# Patient Record
Sex: Male | Born: 1950 | Race: White | Hispanic: No | Smoking: Former smoker
Health system: Southern US, Community
[De-identification: ages and names within clinical notes are randomized; demographics above are authoritative.]

## PROBLEM LIST (undated history)

## (undated) DIAGNOSIS — M109 Gout, unspecified: Secondary | ICD-10-CM

## (undated) DIAGNOSIS — E559 Vitamin D deficiency, unspecified: Secondary | ICD-10-CM

## (undated) DIAGNOSIS — G629 Polyneuropathy, unspecified: Secondary | ICD-10-CM

## (undated) DIAGNOSIS — E1140 Type 2 diabetes mellitus with diabetic neuropathy, unspecified: Secondary | ICD-10-CM

## (undated) DIAGNOSIS — K5790 Diverticulosis of intestine, part unspecified, without perforation or abscess without bleeding: Secondary | ICD-10-CM

## (undated) DIAGNOSIS — I2510 Atherosclerotic heart disease of native coronary artery without angina pectoris: Secondary | ICD-10-CM

## (undated) DIAGNOSIS — E119 Type 2 diabetes mellitus without complications: Secondary | ICD-10-CM

## (undated) DIAGNOSIS — N189 Chronic kidney disease, unspecified: Secondary | ICD-10-CM

## (undated) DIAGNOSIS — E7800 Pure hypercholesterolemia, unspecified: Secondary | ICD-10-CM

## (undated) DIAGNOSIS — R011 Cardiac murmur, unspecified: Secondary | ICD-10-CM

## (undated) DIAGNOSIS — E1142 Type 2 diabetes mellitus with diabetic polyneuropathy: Secondary | ICD-10-CM

## (undated) DIAGNOSIS — N320 Bladder-neck obstruction: Secondary | ICD-10-CM

## (undated) DIAGNOSIS — E785 Hyperlipidemia, unspecified: Secondary | ICD-10-CM

## (undated) DIAGNOSIS — D369 Benign neoplasm, unspecified site: Secondary | ICD-10-CM

## (undated) DIAGNOSIS — I10 Essential (primary) hypertension: Secondary | ICD-10-CM

## (undated) DIAGNOSIS — E781 Pure hyperglyceridemia: Secondary | ICD-10-CM

## (undated) HISTORY — PX: CATARACT EXTRACTION, BILATERAL: SHX1313

## (undated) HISTORY — PX: KNEE ARTHROSCOPY: SHX127

---

## 2010-12-15 ENCOUNTER — Emergency Department: Payer: Self-pay | Admitting: Emergency Medicine

## 2010-12-16 ENCOUNTER — Ambulatory Visit: Payer: Self-pay | Admitting: Oncology

## 2010-12-16 ENCOUNTER — Inpatient Hospital Stay: Payer: Self-pay | Admitting: Internal Medicine

## 2010-12-17 LAB — AFP TUMOR MARKER: AFP-Tumor Marker: <0.7 ng/mL

## 2010-12-17 LAB — CEA: CEA: 3.1 ng/mL

## 2010-12-19 LAB — CANCER ANTIGEN 19-9: CA 19-9: 8 U/mL (ref 0–35)

## 2010-12-31 ENCOUNTER — Ambulatory Visit: Payer: Self-pay | Admitting: Oncology

## 2011-01-02 ENCOUNTER — Other Ambulatory Visit: Payer: Self-pay | Admitting: Family Medicine

## 2011-01-03 ENCOUNTER — Ambulatory Visit: Payer: Self-pay | Admitting: Specialist

## 2011-01-17 ENCOUNTER — Ambulatory Visit: Payer: Self-pay | Admitting: Specialist

## 2011-02-15 ENCOUNTER — Ambulatory Visit: Payer: Self-pay | Admitting: Specialist

## 2011-03-27 ENCOUNTER — Ambulatory Visit: Payer: Self-pay | Admitting: Specialist

## 2012-07-19 ENCOUNTER — Ambulatory Visit: Payer: Self-pay | Admitting: Internal Medicine

## 2012-07-30 ENCOUNTER — Ambulatory Visit: Payer: Self-pay | Admitting: Family Medicine

## 2012-08-29 DIAGNOSIS — Z951 Presence of aortocoronary bypass graft: Secondary | ICD-10-CM

## 2012-08-29 HISTORY — DX: Presence of aortocoronary bypass graft: Z95.1

## 2012-08-29 HISTORY — PX: CORONARY ARTERY BYPASS GRAFT: SHX141

## 2012-09-17 ENCOUNTER — Ambulatory Visit: Payer: Self-pay | Admitting: Internal Medicine

## 2012-09-17 DIAGNOSIS — I2510 Atherosclerotic heart disease of native coronary artery without angina pectoris: Secondary | ICD-10-CM | POA: Insufficient documentation

## 2012-09-17 DIAGNOSIS — E782 Mixed hyperlipidemia: Secondary | ICD-10-CM | POA: Insufficient documentation

## 2012-09-17 LAB — GLUCOSE, RANDOM: Glucose: 402 mg/dL — ABNORMAL HIGH (ref 65–99)

## 2012-10-08 ENCOUNTER — Encounter: Payer: Self-pay | Admitting: Internal Medicine

## 2012-10-29 ENCOUNTER — Encounter: Payer: Self-pay | Admitting: Internal Medicine

## 2014-09-14 DIAGNOSIS — I10 Essential (primary) hypertension: Secondary | ICD-10-CM | POA: Insufficient documentation

## 2015-04-15 ENCOUNTER — Other Ambulatory Visit: Payer: Self-pay | Admitting: Physician Assistant

## 2015-05-31 ENCOUNTER — Inpatient Hospital Stay
Admission: AD | Admit: 2015-05-31 | Discharge: 2015-06-03 | DRG: 617 | Disposition: A | Discharge status: Home / Self Care | Payer: Managed Care, Other (non HMO) | Source: Ambulatory Visit | Attending: Specialist | Admitting: Specialist

## 2015-05-31 ENCOUNTER — Other Ambulatory Visit
Admission: RE | Admit: 2015-05-31 | Discharge: 2015-05-31 | Disposition: A | Discharge status: Home / Self Care | Payer: Managed Care, Other (non HMO) | Source: Ambulatory Visit | Attending: Podiatry | Admitting: Podiatry

## 2015-05-31 ENCOUNTER — Inpatient Hospital Stay: Payer: Managed Care, Other (non HMO)

## 2015-05-31 DIAGNOSIS — L97514 Non-pressure chronic ulcer of other part of right foot with necrosis of bone: Secondary | ICD-10-CM | POA: Diagnosis present

## 2015-05-31 DIAGNOSIS — Z9119 Patient's noncompliance with other medical treatment and regimen: Secondary | ICD-10-CM

## 2015-05-31 DIAGNOSIS — Z7982 Long term (current) use of aspirin: Secondary | ICD-10-CM

## 2015-05-31 DIAGNOSIS — Z951 Presence of aortocoronary bypass graft: Secondary | ICD-10-CM | POA: Diagnosis not present

## 2015-05-31 DIAGNOSIS — E1142 Type 2 diabetes mellitus with diabetic polyneuropathy: Secondary | ICD-10-CM | POA: Diagnosis present

## 2015-05-31 DIAGNOSIS — I10 Essential (primary) hypertension: Secondary | ICD-10-CM | POA: Diagnosis present

## 2015-05-31 DIAGNOSIS — Z794 Long term (current) use of insulin: Secondary | ICD-10-CM

## 2015-05-31 DIAGNOSIS — E1140 Type 2 diabetes mellitus with diabetic neuropathy, unspecified: Secondary | ICD-10-CM | POA: Diagnosis present

## 2015-05-31 DIAGNOSIS — E559 Vitamin D deficiency, unspecified: Secondary | ICD-10-CM | POA: Diagnosis present

## 2015-05-31 DIAGNOSIS — M868X7 Other osteomyelitis, ankle and foot: Secondary | ICD-10-CM | POA: Insufficient documentation

## 2015-05-31 DIAGNOSIS — M869 Osteomyelitis, unspecified: Secondary | ICD-10-CM

## 2015-05-31 DIAGNOSIS — E11621 Type 2 diabetes mellitus with foot ulcer: Secondary | ICD-10-CM | POA: Diagnosis present

## 2015-05-31 DIAGNOSIS — E785 Hyperlipidemia, unspecified: Secondary | ICD-10-CM | POA: Diagnosis present

## 2015-05-31 DIAGNOSIS — I2510 Atherosclerotic heart disease of native coronary artery without angina pectoris: Secondary | ICD-10-CM | POA: Diagnosis present

## 2015-05-31 DIAGNOSIS — Z833 Family history of diabetes mellitus: Secondary | ICD-10-CM | POA: Diagnosis not present

## 2015-05-31 DIAGNOSIS — Z8739 Personal history of other diseases of the musculoskeletal system and connective tissue: Secondary | ICD-10-CM | POA: Insufficient documentation

## 2015-05-31 DIAGNOSIS — Z79899 Other long term (current) drug therapy: Secondary | ICD-10-CM

## 2015-05-31 DIAGNOSIS — N1832 Chronic kidney disease, stage 3b: Secondary | ICD-10-CM | POA: Insufficient documentation

## 2015-05-31 DIAGNOSIS — E1169 Type 2 diabetes mellitus with other specified complication: Principal | ICD-10-CM | POA: Diagnosis present

## 2015-05-31 DIAGNOSIS — L97519 Non-pressure chronic ulcer of other part of right foot with unspecified severity: Secondary | ICD-10-CM | POA: Diagnosis present

## 2015-05-31 DIAGNOSIS — Z8249 Family history of ischemic heart disease and other diseases of the circulatory system: Secondary | ICD-10-CM | POA: Diagnosis not present

## 2015-05-31 HISTORY — DX: Atherosclerotic heart disease of native coronary artery without angina pectoris: I25.10

## 2015-05-31 HISTORY — DX: Essential (primary) hypertension: I10

## 2015-05-31 HISTORY — DX: Type 2 diabetes mellitus without complications: E11.9

## 2015-05-31 HISTORY — DX: Gout, unspecified: M10.9

## 2015-05-31 HISTORY — DX: Vitamin D deficiency, unspecified: E55.9

## 2015-05-31 HISTORY — DX: Hyperlipidemia, unspecified: E78.5

## 2015-05-31 HISTORY — DX: Personal history of other diseases of the musculoskeletal system and connective tissue: Z87.39

## 2015-05-31 HISTORY — DX: Type 2 diabetes mellitus with diabetic neuropathy, unspecified: E11.40

## 2015-05-31 HISTORY — DX: Osteomyelitis, unspecified: M86.9

## 2015-05-31 LAB — COMPREHENSIVE METABOLIC PANEL
ALT: 18 U/L (ref 17–63)
ANION GAP: 10 (ref 5–15)
AST: 20 U/L (ref 15–41)
Albumin: 4.2 g/dL (ref 3.5–5.0)
Alkaline Phosphatase: 76 U/L (ref 38–126)
BILIRUBIN TOTAL: 0.6 mg/dL (ref 0.3–1.2)
BUN: 18 mg/dL (ref 6–20)
CALCIUM: 9 mg/dL (ref 8.9–10.3)
CO2: 24 mmol/L (ref 22–32)
Chloride: 103 mmol/L (ref 101–111)
Creatinine, Ser: 1.34 mg/dL — ABNORMAL HIGH (ref 0.61–1.24)
GFR calc non Af Amer: 54 mL/min — ABNORMAL LOW (ref >60)
Glucose, Bld: 96 mg/dL (ref 65–99)
Potassium: 3.7 mmol/L (ref 3.5–5.1)
Sodium: 137 mmol/L (ref 135–145)
TOTAL PROTEIN: 8.2 g/dL — AB (ref 6.5–8.1)

## 2015-05-31 LAB — CBC
HCT: 36.2 % — ABNORMAL LOW (ref 40.0–52.0)
Hemoglobin: 12.2 g/dL — ABNORMAL LOW (ref 13.0–18.0)
MCH: 28.9 pg (ref 26.0–34.0)
MCHC: 33.7 g/dL (ref 32.0–36.0)
MCV: 85.7 fL (ref 80.0–100.0)
PLATELETS: 252 10*3/uL (ref 150–440)
RBC: 4.22 MIL/uL — AB (ref 4.40–5.90)
RDW: 13.6 % (ref 11.5–14.5)
WBC: 10.4 10*3/uL (ref 3.8–10.6)

## 2015-05-31 LAB — MRSA PCR SCREENING: MRSA BY PCR: NEGATIVE

## 2015-05-31 LAB — GLUCOSE, CAPILLARY
GLUCOSE-CAPILLARY: 97 mg/dL (ref 65–99)
GLUCOSE-CAPILLARY: 268 mg/dL — AB (ref 65–99)

## 2015-05-31 MED ORDER — TRAZODONE HCL 50 MG PO TABS
50.0000 mg | ORAL_TABLET | Freq: Every evening | ORAL | Status: DC | PRN
  Administered 2015-05-31 – 2015-06-01 (×2): 50 mg via ORAL
  Filled 2015-05-31 (×2): qty 1

## 2015-05-31 MED ORDER — DOCUSATE SODIUM 100 MG PO CAPS
100.0000 mg | ORAL_CAPSULE | Freq: Two times a day (BID) | ORAL | Status: DC
  Administered 2015-05-31 – 2015-06-03 (×6): 100 mg via ORAL
  Filled 2015-05-31 (×7): qty 1

## 2015-05-31 MED ORDER — PRAVASTATIN SODIUM 20 MG PO TABS
80.0000 mg | ORAL_TABLET | Freq: Every day | ORAL | Status: DC
  Administered 2015-05-31 – 2015-06-02 (×3): 80 mg via ORAL
  Filled 2015-05-31 (×3): qty 4

## 2015-05-31 MED ORDER — LISINOPRIL 5 MG PO TABS
5.0000 mg | ORAL_TABLET | Freq: Every day | ORAL | Status: DC
  Administered 2015-05-31 – 2015-06-03 (×4): 5 mg via ORAL
  Filled 2015-05-31 (×4): qty 1

## 2015-05-31 MED ORDER — ONDANSETRON HCL 4 MG/2ML IJ SOLN: 4.0000 mg | Freq: Four times a day (QID) | INTRAMUSCULAR | Status: DC | PRN

## 2015-05-31 MED ORDER — INSULIN ASPART 100 UNIT/ML ~~LOC~~ SOLN
0.0000 [IU] | Freq: Every day | SUBCUTANEOUS | Status: DC
  Administered 2015-05-31 – 2015-06-02 (×3): 3 [IU] via SUBCUTANEOUS
  Filled 2015-05-31: qty 2
  Filled 2015-05-31 (×3): qty 3

## 2015-05-31 MED ORDER — PIPERACILLIN-TAZOBACTAM 3.375 G IVPB
3.3750 g | Freq: Three times a day (TID) | INTRAVENOUS | Status: DC
  Administered 2015-05-31 – 2015-06-03 (×8): 3.375 g via INTRAVENOUS
  Filled 2015-05-31 (×11): qty 50

## 2015-05-31 MED ORDER — VANCOMYCIN HCL 10 G IV SOLR
1250.0000 mg | Freq: Two times a day (BID) | INTRAVENOUS | Status: DC
  Administered 2015-06-01 – 2015-06-02 (×3): 1250 mg via INTRAVENOUS
  Filled 2015-05-31 (×4): qty 1250

## 2015-05-31 MED ORDER — VITAMIN D 1000 UNITS PO TABS
2000.0000 [IU] | ORAL_TABLET | Freq: Every day | ORAL | Status: DC
Start: 2015-05-31 — End: 2015-06-03
  Administered 2015-05-31 – 2015-06-03 (×3): 2000 [IU] via ORAL
  Filled 2015-05-31 (×4): qty 2

## 2015-05-31 MED ORDER — VANCOMYCIN HCL 10 G IV SOLR
1250.0000 mg | Freq: Once | INTRAVENOUS | Status: AC
  Administered 2015-06-01: 1250 mg via INTRAVENOUS
  Filled 2015-05-31: qty 1250

## 2015-05-31 MED ORDER — ONDANSETRON HCL 4 MG PO TABS: 4.0000 mg | ORAL_TABLET | Freq: Four times a day (QID) | ORAL | Status: DC | PRN

## 2015-05-31 MED ORDER — INSULIN GLARGINE 100 UNIT/ML ~~LOC~~ SOLN
26.0000 [IU] | Freq: Every day | SUBCUTANEOUS | Status: DC
  Administered 2015-05-31 – 2015-06-01 (×2): 26 [IU] via SUBCUTANEOUS
  Filled 2015-05-31 (×3): qty 0.26

## 2015-05-31 MED ORDER — VANCOMYCIN HCL IN DEXTROSE 1-5 GM/200ML-% IV SOLN
1000.0000 mg | Freq: Once | INTRAVENOUS | Status: AC
  Administered 2015-05-31: 1000 mg via INTRAVENOUS
  Filled 2015-05-31: qty 200

## 2015-05-31 MED ORDER — HYDROCODONE-ACETAMINOPHEN 5-325 MG PO TABS
1.0000 | ORAL_TABLET | ORAL | Status: DC | PRN
  Administered 2015-06-01 (×2): 1 via ORAL
  Administered 2015-06-02 (×2): 2 via ORAL
  Administered 2015-06-02: 1 via ORAL
  Administered 2015-06-02 (×2): 2 via ORAL
  Filled 2015-05-31: qty 1
  Filled 2015-05-31 (×3): qty 2
  Filled 2015-05-31 (×2): qty 1
  Filled 2015-05-31: qty 2

## 2015-05-31 MED ORDER — ACETAMINOPHEN 325 MG PO TABS: 650.0000 mg | ORAL_TABLET | Freq: Four times a day (QID) | ORAL | Status: DC | PRN

## 2015-05-31 MED ORDER — POLYETHYLENE GLYCOL 3350 17 G PO PACK
17.0000 g | PACK | Freq: Every day | ORAL | Status: DC | PRN
  Administered 2015-06-02: 17 g via ORAL
  Filled 2015-05-31: qty 1

## 2015-05-31 MED ORDER — HEPARIN SODIUM (PORCINE) 5000 UNIT/ML IJ SOLN
5000.0000 [IU] | Freq: Three times a day (TID) | INTRAMUSCULAR | Status: DC
  Administered 2015-05-31: 5000 [IU] via SUBCUTANEOUS
  Filled 2015-05-31: qty 1

## 2015-05-31 MED ORDER — ACETAMINOPHEN 650 MG RE SUPP: 650.0000 mg | Freq: Four times a day (QID) | RECTAL | Status: DC | PRN

## 2015-05-31 MED ORDER — INSULIN ASPART 100 UNIT/ML ~~LOC~~ SOLN
0.0000 [IU] | Freq: Three times a day (TID) | SUBCUTANEOUS | Status: DC
  Administered 2015-06-01 – 2015-06-02 (×4): 3 [IU] via SUBCUTANEOUS
  Administered 2015-06-02: 2 [IU] via SUBCUTANEOUS
  Administered 2015-06-03: 3 [IU] via SUBCUTANEOUS
  Filled 2015-05-31 (×2): qty 3
  Filled 2015-05-31: qty 5
  Filled 2015-05-31: qty 3

## 2015-05-31 MED ORDER — GABAPENTIN 300 MG PO CAPS
300.0000 mg | ORAL_CAPSULE | Freq: Three times a day (TID) | ORAL | Status: DC
  Administered 2015-05-31 – 2015-06-03 (×6): 300 mg via ORAL
  Filled 2015-05-31 (×6): qty 1

## 2015-05-31 NOTE — Progress Notes (Signed)
Spoke with Dr. Elvina Mattes because Dr. Tressia Miners ordered a wound culture and the patient states that Dr. Elvina Mattes cultured his foot today in the clinic. Dr. Elvina Mattes confirmed this and states order for culture should be discontinued.

## 2015-05-31 NOTE — H&P (Signed)
[place] [facility] [place] NAME: Andrew Harding    MR#:  [id number]  [place] OF BIRTH:  [birth year]  DATE OF ADMISSION:  05/31/2015  PRIMARY CARE PHYSICIAN: Tama High III, MD   REQUESTING/REFERRING PHYSICIAN: Dr. Albertine Patricia  CHIEF COMPLAINT:   Right foot ulcer  HISTORY OF PRESENT ILLNESS:  Andrew Harding  is a 65 y.o. male with a known history of insulin-dependent diabetes mellitus, coronary artery disease status post CABG, peripheral neuropathy, hypertension presents from podiatry office secondary to nonhealing medial right first toe ulcer. Patient states that initially it started as a blister several months ago, he was treated with outpatient antibiotics and slowly the toe was swollen and now the blister ruptured and he has a deep puncture wound. He has been seeing podiatry frequently for that, however it was not healing. Denies any fevers or chills. No discharge coming out from the wound. He does have minimal pain but cannot feel much pain due to neuropathy. He was also evaluated by vascular and his ankle-brachial indexes were fine recently. X-ray done at the office today showed possible osteomyelitis and so patient was sent for admission.  PAST MEDICAL HISTORY:   Past Medical History  Diagnosis Date  . Diabetes mellitus without complication ([place])   . Coronary artery disease   . Diabetic neuropathy ([place])   . Hypertension   . Gout   . Hyperlipidemia   . Vitamin D deficiency     PAST SURGICAL HISTORY:   Past Surgical History  Procedure Laterality Date  . Coronary artery bypass graft    . Knee arthroscopy      Left knee   . Cataract extraction, bilateral      SOCIAL HISTORY:   Social History  Substance Use Topics  . Smoking status: Never Smoker   . Smokeless tobacco: Not on file  . Alcohol Use: No    FAMILY HISTORY:   Family History  Problem Relation Age of Onset  . Diabetes Mellitus II Mother   . CAD Mother     DRUG  ALLERGIES:  No Known Allergies  REVIEW OF SYSTEMS:   Review of Systems  Constitutional: Negative for fever, chills, weight loss and malaise/fatigue.  HENT: Negative for ear discharge, ear pain, nosebleeds and tinnitus.   Eyes: Negative for blurred vision, double vision and photophobia.  Respiratory: Negative for cough, hemoptysis, shortness of breath and wheezing.   Cardiovascular: Negative for chest pain, palpitations, orthopnea and leg swelling.  Gastrointestinal: Negative for heartburn, nausea, vomiting, abdominal pain, diarrhea, constipation and melena.  Genitourinary: Negative for dysuria, urgency, frequency and hematuria.  Musculoskeletal: Positive for joint pain. Negative for myalgias, back pain and neck pain.  Skin: Positive for rash.       Right medial first toe ulcer  Neurological: Positive for sensory change. Negative for dizziness, tremors, speech change, focal weakness and headaches.       Bilateral peripheral neuropathy present in feet.  Endo/Heme/Allergies: Does not bruise/bleed easily.  Psychiatric/Behavioral: Negative for depression.    MEDICATIONS AT HOME:   Prior to Admission medications   Medication Sig Start Date End Date Taking? Authorizing Provider  aspirin EC 81 MG tablet Take 81 mg by mouth daily.   Yes Historical Provider, MD  cholecalciferol (VITAMIN D) 1000 units tablet Take 2,000 Units by mouth daily.   Yes Historical Provider, MD  gabapentin (NEURONTIN) 300 MG capsule Take 300 mg by mouth 3 (three) times daily.   Yes Historical Provider, MD  insulin glargine (LANTUS) 100 UNIT/ML injection Inject 52 Units into the skin at bedtime.   Yes Historical Provider, MD  insulin lispro (HUMALOG) 100 UNIT/ML injection Inject 15-20 Units into the skin 3 (three) times daily with meals. Pt uses 15 units with breakfast, 18 units with lunch, and 20 units with dinner.   Yes Historical Provider, MD  lisinopril (PRINIVIL,ZESTRIL) 5 MG tablet Take 5 mg by mouth daily.   Yes  Historical Provider, MD  pravastatin (PRAVACHOL) 80 MG tablet Take 80 mg by mouth at bedtime.   Yes Historical Provider, MD  SitaGLIPtin-MetFORMIN HCl (JANUMET XR) 50-500 MG TB24 Take 1 tablet by mouth daily with supper.   Yes Historical Provider, MD  traZODone (DESYREL) 50 MG tablet Take 50 mg by mouth at bedtime as needed for sleep.   Yes Historical Provider, MD      VITAL SIGNS:  Blood pressure 129/67, pulse 71, temperature 98 F (36.7 C), temperature source Oral, resp. rate 18, SpO2 99 %.  PHYSICAL EXAMINATION:   Physical Exam  GENERAL:  65 y.o.-year-old patient lying in the bed with no acute distress.  EYES: Pupils equal, round, reactive to light and accommodation. No scleral icterus. Extraocular muscles intact.  HEENT: Head atraumatic, normocephalic. Oropharynx and nasopharynx clear.  NECK:  Supple, no jugular venous distention. No thyroid enlargement, no tenderness.  LUNGS: Normal breath sounds bilaterally, no wheezing, rales,rhonchi or crepitation. No use of accessory muscles of respiration.  CARDIOVASCULAR: S1, S2 normal. No rubs, or gallops. 3/6 systolic murmur is present ABDOMEN: Soft, nontender, nondistended. Bowel sounds present. No organomegaly or mass.  EXTREMITIES: No pedal edema, cyanosis, or clubbing. 1+ dorsalis pedis pulses palpable bilaterally. Right great 2 is swollen, much larger, slight erythema present. On the medial side of the first toe there is a deep puncture wound. No active discharge noted. NEUROLOGIC: Cranial nerves II through XII are intact. Muscle strength 5/5 in all extremities. Sensation intact. Gait not checked.  PSYCHIATRIC: The patient is alert and oriented x 3.  SKIN: No obvious rash, lesion, or ulcer.   LABORATORY PANEL:   CBC  Recent Labs Lab 05/31/15 1543  WBC 10.4  HGB 12.2*  HCT 36.2*  PLT 252   ------------------------------------------------------------------------------------------------------------------  Chemistries    Recent Labs Lab 05/31/15 1543  NA 137  K 3.7  CL 103  CO2 24  GLUCOSE 96  BUN 18  CREATININE 1.34*  CALCIUM 9.0  AST 20  ALT 18  ALKPHOS 76  BILITOT 0.6   ------------------------------------------------------------------------------------------------------------------  Cardiac Enzymes No results for input(s): TROPONINI in the last 168 hours. ------------------------------------------------------------------------------------------------------------------  RADIOLOGY:  Mr Foot Right Wo Contrast  05/31/2015  CLINICAL DATA:  Nonhealing ulcer on the right great toe. Osteomyelitis. EXAM: MRI OF THE RIGHT FOOT WITHOUT CONTRAST TECHNIQUE: Multiplanar, multisequence MR imaging was performed. No intravenous contrast was administered. COMPARISON:  None. FINDINGS: There is destruction of the proximal and distal phalanges of the great toe at the IP joint. There is abnormal edema throughout both phalangeal bones of the great toe consistent with osteomyelitis. Small effusion at the IP joint of the great toe. The other bones of the foot appear normal. No appreciable soft tissue abscess. Slight tenosynovitis involving the posterior tibialis and flexor digitorum longus and flexor hallucis longus tendons. IMPRESSION: Osteomyelitis of the proximal and distal phalangeal bones of the great toe. Electronically Signed   By: Lorriane Shire M.D.   On: 05/31/2015 17:42    EKG:   Orders placed or performed in visit on 12/16/10  .  EKG 12-Lead    IMPRESSION AND PLAN:   Andrew Harding  is a 65 y.o. male with a known history of insulin-dependent diabetes mellitus, coronary artery disease status post CABG, peripheral neuropathy, hypertension presents from podiatry office secondary to nonhealing medial right first toe ulcer.  #1 Right toe osteomyelitis- admitted from podiatry office,  -MRI of the foot ordered. Podiatry and ID consults. -Possible debridement tomorrow. Continue broad-spectrum antibiotics with  Vanco and Zosyn for now. -Might need PICC line and long-term antibiotics. - Dressing changes per podiatry  #2 Diabetes mellitus -sugars were on lower side, cut lantus to half dose tonight as will be NPO - cont SSI  #3 CAD status post CABG-4 years ago, no active chest pain. Very stable at this time. -Hold aspirin for his surgery tomorrow. Continue statin.  #4 diabetic neuropathy-continue gabapentin  #5 DVT prophylaxis-started on subcutaneous heparin, but discontinue for his surgery tomorrow    All the records are reviewed and case discussed with ED provider. Management plans discussed with the patient, family and they are in agreement.  CODE STATUS: Full code  TOTAL TIME TAKING CARE OF THIS PATIENT: 50 minutes.    Mathis Cashman M.D on 05/31/2015 at 7:26 PM  Between 7am to 6pm - Pager - [phone]  After 6pm go to [email or web address] - password [email or web address] [facility]  [facility]  Office  [phone]  CC: Primary care physician; Adin Hector, MD

## 2015-05-31 NOTE — Progress Notes (Signed)
ANTIBIOTIC CONSULT NOTE - INITIAL  Pharmacy Consult for Vancomycin/Zosyn Indication: osteomyelitis  No Known Allergies  Patient Measurements:   Adjusted Body Weight: 96 kg  Vital Signs: Temp: 98 F (36.7 C) (01/30 1542) Temp Source: Oral (01/30 1542) BP: 129/67 mmHg (01/30 1542) Pulse Rate: 71 (01/30 1542) Intake/Output from previous day:   Intake/Output from this shift:    Labs:  Recent Labs  05/31/15 1543  WBC 10.4  HGB 12.2*  PLT 252  CREATININE 1.34*   CrCl cannot be calculated (Unknown ideal weight.). No results for input(s): VANCOTROUGH, VANCOPEAK, VANCORANDOM, GENTTROUGH, GENTPEAK, GENTRANDOM, TOBRATROUGH, TOBRAPEAK, TOBRARND, AMIKACINPEAK, AMIKACINTROU, AMIKACIN in the last 72 hours.   Microbiology: Recent Results (from the past 720 hour(s))  MRSA PCR Screening     Status: None   Collection Time: 05/31/15  4:30 PM  Result Value Ref Range Status   MRSA by PCR NEGATIVE NEGATIVE Final    Comment:        The GeneXpert MRSA Assay (FDA approved for NASAL specimens only), is one component of a comprehensive MRSA colonization surveillance program. It is not intended to diagnose MRSA infection nor to guide or monitor treatment for MRSA infections.     Medical History: Past Medical History  Diagnosis Date  . Diabetes mellitus without complication ([facility])     Medications:  Scheduled:  . cholecalciferol  2,000 Units Oral Daily  . docusate sodium  100 mg Oral BID  . gabapentin  300 mg Oral TID  . heparin  5,000 Units Subcutaneous 3 times per day  . insulin aspart  0-5 Units Subcutaneous QHS  . [START ON 06/01/2015] insulin aspart  0-9 Units Subcutaneous TID WC  . insulin glargine  26 Units Subcutaneous QHS  . lisinopril  5 mg Oral Daily  . piperacillin-tazobactam (ZOSYN)  IV  3.375 g Intravenous 3 times per day  . pravastatin  80 mg Oral QHS  . [START ON 06/01/2015] vancomycin  1,250 mg Intravenous Once  . [START ON 06/01/2015] vancomycin  1,250 mg  Intravenous Q12H  . vancomycin  1,000 mg Intravenous Once   Infusions:   Assessment: Pharmacy consulted to dose Vancomycin and Zosyn in a 65 yo male admitted with osteomyelitis.  Plan to surgically excise infected bone tomorrow AM.   Patient received Vancomycin 1 gm IV once at 1850.   SCr: 1.34, est CrCl~74.7  ML/min (Ht obtained from care everywhere=75 in), ke: 0.066, t1/2: 10.5 h, Vd: 67 kg  Goal of Therapy:  Vancomycin trough level 15-20 mcg/ml  Plan:  Will order Vancomycin 1250 mg IV once to be given 6 hours after first dose for stacked dosing.  Will then start patient on Vancomycin 1250 mg IV q12h for maintenance dosing.  Will order trough prior to 4th dose on 2/1 at 1230 (should be at steady state).  Will order Zosyn 3.375 gm IV q8h per EI protocol based on renal function.  Pharmacy will continue to follow.   Tangee Marszalek G 05/31/2015,7:11 PM

## 2015-05-31 NOTE — Progress Notes (Signed)
Gone for MRI   Swab for MRSA nares sent

## 2015-05-31 NOTE — Consult Note (Signed)
Patient Demographics  Andrew Harding, is a 65 y.o. male   MRN: [id number]   DOB - Feb 02, 1951  Admit Date - 05/31/2015    Outpatient Primary MD for the patient is Shavano Park, MD  Consult requested in the Hospital by Gladstone Lighter, MD, On 05/31/2015    Reason for consult chronic diabetic ulceration with underlying degenerative changes to the interphalangeal joint proximal and distal phalanx right great toe suggestive of osteomyelitis With History of -  Past Medical History  Diagnosis Date  . Diabetes mellitus without complication [facility])       Past Surgical History  Procedure Laterality Date  . Coronary artery bypass graft      in for   No chief complaint on file.    HPI  Andrew Harding  is a 65 y.o. male, he's had a chronic diabetic ulceration on his right great toe for about 4-5 months. We treated on an outpatient basis but by his own admission has been noncompliant with wearing appropriate offloading devices and subsequently ulcer has worsened. He presented to the office today with drainage from the wound crepitus range of motion. X-rays showed severe degenerative changes and fracture and likely osteomyelitis to the IP joint of the right great toe    Social History Social History  Substance Use Topics  . Smoking status: Never Smoker   . Smokeless tobacco: Not on file  . Alcohol Use: No     Family History History reviewed. No pertinent family history.   Prior to Admission medications   Medication Sig Start Date End Date Taking? Authorizing Provider  aspirin EC 81 MG tablet Take 81 mg by mouth daily.   Yes Historical Provider, MD  cholecalciferol (VITAMIN D) 1000 units tablet Take 2,000 Units by mouth daily.   Yes Historical Provider, MD  gabapentin (NEURONTIN) 300 MG capsule Take 300 mg by mouth  3 (three) times daily.   Yes Historical Provider, MD  insulin glargine (LANTUS) 100 UNIT/ML injection Inject 52 Units into the skin at bedtime.   Yes Historical Provider, MD  insulin lispro (HUMALOG) 100 UNIT/ML injection Inject 15-20 Units into the skin 3 (three) times daily with meals. Pt uses 15 units with breakfast, 18 units with lunch, and 20 units with dinner.   Yes Historical Provider, MD  lisinopril (PRINIVIL,ZESTRIL) 5 MG tablet Take 5 mg by mouth daily.   Yes Historical Provider, MD  pravastatin (PRAVACHOL) 80 MG tablet Take 80 mg by mouth at bedtime.   Yes Historical Provider, MD  SitaGLIPtin-MetFORMIN HCl (JANUMET XR) 50-500 MG TB24 Take 1 tablet by mouth daily with supper.   Yes Historical Provider, MD  traZODone (DESYREL) 50 MG tablet Take 50 mg by mouth at bedtime as needed for sleep.   Yes Historical Provider, MD    Anti-infectives    Start     Dose/Rate Route Frequency Ordered Stop   05/31/15 1600  piperacillin-tazobactam (ZOSYN) IVPB 3.375 g     3.375 g 12.5 mL/hr over 240 Minutes Intravenous 3 times per day 05/31/15 1553     05/31/15 1600  vancomycin (VANCOCIN) IVPB 1000 mg/200 mL premix     1,000 mg 200 mL/hr over 60 Minutes  Intravenous  Once 05/31/15 1553        Scheduled Meds: . docusate sodium  100 mg Oral BID  . heparin  5,000 Units Subcutaneous 3 times per day  . piperacillin-tazobactam (ZOSYN)  IV  3.375 g Intravenous 3 times per day  . vancomycin  1,000 mg Intravenous Once   Continuous Infusions:  PRN Meds:.acetaminophen **OR** acetaminophen, HYDROcodone-acetaminophen, ondansetron **OR** ondansetron (ZOFRAN) IV, polyethylene glycol  No Known Allergies  Physical Exam  Vitals  Blood pressure 129/67, pulse 71, temperature 98 F (36.7 C), temperature source Oral, resp. rate 18, SpO2 99 %.  Lower Extremity exam:  Vascular: DP and PT pulses are palpable at +1 over 4 bilateral. Recent ankle-brachial indices indicated normal ABIs at the ankle of 1.3. Patient  does have some calcifications evident on x-ray.  Dermatological: Ulceration plantar medial IPJ right hallux with drainage and breech all the way to the bone and joint level.  Neurological: Significant diabetic neuropathy bilaterally.  Ortho: Crepitus with range of motion the IPJ consistent with fractures and degenerative change to bone and cartilage of the IP joint the right hallux.  Data Review  CBC  Recent Labs Lab 05/31/15 1543  WBC 10.4  HGB 12.2*  HCT 36.2*  PLT 252  MCV 85.7  MCH 28.9  MCHC 33.7  RDW 13.6   ------------------------------------------------------------------------------------------------------------------  Chemistries   Recent Labs Lab 05/31/15 1543  NA 137  K 3.7  CL 103  CO2 24  GLUCOSE 96  BUN 18  CREATININE 1.34*  CALCIUM 9.0  AST 20  ALT 18  ALKPHOS 76  BILITOT 0.6   ------------------------------------------------------------------------------------------------------------------ CrCl cannot be calculated (Unknown ideal weight.). ------------------------------------------------------------------------------------------------------------------ No results for input(s): TSH, T4TOTAL, T3FREE, THYROIDAB in the last 72 hours.  Invalid input(s): FREET3   Assessment & Plan: Likely osteomyelitis to the right hallux to chronic diabetic ulcer. Plan: MRI shows increased uptake of signal at the proximal phalanx head and distal phalanx base of the right great toe consistent with osteomyelitis. Plan to surgically excise this infected bone packed with antibiotic beads tomorrow. We'll keep him nothing by mouth tonight. Consent form ordered.  Principal Problem:   Diabetic osteomyelitis ([place]) Active Problems:   Osteomyelitis ([place])  Family Communication: Plan discussed with patient .   Perry Mount M.D on 05/31/2015 at 5:31 PM  Thank you for the consult, we will follow the patient with you in the Hospital.

## 2015-05-31 NOTE — Progress Notes (Signed)
Dr Elvina Mattes conversing with pt  Concerning planned surgery

## 2015-06-01 ENCOUNTER — Encounter
Admission: AD | Disposition: A | Discharge status: Home / Self Care | Payer: Self-pay | Source: Ambulatory Visit | Attending: Specialist

## 2015-06-01 ENCOUNTER — Encounter: Payer: Self-pay | Admitting: *Deleted

## 2015-06-01 ENCOUNTER — Inpatient Hospital Stay: Payer: Managed Care, Other (non HMO) | Admitting: Certified Registered Nurse Anesthetist

## 2015-06-01 HISTORY — PX: EXCISION PARTIAL PHALANX: SHX6617

## 2015-06-01 HISTORY — PX: AMPUTATION TOE: SHX6595

## 2015-06-01 LAB — GLUCOSE, CAPILLARY
GLUCOSE-CAPILLARY: 173 mg/dL — AB (ref 65–99)
GLUCOSE-CAPILLARY: 221 mg/dL — AB (ref 65–99)
Glucose-Capillary: 236 mg/dL — ABNORMAL HIGH (ref 65–99)
Glucose-Capillary: 160 mg/dL — ABNORMAL HIGH (ref 65–99)
Glucose-Capillary: 283 mg/dL — ABNORMAL HIGH (ref 65–99)

## 2015-06-01 LAB — CBC
HEMATOCRIT: 33.9 % — AB (ref 40.0–52.0)
HEMOGLOBIN: 11.6 g/dL — AB (ref 13.0–18.0)
MCH: 29.6 pg (ref 26.0–34.0)
MCHC: 34.2 g/dL (ref 32.0–36.0)
MCV: 86.4 fL (ref 80.0–100.0)
PLATELETS: 218 10*3/uL (ref 150–440)
RBC: 3.92 MIL/uL — AB (ref 4.40–5.90)
RDW: 13.9 % (ref 11.5–14.5)
WBC: 6.4 10*3/uL (ref 3.8–10.6)

## 2015-06-01 LAB — BASIC METABOLIC PANEL
ANION GAP: 8 (ref 5–15)
BUN: 15 mg/dL (ref 6–20)
CHLORIDE: 104 mmol/L (ref 101–111)
CO2: 22 mmol/L (ref 22–32)
Calcium: 8.4 mg/dL — ABNORMAL LOW (ref 8.9–10.3)
Creatinine, Ser: 1.42 mg/dL — ABNORMAL HIGH (ref 0.61–1.24)
GFR calc non Af Amer: 50 mL/min — ABNORMAL LOW (ref >60)
GFR, EST AFRICAN AMERICAN: 58 mL/min — AB (ref >60)
Glucose, Bld: 242 mg/dL — ABNORMAL HIGH (ref 65–99)
POTASSIUM: 4.1 mmol/L (ref 3.5–5.1)
SODIUM: 134 mmol/L — AB (ref 135–145)

## 2015-06-01 SURGERY — EXCISION, PHALANX, PARTIAL
Anesthesia: General | Laterality: Right

## 2015-06-01 MED ORDER — BUPIVACAINE HCL 0.5 % IJ SOLN
INTRAMUSCULAR | Status: DC | PRN
  Administered 2015-06-01: 10 mL

## 2015-06-01 MED ORDER — ONDANSETRON HCL 4 MG/2ML IJ SOLN
INTRAMUSCULAR | Status: DC | PRN
  Administered 2015-06-01: 4 mg via INTRAVENOUS

## 2015-06-01 MED ORDER — ACETAMINOPHEN 10 MG/ML IV SOLN
INTRAVENOUS | Status: DC | PRN
  Administered 2015-06-01: 1000 mg via INTRAVENOUS

## 2015-06-01 MED ORDER — MIDAZOLAM HCL 2 MG/2ML IJ SOLN
INTRAMUSCULAR | Status: DC | PRN
  Administered 2015-06-01: 2 mg via INTRAVENOUS

## 2015-06-01 MED ORDER — FENTANYL CITRATE (PF) 100 MCG/2ML IJ SOLN
INTRAMUSCULAR | Status: DC | PRN
  Administered 2015-06-01 (×3): 25 ug via INTRAVENOUS

## 2015-06-01 MED ORDER — BUPIVACAINE HCL (PF) 0.5 % IJ SOLN
INTRAMUSCULAR | Status: AC
  Filled 2015-06-01: qty 30

## 2015-06-01 MED ORDER — LACTATED RINGERS IV SOLN: INTRAVENOUS | Status: DC | PRN

## 2015-06-01 MED ORDER — ACETAMINOPHEN 10 MG/ML IV SOLN
INTRAVENOUS | Status: AC
  Filled 2015-06-01: qty 100

## 2015-06-01 MED ORDER — GLYCOPYRROLATE 0.2 MG/ML IJ SOLN
INTRAMUSCULAR | Status: DC | PRN
  Administered 2015-06-01: 0.2 mg via INTRAVENOUS

## 2015-06-01 MED ORDER — VANCOMYCIN HCL 1000 MG IV SOLR
INTRAVENOUS | Status: AC
  Filled 2015-06-01: qty 1000

## 2015-06-01 MED ORDER — ONDANSETRON HCL 4 MG/2ML IJ SOLN: 4.0000 mg | Freq: Once | INTRAMUSCULAR | Status: DC | PRN

## 2015-06-01 MED ORDER — EPHEDRINE SULFATE 50 MG/ML IJ SOLN
INTRAMUSCULAR | Status: DC | PRN
  Administered 2015-06-01 (×3): 5 mg via INTRAVENOUS

## 2015-06-01 MED ORDER — GENTAMICIN SULFATE 40 MG/ML IJ SOLN
INTRAMUSCULAR | Status: AC
  Filled 2015-06-01: qty 6

## 2015-06-01 MED ORDER — FENTANYL CITRATE (PF) 100 MCG/2ML IJ SOLN: 25.0000 ug | INTRAMUSCULAR | Status: DC | PRN

## 2015-06-01 MED ORDER — ENOXAPARIN SODIUM 40 MG/0.4ML ~~LOC~~ SOLN
40.0000 mg | SUBCUTANEOUS | Status: DC
  Administered 2015-06-01 – 2015-06-02 (×2): 40 mg via SUBCUTANEOUS
  Filled 2015-06-01 (×2): qty 0.4

## 2015-06-01 MED ORDER — SODIUM CHLORIDE 0.9 % IV SOLN
INTRAVENOUS | Status: DC | PRN
  Administered 2015-06-01: 12:00:00 via INTRAVENOUS

## 2015-06-01 SURGICAL SUPPLY — 40 items
BANDAGE ELASTIC 4 CLIP NS LF (GAUZE/BANDAGES/DRESSINGS) ×2 IMPLANT
BANDAGE ELASTIC 4 LF NS (GAUZE/BANDAGES/DRESSINGS) ×2 IMPLANT
BANDAGE STRETCH 3X4.1 STRL (GAUZE/BANDAGES/DRESSINGS) ×2 IMPLANT
BLADE OSC/SAGITTAL 5.5X25 (BLADE) ×2 IMPLANT
BLADE SURG 15 STRL LF DISP TIS (BLADE) ×1 IMPLANT
BLADE SURG 15 STRL SS (BLADE) ×1
BNDG ESMARK 4X12 TAN STRL LF (GAUZE/BANDAGES/DRESSINGS) ×2 IMPLANT
BNDG GAUZE 4.5X4.1 6PLY STRL (MISCELLANEOUS) ×2 IMPLANT
CANISTER SUCT 1200ML W/VALVE (MISCELLANEOUS) ×2 IMPLANT
CANISTER SUCT 3000ML (MISCELLANEOUS) ×2 IMPLANT
CUFF TOURN 18 STER (MISCELLANEOUS) ×2 IMPLANT
CUFF TOURN DUAL PL 12 NO SLV (MISCELLANEOUS) IMPLANT
DRAPE FLUOR MINI C-ARM 54X84 (DRAPES) ×2 IMPLANT
DURAPREP 26ML APPLICATOR (WOUND CARE) ×2 IMPLANT
ELECT REM PT RETURN 9FT ADLT (ELECTROSURGICAL) ×2
ELECTRODE REM PT RTRN 9FT ADLT (ELECTROSURGICAL) ×1 IMPLANT
GAUZE PETRO XEROFOAM 1X8 (MISCELLANEOUS) ×2 IMPLANT
GAUZE SPONGE 4X4 12PLY STRL (GAUZE/BANDAGES/DRESSINGS) ×2 IMPLANT
GAUZE XEROFORM 4X4 STRL (GAUZE/BANDAGES/DRESSINGS) ×2 IMPLANT
GLOVE BIO SURGEON STRL SZ8 (GLOVE) ×2 IMPLANT
GLOVE INDICATOR 7.5 STRL GRN (GLOVE) IMPLANT
GOWN STRL REUS W/ TWL LRG LVL3 (GOWN DISPOSABLE) ×2 IMPLANT
GOWN STRL REUS W/TWL LRG LVL3 (GOWN DISPOSABLE) ×2
HANDPIECE VERSAJET DEBRIDEMENT (MISCELLANEOUS) ×2 IMPLANT
IV NS 1000ML (IV SOLUTION) ×1
IV NS 1000ML BAXH (IV SOLUTION) ×1 IMPLANT
KIT RM TURNOVER STRD PROC AR (KITS) ×2 IMPLANT
LABEL OR SOLS (LABEL) IMPLANT
NDL SAFETY 18GX1.5 (NEEDLE) ×2 IMPLANT
NEEDLE HYPO 25X1 1.5 SAFETY (NEEDLE) ×2 IMPLANT
NS IRRIG 500ML POUR BTL (IV SOLUTION) ×2 IMPLANT
PACK EXTREMITY ARMC (MISCELLANEOUS) ×2 IMPLANT
PENCIL ELECTRO HAND CTR (MISCELLANEOUS) ×2 IMPLANT
STOCKINETTE STRL 4IN 9604848 (GAUZE/BANDAGES/DRESSINGS) ×4 IMPLANT
STRIP CLOSURE SKIN 1/4X4 (GAUZE/BANDAGES/DRESSINGS) IMPLANT
SUT VIC AB 4-0 FS2 27 (SUTURE) ×2 IMPLANT
SUT VICRYL AB 3-0 FS1 BRD 27IN (SUTURE) ×2 IMPLANT
SYRINGE 10CC LL (SYRINGE) ×2 IMPLANT
WIRE Z .045 C-WIRE SPADE TIP (WIRE) IMPLANT
WIRE Z .062 C-WIRE SPADE TIP (WIRE) IMPLANT

## 2015-06-01 NOTE — Op Note (Signed)
Operative note   Surgeon: Dr. Albertine Patricia, DPM.    Assistant: None    Preop diagnosis: Osteomyelitis proximal and distal phalanges right great toe    Postop diagnosis: Same    Procedure:   1. Amputation right hallux to first MTPJ          EBL: 15 cc    Anesthesia:general with local block with Marcaine plain    Hemostasis: Ankle tourniquet 250 mmHg pressure for 9 minutes    Specimen: Amputated right great toe with osteomyelitis to the proximal and distal phalanx. Also bone specimen was sent for culture    Complications: None    Operative indications: Patient presented to the office yesterday with drainage and crepitus with range of motion to the right great toe x-rays indicated fracture and degenerative demineralized bone consistent with osteomyelitis. MRI had significant increase uptake to both the proximal phalanx and its entirety as well as the distal phalanx proximal two thirds    Procedure:  Patient was brought into the OR and placed on the operating table in thesupine position. After anesthesia was obtained theright lower extremity was prepped and draped in usual sterile fashion.  Operative Report: At this time, attention was directed to the right great toe. Initially the ulceration was addressed and purulence and drainage was seen oozing from this area. Incision was made approximately 2 cm along the medial side of the toe including the ulceration this was deepened down to the bone. This point was seen that the bone was broken up soft and de mineralized with purulence, pus, and infection to the both sides of the joint. A portion of this infected bone was removed and cultured for bone culture. At This time the bone was explored and the damaged bone extended to at least two thirds of the way to the base of the proximal phalanx consistent with the osteomyelitis. Infection was in the marrow canal extending all the way to the subchondral bone proximally. Distal phalanx also showed  significant damage and demineralization and pus. At this time I elected to amputate the hallux to the metatarsophalangeal joint. 2 semielliptical incisions were made over the joint dorsally then medal, then laterally and to the plantar surface. The incision was carried down to bone and the tendons both extensor and flexor tendons were incised as well as joint capsule. The toe was removed in toto and sent to pathology for evaluation. Bleeders were Clamped and bovied as required. The tourniquet was released and  checked for any kind of pulsatile flow. No pulsatile flow was noted be good overall bleeding occurred. The wound was copiously irrigated and 3-0 Vicryl was used to suture capsule tissue back over the head of the metatarsal. Deep Fascial layers were closed with 4 Vicryl in simple interrupted sutures. Skin was enclosed with 3-0 nylon combination of horizontal mattress and vertical mattress and simple interrupted sutures. The wounds copiously irrigated throughout closure. A sterile compressive dressing was placed across wound consisting of Steri-Strips Xeroform gauze 4 x 4's Kling Kerlix.    Patient tolerated the procedure and anesthesia well.  Was transported from the OR to the PACU with all vital signs stable and vascular status intact. To be discharged per routine protocol.  Will follow up in approximately 1 week in the outpatient clinic.

## 2015-06-01 NOTE — Anesthesia Preprocedure Evaluation (Addendum)
Anesthesia Evaluation  Patient identified by MRN, date of birth, ID band Patient awake    Reviewed: Allergy & Precautions, NPO status , Patient's Chart, lab work & pertinent test results  Airway Mallampati: II  TM Distance: >3 FB Neck ROM: Full    Dental  (+) Chipped   Pulmonary neg pulmonary ROS,    Pulmonary exam normal        Cardiovascular hypertension, Pt. on medications + CAD  Normal cardiovascular exam     Neuro/Psych Peripheral neuropathy negative psych ROS   GI/Hepatic negative GI ROS,   Endo/Other  diabetes, Well Controlled, Type 2, Insulin Dependent  Renal/GU   negative genitourinary   Musculoskeletal negative musculoskeletal ROS (+)   Abdominal Normal abdominal exam  (+)   Peds negative pediatric ROS (+)  Hematology negative hematology ROS (+)   Anesthesia Other Findings   Reproductive/Obstetrics                            Anesthesia Physical Anesthesia Plan  ASA: III  Anesthesia Plan: General   Post-op Pain Management:    Induction: Intravenous  Airway Management Planned: LMA  Additional Equipment:   Intra-op Plan:   Post-operative Plan:   Informed Consent: I have reviewed the patients History and Physical, chart, labs and discussed the procedure including the risks, benefits and alternatives for the proposed anesthesia with the patient or authorized representative who has indicated his/her understanding and acceptance.   Dental advisory given  Plan Discussed with: CRNA and Surgeon  Anesthesia Plan Comments:        Anesthesia Quick Evaluation

## 2015-06-01 NOTE — Consult Note (Signed)
[facility] Infectious Disease     Reason for Consult: DM foot infection   Referring Physician: Bobetta Lime Date of Admission:  05/31/2015   Principal Problem:   Diabetic osteomyelitis ([place]) Active Problems:   Osteomyelitis ([place])   HPI: Andrew Harding is a 65 y.o. male history of insulin-dependent diabetes mellitus (A1c 8.6) , coronary artery disease status post CABG, peripheral neuropathy, hypertension admitted with nonhealing medial right first toe ulcer. Initially started as a blister several months ago, treated with outpatient antibiotics, also evaluated by vascular and his ankle-brachial indexes were fine.  X-ray done at the office showed possible osteomyelitis and so patient admitted. MRI revealed osteo of prox and distal phalangeal bones of great toe.  Underwent amputation of R hallux to first MTPJ on 1/31.  Cx are pending. Currently on vanco and zosyn   Past Medical History  Diagnosis Date  . Diabetes mellitus without complication ([place])   . Coronary artery disease   . Diabetic neuropathy ([place])   . Hypertension   . Gout   . Hyperlipidemia   . Vitamin D deficiency    Past Surgical History  Procedure Laterality Date  . Coronary artery bypass graft    . Knee arthroscopy      Left knee   . Cataract extraction, bilateral     Social History  Substance Use Topics  . Smoking status: Never Smoker   . Smokeless tobacco: None  . Alcohol Use: No   Family History  Problem Relation Age of Onset  . Diabetes Mellitus II Mother   . CAD Mother     Allergies: No Known Allergies  Current antibiotics: Antibiotics Given (last 72 hours)    Date/Time Action Medication Dose Rate   05/31/15 1849 Given   piperacillin-tazobactam (ZOSYN) IVPB 3.375 g 3.375 g 12.5 mL/hr   05/31/15 1850 Given   vancomycin (VANCOCIN) IVPB 1000 mg/200 mL premix 1,000 mg 200 mL/hr   06/01/15 0053 Given   vancomycin (VANCOCIN) 1,250 mg in sodium chloride 0.9 % 250 mL IVPB 1,250 mg 166.7 mL/hr   06/01/15  0233 Given   piperacillin-tazobactam (ZOSYN) IVPB 3.375 g 3.375 g 12.5 mL/hr   06/01/15 1111 Given   piperacillin-tazobactam (ZOSYN) IVPB 3.375 g 3.375 g 12.5 mL/hr   06/01/15 1111 Given   vancomycin (VANCOCIN) 1,250 mg in sodium chloride 0.9 % 250 mL IVPB 1,250 mg 166.7 mL/hr      MEDICATIONS: . cholecalciferol  2,000 Units Oral Daily  . docusate sodium  100 mg Oral BID  . gabapentin  300 mg Oral TID  . insulin aspart  0-5 Units Subcutaneous QHS  . insulin aspart  0-9 Units Subcutaneous TID WC  . insulin glargine  26 Units Subcutaneous QHS  . lisinopril  5 mg Oral Daily  . piperacillin-tazobactam (ZOSYN)  IV  3.375 g Intravenous 3 times per day  . pravastatin  80 mg Oral QHS  . vancomycin  1,250 mg Intravenous Q12H    Review of Systems - 11 systems reviewed and negative per HPI   OBJECTIVE: Temp:  [97.3 F (36.3 C)-98.5 F (36.9 C)] 97.3 F (36.3 C) (01/31 1439) Pulse Rate:  [57-80] 58 (01/31 1439) Resp:  [13-19] 18 (01/31 1439) BP: (93-149)/(53-90) 111/58 mmHg (01/31 1439) SpO2:  [97 %-100 %] 99 % (01/31 1439) Weight:  [113.399 kg (250 lb)] 113.399 kg (250 lb) (01/30 1701) Physical Exam  Constitutional: He is oriented to person, place, and time. He appears well-developed and well-nourished. No distress.  HENT:  Mouth/Throat: Oropharynx  is clear and moist. No oropharyngeal exudate.  Cardiovascular: Normal rate, regular rhythm and normal heart sounds. Exam reveals no gallop and no friction rub.  No murmur heard.  Pulmonary/Chest: Effort normal and breath sounds normal. No respiratory distress. He has no wheezes.  Abdominal: Soft. Bowel sounds are normal. He exhibits no distension. There is no tenderness.  Lymphadenopathy: He has no cervical adenopathy.  Neurological: He is alert and oriented to person, place, and time.  Skin: R great toe wrapped post op Psychiatric: He has a normal mood and affect. His behavior is normal.     LABS: Results for orders placed or  performed during the hospital encounter of 05/31/15 (from the past 48 hour(s))  CBC     Status: Abnormal   Collection Time: 05/31/15  3:43 PM  Result Value Ref Range   WBC 10.4 3.8 - 10.6 K/uL   RBC 4.22 (L) 4.40 - 5.90 MIL/uL   Hemoglobin 12.2 (L) 13.0 - 18.0 g/dL   HCT 36.2 (L) 40.0 - 52.0 %   MCV 85.7 80.0 - 100.0 fL   MCH 28.9 26.0 - 34.0 pg   MCHC 33.7 32.0 - 36.0 g/dL   RDW 13.6 11.5 - 14.5 %   Platelets 252 150 - 440 K/uL  Comprehensive metabolic panel     Status: Abnormal   Collection Time: 05/31/15  3:43 PM  Result Value Ref Range   Sodium 137 135 - 145 mmol/L   Potassium 3.7 3.5 - 5.1 mmol/L   Chloride 103 101 - 111 mmol/L   CO2 24 22 - 32 mmol/L   Glucose, Bld 96 65 - 99 mg/dL   BUN 18 6 - 20 mg/dL   Creatinine, Ser 1.34 (H) 0.61 - 1.24 mg/dL   Calcium 9.0 8.9 - 10.3 mg/dL   Total Protein 8.2 (H) 6.5 - 8.1 g/dL   Albumin 4.2 3.5 - 5.0 g/dL   AST 20 15 - 41 U/L   ALT 18 17 - 63 U/L   Alkaline Phosphatase 76 38 - 126 U/L   Total Bilirubin 0.6 0.3 - 1.2 mg/dL   GFR calc non Af Amer 54 (L) >60 mL/min   GFR calc Af Amer >60 >60 mL/min    Comment: (NOTE) The eGFR has been calculated using the CKD EPI equation. This calculation has not been validated in all clinical situations. eGFR's persistently <60 mL/min signify possible Chronic Kidney Disease.    Anion gap 10 5 - 15  CULTURE, BLOOD (ROUTINE X 2) w Reflex to PCR ID Panel     Status: None (Preliminary result)   Collection Time: 05/31/15  3:43 PM  Result Value Ref Range   Specimen Description BLOOD LEFT ASSIST CONTROL    Special Requests BOTTLES DRAWN AEROBIC AND ANAEROBIC 4CCAERO,3CCANA    Culture NO GROWTH < 24 HOURS    Report Status PENDING   Glucose, capillary     Status: None   Collection Time: 05/31/15  3:45 PM  Result Value Ref Range   Glucose-Capillary 97 65 - 99 mg/dL   Comment 1 Notify RN   CULTURE, BLOOD (ROUTINE X 2) w Reflex to PCR ID Panel     Status: None (Preliminary result)   Collection Time:  05/31/15  3:52 PM  Result Value Ref Range   Specimen Description BLOOD RIGHT ASSIST CONTROL    Special Requests BOTTLES DRAWN AEROBIC AND ANAEROBIC 5CCAERO,3CCANA    Culture NO GROWTH < 24 HOURS    Report Status PENDING   MRSA PCR Screening  Status: None   Collection Time: 05/31/15  4:30 PM  Result Value Ref Range   MRSA by PCR NEGATIVE NEGATIVE    Comment:        The GeneXpert MRSA Assay (FDA approved for NASAL specimens only), is one component of a comprehensive MRSA colonization surveillance program. It is not intended to diagnose MRSA infection nor to guide or monitor treatment for MRSA infections.   Glucose, capillary     Status: Abnormal   Collection Time: 05/31/15  9:00 PM  Result Value Ref Range   Glucose-Capillary 268 (H) 65 - 99 mg/dL   Comment 1 Notify RN   Basic metabolic panel     Status: Abnormal   Collection Time: 06/01/15  6:07 AM  Result Value Ref Range   Sodium 134 (L) 135 - 145 mmol/L   Potassium 4.1 3.5 - 5.1 mmol/L   Chloride 104 101 - 111 mmol/L   CO2 22 22 - 32 mmol/L   Glucose, Bld 242 (H) 65 - 99 mg/dL   BUN 15 6 - 20 mg/dL   Creatinine, Ser 1.42 (H) 0.61 - 1.24 mg/dL   Calcium 8.4 (L) 8.9 - 10.3 mg/dL   GFR calc non Af Amer 50 (L) >60 mL/min   GFR calc Af Amer 58 (L) >60 mL/min    Comment: (NOTE) The eGFR has been calculated using the CKD EPI equation. This calculation has not been validated in all clinical situations. eGFR's persistently <60 mL/min signify possible Chronic Kidney Disease.    Anion gap 8 5 - 15  CBC     Status: Abnormal   Collection Time: 06/01/15  6:07 AM  Result Value Ref Range   WBC 6.4 3.8 - 10.6 K/uL   RBC 3.92 (L) 4.40 - 5.90 MIL/uL   Hemoglobin 11.6 (L) 13.0 - 18.0 g/dL   HCT 33.9 (L) 40.0 - 52.0 %   MCV 86.4 80.0 - 100.0 fL   MCH 29.6 26.0 - 34.0 pg   MCHC 34.2 32.0 - 36.0 g/dL   RDW 13.9 11.5 - 14.5 %   Platelets 218 150 - 440 K/uL  Glucose, capillary     Status: Abnormal   Collection Time: 06/01/15   7:22 AM  Result Value Ref Range   Glucose-Capillary 236 (H) 65 - 99 mg/dL   Comment 1 Notify RN   Glucose, capillary     Status: Abnormal   Collection Time: 06/01/15 11:25 AM  Result Value Ref Range   Glucose-Capillary 173 (H) 65 - 99 mg/dL  Glucose, capillary     Status: Abnormal   Collection Time: 06/01/15  1:23 PM  Result Value Ref Range   Glucose-Capillary 160 (H) 65 - 99 mg/dL   No components found for: ESR, C REACTIVE PROTEIN MICRO: Recent Results (from the past 720 hour(s))  Wound culture     Status: None (Preliminary result)   Collection Time: 05/31/15  2:15 PM  Result Value Ref Range Status   Specimen Description WOUND  Final   Special Requests NONE  Final   Gram Stain   Final    FEW WBC SEEN MODERATE RED BLOOD CELLS RARE GRAM NEGATIVE RODS    Culture PENDING  Incomplete   Report Status PENDING  Incomplete  CULTURE, BLOOD (ROUTINE X 2) w Reflex to PCR ID Panel     Status: None (Preliminary result)   Collection Time: 05/31/15  3:43 PM  Result Value Ref Range Status   Specimen Description BLOOD LEFT ASSIST CONTROL  Final  Special Requests BOTTLES DRAWN AEROBIC AND ANAEROBIC 4CCAERO,3CCANA  Final   Culture NO GROWTH < 24 HOURS  Final   Report Status PENDING  Incomplete  CULTURE, BLOOD (ROUTINE X 2) w Reflex to PCR ID Panel     Status: None (Preliminary result)   Collection Time: 05/31/15  3:52 PM  Result Value Ref Range Status   Specimen Description BLOOD RIGHT ASSIST CONTROL  Final   Special Requests BOTTLES DRAWN AEROBIC AND ANAEROBIC 5CCAERO,3CCANA  Final   Culture NO GROWTH < 24 HOURS  Final   Report Status PENDING  Incomplete  MRSA PCR Screening     Status: None   Collection Time: 05/31/15  4:30 PM  Result Value Ref Range Status   MRSA by PCR NEGATIVE NEGATIVE Final    Comment:        The GeneXpert MRSA Assay (FDA approved for NASAL specimens only), is one component of a comprehensive MRSA colonization surveillance program. It is not intended to  diagnose MRSA infection nor to guide or monitor treatment for MRSA infections.     IMAGING: Mr Foot Right Wo Contrast  05/31/2015  CLINICAL DATA:  Nonhealing ulcer on the right great toe. Osteomyelitis. EXAM: MRI OF THE RIGHT FOOT WITHOUT CONTRAST TECHNIQUE: Multiplanar, multisequence MR imaging was performed. No intravenous contrast was administered. COMPARISON:  None. FINDINGS: There is destruction of the proximal and distal phalanges of the great toe at the IP joint. There is abnormal edema throughout both phalangeal bones of the great toe consistent with osteomyelitis. Small effusion at the IP joint of the great toe. The other bones of the foot appear normal. No appreciable soft tissue abscess. Slight tenosynovitis involving the posterior tibialis and flexor digitorum longus and flexor hallucis longus tendons. IMPRESSION: Osteomyelitis of the proximal and distal phalangeal bones of the great toe. Electronically Signed   By: Lorriane Shire M.D.   On: 05/31/2015 17:42    Assessment:   JALIN ALICEA is a 65 y.o. male history of insulin-dependent diabetes mellitus (A1c 8.6) , coronary artery disease status post CABG, peripheral neuropathy, hypertension admitted with nonhealing medial right first toe ulcer. S/p Toe amputation 1/31. Cx pending.  MRI did not show extension of infection into foot except for slight tenosynovitis.  Recommendations Check esr/crp Cont IV abx for now Can treat orally based on cultures - likely doxy and cipro for a 2 week course.  Thank you very much for allowing me to participate in the care of this patient. Please call with questions.   Cheral Marker. Ola Spurr, MD

## 2015-06-01 NOTE — Progress Notes (Signed)
[place] [facility] [place] NAME: Andrew Harding    MR#:  [id number]  DATE OF BIRTH:  1950-07-02  SUBJECTIVE:   Pt. Here due to right great Toe Osteomyelitis.  Going to OR later today for amputation of right great toe.    REVIEW OF SYSTEMS:    Review of Systems  Constitutional: Negative for fever and chills.  HENT: Negative for congestion and tinnitus.   Eyes: Negative for blurred vision and double vision.  Respiratory: Negative for cough, shortness of breath and wheezing.   Cardiovascular: Negative for chest pain, orthopnea and PND.  Gastrointestinal: Negative for nausea, vomiting, abdominal pain and diarrhea.  Genitourinary: Negative for dysuria and hematuria.  Neurological: Negative for dizziness, sensory change and focal weakness.  All other systems reviewed and are negative.   Nutrition: Carb Control Tolerating Diet: Yes Tolerating PT:  Await Eval.    DRUG ALLERGIES:  No Known Allergies  VITALS:  Blood pressure 111/58, pulse 58, temperature 97.3 F (36.3 C), temperature source Axillary, resp. rate 18, height 6\' 3"  (1.905 m), SpO2 99 %.  PHYSICAL EXAMINATION:   Physical Exam  GENERAL:  65 y.o.-year-old patient lying in the bed with no acute distress.  EYES: Pupils equal, round, reactive to light and accommodation. No scleral icterus. Extraocular muscles intact.  HEENT: Head atraumatic, normocephalic. Oropharynx and nasopharynx clear.  NECK:  Supple, no jugular venous distention. No thyroid enlargement, no tenderness.  LUNGS: Normal breath sounds bilaterally, no wheezing, rales, rhonchi. No use of accessory muscles of respiration.  CARDIOVASCULAR: S1, S2 normal. No murmurs, rubs, or gallops.  ABDOMEN: Soft, nontender, nondistended. Bowel sounds present. No organomegaly or mass.  EXTREMITIES: No cyanosis, clubbing or edema b/l.  Right Great Toe swelling and dressing in place due to Osteomyelitis.  NEUROLOGIC: Cranial nerves II  through XII are intact. No focal Motor or sensory deficits b/l.   PSYCHIATRIC: The patient is alert and oriented x 3.  SKIN: No obvious rash, lesion, or ulcer.    LABORATORY PANEL:   CBC  Recent Labs Lab 06/01/15 0607  WBC 6.4  HGB 11.6*  HCT 33.9*  PLT 218   ------------------------------------------------------------------------------------------------------------------  Chemistries   Recent Labs Lab 05/31/15 1543 06/01/15 0607  NA 137 134*  K 3.7 4.1  CL 103 104  CO2 24 22  GLUCOSE 96 242*  BUN 18 15  CREATININE 1.34* 1.42*  CALCIUM 9.0 8.4*  AST 20  --   ALT 18  --   ALKPHOS 76  --   BILITOT 0.6  --    ------------------------------------------------------------------------------------------------------------------  Cardiac Enzymes No results for input(s): TROPONINI in the last 168 hours. ------------------------------------------------------------------------------------------------------------------  RADIOLOGY:  Mr Foot Right Wo Contrast  05/31/2015  CLINICAL DATA:  Nonhealing ulcer on the right great toe. Osteomyelitis. EXAM: MRI OF THE RIGHT FOOT WITHOUT CONTRAST TECHNIQUE: Multiplanar, multisequence MR imaging was performed. No intravenous contrast was administered. COMPARISON:  None. FINDINGS: There is destruction of the proximal and distal phalanges of the great toe at the IP joint. There is abnormal edema throughout both phalangeal bones of the great toe consistent with osteomyelitis. Small effusion at the IP joint of the great toe. The other bones of the foot appear normal. No appreciable soft tissue abscess. Slight tenosynovitis involving the posterior tibialis and flexor digitorum longus and flexor hallucis longus tendons. IMPRESSION: Osteomyelitis of the proximal and distal phalangeal bones of the great toe. Electronically Signed   By: Lorriane Shire M.D.   On: 05/31/2015  17:42     ASSESSMENT AND PLAN:   65 year old male with past medical history of  diabetes, hypertension, hyperlipidemia, diabetic neuropathy who presented to the hospital with a right great toe ulcer consistent with osteomyelitis.  #1 right great toe ulcer/osteomyelitis-this is the cause of patient's right toe pain swelling and redness. Patient has failed outpatient oral antibiotic therapy. Patient was admitted to the hospital and started on IV antibiotics with vancomycin, Zosyn. -Patient is to undergo right great toe amputation by podiatry today. -Continue further care as per podiatry. -An infectious disease consult has been placed by podiatry await further input from them.  #2 diabetes type 2 with neuropathy-continue Lantus, sliding scale insulin.  #3 hypertension-continue lisinopril.  #4 diabetic neuropathy-continue Neurontin.   All the records are reviewed and case discussed with Care Management/Social Workerr. Management plans discussed with the patient, family and they are in agreement.  CODE STATUS: Full code  DVT Prophylaxis: Lovenox  TOTAL TIME TAKING CARE OF THIS PATIENT: 30 minutes.   POSSIBLE D/C IN 1-2 DAYS, DEPENDING ON CLINICAL CONDITION.   Henreitta Leber M.D on 06/01/2015 at 3:15 PM  Between 7am to 6pm - Pager - [phone]  After 6pm go to [email or web address] - password [email or web address] [facility]  [place]  Office  [phone]  CC: Primary care physician; Adin Hector, MD

## 2015-06-01 NOTE — Care Management Note (Signed)
Case Management Note  Patient Details  Name: DREDEN RIVERE MRN: [id number] Date of Birth: 1950-12-07  Subjective/Objective:                  Met with patient and wife to discuss discharge planning. They expect to return home with PICC and IV ABX infusion which can be arranged through [facility] however, HHPT and dressing changes will have to go through [facility] which historically cannot meet patient's needs. Patient is aware that he may need to go to Dr. Selina Cooley office for dressing changes if it cannot be done in the home setting. He has appointment made with Dr Elvina Mattes for 06/07/15 at 3:30PM. He has many questions about "returning to work". He was advised to talk to Dr. Elvina Mattes regarding return to work.  Action/Plan: RNCM will continue to follow. [facility] aware of potential IV need at discharge.   Expected Discharge Date:                  Expected Discharge Plan:     In-House Referral:     Discharge planning Services  CM Consult  Post Acute Care Choice:  Home Health, Durable Medical Equipment Choice offered to:  Patient, Spouse  DME Arranged:    DME Agency:     HH Arranged:    [place] Agency:     Status of Service:  In process, will continue to follow  Medicare Important Message Given:    Date Medicare IM Given:    Medicare IM give by:    Date Additional Medicare IM Given:    Additional Medicare Important Message give by:     If discussed at [place] of Stay Meetings, dates discussed:    Additional Comments:  Colter Magowan, RN 06/01/2015, 10:38 AM

## 2015-06-01 NOTE — Anesthesia Procedure Notes (Signed)
Procedure Name: LMA Insertion Performed by: Demetrius Charity Pre-anesthesia Checklist: Patient identified, Patient being monitored, Timeout performed, Emergency Drugs available and Suction available Patient Re-evaluated:Patient Re-evaluated prior to inductionOxygen Delivery Method: Circle system utilized Preoxygenation: Pre-oxygenation with 100% oxygen Intubation Type: IV induction Ventilation: Mask ventilation without difficulty LMA: LMA inserted Tube type: Oral Tube size: 5.0 mm Number of attempts: 1 Placement Confirmation: positive ETCO2 and breath sounds checked- equal and bilateral Tube secured with: Tape Dental Injury: Teeth and Oropharynx as per pre-operative assessment

## 2015-06-01 NOTE — Transfer of Care (Signed)
Immediate Anesthesia Transfer of Care Note  [name]  Procedure(s) Performed: Procedure(s): EXCISION PARTIAL PHALANX /  BONE (Right) AMPUTATION TOE (Right)  Patient Location: PACU  Anesthesia Type:General  Level of Consciousness: sedated  Airway & Oxygen Therapy: Patient Spontanous Breathing and Patient connected to face mask oxygen  Post-op Assessment: Report given to RN and Post -op Vital signs reviewed and stable  Post vital signs: Reviewed and stable  Last Vitals:  Filed Vitals:   06/01/15 1124 06/01/15 1308  BP: 125/90 108/57  Pulse: 58 72  Temp: 36.9 C 36.7 C  Resp: 16 13    Complications: No apparent anesthesia complications

## 2015-06-01 NOTE — Progress Notes (Signed)
Dr troxler called  Re: activity level. md orders bedrest until eval in am

## 2015-06-01 NOTE — H&P (Signed)
H and P has been reviewed and no changes are noted.  

## 2015-06-02 LAB — GLUCOSE, CAPILLARY
GLUCOSE-CAPILLARY: 196 mg/dL — AB (ref 65–99)
GLUCOSE-CAPILLARY: 210 mg/dL — AB (ref 65–99)
Glucose-Capillary: 235 mg/dL — ABNORMAL HIGH (ref 65–99)
Glucose-Capillary: 244 mg/dL — ABNORMAL HIGH (ref 65–99)

## 2015-06-02 LAB — CREATININE, SERUM
CREATININE: 1.43 mg/dL — AB (ref 0.61–1.24)
GFR calc Af Amer: 58 mL/min — ABNORMAL LOW (ref >60)
GFR calc non Af Amer: 50 mL/min — ABNORMAL LOW (ref >60)

## 2015-06-02 LAB — VANCOMYCIN, TROUGH: VANCOMYCIN TR: 47 ug/mL — AB (ref 10–20)

## 2015-06-02 LAB — C-REACTIVE PROTEIN: CRP: 0.7 mg/dL (ref <1.0)

## 2015-06-02 LAB — SURGICAL PATHOLOGY

## 2015-06-02 LAB — SEDIMENTATION RATE: SED RATE: 54 mm/h — AB (ref 0–20)

## 2015-06-02 MED ORDER — INSULIN GLARGINE 100 UNIT/ML ~~LOC~~ SOLN
35.0000 [IU] | Freq: Every day | SUBCUTANEOUS | Status: DC
  Administered 2015-06-02: 35 [IU] via SUBCUTANEOUS
  Filled 2015-06-02 (×3): qty 0.35

## 2015-06-02 MED ORDER — MORPHINE SULFATE (PF) 2 MG/ML IV SOLN
2.0000 mg | Freq: Once | INTRAVENOUS | Status: AC
  Administered 2015-06-02: 2 mg via INTRAVENOUS
  Filled 2015-06-02: qty 1

## 2015-06-02 MED ORDER — MAGNESIUM HYDROXIDE 400 MG/5ML PO SUSP: 30.0000 mL | Freq: Every day | ORAL | Status: DC | PRN

## 2015-06-02 NOTE — Progress Notes (Signed)
Pt. Requested more pain meds. Dr. Jannifer Franklin put in new order.

## 2015-06-02 NOTE — Progress Notes (Signed)
ANTIBIOTIC CONSULT NOTE - FOLLOW UP  Pharmacy Consult for vancomycin and piperacillin/tazobactam Indication: OM  No Known Allergies  Patient Measurements: Adjusted Body Weight: 96 kg  Vital Signs: Temp: 98 F (36.7 C) (02/01 0735) Temp Source: Oral (02/01 0735) BP: 112/71 mmHg (02/01 0735) Pulse Rate: 60 (02/01 0735) Intake/Output from previous day: 01/31 0701 - 02/01 0700 In: 400 [I.V.:400] Out: 205 [Urine:200; Blood:5] Intake/Output from this shift: Total I/O In: 240 [P.O.:240] Out: 0   Labs:  Recent Labs  05/31/15 1543 06/01/15 0607 06/02/15 0656  WBC 10.4 6.4  --   HGB 12.2* 11.6*  --   PLT 252 218  --   CREATININE 1.34* 1.42* 1.43*   Estimated Creatinine Clearance: 70 mL/min (by C-G formula based on Cr of 1.43).  Recent Labs  06/02/15 1227  VANCOTROUGH 47*     Microbiology: Wound culture pending Blood culture (1/30) - no growth < 24 hours  Assessment: Patient is currently prescribed piperacillin/tazobactam 3.375 g IV q 8 hours EI and vancomycin 1250 mg IV q 12 hours.   Vancomycin trough scheduled for 1230 today was drawn at 1227 and resulted supratherapeutic at 47 mcg/mL. The next dose of vancomycin is charted as being given at 1252 this afternoon and scheduled to run over 90 minutes. Lab called regarding critical level at 1345. I spoke with RN at that time and the vancomycin dose was already done infusing, which makes me question documentation of timing of vancomycin trough in relation to vancomycin dose being given and raises concern that trough was drawn while dose was infusing.   Goal of Therapy:  Vancomycin trough level 15-20 mcg/ml  Plan:  Continue piperacillin/tazobactam 3.375 g IV q 8 hours EI  Discontinued vancomycin order for now and have ordered a vancomycin trough for 2/2 at 0030, which represents a 12 hour level to confirm elevated trough prior to adjusting maintenance dose since there is a possibility the vancomycin trough was drawn while  dose was infusing.   Pharmacy will continue to follow, thank you for allowing [place] to be involved in the care of this patient.  Lenis Noon, PharmD Clinical Pharmacist 06/02/2015,2:06 PM

## 2015-06-02 NOTE — Care Management (Signed)
I have notified Corene Cornea with [place] care that patient may need IV ABX- cultures pending.

## 2015-06-02 NOTE — Anesthesia Postprocedure Evaluation (Signed)
Anesthesia Post Note  [name]  Procedure(s) Performed: Procedure(s) (LRB): EXCISION PARTIAL PHALANX /  BONE (Right) AMPUTATION TOE (Right)  Patient location during evaluation: PACU Anesthesia Type: General Level of consciousness: awake and alert Pain management: pain level controlled Vital Signs Assessment: post-procedure vital signs reviewed and stable Respiratory status: spontaneous breathing, nonlabored ventilation, respiratory function stable and patient connected to nasal cannula oxygen Cardiovascular status: blood pressure returned to baseline and stable Postop Assessment: no signs of nausea or vomiting Anesthetic complications: no    Last Vitals:  Filed Vitals:   06/02/15 0309 06/02/15 0735  BP: 109/56 112/71  Pulse: 53 60  Temp: 36.7 C 36.7 C  Resp: 18     Last Pain:  Filed Vitals:   06/02/15 0735  PainSc: [place]

## 2015-06-02 NOTE — Evaluation (Signed)
Physical Therapy Evaluation Patient Details Name: Andrew Harding MRN: [id number] DOB: 12-Apr-1951 Today's Date: 06/02/2015   History of Present Illness  Patient is a pleasant 65 y/o male that presents with osteomyelitis of the great toe of the R foot, with subsequent amputation.   Clinical Impression  Patient has been quite active prior to this admission, and presents with balance deficits secondary to use of orthowedge shoe on RLE. He is able to navigate his home environment safely (steps and limited distance mobility), however he presents as quite impulsive, attempting at one point to transfer and ambulate without RW. Patient educated extensively on how delicate this operation is and how he should remain minimally ambulatory (with orthowedge on at all times while ambulating) for the next several weeks until cleared by podiatrist. Patient otherwise demonstrates appropriate balance and gait skills for safe return home with RW. Skilled PT services are indicated to address his current mobility deficits.    Follow Up Recommendations Home health PT;Supervision - Intermittent    Equipment Recommendations  Rolling walker with 5" wheels    Recommendations for Other Services       Precautions / Restrictions Precautions Precautions: Fall Restrictions Weight Bearing Restrictions: Yes Other Position/Activity Restrictions: Orthowedge shoe and RW for transfers and short distance ambulation.      Mobility  Bed Mobility Overal bed mobility: Independent             General bed mobility comments: No deficits noted in supine to sit transfer.   Transfers Overall transfer level: Modified independent Equipment used: Rolling walker (2 wheeled)             General transfer comment: Prolonged time and cuing for hand placement in transfer, though no loss of balance or deficit in speed noted.   Ambulation/Gait Ambulation/Gait assistance: Modified independent (Device/Increase time) Ambulation  Distance (Feet): 30 Feet Assistive device: Rolling walker (2 wheeled) Gait Pattern/deviations: Step-through pattern   Gait velocity interpretation: Below normal speed for age/gender General Gait Details: Patient is able to ambulate with Orthowedge shoe on to door and back with no loss of balance with RW. No buckling noted, reciprocal pattern.   Stairs Stairs: Yes Stairs assistance: Supervision Stair Management: Backwards;With walker Number of Stairs: 4 General stair comments: Patient required vc's and demonstration for backwards ascent with RW. Patient rather impulsive and appears to lose focus with which foot to ascend with, requiring additional cuing from PT. No loss of balance noted.   Wheelchair Mobility    Modified Rankin (Stroke Patients Only)       Balance Overall balance assessment: No apparent balance deficits (not formally assessed)                                           Pertinent Vitals/Pain Pain Assessment:  (Patient declines pain when asked both before and after ambulation.Marland Kitchen )    Home Living Family/patient expects to be discharged to:: Private residence Living Arrangements: Alone (Unclear if he would have assistance, he will require assistance for  groceries and cooking.)   Type of Home: House Home Access: Stairs to enter   [employer] of Steps: [place]: One level        Prior Function Level of Independence: Independent         Comments: Patient is very active at baseline with no ADs.      Hand Dominance  Extremity/Trunk Assessment   Upper Extremity Assessment: Overall WFL for tasks assessed           Lower Extremity Assessment: Overall WFL for tasks assessed         Communication   Communication: No difficulties  Cognition Arousal/Alertness: Awake/alert Behavior During Therapy: WFL for tasks assessed/performed;Anxious;Impulsive (Patient is very impulsive and even attempts to ambulate and  stand without RW, requiring PT to educate extensively on precuations with this operation. ) Overall Cognitive Status: Within Functional Limits for tasks assessed                      General Comments General comments (skin integrity, edema, etc.): Patient with dressing in tact over operative site.     Exercises        Assessment/Plan    PT Assessment Patient needs continued PT services  PT Diagnosis Difficulty walking   PT Problem List Decreased strength;Decreased knowledge of use of DME;Decreased safety awareness;Decreased balance;Decreased mobility  PT Treatment Interventions DME instruction;Gait training;Stair training;Therapeutic activities;Therapeutic exercise;Balance training   PT Goals (Current goals can be found in the Care Plan section) Acute Rehab PT Goals Patient Stated Goal: To return home and work as soon as possible.  PT Goal Formulation: With patient Time For Goal Achievement: 06/16/15 Potential to Achieve Goals: Good    Frequency 7X/week   Barriers to discharge Decreased caregiver support It sounds as though patient lives alone, he will need assistance with groceries and meal prep.     Co-evaluation               End of Session Equipment Utilized During Treatment: Gait belt Activity Tolerance: Patient tolerated treatment well Patient left: in chair;with call bell/phone within reach;with chair alarm set;with nursing/sitter in room Nurse Communication: Mobility status         Time: [id number] PT Time Calculation (min) (ACUTE ONLY): 22 min   Charges:   PT Evaluation $PT Eval Moderate Complexity: 1 Procedure     PT G Codes:       Kerman Passey, PT, DPT    06/02/2015, 6:29 PM

## 2015-06-02 NOTE — Progress Notes (Signed)
[place] [facility] [place] NAME: Andrew Harding    MR#:  [id number]  DATE OF BIRTH:  [birth year]  SUBJECTIVE:   Pt. Here due to right great Toe Osteomyelitis.  S/p Amputation of right hallus to first MTPJ POD # 1.  Doing well.  No complaints.   REVIEW OF SYSTEMS:    Review of Systems  Constitutional: Negative for fever and chills.  HENT: Negative for congestion and tinnitus.   Eyes: Negative for blurred vision and double vision.  Respiratory: Negative for cough, shortness of breath and wheezing.   Cardiovascular: Negative for chest pain, orthopnea and PND.  Gastrointestinal: Negative for nausea, vomiting, abdominal pain and diarrhea.  Genitourinary: Negative for dysuria and hematuria.  Neurological: Negative for dizziness, sensory change and focal weakness.  All other systems reviewed and are negative.   Nutrition: Carb Control Tolerating Diet: Yes Tolerating PT:  Await Eval.    DRUG ALLERGIES:  No Known Allergies  VITALS:  Blood pressure 111/60, pulse 54, temperature 98.1 F (36.7 C), temperature source Oral, resp. rate 18, height 6\' 3"  (1.905 m), SpO2 95 %.  PHYSICAL EXAMINATION:   Physical Exam  GENERAL:  65 y.o.-year-old patient lying in the bed with no acute distress.  EYES: Pupils equal, round, reactive to light and accommodation. No scleral icterus. Extraocular muscles intact.  HEENT: Head atraumatic, normocephalic. Oropharynx and nasopharynx clear.  NECK:  Supple, no jugular venous distention. No thyroid enlargement, no tenderness.  LUNGS: Normal breath sounds bilaterally, no wheezing, rales, rhonchi. No use of accessory muscles of respiration.  CARDIOVASCULAR: S1, S2 normal. No murmurs, rubs, or gallops.  ABDOMEN: Soft, nontender, nondistended. Bowel sounds present. No organomegaly or mass.  EXTREMITIES: No cyanosis, clubbing or edema b/l.  Right Foot dressing in place.  NEUROLOGIC: Cranial nerves II through XII are  intact. No focal Motor or sensory deficits b/l.   PSYCHIATRIC: The patient is alert and oriented x 3.   SKIN: No obvious rash, lesion, or ulcer.    LABORATORY PANEL:   CBC  Recent Labs Lab 06/01/15 0607  WBC 6.4  HGB 11.6*  HCT 33.9*  PLT 218   ------------------------------------------------------------------------------------------------------------------  Chemistries   Recent Labs Lab 05/31/15 1543 06/01/15 0607 06/02/15 0656  NA 137 134*  --   K 3.7 4.1  --   CL 103 104  --   CO2 24 22  --   GLUCOSE 96 242*  --   BUN 18 15  --   CREATININE 1.34* 1.42* 1.43*  CALCIUM 9.0 8.4*  --   AST 20  --   --   ALT 18  --   --   ALKPHOS 76  --   --   BILITOT 0.6  --   --    ------------------------------------------------------------------------------------------------------------------  Cardiac Enzymes No results for input(s): TROPONINI in the last 168 hours. ------------------------------------------------------------------------------------------------------------------  RADIOLOGY:  Mr Foot Right Wo Contrast  05/31/2015  CLINICAL DATA:  Nonhealing ulcer on the right great toe. Osteomyelitis. EXAM: MRI OF THE RIGHT FOOT WITHOUT CONTRAST TECHNIQUE: Multiplanar, multisequence MR imaging was performed. No intravenous contrast was administered. COMPARISON:  None. FINDINGS: There is destruction of the proximal and distal phalanges of the great toe at the IP joint. There is abnormal edema throughout both phalangeal bones of the great toe consistent with osteomyelitis. Small effusion at the IP joint of the great toe. The other bones of the foot appear normal. No appreciable soft tissue abscess. Slight tenosynovitis involving the posterior  tibialis and flexor digitorum longus and flexor hallucis longus tendons. IMPRESSION: Osteomyelitis of the proximal and distal phalangeal bones of the great toe. Electronically Signed   By: Lorriane Shire M.D.   On: 05/31/2015 17:42      ASSESSMENT AND PLAN:   65 year old male with past medical history of diabetes, hypertension, hyperlipidemia, diabetic neuropathy who presented to the hospital with a right great toe ulcer consistent with osteomyelitis.  #1 right great toe ulcer/osteomyelitis-this is the cause of patient's right toe pain swelling and redness. Patient had failed outpatient oral antibiotic therapy. Patient was admitted to the hospital and started on IV antibiotics with vancomycin, Zosyn. -Patient is S/p Amputation of right hallus to first MTPJ POD # 1.  -Continue further care as per podiatry. - appreciate ID input and await intraoperative cultures and can likely go home on Oral abx as per ID.    #2 diabetes type 2 with neuropathy-continue Lantus, sliding scale insulin.  #3 hypertension-continue lisinopril.  #4 diabetic neuropathy-continue Neurontin.  #5 Hyperlipidemia - cont. Pravachol.     All the records are reviewed and case discussed with Care Management/Social Workerr. Management plans discussed with the patient, family and they are in agreement.  CODE STATUS: Full code  DVT Prophylaxis: Lovenox  TOTAL TIME TAKING CARE OF THIS PATIENT: 25 minutes.   POSSIBLE D/C IN 1-2 DAYS, DEPENDING ON CLINICAL CONDITION.   Henreitta Leber M.D on 06/02/2015 at 3:00 PM  Between 7am to 6pm - Pager - [phone]  After 6pm go to [email or web address] - password [email or web address] [facility]  [place] [facility]  Office  [phone]  CC: Primary care physician; Adin Hector, MD

## 2015-06-02 NOTE — Progress Notes (Signed)
[place] INFECTIOUS DISEASE PROGRESS NOTE Date of Admission:  05/31/2015     ID: Andrew Harding is a 65 y.o. male with Diabetic foot infection  Principal Problem:   Diabetic osteomyelitis ([place]) Active Problems:   Osteomyelitis ([place])   Subjective: No fevers, cx pending  ROS  Eleven systems are reviewed and negative except per hpi  Medications:  Antibiotics Given (last 72 hours)    Date/Time Action Medication Dose Rate   05/31/15 1849 Given   piperacillin-tazobactam (ZOSYN) IVPB 3.375 g 3.375 g 12.5 mL/hr   05/31/15 1850 Given   vancomycin (VANCOCIN) IVPB 1000 mg/200 mL premix 1,000 mg 200 mL/hr   06/01/15 0053 Given   vancomycin (VANCOCIN) 1,250 mg in sodium chloride 0.9 % 250 mL IVPB 1,250 mg 166.7 mL/hr   06/01/15 0233 Given   piperacillin-tazobactam (ZOSYN) IVPB 3.375 g 3.375 g 12.5 mL/hr   06/01/15 1111 Given   piperacillin-tazobactam (ZOSYN) IVPB 3.375 g 3.375 g 12.5 mL/hr   06/01/15 1111 Given   vancomycin (VANCOCIN) 1,250 mg in sodium chloride 0.9 % 250 mL IVPB 1,250 mg 166.7 mL/hr   06/01/15 1945 Given   piperacillin-tazobactam (ZOSYN) IVPB 3.375 g 3.375 g 12.5 mL/hr   06/02/15 0211 Given   vancomycin (VANCOCIN) 1,250 mg in sodium chloride 0.9 % 250 mL IVPB 1,250 mg 166.7 mL/hr   06/02/15 0353 Given   piperacillin-tazobactam (ZOSYN) IVPB 3.375 g 3.375 g 12.5 mL/hr   06/02/15 1110 Given   piperacillin-tazobactam (ZOSYN) IVPB 3.375 g 3.375 g 12.5 mL/hr   06/02/15 1252 Given   vancomycin (VANCOCIN) 1,250 mg in sodium chloride 0.9 % 250 mL IVPB 1,250 mg 166.7 mL/hr     . cholecalciferol  2,000 Units Oral Daily  . docusate sodium  100 mg Oral BID  . enoxaparin (LOVENOX) injection  40 mg Subcutaneous Q24H  . gabapentin  300 mg Oral TID  . insulin aspart  0-5 Units Subcutaneous QHS  . insulin aspart  0-9 Units Subcutaneous TID WC  . insulin glargine  35 Units Subcutaneous QHS  . lisinopril  5 mg Oral Daily  . piperacillin-tazobactam (ZOSYN)  IV  3.375 g  Intravenous 3 times per day  . pravastatin  80 mg Oral QHS    Objective: Vital signs in last 24 hours: Temp:  [97.3 F (36.3 C)-98.2 F (36.8 C)] 98.1 F (36.7 C) (02/01 1452) Pulse Rate:  [53-60] 54 (02/01 1452) Resp:  [18-20] 18 (02/01 1452) BP: (109-140)/(56-71) 111/60 mmHg (02/01 1452) SpO2:  [95 %-99 %] 95 % (02/01 1452) Constitutional: He is oriented to person, place, and time. He appears well-developed and well-nourished. No distress.  HENT:  Mouth/Throat: Oropharynx is clear and moist. No oropharyngeal exudate.  Cardiovascular: Normal rate, regular rhythm and normal heart sounds. Exam reveals no gallop and no friction rub.  No murmur heard.  Pulmonary/Chest: Effort normal and breath sounds normal. No respiratory distress. He has no wheezes.  Abdominal: Soft. Bowel sounds are normal. He exhibits no distension. There is no tenderness.  Lymphadenopathy: He has no cervical adenopathy.  Neurological: He is alert and oriented to person, place, and time.  Skin: R great toe wrapped post op Psychiatric: He has a normal mood and affect. His behavior is normal.   Lab Results  Recent Labs  05/31/15 1543 06/01/15 0607 06/02/15 0656  WBC 10.4 6.4  --   HGB 12.2* 11.6*  --   HCT 36.2* 33.9*  --   NA 137 134*  --   K 3.7 4.1  --  CL 103 104  --   CO2 24 22  --   BUN 18 15  --   CREATININE 1.34* 1.42* 1.43*    Microbiology: Results for orders placed or performed during the hospital encounter of 05/31/15  CULTURE, BLOOD (ROUTINE X 2) w Reflex to PCR ID Panel     Status: None (Preliminary result)   Collection Time: 05/31/15  3:43 PM  Result Value Ref Range Status   Specimen Description BLOOD LEFT ASSIST CONTROL  Final   Special Requests BOTTLES DRAWN AEROBIC AND ANAEROBIC 4CCAERO,3CCANA  Final   Culture NO GROWTH < 24 HOURS  Final   Report Status PENDING  Incomplete  CULTURE, BLOOD (ROUTINE X 2) w Reflex to PCR ID Panel     Status: None (Preliminary result)    Collection Time: 05/31/15  3:52 PM  Result Value Ref Range Status   Specimen Description BLOOD RIGHT ASSIST CONTROL  Final   Special Requests BOTTLES DRAWN AEROBIC AND ANAEROBIC 5CCAERO,3CCANA  Final   Culture NO GROWTH < 24 HOURS  Final   Report Status PENDING  Incomplete  MRSA PCR Screening     Status: None   Collection Time: 05/31/15  4:30 PM  Result Value Ref Range Status   MRSA by PCR NEGATIVE NEGATIVE Final    Comment:        The GeneXpert MRSA Assay (FDA approved for NASAL specimens only), is one component of a comprehensive MRSA colonization surveillance program. It is not intended to diagnose MRSA infection nor to guide or monitor treatment for MRSA infections.   Wound culture     Status: None (Preliminary result)   Collection Time: 06/01/15 12:34 PM  Result Value Ref Range Status   Specimen Description BONE  Final   Special Requests NONE  Final   Gram Stain FEW WBC SEEN FEW GRAM NEGATIVE RODS   Final   Culture HOLDING FOR POSSIBLE PATHOGEN  Final   Report Status PENDING  Incomplete    Studies/Results: Mr Foot Right Wo Contrast  05/31/2015  CLINICAL DATA:  Nonhealing ulcer on the right great toe. Osteomyelitis. EXAM: MRI OF THE RIGHT FOOT WITHOUT CONTRAST TECHNIQUE: Multiplanar, multisequence MR imaging was performed. No intravenous contrast was administered. COMPARISON:  None. FINDINGS: There is destruction of the proximal and distal phalanges of the great toe at the IP joint. There is abnormal edema throughout both phalangeal bones of the great toe consistent with osteomyelitis. Small effusion at the IP joint of the great toe. The other bones of the foot appear normal. No appreciable soft tissue abscess. Slight tenosynovitis involving the posterior tibialis and flexor digitorum longus and flexor hallucis longus tendons. IMPRESSION: Osteomyelitis of the proximal and distal phalangeal bones of the great toe. Electronically Signed   By: Lorriane Shire M.D.   On: 05/31/2015  17:42    Assessment/Plan: Andrew Harding is a 65 y.o. male history of insulin-dependent diabetes mellitus (A1c 8.6) , coronary artery disease status post CABG, peripheral neuropathy, hypertension admitted with nonhealing medial right first toe ulcer. S/p Toe amputation 1/31. Cx pending but GS showed few GNR.  MRI did not show extension of infection into foot except for slight tenosynovitis. ESR 54,  Recommendations Cont IV abx for now Can treat orally based on cultures - likely doxy and cipro for a 2 week course.  Thank you very much for the consult. Will follow with you.  [place], [place]   06/02/2015, 4:07 PM

## 2015-06-03 LAB — GLUCOSE, CAPILLARY: GLUCOSE-CAPILLARY: 224 mg/dL — AB (ref 65–99)

## 2015-06-03 LAB — VANCOMYCIN, TROUGH: VANCOMYCIN TR: 18 ug/mL (ref 10–20)

## 2015-06-03 MED ORDER — CIPROFLOXACIN HCL 500 MG PO TABS: 500.0000 mg | ORAL_TABLET | Freq: Two times a day (BID) | ORAL | Status: AC

## 2015-06-03 MED ORDER — VANCOMYCIN HCL 10 G IV SOLR
1250.0000 mg | Freq: Two times a day (BID) | INTRAVENOUS | Status: DC
  Administered 2015-06-03: 1250 mg via INTRAVENOUS
  Filled 2015-06-03 (×3): qty 1250

## 2015-06-03 MED ORDER — DOXYCYCLINE HYCLATE 100 MG PO CAPS
100.0000 mg | ORAL_CAPSULE | Freq: Two times a day (BID) | ORAL | Status: AC
Start: 2015-06-03 — End: 2015-06-17

## 2015-06-03 MED ORDER — HYDROCODONE-ACETAMINOPHEN 5-325 MG PO TABS: 1.0000 | ORAL_TABLET | Freq: Four times a day (QID) | ORAL | Status: DC | PRN

## 2015-06-03 NOTE — Discharge Summary (Signed)
[facility] [place] NAME: Colben Denno    MR#:  [id number]  DATE OF BIRTH:  February 27, 1951  DATE OF ADMISSION:  05/31/2015 ADMITTING PHYSICIAN: Gladstone Lighter, MD  DATE OF DISCHARGE: 06/03/2015 11:35 AM  PRIMARY CARE PHYSICIAN: Tama High III, MD    ADMISSION DIAGNOSIS:  Osteomyelitis GREAT TOE  DISCHARGE DIAGNOSIS:  Principal Problem:   Diabetic osteomyelitis ([place]) Active Problems:   Osteomyelitis ([place])   SECONDARY DIAGNOSIS:   Past Medical History  Diagnosis Date  . Diabetes mellitus without complication ([place])   . Coronary artery disease   . Diabetic neuropathy ([place])   . Hypertension   . Gout   . Hyperlipidemia   . Vitamin D deficiency     HOSPITAL COURSE:   65 year old male with past medical history of diabetes, hypertension, hyperlipidemia, diabetic neuropathy who presented to the hospital with a right great toe ulcer consistent with osteomyelitis.  #1 right great toe ulcer/osteomyelitis-this was the cause of patient's right toe pain swelling and redness. Patient had failed outpatient oral antibiotic therapy. Patient was admitted to the hospital and started on IV antibiotics with vancomycin, Zosyn. -Patient was seed by Podiatry and is S/p Amputation of right hallus to first MTPJ POD # 2. - his pain has improved and he was seen by Infectious disease and they recommended 2 weeks of Oral abx with Cipro, doxycycline which he was discharged on. -She is now being discharged home with close follow-up with dietary as an outpatient.  #2 diabetes type 2 with neuropathy- he will continue Lantus, Lispro with meals.  - his BS remained stable while in the hospital.   #3 hypertension-he will continue lisinopril.  #4 diabetic neuropathy- he will continue Neurontin.  #5 Hyperlipidemia - he will cont. Pravachol  DISCHARGE CONDITIONS:   Stable  CONSULTS OBTAINED:  Treatment Team:  Adrian Prows, MD Katha Cabal,  MD  DRUG ALLERGIES:  No Known Allergies  DISCHARGE MEDICATIONS:   Discharge Medication List as of 06/03/2015 11:13 AM    START taking these medications   Details  ciprofloxacin (CIPRO) 500 MG tablet Take 1 tablet (500 mg total) by mouth 2 (two) times daily., Starting 06/03/2015, Until Thu 06/17/15, Print    doxycycline (VIBRAMYCIN) 100 MG capsule Take 1 capsule (100 mg total) by mouth 2 (two) times daily., Starting 06/03/2015, Until Thu 06/17/15, Print    HYDROcodone-acetaminophen (NORCO) 5-325 MG tablet Take 1 tablet by mouth every 6 (six) hours as needed for moderate pain., Starting 06/03/2015, Until Discontinued, Print      CONTINUE these medications which have NOT CHANGED   Details  aspirin EC 81 MG tablet Take 81 mg by mouth daily., Until Discontinued, Historical Med    cholecalciferol (VITAMIN D) 1000 units tablet Take 2,000 Units by mouth daily., Until Discontinued, Historical Med    gabapentin (NEURONTIN) 300 MG capsule Take 300 mg by mouth 3 (three) times daily., Until Discontinued, Historical Med    insulin glargine (LANTUS) 100 UNIT/ML injection Inject 52 Units into the skin at bedtime., Until Discontinued, Historical Med    insulin lispro (HUMALOG) 100 UNIT/ML injection Inject 15-20 Units into the skin 3 (three) times daily with meals. Pt uses 15 units with breakfast, 18 units with lunch, and 20 units with dinner., Until Discontinued, Historical Med    lisinopril (PRINIVIL,ZESTRIL) 5 MG tablet Take 5 mg by mouth daily., Until Discontinued, Historical Med    pravastatin (PRAVACHOL) 80 MG tablet Take 80 mg by mouth at  bedtime., Until Discontinued, Historical Med    SitaGLIPtin-MetFORMIN HCl (JANUMET XR) 50-500 MG TB24 Take 1 tablet by mouth daily with supper., Until Discontinued, Historical Med    traZODone (DESYREL) 50 MG tablet Take 50 mg by mouth at bedtime as needed for sleep., Until Discontinued, Historical Med         DISCHARGE INSTRUCTIONS:   DIET:  Cardiac diet  and Diabetic diet  DISCHARGE CONDITION:  Stable  ACTIVITY:  Activity as tolerated  OXYGEN:  Home Oxygen: No.   Oxygen Delivery: room air  DISCHARGE LOCATION:  home   If you experience worsening of your admission symptoms, develop shortness of breath, life threatening emergency, suicidal or homicidal thoughts you must seek medical attention immediately by calling 911 or calling your MD immediately  if symptoms less severe.  You Must read complete instructions/literature along with all the possible adverse reactions/side effects for all the Medicines you take and that have been prescribed to you. Take any new Medicines after you have completely understood and accpet all the possible adverse reactions/side effects.   Please note  You were cared for by a hospitalist during your hospital stay. If you have any questions about your discharge medications or the care you received while you were in the hospital after you are discharged, you can call the unit and asked to speak with the hospitalist on call if the hospitalist that took care of you is not available. Once you are discharged, your primary care physician will handle any further medical issues. Please note that NO REFILLS for any discharge medications will be authorized once you are discharged, as it is imperative that you return to your primary care physician (or establish a relationship with a primary care physician if you do not have one) for your aftercare needs so that they can reassess your need for medications and monitor your lab values.     Today   No complaints presently.  Wants to go home.  Podiatry changing his dressing presently.   VITAL SIGNS:  Blood pressure 132/58, pulse 56, temperature 98 F (36.7 C), temperature source Oral, resp. rate 18, height 6\' 3"  (1.905 m), SpO2 97 %.  I/O:   Intake/Output Summary (Last 24 hours) at 06/03/15 1641 Last data filed at 06/02/15 1818  Gross per 24 hour  Intake      0 ml   Output      0 ml  Net      0 ml    PHYSICAL EXAMINATION:   GENERAL: 65 y.o.-year-old patient lying in the bed with no acute distress.  EYES: Pupils equal, round, reactive to light and accommodation. No scleral icterus. Extraocular muscles intact.  HEENT: Head atraumatic, normocephalic. Oropharynx and nasopharynx clear.  NECK: Supple, no jugular venous distention. No thyroid enlargement, no tenderness.  LUNGS: Normal breath sounds bilaterally, no wheezing, rales, rhonchi. No use of accessory muscles of respiration.  CARDIOVASCULAR: S1, S2 normal. No murmurs, rubs, or gallops.  ABDOMEN: Soft, nontender, nondistended. Bowel sounds present. No organomegaly or mass.  EXTREMITIES: No cyanosis, clubbing or edema b/l. Right Foot dressing in place.  NEUROLOGIC: Cranial nerves II through XII are intact. No focal Motor or sensory deficits b/l.  PSYCHIATRIC: The patient is alert and oriented x 3.  SKIN: No obvious rash, lesion, or ulcer.   DATA REVIEW:   CBC  Recent Labs Lab 06/01/15 0607  WBC 6.4  HGB 11.6*  HCT 33.9*  PLT 218    Chemistries   Recent Labs Lab 05/31/15  1543 06/01/15 0607 06/02/15 0656  NA 137 134*  --   K 3.7 4.1  --   CL 103 104  --   CO2 24 22  --   GLUCOSE 96 242*  --   BUN 18 15  --   CREATININE 1.34* 1.42* 1.43*  CALCIUM 9.0 8.4*  --   AST 20  --   --   ALT 18  --   --   ALKPHOS 76  --   --   BILITOT 0.6  --   --     Cardiac Enzymes No results for input(s): TROPONINI in the last 168 hours.  Microbiology Results  Results for orders placed or performed during the hospital encounter of 05/31/15  CULTURE, BLOOD (ROUTINE X 2) w Reflex to PCR ID Panel     Status: None (Preliminary result)   Collection Time: 05/31/15  3:43 PM  Result Value Ref Range Status   Specimen Description BLOOD LEFT ASSIST CONTROL  Final   Special Requests BOTTLES DRAWN AEROBIC AND ANAEROBIC 4CCAERO,3CCANA  Final   Culture NO GROWTH 3 DAYS  Final   Report  Status PENDING  Incomplete  CULTURE, BLOOD (ROUTINE X 2) w Reflex to PCR ID Panel     Status: None (Preliminary result)   Collection Time: 05/31/15  3:52 PM  Result Value Ref Range Status   Specimen Description BLOOD RIGHT ASSIST CONTROL  Final   Special Requests BOTTLES DRAWN AEROBIC AND ANAEROBIC 5CCAERO,3CCANA  Final   Culture NO GROWTH 3 DAYS  Final   Report Status PENDING  Incomplete  MRSA PCR Screening     Status: None   Collection Time: 05/31/15  4:30 PM  Result Value Ref Range Status   MRSA by PCR NEGATIVE NEGATIVE Final    Comment:        The GeneXpert MRSA Assay (FDA approved for NASAL specimens only), is one component of a comprehensive MRSA colonization surveillance program. It is not intended to diagnose MRSA infection nor to guide or monitor treatment for MRSA infections.   Wound culture     Status: None (Preliminary result)   Collection Time: 06/01/15 12:34 PM  Result Value Ref Range Status   Specimen Description BONE  Final   Special Requests NONE  Final   Gram Stain FEW WBC SEEN FEW GRAM NEGATIVE RODS   Final   Culture   Final    RARE GROWTH PSEUDOMONAS AERUGINOSA SUSCEPTIBILITIES TO FOLLOW    Report Status PENDING  Incomplete    RADIOLOGY:  No results found.    Management plans discussed with the patient, family and they are in agreement.  CODE STATUS:  Code Status History    Date Active Date Inactive Code Status Order ID Comments User Context   05/31/2015  3:36 PM 06/03/2015  2:39 PM Full Code [id number]  Gladstone Lighter, MD Inpatient    Advance Directive Documentation        Most Recent Value   Type of Advance Directive  Living will   Pre-existing out of facility DNR order (yellow form or pink MOST form)     "MOST" Form in Place?        TOTAL TIME TAKING CARE OF THIS PATIENT: 40 minutes.    Henreitta Leber M.D on 06/03/2015 at 4:41 PM  Between 7am to 6pm - Pager - [phone]  After 6pm go to [email or web address] - password [email or web address] [facility]  Office  [phone]  CC: Primary care physician; Tama High  III, MD

## 2015-06-03 NOTE — Progress Notes (Signed)
Patient Demographics  Andrew Harding, is a 65 y.o. male   MRN: [id number]   DOB - 1950/08/18  Admit Date - 05/31/2015    Outpatient Primary MD for the patient is BERT Briscoe Burns III, MD  Consult requested in the Hospital by Henreitta Leber, MD, On 06/03/2015    With History of -  Past Medical History  Diagnosis Date  . Diabetes mellitus without complication ([place])   . Coronary artery disease   . Diabetic neuropathy ([place])   . Hypertension   . Gout   . Hyperlipidemia   . Vitamin D deficiency       Past Surgical History  Procedure Laterality Date  . Coronary artery bypass graft    . Knee arthroscopy      Left knee   . Cataract extraction, bilateral    . Excision partial phalanx Right 06/01/2015    Procedure: EXCISION PARTIAL PHALANX /  BONE;  Surgeon: Albertine Patricia, DPM;  Location: [facility] ORS;  Service: Podiatry;  Laterality: Right;  . Amputation toe Right 06/01/2015    Procedure: AMPUTATION TOE;  Surgeon: Albertine Patricia, DPM;  Location: [facility] ORS;  Service: Podiatry;  Laterality: Right;    HPI  Andrew Harding  is a 65 y.o. male, 2 days status post amputation right hallux and buried to extensive osteomyelitis in the great toe. States he is doing well at this point has been ambulating nicely. At physical therapy yesterday and did well with utilizing the OrthoWedge shoe. Bandages clean and dry.    Review of Systems    In addition to the HPI above,  No Fever-chills, No Headache, No changes with Vision or hearing, No problems swallowing food or Liquids, No Chest pain, Cough or Shortness of Breath, No Abdominal pain, No Nausea or Vommitting, Bowel movements are regular, No Blood in stool or Urine, No dysuria, No new skin rashes or bruises, No new joints pains-aches,  No new weakness, tingling, numbness in any extremity, No recent weight gain or loss, No polyuria, polydypsia or polyphagia, No  significant Mental Stressors.  A full 10 point Review of Systems was done, except as stated above, all other Review of Systems were negative.   Social History Social History  Substance Use Topics  . Smoking status: Never Smoker   . Smokeless tobacco: Not on file  . Alcohol Use: No     Family History Family History  Problem Relation Age of Onset  . Diabetes Mellitus II Mother   . CAD Mother     Prior to Admission medications   Medication Sig Start Date End Date Taking? Authorizing Provider  aspirin EC 81 MG tablet Take 81 mg by mouth daily.   Yes Historical Provider, MD  cholecalciferol (VITAMIN D) 1000 units tablet Take 2,000 Units by mouth daily.   Yes Historical Provider, MD  gabapentin (NEURONTIN) 300 MG capsule Take 300 mg by mouth 3 (three) times daily.   Yes Historical Provider, MD  insulin glargine (LANTUS) 100 UNIT/ML injection Inject 52 Units into the skin at bedtime.   Yes Historical Provider, MD  insulin lispro (HUMALOG) 100 UNIT/ML injection Inject 15-20 Units into the skin 3 (three) times daily with meals. Pt  uses 15 units with breakfast, 18 units with lunch, and 20 units with dinner.   Yes Historical Provider, MD  lisinopril (PRINIVIL,ZESTRIL) 5 MG tablet Take 5 mg by mouth daily.   Yes Historical Provider, MD  pravastatin (PRAVACHOL) 80 MG tablet Take 80 mg by mouth at bedtime.   Yes Historical Provider, MD  SitaGLIPtin-MetFORMIN HCl (JANUMET XR) 50-500 MG TB24 Take 1 tablet by mouth daily with supper.   Yes Historical Provider, MD  traZODone (DESYREL) 50 MG tablet Take 50 mg by mouth at bedtime as needed for sleep.   Yes Historical Provider, MD    Anti-infectives    Start     Dose/Rate Route Frequency Ordered Stop   06/03/15 0100  vancomycin (VANCOCIN) 1,250 mg in sodium chloride 0.9 % 250 mL IVPB     1,250 mg 166.7 mL/hr over 90 Minutes Intravenous Every 12 hours 06/03/15 0055     06/01/15 1300  vancomycin (VANCOCIN) 1,250 mg in sodium chloride 0.9 % 250 mL  IVPB  Status:  Discontinued     1,250 mg 166.7 mL/hr over 90 Minutes Intravenous Every 12 hours 05/31/15 1911 06/02/15 1358   06/01/15 0100  vancomycin (VANCOCIN) 1,250 mg in sodium chloride 0.9 % 250 mL IVPB     1,250 mg 166.7 mL/hr over 90 Minutes Intravenous  Once 05/31/15 1911 06/01/15 0223   05/31/15 1600  piperacillin-tazobactam (ZOSYN) IVPB 3.375 g     3.375 g 12.5 mL/hr over 240 Minutes Intravenous 3 times per day 05/31/15 1553     05/31/15 1600  vancomycin (VANCOCIN) IVPB 1000 mg/200 mL premix     1,000 mg 200 mL/hr over 60 Minutes Intravenous  Once 05/31/15 1553 05/31/15 1950      Scheduled Meds: . cholecalciferol  2,000 Units Oral Daily  . docusate sodium  100 mg Oral BID  . enoxaparin (LOVENOX) injection  40 mg Subcutaneous Q24H  . gabapentin  300 mg Oral TID  . insulin aspart  0-5 Units Subcutaneous QHS  . insulin aspart  0-9 Units Subcutaneous TID WC  . insulin glargine  35 Units Subcutaneous QHS  . lisinopril  5 mg Oral Daily  . piperacillin-tazobactam (ZOSYN)  IV  3.375 g Intravenous 3 times per day  . pravastatin  80 mg Oral QHS  . vancomycin  1,250 mg Intravenous Q12H   ConNo Known Allergies  Physical Exam  Vitals  Blood pressure 132/58, pulse 56, temperature 98 F (36.7 C), temperature source Oral, resp. rate 18, height 6\' 3"  (1.905 m), SpO2 97 %.  Lower Extremity exam: Pain is removed today and the wound inspected. Incision margins are intact. No evidence of infection. Mild erythema consistent with surgical inflammation but no evidence of progressive infection. No evidence of dehiscence.  VascData Review  CBC  Recent Labs Lab 05/31/15 1543 06/01/15 0607  WBC 10.4 6.4  HGB 12.2* 11.6*  HCT 36.2* 33.9*  PLT 252 218  MCV 85.7 86.4  MCH 28.9 29.6  MCHC 33.7 34.2  RDW 13.6 13.9   ------------------------------------------------------------------------------------------------------------------  Chemistries   Recent Labs Lab 05/31/15 1543  06/01/15 0607 06/02/15 0656  NA 137 134*  --   K 3.7 4.1  --   CL 103 104  --   CO2 24 22  --   GLUCOSE 96 242*  --   BUN 18 15  --   CREATININE 1.34* 1.42* 1.43*  CALCIUM 9.0 8.4*  --   AST 20  --   --   ALT 18  --   --  ALKPHOS 76  --   --   BILITOT 0.6  --   --    ------------------------------------------------------------------------------------------------------------------ estimated creatinine clearance is 70 mL/min (by C-G formula based on Cr of 1.43). ------------------------------------------------------------------------------------------------------------------ No results for input(s): TSH, T4TOTAL, T3FREE, THYROIDAB in the last 72 hours.  Invalid input(s): FREET3    Assessment & Plan: Wound appears very stable at this point appears to be well coapted and does not show any evidence of infection or dehiscence. Plan: Redressed the area today with lots of padding around the amputation site. Continue to utilize the OrthoWedge shoe appropriately. Physical therapy assessment today if that's good he can be discharged with the oral antibiotics. Dr. Ola Spurr recommended doxycycline and Cipro for 2 weeks orally. Leave dressing clean dry and intact and see me on Monday in my office. Ambulate no more than 15 minutes at a time and keep foot elevated just to one pillow when resting.  Principal Problem:   Diabetic osteomyelitis ([place]) Active Problems:   Osteomyelitis ([place])     Family Communication: Plan discussed with patient.    Perry Mount M.D on 06/03/2015 at 9:22 AM

## 2015-06-03 NOTE — Progress Notes (Signed)
[place] was admitted to the Hospital on 05/31/2015 and Discharged  06/03/2015 and should be excused from work/school   for 6 weeks starting 05/31/2015 , may return to work/school without any restrictions.  Call Abel Presto MD, [facility]  [phone] with questions.  Henreitta Leber M.D on 06/03/2015,at 10:07 AM

## 2015-06-03 NOTE — Progress Notes (Signed)
ANTIBIOTIC CONSULT NOTE - FOLLOW UP  Pharmacy Consult for vancomycin and piperacillin/tazobactam Indication: OM  No Known Allergies  Patient Measurements: Adjusted Body Weight: 96 kg  Vital Signs: Temp: 98.4 F (36.9 C) (02/01 1927) Temp Source: Oral (02/01 1927) BP: 106/58 mmHg (02/01 1927) Pulse Rate: 71 (02/01 1927) Intake/Output from previous day: 02/01 0701 - 02/02 0700 In: 240 [P.O.:240] Out: 0  Intake/Output from this shift:    Labs:  Recent Labs  05/31/15 1543 06/01/15 0607 06/02/15 0656  WBC 10.4 6.4  --   HGB 12.2* 11.6*  --   PLT 252 218  --   CREATININE 1.34* 1.42* 1.43*   Estimated Creatinine Clearance: 70 mL/min (by C-G formula based on Cr of 1.43).  Recent Labs  06/02/15 1227 06/03/15 0002  VANCOTROUGH 47* 18     Microbiology: Wound culture pending Blood culture (1/30) - no growth < 24 hours  Assessment: Patient is currently prescribed piperacillin/tazobactam 3.375 g IV q 8 hours EI and vancomycin 1250 mg IV q 12 hours.   Vancomycin trough scheduled for 1230 today was drawn at 1227 and resulted supratherapeutic at 47 mcg/mL. The next dose of vancomycin is charted as being given at 1252 this afternoon and scheduled to run over 90 minutes. Lab called regarding critical level at 1345. I spoke with RN at that time and the vancomycin dose was already done infusing, which makes me question documentation of timing of vancomycin trough in relation to vancomycin dose being given and raises concern that trough was drawn while dose was infusing.   Goal of Therapy:  Vancomycin trough level 15-20 mcg/ml  Plan:  Vancomycin trough therapeutic. Continue current dose. Pharmacy will continue to follow.   Laural Benes, Pharm.D., BCPS Clinical Pharmacist 06/03/2015,12:52 AM

## 2015-06-03 NOTE — Care Management (Signed)
Rolling walker delivered from [place]. PO ABX. Wound care at Dr. Selina Cooley office. No further RNCM needs. Case closed.

## 2015-06-03 NOTE — Progress Notes (Deleted)
Inpatient Diabetes Program Recommendations  AACE/ADA: New Consensus Statement on Inpatient Glycemic Control (2015)  Target Ranges:  Prepandial:   less than 140 mg/dL      Peak postprandial:   less than 180 mg/dL (1-2 hours)      Critically ill patients:  140 - 180 mg/dL   Review of Glycemic Control:  Results for BRAM, HASSER (MRN [id number]) as of 06/03/2015 11:38  Ref. Range 06/02/2015 07:33 06/02/2015 11:14 06/02/2015 16:23 06/02/2015 20:26 06/03/2015 07:23  Glucose-Capillary Latest Ref Range: 65-99 mg/dL 235 (H) 244 (H) 196 (H) 210 (H) 224 (H)    Diabetes history:  Outpatient Diabetes medications: Lantus 52 units q HS, Humalog 15 units breakfast, 18 units lunch, and 20 units with dinner,  Janumet XR-50-500 mg once daily Current orders for Inpatient glycemic control:  Novolog sensitive tid with meals and HS, Lantus 35 units q HS  Inpatient Diabetes Program Recommendations:    Please consider increasing Lantus to 45 units daily.  Also consider adding Novolog meal coverage 4 units tid with meals (Hold if patient eats less than 50%).  Thanks, Adah Perl, RN, BC-ADM Inpatient Diabetes Coordinator Pager [phone] (8a-5p)

## 2015-06-04 ENCOUNTER — Telehealth: Payer: Self-pay | Admitting: Infectious Diseases

## 2015-06-04 LAB — WOUND CULTURE

## 2015-06-04 LAB — ANAEROBIC CULTURE

## 2015-06-04 NOTE — Telephone Encounter (Signed)
Reviewd culture results with Staph lugdenesis, acinetobacter and pseudomonas.All were sensitive to cipro on which he was discharged.  Lugdenesis also sensitive to doay.   Should be adequately treated.

## 2015-06-05 LAB — CULTURE, BLOOD (ROUTINE X 2)
CULTURE: NO GROWTH
CULTURE: NO GROWTH

## 2015-06-05 LAB — ANAEROBIC CULTURE

## 2015-09-21 ENCOUNTER — Encounter (INDEPENDENT_AMBULATORY_CARE_PROVIDER_SITE_OTHER): Payer: Self-pay | Admitting: Ophthalmology

## 2015-10-08 DIAGNOSIS — N1831 Chronic kidney disease, stage 3a: Secondary | ICD-10-CM | POA: Insufficient documentation

## 2015-10-08 DIAGNOSIS — N183 Chronic kidney disease, stage 3 unspecified: Secondary | ICD-10-CM | POA: Insufficient documentation

## 2015-11-01 DIAGNOSIS — Z89421 Acquired absence of other right toe(s): Secondary | ICD-10-CM | POA: Insufficient documentation

## 2015-11-01 HISTORY — DX: Acquired absence of other right toe(s): Z89.421

## 2016-02-10 ENCOUNTER — Encounter: Payer: Self-pay | Admitting: *Deleted

## 2016-02-14 ENCOUNTER — Encounter
Admission: RE | Disposition: A | Discharge status: Home / Self Care | Payer: Self-pay | Source: Ambulatory Visit | Attending: Gastroenterology

## 2016-02-14 ENCOUNTER — Encounter: Payer: Self-pay | Admitting: *Deleted

## 2016-02-14 ENCOUNTER — Ambulatory Visit: Payer: Managed Care, Other (non HMO) | Admitting: Anesthesiology

## 2016-02-14 ENCOUNTER — Ambulatory Visit
Admission: RE | Admit: 2016-02-14 | Discharge: 2016-02-14 | Disposition: A | Discharge status: Home / Self Care | Payer: Managed Care, Other (non HMO) | Source: Ambulatory Visit | Attending: Gastroenterology | Admitting: Gastroenterology

## 2016-02-14 DIAGNOSIS — E785 Hyperlipidemia, unspecified: Secondary | ICD-10-CM | POA: Insufficient documentation

## 2016-02-14 DIAGNOSIS — Z7982 Long term (current) use of aspirin: Secondary | ICD-10-CM | POA: Diagnosis not present

## 2016-02-14 DIAGNOSIS — I2510 Atherosclerotic heart disease of native coronary artery without angina pectoris: Secondary | ICD-10-CM | POA: Diagnosis not present

## 2016-02-14 DIAGNOSIS — I10 Essential (primary) hypertension: Secondary | ICD-10-CM | POA: Diagnosis not present

## 2016-02-14 DIAGNOSIS — K5730 Diverticulosis of large intestine without perforation or abscess without bleeding: Secondary | ICD-10-CM | POA: Diagnosis not present

## 2016-02-14 DIAGNOSIS — E1140 Type 2 diabetes mellitus with diabetic neuropathy, unspecified: Secondary | ICD-10-CM | POA: Diagnosis not present

## 2016-02-14 DIAGNOSIS — D122 Benign neoplasm of ascending colon: Secondary | ICD-10-CM | POA: Insufficient documentation

## 2016-02-14 DIAGNOSIS — Z1211 Encounter for screening for malignant neoplasm of colon: Secondary | ICD-10-CM | POA: Diagnosis present

## 2016-02-14 DIAGNOSIS — Z951 Presence of aortocoronary bypass graft: Secondary | ICD-10-CM | POA: Insufficient documentation

## 2016-02-14 DIAGNOSIS — Z794 Long term (current) use of insulin: Secondary | ICD-10-CM | POA: Diagnosis not present

## 2016-02-14 HISTORY — PX: COLONOSCOPY WITH PROPOFOL: SHX5780

## 2016-02-14 LAB — GLUCOSE, CAPILLARY: GLUCOSE-CAPILLARY: 126 mg/dL — AB (ref 65–99)

## 2016-02-14 SURGERY — COLONOSCOPY WITH PROPOFOL
Anesthesia: General

## 2016-02-14 MED ORDER — GLYCOPYRROLATE 0.2 MG/ML IJ SOLN
INTRAMUSCULAR | Status: DC | PRN
  Administered 2016-02-14: 0.2 mg via INTRAVENOUS

## 2016-02-14 MED ORDER — PROPOFOL 500 MG/50ML IV EMUL
INTRAVENOUS | Status: DC | PRN
  Administered 2016-02-14: 150 ug/kg/min via INTRAVENOUS

## 2016-02-14 MED ORDER — SODIUM CHLORIDE 0.9 % IV SOLN: INTRAVENOUS | Status: DC

## 2016-02-14 MED ORDER — FENTANYL CITRATE (PF) 100 MCG/2ML IJ SOLN
INTRAMUSCULAR | Status: DC | PRN
  Administered 2016-02-14: 50 ug via INTRAVENOUS

## 2016-02-14 MED ORDER — PROPOFOL 10 MG/ML IV BOLUS
INTRAVENOUS | Status: DC | PRN
Start: 2016-02-14 — End: 2016-02-14
  Administered 2016-02-14: 60 mg via INTRAVENOUS

## 2016-02-14 MED ORDER — SODIUM CHLORIDE 0.9 % IV SOLN
INTRAVENOUS | Status: DC
  Administered 2016-02-14: 1000 mL via INTRAVENOUS

## 2016-02-14 NOTE — H&P (Signed)
Outpatient short stay form Pre-procedure 02/14/2016 7:43 AM Lollie Sails MD  Primary Physician: Dr. Ramonita Lab  Reason for visit:  Colonoscopy  History of present illness:  Patient is a 65 year old male presenting today for screening colonoscopy. His last colonoscopy was over 10 years ago. There is no family history of colon polyps or colon cancer. He tolerated his prep well. He does take a daily aspirin but has held that for about 2 days. He has a personal history of CABG.    Current Facility-Administered Medications:  .  0.9 %  sodium chloride infusion, , Intravenous, Continuous, Lollie Sails, MD, Last Rate: 20 mL/hr at 02/14/16 0716, 1,000 mL at 02/14/16 0716 .  0.9 %  sodium chloride infusion, , Intravenous, Continuous, Lollie Sails, MD  Prescriptions Prior to Admission  Medication Sig Dispense Refill Last Dose  . aspirin EC 81 MG tablet Take 81 mg by mouth daily.   02/13/2016 at 0700  . cholecalciferol (VITAMIN D) 1000 units tablet Take 2,000 Units by mouth daily.   02/13/2016 at Unknown time  . gabapentin (NEURONTIN) 300 MG capsule Take 300 mg by mouth 3 (three) times daily.   02/13/2016 at Unknown time  . lisinopril (PRINIVIL,ZESTRIL) 5 MG tablet Take 5 mg by mouth daily.   02/13/2016 at Unknown time  . pravastatin (PRAVACHOL) 80 MG tablet Take 80 mg by mouth at bedtime.   02/13/2016 at Unknown time  . HYDROcodone-acetaminophen (NORCO) 5-325 MG tablet Take 1 tablet by mouth every 6 (six) hours as needed for moderate pain. (Patient not taking: Reported on 02/14/2016) 20 tablet 0 Not Taking at Unknown time  . insulin glargine (LANTUS) 100 UNIT/ML injection Inject 52 Units into the skin at bedtime.   02/12/2016  . insulin lispro (HUMALOG) 100 UNIT/ML injection Inject 15-20 Units into the skin 3 (three) times daily with meals. Pt uses 15 units with breakfast, 18 units with lunch, and 20 units with dinner.   02/12/2016  . SitaGLIPtin-MetFORMIN HCl (JANUMET XR) 50-500 MG  TB24 Take 1 tablet by mouth daily with supper.   02/12/2016  . traZODone (DESYREL) 50 MG tablet Take 50 mg by mouth at bedtime as needed for sleep.   02/12/2016     No Known Allergies   Past Medical History:  Diagnosis Date  . Coronary artery disease   . Diabetes mellitus without complication ([place])   . Diabetic neuropathy ([place])   . Gout   . Hyperlipidemia   . Hypertension   . Vitamin D deficiency     Review of systems:      Physical Exam    Heart and lungs: Regular rate and rhythm without rub or gallop, lungs are bilaterally clear.    HEENT: Normocephalic atraumatic eyes are anicteric    Other:     Pertinant exam for procedure: Soft nontender nondistended bowel sounds positive normoactive.    Planned proceedures: Colonoscopy and indicated procedures. I have discussed the risks benefits and complications of procedures to include not limited to bleeding, infection, perforation and the risk of sedation and the patient wishes to proceed.    Lollie Sails, MD Gastroenterology 02/14/2016  7:43 AM

## 2016-02-14 NOTE — Anesthesia Postprocedure Evaluation (Signed)
Anesthesia Post Note  [name]  Procedure(s) Performed: Procedure(s) (LRB): COLONOSCOPY WITH PROPOFOL (N/A)  Patient location during evaluation: Endoscopy Anesthesia Type: General Level of consciousness: awake and alert Pain management: pain level controlled Vital Signs Assessment: post-procedure vital signs reviewed and stable Respiratory status: spontaneous breathing, nonlabored ventilation, respiratory function stable and patient connected to nasal cannula oxygen Cardiovascular status: blood pressure returned to baseline and stable Postop Assessment: no signs of nausea or vomiting Anesthetic complications: no    Last Vitals:  Vitals:   02/14/16 0926 02/14/16 0936  BP: 124/69 131/70  Pulse: 63 62  Resp: 17 16  Temp:      Last Pain:  Vitals:   02/14/16 0906  TempSrc: Axillary  PainSc: Asleep                 Precious Haws Scott Vanderveer

## 2016-02-14 NOTE — Anesthesia Preprocedure Evaluation (Signed)
Anesthesia Evaluation  Patient identified by MRN, date of birth, ID band Patient awake    Reviewed: Allergy & Precautions, H&P , NPO status , Patient's Chart, lab work & pertinent test results  History of Anesthesia Complications Negative for: history of anesthetic complications  Airway Mallampati: III  TM Distance: >3 FB Neck ROM: limited    Dental  (+) Poor Dentition   Pulmonary neg pulmonary ROS, neg shortness of breath,    Pulmonary exam normal breath sounds clear to auscultation       Cardiovascular Exercise Tolerance: Good hypertension, (-) angina+ CAD and + CABG  (-) DOE Normal cardiovascular exam Rhythm:regular Rate:Normal     Neuro/Psych negative neurological ROS  negative psych ROS   GI/Hepatic negative GI ROS, Neg liver ROS,   Endo/Other  diabetes, Type 2, Insulin Dependent  Renal/GU negative Renal ROS  negative genitourinary   Musculoskeletal   Abdominal   Peds  Hematology negative hematology ROS (+)   Anesthesia Other Findings Past Medical History: No date: Coronary artery disease No date: Diabetes mellitus without complication (HCC) No date: Diabetic neuropathy ([place]) No date: Gout No date: Hyperlipidemia No date: Hypertension No date: Vitamin D deficiency  Past Surgical History: 06/01/2015: AMPUTATION TOE Right     Comment: Procedure: AMPUTATION TOE;  Surgeon: Albertine Patricia, DPM;  Location: [facility] ORS;  Service:               Podiatry;  Laterality: Right; No date: CATARACT EXTRACTION, BILATERAL No date: CORONARY ARTERY BYPASS GRAFT 06/01/2015: EXCISION PARTIAL PHALANX Right     Comment: Procedure: EXCISION PARTIAL PHALANX /  BONE;                Surgeon: Albertine Patricia, DPM;  Location: [facility]               ORS;  Service: Podiatry;  Laterality: Right; No date: KNEE ARTHROSCOPY     Comment: Left knee   BMI    Body Mass Index:  33.62 kg/m       Reproductive/Obstetrics negative OB ROS                             Anesthesia Physical Anesthesia Plan  ASA: III  Anesthesia Plan: General   Post-op Pain Management:    Induction:   Airway Management Planned:   Additional Equipment:   Intra-op Plan:   Post-operative Plan:   Informed Consent: I have reviewed the patients History and Physical, chart, labs and discussed the procedure including the risks, benefits and alternatives for the proposed anesthesia with the patient or authorized representative who has indicated his/her understanding and acceptance.   Dental Advisory Given  Plan Discussed with: Anesthesiologist, CRNA and Surgeon  Anesthesia Plan Comments:         Anesthesia Quick Evaluation

## 2016-02-14 NOTE — Transfer of Care (Addendum)
Immediate Anesthesia Transfer of Care Note  [name]  Procedure(s) Performed: Procedure(s): COLONOSCOPY WITH PROPOFOL (N/A)  Patient Location: PACU  Anesthesia Type:General  Level of Consciousness: awake, alert , oriented and patient cooperative  Airway & Oxygen Therapy: Patient Spontanous Breathing and Patient connected to nasal cannula oxygen  Post-op Assessment: Report given to RN and Post -op Vital signs reviewed and stable  Post vital signs: Reviewed and stable  Last Vitals:  Vitals:   02/14/16 0656 02/14/16 0906  BP: (!) 150/74 127/85  Pulse: (!) 58 67  Resp: 18 11  Temp: 36.6 C 36.3 C    Last Pain:  Vitals:   02/14/16 0656  TempSrc: Oral         Complications: No apparent anesthesia complications

## 2016-02-14 NOTE — Op Note (Signed)
[facility] Gastroenterology Patient Name: Andrew Harding Procedure Date: 02/14/2016 7:38 AM MRN: [id number] Account #: [id number] Date of Birth: 07-May-1950 Admit Type: Outpatient Age: 65 Room: [facility] ENDO ROOM 3 Gender: Male Note Status: Finalized Procedure:            Colonoscopy Indications:          Screening for colorectal malignant neoplasm Providers:            Lollie Sails, MD Referring MD:         Ramonita Lab, MD (Referring MD) Medicines:            Monitored Anesthesia Care Complications:        No immediate complications. Procedure:            Pre-Anesthesia Assessment:                       - ASA Grade Assessment: III - A patient with severe                        systemic disease.                       After obtaining informed consent, the colonoscope was                        passed under direct vision. Throughout the procedure,                        the patient's blood pressure, pulse, and oxygen                        saturations were monitored continuously. The                        Colonoscope was introduced through the anus and                        advanced to the the cecum, identified by appendiceal                        orifice and ileocecal valve. The colonoscopy was                        performed with moderate difficulty. Successful                        completion of the procedure was aided by using manual                        pressure. Findings:      A 17 mm polyp was found in the ascending colon. The polyp was       pedunculated. The polyp was removed with a hot snare. Resection and       retrieval were complete. To prevent bleeding after the polypectomy, one       hemostatic clip was successfully placed. There was no bleeding at the       end of the maneuver.      Multiple medium-mouthed diverticula were found in the sigmoid colon,       descending colon, transverse colon and ascending colon.      A 48 mm  polyp was found at  15 cm proximal to the anus. The polyp was       pedunculated. The polyp was removed with a hot snare. The polyp was       removed with a lift and cut technique using a hot snare. The polyp was       removed with a piecemeal technique using a hot snare. Resection and       retrieval were complete. To prevent bleeding after the polypectomy, one       hemostatic clip was successfully placed. There was no bleeding at the       end of the maneuver. Area was tattooed with an injection of 3 mL of       [place] ink.      The digital rectal exam was normal. Impression:           - One 17 mm polyp in the ascending colon, removed with                        a hot snare. Resected and retrieved. Clip was placed.                       - Diverticulosis in the sigmoid colon, in the                        descending colon, in the transverse colon and in the                        ascending colon.                       - One 48 mm polyp at 15 cm proximal to the anus,                        removed with a hot snare, removed using lift and cut                        and a hot snare and removed piecemeal using a hot                        snare. Resected and retrieved. Clip was placed.                        Tattooed. Recommendation:       - Await pathology results.                       - Telephone GI clinic for pathology results in 1 week. Procedure Code(s):    --- Professional ---                       [phone], Colonoscopy, flexible; with removal of tumor(s),                        polyp(s), or other lesion(s) by snare technique                       [id number], Colonoscopy, flexible; with directed submucosal                        injection(s), any substance  CPT copyright 2016 American Medical Association. All rights reserved. The codes documented in this report are preliminary and upon coder review may  be revised to meet current compliance requirements. Lollie Sails, MD 02/14/2016 9:06:28 AM This report  has been signed electronically. Number of Addenda: 0 Note Initiated On: 02/14/2016 7:38 AM Scope Withdrawal Time: 0 hours 52 minutes 21 seconds  Total Procedure Duration: 1 hour 10 minutes 17 seconds       [facility]

## 2016-02-15 LAB — SURGICAL PATHOLOGY

## 2016-02-19 ENCOUNTER — Encounter: Payer: Self-pay | Admitting: Gastroenterology

## 2016-05-18 ENCOUNTER — Ambulatory Visit
Admission: RE | Admit: 2016-05-18 | Payer: Managed Care, Other (non HMO) | Source: Ambulatory Visit | Admitting: Gastroenterology

## 2016-05-18 ENCOUNTER — Encounter: Admission: RE | Payer: Self-pay | Source: Ambulatory Visit

## 2016-05-18 SURGERY — SIGMOIDOSCOPY, FLEXIBLE
Anesthesia: General

## 2016-05-26 ENCOUNTER — Encounter: Payer: Self-pay | Admitting: *Deleted

## 2016-05-29 ENCOUNTER — Encounter: Payer: Self-pay | Admitting: *Deleted

## 2016-05-29 ENCOUNTER — Encounter
Admission: RE | Disposition: A | Discharge status: Home / Self Care | Payer: Self-pay | Source: Ambulatory Visit | Attending: Gastroenterology

## 2016-05-29 ENCOUNTER — Ambulatory Visit
Admission: RE | Admit: 2016-05-29 | Discharge: 2016-05-29 | Disposition: A | Discharge status: Home / Self Care | Payer: Managed Care, Other (non HMO) | Source: Ambulatory Visit | Attending: Gastroenterology | Admitting: Gastroenterology

## 2016-05-29 DIAGNOSIS — E7800 Pure hypercholesterolemia, unspecified: Secondary | ICD-10-CM | POA: Insufficient documentation

## 2016-05-29 DIAGNOSIS — E785 Hyperlipidemia, unspecified: Secondary | ICD-10-CM | POA: Diagnosis not present

## 2016-05-29 DIAGNOSIS — Z86018 Personal history of other benign neoplasm: Secondary | ICD-10-CM | POA: Insufficient documentation

## 2016-05-29 DIAGNOSIS — Z794 Long term (current) use of insulin: Secondary | ICD-10-CM | POA: Insufficient documentation

## 2016-05-29 DIAGNOSIS — I2510 Atherosclerotic heart disease of native coronary artery without angina pectoris: Secondary | ICD-10-CM | POA: Diagnosis not present

## 2016-05-29 DIAGNOSIS — I10 Essential (primary) hypertension: Secondary | ICD-10-CM | POA: Diagnosis not present

## 2016-05-29 DIAGNOSIS — E1142 Type 2 diabetes mellitus with diabetic polyneuropathy: Secondary | ICD-10-CM | POA: Insufficient documentation

## 2016-05-29 DIAGNOSIS — D126 Benign neoplasm of colon, unspecified: Secondary | ICD-10-CM | POA: Diagnosis not present

## 2016-05-29 DIAGNOSIS — E781 Pure hyperglyceridemia: Secondary | ICD-10-CM | POA: Diagnosis not present

## 2016-05-29 DIAGNOSIS — Z7982 Long term (current) use of aspirin: Secondary | ICD-10-CM | POA: Diagnosis not present

## 2016-05-29 HISTORY — DX: Cardiac murmur, unspecified: R01.1

## 2016-05-29 HISTORY — PX: FLEXIBLE SIGMOIDOSCOPY: SHX5431

## 2016-05-29 HISTORY — DX: Type 2 diabetes mellitus with diabetic polyneuropathy: E11.42

## 2016-05-29 HISTORY — DX: Pure hypercholesterolemia, unspecified: E78.00

## 2016-05-29 HISTORY — DX: Pure hyperglyceridemia: E78.1

## 2016-05-29 LAB — GLUCOSE, CAPILLARY: Glucose-Capillary: 151 mg/dL — ABNORMAL HIGH (ref 65–99)

## 2016-05-29 SURGERY — SIGMOIDOSCOPY, FLEXIBLE
Anesthesia: Moderate Sedation

## 2016-05-29 MED ORDER — MIDAZOLAM HCL 5 MG/5ML IJ SOLN
INTRAMUSCULAR | Status: AC
  Filled 2016-05-29: qty 5

## 2016-05-29 MED ORDER — SODIUM CHLORIDE 0.9 % IV SOLN
INTRAVENOUS | Status: DC
  Administered 2016-05-29: 1000 mL via INTRAVENOUS

## 2016-05-29 MED ORDER — FENTANYL CITRATE (PF) 100 MCG/2ML IJ SOLN
INTRAMUSCULAR | Status: DC | PRN
  Administered 2016-05-29: 25 ug via INTRAVENOUS

## 2016-05-29 MED ORDER — SODIUM CHLORIDE 0.9 % IV SOLN: INTRAVENOUS | Status: DC

## 2016-05-29 MED ORDER — FENTANYL CITRATE (PF) 100 MCG/2ML IJ SOLN
INTRAMUSCULAR | Status: AC
  Filled 2016-05-29: qty 2

## 2016-05-29 MED ORDER — MIDAZOLAM HCL 5 MG/5ML IJ SOLN
INTRAMUSCULAR | Status: DC | PRN
  Administered 2016-05-29 (×2): 2 mg via INTRAVENOUS

## 2016-05-29 NOTE — H&P (Addendum)
I have discussed the risks benefits and complications of procedures to include not limited to bleeding, infection, perforation and the risk of sedation and the patient wishes to proceed.Outpatient short stay form Pre-procedure 05/29/2016 4:33 PM Lollie Sails MD  Primary Physician: Dr. Ramonita Lab  Reason for visit:  Flexible sigmoidoscopy  History of present illness:  Patient is a 66 year old male presenting today as above. He has a personal history of a colonoscopy being done 02/14/2016. During that colonoscopy there were multiple polyps removed however there was one that was 48 mm in size about 15 cm proximal to the anal verge. He is presenting today for a flexible sigmoidoscopy to recheck this site. This site was tattooed previously.    Current Facility-Administered Medications:  .  0.9 %  sodium chloride infusion, , Intravenous, Continuous, Lollie Sails, MD, Last Rate: 20 mL/hr at 05/29/16 1508, 1,000 mL at 05/29/16 1508 .  0.9 %  sodium chloride infusion, , Intravenous, Continuous, Lollie Sails, MD  Prescriptions Prior to Admission  Medication Sig Dispense Refill Last Dose  . aspirin EC 81 MG tablet Take 81 mg by mouth daily.   05/28/2016 at Unknown time  . cholecalciferol (VITAMIN D) 1000 units tablet Take 2,000 Units by mouth daily.   05/28/2016 at Unknown time  . gabapentin (NEURONTIN) 300 MG capsule Take 300 mg by mouth 3 (three) times daily.   05/28/2016 at Unknown time  . insulin glargine (LANTUS) 100 UNIT/ML injection Inject 52 Units into the skin at bedtime.   05/28/2016 at Unknown time  . insulin lispro (HUMALOG) 100 UNIT/ML injection Inject 15-20 Units into the skin 3 (three) times daily with meals. Pt uses 15 units with breakfast, 18 units with lunch, and 20 units with dinner.   05/28/2016 at Unknown time  . lisinopril (PRINIVIL,ZESTRIL) 5 MG tablet Take 5 mg by mouth daily.   05/28/2016 at Unknown time  . pravastatin (PRAVACHOL) 80 MG tablet Take 80 mg by mouth at  bedtime.   05/28/2016 at Unknown time  . SitaGLIPtin-MetFORMIN HCl (JANUMET XR) 50-500 MG TB24 Take 1 tablet by mouth daily with supper.   05/28/2016 at Unknown time  . traZODone (DESYREL) 50 MG tablet Take 50 mg by mouth at bedtime as needed for sleep.   05/28/2016 at Unknown time     No Known Allergies   Past Medical History:  Diagnosis Date  . Coronary artery disease   . Diabetes mellitus without complication ([place])   . Diabetic neuropathy ([place])   . Diabetic peripheral neuropathy ([place])   . Gout   . Heart murmur   . Hypercholesteremia   . Hyperlipidemia   . Hypertension   . Hypertriglyceridemia   . Vitamin D deficiency     Review of systems:      Physical Exam    Heart and lungs: Regular rate and rhythm without rub or gallop lungs are bilaterally clear    HEENT: Normocephalic atraumatic eyes are anicteric    Other:     Pertinant exam for procedure: Soft nontender nondistended bowel sounds positive normoactive.    Planned proceedures: Flexible sigmoidoscopy with moderate sedation. I have discussed the risks benefits and complications of procedures to include not limited to bleeding, infection, perforation and the risk of sedation and the patient wishes to proceed.    Lollie Sails, MD Gastroenterology 05/29/2016  4:33 PM

## 2016-05-30 ENCOUNTER — Encounter: Payer: Self-pay | Admitting: Gastroenterology

## 2016-05-30 NOTE — Op Note (Signed)
[facility] Gastroenterology Patient Name: Andrew Harding Procedure Date: 05/29/2016 4:38 PM MRN: [id number] Account #: [id number] Date of Birth: 10-13-1950 Admit Type: Outpatient Age: 66 Room: [facility] ENDO ROOM 3 Gender: Male Note Status: Finalized Procedure:            Flexible Sigmoidoscopy Indications:          Personal history of rectal adenoma Providers:            Lollie Sails, MD Referring MD:         Ramonita Lab, MD (Referring MD) Medicines:            Fentanyl 25 micrograms IV, Midazolam 4 mg IV Complications:        No immediate complications. Procedure:            Pre-Anesthesia Assessment:                       - ASA Grade Assessment: III - A patient with severe                        systemic disease.                       After obtaining informed consent, the scope was passed                        under direct vision. The Colonoscope was introduced                        through the anus and advanced to the 20 cm from the                        anal verge. The flexible sigmoidoscopy was accomplished                        without difficulty. The patient tolerated the procedure                        well. Findings:      The digital rectal exam was normal.      A 1.5-2 mm probable residual polyp, superficial in appearance, was found       at 15 cm proximal to the anus, at the site of previous polypectomy. The       previous site was easily found with ink marking adjacent. The polyp was       sessile. The polyp was removed with a cold biopsy forceps with multiple       passes and good margin. Resection and retrieval were complete. There was       no other evidence of further polyp or other lesions.      The exam was otherwise without abnormality to about 20 cm. Impression:           - One 2 mm polyp at 15 cm proximal to the anus, removed                        with a cold biopsy forceps. Resected and retrieved.                       - The examination  was otherwise normal. Recommendation:       -  Low residue diet for 2 days.                       - Await pathology results. Procedure Code(s):    --- Professional ---                       [phone], Sigmoidoscopy, flexible; with biopsy, single or                        multiple Diagnosis Code(s):    --- Professional ---                       D12.6, Benign neoplasm of colon, unspecified                       Z86.018, Personal history of other benign neoplasm CPT copyright 2016 American Medical Association. All rights reserved. The codes documented in this report are preliminary and upon coder review may  be revised to meet current compliance requirements. Lollie Sails, MD 05/30/2016 10:21:50 AM This report has been signed electronically. Number of Addenda: 0 Note Initiated On: 05/29/2016 4:38 PM Total Procedure Duration: 0 hours 17 minutes 32 seconds       [facility]

## 2016-05-31 LAB — SURGICAL PATHOLOGY

## 2016-06-12 IMAGING — MR MR FOOT*R* W/O CM
4 of 5 series · 29 of 40 positions shown · non-contrast
Comparison: None.

CLINICAL DATA: Nonhealing ulcer on the right great toe.
Osteomyelitis.

EXAM:
MRI OF THE RIGHT FOOT WITHOUT CONTRAST
TECHNIQUE: Multiplanar, multisequence MR imaging was performed. No intravenous
contrast was administered.

[Series 7: T2 fat-sat · coronal · 3.0mm · 0.43mm/px · 9 of 50 slices shown]
[im 1/50]
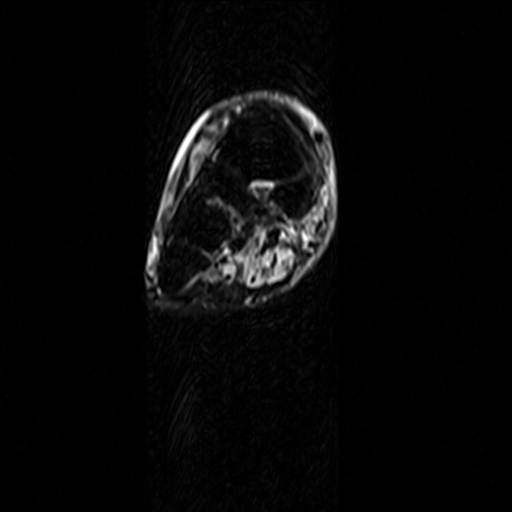
[im 7/50]
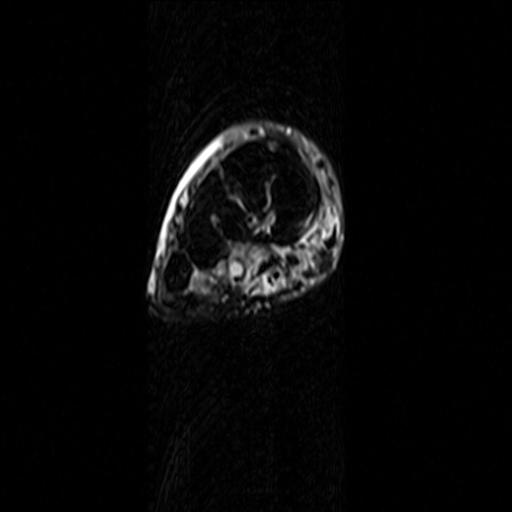
[im 13/50]
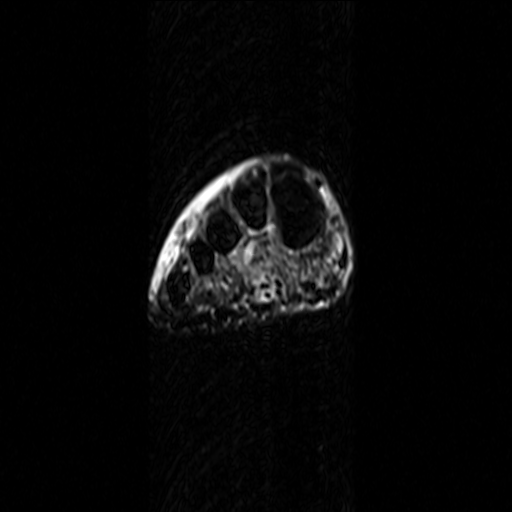
[im 19/50]
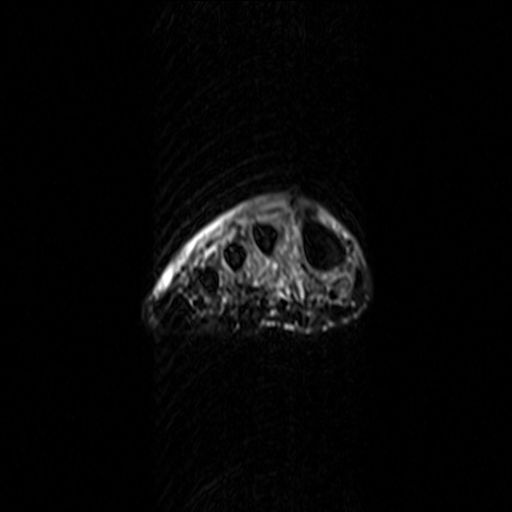
[im 25/50]
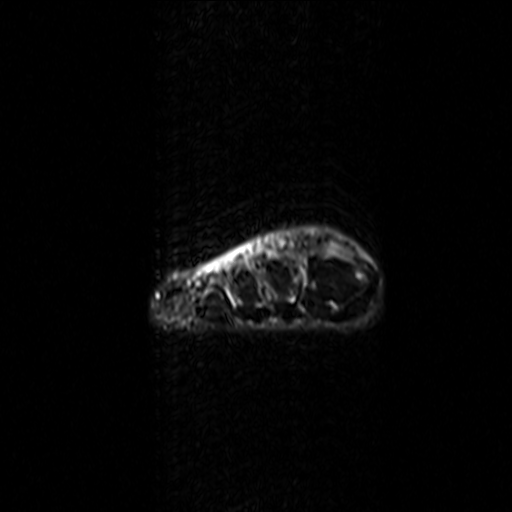
[im 31/50]
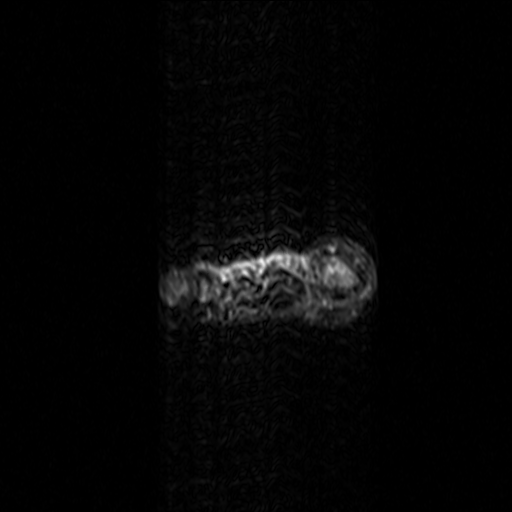
[im 37/50]
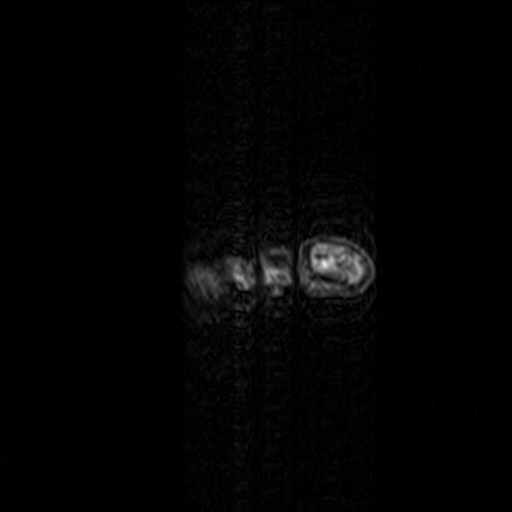
[im 43/50]
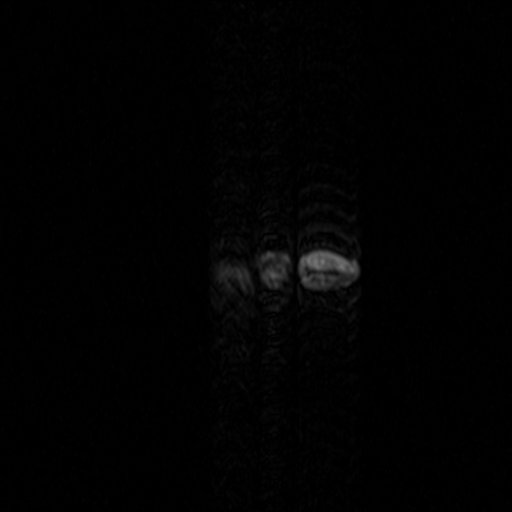
[im 50/50]
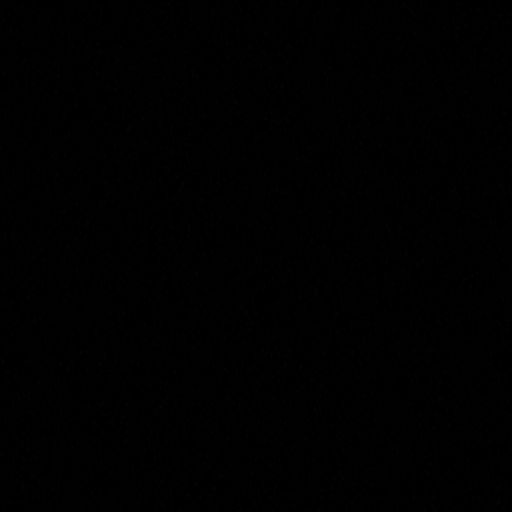

[Series 8: T1 · coronal · 3.0mm · 0.75mm/px · 8 of 50 slices shown (1 of 2)]
[im 1/50]
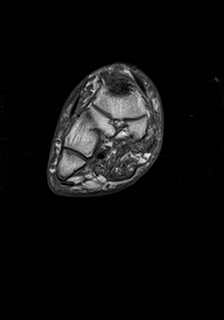
[im 6/50]
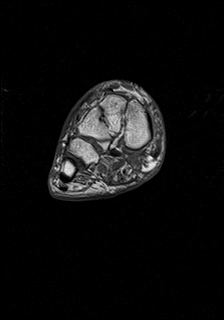
[im 17/50]
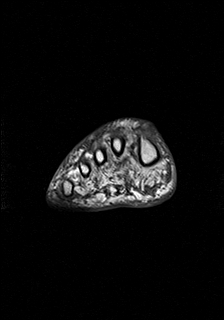
[im 22/50]
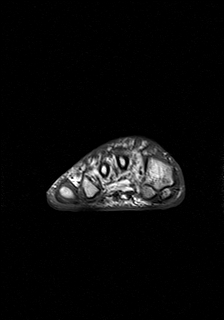
[im 28/50]
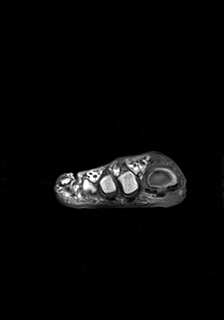
[im 33/50]
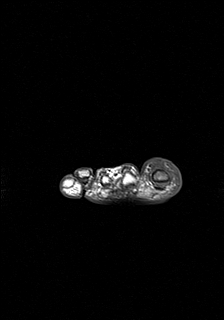
[im 44/50]
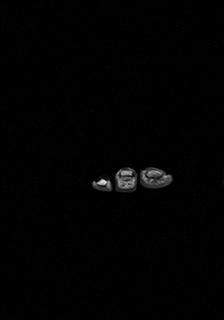
[im 50/50]
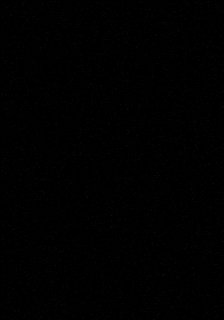

[Series 9: T1 · axial · 3.0mm · 0.78mm/px · z∈[-90,+16]mm · 7 of 35 slices shown (2 of 2)]
[im 1/35]
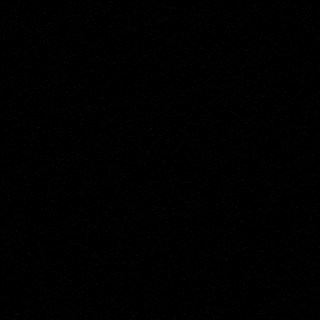
[im 6/35]
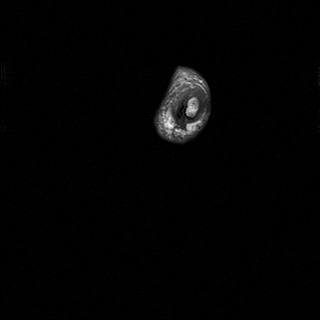
[im 12/35]
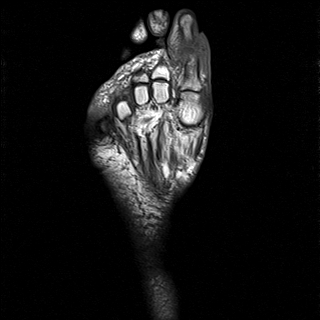
[im 18/35]
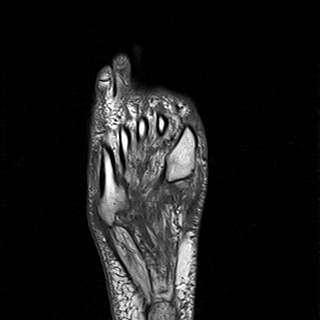
[im 23/35]
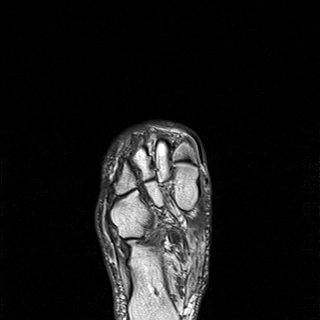
[im 29/35]
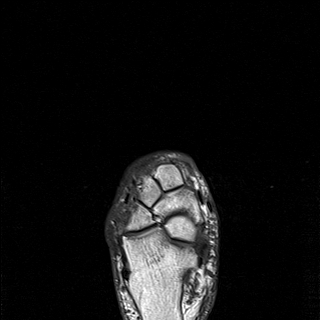
[im 35/35]
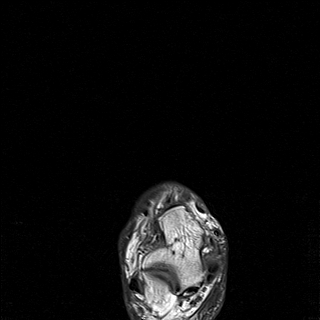

[Series 10: STIR · axial · 3.0mm · 0.49mm/px · z∈[-90,-3]mm · 5 of 35 slices shown]
[im 1/35]
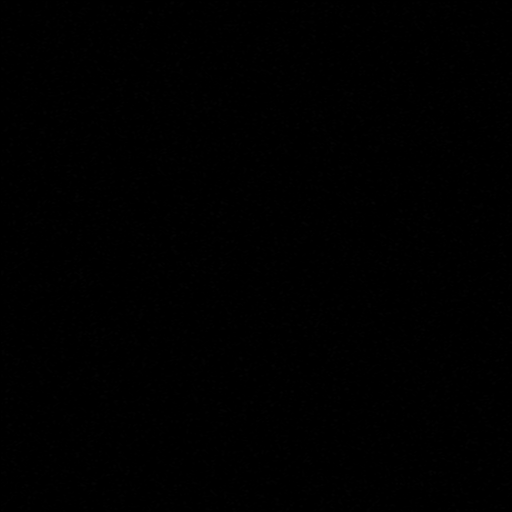
[im 6/35]
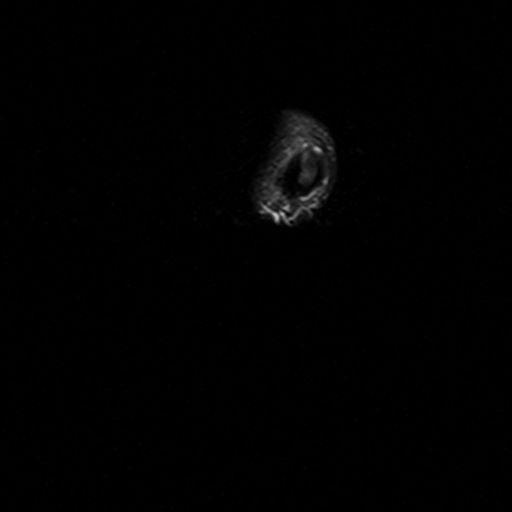
[im 12/35]
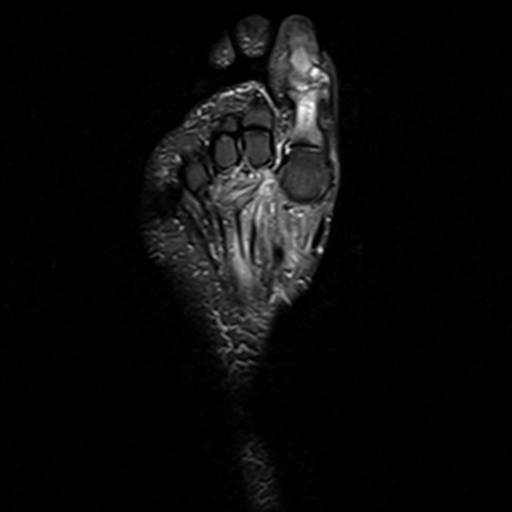
[im 18/35]
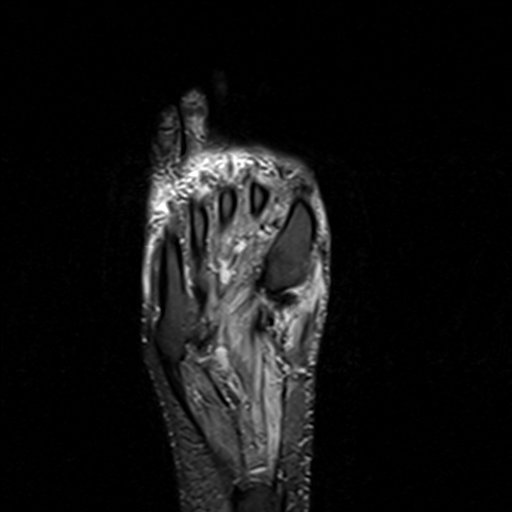
[im 29/35]
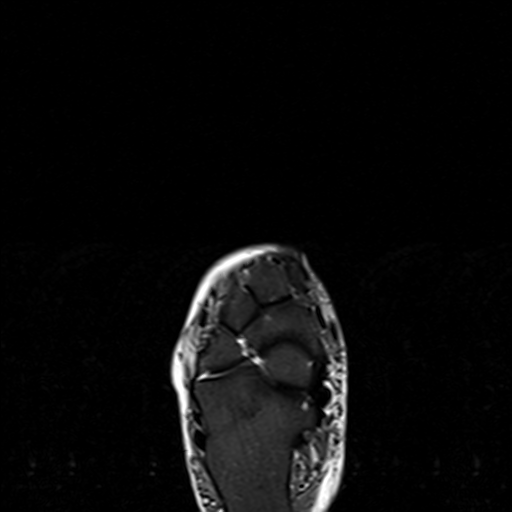

[29 of 40 positions shown; findings below may reference images not displayed]

FINDINGS: There is destruction of the proximal and distal phalanges of the
great toe at the IP joint. There is abnormal edema throughout both
phalangeal bones of the great toe consistent with osteomyelitis.
Small effusion at the IP joint of the great toe.

The other bones of the foot appear normal. No appreciable soft
tissue abscess. Slight tenosynovitis involving the posterior
tibialis and flexor digitorum longus and flexor hallucis longus
tendons.
IMPRESSION: Osteomyelitis of the proximal and distal phalangeal bones of the
great toe.

## 2016-08-04 ENCOUNTER — Ambulatory Visit: Payer: Self-pay | Admitting: Physician Assistant

## 2016-08-04 VITALS — BP 150/79 | HR 70 | Temp 97.8°F | Ht 75.0 in | Wt 262.0 lb

## 2016-08-04 DIAGNOSIS — Z0189 Encounter for other specified special examinations: Principal | ICD-10-CM

## 2016-08-04 DIAGNOSIS — Z008 Encounter for other general examination: Secondary | ICD-10-CM

## 2016-08-04 NOTE — Progress Notes (Signed)
Patient came in to have his biometric screening done.  Patient expressed that he has had his physical and labs done by his own personal care physician.

## 2016-09-07 ENCOUNTER — Ambulatory Visit: Payer: Self-pay | Admitting: Physician Assistant

## 2017-06-11 ENCOUNTER — Encounter: Payer: Self-pay | Admitting: Family Medicine

## 2017-06-11 ENCOUNTER — Ambulatory Visit: Payer: Self-pay | Admitting: Family Medicine

## 2017-06-11 VITALS — BP 120/70 | HR 91 | Temp 99.2°F | Resp 16

## 2017-06-11 DIAGNOSIS — J0110 Acute frontal sinusitis, unspecified: Secondary | ICD-10-CM

## 2017-06-11 DIAGNOSIS — R05 Cough: Secondary | ICD-10-CM

## 2017-06-11 DIAGNOSIS — R059 Cough, unspecified: Secondary | ICD-10-CM

## 2017-06-11 MED ORDER — AMOXICILLIN-POT CLAVULANATE 875-125 MG PO TABS: 1.0000 | ORAL_TABLET | Freq: Two times a day (BID) | ORAL | 0 refills | Status: DC

## 2017-06-11 MED ORDER — BENZONATATE 100 MG PO CAPS: 100.0000 mg | ORAL_CAPSULE | Freq: Three times a day (TID) | ORAL | 0 refills | Status: DC | PRN

## 2017-06-11 NOTE — Patient Instructions (Signed)
Drink plenty of fluids and get enough rest  Take Augmentin 875 mg 1 twice daily for infection (amoxicillin/clavulanate)  Take over-the-counter Claritin-D or Allegra-D for the head congestion.  Use the benzonatate cough pills 1 or 2 pills 3 times daily as needed for daytime cough.  This is nonsedating.  Take the Mucinex DM cough syrup which you can buy over-the-counter to help thin the secretions and calm the cough.  Return if not improving.

## 2017-06-11 NOTE — Progress Notes (Signed)
Patient ID: Andrew Harding, male    DOB: 17-Sep-1950  Age: 67 y.o. MRN: [id number]  Chief Complaint  Patient presents with  . URI    Subjective:   About a week ago the patient had a respiratory tract infection with head congestion and cough.  He improved but this weekend he got worse again.  He has been running a low-grade fever.  He has facial pain.  Not blowing his much out of his nose but is congested.  He is coughing up mucus, some it feels like it is postnasal drainage and some for deeper in his chest.  He does not smoke.  He did have a flu shot.  He is diabetic and yesterday morning his glucose was 127.  Current allergies, medications, problem list, past/family and social histories reviewed.  Objective:  BP 120/70   Pulse 91   Temp 99.2 F (37.3 C) (Oral)   Resp 16   SpO2 97%  He does not feel well.  When I finished checking him he wanted to lay back down indicating he really does feel bad.  His TMs are normal.  Sinuses tender.  More frontal than maxillary.  Throat clear.  Neck supple without nodes.  Chest clear to auscultation except for 1 tiny rhonchi was heard.  Heart regular without murmur. Assessment & Plan:   Assessment: 1. Acute non-recurrent frontal sinusitis   2. Cough       Plan: Treat with antibiotics for a post viral sinusitis and cough.  No orders of the defined types were placed in this encounter.   No orders of the defined types were placed in this encounter.        Patient Instructions  Drink plenty of fluids and get enough rest  Take Augmentin 875 mg 1 twice daily for infection (amoxicillin/clavulanate)  Take over-the-counter Claritin-D or Allegra-D for the head congestion.  Use the benzonatate cough pills 1 or 2 pills 3 times daily as needed for daytime cough.  This is nonsedating.  Take the Mucinex DM cough syrup which you can buy over-the-counter to help thin the secretions and calm the cough.  Return if not improving.    Return if  symptoms worsen or fail to improve.   Aryana Wonnacott, MD 06/11/2017

## 2017-06-11 NOTE — Addendum Note (Signed)
Addended by: Esli Clements H on: 06/11/2017 10:20 AM   Modules accepted: Orders

## 2017-07-17 ENCOUNTER — Ambulatory Visit: Payer: Self-pay | Admitting: Family Medicine

## 2017-07-17 VITALS — BP 132/80 | HR 61 | Temp 98.3°F | Resp 16 | Ht 75.0 in | Wt 249.0 lb

## 2017-07-17 DIAGNOSIS — Z0000 Encounter for general adult medical examination without abnormal findings: Secondary | ICD-10-CM

## 2017-07-17 NOTE — Progress Notes (Signed)
Subjective: Annual biometrics screening  Patient presents for his annual biometric screening.  Patient reports that he only needs a physical exam for his biometric screening and that he  has already submitted the lab work.  PCP: Dr. Caryl Comes.  Reports seeing him regularly. Patient works as a Office manager, who sits at entrance of the building. Patient denies any other issues or concerns.   Review of Systems Constitutional: Unremarkable.  HEENT: Denies dizziness, issues with hearing, vision problems.  Respiratory: Unremarkable.   Cardiovascular: Unremarkable.  ROS otherwise negative.   Objective  Physical Exam General: Awake, alert and oriented. No acute distress. Well developed, hydrated and nourished. Appears stated age.  HEENT:  Supple neck without adenopathy. Sclera is non-icteric. The ear canal is clear without discharge. The tympanic membrane is normal in appearance with normal landmarks and cone of light. Nasal mucosa is pink and moist. Oral mucosa is pink and moist. The pharynx is normal in appearance without tonsillar swelling or exudates.  Skin: Skin in warm, dry and intact without rashes or lesions. Appropriate color for ethnicity. Cardiac: Heart rate and rhythm are normal. No murmurs, gallops, or rubs are auscultated.  Respiratory: The chest wall is symmetric and without deformity. No signs of respiratory distress. Lung sounds are clear in all lobes bilaterally without rales, ronchi, or wheezes.  Neurological: The patient is awake, alert and oriented to person, place, and time with normal speech.  Memory is normal and thought processes intact. No gait abnormalities are appreciated.  Psychiatric: Appropriate mood and affect.   Assessment Annual biometrics screen physical exam  Plan  Encouraged routine visits with primary care provider.

## 2017-12-21 DIAGNOSIS — L97521 Non-pressure chronic ulcer of other part of left foot limited to breakdown of skin: Secondary | ICD-10-CM

## 2017-12-21 DIAGNOSIS — E13621 Other specified diabetes mellitus with foot ulcer: Secondary | ICD-10-CM

## 2017-12-21 DIAGNOSIS — E11621 Type 2 diabetes mellitus with foot ulcer: Secondary | ICD-10-CM | POA: Insufficient documentation

## 2017-12-21 HISTORY — DX: Non-pressure chronic ulcer of other part of left foot limited to breakdown of skin: E13.621

## 2018-04-05 ENCOUNTER — Ambulatory Visit: Payer: Self-pay | Admitting: Emergency Medicine

## 2018-04-05 ENCOUNTER — Encounter: Payer: Self-pay | Admitting: Emergency Medicine

## 2018-04-05 VITALS — BP 132/68 | HR 65 | Temp 97.7°F | Resp 14

## 2018-04-05 DIAGNOSIS — M109 Gout, unspecified: Secondary | ICD-10-CM | POA: Insufficient documentation

## 2018-04-05 DIAGNOSIS — B3742 Candidal balanitis: Secondary | ICD-10-CM

## 2018-04-05 DIAGNOSIS — E559 Vitamin D deficiency, unspecified: Secondary | ICD-10-CM | POA: Insufficient documentation

## 2018-04-05 DIAGNOSIS — R300 Dysuria: Secondary | ICD-10-CM

## 2018-04-05 LAB — POCT URINALYSIS DIPSTICK
Bilirubin, UA: NEGATIVE
Blood, UA: NEGATIVE
Glucose, UA: NEGATIVE
Ketones, UA: NEGATIVE
Leukocytes, UA: NEGATIVE
Nitrite, UA: NEGATIVE
Protein, UA: NEGATIVE
Spec Grav, UA: 1.02 (ref 1.010–1.025)
Urobilinogen, UA: 0.2 E.U./dL
pH, UA: 5 (ref 5.0–8.0)

## 2018-04-05 MED ORDER — KETOCONAZOLE 2 % EX CREA: 1.0000 "application " | TOPICAL_CREAM | Freq: Two times a day (BID) | CUTANEOUS | 1 refills | Status: DC

## 2018-04-05 MED ORDER — FLUCONAZOLE 150 MG PO TABS: ORAL_TABLET | ORAL | 1 refills | Status: DC

## 2018-04-05 NOTE — Progress Notes (Signed)
[facility]   Patient ID: BAKER MORONTA DOB: 67 y.o. MRN: [id number]   Subjective: 1 week of worsening redness and itch and irritation around the glans penis and foreskin.  He states he occasionally gets yeast infections in this area, especially prone to this because of not being circumcised and his diabetes.  He admits he has not been optimally compliant with diabetes diet recently.  He checked fasting glucose this morning fingerstick at home, was 124. Minimal dysuria, he feels is from irritation of the skin around the urethral meatus.  No change in urination otherwise.  Denies fever or chills or nausea or vomiting.  I reviewed his extensive past medical history.  Pertinent items noted in HPI and remainder of comprehensive ROS otherwise negative.   Objective: Pleasant male, alert, no distress. Blood pressure 132/68, pulse 65, temperature 97.7 F (36.5 C), temperature source Oral, resp. rate 14, SpO2 99 %. Lungs: Equal expansion Heart, regular rate and rhythm Abdomen, nondistended GU: Noncircumcised male.  Redness and irritation diffusely glans penis and foreskin, he is able to retract the foreskin but it is uncomfortable for him.  No discharge or other lesions seen.  UA today within normal limits.  Negative for glucose or blood or leukocytes or nitrates. Glucose this morning 124.   Assessment: Yeast balanitis.  Plan:  Treatment options discussed, as well as risks, benefits, alternatives. Patient voiced understanding and agreement with the following plans:  New Prescriptions   FLUCONAZOLE (DIFLUCAN) 150 MG TABLET    Take 1 by mouth daily for 7 days.  For yeast infection   KETOCONAZOLE (NIZORAL) 2 % CREAM    Apply 1 application topically 2 (two) times daily. For yeast infection     Other symptomatic care discussed. He understands that he needs to control diabetes, and use good hygiene.  Keep follow-up appointment with endocrinologist,  appointment scheduled for next month. Also, advised to follow-up with your primary care doctor or dermatologist in 5-7 days if not improving, or sooner if symptoms become worse. Precautions discussed. Red flags discussed. Questions invited and answered. Patient voiced understanding and agreement.

## 2018-12-04 ENCOUNTER — Encounter: Payer: Self-pay | Admitting: Emergency Medicine

## 2018-12-04 ENCOUNTER — Emergency Department
Admission: EM | Admit: 2018-12-04 | Discharge: 2018-12-04 | Disposition: A | Discharge status: Home / Self Care | Payer: Managed Care, Other (non HMO) | Attending: Emergency Medicine | Admitting: Emergency Medicine

## 2018-12-04 ENCOUNTER — Other Ambulatory Visit: Payer: Self-pay

## 2018-12-04 DIAGNOSIS — E1122 Type 2 diabetes mellitus with diabetic chronic kidney disease: Secondary | ICD-10-CM | POA: Insufficient documentation

## 2018-12-04 DIAGNOSIS — Y9301 Activity, walking, marching and hiking: Secondary | ICD-10-CM | POA: Insufficient documentation

## 2018-12-04 DIAGNOSIS — S91114A Laceration without foreign body of right lesser toe(s) without damage to nail, initial encounter: Secondary | ICD-10-CM | POA: Diagnosis not present

## 2018-12-04 DIAGNOSIS — N183 Chronic kidney disease, stage 3 (moderate): Secondary | ICD-10-CM | POA: Diagnosis not present

## 2018-12-04 DIAGNOSIS — S99922A Unspecified injury of left foot, initial encounter: Secondary | ICD-10-CM | POA: Diagnosis present

## 2018-12-04 DIAGNOSIS — Z7982 Long term (current) use of aspirin: Secondary | ICD-10-CM | POA: Insufficient documentation

## 2018-12-04 DIAGNOSIS — Y999 Unspecified external cause status: Secondary | ICD-10-CM | POA: Insufficient documentation

## 2018-12-04 DIAGNOSIS — Z79899 Other long term (current) drug therapy: Secondary | ICD-10-CM | POA: Diagnosis not present

## 2018-12-04 DIAGNOSIS — W2209XA Striking against other stationary object, initial encounter: Secondary | ICD-10-CM | POA: Diagnosis not present

## 2018-12-04 DIAGNOSIS — I129 Hypertensive chronic kidney disease with stage 1 through stage 4 chronic kidney disease, or unspecified chronic kidney disease: Secondary | ICD-10-CM | POA: Diagnosis not present

## 2018-12-04 DIAGNOSIS — Y929 Unspecified place or not applicable: Secondary | ICD-10-CM | POA: Diagnosis not present

## 2018-12-04 DIAGNOSIS — I2510 Atherosclerotic heart disease of native coronary artery without angina pectoris: Secondary | ICD-10-CM | POA: Diagnosis not present

## 2018-12-04 DIAGNOSIS — Z794 Long term (current) use of insulin: Secondary | ICD-10-CM | POA: Insufficient documentation

## 2018-12-04 LAB — GLUCOSE, CAPILLARY: Glucose-Capillary: 98 mg/dL (ref 70–99)

## 2018-12-04 MED ORDER — CEPHALEXIN 500 MG PO CAPS: 500.0000 mg | ORAL_CAPSULE | Freq: Three times a day (TID) | ORAL | 0 refills | Status: DC

## 2018-12-04 MED ORDER — CEPHALEXIN 500 MG PO CAPS
500.0000 mg | ORAL_CAPSULE | Freq: Once | ORAL | Status: AC
  Administered 2018-12-04: 500 mg via ORAL
  Filled 2018-12-04: qty 1

## 2018-12-04 NOTE — Discharge Instructions (Signed)
Keep the wound clean, dry, and covered. Take the prescription meds as directed. Follow-up with Dr. Elvina Mattes for ongoing care.

## 2018-12-04 NOTE — ED Provider Notes (Signed)
[facility] Emergency Department Provider Note ____________________________________________  Time seen: 2037  I have reviewed the triage vital signs and the nursing notes.  HISTORY  Chief Complaint  Foot Injury  HPI Andrew Harding is a 68 y.o. male presents himself to the ED for evaluation and management of a laceration or wound to the left second toe.  Patient with a history of diabetes and diabetic neuropathy, presents for evaluation.  He has previously had a left great toe amputation secondary to his diabetic neuropathy.  He noted some blood to his sock yesterday after he came in from work.  He had been walking around the garage and his athletic sandals, when he accidentally missed the step coming into the house.  He believes he caught his toes on the ledge which caused a hyper extension injury.  He later noted a laceration to the plantar surface of the left second toe at the DIP.  He presents now with a wound to the foot without active bleeding.  He only noted the wound because of the bleed into a sock and was not aware of any discomfort secondary to neuropathy.  He presents now for evaluation of concern of a possible infection.  Denies any fevers, chills, sweats.  Past Medical History:  Diagnosis Date  . Coronary artery disease   . Diabetes mellitus without complication ([place])   . Diabetic neuropathy ([place])   . Diabetic peripheral neuropathy ([place])   . Gout   . Heart murmur   . Hypercholesteremia   . Hyperlipidemia   . Hypertension   . Hypertriglyceridemia   . Vitamin D deficiency     Patient Active Problem List   Diagnosis Date Noted  . Gout 04/05/2018  . Vitamin D deficiency, unspecified 04/05/2018  . Diabetic ulcer of toe of left foot associated with type 2 diabetes mellitus, limited to breakdown of skin ([place]) 12/21/2017  . Status post amputation of toe of right foot ([place]) 11/01/2015  . CKD (chronic kidney disease) stage 3, GFR 30-59 ml/min ([facility]) 10/08/2015   . 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 Osteomyelitis ([place]) 05/31/2015  . Type 2 diabetes mellitus with other specified complication ([place]) 72/53/6644  . History of osteomyelitis 05/31/2015  . Benign essential hypertension 09/14/2014  . Coronary artery disease 09/17/2012  . Mixed hyperlipidemia 09/17/2012    Past Surgical History:  Procedure Laterality Date  . AMPUTATION TOE Right 06/01/2015   Procedure: AMPUTATION TOE;  Surgeon: Albertine Patricia, DPM;  Location: [facility] ORS;  Service: Podiatry;  Laterality: Right;  . CATARACT EXTRACTION, BILATERAL    . COLONOSCOPY WITH PROPOFOL N/A 02/14/2016   Procedure: COLONOSCOPY WITH PROPOFOL;  Surgeon: Lollie Sails, MD;  Location: [facility] ENDOSCOPY;  Service: Endoscopy;  Laterality: N/A;  . CORONARY ARTERY BYPASS GRAFT    . EXCISION PARTIAL PHALANX Right 06/01/2015   Procedure: EXCISION PARTIAL PHALANX /  BONE;  Surgeon: Albertine Patricia, DPM;  Location: [facility] ORS;  Service: Podiatry;  Laterality: Right;  . FLEXIBLE SIGMOIDOSCOPY N/A 05/29/2016   Procedure: FLEXIBLE SIGMOIDOSCOPY;  Surgeon: Lollie Sails, MD;  [facility] END[facility]COPY;  Service: Endoscopy;  Laterality: N/A;  . KNEE ARTHROSCOPY     Left knee     Prior to Admission medications   Medication Sig Start Date End Date Taking? Authorizing Provider  aspirin EC 81 MG tablet Take 81 mg by mouth daily.    [provider]  cephALEXin (KEFLEX) 500 MG capsule Take 1 capsule (500 mg total) by mouth 3 (three) times daily.  12/04/18   Jasmond River, Dannielle Karvonen, PA-C  cholecalciferol (VITAMIN D) 1000 units tablet Take 2,000 Units by mouth daily.    [provider]  fluconazole (DIFLUCAN) 150 MG tablet Take 1 by mouth daily for 7 days.  For yeast infection 04/05/18   Jacqulyn Cane, MD  gabapentin (NEURONTIN) 300 MG capsule Take 300 mg by mouth 3 (three) times daily.    [provider]  insulin glargine (LANTUS) 100 UNIT/ML injection Inject 52 Units into the skin at bedtime.     [provider]  Insulin Lispro (HUMALOG KWIKPEN) 200 UNIT/ML SOPN INJECT 15-20 UNITS 3 TIMES A DAY 12/17/17   [provider]  Insulin Pen Needle (BD PEN NEEDLE NANO U/F) 32G X 4 MM MISC Use 4 times daily 07/03/17   [provider]  ketoconazole (NIZORAL) 2 % cream Apply 1 application topically 2 (two) times daily. For yeast infection 04/05/18   Jacqulyn Cane, MD  lisinopril (PRINIVIL,ZESTRIL) 5 MG tablet Take 5 mg by mouth daily.    [provider]  pravastatin (PRAVACHOL) 80 MG tablet Take 80 mg by mouth at bedtime.    [provider]  SitaGLIPtin-MetFORMIN HCl (JANUMET XR) 50-1000 MG TB24 TAKE 2 TABLETS BY MOUTH DAILY 10/09/17   [provider]  traZODone (DESYREL) 50 MG tablet Take 50 mg by mouth at bedtime as needed for sleep.    [provider]    Allergies Patient has no known allergies.  Family History  Problem Relation Age of Onset  . Diabetes Mellitus II Mother   . CAD Mother     Social History Social History   Tobacco Use  . Smoking status: Never Smoker  . Smokeless tobacco: Never Used  Substance Use Topics  . Alcohol use: No  . Drug use: No    Review of Systems  Constitutional: Negative for fever. Eyes: Negative for visual changes. ENT: Negative for sore throat. Cardiovascular: Negative for chest pain. Respiratory: Negative for shortness of breath. Gastrointestinal: Negative for abdominal pain, vomiting and diarrhea. Genitourinary: Negative for dysuria. Musculoskeletal: Negative for back pain. Skin: Negative for rash.  Left second toe laceration as above. Neurological: Negative for headaches, focal weakness or numbness. ____________________________________________  PHYSICAL EXAM:  VITAL SIGNS: ED Triage Vitals [12/04/18 1852]  Enc Vitals Group     BP (!) 173/72     Pulse Rate 81     Resp 16     Temp 99.1 F (37.3 C)     Temp Source Oral     SpO2 95 %     Weight 255 lb (115.7 kg)      Height 6\' 3"  (1.905 m)     Head Circumference      Peak Flow      Pain Score 0     Pain Loc      Pain Edu?      Excl. in [place]?     Constitutional: Alert and oriented. Well appearing and in no distress. Head: Normocephalic and atraumatic. Eyes: Conjunctivae are normal. Normal extraocular movements Cardiovascular: Normal rate, regular rhythm. Normal distal pulses. Respiratory: Normal respiratory effort.  Musculoskeletal: Nontender with normal range of motion in all extremities.  Neurologic:  Normal gait without ataxia. Normal speech and language. No gross focal neurologic deficits are appreciated. Skin:  Skin is warm, dry and intact. No rash noted.  Left foot without obvious deformity or dislocation.  The great toe is surgically removed and the second toe is without any signs of acute superficial  infection or deformity.  Patient is noted to have a linear laceration to the plantar surface of the foot at the crease of the DIP.  No active bleeding or signs of infection appreciated.  Wound edges are clean and there is no erythema or purulence noted. ____________________________________________   LABS (pertinent positives/negatives) Labs Reviewed  GLUCOSE, CAPILLARY  ____________________________________________  PROCEDURES  Procedures Keflex 500 mg PO Wound dressing ____________________________________________  INITIAL IMPRESSION / ASSESSMENT AND PLAN / ED COURSE  Andrew Harding was evaluated in Emergency Department on 12/04/2018 for the symptoms described in the history of present illness. He was evaluated in the context of the global COVID-19 pandemic, which necessitated consideration that the patient might be at risk for infection with the SARS-CoV-2 virus that causes COVID-19. Institutional protocols and algorithms that pertain to the evaluation of patients at risk for COVID-19 are in a state of rapid change based on information released by regulatory bodies including the CDC and federal and  state organizations. These policies and algorithms were followed during the patient's care in the ED.  Patient with a history of diabetes as well as diabetic neuropathy presents with a accidental laceration to the plantar surface of the left foot after mechanical injury.  Patient presents more than 24 hours after the injury.  We discussed the increased risk of infection with attempts to close the wound with sutures and a wound glue.  Patient is agreeable to plan to allow the wound to heal by secondary intent.  He will be placed on prophylactic antibiotics, and the wound will be dressed with a dry sterile bandage.  He is discharged with wound care instructions and supplies.  He will follow-up with his podiatrist as discussed peer return precautions have been reviewed. ____________________________________________  FINAL CLINICAL IMPRESSION(S) / ED DIAGNOSES  Final diagnoses:  Laceration of lesser toe of right foot without foreign body present or damage to nail, initial encounter      Melvenia Needles, PA-C 12/04/18 2333    Vanessa Imperial, MD 12/06/18 1719

## 2018-12-04 NOTE — ED Triage Notes (Signed)
FIRST NURSE NOTE-pt reports toe split open and not sure why. Is diabetic.  Ambulatory without distress.

## 2018-12-04 NOTE — ED Notes (Signed)
Dressing applied to the affected toe.

## 2018-12-04 NOTE — ED Triage Notes (Signed)
Patient states this afternoon when he got off work, he noticed blood on his socks. Small laceration noted to right second toe. Right great toe amputated several years ago. Patient states he is diabetic and has neuropathy. No active bleeding seen in triage.

## 2018-12-04 NOTE — ED Notes (Signed)
Pt to the er for wound to the 2nd difit of the left foot. Pt is a diabetic and has the great toe on the right foot amputated. Pt says last night his foot slipped while wearing flip flops but due to neuropathy didn't feel any pain or think anything of it. Pt today noted after working he had blood in his sock. Pt has a small split on the palmar side of the 2nd digit. No bleeding at this time. Due to diabetic status, would like it checked.

## 2019-01-02 ENCOUNTER — Ambulatory Visit: Payer: Medicare Other | Admitting: Adult Health

## 2019-01-02 ENCOUNTER — Encounter: Payer: Self-pay | Admitting: Adult Health

## 2019-01-02 ENCOUNTER — Other Ambulatory Visit: Payer: Self-pay

## 2019-01-02 VITALS — BP 122/62 | HR 63 | Temp 98.3°F | Resp 16 | Ht 75.0 in | Wt 255.0 lb

## 2019-01-02 DIAGNOSIS — Z008 Encounter for other general examination: Secondary | ICD-10-CM

## 2019-01-02 DIAGNOSIS — Z0189 Encounter for other specified special examinations: Secondary | ICD-10-CM

## 2019-01-02 NOTE — Patient Instructions (Signed)
I will have the office call you on your glucose and cholesterol results when they return if you have not heard within 1 week please call the office.  This biometric physical is a brief physical and the only labs done are glucose and your lipid panel(cholesterol) and is  not a substitute for seeing a primary care provider for a complete annual physical. Please see a primary care physician for routine health maintenance, labs and full physical at least yearly and follow up as recommended by your provider. Provider also recommends if you do not have a primary care provider for patient to establish care as soon as possible .Patient may chose provider of choice. Also gave the [place] at [phone] or web site at [email or web address] to help assist with finding a primary care doctor.  Patient verbalizes understanding that his office is acute care only and not a substitute for a primary care or for the management of chronic conditions.     Follow up with primary care as needed for chronic and maintenance health care- can be seen in this employee clinic for acute care. ,  Health Maintenance, Male Adopting a healthy lifestyle and getting preventive care are important in promoting health and wellness. Ask your health care provider about:  The right schedule for you to have regular tests and exams.  Things you can do on your own to prevent diseases and keep yourself healthy. What should I know about diet, weight, and exercise? Eat a healthy diet   Eat a diet that includes plenty of vegetables, fruits, low-fat dairy products, and lean protein.  Do not eat a lot of foods that are high in solid fats, added sugars, or sodium. Maintain a healthy weight Body mass index (BMI) is a measurement that can be used to identify possible weight problems. It estimates body fat based on height and weight. Your health care provider can help determine your BMI and help you achieve  or maintain a healthy weight. Get regular exercise Get regular exercise. This is one of the most important things you can do for your health. Most adults should:  Exercise for at least 150 minutes each week. The exercise should increase your heart rate and make you sweat (moderate-intensity exercise).  Do strengthening exercises at least twice a week. This is in addition to the moderate-intensity exercise.  Spend less time sitting. Even light physical activity can be beneficial. Watch cholesterol and blood lipids Have your blood tested for lipids and cholesterol at 69 years of age, then have this test every 5 years. You may need to have your cholesterol levels checked more often if:  Your lipid or cholesterol levels are high.  You are older than 69 years of age.  You are at high risk for heart disease. What should I know about cancer screening? Many types of cancers can be detected early and may often be prevented. Depending on your health history and family history, you may need to have cancer screening at various ages. This may include screening for:  Colorectal cancer.  Prostate cancer.  Skin cancer.  Lung cancer. What should I know about heart disease, diabetes, and high blood pressure? Blood pressure and heart disease  High blood pressure causes heart disease and increases the risk of stroke. This is more likely to develop in people who have high blood pressure readings, are of African descent, or are overweight.  Talk with your health care provider about your target  blood pressure readings.  Have your blood pressure checked: ? Every 3-5 years if you are 55-33 years of age. ? Every year if you are 88 years old or older.  If you are between the ages of 61 and 22 and are a current or former smoker, ask your health care provider if you should have a one-time screening for abdominal aortic aneurysm (AAA). Diabetes Have regular diabetes screenings. This checks your fasting  blood sugar level. Have the screening done:  Once every three years after age 7 if you are at a normal weight and have a low risk for diabetes.  More often and at a younger age if you are overweight or have a high risk for diabetes. What should I know about preventing infection? Hepatitis B If you have a higher risk for hepatitis B, you should be screened for this virus. Talk with your health care provider to find out if you are at risk for hepatitis B infection. Hepatitis C Blood testing is recommended for:  Everyone born from [birth year] through 1966.  Anyone with known risk factors for hepatitis C. Sexually transmitted infections (STIs)  You should be screened each year for STIs, including gonorrhea and chlamydia, if: ? You are sexually active and are younger than 69 years of age. ? You are older than 69 years of age and your health care provider tells you that you are at risk for this type of infection. ? Your sexual activity has changed since you were last screened, and you are at increased risk for chlamydia or gonorrhea. Ask your health care provider if you are at risk.  Ask your health care provider about whether you are at high risk for HIV. Your health care provider may recommend a prescription medicine to help prevent HIV infection. If you choose to take medicine to prevent HIV, you should first get tested for HIV. You should then be tested every 3 months for as long as you are taking the medicine. Follow these instructions at home: Lifestyle  Do not use any products that contain nicotine or tobacco, such as cigarettes, e-cigarettes, and chewing tobacco. If you need help quitting, ask your health care provider.  Do not use street drugs.  Do not share needles.  Ask your health care provider for help if you need support or information about quitting drugs. Alcohol use  Do not drink alcohol if your health care provider tells you not to drink.  If you drink alcohol: ? Limit how  much you have to 0-2 drinks a day. ? Be aware of how much alcohol is in your drink. In the [place] one drink equals one 12 oz bottle of beer (355 mL), one 5 oz glass of wine (148 mL), or one 1 oz glass of hard liquor (44 mL). General instructions  Schedule regular health, dental, and eye exams.  Stay current with your vaccines.  Tell your health care provider if: ? You often feel depressed. ? You have ever been abused or do not feel safe at home. Summary  Adopting a healthy lifestyle and getting preventive care are important in promoting health and wellness.  Follow your health care provider's instructions about healthy diet, exercising, and getting tested or screened for diseases.  Follow your health care provider's instructions on monitoring your cholesterol and blood pressure. This information is not intended to replace advice given to you by your health care provider. Make sure you discuss any questions you have with your health care provider. Document Released:  10/14/2007 Document Revised: 04/10/2018 Document Reviewed: 04/10/2018 Elsevier Patient Education  [place].

## 2019-01-02 NOTE — Progress Notes (Signed)
Andrew Harding DOB: 68 y.o. MRN: [id number]  Subjective:  Here for Biometric Screen/brief exam Patient is here for his biometric screening this morning he works for the [employer].  He is here for a brief exam.  He reports he has had his yearly physical with Dr. Ramonita Lab, he has no concerns at today's visit.  He tries to eat a healthy diet and remains active as a Engineer, agricultural.  He is an insulin-dependent diabetic reports his blood sugars are doing well he has had no hypoglycemic or hyperglycemic episodes. He has hyperlipidemia and is treated with Pravachol.  Patient  denies any fever, body aches,chills, rash, chest pain, shortness of breath, nausea, vomiting, or diarrhea.    Objective: Blood pressure 122/62, pulse 63, temperature 98.3 F (36.8 C), temperature source Oral, resp. rate 16, height 6\' 3"  (1.905 m), weight 255 lb (115.7 kg). NAD, well-developed well-nourished male HEENT: Within normal limits Neck: Normal, supple, no cervical lymphadenopathy Heart: Regular rate and rhythm without any murmurs rubs or gallops noted Lungs: Clear  to auscultation without any adventitious lung sounds  Assessment: Biometric screen 1. Encounter for other general examination-not a full annual exam only brief exam with biometric screening   2. Encounter for biometric screening      Plan:  Fasting glucose and lipids. Discussed with patient that today's visit here is a limited biometric screening visit (not a comprehensive exam or management of any chronic problems) Discussed some health issues, including healthy eating habits and exercise. Encouraged to follow-up with PCP for annual comprehensive preventive and wellness care (and if applicable, any chronic issues). Questions invited and answered.  I will have the office call you on your glucose and cholesterol results when they return if you have not heard within 1  week please call the office.  This biometric physical is a brief physical and the only labs done are glucose and your lipid panel(cholesterol) and is  not a substitute for seeing a primary care provider for a complete annual physical. Please see a primary care physician for routine health maintenance, labs and full physical at least yearly and follow up as recommended by your provider. Provider also recommends if you do not have a primary care provider for patient to establish care as soon as possible .Patient may chose provider of choice. Also gave the [place] at [phone] or web site at [email or web address] to help assist with finding a primary care doctor.  Patient verbalizes understanding that his office is acute care only and not a substitute for a primary care or for the management of chronic conditions.     Follow up with primary care as needed for chronic and maintenance health care- can be seen in this employee clinic for acute care. ,

## 2019-01-03 ENCOUNTER — Other Ambulatory Visit
Admission: RE | Admit: 2019-01-03 | Discharge: 2019-01-03 | Disposition: A | Discharge status: Home / Self Care | Payer: Managed Care, Other (non HMO) | Source: Ambulatory Visit | Attending: Gastroenterology | Admitting: Gastroenterology

## 2019-01-03 DIAGNOSIS — Z01812 Encounter for preprocedural laboratory examination: Secondary | ICD-10-CM | POA: Diagnosis not present

## 2019-01-03 DIAGNOSIS — Z20828 Contact with and (suspected) exposure to other viral communicable diseases: Secondary | ICD-10-CM | POA: Insufficient documentation

## 2019-01-03 LAB — LIPID PANEL WITH LDL/HDL RATIO
Cholesterol, Total: 143 mg/dL (ref 100–199)
HDL: 39 mg/dL — ABNORMAL LOW (ref >39)
LDL Chol Calc (NIH): 81 mg/dL (ref 0–99)
LDL/HDL Ratio: 2.1 ratio (ref 0.0–3.6)
Triglycerides: 128 mg/dL (ref 0–149)
VLDL Cholesterol Cal: 23 mg/dL (ref 5–40)

## 2019-01-03 LAB — GLUCOSE, RANDOM: Glucose: 72 mg/dL (ref 65–99)

## 2019-01-03 NOTE — Progress Notes (Signed)
Patient was called 01/03/2019 and informed of lab results with education given on HDL and also follow up with primary care as needed.

## 2019-01-03 NOTE — Progress Notes (Signed)
Patient's labs glucose was 72 fasting.  HDL cholesterol is 39 normal is over 39.  May increase foods which increase HDL cholesterol.  All other cholesterol levels are within normal limits. Follow-up with primary care provider as needed.

## 2019-01-04 LAB — SARS CORONAVIRUS 2 (TAT 6-24 HRS): SARS Coronavirus 2: NEGATIVE

## 2019-01-07 ENCOUNTER — Encounter
Admission: RE | Disposition: A | Discharge status: Home / Self Care | Payer: Self-pay | Source: Home / Self Care | Attending: Gastroenterology

## 2019-01-07 ENCOUNTER — Ambulatory Visit
Admission: RE | Admit: 2019-01-07 | Discharge: 2019-01-07 | Disposition: A | Discharge status: Home / Self Care | Payer: Managed Care, Other (non HMO) | Attending: Gastroenterology | Admitting: Gastroenterology

## 2019-01-07 ENCOUNTER — Other Ambulatory Visit: Payer: Self-pay

## 2019-01-07 ENCOUNTER — Ambulatory Visit: Payer: Managed Care, Other (non HMO) | Admitting: Anesthesiology

## 2019-01-07 ENCOUNTER — Encounter: Payer: Self-pay | Admitting: Anesthesiology

## 2019-01-07 DIAGNOSIS — E559 Vitamin D deficiency, unspecified: Secondary | ICD-10-CM | POA: Diagnosis not present

## 2019-01-07 DIAGNOSIS — Z86010 Personal history of colonic polyps: Secondary | ICD-10-CM | POA: Insufficient documentation

## 2019-01-07 DIAGNOSIS — I2510 Atherosclerotic heart disease of native coronary artery without angina pectoris: Secondary | ICD-10-CM | POA: Diagnosis not present

## 2019-01-07 DIAGNOSIS — E7800 Pure hypercholesterolemia, unspecified: Secondary | ICD-10-CM | POA: Insufficient documentation

## 2019-01-07 DIAGNOSIS — K5730 Diverticulosis of large intestine without perforation or abscess without bleeding: Secondary | ICD-10-CM | POA: Diagnosis not present

## 2019-01-07 DIAGNOSIS — D123 Benign neoplasm of transverse colon: Secondary | ICD-10-CM | POA: Diagnosis not present

## 2019-01-07 DIAGNOSIS — Z09 Encounter for follow-up examination after completed treatment for conditions other than malignant neoplasm: Secondary | ICD-10-CM | POA: Diagnosis not present

## 2019-01-07 DIAGNOSIS — E785 Hyperlipidemia, unspecified: Secondary | ICD-10-CM | POA: Diagnosis not present

## 2019-01-07 DIAGNOSIS — Z89421 Acquired absence of other right toe(s): Secondary | ICD-10-CM | POA: Insufficient documentation

## 2019-01-07 DIAGNOSIS — E781 Pure hyperglyceridemia: Secondary | ICD-10-CM | POA: Diagnosis not present

## 2019-01-07 DIAGNOSIS — K640 First degree hemorrhoids: Secondary | ICD-10-CM | POA: Insufficient documentation

## 2019-01-07 DIAGNOSIS — Z794 Long term (current) use of insulin: Secondary | ICD-10-CM | POA: Insufficient documentation

## 2019-01-07 DIAGNOSIS — Z79899 Other long term (current) drug therapy: Secondary | ICD-10-CM | POA: Diagnosis not present

## 2019-01-07 DIAGNOSIS — Z7982 Long term (current) use of aspirin: Secondary | ICD-10-CM | POA: Insufficient documentation

## 2019-01-07 DIAGNOSIS — E1142 Type 2 diabetes mellitus with diabetic polyneuropathy: Secondary | ICD-10-CM | POA: Insufficient documentation

## 2019-01-07 DIAGNOSIS — I10 Essential (primary) hypertension: Secondary | ICD-10-CM | POA: Diagnosis not present

## 2019-01-07 HISTORY — DX: Diverticulosis of intestine, part unspecified, without perforation or abscess without bleeding: K57.90

## 2019-01-07 HISTORY — DX: Benign neoplasm, unspecified site: D36.9

## 2019-01-07 HISTORY — PX: COLONOSCOPY WITH PROPOFOL: SHX5780

## 2019-01-07 HISTORY — DX: Polyneuropathy, unspecified: G62.9

## 2019-01-07 HISTORY — DX: Bladder-neck obstruction: N32.0

## 2019-01-07 LAB — GLUCOSE, CAPILLARY: Glucose-Capillary: 140 mg/dL — ABNORMAL HIGH (ref 70–99)

## 2019-01-07 SURGERY — COLONOSCOPY WITH PROPOFOL
Anesthesia: General

## 2019-01-07 MED ORDER — PROPOFOL 10 MG/ML IV BOLUS
INTRAVENOUS | Status: DC | PRN
  Administered 2019-01-07 (×3): 20 mg via INTRAVENOUS
  Administered 2019-01-07: 30 mg via INTRAVENOUS
  Administered 2019-01-07: 50 mg via INTRAVENOUS

## 2019-01-07 MED ORDER — LIDOCAINE HCL (CARDIAC) PF 100 MG/5ML IV SOSY
PREFILLED_SYRINGE | INTRAVENOUS | Status: DC | PRN
  Administered 2019-01-07: 60 mg via INTRATRACHEAL

## 2019-01-07 MED ORDER — PROPOFOL 500 MG/50ML IV EMUL
INTRAVENOUS | Status: DC | PRN
  Administered 2019-01-07: 80 ug/kg/min via INTRAVENOUS

## 2019-01-07 MED ORDER — PROPOFOL 10 MG/ML IV BOLUS
INTRAVENOUS | Status: AC
  Filled 2019-01-07: qty 40

## 2019-01-07 MED ORDER — PROPOFOL 10 MG/ML IV BOLUS
INTRAVENOUS | Status: AC
  Filled 2019-01-07: qty 20

## 2019-01-07 MED ORDER — SODIUM CHLORIDE 0.9 % IV SOLN
INTRAVENOUS | Status: DC
  Administered 2019-01-07: 08:00:00 via INTRAVENOUS

## 2019-01-07 MED ORDER — LIDOCAINE HCL (PF) 2 % IJ SOLN
INTRAMUSCULAR | Status: AC
  Filled 2019-01-07: qty 10

## 2019-01-07 NOTE — Anesthesia Postprocedure Evaluation (Signed)
Anesthesia Post Note  [name]  Procedure(s) Performed: COLONOSCOPY WITH PROPOFOL (N/A )  Patient location during evaluation: Endoscopy Anesthesia Type: General Level of consciousness: awake and alert and oriented Pain management: pain level controlled Vital Signs Assessment: post-procedure vital signs reviewed and stable Respiratory status: spontaneous breathing Cardiovascular status: blood pressure returned to baseline Anesthetic complications: no     Last Vitals:  Vitals:   01/07/19 0849 01/07/19 0859  BP: 132/66 129/66  Pulse: 66 (!) 55  Resp: 17 15  Temp:    SpO2: 99% 100%    Last Pain:  Vitals:   01/07/19 0859  TempSrc:   PainSc: 0-No pain                 [name]

## 2019-01-07 NOTE — Op Note (Addendum)
[facility] Gastroenterology Patient Name: Andrew Harding Procedure Date: 01/07/2019 8:01 AM MRN: [id number] Account #: [id number] Date of Birth: [birth year] Admit Type: Outpatient Age: 68 Room: [facility] ENDO ROOM 2 Gender: Male Note Status: Finalized Procedure:            Colonoscopy Indications:          Personal history of colonic polyps Providers:            Lollie Sails, MD Referring MD:         Ramonita Lab, MD (Referring MD) Medicines:            Monitored Anesthesia Care Complications:        No immediate complications. Procedure:            Pre-Anesthesia Assessment:                       - ASA Grade Assessment: III - A patient with severe                        systemic disease.                       After obtaining informed consent, the colonoscope was                        passed under direct vision. Throughout the procedure,                        the patient's blood pressure, pulse, and oxygen                        saturations were monitored continuously. The was                        introduced through the anus and advanced to the the                        cecum, identified by appendiceal orifice and ileocecal                        valve. The colonoscopy was performed without                        difficulty. The patient tolerated the procedure well.                        The quality of the bowel preparation was good. Findings:      Multiple small-mouthed diverticula were found in the descending colon       and transverse colon.      A 1 mm polyp was found in the transverse colon. The polyp was sessile.       The polyp was removed with a cold biopsy forceps. Resection and       retrieval were complete.      Non-bleeding internal hemorrhoids were found during retroflexion and       during anoscopy. The hemorrhoids were small and Grade I (internal       hemorrhoids that do not prolapse).      The exam was otherwise normal throughout the examined  colon.      No additional abnormalities were found on retroflexion.  The digital rectal exam was normal.      A tattoo was seen in the distal sigmoid colon, rectosigmoid junction, no       evidence of polyp recurrance. Impression:           - Diverticulosis in the descending colon and in the                        transverse colon.                       - One 1 mm polyp in the transverse colon, removed with                        a cold biopsy forceps. Resected and retrieved.                       - Non-bleeding internal hemorrhoids. Recommendation:       - Discharge patient to home.                       - Telephone GI clinic for pathology results in 1 week. Procedure Code(s):    --- Professional ---                       [phone], Colonoscopy, flexible; with biopsy, single or                        multiple Diagnosis Code(s):    --- Professional ---                       K64.0, First degree hemorrhoids                       K63.5, Polyp of colon                       Z86.010, Personal history of colonic polyps                       K57.30, Diverticulosis of large intestine without                        perforation or abscess without bleeding CPT copyright 2019 American Medical Association. All rights reserved. The codes documented in this report are preliminary and upon coder review may  be revised to meet current compliance requirements. Lollie Sails, MD 01/07/2019 8:36:01 AM This report has been signed electronically. Number of Addenda: 0 Note Initiated On: 01/07/2019 8:01 AM Scope Withdrawal Time: 0 hours 9 minutes 0 seconds  Total Procedure Duration: 0 hours 22 minutes 47 seconds       [facility]

## 2019-01-07 NOTE — Anesthesia Preprocedure Evaluation (Signed)
Anesthesia Evaluation  Patient identified by MRN, date of birth, ID band Patient awake    Reviewed: Allergy & Precautions, H&P , NPO status , Patient's Chart, lab work & pertinent test results  History of Anesthesia Complications Negative for: history of anesthetic complications  Airway Mallampati: III  TM Distance: >3 FB Neck ROM: limited    Dental  (+) Poor Dentition   Pulmonary neg pulmonary ROS, neg shortness of breath,    Pulmonary exam normal breath sounds clear to auscultation       Cardiovascular Exercise Tolerance: Good hypertension, (-) angina+ CAD and + CABG  (-) DOE Normal cardiovascular exam+ Valvular Problems/Murmurs  Rhythm:regular Rate:Normal     Neuro/Psych negative neurological ROS  negative psych ROS   GI/Hepatic negative GI ROS, Neg liver ROS,   Endo/Other  diabetes, Type 2, Insulin Dependent  Renal/GU negative Renal ROS  negative genitourinary   Musculoskeletal   Abdominal   Peds  Hematology negative hematology ROS (+)   Anesthesia Other Findings Past Medical History: No date: Coronary artery disease No date: Diabetes mellitus without complication (HCC) No date: Diabetic neuropathy (HCC) No date: Gout No date: Hyperlipidemia No date: Hypertension No date: Vitamin D deficiency  Past Surgical History: 06/01/2015: AMPUTATION TOE Right     Comment: Procedure: AMPUTATION TOE;  Surgeon: Albertine Patricia, DPM;  Location: [facility] ORS;  Service:               Podiatry;  Laterality: Right; No date: CATARACT EXTRACTION, BILATERAL No date: CORONARY ARTERY BYPASS GRAFT 06/01/2015: EXCISION PARTIAL PHALANX Right     Comment: Procedure: EXCISION PARTIAL PHALANX /  BONE;                Surgeon: Albertine Patricia, DPM;  Location: [facility]               ORS;  Service: Podiatry;  Laterality: Right; No date: KNEE ARTHROSCOPY     Comment: Left knee   BMI    Body Mass Index:  33.62 kg/m      Reproductive/Obstetrics negative OB ROS                             Anesthesia Physical  Anesthesia Plan  ASA: III  Anesthesia Plan: General   Post-op Pain Management:    Induction:   PONV Risk Score and Plan:   Airway Management Planned:   Additional Equipment:   Intra-op Plan:   Post-operative Plan:   Informed Consent: I have reviewed the patients History and Physical, chart, labs and discussed the procedure including the risks, benefits and alternatives for the proposed anesthesia with the patient or authorized representative who has indicated his/her understanding and acceptance.     Dental Advisory Given  Plan Discussed with: Anesthesiologist, CRNA and Surgeon  Anesthesia Plan Comments:         Anesthesia Quick Evaluation

## 2019-01-07 NOTE — Transfer of Care (Signed)
Immediate Anesthesia Transfer of Care Note  [name]  Procedure(s) Performed: COLONOSCOPY WITH PROPOFOL (N/A )  Patient Location: PACU and Endoscopy Unit  Anesthesia Type:General  Level of Consciousness: drowsy  Airway & Oxygen Therapy: Patient Spontanous Breathing and Patient connected to nasal cannula oxygen  Post-op Assessment: Report given to RN and Post -op Vital signs reviewed and stable  Post vital signs: stable  Last Vitals:  Vitals Value Taken Time  BP 119/60 01/07/19 0841  Temp    Pulse 67 01/07/19 0841  Resp 12 01/07/19 0841  SpO2 98 % 01/07/19 0841    Last Pain:  Vitals:   01/07/19 0748  TempSrc: Oral         Complications: No apparent anesthesia complications

## 2019-01-07 NOTE — Anesthesia Post-op Follow-up Note (Signed)
Anesthesia QCDR form completed.        

## 2019-01-07 NOTE — H&P (Signed)
Outpatient short stay form Pre-procedure 01/07/2019 7:47 AM Andrew Sails MD  Primary Physician: Dr. Ramonita Lab  Reason for visit: Colonoscopy  History of present illness: Patient is a 68 year old male presenting today for colonoscopy in regards to his personal history of adenomatous colon polyps.  His last colonoscopy was 02/14/2016.  Previous to that he had had a large polyp, adenoma, removed from the upper rectum/distal sigmoid region.  Patient tolerated his prep well.  He takes a daily 81 mg aspirin.  He denies use of any other aspirin product or blood thinning agent.  He has no problems with abdominal pain rectal bleeding or diarrhea.    Current Facility-Administered Medications:  .  0.9 %  sodium chloride infusion, , Intravenous, Continuous, Andrew Sails, MD  Medications Prior to Admission  Medication Sig Dispense Refill Last Dose  . aspirin EC 81 MG tablet Take 81 mg by mouth daily.   01/06/2019 at Unknown time  . finasteride (PROSCAR) 5 MG tablet Take 5 mg by mouth daily.     . Insulin Glargine (BASAGLAR KWIKPEN) 100 UNIT/ML SOPN    01/06/2019 at Unknown time  . insulin glargine (LANTUS) 100 UNIT/ML injection Inject 52 Units into the skin at bedtime.   01/06/2019 at Unknown time  . Insulin Lispro (HUMALOG KWIKPEN) 200 UNIT/ML SOPN INJECT 15-20 UNITS SQ THREE TIMES A DAY   01/06/2019 at Unknown time  . Insulin Pen Needle (FIFTY50 PEN NEEDLES) 32G X 4 MM MISC USE 4 TIMES DAILY   01/06/2019 at Unknown time  . latanoprost (XALATAN) 0.005 % ophthalmic solution    01/06/2019 at Unknown time  . lisinopril (ZESTRIL) 5 MG tablet TAKE ONE TABLET BY MOUTH EVERY DAY FOR BLOOD PRESSURE   01/06/2019 at Unknown time  . metFORMIN (GLUCOPHAGE-XR) 500 MG 24 hr tablet    01/06/2019 at Unknown time  . pravastatin (PRAVACHOL) 80 MG tablet TAKE ONE TABLET BY MOUTH AT NIGHT   01/06/2019 at Unknown time  . SitaGLIPtin-MetFORMIN HCl (JANUMET XR) 50-1000 MG TB24 TAKE 2 TABLETS BY MOUTH DAILY   01/06/2019 at Unknown  time  . traZODone (DESYREL) 50 MG tablet TAKE ONE TABLET AT BEDTIME AS NEEDED   01/06/2019 at Unknown time  . TRULICITY 1.5 BW/6.2MB SOPN    01/06/2019 at Unknown time  . Vitamin D, Cholecalciferol, 50 MCG (2000 UT) CAPS Take by mouth.   01/06/2019 at Unknown time  . Blood Glucose Monitoring Suppl (FIFTY50 GLUCOSE METER 2.0) w/Device KIT Use as directed     . cholecalciferol (VITAMIN D) 1000 units tablet Take 2,000 Units by mouth daily.     Marland Kitchen gabapentin (NEURONTIN) 300 MG capsule Take 300 mg by mouth 3 (three) times daily.     Marland Kitchen GAVILYTE-N WITH FLAVOR PACK 420 g solution      . glucose blood (ONETOUCH ULTRA) test strip Use 3 (three) times daily        No Known Allergies   Past Medical History:  Diagnosis Date  . Bladder neck obstruction   . Coronary artery disease   . Diabetes mellitus without complication ([place])   . Diabetic neuropathy ([place])   . Diabetic peripheral neuropathy ([place])   . Diverticulosis   . Gout   . Heart murmur   . Hypercholesteremia   . Hyperlipidemia   . Hypertension   . Hypertriglyceridemia   . Peripheral neuropathy   . Tubular adenoma   . Vitamin D deficiency   . Vitamin D deficiency     Review of systems:  Physical Exam    Heart and lungs: Regular rate and rhythm without rub or gallop lungs are bilaterally clear    HEENT: Normocephalic atraumatic eyes are anicteric    Other:    Pertinant exam for procedure: Soft nontender nondistended bowel sounds positive normoactive    Planned proceedures: Colonoscopy and indicated procedures. I have discussed the risks benefits and complications of procedures to include not limited to bleeding, infection, perforation and the risk of sedation and the patient wishes to proceed.    Andrew Sails, MD Gastroenterology 01/07/2019  7:47 AM

## 2019-01-08 ENCOUNTER — Encounter: Payer: Self-pay | Admitting: Gastroenterology

## 2019-01-08 LAB — SURGICAL PATHOLOGY

## 2019-08-28 ENCOUNTER — Other Ambulatory Visit: Payer: Self-pay

## 2019-08-28 ENCOUNTER — Encounter: Payer: Self-pay | Admitting: Physician Assistant

## 2019-08-28 ENCOUNTER — Ambulatory Visit: Payer: Managed Care, Other (non HMO) | Admitting: Physician Assistant

## 2019-08-28 VITALS — BP 130/66 | HR 72 | Temp 97.0°F | Resp 18 | Ht 75.0 in | Wt 255.0 lb

## 2019-08-28 DIAGNOSIS — Z008 Encounter for other general examination: Secondary | ICD-10-CM

## 2019-08-28 NOTE — Progress Notes (Signed)
Patient ID: Deforest H Pinch, male   DOB: 01/26/1951, 69 y.o.   MRN: [id number]  Chief Complaint  Patient presents with  . Biometrics    HPI Renny H Drudge is a 69 y.o. male.who is employed with [employer]. He is here today for a biometric physical  Klein, Bert J III, MD is his primary MD He occasionally smokes a cigar. Pt recently saw his endocrinologist for diabetes management.  He is not having any acute problems.  HPI   Past Medical History:  Diagnosis Date  . Bladder neck obstruction   . Coronary artery disease   . Diabetes mellitus without complication (HCC)   . Diabetic neuropathy (HCC)   . Diabetic peripheral neuropathy (HCC)   . Diverticulosis   . Gout   . Heart murmur   . Hypercholesteremia   . Hyperlipidemia   . Hypertension   . Hypertriglyceridemia   . Peripheral neuropathy   . Tubular adenoma   . Vitamin D deficiency   . Vitamin D deficiency     Past Surgical History:  Procedure Laterality Date  . AMPUTATION TOE Right 06/01/2015   Procedure: AMPUTATION TOE;  Surgeon: Matthew Troxler, DPM;  Location: [facility] ORS;  Service: Podiatry;  Laterality: Right;  . CATARACT EXTRACTION, BILATERAL    . COLONOSCOPY WITH PROPOFOL N/A 02/14/2016   Procedure: COLONOSCOPY WITH PROPOFOL;  Surgeon: Martin U Skulskie, MD;  Location: [facility] ENDOSCOPY;  Service: Endoscopy;  Laterality: N/A;  . COLONOSCOPY WITH PROPOFOL N/A 01/07/2019   Procedure: COLONOSCOPY WITH PROPOFOL;  Surgeon: Skulskie, Martin U, MD;  Location: [facility] ENDOSCOPY;  Service: Endoscopy;  Laterality: N/A;  . CORONARY ARTERY BYPASS GRAFT     4 VESSELS  . EXCISION PARTIAL PHALANX Right 06/01/2015   Procedure: EXCISION PARTIAL PHALANX /  BONE;  Surgeon: Matthew Troxler, DPM;  Location: [facility] ORS;  Service: Podiatry;  Laterality: Right;  . FLEXIBLE SIGMOIDOSCOPY N/A 05/29/2016   Procedure: FLEXIBLE SIGMOIDOSCOPY;  Surgeon: Martin U Skulskie, MD;  Location: [facility] ENDOSCOPY;  Service: Endoscopy;  Laterality: N/A;  .  KNEE ARTHROSCOPY     Left knee     Family History  Problem Relation Age of Onset  . Diabetes Mellitus II Mother   . CAD Mother     Social History Social History   Tobacco Use  . Smoking status: Never Smoker  . Smokeless tobacco: Never Used  Substance Use Topics  . Alcohol use: No  . Drug use: No    No Known Allergies  Current Outpatient Medications  Medication Sig Dispense Refill  . aspirin EC 81 MG tablet Take 81 mg by mouth daily.    . Blood Glucose Monitoring Suppl (FIFTY50 GLUCOSE METER 2.0) w/Device KIT Use as directed    . cholecalciferol (VITAMIN D) 1000 units tablet Take 2,000 Units by mouth daily.    . finasteride (PROSCAR) 5 MG tablet Take 5 mg by mouth daily.    . gabapentin (NEURONTIN) 300 MG capsule Take 300 mg by mouth 3 (three) times daily.    . GAVILYTE-N WITH FLAVOR PACK 420 g solution     . glucose blood (ONETOUCH ULTRA) test strip Use 3 (three) times daily    . Insulin Glargine (BASAGLAR KWIKPEN) 100 UNIT/ML SOPN     . insulin glargine (LANTUS) 100 UNIT/ML injection Inject 52 Units into the skin at bedtime.    . Insulin Lispro (HUMALOG KWIKPEN) 200 UNIT/ML SOPN INJECT 15-20 UNITS SQ THREE TIMES A DAY    . Insulin Pen Needle (FIFTY50 PEN   NEEDLES) 32G X 4 MM MISC USE 4 TIMES DAILY    . latanoprost (XALATAN) 0.005 % ophthalmic solution     . lisinopril (ZESTRIL) 5 MG tablet TAKE ONE TABLET BY MOUTH EVERY DAY FOR BLOOD PRESSURE    . metFORMIN (GLUCOPHAGE-XR) 500 MG 24 hr tablet     . pravastatin (PRAVACHOL) 80 MG tablet TAKE ONE TABLET BY MOUTH AT NIGHT    . SitaGLIPtin-MetFORMIN HCl (JANUMET XR) 50-1000 MG TB24 TAKE 2 TABLETS BY MOUTH DAILY    . traZODone (DESYREL) 50 MG tablet TAKE ONE TABLET AT BEDTIME AS NEEDED    . TRULICITY 1.5 MG/0.5ML SOPN     . Vitamin D, Cholecalciferol, 50 MCG (2000 UT) CAPS Take by mouth.     No current facility-administered medications for this visit.    Review of Systems Review of Systems  Constitutional: Negative for  unexpected weight change.  HENT: Negative for dental problem.   Eyes: Negative for visual disturbance.  Respiratory: Negative for chest tightness.   Cardiovascular: Negative for leg swelling.  Musculoskeletal: Negative for back pain.  All other systems reviewed and are negative.   Blood pressure 130/66, pulse 72, temperature (!) 97 F (36.1 C), temperature source Temporal, resp. rate 18, height 6' 3" (1.905 m), weight 255 lb (115.7 kg), SpO2 97 %.  Physical Exam Physical Exam Vitals and nursing note reviewed.  Constitutional:      Appearance: Normal appearance. He is well-developed.  HENT:     Head: Normocephalic.     Right Ear: Tympanic membrane normal.     Left Ear: Tympanic membrane normal.     Nose: Nose normal.     Mouth/Throat:     Mouth: Mucous membranes are moist.     Pharynx: Oropharynx is clear.  Eyes:     Pupils: Pupils are equal, round, and reactive to light.  Cardiovascular:     Rate and Rhythm: Normal rate and regular rhythm.  Pulmonary:     Effort: Pulmonary effort is normal.  Abdominal:     General: Abdomen is flat. There is no distension.  Musculoskeletal:        General: Normal range of motion.     Cervical back: Normal range of motion and neck supple.  Skin:    General: Skin is warm and dry.  Neurological:     General: No focal deficit present.     Mental Status: He is alert and oriented to person, place, and time.  Psychiatric:        Mood and Affect: Mood normal.     Data Reviewed   Assessment Biometric Physical   Plan Labs obtained and will be reviewed Return in 1 year for recheck     Karen Sofia 08/28/2019, 8:58 AM   

## 2019-08-29 LAB — LIPID PANEL
Chol/HDL Ratio: 4.3 ratio (ref 0.0–5.0)
Cholesterol, Total: 149 mg/dL (ref 100–199)
HDL: 35 mg/dL — ABNORMAL LOW (ref >39)
LDL Chol Calc (NIH): 86 mg/dL (ref 0–99)
Triglycerides: 160 mg/dL — ABNORMAL HIGH (ref 0–149)
VLDL Cholesterol Cal: 28 mg/dL (ref 5–40)

## 2019-08-29 LAB — GLUCOSE, RANDOM: Glucose: 64 mg/dL — ABNORMAL LOW (ref 65–99)

## 2020-08-24 ENCOUNTER — Encounter: Payer: Managed Care, Other (non HMO) | Admitting: Physician Assistant

## 2020-08-26 ENCOUNTER — Ambulatory Visit: Payer: Managed Care, Other (non HMO) | Admitting: Physician Assistant

## 2020-08-26 ENCOUNTER — Encounter: Payer: Self-pay | Admitting: Physician Assistant

## 2020-08-26 VITALS — BP 130/84 | HR 60 | Temp 98.7°F | Resp 16 | Ht 75.0 in | Wt 246.0 lb

## 2020-08-26 DIAGNOSIS — Z0000 Encounter for general adult medical examination without abnormal findings: Secondary | ICD-10-CM

## 2020-08-26 DIAGNOSIS — Z008 Encounter for other general examination: Secondary | ICD-10-CM
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
## 2020-08-27 LAB — LIPID PANEL
Chol/HDL Ratio: 4.3 ratio (ref 0.0–5.0)
Cholesterol, Total: 152 mg/dL (ref 100–199)
HDL: 35 mg/dL — ABNORMAL LOW (ref >39)
LDL Chol Calc (NIH): 91 mg/dL (ref 0–99)
Triglycerides: 150 mg/dL — ABNORMAL HIGH (ref 0–149)
VLDL Cholesterol Cal: 26 mg/dL (ref 5–40)

## 2020-08-27 LAB — GLUCOSE, RANDOM: Glucose: 170 mg/dL — ABNORMAL HIGH (ref 65–99)

## 2022-05-30 ENCOUNTER — Ambulatory Visit (INDEPENDENT_AMBULATORY_CARE_PROVIDER_SITE_OTHER): Payer: Managed Care, Other (non HMO) | Admitting: Urology

## 2022-05-30 ENCOUNTER — Other Ambulatory Visit: Payer: Self-pay | Admitting: Urology

## 2022-05-30 ENCOUNTER — Telehealth: Payer: Self-pay

## 2022-05-30 VITALS — BP 114/75 | HR 82 | Wt 244.2 lb

## 2022-05-30 DIAGNOSIS — N476 Balanoposthitis: Secondary | ICD-10-CM

## 2022-05-30 DIAGNOSIS — Z794 Long term (current) use of insulin: Secondary | ICD-10-CM | POA: Diagnosis not present

## 2022-05-30 DIAGNOSIS — N471 Phimosis: Secondary | ICD-10-CM

## 2022-05-30 DIAGNOSIS — E1169 Type 2 diabetes mellitus with other specified complication: Secondary | ICD-10-CM | POA: Diagnosis not present

## 2022-05-30 LAB — URINALYSIS, COMPLETE
Bilirubin, UA: NEGATIVE
Glucose, UA: NEGATIVE
Ketones, UA: NEGATIVE
Leukocytes,UA: NEGATIVE
Nitrite, UA: NEGATIVE
Specific Gravity, UA: 1.025 (ref 1.005–1.030)
Urobilinogen, Ur: 0.2 mg/dL (ref 0.2–1.0)
pH, UA: 5 (ref 5.0–7.5)

## 2022-05-30 LAB — MICROSCOPIC EXAMINATION

## 2022-05-30 NOTE — Progress Notes (Signed)
Surgical Physician Order Form [facility]   * Scheduling expectation : Next Available  *Length of Case:   *Clearance needed: no  *Anticoagulation Instructions:  hold  *Aspirin Instructions: Hold Aspirin 5 days prior to procedure  *Post-op visit Date/Instructions:1 month wound check  *Diagnosis: phimosis/balanoposthitis   *Procedure:  circumcision   Additional orders: Botox Injection 200 units  -Admit type: OUTpatient  -Anesthesia: General  -VTE Prophylaxis Standing Order SCD's       Other:   -Standing Lab Orders Per Anesthesia    Lab other: None  -Standing Test orders EKG/Chest x-ray per Anesthesia       Test other:   - Medications:  Ancef 2gm IV  -Other orders:  N/A

## 2022-05-30 NOTE — Progress Notes (Signed)
   [facility]  Surgery Date: Date: 06/12/2022  Surgeon: Dr. 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 Patient will also hold Trulicity for 1 week prior to surgery  Cardiac/Medical/Pulmonary Clearance needed: no  *Orders entered into EPIC  Date: 05/30/22   *Case booked in EPIC  Date: 05/30/22  *Notified pt of Surgery: Date: 05/30/22  PRE-OP UA & CX: no  *Placed into Prior Authorization Work Que Date: 05/30/22  Assistant/laser/rep:No

## 2022-05-30 NOTE — Telephone Encounter (Signed)
I spoke with Andrew Harding while in clinic. We have discussed possible surgery dates and Monday February 12th, 2024 was agreed upon by all parties. Patient given information about surgery date, what to expect pre-operatively and post operatively.  We discussed that a Pre-Admission Testing office will be calling to set up the pre-op visit that will take place prior to surgery, and that these appointments are typically done over the phone with a Pre-Admissions RN. Informed patient that our office will communicate any additional care to be provided after surgery. Patients questions or concerns were discussed during our call. Advised to call our office should there be any additional information, questions or concerns that arise. Patient verbalized understanding.

## 2022-05-30 NOTE — H&P (View-Only) (Signed)
I, [name],acting as a scribe for Andrew Espy, MD.,have documented all relevant documentation on the behalf of Andrew Espy, MD,as directed by  Andrew Espy, MD while in the presence of Andrew Espy, MD.   05/30/22 12:27 PM   Andrew Harding Jun 03, 1950 [id number]  Referring provider: Adin Hector, MD [facility] [place],  [place] [address]  Chief Complaint  Patient presents with   Establish Care   genital pain    HPI: 71 year-old male with a personal history of diabetes who presents today for further evaluation of penile issues.   He was recently seen evaluated by his PCP at early January for candidiasis of the penis. At that time he was known to be uncircumcised with erythema and glandular irritation. He was prescribed Diflucan for a week orally.    His most recent hemoglobin A1c was 7.4.  He notes that this issue has been ongoing for about 30 days. He was given Nystatin cream but has not noticed any improvement. He states that he is having to sit to urinate.   PMH: Past Medical History:  Diagnosis Date   Bladder neck obstruction    Coronary artery disease    Diabetes mellitus without complication (HCC)    Diabetic neuropathy ([place])    Diabetic peripheral neuropathy ([place])    Diverticulosis    Gout    Heart murmur    Hypercholesteremia    Hyperlipidemia    Hypertension    Hypertriglyceridemia    Peripheral neuropathy    Tubular adenoma    Vitamin D deficiency    Vitamin D deficiency     Surgical History: Past Surgical History:  Procedure Laterality Date   AMPUTATION TOE Right 06/01/2015   Procedure: AMPUTATION TOE;  Surgeon: Albertine Patricia, DPM;  Location: [facility] ORS;  Service: Podiatry;  Laterality: Right;   CATARACT EXTRACTION, BILATERAL     COLONOSCOPY WITH PROPOFOL N/A 02/14/2016   Procedure: COLONOSCOPY WITH PROPOFOL;  Surgeon: Lollie Sails, MD;  Location: [facility] ENDOSCOPY;  Service: Endoscopy;  Laterality: N/A;    COLONOSCOPY WITH PROPOFOL N/A 01/07/2019   Procedure: COLONOSCOPY WITH PROPOFOL;  Surgeon: Lollie Sails, MD;  Location: [facility] ENDOSCOPY;  Service: Endoscopy;  Laterality: N/A;   CORONARY ARTERY BYPASS GRAFT     4 VESSELS   EXCISION PARTIAL PHALANX Right 06/01/2015   Procedure: EXCISION PARTIAL PHALANX /  BONE;  Surgeon: Albertine Patricia, DPM;  Location: [facility] ORS;  Service: Podiatry;  Laterality: Right;   FLEXIBLE SIGMOIDOSCOPY N/A 05/29/2016   Procedure: FLEXIBLE SIGMOIDOSCOPY;  Surgeon: Lollie Sails, MD;  Location: [facility] ENDOSCOPY;  Service: Endoscopy;  Laterality: N/A;   KNEE ARTHROSCOPY     Left knee     Home Medications:  Allergies as of 05/30/2022   No Known Allergies      Medication List        Accurate as of May 30, 2022 12:27 PM. If you have any questions, ask your nurse or doctor.          aspirin EC 81 MG tablet Take 81 mg by mouth daily.   cholecalciferol 1000 units tablet Commonly known as: VITAMIN D Take 2,000 Units by mouth daily.   Fifty50 Pen Needles 32G X 4 MM Misc Generic drug: Insulin Pen Needle USE 4 TIMES DAILY   finasteride 5 MG tablet Commonly known as: PROSCAR Take 5 mg by mouth daily.   gabapentin 300 MG capsule Commonly known as: NEURONTIN Take 300 mg by  mouth 3 (three) times daily.   GaviLyte-N with Flavor Pack 420 g solution Generic drug: polyethylene glycol-electrolytes   HumaLOG KwikPen 200 UNIT/ML KwikPen Generic drug: insulin lispro INJECT 15-20 UNITS SQ THREE TIMES A DAY   insulin glargine 100 UNIT/ML injection Commonly known as: LANTUS Inject 52 Units into the skin at bedtime.   Basaglar KwikPen 100 UNIT/ML   Janumet XR 50-1000 MG Tb24 Generic drug: SitaGLIPtin-MetFORMIN HCl TAKE 2 TABLETS BY MOUTH DAILY   latanoprost 0.005 % ophthalmic solution Commonly known as: XALATAN   lisinopril 5 MG tablet Commonly known as: ZESTRIL TAKE ONE TABLET BY MOUTH EVERY DAY FOR BLOOD PRESSURE   metFORMIN 500 MG 24 hr  tablet Commonly known as: GLUCOPHAGE-XR   OneTouch Ultra test strip Generic drug: glucose blood Use 3 (three) times daily   pravastatin 80 MG tablet Commonly known as: PRAVACHOL TAKE ONE TABLET BY MOUTH AT NIGHT   traZODone 50 MG tablet Commonly known as: DESYREL TAKE ONE TABLET AT BEDTIME AS NEEDED   Trulicity 1.5 0000000 Sopn Generic drug: Dulaglutide        Family History: Family History  Problem Relation Age of Onset   Diabetes Mellitus II Mother    CAD Mother     Social History:  reports that he has never smoked. He has never used smokeless tobacco. He reports that he does not drink alcohol and does not use drugs.   Physical Exam: BP 114/75   Pulse 82   Wt 244 lb 4 oz (110.8 kg)   BMI 30.53 kg/m   Constitutional:  Alert and oriented, No acute distress. HEENT: [place] AT, moist mucus membranes.  Trachea midline, no masses. Penis: uncircumcised phallus, with significant phimosis, unable to retract foreskin.  Foreskin is inflamed, red with erosions and multiple areas. Neurologic: Grossly intact, no focal deficits, moving all 4 extremities. Psychiatric: Normal mood and affect.  Urinalysis    Component Value Date/Time   APPEARANCEUR Hazy (A) 05/30/2022 1026   GLUCOSEU Negative 05/30/2022 1026   BILIRUBINUR Negative 05/30/2022 1026   PROTEINUR 1+ (A) 05/30/2022 1026   UROBILINOGEN 0.2 04/05/2018 1042   NITRITE Negative 05/30/2022 1026   LEUKOCYTESUR Negative 05/30/2022 1026    Lab Results  Component Value Date   LABMICR See below: 05/30/2022   WBCUA 0-5 05/30/2022   LABEPIT 0-10 05/30/2022   BACTERIA Few 05/30/2022    Assessment & Plan:    Balanoposthitis/severe phimosis - We discussed circumcision in detail versus optimization of topical treatment in the form of medical appointments including [place] topical steroid -Risk factors include longstanding diabetes which is a contributing factor -He is more interested in circumcision.  We discussed the risk  including risk of bleeding, demonstrating structures, dissatisfaction with cosmesis as well as penile sensitivity amongst others.  We discussed the procedure to be done in the operating room along with local anesthetic.  We discussed wound care and postoperative care. -Prefer that he hold his aspirin for 5 days prior to the procedure given that this is for prophylaxis.  Also need to stop his GLP-1 medication we can advance.  Return for schedule circumcision.   [facility] [address], [place], [place] [address] [phone]

## 2022-05-30 NOTE — Progress Notes (Addendum)
 I, Andrew Harding,acting as a scribe for Andrew Mcfarlane, MD.,have documented all relevant documentation on the behalf of Andrew Demarest, MD,as directed by  Andrew Manseau, MD while in the presence of Andrew Issa, MD.   05/30/22 12:27 PM   Andrew Harding 01/16/1951 [id number]  Referring provider: Klein, Andrew J III, MD [address] [facility] [place],  [place] [address]  Chief Complaint  Patient presents with   Establish Care   genital pain    HPI: 72 year-old male with a personal history of diabetes who presents today for further evaluation of penile issues.   He was recently seen evaluated by his PCP at early January for candidiasis of the penis. At that time he was known to be uncircumcised with erythema and glandular irritation. He was prescribed Diflucan for a week orally.    His most recent hemoglobin A1c was 7.4.  He notes that this issue has been ongoing for about 30 days. He was given Nystatin cream but has not noticed any improvement. He states that he is having to sit to urinate.   PMH: Past Medical History:  Diagnosis Date   Bladder neck obstruction    Coronary artery disease    Diabetes mellitus without complication (HCC)    Diabetic neuropathy (HCC)    Diabetic peripheral neuropathy (HCC)    Diverticulosis    Gout    Heart murmur    Hypercholesteremia    Hyperlipidemia    Hypertension    Hypertriglyceridemia    Peripheral neuropathy    Tubular adenoma    Vitamin D deficiency    Vitamin D deficiency     Surgical History: Past Surgical History:  Procedure Laterality Date   AMPUTATION TOE Right 06/01/2015   Procedure: AMPUTATION TOE;  Surgeon: Matthew Troxler, DPM;  Location: [facility] ORS;  Service: Podiatry;  Laterality: Right;   CATARACT EXTRACTION, BILATERAL     COLONOSCOPY WITH PROPOFOL N/A 02/14/2016   Procedure: COLONOSCOPY WITH PROPOFOL;  Surgeon: Martin U Skulskie, MD;  Location: [facility] ENDOSCOPY;  Service: Endoscopy;  Laterality: N/A;    COLONOSCOPY WITH PROPOFOL N/A 01/07/2019   Procedure: COLONOSCOPY WITH PROPOFOL;  Surgeon: Skulskie, Martin U, MD;  Location: [facility] ENDOSCOPY;  Service: Endoscopy;  Laterality: N/A;   CORONARY ARTERY BYPASS GRAFT     4 VESSELS   EXCISION PARTIAL PHALANX Right 06/01/2015   Procedure: EXCISION PARTIAL PHALANX /  BONE;  Surgeon: Matthew Troxler, DPM;  Location: [facility] ORS;  Service: Podiatry;  Laterality: Right;   FLEXIBLE SIGMOIDOSCOPY N/A 05/29/2016   Procedure: FLEXIBLE SIGMOIDOSCOPY;  Surgeon: Martin U Skulskie, MD;  Location: [facility] ENDOSCOPY;  Service: Endoscopy;  Laterality: N/A;   KNEE ARTHROSCOPY     Left knee     Home Medications:  Allergies as of 05/30/2022   No Known Allergies      Medication List        Accurate as of May 30, 2022 12:27 PM. If you have any questions, ask your nurse or doctor.          aspirin EC 81 MG tablet Take 81 mg by mouth daily.   cholecalciferol 1000 units tablet Commonly known as: VITAMIN D Take 2,000 Units by mouth daily.   Fifty50 Pen Needles 32G X 4 MM Misc Generic drug: Insulin Pen Needle USE 4 TIMES DAILY   finasteride 5 MG tablet Commonly known as: PROSCAR Take 5 mg by mouth daily.   gabapentin 300 MG capsule Commonly known as: NEURONTIN Take 300 mg by   mouth 3 (three) times daily.   GaviLyte-N with Flavor Pack 420 g solution Generic drug: polyethylene glycol-electrolytes   HumaLOG KwikPen 200 UNIT/ML KwikPen Generic drug: insulin lispro INJECT 15-20 UNITS SQ THREE TIMES A DAY   insulin glargine 100 UNIT/ML injection Commonly known as: LANTUS Inject 52 Units into the skin at bedtime.   Basaglar KwikPen 100 UNIT/ML   Janumet XR 50-1000 MG Tb24 Generic drug: SitaGLIPtin-MetFORMIN HCl TAKE 2 TABLETS BY MOUTH DAILY   latanoprost 0.005 % ophthalmic solution Commonly known as: XALATAN   lisinopril 5 MG tablet Commonly known as: ZESTRIL TAKE ONE TABLET BY MOUTH EVERY DAY FOR BLOOD PRESSURE   metFORMIN 500 MG 24 hr  tablet Commonly known as: GLUCOPHAGE-XR   OneTouch Ultra test strip Generic drug: glucose blood Use 3 (three) times daily   pravastatin 80 MG tablet Commonly known as: PRAVACHOL TAKE ONE TABLET BY MOUTH AT NIGHT   traZODone 50 MG tablet Commonly known as: DESYREL TAKE ONE TABLET AT BEDTIME AS NEEDED   Trulicity 1.5 MG/0.5ML Sopn Generic drug: Dulaglutide        Family History: Family History  Problem Relation Age of Onset   Diabetes Mellitus II Mother    CAD Mother     Social History:  reports that he has never smoked. He has never used smokeless tobacco. He reports that he does not drink alcohol and does not use drugs.   Physical Exam: BP 114/75   Pulse 82   Wt 244 lb 4 oz (110.8 kg)   BMI 30.53 kg/m   Constitutional:  Alert and oriented, No acute distress. HEENT: [place] AT, moist mucus membranes.  Trachea midline, no masses. Penis: uncircumcised phallus, with significant phimosis, unable to retract foreskin.  Foreskin is inflamed, red with erosions and multiple areas. Neurologic: Grossly intact, no focal deficits, moving all 4 extremities. Psychiatric: Normal mood and affect.  Urinalysis    Component Value Date/Time   APPEARANCEUR Hazy (A) 05/30/2022 1026   GLUCOSEU Negative 05/30/2022 1026   BILIRUBINUR Negative 05/30/2022 1026   PROTEINUR 1+ (A) 05/30/2022 1026   UROBILINOGEN 0.2 04/05/2018 1042   NITRITE Negative 05/30/2022 1026   LEUKOCYTESUR Negative 05/30/2022 1026    Lab Results  Component Value Date   LABMICR See below: 05/30/2022   WBCUA 0-5 05/30/2022   LABEPIT 0-10 05/30/2022   BACTERIA Few 05/30/2022    Assessment & Plan:    Balanoposthitis/severe phimosis - We discussed circumcision in detail versus optimization of topical treatment in the form of medical appointments including us topical steroid -Risk factors include longstanding diabetes which is a contributing factor -He is more interested in circumcision.  We discussed the risk  including risk of bleeding, demonstrating structures, dissatisfaction with cosmesis as well as penile sensitivity amongst others.  We discussed the procedure to be done in the operating room along with local anesthetic.  We discussed wound care and postoperative care. -Prefer that he hold his aspirin for 5 days prior to the procedure given that this is for prophylaxis.  Also need to stop his GLP-1 medication we can advance.  Return for schedule circumcision.   [facility] [address], [address] [place], [place] [address] [phone]   

## 2022-06-06 ENCOUNTER — Encounter
Admission: RE | Admit: 2022-06-06 | Discharge: 2022-06-06 | Disposition: A | Discharge status: Home / Self Care | Payer: Managed Care, Other (non HMO) | Source: Ambulatory Visit | Attending: Urology | Admitting: Urology

## 2022-06-06 ENCOUNTER — Other Ambulatory Visit: Payer: Self-pay

## 2022-06-06 DIAGNOSIS — Z01812 Encounter for preprocedural laboratory examination: Secondary | ICD-10-CM

## 2022-06-06 DIAGNOSIS — E1169 Type 2 diabetes mellitus with other specified complication: Secondary | ICD-10-CM

## 2022-06-06 NOTE — Patient Instructions (Signed)
Your procedure is scheduled on: 06/12/22 - Monday Report to the Registration Desk on the 1st floor of the [facility]. To find out your arrival time, please call [phone] between 1PM - 3PM on: 06/09/22 If your arrival time is 6:00 am, do not arrive before that time as the [facility] entrance doors do not open until 6:00 am.  REMEMBER: Instructions that are not followed completely may result in serious medical risk, up to and including death; or upon the discretion of your surgeon and anesthesiologist your surgery may need to be rescheduled.  Do not eat food or drink any liquids after midnight the night before surgery.  No gum chewing or hard candies.  One week prior to surgery: Stop Anti-inflammatories (NSAIDS) such as Advil, Aleve, Ibuprofen, Motrin, Naproxen, Naprosyn and Aspirin based products such as Excedrin, Goody's Powder, BC Powder.  Stop ANY OVER THE COUNTER supplements until after surgery  beginning 06/06/22 - VITAMIN D   You may however, continue to take Tylenol if needed for pain up until the day of surgery.  Continue taking all prescribed medications with the exception of the following:  HOLD ASPIRIN 5 days prior to your surgery beginning 06/07/22.  STOP TRULICITY 7 days prior to your surgery.  HOLD METFORMIN 2 days prior to your procedure beginning 06/10/22.   INJECT only 1/2 of your prescribed insulin glargine (LANTUS) on the night before your surgery.  HOLD Insulin Glargine [facility] ) on the morning of surgery, may resume with meals.  HOLD lisinopril (ZESTRIL) on the morning of surgery.  TAKE THESE MEDICATIONS THE MORNING OF SURGERY WITH A SIP OF WATER:  gabapentin (NEURONTIN)    No Alcohol for 24 hours before or after surgery.  No Smoking including e-cigarettes for 24 hours before surgery.  No chewable tobacco products for at least 6 hours before surgery.  No nicotine patches on the day of surgery.  Do not use any "recreational" drugs for  at least a week (preferably 2 weeks) before your surgery.  Please be advised that the combination of cocaine and anesthesia may have negative outcomes, up to and including death. If you test positive for cocaine, your surgery will be cancelled.  On the morning of surgery brush your teeth with toothpaste and water, you may rinse your mouth with mouthwash if you wish. Do not swallow any toothpaste or mouthwash.  Use CHG Soap or wipes as directed on instruction sheet.  Do not wear jewelry, make-up, hairpins, clips or nail polish.  Do not wear lotions, powders, or perfumes.   Do not shave body hair from the neck down 48 hours before surgery.  Contact lenses, hearing aids and dentures may not be worn into surgery.  Do not bring valuables to the hospital. [facility] is not responsible for any missing/lost belongings or valuables.   Total Shoulder Arthroplasty:  use Benzoyl Peroxide 5% Gel as directed on instruction sheet.  Fleets enema or bowel prep as directed.  Notify your doctor if there is any change in your medical condition (cold, fever, infection).  Wear comfortable clothing (specific to your surgery type) to the hospital.  After surgery, you can help prevent lung complications by doing breathing exercises.  Take deep breaths and cough every 1-2 hours. Your doctor may order a device called an Incentive Spirometer to help you take deep breaths. When coughing or sneezing, hold a pillow firmly against your incision with both hands. This is called "splinting." Doing this helps protect your incision. It also decreases  belly discomfort.  If you are being admitted to the hospital overnight, leave your suitcase in the car. After surgery it may be brought to your room.  In case of increased patient census, it may be necessary for you, the patient, to continue your postoperative care in the Same Day Surgery department.  If you are being discharged the day of surgery, you will not be  allowed to drive home. You will need a responsible individual to drive you home and stay with you for 24 hours after surgery.   If you are taking public transportation, you will need to have a responsible individual with you.  Please call the [facility] Dept. at [phone] if you have any questions about these instructions.  Surgery Visitation Policy:  Patients undergoing a surgery or procedure may have two family members or support persons with them as long as the person is not COVID-19 positive or experiencing its symptoms.   Inpatient Visitation:    Visiting hours are 7 a.m. to 8 p.m. Up to four visitors are allowed at one time in a patient room. The visitors may rotate out with other people during the day. One designated support person (adult) may remain overnight.  Due to an increase in RSV and influenza rates and associated hospitalizations, children ages 43 and under will not be able to visit patients in [facility]. Masks continue to be strongly recommended.

## 2022-06-08 ENCOUNTER — Encounter
Admission: RE | Admit: 2022-06-08 | Discharge: 2022-06-08 | Disposition: A | Discharge status: Home / Self Care | Payer: Managed Care, Other (non HMO) | Source: Ambulatory Visit | Attending: Urology | Admitting: Urology

## 2022-06-08 DIAGNOSIS — Z01810 Encounter for preprocedural cardiovascular examination: Secondary | ICD-10-CM | POA: Insufficient documentation

## 2022-06-08 DIAGNOSIS — Z01812 Encounter for preprocedural laboratory examination: Secondary | ICD-10-CM

## 2022-06-08 NOTE — Progress Notes (Signed)
  Perioperative Services Pre-Admission/Anesthesia Testing    Date: 06/08/22  Name: Andrew Harding MRN:   [id number]  Re: GLP-1 clearance and provider recommendations   Planned Surgical Procedure(s):    Case: [id number] Date/Time: 06/12/22 0730   Procedure: CIRCUMCISION ADULT   Anesthesia type: General   Pre-op diagnosis: Phimosis   Location: [facility] OR ROOM 09 / [place] ORS FOR ANESTHESIA GROUP   Surgeons: Hollice Espy, MD   Clinical Notes:  Patient is scheduled for the above procedure with the indicated provider/surgeon. In review of his medication reconciliation it was noted that patient is on a prescribed GLP-1 medication. Per guidelines issued by the American Society of Anesthesiologists (ASA), it is recommended that these medications be held for 7 days prior to the patient undergoing any type of elective surgical procedure. The patient is taking the following GLP-1 medication:  '[]'$  SEMAGLUTIDE   '[]'$  EXENATIDE  '[]'$  LIRAGLUTIDE   '[]'$  LIXISENATIDE  '[x]'$  DULAGLUTIDE     '[]'$  OTHER GLP-1 medication: _______________  Reached out to prescribing provider Andrew Carina, MD) to make them aware of the guidelines from anesthesia. Given that this patient takes the prescribed GLP-1 medication for his  diabetes diagnosis, rather than for weight loss, recommendations from the prescribing provider were solicited. Prescribing provider made aware of the following so that informed decision/POC can be developed for this patient that may be taking medications belonging to these drug classes:  Oral GLP-1 medications will be held 1 day prior to surgery.  Injectable GLP-1 medications will be held 7 days prior to surgery.  Metformin is routinely held 48 hours prior to surgery due to renal concerns, potential need for contrasted imaging perioperatively, and the potential for tissue hypoxia leading to drug induced lactic acidosis.  All SGLT2i medications are held 72 hours prior to surgery as they can be associated with the  increased potential for developing euglycemic diabetic ketoacidosis (EDKA).   Impression and Plan:  Andrew Harding is on a prescribed GLP-1 medication, which induces the known side effect of decreased gastric emptying. Efforts are bring made to mitigate the risk of perioperative hyperglycemic events, as elevated blood glucose levels have been found to contribute to intra/postoperative complications. Additionally, hyperglycemic extremes can potentially necessitate the postponing of a patient's elective case in order to better optimize perioperative glycemic control, again with the aforementioned guidelines in place. With this in mind, recommendations have been sought from the prescribing provider, who has cleared patient to proceed with holding the prescribed GLP-1 as per the guidelines from the ASA.   Provider recommending: no further recommendations received from the prescribing provider.  Copy of signed clearance and recommendations placed on patient's chart for inclusion in their medical record and for review by the surgical/anesthetic team on the day of his procedure.   Honor Loh, MSN, APRN, FNP-C, CEN [facility]  Peri-operative Services Nurse Practitioner Phone: [phone] 06/08/22 4:28 PM  NOTE: This note has been prepared using Dragon dictation software. Despite my best ability to proofread, there is always the potential that unintentional transcriptional errors may still occur from this process.

## 2022-06-12 ENCOUNTER — Ambulatory Visit: Payer: Managed Care, Other (non HMO) | Admitting: Urgent Care

## 2022-06-12 ENCOUNTER — Other Ambulatory Visit: Payer: Self-pay

## 2022-06-12 ENCOUNTER — Encounter: Payer: Self-pay | Admitting: Urology

## 2022-06-12 ENCOUNTER — Ambulatory Visit
Admission: RE | Admit: 2022-06-12 | Discharge: 2022-06-12 | Disposition: A | Discharge status: Home / Self Care | Payer: Managed Care, Other (non HMO) | Attending: Urology | Admitting: Urology

## 2022-06-12 ENCOUNTER — Encounter
Admission: RE | Disposition: A | Discharge status: Home / Self Care | Payer: Self-pay | Source: Home / Self Care | Attending: Urology

## 2022-06-12 DIAGNOSIS — Z951 Presence of aortocoronary bypass graft: Secondary | ICD-10-CM | POA: Insufficient documentation

## 2022-06-12 DIAGNOSIS — I2510 Atherosclerotic heart disease of native coronary artery without angina pectoris: Secondary | ICD-10-CM | POA: Insufficient documentation

## 2022-06-12 DIAGNOSIS — I10 Essential (primary) hypertension: Secondary | ICD-10-CM | POA: Diagnosis not present

## 2022-06-12 DIAGNOSIS — E1142 Type 2 diabetes mellitus with diabetic polyneuropathy: Secondary | ICD-10-CM | POA: Insufficient documentation

## 2022-06-12 DIAGNOSIS — E1169 Type 2 diabetes mellitus with other specified complication: Secondary | ICD-10-CM

## 2022-06-12 DIAGNOSIS — N471 Phimosis: Secondary | ICD-10-CM

## 2022-06-12 DIAGNOSIS — E782 Mixed hyperlipidemia: Secondary | ICD-10-CM | POA: Insufficient documentation

## 2022-06-12 DIAGNOSIS — Z794 Long term (current) use of insulin: Secondary | ICD-10-CM | POA: Diagnosis not present

## 2022-06-12 DIAGNOSIS — Z01812 Encounter for preprocedural laboratory examination: Secondary | ICD-10-CM

## 2022-06-12 DIAGNOSIS — N476 Balanoposthitis: Secondary | ICD-10-CM

## 2022-06-12 HISTORY — PX: CIRCUMCISION: SHX1350

## 2022-06-12 LAB — GLUCOSE, CAPILLARY
Glucose-Capillary: 173 mg/dL — ABNORMAL HIGH (ref 70–99)
Glucose-Capillary: 183 mg/dL — ABNORMAL HIGH (ref 70–99)

## 2022-06-12 SURGERY — CIRCUMCISION, ADULT
Anesthesia: General | Site: Penis

## 2022-06-12 MED ORDER — EPHEDRINE SULFATE (PRESSORS) 50 MG/ML IJ SOLN
INTRAMUSCULAR | Status: DC | PRN
  Administered 2022-06-12: 5 mg via INTRAVENOUS

## 2022-06-12 MED ORDER — OXYCODONE HCL 5 MG PO TABS: 5.0000 mg | ORAL_TABLET | Freq: Once | ORAL | Status: DC | PRN

## 2022-06-12 MED ORDER — OXYCODONE HCL 5 MG/5ML PO SOLN: 5.0000 mg | Freq: Once | ORAL | Status: DC | PRN

## 2022-06-12 MED ORDER — SODIUM CHLORIDE 0.9 % IV SOLN: INTRAVENOUS | Status: DC

## 2022-06-12 MED ORDER — FENTANYL CITRATE (PF) 100 MCG/2ML IJ SOLN
INTRAMUSCULAR | Status: AC
  Filled 2022-06-12: qty 2

## 2022-06-12 MED ORDER — ONDANSETRON HCL 4 MG/2ML IJ SOLN
INTRAMUSCULAR | Status: DC | PRN
  Administered 2022-06-12: 4 mg via INTRAVENOUS

## 2022-06-12 MED ORDER — ORAL CARE MOUTH RINSE: 15.0000 mL | Freq: Once | OROMUCOSAL | Status: AC

## 2022-06-12 MED ORDER — FENTANYL CITRATE (PF) 100 MCG/2ML IJ SOLN
INTRAMUSCULAR | Status: DC | PRN
  Administered 2022-06-12 (×2): 50 ug via INTRAVENOUS

## 2022-06-12 MED ORDER — LIDOCAINE HCL (PF) 1 % IJ SOLN
INTRAMUSCULAR | Status: AC
  Filled 2022-06-12: qty 30

## 2022-06-12 MED ORDER — PROPOFOL 10 MG/ML IV BOLUS
INTRAVENOUS | Status: DC | PRN
  Administered 2022-06-12: 150 mg via INTRAVENOUS

## 2022-06-12 MED ORDER — PROPOFOL 10 MG/ML IV BOLUS
INTRAVENOUS | Status: AC
  Filled 2022-06-12: qty 40

## 2022-06-12 MED ORDER — CHLORHEXIDINE GLUCONATE 0.12 % MT SOLN: 15.0000 mL | Freq: Once | OROMUCOSAL | Status: AC

## 2022-06-12 MED ORDER — CHLORHEXIDINE GLUCONATE 0.12 % MT SOLN
OROMUCOSAL | Status: AC
  Administered 2022-06-12: 15 mL via OROMUCOSAL
  Filled 2022-06-12: qty 15

## 2022-06-12 MED ORDER — CEFAZOLIN SODIUM-DEXTROSE 2-4 GM/100ML-% IV SOLN
2.0000 g | INTRAVENOUS | Status: AC
  Administered 2022-06-12: 2 g via INTRAVENOUS

## 2022-06-12 MED ORDER — OXYCODONE-ACETAMINOPHEN 5-325 MG PO TABS: 1.0000 | ORAL_TABLET | ORAL | 0 refills | Status: DC | PRN

## 2022-06-12 MED ORDER — 0.9 % SODIUM CHLORIDE (POUR BTL) OPTIME
TOPICAL | Status: DC | PRN
  Administered 2022-06-12: 500 mL

## 2022-06-12 MED ORDER — CEFAZOLIN SODIUM-DEXTROSE 2-4 GM/100ML-% IV SOLN
INTRAVENOUS | Status: AC
  Filled 2022-06-12: qty 100

## 2022-06-12 MED ORDER — LIDOCAINE 1% INJECTION FOR CIRCUMCISION
INJECTION | INTRAVENOUS | Status: DC | PRN
  Administered 2022-06-12: 20 mL

## 2022-06-12 MED ORDER — LIDOCAINE HCL (CARDIAC) PF 100 MG/5ML IV SOSY
PREFILLED_SYRINGE | INTRAVENOUS | Status: DC | PRN
  Administered 2022-06-12: 100 mg via INTRAVENOUS

## 2022-06-12 MED ORDER — FAMOTIDINE 20 MG PO TABS
ORAL_TABLET | ORAL | Status: AC
  Administered 2022-06-12: 20 mg
  Filled 2022-06-12: qty 1

## 2022-06-12 MED ORDER — FENTANYL CITRATE (PF) 100 MCG/2ML IJ SOLN: 25.0000 ug | INTRAMUSCULAR | Status: DC | PRN

## 2022-06-12 SURGICAL SUPPLY — 29 items
APL PRP STRL LF DISP 70% ISPRP (MISCELLANEOUS) ×1
BLADE CLIPPER SURG (BLADE) ×1 IMPLANT
BLADE SURG 15 STRL LF DISP TIS (BLADE) ×1 IMPLANT
BLADE SURG 15 STRL SS (BLADE) ×1
BNDG CMPR 5X1 CHSV STRCH NS (GAUZE/BANDAGES/DRESSINGS) ×1
BNDG CMPR 75X21 PLY HI ABS (MISCELLANEOUS) ×1
BNDG COHESIVE 1X5 TAN NS LF (GAUZE/BANDAGES/DRESSINGS) IMPLANT
CHLORAPREP W/TINT 26 (MISCELLANEOUS) ×1 IMPLANT
DRAPE LAPAROTOMY 77X122 PED (DRAPES) ×1 IMPLANT
ELECT REM PT RETURN 9FT ADLT (ELECTROSURGICAL) ×1
ELECTRODE REM PT RTRN 9FT ADLT (ELECTROSURGICAL) ×1 IMPLANT
GAUZE 4X4 16PLY ~~LOC~~+RFID DBL (SPONGE) ×1 IMPLANT
GAUZE PETROLATUM 1 X8 (GAUZE/BANDAGES/DRESSINGS) ×1 IMPLANT
GAUZE STRETCH 2X75IN STRL (MISCELLANEOUS) ×1 IMPLANT
GLOVE BIO SURGEON STRL SZ 6.5 (GLOVE) ×1 IMPLANT
GOWN STRL REUS W/ TWL LRG LVL3 (GOWN DISPOSABLE) ×2 IMPLANT
GOWN STRL REUS W/TWL LRG LVL3 (GOWN DISPOSABLE) ×2
KIT TURNOVER KIT A (KITS) ×1 IMPLANT
LABEL OR SOLS (LABEL) ×1 IMPLANT
MANIFOLD NEPTUNE II (INSTRUMENTS) ×1 IMPLANT
NDL HYPO 25X1 1.5 SAFETY (NEEDLE) ×1 IMPLANT
NEEDLE HYPO 25X1 1.5 SAFETY (NEEDLE) ×1 IMPLANT
NS IRRIG 500ML POUR BTL (IV SOLUTION) ×1 IMPLANT
PACK BASIN MINOR ARMC (MISCELLANEOUS) ×1 IMPLANT
SOL PREP PVP 2OZ (MISCELLANEOUS) ×1
SOLUTION PREP PVP 2OZ (MISCELLANEOUS) ×1 IMPLANT
SUT CHROMIC 3 0 SH 27 (SUTURE) ×2 IMPLANT
SYR 10ML LL (SYRINGE) ×1 IMPLANT
TRAP FLUID SMOKE EVACUATOR (MISCELLANEOUS) ×1 IMPLANT

## 2022-06-12 NOTE — Anesthesia Postprocedure Evaluation (Signed)
Anesthesia Post Note  [name]  Procedure(s) Performed: CIRCUMCISION ADULT (Penis)  Patient location during evaluation: PACU Anesthesia Type: General Level of consciousness: awake and alert Pain management: pain level controlled Vital Signs Assessment: post-procedure vital signs reviewed and stable Respiratory status: spontaneous breathing, nonlabored ventilation, respiratory function stable and patient connected to nasal cannula oxygen Cardiovascular status: blood pressure returned to baseline and stable Postop Assessment: no apparent nausea or vomiting Anesthetic complications: no  No notable events documented.   Last Vitals:  Vitals:   06/12/22 0915 06/12/22 0924  BP: (!) 149/84 (!) 148/89  Pulse:  89  Resp:  16  Temp: 36.6 C (!) 36.1 C  SpO2:  98%    Last Pain:  Vitals:   06/12/22 0924  TempSrc: Temporal  PainSc: 0-No pain                 Dimas Millin

## 2022-06-12 NOTE — Op Note (Signed)
Date of procedure: 06/12/22  Preoperative diagnosis:  Phimosis  Postoperative diagnosis:  Same as above  Procedure: Circumcision  Surgeon: Hollice Espy, MD  Anesthesia: General  Complications: None  Intraoperative findings: Very tight phimotic foreskin  EBL: Minimal  Specimens: None  Drains: None  Indication: Andrew Harding is a 72 y.o. patient with severe phimosis and history of balanoposthitis.  After reviewing the management options for treatment, he elected to proceed with the above surgical procedure(s). We have discussed the potential benefits and risks of the procedure, side effects of the proposed treatment, the likelihood of the patient achieving the goals of the procedure, and any potential problems that might occur during the procedure or recuperation. Informed consent has been obtained.  Description of procedure:  The patient was taken to the operating room and general anesthesia was induced.  The patient was placed in the dorsal lithotomy position, prepped and draped in the usual sterile fashion, and preoperative antibiotics were administered. A preoperative time-out was performed.   -A dorsal penile and ring block were administered using 20 cc 1% lidocaine.  The surgical site was then tested and adequate anesthesia was achieved.    Procedure: -Initially the glans was not able to be visualized, I performed a dorsal slit by clamping the phimotic foreskin dorsally with a straight clamp and incising it -The glans was then exposed and cleaned with additional Betadine solution -Two circumferential incision were made on the penile skin, one in the mid shaft and another approximately 1 cm proximal to the coronal margin using a blade. -The foreskin was then removed in a sleeve-like fashion with care taken to avoid injury to the urethra.  - Bleeding points were cauterized and adequate hemostasis was achieved - Skin margins were reapproximated with interrupted 3-0 chromic  catgut sutures. -A dressing of Xeroform and gauze was applied.     Post-Procedure: -RTC in 4 weeks for wound check  Hollice Espy, M.D.

## 2022-06-12 NOTE — Discharge Instructions (Addendum)
You have a dressing on your penis.  I would like this to try to stay in place for 2 days.  If you have difficulty urinating or the dressing becomes soiled, can be removed prior to the 48-hour mark.  Once the dressing is removed, you will see stitches.  They absorb on their own.  You may use bacitracin ointment or Vaseline to keep this from sticking to your Underwear.   AMBULATORY SURGERY  DISCHARGE INSTRUCTIONS  The drugs that you were given will stay in your system until tomorrow so for the next 24 hours you should not:  Drive an automobile Make any legal decisions Drink any alcoholic beverage  You may resume regular meals tomorrow.  Today it is better to start with liquids and gradually work up to solid foods.  You may eat anything you prefer, but it is better to start with liquids, then soup and crackers, and gradually work up to solid foods.  Please notify your doctor immediately if you have any unusual bleeding, trouble breathing, redness and pain at the surgery site, drainage, fever, or pain not relieved by medication.  Additional Instructions:  Please contact your physician with any problems or Same Day Surgery at [phone], Monday through Friday 6 am to 4 pm, or [facility] number at [phone].         AMBULATORY SURGERY  DISCHARGE INSTRUCTIONS   The drugs that you were given will stay in your system until tomorrow so for the next 24 hours you should not:  Drive an automobile Make any legal decisions Drink any alcoholic beverage   You may resume regular meals tomorrow.  Today it is better to start with liquids and gradually work up to solid foods.  You may eat anything you prefer, but it is better to start with liquids, then soup and crackers, and gradually work up to solid foods.   Please notify your doctor immediately if you have any unusual bleeding, trouble breathing, redness and pain at the surgery site, drainage, fever, or pain not  relieved by medication.     Your post-operative visit with Dr.                                       is: Date:                        Time:    Please call to schedule your post-operative visit.  Additional Instructions:

## 2022-06-12 NOTE — Anesthesia Preprocedure Evaluation (Signed)
Anesthesia Evaluation  Patient identified by MRN, date of birth, ID band Patient awake    Reviewed: Allergy & Precautions, H&P , NPO status , Patient's Chart, lab work & pertinent test results  History of Anesthesia Complications Negative for: history of anesthetic complications  Airway Mallampati: III  TM Distance: >3 FB Neck ROM: limited    Dental  (+) Poor Dentition, Dental Advidsory Given   Pulmonary neg pulmonary ROS, neg shortness of breath, neg COPD, Current Smoker and Patient abstained from smoking.   Pulmonary exam normal breath sounds clear to auscultation       Cardiovascular Exercise Tolerance: Good hypertension, (-) angina + CAD and + CABG  (-) DOE Normal cardiovascular exam+ Valvular Problems/Murmurs  Rhythm:regular Rate:Normal     Neuro/Psych negative neurological ROS  negative psych ROS   GI/Hepatic negative GI ROS, Neg liver ROS,,,  Endo/Other  diabetes, Type 2, Insulin Dependent    Renal/GU negative Renal ROS  negative genitourinary   Musculoskeletal   Abdominal   Peds  Hematology negative hematology ROS (+)   Anesthesia Other Findings Past Medical History: No date: Coronary artery disease No date: Diabetes mellitus without complication (HCC) No date: Diabetic neuropathy (HCC) No date: Gout No date: Hyperlipidemia No date: Hypertension No date: Vitamin D deficiency  Past Surgical History: 06/01/2015: AMPUTATION TOE Right     Comment: Procedure: AMPUTATION TOE;  Surgeon: Albertine Patricia, DPM;  Location: [facility] ORS;  Service:               Podiatry;  Laterality: Right; No date: CATARACT EXTRACTION, BILATERAL No date: CORONARY ARTERY BYPASS GRAFT 06/01/2015: EXCISION PARTIAL PHALANX Right     Comment: Procedure: EXCISION PARTIAL PHALANX /  BONE;                Surgeon: Albertine Patricia, DPM;  Location: [facility]               ORS;  Service: Podiatry;  Laterality: Right; No date: KNEE  ARTHROSCOPY     Comment: Left knee   BMI    Body Mass Index:  33.62 kg/m      Reproductive/Obstetrics negative OB ROS                             Anesthesia Physical Anesthesia Plan  ASA: 3  Anesthesia Plan: General   Post-op Pain Management:    Induction: Intravenous  PONV Risk Score and Plan: 2 and Midazolam, Dexamethasone and Ondansetron  Airway Management Planned: LMA  Additional Equipment:   Intra-op Plan:   Post-operative Plan: Extubation in OR  Informed Consent: I have reviewed the patients History and Physical, chart, labs and discussed the procedure including the risks, benefits and alternatives for the proposed anesthesia with the patient or authorized representative who has indicated his/her understanding and acceptance.     Dental Advisory Given  Plan Discussed with: Anesthesiologist, CRNA and Surgeon  Anesthesia Plan Comments: (Patient consented for risks of anesthesia including but not limited to:  - adverse reactions to medications - damage to eyes, teeth, lips or other oral mucosa - nerve damage due to positioning  - sore throat or hoarseness - Damage to heart, brain, nerves, lungs, other parts of body or loss of life  Patient voiced understanding.)        Anesthesia Quick Evaluation

## 2022-06-12 NOTE — Transfer of Care (Signed)
Immediate Anesthesia Transfer of Care Note  [name]  Procedure(s) Performed: CIRCUMCISION ADULT (Penis)  Patient Location: PACU  Anesthesia Type:General  Level of Consciousness: awake  Airway & Oxygen Therapy: Patient Spontanous Breathing and Patient connected to face mask oxygen  Post-op Assessment: Report given to RN and Post -op Vital signs reviewed and stable  Post vital signs: Reviewed and stable  Last Vitals:  Vitals Value Taken Time  BP    Temp    Pulse    Resp    SpO2      Last Pain:  Vitals:   06/12/22 0624  TempSrc: Tympanic         Complications: No notable events documented.

## 2022-06-12 NOTE — Interval H&P Note (Signed)
History and Physical Interval Note:  06/12/2022 7:24 AM  Andrew Harding  has presented today for surgery, with the diagnosis of Phimosis.  The various methods of treatment have been discussed with the patient and family. After consideration of risks, benefits and other options for treatment, the patient has consented to  Procedure(s): CIRCUMCISION ADULT (N/A) as a surgical intervention.  The patient's history has been reviewed, patient examined, no change in status, stable for surgery.  I have reviewed the patient's chart and labs.  Questions were answered to the patient's satisfaction.    RRR CTAB  Hollice Espy

## 2022-07-10 NOTE — Progress Notes (Unsigned)
07/11/2022 9:26 AM   Andrew Harding 07-08-1950 [id number]  Referring provider: Adin Hector, MD [place] [facility] [place],  [place] [address]  Urological history: 1.  Phimosis -s/p circumcision (06/12/2022)   2. BPH with LU TS -PSA (10/2021) 1.79  -finasteride 5 mg daily ***  3.  Nephrolithiasis -CT (2014) 1 mm nonobstructing right upper pole stone  No chief complaint on file.   HPI: Andrew Harding is a 72 y.o. male who presents today for wound check after circumcision performed on June 12, 2022 with Dr. Hollice Espy.  His postoperative course has been as expected and uneventful.   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   Tubular adenoma    Vitamin D deficiency     Surgical History: Past Surgical History:  Procedure Laterality Date   AMPUTATION TOE Right 06/01/2015   Procedure: AMPUTATION TOE;  Surgeon: Albertine Patricia, DPM;  Location: [facility] ORS;  Service: Podiatry;  Laterality: Right;   CATARACT EXTRACTION, BILATERAL     CIRCUMCISION N/A 06/12/2022   Procedure: CIRCUMCISION ADULT;  Surgeon: Hollice Espy, MD;  Location: [facility] ORS;  Service: Urology;  Laterality: N/A;   COLONOSCOPY WITH PROPOFOL N/A 02/14/2016   Procedure: COLONOSCOPY WITH PROPOFOL;  Surgeon: Lollie Sails, MD;  Location: [facility] ENDOSCOPY;  Service: Endoscopy;  Laterality: N/A;   COLONOSCOPY WITH PROPOFOL N/A 01/07/2019   Procedure: COLONOSCOPY WITH PROPOFOL;  Surgeon: Lollie Sails, MD;  Location: [facility] ENDOSCOPY;  Service: Endoscopy;  Laterality: N/A;   CORONARY ARTERY BYPASS GRAFT N/A 08/2012   EXCISION  PARTIAL PHALANX Right 06/01/2015   Procedure: EXCISION PARTIAL PHALANX /  BONE;  Surgeon: Albertine Patricia, DPM;  Location: [facility] ORS;  Service: Podiatry;  Laterality: Right;   FLEXIBLE SIGMOIDOSCOPY N/A 05/29/2016   Procedure: FLEXIBLE SIGMOIDOSCOPY;  Surgeon: Lollie Sails, MD;  Location: [facility] ENDOSCOPY;  Service: Endoscopy;  Laterality: N/A;   KNEE ARTHROSCOPY Left     Home Medications:  Allergies as of 07/11/2022   No Known Allergies      Medication List        Accurate as of July 10, 2022  9:26 AM. If you have any questions, ask your nurse or doctor.          aspirin EC 81 MG tablet Take 81 mg by mouth daily.   cholecalciferol 1000 units tablet Commonly known as: VITAMIN D Take 2,000 Units by mouth daily.   Fifty50 Pen Needles 32G X 4 MM Misc Generic drug: Insulin Pen Needle USE 4 TIMES DAILY   finasteride 5 MG tablet Commonly known as: PROSCAR Take 5 mg by mouth daily.   gabapentin 300 MG capsule Commonly known as: NEURONTIN Take 300 mg by mouth 3 (three) times daily.   insulin glargine 100 UNIT/ML injection Commonly known as: LANTUS Inject 52 Units into the skin at bedtime.   Basaglar KwikPen 100 UNIT/ML Inject into the skin daily. 15 -units in am, 18 units lunch, 20 supper   lisinopril 5 MG tablet Commonly known as: ZESTRIL  TAKE ONE TABLET BY MOUTH EVERY DAY FOR BLOOD PRESSURE   metFORMIN 500 MG 24 hr tablet Commonly known as: GLUCOPHAGE-XR 500 mg 2 (two) times daily with a meal.   OneTouch Ultra test strip Generic drug: glucose blood Use 3 (three) times daily   oxyCODONE-acetaminophen 5-325 MG tablet Commonly known as: Percocet Take 1-2 tablets by mouth every 4 (four) hours as needed for moderate pain or severe pain.   pravastatin 80 MG tablet Commonly known as: PRAVACHOL TAKE ONE TABLET BY MOUTH AT NIGHT   traZODone 50 MG tablet Commonly known as: DESYREL TAKE ONE TABLET AT BEDTIME AS NEEDED   Trulicity 1.5 0000000 Sopn Generic  drug: Dulaglutide        Allergies: No Known Allergies  Family History: Family History  Problem Relation Age of Onset   Diabetes Mellitus II Mother    CAD Mother     Social History:  reports that he has been smoking cigars. He has never used smokeless tobacco. He reports that he does not drink alcohol and does not use drugs.  ROS: Pertinent ROS in HPI  Physical Exam: There were no vitals taken for this visit.  Constitutional:  Well nourished. Alert and oriented, No acute distress. HEENT: [place] AT, moist mucus membranes.  Trachea midline, no masses. Cardiovascular: No clubbing, cyanosis, or edema. Respiratory: Normal respiratory effort, no increased work of breathing. GI: Abdomen is soft, non tender, non distended, no abdominal masses. Liver and spleen not palpable.  No hernias appreciated.  Stool sample for occult testing is not indicated.   GU: No CVA tenderness.  No bladder fullness or masses.  Patient with circumcised/uncircumcised phallus. ***Foreskin easily retracted***  Urethral meatus is patent.  No penile discharge. No penile lesions or rashes. Scrotum without lesions, cysts, rashes and/or edema.  Testicles are located scrotally bilaterally. No masses are appreciated in the testicles. Left and right epididymis are normal. Rectal: Patient with  normal sphincter tone. Anus and perineum without scarring or rashes. No rectal masses are appreciated. Prostate is approximately *** grams, *** nodules are appreciated. Seminal vesicles are normal. Skin: No rashes, bruises or suspicious lesions. Lymph: No cervical or inguinal adenopathy. Neurologic: Grossly intact, no focal deficits, moving all 4 extremities. Psychiatric: Normal mood and affect.  Laboratory Data: Serum creatinine (05/2022) 1.5 Hemoglobin A1c (05/2022) 7.4 Total cholesterol (05/2022) 141 TSH (05/2022) 1.626 Uric acid (05/2022) 7.5 Color Colorless, Straw, Light Yellow, Yellow, Dark Yellow Light Yellow  Clarity Clear  Clear  Specific Gravity 1.005 - 1.030 1.021  pH, Urine 5.0 - 8.0 6.0  Protein, Urinalysis Negative mg/dL 1+ Abnormal   Glucose, Urinalysis Negative mg/dL 3+ Abnormal   Ketones, Urinalysis Negative mg/dL Negative  Blood, Urinalysis Negative Trace Abnormal   Nitrite, Urinalysis Negative Negative  Leukocyte Esterase, Urinalysis Negative Negative  Bilirubin, Urinalysis Negative Negative  Urobilinogen, Urinalysis 0.2 - 1.0 mg/dL 0.2  WBC, UA <=5 /hpf 0  Red Blood Cells, Urinalysis <=3 /hpf 1  Bacteria, Urinalysis 0 - 5 /hpf 0-5  Squamous Epithelial Cells, Urinalysis /hpf 0  Resulting Agency  [facility]   Specimen Collected: 05/29/22 07:34   Performed by: [place]: 05/29/22 12:47  Received From: [facility]  Result Received: 05/30/22 08:11  I have reviewed the labs.   Pertinent Imaging: N/A  Assessment & Plan:    1. Phimosis -s/p circumcision   No follow-ups on file.  These notes generated with voice recognition software. I apologize for typographical errors.  [place], [place] [address]  [place], [place] [address] [phone]

## 2022-07-11 ENCOUNTER — Encounter: Payer: Self-pay | Admitting: Urology

## 2022-07-11 ENCOUNTER — Ambulatory Visit (INDEPENDENT_AMBULATORY_CARE_PROVIDER_SITE_OTHER): Payer: Managed Care, Other (non HMO) | Admitting: Urology

## 2022-07-11 VITALS — BP 122/76 | HR 77 | Ht 75.0 in | Wt 245.0 lb

## 2022-07-11 DIAGNOSIS — Z87438 Personal history of other diseases of male genital organs: Secondary | ICD-10-CM

## 2022-07-11 DIAGNOSIS — N471 Phimosis: Secondary | ICD-10-CM

## 2022-07-11 DIAGNOSIS — N99110 Postprocedural urethral stricture, male, meatal: Secondary | ICD-10-CM

## 2022-07-11 DIAGNOSIS — N476 Balanoposthitis: Secondary | ICD-10-CM

## 2022-07-11 MED ORDER — BETAMETHASONE DIPROPIONATE 0.05 % EX CREA: TOPICAL_CREAM | Freq: Two times a day (BID) | CUTANEOUS | 0 refills | Status: DC

## 2022-07-25 NOTE — Progress Notes (Deleted)
07/11/2022 9:13 AM   [name] 06/24/1950 [id number]  Referring provider: Adin Hector, MD [facility] [place],  [place] [address]  Urological history: 1.  Phimosis -s/p circumcision (06/12/2022)   2. BPH with LU TS -PSA (10/2021) 1.79   3.  Nephrolithiasis -CT (2014) 1 mm nonobstructing right upper pole stone  Chief Complaint  Patient presents with   Follow-up    HPI: Andrew Harding is a 72 y.o. male who presents today for wound check after circumcision performed on June 12, 2022 with Dr. Hollice Espy.  At his appointment on 07/11/2022, his postoperative course had been as expected and uneventful.  He was very satisfied with results.  He stated he was urinating without issue.  He denied any straining to urinate, weak urinary stream or spraying of the urinary stream.  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   Tubular adenoma    Vitamin D deficiency     Surgical History: Past Surgical History:  Procedure Laterality Date   AMPUTATION TOE Right 06/01/2015   Procedure: AMPUTATION TOE;  Surgeon: Albertine Patricia, DPM;  Location: [facility] ORS;  Service: Podiatry;  Laterality: Right;   CATARACT EXTRACTION, BILATERAL     CIRCUMCISION N/A 06/12/2022   Procedure: CIRCUMCISION ADULT;  Surgeon: Hollice Espy, MD;  Location: [facility] ORS;  Service: Urology;  Laterality: N/A;   COLONOSCOPY WITH PROPOFOL N/A  02/14/2016   Procedure: COLONOSCOPY WITH PROPOFOL;  Surgeon: Lollie Sails, MD;  Location: [facility] ENDOSCOPY;  Service: Endoscopy;  Laterality: N/A;   COLONOSCOPY WITH PROPOFOL N/A 01/07/2019   Procedure: COLONOSCOPY WITH PROPOFOL;  Surgeon: Lollie Sails, MD;  Location: [facility] ENDOSCOPY;  Service: Endoscopy;  Laterality: N/A;   CORONARY ARTERY BYPASS GRAFT N/A 08/2012   EXCISION PARTIAL PHALANX Right 06/01/2015   Procedure: EXCISION PARTIAL PHALANX /  BONE;  Surgeon: Albertine Patricia, DPM;  Location: [facility] ORS;  Service: Podiatry;  Laterality: Right;   FLEXIBLE SIGMOIDOSCOPY N/A 05/29/2016   Procedure: FLEXIBLE SIGMOIDOSCOPY;  Surgeon: Lollie Sails, MD;  Location: [facility] ENDOSCOPY;  Service: Endoscopy;  Laterality: N/A;   KNEE ARTHROSCOPY Left     Home Medications:  Allergies as of 07/11/2022   No Known Allergies      Medication List        Accurate as of July 11, 2022  9:13 AM. If you have any questions, ask your nurse or doctor.          amoxicillin-clavulanate 875-125 MG tablet Commonly known as: AUGMENTIN Take by mouth.   aspirin EC 81 MG tablet Take 81 mg by mouth daily.   betamethasone dipropionate 0.05 % cream Apply topically 2 (two) times daily. Started by: Zara Council, PA-C   cholecalciferol 1000 units tablet Commonly known as: VITAMIN D Take 2,000 Units by mouth daily.   Fifty50 Pen Needles  32G X 4 MM Misc Generic drug: Insulin Pen Needle USE 4 TIMES DAILY   finasteride 5 MG tablet Commonly known as: PROSCAR Take 5 mg by mouth daily.   gabapentin 300 MG capsule Commonly known as: NEURONTIN Take 300 mg by mouth 3 (three) times daily.   insulin glargine 100 UNIT/ML injection Commonly known as: LANTUS Inject 52 Units into the skin at bedtime.   Basaglar KwikPen 100 UNIT/ML Inject into the skin daily. 15 -units in am, 18 units lunch, 20 supper   lisinopril 5 MG tablet Commonly known as: ZESTRIL TAKE ONE TABLET BY MOUTH EVERY DAY FOR  BLOOD PRESSURE   metFORMIN 500 MG 24 hr tablet Commonly known as: GLUCOPHAGE-XR 500 mg 2 (two) times daily with a meal.   OneTouch Ultra test strip Generic drug: glucose blood Use 3 (three) times daily   oxyCODONE-acetaminophen 5-325 MG tablet Commonly known as: Percocet Take 1-2 tablets by mouth every 4 (four) hours as needed for moderate pain or severe pain.   pravastatin 80 MG tablet Commonly known as: PRAVACHOL TAKE ONE TABLET BY MOUTH AT NIGHT   silver sulfADIAZINE 1 % cream Commonly known as: SILVADENE Apply topically.   traZODone 50 MG tablet Commonly known as: DESYREL TAKE ONE TABLET AT BEDTIME AS NEEDED   Trulicity 1.5 0000000 Sopn Generic drug: Dulaglutide        Allergies: No Known Allergies  Family History: Family History  Problem Relation Age of Onset   Diabetes Mellitus II Mother    CAD Mother     Social History:  reports that he has been smoking cigars. He has never used smokeless tobacco. He reports that he does not drink alcohol and does not use drugs.  ROS: Pertinent ROS in HPI  Physical Exam: BP 122/76   Pulse 77   Ht 6\' 3"  (1.905 m)   Wt 245 lb (111.1 kg)   BMI 30.62 kg/m   Constitutional:  Well nourished. Alert and oriented, No acute distress. HEENT: [employer] AT, moist mucus membranes.  Trachea midline Cardiovascular: No clubbing, cyanosis, or edema. Respiratory: Normal respiratory effort, no increased work of breathing. GU: No CVA tenderness.  No bladder fullness or masses.  Patient with circumcised/uncircumcised phallus. ***Foreskin easily retracted***  Urethral meatus is patent.  No penile discharge. No penile lesions or rashes. Scrotum without lesions, cysts, rashes and/or edema.  Testicles are located scrotally bilaterally. No masses are appreciated in the testicles. Left and right epididymis are normal. Rectal: Patient with  normal sphincter tone. Anus and perineum without scarring or rashes. No rectal masses are appreciated. Prostate  is approximately *** grams, *** nodules are appreciated. Seminal vesicles are normal. Neurologic: Grossly intact, no focal deficits, moving all 4 extremities. Psychiatric: Normal mood and affect.   Laboratory Data: N/A    Pertinent Imaging: N/A  Assessment & Plan:    1. Phimosis -s/p circumcision -Advised him to increase his fluids for the next couple weeks so that he has frequent urination to keep the meatal opening patent -Prescribed Diprolene cream to apply twice daily to the meatus to soften the keratinized skin of the glans around the opening  Return in about 2 weeks (around 07/25/2022) for recheck .  These notes generated with voice recognition software. I apologize for typographical errors.  [place], [place] [address]  [place] [employer] [place], [place] [address] [phone]

## 2022-07-26 ENCOUNTER — Ambulatory Visit: Payer: Managed Care, Other (non HMO) | Admitting: Urology

## 2022-07-26 ENCOUNTER — Encounter: Payer: Self-pay | Admitting: Urology

## 2022-07-26 DIAGNOSIS — N471 Phimosis: Secondary | ICD-10-CM

## 2022-08-08 ENCOUNTER — Other Ambulatory Visit: Payer: Self-pay | Admitting: Podiatry

## 2022-08-10 ENCOUNTER — Encounter
Admission: RE | Admit: 2022-08-10 | Discharge: 2022-08-10 | Disposition: A | Discharge status: Home / Self Care | Payer: Managed Care, Other (non HMO) | Source: Ambulatory Visit | Attending: Podiatry | Admitting: Podiatry

## 2022-08-10 ENCOUNTER — Other Ambulatory Visit: Payer: Self-pay

## 2022-08-10 DIAGNOSIS — Z01812 Encounter for preprocedural laboratory examination: Secondary | ICD-10-CM

## 2022-08-10 HISTORY — DX: Chronic kidney disease, unspecified: N18.9

## 2022-08-10 NOTE — Pre-Procedure Instructions (Addendum)
Patient takes Trulicity weekly as prescribed, he reports that his last injection was done on 08/06/22, this medication must be held 7 days prior to his procedure per anesthesia guidelines. Quentin Mulling NP and [name] DNP made aware. Surgery scheduler made aware also.

## 2022-08-10 NOTE — Patient Instructions (Addendum)
Your procedure is scheduled on: 08/11/22  Report to the Registration Desk on the 1st floor of the [facility]. To find out your arrival time, please call [phone] between 1PM - 3PM on: 08/10/22 If your arrival time is 6:00 am, do not arrive before that time as the [facility] entrance doors do not open until 6:00 am.  REMEMBER: Instructions that are not followed completely may result in serious medical risk, up to and including death; or upon the discretion of your surgeon and anesthesiologist your surgery may need to be rescheduled.  Do not eat food after midnight the night before surgery.  No gum chewing or hard candies.  You may however, drink CLEAR liquids up to 2 hours before you are scheduled to arrive for your surgery. Do not drink anything within 2 hours of your scheduled arrival time.  Clear liquids include: - water   One week prior to surgery: Stop Anti-inflammatories (NSAIDS) such as Advil, Aleve, Ibuprofen, Motrin, Naproxen, Naprosyn and Aspirin based products such as Excedrin, Goody's Powder, BC Powder.  Stop ANY OVER THE COUNTER supplements until after surgery.  You may however, continue to take Tylenol if needed for pain up until the day of surgery.  Continue taking all prescribed medications with the exception of the following:  HOLD morning dose of Insulin Glargine [facility] on the day of surgery. Inject 1/2 of your prescribed dose of insulin glargine (LANTUS) on the night before your surgery. Hold your TRULICITY for 7 days prior to your surgery. HOLD your metFORMIN (GLUCOPHAGE-XR) beginning 08/09/22.  TAKE ONLY THESE MEDICATIONS THE MORNING OF SURGERY WITH A SIP OF WATER:  gabapentin (NEURONTIN)  oxyCODONE-acetaminophen (PERCOCET)  cefdinir (OMNICEF)    No Alcohol for 24 hours before or after surgery.  No Smoking including e-cigarettes for 24 hours before surgery.  No chewable tobacco products for at least 6 hours before surgery.  No  nicotine patches on the day of surgery.  Do not use any "recreational" drugs for at least a week (preferably 2 weeks) before your surgery.  Please be advised that the combination of cocaine and anesthesia may have negative outcomes, up to and including death. If you test positive for cocaine, your surgery will be cancelled.  On the morning of surgery brush your teeth with toothpaste and water, you may rinse your mouth with mouthwash if you wish. Do not swallow any toothpaste or mouthwash.  Use CHG Soap or wipes as directed on instruction sheet.  Do not wear jewelry, make-up, hairpins, clips or nail polish.  Do not wear lotions, powders, or perfumes.   Do not shave body hair from the neck down 48 hours before surgery.  Contact lenses, hearing aids and dentures may not be worn into surgery.  Do not bring valuables to the hospital. [facility] is not responsible for any missing/lost belongings or valuables.   Notify your doctor if there is any change in your medical condition (cold, fever, infection).  Wear comfortable clothing (specific to your surgery type) to the hospital.  After surgery, you can help prevent lung complications by doing breathing exercises.  Take deep breaths and cough every 1-2 hours. Your doctor may order a device called an Incentive Spirometer to help you take deep breaths. When coughing or sneezing, hold a pillow firmly against your incision with both hands. This is called "splinting." Doing this helps protect your incision. It also decreases belly discomfort.  If you are being admitted to the hospital overnight, leave your suitcase in  the car. After surgery it may be brought to your room.  In case of increased patient census, it may be necessary for you, the patient, to continue your postoperative care in the Same Day Surgery department.  If you are being discharged the day of surgery, you will not be allowed to drive home. You will need a responsible  individual to drive you home and stay with you for 24 hours after surgery.   If you are taking public transportation, you will need to have a responsible individual with you.  Please call the Pre-admissions Testing Dept. at [phone] if you have any questions about these instructions.  Surgery Visitation Policy:  Patients having surgery or a procedure may have two visitors.  Children under the age of 66 must have an adult with them who is not the patient.  Inpatient Visitation:    Visiting hours are 7 a.m. to 8 p.m. Up to four visitors are allowed at one time in a patient room. The visitors may rotate out with other people during the day.  One visitor age 28 or older may stay with the patient overnight and must be in the room by 8 p.m.    Preparing for Surgery with CHLORHEXIDINE GLUCONATE (CHG) Soap  Chlorhexidine Gluconate (CHG) Soap  o An antiseptic cleaner that kills germs and bonds with the skin to continue killing germs even after washing  o Used for showering the night before surgery and morning of surgery  Before surgery, you can play an important role by reducing the number of germs on your skin.  CHG (Chlorhexidine gluconate) soap is an antiseptic cleanser which kills germs and bonds with the skin to continue killing germs even after washing.  Please do not use if you have an allergy to CHG or antibacterial soaps. If your skin becomes reddened/irritated stop using the CHG.  1. Shower the NIGHT BEFORE SURGERY and the MORNING OF SURGERY with CHG soap.  2. If you choose to wash your hair, wash your hair first as usual with your normal shampoo.  3. After shampooing, rinse your hair and body thoroughly to remove the shampoo.  4. Use CHG as you would any other liquid soap. You can apply CHG directly to the skin and wash gently with a scrungie or a clean washcloth.  5. Apply the CHG soap to your body only from the neck down. Do not use on open wounds or open sores.  Avoid contact with your eyes, ears, mouth, and genitals (private parts). Wash face and genitals (private parts) with your normal soap.  6. Wash thoroughly, paying special attention to the area where your surgery will be performed.  7. Thoroughly rinse your body with warm water.  8. Do not shower/wash with your normal soap after using and rinsing off the CHG soap.  9. Pat yourself dry with a clean towel.  10. Wear clean pajamas to bed the night before surgery.  12. Place clean sheets on your bed the night of your first shower and do not sleep with pets.  13. Shower again with the CHG soap on the day of surgery prior to arriving at the hospital.  14. Do not apply any deodorants/lotions/powders.  15. Please wear clean clothes to the hospital. How to Use an Incentive Spirometer  An incentive spirometer is a tool that measures how well you are filling your lungs with each breath. Learning to take long, deep breaths using this tool can help you keep your lungs clear and  active. This may help to reverse or lessen your chance of developing breathing (pulmonary) problems, especially infection. You may be asked to use a spirometer: After a surgery. If you have a lung problem or a history of smoking. After a long period of time when you have been unable to move or be active. If the spirometer includes an indicator to show the highest number that you have reached, your health care provider or respiratory therapist will help you set a goal. Keep a log of your progress as told by your health care provider. What are the risks? Breathing too quickly may cause dizziness or cause you to pass out. Take your time so you do not get dizzy or light-headed. If you are in pain, you may need to take pain medicine before doing incentive spirometry. It is harder to take a deep breath if you are having pain. How to use your incentive spirometer  Sit up on the edge of your bed or on a chair. Hold the incentive  spirometer so that it is in an upright position. Before you use the spirometer, breathe out normally. Place the mouthpiece in your mouth. Make sure your lips are closed tightly around it. Breathe in slowly and as deeply as you can through your mouth, causing the piston or the ball to rise toward the top of the chamber. Hold your breath for 3-5 seconds, or for as long as possible. If the spirometer includes a coach indicator, use this to guide you in breathing. Slow down your breathing if the indicator goes above the marked areas. Remove the mouthpiece from your mouth and breathe out normally. The piston or ball will return to the bottom of the chamber. Rest for a few seconds, then repeat the steps 10 or more times. Take your time and take a few normal breaths between deep breaths so that you do not get dizzy or light-headed. Do this every 1-2 hours when you are awake. If the spirometer includes a goal marker to show the highest number you have reached (best effort), use this as a goal to work toward during each repetition. After each set of 10 deep breaths, cough a few times. This will help to make sure that your lungs are clear. If you have an incision on your chest or abdomen from surgery, place a pillow or a rolled-up towel firmly against the incision when you cough. This can help to reduce pain while taking deep breaths and coughing. General tips When you are able to get out of bed: Walk around often. Continue to take deep breaths and cough in order to clear your lungs. Keep using the incentive spirometer until your health care provider says it is okay to stop using it. If you have been in the hospital, you may be told to keep using the spirometer at home. Contact a health care provider if: You are having difficulty using the spirometer. You have trouble using the spirometer as often as instructed. Your pain medicine is not giving enough relief for you to use the spirometer as told. You  have a fever. Get help right away if: You develop shortness of breath. You develop a cough with bloody mucus from the lungs. You have fluid or blood coming from an incision site after you cough. Summary An incentive spirometer is a tool that can help you learn to take long, deep breaths to keep your lungs clear and active. You may be asked to use a spirometer after a surgery, if  you have a lung problem or a history of smoking, or if you have been inactive for a long period of time. Use your incentive spirometer as instructed every 1-2 hours while you are awake. If you have an incision on your chest or abdomen, place a pillow or a rolled-up towel firmly against your incision when you cough. This will help to reduce pain. Get help right away if you have shortness of breath, you cough up bloody mucus, or blood comes from your incision when you cough. This information is not intended to replace advice given to you by your health care provider. Make sure you discuss any questions you have with your health care provider. Document Revised: 07/07/2019 Document Reviewed: 07/07/2019 Elsevier Patient Education  2023 [employer]

## 2022-08-11 ENCOUNTER — Ambulatory Visit
Admission: RE | Admit: 2022-08-11 | Discharge: 2022-08-11 | Disposition: A | Discharge status: Home / Self Care | Payer: Managed Care, Other (non HMO) | Attending: Podiatry | Admitting: Podiatry

## 2022-08-11 ENCOUNTER — Ambulatory Visit: Payer: Managed Care, Other (non HMO)

## 2022-08-11 ENCOUNTER — Ambulatory Visit: Payer: Managed Care, Other (non HMO) | Admitting: Anesthesiology

## 2022-08-11 ENCOUNTER — Other Ambulatory Visit: Payer: Self-pay

## 2022-08-11 ENCOUNTER — Ambulatory Visit: Payer: Managed Care, Other (non HMO) | Admitting: Urgent Care

## 2022-08-11 ENCOUNTER — Encounter: Payer: Self-pay | Admitting: Podiatry

## 2022-08-11 ENCOUNTER — Encounter
Admission: RE | Disposition: A | Discharge status: Home / Self Care | Payer: Self-pay | Source: Home / Self Care | Attending: Podiatry

## 2022-08-11 DIAGNOSIS — Z89411 Acquired absence of right great toe: Secondary | ICD-10-CM | POA: Diagnosis not present

## 2022-08-11 DIAGNOSIS — Z951 Presence of aortocoronary bypass graft: Secondary | ICD-10-CM | POA: Insufficient documentation

## 2022-08-11 DIAGNOSIS — N189 Chronic kidney disease, unspecified: Secondary | ICD-10-CM | POA: Insufficient documentation

## 2022-08-11 DIAGNOSIS — I129 Hypertensive chronic kidney disease with stage 1 through stage 4 chronic kidney disease, or unspecified chronic kidney disease: Secondary | ICD-10-CM | POA: Diagnosis not present

## 2022-08-11 DIAGNOSIS — Z794 Long term (current) use of insulin: Secondary | ICD-10-CM | POA: Insufficient documentation

## 2022-08-11 DIAGNOSIS — I2510 Atherosclerotic heart disease of native coronary artery without angina pectoris: Secondary | ICD-10-CM | POA: Diagnosis not present

## 2022-08-11 DIAGNOSIS — E1122 Type 2 diabetes mellitus with diabetic chronic kidney disease: Secondary | ICD-10-CM | POA: Insufficient documentation

## 2022-08-11 DIAGNOSIS — Z7985 Long-term (current) use of injectable non-insulin antidiabetic drugs: Secondary | ICD-10-CM | POA: Diagnosis not present

## 2022-08-11 DIAGNOSIS — Z01812 Encounter for preprocedural laboratory examination: Secondary | ICD-10-CM

## 2022-08-11 DIAGNOSIS — E785 Hyperlipidemia, unspecified: Secondary | ICD-10-CM | POA: Insufficient documentation

## 2022-08-11 DIAGNOSIS — E1152 Type 2 diabetes mellitus with diabetic peripheral angiopathy with gangrene: Secondary | ICD-10-CM | POA: Insufficient documentation

## 2022-08-11 DIAGNOSIS — E1142 Type 2 diabetes mellitus with diabetic polyneuropathy: Secondary | ICD-10-CM | POA: Insufficient documentation

## 2022-08-11 DIAGNOSIS — F17210 Nicotine dependence, cigarettes, uncomplicated: Secondary | ICD-10-CM | POA: Diagnosis not present

## 2022-08-11 DIAGNOSIS — Z7984 Long term (current) use of oral hypoglycemic drugs: Secondary | ICD-10-CM | POA: Insufficient documentation

## 2022-08-11 DIAGNOSIS — M86672 Other chronic osteomyelitis, left ankle and foot: Secondary | ICD-10-CM | POA: Insufficient documentation

## 2022-08-11 DIAGNOSIS — T25322A Burn of third degree of left foot, initial encounter: Secondary | ICD-10-CM | POA: Diagnosis present

## 2022-08-11 DIAGNOSIS — M86172 Other acute osteomyelitis, left ankle and foot: Secondary | ICD-10-CM | POA: Insufficient documentation

## 2022-08-11 HISTORY — PX: AMPUTATION TOE: SHX6595

## 2022-08-11 LAB — GLUCOSE, CAPILLARY
Glucose-Capillary: 258 mg/dL — ABNORMAL HIGH (ref 70–99)
Glucose-Capillary: 210 mg/dL — ABNORMAL HIGH (ref 70–99)

## 2022-08-11 SURGERY — AMPUTATION, TOE
Anesthesia: General | Site: Toe | Laterality: Left

## 2022-08-11 MED ORDER — 0.9 % SODIUM CHLORIDE (POUR BTL) OPTIME
TOPICAL | Status: DC | PRN
  Administered 2022-08-11: 1000 mL

## 2022-08-11 MED ORDER — INSULIN ASPART 100 UNIT/ML IJ SOLN
6.0000 [IU] | Freq: Once | INTRAMUSCULAR | Status: AC
  Administered 2022-08-11: 6 [IU] via SUBCUTANEOUS

## 2022-08-11 MED ORDER — PROPOFOL 500 MG/50ML IV EMUL
INTRAVENOUS | Status: DC | PRN
  Administered 2022-08-11: 75 ug/kg/min via INTRAVENOUS

## 2022-08-11 MED ORDER — ACETAMINOPHEN 10 MG/ML IV SOLN
INTRAVENOUS | Status: AC
  Filled 2022-08-11: qty 100

## 2022-08-11 MED ORDER — SODIUM CHLORIDE 0.9 % IV SOLN: INTRAVENOUS | Status: DC

## 2022-08-11 MED ORDER — LIDOCAINE-EPINEPHRINE 1 %-1:100000 IJ SOLN
INTRAMUSCULAR | Status: AC
  Filled 2022-08-11: qty 1

## 2022-08-11 MED ORDER — CEFAZOLIN SODIUM-DEXTROSE 2-4 GM/100ML-% IV SOLN
INTRAVENOUS | Status: AC
  Filled 2022-08-11: qty 100

## 2022-08-11 MED ORDER — FAMOTIDINE 20 MG PO TABS
20.0000 mg | ORAL_TABLET | Freq: Once | ORAL | Status: AC
  Administered 2022-08-11: 20 mg via ORAL

## 2022-08-11 MED ORDER — FENTANYL CITRATE (PF) 100 MCG/2ML IJ SOLN
INTRAMUSCULAR | Status: AC
  Filled 2022-08-11: qty 2

## 2022-08-11 MED ORDER — BUPIVACAINE HCL 0.5 % IJ SOLN
INTRAMUSCULAR | Status: DC | PRN
  Administered 2022-08-11: 10 mL

## 2022-08-11 MED ORDER — CEFAZOLIN SODIUM-DEXTROSE 2-4 GM/100ML-% IV SOLN
2.0000 g | INTRAVENOUS | Status: AC
  Administered 2022-08-11: 2 g via INTRAVENOUS

## 2022-08-11 MED ORDER — FAMOTIDINE 20 MG PO TABS
ORAL_TABLET | ORAL | Status: AC
  Filled 2022-08-11: qty 1

## 2022-08-11 MED ORDER — PROPOFOL 10 MG/ML IV BOLUS
INTRAVENOUS | Status: DC | PRN
  Administered 2022-08-11: 40 mg via INTRAVENOUS

## 2022-08-11 MED ORDER — OXYCODONE HCL 5 MG/5ML PO SOLN: 5.0000 mg | Freq: Once | ORAL | Status: DC | PRN

## 2022-08-11 MED ORDER — ACETAMINOPHEN 10 MG/ML IV SOLN
INTRAVENOUS | Status: DC | PRN
  Administered 2022-08-11: 1000 mg via INTRAVENOUS

## 2022-08-11 MED ORDER — FENTANYL CITRATE (PF) 100 MCG/2ML IJ SOLN
INTRAMUSCULAR | Status: DC | PRN
  Administered 2022-08-11: 50 ug via INTRAVENOUS

## 2022-08-11 MED ORDER — INSULIN ASPART 100 UNIT/ML IJ SOLN
INTRAMUSCULAR | Status: AC
  Filled 2022-08-11: qty 1

## 2022-08-11 MED ORDER — CHLORHEXIDINE GLUCONATE 0.12 % MT SOLN
15.0000 mL | Freq: Once | OROMUCOSAL | Status: AC
  Administered 2022-08-11: 15 mL via OROMUCOSAL

## 2022-08-11 MED ORDER — BUPIVACAINE HCL (PF) 0.5 % IJ SOLN
INTRAMUSCULAR | Status: AC
  Filled 2022-08-11: qty 30

## 2022-08-11 MED ORDER — FAMOTIDINE 20 MG PO TABS: 20.0000 mg | ORAL_TABLET | Freq: Once | ORAL | Status: DC

## 2022-08-11 MED ORDER — CHLORHEXIDINE GLUCONATE 0.12 % MT SOLN
OROMUCOSAL | Status: AC
  Filled 2022-08-11: qty 15

## 2022-08-11 MED ORDER — OXYCODONE HCL 5 MG PO TABS: 5.0000 mg | ORAL_TABLET | Freq: Once | ORAL | Status: DC | PRN

## 2022-08-11 MED ORDER — FENTANYL CITRATE (PF) 100 MCG/2ML IJ SOLN: 25.0000 ug | INTRAMUSCULAR | Status: DC | PRN

## 2022-08-11 MED ORDER — ORAL CARE MOUTH RINSE: 15.0000 mL | Freq: Once | OROMUCOSAL | Status: AC

## 2022-08-11 SURGICAL SUPPLY — 47 items
BLADE OSC/SAGITTAL MD 5.5X18 (BLADE) ×1 IMPLANT
BLADE SURG MINI STRL (BLADE) ×1 IMPLANT
BNDG CMPR 75X21 PLY HI ABS (MISCELLANEOUS) ×1
BNDG CMPR STD VLCR NS LF 5.8X4 (GAUZE/BANDAGES/DRESSINGS) ×1
BNDG ELASTIC 4X5.8 VLCR NS LF (GAUZE/BANDAGES/DRESSINGS) ×1 IMPLANT
BNDG ESMARCH 4 X 12 STRL LF (GAUZE/BANDAGES/DRESSINGS) ×1
BNDG ESMARCH 4X12 STRL LF (GAUZE/BANDAGES/DRESSINGS) ×1 IMPLANT
BNDG GAUZE DERMACEA FLUFF 4 (GAUZE/BANDAGES/DRESSINGS) ×1 IMPLANT
BNDG GZE 12X3 1 PLY HI ABS (GAUZE/BANDAGES/DRESSINGS) ×1
BNDG GZE DERMACEA 4 6PLY (GAUZE/BANDAGES/DRESSINGS) ×1
BNDG STRETCH GAUZE 3IN X12FT (GAUZE/BANDAGES/DRESSINGS) ×2 IMPLANT
DRAPE FLUOR MINI C-ARM 54X84 (DRAPES) ×1 IMPLANT
DRAPE XRAY CASSETTE 23X24 (DRAPES) ×1 IMPLANT
DURAPREP 26ML APPLICATOR (WOUND CARE) ×1 IMPLANT
ELECT REM PT RETURN 9FT ADLT (ELECTROSURGICAL) ×1
ELECTRODE REM PT RTRN 9FT ADLT (ELECTROSURGICAL) ×1 IMPLANT
GAUZE PACKING IODOFORM 1/2INX (GAUZE/BANDAGES/DRESSINGS) ×1 IMPLANT
GAUZE SPONGE 4X4 12PLY STRL (GAUZE/BANDAGES/DRESSINGS) ×1 IMPLANT
GAUZE STRETCH 2X75IN STRL (MISCELLANEOUS) ×1 IMPLANT
GAUZE XEROFORM 1X8 LF (GAUZE/BANDAGES/DRESSINGS) ×1 IMPLANT
GLOVE BIO SURGEON STRL SZ7.5 (GLOVE) ×1 IMPLANT
GLOVE INDICATOR 8.0 STRL GRN (GLOVE) ×1 IMPLANT
GOWN STRL REUS W/ TWL XL LVL3 (GOWN DISPOSABLE) ×2 IMPLANT
GOWN STRL REUS W/TWL XL LVL3 (GOWN DISPOSABLE) ×2
IV NS IRRIG 3000ML ARTHROMATIC (IV SOLUTION) ×1 IMPLANT
KIT TURNOVER KIT A (KITS) ×1 IMPLANT
LABEL OR SOLS (LABEL) ×1 IMPLANT
MANIFOLD NEPTUNE II (INSTRUMENTS) ×1 IMPLANT
NDL FILTER BLUNT 18X1 1/2 (NEEDLE) ×1 IMPLANT
NDL HYPO 25X1 1.5 SAFETY (NEEDLE) ×1 IMPLANT
NEEDLE FILTER BLUNT 18X1 1/2 (NEEDLE) ×1 IMPLANT
NEEDLE HYPO 25X1 1.5 SAFETY (NEEDLE) ×1 IMPLANT
NS IRRIG 500ML POUR BTL (IV SOLUTION) ×1 IMPLANT
PACK EXTREMITY ARMC (MISCELLANEOUS) ×1 IMPLANT
PAD ABD DERMACEA PRESS 5X9 (GAUZE/BANDAGES/DRESSINGS) ×2 IMPLANT
PULSAVAC PLUS IRRIG FAN TIP (DISPOSABLE) ×1
SHIELD FULL FACE ANTIFOG 7M (MISCELLANEOUS) ×1 IMPLANT
STOCKINETTE M/LG 89821 (MISCELLANEOUS) ×1 IMPLANT
STRAP SAFETY 5IN WIDE (MISCELLANEOUS) ×1 IMPLANT
SUT ETHILON 3-0 FS-10 30 BLK (SUTURE) ×2
SUT ETHILON 5-0 FS-2 18 BLK (SUTURE) ×1 IMPLANT
SUT VIC AB 4-0 FS2 27 (SUTURE) ×1 IMPLANT
SUTURE EHLN 3-0 FS-10 30 BLK (SUTURE) ×1 IMPLANT
SYR 10ML LL (SYRINGE) ×3 IMPLANT
TIP FAN IRRIG PULSAVAC PLUS (DISPOSABLE) ×1 IMPLANT
TRAP FLUID SMOKE EVACUATOR (MISCELLANEOUS) ×1 IMPLANT
WATER STERILE IRR 500ML POUR (IV SOLUTION) ×1 IMPLANT

## 2022-08-11 NOTE — Discharge Instructions (Addendum)
[facility]  POST OPERATIVE INSTRUCTIONS FOR DR. FOWLER AND [facility] PODIATRY DEPARTMENT   Take your medication as prescribed.  Pain medication should be taken only as needed.  Keep the dressing clean, dry and intact.  Keep your foot elevated above the heart level for the first 48 hours.  Walking to the bathroom and brief periods of walking are acceptable, unless we have instructed you to be non-weight bearing.  Always wear your post-op shoe when walking.  Always use your crutches if you are to be non-weight bearing.  Do not take a shower. Baths are permissible as long as the foot is kept out of the water.   Every hour you are awake:  Bend your knee 15 times. Flex foot 15 times Massage calf 15 times  Call [facility] ([phone]) if any of the following problems occur: You develop a temperature or fever. The bandage becomes saturated with blood. Medication does not stop your pain. Injury of the foot occurs. Any symptoms of infection including redness, odor, or red streaks running from wound.AMBULATORY SURGERY  DISCHARGE INSTRUCTIONS   The drugs that you were given will stay in your system until tomorrow so for the next 24 hours you should not:  Drive an automobile Make any legal decisions Drink any alcoholic beverage   You may resume regular meals tomorrow.  Today it is better to start with liquids and gradually work up to solid foods.  You may eat anything you prefer, but it is better to start with liquids, then soup and crackers, and gradually work up to solid foods.   Please notify your doctor immediately if you have any unusual bleeding, trouble breathing, redness and pain at the surgery site, drainage, fever, or pain not relieved by medication.    Additional Instructions:        Please contact your physician with any problems or Same Day Surgery at [phone], Monday through Friday 6 am  to 4 pm, or [facility] number at [phone]. 

## 2022-08-11 NOTE — Op Note (Signed)
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  Amputation left second toe MTPJ 2.  Amputation left third toe MTPJ 3.  Amputation left fourth toe partial 4.  Intraoperative fluoroscopy without assistance of radiologist    EBL: Minimal    Anesthesia:local and IV sedation local consisted of a total of 10 cc of 0.5% bupivacaine plain in a local block fashion    Hemostasis: None    Specimen: Gangrenous left second third and fourth toes    Complications: None    Operative indications:Andrew Harding is an 72 y.o. that presents today for surgical intervention.  The risks/benefits/alternatives/complications have been discussed and consent has been given.    Procedure:  Patient was brought into the OR and placed on the operating table in thesupine position. After anesthesia was obtained theleft lower extremity was prepped and draped in usual sterile fashion.  Attention was directed to the left forefoot where circumferential incisions were placed at the base of the second third and fourth toe.  Full-thickness flaps were created.  The second and third toes were disarticulated at the metatarsophalangeal joint.  The residual base of the proximal phalanx of the fourth toe was salvaged and an osteotomy was created.  The distal fourth toe was then removed.  The wound was flushed with copious amounts of irrigation.  Closure was then performed with a 3-0 nylon for the skin.    Patient tolerated the procedure and anesthesia well.  Was transported from the OR to the PACU with all vital signs stable and vascular status intact. To be discharged per routine protocol.  Will follow up in approximately 1 week in the outpatient clinic.

## 2022-08-11 NOTE — Anesthesia Postprocedure Evaluation (Signed)
Anesthesia Post Note  [name]  Procedure(s) Performed: AMPUTATION TOE 2, 3, 4 (Left: Toe)  Patient location during evaluation: PACU Anesthesia Type: General Level of consciousness: awake and alert Pain management: pain level controlled Vital Signs Assessment: post-procedure vital signs reviewed and stable Respiratory status: spontaneous breathing, nonlabored ventilation, respiratory function stable and patient connected to nasal cannula oxygen Cardiovascular status: blood pressure returned to baseline and stable Postop Assessment: no apparent nausea or vomiting Anesthetic complications: no   No notable events documented.   Last Vitals:  Vitals:   08/11/22 1205 08/11/22 1217  BP:  (!) 146/67  Pulse: 60   Resp: 14 18  Temp:  (!) 35.9 C  SpO2: 99% 99%    Last Pain:  Vitals:   08/11/22 1217  TempSrc: Temporal  PainSc: 0-No pain                 Cleda Mccreedy Curtistine Pettitt

## 2022-08-11 NOTE — Anesthesia Procedure Notes (Signed)
Procedure Name: General with mask airway Date/Time: 08/11/2022 9:31 AM  Performed by: Ginger Carne, CRNAPre-anesthesia Checklist: Patient identified, Emergency Drugs available, Suction available, Patient being monitored and Timeout performed Patient Re-evaluated:Patient Re-evaluated prior to induction Oxygen Delivery Method: Simple face mask Preoxygenation: Pre-oxygenation with 100% oxygen Induction Type: IV induction

## 2022-08-11 NOTE — Anesthesia Preprocedure Evaluation (Signed)
Anesthesia Evaluation  Patient identified by MRN, date of birth, ID band Patient awake    Reviewed: Allergy & Precautions, NPO status , Patient's Chart, lab work & pertinent test results  History of Anesthesia Complications Negative for: history of anesthetic complications  Airway Mallampati: III  TM Distance: <3 FB Neck ROM: full    Dental  (+) Chipped, Poor Dentition   Pulmonary neg shortness of breath, COPD, Current Smoker and Patient abstained from smoking.   Pulmonary exam normal        Cardiovascular Exercise Tolerance: Good hypertension, + angina  + CAD  Normal cardiovascular exam     Neuro/Psych  Neuromuscular disease  negative psych ROS   GI/Hepatic negative GI ROS, Neg liver ROS,neg GERD  ,,  Endo/Other  negative endocrine ROSdiabetes, Type 2    Renal/GU Renal disease  negative genitourinary   Musculoskeletal   Abdominal   Peds  Hematology negative hematology ROS (+)   Anesthesia Other Findings Past Medical History: No date: Bladder neck obstruction No date: Chronic kidney disease No date: Coronary artery disease     Comment:  a.) s/p 4v CABG in 2014 No date: Diabetes mellitus without complication No date: Diabetic neuropathy No date: Diabetic peripheral neuropathy No date: Diverticulosis No date: Gout No date: Heart murmur No date: Hypercholesteremia No date: Hyperlipidemia No date: Hypertension No date: Peripheral neuropathy 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 No date: Tubular adenoma No date: Vitamin D deficiency  Past Surgical History: 06/01/2015: AMPUTATION TOE; Right     Comment:  Procedure: AMPUTATION TOE;  Surgeon: Recardo Evangelist,               DPM;  Location: [facility] ORS;  Service: Podiatry;                Laterality: Right; No date: CATARACT EXTRACTION, BILATERAL 06/12/2022: CIRCUMCISION; N/A     Comment:  Procedure: CIRCUMCISION ADULT;  Surgeon: Vanna Scotland, MD;  Location:  [facility] ORS;  Service: Urology;                Laterality: N/A; 02/14/2016: COLONOSCOPY WITH PROPOFOL; N/A     Comment:  Procedure: COLONOSCOPY WITH PROPOFOL;  Surgeon: Christena Deem, MD;  Location: [facility] ENDOSCOPY;  Service:               Endoscopy;  Laterality: N/A; 01/07/2019: COLONOSCOPY WITH PROPOFOL; N/A     Comment:  Procedure: COLONOSCOPY WITH PROPOFOL;  Surgeon:               Christena Deem, MD;  Location: [facility] ENDOSCOPY;                Service: Endoscopy;  Laterality: N/A; 08/2012: CORONARY ARTERY BYPASS GRAFT; N/A 06/01/2015: EXCISION PARTIAL PHALANX; Right     Comment:  Procedure: EXCISION PARTIAL PHALANX /  BONE;  Surgeon:               Recardo Evangelist, DPM;  Location: [facility] ORS;  Service:               Podiatry;  Laterality: Right; 05/29/2016: FLEXIBLE SIGMOIDOSCOPY; N/A     Comment:  Procedure: FLEXIBLE SIGMOIDOSCOPY;  Surgeon: Christena Deem, MD;  Location: [facility] ENDOSCOPY;  Service:  Endoscopy;  Laterality: N/A; No date: KNEE ARTHROSCOPY; Left     Reproductive/Obstetrics negative OB ROS                             Anesthesia Physical Anesthesia Plan  ASA: 3  Anesthesia Plan: General   Post-op Pain Management:    Induction: Intravenous  PONV Risk Score and Plan: Propofol infusion and TIVA  Airway Management Planned: Natural Airway and Nasal Cannula  Additional Equipment:   Intra-op Plan:   Post-operative Plan:   Informed Consent: I have reviewed the patients History and Physical, chart, labs and discussed the procedure including the risks, benefits and alternatives for the proposed anesthesia with the patient or authorized representative who has indicated his/her understanding and acceptance.     Dental Advisory Given  Plan Discussed with: Anesthesiologist, CRNA and Surgeon  Anesthesia Plan Comments: (Patient consented for risks of anesthesia including but not limited to:  -  adverse reactions to medications - risk of airway placement if required - damage to eyes, teeth, lips or other oral mucosa - nerve damage due to positioning  - sore throat or hoarseness - Damage to heart, brain, nerves, lungs, other parts of body or loss of life  Patient voiced understanding.)       Anesthesia Quick Evaluation
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
## 2022-08-11 NOTE — Transfer of Care (Signed)
Immediate Anesthesia Transfer of Care Note  [name]  Procedure(s) Performed: AMPUTATION TOE 2, 3, 4 (Left: Toe)  Patient Location: PACU  Anesthesia Type:General  Level of Consciousness: awake, alert , and sedated  Airway & Oxygen Therapy: Patient Spontanous Breathing and Patient connected to face mask oxygen  Post-op Assessment: Report given to RN and Post -op Vital signs reviewed and stable  Post vital signs: Reviewed and stable  Last Vitals:  Vitals Value Taken Time  BP 117/71 08/11/22 1120  Temp    Pulse 64 08/11/22 1122  Resp 16 08/11/22 1122  SpO2 100 % 08/11/22 1122  Vitals shown include unvalidated device data.  Last Pain:  Vitals:   08/11/22 0842  TempSrc: Oral  PainSc: 0-No pain         Complications: No notable events documented.

## 2022-08-12 ENCOUNTER — Encounter: Payer: Self-pay | Admitting: Podiatry

## 2022-08-15 LAB — SURGICAL PATHOLOGY

## 2022-08-17 ENCOUNTER — Other Ambulatory Visit: Payer: Self-pay

## 2022-08-17 ENCOUNTER — Encounter: Payer: Self-pay | Admitting: Radiology

## 2022-08-17 ENCOUNTER — Emergency Department: Payer: Managed Care, Other (non HMO)

## 2022-08-17 ENCOUNTER — Inpatient Hospital Stay
Admission: EM | Admit: 2022-08-17 | Discharge: 2022-08-29 | DRG: 853 | Disposition: A | Discharge status: Rehabilitation Facility | Payer: Managed Care, Other (non HMO) | Attending: Internal Medicine | Admitting: Internal Medicine

## 2022-08-17 DIAGNOSIS — X16XXXA Contact with hot heating appliances, radiators and pipes, initial encounter: Secondary | ICD-10-CM | POA: Diagnosis present

## 2022-08-17 DIAGNOSIS — L03116 Cellulitis of left lower limb: Secondary | ICD-10-CM | POA: Diagnosis present

## 2022-08-17 DIAGNOSIS — E861 Hypovolemia: Secondary | ICD-10-CM

## 2022-08-17 DIAGNOSIS — R6520 Severe sepsis without septic shock: Secondary | ICD-10-CM | POA: Diagnosis present

## 2022-08-17 DIAGNOSIS — L02612 Cutaneous abscess of left foot: Secondary | ICD-10-CM | POA: Diagnosis not present

## 2022-08-17 DIAGNOSIS — N1832 Chronic kidney disease, stage 3b: Secondary | ICD-10-CM | POA: Diagnosis not present

## 2022-08-17 DIAGNOSIS — E119 Type 2 diabetes mellitus without complications: Secondary | ICD-10-CM | POA: Diagnosis not present

## 2022-08-17 DIAGNOSIS — T25322A Burn of third degree of left foot, initial encounter: Secondary | ICD-10-CM | POA: Diagnosis present

## 2022-08-17 DIAGNOSIS — I4891 Unspecified atrial fibrillation: Secondary | ICD-10-CM

## 2022-08-17 DIAGNOSIS — E1152 Type 2 diabetes mellitus with diabetic peripheral angiopathy with gangrene: Secondary | ICD-10-CM | POA: Diagnosis present

## 2022-08-17 DIAGNOSIS — A419 Sepsis, unspecified organism: Secondary | ICD-10-CM

## 2022-08-17 DIAGNOSIS — L0390 Cellulitis, unspecified: Secondary | ICD-10-CM

## 2022-08-17 DIAGNOSIS — N189 Chronic kidney disease, unspecified: Secondary | ICD-10-CM | POA: Diagnosis not present

## 2022-08-17 DIAGNOSIS — T8781 Dehiscence of amputation stump: Secondary | ICD-10-CM | POA: Diagnosis present

## 2022-08-17 DIAGNOSIS — M86172 Other acute osteomyelitis, left ankle and foot: Secondary | ICD-10-CM | POA: Diagnosis not present

## 2022-08-17 DIAGNOSIS — I9589 Other hypotension: Secondary | ICD-10-CM | POA: Diagnosis not present

## 2022-08-17 DIAGNOSIS — L97829 Non-pressure chronic ulcer of other part of left lower leg with unspecified severity: Secondary | ICD-10-CM | POA: Diagnosis present

## 2022-08-17 DIAGNOSIS — M86272 Subacute osteomyelitis, left ankle and foot: Secondary | ICD-10-CM | POA: Diagnosis not present

## 2022-08-17 DIAGNOSIS — Y838 Other surgical procedures as the cause of abnormal reaction of the patient, or of later complication, without mention of misadventure at the time of the procedure: Secondary | ICD-10-CM | POA: Diagnosis present

## 2022-08-17 DIAGNOSIS — E1165 Type 2 diabetes mellitus with hyperglycemia: Secondary | ICD-10-CM | POA: Diagnosis present

## 2022-08-17 DIAGNOSIS — R7401 Elevation of levels of liver transaminase levels: Secondary | ICD-10-CM | POA: Diagnosis not present

## 2022-08-17 DIAGNOSIS — Z833 Family history of diabetes mellitus: Secondary | ICD-10-CM

## 2022-08-17 DIAGNOSIS — Z79899 Other long term (current) drug therapy: Secondary | ICD-10-CM

## 2022-08-17 DIAGNOSIS — L03818 Cellulitis of other sites: Secondary | ICD-10-CM | POA: Diagnosis not present

## 2022-08-17 DIAGNOSIS — N179 Acute kidney failure, unspecified: Secondary | ICD-10-CM | POA: Diagnosis present

## 2022-08-17 DIAGNOSIS — E1122 Type 2 diabetes mellitus with diabetic chronic kidney disease: Secondary | ICD-10-CM

## 2022-08-17 DIAGNOSIS — D649 Anemia, unspecified: Secondary | ICD-10-CM | POA: Diagnosis not present

## 2022-08-17 DIAGNOSIS — L089 Local infection of the skin and subcutaneous tissue, unspecified: Secondary | ICD-10-CM

## 2022-08-17 DIAGNOSIS — I2510 Atherosclerotic heart disease of native coronary artery without angina pectoris: Secondary | ICD-10-CM | POA: Diagnosis present

## 2022-08-17 DIAGNOSIS — T148XXA Other injury of unspecified body region, initial encounter: Secondary | ICD-10-CM | POA: Diagnosis not present

## 2022-08-17 DIAGNOSIS — A480 Gas gangrene: Secondary | ICD-10-CM | POA: Diagnosis present

## 2022-08-17 DIAGNOSIS — Z8249 Family history of ischemic heart disease and other diseases of the circulatory system: Secondary | ICD-10-CM

## 2022-08-17 DIAGNOSIS — Z794 Long term (current) use of insulin: Secondary | ICD-10-CM | POA: Diagnosis not present

## 2022-08-17 DIAGNOSIS — L97929 Non-pressure chronic ulcer of unspecified part of left lower leg with unspecified severity: Secondary | ICD-10-CM | POA: Diagnosis not present

## 2022-08-17 DIAGNOSIS — E1142 Type 2 diabetes mellitus with diabetic polyneuropathy: Secondary | ICD-10-CM | POA: Diagnosis present

## 2022-08-17 DIAGNOSIS — I480 Paroxysmal atrial fibrillation: Secondary | ICD-10-CM | POA: Diagnosis present

## 2022-08-17 DIAGNOSIS — G7281 Critical illness myopathy: Secondary | ICD-10-CM | POA: Diagnosis not present

## 2022-08-17 DIAGNOSIS — D509 Iron deficiency anemia, unspecified: Secondary | ICD-10-CM | POA: Diagnosis present

## 2022-08-17 DIAGNOSIS — I700 Atherosclerosis of aorta: Secondary | ICD-10-CM | POA: Diagnosis not present

## 2022-08-17 DIAGNOSIS — I10 Essential (primary) hypertension: Secondary | ICD-10-CM | POA: Diagnosis not present

## 2022-08-17 DIAGNOSIS — A4181 Sepsis due to Enterococcus: Secondary | ICD-10-CM | POA: Diagnosis present

## 2022-08-17 DIAGNOSIS — R531 Weakness: Secondary | ICD-10-CM | POA: Diagnosis present

## 2022-08-17 DIAGNOSIS — I70249 Atherosclerosis of native arteries of left leg with ulceration of unspecified site: Secondary | ICD-10-CM | POA: Diagnosis not present

## 2022-08-17 DIAGNOSIS — R6521 Severe sepsis with septic shock: Secondary | ICD-10-CM | POA: Diagnosis not present

## 2022-08-17 DIAGNOSIS — Z7982 Long term (current) use of aspirin: Secondary | ICD-10-CM

## 2022-08-17 DIAGNOSIS — Z7984 Long term (current) use of oral hypoglycemic drugs: Secondary | ICD-10-CM

## 2022-08-17 DIAGNOSIS — F17290 Nicotine dependence, other tobacco product, uncomplicated: Secondary | ICD-10-CM | POA: Diagnosis present

## 2022-08-17 DIAGNOSIS — R7989 Other specified abnormal findings of blood chemistry: Secondary | ICD-10-CM | POA: Diagnosis not present

## 2022-08-17 DIAGNOSIS — T8189XS Other complications of procedures, not elsewhere classified, sequela: Secondary | ICD-10-CM | POA: Diagnosis not present

## 2022-08-17 DIAGNOSIS — E559 Vitamin D deficiency, unspecified: Secondary | ICD-10-CM | POA: Diagnosis present

## 2022-08-17 DIAGNOSIS — A400 Sepsis due to streptococcus, group A: Secondary | ICD-10-CM | POA: Diagnosis not present

## 2022-08-17 DIAGNOSIS — I70248 Atherosclerosis of native arteries of left leg with ulceration of other part of lower left leg: Secondary | ICD-10-CM | POA: Diagnosis present

## 2022-08-17 DIAGNOSIS — Z89432 Acquired absence of left foot: Secondary | ICD-10-CM | POA: Diagnosis not present

## 2022-08-17 DIAGNOSIS — I129 Hypertensive chronic kidney disease with stage 1 through stage 4 chronic kidney disease, or unspecified chronic kidney disease: Secondary | ICD-10-CM | POA: Diagnosis present

## 2022-08-17 DIAGNOSIS — B952 Enterococcus as the cause of diseases classified elsewhere: Secondary | ICD-10-CM | POA: Diagnosis not present

## 2022-08-17 DIAGNOSIS — M8618 Other acute osteomyelitis, other site: Secondary | ICD-10-CM | POA: Diagnosis not present

## 2022-08-17 DIAGNOSIS — E11628 Type 2 diabetes mellitus with other skin complications: Secondary | ICD-10-CM | POA: Diagnosis present

## 2022-08-17 DIAGNOSIS — M109 Gout, unspecified: Secondary | ICD-10-CM | POA: Diagnosis present

## 2022-08-17 DIAGNOSIS — E7800 Pure hypercholesterolemia, unspecified: Secondary | ICD-10-CM | POA: Diagnosis present

## 2022-08-17 DIAGNOSIS — Z951 Presence of aortocoronary bypass graft: Secondary | ICD-10-CM

## 2022-08-17 DIAGNOSIS — E1169 Type 2 diabetes mellitus with other specified complication: Secondary | ICD-10-CM | POA: Diagnosis present

## 2022-08-17 DIAGNOSIS — Z7985 Long-term (current) use of injectable non-insulin antidiabetic drugs: Secondary | ICD-10-CM

## 2022-08-17 DIAGNOSIS — N183 Chronic kidney disease, stage 3 unspecified: Secondary | ICD-10-CM | POA: Diagnosis not present

## 2022-08-17 HISTORY — DX: Cellulitis, unspecified: L03.90

## 2022-08-17 HISTORY — DX: Hypovolemia: E86.1

## 2022-08-17 HISTORY — DX: Sepsis, unspecified organism: A41.9

## 2022-08-17 LAB — COMPREHENSIVE METABOLIC PANEL
ALT: 181 U/L — ABNORMAL HIGH (ref 0–44)
AST: 244 U/L — ABNORMAL HIGH (ref 15–41)
Albumin: 3.3 g/dL — ABNORMAL LOW (ref 3.5–5.0)
Alkaline Phosphatase: 132 U/L — ABNORMAL HIGH (ref 38–126)
Anion gap: 11 (ref 5–15)
BUN: 29 mg/dL — ABNORMAL HIGH (ref 8–23)
CO2: 24 mmol/L (ref 22–32)
Calcium: 9 mg/dL (ref 8.9–10.3)
Chloride: 100 mmol/L (ref 98–111)
Creatinine, Ser: 2.02 mg/dL — ABNORMAL HIGH (ref 0.61–1.24)
GFR, Estimated: 34 mL/min — ABNORMAL LOW (ref >60)
Glucose, Bld: 146 mg/dL — ABNORMAL HIGH (ref 70–99)
Potassium: 3.4 mmol/L — ABNORMAL LOW (ref 3.5–5.1)
Sodium: 135 mmol/L (ref 135–145)
Total Bilirubin: 0.9 mg/dL (ref 0.3–1.2)
Total Protein: 8.1 g/dL (ref 6.5–8.1)

## 2022-08-17 LAB — URINALYSIS, ROUTINE W REFLEX MICROSCOPIC
Bilirubin Urine: NEGATIVE
Glucose, UA: 50 mg/dL — AB
Ketones, ur: NEGATIVE mg/dL
Leukocytes,Ua: NEGATIVE
Nitrite: NEGATIVE
Protein, ur: 30 mg/dL — AB
Specific Gravity, Urine: 1.041 — ABNORMAL HIGH (ref 1.005–1.030)
pH: 5 (ref 5.0–8.0)

## 2022-08-17 LAB — LACTIC ACID, PLASMA
Lactic Acid, Venous: 1.7 mmol/L (ref 0.5–1.9)
Lactic Acid, Venous: 1.8 mmol/L (ref 0.5–1.9)

## 2022-08-17 LAB — CBC WITH DIFFERENTIAL/PLATELET
Abs Immature Granulocytes: 0.11 10*3/uL — ABNORMAL HIGH (ref 0.00–0.07)
Basophils Absolute: 0 10*3/uL (ref 0.0–0.1)
Basophils Relative: 0 %
Eosinophils Absolute: 0 10*3/uL (ref 0.0–0.5)
Eosinophils Relative: 0 %
HCT: 34.8 % — ABNORMAL LOW (ref 39.0–52.0)
Hemoglobin: 11.4 g/dL — ABNORMAL LOW (ref 13.0–17.0)
Immature Granulocytes: 1 %
Lymphocytes Relative: 4 %
Lymphs Abs: 0.7 10*3/uL (ref 0.7–4.0)
MCH: 30.6 pg (ref 26.0–34.0)
MCHC: 32.8 g/dL (ref 30.0–36.0)
MCV: 93.5 fL (ref 80.0–100.0)
Monocytes Absolute: 0.7 10*3/uL (ref 0.1–1.0)
Monocytes Relative: 5 %
Neutro Abs: 14.3 10*3/uL — ABNORMAL HIGH (ref 1.7–7.7)
Neutrophils Relative %: 90 %
Platelets: 372 10*3/uL (ref 150–400)
RBC: 3.72 MIL/uL — ABNORMAL LOW (ref 4.22–5.81)
RDW: 12.5 % (ref 11.5–15.5)
WBC: 15.9 10*3/uL — ABNORMAL HIGH (ref 4.0–10.5)
nRBC: 0 % (ref 0.0–0.2)

## 2022-08-17 LAB — CBG MONITORING, ED: Glucose-Capillary: 166 mg/dL — ABNORMAL HIGH (ref 70–99)

## 2022-08-17 LAB — CK: Total CK: 43 U/L — ABNORMAL LOW (ref 49–397)

## 2022-08-17 LAB — MAGNESIUM: Magnesium: 1.3 mg/dL — ABNORMAL LOW (ref 1.7–2.4)

## 2022-08-17 LAB — TROPONIN I (HIGH SENSITIVITY)
Troponin I (High Sensitivity): 15 ng/L (ref <18)
Troponin I (High Sensitivity): 13 ng/L (ref <18)

## 2022-08-17 LAB — PROTIME-INR
INR: 1.2 (ref 0.8–1.2)
Prothrombin Time: 15.4 seconds — ABNORMAL HIGH (ref 11.4–15.2)

## 2022-08-17 LAB — HEMOGLOBIN A1C
Hgb A1c MFr Bld: 6.9 % — ABNORMAL HIGH (ref 4.8–5.6)
Mean Plasma Glucose: 151.33 mg/dL

## 2022-08-17 LAB — T4, FREE: Free T4: 0.68 ng/dL (ref 0.61–1.12)

## 2022-08-17 LAB — BRAIN NATRIURETIC PEPTIDE: B Natriuretic Peptide: 137.3 pg/mL — ABNORMAL HIGH (ref 0.0–100.0)

## 2022-08-17 LAB — TSH: TSH: 0.894 u[IU]/mL (ref 0.350–4.500)

## 2022-08-17 LAB — GLUCOSE, CAPILLARY: Glucose-Capillary: 217 mg/dL — ABNORMAL HIGH (ref 70–99)

## 2022-08-17 MED ORDER — SODIUM CHLORIDE 0.9 % IV BOLUS
1000.0000 mL | Freq: Once | INTRAVENOUS | Status: AC
  Administered 2022-08-17: 1000 mL via INTRAVENOUS

## 2022-08-17 MED ORDER — VANCOMYCIN HCL IN DEXTROSE 1-5 GM/200ML-% IV SOLN
1000.0000 mg | Freq: Once | INTRAVENOUS | Status: AC
  Administered 2022-08-17: 1000 mg via INTRAVENOUS
  Filled 2022-08-17: qty 200

## 2022-08-17 MED ORDER — INSULIN GLARGINE-YFGN 100 UNIT/ML ~~LOC~~ SOLN
45.0000 [IU] | Freq: Every day | SUBCUTANEOUS | Status: DC
  Administered 2022-08-17: 45 [IU] via SUBCUTANEOUS
  Filled 2022-08-17: qty 0.45

## 2022-08-17 MED ORDER — INSULIN ASPART 100 UNIT/ML IJ SOLN
0.0000 [IU] | Freq: Every day | INTRAMUSCULAR | Status: DC
  Administered 2022-08-17: 2 [IU] via SUBCUTANEOUS
  Administered 2022-08-18: 3 [IU] via SUBCUTANEOUS
  Administered 2022-08-26 – 2022-08-28 (×3): 2 [IU] via SUBCUTANEOUS
  Filled 2022-08-17 (×5): qty 1

## 2022-08-17 MED ORDER — ACETAMINOPHEN 650 MG RE SUPP: 650.0000 mg | Freq: Four times a day (QID) | RECTAL | Status: DC | PRN

## 2022-08-17 MED ORDER — IOHEXOL 350 MG/ML SOLN
60.0000 mL | Freq: Once | INTRAVENOUS | Status: AC | PRN
  Administered 2022-08-17: 60 mL via INTRAVENOUS

## 2022-08-17 MED ORDER — VITAMIN D 25 MCG (1000 UNIT) PO TABS
2000.0000 [IU] | ORAL_TABLET | Freq: Every day | ORAL | Status: DC
  Administered 2022-08-18 – 2022-08-29 (×10): 2000 [IU] via ORAL
  Filled 2022-08-17 (×11): qty 2

## 2022-08-17 MED ORDER — ONDANSETRON HCL 4 MG/2ML IJ SOLN: 4.0000 mg | Freq: Four times a day (QID) | INTRAMUSCULAR | Status: DC | PRN

## 2022-08-17 MED ORDER — SODIUM CHLORIDE 0.9 % IV SOLN
2.0000 g | Freq: Once | INTRAVENOUS | Status: AC
  Administered 2022-08-17: 2 g via INTRAVENOUS
  Filled 2022-08-17: qty 20

## 2022-08-17 MED ORDER — SODIUM CHLORIDE 0.9 % IV SOLN
2.0000 g | INTRAVENOUS | Status: DC
  Filled 2022-08-17: qty 20

## 2022-08-17 MED ORDER — INSULIN ASPART 100 UNIT/ML IJ SOLN
0.0000 [IU] | Freq: Three times a day (TID) | INTRAMUSCULAR | Status: DC
  Administered 2022-08-17: 2 [IU] via SUBCUTANEOUS
  Administered 2022-08-18 (×3): 3 [IU] via SUBCUTANEOUS
  Administered 2022-08-19: 2 [IU] via SUBCUTANEOUS
  Administered 2022-08-21: 1 [IU] via SUBCUTANEOUS
  Administered 2022-08-24: 3 [IU] via SUBCUTANEOUS
  Administered 2022-08-24: 2 [IU] via SUBCUTANEOUS
  Administered 2022-08-25: 3 [IU] via SUBCUTANEOUS
  Administered 2022-08-25 – 2022-08-26 (×3): 2 [IU] via SUBCUTANEOUS
  Administered 2022-08-27: 3 [IU] via SUBCUTANEOUS
  Administered 2022-08-28: 5 [IU] via SUBCUTANEOUS
  Administered 2022-08-28 – 2022-08-29 (×3): 2 [IU] via SUBCUTANEOUS
  Administered 2022-08-29: 1 [IU] via SUBCUTANEOUS
  Filled 2022-08-17 (×20): qty 1

## 2022-08-17 MED ORDER — TRAZODONE HCL 50 MG PO TABS
25.0000 mg | ORAL_TABLET | Freq: Every evening | ORAL | Status: DC | PRN
  Administered 2022-08-17 – 2022-08-27 (×7): 25 mg via ORAL
  Filled 2022-08-17 (×7): qty 1

## 2022-08-17 MED ORDER — ACETAMINOPHEN 325 MG PO TABS
650.0000 mg | ORAL_TABLET | Freq: Four times a day (QID) | ORAL | Status: DC | PRN
  Administered 2022-08-18 – 2022-08-20 (×2): 650 mg via ORAL
  Filled 2022-08-17 (×2): qty 2

## 2022-08-17 MED ORDER — ASPIRIN 81 MG PO TBEC
81.0000 mg | DELAYED_RELEASE_TABLET | Freq: Every day | ORAL | Status: DC
  Administered 2022-08-18 – 2022-08-22 (×4): 81 mg via ORAL
  Filled 2022-08-17 (×5): qty 1

## 2022-08-17 MED ORDER — PRAVASTATIN SODIUM 20 MG PO TABS
80.0000 mg | ORAL_TABLET | Freq: Every day | ORAL | Status: DC
  Administered 2022-08-17 – 2022-08-21 (×4): 80 mg via ORAL
  Filled 2022-08-17 (×5): qty 4

## 2022-08-17 MED ORDER — MAGNESIUM SULFATE 2 GM/50ML IV SOLN
2.0000 g | Freq: Once | INTRAVENOUS | Status: AC
  Administered 2022-08-17: 2 g via INTRAVENOUS
  Filled 2022-08-17: qty 50

## 2022-08-17 MED ORDER — GABAPENTIN 300 MG PO CAPS
300.0000 mg | ORAL_CAPSULE | Freq: Three times a day (TID) | ORAL | Status: DC
  Administered 2022-08-17 – 2022-08-29 (×30): 300 mg via ORAL
  Filled 2022-08-17 (×32): qty 1

## 2022-08-17 MED ORDER — BISACODYL 10 MG RE SUPP: 10.0000 mg | Freq: Every day | RECTAL | Status: DC | PRN

## 2022-08-17 MED ORDER — DOCUSATE SODIUM 100 MG PO CAPS
100.0000 mg | ORAL_CAPSULE | Freq: Two times a day (BID) | ORAL | Status: DC
  Administered 2022-08-17 – 2022-08-29 (×19): 100 mg via ORAL
  Filled 2022-08-17 (×21): qty 1

## 2022-08-17 MED ORDER — ONDANSETRON HCL 4 MG PO TABS: 4.0000 mg | ORAL_TABLET | Freq: Four times a day (QID) | ORAL | Status: DC | PRN

## 2022-08-17 MED ORDER — ENOXAPARIN SODIUM 40 MG/0.4ML IJ SOSY
40.0000 mg | PREFILLED_SYRINGE | INTRAMUSCULAR | Status: DC
  Administered 2022-08-17: 40 mg via SUBCUTANEOUS
  Filled 2022-08-17: qty 0.4

## 2022-08-17 NOTE — H&P (Signed)
History and Physical    [name] ZOX:[id number] DOB: [birth year] DOA: 08/17/2022 DOS: the patient was seen and examined on 08/17/2022 PCP: Lynnea Ferrier, MD  Patient coming from:  Podiatry clinic  Chief Complaint:  Chief Complaint  Patient presents with   Wound Check   HPI: Andrew Harding is a 72 y.o. 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 Irene Limbo office after he went for wound check and noted upside was swollen and had erythema. Patient also had a syncopal episode couple days ago at home. He tells me he is not been his usual baseline and thinks he is not eating and drinking well.  When he came to the emergency room he was hypotensive mildly tachycardic. Received IV fluids per sepsis protocol and blood pressure is much better. Patient has been taking oralefdinir   denies any fever. rived after surgery. C given his presentation patient is being admitted with sepsis secondary to postop cellulitis left foot. Received dose of IV vancomycin and IV Rocephin along with IV fluids.   Review of Systems: As mentioned in the history of present illness. All other systems reviewed and are negative. Past Medical History:  Diagnosis Date   Bladder neck obstruction    Chronic kidney disease    Coronary artery disease    a.) s/p 4v CABG in 2014   Diabetes mellitus without complication    Diabetic neuropathy    Diabetic peripheral neuropathy    Diverticulosis    Gout    Heart murmur    Hypercholesteremia    Hyperlipidemia    Hypertension    Peripheral neuropathy    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   Tubular adenoma    Vitamin D deficiency    Past Surgical History:  Procedure Laterality Date   AMPUTATION TOE Right 06/01/2015   Procedure: AMPUTATION TOE;  Surgeon: Recardo Evangelist, DPM;  Location: [facility] ORS;  Service: Podiatry;  Laterality:  Right;   AMPUTATION TOE Left 08/11/2022   Procedure: AMPUTATION TOE 2, 3, 4;  Surgeon: [name], DPM;  Location: [facility];  Service: Podiatry;  Laterality: Left;   CATARACT EXTRACTION, BILATERAL     CIRCUMCISION N/A 06/12/2022   Procedure: CIRCUMCISION ADULT;  Surgeon: Vanna Scotland, MD;  Location: [facility];  Service: Urology;  Laterality: N/A;   COLONOSCOPY WITH PROPOFOL N/A 02/14/2016   Procedure: COLONOSCOPY WITH PROPOFOL;  Surgeon: Christena Deem, MD;  Location: [facility] ENDOSCOPY;  Service: Endoscopy;  Laterality: N/A;   COLONOSCOPY WITH PROPOFOL N/A 01/07/2019   Procedure: COLONOSCOPY WITH PROPOFOL;  Surgeon: Christena Deem, MD;  Location: [facility] ENDOSCOPY;  Service: Endoscopy;  Laterality: N/A;   CORONARY ARTERY BYPASS GRAFT N/A 08/2012   EXCISION PARTIAL PHALANX Right 06/01/2015   Procedure: EXCISION PARTIAL PHALANX /  BONE;  Surgeon: Recardo Evangelist, DPM;  Location: [facility] ORS;  Service: Podiatry;  Laterality: Right;   FLEXIBLE SIGMOIDOSCOPY N/A 05/29/2016   Procedure: FLEXIBLE SIGMOIDOSCOPY;  Surgeon: Christena Deem, MD;  Location: [facility] ENDOSCOPY;  Service: Endoscopy;  Laterality: N/A;   KNEE ARTHROSCOPY Left    Social History:  reports that he has been smoking cigars. He has never used smokeless tobacco. He reports that he does not drink alcohol and does not use drugs.  No Known Allergies  Family History  Problem Relation Age of Onset   Diabetes Mellitus II Mother    CAD Mother  Prior to Admission medications   Medication Sig Start Date End Date Taking? Authorizing Provider  aspirin EC 81 MG tablet Take 81 mg by mouth daily.    [provider]  betamethasone dipropionate 0.05 % cream Apply topically 2 (two) times daily. 07/11/22   Michiel Cowboy A, PA-C  cefdinir (OMNICEF) 300 MG capsule Take 300 mg by mouth 2 (two) times daily. 08/08/22 08/18/22  [provider]  cholecalciferol (VITAMIN D) 1000 units tablet Take 2,000 Units by mouth daily.     [provider]  gabapentin (NEURONTIN) 300 MG capsule Take 300 mg by mouth 3 (three) times daily.    [provider]  glucose blood (ONETOUCH ULTRA) test strip Use 3 (three) times daily 09/09/18   [provider]  Insulin Glargine (BASAGLAR KWIKPEN) 100 UNIT/ML Inject into the skin daily. 15 -units in am, 18 units lunch, 20 supper    [provider]  insulin glargine (LANTUS) 100 UNIT/ML injection Inject 52 Units into the skin at bedtime.    [provider]  Insulin Pen Needle (FIFTY50 PEN NEEDLES) 32G X 4 MM MISC USE 4 TIMES DAILY 07/29/18   [provider]  lisinopril (ZESTRIL) 5 MG tablet TAKE ONE TABLET BY MOUTH EVERY DAY FOR BLOOD PRESSURE 07/25/18   [provider]  metFORMIN (GLUCOPHAGE-XR) 500 MG 24 hr tablet 500 mg 2 (two) times daily with a meal. 12/07/18   [provider]  oxyCODONE-acetaminophen (PERCOCET) 5-325 MG tablet Take 1-2 tablets by mouth every 4 (four) hours as needed for moderate pain or severe pain. 06/12/22   Vanna Scotland, MD  pravastatin (PRAVACHOL) 80 MG tablet TAKE ONE TABLET BY MOUTH AT NIGHT 07/08/18   [provider]  silver sulfADIAZINE (SILVADENE) 1 % cream Apply topically. 07/06/22   [provider]  traZODone (DESYREL) 50 MG tablet TAKE ONE TABLET AT BEDTIME AS NEEDED 10/21/18   [provider]  TRULICITY 1.5 MG/0.5ML SOPN  12/18/18   [provider]    Physical Exam: Vitals:   08/17/22 1200 08/17/22 1300 08/17/22 1400 08/17/22 1500  BP: 110/67 138/73 126/69 127/79  Pulse: 85 89 83 79  Resp: 19 13 (!) 23 15  Temp:      TempSrc:      SpO2: 98% 97% 100% 99%  Weight:      Height:       Physical Exam Constitutional:      Appearance: Normal appearance.  HENT:     Head: Normocephalic and atraumatic.  Eyes:     Extraocular Movements: Extraocular movements intact.     Pupils: Pupils are equal, round, and reactive to light.  Cardiovascular:     Rate and  Rhythm: Normal rate and regular rhythm.  Pulmonary:     Effort: Pulmonary effort is normal.     Breath sounds: Normal breath sounds.  Musculoskeletal:        General: Normal range of motion.     Cervical back: Normal range of motion.  Neurological:     General: No focal deficit present.     Mental Status: He is alert and oriented to person, place, and time.  Psychiatric:        Mood and Affect: Mood normal.        Behavior: Behavior normal.    Redness over the left foot  Assessment and Plan:   Andrew Harding is a 72 y.o. 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 Irene Limbo office after he went for wound check and noted upside was swollen and had erythema. Patient also had a syncopal episode couple days ago at home. He tells me he is not been his usual baseline and thinks he is not eating and drinking well.  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 -- Received IV vancomycin and Rocephin-- will continue IV Rocephin -- podiatry consultation with Dr. Ether Griffins-- await further recommendation  Uncontrolled type II diabetes with peripheral neuropathy and CKD stage IIIB -- baseline creatinine around 1.4 -- creatinine today 2.02 -- continue IV insulin and sliding scale -- patient at home on Lantus, metformin,Trulicity  Hypotension with syncopal episode in the setting of pre-renal azotemia in post-op period -- CT head negative -- CT chest negative for PE -- blood pressure improved with IV fluids  CKD stage IIIB -- as above  Hyperlipidemia -- continue statins    Advance Care Planning:   Code Status: Full Code    Consults: Podiatry  Family Communication: friend at bedside  Severity of Illness: The appropriate patient status for this patient is INPATIENT. Inpatient status is judged to be reasonable and necessary in order to provide the required intensity of service to ensure the patient's safety. The patient's presenting symptoms, physical exam findings, and initial radiographic and laboratory data in the context of their chronic comorbidities is felt to place them at high risk for further clinical deterioration. Furthermore, it is not anticipated that the patient will be medically stable for discharge from the hospital within 2 midnights of admission.   * I certify that at the point of admission it is my clinical judgment that the patient will require inpatient hospital care spanning beyond 2 midnights from the point of admission due to high intensity of service, high risk for further deterioration and high frequency of surveillance required.*  Author: Enedina Finner, MD 08/17/2022 3:25 PM  For on call review [email or web address].

## 2022-08-17 NOTE — Consult Note (Signed)
PHARMACY -  BRIEF ANTIBIOTIC NOTE   Pharmacy has received consult(s) for cellulitis from an ED provider.  The patient's profile has been reviewed for ht/wt/allergies/indication/available labs.    One time order(s) placed for vancomycin  Further antibiotics/pharmacy consults should be ordered by admitting physician if indicated.                       Thank you, Ronnald Ramp 08/17/2022  12:38 PM

## 2022-08-17 NOTE — ED Triage Notes (Signed)
Pt to ED for possible wound infection to left foot. 3 toes amputated from left foot 1 week ago.  +chills, fatigue.

## 2022-08-17 NOTE — ED Provider Notes (Signed)
[facility] Provider Note    Event Date/Time   First MD Initiated Contact with Patient 08/17/22 [phone]     (approximate)   History   Wound Check   HPI  MARTRELL EGUIA is a 72 y.o. male who is status post amputation of digits 234 on the left foot on 4/12 who comes in with concern for weakness.  Patient reports has had some intermittent chills and fatigue, feeling very weak with ambulation and had a syncopal episode on Sunday where he woke up on the ground does not think he hit his head denies any headaches, confusion, denies any neck pain.  He is not on any blood thinners.  He denies any history of atrial fibrillation denies any abdominal pain.  Does report some intermittent exertional SOB   Physical Exam   Triage Vital Signs: ED Triage Vitals  Enc Vitals Group     BP 08/17/22 0914 (!) 72/50     Pulse Rate 08/17/22 0914 (!) 101     Resp 08/17/22 0914 20     Temp --      Temp src --      SpO2 08/17/22 0914 98 %     Weight 08/17/22 0913 250 lb (113.4 kg)     Height 08/17/22 0913  (1.905 m)     Head Circumference --      Peak Flow --      Pain Score 08/17/22 0912 0     Pain Loc --      Pain Edu? --      Excl. in GC? --     Most recent vital signs: Vitals:   08/17/22 0914  BP: (!) 72/50  Pulse: (!) 101  Resp: 20  SpO2: 98%     General: Awake, no distress.  CV:  Good peripheral perfusion.  Resp:  Normal effort.  Abd:  No distention.  Other:  L foot wrapped- good distal pulse, wrap taken down and patient has some redness noted over where the cyst sutures are noted   ED Results / Procedures / Treatments   Labs (all labs ordered are listed, but only abnormal results are displayed) Labs Reviewed  CULTURE, BLOOD (ROUTINE X 2)  CULTURE, BLOOD (ROUTINE X 2)  LACTIC ACID, PLASMA  LACTIC ACID, PLASMA  COMPREHENSIVE METABOLIC PANEL  CBC WITH DIFFERENTIAL/PLATELET  URINALYSIS, ROUTINE W REFLEX MICROSCOPIC  PROTIME-INR  BRAIN NATRIURETIC  PEPTIDE  TSH  T4, FREE  MAGNESIUM  TROPONIN I (HIGH SENSITIVITY)     EKG  My interpretation of EKG:  Atrial fibrillation rate of 93 without ST elevation or T wave inversions, normal intervals  RADIOLOGY I have reviewed the xray personally interpreted and no edema or pneumonia noted   PROCEDURES:  Critical Care performed: Yes, see critical care procedure note(s)  .1-3 Lead EKG Interpretation  Performed by: Concha Se, MD Authorized by: Concha Se, MD     Interpretation: abnormal     ECG rate:  70   ECG rate assessment: normal     Rhythm: atrial fibrillation     Ectopy: none     Conduction: normal   .Critical Care  Performed by: Concha Se, MD Authorized by: Concha Se, MD   Critical care provider statement:    Critical care time (minutes):  30   Critical care was necessary to treat or prevent imminent or life-threatening deterioration of the following conditions:  Sepsis   Critical care was time spent personally by  me on the following activities:  Development of treatment plan with patient or surrogate, discussions with consultants, evaluation of patient's response to treatment, examination of patient, ordering and review of laboratory studies, ordering and review of radiographic studies, ordering and performing treatments and interventions, pulse oximetry, re-evaluation of patient's condition and review of old charts    MEDICATIONS ORDERED IN ED: Medications  cefTRIAXone (ROCEPHIN) 2 g in sodium chloride 0.9 % 100 mL IVPB (has no administration in time range)  insulin aspart (novoLOG) injection 0-9 Units (has no administration in time range)  insulin aspart (novoLOG) injection 0-5 Units (has no administration in time range)  insulin glargine-yfgn (SEMGLEE) injection 45 Units (has no administration in time range)  aspirin EC tablet 81 mg (has no administration in time range)  cholecalciferol (VITAMIN D3) 25 MCG (1000 UNIT) tablet 2,000 Units (has no  administration in time range)  gabapentin (NEURONTIN) capsule 300 mg (has no administration in time range)  pravastatin (PRAVACHOL) tablet 80 mg (has no administration in time range)  sodium chloride 0.9 % bolus 1,000 mL (0 mLs Intravenous Stopped 08/17/22 1220)  magnesium sulfate IVPB 2 g 50 mL (0 g Intravenous Stopped 08/17/22 1409)  vancomycin (VANCOCIN) IVPB 1000 mg/200 mL premix (0 mg Intravenous Stopped 08/17/22 1426)  cefTRIAXone (ROCEPHIN) 2 g in sodium chloride 0.9 % 100 mL IVPB (0 g Intravenous Stopped 08/17/22 1409)  sodium chloride 0.9 % bolus 1,000 mL (0 mLs Intravenous Stopped 08/17/22 1501)  iohexol (OMNIPAQUE) 350 MG/ML injection 60 mL (60 mLs Intravenous Contrast Given 08/17/22 1243)     IMPRESSION / MDM / ASSESSMENT AND PLAN / ED COURSE  I reviewed the triage vital signs and the nursing notes.   Patient's presentation is most consistent with acute presentation with potential threat to life or bodily function.   Patient comes in hypotensive initially 36s with multiple episodes of weakness and a syncopal episode a few days ago.  No evidence of trauma on the head denies any headaches does not think he hit his head.  Labs ordered to evaluate for dehydration, AKI, sepsis.  Last creatinine was 1.5 2  months ago BNP slightly elevated troponins negative x 2.  Mag low given some repletion.     12:27 PM reevaluated patient will get CT head given the weakness and get a CT PE to rule out PE given exertional chest pain and shortness of breath.  He is got no abdominal tenderness on exam suspect LFTs are related to dehydration I added on a CK level will give another liter of fluid given he tolerated first liter.  I will cover with antibiotics given possible foot infection  CT angio shows nodule and gallstones but I did a repeat abdominal exam he remains soft nontender did let him know the incidental findings of nodules on CT, CT head was negative  Patient was started on antibiotics to cover  for possible sepsis given low blood pressures.  Will hold off with 2 L for ideal body weight.  Will discuss with the hospital team for admission.  Patient also noted to be in new A-fib but he is currently rate controlled  The patient is on the cardiac monitor to evaluate for evidence of arrhythmia and/or significant heart rate changes.      FINAL CLINICAL IMPRESSION(S) / ED DIAGNOSES   Final diagnoses:  Sepsis, due to unspecified organism, unspecified whether acute organ dysfunction present  Cellulitis of other specified site  AKI (acute kidney injury)  Atrial fibrillation, unspecified type  Rx / DC Orders   ED Discharge Orders     None        Note:  This document was prepared using Dragon voice recognition software and may include unintentional dictation errors.   Concha Se, MD 08/17/22 [phone]

## 2022-08-18 ENCOUNTER — Inpatient Hospital Stay: Payer: Managed Care, Other (non HMO)

## 2022-08-18 DIAGNOSIS — E1122 Type 2 diabetes mellitus with diabetic chronic kidney disease: Secondary | ICD-10-CM | POA: Diagnosis not present

## 2022-08-18 DIAGNOSIS — A419 Sepsis, unspecified organism: Secondary | ICD-10-CM | POA: Diagnosis not present

## 2022-08-18 DIAGNOSIS — L03818 Cellulitis of other sites: Secondary | ICD-10-CM | POA: Diagnosis not present

## 2022-08-18 DIAGNOSIS — N1832 Chronic kidney disease, stage 3b: Secondary | ICD-10-CM | POA: Diagnosis not present

## 2022-08-18 LAB — CULTURE, BLOOD (ROUTINE X 2): Culture: NO GROWTH

## 2022-08-18 LAB — GLUCOSE, CAPILLARY
Glucose-Capillary: 213 mg/dL — ABNORMAL HIGH (ref 70–99)
Glucose-Capillary: 234 mg/dL — ABNORMAL HIGH (ref 70–99)
Glucose-Capillary: 214 mg/dL — ABNORMAL HIGH (ref 70–99)
Glucose-Capillary: 255 mg/dL — ABNORMAL HIGH (ref 70–99)

## 2022-08-18 MED ORDER — SODIUM CHLORIDE 0.9 % IV SOLN
2.0000 g | Freq: Two times a day (BID) | INTRAVENOUS | Status: DC
  Administered 2022-08-18 – 2022-08-21 (×5): 2 g via INTRAVENOUS
  Filled 2022-08-18: qty 2
  Filled 2022-08-18: qty 12.5
  Filled 2022-08-18: qty 2
  Filled 2022-08-18 (×2): qty 12.5
  Filled 2022-08-18 (×2): qty 2

## 2022-08-18 MED ORDER — SODIUM CHLORIDE 0.9 % IV SOLN: INTRAVENOUS | Status: DC

## 2022-08-18 MED ORDER — ENSURE ENLIVE PO LIQD
237.0000 mL | Freq: Two times a day (BID) | ORAL | Status: DC
  Administered 2022-08-18 – 2022-08-29 (×11): 237 mL via ORAL

## 2022-08-18 MED ORDER — INSULIN GLARGINE-YFGN 100 UNIT/ML ~~LOC~~ SOLN
50.0000 [IU] | Freq: Every day | SUBCUTANEOUS | Status: DC
  Administered 2022-08-18 – 2022-08-28 (×11): 50 [IU] via SUBCUTANEOUS
  Filled 2022-08-18 (×12): qty 0.5

## 2022-08-18 MED ORDER — ENOXAPARIN SODIUM 60 MG/0.6ML IJ SOSY
0.5000 mg/kg | PREFILLED_SYRINGE | INTRAMUSCULAR | Status: DC
  Administered 2022-08-18 – 2022-08-20 (×3): 57.5 mg via SUBCUTANEOUS
  Filled 2022-08-18 (×3): qty 0.6

## 2022-08-18 NOTE — Progress Notes (Signed)
   08/18/22 1700  Vitals  Temp (!) 103.2 F (39.6 C)  Temp Source Oral  BP 129/71  MAP (mmHg) 89  BP Location Right Arm  BP Method Automatic  Patient Position (if appropriate) Lying  Pulse Rate 97  Pulse Rate Source Dinamap  Resp 18  Level of Consciousness  Level of Consciousness Alert  MEWS COLOR  MEWS Score Color Yellow  Oxygen Therapy  SpO2 98 %  O2 Device Room Air  Pain Assessment  Pain Scale 0-10  Pain Score 0  MEWS Score  MEWS Temp 2  MEWS Systolic 0  MEWS Pulse 0  MEWS RR 0  MEWS LOC 0  MEWS Score 2   Dr. Allena Katz notified. Tylenol given, temp recheck 101.1
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
## 2022-08-18 NOTE — Progress Notes (Signed)
[facility]  - [place] at [facility]   PATIENT NAME: Andrew Harding    MR#:  [id number]  DATE OF BIRTH:  08/29/1950  SUBJECTIVE:   Patient seen earlier. Wife at bedside. Questions answered. Patient feels week. Not much appetite. Discussed with him he has to hydrate and try to eat. Offered protein drinks agreeable to it. Low-grade temperature today. Soft blood pressure.   VITALS:  Blood pressure (!) 108/55, pulse 86, temperature 100 F (37.8 C), resp. rate 18, height  (1.905 m), weight 113.4 kg, SpO2 98 %.  PHYSICAL EXAMINATION:   GENERAL:  72 y.o.-year-old patient with no acute distress.  LUNGS: Normal breath sounds bilaterally, no wheezing CARDIOVASCULAR: S1, S2 normal. No murmur   ABDOMEN: Soft, nontender, nondistended. Bowel sounds present.  EXTREMITIES:  From today 08/18/2022 NEUROLOGIC: nonfocal  patient is alert and awake SKIN: as above  LABORATORY PANEL:  CBC Recent Labs  Lab 08/17/22 0944  WBC 15.9*  HGB 11.4*  HCT 34.8*  PLT 372    Chemistries  Recent Labs  Lab 08/17/22 0944  NA 135  K 3.4*  CL 100  CO2 24  GLUCOSE 146*  BUN 29*  CREATININE 2.02*  CALCIUM 9.0  MG 1.3*  AST 244*  ALT 181*  ALKPHOS 132*  BILITOT 0.9   Cardiac Enzymes No results for input(s): "TROPONINI" in the last 168 hours. RADIOLOGY:  CT Angio Chest PE W and/or Wo Contrast  Result Date: 08/17/2022 CLINICAL DATA:  Sepsis. EXAM: CT ANGIOGRAPHY CHEST WITH CONTRAST TECHNIQUE: Multidetector CT imaging of the chest was performed using the standard protocol during bolus administration of intravenous contrast. Multiplanar CT image reconstructions and MIPs were obtained to evaluate the vascular anatomy. RADIATION DOSE REDUCTION: This exam was performed according to the departmental dose-optimization program which includes automated exposure control, adjustment of the mA and/or kV according to patient size and/or use of iterative reconstruction technique. CONTRAST:  60mL  OMNIPAQUE IOHEXOL 350 MG/ML SOLN COMPARISON:  Chest x-ray 08/17/2022 earlier FINDINGS: Cardiovascular: No pulmonary embolism identified. Status post median sternotomy. Heart is nonenlarged. No significant pericardial effusion. The thoracic aorta has a normal course and caliber with mild atherosclerotic calcified plaque. Coronary artery calcifications are noted. 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 Mediastinum/Nodes: Mildly patulous esophagus. No specific abnormal lymph node enlargement seen in the axillary region, hilum or mediastinum. Lungs/Pleura: Mild diffuse breathing motion. No consolidation, pneumothorax or effusion. Dependent atelectasis. 3 mm nodule right upper lobe on series 5, image 77 is noncalcified. Similar focus on image 56 but this has a component of calcification peripherally. Upper Abdomen: Gallstones in the nondilated gallbladder. There is dystrophic calcification along the left adrenal gland. Please correlate with any previous infectious, inflammatory or traumatic process. This was seen on the CT scan of 2014 of the abdomen and pelvis. Musculoskeletal: Mild degenerative changes along the spine. Review of the MIP images confirms the above findings. IMPRESSION: No pulmonary embolism identified.  Mild breathing motion. Postop chest. Gallstones. 3 mm lung nodules. No follow-up needed if patient is low-risk (and has no known or suspected primary neoplasm). Non-contrast chest CT can be considered in 12 months if patient is high-risk. This recommendation follows the consensus statement: Guidelines for Management of Incidental Pulmonary Nodules Detected on CT Images: From the Fleischner Society 2017; Radiology 2017; 284:228-243. Aortic Atherosclerosis (ICD10-I70.0). Electronically Signed   By: Karen Kays M.D.   On: 08/17/2022 13:43   CT HEAD WO CONTRAST ( )  Result Date: 08/17/2022 CLINICAL DATA:  Possible wound infection to left foot EXAM: CT HEAD WITHOUT CONTRAST TECHNIQUE:  Contiguous axial images were obtained from the base of the skull through the vertex without intravenous contrast. RADIATION DOSE REDUCTION: This exam was performed according to the departmental dose-optimization program which includes automated exposure control, adjustment of the mA and/or kV according to patient size and/or use of iterative reconstruction technique. COMPARISON:  None Available. FINDINGS: Brain: No evidence of acute infarction, hemorrhage, mass, mass effect, or midline shift. No hydrocephalus or extra-axial fluid collection. Vascular: No hyperdense vessel. Atherosclerotic calcifications in the intracranial carotid and vertebral arteries. Skull: Negative for fracture or focal lesion. Sinuses/Orbits: Mucosal thickening in the ethmoid air cells and left maxillary sinus. No acute finding in the orbits. Status post bilateral lens replacements. Other: The mastoid air cells are well aerated. IMPRESSION: No acute intracranial process. Electronically Signed   By: Wiliam Ke M.D.   On: 08/17/2022 13:35   DG Chest 2 View  Result Date: 08/17/2022 CLINICAL DATA:  Sepsis EXAM: CHEST - 2 VIEW COMPARISON:  CXR 07/19/12 FINDINGS: Status median sternotomy and CABG. No pleural effusion. No pneumothorax. Normal cardiac and mediastinal contours. No focal airspace opacity. No radiographically apparent displaced rib fractures. Vertebral body heights are visualized upper abdomen is unremarkable IMPRESSION: No focal airspace opacity Electronically Signed   By: Lorenza Cambridge M.D.   On: 08/17/2022 10:20    Assessment and Plan  Andrew Harding is a 72 y.o. 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 Irene Limbo office after he went for wound check and noted upside was swollen and had erythema. Patient also had a syncopal episode couple days ago at home. He tells me he is not  been his usual baseline and thinks he is not eating and drinking well.   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 -- Received IV vancomycin and Rocephin-- will change to IV cefepime -- podiatry consultation with Dr. Scherry Ran-- await further recommendation   Uncontrolled type II diabetes with peripheral neuropathy and CKD stage IIIB -- baseline creatinine around 1.4 -- creatinine today 2.02 -- continue IV insulin and sliding scale -- patient at home on Lantus, metformin,Trulicity --A1c 6.9%   Hypotension with syncopal episode in the setting of pre-renal azotemia in post-op period -- CT head negative -- CT chest negative for PE -- blood pressure improved with IV fluids   CKD stage IIIB -- as above   Hyperlipidemia -- continue statins      Advance Care Planning:   Code Status: Full Code   Consults: Podiatry  Family Communication:  wife at bedside DVT Prophylaxis :lovenox Level of care: Med-Surg Status is: Inpatient Remains inpatient appropriate because: foot infection    TOTAL TIME TAKING CARE OF THIS PATIENT: 35 minutes.  >50% time spent on counselling and coordination of care  Note: This dictation was prepared with Dragon dictation along with smaller phrase technology. Any transcriptional errors that result from this process are unintentional.  Enedina Finner M.D    [facility]   CC: Primary care physician; Lynnea Ferrier, MD

## 2022-08-18 NOTE — Progress Notes (Signed)
Anticoagulation monitoring(Lovenox):  72yo  M ordered Lovenox 40 mg Q24h    Filed Weights   08/17/22 0913  Weight: 113.4 kg (250 lb)   BMI 31.25   Lab Results  Component Value Date   CREATININE 2.02 (H) 08/17/2022   CREATININE 1.43 (H) 06/02/2015   CREATININE 1.42 (H) 06/01/2015   Estimated Creatinine Clearance: 44.3 mL/min (A) (by C-G formula based on SCr of 2.02 mg/dL (H)). Hemoglobin & Hematocrit     Component Value Date/Time   HGB 11.4 (L) 08/17/2022 0944   HCT 34.8 (L) 08/17/2022 0944     Per Protocol for Patient with estCrcl > 30 ml/min and BMI > 30, will transition to Lovenox 0.5 mg/kg Q24h       Bari Mantis PharmD Clinical Pharmacist 08/18/2022

## 2022-08-19 ENCOUNTER — Inpatient Hospital Stay: Payer: Managed Care, Other (non HMO) | Admitting: Anesthesiology

## 2022-08-19 ENCOUNTER — Inpatient Hospital Stay: Payer: Managed Care, Other (non HMO)

## 2022-08-19 ENCOUNTER — Encounter: Payer: Self-pay | Admitting: Internal Medicine

## 2022-08-19 ENCOUNTER — Encounter
Admission: EM | Disposition: A | Discharge status: Rehabilitation Facility | Payer: Self-pay | Source: Home / Self Care | Attending: Internal Medicine

## 2022-08-19 ENCOUNTER — Other Ambulatory Visit: Payer: Self-pay

## 2022-08-19 DIAGNOSIS — E1122 Type 2 diabetes mellitus with diabetic chronic kidney disease: Secondary | ICD-10-CM | POA: Diagnosis not present

## 2022-08-19 DIAGNOSIS — I4891 Unspecified atrial fibrillation: Secondary | ICD-10-CM

## 2022-08-19 DIAGNOSIS — L089 Local infection of the skin and subcutaneous tissue, unspecified: Secondary | ICD-10-CM

## 2022-08-19 DIAGNOSIS — A419 Sepsis, unspecified organism: Secondary | ICD-10-CM | POA: Diagnosis not present

## 2022-08-19 HISTORY — DX: Local infection of the skin and subcutaneous tissue, unspecified: L08.9

## 2022-08-19 HISTORY — PX: AMPUTATION: SHX166

## 2022-08-19 HISTORY — DX: Unspecified atrial fibrillation: I48.91

## 2022-08-19 LAB — COMPREHENSIVE METABOLIC PANEL
ALT: 153 U/L — ABNORMAL HIGH (ref 0–44)
AST: 126 U/L — ABNORMAL HIGH (ref 15–41)
Albumin: 2.4 g/dL — ABNORMAL LOW (ref 3.5–5.0)
Alkaline Phosphatase: 127 U/L — ABNORMAL HIGH (ref 38–126)
Anion gap: 7 (ref 5–15)
BUN: 18 mg/dL (ref 8–23)
CO2: 23 mmol/L (ref 22–32)
Calcium: 8.1 mg/dL — ABNORMAL LOW (ref 8.9–10.3)
Chloride: 104 mmol/L (ref 98–111)
Creatinine, Ser: 1.49 mg/dL — ABNORMAL HIGH (ref 0.61–1.24)
GFR, Estimated: 49 mL/min — ABNORMAL LOW (ref >60)
Glucose, Bld: 157 mg/dL — ABNORMAL HIGH (ref 70–99)
Potassium: 3.9 mmol/L (ref 3.5–5.1)
Sodium: 134 mmol/L — ABNORMAL LOW (ref 135–145)
Total Bilirubin: 0.9 mg/dL (ref 0.3–1.2)
Total Protein: 6.6 g/dL (ref 6.5–8.1)

## 2022-08-19 LAB — BASIC METABOLIC PANEL
Anion gap: 9 (ref 5–15)
BUN: 19 mg/dL (ref 8–23)
CO2: 21 mmol/L — ABNORMAL LOW (ref 22–32)
Calcium: 8 mg/dL — ABNORMAL LOW (ref 8.9–10.3)
Chloride: 103 mmol/L (ref 98–111)
Creatinine, Ser: 1.5 mg/dL — ABNORMAL HIGH (ref 0.61–1.24)
GFR, Estimated: 49 mL/min — ABNORMAL LOW (ref >60)
Glucose, Bld: 254 mg/dL — ABNORMAL HIGH (ref 70–99)
Potassium: 4 mmol/L (ref 3.5–5.1)
Sodium: 133 mmol/L — ABNORMAL LOW (ref 135–145)

## 2022-08-19 LAB — CBC
HCT: 27.5 % — ABNORMAL LOW (ref 39.0–52.0)
HCT: 28.6 % — ABNORMAL LOW (ref 39.0–52.0)
Hemoglobin: 9.1 g/dL — ABNORMAL LOW (ref 13.0–17.0)
Hemoglobin: 9.5 g/dL — ABNORMAL LOW (ref 13.0–17.0)
MCH: 30.5 pg (ref 26.0–34.0)
MCH: 30.7 pg (ref 26.0–34.0)
MCHC: 33.1 g/dL (ref 30.0–36.0)
MCHC: 33.2 g/dL (ref 30.0–36.0)
MCV: 92.3 fL (ref 80.0–100.0)
MCV: 92.6 fL (ref 80.0–100.0)
Platelets: 269 10*3/uL (ref 150–400)
Platelets: 276 10*3/uL (ref 150–400)
RBC: 2.98 MIL/uL — ABNORMAL LOW (ref 4.22–5.81)
RBC: 3.09 MIL/uL — ABNORMAL LOW (ref 4.22–5.81)
RDW: 12.3 % (ref 11.5–15.5)
RDW: 12.5 % (ref 11.5–15.5)
WBC: 12.2 10*3/uL — ABNORMAL HIGH (ref 4.0–10.5)
WBC: 13 10*3/uL — ABNORMAL HIGH (ref 4.0–10.5)
nRBC: 0 % (ref 0.0–0.2)
nRBC: 0 % (ref 0.0–0.2)

## 2022-08-19 LAB — AEROBIC CULTURE W GRAM STAIN (SUPERFICIAL SPECIMEN)

## 2022-08-19 LAB — C-REACTIVE PROTEIN: CRP: 17.8 mg/dL — ABNORMAL HIGH (ref <1.0)

## 2022-08-19 LAB — GLUCOSE, CAPILLARY
Glucose-Capillary: 198 mg/dL — ABNORMAL HIGH (ref 70–99)
Glucose-Capillary: 137 mg/dL — ABNORMAL HIGH (ref 70–99)
Glucose-Capillary: 152 mg/dL — ABNORMAL HIGH (ref 70–99)
Glucose-Capillary: 180 mg/dL — ABNORMAL HIGH (ref 70–99)

## 2022-08-19 LAB — SEDIMENTATION RATE: Sed Rate: 128 mm/hr — ABNORMAL HIGH (ref 0–20)

## 2022-08-19 LAB — CULTURE, BLOOD (ROUTINE X 2)

## 2022-08-19 SURGERY — AMPUTATION, FOOT, RAY
Anesthesia: General | Site: Foot | Laterality: Left

## 2022-08-19 MED ORDER — PHENYLEPHRINE HCL (PRESSORS) 10 MG/ML IV SOLN
INTRAVENOUS | Status: DC | PRN
  Administered 2022-08-19 (×3): 160 ug via INTRAVENOUS
  Administered 2022-08-19: 240 ug via INTRAVENOUS
  Administered 2022-08-19 (×4): 160 ug via INTRAVENOUS

## 2022-08-19 MED ORDER — LIDOCAINE HCL (PF) 2 % IJ SOLN
INTRAMUSCULAR | Status: DC | PRN
  Administered 2022-08-19: 100 mg via INTRADERMAL
  Administered 2022-08-19: 80 mg via INTRADERMAL

## 2022-08-19 MED ORDER — IPRATROPIUM-ALBUTEROL 0.5-2.5 (3) MG/3ML IN SOLN
3.0000 mL | RESPIRATORY_TRACT | Status: DC
  Administered 2022-08-19: 3 mL via RESPIRATORY_TRACT

## 2022-08-19 MED ORDER — BUPIVACAINE HCL 0.5 % IJ SOLN
INTRAMUSCULAR | Status: DC | PRN
  Administered 2022-08-19: 20 mL

## 2022-08-19 MED ORDER — EPHEDRINE SULFATE (PRESSORS) 50 MG/ML IJ SOLN
10.0000 mg | Freq: Once | INTRAMUSCULAR | Status: AC
  Administered 2022-08-19: 10 mg via INTRAVENOUS

## 2022-08-19 MED ORDER — VANCOMYCIN HCL 1250 MG/250ML IV SOLN
1250.0000 mg | INTRAVENOUS | Status: DC
  Administered 2022-08-20: 1250 mg via INTRAVENOUS
  Filled 2022-08-19 (×2): qty 250

## 2022-08-19 MED ORDER — VANCOMYCIN HCL 1000 MG IV SOLR
INTRAVENOUS | Status: DC | PRN
  Administered 2022-08-19: 1000 mg via TOPICAL

## 2022-08-19 MED ORDER — FENTANYL CITRATE (PF) 100 MCG/2ML IJ SOLN
INTRAMUSCULAR | Status: AC
  Filled 2022-08-19: qty 2

## 2022-08-19 MED ORDER — IPRATROPIUM-ALBUTEROL 0.5-2.5 (3) MG/3ML IN SOLN
RESPIRATORY_TRACT | Status: AC
  Filled 2022-08-19: qty 3

## 2022-08-19 MED ORDER — POVIDONE-IODINE 10 % EX SWAB: 2.0000 | Freq: Once | CUTANEOUS | Status: DC

## 2022-08-19 MED ORDER — SODIUM CHLORIDE 0.9 % IR SOLN
Status: DC | PRN
  Administered 2022-08-19: 3000 mL
  Administered 2022-08-19: 1000 mL

## 2022-08-19 MED ORDER — FENTANYL CITRATE (PF) 100 MCG/2ML IJ SOLN
25.0000 ug | INTRAMUSCULAR | Status: DC | PRN
  Administered 2022-08-19: 25 ug via INTRAVENOUS

## 2022-08-19 MED ORDER — OXYCODONE HCL 5 MG/5ML PO SOLN: 5.0000 mg | Freq: Once | ORAL | Status: DC | PRN

## 2022-08-19 MED ORDER — ACETAMINOPHEN 10 MG/ML IV SOLN
INTRAVENOUS | Status: AC
  Filled 2022-08-19: qty 100

## 2022-08-19 MED ORDER — DEXMEDETOMIDINE HCL IN NACL 200 MCG/50ML IV SOLN
INTRAVENOUS | Status: DC | PRN
  Administered 2022-08-19: 16 ug via INTRAVENOUS

## 2022-08-19 MED ORDER — PROPOFOL 1000 MG/100ML IV EMUL
INTRAVENOUS | Status: AC
  Filled 2022-08-19: qty 100

## 2022-08-19 MED ORDER — LACTATED RINGERS IV SOLN: INTRAVENOUS | Status: DC

## 2022-08-19 MED ORDER — PROPOFOL 500 MG/50ML IV EMUL
INTRAVENOUS | Status: DC | PRN
  Administered 2022-08-19: 150 ug/kg/min via INTRAVENOUS

## 2022-08-19 MED ORDER — PHENYLEPHRINE 80 MCG/ML (10ML) SYRINGE FOR IV PUSH (FOR BLOOD PRESSURE SUPPORT)
PREFILLED_SYRINGE | INTRAVENOUS | Status: AC
  Filled 2022-08-19: qty 10

## 2022-08-19 MED ORDER — EPHEDRINE SULFATE (PRESSORS) 50 MG/ML IJ SOLN
INTRAMUSCULAR | Status: AC
  Filled 2022-08-19: qty 1

## 2022-08-19 MED ORDER — ONDANSETRON HCL 4 MG/2ML IJ SOLN: 4.0000 mg | Freq: Once | INTRAMUSCULAR | Status: DC | PRN

## 2022-08-19 MED ORDER — INSULIN ASPART 100 UNIT/ML IJ SOLN
4.0000 [IU] | Freq: Three times a day (TID) | INTRAMUSCULAR | Status: DC
  Administered 2022-08-20 (×2): 4 [IU] via SUBCUTANEOUS
  Filled 2022-08-19 (×2): qty 1

## 2022-08-19 MED ORDER — 0.9 % SODIUM CHLORIDE (POUR BTL) OPTIME
TOPICAL | Status: DC | PRN
  Administered 2022-08-19: 1000 mL

## 2022-08-19 MED ORDER — LIDOCAINE HCL (PF) 2 % IJ SOLN
INTRAMUSCULAR | Status: AC
  Filled 2022-08-19: qty 5

## 2022-08-19 MED ORDER — OXYCODONE HCL 5 MG PO TABS: 5.0000 mg | ORAL_TABLET | Freq: Once | ORAL | Status: DC | PRN

## 2022-08-19 MED ORDER — SODIUM CHLORIDE 0.9 % IV SOLN: INTRAVENOUS | Status: DC

## 2022-08-19 MED ORDER — FENTANYL CITRATE (PF) 100 MCG/2ML IJ SOLN
INTRAMUSCULAR | Status: DC | PRN
  Administered 2022-08-19: 25 ug via INTRAVENOUS

## 2022-08-19 MED ORDER — ACETAMINOPHEN 10 MG/ML IV SOLN
INTRAVENOUS | Status: DC | PRN
  Administered 2022-08-19: 1000 mg via INTRAVENOUS

## 2022-08-19 MED ORDER — ACETAMINOPHEN 10 MG/ML IV SOLN: 1000.0000 mg | Freq: Once | INTRAVENOUS | Status: DC | PRN

## 2022-08-19 MED ORDER — VANCOMYCIN HCL 2000 MG/400ML IV SOLN
2000.0000 mg | Freq: Once | INTRAVENOUS | Status: AC
  Administered 2022-08-19: 2000 mg via INTRAVENOUS
  Filled 2022-08-19: qty 400

## 2022-08-19 SURGICAL SUPPLY — 52 items
BLADE MED AGGRESSIVE (BLADE) ×1 IMPLANT
BLADE OSC/SAGITTAL MD 5.5X18 (BLADE) IMPLANT
BLADE SURG 15 STRL LF DISP TIS (BLADE) IMPLANT
BLADE SURG 15 STRL SS (BLADE) ×2
BLADE SURG MINI STRL (BLADE) IMPLANT
BNDG CMPR 75X21 PLY HI ABS (MISCELLANEOUS)
BNDG ELASTIC 4X5.8 VLCR STR LF (GAUZE/BANDAGES/DRESSINGS) ×1 IMPLANT
BNDG ESMARCH 4 X 12 STRL LF (GAUZE/BANDAGES/DRESSINGS) ×1
BNDG ESMARCH 4X12 STRL LF (GAUZE/BANDAGES/DRESSINGS) ×1 IMPLANT
BNDG GAUZE DERMACEA FLUFF 4 (GAUZE/BANDAGES/DRESSINGS) ×1 IMPLANT
BNDG GZE DERMACEA 4 6PLY (GAUZE/BANDAGES/DRESSINGS) ×1
CNTNR URN SCR LID CUP LEK RST (MISCELLANEOUS) ×1 IMPLANT
CONT SPEC 4OZ STRL OR WHT (MISCELLANEOUS)
CUFF TOURN SGL QUICK 12 (TOURNIQUET CUFF) IMPLANT
CUFF TOURN SGL QUICK 18X4 (TOURNIQUET CUFF) IMPLANT
DRAPE FLUOR MINI C-ARM 54X84 (DRAPES) IMPLANT
DURAPREP 26ML APPLICATOR (WOUND CARE) ×1 IMPLANT
ELECT REM PT RETURN 9FT ADLT (ELECTROSURGICAL) ×1
ELECTRODE REM PT RTRN 9FT ADLT (ELECTROSURGICAL) ×1 IMPLANT
GAUZE SPONGE 4X4 12PLY STRL (GAUZE/BANDAGES/DRESSINGS) ×2 IMPLANT
GAUZE STRETCH 2X75IN STRL (MISCELLANEOUS) IMPLANT
GAUZE XEROFORM 1X8 LF (GAUZE/BANDAGES/DRESSINGS) ×1 IMPLANT
GLOVE BIO SURGEON STRL SZ7 (GLOVE) ×1 IMPLANT
GLOVE INDICATOR 7.0 STRL GRN (GLOVE) ×1 IMPLANT
GOWN STRL REUS W/ TWL LRG LVL3 (GOWN DISPOSABLE) ×2 IMPLANT
GOWN STRL REUS W/TWL LRG LVL3 (GOWN DISPOSABLE) ×2
HANDPIECE VERSAJET DEBRIDEMENT (MISCELLANEOUS) IMPLANT
IV NS IRRIG 3000ML ARTHROMATIC (IV SOLUTION) IMPLANT
KIT STIMULAN RAPID CURE 5CC (Orthopedic Implant) IMPLANT
KIT TURNOVER KIT A (KITS) ×1 IMPLANT
LABEL OR SOLS (LABEL) ×1 IMPLANT
MANIFOLD NEPTUNE II (INSTRUMENTS) ×1 IMPLANT
NDL FILTER BLUNT 18X1 1/2 (NEEDLE) ×1 IMPLANT
NDL HYPO 25X1 1.5 SAFETY (NEEDLE) ×2 IMPLANT
NEEDLE FILTER BLUNT 18X1 1/2 (NEEDLE) ×1 IMPLANT
NEEDLE HYPO 25X1 1.5 SAFETY (NEEDLE) ×1 IMPLANT
NS IRRIG 500ML POUR BTL (IV SOLUTION) ×1 IMPLANT
PACK EXTREMITY ARMC (MISCELLANEOUS) ×1 IMPLANT
PULSAVAC PLUS IRRIG FAN TIP (DISPOSABLE) ×1
SOL PREP PVP 2OZ (MISCELLANEOUS) ×1
SOLUTION PREP PVP 2OZ (MISCELLANEOUS) ×1 IMPLANT
STAPLER SKIN PROX 35W (STAPLE) IMPLANT
STOCKINETTE STRL 6IN 960660 (GAUZE/BANDAGES/DRESSINGS) ×1 IMPLANT
SUT ETHILON 3-0 FS-10 30 BLK (SUTURE) ×3
SUT VIC AB 3-0 SH 27 (SUTURE) ×2
SUT VIC AB 3-0 SH 27X BRD (SUTURE) ×1 IMPLANT
SUTURE EHLN 3-0 FS-10 30 BLK (SUTURE) ×2 IMPLANT
SWAB CULTURE AMIES ANAERIB BLU (MISCELLANEOUS) IMPLANT
SYR 10ML LL (SYRINGE) ×1 IMPLANT
TIP FAN IRRIG PULSAVAC PLUS (DISPOSABLE) IMPLANT
TRAP FLUID SMOKE EVACUATOR (MISCELLANEOUS) ×1 IMPLANT
WATER STERILE IRR 500ML POUR (IV SOLUTION) ×1 IMPLANT

## 2022-08-19 NOTE — Progress Notes (Signed)
[facility]  - [place] at [facility]   PATIENT NAME: Andrew Harding    MR#:  [id number]  DATE OF BIRTH:  28-Nov-1950  SUBJECTIVE:   Patient seen earlier. Wife at bedside. Questions answered. Dr Excell Seltzer in the room. Continues with have pus drainage from the post op wound. Sutures opened. Will need further OR debridement/possible TMT amputation  VITALS:  Blood pressure (!) 105/58, pulse 66, temperature 99.9 F (37.7 C), resp. rate 20, height  (1.905 m), weight 113.4 kg, SpO2 94 %.  PHYSICAL EXAMINATION:   GENERAL:  72 y.o.-year-old patient with no acute distress.  LUNGS: Normal breath sounds bilaterally, no wheezing CARDIOVASCULAR: S1, S2 normal. No murmur   ABDOMEN: Soft, nontender, nondistended. Bowel sounds present.  EXTREMITIES:  From  08/18/2022 NEUROLOGIC: nonfocal  patient is alert and awake SKIN: as above  LABORATORY PANEL:  CBC Recent Labs  Lab 08/19/22 1123  WBC 13.0*  HGB 9.5*  HCT 28.6*  PLT 276     Chemistries  Recent Labs  Lab 08/17/22 0944 08/19/22 0453 08/19/22 1123  NA 135   < > 134*  K 3.4*   < > 3.9  CL 100   < > 104  CO2 24   < > 23  GLUCOSE 146*   < > 157*  BUN 29*   < > 18  CREATININE 2.02*   < > 1.49*  CALCIUM 9.0   < > 8.1*  MG 1.3*  --   --   AST 244*  --  126*  ALT 181*  --  153*  ALKPHOS 132*  --  127*  BILITOT 0.9  --  0.9   < > = values in this interval not displayed.    Cardiac Enzymes No results for input(s): "TROPONINI" in the last 168 hours. RADIOLOGY:  MR FOOT LEFT WO CONTRAST  Result Date: 08/18/2022 CLINICAL DATA:  Foot swelling, diabetic, osteomyelitis suspected, xray done EXAM: MRI OF THE LEFT FOOT WITHOUT CONTRAST TECHNIQUE: Multiplanar, multisequence MR imaging of the left forefoot was performed. No intravenous contrast was administered. COMPARISON:  None Available. FINDINGS: Bones/Joint/Cartilage Postsurgical changes of amputation of the second and third toes at the metatarsophalangeal joint level.  Prior fourth toe amputation through the base of the proximal phalanx. Surgical defect or focal erosion involving the distal aspect of the residual fourth toe proximal phalanx (series 9, image 14). There is periosteal edema of the second and third metatarsal heads with relatively mild bone marrow edema. Prominent bone marrow edema within the first and third metatarsal diaphyses and at the proximal metaphysis of the second metatarsal. No well-defined fracture line. Normal alignment. Relatively mild degenerative changes. Ligaments Intact Lisfranc ligament. Remaining collateral ligaments are intact. Muscles and Tendons Chronic denervation changes of the foot musculature. Amputation changes of the musculotendinous structures of the second through fourth toes. No tenosynovitis. Soft tissues Soft tissue wound or ulceration at the dorsal aspect of the forefoot overlying the second metatarsal head. Additional shallow wounds or ulcerations dorsal to the third and fourth metatarsal heads. Extensive soft tissue edema. No organized or drainable fluid collections. IMPRESSION: 1. Postsurgical changes of amputation of the second through fourth toes. Soft tissue wounds or ulcerations at the dorsal aspect of the forefoot overlying the second metatarsal head. Additional shallow wounds or ulcerations dorsal to the third and fourth metatarsal heads. 2. Small postsurgical defect or focal erosion involving the distal aspect of the residual fourth toe proximal phalanx. Appearance is suspicious for osteomyelitis. 3. Periosteal edema of  the second and third metatarsal heads with relatively mild bone marrow edema. Findings are suspicious for early acute osteomyelitis. 4. Prominent bone marrow edema within the first and third metatarsal diaphyses and at the proximal metaphysis of the second metatarsal. No well-defined fracture line. Findings are favored to represent stress-related changes. 5. Extensive soft tissue edema. No organized or  drainable fluid collections. Electronically Signed   By: Duanne Guess D.O.   On: 08/18/2022 20:24   CT Angio Chest PE W and/or Wo Contrast  Result Date: 08/17/2022 CLINICAL DATA:  Sepsis. EXAM: CT ANGIOGRAPHY CHEST WITH CONTRAST TECHNIQUE: Multidetector CT imaging of the chest was performed using the standard protocol during bolus administration of intravenous contrast. Multiplanar CT image reconstructions and MIPs were obtained to evaluate the vascular anatomy. RADIATION DOSE REDUCTION: This exam was performed according to the departmental dose-optimization program which includes automated exposure control, adjustment of the mA and/or kV according to patient size and/or use of iterative reconstruction technique. CONTRAST:  60mL OMNIPAQUE IOHEXOL 350 MG/ML SOLN COMPARISON:  Chest x-ray 08/17/2022 earlier FINDINGS: Cardiovascular: No pulmonary embolism identified. Status post median sternotomy. Heart is nonenlarged. No significant pericardial effusion. The thoracic aorta has a normal course and caliber with mild atherosclerotic calcified plaque. Coronary artery calcifications are noted. 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 Mediastinum/Nodes: Mildly patulous esophagus. No specific abnormal lymph node enlargement seen in the axillary region, hilum or mediastinum. Lungs/Pleura: Mild diffuse breathing motion. No consolidation, pneumothorax or effusion. Dependent atelectasis. 3 mm nodule right upper lobe on series 5, image 77 is noncalcified. Similar focus on image 56 but this has a component of calcification peripherally. Upper Abdomen: Gallstones in the nondilated gallbladder. There is dystrophic calcification along the left adrenal gland. Please correlate with any previous infectious, inflammatory or traumatic process. This was seen on the CT scan of 2014 of the abdomen and pelvis. Musculoskeletal: Mild degenerative changes along the spine. Review of the MIP images confirms the above  findings. IMPRESSION: No pulmonary embolism identified.  Mild breathing motion. Postop chest. Gallstones. 3 mm lung nodules. No follow-up needed if patient is low-risk (and has no known or suspected primary neoplasm). Non-contrast chest CT can be considered in 12 months if patient is high-risk. This recommendation follows the consensus statement: Guidelines for Management of Incidental Pulmonary Nodules Detected on CT Images: From the Fleischner Society 2017; Radiology 2017; 284:228-243. Aortic Atherosclerosis (ICD10-I70.0). Electronically Signed   By: Karen Kays M.D.   On: 08/17/2022 13:43   CT HEAD WO CONTRAST ( )  Result Date: 08/17/2022 CLINICAL DATA:  Possible wound infection to left foot EXAM: CT HEAD WITHOUT CONTRAST TECHNIQUE: Contiguous axial images were obtained from the base of the skull through the vertex without intravenous contrast. RADIATION DOSE REDUCTION: This exam was performed according to the departmental dose-optimization program which includes automated exposure control, adjustment of the mA and/or kV according to patient size and/or use of iterative reconstruction technique. COMPARISON:  None Available. FINDINGS: Brain: No evidence of acute infarction, hemorrhage, mass, mass effect, or midline shift. No hydrocephalus or extra-axial fluid collection. Vascular: No hyperdense vessel. Atherosclerotic calcifications in the intracranial carotid and vertebral arteries. Skull: Negative for fracture or focal lesion. Sinuses/Orbits: Mucosal thickening in the ethmoid air cells and left maxillary sinus. No acute finding in the orbits. Status post bilateral lens replacements. Other: The mastoid air cells are well aerated. IMPRESSION: No acute intracranial process. Electronically Signed   By: Wiliam Ke M.D.   On: 08/17/2022 13:35  Assessment and Plan  Andrew Harding is a 72 y.o. 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 Irene Limbo office after he went for wound check and noted upside was swollen and had erythema. Patient also had a syncopal episode couple days ago at home. He tells me he is not been his usual baseline and thinks he is not eating and drinking well.   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 -- Received IV vancomycin and Rocephin-- will change to IV cefepime +Vacnomycin -- podiatry consultation with Dr. Lodema Pilot Baker--recommends TMT with possible further exploration --WC FEW WBC PRESENT, PREDOMINANTLY PMN  MODERATE GRAM NEGATIVE RODS  FEW GRAM POSITIVE COCCI  --BC negative  Atrial fibrillation, New, rate controlled H/o CAD,s/p CABG x1 in 2014 --repeat EKG showed Afib -- patient denies chest pain shortness of breath and palpitations -anesthesia requires cardiology clearance. -- Discussed with cardiology Dr. Welton Flakes--- recommends proceeding with surgery   Uncontrolled type II diabetes with peripheral neuropathy and CKD stage IIIB -- baseline creatinine around 1.4 -- creatinine 2.02 -- continue IV insulin and sliding scale, add Aspart tid. Pt counselled on carb control diet -- patient at home on Lantus, metformin,Trulicity --A1c 6.9%   Hypotension with syncopal episode in the setting of pre-renal azotemia in post-op period -- CT head negative -- CT chest negative for PE -- blood pressure improved with IV fluids   CKD stage IIIB -- as above   Hyperlipidemia -- continue statins      Advance Care Planning:   Code Status: Full Code   Consults: Podiatry  Family Communication:  wife at bedside DVT Prophylaxis :lovenox Level of care: Med-Surg Status is: Inpatient Remains  inpatient appropriate because: foot infection    TOTAL TIME TAKING CARE OF THIS PATIENT: 35 minutes.  >50% time spent on counselling and coordination of care  Note: This dictation was prepared with Dragon dictation along with smaller phrase technology. Any transcriptional errors that result from this process are unintentional.  Enedina Finner M.D    [facility]   CC: Primary care physician; Lynnea Ferrier, MD

## 2022-08-19 NOTE — Anesthesia Preprocedure Evaluation (Addendum)
Anesthesia Evaluation  Patient identified by MRN, date of birth, ID band Patient awake    Reviewed: Allergy & Precautions, NPO status , Patient's Chart, lab work & pertinent test results  History of Anesthesia Complications Negative for: history of anesthetic complications  Airway Mallampati: III   Neck ROM: Full    Dental  (+) Missing   Pulmonary COPD, Current Smoker (occasional cigar) and Patient abstained from smoking.   Pulmonary exam normal breath sounds clear to auscultation       Cardiovascular hypertension, + CAD (s/p 4V CABG 2014)  Normal cardiovascular exam Rhythm:Regular Rate:Normal  ECG 08/17/22: Atrial fibrillation Low voltage, precordial leads Borderline T abnormalities, diffuse leads   Neuro/Psych  Neuromuscular disease (diabetic peripheral neuropathy)    GI/Hepatic negative GI ROS,,,  Endo/Other  diabetes, Type 2  Obesity   Renal/GU Renal disease (stage III CKD)     Musculoskeletal Gout    Abdominal   Peds  Hematology negative hematology ROS (+)   Anesthesia Other Findings Last dose of Trulicity 08/13/22.   From hospitalist Dr. Eliane Decree note 08/19/22:  Atrial fibrillation, New, rate controlled H/o CAD,s/p CABG x1 in 2014 --repeat EKG showed Afib -- patient denies chest pain shortness of breath and palpitations -anesthesia requires cardiology clearance. -- Discussed with cardiology Dr. Welton Flakes--- recommends proceeding with surgery   Reproductive/Obstetrics                             Anesthesia Physical Anesthesia Plan  ASA: 3  Anesthesia Plan: General   Post-op Pain Management:    Induction: Intravenous  PONV Risk Score and Plan: 1 and Propofol infusion, TIVA and Treatment may vary due to age or medical condition  Airway Management Planned: Natural Airway  Additional Equipment:   Intra-op Plan:   Post-operative Plan:   Informed Consent: I have reviewed the  patients History and Physical, chart, labs and discussed the procedure including the risks, benefits and alternatives for the proposed anesthesia with the patient or authorized representative who has indicated his/her understanding and acceptance.       Plan Discussed with: CRNA  Anesthesia Plan Comments: (LMA/GETA backup discussed.  Patient consented for risks of anesthesia including but not limited to:  - adverse reactions to medications - damage to eyes, teeth, lips or other oral mucosa - nerve damage due to positioning  - sore throat or hoarseness - damage to heart, brain, nerves, lungs, other parts of body or loss of life  Informed patient about role of CRNA in peri- and intra-operative care.  Patient voiced understanding.)        Anesthesia Quick Evaluation

## 2022-08-19 NOTE — H&P (Signed)
HISTORY AND PHYSICAL INTERVAL NOTE:  08/19/2022  12:49 PM  Andrew Harding  has presented today for surgery, with the diagnosis of gas gangrene Left foot, cellulitis left foot, osteomyelitis left foot.  The various methods of treatment have been discussed with the patient.  No guarantees were given.  After consideration of risks, benefits and other options for treatment, the patient has consented to surgery.  I have reviewed the patients' chart and labs.    PROCEDURE: LEFT TRANSMETATARSAL AMPUTATION LEFT FOOT AND ANKLE INCISION AND DRAINAGE WITH REMOVAL OF ALL NONVIABLE AND NECROTIC TISSUE INCLUDING BONE POSSIBLE ANTIBIOTIC BEAD APPLICATION  A history and physical examination was performed in the hospital.  The patient was reexamined.  There have been no changes to this history and physical examination.  Rosetta Posner, DPM

## 2022-08-19 NOTE — Anesthesia Postprocedure Evaluation (Signed)
Anesthesia Post Note  [name]  Procedure(s) Performed: TRANSMETATARSAL AMPUTATION LEFT FOOT WITH IRRIGATION AND DEBRIDEMENT (Left: Foot)  Patient location during evaluation: PACU Anesthesia Type: General Level of consciousness: awake and alert, oriented and patient cooperative Pain management: pain level controlled Vital Signs Assessment: post-procedure vital signs reviewed and stable Respiratory status: spontaneous breathing, nonlabored ventilation and respiratory function stable Cardiovascular status: blood pressure returned to baseline and stable Postop Assessment: adequate PO intake Anesthetic complications: no   No notable events documented.   Last Vitals:  Vitals:   08/19/22 1630 08/19/22 1645  BP: (!) 104/56 108/72  Pulse: 94 88  Resp: 14 (!) 21  Temp:    SpO2: 97% 97%    Last Pain:  Vitals:   08/19/22 1630  TempSrc:   PainSc: 0-No pain                 Reed Breech

## 2022-08-19 NOTE — Consult Note (Signed)
Andrew Harding is a 72 y.o. male  [id number]  Primary Cardiologist: [facility] cardiology Reason for Consultation: New onset atrial fibrillation/podiatry surgery  HPI: This [age]-year-old white male with multiple medical problems including peripheral vascular disease diabetes with complications and diabetic neuropathy along with peripheral neuropathy requiring foot surgery and was found to have new onset atrial fibrillation.  Patient apparently had a EKG in February 2024 which showed sinus rhythm and the EKG on this admission shows atrial fibrillation with controlled ventricular rate.  Patient denies any chest pain shortness of breath orthopnea PND or leg swelling or palpitation.  Patient does have history of having syncope and recently but has been related to hypotension as he was not eating well.   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     betamethasone dipropionate 0.05 % cream Apply topically 2 (two) times daily. 30 g 0   [EXPIRED] cefdinir (OMNICEF) 300 MG capsule Take 300 mg by mouth 2 (two) times daily.     cholecalciferol (VITAMIN D) 1000 units tablet Take 2,000 Units by mouth daily.     gabapentin (NEURONTIN) 300 MG capsule Take 300 mg by mouth 3 (three) times daily.     Insulin Glargine (BASAGLAR KWIKPEN) 100 UNIT/ML Inject into the skin daily. 15 -units in am, 18 units lunch, 20 supper     insulin glargine (LANTUS) 100 UNIT/ML  injection Inject 52 Units into the skin at bedtime.     lisinopril (ZESTRIL) 20 MG tablet Take 20 mg by mouth daily.     metFORMIN (GLUCOPHAGE-XR) 500 MG 24 hr tablet 500 mg 2 (two) times daily with a meal.     NOVOLOG FLEXPEN 100 UNIT/ML FlexPen Inject 15-20 Units into the skin 3 (three) times daily with meals.     pravastatin (PRAVACHOL) 80 MG tablet TAKE ONE TABLET BY MOUTH AT NIGHT     silver sulfADIAZINE (SILVADENE) 1 % cream Apply topically.     traZODone (DESYREL) 50 MG tablet TAKE ONE TABLET AT BEDTIME AS NEEDED     TRULICITY 4.5 MG/0.5ML SOPN Inject into the skin.     glucose blood (ONETOUCH ULTRA) test strip Use 3 (three) times daily     Insulin Pen Needle (FIFTY50 PEN NEEDLES) 32G X 4 MM MISC USE 4 TIMES DAILY        aspirin EC  81 mg Oral Daily   cholecalciferol  2,000 Units Oral Daily   docusate sodium  100 mg Oral BID   enoxaparin (LOVENOX) injection  0.5 mg/kg Subcutaneous Q24H   feeding supplement  237 mL Oral BID BM   gabapentin  300 mg Oral TID   insulin aspart  0-5 Units Subcutaneous QHS   insulin aspart  0-9 Units Subcutaneous TID WC   insulin aspart  4 Units Subcutaneous TID WC   insulin glargine-yfgn  50 Units Subcutaneous QHS   pravastatin  80 mg Oral q1800    Infusions:  sodium chloride 75 mL/hr at 08/19/22 0912   ceFEPime (MAXIPIME) IV 200 mL/hr at 08/19/22 0100   vancomycin 2,000 mg (08/19/22 1029)    No Known Allergies  Social History   Socioeconomic History   Marital status: Married    Spouse name: Ashley,Angela C   Number of children: Not on file   Years of education: Not on file   Highest education level: Not on file  Occupational History   Not on file  Tobacco Use   Smoking status: Some Days    Types: Cigars   Smokeless tobacco: Never  Vaping Use   Vaping Use: Never used  Substance and Sexual Activity   Alcohol use: No   Drug use: No   Sexual activity: Never  Other Topics Concern   Not on file  Social History Narrative   Lives at  home by himself. Independent at baseline   Social Determinants of Health   Financial Resource Strain: Not on file  Food Insecurity: Not on file  Transportation Needs: Not on file  Physical Activity: Not on file  Stress: Not on file  Social Connections: Not on file  Intimate Partner Violence: Not on file    Family History  Problem Relation Age of Onset   Diabetes Mellitus II Mother    CAD Mother     PHYSICAL EXAM: Vitals:   08/19/22 0330 08/19/22 0748  BP: 117/72 (!) 105/58  Pulse: 89 66  Resp: 20 20  Temp: 99.2 F (37.3 C) 99.9 F (37.7 C)  SpO2: 98% 94%     Intake/Output Summary (Last 24 hours) at 08/19/2022 1212 Last data filed at 08/19/2022 0100 Gross per 24 hour  Intake 1032.43 ml  Output --  Net 1032.43 ml    General:  Well appearing. No respiratory difficulty HEENT: normal Neck: supple. no JVD. Carotids 2+ bilat; no bruits. No lymphadenopathy or thryomegaly appreciated. Cor: PMI nondisplaced. Regular rate & rhythm. No rubs, gallops or murmurs. Lungs: clear Abdomen: soft, nontender, nondistended. No hepatosplenomegaly. No bruits or masses. Good bowel sounds. Extremities: no cyanosis, clubbing, rash, edema Neuro: alert & oriented x 3, cranial nerves grossly intact. moves all 4 extremities w/o difficulty. Affect pleasant.  ECG: Atrial fibrillation with controlled ventricular rate about 75-80  Results for orders placed or performed during the hospital encounter of 08/17/22 (from the past 24 hour(s))  Glucose, capillary     Status: Abnormal   Collection Time: 08/18/22  4:18 PM  Result Value Ref Range   Glucose-Capillary 214 (H) 70 - 99 mg/dL  Glucose, capillary     Status: Abnormal   Collection Time: 08/18/22  9:57 PM  Result Value Ref Range   Glucose-Capillary 255 (H) 70 - 99 mg/dL  Basic metabolic panel     Status: Abnormal   Collection Time: 08/19/22  4:53 AM  Result Value Ref Range   Sodium 133 (L) 135 - 145 mmol/L   Potassium 4.0 3.5 - 5.1 mmol/L    Chloride 103 98 - 111 mmol/L   CO2 21 (L) 22 - 32 mmol/L   Glucose, Bld 254 (H) 70 - 99 mg/dL   BUN 19 8 - 23 mg/dL   Creatinine, Ser 4.09 (H) 0.61 - 1.24 mg/dL   Calcium 8.0 (L) 8.9 - 10.3 mg/dL   GFR, Estimated 49 (L) >60 mL/min   Anion gap 9  5 - 15  CBC     Status: Abnormal   Collection Time: 08/19/22  4:53 AM  Result Value Ref Range   WBC 12.2 (H) 4.0 - 10.5 K/uL   RBC 2.98 (L) 4.22 - 5.81 MIL/uL   Hemoglobin 9.1 (L) 13.0 - 17.0 g/dL   HCT 40.9 (L) 81.1 - 91.4 %   MCV 92.3 80.0 - 100.0 fL   MCH 30.5 26.0 - 34.0 pg   MCHC 33.1 30.0 - 36.0 g/dL   RDW 78.2 95.6 - 21.3 %   Platelets 269 150 - 400 K/uL   nRBC 0.0 0.0 - 0.2 %  Sedimentation rate     Status: Abnormal   Collection Time: 08/19/22  4:53 AM  Result Value Ref Range   Sed Rate 128 (H) 0 - 20 mm/hr  Glucose, capillary     Status: Abnormal   Collection Time: 08/19/22  7:51 AM  Result Value Ref Range   Glucose-Capillary 198 (H) 70 - 99 mg/dL  Comprehensive metabolic panel     Status: Abnormal   Collection Time: 08/19/22 11:23 AM  Result Value Ref Range   Sodium 134 (L) 135 - 145 mmol/L   Potassium 3.9 3.5 - 5.1 mmol/L   Chloride 104 98 - 111 mmol/L   CO2 23 22 - 32 mmol/L   Glucose, Bld 157 (H) 70 - 99 mg/dL   BUN 18 8 - 23 mg/dL   Creatinine, Ser 0.86 (H) 0.61 - 1.24 mg/dL   Calcium 8.1 (L) 8.9 - 10.3 mg/dL   Total Protein 6.6 6.5 - 8.1 g/dL   Albumin 2.4 (L) 3.5 - 5.0 g/dL   AST 578 (H) 15 - 41 U/L   ALT 153 (H) 0 - 44 U/L   Alkaline Phosphatase 127 (H) 38 - 126 U/L   Total Bilirubin 0.9 0.3 - 1.2 mg/dL   GFR, Estimated 49 (L) >60 mL/min   Anion gap 7 5 - 15  CBC     Status: Abnormal   Collection Time: 08/19/22 11:23 AM  Result Value Ref Range   WBC 13.0 (H) 4.0 - 10.5 K/uL   RBC 3.09 (L) 4.22 - 5.81 MIL/uL   Hemoglobin 9.5 (L) 13.0 - 17.0 g/dL   HCT 46.9 (L) 62.9 - 52.8 %   MCV 92.6 80.0 - 100.0 fL   MCH 30.7 26.0 - 34.0 pg   MCHC 33.2 30.0 - 36.0 g/dL   RDW 41.3 24.4 - 01.0 %   Platelets 276 150 -  400 K/uL   nRBC 0.0 0.0 - 0.2 %  Glucose, capillary     Status: Abnormal   Collection Time: 08/19/22 11:49 AM  Result Value Ref Range   Glucose-Capillary 137 (H) 70 - 99 mg/dL   MR FOOT LEFT WO CONTRAST  Result Date: 08/18/2022 CLINICAL DATA:  Foot swelling, diabetic, osteomyelitis suspected, xray done EXAM: MRI OF THE LEFT FOOT WITHOUT CONTRAST TECHNIQUE: Multiplanar, multisequence MR imaging of the left forefoot was performed. No intravenous contrast was administered. COMPARISON:  None Available. FINDINGS: Bones/Joint/Cartilage Postsurgical changes of amputation of the second and third toes at the metatarsophalangeal joint level. Prior fourth toe amputation through the base of the proximal phalanx. Surgical defect or focal erosion involving the distal aspect of the residual fourth toe proximal phalanx (series 9, image 14). There is periosteal edema of the second and third metatarsal heads with relatively mild bone marrow edema. Prominent bone marrow edema within the first and third metatarsal diaphyses and at the proximal metaphysis of  the second metatarsal. No well-defined fracture line. Normal alignment. Relatively mild degenerative changes. Ligaments Intact Lisfranc ligament. Remaining collateral ligaments are intact. Muscles and Tendons Chronic denervation changes of the foot musculature. Amputation changes of the musculotendinous structures of the second through fourth toes. No tenosynovitis. Soft tissues Soft tissue wound or ulceration at the dorsal aspect of the forefoot overlying the second metatarsal head. Additional shallow wounds or ulcerations dorsal to the third and fourth metatarsal heads. Extensive soft tissue edema. No organized or drainable fluid collections. IMPRESSION: 1. Postsurgical changes of amputation of the second through fourth toes. Soft tissue wounds or ulcerations at the dorsal aspect of the forefoot overlying the second metatarsal head. Additional shallow wounds or  ulcerations dorsal to the third and fourth metatarsal heads. 2. Small postsurgical defect or focal erosion involving the distal aspect of the residual fourth toe proximal phalanx. Appearance is suspicious for osteomyelitis. 3. Periosteal edema of the second and third metatarsal heads with relatively mild bone marrow edema. Findings are suspicious for early acute osteomyelitis. 4. Prominent bone marrow edema within the first and third metatarsal diaphyses and at the proximal metaphysis of the second metatarsal. No well-defined fracture line. Findings are favored to represent stress-related changes. 5. Extensive soft tissue edema. No organized or drainable fluid collections. Electronically Signed   By: Duanne Guess D.O.   On: 08/18/2022 20:24   CT Angio Chest PE W and/or Wo Contrast  Result Date: 08/17/2022 CLINICAL DATA:  Sepsis. EXAM: CT ANGIOGRAPHY CHEST WITH CONTRAST TECHNIQUE: Multidetector CT imaging of the chest was performed using the standard protocol during bolus administration of intravenous contrast. Multiplanar CT image reconstructions and MIPs were obtained to evaluate the vascular anatomy. RADIATION DOSE REDUCTION: This exam was performed according to the departmental dose-optimization program which includes automated exposure control, adjustment of the mA and/or kV according to patient size and/or use of iterative reconstruction technique. CONTRAST:  60mL OMNIPAQUE IOHEXOL 350 MG/ML SOLN COMPARISON:  Chest x-ray 08/17/2022 earlier FINDINGS: Cardiovascular: No pulmonary embolism identified. Status post median sternotomy. Heart is nonenlarged. No significant pericardial effusion. The thoracic aorta has a normal course and caliber with mild atherosclerotic calcified plaque. Coronary artery calcifications are noted. 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 Mediastinum/Nodes: Mildly patulous esophagus. No specific abnormal lymph node enlargement seen in the axillary region, hilum or  mediastinum. Lungs/Pleura: Mild diffuse breathing motion. No consolidation, pneumothorax or effusion. Dependent atelectasis. 3 mm nodule right upper lobe on series 5, image 77 is noncalcified. Similar focus on image 56 but this has a component of calcification peripherally. Upper Abdomen: Gallstones in the nondilated gallbladder. There is dystrophic calcification along the left adrenal gland. Please correlate with any previous infectious, inflammatory or traumatic process. This was seen on the CT scan of 2014 of the abdomen and pelvis. Musculoskeletal: Mild degenerative changes along the spine. Review of the MIP images confirms the above findings. IMPRESSION: No pulmonary embolism identified.  Mild breathing motion. Postop chest. Gallstones. 3 mm lung nodules. No follow-up needed if patient is low-risk (and has no known or suspected primary neoplasm). Non-contrast chest CT can be considered in 12 months if patient is high-risk. This recommendation follows the consensus statement: Guidelines for Management of Incidental Pulmonary Nodules Detected on CT Images: From the Fleischner Society 2017; Radiology 2017; 284:228-243. Aortic Atherosclerosis (ICD10-I70.0). Electronically Signed   By: Karen Kays M.D.   On: 08/17/2022 13:43   CT HEAD WO CONTRAST ( )  Result Date: 08/17/2022 CLINICAL DATA:  Possible wound infection  to left foot EXAM: CT HEAD WITHOUT CONTRAST TECHNIQUE: Contiguous axial images were obtained from the base of the skull through the vertex without intravenous contrast. RADIATION DOSE REDUCTION: This exam was performed according to the departmental dose-optimization program which includes automated exposure control, adjustment of the mA and/or kV according to patient size and/or use of iterative reconstruction technique. COMPARISON:  None Available. FINDINGS: Brain: No evidence of acute infarction, hemorrhage, mass, mass effect, or midline shift. No hydrocephalus or extra-axial fluid collection.  Vascular: No hyperdense vessel. Atherosclerotic calcifications in the intracranial carotid and vertebral arteries. Skull: Negative for fracture or focal lesion. Sinuses/Orbits: Mucosal thickening in the ethmoid air cells and left maxillary sinus. No acute finding in the orbits. Status post bilateral lens replacements. Other: The mastoid air cells are well aerated. IMPRESSION: No acute intracranial process. Electronically Signed   By: Wiliam Ke M.D.   On: 08/17/2022 13:35     ASSESSMENT AND PLAN: New onset atrial fibrillation with controlled ventricular rate compared to February EKG EKG now shows atrial fibrillation.  Patient has history of CABG but denies any chest pain shortness of breath or palpitation.  Surgery is urgently needed thus advise proceeding with surgery.  Will need anticoagulation after surgery but at this time I do not think any rate control medications such as metoprolol or any antiarrhythmic drugs like IV amiodarone are needed unless ventricular rate goes up.  Advise proceeding with surgery, if needed.  Rainey Kahrs [place] Flakes

## 2022-08-19 NOTE — Op Note (Signed)
PODIATRY / FOOT AND ANKLE SURGERY OPERATIVE REPORT    SURGEON: Rosetta Posner, DPM  PRE-OPERATIVE DIAGNOSIS:  1.  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  Diabetes type 2 polyneuropathy 3.  PVD  POST-OPERATIVE DIAGNOSIS: Same  PROCEDURE(S): Left foot transmetatarsal amputation Left foot and ankle incision and drainage with removal of all nonviable necrotic tissue Application of antibiotic beads  HEMOSTASIS: Left ankle tourniquet  ANESTHESIA: MAC  ESTIMATED BLOOD LOSS: 200 cc  FINDING(S): 1.  Gas gangrene left forefoot with osteomyelitis and abscess tracking up flexor tendon sheath about the medial foot  PATHOLOGY/SPECIMEN(S): Left forefoot path specimen  INDICATIONS:   Andrew Harding is a 72 y.o. male who presents with infection to the left forefoot.  Patient had x-ray imaging on outpatient basis with Dr. Ether Griffins which did not reveal any signs of infection but patient had increased erythema and edema present to the foot and patient was generally not feeling well so he was sent to the hospital.  Patient had MRI imaging and further x-ray imaging which did reveal gas gangrene to the tissues to the left forefoot extending to the midfoot along with possible osteomyelitis to the second and third metatarsal heads.  Patient had been treated with antibiotics for a while but still continued to not improve despite conservative measures.  All treatment options were discussed with the patient both conservative and surgical attempts at correction include potential risks and complications at this time patient is elected for surgical intervention consisting of left foot transmetatarsal amputation with incision and drainage of left foot and ankle with removal of all nonviable necrotic tissues and application of antibiotic beads.  DESCRIPTION: After obtaining full informed written consent, the patient was brought  back to the operating room and placed supine upon the operating table.  The patient received IV antibiotics prior to induction.  After obtaining adequate anesthesia, the patient was prepped and draped in the standard fashion.  20 cc of half percent Marcaine plain was injected about the left ankle and a left ankle block.  At this time the incision was then marked out for the transmetatarsal amputation around the left forefoot to the midsection of the metatarsals keeping slightly more tissue plantarly than dorsally.  The incision was made straight to bone.  There appeared to be a severe amount of bleeding with this so an Esmarch bandage was used to exsanguinate the left lower extremity and the pneumatic ankle tourniquet was inflated.  Despite the tourniquet there still appeared to be a a lot of bleeding so the tourniquet was let down because it seemed to be more of a venous tourniquet.  Once this was let down hemostasis was then more controlled and able to be controlled with electrocauterization and 3-0 Vicryl tying off calcified vessels.  An extensor tenotomy and capsulotomy was performed to the first and fifth toes at the metatarsal phalangeal joints followed by release the collateral and suspensory ligaments as well as any connection to the plantar plate or flexor tendon.  The first and fifth toes were passed off the operative site along with the skin flap.  Circumferential dissection was then continued around the metatarsals 1 through 5.  There appeared to be foul odor with purulent discharge coming from the second and third metatarsal heads which appeared to be gray and dusky and purulent drainage appear to be expressible following the flexor tendons of the second and third metatarsals.  The first, second,  third, fourth, fifth metatarsals were all resected at the midshaft portion with the appropriate beveling and passed off the operative site.  The digits and flap as well as the metatarsals were passed off the  operative site and sent off to pathology.  The surgical site was flushed with copious amounts normal sterile saline.  Hemostasis once again appeared to be fairly well achieved overall with electrocauterization.  The tissues plantarly appeared to be dusky and nonviable underneath the second and third metatarsal heads.  These tissues were resected as far proximally as possible.  The area was exsanguinated and still appeared to have pus coming from the midfoot portion.  At this time secondary incision was then made through the skin into the subcutaneous tissue at the mid arch portion of the foot following the flexor tendon sheath.  A hemostat was placed through the transmetatarsal amputation site incision following the flexor tendon sheath to the mid arch portion.  This helped guide dissection deeper through the plantar fascia and into the plantar musculature.  There appeared to be a fair amount of purulence to this area as well which was all expressed.  The tissues were debrided at this level removing nonviable necrotic tissues to the area.  Any nonviable necrotic tissue at both incision sites was resected and passed off the operative site.  The tissues appeared to be fairly dusky at the plantar aspect of the foot starting from the plantar flap into the plantar midfoot.  These tissues were resected with a combination of 15 blade scissors and Versajet to somewhat more healthy tissue but did not appear to have much bleeding to the area.  A hemostat was then used to plane through multiple areas of the tissues at the plantar medial arch as well as plantar forefoot area.  The dorsal forefoot also had this area dissected but did not appear to have any purulent discharge or gas dorsally in the foot and all appear to be coming from the plantar surface.  There appeared to be some more purulence still coming from the flexor tendon sheath even past the midfoot extending up into the ankle so at this time another additional  incision was made up at the ankle joint level after hemostat was placed into the flexor tendon sheath and was able to be palpated at the medial ankle.  An incision was then made over this area separately at the ankle joint level.  Dissection was carefully taken down to the flexor retinaculum.  The flexor retinaculum was then incised and opened up and the flexor tendon area was examined.  There did not appear to be any disease at this level and the tissues appeared to be healthy overall with no purulent discharge.  The surgical sites were all flushed with copious amounts normal sterile saline utilizing the pulse lavage.  Any further nonviable necrotic tissues were resected and passed off the operative site and also Versajet was used at the areas as well to debride the tissues even further.  Overall the plantar skin flap still appeared to have capillary fill time intact but it still appeared to be slightly dusky but was left for purposes of closure.  If this tissue does die later on may need further debridement/resection and revisional transmetatarsal amputation.  There did not appear to be any further purulent discharge at the level of the procedure site.  The tissues appeared to be much more healthy at this time but still had a minimal amount of bleeding in general.  All surgical  sites were then packed with iodoform packing gauze as well as antibiotic beads with vancomycin impregnated.  Some of the subcutaneous tissue at the level of transmetatarsal amputation site was reapproximated well coapted with 3-0 Vicryl.  The skin was then reapproximated well coapted for the most part at all incision sites with 3-0 nylon and skin stapler.  A couple portions of the incision sites were left open for packing purposes and to drain further infection.  A postoperative dressing was then applied consisting of Betadine soaked gauze to the area of the incisions followed by 4 x 4 gauze, ABD, Kerlix, Ace wrap.  The patient  tolerated the procedure and anesthesia well and was transferred to the recovery room with vital signs stable vascular status appearing to be intact to the left foot but still had some dusky changes to the plantar flap.  Following a period of postoperative monitoring the patient be discharged back to the inpatient room with the appropriate orders and instructions.  Patient is to remain nonweightbearing at all times left lower extremity.  Vascular team has been consulted for further assessment as well as infectious disease.  Infectious disease will likely not see until Monday.  Discussed with family and also discussed with patient prior to surgical intervention that he is at high risk for limb loss due to severity of infection.  Will continue to monitor closely over the next couple of days.   COMPLICATIONS: None  CONDITION: Stable  Rosetta Posner, DPM

## 2022-08-19 NOTE — Anesthesia Procedure Notes (Signed)
Date/Time: 08/19/2022 1:10 PM  Performed by: Stormy Fabian, CRNAPre-anesthesia Checklist: Patient identified, Emergency Drugs available, Suction available and Patient being monitored Patient Re-evaluated:Patient Re-evaluated prior to induction Oxygen Delivery Method: Nasal cannula Induction Type: IV induction Dental Injury: Teeth and Oropharynx as per pre-operative assessment  Comments: Nasal cannula with etCO2 monitoring

## 2022-08-19 NOTE — Progress Notes (Signed)
Spoke on the phone with Dr. Allena Katz regarding patient shivering/shaking at shift change. Patient is afebrile and vitals are normal at this time. Patient given warm blanket and temperature increased in the room. Per Dr. Allena Katz; no new labs needed right now; monitor overnight and review AM labs; watch vitals overnight.

## 2022-08-19 NOTE — Transfer of Care (Signed)
Immediate Anesthesia Transfer of Care Note  [name]  Procedure(s) Performed: Procedure(s): TRANSMETATARSAL AMPUTATION LEFT FOOT WITH IRRIGATION AND DEBRIDEMENT (Left)  Patient Location: PACU  Anesthesia Type:General  Level of Consciousness: sedated  Airway & Oxygen Therapy: Patient Spontanous Breathing and Patient connected to face mask oxygen  Post-op Assessment: Report given to RN and Post -op Vital signs reviewed and stable  Post vital signs: Reviewed and stable  Last Vitals:  Vitals:   08/19/22 0748 08/19/22 1530  BP: (!) 105/58 (!) 113/97  Pulse: 66 (!) 102  Resp: 20 15  Temp: 37.7 C   SpO2: 94% 98%    Complications: No apparent anesthesia complications

## 2022-08-19 NOTE — Progress Notes (Signed)
Pharmacy Antibiotic Note  Andrew Harding is a 72 y.o. male admitted on 08/17/2022 with  wound infection/diabetic foot .  Pharmacy has been consulted for Cefepime and Vancomycin dosing.  MD called pharmacy today to request adding MRSA coverage to regimen due to gram positive organism in culture. Will consult ID for further Abx management.  Plan: Cefepime 2gm q 12 hours (therapy escalated on 4/19 due to prior hx of Pseudomonas infection) Vancomycin 2000 mg x 1 , then  IV q 24hrs Goal AUC 400-550. Expected AUC: 460 Scr 1.50 (Bl ~1.4) Css min: 11   Height:  (190.5 cm) Weight: 113.4 kg (250 lb) IBW/kg (Calculated) : 84.5  Temp (24hrs), Avg:100.5 F (38.1 C), Min:99.1 F (37.3 C), Max:103.2 F (39.6 C)  Recent Labs  Lab 08/17/22 0944 08/17/22 1823 08/19/22 0453  WBC 15.9*  --  12.2*  CREATININE 2.02*  --  1.50*  LATICACIDVEN 1.7 1.8  --     Estimated Creatinine Clearance: 59.6 mL/min (A) (by C-G formula based on SCr of 1.5 mg/dL (H)).    No Known Allergies  Antimicrobials this admission: 4/18 Ceftriaxone >> 4/19 4/18 Vancomycin  IV x 1 4/19 Cefepime 2gm >> 4/20 Vancomycin >>  Dose adjustments this admission: n/a  Microbiology results: 4/18 BCx: NG 4/19 Wound (left toe), Gram neg rods and gram positive cocci  Thank you for allowing pharmacy to be a part of this patient's care.  Lyn Joens Rodriguez-Guzman PharmD, BCPS 08/19/2022 9:06 AM

## 2022-08-19 NOTE — Progress Notes (Addendum)
PODIATRY / FOOT AND ANKLE SURGERY PROGRESS NOTE  Requesting Physician: Dr. Allena Katz  Reason for consult: Left foot infection  Chief Complaint: infection   HPI: Andrew Harding is a 72 y.o. male who presents today resting in bed fairly comfortably.  Patient still has some pain around the medial ankle and foot area.  Patient is kept his dressings clean and dry since yesterday night.  Overall he states that he is feeling better but still has run fevers overnight.  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   Tubular adenoma    Vitamin D deficiency     Surgical Hx:  Past Surgical History:  Procedure Laterality Date   AMPUTATION TOE Right 06/01/2015   Procedure: AMPUTATION TOE;  Surgeon: Recardo Evangelist, DPM;  Location: [facility] ORS;  Service: Podiatry;  Laterality: Right;   AMPUTATION TOE Left 08/11/2022   Procedure: AMPUTATION TOE 2, 3, 4;  Surgeon: [name], DPM;  Location: [facility] ORS;  Service: Podiatry;  Laterality: Left;   CATARACT EXTRACTION, BILATERAL     CIRCUMCISION N/A 06/12/2022   Procedure: CIRCUMCISION ADULT;  Surgeon: Vanna Scotland, MD;  Location: [facility] ORS;  Service: Urology;  Laterality: N/A;   COLONOSCOPY WITH PROPOFOL N/A 02/14/2016   Procedure: COLONOSCOPY WITH PROPOFOL;  Surgeon: Christena Deem, MD;  Location: [facility] ENDOSCOPY;  Service: Endoscopy;  Laterality: N/A;   COLONOSCOPY WITH PROPOFOL N/A 01/07/2019   Procedure: COLONOSCOPY WITH PROPOFOL;  Surgeon: Christena Deem, MD;  Location: [facility] ENDOSCOPY;  Service: Endoscopy;  Laterality: N/A;   CORONARY ARTERY BYPASS GRAFT N/A 08/2012   EXCISION PARTIAL PHALANX  Right 06/01/2015   Procedure: EXCISION PARTIAL PHALANX /  BONE;  Surgeon: Recardo Evangelist, DPM;  Location: [facility] ORS;  Service: Podiatry;  Laterality: Right;   FLEXIBLE SIGMOIDOSCOPY N/A 05/29/2016   Procedure: FLEXIBLE SIGMOIDOSCOPY;  Surgeon: Christena Deem, MD;  Location: [facility] ENDOSCOPY;  Service: Endoscopy;  Laterality: N/A;   KNEE ARTHROSCOPY Left     FHx:  Family History  Problem Relation Age of Onset   Diabetes Mellitus II Mother    CAD Mother     Social History:  reports that he has been smoking cigars. He has never used smokeless tobacco. He reports that he does not drink alcohol and does not use drugs.  Allergies: No Known Allergies  Medications Prior to Admission  Medication Sig Dispense Refill   aspirin EC 81 MG tablet Take 81 mg by mouth daily.     betamethasone dipropionate 0.05 % cream Apply topically 2 (two) times daily. 30 g 0   [EXPIRED] cefdinir (OMNICEF) 300 MG capsule Take 300 mg by mouth 2 (two) times daily.     cholecalciferol (VITAMIN D) 1000 units tablet Take 2,000 Units by mouth daily.     gabapentin (NEURONTIN) 300 MG capsule Take 300 mg by mouth 3 (three) times daily.     Insulin Glargine (BASAGLAR KWIKPEN) 100 UNIT/ML Inject into the skin daily. 15 -units in am, 18 units lunch, 20 supper     insulin glargine (LANTUS)  100 UNIT/ML injection Inject 52 Units into the skin at bedtime.     lisinopril (ZESTRIL) 20 MG tablet Take 20 mg by mouth daily.     metFORMIN (GLUCOPHAGE-XR) 500 MG 24 hr tablet 500 mg 2 (two) times daily with a meal.     NOVOLOG FLEXPEN 100 UNIT/ML FlexPen Inject 15-20 Units into the skin 3 (three) times daily with meals.     pravastatin (PRAVACHOL) 80 MG tablet TAKE ONE TABLET BY MOUTH AT NIGHT     silver sulfADIAZINE (SILVADENE) 1 % cream Apply topically.     traZODone (DESYREL) 50 MG tablet TAKE ONE TABLET AT BEDTIME AS NEEDED     TRULICITY 4.5 MG/0.5ML SOPN Inject into the skin.     glucose blood (ONETOUCH ULTRA) test strip Use 3  (three) times daily     Insulin Pen Needle (FIFTY50 PEN NEEDLES) 32G X 4 MM MISC USE 4 TIMES DAILY      Physical Exam: General: Alert and oriented.  No apparent distress.  Vascular: DP/PT pulses palpable bilateral, capillary fill time intact to digits and flap of left foot.  Increased redness and swelling present to the left forefoot at the level of the amputation sites.  Neuro: Light touch sensation reduced to bilateral lower extremities.  Derm: 3 incisions present to previous toe amputations of second, third, fourth toes.  Patient appears to have erythema surrounding all incisions with now erythema extending up the plantar foot into the medial arch area, patient has pain on palpation to this area at the medial arch extending to the medial ankle, no obvious fluctuance upon palpation to the area today but appears that infection is traveling up the flexor tendon sheath.  Upon further examination of the left second toe incision was able to exsanguinate a fair amount of purulent drainage from this area.  Able to see the tissues to the area as well as second metatarsal head, second metatarsal head appears to be fairly healthy but surrounding tissues appear to be fibronecrotic.      MSK: Left second, third, fourth toe amputations.  Right hallux amputation.  No obvious fluctuance but patient does have pain on palpation to the left medial ankle.  Results for orders placed or performed during the hospital encounter of 08/17/22 (from the past 48 hour(s))  Troponin I (High Sensitivity)     Status: None   Collection Time: 08/17/22 12:19 PM  Result Value Ref Range   Troponin I (High Sensitivity) 13 <18 ng/L    Comment: (NOTE) Elevated high sensitivity troponin I (hsTnI) values and significant  changes across serial measurements may suggest ACS but many other  chronic and acute conditions are known to elevate hsTnI results.  Refer to the "Links" section for chest pain algorithms and additional   guidance. Performed at [facility], [address] [place], [place] [address]   CK     Status: Abnormal   Collection Time: 08/17/22 12:19 PM  Result Value Ref Range   Total CK 43 (L) 49 - 397 U/L    Comment: Performed at [facility], [address] [place], [place] [address]  CBG monitoring, ED     Status: Abnormal   Collection Time: 08/17/22  4:14 PM  Result Value Ref Range   Glucose-Capillary 166 (H) 70 - 99 mg/dL    Comment: Glucose reference range applies only to samples taken after fasting for at least 8 hours.  Lactic acid, plasma     Status: None   Collection Time:  08/17/22  6:23 PM  Result Value Ref Range   Lactic Acid, Venous 1.8 0.5 - 1.9 mmol/L    Comment: Performed at [facility], [address] [place], [place] [address]  Hemoglobin A1c     Status: Abnormal   Collection Time: 08/17/22  6:23 PM  Result Value Ref Range   Hgb A1c MFr Bld 6.9 (H) 4.8 - 5.6 %    Comment: (NOTE) Pre diabetes:          5.7%-6.4%  Diabetes:              >6.4%  Glycemic control for   <7.0% adults with diabetes    Mean Plasma Glucose 151.33 mg/dL    Comment: Performed at [facility], [address], [place], [place] [address]  Urinalysis, Routine w reflex microscopic -Urine, Clean Catch     Status: Abnormal   Collection Time: 08/17/22  9:00 PM  Result Value Ref Range   Color, Urine YELLOW (A) YELLOW   APPearance HAZY (A) CLEAR   Specific Gravity, Urine 1.041 (H) 1.005 - 1.030   pH 5.0 5.0 - 8.0   Glucose, UA 50 (A) NEGATIVE mg/dL   Hgb urine dipstick SMALL (A) NEGATIVE   Bilirubin Urine NEGATIVE NEGATIVE   Ketones, ur NEGATIVE NEGATIVE mg/dL   Protein, ur 30 (A) NEGATIVE mg/dL   Nitrite NEGATIVE NEGATIVE   Leukocytes,Ua NEGATIVE NEGATIVE   RBC / HPF 6-10 0 - 5 RBC/hpf   WBC, UA 0-5 0 - 5 WBC/hpf   Bacteria, UA RARE (A) NONE SEEN   Squamous Epithelial / HPF 0-5 0 - 5 /HPF   Mucus PRESENT     Comment: Performed at [facility],  [address] [place], [place] [address]  Glucose, capillary     Status: Abnormal   Collection Time: 08/17/22  9:37 PM  Result Value Ref Range   Glucose-Capillary 217 (H) 70 - 99 mg/dL    Comment: Glucose reference range applies only to samples taken after fasting for at least 8 hours.  Glucose, capillary     Status: Abnormal   Collection Time: 08/18/22  8:35 AM  Result Value Ref Range   Glucose-Capillary 213 (H) 70 - 99 mg/dL    Comment: Glucose reference range applies only to samples taken after fasting for at least 8 hours.  Aerobic Culture w Gram Stain (superficial specimen)     Status: None (Preliminary result)   Collection Time: 08/18/22 10:39 AM   Specimen: Wound  Result Value Ref Range   Specimen Description      WOUND Performed at [facility], [address], [place], [place] [address]    Special Requests      LEFT TOE Performed at [facility], [address] [place], [place] [address]    Gram Stain      FEW WBC PRESENT, PREDOMINANTLY PMN MODERATE GRAM NEGATIVE RODS FEW GRAM POSITIVE COCCI Performed at [facility], [address], [place], [place] [address]    Culture PENDING    Report Status PENDING   Glucose, capillary     Status: Abnormal   Collection Time: 08/18/22 11:15 AM  Result Value Ref Range   Glucose-Capillary 234 (H) 70 - 99 mg/dL    Comment: Glucose reference range applies only to samples taken after fasting for at least 8 hours.  Glucose, capillary     Status: Abnormal   Collection Time: 08/18/22  4:18 PM  Result Value Ref Range  Glucose-Capillary 214 (H) 70 - 99 mg/dL    Comment: Glucose reference range applies only to samples taken after fasting for at least 8 hours.  Glucose, capillary     Status: Abnormal   Collection Time: 08/18/22  9:57 PM  Result Value Ref Range   Glucose-Capillary 255 (H) 70 - 99 mg/dL    Comment: Glucose reference range applies only to samples taken after fasting for at least 8 hours.   Basic metabolic panel     Status: Abnormal   Collection Time: 08/19/22  4:53 AM  Result Value Ref Range   Sodium 133 (L) 135 - 145 mmol/L   Potassium 4.0 3.5 - 5.1 mmol/L   Chloride 103 98 - 111 mmol/L   CO2 21 (L) 22 - 32 mmol/L   Glucose, Bld 254 (H) 70 - 99 mg/dL    Comment: Glucose reference range applies only to samples taken after fasting for at least 8 hours.   BUN 19 8 - 23 mg/dL   Creatinine, Ser 0.98 (H) 0.61 - 1.24 mg/dL   Calcium 8.0 (L) 8.9 - 10.3 mg/dL   GFR, Estimated 49 (L) >60 mL/min    Comment: (NOTE) Calculated using the CKD-EPI Creatinine Equation (2021)    Anion gap 9 5 - 15    Comment: Performed at [facility], [address] [place], [place] [address]  CBC     Status: Abnormal   Collection Time: 08/19/22  4:53 AM  Result Value Ref Range   WBC 12.2 (H) 4.0 - 10.5 K/uL   RBC 2.98 (L) 4.22 - 5.81 MIL/uL   Hemoglobin 9.1 (L) 13.0 - 17.0 g/dL   HCT 78.2 (L) 95.6 - 21.3 %   MCV 92.3 80.0 - 100.0 fL   MCH 30.5 26.0 - 34.0 pg   MCHC 33.1 30.0 - 36.0 g/dL   RDW 08.6 57.8 - 46.9 %   Platelets 269 150 - 400 K/uL   nRBC 0.0 0.0 - 0.2 %    Comment: Performed at [facility], [address] [place], [place] [address]  Sedimentation rate     Status: Abnormal   Collection Time: 08/19/22  4:53 AM  Result Value Ref Range   Sed Rate 128 (H) 0 - 20 mm/hr    Comment: Performed at [facility], [address] [place], [place] [address]  Glucose, capillary     Status: Abnormal   Collection Time: 08/19/22  7:51 AM  Result Value Ref Range   Glucose-Capillary 198 (H) 70 - 99 mg/dL    Comment: Glucose reference range applies only to samples taken after fasting for at least 8 hours.   MR FOOT LEFT WO CONTRAST  Result Date: 08/18/2022 CLINICAL DATA:  Foot swelling, diabetic, osteomyelitis suspected, xray done EXAM: MRI OF THE LEFT FOOT WITHOUT CONTRAST TECHNIQUE: Multiplanar, multisequence MR imaging of the left forefoot was performed. No  intravenous contrast was administered. COMPARISON:  None Available. FINDINGS: Bones/Joint/Cartilage Postsurgical changes of amputation of the second and third toes at the metatarsophalangeal joint level. Prior fourth toe amputation through the base of the proximal phalanx. Surgical defect or focal erosion involving the distal aspect of the residual fourth toe proximal phalanx (series 9, image 14). There is periosteal edema of the second and third metatarsal heads with relatively mild bone marrow edema. Prominent bone marrow edema within the first and third metatarsal diaphyses and at the proximal metaphysis of the second metatarsal. No well-defined fracture line. Normal alignment. Relatively mild degenerative changes. Ligaments  Intact Lisfranc ligament. Remaining collateral ligaments are intact. Muscles and Tendons Chronic denervation changes of the foot musculature. Amputation changes of the musculotendinous structures of the second through fourth toes. No tenosynovitis. Soft tissues Soft tissue wound or ulceration at the dorsal aspect of the forefoot overlying the second metatarsal head. Additional shallow wounds or ulcerations dorsal to the third and fourth metatarsal heads. Extensive soft tissue edema. No organized or drainable fluid collections. IMPRESSION: 1. Postsurgical changes of amputation of the second through fourth toes. Soft tissue wounds or ulcerations at the dorsal aspect of the forefoot overlying the second metatarsal head. Additional shallow wounds or ulcerations dorsal to the third and fourth metatarsal heads. 2. Small postsurgical defect or focal erosion involving the distal aspect of the residual fourth toe proximal phalanx. Appearance is suspicious for osteomyelitis. 3. Periosteal edema of the second and third metatarsal heads with relatively mild bone marrow edema. Findings are suspicious for early acute osteomyelitis. 4. Prominent bone marrow edema within the first and third metatarsal  diaphyses and at the proximal metaphysis of the second metatarsal. No well-defined fracture line. Findings are favored to represent stress-related changes. 5. Extensive soft tissue edema. No organized or drainable fluid collections. Electronically Signed   By: Duanne Guess D.O.   On: 08/18/2022 20:24   CT Angio Chest PE W and/or Wo Contrast  Result Date: 08/17/2022 CLINICAL DATA:  Sepsis. EXAM: CT ANGIOGRAPHY CHEST WITH CONTRAST TECHNIQUE: Multidetector CT imaging of the chest was performed using the standard protocol during bolus administration of intravenous contrast. Multiplanar CT image reconstructions and MIPs were obtained to evaluate the vascular anatomy. RADIATION DOSE REDUCTION: This exam was performed according to the departmental dose-optimization program which includes automated exposure control, adjustment of the mA and/or kV according to patient size and/or use of iterative reconstruction technique. CONTRAST:  60mL OMNIPAQUE IOHEXOL 350 MG/ML SOLN COMPARISON:  Chest x-ray 08/17/2022 earlier FINDINGS: Cardiovascular: No pulmonary embolism identified. Status post median sternotomy. Heart is nonenlarged. No significant pericardial effusion. The thoracic aorta has a normal course and caliber with mild atherosclerotic calcified plaque. Coronary artery calcifications are noted. 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 Mediastinum/Nodes: Mildly patulous esophagus. No specific abnormal lymph node enlargement seen in the axillary region, hilum or mediastinum. Lungs/Pleura: Mild diffuse breathing motion. No consolidation, pneumothorax or effusion. Dependent atelectasis. 3 mm nodule right upper lobe on series 5, image 77 is noncalcified. Similar focus on image 56 but this has a component of calcification peripherally. Upper Abdomen: Gallstones in the nondilated gallbladder. There is dystrophic calcification along the left adrenal gland. Please correlate with any previous infectious,  inflammatory or traumatic process. This was seen on the CT scan of 2014 of the abdomen and pelvis. Musculoskeletal: Mild degenerative changes along the spine. Review of the MIP images confirms the above findings. IMPRESSION: No pulmonary embolism identified.  Mild breathing motion. Postop chest. Gallstones. 3 mm lung nodules. No follow-up needed if patient is low-risk (and has no known or suspected primary neoplasm). Non-contrast chest CT can be considered in 12 months if patient is high-risk. This recommendation follows the consensus statement: Guidelines for Management of Incidental Pulmonary Nodules Detected on CT Images: From the Fleischner Society 2017; Radiology 2017; 284:228-243. Aortic Atherosclerosis (ICD10-I70.0). Electronically Signed   By: Karen Kays M.D.   On: 08/17/2022 13:43   CT HEAD WO CONTRAST ( )  Result Date: 08/17/2022 CLINICAL DATA:  Possible wound infection to left foot EXAM: CT HEAD WITHOUT CONTRAST TECHNIQUE: Contiguous axial images were obtained  from the base of the skull through the vertex without intravenous contrast. RADIATION DOSE REDUCTION: This exam was performed according to the departmental dose-optimization program which includes automated exposure control, adjustment of the mA and/or kV according to patient size and/or use of iterative reconstruction technique. COMPARISON:  None Available. FINDINGS: Brain: No evidence of acute infarction, hemorrhage, mass, mass effect, or midline shift. No hydrocephalus or extra-axial fluid collection. Vascular: No hyperdense vessel. Atherosclerotic calcifications in the intracranial carotid and vertebral arteries. Skull: Negative for fracture or focal lesion. Sinuses/Orbits: Mucosal thickening in the ethmoid air cells and left maxillary sinus. No acute finding in the orbits. Status post bilateral lens replacements. Other: The mastoid air cells are well aerated. IMPRESSION: No acute intracranial process. Electronically Signed   By: Wiliam Ke M.D.   On: 08/17/2022 13:35   DG Chest 2 View  Result Date: 08/17/2022 CLINICAL DATA:  Sepsis EXAM: CHEST - 2 VIEW COMPARISON:  CXR 07/19/12 FINDINGS: Status median sternotomy and CABG. No pleural effusion. No pneumothorax. Normal cardiac and mediastinal contours. No focal airspace opacity. No radiographically apparent displaced rib fractures. Vertebral body heights are visualized upper abdomen is unremarkable IMPRESSION: No focal airspace opacity Electronically Signed   By: Lorenza Cambridge M.D.   On: 08/17/2022 10:20    Blood pressure (!) 105/58, pulse 66, temperature 99.9 F (37.7 C), resp. rate 20, height  (1.905 m), weight 113.4 kg, SpO2 94 %.  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  Potentially shows early osteomyelitis to the second, third, fourth rays.  Patient also has scattered marrow edema throughout the forefoot in general.  No obvious abscess present. -Examined patient today.  Patient appears to have worsening erythema and edema present to the left forefoot and now with erythema extending up the plantar forefoot to the midfoot level and arch following the flexor tendon sheath.  Concern for flexor tendon abscess as well as proximal streaking cellulitis with underlying osteomyelitis.  Patient's tissues at the second joint level at the second metatarsal phalange joint appeared to be fibronecrotic with moderate odor. -Discussed with patient concern for necrotizing type of infection present to the foot based on current appearance. -All treatment options were discussed with the patient both conservative and surgical attempts at correction include potential risks and complications.  At this time patient is elected for surgical procedure consisting of left transmetatarsal  amputation with incision and drainage left foot and ankle with possible antibiotic bead application.  Discussed with patient that he is at high risk for limb loss due to current infection and previous history of amputation as well as diabetes with polyneuropathy.  Patient will have to be nonweightbearing for likely 2 to 3 weeks if all heals well could be longer if wound healing issues. -Discussed case with Dr. Ether Griffins as well who is in agreement. -Will assess patient's circulation and surgery.  If the patient appears to have limited blood flow then at that time would recommend consultation with vascular surgery service and ABIs.  Believe that surgery is necessary though to proceed with this time due to necrotizing infection present. -Patient currently placed NPO for surgery later today in the early afternoon. -Appreciate medicine recommendations for antibiotic therapy.  Recommend infectious disease consultation on Monday.  Rosetta Posner, DPM 08/19/2022, 9:47 AM

## 2022-08-20 DIAGNOSIS — L089 Local infection of the skin and subcutaneous tissue, unspecified: Secondary | ICD-10-CM | POA: Diagnosis not present

## 2022-08-20 DIAGNOSIS — A419 Sepsis, unspecified organism: Secondary | ICD-10-CM | POA: Diagnosis not present

## 2022-08-20 DIAGNOSIS — E1122 Type 2 diabetes mellitus with diabetic chronic kidney disease: Secondary | ICD-10-CM | POA: Diagnosis not present

## 2022-08-20 DIAGNOSIS — I4891 Unspecified atrial fibrillation: Secondary | ICD-10-CM | POA: Diagnosis not present

## 2022-08-20 LAB — GLUCOSE, CAPILLARY
Glucose-Capillary: 90 mg/dL (ref 70–99)
Glucose-Capillary: 110 mg/dL — ABNORMAL HIGH (ref 70–99)
Glucose-Capillary: 62 mg/dL — ABNORMAL LOW (ref 70–99)
Glucose-Capillary: 101 mg/dL — ABNORMAL HIGH (ref 70–99)
Glucose-Capillary: 97 mg/dL (ref 70–99)

## 2022-08-20 LAB — CULTURE, BLOOD (ROUTINE X 2): Culture: NO GROWTH

## 2022-08-20 LAB — CBC
HCT: 26.7 % — ABNORMAL LOW (ref 39.0–52.0)
Hemoglobin: 8.8 g/dL — ABNORMAL LOW (ref 13.0–17.0)
MCH: 30.8 pg (ref 26.0–34.0)
MCHC: 33 g/dL (ref 30.0–36.0)
MCV: 93.4 fL (ref 80.0–100.0)
Platelets: 282 10*3/uL (ref 150–400)
RBC: 2.86 MIL/uL — ABNORMAL LOW (ref 4.22–5.81)
RDW: 12.7 % (ref 11.5–15.5)
WBC: 14.2 10*3/uL — ABNORMAL HIGH (ref 4.0–10.5)
nRBC: 0 % (ref 0.0–0.2)

## 2022-08-20 LAB — AEROBIC CULTURE W GRAM STAIN (SUPERFICIAL SPECIMEN)

## 2022-08-20 NOTE — Progress Notes (Signed)
       CROSS COVER NOTE  NAME: DAQWAN DOUGAL MRN: [id number] DOB : 19-May-1950 ATTENDING PHYSICIAN: Enedina Finner, MD  Notified by nursing that patient had and unwitnessed fall. "Patient reported to me 10 min ago that he fell around 21;00 while going to bathroom. He states he did not hit his head nor did he injure anything". Mr Olden is alert and oriented x4. There is no visible sign of injury on assessment. We will continue to monitor and consider additional testing if patient begins to exhibit any symptoms of pain or change from neurological baseline.  This document was prepared using Dragon voice recognition software and may include unintentional dictation errors.  Bishop Limbo DNP, MBA, FNP-BC Nurse Practitioner [facility] Pager [phone]
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
## 2022-08-20 NOTE — Evaluation (Signed)
Physical Therapy Evaluation Patient Details Name: Andrew Harding MRN: [id number] DOB: [birth year] Today's Date: 08/20/2022  History of Present Illness  Patient is a 72 year old male with recent toe amputations on left foot. Found to have worsening infection with gangrene and osteomyelitis. 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 History of diabetes mellitus   Clinical Impression  The patient is agreeable to PT evaluation. He is typically independence and still working as an [employer]. He lives alone but has support from family/friends.  The patient has difficulty with maintaining true NWB of LLE during gait training. Despite cues for technique, he is only able to maintain NWB for brief periods with activity tolerance limited by fatigue and dizziness. He is motivated to regain independence and is hopeful to return home soon. Recommend to continue PT to maximize independence and to decrease caregiver burden. Anticipate patient could tolerate intensive therapy as needed upon this hospital discharge which is recommended to regain functional independence.      Recommendations for follow up therapy are one component of a multi-disciplinary discharge planning process, led by the attending physician.  Recommendations may be updated based on patient status, additional functional criteria and insurance authorization.  Follow Up Recommendations       Assistance Recommended at Discharge Intermittent Supervision/Assistance  Patient can return home with the following  A little help with walking and/or transfers;A little help with bathing/dressing/bathroom;Assistance with cooking/housework;Assist for transportation;Help with stairs or ramp for entrance    Equipment Recommendations  (to be determined)  Recommendations for Other Services       Functional Status Assessment Patient has had a recent decline in their functional status and demonstrates the ability to make significant improvements  in function in a reasonable and predictable amount of time.     Precautions / Restrictions Precautions Precautions: Fall Restrictions Weight Bearing Restrictions: Yes LLE Weight Bearing: Non weight bearing Other Position/Activity Restrictions: post-op shoe      Mobility  Bed Mobility Overal bed mobility: Independent                  Transfers Overall transfer level: Needs assistance Equipment used: Rolling walker (2 wheels) Transfers: Sit to/from Stand Sit to Stand: Min assist, From elevated surface           General transfer comment: verbal cues for LLE positioning with sitting and standing in order to maintain weight bearing status. lifting assistance required to stand    Ambulation/Gait Ambulation/Gait assistance: Mod assist, Min assist Gait Distance (Feet): 15 Feet Assistive device: Rolling walker (2 wheels)   Gait velocity: decreased     General Gait Details: moderate to maximal cues for technique using RW and NWB. patient has difficulty maintaining true non weight bearing to LLE despite cues for technique. he can maintain for brief periods but unable to sustain due to fatigue with rest breaks required. activity tolerance also limited by dizziness  Stairs            Wheelchair Mobility    Modified Rankin (Stroke Patients Only)       Balance Overall balance assessment: Needs assistance Sitting-balance support: Feet supported Sitting balance-Leahy Scale: Fair     Standing balance support: Bilateral upper extremity supported Standing balance-Leahy Scale: Poor Standing balance comment: external support required                             Pertinent Vitals/Pain Pain Assessment Pain Assessment: 0-10 Pain Score:  3  Pain Location: L foot Pain Descriptors / Indicators: Aching Pain Intervention(s): Limited activity within patient's tolerance, Monitored during session, Repositioned    Home Living Family/patient expects to be  discharged to:: Private residence Living Arrangements: Alone Available Help at Discharge: Friend(s) Type of Home: House Home Access: Stairs to enter Entrance Stairs-Rails: None Entrance Stairs-Number of Steps: 4 Alternate Level Stairs-Number of Steps: bonus room (not required daily) with stairs to enter Home Layout: Able to live on main level with bedroom/bathroom Home Equipment: Cane - Programmer, applications (2 wheels) Additional Comments: Male friend at bedside today; when asked what their relationship is, she states "it's complicated". It appears that she cares about him a lot, but is unable to be with him 24/7 due to health concerns of her own and responsibility for caring for others. She lives 20 minutes away; can stop by maybe 1x/daily. Did not explore this session about other friend relationships who may be able to help.    Prior Function Prior Level of Function : Working/employed;Driving             Mobility Comments: typically active, is an [employer] [facility] [employer]. independent prior to recent surgery (afterwards was using quad cane for ambulation and had a fall at home) ADLs Comments: independent prior to surgery     Hand Dominance   Dominant Hand: Right    Extremity/Trunk Assessment   Upper Extremity Assessment Upper Extremity Assessment: Overall WFL for tasks assessed    Lower Extremity Assessment Lower Extremity Assessment: LLE deficits/detail;RLE deficits/detail RLE Sensation: history of peripheral neuropathy LLE Deficits / Details: transmet amputation. patient is able to activate hip/knee/ankle movement LLE Sensation: history of peripheral neuropathy       Communication   Communication: No difficulties;HOH  Cognition Arousal/Alertness: Awake/alert Behavior During Therapy: WFL for tasks assessed/performed Overall Cognitive Status: Within Functional Limits for tasks assessed                                 General Comments: cooperative         General Comments General comments (skin integrity, edema, etc.): Pt fatiguing after approx 7 feet of mobility at RW, not able to consistently maintain NWB status despite cues; pt with post-op shoe on L foot. Pt did t/f back to supine without warning once on EOB, after which he admitted he was starting to feel dizzy; education provided on alerting therapist when pt feeling dizzy 2/2 safety and possible BP change.    Exercises     Assessment/Plan    PT Assessment Patient needs continued PT services  PT Problem List Decreased strength;Decreased range of motion;Decreased activity tolerance;Decreased balance;Decreased mobility;Decreased safety awareness;Decreased knowledge of precautions;Pain;Impaired sensation       PT Treatment Interventions DME instruction;Gait training;Stair training;Functional mobility training;Therapeutic activities;Therapeutic exercise;Balance training;Neuromuscular re-education;Cognitive remediation;Patient/family education    PT Goals (Current goals can be found in the Care Plan section)  Acute Rehab PT Goals Patient Stated Goal: to go home PT Goal Formulation: With patient Time For Goal Achievement: 09/03/22 Potential to Achieve Goals: Good    Frequency Min 4X/week     Co-evaluation PT/OT/SLP Co-Evaluation/Treatment: Yes Reason for Co-Treatment: For patient/therapist safety;To address functional/ADL transfers PT goals addressed during session: Mobility/safety with mobility OT goals addressed during session: ADL's and self-care       AM-PAC PT "6 Clicks" Mobility  Outcome Measure Help needed turning from your back to your side while in a flat bed without  using bedrails?: None Help needed moving from lying on your back to sitting on the side of a flat bed without using bedrails?: A Little Help needed moving to and from a bed to a chair (including a wheelchair)?: A Little Help needed standing up from a chair using your arms (e.g., wheelchair or bedside  chair)?: A Lot Help needed to walk in hospital room?: A Lot Help needed climbing 3-5 steps with a railing? : Total 6 Click Score: 15    End of Session Equipment Utilized During Treatment: Gait belt Activity Tolerance: Patient limited by fatigue;Patient tolerated treatment well Patient left: in bed;with call bell/phone within reach;with family/visitor present;with bed alarm set   PT Visit Diagnosis: Muscle weakness (generalized) (M62.81);Difficulty in walking, not elsewhere classified (R26.2)    Time: 1610-9604 PT Time Calculation (min) (ACUTE ONLY): 44 min   Charges:   PT Evaluation $PT Eval Low Complexity: 1 Low PT Treatments $Gait Training: 8-22 mins       Donna Bernard, PT, MPT   Ina Homes 08/20/2022, 12:15 PM

## 2022-08-20 NOTE — Progress Notes (Addendum)
[facility]  - [place] [facility]   PATIENT NAME: Andrew Harding    MR#:  [id number]  DATE OF BIRTH:  1951/03/29  SUBJECTIVE:   Patient seen earlier.no family at bedside. Questions answered.  VITALS:  Blood pressure 118/62, pulse (!) 101, temperature 98.5 F (36.9 C), resp. rate 16, height 6\' 3"  (1.905 m), weight 113.4 kg, SpO2 98 %.  PHYSICAL EXAMINATION:   GENERAL:  72 y.o.-year-old patient with no acute distress.  LUNGS: Normal breath sounds bilaterally, no wheezing CARDIOVASCULAR: S1, S2 normal. No murmur   ABDOMEN: Soft, nontender, nondistended. Bowel sounds present.  EXTREMITIES: From  08/18/2022  08/20/2022--  NEUROLOGIC: nonfocal  patient is alert and awake SKIN: as above  LABORATORY PANEL:  CBC Recent Labs  Lab 08/20/22 0831  WBC 14.2*  HGB 8.8*  HCT 26.7*  PLT 282     Chemistries  Recent Labs  Lab 08/17/22 0944 08/19/22 0453 08/19/22 1123  NA 135   < > 134*  K 3.4*   < > 3.9  CL 100   < > 104  CO2 24   < > 23  GLUCOSE 146*   < > 157*  BUN 29*   < > 18  CREATININE 2.02*   < > 1.49*  CALCIUM 9.0   < > 8.1*  MG 1.3*  --   --   AST 244*  --  126*  ALT 181*  --  153*  ALKPHOS 132*  --  127*  BILITOT 0.9  --  0.9   < > = values in this interval not displayed.    Cardiac Enzymes No results for input(s): "TROPONINI" in the last 168 hours. RADIOLOGY:  DG Foot Complete Left  Result Date: 08/19/2022 CLINICAL DATA:  Postop. EXAM: LEFT FOOT - COMPLETE 3+ VIEW COMPARISON:  Preoperative exam earlier today FINDINGS: Interval transmetatarsal amputation of all 5 rays. Presumed antibiotic beads projecting over the mid metatarsals as well as the ankle at the level of the medial malleolus. Overlying skin staples in place. IMPRESSION: Interval transmetatarsal amputation of all 5 rays. Presumed antibiotic beads projecting over the mid metatarsals and medial ankle. Electronically Signed   By: Narda Rutherford M.D.   On: 08/19/2022 16:48   DG Foot 2  Views Left  Result Date: 08/19/2022 CLINICAL DATA:  Infection, patient reports recent surgery. EXAM: LEFT FOOT - 2 VIEW COMPARISON:  Foot MRI yesterday FINDINGS: Prior amputation of the second and third toes as well as fourth toe at the proximal aspect of the proximal phalanx. Focal defect involving the residual fourth toe proximal phalanx as seen on prior MRI. No definite radiographic correlate to the edematous changes in the second and third metatarsal heads on MRI. No evidence of acute fracture. Soft tissue edema is seen over the dorsum of the forefoot. Vascular calcifications. IMPRESSION: 1. Prior amputation of the second and third toes as well as fourth toe at the proximal phalanx. Small defect in the fourth toe proximal phalanx corresponding to MRI findings. No radiographic correlate to the marrow edema changes in the second and third metatarsal heads on MRI. 2. Forefoot soft tissue edema. Electronically Signed   By: Narda Rutherford M.D.   On: 08/19/2022 16:45   MR FOOT LEFT WO CONTRAST  Result Date: 08/18/2022 CLINICAL DATA:  Foot swelling, diabetic, osteomyelitis suspected, xray done EXAM: MRI OF THE LEFT FOOT WITHOUT CONTRAST TECHNIQUE: Multiplanar, multisequence MR imaging of the left forefoot was performed. No intravenous contrast was administered. COMPARISON:  None Available.  FINDINGS: Bones/Joint/Cartilage Postsurgical changes of amputation of the second and third toes at the metatarsophalangeal joint level. Prior fourth toe amputation through the base of the proximal phalanx. Surgical defect or focal erosion involving the distal aspect of the residual fourth toe proximal phalanx (series 9, image 14). There is periosteal edema of the second and third metatarsal heads with relatively mild bone marrow edema. Prominent bone marrow edema within the first and third metatarsal diaphyses and at the proximal metaphysis of the second metatarsal. No well-defined fracture line. Normal alignment. Relatively  mild degenerative changes. Ligaments Intact Lisfranc ligament. Remaining collateral ligaments are intact. Muscles and Tendons Chronic denervation changes of the foot musculature. Amputation changes of the musculotendinous structures of the second through fourth toes. No tenosynovitis. Soft tissues Soft tissue wound or ulceration at the dorsal aspect of the forefoot overlying the second metatarsal head. Additional shallow wounds or ulcerations dorsal to the third and fourth metatarsal heads. Extensive soft tissue edema. No organized or drainable fluid collections. IMPRESSION: 1. Postsurgical changes of amputation of the second through fourth toes. Soft tissue wounds or ulcerations at the dorsal aspect of the forefoot overlying the second metatarsal head. Additional shallow wounds or ulcerations dorsal to the third and fourth metatarsal heads. 2. Small postsurgical defect or focal erosion involving the distal aspect of the residual fourth toe proximal phalanx. Appearance is suspicious for osteomyelitis. 3. Periosteal edema of the second and third metatarsal heads with relatively mild bone marrow edema. Findings are suspicious for early acute osteomyelitis. 4. Prominent bone marrow edema within the first and third metatarsal diaphyses and at the proximal metaphysis of the second metatarsal. No well-defined fracture line. Findings are favored to represent stress-related changes. 5. Extensive soft tissue edema. No organized or drainable fluid collections. Electronically Signed   By: Duanne Guess D.O.   On: 08/18/2022 20:24    Assessment and Plan  Andrew Harding is a 72 y.o. 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 Irene Limbo office after he went for wound check and noted upside was swollen and had erythema. Patient also had a syncopal episode couple days ago at home.  He tells me he is not been his usual baseline and thinks he is not eating and drinking well.   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 -- Received IV vancomycin and Rocephin-- will change to IV cefepime +Vacnomycin -- podiatry consultation with Dr. Lodema Pilot Baker--recommends TMT with possible further exploration --WC FEW WBC PRESENT, PREDOMINANTLY PMN  MODERATE GRAM NEGATIVE RODS  FEW GRAM POSITIVE COCCI  --BC negative --ID consultation on Monday --s/p Left foot transmetatarsal amputation on 08/19/22 Left foot and ankle incision and drainage with removal of all nonviable necrotic tissue. Application of antibiotic beads --vascular surgery  consult noted--Angiogram next week to see Left LE circulation  Atrial fibrillation, New, rate controlled H/o CAD,s/p CABG x1 in 2014 --repeat EKG showed Afib -- patient denies chest pain shortness of breath and palpitations -anesthesia requires cardiology clearance. -- Discussed with cardiology Dr. Welton Flakes--- recommends proceeding with surgery   Uncontrolled type II diabetes with peripheral neuropathy and CKD stage IIIB -- baseline creatinine around 1.4 -- creatinine 2.02-- 1.4 -- continue insulin and sliding scale -- Pt  counselled on carb control diet -- patient at home on Lantus, metformin,Trulicity --A1c 6.9%   Hypotension with syncopal episode in the setting of pre-renal azotemia in post-op period -- CT head negative -- CT chest negative for PE -- blood pressure improved with IV fluids   CKD stage IIIB -- as above   Hyperlipidemia -- continue statins      Advance Care Planning:   Code Status: Full Code   Consults: Podiatry  Family Communication:  none today DVT Prophylaxis :lovenox Level of care:  Med-Surg Status is: Inpatient Remains inpatient appropriate because: foot infection    TOTAL TIME TAKING CARE OF THIS PATIENT: 35 minutes.  >50% time spent on counselling and coordination of care  Note: This dictation was prepared with Dragon dictation along with smaller phrase technology. Any transcriptional errors that result from this process are unintentional.  Enedina Finner M.D    [facility]   CC: Primary care physician; Lynnea Ferrier, MD

## 2022-08-20 NOTE — Progress Notes (Signed)
SUBJECTIVE: Andrew Harding is a 72 y.o. 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 Irene Limbo office after he went for wound check and noted upside was swollen and had erythema. Patient also had a syncopal episode couple days ago at home. Reports he has not been his usual baseline and thinks he is not eating and drinking well.    Vitals:   08/19/22 1815 08/19/22 1937 08/20/22 0539 08/20/22 0701  BP: 106/60 126/71 (!) 114/51 118/62  Pulse: 83 65 95 (!) 101  Resp: Temp: 97.7 F (36.5 C) 98.8 F (37.1 C) (!) 101.1 F (38.4 C) 98.5 F (36.9 C)  TempSrc: Oral Oral Oral   SpO2: 100% 96% 99% 98%  Weight:      Height:        Intake/Output Summary (Last 24 hours) at 08/20/2022 1507 Last data filed at 08/20/2022 0159 Gross per 24 hour  Intake 1051.79 ml  Output 0 ml  Net 1051.79 ml    LABS: Basic Metabolic Panel: Recent Labs    08/19/22 0453 08/19/22 1123  NA 133* 134*  K 4.0 3.9  CL 103 104  CO2 21* 23  GLUCOSE 254* 157*  BUN 19 18  CREATININE 1.50* 1.49*  CALCIUM 8.0* 8.1*   Liver Function Tests: Recent Labs    08/19/22 1123  AST 126*  ALT 153*  ALKPHOS 127*  BILITOT 0.9  PROT 6.6  ALBUMIN 2.4*   No results for input(s): "LIPASE", "AMYLASE" in the last 72 hours. CBC: Recent Labs    08/19/22 1123 08/20/22 0831  WBC 13.0* 14.2*  HGB 9.5* 8.8*  HCT 28.6* 26.7*  MCV 92.6 93.4  PLT 276 282   Cardiac Enzymes: No results for input(s): "CKTOTAL", "CKMB", "CKMBINDEX", "TROPONINI" in the last 72 hours. BNP: Invalid input(s): "POCBNP" D-Dimer: No results for input(s): "DDIMER" in the last 72 hours. Hemoglobin A1C: Recent Labs    08/17/22 1823  HGBA1C 6.9*   Fasting Lipid Panel: No results for input(s): "CHOL", "HDL", "LDLCALC", "TRIG", "CHOLHDL", "LDLDIRECT" in the last 72 hours. Thyroid Function  Tests: No results for input(s): "TSH", "T4TOTAL", "T3FREE", "THYROIDAB" in the last 72 hours.  Invalid input(s): "FREET3" Anemia Panel: No results for input(s): "VITAMINB12", "FOLATE", "FERRITIN", "TIBC", "IRON", "RETICCTPCT" in the last 72 hours.   PHYSICAL EXAM General: Well developed, well nourished, in no acute distress HEENT:  Normocephalic and atramatic Neck:  No JVD.  Lungs: Clear bilaterally to auscultation and percussion. Heart: HRRR . Normal S1 and S2 without gallops or murmurs.  Abdomen: Bowel sounds are positive, abdomen soft and non-tender  Msk:  Back normal, normal gait. Normal strength and tone for age. Extremities: Left foot bandaged Neuro: Alert and oriented X 3. Psych:  Good affect, responds appropriately  TELEMETRY: atrial fibrillation  ASSESSMENT AND PLAN: Patient resting comfortably in bed. Denies chest pain, shortness of breath. Patient complains of feeling fatigued. Patient reports not eating or drinking enough. Discussed different options to help increase his appetite. Patient underwent left TMA on 08/19/22. Vascular surgery managing. Will continue to follow.    ICD-10-CM   1. Sepsis, due to unspecified organism, unspecified whether acute organ dysfunction present  A41.9     2. Cellulitis of other specified site  L03.818     3. AKI (acute kidney injury)  N17.9     4. Atrial fibrillation, unspecified type  I48.91  Principal Problem:   Sepsis Active Problems:   Type 2 diabetes mellitus with stage 3b chronic kidney disease, with long-term current use of insulin   Cellulitis   Hypotension due to hypovolemia   Infection of left foot   Atrial fibrillation    Johntavious Francom, FNP-C 08/20/2022 3:07 PM

## 2022-08-20 NOTE — Progress Notes (Signed)
End of Shift Summary:  Date: 08/20/22 Shift: 1900-0700 Ambulatory: NWB to left foot. Significant Events: Patient started night with chills and shivering/tremor (see previous note). Dr. Allena Katz notified. Patient with temp 101.1 around 0530. Tylenol given.

## 2022-08-20 NOTE — Progress Notes (Signed)
Patient reported to this RN that he had called out around 21:00 to go to the bathroom. He stated that no one came so he took himself to the bathroom. Patient reported while in the bathroom he fell between the toilet and the bathroom door. He states that he slowly got up and went back to bed. Patient denies hitting his head, he also denies and injury or pain. Patient verbalized that he would let his family know tomorrow (08-21-22) that he fell. Bishop Limbo NP notified via secure chat. Anselm Jungling

## 2022-08-20 NOTE — Evaluation (Signed)
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   Clinical Impression   Patient received for OT evaluation. See flowsheet below for details of function. Generally, patient (I) for bed mobility, MIN A with RW for functional mobility (and unable to fully maintain NWB status on LLE during 15 ft mobility), and set up-MOD A for ADLs. Patient will benefit from continued OT while in acute care. Pt requires follow-up therapy upon d/c from hospital.       Recommendations for follow up therapy are one component of a multi-disciplinary discharge planning process, led by the attending physician.  Recommendations may be updated based on patient status, additional functional criteria and insurance authorization.   Assistance Recommended at Discharge Intermittent Supervision/Assistance  Patient can return home with the following A little help with walking and/or transfers;A little help with bathing/dressing/bathroom;Assistance with cooking/housework;Assist for transportation;Help with stairs or ramp for entrance    Functional Status Assessment  Patient has had a recent decline in their functional status and demonstrates the ability to make significant improvements in function in a reasonable and predictable amount of time.  Equipment Recommendations  Other (comment) (defer to next venue of care)    Recommendations for Other Services       Precautions / Restrictions Precautions Precautions: Fall Restrictions Weight Bearing Restrictions: Yes LLE Weight Bearing: Non weight bearing Other Position/Activity Restrictions: post-op shoe      Mobility Bed Mobility Overal bed mobility: Independent                  Transfers Overall  transfer level: Needs assistance Equipment used: Rolling walker (2 wheels) Transfers: Sit to/from Stand Sit to Stand: Min assist, From elevated surface           General transfer comment: Sit to stand with RW and gait belt.      Balance Overall balance assessment: Mild deficits observed, not formally tested (Pt with difficulty maintaining NWB status)                                         ADL either performed or assessed with clinical judgement   ADL Overall ADL's : Needs assistance/impaired                                       General ADL Comments: Pt able to demonstrate donning/doffing R sock today; good LE flexibility. UE WNL. Did not attempt toilet transfer today, although friend at bedside stated pt used toilet with nursing earlier today. Pt has limited activity tolerance, limited walking tolerance, and decreased ability to maintain NWB status. Encouraged friend to bring a R tennis shoe in for pt to use during mobility. Anticipate pt to be set up for ADLs from seated position only; any standing ADLs will be very limited and pt will likely need assistance for maintaining balance and WB status.     Vision Baseline Vision/History: 1 Wears glasses (glasses for reading only) Patient Visual Report: No change from baseline Vision Assessment?: No apparent visual deficits     Perception     Praxis  Pertinent Vitals/Pain Pain Assessment Pain Assessment: 0-10 Pain Score: 3  Pain Location: L foot Pain Descriptors / Indicators: Aching Pain Intervention(s): Limited activity within patient's tolerance, Monitored during session     Hand Dominance Right   Extremity/Trunk Assessment Upper Extremity Assessment Upper Extremity Assessment: Overall WFL for tasks assessed   Lower Extremity Assessment Lower Extremity Assessment: LLE deficits/detail LLE Deficits / Details: L transmet amputation       Communication  Communication Communication: No difficulties;HOH   Cognition Arousal/Alertness: Awake/alert Behavior During Therapy: WFL for tasks assessed/performed Overall Cognitive Status: Within Functional Limits for tasks assessed                                 General Comments: Pleasant and extremely motivated to return to (I) level of function.     General Comments  Pt fatiguing after approx 7 feet of mobility at RW, not able to consistently maintain NWB status despite cues; pt with post-op shoe on L foot. Pt did t/f back to supine without warning once on EOB, after which he admitted he was starting to feel dizzy; education provided on alerting therapist when pt feeling dizzy 2/2 safety and possible BP change.    Exercises     Shoulder Instructions      Home Living Family/patient expects to be discharged to:: Private residence Living Arrangements: Alone (friend can stop by intermittently, but not frequently.) Available Help at Discharge: Friend(s) Type of Home: House Home Access: Stairs to enter [employer] of Steps: 4 Entrance Stairs-Rails: None Home Layout: Two level;Able to live on main level with bedroom/bathroom Alternate Level Stairs-Number of Steps: flight to a bonus room   Bathroom Shower/Tub: Tub/shower unit   Bathroom Toilet: Handicapped height Bathroom Accessibility: Yes How Accessible: Accessible via walker Home Equipment: Cane - Programmer, applications (2 wheels)   Additional Comments: Male friend at bedside today; when asked what their relationship is, she states "it's complicated". It appears that she cares about him a lot, but is unable to be with him 24/7 due to health concerns of her own and responsibility for caring for others. She lives 20 minutes away; can stop by maybe 1x/daily. Did not explore this session about other friend relationships who may be able to help.      Prior Functioning/Environment Prior Level of Function :  Working/employed;Driving             Mobility Comments: Was WBAT after first surgery; using quad cane. Had a fall at home; unsure how he fell (sounds like he had syncope). Prior to surgery, was walking without AD. ADLs Comments: (I) prior to first surgery 4/12. Friend assisting since then.        OT Problem List: Decreased activity tolerance;Impaired balance (sitting and/or standing);Decreased knowledge of use of DME or AE;Decreased strength      OT Treatment/Interventions: Self-care/ADL training;Therapeutic exercise;Energy conservation;DME and/or AE instruction;Therapeutic activities;Patient/family education    OT Goals(Current goals can be found in the care plan section) Acute Rehab OT Goals Patient Stated Goal: Get better; get back to work OT Goal Formulation: With patient Time For Goal Achievement: 09/03/22 Potential to Achieve Goals: Good ADL Goals Pt Will Perform Lower Body Bathing: with modified independence;sitting/lateral leans Pt Will Perform Lower Body Dressing: with modified independence;sit to/from stand Pt Will Transfer to Toilet: with modified independence;bedside commode Pt Will Perform Toileting - Clothing Manipulation and hygiene: with modified independence;sit to/from stand Pt Will Perform Tub/Shower Transfer:  with modified independence;shower seat  OT Frequency: Min 3X/week    Co-evaluation PT/OT/SLP Co-Evaluation/Treatment: Yes Reason for Co-Treatment: For patient/therapist safety   OT goals addressed during session: ADL's and self-care;Proper use of Adaptive equipment and DME      AM-PAC OT "6 Clicks" Daily Activity     Outcome Measure Help from another person eating meals?: None Help from another person taking care of personal grooming?: A Little Help from another person toileting, which includes using toliet, bedpan, or urinal?: A Little Help from another person bathing (including washing, rinsing, drying)?: A Lot Help from another person to put on  and taking off regular upper body clothing?: None Help from another person to put on and taking off regular lower body clothing?: A Little 6 Click Score: 19   End of Session Equipment Utilized During Treatment: Gait belt;Rolling walker (2 wheels) Nurse Communication: Mobility status  Activity Tolerance: Patient tolerated treatment well Patient left: in bed;with call bell/phone within reach;with family/visitor present  OT Visit Diagnosis: Other abnormalities of gait and mobility (R26.89)                Time: 1610-9604 OT Time Calculation (min): 45 min Charges:  OT General Charges $OT Visit: 1 Visit OT Evaluation $OT Eval Moderate Complexity: 1 Mod OT Treatments $Therapeutic Activity: 8-22 mins  Linward Foster, MS, OTR/L  Alvester Morin 08/20/2022, 12:04 PM

## 2022-08-20 NOTE — Progress Notes (Addendum)
PODIATRY / FOOT AND ANKLE SURGERY PROGRESS NOTE  Requesting Physician: Dr. Allena Katz  Reason for consult: Left foot infection  Chief Complaint: infection   HPI: Andrew Harding is a 72 y.o. male who presents today resting in bed fairly comfortably.  Physical therapy is currently in the room and about to work with patient.  Patient notes that he has no pain to his foot at this time.  He has been compliant and trying to not put any pressure to his left foot.  His wife Andrew Harding is at bedside as well.  Patient notes that he is feeling better today.  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   Tubular adenoma    Vitamin D deficiency     Surgical Hx:  Past Surgical History:  Procedure Laterality Date   AMPUTATION TOE Right 06/01/2015   Procedure: AMPUTATION TOE;  Surgeon: Recardo Evangelist, DPM;  Location: [facility] ORS;  Service: Podiatry;  Laterality: Right;   AMPUTATION TOE Left 08/11/2022   Procedure: AMPUTATION TOE 2, 3, 4;  Surgeon: [name], DPM;  Location: [facility] OR[facility];  Service: Podiatry;  Laterality: Left;   CATARACT EXTRACTION, BILATERAL     CIRCUMCISION N/A 06/12/2022   Procedure: CIRCUMCISION ADULT;  Surgeon: Vanna Scotland, MD;  Location: [facility] ORS;  Service: Urology;  Laterality: N/A;   COLONOSCOPY WITH PROPOFOL N/A 02/14/2016   Procedure: COLONOSCOPY WITH PROPOFOL;  Surgeon: Christena Deem, MD;  Location: [facility] ENDOSCOPY;  Service: Endoscopy;  Laterality: N/A;   COLONOSCOPY WITH PROPOFOL N/A 01/07/2019   Procedure: COLONOSCOPY WITH PROPOFOL;  Surgeon: Christena Deem, MD;  Location: [facility]COPY;  Service: Endoscopy;   Laterality: N/A;   CORONARY ARTERY BYPASS GRAFT N/A 08/2012   EXCISION PARTIAL PHALANX Right 06/01/2015   Procedure: EXCISION PARTIAL PHALANX /  BONE;  Surgeon: Recardo Evangelist, DPM;  Location: [facility] OR[facility];  Service: Podiatry;  Laterality: Right;   FLEXIBLE SIGMOIDOSCOPY N/A 05/29/2016   Procedure: FLEXIBLE SIGMOIDOSCOPY;  Surgeon: Christena Deem, MD;  Location: [facility] ENDOSCOPY;  Service: Endoscopy;  Laterality: N/A;   KNEE ARTHROSCOPY Left     FHx:  Family History  Problem Relation Age of Onset   Diabetes Mellitus II Mother    CAD Mother     Social History:  reports that he has been smoking cigars. He has never used smokeless tobacco. He reports that he does not drink alcohol and does not use drugs.  Allergies: No Known Allergies  Medications Prior to Admission  Medication Sig Dispense Refill   aspirin EC 81 MG tablet Take 81 mg by mouth daily.     betamethasone dipropionate 0.05 % cream Apply topically 2 (two) times daily. 30 g 0   [EXPIRED] cefdinir (OMNICEF) 300 MG capsule Take 300 mg by mouth 2 (two) times daily.     cholecalciferol (VITAMIN D) 1000 units tablet Take 2,000 Units by mouth daily.     gabapentin (NEURONTIN) 300 MG capsule Take 300 mg by mouth 3 (three) times daily.     Insulin Glargine (BASAGLAR KWIKPEN) 100  UNIT/ML Inject into the skin daily. 15 -units in am, 18 units lunch, 20 supper     insulin glargine (LANTUS) 100 UNIT/ML injection Inject 52 Units into the skin at bedtime.     lisinopril (ZESTRIL) 20 MG tablet Take 20 mg by mouth daily.     metFORMIN (GLUCOPHAGE-XR) 500 MG 24 hr tablet 500 mg 2 (two) times daily with a meal.     NOVOLOG FLEXPEN 100 UNIT/ML FlexPen Inject 15-20 Units into the skin 3 (three) times daily with meals.     pravastatin (PRAVACHOL) 80 MG tablet TAKE ONE TABLET BY MOUTH AT NIGHT     silver sulfADIAZINE (SILVADENE) 1 % cream Apply topically.     traZODone (DESYREL) 50 MG tablet TAKE ONE TABLET AT BEDTIME AS NEEDED     TRULICITY 4.5  MG/0.5ML SOPN Inject into the skin.     glucose blood (ONETOUCH ULTRA) test strip Use 3 (three) times daily     Insulin Pen Needle (FIFTY50 PEN NEEDLES) 32G X 4 MM MISC USE 4 TIMES DAILY      Physical Exam: General: Alert and oriented.  No apparent distress.  Vascular: DP/PT pulses palpable bilateral, capillary fill time intact to digits and flap of left foot.  Increased redness and swelling present to the left forefoot at the level of the amputation sites.  Neuro: Light touch sensation reduced to bilateral lower extremities.  Derm: Incision sites to the left foot appear to be fairly well coapted.  Portions of the plantar incision and distal transmetatarsal amputation site incisions were left open for draining purposes.  Overall the tissues appear to be much more healthy today and viable.  Decreased erythema and edema present to the left forefoot extending to the midfoot.  Appears to be greatly improved today.  No palpable fluctuance or purulence expressible from the incision sites today.       MSK: Right hallux amputation.  Left transmetatarsal amputation.  Results for orders placed or performed during the hospital encounter of 08/17/22 (from the past 48 hour(s))  Aerobic Culture w Gram Stain (superficial specimen)     Status: None (Preliminary result)   Collection Time: 08/18/22 10:39 AM   Specimen: Wound  Result Value Ref Range   Specimen Description      WOUND Performed at [facility], [address], [place], [place] [address]    Special Requests      LEFT TOE Performed at [facility], [address] [place], [place] [address]    Gram Stain      FEW WBC PRESENT, PREDOMINANTLY PMN MODERATE GRAM NEGATIVE RODS FEW GRAM POSITIVE COCCI    Culture      MODERATE GRAM NEGATIVE RODS CULTURE REINCUBATED FOR BETTER GROWTH IDENTIFICATION AND SUSCEPTIBILITIES TO FOLLOW Performed at [facility], [address], [place], [place] [address]    Report  Status PENDING   Glucose, capillary     Status: Abnormal   Collection Time: 08/18/22 11:15 AM  Result Value Ref Range   Glucose-Capillary 234 (H) 70 - 99 mg/dL    Comment: Glucose reference range applies only to samples taken after fasting for at least 8 hours.  Glucose, capillary     Status: Abnormal   Collection Time: 08/18/22  4:18 PM  Result Value Ref Range   Glucose-Capillary 214 (H) 70 - 99 mg/dL    Comment: Glucose reference range applies only to samples taken after fasting for at least 8 hours.  Glucose, capillary  Status: Abnormal   Collection Time: 08/18/22  9:57 PM  Result Value Ref Range   Glucose-Capillary 255 (H) 70 - 99 mg/dL    Comment: Glucose reference range applies only to samples taken after fasting for at least 8 hours.  Basic metabolic panel     Status: Abnormal   Collection Time: 08/19/22  4:53 AM  Result Value Ref Range   Sodium 133 (L) 135 - 145 mmol/L   Potassium 4.0 3.5 - 5.1 mmol/L   Chloride 103 98 - 111 mmol/L   CO2 21 (L) 22 - 32 mmol/L   Glucose, Bld 254 (H) 70 - 99 mg/dL    Comment: Glucose reference range applies only to samples taken after fasting for at least 8 hours.   BUN 19 8 - 23 mg/dL   Creatinine, Ser 1.61 (H) 0.61 - 1.24 mg/dL   Calcium 8.0 (L) 8.9 - 10.3 mg/dL   GFR, Estimated 49 (L) >60 mL/min    Comment: (NOTE) Calculated using the CKD-EPI Creatinine Equation (2021)    Anion gap 9 5 - 15    Comment: Performed at [facility], [address] [place], [place] [address]  CBC     Status: Abnormal   Collection Time: 08/19/22  4:53 AM  Result Value Ref Range   WBC 12.2 (H) 4.0 - 10.5 K/uL   RBC 2.98 (L) 4.22 - 5.81 MIL/uL   Hemoglobin 9.1 (L) 13.0 - 17.0 g/dL   HCT 54.0 (L) 98.1 - 19.1 %   MCV 92.3 80.0 - 100.0 fL   MCH 30.5 26.0 - 34.0 pg   MCHC 33.1 30.0 - 36.0 g/dL   RDW 47.8 29.5 - 62.1 %   Platelets 269 150 - 400 K/uL   nRBC 0.0 0.0 - 0.2 %    Comment: Performed at [facility], [address]  [place], [place] [address]  Sedimentation rate     Status: Abnormal   Collection Time: 08/19/22  4:53 AM  Result Value Ref Range   Sed Rate 128 (H) 0 - 20 mm/hr    Comment: Performed at [facility], [address] [place], [place] [address]  C-reactive protein     Status: Abnormal   Collection Time: 08/19/22  6:30 AM  Result Value Ref Range   CRP 17.8 (H) <1.0 mg/dL    Comment: Performed at [facility], [address] [place], [place] [address]  Glucose, capillary     Status: Abnormal   Collection Time: 08/19/22  7:51 AM  Result Value Ref Range   Glucose-Capillary 198 (H) 70 - 99 mg/dL    Comment: Glucose reference range applies only to samples taken after fasting for at least 8 hours.  Comprehensive metabolic panel     Status: Abnormal   Collection Time: 08/19/22 11:23 AM  Result Value Ref Range   Sodium 134 (L) 135 - 145 mmol/L   Potassium 3.9 3.5 - 5.1 mmol/L   Chloride 104 98 - 111 mmol/L   CO2 23 22 - 32 mmol/L   Glucose, Bld 157 (H) 70 - 99 mg/dL    Comment: Glucose reference range applies only to samples taken after fasting for at least 8 hours.   BUN 18 8 - 23 mg/dL   Creatinine, Ser 8.41 (H) 0.61 - 1.24 mg/dL   Calcium 8.1 (L) 8.9 - 10.3 mg/dL   Total Protein 6.6 6.5 - 8.1 g/dL   Albumin 2.4 (L) 3.5 - 5.0 g/dL   AST 324 (  H) 15 - 41 U/L   ALT 153 (H) 0 - 44 U/L   Alkaline Phosphatase 127 (H) 38 - 126 U/L   Total Bilirubin 0.9 0.3 - 1.2 mg/dL   GFR, Estimated 49 (L) >60 mL/min    Comment: (NOTE) Calculated using the CKD-EPI Creatinine Equation (2021)    Anion gap 7 5 - 15    Comment: Performed at [facility], [address] [place], [place] [address]  CBC     Status: Abnormal   Collection Time: 08/19/22 11:23 AM  Result Value Ref Range   WBC 13.0 (H) 4.0 - 10.5 K/uL   RBC 3.09 (L) 4.22 - 5.81 MIL/uL   Hemoglobin 9.5 (L) 13.0 - 17.0 g/dL   HCT 56.2 (L) 13.0 - 86.5 %   MCV 92.6 80.0 - 100.0 fL   MCH 30.7 26.0 - 34.0 pg   MCHC 33.2 30.0 -  36.0 g/dL   RDW 78.4 69.6 - 29.5 %   Platelets 276 150 - 400 K/uL   nRBC 0.0 0.0 - 0.2 %    Comment: Performed at [facility], [address] [place], [place] [address]  Glucose, capillary     Status: Abnormal   Collection Time: 08/19/22 11:49 AM  Result Value Ref Range   Glucose-Capillary 137 (H) 70 - 99 mg/dL    Comment: Glucose reference range applies only to samples taken after fasting for at least 8 hours.  Glucose, capillary     Status: Abnormal   Collection Time: 08/19/22  3:33 PM  Result Value Ref Range   Glucose-Capillary 152 (H) 70 - 99 mg/dL    Comment: Glucose reference range applies only to samples taken after fasting for at least 8 hours.  Glucose, capillary     Status: Abnormal   Collection Time: 08/19/22  7:49 PM  Result Value Ref Range   Glucose-Capillary 180 (H) 70 - 99 mg/dL    Comment: Glucose reference range applies only to samples taken after fasting for at least 8 hours.  Glucose, capillary     Status: None   Collection Time: 08/20/22  7:46 AM  Result Value Ref Range   Glucose-Capillary 90 70 - 99 mg/dL    Comment: Glucose reference range applies only to samples taken after fasting for at least 8 hours.   Comment 1 Notify RN    Comment 2 Document in Chart   CBC     Status: Abnormal   Collection Time: 08/20/22  8:31 AM  Result Value Ref Range   WBC 14.2 (H) 4.0 - 10.5 K/uL   RBC 2.86 (L) 4.22 - 5.81 MIL/uL   Hemoglobin 8.8 (L) 13.0 - 17.0 g/dL   HCT 24.4 (L) 01.0 - 27.2 %   MCV 93.4 80.0 - 100.0 fL   MCH 30.8 26.0 - 34.0 pg   MCHC 33.0 30.0 - 36.0 g/dL   RDW 53.6 64.4 - 03.4 %   Platelets 282 150 - 400 K/uL   nRBC 0.0 0.0 - 0.2 %    Comment: Performed at [facility], [address] [place], [place] [address]   DG Foot Complete Left  Result Date: 08/19/2022 CLINICAL DATA:  Postop. EXAM: LEFT FOOT - COMPLETE 3+ VIEW COMPARISON:  Preoperative exam earlier today FINDINGS: Interval transmetatarsal amputation of all 5 rays. Presumed  antibiotic beads projecting over the mid metatarsals as well as the ankle at the level of the medial malleolus. Overlying skin staples in place. IMPRESSION: Interval transmetatarsal amputation of all  5 rays. Presumed antibiotic beads projecting over the mid metatarsals and medial ankle. Electronically Signed   By: Narda Rutherford M.D.   On: 08/19/2022 16:48   DG Foot 2 Views Left  Result Date: 08/19/2022 CLINICAL DATA:  Infection, patient reports recent surgery. EXAM: LEFT FOOT - 2 VIEW COMPARISON:  Foot MRI yesterday FINDINGS: Prior amputation of the second and third toes as well as fourth toe at the proximal aspect of the proximal phalanx. Focal defect involving the residual fourth toe proximal phalanx as seen on prior MRI. No definite radiographic correlate to the edematous changes in the second and third metatarsal heads on MRI. No evidence of acute fracture. Soft tissue edema is seen over the dorsum of the forefoot. Vascular calcifications. IMPRESSION: 1. Prior amputation of the second and third toes as well as fourth toe at the proximal phalanx. Small defect in the fourth toe proximal phalanx corresponding to MRI findings. No radiographic correlate to the marrow edema changes in the second and third metatarsal heads on MRI. 2. Forefoot soft tissue edema. Electronically Signed   By: Narda Rutherford M.D.   On: 08/19/2022 16:45   MR FOOT LEFT WO CONTRAST  Result Date: 08/18/2022 CLINICAL DATA:  Foot swelling, diabetic, osteomyelitis suspected, xray done EXAM: MRI OF THE LEFT FOOT WITHOUT CONTRAST TECHNIQUE: Multiplanar, multisequence MR imaging of the left forefoot was performed. No intravenous contrast was administered. COMPARISON:  None Available. FINDINGS: Bones/Joint/Cartilage Postsurgical changes of amputation of the second and third toes at the metatarsophalangeal joint level. Prior fourth toe amputation through the base of the proximal phalanx. Surgical defect or focal erosion involving the  distal aspect of the residual fourth toe proximal phalanx (series 9, image 14). There is periosteal edema of the second and third metatarsal heads with relatively mild bone marrow edema. Prominent bone marrow edema within the first and third metatarsal diaphyses and at the proximal metaphysis of the second metatarsal. No well-defined fracture line. Normal alignment. Relatively mild degenerative changes. Ligaments Intact Lisfranc ligament. Remaining collateral ligaments are intact. Muscles and Tendons Chronic denervation changes of the foot musculature. Amputation changes of the musculotendinous structures of the second through fourth toes. No tenosynovitis. Soft tissues Soft tissue wound or ulceration at the dorsal aspect of the forefoot overlying the second metatarsal head. Additional shallow wounds or ulcerations dorsal to the third and fourth metatarsal heads. Extensive soft tissue edema. No organized or drainable fluid collections. IMPRESSION: 1. Postsurgical changes of amputation of the second through fourth toes. Soft tissue wounds or ulcerations at the dorsal aspect of the forefoot overlying the second metatarsal head. Additional shallow wounds or ulcerations dorsal to the third and fourth metatarsal heads. 2. Small postsurgical defect or focal erosion involving the distal aspect of the residual fourth toe proximal phalanx. Appearance is suspicious for osteomyelitis. 3. Periosteal edema of the second and third metatarsal heads with relatively mild bone marrow edema. Findings are suspicious for early acute osteomyelitis. 4. Prominent bone marrow edema within the first and third metatarsal diaphyses and at the proximal metaphysis of the second metatarsal. No well-defined fracture line. Findings are favored to represent stress-related changes. 5. Extensive soft tissue edema. No organized or drainable fluid collections. Electronically Signed   By: Duanne Guess D.O.   On: 08/18/2022 20:24    Blood pressure  118/62, pulse (!) 101, temperature 98.5 F (36.9 C), resp. rate 16, height 6\' 3"  (1.905 m), weight 113.4 kg, SpO2 98 %.  Assessment Sepsis secondary to necrotizing infection left forefoot with possible  abscess and osteomyelitis status post left transmetatarsal amputation with incision and drainage left foot and ankle with antibiotic bead application. Diabetes type 2 polyneuropathy  Plan -Patient seen and examined -Postoperative x-ray imaging reviewed.  No further gas present in tissues that is obvious. -Incision sites appear to be well coapted today and overall appears to have great reduction in erythema and edema, no palpable fluctuance or purulence expressible from wounds today.  Overall appears to be in much better shape. -Removed packing gauze today.  Reapplied packing gauze to the plantar midfoot incision as well as transmetatarsal amputation site.  Applied 4 x 4 gauze, ABD, Kerlix, Ace wrap. -Continue nonweightbearing at all times to the left lower extremity. -Appreciate medicine recommendations for IV antibiotic therapy.  Continue IV antibiotics.  Would likely recommend IV antibiotic continuation for a couple of weeks even after discharge.  Infectious disease consultation will be placed Monday. -Appreciate vascular recommendations.  Patient did appear to have a fair amount of bleeding and tissues appear to be fairly healthy overall but still would recommend further workup as patient does have severe arterial calcification. -Appreciate PT/OT recommendations.  Could consider SNF -Will see patient again tomorrow for another dressing change and removal of packing.  At that point could consider potentially closing up portions of the incision that are open.  Could likely do this bedside as patient is completely neuropathic. -As long as things keep improving could consider discharge early this coming week.  Rosetta Posner, DPM 08/20/2022, 10:05 AM

## 2022-08-21 ENCOUNTER — Other Ambulatory Visit: Payer: Managed Care, Other (non HMO)

## 2022-08-21 ENCOUNTER — Encounter: Payer: Self-pay | Admitting: Podiatry

## 2022-08-21 ENCOUNTER — Inpatient Hospital Stay
Admit: 2022-08-21 | Discharge: 2022-08-21 | Disposition: A | Discharge status: Home / Self Care | Payer: Managed Care, Other (non HMO) | Attending: Cardiology | Admitting: Cardiology

## 2022-08-21 DIAGNOSIS — E1169 Type 2 diabetes mellitus with other specified complication: Secondary | ICD-10-CM

## 2022-08-21 DIAGNOSIS — E11628 Type 2 diabetes mellitus with other skin complications: Secondary | ICD-10-CM

## 2022-08-21 DIAGNOSIS — L089 Local infection of the skin and subcutaneous tissue, unspecified: Secondary | ICD-10-CM | POA: Diagnosis not present

## 2022-08-21 DIAGNOSIS — M86272 Subacute osteomyelitis, left ankle and foot: Secondary | ICD-10-CM | POA: Diagnosis not present

## 2022-08-21 DIAGNOSIS — E1122 Type 2 diabetes mellitus with diabetic chronic kidney disease: Secondary | ICD-10-CM | POA: Diagnosis not present

## 2022-08-21 DIAGNOSIS — I4891 Unspecified atrial fibrillation: Secondary | ICD-10-CM | POA: Diagnosis not present

## 2022-08-21 DIAGNOSIS — A419 Sepsis, unspecified organism: Secondary | ICD-10-CM | POA: Diagnosis not present

## 2022-08-21 LAB — HEPARIN LEVEL (UNFRACTIONATED): Heparin Unfractionated: 0.23 IU/mL — ABNORMAL LOW (ref 0.30–0.70)

## 2022-08-21 LAB — CULTURE, BLOOD (ROUTINE X 2): Special Requests: ADEQUATE

## 2022-08-21 LAB — MAGNESIUM: Magnesium: 1.7 mg/dL (ref 1.7–2.4)

## 2022-08-21 LAB — CBC
HCT: 27.8 % — ABNORMAL LOW (ref 39.0–52.0)
Hemoglobin: 9.2 g/dL — ABNORMAL LOW (ref 13.0–17.0)
MCH: 30.7 pg (ref 26.0–34.0)
MCHC: 33.1 g/dL (ref 30.0–36.0)
MCV: 92.7 fL (ref 80.0–100.0)
Platelets: 331 10*3/uL (ref 150–400)
RBC: 3 MIL/uL — ABNORMAL LOW (ref 4.22–5.81)
RDW: 12.8 % (ref 11.5–15.5)
WBC: 15.9 10*3/uL — ABNORMAL HIGH (ref 4.0–10.5)
nRBC: 0 % (ref 0.0–0.2)

## 2022-08-21 LAB — AEROBIC CULTURE W GRAM STAIN (SUPERFICIAL SPECIMEN)

## 2022-08-21 LAB — CREATININE, SERUM
Creatinine, Ser: 1.36 mg/dL — ABNORMAL HIGH (ref 0.61–1.24)
GFR, Estimated: 55 mL/min — ABNORMAL LOW (ref >60)

## 2022-08-21 LAB — GLUCOSE, CAPILLARY
Glucose-Capillary: 63 mg/dL — ABNORMAL LOW (ref 70–99)
Glucose-Capillary: 120 mg/dL — ABNORMAL HIGH (ref 70–99)
Glucose-Capillary: 137 mg/dL — ABNORMAL HIGH (ref 70–99)
Glucose-Capillary: 126 mg/dL — ABNORMAL HIGH (ref 70–99)

## 2022-08-21 LAB — ECHOCARDIOGRAM COMPLETE: Height: 75 in

## 2022-08-21 MED ORDER — MELATONIN 5 MG PO TABS
10.0000 mg | ORAL_TABLET | Freq: Once | ORAL | Status: AC
  Administered 2022-08-21: 10 mg via ORAL
  Filled 2022-08-21: qty 2

## 2022-08-21 MED ORDER — METOPROLOL TARTRATE 25 MG PO TABS
12.5000 mg | ORAL_TABLET | Freq: Two times a day (BID) | ORAL | Status: DC
  Administered 2022-08-21 – 2022-08-22 (×4): 12.5 mg via ORAL
  Filled 2022-08-21 (×4): qty 1

## 2022-08-21 MED ORDER — ORAL CARE MOUTH RINSE: 15.0000 mL | OROMUCOSAL | Status: DC | PRN

## 2022-08-21 MED ORDER — SODIUM CHLORIDE 0.9 % IV SOLN
2.0000 g | Freq: Three times a day (TID) | INTRAVENOUS | Status: AC
  Administered 2022-08-21 – 2022-08-22 (×5): 2 g via INTRAVENOUS
  Filled 2022-08-21 (×2): qty 2
  Filled 2022-08-21 (×3): qty 12.5

## 2022-08-21 MED ORDER — HEPARIN (PORCINE) 25000 UT/250ML-% IV SOLN
1900.0000 [IU]/h | INTRAVENOUS | Status: DC
  Administered 2022-08-21 (×2): 1700 [IU]/h via INTRAVENOUS
  Filled 2022-08-21 (×3): qty 250

## 2022-08-21 MED ORDER — SODIUM CHLORIDE 0.9 % IV SOLN: INTRAVENOUS | Status: DC | PRN

## 2022-08-21 MED ORDER — CHLORHEXIDINE GLUCONATE CLOTH 2 % EX PADS: 6.0000 | MEDICATED_PAD | Freq: Once | CUTANEOUS | Status: AC

## 2022-08-21 MED ORDER — HEPARIN BOLUS VIA INFUSION
1600.0000 [IU] | Freq: Once | INTRAVENOUS | Status: AC
  Administered 2022-08-21: 1600 [IU] via INTRAVENOUS
  Filled 2022-08-21: qty 1600

## 2022-08-21 MED ORDER — VANCOMYCIN HCL 1500 MG/300ML IV SOLN
1500.0000 mg | INTRAVENOUS | Status: DC
  Administered 2022-08-21 – 2022-08-22 (×2): 1500 mg via INTRAVENOUS
  Filled 2022-08-21 (×2): qty 300

## 2022-08-21 MED ORDER — SODIUM CHLORIDE 0.9 % IV SOLN: INTRAVENOUS | Status: DC

## 2022-08-21 MED ORDER — HEPARIN BOLUS VIA INFUSION
4000.0000 [IU] | Freq: Once | INTRAVENOUS | Status: AC
  Administered 2022-08-21: 4000 [IU] via INTRAVENOUS
  Filled 2022-08-21: qty 4000

## 2022-08-21 MED ORDER — CHLORHEXIDINE GLUCONATE CLOTH 2 % EX PADS
6.0000 | MEDICATED_PAD | Freq: Once | CUTANEOUS | Status: AC
  Administered 2022-08-21: 6 via TOPICAL

## 2022-08-21 NOTE — Progress Notes (Signed)
Pharmacy Antibiotic Note  Andrew Harding is a 72 y.o. male w/ PMH of DM, peripheral neuropathy, HTN, HLD, CAD-CABG, CKD admitted on 08/17/2022 with  wound infection/diabetic foot Pharmacy has been consulted for cefepime and vancomycin dosing.   Plan:  1) adjust cefepime to 2 grams IV every 8 hours  2) adjust vancomycin to 1500 mg IV every 24 hours Goal AUC 400-550. Expected AUC: 506.8 Scr 1.36 mg/dL Ke 9.147 h-1, W2/9 13.2h   Height:  (190.5 cm) Weight: 113.4 kg (250 lb) IBW/kg (Calculated) : 84.5  Temp (24hrs), Avg:98.9 F (37.2 C), Min:98 F (36.7 C), Max:99.8 F (37.7 C)  Recent Labs  Lab 08/17/22 0944 08/17/22 1823 08/19/22 0453 08/19/22 1123 08/20/22 0831 08/21/22 0517  WBC 15.9*  --  12.2* 13.0* 14.2*  --   CREATININE 2.02*  --  1.50* 1.49*  --  1.36*  LATICACIDVEN 1.7 1.8  --   --   --   --      Estimated Creatinine Clearance: 65.8 mL/min (A) (by C-G formula based on SCr of 1.36 mg/dL (H)).    No Known Allergies  Antimicrobials this admission: 4/18 ceftriaxone >> 4/19 4/19 cefepime 2gm >> 4/20 vancomycin >>  Microbiology results: 4/18 BCx: NG x 3 days 4/19 Wound (left toe), E cloacae, E faecalis  Thank you for allowing pharmacy to be a part of this patient's care.  Burnis Medin, PharmD, BCPS 08/21/2022 7:04 AM

## 2022-08-21 NOTE — Progress Notes (Signed)
PODIATRY / FOOT AND ANKLE SURGERY PROGRESS NOTE  Requesting Physician: Dr. Allena Katz  Reason for consult: Left foot infection  Chief Complaint: infection   HPI: Andrew Harding is a 72 y.o. male who presents today resting in bed fairly comfortably.  Patient notes no pain today.  He also states that he is still feeling pretty good and better compared to when he came to the hospital.  Patient is kept his dressings clean, dry, and intact since yesterday's visit.  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   Tubular adenoma    Vitamin D deficiency     Surgical Hx:  Past Surgical History:  Procedure Laterality Date   AMPUTATION Left 08/19/2022   Procedure: TRANSMETATARSAL AMPUTATION LEFT FOOT WITH IRRIGATION AND DEBRIDEMENT;  Surgeon: Rosetta Posner, DPM;  Location: [facility];  Service: Podiatry;  Laterality: Left;   AMPUTATION TOE Right 06/01/2015   Procedure: AMPUTATION TOE;  Surgeon: Recardo Evangelist, DPM;  Location: [facility];  Service: Podiatry;  Laterality: Right;   AMPUTATION TOE Left 08/11/2022   Procedure: AMPUTATION TOE 2, 3, 4;  Surgeon: [name], DPM;  Location: [facility];  Service: Podiatry;  Laterality: Left;   CATARACT EXTRACTION, BILATERAL     CIRCUMCISION N/A 06/12/2022   Procedure: CIRCUMCISION ADULT;  Surgeon: Vanna Scotland, MD;  Location: [facility];  Service: Urology;  Laterality: N/A;   COLONOSCOPY WITH PROPOFOL N/A 02/14/2016   Procedure: COLONOSCOPY WITH PROPOFOL;  Surgeon: Christena Deem, MD;  Location: [facility] ENDOSCOPY;  Service: Endoscopy;  Laterality: N/A;   COLONOSCOPY WITH PROPOFOL N/A 01/07/2019    Procedure: COLONOSCOPY WITH PROPOFOL;  Surgeon: Christena Deem, MD;  Location: [facility] ENDOSCOPY;  Service: Endoscopy;  Laterality: N/A;   CORONARY ARTERY BYPASS GRAFT N/A 08/2012   EXCISION PARTIAL PHALANX Right 06/01/2015   Procedure: EXCISION PARTIAL PHALANX /  BONE;  Surgeon: Recardo Evangelist, DPM;  Location: [facility];  Service: Podiatry;  Laterality: Right;   FLEXIBLE SIGMOIDOSCOPY N/A 05/29/2016   Procedure: FLEXIBLE SIGMOIDOSCOPY;  Surgeon: Christena Deem, MD;  Location: [facility] ENDOSCOPY;  Service: Endoscopy;  Laterality: N/A;   KNEE ARTHROSCOPY Left     FHx:  Family History  Problem Relation Age of Onset   Diabetes Mellitus II Mother    CAD Mother     Social History:  reports that he has been smoking cigars. He has never used smokeless tobacco. He reports that he does not drink alcohol and does not use drugs.  Allergies: No Known Allergies  Medications Prior to Admission  Medication Sig Dispense Refill   aspirin EC 81 MG tablet Take 81 mg by mouth daily.     betamethasone dipropionate 0.05 % cream Apply topically 2 (two) times daily. 30 g 0   [EXPIRED] cefdinir (OMNICEF) 300 MG capsule Take 300 mg by mouth 2 (two) times daily.     cholecalciferol (VITAMIN D) 1000 units tablet Take 2,000 Units by mouth daily.     gabapentin (NEURONTIN) 300 MG capsule Take 300 mg by mouth 3 (three) times daily.  insulin glargine (LANTUS) 100 UNIT/ML injection Inject 52 Units into the skin at bedtime.     lisinopril (ZESTRIL) 20 MG tablet Take 20 mg by mouth daily.     metFORMIN (GLUCOPHAGE-XR) 500 MG 24 hr tablet 500 mg 2 (two) times daily with a meal.     NOVOLOG FLEXPEN 100 UNIT/ML FlexPen Inject 15-20 Units into the skin 3 (three) times daily with meals.     pravastatin (PRAVACHOL) 80 MG tablet TAKE ONE TABLET BY MOUTH AT NIGHT     silver sulfADIAZINE (SILVADENE) 1 % cream Apply topically.     traZODone (DESYREL) 50 MG tablet TAKE ONE TABLET AT BEDTIME AS NEEDED     TRULICITY 4.5  MG/0.5ML SOPN Inject into the skin.     glucose blood (ONETOUCH ULTRA) test strip Use 3 (three) times daily     Insulin Pen Needle (FIFTY50 PEN NEEDLES) 32G X 4 MM MISC USE 4 TIMES DAILY      Physical Exam: General: Alert and oriented.  No apparent distress.  Vascular: DP/PT pulses palpable bilateral, capillary fill time intact to digits and flap of left foot.  Increased redness and swelling present to the left forefoot at the level of the amputation sites.  Neuro: Light touch sensation reduced to bilateral lower extremities.  Derm: Incision sites to the left foot and ankle appear to be well coapted overall.  Portions of the incision plantarly and at the transmetatarsal amputation site were left open for drainage purposes.  Still appears to be draining a fair amount of serous drainage, no obvious purulence or palpable fluctuance present today.  Patient still appears to have some erythema and purplish hue present around the plantar flap area of the transmetatarsal amputation site.  Appears to be slightly worsening compared to yesterday.  No obvious palpable fluctuance or gaseous odors noted on examination today.      MSK: Right hallux amputation.  Left transmetatarsal amputation.  Results for orders placed or performed during the hospital encounter of 08/17/22 (from the past 48 hour(s))  Glucose, capillary     Status: Abnormal   Collection Time: 08/19/22  3:33 PM  Result Value Ref Range   Glucose-Capillary 152 (H) 70 - 99 mg/dL    Comment: Glucose reference range applies only to samples taken after fasting for at least 8 hours.  Glucose, capillary     Status: Abnormal   Collection Time: 08/19/22  7:49 PM  Result Value Ref Range   Glucose-Capillary 180 (H) 70 - 99 mg/dL    Comment: Glucose reference range applies only to samples taken after fasting for at least 8 hours.  Glucose, capillary     Status: None   Collection Time: 08/20/22  7:46 AM  Result Value Ref Range    Glucose-Capillary 90 70 - 99 mg/dL    Comment: Glucose reference range applies only to samples taken after fasting for at least 8 hours.   Comment 1 Notify RN    Comment 2 Document in Chart   CBC     Status: Abnormal   Collection Time: 08/20/22  8:31 AM  Result Value Ref Range   WBC 14.2 (H) 4.0 - 10.5 K/uL   RBC 2.86 (L) 4.22 - 5.81 MIL/uL   Hemoglobin 8.8 (L) 13.0 - 17.0 g/dL   HCT 16.1 (L) 09.6 - 04.5 %   MCV 93.4 80.0 - 100.0 fL   MCH 30.8 26.0 - 34.0 pg   MCHC 33.0 30.0 - 36.0 g/dL   RDW 40.9 81.1 -  15.5 %   Platelets 282 150 - 400 K/uL   nRBC 0.0 0.0 - 0.2 %    Comment: Performed at [facility], [address] [place], [place] [address]  Glucose, capillary     Status: Abnormal   Collection Time: 08/20/22 11:30 AM  Result Value Ref Range   Glucose-Capillary 110 (H) 70 - 99 mg/dL    Comment: Glucose reference range applies only to samples taken after fasting for at least 8 hours.   Comment 1 Notify RN    Comment 2 Document in Chart   Glucose, capillary     Status: Abnormal   Collection Time: 08/20/22  4:41 PM  Result Value Ref Range   Glucose-Capillary 62 (L) 70 - 99 mg/dL    Comment: Glucose reference range applies only to samples taken after fasting for at least 8 hours.  Glucose, capillary     Status: Abnormal   Collection Time: 08/20/22  6:07 PM  Result Value Ref Range   Glucose-Capillary 101 (H) 70 - 99 mg/dL    Comment: Glucose reference range applies only to samples taken after fasting for at least 8 hours.  Glucose, capillary     Status: None   Collection Time: 08/20/22  9:57 PM  Result Value Ref Range   Glucose-Capillary 97 70 - 99 mg/dL    Comment: Glucose reference range applies only to samples taken after fasting for at least 8 hours.  Creatinine, serum     Status: Abnormal   Collection Time: 08/21/22  5:17 AM  Result Value Ref Range   Creatinine, Ser 1.36 (H) 0.61 - 1.24 mg/dL   GFR, Estimated 55 (L) >60 mL/min    Comment: (NOTE) Calculated  using the CKD-EPI Creatinine Equation (2021) Performed at [facility], [address] [place], [place] [address]   Glucose, capillary     Status: Abnormal   Collection Time: 08/21/22  8:15 AM  Result Value Ref Range   Glucose-Capillary 63 (L) 70 - 99 mg/dL    Comment: Glucose reference range applies only to samples taken after fasting for at least 8 hours.  CBC     Status: Abnormal   Collection Time: 08/21/22  9:00 AM  Result Value Ref Range   WBC 15.9 (H) 4.0 - 10.5 K/uL   RBC 3.00 (L) 4.22 - 5.81 MIL/uL   Hemoglobin 9.2 (L) 13.0 - 17.0 g/dL   HCT 09.8 (L) 11.9 - 14.7 %   MCV 92.7 80.0 - 100.0 fL   MCH 30.7 26.0 - 34.0 pg   MCHC 33.1 30.0 - 36.0 g/dL   RDW 82.9 56.2 - 13.0 %   Platelets 331 150 - 400 K/uL   nRBC 0.0 0.0 - 0.2 %    Comment: Performed at [facility], [address] [place], [place] [address]  Glucose, capillary     Status: Abnormal   Collection Time: 08/21/22 12:13 PM  Result Value Ref Range   Glucose-Capillary 120 (H) 70 - 99 mg/dL    Comment: Glucose reference range applies only to samples taken after fasting for at least 8 hours.   DG Foot Complete Left  Result Date: 08/19/2022 CLINICAL DATA:  Postop. EXAM: LEFT FOOT - COMPLETE 3+ VIEW COMPARISON:  Preoperative exam earlier today FINDINGS: Interval transmetatarsal amputation of all 5 rays. Presumed antibiotic beads projecting over the mid metatarsals as well as the ankle at the level of the medial malleolus. Overlying skin staples in place. IMPRESSION: Interval transmetatarsal amputation of all 5  rays. Presumed antibiotic beads projecting over the mid metatarsals and medial ankle. Electronically Signed   By: Narda Rutherford M.D.   On: 08/19/2022 16:48    Blood pressure 132/64, pulse 87, temperature 98.6 F (37 C), resp. rate 18, height 6\' 3"  (1.905 m), weight 113.4 kg, SpO2 98 %.  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 Diabetes type 2 polyneuropathy  Plan -Patient seen and examined -Postoperative x-ray imaging reviewed.  No further gas present in tissues that is obvious. -Incision sites overall appear to be well coapted.  Still appears to have a large amount of serous drainage coming from the transmetatarsal amputation site as well as midfoot incision plantarly.  Still appears to have some erythema and purplish type discoloration present to the plantar flap of the transmetatarsal amputation site but capillary fill time is still intact to the area.  Will need continued close monitoring.  No obvious palpable fluctuance today or gaseous odor present. -Removed packing gauze today.  Reapplied packing gauze to the plantar midfoot incision as well as transmetatarsal amputation site.  Applied 4 x 4 gauze with Betadine, ABD, Kerlix, Ace wrap. -Continue nonweightbearing at all times to the left lower extremity. -Appreciate medicine recommendations for IV antibiotic therapy.  Continue IV antibiotics.   -Appreciate vascular recommendations.  Patient did appear to have a fair amount of bleeding and tissues appear to be fairly healthy overall but still would recommend further workup as patient does have severe arterial calcification.  Planning for angiogram in the next couple of days. -Appreciate PT/OT recommendations.  Could consider SNF -Appears to be slightly worsened overall today compared to yesterday.  Patient may require further incision and drainage with removal of any nonviable necrotic tissues with potentially delayed primary closure.  Will assess again over the next couple of days and determine if this is necessary.  Patient remains at high risk for limb loss due to severity of infection.  Rosetta Posner, DPM 08/21/2022, 1:23 PM

## 2022-08-21 NOTE — Consult Note (Signed)
NAME: Andrew Harding  DOB: May 18, 1950  MRN: [id number]  Date/Time: 08/21/2022 12:06 PM  REQUESTING PROVIDER: Dr.PAtel Subjective:  REASON FOR CONSULT: left foot infection ? [name] is a 72 y.o. with a history of diabetes mellitus, CAD, status post CABG peripheral neuropathy, hypertension, right foot infection with amputation of the great toe in 2017 is followed by podiatrist for ulcer on the left foot.  Patient sustained thermal injury from being close to a heater in feb/march and was given antibiotics with progression and development of  gangrene of the left second third and fourth toes and underwent amputation of the left second toe MTP joint and amputation of the left third toe MTP joint and amputation of the left fourth toe partial on 08/11/2022.  No culture was sent at that time. He was following at podiatrist office and on 08/17/2022 was asked to get admitted to the hospital because of fatigue and no energy.Marland Kitchen  08/17/22  BP 153/75 !  Temp 98.4 F (36.9 Harding)  Pulse Rate 106 !  Resp 18  SpO2 100 %  labs  Latest Reference Range & Units 08/17/22  WBC 4.0 - 10.5 K/uL 15.9 (H)  Hemoglobin 13.0 - 17.0 g/dL 65.7 (L)  HCT 84.6 - 96.2 % 34.8 (L)  Platelets 150 - 400 K/uL 372  Creatinine 0.61 - 1.24 mg/dL 9.52 (H)   MRI done on the foot showed possible osteomyelitis  started on borad spectrum antibiotic vanco and cefepime . 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   Tubular adenoma    Vitamin D deficiency     Past Surgical History:  Procedure Laterality Date   AMPUTATION Left 08/19/2022   Procedure: TRANSMETATARSAL AMPUTATION LEFT FOOT WITH IRRIGATION AND DEBRIDEMENT;  Surgeon: Rosetta Posner, DPM;  Location: [facility] ORS;  Service: Podiatry;  Laterality: Left;   AMPUTATION TOE Right 06/01/2015   Procedure: AMPUTATION TOE;  Surgeon: Recardo Evangelist, DPM;  Location: [facility];  Service: Podiatry;  Laterality: Right;   AMPUTATION TOE Left 08/11/2022   Procedure: AMPUTATION TOE 2, 3, 4;  Surgeon: [name], DPM;  Location: [facility];  Service: Podiatry;  Laterality: Left;   CATARACT EXTRACTION, BILATERAL     CIRCUMCISION N/A 06/12/2022   Procedure: CIRCUMCISION ADULT;  Surgeon: Vanna Scotland, MD;  Location: [facility];  Service: Urology;  Laterality: N/A;   COLONOSCOPY WITH PROPOFOL N/A 02/14/2016   Procedure: COLONOSCOPY WITH PROPOFOL;  Surgeon: Christena Deem, MD;  Location: [facility] ENDOSCOPY;  Service: Endoscopy;  Laterality: N/A;   COLONOSCOPY WITH PROPOFOL N/A 01/07/2019   Procedure: COLONOSCOPY WITH PROPOFOL;  Surgeon: Christena Deem, MD;  Location: [facility] ENDOSCOPY;  Service: Endoscopy;  Laterality: N/A;   CORONARY ARTERY BYPASS GRAFT N/A 08/2012   EXCISION PARTIAL PHALANX Right 06/01/2015   Procedure: EXCISION PARTIAL PHALANX /  BONE;  Surgeon:  Recardo Evangelist, DPM;  Location: [facility];  Service: Podiatry;  Laterality: Right;   FLEXIBLE SIGMOIDOSCOPY N/A 05/29/2016   Procedure: FLEXIBLE SIGMOIDOSCOPY;  Surgeon: Christena Deem, MD;  Location: [facility] ENDOSCOPY;  Service: Endoscopy;  Laterality: N/A;   KNEE ARTHROSCOPY Left     Social History   Socioeconomic History   Marital status: Married    Spouse name: Andrew Harding,Andrew Harding   Number of children: Not on file   Years of education: Not on file   Highest education level: Not on file  Occupational History   Not on file  Tobacco Use   Smoking status: Some Days    Types: Cigars    Smokeless tobacco: Never  Vaping Use   Vaping Use: Never used  Substance and Sexual Activity   Alcohol use: No   Drug use: No   Sexual activity: Never  Other Topics Concern   Not on file  Social History Narrative   Lives at home by himself. Independent at baseline   Social Determinants of Health   Financial Resource Strain: Not on file  Food Insecurity: No Food Insecurity (08/19/2022)   Hunger Vital Sign    Worried About Running Out of Food in the Last Year: Never true    Ran Out of Food in the Last Year: Never true  Transportation Needs: No Transportation Needs (08/19/2022)   PRAPARE - Administrator, Civil Service (Medical): No    Lack of Transportation (Non-Medical): No  Physical Activity: Not on file  Stress: Not on file  Social Connections: Not on file  Intimate Partner Violence: Not At Risk (08/19/2022)   Humiliation, Afraid, Rape, and Kick questionnaire    Fear of Current or Ex-Partner: No    Emotionally Abused: No    Physically Abused: No    Sexually Abused: No    Family History  Problem Relation Age of Onset   Diabetes Mellitus II Mother    CAD Mother    No Known Allergies I? Current Facility-Administered Medications  Medication Dose Route Frequency Provider Last Rate Last Admin   0.9 %  sodium chloride infusion   Intravenous PRN Enedina Finner, MD   Stopped at 08/21/22 0007   acetaminophen (TYLENOL) tablet 650 mg  650 mg Oral Q6H PRN Rosetta Posner, DPM   650 mg at 08/20/22 0549   Or   acetaminophen (TYLENOL) suppository 650 mg  650 mg Rectal Q6H PRN Rosetta Posner, DPM       aspirin EC tablet 81 mg  81 mg Oral Daily Rosetta Posner, DPM   81 mg at 08/21/22 0815   bisacodyl (DULCOLAX) suppository 10 mg  10 mg Rectal Daily PRN Rosetta Posner, DPM       ceFEPIme (MAXIPIME) 2 g in sodium chloride 0.9 % 100 mL IVPB  2 g Intravenous Q8H Burnis Medin D, RPH       cholecalciferol (VITAMIN D3) 25 MCG (1000 UNIT) tablet 2,000 Units  2,000 Units Oral Daily Rosetta Posner, DPM   2,000 Units at 08/21/22 4098   docusate sodium (COLACE) capsule 100 mg  100 mg Oral BID Rosetta Posner, DPM   100 mg at 08/21/22 0814   feeding supplement (ENSURE ENLIVE / ENSURE PLUS) liquid 237 mL  237 mL Oral BID BM Rosetta Posner, DPM   237 mL at 08/18/22 1404   gabapentin (NEURONTIN) capsule 300 mg  300 mg Oral TID Rosetta Posner, DPM   300 mg at 08/21/22 0814   heparin ADULT infusion 100 units/mL (25000  units/250mL)  1,700 Units/hr Intravenous Continuous Lowella Bandy, RPH       heparin bolus via infusion 4,000 Units  4,000 Units Intravenous Once Lowella Bandy, RPH       insulin aspart (novoLOG) injection 0-5 Units  0-5 Units Subcutaneous QHS Rosetta Posner, DPM   3 Units at 08/18/22 2209   insulin aspart (novoLOG) injection 0-9 Units  0-9 Units Subcutaneous TID WC Rosetta Posner, DPM   2 Units at 08/19/22 0913   insulin glargine-yfgn (SEMGLEE) injection 50 Units  50 Units Subcutaneous QHS Rosetta Posner, DPM   50 Units at 08/20/22 2158   metoprolol tartrate (LOPRESSOR) tablet 12.5 mg  12.5 mg Oral BID Tang, Cheryln Manly, PA-Harding       ondansetron [facility]) tablet 4 mg  4 mg Oral Q6H PRN Rosetta Posner, DPM       Or   ondansetron [facility]) injection 4 mg  4 mg Intravenous Q6H PRN Rosetta Posner, DPM       Oral care mouth rinse  15 mL Mouth Rinse PRN Enedina Finner, MD       pravastatin (PRAVACHOL) tablet 80 mg  80 mg Oral q1800 Rosetta Posner, DPM   80 mg at 08/20/22 1641   traZODone (DESYREL) tablet 25 mg  25 mg Oral QHS PRN Rosetta Posner, DPM   25 mg at 08/20/22 2149   vancomycin (VANCOREADY) IVPB 1500 mg/300 mL  1,500 mg Intravenous Q24H Lowella Bandy, RPH         Abtx:  Anti-infectives (From admission, onward)    Start     Dose/Rate Route Frequency Ordered Stop   08/21/22 1400  ceFEPIme (MAXIPIME) 2 g in sodium chloride 0.9 % 100 mL IVPB        2 g 200 mL/hr over 30 Minutes Intravenous Every 8 hours 08/21/22 1045     08/21/22 1200  vancomycin (VANCOREADY) IVPB 1500 mg/300 mL         1,500 mg 150 mL/hr over 120 Minutes Intravenous Every 24 hours 08/21/22 1044     08/20/22 1000  vancomycin (VANCOREADY) IVPB 1250 mg/250 mL  Status:  Discontinued        1,250 mg 166.7 mL/hr over 90 Minutes Intravenous Every 24 hours 08/19/22 1955 08/21/22 1044   08/19/22 1424  vancomycin (VANCOCIN) powder  Status:  Discontinued          As needed 08/19/22 1424 08/19/22 1537   08/19/22 1000  vancomycin (VANCOREADY) IVPB 2000 mg/400 mL        2,000 mg 200 mL/hr over 120 Minutes Intravenous  Once 08/19/22 0859 08/19/22 1230   08/18/22 1300  cefTRIAXone (ROCEPHIN) 2 g in sodium chloride 0.9 % 100 mL IVPB  Status:  Discontinued        2 g 200 mL/hr over 30 Minutes Intravenous Every 24 hours 08/17/22 1520 08/18/22 1042   08/18/22 1200  ceFEPIme (MAXIPIME) 2 g in sodium chloride 0.9 % 100 mL IVPB  Status:  Discontinued        2 g 200 mL/hr over 30 Minutes Intravenous Every 12 hours 08/18/22 1042 08/21/22 1045   08/17/22 1230  vancomycin (VANCOCIN) IVPB 1000 mg/200 mL premix        1,000 mg 200 mL/hr over 60 Minutes Intravenous  Once 08/17/22 1227 08/17/22 1426   08/17/22 1230  cefTRIAXone (ROCEPHIN) 2 g in sodium chloride 0.9 % 100 mL IVPB        2 g 200 mL/hr over 30 Minutes Intravenous  Once 08/17/22  1227 08/17/22 1409       REVIEW OF SYSTEMS:  Const: he had fever on presentation Eyes: negative diplopia or visual changes, negative eye pain ENT: negative coryza, negative sore throat Resp: negative cough, hemoptysis, dyspnea Cards: negative for chest pain, palpitations, lower extremity edema GU: some LUTS GI: Negative for abdominal pain, diarrhea, bleeding, constipation Skin: negative for rash and pruritus Heme: negative for easy bruising and gum/nose bleeding MS: general weakness Neurolo:peripheral neuropathy Psych: negative for feelings of anxiety, depression  Endocrine: , diabetes Allergy/Immunology- negative for any medication or food allergies ?  Objective:  VITALS:   BP 132/64 (BP Location: Right Arm)   Pulse 87   Temp 98.6 F (37 Harding)   Resp 18   Ht  (1.905 m)   Wt 113.4 kg   SpO2 98%   BMI 31.25 kg/m   PHYSICAL EXAM:  General: Alert, cooperative, no distress, appears stated age.  Head: Normocephalic, without obvious abnormality, atraumatic. Eyes: Conjunctivae clear, anicteric sclerae. Pupils are equal ENT Nares normal. No drainage or sinus tenderness. Lips, mucosa, and tongue normal. No Thrush Neck: Supple, symmetrical, no adenopathy, thyroid: non tender no carotid bruit and no JVD. Back: No CVA tenderness. Lungs: Clear to auscultation bilaterally. No Wheezing or Rhonchi. No rales. Heart: Regular rate and rhythm, no murmur, rub or gallop. Abdomen: Soft, non-tender,not distended. Bowel sounds normal. No masses Extremities: left foot pictures reviewed      4/19     Rt great toe amputated- foot looks good Skin: No rashes or lesions. Or bruising Lymph: Cervical, supraclavicular normal. Neurologic: peripheral neuropathy Pertinent Labs Lab Results CBC    Component Value Date/Time   WBC 15.9 (H) 08/21/2022 0900   RBC 3.00 (L) 08/21/2022 0900   HGB 9.2 (L) 08/21/2022 0900   HCT 27.8 (L) 08/21/2022 0900   PLT 331 08/21/2022 0900   MCV 92.7 08/21/2022 0900   MCH 30.7 08/21/2022 0900   MCHC 33.1 08/21/2022 0900   RDW 12.8 08/21/2022 0900   LYMPHSABS 0.7 08/17/2022 0944   MONOABS 0.7 08/17/2022 0944   EOSABS 0.0 08/17/2022 0944   BASOSABS 0.0 08/17/2022 0944       Latest Ref Rng & Units 08/21/2022    5:17 AM 08/19/2022   11:23 AM 08/19/2022    4:53 AM  CMP  Glucose 70 - 99 mg/dL  403  474   BUN 8 - 23 mg/dL  18  19   Creatinine 2.59 - 1.24 mg/dL 5.63  8.75  6.43   Sodium 135 - 145 mmol/L  134  133   Potassium 3.5 - 5.1 mmol/L  3.9  4.0   Chloride 98 - 111 mmol/L  104  103   CO2 22 - 32 mmol/L  23  21   Calcium 8.9 - 10.3 mg/dL  8.1  8.0   Total Protein 6.5 - 8.1 g/dL  6.6    Total Bilirubin 0.3 - 1.2 mg/dL  0.9     Alkaline Phos 38 - 126 U/L  127    AST 15 - 41 U/L  126    ALT 0 - 44 U/L  153        Microbiology: Recent Results (from the past 240 hour(s))  Culture, blood (Routine x 2)     Status: None (Preliminary result)   Collection Time: 08/17/22  9:44 AM   Specimen: BLOOD RIGHT FOREARM  Result Value Ref Range Status   Specimen Description BLOOD RIGHT FOREARM  Final   Special Requests  Final    BOTTLES DRAWN AEROBIC AND ANAEROBIC Blood Culture adequate volume   Culture   Final    NO GROWTH 3 DAYS Performed at [facility], [address] [place], [place] [address]    Report Status PENDING  Incomplete  Culture, blood (Routine x 2)     Status: None (Preliminary result)   Collection Time: 08/17/22  9:44 AM   Specimen: BLOOD RIGHT WRIST  Result Value Ref Range Status   Specimen Description BLOOD RIGHT WRIST  Final   Special Requests   Final    BOTTLES DRAWN AEROBIC AND ANAEROBIC Blood Culture results may not be optimal due to an inadequate volume of blood received in culture bottles   Culture   Final    NO GROWTH 3 DAYS Performed at [facility], [address] [place], [place] [address]    Report Status PENDING  Incomplete  Aerobic Culture w Gram Stain (superficial specimen)     Status: None   Collection Time: 08/18/22 10:39 AM   Specimen: Wound  Result Value Ref Range Status   Specimen Description   Final    WOUND Performed at [facility], [address] [place], [place] [address]    Special Requests   Final    LEFT TOE Performed at [facility], [address] [place], [place] [address]    Gram Stain   Final    FEW WBC PRESENT, PREDOMINANTLY PMN MODERATE GRAM NEGATIVE RODS FEW GRAM POSITIVE COCCI Performed at [facility], [address] [place], [place] [address]    Culture   Final    MODERATE ENTEROBACTER CLOACAE MODERATE ENTEROCOCCUS FAECALIS    Report Status 08/21/2022 FINAL  Final   Organism ID, Bacteria  ENTEROBACTER CLOACAE  Final   Organism ID, Bacteria ENTEROCOCCUS FAECALIS  Final      Susceptibility   Enterobacter cloacae - MIC*    CEFEPIME <=0.12 SENSITIVE Sensitive     CEFTAZIDIME <=1 SENSITIVE Sensitive     CIPROFLOXACIN <=0.25 SENSITIVE Sensitive     GENTAMICIN <=1 SENSITIVE Sensitive     IMIPENEM 1 SENSITIVE Sensitive     TRIMETH/SULFA >=320 RESISTANT Resistant     PIP/TAZO <=4 SENSITIVE Sensitive     * MODERATE ENTEROBACTER CLOACAE   Enterococcus faecalis - MIC*    AMPICILLIN <=2 SENSITIVE Sensitive     VANCOMYCIN 1 SENSITIVE Sensitive     GENTAMICIN SYNERGY SENSITIVE Sensitive     * MODERATE ENTEROCOCCUS FAECALIS    IMAGING RESULTS: MRI reviewed Possible osteo of the toes amputated site I have personally reviewed the films ? Impression/Recommendation ? Diabetic foot infection with peripheral neuropathy . Sustained thermal injury form heater leading to infection and amputation of 2/3/4 toe on 08/11/22.  HE was on omnicef then He had progressive infection and had to undergo TMA on 08/19/22 Culture enterobacter and enterococcus- currently on vanco and cefepime- will change to zosyn He is going to need IV antibiotics on discharge He will be getting angio tomorrow and further debridement of the wound  CKD  DM- on insulin  Transaminitis Could be meds R/o fatty liver Will check hepatitis panel  Anemia CAD s/p CABG H/o rt great infection s/p amputation ? ___________________________________________________ Discussed with patient,and with Dr.Fowler Note:  This document was prepared using Dragon voice recognition software and may include unintentional dictation errors.

## 2022-08-21 NOTE — Progress Notes (Signed)
[facility]  - [place] at [facility]   PATIENT NAME: Andrew Harding    MR#:  [id number]  DATE OF BIRTH:  [birth year]  SUBJECTIVE:  Per RN patient tried to go to the bathroom without assistance and fell. No injury reported. Patient did not pass out. Wife at bedside. Sugars stable  VITALS:  Blood pressure 132/64, pulse 87, temperature 98.6 F (37 C), resp. rate 18, height  (1.905 m), weight 113.4 kg, SpO2 98 %.  PHYSICAL EXAMINATION:   GENERAL:  72 y.o.-year-old patient with no acute distress.  LUNGS: Normal breath sounds bilaterally, no wheezing CARDIOVASCULAR: S1, S2 normal. No murmur   ABDOMEN: Soft, nontender, nondistended. Bowel sounds present.  EXTREMITIES: From  08/18/2022  08/20/2022--  NEUROLOGIC: nonfocal  patient is alert and awake, peripheral neuropathy SKIN: as above  LABORATORY PANEL:  CBC Recent Labs  Lab 08/21/22 0900  WBC 15.9*  HGB 9.2*  HCT 27.8*  PLT 331     Chemistries  Recent Labs  Lab 08/17/22 0944 08/19/22 0453 08/19/22 1123 08/21/22 0517  NA 135   < > 134*  --   K 3.4*   < > 3.9  --   CL 100   < > 104  --   CO2 24   < > 23  --   GLUCOSE 146*   < > 157*  --   BUN 29*   < > 18  --   CREATININE 2.02*   < > 1.49* 1.36*  CALCIUM 9.0   < > 8.1*  --   MG 1.3*  --   --   --   AST 244*  --  126*  --   ALT 181*  --  153*  --   ALKPHOS 132*  --  127*  --   BILITOT 0.9  --  0.9  --    < > = values in this interval not displayed.    Cardiac Enzymes No results for input(s): "TROPONINI" in the last 168 hours. RADIOLOGY:  DG Foot Complete Left  Result Date: 08/19/2022 CLINICAL DATA:  Postop. EXAM: LEFT FOOT - COMPLETE 3+ VIEW COMPARISON:  Preoperative exam earlier today FINDINGS: Interval transmetatarsal amputation of all 5 rays. Presumed antibiotic beads projecting over the mid metatarsals as well as the ankle at the level of the medial malleolus. Overlying skin staples in place. IMPRESSION: Interval transmetatarsal amputation of  all 5 rays. Presumed antibiotic beads projecting over the mid metatarsals and medial ankle. Electronically Signed   By: Narda Rutherford M.D.   On: 08/19/2022 16:48   DG Foot 2 Views Left  Result Date: 08/19/2022 CLINICAL DATA:  Infection, patient reports recent surgery. EXAM: LEFT FOOT - 2 VIEW COMPARISON:  Foot MRI yesterday FINDINGS: Prior amputation of the second and third toes as well as fourth toe at the proximal aspect of the proximal phalanx. Focal defect involving the residual fourth toe proximal phalanx as seen on prior MRI. No definite radiographic correlate to the edematous changes in the second and third metatarsal heads on MRI. No evidence of acute fracture. Soft tissue edema is seen over the dorsum of the forefoot. Vascular calcifications. IMPRESSION: 1. Prior amputation of the second and third toes as well as fourth toe at the proximal phalanx. Small defect in the fourth toe proximal phalanx corresponding to MRI findings. No radiographic correlate to the marrow edema changes in the second and third metatarsal heads on MRI. 2. Forefoot soft tissue edema. Electronically Signed   By: Shawna Orleans  Sanford M.D.   On: 08/19/2022 16:45    Assessment and Plan  Andrew Harding is a 72 y.o. 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 Irene Limbo office after he went for wound check and noted upside was swollen and had erythema. Patient also had a syncopal episode couple days ago at home. He tells me he is not been his usual baseline and thinks he is not eating and drinking well.   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 -- Received IV  vancomycin and Rocephin-- will change to IV cefepime +Vacnomycin -- podiatry consultation with Dr. Lodema Pilot Baker--recommends TMT with possible further exploration --WC FEW WBC PRESENT, PREDOMINANTLY PMN  MODERATE GRAM NEGATIVE RODS  FEW GRAM POSITIVE COCCI  --BC negative --ID consultation on Monday --s/p Left foot transmetatarsal amputation on 08/19/22 Left foot and ankle incision and drainage with removal of all nonviable necrotic tissue. Application of antibiotic beads --vascular surgery  consult noted--Angiogram tomorrow to see Left LE circulation  Atrial fibrillation, New, rate controlled H/o CAD,s/p CABG x1 in 2014 --repeat EKG showed Afib -- patient denies chest pain shortness of breath and palpitations -anesthesia requires cardiology clearance. -- Discussed with cardiology Dr. Welton Flakes--- recommends proceeding with surgery   Uncontrolled type II diabetes with peripheral neuropathy and CKD stage IIIB -- baseline creatinine around 1.4 -- creatinine 2.02-- 1.4 -- continue insulin and sliding scale -- Pt counselled on carb control diet -- patient at home on Lantus, metformin,Trulicity --A1c 6.9%   Hypotension with syncopal episode in the setting of pre-renal azotemia in post-op period -- CT head negative -- CT chest negative for PE -- blood pressure improved with IV fluids   CKD stage IIIB -- as above   Hyperlipidemia -- continue statins      Advance Care Planning:   Code Status: Full Code   Consults: Podiatry  Family Communication:  none today DVT Prophylaxis :lovenox Level of care: Med-Surg Status is: Inpatient Remains inpatient appropriate because: foot infection    TOTAL TIME TAKING CARE OF THIS PATIENT: 35 minutes.  >50% time spent on counselling and coordination of care  Note: This dictation was prepared with Dragon dictation along with smaller phrase technology. Any transcriptional errors that result from this process are unintentional.  Enedina Finner M.D     [facility]   CC: Primary care physician; Lynnea Ferrier, MD

## 2022-08-21 NOTE — Progress Notes (Cosign Needed Addendum)
[facility] CLINIC CARDIOLOGY CONSULT NOTE       Patient ID: Andrew DEVINCENT MRN: [id number] DOB/AGE: 1950-10-25 72 y.o.  Admit date: 08/17/2022 Referring Physician Dr. Enedina Finner Primary Physician Dr. Daniel Nones  Primary Cardiologist previous Dr. Gwen Pounds  Reason for Consultation new AF  HPI: Andrew Harding. Dimond is a 72yoM with a PMH of CAD s/p CABG x 4 (2014, [facility]), PAD, DM2, who presented to [facility] ED 08/17/2022 from his podiatrist office worsening swelling and erythema to his left foot.  He is now s/p Left TMA x 3 on 4/20 with podiatry with ongoing treatment for cellulitis and wound necrosis. Vascular surgery planning LE angiogram on 4/23. Cardiology is following for new onset paroxysmal AF discovered on admission EKG.   Interval History:  - feels ok today, wife at bedside - vascular surgery planning for LE angiogram tomorrow - denies chest pain, palpitations, orthopnea, or LE swelling - noticed some dyspnea on exertion recently + gait instability at home that has worsened since his toe amputation  Review of systems complete and found to be negative unless listed above     Past Medical History:  Diagnosis Date   Bladder neck obstruction    Chronic kidney disease    Coronary artery disease    a.) s/p 4v CABG in 2014   Diabetes mellitus without complication    Diabetic neuropathy    Diabetic peripheral neuropathy    Diverticulosis    Gout    Heart murmur    Hypercholesteremia    Hyperlipidemia    Hypertension    Peripheral neuropathy    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   Tubular adenoma    Vitamin D deficiency     Past Surgical History:  Procedure Laterality Date   AMPUTATION Left 08/19/2022   Procedure: TRANSMETATARSAL AMPUTATION LEFT FOOT WITH IRRIGATION AND DEBRIDEMENT;  Surgeon: Rosetta Posner, DPM;  Location: [facility] ORS;  Service: Podiatry;  Laterality: Left;   AMPUTATION TOE Right 06/01/2015   Procedure: AMPUTATION TOE;  Surgeon: Recardo Evangelist, DPM;  Location: [facility] ORS;  Service: Podiatry;   Laterality: Right;   AMPUTATION TOE Left 08/11/2022   Procedure: AMPUTATION TOE 2, 3, 4;  Surgeon: [name], DPM;  Location: [facility] ORS;  Service: Podiatry;  Laterality: Left;   CATARACT EXTRACTION, BILATERAL     CIRCUMCISION N/A 06/12/2022   Procedure: CIRCUMCISION ADULT;  Surgeon: Vanna Scotland, MD;  Location: [facility] ORS;  Service: Urology;  Laterality: N/A;   COLONOSCOPY WITH PROPOFOL N/A 02/14/2016   Procedure: COLONOSCOPY WITH PROPOFOL;  Surgeon: Christena Deem, MD;  Location: [facility] ENDOSCOPY;  Service: Endoscopy;  Laterality: N/A;   COLONOSCOPY WITH PROPOFOL N/A 01/07/2019   Procedure: COLONOSCOPY WITH PROPOFOL;  Surgeon: Christena Deem, MD;  Location: [facility] ENDOSCOPY;  Service: Endoscopy;  Laterality: N/A;   CORONARY ARTERY BYPASS GRAFT N/A 08/2012   EXCISION PARTIAL PHALANX Right 06/01/2015   Procedure: EXCISION PARTIAL PHALANX /  BONE;  Surgeon: Recardo Evangelist, DPM;  Location: [facility] ORS;  Service: Podiatry;  Laterality: Right;   FLEXIBLE SIGMOIDOSCOPY N/A 05/29/2016   Procedure: FLEXIBLE SIGMOIDOSCOPY;  Surgeon: Christena Deem, MD;  Location[facility] ENDOSCOPY;  Service: Endoscopy;  Laterality: N/A;   KNEE ARTHROSCOPY Left     Medications Prior to Admission  Medication Sig Dispense Refill Last Dose   aspirin EC 81 MG tablet Take 81 mg by mouth daily.   08/17/2022   betamethasone dipropionate 0.05 % cream Apply topically 2 (two) times daily. 30 g 0 Past Week   [EXPIRED]  cefdinir (OMNICEF) 300 MG capsule Take 300 mg by mouth 2 (two) times daily.   08/17/2022   cholecalciferol (VITAMIN D) 1000 units tablet Take 2,000 Units by mouth daily.   08/17/2022   gabapentin (NEURONTIN) 300 MG capsule Take 300 mg by mouth 3 (three) times daily.   08/17/2022   insulin glargine (LANTUS) 100 UNIT/ML injection Inject 52 Units into the skin at bedtime.   08/16/2022   lisinopril (ZESTRIL) 20 MG tablet Take 20 mg by mouth daily.   08/17/2022   metFORMIN (GLUCOPHAGE-XR) 500 MG 24 hr tablet 500 mg 2  (two) times daily with a meal.   08/17/2022   NOVOLOG FLEXPEN 100 UNIT/ML FlexPen Inject 15-20 Units into the skin 3 (three) times daily with meals.   08/17/2022   pravastatin (PRAVACHOL) 80 MG tablet TAKE ONE TABLET BY MOUTH AT NIGHT   08/16/2022   silver sulfADIAZINE (SILVADENE) 1 % cream Apply topically.   Past Week   traZODone (DESYREL) 50 MG tablet TAKE ONE TABLET AT BEDTIME AS NEEDED   08/16/2022   TRULICITY 4.5 MG/0.5ML SOPN Inject into the skin.   08/13/2022   glucose blood (ONETOUCH ULTRA) test strip Use 3 (three) times daily      Insulin Pen Needle (FIFTY50 PEN NEEDLES) 32G X 4 MM MISC USE 4 TIMES DAILY      Social History   Socioeconomic History   Marital status: Married    Spouse name: Ashley,Angela C   Number of children: Not on file   Years of education: Not on file   Highest education level: Not on file  Occupational History   Not on file  Tobacco Use   Smoking status: Some Days    Types: Cigars   Smokeless tobacco: Never  Vaping Use   Vaping Use: Never used  Substance and Sexual Activity   Alcohol use: No   Drug use: No   Sexual activity: Never  Other Topics Concern   Not on file  Social History Narrative   Lives at home by himself. Independent at baseline   Social Determinants of Health   Financial Resource Strain: Not on file  Food Insecurity: No Food Insecurity (08/19/2022)   Hunger Vital Sign    Worried About Running Out of Food in the Last Year: Never true    Ran Out of Food in the Last Year: Never true  Transportation Needs: No Transportation Needs (08/19/2022)   PRAPARE - Administrator, Civil Service (Medical): No    Lack of Transportation (Non-Medical): No  Physical Activity: Not on file  Stress: Not on file  Social Connections: Not on file  Intimate Partner Violence: Not At Risk (08/19/2022)   Humiliation, Afraid, Rape, and Kick questionnaire    Fear of Current or Ex-Partner: No    Emotionally Abused: No    Physically Abused: No     Sexually Abused: No    Family History  Problem Relation Age of Onset   Diabetes Mellitus II Mother    CAD Mother       Intake/Output Summary (Last 24 hours) at 08/21/2022 1137 Last data filed at 08/21/2022 1005 Gross per 24 hour  Intake 1549.54 ml  Output 850 ml  Net 699.54 ml    Vitals:   08/20/22 2212 08/21/22 0006 08/21/22 0408 08/21/22 0816  BP: 108/62 125/63 128/60 132/64  Pulse: (!) 105 92 76 87  Resp: Temp: 99.8 F (37.7 C) 98.9 F (37.2 C) 98.7 F (37.1 C)  98.6 F (37 C)  TempSrc:      SpO2: 97% 93% 99% 98%  Weight:      Height:        PHYSICAL EXAM General: Pleasant Caucasian male , well nourished, in no acute distress. Sitting in recliner with wife at bedside HEENT:  Normocephalic and atraumatic. Neck:  No JVD.  Lungs: Normal respiratory effort on room air. Clear bilaterally to auscultation. No wheezes, crackles, rhonchi.  Heart: irregular irregular with controlled rate  . Normal S1 and S2 without gallops or murmurs.  Abdomen: Non-distended appearing.  Msk: Normal strength and tone for age. Extremities: L foot with ace bandage and surgical shoe in place.  Neuro: Alert and oriented X 3. Psych:  Answers questions appropriately.   Labs: Basic Metabolic Panel: Recent Labs    08/19/22 0453 08/19/22 1123 08/21/22 0517  NA 133* 134*  --   K 4.0 3.9  --   CL 103 104  --   CO2 21* 23  --   GLUCOSE 254* 157*  --   BUN 19 18  --   CREATININE 1.50* 1.49* 1.36*  CALCIUM 8.0* 8.1*  --    Liver Function Tests: Recent Labs    08/19/22 1123  AST 126*  ALT 153*  ALKPHOS 127*  BILITOT 0.9  PROT 6.6  ALBUMIN 2.4*   No results for input(s): "LIPASE", "AMYLASE" in the last 72 hours. CBC: Recent Labs    08/20/22 0831 08/21/22 0900  WBC 14.2* 15.9*  HGB 8.8* 9.2*  HCT 26.7* 27.8*  MCV 93.4 92.7  PLT 282 331   Cardiac Enzymes: No results for input(s): "CKTOTAL", "CKMB", "CKMBINDEX", "TROPONINIHS" in the last 72 hours. BNP: No results  for input(s): "BNP" in the last 72 hours. D-Dimer: No results for input(s): "DDIMER" in the last 72 hours. Hemoglobin A1C: No results for input(s): "HGBA1C" in the last 72 hours. Fasting Lipid Panel: No results for input(s): "CHOL", "HDL", "LDLCALC", "TRIG", "CHOLHDL", "LDLDIRECT" in the last 72 hours. Thyroid Function Tests: No results for input(s): "TSH", "T4TOTAL", "T3FREE", "THYROIDAB" in the last 72 hours.  Invalid input(s): "FREET3" Anemia Panel: No results for input(s): "VITAMINB12", "FOLATE", "FERRITIN", "TIBC", "IRON", "RETICCTPCT" in the last 72 hours.   Radiology: DG Foot Complete Left  Result Date: 08/19/2022 CLINICAL DATA:  Postop. EXAM: LEFT FOOT - COMPLETE 3+ VIEW COMPARISON:  Preoperative exam earlier today FINDINGS: Interval transmetatarsal amputation of all 5 rays. Presumed antibiotic beads projecting over the mid metatarsals as well as the ankle at the level of the medial malleolus. Overlying skin staples in place. IMPRESSION: Interval transmetatarsal amputation of all 5 rays. Presumed antibiotic beads projecting over the mid metatarsals and medial ankle. Electronically Signed   By: Narda Rutherford M.D.   On: 08/19/2022 16:48   DG Foot 2 Views Left  Result Date: 08/19/2022 CLINICAL DATA:  Infection, patient reports recent surgery. EXAM: LEFT FOOT - 2 VIEW COMPARISON:  Foot MRI yesterday FINDINGS: Prior amputation of the second and third toes as well as fourth toe at the proximal aspect of the proximal phalanx. Focal defect involving the residual fourth toe proximal phalanx as seen on prior MRI. No definite radiographic correlate to the edematous changes in the second and third metatarsal heads on MRI. No evidence of acute fracture. Soft tissue edema is seen over the dorsum of the forefoot. Vascular calcifications. IMPRESSION: 1. Prior amputation of the second and third toes as well as fourth toe at the proximal phalanx. Small defect in the fourth toe proximal  phalanx  corresponding to MRI findings. No radiographic correlate to the marrow edema changes in the second and third metatarsal heads on MRI. 2. Forefoot soft tissue edema. Electronically Signed   By: Narda Rutherford M.D.   On: 08/19/2022 16:45   MR FOOT LEFT WO CONTRAST  Result Date: 08/18/2022 CLINICAL DATA:  Foot swelling, diabetic, osteomyelitis suspected, xray done EXAM: MRI OF THE LEFT FOOT WITHOUT CONTRAST TECHNIQUE: Multiplanar, multisequence MR imaging of the left forefoot was performed. No intravenous contrast was administered. COMPARISON:  None Available. FINDINGS: Bones/Joint/Cartilage Postsurgical changes of amputation of the second and third toes at the metatarsophalangeal joint level. Prior fourth toe amputation through the base of the proximal phalanx. Surgical defect or focal erosion involving the distal aspect of the residual fourth toe proximal phalanx (series 9, image 14). There is periosteal edema of the second and third metatarsal heads with relatively mild bone marrow edema. Prominent bone marrow edema within the first and third metatarsal diaphyses and at the proximal metaphysis of the second metatarsal. No well-defined fracture line. Normal alignment. Relatively mild degenerative changes. Ligaments Intact Lisfranc ligament. Remaining collateral ligaments are intact. Muscles and Tendons Chronic denervation changes of the foot musculature. Amputation changes of the musculotendinous structures of the second through fourth toes. No tenosynovitis. Soft tissues Soft tissue wound or ulceration at the dorsal aspect of the forefoot overlying the second metatarsal head. Additional shallow wounds or ulcerations dorsal to the third and fourth metatarsal heads. Extensive soft tissue edema. No organized or drainable fluid collections. IMPRESSION: 1. Postsurgical changes of amputation of the second through fourth toes. Soft tissue wounds or ulcerations at the dorsal aspect of the forefoot overlying the second  metatarsal head. Additional shallow wounds or ulcerations dorsal to the third and fourth metatarsal heads. 2. Small postsurgical defect or focal erosion involving the distal aspect of the residual fourth toe proximal phalanx. Appearance is suspicious for osteomyelitis. 3. Periosteal edema of the second and third metatarsal heads with relatively mild bone marrow edema. Findings are suspicious for early acute osteomyelitis. 4. Prominent bone marrow edema within the first and third metatarsal diaphyses and at the proximal metaphysis of the second metatarsal. No well-defined fracture line. Findings are favored to represent stress-related changes. 5. Extensive soft tissue edema. No organized or drainable fluid collections. Electronically Signed   By: Duanne Guess D.O.   On: 08/18/2022 20:24   CT Angio Chest PE W and/or Wo Contrast  Result Date: 08/17/2022 CLINICAL DATA:  Sepsis. EXAM: CT ANGIOGRAPHY CHEST WITH CONTRAST TECHNIQUE: Multidetector CT imaging of the chest was performed using the standard protocol during bolus administration of intravenous contrast. Multiplanar CT image reconstructions and MIPs were obtained to evaluate the vascular anatomy. RADIATION DOSE REDUCTION: This exam was performed according to the departmental dose-optimization program which includes automated exposure control, adjustment of the mA and/or kV according to patient size and/or use of iterative reconstruction technique. CONTRAST:  60mL OMNIPAQUE IOHEXOL 350 MG/ML SOLN COMPARISON:  Chest x-ray 08/17/2022 earlier FINDINGS: Cardiovascular: No pulmonary embolism identified. Status post median sternotomy. Heart is nonenlarged. No significant pericardial effusion. The thoracic aorta has a normal course and caliber with mild atherosclerotic calcified plaque. Coronary artery calcifications are noted. 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 Mediastinum/Nodes: Mildly patulous esophagus. No specific abnormal lymph node  enlargement seen in the axillary region, hilum or mediastinum. Lungs/Pleura: Mild diffuse breathing motion. No consolidation, pneumothorax or effusion. Dependent atelectasis. 3 mm nodule right upper lobe on series 5, image 77 is  noncalcified. Similar focus on image 56 but this has a component of calcification peripherally. Upper Abdomen: Gallstones in the nondilated gallbladder. There is dystrophic calcification along the left adrenal gland. Please correlate with any previous infectious, inflammatory or traumatic process. This was seen on the CT scan of 2014 of the abdomen and pelvis. Musculoskeletal: Mild degenerative changes along the spine. Review of the MIP images confirms the above findings. IMPRESSION: No pulmonary embolism identified.  Mild breathing motion. Postop chest. Gallstones. 3 mm lung nodules. No follow-up needed if patient is low-risk (and has no known or suspected primary neoplasm). Non-contrast chest CT can be considered in 12 months if patient is high-risk. This recommendation follows the consensus statement: Guidelines for Management of Incidental Pulmonary Nodules Detected on CT Images: From the Fleischner Society 2017; Radiology 2017; 284:228-243. Aortic Atherosclerosis (ICD10-I70.0). Electronically Signed   By: Karen Kays M.D.   On: 08/17/2022 13:43   CT HEAD WO CONTRAST ( )  Result Date: 08/17/2022 CLINICAL DATA:  Possible wound infection to left foot EXAM: CT HEAD WITHOUT CONTRAST TECHNIQUE: Contiguous axial images were obtained from the base of the skull through the vertex without intravenous contrast. RADIATION DOSE REDUCTION: This exam was performed according to the departmental dose-optimization program which includes automated exposure control, adjustment of the mA and/or kV according to patient size and/or use of iterative reconstruction technique. COMPARISON:  None Available. FINDINGS: Brain: No evidence of acute infarction, hemorrhage, mass, mass effect, or midline shift. No  hydrocephalus or extra-axial fluid collection. Vascular: No hyperdense vessel. Atherosclerotic calcifications in the intracranial carotid and vertebral arteries. Skull: Negative for fracture or focal lesion. Sinuses/Orbits: Mucosal thickening in the ethmoid air cells and left maxillary sinus. No acute finding in the orbits. Status post bilateral lens replacements. Other: The mastoid air cells are well aerated. IMPRESSION: No acute intracranial process. Electronically Signed   By: Wiliam Ke M.D.   On: 08/17/2022 13:35   DG Chest 2 View  Result Date: 08/17/2022 CLINICAL DATA:  Sepsis EXAM: CHEST - 2 VIEW COMPARISON:  CXR 07/19/12 FINDINGS: Status median sternotomy and CABG. No pleural effusion. No pneumothorax. Normal cardiac and mediastinal contours. No focal airspace opacity. No radiographically apparent displaced rib fractures. Vertebral body heights are visualized upper abdomen is unremarkable IMPRESSION: No focal airspace opacity Electronically Signed   By: Lorenza Cambridge M.D.   On: 08/17/2022 10:20   DG MINI C-ARM IMAGE ONLY  Result Date: 08/11/2022 There is no interpretation for this exam.  This order is for images obtained during a surgical procedure.  Please See "Surgeries" Tab for more information regarding the procedure.    ECHO 09/19/2012 INTERPRETATION ---------------------------------------------------------------   NORMAL LEFT VENTRICULAR SYSTOLIC FUNCTION   NORMAL RIGHT VENTRICULAR SYSTOLIC FUNCTION   VALVULAR REGURGITATION: TRIVIAL TR   NO VALVULAR STENOSIS   DEFINITY IMAGING AGENT USED   NO PRIOR FOR COMPARISON   (Report version 3.0)                    Interpreted and Electronically signed  Perform. by: Sarina Ill, RDCS               by: Chip Boer, MD  Resp.Person: Sarina Ill, RDCS               On: 09/19/2012 11:58:55  Resp. Staff: Hollice Espy, RN Exam End: 09/19/12 09:57    TELEMETRY reviewed by me (LT) 08/21/2022 : none available   EKG  reviewed by me: AF 93bpm  Data reviewed by me (LT) 08/21/2022: last PCP note, vascular note, hospitalist progress note, last 24h vitals tele labs imaging I/O    Principal Problem:   Sepsis Active Problems:   Type 2 diabetes mellitus with stage 3b chronic kidney disease, with long-term current use of insulin   Cellulitis   Hypotension due to hypovolemia   Infection of left foot   Atrial fibrillation    ASSESSMENT AND PLAN:  Andrew Harding. Andrew Harding is a 72yoM with a PMH of CAD s/p CABG x 4 (2014, [facility]), PAD, DM2, who presented to [facility] ED 08/17/2022 from his podiatrist office worsening swelling and erythema to his left foot.  He is now s/p Left TMA x 3 on 4/20 with podiatry with ongoing treatment for cellulitis and wound necrosis. Vascular surgery planning LE angiogram on 4/23. Cardiology is following for new onset paroxysmal AF discovered on admission EKG.   # sepsis 2/2 L foot cellulitis and wound necrosis  # s/p Left TMA x 3 with I&D to left foot and ankle on 4/20  -Agree with current therapy per primary team, podiatry, and vascular surgery -Vascular planning for LE angiogram on 4/23  # new onset paroxysmal AF  Discovered on initial EKG, apparently new in onset.  No telemetry to review today but rhythm is irregular on exam. Possibly provoked in the settin g -Continuous monitoring on telemetry while inpatient -Start metoprolol tartrate 12.5 mg twice daily for rate control -Okay with vascular surgery to start heparin infusion per pharmacy protocol for anticoagulation, likely discharge on a DOAC + single/dual antiplatelet therapy based on angiogram findings. CHADSVASC 4 (age, HTN, CAD/PAD, DM2) -TSH within normal limits -Echo complete  # CAD s/p CABG x 4 without chest pain Continue aspirin 81 mg daily, and pravastatin 80 daily -hold lisinopril 20 mg for now, likely restart at discharge  This patient's plan of care was discussed and created with Dr. Juliann Pares and he is in agreement.  [address] , PA-C 08/21/2022, 2:14 PM [facility] Cardiology

## 2022-08-21 NOTE — Progress Notes (Signed)
Occupational Therapy Treatment Patient Details Name: Andrew Harding MRN: [id number] DOB: [birth year] Today's Date: 08/21/2022   History of present illness Patient is a 72 year old male with recent toe amputations on left foot. Found to have worsening infection with gangrene and osteomyelitis. 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 History of diabetes mellitus   OT comments  Andrew Harding was seen for OT treatment on this date. Upon arrival to room, pt semi-supine in bed, agreeable to OT tx session. Pt states he is frustrated with "lack of progress" being made on his recovery process. Time taken to discuss typical progression of care after an amputation. OT also facilitated ADL management as described below. See ADL section for additional details. Pt requires MIN A for STS t/fs from EOB with max multimodal cueing to adhere to NWB precautions t/o session. Pt progressing toward goals and continues to benefit from skilled OT services to maximize return to PLOF and minimize risk of future falls, injury, caregiver burden, and readmission. Will continue to follow POC. Continue to anticipate the need for high intensity rehab services upon acute hospital DC.     Recommendations for follow up therapy are one component of a multi-disciplinary discharge planning process, led by the attending physician.  Recommendations may be updated based on patient status, additional functional criteria and insurance authorization.    Assistance Recommended at Discharge Intermittent Supervision/Assistance  Patient can return home with the following  A little help with walking and/or transfers;A little help with bathing/dressing/bathroom;Assistance with cooking/housework;Assist for transportation;Help with stairs or ramp for entrance   Equipment Recommendations   (defer)    Recommendations for Other Services      Precautions / Restrictions Precautions Precautions: Fall Restrictions Weight Bearing Restrictions: Yes LLE  Weight Bearing: Non weight bearing Other Position/Activity Restrictions: post-op shoe       Mobility Bed Mobility Overal bed mobility: Needs Assistance Bed Mobility: Supine to Sit, Sit to Supine     Supine to sit: Min guard, HOB elevated Sit to supine: Supervision, HOB elevated   General bed mobility comments: Increased time/effort to perform.    Transfers Overall transfer level: Needs assistance Equipment used: Rolling walker (2 wheels) Transfers: Sit to/from Stand Sit to Stand: Min assist, From elevated surface           General transfer comment: verbal cues for technique, hand placement, LLE positioning to maintain NWB of LLE with transfers. lifting and lowering assistance provided with STS. encouraged proper use of rolling walker positioning and use of upper body support to maintain NWB of LLE with transfer. further activity tolerance is limited by fatigue     Balance Overall balance assessment: Needs assistance Sitting-balance support: Feet supported Sitting balance-Leahy Scale: Good     Standing balance support: Bilateral upper extremity supported Standing balance-Leahy Scale: Poor Standing balance comment: external support required. heavy reliance on rolling walker in order to maintain true NWB of LLE                           ADL either performed or assessed with clinical judgement   ADL Overall ADL's : Needs assistance/impaired                                       General ADL Comments: Pt performs bed level LB dressing (dons post op shoe and regular shoe) with SET UP assist.  Pt educated on compensatory ADL management strategies including strategies to maintain NWB precautions through LLE. He requires MIN A for STS t/f from EOB with MAX Multimodal cueing for adherence to NWB precautions. MIN GUARD to hop sideways toward EOB.    Extremity/Trunk Assessment              Vision Patient Visual Report: No change from baseline      Perception     Praxis      Cognition Arousal/Alertness: Awake/alert Behavior During Therapy: WFL for tasks assessed/performed Overall Cognitive Status: Within Functional Limits for tasks assessed                                          Exercises Other Exercises Other Exercises: OT facilitated ADL management as described above with education on safety, falls prevention, and compensatory ADL management strategies t/o session.    Shoulder Instructions       General Comments      Pertinent Vitals/ Pain       Pain Assessment Pain Score: 4  Pain Location: L foot Pain Descriptors / Indicators: Aching Pain Intervention(s): Limited activity within patient's tolerance, Monitored during session  Home Living                                          Prior Functioning/Environment              Frequency  Min 3X/week        Progress Toward Goals  OT Goals(current goals can now be found in the care plan section)  Progress towards OT goals: Progressing toward goals  Acute Rehab OT Goals Patient Stated Goal: To get back to PLOF OT Goal Formulation: With patient Time For Goal Achievement: 09/03/22 Potential to Achieve Goals: Good  Plan Discharge plan remains appropriate;Frequency remains appropriate    Co-evaluation                 AM-PAC OT "6 Clicks" Daily Activity     Outcome Measure   Help from another person eating meals?: None Help from another person taking care of personal grooming?: A Little Help from another person toileting, which includes using toliet, bedpan, or urinal?: A Little Help from another person bathing (including washing, rinsing, drying)?: A Lot Help from another person to put on and taking off regular upper body clothing?: None Help from another person to put on and taking off regular lower body clothing?: A Little 6 Click Score: 19    End of Session Equipment Utilized During Treatment: Gait  belt;Rolling walker (2 wheels)  OT Visit Diagnosis: Other abnormalities of gait and mobility (R26.89)   Activity Tolerance Patient tolerated treatment well   Patient Left in bed;with call bell/phone within reach;with family/visitor present   Nurse Communication Mobility status        Time: 1610-9604 OT Time Calculation (min): 23 min  Charges: OT General Charges $OT Visit: 1 Visit OT Treatments $Self Care/Home Management : 23-37 mins  Rockney Ghee, M.S., OTR/L 08/21/22, 3:48 PM

## 2022-08-21 NOTE — Progress Notes (Signed)
*  PRELIMINARY RESULTS* Echocardiogram 2D Echocardiogram has been performed.  Andrew Harding 08/21/2022, 2:39 PM

## 2022-08-21 NOTE — Progress Notes (Signed)
ANTICOAGULATION CONSULT NOTE  Pharmacy Consult for heparin infusion Indication: atrial fibrillation  No Known Allergies  Patient Measurements: Height:  (190.5 cm) Weight: 113.4 kg (250 lb) IBW/kg (Calculated) : 84.5 Heparin Dosing Weight: 108 kg  Vital Signs: Temp: 98.6 F (37 C) (04/22 0816) BP: 132/64 (04/22 0816) Pulse Rate: 87 (04/22 0816)  Labs: Recent Labs    08/19/22 0453 08/19/22 1123 08/20/22 0831 08/21/22 0517 08/21/22 0900  HGB 9.1* 9.5* 8.8*  --  9.2*  HCT 27.5* 28.6* 26.7*  --  27.8*  PLT 269 276 282  --  331  CREATININE 1.50* 1.49*  --  1.36*  --     Estimated Creatinine Clearance: 65.8 mL/min (A) (by C-G formula based on SCr of 1.36 mg/dL (H)).   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 male w/ PMH of DM, periph neuropathy, HTN, HLD, CAD-CABG, CKD admitted for cellulitis now with new onset atrial fibrillation. He is on no chronic anticoagulation but did receive enoxaparin 57.5 mg 08/20/22 at 2150  Goal of Therapy:  Heparin level 0.3-0.7 units/ml Monitor platelets by anticoagulation  protocol: Yes   Plan:  ---Give 4000 units bolus x 1 (less than protocol due to recent enoxaparin administration) ---Start heparin infusion at 1700 units/hr ---Check anti-Xa level in 8 hours and daily while on heparin ---Continue to monitor H&H and platelets  Lowella Bandy 08/21/2022,10:02 AM

## 2022-08-21 NOTE — Progress Notes (Signed)
Progress Note    08/21/2022 9:24 AM 2 Days Post-Op  Subjective:  Andrew Harding is a 72 y.o. 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 Irene Limbo office after he went for wound check and noted upside was swollen and had erythema. Patient also had a syncopal episode couple days ago at home. He tells me he is not been his usual baseline and thinks he is not eating and drinking well.   Patient today is resting in bed comfortably.  Patient notes no pain today.  He also states that he is still feeling pretty good and better compared to when he came to the hospital.  Patient's dressings clean, dry, and intact.  Vascular surgery was consulted by Dr. Excell Seltzer to evaluate and assess blood flow to the patient's left foot for adequate healing.     Vitals:   08/21/22 0408 08/21/22 0816  BP: 128/60 132/64  Pulse: 76 87  Resp: 17 18  Temp: 98.7 F (37.1 C) 98.6 F (37 C)  SpO2: 99% 98%   Physical Exam: Cardiac:  AFIB No gallups, rubs or murmurs to note.  Lungs: Clear on auscultation throughout, no rhonchi wheezing or rales to note Incisions: Left foot with bandage intact.  Not taken down this morning.  No drainage noted. Extremities: Right lower extremity with palpable pulses and warm to touch.  Left lower extremity is warm to touch with palpable pulses now status post transmetatarsal amputation with dressing intact. Abdomen: Positive bowel sounds throughout, soft, nontender, nondistended Neurologic: Alert and oriented x 4, follows commands appropriately, answers all questions appropriately.  CBC    Component Value Date/Time   WBC 15.9 (H) 08/21/2022 0900   RBC 3.00 (L) 08/21/2022 0900   HGB 9.2 (L) 08/21/2022 0900   HCT 27.8 (L) 08/21/2022 0900   PLT 331 08/21/2022 0900   MCV 92.7 08/21/2022 0900   MCH 30.7 08/21/2022 0900   MCHC 33.1  08/21/2022 0900   RDW 12.8 08/21/2022 0900   LYMPHSABS 0.7 08/17/2022 0944   MONOABS 0.7 08/17/2022 0944   EOSABS 0.0 08/17/2022 0944   BASOSABS 0.0 08/17/2022 0944    BMET    Component Value Date/Time   NA 134 (L) 08/19/2022 1123   K 3.9 08/19/2022 1123   CL 104 08/19/2022 1123   CO2 23 08/19/2022 1123   GLUCOSE 157 (H) 08/19/2022 1123   GLUCOSE 402 (H) 09/17/2012 1417   BUN 18 08/19/2022 1123   CREATININE 1.36 (H) 08/21/2022 0517   CALCIUM 8.1 (L) 08/19/2022 1123   GFRNONAA 55 (L) 08/21/2022 0517   GFRAA 58 (L) 06/02/2015 0656    INR    Component Value Date/Time   INR 1.2 08/17/2022 0944     Intake/Output Summary (Last 24 hours) at 08/21/2022 0924 Last data filed at 08/21/2022 0800 Gross per 24 hour  Intake 1189.54 ml  Output 850 ml  Net 339.54 ml     Assessment/Plan:  72 y.o. male is s/p left transmetatarsal amputation 2 Days Post-Op   PLAN: Vascular surgery will take the patient to the vascular lab tomorrow for a left lower extremity angiogram to evaluate and assess appropriate blood flow for healing purposes status post left foot transmetatarsal amputation.  I had a long detailed discussion with both the patient and his wife at the bedside this morning.  I discussed in detail the procedure, benefits, complications, and risks.  Both verbalized her  understanding.  I answered both of their questions this morning.  Both would like to proceed as soon as possible.  Patient will be made n.p.o. after midnight for procedure tomorrow Tuesday, 08/22/2002.  I discussed the plan in detail with Dr. Levora Dredge MD and he is in agreement with the plan  DVT prophylaxis: Heparin infusion   Marcie Bal [facility] 08/21/2022 9:24 AM

## 2022-08-21 NOTE — Progress Notes (Signed)
ANTICOAGULATION CONSULT NOTE  Pharmacy Consult for heparin infusion Indication: atrial fibrillation  No Known Allergies  Patient Measurements: Height:  (190.5 cm) Weight: 113.4 kg (250 lb) IBW/kg (Calculated) : 84.5 Heparin Dosing Weight: 108 kg  Vital Signs: Temp: 99.2 F (37.3 C) (04/22 1904) Temp Source: Oral (04/22 1904) BP: 139/85 (04/22 2236) Pulse Rate: 83 (04/22 2236)  Labs: Recent Labs    08/19/22 0453 08/19/22 1123 08/20/22 0831 08/21/22 0517 08/21/22 0900 08/21/22 2244  HGB 9.1* 9.5* 8.8*  --  9.2*  --   HCT 27.5* 28.6* 26.7*  --  27.8*  --   PLT 269 276 282  --  331  --   HEPARINUNFRC  --   --   --   --   --  0.23*  CREATININE 1.50* 1.49*  --  1.36*  --   --      Estimated Creatinine Clearance: 65.8 mL/min (A) (by C-G formula based on SCr of 1.36 mg/dL (H)).   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 male w/ PMH of DM, periph neuropathy, HTN, HLD, CAD-CABG, CKD admitted for cellulitis now with new onset atrial fibrillation.  He is on no chronic anticoagulation but did receive enoxaparin 57.5 mg 08/20/22 at 2150  Goal of Therapy:  Heparin level 0.3-0.7 units/ml Monitor platelets by anticoagulation protocol: Yes   Plan:  4/22:  HL @ 2244 = 0.23, SUBtherapeutic  - Will order heparin 1600 units IV X 1 bolus and increase drip rate to 1900 units/hr. - Will recheck HL 8 hrs after rate change ---Continue to monitor H&H and platelets  Debany Vantol D 08/21/2022,11:09 PM

## 2022-08-21 NOTE — Inpatient Diabetes Management (Signed)
Inpatient Diabetes Program Recommendations  AACE/ADA: New Consensus Statement on Inpatient Glycemic Control (2015)  Target Ranges:  Prepandial:   less than 140 mg/dL      Peak postprandial:   less than 180 mg/dL (1-2 hours)      Critically ill patients:  140 - 180 mg/dL    Latest Reference Range & Units 08/20/22 07:46 08/20/22 11:30 08/20/22 16:41 08/20/22 18:07 08/20/22 21:57  Glucose-Capillary 70 - 99 mg/dL 90  4 units Novolog  161 (H)  4 units Novolog  62 (L) 101 (H) 97    50 units Semglee  (H): Data is abnormally high (L): Data is abnormally low  Latest Reference Range & Units 08/21/22 08:15  Glucose-Capillary 70 - 99 mg/dL 63 (L)  (L): Data is abnormally low     Home DM Meds: Lantus 52 units QHS        Novolog 15-20 units TID with meals        Metformin 500 mg BID        Trulicity 4.5 mg Qweek   Current Orders: Semglee 50 units QHS     Novolog Sensitive Correction Scale/ SSI (0-9 units) TID AC + HS    Note Hypoglycemia yesterday afternoon after receiving 4 units Novolog Meal Coverage Novolog Meal Coverage has been d/c'd  MD- Note Hypoglycemia again this AM  Please consider reducing the Semglee Insulin to 40 units QHS     --Will follow patient during hospitalization--  Ambrose Finland RN, MSN, CDCES Diabetes Coordinator Inpatient Glycemic Control Team Team Pager: [phone] (8a-5p)

## 2022-08-21 NOTE — H&P (View-Only) (Signed)
Progress Note    08/21/2022 9:24 AM 2 Days Post-Op  Subjective:  Andrew Harding is a 72 y.o. 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 Irene Harding office after he went for wound check and noted upside was swollen and had erythema. Patient also had a syncopal episode couple days ago at home. He tells me he is not been his usual baseline and thinks he is not eating and drinking well.   Patient today is resting in bed comfortably.  Patient notes no pain today.  He also states that he is still feeling pretty good and better compared to when he came to the hospital.  Patient's dressings clean, dry, and intact.  Vascular surgery was consulted by Dr. Excell Harding to evaluate and assess blood flow to the patient's left foot for adequate healing.     Vitals:   08/21/22 0408 08/21/22 0816  BP: 128/60 132/64  Pulse: 76 87  Resp: 17 18  Temp: 98.7 F (37.1 C) 98.6 F (37 C)  SpO2: 99% 98%   Physical Exam: Cardiac:  AFIB No gallups, rubs or murmurs to note.  Lungs: Clear on auscultation throughout, no rhonchi wheezing or rales to note Incisions: Left foot with bandage intact.  Not taken down this morning.  No drainage noted. Extremities: Right lower extremity with palpable pulses and warm to touch.  Left lower extremity is warm to touch with palpable pulses now status post transmetatarsal amputation with dressing intact. Abdomen: Positive bowel sounds throughout, soft, nontender, nondistended Neurologic: Alert and oriented x 4, follows commands appropriately, answers all questions appropriately.  CBC    Component Value Date/Time   WBC 15.9 (H) 08/21/2022 0900   RBC 3.00 (L) 08/21/2022 0900   HGB 9.2 (L) 08/21/2022 0900   HCT 27.8 (L) 08/21/2022 0900   PLT 331 08/21/2022 0900   MCV 92.7 08/21/2022 0900   MCH 30.7 08/21/2022 0900   MCHC 33.1  08/21/2022 0900   RDW 12.8 08/21/2022 0900   LYMPHSABS 0.7 08/17/2022 0944   MONOABS 0.7 08/17/2022 0944   EOSABS 0.0 08/17/2022 0944   BASOSABS 0.0 08/17/2022 0944    BMET    Component Value Date/Time   NA 134 (L) 08/19/2022 1123   K 3.9 08/19/2022 1123   CL 104 08/19/2022 1123   CO2 23 08/19/2022 1123   GLUCOSE 157 (H) 08/19/2022 1123   GLUCOSE 402 (H) 09/17/2012 1417   BUN 18 08/19/2022 1123   CREATININE 1.36 (H) 08/21/2022 0517   CALCIUM 8.1 (L) 08/19/2022 1123   GFRNONAA 55 (L) 08/21/2022 0517   GFRAA 58 (L) 06/02/2015 0656    INR    Component Value Date/Time   INR 1.2 08/17/2022 0944     Intake/Output Summary (Last 24 hours) at 08/21/2022 0924 Last data filed at 08/21/2022 0800 Gross per 24 hour  Intake 1189.54 ml  Output 850 ml  Net 339.54 ml     Assessment/Plan:  72 y.o. male is s/p left transmetatarsal amputation 2 Days Post-Op   PLAN: Vascular surgery will take the patient to the vascular lab tomorrow for a left lower extremity angiogram to evaluate and assess appropriate blood flow for healing purposes status post left foot transmetatarsal amputation.  I had a long detailed discussion with both the patient and his wife at the bedside this morning.  I discussed in detail the procedure, benefits, complications, and risks.  Both verbalized her  understanding.  I answered both of their questions this morning.  Both would like to proceed as soon as possible.  Patient will be made n.p.o. after midnight for procedure tomorrow Tuesday, 08/22/2002.  I discussed the plan in detail with Dr. Gregory Schnier MD and he is in agreement with the plan  DVT prophylaxis: Heparin infusion   Andrew Harding [facility] 08/21/2022 9:24 AM   

## 2022-08-21 NOTE — Progress Notes (Signed)
Physical Therapy Treatment Patient Details Name: Andrew Harding MRN: [id number] DOB: 1950-08-04 Today's Date: 08/21/2022   History of Present Illness Patient is a 72 year old male with recent toe amputations on left foot. Found to have worsening infection with gangrene and osteomyelitis. 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 History of diabetes mellitus    PT Comments    Patient sitting up in chair on arrival to room with family member present. He reports a fall while in the bathroom last night without injury. Reinforced importance for waiting for staff assistance with mobility for fall prevention. Transfer training continued today with emphasis on techniques to increase independence and to maintain NWB of LLE with mobility. Activity tolerance limited by general fatigue today. LE exercises performed with assistance in bed for strengthening. Recommend to continue PT to maximize independence and decrease caregiver burden.    Recommendations for follow up therapy are one component of a multi-disciplinary discharge planning process, led by the attending physician.  Recommendations may be updated based on patient status, additional functional criteria and insurance authorization.  Follow Up Recommendations       Assistance Recommended at Discharge Intermittent Supervision/Assistance  Patient can return home with the following A little help with walking and/or transfers;A little help with bathing/dressing/bathroom;Assistance with cooking/housework;Assist for transportation;Help with stairs or ramp for entrance   Equipment Recommendations   (to be determined)    Recommendations for Other Services       Precautions / Restrictions Precautions Precautions: Fall Restrictions Weight Bearing Restrictions: Yes LLE Weight Bearing: Non weight bearing Other Position/Activity Restrictions: post-op shoe     Mobility  Bed Mobility Overal bed mobility: Needs Assistance Bed Mobility: Sit to  Supine       Sit to supine: Supervision   General bed mobility comments: HOB flat and no use of bed rails. increased time required    Transfers Overall transfer level: Needs assistance Equipment used: Rolling walker (2 wheels) Transfers: Bed to chair/wheelchair/BSC     Step pivot transfers: Mod assist       General transfer comment: verbal cues for technique, hand placement, LLE positioning to maintain NWB of LLE with transfers. lifting and lowering assistance provided with pivot transfer. encouraged proper use of rolling walker positioning and use of upper body support to maintain NWB of LLE with transfer. further activity tolerance is limited by fatigue    Ambulation/Gait                   Stairs             Wheelchair Mobility    Modified Rankin (Stroke Patients Only)       Balance Overall balance assessment: Needs assistance Sitting-balance support: Feet supported Sitting balance-Leahy Scale: Good     Standing balance support: Bilateral upper extremity supported Standing balance-Leahy Scale: Poor Standing balance comment: external support required. heavy reliance on rolling walker in order to maintain true NWB of LLE                            Cognition Arousal/Alertness: Awake/alert Behavior During Therapy: WFL for tasks assessed/performed Overall Cognitive Status: Within Functional Limits for tasks assessed                                          Exercises General Exercises - Lower Extremity Ankle Circles/Pumps:  AROM, Strengthening, Right, 5 reps, Supine Heel Slides: AAROM, Strengthening, Both, 10 reps, Supine Hip ABduction/ADduction: AAROM, Strengthening, Both, 10 reps, Supine Straight Leg Raises: AAROM, Strengthening, Both, 10 reps, Supine Other Exercises Other Exercises: verbal cues for exercise technique for strengthening. encouraged active movement of UE and LE for strengthening    General Comments  General comments (skin integrity, edema, etc.): patient educated patient not to get up without staff assistance for fall prevention. urinal placed within reach and bed alarm on for safety      Pertinent Vitals/Pain Pain Assessment Pain Assessment: No/denies pain    Home Living                          Prior Function            PT Goals (current goals can now be found in the care plan section) Acute Rehab PT Goals Patient Stated Goal: to go home PT Goal Formulation: With patient Time For Goal Achievement: 09/03/22 Potential to Achieve Goals: Good Progress towards PT goals: Progressing toward goals    Frequency    Min 4X/week      PT Plan Current plan remains appropriate    Co-evaluation              AM-PAC PT "6 Clicks" Mobility   Outcome Measure  Help needed turning from your back to your side while in a flat bed without using bedrails?: None Help needed moving from lying on your back to sitting on the side of a flat bed without using bedrails?: A Little Help needed moving to and from a bed to a chair (including a wheelchair)?: A Little Help needed standing up from a chair using your arms (e.g., wheelchair or bedside chair)?: A Lot Help needed to walk in hospital room?: A Lot Help needed climbing 3-5 steps with a railing? : Total 6 Click Score: 15    End of Session   Activity Tolerance: Patient limited by fatigue Patient left: in bed;with call bell/phone within reach;with bed alarm set;with family/visitor present Nurse Communication: Mobility status PT Visit Diagnosis: Muscle weakness (generalized) (M62.81);Difficulty in walking, not elsewhere classified (R26.2)     Time: 1610-9604 PT Time Calculation (min) (ACUTE ONLY): 20 min  Charges:  $Therapeutic Activity: 8-22 mins                     Donna Bernard, PT, MPT    Ina Homes 08/21/2022, 10:51 AM

## 2022-08-21 NOTE — Progress Notes (Signed)
Inpatient Rehab Admissions Coordinator:  ° °Per therapy recommendation,  patient was screened for CIR candidacy by Lindzie Boxx, MS, CCC-SLP. At this time, Pt. Appears to be a a potential candidate for CIR. I will place   order for rehab consult per protocol for full assessment. Please contact me any with questions. ° °Yerick Eggebrecht, MS, CCC-SLP °Rehab Admissions Coordinator  °[phone] (celll) °[phone] (office) ° °

## 2022-08-22 ENCOUNTER — Encounter
Admission: EM | Disposition: A | Discharge status: Rehabilitation Facility | Payer: Self-pay | Source: Home / Self Care | Attending: Internal Medicine

## 2022-08-22 DIAGNOSIS — Z89432 Acquired absence of left foot: Secondary | ICD-10-CM | POA: Diagnosis not present

## 2022-08-22 DIAGNOSIS — I70249 Atherosclerosis of native arteries of left leg with ulceration of unspecified site: Secondary | ICD-10-CM | POA: Diagnosis not present

## 2022-08-22 DIAGNOSIS — I700 Atherosclerosis of aorta: Secondary | ICD-10-CM

## 2022-08-22 DIAGNOSIS — L97929 Non-pressure chronic ulcer of unspecified part of left lower leg with unspecified severity: Secondary | ICD-10-CM

## 2022-08-22 DIAGNOSIS — A419 Sepsis, unspecified organism: Secondary | ICD-10-CM | POA: Diagnosis not present

## 2022-08-22 DIAGNOSIS — E1122 Type 2 diabetes mellitus with diabetic chronic kidney disease: Secondary | ICD-10-CM | POA: Diagnosis not present

## 2022-08-22 DIAGNOSIS — I4891 Unspecified atrial fibrillation: Secondary | ICD-10-CM | POA: Diagnosis not present

## 2022-08-22 DIAGNOSIS — L089 Local infection of the skin and subcutaneous tissue, unspecified: Secondary | ICD-10-CM | POA: Diagnosis not present

## 2022-08-22 HISTORY — PX: LOWER EXTREMITY ANGIOGRAPHY: CATH118251

## 2022-08-22 LAB — CBC
HCT: 25.9 % — ABNORMAL LOW (ref 39.0–52.0)
Hemoglobin: 8.5 g/dL — ABNORMAL LOW (ref 13.0–17.0)
MCH: 30.5 pg (ref 26.0–34.0)
MCHC: 32.8 g/dL (ref 30.0–36.0)
MCV: 92.8 fL (ref 80.0–100.0)
Platelets: 302 10*3/uL (ref 150–400)
RBC: 2.79 MIL/uL — ABNORMAL LOW (ref 4.22–5.81)
RDW: 12.7 % (ref 11.5–15.5)
WBC: 10.6 10*3/uL — ABNORMAL HIGH (ref 4.0–10.5)
nRBC: 0 % (ref 0.0–0.2)

## 2022-08-22 LAB — HEPATIC FUNCTION PANEL
ALT: 119 U/L — ABNORMAL HIGH (ref 0–44)
AST: 157 U/L — ABNORMAL HIGH (ref 15–41)
Albumin: 2.2 g/dL — ABNORMAL LOW (ref 3.5–5.0)
Alkaline Phosphatase: 162 U/L — ABNORMAL HIGH (ref 38–126)
Bilirubin, Direct: 0.2 mg/dL (ref 0.0–0.2)
Indirect Bilirubin: 0.4 mg/dL (ref 0.3–0.9)
Total Bilirubin: 0.6 mg/dL (ref 0.3–1.2)
Total Protein: 6.3 g/dL — ABNORMAL LOW (ref 6.5–8.1)

## 2022-08-22 LAB — BASIC METABOLIC PANEL
Anion gap: 8 (ref 5–15)
BUN: 20 mg/dL (ref 8–23)
CO2: 23 mmol/L (ref 22–32)
Calcium: 8.2 mg/dL — ABNORMAL LOW (ref 8.9–10.3)
Chloride: 104 mmol/L (ref 98–111)
Creatinine, Ser: 1.26 mg/dL — ABNORMAL HIGH (ref 0.61–1.24)
GFR, Estimated: >60 mL/min (ref >60)
Glucose, Bld: 113 mg/dL — ABNORMAL HIGH (ref 70–99)
Potassium: 3.9 mmol/L (ref 3.5–5.1)
Sodium: 135 mmol/L (ref 135–145)

## 2022-08-22 LAB — CULTURE, BLOOD (ROUTINE X 2)

## 2022-08-22 LAB — GLUCOSE, CAPILLARY
Glucose-Capillary: 109 mg/dL — ABNORMAL HIGH (ref 70–99)
Glucose-Capillary: 107 mg/dL — ABNORMAL HIGH (ref 70–99)
Glucose-Capillary: 105 mg/dL — ABNORMAL HIGH (ref 70–99)
Glucose-Capillary: 186 mg/dL — ABNORMAL HIGH (ref 70–99)

## 2022-08-22 LAB — HEPARIN LEVEL (UNFRACTIONATED): Heparin Unfractionated: 0.37 IU/mL (ref 0.30–0.70)

## 2022-08-22 SURGERY — LOWER EXTREMITY ANGIOGRAPHY
Anesthesia: Moderate Sedation | Laterality: Left

## 2022-08-22 MED ORDER — HEPARIN SODIUM (PORCINE) 1000 UNIT/ML IJ SOLN
INTRAMUSCULAR | Status: AC
  Filled 2022-08-22: qty 10

## 2022-08-22 MED ORDER — HEPARIN (PORCINE) 25000 UT/250ML-% IV SOLN
2100.0000 [IU]/h | INTRAVENOUS | Status: DC
  Administered 2022-08-22: 1900 [IU]/h via INTRAVENOUS
  Administered 2022-08-23: 2100 [IU]/h via INTRAVENOUS
  Filled 2022-08-22: qty 250

## 2022-08-22 MED ORDER — OXYCODONE HCL 5 MG PO TABS
5.0000 mg | ORAL_TABLET | ORAL | Status: DC | PRN
  Administered 2022-08-22: 10 mg via ORAL
  Filled 2022-08-22: qty 2

## 2022-08-22 MED ORDER — METHYLPREDNISOLONE SODIUM SUCC 125 MG IJ SOLR: 125.0000 mg | Freq: Once | INTRAMUSCULAR | Status: DC | PRN

## 2022-08-22 MED ORDER — CHLORHEXIDINE GLUCONATE 4 % EX SOLN
60.0000 mL | Freq: Once | CUTANEOUS | Status: AC
  Administered 2022-08-23: 4 via TOPICAL

## 2022-08-22 MED ORDER — ONDANSETRON HCL 4 MG/2ML IJ SOLN: 4.0000 mg | Freq: Four times a day (QID) | INTRAMUSCULAR | Status: DC | PRN

## 2022-08-22 MED ORDER — DIPHENHYDRAMINE HCL 50 MG/ML IJ SOLN: 50.0000 mg | Freq: Once | INTRAMUSCULAR | Status: DC | PRN

## 2022-08-22 MED ORDER — MIDAZOLAM HCL 2 MG/2ML IJ SOLN
INTRAMUSCULAR | Status: DC | PRN
  Administered 2022-08-22: 2 mg via INTRAVENOUS

## 2022-08-22 MED ORDER — HEPARIN SODIUM (PORCINE) 1000 UNIT/ML IJ SOLN
INTRAMUSCULAR | Status: DC | PRN
  Administered 2022-08-22: 6000 [IU] via INTRAVENOUS

## 2022-08-22 MED ORDER — HYDROMORPHONE HCL 1 MG/ML IJ SOLN: 1.0000 mg | Freq: Once | INTRAMUSCULAR | Status: DC | PRN

## 2022-08-22 MED ORDER — SODIUM CHLORIDE 0.9% FLUSH: 3.0000 mL | INTRAVENOUS | Status: DC | PRN

## 2022-08-22 MED ORDER — SODIUM CHLORIDE 0.9% FLUSH
3.0000 mL | Freq: Two times a day (BID) | INTRAVENOUS | Status: DC
  Administered 2022-08-22 – 2022-08-29 (×11): 3 mL via INTRAVENOUS

## 2022-08-22 MED ORDER — MIDAZOLAM HCL 5 MG/5ML IJ SOLN
INTRAMUSCULAR | Status: AC
  Filled 2022-08-22: qty 5

## 2022-08-22 MED ORDER — MIDAZOLAM HCL 2 MG/ML PO SYRP: 8.0000 mg | ORAL_SOLUTION | Freq: Once | ORAL | Status: DC | PRN

## 2022-08-22 MED ORDER — FENTANYL CITRATE (PF) 100 MCG/2ML IJ SOLN
INTRAMUSCULAR | Status: AC
  Filled 2022-08-22: qty 2

## 2022-08-22 MED ORDER — FENTANYL CITRATE PF 50 MCG/ML IJ SOSY: 12.5000 ug | PREFILLED_SYRINGE | Freq: Once | INTRAMUSCULAR | Status: DC | PRN

## 2022-08-22 MED ORDER — FENTANYL CITRATE (PF) 100 MCG/2ML IJ SOLN
INTRAMUSCULAR | Status: DC | PRN
  Administered 2022-08-22: 50 ug via INTRAVENOUS

## 2022-08-22 MED ORDER — CLOPIDOGREL BISULFATE 75 MG PO TABS
75.0000 mg | ORAL_TABLET | Freq: Every day | ORAL | Status: DC
  Administered 2022-08-24 – 2022-08-29 (×6): 75 mg via ORAL
  Filled 2022-08-22 (×6): qty 1

## 2022-08-22 MED ORDER — CEFAZOLIN SODIUM-DEXTROSE 2-4 GM/100ML-% IV SOLN: 2.0000 g | INTRAVENOUS | Status: DC

## 2022-08-22 MED ORDER — POVIDONE-IODINE 10 % EX SWAB
2.0000 | Freq: Once | CUTANEOUS | Status: AC
  Administered 2022-08-23: 2 via TOPICAL

## 2022-08-22 MED ORDER — FAMOTIDINE 20 MG PO TABS: 40.0000 mg | ORAL_TABLET | Freq: Once | ORAL | Status: DC | PRN

## 2022-08-22 MED ORDER — ACETAMINOPHEN 325 MG PO TABS: 650.0000 mg | ORAL_TABLET | ORAL | Status: DC | PRN

## 2022-08-22 MED ORDER — SODIUM CHLORIDE 0.9 % IV SOLN: INTRAVENOUS | Status: AC

## 2022-08-22 MED ORDER — SODIUM CHLORIDE 0.9 % IV SOLN: 250.0000 mL | INTRAVENOUS | Status: DC | PRN

## 2022-08-22 MED ORDER — SODIUM CHLORIDE 0.9 % IV SOLN
1.0000 g | Freq: Three times a day (TID) | INTRAVENOUS | Status: DC
  Administered 2022-08-23 – 2022-08-29 (×19): 1 g via INTRAVENOUS
  Filled 2022-08-22: qty 20
  Filled 2022-08-22 (×4): qty 1
  Filled 2022-08-22 (×2): qty 20
  Filled 2022-08-22: qty 1
  Filled 2022-08-22 (×3): qty 20
  Filled 2022-08-22: qty 1
  Filled 2022-08-22: qty 20
  Filled 2022-08-22: qty 1
  Filled 2022-08-22 (×2): qty 20
  Filled 2022-08-22: qty 1
  Filled 2022-08-22 (×3): qty 20
  Filled 2022-08-22: qty 1
  Filled 2022-08-22 (×3): qty 20

## 2022-08-22 MED ORDER — MORPHINE SULFATE (PF) 4 MG/ML IV SOLN
2.0000 mg | INTRAVENOUS | Status: DC | PRN
  Administered 2022-08-23: 2 mg via INTRAVENOUS
  Filled 2022-08-22: qty 1

## 2022-08-22 MED ORDER — CLOPIDOGREL BISULFATE 75 MG PO TABS
150.0000 mg | ORAL_TABLET | ORAL | Status: AC
  Administered 2022-08-22: 150 mg via ORAL
  Filled 2022-08-22: qty 2

## 2022-08-22 SURGICAL SUPPLY — 27 items
ADPR CATH 9FR SLF ADJ SL SD (SHEATH) ×1
BALLN ULTRASCORE 014 2X100X150 (BALLOONS) ×1
BALLN ULTRASCORE 014 3X40X150 (BALLOONS) ×1
BALLN ULTRSCOR 014 2.5X200X150 (BALLOONS) ×1
BALLOON ULTRSC 014 2.5X200X150 (BALLOONS) IMPLANT
BALLOON ULTRSCRE 014 2X100X150 (BALLOONS) IMPLANT
BALLOON ULTRSCRE 014 3X40X150 (BALLOONS) IMPLANT
CATH ANGIO 5F PIGTAIL 65CM (CATHETERS) IMPLANT
CATH SEEKER .035X150CM (CATHETERS) IMPLANT
CATH VERT 5FR 125CM (CATHETERS) IMPLANT
COVER PROBE ULTRASOUND 5X96 (MISCELLANEOUS) IMPLANT
DEVICE STARCLOSE SE CLOSURE (Vascular Products) IMPLANT
GLIDEWIRE ADV .035X260CM (WIRE) IMPLANT
GOWN STRL REUS W/ TWL LRG LVL3 (GOWN DISPOSABLE) ×1 IMPLANT
GOWN STRL REUS W/TWL LRG LVL3 (GOWN DISPOSABLE) ×1
KIT ENCORE 26 ADVANTAGE (KITS) IMPLANT
NDL ENTRY 21GA 7CM ECHOTIP (NEEDLE) IMPLANT
NEEDLE ENTRY 21GA 7CM ECHOTIP (NEEDLE) ×1 IMPLANT
PACK ANGIOGRAPHY (CUSTOM PROCEDURE TRAY) ×1 IMPLANT
SET INTRO CAPELLA COAXIAL (SET/KITS/TRAYS/PACK) IMPLANT
SHEATH BRITE TIP 5FRX11 (SHEATH) IMPLANT
SHEATH SHUTTLE SELECT 6F (SHEATH) IMPLANT
SYR MEDRAD MARK 7 150ML (SYRINGE) IMPLANT
TUBING CONTRAST HIGH PRESS 72 (TUBING) IMPLANT
VALVE CHECKFLO PERFORMER (SHEATH) IMPLANT
WIRE GUIDERIGHT .035X150 (WIRE) IMPLANT
WIRE RUNTHROUGH .014X300CM (WIRE) IMPLANT
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
## 2022-08-22 NOTE — Progress Notes (Signed)
Physical Therapy Treatment Patient Details Name: Andrew Harding MRN: [id number] DOB: [birth year] Today's Date: 08/22/2022   History of Present Illness Andrew Harding is a 72 year old male with recent toe amputations on left foot. Found to have worsening infection with gangrene and osteomyelitis. 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 History of diabetes mellitus.    PT Comments    Pt in bed on entry, wife visiting. Pt has been NPO today, awaiting vascular imaging study in afternoon, but he is agreeable to a limited PT session, whatever minimal activity is tolerated. Session focus on higher repetition of simple mobility tasks, requiring less exertion so he can practice and master true NWB LLE. He generally does well demonstrating good strength potential and trunk control seated. In standing his balance is less controlled and stamina is limited, but he is successful in NWB. We discussed ergonomic differences between hopping and walking, educated on indication for a different mobility strategy for longer distances- maybe AC or knee scooter. Will continue to progress as able.   Recommendations for follow up therapy are one component of a multi-disciplinary discharge planning process, led by the attending physician.  Recommendations may be updated based on patient status, additional functional criteria and insurance authorization.  Follow Up Recommendations       Assistance Recommended at Discharge Intermittent Supervision/Assistance  Patient can return home with the following A little help with walking and/or transfers;A little help with bathing/dressing/bathroom;Assistance with cooking/housework;Assist for transportation;Help with stairs or ramp for entrance   Equipment Recommendations       Recommendations for Other Services       Precautions / Restrictions Restrictions LLE Weight Bearing: Non weight bearing Other Position/Activity Restrictions: post-op shoe     Mobility  Bed  Mobility Overal bed mobility: Modified Independent Bed Mobility: Supine to Sit     Supine to sit: Modified independent (Device/Increase time)          Transfers Overall transfer level: Needs assistance Equipment used: Rolling walker (2 wheels) Transfers: Sit to/from Stand Sit to Stand: From elevated surface, Supervision (maintains NWB as directed)          Lateral/Scoot Transfers: Supervision, From elevated surface (can sccot entired length of bed each way with NWB, EOB elevated.)      Ambulation/Gait Ambulation/Gait assistance: Min guard Gait Distance (Feet): 5 Feet Assistive device: Rolling walker (2 wheels)         General Gait Details: fwd and backward hop-step at bedside;, maintained NWB but needs minGuard due to precarious balance.   Stairs             Wheelchair Mobility    Modified Rankin (Stroke Patients Only)       Balance                                            Cognition Arousal/Alertness: Awake/alert Behavior During Therapy: WFL for tasks assessed/performed Overall Cognitive Status: Within Functional Limits for tasks assessed                                          Exercises Other Exercises Other Exercises: lateral scooting transfer practice of LLE NWB 1x bed length bilat (EOB elevated) Other Exercises: STS from elevated EOB x5, RW, LLE NWB as directed, max effort  required. Other Exercises: 2x61ft hop step fwd/backward near bed, seated recovery interval between these.    General Comments        Pertinent Vitals/Pain Pain Assessment Pain Assessment: No/denies pain    Home Living                          Prior Function            PT Goals (current goals can now be found in the care plan section) Acute Rehab PT Goals Patient Stated Goal: to go home PT Goal Formulation: With patient Time For Goal Achievement: 09/03/22 Potential to Achieve Goals: Poor Progress towards PT  goals: Progressing toward goals    Frequency    Min 4X/week      PT Plan Current plan remains appropriate    Co-evaluation              AM-PAC PT "6 Clicks" Mobility   Outcome Measure  Help needed turning from your back to your side while in a flat bed without using bedrails?: None Help needed moving from lying on your back to sitting on the side of a flat bed without using bedrails?: None Help needed moving to and from a bed to a chair (including a wheelchair)?: A Lot Help needed standing up from a chair using your arms (e.g., wheelchair or bedside chair)?: A Lot Help needed to walk in hospital room?: A Lot Help needed climbing 3-5 steps with a railing? : Total 6 Click Score: 15    End of Session   Activity Tolerance: Patient limited by fatigue;Patient tolerated treatment well Patient left: in bed;with call bell/phone within reach;with family/visitor present Nurse Communication: Mobility status PT Visit Diagnosis: Muscle weakness (generalized) (M62.81);Difficulty in walking, not elsewhere classified (R26.2)     Time: 7829-5621 PT Time Calculation (min) (ACUTE ONLY): 18 min  Charges:  $Therapeutic Activity: 8-22 mins                 1:06 PM, 08/22/22 Rosamaria Lints, PT, DPT Physical Therapist - [facility]  [phone] (ASCOM)    Pearlene Teat C 08/22/2022, 1:03 PM
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
## 2022-08-22 NOTE — Progress Notes (Signed)
ANTICOAGULATION CONSULT NOTE  Pharmacy Consult for heparin infusion Indication: atrial fibrillation  Patient Measurements: Height:  (190.5 cm) Weight: 113.4 kg (250 lb) IBW/kg (Calculated) : 84.5 Heparin Dosing Weight: 108 kg  Labs: Recent Labs    08/19/22 1123 08/20/22 0831 08/21/22 0517 08/21/22 0900 08/21/22 2244 08/22/22 0716  HGB 9.5* 8.8*  --  9.2*  --  8.5*  HCT 28.6* 26.7*  --  27.8*  --  25.9*  PLT 276 282  --  331  --  302  HEPARINUNFRC  --   --   --   --  0.23* 0.37  CREATININE 1.49*  --  1.36*  --   --  1.26*    Estimated Creatinine Clearance: 71 mL/min (A) (by C-G formula based on SCr of 1.26 mg/dL (H)).  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 male w/ PMH of DM, periph neuropathy, HTN, HLD, CAD-CABG, CKD admitted for cellulitis now with new onset atrial fibrillation. He is on no chronic anticoagulation.  Goal of Therapy:  Heparin level 0.3-0.7 units/ml Monitor platelets by anticoagulation protocol: Yes   Plan:  --Heparin level is therapeutic x 1 --Continue heparin infusion at 1900 units/hr --Re-check confirmatory HL in 8 hours --Daily CBC per protocol while on IV heparin --Follow-up transition to oral anticoagulation when appropriate  Tressie Ellis 08/22/2022,8:29 AM

## 2022-08-22 NOTE — Progress Notes (Signed)
Inpatient Rehab Admissions Coordinator:    CIR consult received. Pt. Not medically ready for CIR. Plans for I&D tomorrow. Will follow up once Pt. Closer to being ready for d/c.  Megan Salon, MS, CCC-SLP Rehab Admissions Coordinator  [phone] (celll) [phone] (office)

## 2022-08-22 NOTE — Progress Notes (Signed)
ANTICOAGULATION CONSULT NOTE  Pharmacy Consult for heparin infusion Indication: atrial fibrillation  Patient Measurements: Height:  (190.5 cm) Weight: 113.4 kg (250 lb) IBW/kg (Calculated) : 84.5 Heparin Dosing Weight: 108 kg  Labs: Recent Labs    08/20/22 0831 08/21/22 0517 08/21/22 0900 08/21/22 2244 08/22/22 0716  HGB 8.8*  --  9.2*  --  8.5*  HCT 26.7*  --  27.8*  --  25.9*  PLT 282  --  331  --  302  HEPARINUNFRC  --   --   --  0.23* 0.37  CREATININE  --  1.36*  --   --  1.26*    Estimated Creatinine Clearance: 71 mL/min (A) (by C-G formula based on SCr of 1.26 mg/dL (H)).  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 male w/ PMH of DM, periph neuropathy, HTN, HLD, CAD-CABG, CKD admitted for cellulitis now with new onset atrial fibrillation. He is on no chronic anticoagulation.  Goal of Therapy:  Heparin level 0.3-0.7 units/ml Monitor platelets by anticoagulation protocol: Yes   Plan:  Heparin infusion set to start at 17:30 per consult note. Check heparin level in 8 hours. CBC daily while on heparin.   Ronnald Ramp, PharmD, BCPS 08/22/2022,4:46 PM

## 2022-08-22 NOTE — TOC Initial Note (Signed)
Transition of Care [facility]) - Initial/Assessment Note    Patient Details  Name: Andrew Harding MRN: [id number] Date of Birth: 18-Nov-1950  Transition of Care [facility]) CM/SW Contact:    Chapman Fitch, RN Phone Number: 08/22/2022, 2:15 PM  Clinical Narrative:                  Therapy recommending CIR.  CIR medical director has reviewed and per note fees that patient is an appropriative admission.  Patient will require authorization prior to admit  Patient not medically ready for discharge Per Podiatry "residual abscess in the left foot and will need to be I&D then debrided. I discussed with the patient the plan for I&D and debridement tomorrow"       Patient Goals and CMS Choice            Expected Discharge Plan and Services                                              Prior Living Arrangements/Services                       Activities of Daily Living Home Assistive Devices/Equipment: Cane (specify quad or straight), Wheelchair ADL Screening (condition at time of admission) Patient's cognitive ability adequate to safely complete daily activities?: Yes Is the patient deaf or have difficulty hearing?: No Does the patient have difficulty seeing, even when wearing glasses/contacts?: No Does the patient have difficulty concentrating, remembering, or making decisions?: No Patient able to express need for assistance with ADLs?: Yes Does the patient have difficulty dressing or bathing?: No Independently performs ADLs?: Yes (appropriate for developmental age) Does the patient have difficulty walking or climbing stairs?: Yes Weakness of Legs: Both Weakness of Arms/Hands: None  Permission Sought/Granted                  Emotional Assessment              Admission diagnosis:  AKI (acute kidney injury) [N17.9] Sepsis [A41.9] Cellulitis of other specified site [L03.818] Atrial fibrillation, unspecified type [I48.91] Sepsis, due to unspecified  organism, unspecified whether acute organ dysfunction present [A41.9] Patient Active Problem List   Diagnosis Date Noted   Infection of left foot 08/19/2022   Atrial fibrillation 08/19/2022   Sepsis 08/17/2022   Cellulitis 08/17/2022   Hypotension due to hypovolemia 08/17/2022   Gout 04/05/2018   Vitamin D deficiency, unspecified 04/05/2018   Diabetic ulcer of toe of left foot associated with type 2 diabetes mellitus, limited to breakdown of skin 12/21/2017   Status post amputation of toe of right foot 11/01/2015   CKD (chronic kidney disease) stage 3, GFR 30-59 ml/min 10/08/2015   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 [place] [address] Phone: [phone] Fax: [phone]     Social Determinants of Health (  SDOH) Social History: SDOH Screenings   Food Insecurity: No Food Insecurity (08/19/2022)  Housing: Low Risk  (08/19/2022)  Transportation Needs: No Transportation Needs (08/19/2022)  Utilities: Not At Risk (08/19/2022)  Tobacco Use: High Risk (08/21/2022)   SDOH Interventions:     Readmission Risk Interventions     No data to display
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
## 2022-08-22 NOTE — Progress Notes (Signed)
[facility] CLINIC CARDIOLOGY CONSULT NOTE       Patient ID: [name] MRN: [id number] DOB/AGE: Jul 27, 1950 72 y.o.  Admit date: 08/17/2022 Referring Physician Dr. Enedina Finner Primary Physician Dr. Daniel Nones  Primary Cardiologist previous Dr. Gwen Pounds  Reason for Consultation new AF  HPI: Andrew Harding is a 72yoM with a PMH of CAD s/p CABG x 4 (2014, [facility]), PAD, DM2, who presented to [facility] ED 08/17/2022 from his podiatrist office worsening swelling and erythema to his left foot.  He is now s/p Left TMA x 3 on 4/20 with podiatry with ongoing treatment for cellulitis and wound necrosis. Vascular surgery planning LE angiogram on 4/23. Cardiology is following for new onset paroxysmal AF discovered on admission EKG.   Interval History:  - no acute events - NPO for LE angiogram this afternoon - no chest pain, shortness of breath, palpitations, fever/chills - in AF on tele, rate controlled in the 80s-90s   Review of systems complete and found to be negative unless listed above     Past Medical History:  Diagnosis Date   Bladder neck obstruction    Chronic kidney disease    Coronary artery disease    a.) s/p 4v CABG in 2014   Diabetes mellitus without complication    Diabetic neuropathy    Diabetic peripheral neuropathy    Diverticulosis    Gout    Heart murmur    Hypercholesteremia    Hyperlipidemia    Hypertension    Peripheral neuropathy    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   Tubular adenoma    Vitamin D deficiency     Past Surgical History:  Procedure Laterality Date   AMPUTATION Left 08/19/2022   Procedure: TRANSMETATARSAL AMPUTATION LEFT FOOT WITH IRRIGATION AND DEBRIDEMENT;  Surgeon: Rosetta Posner, DPM;  Location: [facility] ORS;  Service: Podiatry;  Laterality: Left;   AMPUTATION TOE Right 06/01/2015   Procedure: AMPUTATION TOE;  Surgeon: Recardo Evangelist, DPM;  Location: [facility] ORS;  Service: Podiatry;  Laterality: Right;   AMPUTATION TOE Left 08/11/2022   Procedure: AMPUTATION TOE 2, 3, 4;   Surgeon: [name], DPM;  Location: [facility] ORS;  Service: Podiatry;  Laterality: Left;   CATARACT EXTRACTION, BILATERAL     CIRCUMCISION N/A 06/12/2022   Procedure: CIRCUMCISION ADULT;  Surgeon: Vanna Scotland, MD;  Location: [facility] ORS;  Service: Urology;  Laterality: N/A;   COLONOSCOPY WITH PROPOFOL N/A 02/14/2016   Procedure: COLONOSCOPY WITH PROPOFOL;  Surgeon: Christena Deem, MD;  Location: [facility] ENDOSCOPY;  Service: Endoscopy;  Laterality: N/A;   COLONOSCOPY WITH PROPOFOL N/A 01/07/2019   Procedure: COLONOSCOPY WITH PROPOFOL;  Surgeon: Christena Deem, MD;  Location: [facility] ENDOSCOPY;  Service: Endoscopy;  Laterality: N/A;   CORONARY ARTERY BYPASS GRAFT N/A 08/2012   EXCISION PARTIAL PHALANX Right 06/01/2015   Procedure: EXCISION PARTIAL PHALANX /  BONE;  Surgeon: Recardo Evangelist, DPM;  Location: [facility] ORS;  Service: Podiatry;  Laterality: Right;   FLEXIBLE SIGMOIDOSCOPY N/A 05/29/2016   Procedure: FLEXIBLE SIGMOIDOSCOPY;  Surgeon: Christena Deem, MD;  Location[facility] END[facility];  Service: Endoscopy;  Laterality: N/A;   KNEE ARTHROSCOPY Left     Medications Prior to Admission  Medication Sig Dispense Refill Last Dose   aspirin EC 81 MG tablet Take 81 mg by mouth daily.   08/17/2022   betamethasone dipropionate 0.05 % cream Apply topically 2 (two) times daily. 30 g 0 Past Week   [EXPIRED] cefdinir (OMNICEF) 300 MG capsule Take 300 mg by mouth 2 (two)  times daily.   08/17/2022   cholecalciferol (VITAMIN D) 1000 units tablet Take 2,000 Units by mouth daily.   08/17/2022   gabapentin (NEURONTIN) 300 MG capsule Take 300 mg by mouth 3 (three) times daily.   08/17/2022   insulin glargine (LANTUS) 100 UNIT/ML injection Inject 52 Units into the skin at bedtime.   08/16/2022   lisinopril (ZESTRIL) 20 MG tablet Take 20 mg by mouth daily.   08/17/2022   metFORMIN (GLUCOPHAGE-XR) 500 MG 24 hr tablet 500 mg 2 (two) times daily with a meal.   08/17/2022   NOVOLOG FLEXPEN 100 UNIT/ML FlexPen Inject 15-20  Units into the skin 3 (three) times daily with meals.   08/17/2022   pravastatin (PRAVACHOL) 80 MG tablet TAKE ONE TABLET BY MOUTH AT NIGHT   08/16/2022   silver sulfADIAZINE (SILVADENE) 1 % cream Apply topically.   Past Week   traZODone (DESYREL) 50 MG tablet TAKE ONE TABLET AT BEDTIME AS NEEDED   08/16/2022   TRULICITY 4.5 MG/0.5ML SOPN Inject into the skin.   08/13/2022   glucose blood (ONETOUCH ULTRA) test strip Use 3 (three) times daily      Insulin Pen Needle (FIFTY50 PEN NEEDLES) 32G X 4 MM MISC USE 4 TIMES DAILY      Social History   Socioeconomic History   Marital status: Married    Spouse name: Ashley,Angela C   Number of children: Not on file   Years of education: Not on file   Highest education level: Not on file  Occupational History   Not on file  Tobacco Use   Smoking status: Some Days    Types: Cigars   Smokeless tobacco: Never  Vaping Use   Vaping Use: Never used  Substance and Sexual Activity   Alcohol use: No   Drug use: No   Sexual activity: Never  Other Topics Concern   Not on file  Social History Narrative   Lives at home by himself. Independent at baseline   Social Determinants of Health   Financial Resource Strain: Not on file  Food Insecurity: No Food Insecurity (08/19/2022)   Hunger Vital Sign    Worried About Running Out of Food in the Last Year: Never true    Ran Out of Food in the Last Year: Never true  Transportation Needs: No Transportation Needs (08/19/2022)   PRAPARE - Administrator, Civil Service (Medical): No    Lack of Transportation (Non-Medical): No  Physical Activity: Not on file  Stress: Not on file  Social Connections: Not on file  Intimate Partner Violence: Not At Risk (08/19/2022)   Humiliation, Afraid, Rape, and Kick questionnaire    Fear of Current or Ex-Partner: No    Emotionally Abused: No    Physically Abused: No    Sexually Abused: No    Family History  Problem Relation Age of Onset   Diabetes Mellitus II  Mother    CAD Mother       Intake/Output Summary (Last 24 hours) at 08/22/2022 0803 Last data filed at 08/22/2022 0727 Gross per 24 hour  Intake 1837.21 ml  Output 1500 ml  Net 337.21 ml     Vitals:   08/21/22 1904 08/21/22 2236 08/22/22 0426 08/22/22 0801  BP: 122/72 139/85 131/61 137/75  Pulse: 79 83 62 84  Resp: 20 17 18 18   Temp: 99.2 F (37.3 C)  98.4 F (36.9 C) 98.1 F (36.7 C)  TempSrc: Oral     SpO2: 97% 97% 97%  98%  Weight:      Height:        PHYSICAL EXAM General: Pleasant Caucasian male , well nourished, in no acute distress. Sitting in recliner with wife at bedside HEENT:  Normocephalic and atraumatic. Neck:  No JVD.  Lungs: Normal respiratory effort on room air. Clear bilaterally to auscultation. No wheezes, crackles, rhonchi.  Heart: irregular irregular with controlled rate  . Normal S1 and S2 without gallops or murmurs.  Abdomen: Non-distended appearing.  Msk: Normal strength and tone for age. Extremities: L foot with ace bandage in place Neuro: Alert and oriented X 3. Psych:  Answers questions appropriately.   Labs: Basic Metabolic Panel: Recent Labs    08/19/22 1123 08/21/22 0515 08/21/22 0517 08/22/22 0716  NA 134*  --   --  135  K 3.9  --   --  3.9  CL 104  --   --  104  CO2 23  --   --  23  GLUCOSE 157*  --   --  113*  BUN 18  --   --  20  CREATININE 1.49*  --  1.36* 1.26*  CALCIUM 8.1*  --   --  8.2*  MG  --  1.7  --   --     Liver Function Tests: Recent Labs    08/19/22 1123  AST 126*  ALT 153*  ALKPHOS 127*  BILITOT 0.9  PROT 6.6  ALBUMIN 2.4*    No results for input(s): "LIPASE", "AMYLASE" in the last 72 hours. CBC: Recent Labs    08/21/22 0900 08/22/22 0716  WBC 15.9* 10.6*  HGB 9.2* 8.5*  HCT 27.8* 25.9*  MCV 92.7 92.8  PLT 331 302    Cardiac Enzymes: No results for input(s): "CKTOTAL", "CKMB", "CKMBINDEX", "TROPONINIHS" in the last 72 hours. BNP: No results for input(s): "BNP" in the last 72  hours. D-Dimer: No results for input(s): "DDIMER" in the last 72 hours. Hemoglobin A1C: No results for input(s): "HGBA1C" in the last 72 hours. Fasting Lipid Panel: No results for input(s): "CHOL", "HDL", "LDLCALC", "TRIG", "CHOLHDL", "LDLDIRECT" in the last 72 hours. Thyroid Function Tests: No results for input(s): "TSH", "T4TOTAL", "T3FREE", "THYROIDAB" in the last 72 hours.  Invalid input(s): "FREET3" Anemia Panel: No results for input(s): "VITAMINB12", "FOLATE", "FERRITIN", "TIBC", "IRON", "RETICCTPCT" in the last 72 hours.   Radiology: DG Foot Complete Left  Result Date: 08/19/2022 CLINICAL DATA:  Postop. EXAM: LEFT FOOT - COMPLETE 3+ VIEW COMPARISON:  Preoperative exam earlier today FINDINGS: Interval transmetatarsal amputation of all 5 rays. Presumed antibiotic beads projecting over the mid metatarsals as well as the ankle at the level of the medial malleolus. Overlying skin staples in place. IMPRESSION: Interval transmetatarsal amputation of all 5 rays. Presumed antibiotic beads projecting over the mid metatarsals and medial ankle. Electronically Signed   By: Narda Rutherford M.D.   On: 08/19/2022 16:48   DG Foot 2 Views Left  Result Date: 08/19/2022 CLINICAL DATA:  Infection, patient reports recent surgery. EXAM: LEFT FOOT - 2 VIEW COMPARISON:  Foot MRI yesterday FINDINGS: Prior amputation of the second and third toes as well as fourth toe at the proximal aspect of the proximal phalanx. Focal defect involving the residual fourth toe proximal phalanx as seen on prior MRI. No definite radiographic correlate to the edematous changes in the second and third metatarsal heads on MRI. No evidence of acute fracture. Soft tissue edema is seen over the dorsum of the forefoot. Vascular calcifications. IMPRESSION: 1. Prior amputation  of the second and third toes as well as fourth toe at the proximal phalanx. Small defect in the fourth toe proximal phalanx corresponding to MRI findings. No  radiographic correlate to the marrow edema changes in the second and third metatarsal heads on MRI. 2. Forefoot soft tissue edema. Electronically Signed   By: Narda Rutherford M.D.   On: 08/19/2022 16:45   MR FOOT LEFT WO CONTRAST  Result Date: 08/18/2022 CLINICAL DATA:  Foot swelling, diabetic, osteomyelitis suspected, xray done EXAM: MRI OF THE LEFT FOOT WITHOUT CONTRAST TECHNIQUE: Multiplanar, multisequence MR imaging of the left forefoot was performed. No intravenous contrast was administered. COMPARISON:  None Available. FINDINGS: Bones/Joint/Cartilage Postsurgical changes of amputation of the second and third toes at the metatarsophalangeal joint level. Prior fourth toe amputation through the base of the proximal phalanx. Surgical defect or focal erosion involving the distal aspect of the residual fourth toe proximal phalanx (series 9, image 14). There is periosteal edema of the second and third metatarsal heads with relatively mild bone marrow edema. Prominent bone marrow edema within the first and third metatarsal diaphyses and at the proximal metaphysis of the second metatarsal. No well-defined fracture line. Normal alignment. Relatively mild degenerative changes. Ligaments Intact Lisfranc ligament. Remaining collateral ligaments are intact. Muscles and Tendons Chronic denervation changes of the foot musculature. Amputation changes of the musculotendinous structures of the second through fourth toes. No tenosynovitis. Soft tissues Soft tissue wound or ulceration at the dorsal aspect of the forefoot overlying the second metatarsal head. Additional shallow wounds or ulcerations dorsal to the third and fourth metatarsal heads. Extensive soft tissue edema. No organized or drainable fluid collections. IMPRESSION: 1. Postsurgical changes of amputation of the second through fourth toes. Soft tissue wounds or ulcerations at the dorsal aspect of the forefoot overlying the second metatarsal head. Additional  shallow wounds or ulcerations dorsal to the third and fourth metatarsal heads. 2. Small postsurgical defect or focal erosion involving the distal aspect of the residual fourth toe proximal phalanx. Appearance is suspicious for osteomyelitis. 3. Periosteal edema of the second and third metatarsal heads with relatively mild bone marrow edema. Findings are suspicious for early acute osteomyelitis. 4. Prominent bone marrow edema within the first and third metatarsal diaphyses and at the proximal metaphysis of the second metatarsal. No well-defined fracture line. Findings are favored to represent stress-related changes. 5. Extensive soft tissue edema. No organized or drainable fluid collections. Electronically Signed   By: Duanne Guess D.O.   On: 08/18/2022 20:24   CT Angio Chest PE W and/or Wo Contrast  Result Date: 08/17/2022 CLINICAL DATA:  Sepsis. EXAM: CT ANGIOGRAPHY CHEST WITH CONTRAST TECHNIQUE: Multidetector CT imaging of the chest was performed using the standard protocol during bolus administration of intravenous contrast. Multiplanar CT image reconstructions and MIPs were obtained to evaluate the vascular anatomy. RADIATION DOSE REDUCTION: This exam was performed according to the departmental dose-optimization program which includes automated exposure control, adjustment of the mA and/or kV according to patient size and/or use of iterative reconstruction technique. CONTRAST:  60mL OMNIPAQUE IOHEXOL 350 MG/ML SOLN COMPARISON:  Chest x-ray 08/17/2022 earlier FINDINGS: Cardiovascular: No pulmonary embolism identified. Status post median sternotomy. Heart is nonenlarged. No significant pericardial effusion. The thoracic aorta has a normal course and caliber with mild atherosclerotic calcified plaque. Coronary artery calcifications are noted. 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 Mediastinum/Nodes: Mildly patulous esophagus. No specific abnormal lymph node enlargement seen in the  axillary region, hilum or mediastinum. Lungs/Pleura: Mild  diffuse breathing motion. No consolidation, pneumothorax or effusion. Dependent atelectasis. 3 mm nodule right upper lobe on series 5, image 77 is noncalcified. Similar focus on image 56 but this has a component of calcification peripherally. Upper Abdomen: Gallstones in the nondilated gallbladder. There is dystrophic calcification along the left adrenal gland. Please correlate with any previous infectious, inflammatory or traumatic process. This was seen on the CT scan of 2014 of the abdomen and pelvis. Musculoskeletal: Mild degenerative changes along the spine. Review of the MIP images confirms the above findings. IMPRESSION: No pulmonary embolism identified.  Mild breathing motion. Postop chest. Gallstones. 3 mm lung nodules. No follow-up needed if patient is low-risk (and has no known or suspected primary neoplasm). Non-contrast chest CT can be considered in 12 months if patient is high-risk. This recommendation follows the consensus statement: Guidelines for Management of Incidental Pulmonary Nodules Detected on CT Images: From the Fleischner Society 2017; Radiology 2017; 284:228-243. Aortic Atherosclerosis (ICD10-I70.0). Electronically Signed   By: Karen Kays M.D.   On: 08/17/2022 13:43   CT HEAD WO CONTRAST ( )  Result Date: 08/17/2022 CLINICAL DATA:  Possible wound infection to left foot EXAM: CT HEAD WITHOUT CONTRAST TECHNIQUE: Contiguous axial images were obtained from the base of the skull through the vertex without intravenous contrast. RADIATION DOSE REDUCTION: This exam was performed according to the departmental dose-optimization program which includes automated exposure control, adjustment of the mA and/or kV according to patient size and/or use of iterative reconstruction technique. COMPARISON:  None Available. FINDINGS: Brain: No evidence of acute infarction, hemorrhage, mass, mass effect, or midline shift. No hydrocephalus or  extra-axial fluid collection. Vascular: No hyperdense vessel. Atherosclerotic calcifications in the intracranial carotid and vertebral arteries. Skull: Negative for fracture or focal lesion. Sinuses/Orbits: Mucosal thickening in the ethmoid air cells and left maxillary sinus. No acute finding in the orbits. Status post bilateral lens replacements. Other: The mastoid air cells are well aerated. IMPRESSION: No acute intracranial process. Electronically Signed   By: Wiliam Ke M.D.   On: 08/17/2022 13:35   DG Chest 2 View  Result Date: 08/17/2022 CLINICAL DATA:  Sepsis EXAM: CHEST - 2 VIEW COMPARISON:  CXR 07/19/12 FINDINGS: Status median sternotomy and CABG. No pleural effusion. No pneumothorax. Normal cardiac and mediastinal contours. No focal airspace opacity. No radiographically apparent displaced rib fractures. Vertebral body heights are visualized upper abdomen is unremarkable IMPRESSION: No focal airspace opacity Electronically Signed   By: Lorenza Cambridge M.D.   On: 08/17/2022 10:20   DG MINI C-ARM IMAGE ONLY  Result Date: 08/11/2022 There is no interpretation for this exam.  This order is for images obtained during a surgical procedure.  Please See "Surgeries" Tab for more information regarding the procedure.    ECHO 09/19/2012 INTERPRETATION ---------------------------------------------------------------   NORMAL LEFT VENTRICULAR SYSTOLIC FUNCTION   NORMAL RIGHT VENTRICULAR SYSTOLIC FUNCTION   VALVULAR REGURGITATION: TRIVIAL TR   NO VALVULAR STENOSIS   DEFINITY IMAGING AGENT USED   NO PRIOR FOR COMPARISON   (Report version 3.0)                    Interpreted and Electronically signed  Perform. by: Sarina Ill, RDCS               by: Chip Boer, MD  Resp.Person: Sarina Ill, RDCS               On: 09/19/2012 11:58:55  Resp. Staff: Hollice Espy, RN Exam End: 09/19/12 09:57  TELEMETRY reviewed by me (LT) 08/22/2022 : AF rate 80s-90s   EKG reviewed by me: AF  93bpm   Data reviewed by me (LT) 08/22/2022: hospitalist progress note, PT/OT notes, last 24h vitals tele labs imaging I/O    Principal Problem:   Sepsis Active Problems:   Type 2 diabetes mellitus with stage 3b chronic kidney disease, with long-term current use of insulin   Cellulitis   Hypotension due to hypovolemia   Infection of left foot   Atrial fibrillation    ASSESSMENT AND PLAN:  Andrew Leep. Donley is a 72yoM with a PMH of CAD s/p CABG x 4 (2014, [facility]), PAD, DM2, who presented to [facility] ED 08/17/2022 from his podiatrist office worsening swelling and erythema to his left foot.  He is now s/p Left TMA x 3 on 4/20 with podiatry with ongoing treatment for cellulitis and wound necrosis. Vascular surgery planning LE angiogram on 4/23. Cardiology is following for new onset paroxysmal AF discovered on admission EKG.   # sepsis 2/2 L foot cellulitis and wound necrosis  # s/p Left TMA x 3 with I&D to left foot and ankle on 4/20  -Agree with current therapy per primary team, podiatry, and vascular surgery -Vascular planning for LE angiogram on today  # new onset paroxysmal AF  Discovered on initial EKG, new in onset. Possibly provoked in the setting of sepsis/infection. Rate controlled on tele today in the 80s-90s, asymptomatic. -Continuous monitoring on telemetry while inpatient -continue metoprolol tartrate 12.5 mg twice daily for rate control -continue heparin infusion per pharmacy protocol for anticoagulation, likely discharge on a DOAC + single/dual antiplatelet therapy based on angiogram findings. CHADSVASC 4 (age, HTN, CAD/PAD, DM2) -TSH within normal limits -CMP with LFT still elevated above baseline today with AST ALT 157/119 respectively. On 4/20 AST/ALT 126/153. Hold pravastatin as below -Echo complete pending read - discussed rate vs rhythm control strategy with patient & his wife. Will consider rhythm control with amiodarone (if transaminitis resolves) vs sotalol pending clinical course  and possible DCCV after 4 weeks of appropriate anticoagulation.   # CAD s/p CABG x 4 without chest pain Continue aspirin 81 mg daily - hold pravastatin 80 daily with transaminitis  -hold lisinopril 20 mg for now, likely restart at discharge  This patient's plan of care was discussed and created with Dr. Juliann Pares and he is in agreement.  [address] , PA-C 08/22/22, 10:07AM [facility] Card[facility]

## 2022-08-22 NOTE — Progress Notes (Addendum)
[facility]  - [place] at [facility]   PATIENT NAME: Andrew Harding    MR#:  [id number]  DATE OF BIRTH:  [birth year]  SUBJECTIVE:   Wife at bedside. Sugars stable. Overall stable NPO for LE angiogram today  VITALS:  Blood pressure 137/75, pulse 84, temperature 98.1 F (36.7 C), resp. rate 18, height  (1.905 m), weight 113.4 kg, SpO2 98 %.  PHYSICAL EXAMINATION:   GENERAL:  72 y.o.-year-old patient with no acute distress.  LUNGS: Normal breath sounds bilaterally, no wheezing CARDIOVASCULAR: S1, S2 normal. No murmur   ABDOMEN: Soft, nontender, nondistended. Bowel sounds present.  EXTREMITIES: From  08/18/2022  08/20/2022--  NEUROLOGIC: nonfocal  patient is alert and awake, peripheral neuropathy SKIN: as above  LABORATORY PANEL:  CBC Recent Labs  Lab 08/22/22 0716  WBC 10.6*  HGB 8.5*  HCT 25.9*  PLT 302     Chemistries  Recent Labs  Lab 08/21/22 0515 08/21/22 0517 08/22/22 0716  NA  --   --  135  K  --   --  3.9  CL  --   --  104  CO2  --   --  23  GLUCOSE  --   --  113*  BUN  --   --  20  CREATININE  --    < > 1.26*  CALCIUM  --   --  8.2*  MG 1.7  --   --   AST  --   --  157*  ALT  --   --  119*  ALKPHOS  --   --  162*  BILITOT  --   --  0.6   < > = values in this interval not displayed.    Cardiac Enzymes No results for input(s): "TROPONINI" in the last 168 hours. RADIOLOGY:  No results found.  Assessment and Plan  Andrew Harding is a 72 y.o. 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 Irene Limbo office after he went for wound check and noted upside was swollen and had erythema. Patient also had a syncopal episode couple days ago at home. He tells me he is not been his usual baseline and thinks he is not eating and drinking well.   Sepsis secondary to cellulitis left foot  postoperatively left second third fourth toe necrosis status post amputation one week ago -- patient came in with hypotension, tachycardia, elevated white count and cellulitis -- blood pressure improved after 2 L of IV fluids. -- Received IV vancomycin and Rocephin-- will change to IV cefepime +Vacnomycin -- podiatry consultation with Dr. Lodema Pilot Baker--recommends TMT with possible further exploration --WC MODERATE ENTEROBACTER CLOACAE  MODERATE ENTEROCOCCUS FAECALIS  --BC negative --ID consultation on Monday --s/p Left foot transmetatarsal amputation on 08/19/22 Left foot and ankle incision and drainage with removal of all nonviable necrotic tissue. Application of antibiotic beads --vascular surgery  consult noted--Angiogram today to see Left LE circulation --Dr Ether Griffins plans for I and tomorrow 4/24--still oozing pus  Atrial fibrillation, New, rate controlled H/o CAD,s/p CABG x1 in 2014 --repeat EKG showed Afib -- patient denies chest pain shortness of breath and palpitations -anesthesia requires cardiology clearance. -- Discussed with cardiology Dr. Welton Flakes--- recommends proceeding with surgery --pt started on IV heparin gtt per [facility] cardiology --will need to be on eliquis at d/c   Uncontrolled type II diabetes with peripheral neuropathy and CKD stage IIIB --  baseline creatinine around 1.4 -- creatinine 2.02-- 1.4 -- continue insulin and sliding scale -- Pt counselled on carb control diet -- patient at home on Lantus, metformin,Trulicity --A1c 6.9%   Hypotension with syncopal episode in the setting of pre-renal azotemia in post-op period -- CT head negative -- CT chest negative for PE -- blood pressure improved with IV fluids   CKD stage IIIB -- as above   Hyperlipidemia -- continue statins      Advance Care Planning:   Code Status: Full Code   Consults: Podiatry  Family Communication:  none today DVT Prophylaxis :lovenox Level of care: Med-Surg Status is:  Inpatient Remains inpatient appropriate because: foot infection    TOTAL TIME TAKING CARE OF THIS PATIENT: 35 minutes.  >50% time spent on counselling and coordination of care  Note: This dictation was prepared with Dragon dictation along with smaller phrase technology. Any transcriptional errors that result from this process are unintentional.  Enedina Finner M.D    [facility]   CC: Primary care physician; Lynnea Ferrier, MD

## 2022-08-22 NOTE — Progress Notes (Signed)
Daily Progress Note   Subjective  - 3 Days Post-Op  Follow-up left foot transmetatarsal amputation with I&D of midfoot and ankle.  Patient resting comfortably.  Awaiting vascular procedure for this afternoon.  Denies any pain though patient is neuropathic.  Objective Vitals:   08/21/22 1904 08/21/22 2236 08/22/22 0426 08/22/22 0801  BP: 122/72 139/85 131/61 137/75  Pulse: 79 83 62 84  Resp: Temp: 99.2 F (37.3 C)  98.4 F (36.9 C) 98.1 F (36.7 C)  TempSrc: Oral     SpO2: 97% 97% 97% 98%  Weight:      Height:        Physical Exam: Transmetatarsal amputation site was evaluated today.  Still noted purulent drainage from the distal incision site at this time.  Also from the medial arch upon removing the packing there was some purulence noted at this level.  Along the medial ankle no purulence noted or erythema at the medial ankle region.  Wound culture result shows Enterobacter as well as Enterococcus. 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  I discussed with the patient the plan for I&D and debridement tomorrow.  We discussed the risk and benefits associated with surgery.  Will plan on surgery tomorrow afternoon.  Orders have been placed.   [name] A  08/22/2022, 1:32 PM

## 2022-08-22 NOTE — Interval H&P Note (Signed)
History and Physical Interval Note:  08/22/2022 3:01 PM  Andrew Harding  has presented today for surgery, with the diagnosis of PAD.  The various methods of treatment have been discussed with the patient and family. After consideration of risks, benefits and other options for treatment, the patient has consented to  Procedure(s): Lower Extremity Angiography (Left) as a surgical intervention.  The patient's history has been reviewed, patient examined, no change in status, stable for surgery.  I have reviewed the patient's chart and labs.  Questions were answered to the patient's satisfaction.     Levora Dredge

## 2022-08-23 ENCOUNTER — Encounter: Payer: Self-pay | Admitting: Vascular Surgery

## 2022-08-23 ENCOUNTER — Inpatient Hospital Stay: Payer: Managed Care, Other (non HMO) | Admitting: Anesthesiology

## 2022-08-23 ENCOUNTER — Encounter
Admission: EM | Disposition: A | Discharge status: Rehabilitation Facility | Payer: BC Managed Care – PPO | Source: Home / Self Care | Attending: Internal Medicine

## 2022-08-23 DIAGNOSIS — A419 Sepsis, unspecified organism: Secondary | ICD-10-CM | POA: Diagnosis not present

## 2022-08-23 HISTORY — PX: INCISION AND DRAINAGE: SHX5863

## 2022-08-23 LAB — ECHOCARDIOGRAM COMPLETE
AR max vel: 3.11 cm2
AV Area VTI: 3.63 cm2
AV Area mean vel: 3.41 cm2
AV Mean grad: 2 mmHg
AV Peak grad: 3.8 mmHg
Ao pk vel: 0.97 m/s
Area-P 1/2: 4.63 cm2
MV VTI: 3.12 cm2
S' Lateral: 2.7 cm
Weight: 4000 oz

## 2022-08-23 LAB — HEPARIN LEVEL (UNFRACTIONATED)
Heparin Unfractionated: 0.29 IU/mL — ABNORMAL LOW (ref 0.30–0.70)
Heparin Unfractionated: 0.44 IU/mL (ref 0.30–0.70)

## 2022-08-23 LAB — CBC
HCT: 27.6 % — ABNORMAL LOW (ref 39.0–52.0)
Hemoglobin: 8.8 g/dL — ABNORMAL LOW (ref 13.0–17.0)
MCH: 30.1 pg (ref 26.0–34.0)
MCHC: 31.9 g/dL (ref 30.0–36.0)
MCV: 94.5 fL (ref 80.0–100.0)
Platelets: 356 10*3/uL (ref 150–400)
RBC: 2.92 MIL/uL — ABNORMAL LOW (ref 4.22–5.81)
RDW: 12.8 % (ref 11.5–15.5)
WBC: 9.4 10*3/uL (ref 4.0–10.5)
nRBC: 0 % (ref 0.0–0.2)

## 2022-08-23 LAB — COMPREHENSIVE METABOLIC PANEL
ALT: 99 U/L — ABNORMAL HIGH (ref 0–44)
AST: 98 U/L — ABNORMAL HIGH (ref 15–41)
Albumin: 2.2 g/dL — ABNORMAL LOW (ref 3.5–5.0)
Alkaline Phosphatase: 183 U/L — ABNORMAL HIGH (ref 38–126)
Anion gap: 8 (ref 5–15)
BUN: 21 mg/dL (ref 8–23)
CO2: 22 mmol/L (ref 22–32)
Calcium: 8.1 mg/dL — ABNORMAL LOW (ref 8.9–10.3)
Chloride: 103 mmol/L (ref 98–111)
Creatinine, Ser: 1.27 mg/dL — ABNORMAL HIGH (ref 0.61–1.24)
GFR, Estimated: 60 mL/min — ABNORMAL LOW (ref >60)
Glucose, Bld: 242 mg/dL — ABNORMAL HIGH (ref 70–99)
Potassium: 4.2 mmol/L (ref 3.5–5.1)
Sodium: 133 mmol/L — ABNORMAL LOW (ref 135–145)
Total Bilirubin: 0.7 mg/dL (ref 0.3–1.2)
Total Protein: 6.3 g/dL — ABNORMAL LOW (ref 6.5–8.1)

## 2022-08-23 LAB — GLUCOSE, CAPILLARY
Glucose-Capillary: 147 mg/dL — ABNORMAL HIGH (ref 70–99)
Glucose-Capillary: 143 mg/dL — ABNORMAL HIGH (ref 70–99)
Glucose-Capillary: 137 mg/dL — ABNORMAL HIGH (ref 70–99)
Glucose-Capillary: 115 mg/dL — ABNORMAL HIGH (ref 70–99)
Glucose-Capillary: 114 mg/dL — ABNORMAL HIGH (ref 70–99)
Glucose-Capillary: 163 mg/dL — ABNORMAL HIGH (ref 70–99)

## 2022-08-23 LAB — SURGICAL PATHOLOGY

## 2022-08-23 LAB — AEROBIC/ANAEROBIC CULTURE W GRAM STAIN (SURGICAL/DEEP WOUND): Gram Stain: NONE SEEN

## 2022-08-23 LAB — HEPATITIS PANEL, ACUTE
HCV Ab: NONREACTIVE
Hep A IgM: NONREACTIVE
Hep B C IgM: NONREACTIVE
Hepatitis B Surface Ag: NONREACTIVE

## 2022-08-23 LAB — HIV ANTIBODY (ROUTINE TESTING W REFLEX): HIV Screen 4th Generation wRfx: NONREACTIVE

## 2022-08-23 SURGERY — INCISION AND DRAINAGE
Anesthesia: General | Site: Foot | Laterality: Left

## 2022-08-23 MED ORDER — MIDAZOLAM HCL 2 MG/2ML IJ SOLN
INTRAMUSCULAR | Status: DC | PRN
  Administered 2022-08-23 (×2): .5 mg via INTRAVENOUS
  Administered 2022-08-23: 1 mg via INTRAVENOUS

## 2022-08-23 MED ORDER — LIDOCAINE HCL (PF) 2 % IJ SOLN
INTRAMUSCULAR | Status: AC
  Filled 2022-08-23: qty 5

## 2022-08-23 MED ORDER — PROPOFOL 500 MG/50ML IV EMUL
INTRAVENOUS | Status: DC | PRN
  Administered 2022-08-23: 100 ug/kg/min via INTRAVENOUS

## 2022-08-23 MED ORDER — LIDOCAINE-EPINEPHRINE 1 %-1:100000 IJ SOLN
INTRAMUSCULAR | Status: AC
  Filled 2022-08-23: qty 1

## 2022-08-23 MED ORDER — BUPIVACAINE HCL 0.5 % IJ SOLN
INTRAMUSCULAR | Status: DC | PRN
  Administered 2022-08-23: 19 mL

## 2022-08-23 MED ORDER — ONDANSETRON HCL 4 MG/2ML IJ SOLN
INTRAMUSCULAR | Status: AC
  Filled 2022-08-23: qty 2

## 2022-08-23 MED ORDER — METOPROLOL TARTRATE 25 MG PO TABS
25.0000 mg | ORAL_TABLET | Freq: Two times a day (BID) | ORAL | Status: DC
  Administered 2022-08-23 – 2022-08-29 (×12): 25 mg via ORAL
  Filled 2022-08-23 (×12): qty 1

## 2022-08-23 MED ORDER — HEPARIN BOLUS VIA INFUSION
1500.0000 [IU] | Freq: Once | INTRAVENOUS | Status: AC
  Administered 2022-08-23: 1500 [IU] via INTRAVENOUS
  Filled 2022-08-23: qty 1500

## 2022-08-23 MED ORDER — BUPIVACAINE HCL (PF) 0.5 % IJ SOLN
INTRAMUSCULAR | Status: AC
  Filled 2022-08-23: qty 30

## 2022-08-23 MED ORDER — HEPARIN (PORCINE) 25000 UT/250ML-% IV SOLN
2300.0000 [IU]/h | INTRAVENOUS | Status: DC
  Administered 2022-08-23: 2100 [IU]/h via INTRAVENOUS
  Filled 2022-08-23: qty 250

## 2022-08-23 MED ORDER — ACETAMINOPHEN 10 MG/ML IV SOLN
INTRAVENOUS | Status: AC
  Filled 2022-08-23: qty 100

## 2022-08-23 MED ORDER — MIDAZOLAM HCL 2 MG/2ML IJ SOLN
INTRAMUSCULAR | Status: AC
  Filled 2022-08-23: qty 2

## 2022-08-23 MED ORDER — PROPOFOL 10 MG/ML IV BOLUS
INTRAVENOUS | Status: AC
  Filled 2022-08-23: qty 20

## 2022-08-23 MED ORDER — BUPIVACAINE HCL 0.25 % IJ SOLN: INTRAMUSCULAR | Status: DC | PRN

## 2022-08-23 MED ORDER — FENTANYL CITRATE (PF) 100 MCG/2ML IJ SOLN: 25.0000 ug | INTRAMUSCULAR | Status: DC | PRN

## 2022-08-23 MED ORDER — METHYLENE BLUE 1 % INJ SOLN
INTRAVENOUS | Status: AC
  Filled 2022-08-23: qty 10

## 2022-08-23 MED ORDER — LIDOCAINE HCL (PF) 1 % IJ SOLN
INTRAMUSCULAR | Status: AC
  Filled 2022-08-23: qty 30

## 2022-08-23 MED ORDER — SODIUM CHLORIDE 0.9 % IR SOLN
Status: DC | PRN
  Administered 2022-08-23: 500 mL

## 2022-08-23 MED ORDER — FENTANYL CITRATE (PF) 100 MCG/2ML IJ SOLN
INTRAMUSCULAR | Status: AC
  Filled 2022-08-23: qty 2

## 2022-08-23 MED ORDER — PROPOFOL 1000 MG/100ML IV EMUL
INTRAVENOUS | Status: AC
  Filled 2022-08-23: qty 100

## 2022-08-23 MED ORDER — DEXAMETHASONE SODIUM PHOSPHATE 10 MG/ML IJ SOLN
INTRAMUSCULAR | Status: AC
  Filled 2022-08-23: qty 1

## 2022-08-23 MED ORDER — ROCURONIUM BROMIDE 10 MG/ML (PF) SYRINGE
PREFILLED_SYRINGE | INTRAVENOUS | Status: AC
  Filled 2022-08-23: qty 10

## 2022-08-23 MED ORDER — ACETAMINOPHEN 10 MG/ML IV SOLN
INTRAVENOUS | Status: DC | PRN
  Administered 2022-08-23: 1000 mg via INTRAVENOUS

## 2022-08-23 MED ORDER — 0.9 % SODIUM CHLORIDE (POUR BTL) OPTIME
TOPICAL | Status: DC | PRN
  Administered 2022-08-23: 1500 mL

## 2022-08-23 MED ORDER — FENTANYL CITRATE (PF) 100 MCG/2ML IJ SOLN
INTRAMUSCULAR | Status: DC | PRN
  Administered 2022-08-23: 25 ug via INTRAVENOUS
  Administered 2022-08-23: 50 ug via INTRAVENOUS
  Administered 2022-08-23: 25 ug via INTRAVENOUS

## 2022-08-23 MED ORDER — LIDOCAINE HCL (CARDIAC) PF 100 MG/5ML IV SOSY
PREFILLED_SYRINGE | INTRAVENOUS | Status: DC | PRN
  Administered 2022-08-23: 50 mg via INTRAVENOUS

## 2022-08-23 SURGICAL SUPPLY — 69 items
BLADE OSC/SAGITTAL MD 5.5X18 (BLADE) IMPLANT
BLADE OSCILLATING/SAGITTAL (BLADE)
BLADE SURG 15 STRL LF DISP TIS (BLADE) ×1 IMPLANT
BLADE SURG 15 STRL SS (BLADE) ×1
BLADE SW THK.38XMED LNG THN (BLADE) IMPLANT
BNDG CMPR 5X4 CHSV STRCH STRL (GAUZE/BANDAGES/DRESSINGS) ×1
BNDG CMPR 5X6 CHSV STRCH STRL (GAUZE/BANDAGES/DRESSINGS)
BNDG CMPR 75X21 PLY HI ABS (MISCELLANEOUS) ×1
BNDG COHESIVE 4X5 TAN STRL LF (GAUZE/BANDAGES/DRESSINGS) ×1 IMPLANT
BNDG COHESIVE 6X5 TAN ST LF (GAUZE/BANDAGES/DRESSINGS) ×1 IMPLANT
BNDG ELASTIC 4X5.8 VLCR STR LF (GAUZE/BANDAGES/DRESSINGS) ×1 IMPLANT
BNDG ESMARCH 4 X 12 STRL LF (GAUZE/BANDAGES/DRESSINGS)
BNDG ESMARCH 4X12 STRL LF (GAUZE/BANDAGES/DRESSINGS) ×1 IMPLANT
BNDG GAUZE DERMACEA FLUFF 4 (GAUZE/BANDAGES/DRESSINGS) ×1 IMPLANT
BNDG GZE 12X3 1 PLY HI ABS (GAUZE/BANDAGES/DRESSINGS) ×1
BNDG GZE DERMACEA 4 6PLY (GAUZE/BANDAGES/DRESSINGS)
BNDG STRETCH GAUZE 3IN X12FT (GAUZE/BANDAGES/DRESSINGS) ×1 IMPLANT
CANISTER WOUND CARE 500ML ATS (WOUND CARE) ×1 IMPLANT
CUFF TOURN SGL QUICK 12 (TOURNIQUET CUFF) IMPLANT
CUFF TOURN SGL QUICK 18X4 (TOURNIQUET CUFF) IMPLANT
DRAPE FLUOR MINI C-ARM 54X84 (DRAPES) IMPLANT
DRAPE XRAY CASSETTE 23X24 (DRAPES) IMPLANT
DRSG MEPILEX FLEX 3X3 (GAUZE/BANDAGES/DRESSINGS) IMPLANT
DURAPREP 26ML APPLICATOR (WOUND CARE) ×1 IMPLANT
ELECT REM PT RETURN 9FT ADLT (ELECTROSURGICAL) ×1
ELECTRODE REM PT RTRN 9FT ADLT (ELECTROSURGICAL) ×1 IMPLANT
GAUZE PACKING 0.25INX5YD STRL (GAUZE/BANDAGES/DRESSINGS) ×1 IMPLANT
GAUZE PACKING IODOFORM 2 (PACKING) IMPLANT
GAUZE SPONGE 4X4 12PLY STRL (GAUZE/BANDAGES/DRESSINGS) ×1 IMPLANT
GAUZE STRETCH 2X75IN STRL (MISCELLANEOUS) ×1 IMPLANT
GAUZE XEROFORM 1X8 LF (GAUZE/BANDAGES/DRESSINGS) ×1 IMPLANT
GLOVE BIO SURGEON STRL SZ7.5 (GLOVE) ×1 IMPLANT
GLOVE INDICATOR 8.0 STRL GRN (GLOVE) ×1 IMPLANT
GOWN STRL REUS W/ TWL XL LVL3 (GOWN DISPOSABLE) ×2 IMPLANT
GOWN STRL REUS W/TWL MED LVL3 (GOWN DISPOSABLE) ×1 IMPLANT
GOWN STRL REUS W/TWL XL LVL3 (GOWN DISPOSABLE) ×3
HANDPIECE VERSAJET DEBRIDEMENT (MISCELLANEOUS) IMPLANT
IV NS 1000ML (IV SOLUTION)
IV NS 1000ML BAXH (IV SOLUTION) ×1 IMPLANT
IV NS IRRIG 3000ML ARTHROMATIC (IV SOLUTION) ×1 IMPLANT
KIT DRSG VAC SLVR GRANUFM (MISCELLANEOUS) ×1 IMPLANT
KIT TURNOVER KIT A (KITS) ×1 IMPLANT
LABEL OR SOLS (LABEL) ×1 IMPLANT
MANIFOLD NEPTUNE II (INSTRUMENTS) ×1 IMPLANT
NDL FILTER BLUNT 18X1 1/2 (NEEDLE) ×1 IMPLANT
NDL HYPO 25X1 1.5 SAFETY (NEEDLE) ×1 IMPLANT
NEEDLE FILTER BLUNT 18X1 1/2 (NEEDLE) IMPLANT
NEEDLE HYPO 25X1 1.5 SAFETY (NEEDLE) ×1 IMPLANT
NS IRRIG 500ML POUR BTL (IV SOLUTION) ×1 IMPLANT
PACK EXTREMITY ARMC (MISCELLANEOUS) ×1 IMPLANT
PACKING GAUZE IODOFORM 1INX5YD (GAUZE/BANDAGES/DRESSINGS) ×1 IMPLANT
PAD ABD DERMACEA PRESS 5X9 (GAUZE/BANDAGES/DRESSINGS) ×1 IMPLANT
PULSAVAC PLUS IRRIG FAN TIP (DISPOSABLE)
RASP SM TEAR CROSS CUT (RASP) IMPLANT
SHIELD FULL FACE ANTIFOG 7M (MISCELLANEOUS) ×1 IMPLANT
STOCKINETTE IMPERVIOUS 9X36 MD (GAUZE/BANDAGES/DRESSINGS) ×1 IMPLANT
SUT ETHILON 2 0 FS 18 (SUTURE) ×2 IMPLANT
SUT ETHILON 4-0 (SUTURE)
SUT ETHILON 4-0 FS2 18XMFL BLK (SUTURE)
SUT VIC AB 3-0 SH 27 (SUTURE)
SUT VIC AB 3-0 SH 27X BRD (SUTURE) ×1 IMPLANT
SUT VIC AB 4-0 FS2 27 (SUTURE) ×1 IMPLANT
SUTURE ETHLN 4-0 FS2 18XMF BLK (SUTURE) ×1 IMPLANT
SWAB CULTURE AMIES ANAERIB BLU (MISCELLANEOUS) IMPLANT
SYR 10ML LL (SYRINGE) ×1 IMPLANT
SYR 3ML LL SCALE MARK (SYRINGE) ×1 IMPLANT
TIP FAN IRRIG PULSAVAC PLUS (DISPOSABLE) ×1 IMPLANT
TRAP FLUID SMOKE EVACUATOR (MISCELLANEOUS) ×1 IMPLANT
WATER STERILE IRR 500ML POUR (IV SOLUTION) ×1 IMPLANT

## 2022-08-23 NOTE — Progress Notes (Signed)
Physical Therapy Treatment Patient Details Name: Andrew Harding MRN: [id number] DOB: November 18, 1950 Today's Date: 08/23/2022   History of Present Illness Andrew Harding is a 72 year old male with recent toe amputations on left foot. Found to have worsening infection with gangrene and osteomyelitis. 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 History of diabetes mellitus.    PT Comments    Pt in bed on entry agreeable to session. Pt is NPO again, pretty hungry, we try to keep session limited, albeit he remains very determined. Pt dons/doffs footwear today without assist, author manages bilat IV lines during task. Pt still requires very elevated EOB for rising to standing, balance continues to be limited. Pt able to tolerate 30sec standing and then later 60ft hopping, but dizziness gets pretty bad, HR in 130s. Will resume services after procedure today.      Recommendations for follow up therapy are one component of a multi-disciplinary discharge planning process, led by the attending physician.  Recommendations may be updated based on patient status, additional functional criteria and insurance authorization.  Follow Up Recommendations       Assistance Recommended at Discharge Intermittent Supervision/Assistance  Patient can return home with the following A little help with walking and/or transfers;A little help with bathing/dressing/bathroom;Assistance with cooking/housework;Assist for transportation;Help with stairs or ramp for entrance   Equipment Recommendations       Recommendations for Other Services       Precautions / Restrictions Precautions Precautions: Fall Restrictions Weight Bearing Restrictions: Yes LLE Weight Bearing: Non weight bearing Other Position/Activity Restrictions: post-op shoe     Mobility  Bed Mobility Overal bed mobility: Modified Independent Bed Mobility: Supine to Sit, Sit to Supine                Transfers Overall transfer level: Needs  assistance Equipment used: Rolling walker (2 wheels) Transfers: Sit to/from Stand Sit to Stand: Min guard, From elevated surface                Ambulation/Gait   Gait Distance (Feet): 7 Feet Assistive device: Rolling walker (2 wheels)         General Gait Details: fwd and backward hop-step at bedside;, maintained NWB but needs minGuard due to precarious balance.   Stairs             Wheelchair Mobility    Modified Rankin (Stroke Patients Only)       Balance                                            Cognition Arousal/Alertness: Awake/alert Behavior During Therapy: WFL for tasks assessed/performed Overall Cognitive Status: Within Functional Limits for tasks assessed                                          Exercises Other Exercises Other Exercises: 5xSTS from eelvated EOB, maintains NWB LLE Other Exercises: fwd/backward hop stepping 53ft at EOB (pretty strong dizziness thereafter, HR in 130s)    General Comments        Pertinent Vitals/Pain Pain Assessment Pain Assessment: No/denies pain    Home Living                          Prior Function  PT Goals (current goals can now be found in the care plan section) Acute Rehab PT Goals Patient Stated Goal: to go home PT Goal Formulation: With patient Time For Goal Achievement: 09/03/22 Potential to Achieve Goals: Poor Progress towards PT goals: Progressing toward goals    Frequency    Min 4X/week      PT Plan Current plan remains appropriate    Co-evaluation              AM-PAC PT "6 Clicks" Mobility   Outcome Measure  Help needed turning from your back to your side while in a flat bed without using bedrails?: None Help needed moving from lying on your back to sitting on the side of a flat bed without using bedrails?: None Help needed moving to and from a bed to a chair (including a wheelchair)?: A Lot Help needed  standing up from a chair using your arms (e.g., wheelchair or bedside chair)?: A Lot Help needed to walk in hospital room?: A Lot Help needed climbing 3-5 steps with a railing? : Total 6 Click Score: 15    End of Session   Activity Tolerance: Patient limited by fatigue;Patient tolerated treatment well Patient left: in bed;with call bell/phone within reach;with family/visitor present Nurse Communication: Mobility status PT Visit Diagnosis: Muscle weakness (generalized) (M62.81);Difficulty in walking, not elsewhere classified (R26.2)     Time: 0981-1914 PT Time Calculation (min) (ACUTE ONLY): 27 min  Charges:  $Therapeutic Activity: 23-37 mins                    11:51 AM, 08/23/22 Rosamaria Lints, PT, DPT Physical Therapist - [facility]  [phone] (ASCOM)    Jamin Humphries C 08/23/2022, 11:47 AM

## 2022-08-23 NOTE — Progress Notes (Signed)
ANTICOAGULATION CONSULT NOTE  Pharmacy Consult for heparin infusion Indication: atrial fibrillation  Patient Measurements: Height:  (190.5 cm) Weight: 113.4 kg (250 lb) IBW/kg (Calculated) : 84.5 Heparin Dosing Weight: 108 kg  Labs: Recent Labs    08/21/22 0517 08/21/22 0900 08/21/22 2244 08/22/22 0716 08/23/22 0210  HGB  --  9.2*  --  8.5* 8.8*  HCT  --  27.8*  --  25.9* 27.6*  PLT  --  331  --  302 356  HEPARINUNFRC  --   --  0.23* 0.37 0.29*  CREATININE 1.36*  --   --  1.26* 1.27*    Estimated Creatinine Clearance: 70.4 mL/min (A) (by C-G formula based on SCr of 1.27 mg/dL (H)).  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 male w/ PMH of DM, periph neuropathy, HTN, HLD, CAD-CABG, CKD admitted for cellulitis now with new onset atrial fibrillation. He is on no chronic anticoagulation.  Goal of Therapy:  Heparin level 0.3-0.7 units/ml Monitor platelets by anticoagulation protocol: Yes   Plan:  4/24:  HL @ 0210 = 0.29, SUBtherapeutic  - Will order heparin 1500 units IV  X  1 bolus and increase drip rate to 2100 units/hr. - Will recheck HL 8 hrs after rate change  Martez Weiand D, PharmD 08/23/2022,2:43 AM

## 2022-08-23 NOTE — Progress Notes (Signed)
Inpatient Rehab Admissions Coordinator:    I spoke with Pt. Regarding potential CIR admit vs HH. Pt. States that he is hoping to progress and go home, but is open to CIR if he does not. Wants to see how he does after surgery today.  He also states that while he was living alone, he has a wife and that she and other friends and family are potentially able to provide 24/7 support at d/c. I will see how he does post-op and if Pt. Wishes to pursue CIR, I will confirm the details of that plan.   Megan Salon, MS, CCC-SLP Rehab Admissions Coordinator  [phone] (celll) [phone] (office)

## 2022-08-23 NOTE — Transfer of Care (Signed)
Immediate Anesthesia Transfer of Care Note  [name]  Procedure(s) Performed: INCISION AND DRAINAGE (Left: Foot)  Patient Location: PACU  Anesthesia Type:MAC  Level of Consciousness: awake, alert , oriented, and patient cooperative  Airway & Oxygen Therapy: Patient Spontanous Breathing  Post-op Assessment: Report given to RN and Post -op Vital signs reviewed and stable  Post vital signs: Reviewed and stable  Last Vitals:  Vitals Value Taken Time  BP 119/65 08/23/22 1532  Temp 36.2 C 08/23/22 1532  Pulse 95 08/23/22 1535  Resp 16 08/23/22 1535  SpO2 99 % 08/23/22 1535  Vitals shown include unvalidated device data.  Last Pain:  Vitals:   08/23/22 1303  TempSrc: Oral  PainSc:          Complications: No notable events documented.

## 2022-08-23 NOTE — Progress Notes (Signed)
[facility] CLINIC CARDIOLOGY CONSULT NOTE       Patient ID: REINHARD SCHACK MRN: [id number] DOB/AGE: November 13, 1950 72 y.o.  Admit date: 08/17/2022 Referring Physician Dr. Enedina Finner Primary Physician Dr. Daniel Nones  Primary Cardiologist previous Dr. Gwen Pounds  Reason for Consultation new AF  HPI: Andrew Harding. Reasoner is a 72yoM with a PMH of CAD s/p CABG x 4 (2014, [facility]), PAD, DM2, who presented to [facility] ED 08/17/2022 from his podiatrist office worsening swelling and erythema to his left foot.  He is now s/p Left TMA x 3 on 4/20 with podiatry with ongoing treatment for cellulitis and wound necrosis. Vascular surgery planning LE angiogram on 4/23. Cardiology is following for new onset paroxysmal AF discovered on admission EKG.   Interval History:  - s/p Left LE angiogram + angioplasty with vascular surgery yesterday PM - feels ok today, NPO for debridement with podiatry  - no chest pain, shortness of breath, palpitations, dizziness or presyncope - remains in AF on tele, rate primarily in the 60-70s, briefly elevated while working with PT/OT to the 130s  Review of systems complete and found to be negative unless listed above     Past Medical History:  Diagnosis Date   Bladder neck obstruction    Chronic kidney disease    Coronary artery disease    a.) s/p 4v CABG in 2014   Diabetes mellitus without complication    Diabetic neuropathy    Diabetic peripheral neuropathy    Diverticulosis    Gout    Heart murmur    Hypercholesteremia    Hyperlipidemia    Hypertension    Peripheral neuropathy    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   Tubular adenoma    Vitamin D deficiency     Past Surgical History:  Procedure Laterality Date   AMPUTATION Left 08/19/2022   Procedure: TRANSMETATARSAL AMPUTATION LEFT FOOT WITH IRRIGATION AND DEBRIDEMENT;  Surgeon: Rosetta Posner, DPM;  Location: [facility] ORS;  Service: Podiatry;  Laterality: Left;   AMPUTATION TOE Right 06/01/2015   Procedure: AMPUTATION TOE;  Surgeon: Recardo Evangelist,  DPM;  Location: [facility] ORS;  Service: Podiatry;  Laterality: Right;   AMPUTATION TOE Left 08/11/2022   Procedure: AMPUTATION TOE 2, 3, 4;  Surgeon: [name], DPM;  Location: [facility] ORS;  Service: Podiatry;  Laterality: Left;   CATARACT EXTRACTION, BILATERAL     CIRCUMCISION N/A 06/12/2022   Procedure: CIRCUMCISION ADULT;  Surgeon: Vanna Scotland, MD;  Location: [facility] ORS;  Service: Urology;  Laterality: N/A;   COLONOSCOPY WITH PROPOFOL N/A 02/14/2016   Procedure: COLONOSCOPY WITH PROPOFOL;  Surgeon: Christena Deem, MD;  Location: [facility] ENDOSCOPY;  Service: Endoscopy;  Laterality: N/A;   COLONOSCOPY WITH PROPOFOL N/A 01/07/2019   Procedure: COLONOSCOPY WITH PROPOFOL;  Surgeon: Christena Deem, MD;  Location: [facility] ENDOSCOPY;  Service: Endoscopy;  Laterality: N/A;   CORONARY ARTERY BYPASS GRAFT N/A 08/2012   EXCISION PARTIAL PHALANX Right 06/01/2015   Procedure: EXCISION PARTIAL PHALANX /  BONE;  Surgeon: Recardo Evangelist, DPM;  Location: [facility] ORS;  Service: Podiatry;  Laterality: Right;   FLEXIBLE SIGMOIDOSCOPY N/A 05/29/2016   Procedure: FLEXIBLE SIGMOIDOSCOPY;  Surgeon: Christena Deem, MD;  Location: [facility] ENDOSCOPY;  Service: Endoscopy;  Laterality: N/A;   KNEE ARTHROSCOPY Left    LOWER EXTREMITY ANGIOGRAPHY Left 08/22/2022   Procedure: Lower Extremity Angiography;  Surgeon: Renford Dills, MD;  Location: [facility] INVASIVE CV LAB;  Service: Cardiovascular;  Laterality: Left;    Medications Prior to Admission  Medication Sig  Dispense Refill Last Dose   aspirin EC 81 MG tablet Take 81 mg by mouth daily.   08/17/2022   betamethasone dipropionate 0.05 % cream Apply topically 2 (two) times daily. 30 g 0 Past Week   [EXPIRED] cefdinir (OMNICEF) 300 MG capsule Take 300 mg by mouth 2 (two) times daily.   08/17/2022   cholecalciferol (VITAMIN D) 1000 units tablet Take 2,000 Units by mouth daily.   08/17/2022   gabapentin (NEURONTIN) 300 MG capsule Take 300 mg by mouth 3 (three) times daily.    08/17/2022   insulin glargine (LANTUS) 100 UNIT/ML injection Inject 52 Units into the skin at bedtime.   08/16/2022   lisinopril (ZESTRIL) 20 MG tablet Take 20 mg by mouth daily.   08/17/2022   metFORMIN (GLUCOPHAGE-XR) 500 MG 24 hr tablet 500 mg 2 (two) times daily with a meal.   08/17/2022   NOVOLOG FLEXPEN 100 UNIT/ML FlexPen Inject 15-20 Units into the skin 3 (three) times daily with meals.   08/17/2022   pravastatin (PRAVACHOL) 80 MG tablet TAKE ONE TABLET BY MOUTH AT NIGHT   08/16/2022   silver sulfADIAZINE (SILVADENE) 1 % cream Apply topically.   Past Week   traZODone (DESYREL) 50 MG tablet TAKE ONE TABLET AT BEDTIME AS NEEDED   08/16/2022   TRULICITY 4.5 MG/0.5ML SOPN Inject into the skin.   08/13/2022   glucose blood (ONETOUCH ULTRA) test strip Use 3 (three) times daily      Insulin Pen Needle (FIFTY50 PEN NEEDLES) 32G X 4 MM MISC USE 4 TIMES DAILY      Social History   Socioeconomic History   Marital status: Married    Spouse name: Ashley,Angela C   Number of children: Not on file   Years of education: Not on file   Highest education level: Not on file  Occupational History   Not on file  Tobacco Use   Smoking status: Some Days    Types: Cigars   Smokeless tobacco: Never  Vaping Use   Vaping Use: Never used  Substance and Sexual Activity   Alcohol use: No   Drug use: No   Sexual activity: Never  Other Topics Concern   Not on file  Social History Narrative   Lives at home by himself. Independent at baseline   Social Determinants of Health   Financial Resource Strain: Not on file  Food Insecurity: No Food Insecurity (08/19/2022)   Hunger Vital Sign    Worried About Running Out of Food in the Last Year: Never true    Ran Out of Food in the Last Year: Never true  Transportation Needs: No Transportation Needs (08/19/2022)   PRAPARE - Administrator, Civil Service (Medical): No    Lack of Transportation (Non-Medical): No  Physical Activity: Not on file  Stress:  Not on file  Social Connections: Not on file  Intimate Partner Violence: Not At Risk (08/19/2022)   Humiliation, Afraid, Rape, and Kick questionnaire    Fear of Current or Ex-Partner: No    Emotionally Abused: No    Physically Abused: No    Sexually Abused: No    Family History  Problem Relation Age of Onset   Diabetes Mellitus II Mother    CAD Mother       Intake/Output Summary (Last 24 hours) at 08/23/2022 1119 Last data filed at 08/23/2022 0815 Gross per 24 hour  Intake 551.78 ml  Output 1000 ml  Net -448.22 ml     Vitals:  08/22/22 2000 08/22/22 2005 08/23/22 0400 08/23/22 0822  BP: (!) 146/70  109/65 132/63  Pulse: (!) 111 98 72 73  Resp: Temp: 98 F (36.7 C)  98.1 F (36.7 C) 98 F (36.7 C)  TempSrc: Oral  Oral   SpO2: 99% 99% 98% 98%  Weight:      Height:        PHYSICAL EXAM General: Pleasant Caucasian male , well nourished, in no acute distress. Sitting in bed, no family present HEENT:  Normocephalic and atraumatic. Neck:  No JVD.  Lungs: Normal respiratory effort on room air. Clear bilaterally to auscultation. No wheezes, crackles, rhonchi.  Heart: irregular irregular with controlled rate  . Normal S1 and S2 without gallops or murmurs.  Abdomen: Non-distended appearing.  Msk: Normal strength and tone for age. Extremities: L foot with ace bandage in place, trace edema to the left. R groin with clean gauze and tegaderm over arteriotomy, no significant tenderness to palpation, ecchymosis, or apparent aneurysm Neuro: Alert and oriented X 3. Psych:  Answers questions appropriately.   Labs: Basic Metabolic Panel: Recent Labs    08/21/22 0515 08/21/22 0517 08/22/22 0716 08/23/22 0210  NA  --   --  135 133*  K  --   --  3.9 4.2  CL  --   --  104 103  CO2  --   --  23 22  GLUCOSE  --   --  113* 242*  BUN  --   --  20 21  CREATININE  --    < > 1.26* 1.27*  CALCIUM  --   --  8.2* 8.1*  MG 1.7  --   --   --    < > = values in this interval  not displayed.    Liver Function Tests: Recent Labs    08/22/22 0716 08/23/22 0210  AST 157* 98*  ALT 119* 99*  ALKPHOS 162* 183*  BILITOT 0.6 0.7  PROT 6.3* 6.3*  ALBUMIN 2.2* 2.2*    No results for input(s): "LIPASE", "AMYLASE" in the last 72 hours. CBC: Recent Labs    08/22/22 0716 08/23/22 0210  WBC 10.6* 9.4  HGB 8.5* 8.8*  HCT 25.9* 27.6*  MCV 92.8 94.5  PLT 302 356    Cardiac Enzymes: No results for input(s): "CKTOTAL", "CKMB", "CKMBINDEX", "TROPONINIHS" in the last 72 hours. BNP: No results for input(s): "BNP" in the last 72 hours. D-Dimer: No results for input(s): "DDIMER" in the last 72 hours. Hemoglobin A1C: No results for input(s): "HGBA1C" in the last 72 hours. Fasting Lipid Panel: No results for input(s): "CHOL", "HDL", "LDLCALC", "TRIG", "CHOLHDL", "LDLDIRECT" in the last 72 hours. Thyroid Function Tests: No results for input(s): "TSH", "T4TOTAL", "T3FREE", "THYROIDAB" in the last 72 hours.  Invalid input(s): "FREET3" Anemia Panel: No results for input(s): "VITAMINB12", "FOLATE", "FERRITIN", "TIBC", "IRON", "RETICCTPCT" in the last 72 hours.   Radiology: ECHOCARDIOGRAM COMPLETE  Result Date: 08/23/2022    ECHOCARDIOGRAM REPORT   Patient Name:   JADON HARBAUGH Date of Exam: 08/21/2022 Medical Rec #:  [id number]   Height:       75.0 in Accession #:    [id number]  Weight:       250.0 lb Date of Birth:  1951/04/12    BSA:          2.413 m Patient Age:    72 years    BP:           132/64  mmHg Patient Gender: M           HR:           87 bpm. Exam Location:  [facility] Procedure: 2D Echo, Cardiac Doppler and Color Doppler Indications:     Atrial Fibrillation I48.91  History:         Patient has no prior history of Echocardiogram examinations.                  Prior CABG; Risk Factors:Hypertension, Dyslipidemia and                  Diabetes.  Sonographer:     Cristela Blue Referring Phys:  [id number] Ralphine Hinks MICHELLE Othar Curto Diagnosing Phys: Alwyn Pea MD IMPRESSIONS  1. Left  ventricular ejection fraction, by estimation, is 65 to 70%. The left ventricle has normal function. The left ventricle demonstrates regional wall motion abnormalities (see scoring diagram/findings for description). Left ventricular diastolic parameters were normal.  2. Right ventricular systolic function is normal. The right ventricular size is normal.  3. The mitral valve is normal in structure. No evidence of mitral valve regurgitation.  4. The aortic valve is normal in structure. Aortic valve regurgitation is not visualized. FINDINGS  Left Ventricle: Left ventricular ejection fraction, by estimation, is 65 to 70%. The left ventricle has normal function. The left ventricle demonstrates regional wall motion abnormalities. The left ventricular internal cavity size was normal in size. There is no left ventricular hypertrophy. Left ventricular diastolic parameters were normal. Right Ventricle: The right ventricular size is normal. No increase in right ventricular wall thickness. Right ventricular systolic function is normal. Left Atrium: Left atrial size was normal in size. Right Atrium: Right atrial size was normal in size. Pericardium: There is no evidence of pericardial effusion. Mitral Valve: The mitral valve is normal in structure. No evidence of mitral valve regurgitation. MV peak gradient, 6.5 mmHg. The mean mitral valve gradient is 3.0 mmHg. Tricuspid Valve: The tricuspid valve is normal in structure. Tricuspid valve regurgitation is trivial. Aortic Valve: The aortic valve is normal in structure. Aortic valve regurgitation is not visualized. Aortic valve mean gradient measures 2.0 mmHg. Aortic valve peak gradient measures 3.8 mmHg. Aortic valve area, by VTI measures 3.63 cm. Pulmonic Valve: The pulmonic valve was grossly normal. Pulmonic valve regurgitation is not visualized. Aorta: The ascending aorta was not well visualized. IAS/Shunts: No atrial level shunt detected by color flow Doppler.  LEFT VENTRICLE  PLAX 2D LVIDd:         4.40 cm LVIDs:         2.70 cm LV PW:         1.00 cm LV IVS:        1.10 cm LVOT diam:     2.20 cm LV SV:         56 LV SV Index:   23 LVOT Area:     3.80 cm  RIGHT VENTRICLE RV Basal diam:  3.30 cm RV Mid diam:    3.70 cm RV S prime:     12.20 cm/s TAPSE (M-mode): 1.4 cm LEFT ATRIUM             Index        RIGHT ATRIUM           Index LA diam:        4.30 cm 1.78 cm/m   RA Area:     19.80 cm LA Vol (A2C):   80.0 ml 33.16 ml/m  RA Volume:   57.90 ml  24.00 ml/m LA Vol (A4C):   80.8 ml 33.49 ml/m LA Biplane Vol: 83.0 ml 34.40 ml/m  AORTIC VALVE AV Area (Vmax):    3.11 cm AV Area (Vmean):   3.41 cm AV Area (VTI):     3.63 cm AV Vmax:           97.45 cm/s AV Vmean:          70.150 cm/s AV VTI:            0.154 m AV Peak Grad:      3.8 mmHg AV Mean Grad:      2.0 mmHg LVOT Vmax:         79.80 cm/s LVOT Vmean:        63.000 cm/s LVOT VTI:          0.147 m LVOT/AV VTI ratio: 0.95  AORTA Ao Root diam: 3.50 cm MITRAL VALVE                TRICUSPID VALVE MV Area (PHT): 4.63 cm     TR Peak grad:   25.0 mmHg MV Area VTI:   3.12 cm     TR Vmax:        250.00 cm/s MV Peak grad:  6.5 mmHg MV Mean grad:  3.0 mmHg     SHUNTS MV Vmax:       1.27 m/s     Systemic VTI:  0.15 m MV Vmean:      71.7 cm/s    Systemic Diam: 2.20 cm MV Decel Time: 164 msec MV E velocity: 127.00 cm/s Alwyn Pea MD Electronically signed by Alwyn Pea MD Signature Date/Time: 08/23/2022/7:30:34 AM    Final    PERIPHERAL VASCULAR CATHETERIZATION  Result Date: 08/22/2022 See surgical note for result.  DG Foot Complete Left  Result Date: 08/19/2022 CLINICAL DATA:  Postop. EXAM: LEFT FOOT - COMPLETE 3+ VIEW COMPARISON:  Preoperative exam earlier today FINDINGS: Interval transmetatarsal amputation of all 5 rays. Presumed antibiotic beads projecting over the mid metatarsals as well as the ankle at the level of the medial malleolus. Overlying skin staples in place. IMPRESSION: Interval transmetatarsal  amputation of all 5 rays. Presumed antibiotic beads projecting over the mid metatarsals and medial ankle. Electronically Signed   By: Narda Rutherford M.D.   On: 08/19/2022 16:48   DG Foot 2 Views Left  Result Date: 08/19/2022 CLINICAL DATA:  Infection, patient reports recent surgery. EXAM: LEFT FOOT - 2 VIEW COMPARISON:  Foot MRI yesterday FINDINGS: Prior amputation of the second and third toes as well as fourth toe at the proximal aspect of the proximal phalanx. Focal defect involving the residual fourth toe proximal phalanx as seen on prior MRI. No definite radiographic correlate to the edematous changes in the second and third metatarsal heads on MRI. No evidence of acute fracture. Soft tissue edema is seen over the dorsum of the forefoot. Vascular calcifications. IMPRESSION: 1. Prior amputation of the second and third toes as well as fourth toe at the proximal phalanx. Small defect in the fourth toe proximal phalanx corresponding to MRI findings. No radiographic correlate to the marrow edema changes in the second and third metatarsal heads on MRI. 2. Forefoot soft tissue edema. Electronically Signed   By: Narda Rutherford M.D.   On: 08/19/2022 16:45   MR FOOT LEFT WO CONTRAST  Result Date: 08/18/2022 CLINICAL DATA:  Foot swelling, diabetic, osteomyelitis suspected, xray done EXAM: MRI OF THE  LEFT FOOT WITHOUT CONTRAST TECHNIQUE: Multiplanar, multisequence MR imaging of the left forefoot was performed. No intravenous contrast was administered. COMPARISON:  None Available. FINDINGS: Bones/Joint/Cartilage Postsurgical changes of amputation of the second and third toes at the metatarsophalangeal joint level. Prior fourth toe amputation through the base of the proximal phalanx. Surgical defect or focal erosion involving the distal aspect of the residual fourth toe proximal phalanx (series 9, image 14). There is periosteal edema of the second and third metatarsal heads with relatively mild bone marrow edema.  Prominent bone marrow edema within the first and third metatarsal diaphyses and at the proximal metaphysis of the second metatarsal. No well-defined fracture line. Normal alignment. Relatively mild degenerative changes. Ligaments Intact Lisfranc ligament. Remaining collateral ligaments are intact. Muscles and Tendons Chronic denervation changes of the foot musculature. Amputation changes of the musculotendinous structures of the second through fourth toes. No tenosynovitis. Soft tissues Soft tissue wound or ulceration at the dorsal aspect of the forefoot overlying the second metatarsal head. Additional shallow wounds or ulcerations dorsal to the third and fourth metatarsal heads. Extensive soft tissue edema. No organized or drainable fluid collections. IMPRESSION: 1. Postsurgical changes of amputation of the second through fourth toes. Soft tissue wounds or ulcerations at the dorsal aspect of the forefoot overlying the second metatarsal head. Additional shallow wounds or ulcerations dorsal to the third and fourth metatarsal heads. 2. Small postsurgical defect or focal erosion involving the distal aspect of the residual fourth toe proximal phalanx. Appearance is suspicious for osteomyelitis. 3. Periosteal edema of the second and third metatarsal heads with relatively mild bone marrow edema. Findings are suspicious for early acute osteomyelitis. 4. Prominent bone marrow edema within the first and third metatarsal diaphyses and at the proximal metaphysis of the second metatarsal. No well-defined fracture line. Findings are favored to represent stress-related changes. 5. Extensive soft tissue edema. No organized or drainable fluid collections. Electronically Signed   By: Duanne Guess D.O.   On: 08/18/2022 20:24   CT Angio Chest PE W and/or Wo Contrast  Result Date: 08/17/2022 CLINICAL DATA:  Sepsis. EXAM: CT ANGIOGRAPHY CHEST WITH CONTRAST TECHNIQUE: Multidetector CT imaging of the chest was performed using the  standard protocol during bolus administration of intravenous contrast. Multiplanar CT image reconstructions and MIPs were obtained to evaluate the vascular anatomy. RADIATION DOSE REDUCTION: This exam was performed according to the departmental dose-optimization program which includes automated exposure control, adjustment of the mA and/or kV according to patient size and/or use of iterative reconstruction technique. CONTRAST:  60mL OMNIPAQUE IOHEXOL 350 MG/ML SOLN COMPARISON:  Chest x-ray 08/17/2022 earlier FINDINGS: Cardiovascular: No pulmonary embolism identified. Status post median sternotomy. Heart is nonenlarged. No significant pericardial effusion. The thoracic aorta has a normal course and caliber with mild atherosclerotic calcified plaque. Coronary artery calcifications are noted. 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 Mediastinum/Nodes: Mildly patulous esophagus. No specific abnormal lymph node enlargement seen in the axillary region, hilum or mediastinum. Lungs/Pleura: Mild diffuse breathing motion. No consolidation, pneumothorax or effusion. Dependent atelectasis. 3 mm nodule right upper lobe on series 5, image 77 is noncalcified. Similar focus on image 56 but this has a component of calcification peripherally. Upper Abdomen: Gallstones in the nondilated gallbladder. There is dystrophic calcification along the left adrenal gland. Please correlate with any previous infectious, inflammatory or traumatic process. This was seen on the CT scan of 2014 of the abdomen and pelvis. Musculoskeletal: Mild degenerative changes along the spine. Review of the MIP images confirms  the above findings. IMPRESSION: No pulmonary embolism identified.  Mild breathing motion. Postop chest. Gallstones. 3 mm lung nodules. No follow-up needed if patient is low-risk (and has no known or suspected primary neoplasm). Non-contrast chest CT can be considered in 12 months if patient is high-risk. This recommendation  follows the consensus statement: Guidelines for Management of Incidental Pulmonary Nodules Detected on CT Images: From the Fleischner Society 2017; Radiology 2017; 284:228-243. Aortic Atherosclerosis (ICD10-I70.0). Electronically Signed   By: Karen Kays M.D.   On: 08/17/2022 13:43   CT HEAD WO CONTRAST ( )  Result Date: 08/17/2022 CLINICAL DATA:  Possible wound infection to left foot EXAM: CT HEAD WITHOUT CONTRAST TECHNIQUE: Contiguous axial images were obtained from the base of the skull through the vertex without intravenous contrast. RADIATION DOSE REDUCTION: This exam was performed according to the departmental dose-optimization program which includes automated exposure control, adjustment of the mA and/or kV according to patient size and/or use of iterative reconstruction technique. COMPARISON:  None Available. FINDINGS: Brain: No evidence of acute infarction, hemorrhage, mass, mass effect, or midline shift. No hydrocephalus or extra-axial fluid collection. Vascular: No hyperdense vessel. Atherosclerotic calcifications in the intracranial carotid and vertebral arteries. Skull: Negative for fracture or focal lesion. Sinuses/Orbits: Mucosal thickening in the ethmoid air cells and left maxillary sinus. No acute finding in the orbits. Status post bilateral lens replacements. Other: The mastoid air cells are well aerated. IMPRESSION: No acute intracranial process. Electronically Signed   By: Wiliam Ke M.D.   On: 08/17/2022 13:35   DG Chest 2 View  Result Date: 08/17/2022 CLINICAL DATA:  Sepsis EXAM: CHEST - 2 VIEW COMPARISON:  CXR 07/19/12 FINDINGS: Status median sternotomy and CABG. No pleural effusion. No pneumothorax. Normal cardiac and mediastinal contours. No focal airspace opacity. No radiographically apparent displaced rib fractures. Vertebral body heights are visualized upper abdomen is unremarkable IMPRESSION: No focal airspace opacity Electronically Signed   By: Lorenza Cambridge M.D.   On:  08/17/2022 10:20   DG MINI C-ARM IMAGE ONLY  Result Date: 08/11/2022 There is no interpretation for this exam.  This order is for images obtained during a surgical procedure.  Please See "Surgeries" Tab for more information regarding the procedure.    ECHO 09/19/2012 INTERPRETATION ---------------------------------------------------------------   NORMAL LEFT VENTRICULAR SYSTOLIC FUNCTION   NORMAL RIGHT VENTRICULAR SYSTOLIC FUNCTION   VALVULAR REGURGITATION: TRIVIAL TR   NO VALVULAR STENOSIS   DEFINITY IMAGING AGENT USED   NO PRIOR FOR COMPARISON   (Report version 3.0)                    Interpreted and Electronically signed  Perform. by: Sarina Ill, RDCS               by: Chip Boer, MD  Resp.Person: Sarina Ill, RDCS               On: 09/19/2012 11:58:55  Resp. Staff: Hollice Espy, RN Exam End: 09/19/12 09:57    TELEMETRY reviewed by me (LT) 08/23/2022 : AF rate 60s-70s with brief elevation to 130s-140s while up working with PT/OT    EKG reviewed by me: AF 93bpm   Data reviewed by me (LT) 08/23/2022: hospitalist progress note, vascular surgery note, ID note, PT/OT notes, last 24h vitals tele labs imaging I/O    Principal Problem:   Sepsis Active Problems:   Type 2 diabetes mellitus with stage 3b chronic kidney disease, with long-term current use of insulin   Cellulitis  Hypotension due to hypovolemia   Infection of left foot   Atrial fibrillation    ASSESSMENT AND PLAN:  Andrew Harding. Thon is a 72yoM with a PMH of CAD s/p CABG x 4 (2014, [facility]), PAD, DM2, who presented to [facility] ED 08/17/2022 from his podiatrist office worsening swelling and erythema to his left foot.  He is now s/p Left TMA x 3 on 4/20 with podiatry with ongoing treatment for cellulitis and wound necrosis. Vascular surgery planning LE angiogram on 4/23. Cardiology is following for new onset paroxysmal AF discovered on admission EKG.   # sepsis 2/2 L foot cellulitis and wound necrosis  # s/p  Left TMA x 3 with I&D to left foot and ankle on 4/20  -Agree with current therapy per primary team, podiatry, and vascular surgery -Vascular planning for LE angiogram on today  # new onset paroxysmal AF  Discovered on initial EKG, new in onset. Possibly provoked in the setting of sepsis/infection. Remains predominantly rate controlled on tele today in the 60s-70s asymptomatic. -Continuous monitoring on telemetry while inpatient -increase metoprolol tartrate to 25mg  twice daily for rate control -continue heparin infusion per pharmacy protocol for anticoagulation, likely discharge on a DOAC (eliquis 5mg  BID) + single antiplatelet therapy (plavix) based on angiogram findings. CHADSVASC 4 (age, HTN, CAD/PAD, DM2) -TSH within normal limits -Echo complete with normal biventricular function, EF 65-70% - discussed rate vs rhythm control strategy with patient & his wife. Will consider rhythm control with amiodarone (if transaminitis resolves) vs sotalol (per Dr. Juliann Pares) vs continuing rate control only pending clinical course. -Consider DCCV outpatient after 4 weeks of appropriate anticoagulation.  - will arrange for follow up with MD/APP at [facility] at discharge   # transaminitis LFTs downtrending -- AST ALT 157/119 to 98/99 respectively today - hepatitis panel pending - holding statin for now  # CAD s/p CABG x 4 without chest pain - agree with plavix monotherapy + eliquis 5mg  BID at discharge  - hold pravastatin 80 daily with transaminitis  - hold lisinopril 20 mg for now, likely restart at discharge  This patient's plan of care was discussed and created with Dr. Juliann Pares and he is in agreement.  Rebeca Allegra , PA-C 08/23/22, 1:45pm  [facility]

## 2022-08-23 NOTE — Progress Notes (Signed)
  Progress Note    08/23/2022 11:56 AM 1 Day Post-Op  Subjective:  72 year old with history of poorly controlled insulin-dependent DM 2, peripheral neuropathy, HLD, HTN, CAD status post CABG sent to the hospital from Dr. Irene Limbo office due to worsening left foot swelling and erythema. Now S/P POD #1 from left lower extremity angiogram with angioplasty. Patient endorses foot numbness has decreased and he can feel his foot this morning. Endorses pain on palpation. No other complaints and vitals all remain stable.    Vitals:   08/23/22 0400 08/23/22 0822  BP: 109/65 132/63  Pulse: 72 73  Resp: 12 18  Temp: 98.1 F (36.7 C) 98 F (36.7 C)  SpO2: 98% 98%   Physical Exam: Cardiac:  Afib, Irregular rate.  Lungs:  Clear on auscultation throughout. No rales, rhonchi or wheezing.  Incisions:  Right groin with dressing clean dry and intact.  Extremities:  Left lower extremity post op TMA and with dressing. Left lower extremity is warm to touch. Right lower extremity has palpable pulses and warm to touch Abdomen:  Positive bowel sounds throughout, soft, flat and non tender and non distended Neurologic: AAOX3 and follows all commands, answers all questions appropriately  CBC    Component Value Date/Time   WBC 9.4 08/23/2022 0210   RBC 2.92 (L) 08/23/2022 0210   HGB 8.8 (L) 08/23/2022 0210   HCT 27.6 (L) 08/23/2022 0210   PLT 356 08/23/2022 0210   MCV 94.5 08/23/2022 0210   MCH 30.1 08/23/2022 0210   MCHC 31.9 08/23/2022 0210   RDW 12.8 08/23/2022 0210   LYMPHSABS 0.7 08/17/2022 0944   MONOABS 0.7 08/17/2022 0944   EOSABS 0.0 08/17/2022 0944   BASOSABS 0.0 08/17/2022 0944    BMET    Component Value Date/Time   NA 133 (L) 08/23/2022 0210   K 4.2 08/23/2022 0210   CL 103 08/23/2022 0210   CO2 22 08/23/2022 0210   GLUCOSE 242 (H) 08/23/2022 0210   GLUCOSE 402 (H) 09/17/2012 1417   BUN 21 08/23/2022 0210   CREATININE 1.27 (H) 08/23/2022 0210   CALCIUM 8.1 (L) 08/23/2022 0210    GFRNONAA 60 (L) 08/23/2022 0210   GFRAA 58 (L) 06/02/2015 0656    INR    Component Value Date/Time   INR 1.2 08/17/2022 0944     Intake/Output Summary (Last 24 hours) at 08/23/2022 1156 Last data filed at 08/23/2022 0815 Gross per 24 hour  Intake 551.78 ml  Output 1000 ml  Net -448.22 ml     Assessment/Plan:  72 y.o. male is s/p Left lower angiogram with angioplasty.  1 Day Post-Op   PLAN: Patient recovering as expected. Good blood flow to left lower extremity.  Patient going back to the Operating room later today for washout. Per vascular surgery patient will need to be converted to Plavix 75 mg Daily and Eliquis 5 mg twice daily post heparin infusion.  Patient HGBA1C last is 7.0 converts to average BG of 175. Patient requests to see dietician for assistance with blood glucose and diet.    DVT prophylaxis:  Heparin Infusion    Marcie [facility] 08/23/2022 11:56 AM

## 2022-08-23 NOTE — Anesthesia Preprocedure Evaluation (Signed)
Anesthesia Evaluation  Patient identified by MRN, date of birth, ID band Patient awake    Reviewed: Allergy & Precautions, NPO status , Patient's Chart, lab work & pertinent test results  History of Anesthesia Complications Negative for: history of anesthetic complications  Airway Mallampati: III   Neck ROM: Full    Dental  (+) Missing, Dental Advidsory Given   Pulmonary neg shortness of breath, COPD, neg recent URI, Current Smoker and Patient abstained from smoking.   Pulmonary exam normal breath sounds clear to auscultation       Cardiovascular hypertension, (-) angina + CAD (s/p 4V CABG 2014)  Normal cardiovascular exam(-) dysrhythmias + Valvular Problems/Murmurs  Rhythm:Regular Rate:Normal  ECG 08/17/22: Atrial fibrillation Low voltage, precordial leads Borderline T abnormalities, diffuse leads   Neuro/Psych neg Seizures  Neuromuscular disease (diabetic peripheral neuropathy)    GI/Hepatic negative GI ROS,,,  Endo/Other  diabetes, Type 2  Obesity   Renal/GU Renal disease (stage III CKD)     Musculoskeletal Gout    Abdominal   Peds  Hematology negative hematology ROS (+)   Anesthesia Other Findings Last dose of Trulicity 08/13/22.   From hospitalist Dr. Eliane Decree note 08/19/22:  Atrial fibrillation, New, rate controlled H/o CAD,s/p CABG x1 in 2014 --repeat EKG showed Afib -- patient denies chest pain shortness of breath and palpitations -anesthesia requires cardiology clearance. -- Discussed with cardiology Dr. Welton Flakes--- recommends proceeding with surgery   Reproductive/Obstetrics                             Anesthesia Physical Anesthesia Plan  ASA: 3  Anesthesia Plan: General   Post-op Pain Management:    Induction: Intravenous  PONV Risk Score and Plan: 1 and Propofol infusion, TIVA and Treatment may vary due to age or medical condition  Airway Management Planned: Natural  Airway and Simple Face Mask  Additional Equipment:   Intra-op Plan:   Post-operative Plan:   Informed Consent: I have reviewed the patients History and Physical, chart, labs and discussed the procedure including the risks, benefits and alternatives for the proposed anesthesia with the patient or authorized representative who has indicated his/her understanding and acceptance.       Plan Discussed with: CRNA  Anesthesia Plan Comments: (LMA/GETA backup discussed.  Patient consented for risks of anesthesia including but not limited to:  - adverse reactions to medications - damage to eyes, teeth, lips or other oral mucosa - nerve damage due to positioning  - sore throat or hoarseness - damage to heart, brain, nerves, lungs, other parts of body or loss of life  Informed patient about role of CRNA in peri- and intra-operative care.  Patient voiced understanding.)        Anesthesia Quick Evaluation

## 2022-08-23 NOTE — Op Note (Signed)
Operative note   Surgeon:Sarrinah Gardin [employer] logistics/support/administrative officer: None    Preop diagnosis: Abscess left foot    Postop diagnosis: Same    Procedure: 1.  Incision and drainage multiple sites left foot  2.  Incision and drainage medial left ankle    EBL: 20 mL    Anesthesia:local and IV sedation.  Local consist of a total of 19 cc of 0.5% bupivacaine along all incision sites    Hemostasis: None    Specimen: Deep wound culture left foot    Complications: None    Operative indications:Andrew Harding is an 72 y.o. that presents today for surgical intervention.  The risks/benefits/alternatives/complications have been discussed and consent has been given.    Procedure:  Patient was brought into the OR and placed on the operating table in thesupine position. After anesthesia was obtained theleft lower extremity was prepped and draped in usual sterile fashion.  Attention was directed to the left foot where previous transmetatarsal amputation site had been performed as well as a medial arch incision and finally a medial ankle incision.  All incision sites were reopened.  Sutures were removed.  At this time purulent was noted to the left foot into the medial arch area.  Initially the wounds were flushed with copious amounts of irrigation with a bulb syringe.  A wound culture was performed.  Next a Versajet was used to continue with incision and drainage and debridement of all deep tissue and removal of nonviable tissue.  There was noted to be a fair amount of bleeding to all sites.  Good perfusion of the plantar flap was noted at this time.  The medial arch site was also debrided with the Versajet and flushed with copious amounts of irrigation.  Finally the medial ankle site was opened.  This was about 3 cm in length.  This was flushed with copious amounts of irrigation.  After final evaluation and no further areas of purulent drainage I was able to perform closure of the skin with a 2-0 nylon to all sites.  All  sites were packed with iodoform impregnated gauze.  A large bulky sterile dressing was applied.    Patient tolerated the procedure and anesthesia well.  Was transported from the OR to the PACU with all vital signs stable and vascular status intact. To be discharged per routine protocol.  Will follow up in approximately 1 week in the outpatient clinic.

## 2022-08-23 NOTE — Progress Notes (Signed)
ANTICOAGULATION CONSULT NOTE  Pharmacy Consult for heparin infusion Indication: atrial fibrillation  Patient Measurements: Height:  (190.5 cm) Weight: 113.4 kg (250 lb) IBW/kg (Calculated) : 84.5 Heparin Dosing Weight: 108 kg  Labs: Recent Labs    08/21/22 0517 08/21/22 0900 08/21/22 2244 08/22/22 0716 08/23/22 0210  HGB  --  9.2*  --  8.5* 8.8*  HCT  --  27.8*  --  25.9* 27.6*  PLT  --  331  --  302 356  HEPARINUNFRC  --   --  0.23* 0.37 0.29*  CREATININE 1.36*  --   --  1.26* 1.27*    Estimated Creatinine Clearance: 70.4 mL/min (A) (by C-G formula based on SCr of 1.27 mg/dL (H)).  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 male w/ PMH of DM, periph neuropathy, HTN, HLD, CAD-CABG, CKD admitted for cellulitis now with new onset atrial fibrillation. He is on no chronic anticoagulation.  Goal of Therapy:  Heparin level 0.3-0.7 units/ml Monitor platelets by anticoagulation protocol: Yes   Plan:  --heparin is being held for I&D today by podiatry per request from Dr Ether Griffins --restart heparin at  2100 units/hr at 1700 (podiatry per request from Dr Ether Griffins) ---Will recheck HL 8 hours after restart  Lowella Bandy, PharmD 08/23/2022,8:16 AM

## 2022-08-23 NOTE — Progress Notes (Signed)
Occupational Therapy Treatment Patient Details Name: Andrew Harding MRN: [id number] DOB: [birth year] Today's Date: 08/23/2022   History of present illness Andrew Harding is a 72 year old male with recent toe amputations on left foot. Found to have worsening infection with gangrene and osteomyelitis. 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 History of diabetes mellitus.   OT comments  Andrew Harding was seen for OT treatment on this date. Upon arrival to room, pt semi-supine in bed, agreeable to OT tx session. Pt eager to attempt standing grooming tasks at sink. OT facilitated ADL management as described below. Pt demonstrates improved adherence to NWB precaution on his operative extremity. Post op-shoe donned t/o session. Pt able to perofrm STS from elevated bed height with supervision, min cueing for NWB through LLE. He fatigues quickly with progression but is able to stand at the sink for ~3 min with 1 UE support on RW to complete simulated grooming tasks with close min guard.  Pt progressing toward goals and continues to benefit from skilled OT services to maximize return to PLOF and minimize risk of future falls, injury, caregiver burden, and readmission. Will continue to follow POC. Continue to anticipate the need for high intensity rehab services upon acute hospital DC.    Recommendations for follow up therapy are one component of a multi-disciplinary discharge planning process, led by the attending physician.  Recommendations may be updated based on patient status, additional functional criteria and insurance authorization.    Assistance Recommended at Discharge Intermittent Supervision/Assistance  Patient can return home with the following  A little help with walking and/or transfers;A little help with bathing/dressing/bathroom;Assistance with cooking/housework;Assist for transportation;Help with stairs or ramp for entrance   Equipment Recommendations       Recommendations for Other Services       Precautions / Restrictions Precautions Precautions: Fall Restrictions Weight Bearing Restrictions: Yes LLE Weight Bearing: Non weight bearing Other Position/Activity Restrictions: post-op shoe       Mobility Bed Mobility Overal bed mobility: Modified Independent Bed Mobility: Supine to Sit, Sit to Supine     Supine to sit: HOB elevated, Supervision Sit to supine: Supervision, HOB elevated   General bed mobility comments: Increased time/effort to perform.    Transfers Overall transfer level: Needs assistance Equipment used: Rolling walker (2 wheels) Transfers: Sit to/from Stand Sit to Stand: From elevated surface, Supervision           General transfer comment: verbal cues for technique, hand placement, LLE positioning to maintain NWB of LLE with transfers. encouraged proper use of rolling walker positioning and use of upper body support to maintain NWB of LLE with transfer. remains limited by fatigue     Balance Overall balance assessment: Needs assistance Sitting-balance support: Feet supported Sitting balance-Leahy Scale: Good     Standing balance support: Bilateral upper extremity supported, During functional activity, Reliant on assistive device for balance Standing balance-Leahy Scale: Poor Standing balance comment: external support required. heavy reliance on rolling walker in order to maintain true NWB of LLE; briefly able to stand without UE support, but is very limited by fatigue.                           ADL either performed or assessed with clinical judgement   ADL       Grooming: Standing;Min guard;Cueing for compensatory techniques;Cueing for safety Grooming Details (indicate cue type and reason): Simulated UB grooming at sink while standing with RW. Pt able  to stand briefly without UE support, but fatigues quickly. Educated on compensatory ADL management strategies to maximize safety, functional independence, and adherence to NWB  precautions.                             Functional mobility during ADLs: Min guard;Cueing for safety;Rolling walker (2 wheels) General ADL Comments: Min guard for functional mobiltiy with RW across short distance from EOB to sink (~4') Upon return to bed, x1 instance of posterior LOB appreciated with assist required for safe lowering onto seated surface and to maintain NWB. Continues to require close contact guard during functional mobility and multimodal cueing to maintain NWB status during functional mobiltiy.    Extremity/Trunk Assessment              Vision Baseline Vision/History: 1 Wears glasses Patient Visual Report: No change from baseline     Perception     Praxis      Cognition Arousal/Alertness: Awake/alert Behavior During Therapy: WFL for tasks assessed/performed Overall Cognitive Status: Within Functional Limits for tasks assessed                                          Exercises Other Exercises Other Exercises: OT facilitated ADL management as described above with ongoing education re: safety, falls prevention, safe use of AE/DME for ADL management, and routines modifications to support safety and functional independence during ADL management.    Shoulder Instructions       General Comments      Pertinent Vitals/ Pain       Pain Assessment Pain Assessment: No/denies pain  Home Living                                          Prior Functioning/Environment              Frequency  Min 3X/week        Progress Toward Goals  OT Goals(current goals can now be found in the care plan section)  Progress towards OT goals: Progressing toward goals  Acute Rehab OT Goals Patient Stated Goal: to get back to PLOF OT Goal Formulation: With patient Time For Goal Achievement: 09/03/22 Potential to Achieve Goals: Good  Plan Discharge plan remains appropriate;Frequency remains appropriate     Co-evaluation                 AM-PAC OT "6 Clicks" Daily Activity     Outcome Measure   Help from another person eating meals?: None Help from another person taking care of personal grooming?: A Little Help from another person toileting, which includes using toliet, bedpan, or urinal?: A Little Help from another person bathing (including washing, rinsing, drying)?: A Lot Help from another person to put on and taking off regular upper body clothing?: None Help from another person to put on and taking off regular lower body clothing?: A Little 6 Click Score: 19    End of Session Equipment Utilized During Treatment: Gait belt;Rolling walker (2 wheels)  OT Visit Diagnosis: Other abnormalities of gait and mobility (R26.89)   Activity Tolerance Patient tolerated treatment well   Patient Left in bed;with call bell/phone within reach;with family/visitor present   Nurse Communication  Time: 0981-1914 OT Time Calculation (min): 21 min  Charges: OT General Charges $OT Visit: 1 Visit OT Treatments $Self Care/Home Management : 8-22 mins  Rockney Ghee, M.S., OTR/L 08/23/22, 11:18 AM

## 2022-08-23 NOTE — Progress Notes (Signed)
PROGRESS NOTE    Andrew Harding  ZOX:[id number] DOB: 1950/08/19 DOA: 08/17/2022 PCP: Lynnea Ferrier, MD   Brief Narrative:  72 year old with history of poorly controlled insulin-dependent DM 2, peripheral neuropathy, HLD, HTN, CAD status post CABG sent to the hospital from Dr. Irene Limbo office due to worsening left foot swelling and erythema.  Patient also had a syncopal episode.  Patient underwent left lower extremity angiogram by vascular on 4/23.  Successful recannulization was performed with improvement in blood flows.  Infectious disease is currently following, antibiotics per their recommendations.   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  ID, vascular and podiatry following - Empiric antibiotics- Meropenem -Underwent LLE angiogram by vascular 4/23, successful recannulization and improvement in blood flows -Likely amputation by podiatry -Follow culture data  --WC MODERATE ENTEROBACTER CLOACAE  MODERATE ENTEROCOCCUS FAECALIS     Atrial fibrillation, New, rate controlled H/o CAD,s/p CABG x1 in 2014 New onset, previous provider discussed with Dr. Lennette Bihari from cardiology Currently on heparin drip, eventually transition to Eliquis Echo shows preserved EF  Peripheral arterial disease - Currently on aspirin, Plavix. Statin on hold due to transaminitis.  -Check lipid panel   Uncontrolled type II diabetes with peripheral neuropathy and CKD stage IIIB -A1c 6.9.  Sliding scale and Accu-Cheks -Gabapentin - Semglee 50 units bedtime.  Sliding scale and Accu-Chek   Hypotension with syncopal episode  in the setting of pre-renal azotemia in post-op period CT head, CTA chest unremarkable.  Echocardiogram shows preserved EF   CKD stage IIIB -Creatinine stable around 1.4   Hyperlipidemia -- continue statins  Eventually should be on Eliquis and plvix, will dc ASA  DVT prophylaxis: Hep Drip Code Status: Full Code Family Communication:   Status is: Inpatient Plans for surgery later today       Diet Orders (From admission, onward)     Start     Ordered   08/22/22 1640  Diet regular Room service appropriate? Yes; Fluid consistency: Thin  Diet effective now       Question Answer Comment  Room service appropriate? Yes   Fluid consistency: Thin      08/22/22 1639            Subjective: No complaints Surgery later today   Examination:  General exam: Appears calm and comfortable  Respiratory system: Clear to auscultation. Respiratory effort normal. Cardiovascular system: S1 & S2 heard, RRR. No JVD, murmurs, rubs, gallops or clicks. No pedal edema. Gastrointestinal system: Abdomen is nondistended, soft and nontender. No organomegaly or masses felt. Normal bowel sounds heard. Central nervous system: Alert and oriented. No focal neurological deficits. Extremities: Symmetric 5 x 5 power. Skin: left foot dressing in place Psychiatry: Judgement and insight appear normal. Mood & affect appropriate.  Objective: Vitals:   08/22/22 1700 08/22/22 2000 08/22/22 2005 08/23/22 0400  BP: (!) 146/74 (!) 146/70  109/65  Pulse: 79 (!) 111 98 72  Resp: Temp:  98 F (36.7 C)  98.1 F (36.7 C)  TempSrc:  Oral  Oral  SpO2: 99% 99% 99% 98%  Weight:  Height:        Intake/Output Summary (Last 24 hours) at 08/23/2022 0805 Last data filed at 08/23/2022 0643 Gross per 24 hour  Intake 551.78 ml  Output --  Net 551.78 ml   Filed Weights   08/17/22 0913  Weight: 113.4 kg    Scheduled Meds:  aspirin EC  81 mg Oral Daily   chlorhexidine  60 mL Topical Once    cholecalciferol  2,000 Units Oral Daily   clopidogrel  75 mg Oral Q breakfast   docusate sodium  100 mg Oral BID   feeding supplement  237 mL Oral BID BM   gabapentin  300 mg Oral TID   insulin aspart  0-5 Units Subcutaneous QHS   insulin aspart  0-9 Units Subcutaneous TID WC   insulin glargine-yfgn  50 Units Subcutaneous QHS   metoprolol tartrate  12.5 mg Oral BID   povidone-iodine  2 Application Topical Once   sodium chloride flush  3 mL Intravenous Q12H   Continuous Infusions:  sodium chloride 10 mL/hr at 08/23/22 0643   sodium chloride     heparin 2,100 Units/hr (08/23/22 0643)   meropenem (MERREM) IV Stopped (08/23/22 0510)    Nutritional status     Body mass index is 31.25 kg/m.  Data Reviewed:   CBC: Recent Labs  Lab 08/17/22 0944 08/19/22 0453 08/19/22 1123 08/20/22 0831 08/21/22 0900 08/22/22 0716 08/23/22 0210  WBC 15.9*   < > 13.0* 14.2* 15.9* 10.6* 9.4  NEUTROABS 14.3*  --   --   --   --   --   --   HGB 11.4*   < > 9.5* 8.8* 9.2* 8.5* 8.8*  HCT 34.8*   < > 28.6* 26.7* 27.8* 25.9* 27.6*  MCV 93.5   < > 92.6 93.4 92.7 92.8 94.5  PLT 372   < > 276 282 331 302 356   < > = values in this interval not displayed.   Basic Metabolic Panel: Recent Labs  Lab 08/17/22 0944 08/19/22 0453 08/19/22 1123 08/21/22 0515 08/21/22 0517 08/22/22 0716 08/23/22 0210  NA 135 133* 134*  --   --  135 133*  K 3.4* 4.0 3.9  --   --  3.9 4.2  CL 100 103 104  --   --  104 103  CO2 24 21* 23  --   --  23 22  GLUCOSE 146* 254* 157*  --   --  113* 242*  BUN 29* 19 18  --   --  20 21  CREATININE 2.02* 1.50* 1.49*  --  1.36* 1.26* 1.27*  CALCIUM 9.0 8.0* 8.1*  --   --  8.2* 8.1*  MG 1.3*  --   --  1.7  --   --   --    GFR: Estimated Creatinine Clearance: 70.4 mL/min (A) (by C-G formula based on SCr of 1.27 mg/dL (H)). Liver Function Tests: Recent Labs  Lab 08/17/22 0944 08/19/22 1123 08/22/22 0716 08/23/22 0210  AST 244* 126* 157* 98*  ALT 181* 153* 119* 99*   ALKPHOS 132* 127* 162* 183*  BILITOT 0.9 0.9 0.6 0.7  PROT 8.1 6.6 6.3* 6.3*  ALBUMIN 3.3* 2.4* 2.2* 2.2*   No results for input(s): "LIPASE", "AMYLASE" in the last 168 hours. No results for input(s): "AMMONIA" in the last 168 hours. Coagulation Profile: Recent Labs  Lab 08/17/22 0944  INR 1.2   Cardiac Enzymes: Recent Labs  Lab 08/17/22 1219  CKTOTAL 43*   BNP (last  3 results) No results for input(s): "PROBNP" in the last 8760 hours. HbA1C: No results for input(s): "HGBA1C" in the last 72 hours. CBG: Recent Labs  Lab 08/21/22 2108 08/22/22 0803 08/22/22 1144 08/22/22 1459 08/22/22 2105  GLUCAP 126* 109* 107* 105* 186*   Lipid Profile: No results for input(s): "CHOL", "HDL", "LDLCALC", "TRIG", "CHOLHDL", "LDLDIRECT" in the last 72 hours. Thyroid Function Tests: No results for input(s): "TSH", "T4TOTAL", "FREET4", "T3FREE", "THYROIDAB" in the last 72 hours. Anemia Panel: No results for input(s): "VITAMINB12", "FOLATE", "FERRITIN", "TIBC", "IRON", "RETICCTPCT" in the last 72 hours. Sepsis Labs: Recent Labs  Lab 08/17/22 0944 08/17/22 1823  LATICACIDVEN 1.7 1.8    Recent Results (from the past 240 hour(s))  Culture, blood (Routine x 2)     Status: None   Collection Time: 08/17/22  9:44 AM   Specimen: BLOOD RIGHT FOREARM  Result Value Ref Range Status   Specimen Description BLOOD RIGHT FOREARM  Final   Special Requests   Final    BOTTLES DRAWN AEROBIC AND ANAEROBIC Blood Culture adequate volume   Culture   Final    NO GROWTH 5 DAYS Performed at [facility], [address] [place], [place] [address]    Report Status 08/22/2022 FINAL  Final  Culture, blood (Routine x 2)     Status: None   Collection Time: 08/17/22  9:44 AM   Specimen: BLOOD RIGHT WRIST  Result Value Ref Range Status   Specimen Description BLOOD RIGHT WRIST  Final   Special Requests   Final    BOTTLES DRAWN AEROBIC AND ANAEROBIC Blood Culture results may not be optimal due to  an inadequate volume of blood received in culture bottles   Culture   Final    NO GROWTH 5 DAYS Performed at [facility], [address], [place], [place] [address]    Report Status 08/22/2022 FINAL  Final  Aerobic Culture w Gram Stain (superficial specimen)     Status: None   Collection Time: 08/18/22 10:39 AM   Specimen: Wound  Result Value Ref Range Status   Specimen Description   Final    WOUND Performed at [facility], [address], [place], [place] [address]    Special Requests   Final    LEFT TOE Performed at [facility], [address] [place], [place] [address]    Gram Stain   Final    FEW WBC PRESENT, PREDOMINANTLY PMN MODERATE GRAM NEGATIVE RODS FEW GRAM POSITIVE COCCI Performed at [facility], [address] [place], [place] [address]    Culture   Final    MODERATE ENTEROBACTER CLOACAE MODERATE ENTEROCOCCUS FAECALIS    Report Status 08/21/2022 FINAL  Final   Organism ID, Bacteria ENTEROBACTER CLOACAE  Final   Organism ID, Bacteria ENTEROCOCCUS FAECALIS  Final      Susceptibility   Enterobacter cloacae - MIC*    CEFEPIME <=0.12 SENSITIVE Sensitive     CEFTAZIDIME <=1 SENSITIVE Sensitive     CIPROFLOXACIN <=0.25 SENSITIVE Sensitive     GENTAMICIN <=1 SENSITIVE Sensitive     IMIPENEM 1 SENSITIVE Sensitive     TRIMETH/SULFA >=320 RESISTANT Resistant     PIP/TAZO <=4 SENSITIVE Sensitive     * MODERATE ENTEROBACTER CLOACAE   Enterococcus faecalis - MIC*    AMPICILLIN <=2 SENSITIVE Sensitive     VANCOMYCIN 1 SENSITIVE Sensitive     GENTAMICIN SYNERGY SENSITIVE Sensitive     * MODERATE ENTEROCOCCUS FAECALIS  Radiology Studies: ECHOCARDIOGRAM COMPLETE  Result Date: 08/23/2022    ECHOCARDIOGRAM REPORT   Patient Name:   LATERRANCE NAUTA Date of Exam: 08/21/2022 Medical Rec #:  [id number]   Height:       75.0 in Accession #:    [id number]  Weight:       250.0 lb Date of Birth:  1950-09-22    BSA:          2.413 m  Patient Age:    72 years    BP:           132/64 mmHg Patient Gender: M           HR:           87 bpm. Exam Location:  [facility] Procedure: 2D Echo, Cardiac Doppler and Color Doppler Indications:     Atrial Fibrillation I48.91  History:         Patient has no prior history of Echocardiogram examinations.                  Prior CABG; Risk Factors:Hypertension, Dyslipidemia and                  Diabetes.  Sonographer:     Cristela Blue Referring Phys:  [id number] LILY MICHELLE TANG Diagnosing Phys: Alwyn Pea MD IMPRESSIONS  1. Left ventricular ejection fraction, by estimation, is 65 to 70%. The left ventricle has normal function. The left ventricle demonstrates regional wall motion abnormalities (see scoring diagram/findings for description). Left ventricular diastolic parameters were normal.  2. Right ventricular systolic function is normal. The right ventricular size is normal.  3. The mitral valve is normal in structure. No evidence of mitral valve regurgitation.  4. The aortic valve is normal in structure. Aortic valve regurgitation is not visualized. FINDINGS  Left Ventricle: Left ventricular ejection fraction, by estimation, is 65 to 70%. The left ventricle has normal function. The left ventricle demonstrates regional wall motion abnormalities. The left ventricular internal cavity size was normal in size. There is no left ventricular hypertrophy. Left ventricular diastolic parameters were normal. Right Ventricle: The right ventricular size is normal. No increase in right ventricular wall thickness. Right ventricular systolic function is normal. Left Atrium: Left atrial size was normal in size. Right Atrium: Right atrial size was normal in size. Pericardium: There is no evidence of pericardial effusion. Mitral Valve: The mitral valve is normal in structure. No evidence of mitral valve regurgitation. MV peak gradient, 6.5 mmHg. The mean mitral valve gradient is 3.0 mmHg. Tricuspid Valve: The tricuspid valve is  normal in structure. Tricuspid valve regurgitation is trivial. Aortic Valve: The aortic valve is normal in structure. Aortic valve regurgitation is not visualized. Aortic valve mean gradient measures 2.0 mmHg. Aortic valve peak gradient measures 3.8 mmHg. Aortic valve area, by VTI measures 3.63 cm. Pulmonic Valve: The pulmonic valve was grossly normal. Pulmonic valve regurgitation is not visualized. Aorta: The ascending aorta was not well visualized. IAS/Shunts: No atrial level shunt detected by color flow Doppler.  LEFT VENTRICLE PLAX 2D LVIDd:         4.40 cm LVIDs:         2.70 cm LV PW:         1.00 cm LV IVS:        1.10 cm LVOT diam:     2.20 cm LV SV:         56 LV SV Index:   23 LVOT Area:  3.80 cm  RIGHT VENTRICLE RV Basal diam:  3.30 cm RV Mid diam:    3.70 cm RV S prime:     12.20 cm/s TAPSE (M-mode): 1.4 cm LEFT ATRIUM             Index        RIGHT ATRIUM           Index LA diam:        4.30 cm 1.78 cm/m   RA Area:     19.80 cm LA Vol (A2C):   80.0 ml 33.16 ml/m  RA Volume:   57.90 ml  24.00 ml/m LA Vol (A4C):   80.8 ml 33.49 ml/m LA Biplane Vol: 83.0 ml 34.40 ml/m  AORTIC VALVE AV Area (Vmax):    3.11 cm AV Area (Vmean):   3.41 cm AV Area (VTI):     3.63 cm AV Vmax:           97.45 cm/s AV Vmean:          70.150 cm/s AV VTI:            0.154 m AV Peak Grad:      3.8 mmHg AV Mean Grad:      2.0 mmHg LVOT Vmax:         79.80 cm/s LVOT Vmean:        63.000 cm/s LVOT VTI:          0.147 m LVOT/AV VTI ratio: 0.95  AORTA Ao Root diam: 3.50 cm MITRAL VALVE                TRICUSPID VALVE MV Area (PHT): 4.63 cm     TR Peak grad:   25.0 mmHg MV Area VTI:   3.12 cm     TR Vmax:        250.00 cm/s MV Peak grad:  6.5 mmHg MV Mean grad:  3.0 mmHg     SHUNTS MV Vmax:       1.27 m/s     Systemic VTI:  0.15 m MV Vmean:      71.7 cm/s    Systemic Diam: 2.20 cm MV Decel Time: 164 msec MV E velocity: 127.00 cm/s Alwyn Pea MD Electronically signed by Alwyn Pea MD Signature Date/Time:  08/23/2022/7:30:34 AM    Final    PERIPHERAL VASCULAR CATHETERIZATION  Result Date: 08/22/2022 See surgical note for result.          LOS: 6 days   Time spent= 35 mins    Joslynne Klatt Joline Maxcy, MD [facility]  If 7PM-7AM, please contact night-coverage  08/23/2022, 8:05 AM

## 2022-08-24 ENCOUNTER — Other Ambulatory Visit (HOSPITAL_COMMUNITY): Payer: Self-pay

## 2022-08-24 ENCOUNTER — Encounter: Payer: Self-pay | Admitting: Podiatry

## 2022-08-24 DIAGNOSIS — R7401 Elevation of levels of liver transaminase levels: Secondary | ICD-10-CM | POA: Diagnosis not present

## 2022-08-24 DIAGNOSIS — Z79899 Other long term (current) drug therapy: Secondary | ICD-10-CM

## 2022-08-24 DIAGNOSIS — E11628 Type 2 diabetes mellitus with other skin complications: Secondary | ICD-10-CM

## 2022-08-24 DIAGNOSIS — M86172 Other acute osteomyelitis, left ankle and foot: Secondary | ICD-10-CM

## 2022-08-24 DIAGNOSIS — A419 Sepsis, unspecified organism: Secondary | ICD-10-CM | POA: Diagnosis not present

## 2022-08-24 DIAGNOSIS — Z89432 Acquired absence of left foot: Secondary | ICD-10-CM | POA: Diagnosis not present

## 2022-08-24 DIAGNOSIS — B952 Enterococcus as the cause of diseases classified elsewhere: Secondary | ICD-10-CM | POA: Diagnosis not present

## 2022-08-24 HISTORY — DX: Other acute osteomyelitis, left ankle and foot: M86.172

## 2022-08-24 LAB — GLUCOSE, CAPILLARY
Glucose-Capillary: 134 mg/dL — ABNORMAL HIGH (ref 70–99)
Glucose-Capillary: 177 mg/dL — ABNORMAL HIGH (ref 70–99)
Glucose-Capillary: 272 mg/dL — ABNORMAL HIGH (ref 70–99)
Glucose-Capillary: 157 mg/dL — ABNORMAL HIGH (ref 70–99)

## 2022-08-24 LAB — COMPREHENSIVE METABOLIC PANEL
ALT: 69 U/L — ABNORMAL HIGH (ref 0–44)
AST: 49 U/L — ABNORMAL HIGH (ref 15–41)
Albumin: 2.2 g/dL — ABNORMAL LOW (ref 3.5–5.0)
Alkaline Phosphatase: 172 U/L — ABNORMAL HIGH (ref 38–126)
Anion gap: 9 (ref 5–15)
BUN: 14 mg/dL (ref 8–23)
CO2: 21 mmol/L — ABNORMAL LOW (ref 22–32)
Calcium: 8.3 mg/dL — ABNORMAL LOW (ref 8.9–10.3)
Chloride: 106 mmol/L (ref 98–111)
Creatinine, Ser: 1.18 mg/dL (ref 0.61–1.24)
GFR, Estimated: >60 mL/min (ref >60)
Glucose, Bld: 154 mg/dL — ABNORMAL HIGH (ref 70–99)
Potassium: 3.6 mmol/L (ref 3.5–5.1)
Sodium: 136 mmol/L (ref 135–145)
Total Bilirubin: 0.6 mg/dL (ref 0.3–1.2)
Total Protein: 6.6 g/dL (ref 6.5–8.1)

## 2022-08-24 LAB — LIPID PANEL
Cholesterol: 92 mg/dL (ref 0–200)
HDL: 17 mg/dL — ABNORMAL LOW (ref >40)
LDL Cholesterol: 54 mg/dL (ref 0–99)
Total CHOL/HDL Ratio: 5.4 RATIO
Triglycerides: 104 mg/dL (ref <150)
VLDL: 21 mg/dL (ref 0–40)

## 2022-08-24 LAB — HEPARIN LEVEL (UNFRACTIONATED): Heparin Unfractionated: 0.26 IU/mL — ABNORMAL LOW (ref 0.30–0.70)

## 2022-08-24 LAB — CBC
HCT: 28 % — ABNORMAL LOW (ref 39.0–52.0)
Hemoglobin: 9 g/dL — ABNORMAL LOW (ref 13.0–17.0)
MCH: 30.2 pg (ref 26.0–34.0)
MCHC: 32.1 g/dL (ref 30.0–36.0)
MCV: 94 fL (ref 80.0–100.0)
Platelets: 354 10*3/uL (ref 150–400)
RBC: 2.98 MIL/uL — ABNORMAL LOW (ref 4.22–5.81)
RDW: 12.8 % (ref 11.5–15.5)
WBC: 9.6 10*3/uL (ref 4.0–10.5)
nRBC: 0 % (ref 0.0–0.2)

## 2022-08-24 LAB — AEROBIC/ANAEROBIC CULTURE W GRAM STAIN (SURGICAL/DEEP WOUND): Culture: NO GROWTH

## 2022-08-24 MED ORDER — APIXABAN 5 MG PO TABS
5.0000 mg | ORAL_TABLET | Freq: Two times a day (BID) | ORAL | Status: DC
  Administered 2022-08-24 – 2022-08-29 (×11): 5 mg via ORAL
  Filled 2022-08-24 (×11): qty 1

## 2022-08-24 MED ORDER — METOPROLOL TARTRATE 5 MG/5ML IV SOLN: 5.0000 mg | INTRAVENOUS | Status: DC | PRN

## 2022-08-24 MED ORDER — HYDRALAZINE HCL 20 MG/ML IJ SOLN: 10.0000 mg | INTRAMUSCULAR | Status: DC | PRN

## 2022-08-24 MED ORDER — HEPARIN BOLUS VIA INFUSION
1600.0000 [IU] | Freq: Once | INTRAVENOUS | Status: AC
  Administered 2022-08-24: 1600 [IU] via INTRAVENOUS
  Filled 2022-08-24: qty 1600

## 2022-08-24 MED ORDER — SENNOSIDES-DOCUSATE SODIUM 8.6-50 MG PO TABS: 1.0000 | ORAL_TABLET | Freq: Every evening | ORAL | Status: DC | PRN

## 2022-08-24 MED ORDER — GUAIFENESIN 100 MG/5ML PO LIQD: 5.0000 mL | ORAL | Status: DC | PRN

## 2022-08-24 MED ORDER — IPRATROPIUM-ALBUTEROL 0.5-2.5 (3) MG/3ML IN SOLN: 3.0000 mL | RESPIRATORY_TRACT | Status: DC | PRN

## 2022-08-24 NOTE — Anesthesia Postprocedure Evaluation (Signed)
Anesthesia Post Note  [name]  Procedure(s) Performed: INCISION AND DRAINAGE (Left: Foot)  Patient location during evaluation: PACU Anesthesia Type: General Level of consciousness: awake and alert Pain management: pain level controlled Vital Signs Assessment: post-procedure vital signs reviewed and stable Respiratory status: spontaneous breathing, nonlabored ventilation, respiratory function stable and patient connected to nasal cannula oxygen Cardiovascular status: blood pressure returned to baseline and stable Postop Assessment: no apparent nausea or vomiting Anesthetic complications: no   No notable events documented.   Last Vitals:  Vitals:   08/23/22 2000 08/24/22 0600  BP: 114/60 (!) 100/57  Pulse: 86 65  Resp: 18 16  Temp: 36.6 C 36.7 C  SpO2: 100% 97%    Last Pain:  Vitals:   08/24/22 0600  TempSrc: Oral  PainSc:                  Lenard Simmer

## 2022-08-24 NOTE — NC FL2 (Signed)
[place] MEDICAID FL2 LEVEL OF CARE FORM     IDENTIFICATION  Patient Name: Andrew Harding Birthdate: [birth year] Sex: male Admission Date (Current Location): 08/17/2022  [facility] and [place] Number:  Chiropodist and Address:  [facility], [address], [place], [place] [address]      Provider Number: [id number]  Attending Physician Name and Address:  Dimple Nanas, MD  Relative Name and Phone Number:       Current Level of Care: Hospital Recommended Level of Care: Skilled Nursing Facility Prior Approval Number:    Date Approved/Denied:   PASRR Number: [id number]  Discharge Plan: SNF    Current Diagnoses: Patient Active Problem List   Diagnosis Date Noted   Infection of left foot 08/19/2022   Atrial fibrillation 08/19/2022   Sepsis 08/17/2022   Cellulitis 08/17/2022   Hypotension due to hypovolemia 08/17/2022   Gout 04/05/2018   Vitamin D deficiency, unspecified 04/05/2018   Diabetic ulcer of toe of left foot associated with type 2 diabetes mellitus, limited to breakdown of skin 12/21/2017   Status post amputation of toe of right foot 11/01/2015   CKD (chronic kidney disease) stage 3, GFR 30-59 ml/min 10/08/2015   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   Osteomyelitis 05/31/2015   Type 2 diabetes mellitus with stage 3b chronic kidney disease, with long-term current use of insulin 05/31/2015   History of osteomyelitis 05/31/2015   Benign essential hypertension 09/14/2014   Coronary artery disease 09/17/2012   Mixed hyperlipidemia 09/17/2012    Orientation RESPIRATION BLADDER Height & Weight     Self, Time, Situation, Place  Normal Continent, External catheter Weight: 250 lb (113.4 kg) Height:  6\' 3"  (190.5 cm)  BEHAVIORAL SYMPTOMS/MOOD NEUROLOGICAL BOWEL NUTRITION STATUS   (None)  (None) Continent Diet (Carb modified)  AMBULATORY STATUS COMMUNICATION OF NEEDS Skin   Limited Assist Verbally Surgical wounds (Incision on foot:  Compression wrap.)                       Personal Care Assistance Level of Assistance  Bathing, Feeding, Dressing Bathing Assistance: Limited assistance Feeding assistance: Limited assistance Dressing Assistance: Limited assistance     Functional Limitations Info  Sight, Hearing, Speech Sight Info: Adequate Hearing Info: Adequate Speech Info: Adequate    SPECIAL CARE FACTORS FREQUENCY  PT (By licensed PT), OT (By licensed OT)     PT Frequency: 5 x week OT Frequency: 5 x week            Contractures Contractures Info: Not present    Additional Factors Info  Code Status, Allergies Code Status Info: Full code Allergies Info: NKDA           Current Medications (08/24/2022):  This is the current hospital active medication list Current Facility-Administered Medications  Medication Dose Route Frequency Provider Last Rate Last Admin   0.9 %  sodium chloride infusion   Intravenous PRN [name], DPM 10 mL/hr at 08/23/22 0643 Restarted at 08/23/22 1423   0.9 %  sodium chloride infusion  250 mL Intravenous PRN [name], DPM       acetaminophen (TYLENOL) tablet 650 mg  650 mg Oral Q6H PRN [name], DPM   650 mg at 08/20/22 0549   Or   acetaminophen (TYLENOL) suppository 650 mg  650 mg Rectal Q6H PRN [name], DPM       acetaminophen (TYLENOL) tablet 650 mg  650 mg Oral Q4H PRN [name], DPM  apixaban (ELIQUIS) tablet 5 mg  5 mg Oral BID Lowella Bandy, RPH   5 mg at 08/24/22 1610   bisacodyl (DULCOLAX) suppository 10 mg  10 mg Rectal Daily PRN [name], DPM       cholecalciferol (VITAMIN D3) 25 MCG (1000 UNIT) tablet 2,000 Units  2,000 Units Oral Daily [name], DPM   2,000 Units at 08/24/22 9604   clopidogrel (PLAVIX) tablet 75 mg  75 mg Oral Q breakfast [name], DPM   75 mg at 08/24/22 5409   docusate sodium (COLACE) capsule 100 mg  100 mg Oral BID [name], DPM   100 mg at 08/24/22 8119   feeding supplement  (ENSURE ENLIVE / ENSURE PLUS) liquid 237 mL  237 mL Oral BID BM [name], DPM   237 mL at 08/24/22 0925   fentaNYL (SUBLIMAZE) injection 12.5 mcg  12.5 mcg Intravenous Once PRN [name], DPM       gabapentin (NEURONTIN) capsule 300 mg  300 mg Oral TID [name], DPM   300 mg at 08/24/22 0925   guaiFENesin (ROBITUSSIN) 100 MG/5ML liquid 5 mL  5 mL Oral Q4H PRN Amin, Ankit Chirag, MD       hydrALAZINE (APRESOLINE) injection 10 mg  10 mg Intravenous Q4H PRN Amin, Ankit Chirag, MD       HYDROmorphone (DILAUDID) injection 1 mg  1 mg Intravenous Once PRN [name], DPM       insulin aspart (novoLOG) injection 0-5 Units  0-5 Units Subcutaneous QHS [name], DPM   3 Units at 08/18/22 2209   insulin aspart (novoLOG) injection 0-9 Units  0-9 Units Subcutaneous TID WC [name], DPM   1 Units at 08/21/22 1723   insulin glargine-yfgn (SEMGLEE) injection 50 Units  50 Units Subcutaneous QHS [name], DPM   50 Units at 08/23/22 2049   ipratropium-albuterol (DUONEB) 0.5-2.5 (3) MG/3ML nebulizer solution 3 mL  3 mL Nebulization Q4H PRN Amin, Ankit Chirag, MD       meropenem (MERREM) 1 g in sodium chloride 0.9 % 100 mL IVPB  1 g Intravenous Q8H [name], DPM 200 mL/hr at 08/24/22 0612 1 g at 08/24/22 0612   metoprolol tartrate (LOPRESSOR) injection 5 mg  5 mg Intravenous Q4H PRN Amin, Ankit Chirag, MD       metoprolol tartrate (LOPRESSOR) tablet 25 mg  25 mg Oral BID [name], DPM   25 mg at 08/24/22 0925   morphine (PF) 4 MG/ML injection 2 mg  2 mg Intravenous Q1H PRN [name], DPM   2 mg at 08/23/22 2048   ondansetron (ZOFRAN) tablet 4 mg  4 mg Oral Q6H PRN [name], DPM       Or   ondansetron (ZOFRAN) injection 4 mg  4 mg Intravenous Q6H PRN [name], DPM       Oral care mouth rinse  15 mL Mouth Rinse PRN [name], DPM       oxyCODONE (Oxy IR/ROXICODONE) immediate release tablet 5-10 mg  5-10 mg Oral Q4H PRN [name], DPM   10 mg at  08/22/22 2150   senna-docusate (Senokot-S) tablet 1 tablet  1 tablet Oral QHS PRN Amin, Ankit Chirag, MD       sodium chloride flush (NS) 0.9 % injection 3 mL  3 mL Intravenous Q12H [name], DPM   3 mL at 08/24/22 1143   sodium chloride flush (NS) 0.9 % injection 3 mL  3 mL Intravenous PRN [name],  DPM       traZODone (DESYREL) tablet 25 mg  25 mg Oral QHS PRN [name], DPM   25 mg at 08/23/22 2048     Discharge Medications: Please see discharge summary for a list of discharge medications.  Relevant Imaging Results:  Relevant Lab Results:   Additional Information SS#: [id number]  Margarito Liner, LCSW

## 2022-08-24 NOTE — TOC Benefit Eligibility Note (Signed)
Patient Advocate Encounter  Insurance verification completed.    The patient is currently admitted and upon discharge could be taking Eliquis 5 mg.  The current 30 day co-pay is $47.00.   The patient is insured through [employer]   This test claim was processed through [facility]- copay amounts may vary at other pharmacies due to pharmacy/plan contracts, or as the patient moves through the different stages of their insurance plan.  Tesslyn Baumert, CPHT Pharmacy Patient Advocate Specialist [facility] Patient Advocate Team Direct Number: [phone]  Fax: [phone]       

## 2022-08-24 NOTE — Progress Notes (Signed)
CCMD notified writer patient had elevated heart rate into the 140's. Patient is currently out of bed with PT. Not symptomatic.  Cornell Barman Lunabelle Oatley

## 2022-08-24 NOTE — Progress Notes (Signed)
Occupational Therapy Treatment Patient Details Name: ASBURY HAIR MRN: [id number] DOB: 1950/08/26 Today's Date: 08/24/2022   History of present illness Arel Tippen is a 72 year old male with recent toe amputations on left foot. Found to have worsening infection with gangrene and osteomyelitis. 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 History of diabetes mellitus.   OT comments  Upon entering the room, pt supine in bed but agreeable to OT intervention. Pt reports feeling much better today now that he has been able to eat. Pt performs bed mobility with supervision and with cuing utilizes circle sitting to don post op shoe with increased time. Pt stands from standard be height with min - mod lifting balance. Pt is able to maintain NWB status and hop 20' into bathroom with RW. Pt did declined toilet transfer but likely requiring mod A similar to bed height. Pt turns around and returns to sit on EOB in same manner as above. Pt's HR increased to 140's with activity and pt fatigues very quickly. Pt with further questions about therapy recommendations and once answered he returns to supine position in bed as visitor is now present. Pt is far from baseline and continues to benefit from OT intervention.    Recommendations for follow up therapy are one component of a multi-disciplinary discharge planning process, led by the attending physician.  Recommendations may be updated based on patient status, additional functional criteria and insurance authorization.    Assistance Recommended at Discharge Intermittent Supervision/Assistance  Patient can return home with the following  A little help with walking and/or transfers;A little help with bathing/dressing/bathroom;Assistance with cooking/housework;Assist for transportation;Help with stairs or ramp for entrance   Equipment Recommendations  Other (comment) (defer to next venue of care)       Precautions / Restrictions Precautions Precautions:  Fall Restrictions Weight Bearing Restrictions: Yes LLE Weight Bearing: Non weight bearing Other Position/Activity Restrictions: post-op shoe       Mobility Bed Mobility Overal bed mobility: Needs Assistance Bed Mobility: Supine to Sit, Sit to Supine     Supine to sit: HOB elevated, Supervision Sit to supine: Supervision, HOB elevated        Transfers Overall transfer level: Needs assistance Equipment used: Rolling walker (2 wheels) Transfers: Sit to/from Stand Sit to Stand: Min assist                 Balance Overall balance assessment: Needs assistance Sitting-balance support: Feet supported Sitting balance-Leahy Scale: Good     Standing balance support: Bilateral upper extremity supported, During functional activity, Reliant on assistive device for balance Standing balance-Leahy Scale: Poor Standing balance comment: external support required. heavy reliance on rolling walker but able to maintain NWB                           ADL either performed or assessed with clinical judgement    Extremity/Trunk Assessment Upper Extremity Assessment Upper Extremity Assessment: Overall WFL for tasks assessed            Vision Patient Visual Report: No change from baseline            Cognition Arousal/Alertness: Awake/alert Behavior During Therapy: WFL for tasks assessed/performed Overall Cognitive Status: Within Functional Limits for tasks assessed                                 General Comments: cooperative  Pertinent Vitals/ Pain       Pain Assessment Pain Assessment: Faces Faces Pain Scale: Hurts little more Pain Location: L foot Pain Descriptors / Indicators: Discomfort, Aching Pain Intervention(s): Limited activity within patient's tolerance, Monitored during session, Repositioned         Frequency  Min 3X/week        Progress Toward Goals  OT Goals(current goals can now be found in the  care plan section)  Progress towards OT goals: Progressing toward goals     Plan Discharge plan remains appropriate;Frequency remains appropriate       AM-PAC OT "6 Clicks" Daily Activity     Outcome Measure   Help from another person eating meals?: None Help from another person taking care of personal grooming?: A Little Help from another person toileting, which includes using toliet, bedpan, or urinal?: A Little Help from another person bathing (including washing, rinsing, drying)?: A Lot Help from another person to put on and taking off regular upper body clothing?: None Help from another person to put on and taking off regular lower body clothing?: A Little 6 Click Score: 19    End of Session Equipment Utilized During Treatment: Rolling walker (2 wheels)  OT Visit Diagnosis: Other abnormalities of gait and mobility (R26.89)   Activity Tolerance Patient tolerated treatment well   Patient Left in bed;with call bell/phone within reach;with family/visitor present   Nurse Communication Mobility status        Time: 1435-1451 OT Time Calculation (min): 16 min  Charges: OT General Charges $OT Visit: 1 Visit OT Treatments $Therapeutic Activity: 8-22 mins  Jackquline Denmark, MS, OTR/L , CBIS ascom [phone]  08/24/22, 3:29 PM

## 2022-08-24 NOTE — Progress Notes (Addendum)
[facility] Infectious Diseases Follow Up Note  Patient Identification: Patient Name: Andrew Harding MRN: [id number] Admit Date: 08/17/2022  9:16 AM Age: 72 y.o.Today's Date: 08/24/2022  Reason for Visit: Diabetic foot infection  Principal Problem:   Sepsis Active Problems:   Type 2 diabetes mellitus with stage 3b chronic kidney disease, with long-term current use of insulin   Cellulitis   Hypotension due to hypovolemia   Infection of left foot   Atrial fibrillation   Current Antibiotics:  Meropenem   Lines/Hardwares:   Interval Events: afebrile, s/p repeat I and D by Podiatry on 4/24   Assessment 72 year old male with multiple comorbidities including CAD status post CABG, PVD, gout, CKD, DM complicated with peripheral neuropathy s/p rt great toe amputation in 2017 and  left 2nd, 3rd and 4th toe amputation in 08/11/22 ( on omnicef) admitted for worsening left foot swelling and erythema.  419 left toe wound cx Enterobacter cloacae and E faecalis  4/20 s/p left TMA, Left foot and ankle incision and drainage with removal of all nonviable necrotic tissue. OR findings: Gas gangrene left forefoot with osteomyelitis and abscess tracking up flexor tendon sheath about the medial foot  4/23 left lower extremity angiogram with successful optimization of blood flow 4/24 s/p I&D of multiple sites at left foot and medial left ankle.  Purulence noted to the left foot into the medial arch area. OR cx NG in < 12 hrs    # Transaminitis - stable/improving, acute hepatitis panel negative    Recommendations Continue meropenem. Meropenem should empirically cover for E faecalis. Imipenem not available in hospital formulary which has better E faecalis coverage.  Will need PICC line for prolonged IV abtx for discharge  given given he has not completely achieved source control  Monitor CBC and CMP Fu with Podiatry plans - may need another I and D until  purulent infection subsides  Discussed with patient that high risk of recurrence of infection without adequate source control.  Will follow cultures peripherally, please call with active questions  D/w ID pharm D  Rest of the management as per the primary team. Thank you for the consult. Please page with pertinent questions or concerns.  ______________________________________________________________________ Subjective patient seen and examined at the bedside. No complaints.   Past Medical History:  Diagnosis Date   Bladder neck obstruction    Chronic kidney disease    Coronary artery disease    a.) s/p 4v CABG in 2014   Diabetes mellitus without complication    Diabetic neuropathy    Diabetic peripheral neuropathy    Diverticulosis    Gout    Heart murmur    Hypercholesteremia    Hyperlipidemia    Hypertension    Peripheral neuropathy    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   Tubular adenoma    Vitamin D deficiency    Past Surgical History:  Procedure Laterality Date   AMPUTATION Left 08/19/2022   Procedure: TRANSMETATARSAL AMPUTATION LEFT FOOT WITH IRRIGATION AND DEBRIDEMENT;  Surgeon: Rosetta Posner, DPM;  Location: [facility] ORS;  Service: Podiatry;  Laterality: Left;   AMPUTATION TOE Right 06/01/2015   Procedure: AMPUTATION TOE;  Surgeon: Recardo Evangelist, DPM;  Location: [facility] ORS;  Service: Podiatry;  Laterality: Right;   AMPUTATION TOE Left 08/11/2022   Procedure: AMPUTATION TOE 2, 3, 4;  Surgeon: [name], DPM;  Location: [facility] ORS;  Service: Podiatry;  Laterality: Left;   CATARACT EXTRACTION, BILATERAL     CIRCUMCISION N/A 06/12/2022   Procedure: CIRCUMCISION ADULT;  Surgeon: Vanna Scotland, MD;  Location: [facility] ORS;  Service: Urology;  Laterality: N/A;   COLONOSCOPY WITH PROPOFOL N/A 02/14/2016   Procedure: COLONOSCOPY WITH PROPOFOL;  Surgeon: Christena Deem, MD;  Location: [facility] ENDOSCOPY;  Service: Endoscopy;  Laterality: N/A;   COLONOSCOPY WITH PROPOFOL N/A 01/07/2019    Procedure: COLONOSCOPY WITH PROPOFOL;  Surgeon: Christena Deem, MD;  Location: [facility] ENDOSCOPY;  Service: Endoscopy;  Laterality: N/A;   CORONARY ARTERY BYPASS GRAFT N/A 08/2012   EXCISION PARTIAL PHALANX Right 06/01/2015   Procedure: EXCISION PARTIAL PHALANX /  BONE;  Surgeon: Recardo Evangelist, DPM;  Location: [facility] ORS;  Service: Podiatry;  Laterality: Right;   FLEXIBLE SIGMOIDOSCOPY N/A 05/29/2016   Procedure: FLEXIBLE SIGMOIDOSCOPY;  Surgeon: Christena Deem, MD;  Location: [facility] ENDOSCOPY;  Service: Endoscopy;  Laterality: N/A;   KNEE ARTHROSCOPY Left    LOWER EXTREMITY ANGIOGRAPHY Left 08/22/2022   Procedure: Lower Extremity Angiography;  Surgeon: Renford Dills, MD;  Location: [facility] INVASIVE CV LAB;  Service: Cardiovascular;  Laterality: Left;    Vitals BP 130/73   Pulse 78   Temp 98.1 F (36.7 C) (Oral)   Resp 16   Ht 6\' 3"  (1.905 m)   Wt 113.4 kg   SpO2 98%   BMI 31.25 kg/m      Physical Exam Constitutional:  elderly male sitting in the bed, appears stated age    Comments: comfortable, conjunctiva with no pallor or jaundice   Cardiovascular:     Rate and Rhythm: Normal rate and Irregular rhythm.     Heart sounds: s1s2  Pulmonary:     Effort: Pulmonary effort is normal    Comments: Normal breath sounds   Abdominal:     Palpations: Abdomen is soft.     Tenderness: non distended and non tender   Musculoskeletal:        General: No swelling or tenderness in peripheral joints, Left foot is bandaged   Skin:    Comments: no rashes  Neurological:     General: awake, alert and oriented, following commands   Psychiatric:        Mood and Affect: Mood normal.   Pertinent Microbiology Wound cx 06/01/2015 PsA Wound cx 05/31/2015 Acinetobacter baumanii, PsA, MS staph lugdunensis   Results for orders placed or performed during the hospital encounter of 08/17/22  Culture, blood (Routine x 2)     Status: None   Collection Time: 08/17/22  9:44 AM   Specimen: BLOOD  RIGHT FOREARM  Result Value Ref Range Status   Specimen Description BLOOD RIGHT FOREARM  Final   Special Requests   Final    BOTTLES DRAWN AEROBIC AND ANAEROBIC Blood Culture adequate volume   Culture   Final    NO GROWTH 5 DAYS Performed at [facility], [address] [place], [place] [address]    Report Status 08/22/2022 FINAL  Final  Culture, blood (Routine x 2)     Status: None   Collection Time: 08/17/22  9:44 AM   Specimen: BLOOD RIGHT WRIST  Result Value Ref Range Status   Specimen Description BLOOD RIGHT WRIST  Final   Special Requests   Final    BOTTLES DRAWN AEROBIC AND ANAEROBIC Blood Culture results may not be optimal due to an inadequate volume of blood received in culture bottles   Culture   Final    NO GROWTH 5 DAYS Performed at [facility], [address], [place], [place] [address]    Report Status 08/22/2022 FINAL  Final  Aerobic Culture w Gram Stain (superficial specimen)     Status: None   Collection Time: 08/18/22 10:39 AM   Specimen: Wound  Result Value Ref Range Status   Specimen Description   Final    WOUND Performed at [facility], [address] [place], [place] [address]    Special Requests   Final    LEFT TOE Performed at [facility], [address] [place], [place] [address]    Gram Stain   Final    FEW WBC PRESENT, PREDOMINANTLY PMN MODERATE GRAM NEGATIVE RODS FEW GRAM POSITIVE COCCI Performed at [facility], [address], [place], [place] [address]    Culture   Final    MODERATE ENTEROBACTER CLOACAE MODERATE ENTEROCOCCUS FAECALIS    Report Status 08/21/2022 FINAL  Final   Organism ID, Bacteria ENTEROBACTER CLOACAE  Final   Organism ID, Bacteria ENTEROCOCCUS FAECALIS  Final      Susceptibility   Enterobacter cloacae - MIC*    CEFEPIME <=0.12 SENSITIVE Sensitive     CEFTAZIDIME <=1 SENSITIVE Sensitive     CIPROFLOXACIN <=0.25 SENSITIVE Sensitive     GENTAMICIN <=1 SENSITIVE  Sensitive     IMIPENEM 1 SENSITIVE Sensitive     TRIMETH/SULFA >=320 RESISTANT Resistant     PIP/TAZO <=4 SENSITIVE Sensitive     * MODERATE ENTEROBACTER CLOACAE   Enterococcus faecalis - MIC*    AMPICILLIN <=2 SENSITIVE Sensitive     VANCOMYCIN 1 SENSITIVE Sensitive     GENTAMICIN SYNERGY SENSITIVE Sensitive     * MODERATE ENTEROCOCCUS FAECALIS  Aerobic/Anaerobic Culture w Gram Stain (surgical/deep wound)     Status: None (Preliminary result)   Collection Time: 08/23/22  2:43 PM   Specimen: Path fluid; Body Fluid  Result Value Ref Range Status   Specimen Description WOUND LEFT FOOT  Final   Special Requests DRAIN  Final   Gram Stain   Final    NO WBC SEEN NO ORGANISMS SEEN Performed at [facility], [address], [place], [place] [address]    Culture PENDING  Incomplete   Report Status PENDING  Incomplete   Pathology  08/19/22 DIAGNOSIS:  A. FOOT, LEFT TRANSMETATARSAL; TRANSMETATARSAL AMPUTATION:  - SITE OF PRIOR AMPUTATION OF SECOND, THIRD, AND FOURTH TOES WITH SOFT  TISSUE NECROSIS AND ABSCESS FORMATION, FOCALLY INVOLVING THE PROXIMAL  SOFT TISSUE MARGIN.  - ACUTE OSTEOMYELITIS INVOLVING SUBARTICULAR BONE OF TWO UNDESIGNATED  METATARSALS.  - VIABLE PROXIMAL CUT MARGINS OF FIVE SEPARATE, UNDESIGNATED  METATARSALS; NEGATIVE FOR ACUTE OSTEOMYELITIS.   Pertinent Lab.    Latest Ref Rng & Units 08/24/2022    1:58 AM 08/23/2022    2:10 AM 08/22/2022    7:16 AM  CBC  WBC 4.0 - 10.5 K/uL 9.6  9.4  10.6   Hemoglobin 13.0 - 17.0 g/dL 9.0  8.8  8.5   Hematocrit 39.0 - 52.0 % 28.0  27.6  25.9   Platelets 150 - 400 K/uL 354  356  302       Latest Ref Rng & Units 08/23/2022    2:10 AM 08/22/2022    7:16 AM 08/21/2022    5:17 AM  CMP  Glucose 70 - 99 mg/dL 102  725    BUN 8 - 23 mg/dL 21  20    Creatinine 3.66 - 1.24 mg/dL 4.40  3.47  4.25   Sodium 135 - 145 mmol/L 133  135    Potassium 3.5 - 5.1 mmol/L 4.2  3.9    Chloride 98 - 111 mmol/L 103  104    CO2 22 - 32  mmol/L 22  23    Calcium 8.9 - 10.3 mg/dL 8.1  8.2    Total Protein 6.5 - 8.1 g/dL 6.3  6.3    Total Bilirubin 0.3 - 1.2 mg/dL 0.7  0.6    Alkaline Phos 38 - 126 U/L 183  162    AST 15 - 41 U/L 98  157    ALT 0 - 44 U/L 99  119       Pertinent Imaging today Plain films and CT images have been personally visualized and interpreted; radiology reports have been reviewed. Decision making incorporated into the Impression /   ECHOCARDIOGRAM COMPLETE  Result Date: 08/23/2022    ECHOCARDIOGRAM REPORT   Patient Name:   Andrew Harding Date of Exam: 08/21/2022 Medical Rec #:  [id number]   Height:       75.0 in Accession #:    [id number]  Weight:       250.0 lb Date of Birth:  Dec 06, 1950    BSA:          2.413 m Patient Age:    72 years    BP:           132/64 mmHg Patient Gender: M           HR:           87 bpm. Exam Location:  [facility] Procedure: 2D Echo, Cardiac Doppler and Color Doppler Indications:     Atrial Fibrillation I48.91  History:         Patient has no prior history of Echocardiogram examinations.                  Prior CABG; Risk Factors:Hypertension, Dyslipidemia and                  Diabetes.  Sonographer:     Cristela Blue Referring Phys:  [id number] LILY MICHELLE TANG Diagnosing Phys: Alwyn Pea MD IMPRESSIONS  1. Left ventricular ejection fraction, by estimation, is 65 to 70%. The left ventricle has normal function. The left ventricle demonstrates regional wall motion abnormalities (see scoring diagram/findings for description). Left ventricular diastolic parameters were normal.  2. Right ventricular systolic function is normal. The right ventricular size is normal.  3. The mitral valve is normal in structure. No evidence of mitral valve regurgitation.  4. The aortic valve is normal in structure. Aortic valve regurgitation is not visualized. FINDINGS  Left Ventricle: Left ventricular ejection fraction, by estimation, is 65 to 70%. The left ventricle has normal function. The left ventricle demonstrates  regional wall motion abnormalities. The left ventricular internal cavity size was normal in size. There is no left ventricular hypertrophy. Left ventricular diastolic parameters were normal. Right Ventricle: The right ventricular size is normal. No increase in right ventricular wall thickness. Right ventricular systolic function is normal. Left Atrium: Left atrial size was normal in size. Right Atrium: Right atrial size was normal in size. Pericardium: There is no evidence of pericardial effusion. Mitral Valve: The mitral valve is normal in structure. No evidence of mitral valve regurgitation. MV peak gradient, 6.5 mmHg. The mean mitral valve gradient is 3.0 mmHg. Tricuspid Valve: The tricuspid valve is normal in structure. Tricuspid valve regurgitation is trivial. Aortic Valve: The aortic valve is normal in structure. Aortic valve regurgitation is not visualized. Aortic valve mean gradient measures 2.0 mmHg. Aortic valve peak gradient  measures 3.8 mmHg. Aortic valve area, by VTI measures 3.63 cm. Pulmonic Valve: The pulmonic valve was grossly normal. Pulmonic valve regurgitation is not visualized. Aorta: The ascending aorta was not well visualized. IAS/Shunts: No atrial level shunt detected by color flow Doppler.  LEFT VENTRICLE PLAX 2D LVIDd:         4.40 cm LVIDs:         2.70 cm LV PW:         1.00 cm LV IVS:        1.10 cm LVOT diam:     2.20 cm LV SV:         56 LV SV Index:   23 LVOT Area:     3.80 cm  RIGHT VENTRICLE RV Basal diam:  3.30 cm RV Mid diam:    3.70 cm RV S prime:     12.20 cm/s TAPSE (M-mode): 1.4 cm LEFT ATRIUM             Index        RIGHT ATRIUM           Index LA diam:        4.30 cm 1.78 cm/m   RA Area:     19.80 cm LA Vol (A2C):   80.0 ml 33.16 ml/m  RA Volume:   57.90 ml  24.00 ml/m LA Vol (A4C):   80.8 ml 33.49 ml/m LA Biplane Vol: 83.0 ml 34.40 ml/m  AORTIC VALVE AV Area (Vmax):    3.11 cm AV Area (Vmean):   3.41 cm AV Area (VTI):     3.63 cm AV Vmax:           97.45 cm/s AV  Vmean:          70.150 cm/s AV VTI:            0.154 m AV Peak Grad:      3.8 mmHg AV Mean Grad:      2.0 mmHg LVOT Vmax:         79.80 cm/s LVOT Vmean:        63.000 cm/s LVOT VTI:          0.147 m LVOT/AV VTI ratio: 0.95  AORTA Ao Root diam: 3.50 cm MITRAL VALVE                TRICUSPID VALVE MV Area (PHT): 4.63 cm     TR Peak grad:   25.0 mmHg MV Area VTI:   3.12 cm     TR Vmax:        250.00 cm/s MV Peak grad:  6.5 mmHg MV Mean grad:  3.0 mmHg     SHUNTS MV Vmax:       1.27 m/s     Systemic VTI:  0.15 m MV Vmean:      71.7 cm/s    Systemic Diam: 2.20 cm MV Decel Time: 164 msec MV E velocity: 127.00 cm/s Alwyn Pea MD Electronically signed by Alwyn Pea MD Signature Date/Time: 08/23/2022/7:30:34 AM    Final    PERIPHERAL VASCULAR CATHETERIZATION  Result Date: 08/22/2022 See surgical note for result.  DG Foot Complete Left  Result Date: 08/19/2022 CLINICAL DATA:  Postop. EXAM: LEFT FOOT - COMPLETE 3+ VIEW COMPARISON:  Preoperative exam earlier today FINDINGS: Interval transmetatarsal amputation of all 5 rays. Presumed antibiotic beads projecting over the mid metatarsals as well as the ankle at the level of the medial malleolus. Overlying skin staples in place. IMPRESSION: Interval  transmetatarsal amputation of all 5 rays. Presumed antibiotic beads projecting over the mid metatarsals and medial ankle. Electronically Signed   By: Narda Rutherford M.D.   On: 08/19/2022 16:48   DG Foot 2 Views Left  Result Date: 08/19/2022 CLINICAL DATA:  Infection, patient reports recent surgery. EXAM: LEFT FOOT - 2 VIEW COMPARISON:  Foot MRI yesterday FINDINGS: Prior amputation of the second and third toes as well as fourth toe at the proximal aspect of the proximal phalanx. Focal defect involving the residual fourth toe proximal phalanx as seen on prior MRI. No definite radiographic correlate to the edematous changes in the second and third metatarsal heads on MRI. No evidence of acute fracture. Soft  tissue edema is seen over the dorsum of the forefoot. Vascular calcifications. IMPRESSION: 1. Prior amputation of the second and third toes as well as fourth toe at the proximal phalanx. Small defect in the fourth toe proximal phalanx corresponding to MRI findings. No radiographic correlate to the marrow edema changes in the second and third metatarsal heads on MRI. 2. Forefoot soft tissue edema. Electronically Signed   By: Narda Rutherford M.D.   On: 08/19/2022 16:45   MR FOOT LEFT WO CONTRAST  Result Date: 08/18/2022 CLINICAL DATA:  Foot swelling, diabetic, osteomyelitis suspected, xray done EXAM: MRI OF THE LEFT FOOT WITHOUT CONTRAST TECHNIQUE: Multiplanar, multisequence MR imaging of the left forefoot was performed. No intravenous contrast was administered. COMPARISON:  None Available. FINDINGS: Bones/Joint/Cartilage Postsurgical changes of amputation of the second and third toes at the metatarsophalangeal joint level. Prior fourth toe amputation through the base of the proximal phalanx. Surgical defect or focal erosion involving the distal aspect of the residual fourth toe proximal phalanx (series 9, image 14). There is periosteal edema of the second and third metatarsal heads with relatively mild bone marrow edema. Prominent bone marrow edema within the first and third metatarsal diaphyses and at the proximal metaphysis of the second metatarsal. No well-defined fracture line. Normal alignment. Relatively mild degenerative changes. Ligaments Intact Lisfranc ligament. Remaining collateral ligaments are intact. Muscles and Tendons Chronic denervation changes of the foot musculature. Amputation changes of the musculotendinous structures of the second through fourth toes. No tenosynovitis. Soft tissues Soft tissue wound or ulceration at the dorsal aspect of the forefoot overlying the second metatarsal head. Additional shallow wounds or ulcerations dorsal to the third and fourth metatarsal heads. Extensive soft  tissue edema. No organized or drainable fluid collections. IMPRESSION: 1. Postsurgical changes of amputation of the second through fourth toes. Soft tissue wounds or ulcerations at the dorsal aspect of the forefoot overlying the second metatarsal head. Additional shallow wounds or ulcerations dorsal to the third and fourth metatarsal heads. 2. Small postsurgical defect or focal erosion involving the distal aspect of the residual fourth toe proximal phalanx. Appearance is suspicious for osteomyelitis. 3. Periosteal edema of the second and third metatarsal heads with relatively mild bone marrow edema. Findings are suspicious for early acute osteomyelitis. 4. Prominent bone marrow edema within the first and third metatarsal diaphyses and at the proximal metaphysis of the second metatarsal. No well-defined fracture line. Findings are favored to represent stress-related changes. 5. Extensive soft tissue edema. No organized or drainable fluid collections. Electronically Signed   By: Duanne Guess D.O.   On: 08/18/2022 20:24   CT Angio Chest PE W and/or Wo Contrast  Result Date: 08/17/2022 CLINICAL DATA:  Sepsis. EXAM: CT ANGIOGRAPHY CHEST WITH CONTRAST TECHNIQUE: Multidetector CT imaging of the chest was performed using the  standard protocol during bolus administration of intravenous contrast. Multiplanar CT image reconstructions and MIPs were obtained to evaluate the vascular anatomy. RADIATION DOSE REDUCTION: This exam was performed according to the departmental dose-optimization program which includes automated exposure control, adjustment of the mA and/or kV according to patient size and/or use of iterative reconstruction technique. CONTRAST:  60mL OMNIPAQUE IOHEXOL 350 MG/ML SOLN COMPARISON:  Chest x-ray 08/17/2022 earlier FINDINGS: Cardiovascular: No pulmonary embolism identified. Status post median sternotomy. Heart is nonenlarged. No significant pericardial effusion. The thoracic aorta has a normal course  and caliber with mild atherosclerotic calcified plaque. Coronary artery calcifications are noted. 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 Mediastinum/Nodes: Mildly patulous esophagus. No specific abnormal lymph node enlargement seen in the axillary region, hilum or mediastinum. Lungs/Pleura: Mild diffuse breathing motion. No consolidation, pneumothorax or effusion. Dependent atelectasis. 3 mm nodule right upper lobe on series 5, image 77 is noncalcified. Similar focus on image 56 but this has a component of calcification peripherally. Upper Abdomen: Gallstones in the nondilated gallbladder. There is dystrophic calcification along the left adrenal gland. Please correlate with any previous infectious, inflammatory or traumatic process. This was seen on the CT scan of 2014 of the abdomen and pelvis. Musculoskeletal: Mild degenerative changes along the spine. Review of the MIP images confirms the above findings. IMPRESSION: No pulmonary embolism identified.  Mild breathing motion. Postop chest. Gallstones. 3 mm lung nodules. No follow-up needed if patient is low-risk (and has no known or suspected primary neoplasm). Non-contrast chest CT can be considered in 12 months if patient is high-risk. This recommendation follows the consensus statement: Guidelines for Management of Incidental Pulmonary Nodules Detected on CT Images: From the Fleischner Society 2017; Radiology 2017; 284:228-243. Aortic Atherosclerosis (ICD10-I70.0). Electronically Signed   By: Karen Kays M.D.   On: 08/17/2022 13:43   CT HEAD WO CONTRAST ( )  Result Date: 08/17/2022 CLINICAL DATA:  Possible wound infection to left foot EXAM: CT HEAD WITHOUT CONTRAST TECHNIQUE: Contiguous axial images were obtained from the base of the skull through the vertex without intravenous contrast. RADIATION DOSE REDUCTION: This exam was performed according to the departmental dose-optimization program which includes automated exposure control,  adjustment of the mA and/or kV according to patient size and/or use of iterative reconstruction technique. COMPARISON:  None Available. FINDINGS: Brain: No evidence of acute infarction, hemorrhage, mass, mass effect, or midline shift. No hydrocephalus or extra-axial fluid collection. Vascular: No hyperdense vessel. Atherosclerotic calcifications in the intracranial carotid and vertebral arteries. Skull: Negative for fracture or focal lesion. Sinuses/Orbits: Mucosal thickening in the ethmoid air cells and left maxillary sinus. No acute finding in the orbits. Status post bilateral lens replacements. Other: The mastoid air cells are well aerated. IMPRESSION: No acute intracranial process. Electronically Signed   By: Wiliam Ke M.D.   On: 08/17/2022 13:35   DG Chest 2 View  Result Date: 08/17/2022 CLINICAL DATA:  Sepsis EXAM: CHEST - 2 VIEW COMPARISON:  CXR 07/19/12 FINDINGS: Status median sternotomy and CABG. No pleural effusion. No pneumothorax. Normal cardiac and mediastinal contours. No focal airspace opacity. No radiographically apparent displaced rib fractures. Vertebral body heights are visualized upper abdomen is unremarkable IMPRESSION: No focal airspace opacity Electronically Signed   By: Lorenza Cambridge M.D.   On: 08/17/2022 10:20   DG MINI C-ARM IMAGE ONLY  Result Date: 08/11/2022 There is no interpretation for this exam.  This order is for images obtained during a surgical procedure.  Please See "Surgeries" Tab for more information  regarding the procedure.    I have personally spent at least 50 minutes involved in face-to-face and non-face-to-face activities for this patient on the day of the visit. Professional time spent includes the following activities: Preparing to see the patient (review of tests), Obtaining and/or reviewing separately obtained history (admission/discharge record), Performing a medically appropriate examination and/or evaluation , Ordering medications/tests/procedures,  referring and communicating with other health care professionals, Documenting clinical information in the EMR, Independently interpreting results (not separately reported), Communicating results to the patient/family/caregiver, Counseling and educating the patient/family/caregiver and Care coordination (not separately reported).   Electronically signed by:   Odette Fraction, MD Infectious Disease Physician [facility] Pager: [phone]

## 2022-08-24 NOTE — Progress Notes (Addendum)
IP rehab admissions - I received a message from SW that patient would like CIR here at [facility].  Awaiting PT/OT updates prior to requesting insurance authorization for CIR.  Will follow up once PT/OT updates are on file.  Call for questions. [phone]  We will open the case now with [employer] and [employer] and request acute inpatient rehab admission.

## 2022-08-24 NOTE — Progress Notes (Signed)
[facility] CARDIOLOGY CONSULT NOTE       Patient ID: Andrew Harding MRN: [id number] DOB/AGE: 73/29/1952 73 y.o.  Admit date: 08/17/2022 Referring Physician Dr. Enedina Finner Primary Physician Dr. Daniel Nones  Primary Cardiologist previous Dr. Gwen Pounds  Reason for Consultation new AF  HPI: Andrew Harding is a 72yoM with a PMH of CAD s/p CABG x 4 (2014, [facility]), PAD, DM2, who presented to [facility] ED 08/17/2022 from his podiatrist office worsening swelling and erythema to his left foot after a thermal injury.  He is now s/p Left TMA x 3 on 4/20 with podiatry with ongoing treatment for cellulitis and wound necrosis. S/p left LE angiogram and angioplasty on 4/23 with vascular surgery.  Went back to the OR with podiatry on 4/24 for I&D. Cardiology is following for new onset paroxysmal AF discovered on admission EKG. Atrial fibrillation has been rate controlled throughout admission.  Interval History:  -No acute events -Eager for hospital discharge and for follow-up visit with podiatry today -Remains without chest pain, shortness of breath, heart racing or palpitations. -In atrial fibrillation with rate well-controlled in the 70s to 80s on telemetry  Review of systems complete and found to be negative unless listed above     Past Medical History:  Diagnosis Date   Bladder neck obstruction    Chronic kidney disease    Coronary artery disease    a.) s/p 4v CABG in 2014   Diabetes mellitus without complication    Diabetic neuropathy    Diabetic peripheral neuropathy    Diverticulosis    Gout    Heart murmur    Hypercholesteremia    Hyperlipidemia    Hypertension    Peripheral neuropathy    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   Tubular adenoma    Vitamin D deficiency     Past Surgical History:  Procedure Laterality Date   AMPUTATION Left 08/19/2022   Procedure: TRANSMETATARSAL AMPUTATION LEFT FOOT WITH IRRIGATION AND DEBRIDEMENT;  Surgeon: Rosetta Posner, DPM;  Location: [facility] ORS;  Service: Podiatry;   Laterality: Left;   AMPUTATION TOE Right 06/01/2015   Procedure: AMPUTATION TOE;  Surgeon: Recardo Evangelist, DPM;  Location: [facility] ORS;  Service: Podiatry;  Laterality: Right;   AMPUTATION TOE Left 08/11/2022   Procedure: AMPUTATION TOE 2, 3, 4;  Surgeon: [name], DPM;  Location: [facility] ORS;  Service: Podiatry;  Laterality: Left;   CATARACT EXTRACTION, BILATERAL     CIRCUMCISION N/A 06/12/2022   Procedure: CIRCUMCISION ADULT;  Surgeon: Vanna Scotland, MD;  Location: [facility] ORS;  Service: Urology;  Laterality: N/A;   COLONOSCOPY WITH PROPOFOL N/A 02/14/2016   Procedure: COLONOSCOPY WITH PROPOFOL;  Surgeon: Christena Deem, MD;  Location: [facility] END[facility]COPY;  Service: Endoscopy;  Laterality: N/A;   COLONOSCOPY WITH PROPOFOL N/A 01/07/2019   Procedure: COLONOSCOPY WITH PROPOFOL;  Surgeon: Christena Deem, MD;  Location: [facility] ENDOSCOPY;  Service: Endoscopy;  Laterality: N/A;   CORONARY ARTERY BYPASS GRAFT N/A 08/2012   EXCISION PARTIAL PHALANX Right 06/01/2015   Procedure: EXCISION PARTIAL PHALANX /  BONE;  Surgeon: Recardo Evangelist, DPM;  Location: [facility] ORS;  Service: Podiatry;  Laterality: Right;   FLEXIBLE SIGMOIDOSCOPY N/A 05/29/2016   Procedure: FLEXIBLE SIGMOIDOSCOPY;  Surgeon: Christena Deem, MD;  Location: [facility] ENDOSCOPY;  Service: Endoscopy;  Laterality: N/A;   KNEE ARTHROSCOPY Left    LOWER EXTREMITY ANGIOGRAPHY Left 08/22/2022   Procedure: Lower Extremity Angiography;  Surgeon: Renford Dills, MD;  Location: [facility] INVASIVE CV LAB;  Service: Cardiovascular;  Laterality: Left;    Medications Prior to Admission  Medication Sig Dispense Refill Last Dose   aspirin EC 81 MG tablet Take 81 mg by mouth daily.   08/17/2022   betamethasone dipropionate 0.05 % cream Apply topically 2 (two) times daily. 30 g 0 Past Week   [EXPIRED] cefdinir (OMNICEF) 300 MG capsule Take 300 mg by mouth 2 (two) times daily.   08/17/2022   cholecalciferol (VITAMIN D) 1000 units tablet Take 2,000 Units by mouth  daily.   08/17/2022   gabapentin (NEURONTIN) 300 MG capsule Take 300 mg by mouth 3 (three) times daily.   08/17/2022   insulin glargine (LANTUS) 100 UNIT/ML injection Inject 52 Units into the skin at bedtime.   08/16/2022   lisinopril (ZESTRIL) 20 MG tablet Take 20 mg by mouth daily.   08/17/2022   metFORMIN (GLUCOPHAGE-XR) 500 MG 24 hr tablet 500 mg 2 (two) times daily with a meal.   08/17/2022   NOVOLOG FLEXPEN 100 UNIT/ML FlexPen Inject 15-20 Units into the skin 3 (three) times daily with meals.   08/17/2022   pravastatin (PRAVACHOL) 80 MG tablet TAKE ONE TABLET BY MOUTH AT NIGHT   08/16/2022   silver sulfADIAZINE (SILVADENE) 1 % cream Apply topically.   Past Week   traZODone (DESYREL) 50 MG tablet TAKE ONE TABLET AT BEDTIME AS NEEDED   08/16/2022   TRULICITY 4.5 MG/0.5ML SOPN Inject into the skin.   08/13/2022   glucose blood (ONETOUCH ULTRA) test strip Use 3 (three) times daily      Insulin Pen Needle (FIFTY50 PEN NEEDLES) 32G X 4 MM MISC USE 4 TIMES DAILY      Social History   Socioeconomic History   Marital status: Married    Spouse name: Andrew Harding,Andrew Harding   Number of children: Not on file   Years of education: Not on file   Highest education level: Not on file  Occupational History   Not on file  Tobacco Use   Smoking status: Some Days    Types: Cigars   Smokeless tobacco: Never  Vaping Use   Vaping Use: Never used  Substance and Sexual Activity   Alcohol use: No   Drug use: No   Sexual activity: Never  Other Topics Concern   Not on file  Social History Narrative   Lives at home by himself. Independent at baseline   Social Determinants of Health   Financial Resource Strain: Not on file  Food Insecurity: No Food Insecurity (08/19/2022)   Hunger Vital Sign    Worried About Running Out of Food in the Last Year: Never true    Ran Out of Food in the Last Year: Never true  Transportation Needs: No Transportation Needs (08/19/2022)   PRAPARE - Scientist, research (physical sciences) (Medical): No    Lack of Transportation (Non-Medical): No  Physical Activity: Not on file  Stress: Not on file  Social Connections: Not on file  Intimate Partner Violence: Not At Risk (08/19/2022)   Humiliation, Afraid, Rape, and Kick questionnaire    Fear of Current or Ex-Partner: No    Emotionally Abused: No    Physically Abused: No    Sexually Abused: No    Family History  Problem Relation Age of Onset   Diabetes Mellitus II Mother    CAD Mother       Intake/Output Summary (Last 24 hours) at 08/24/2022 1610 Last data filed at 08/24/2022 0622 Gross per 24 hour  Intake 747.4 ml  Output  1020 ml  Net -272.6 ml     Vitals:   08/23/22 1733 08/23/22 2000 08/24/22 0600 08/24/22 0814  BP: (!) 114/57 114/60 (!) 100/57 130/73  Pulse: 94 86 65 78  Resp: 20 18 16    Temp: 97.8 F (36.6 Harding) 97.9 F (36.6 Harding) 98.1 F (36.7 Harding)   TempSrc:  Oral Oral   SpO2: 97% 100% 97% 98%  Weight:      Height:        PHYSICAL EXAM General: Pleasant Caucasian male , well nourished, in no acute distress. Sitting in bed in shared room on 2C, no family present HEENT:  Normocephalic and atraumatic. Neck:  No JVD.  Lungs: Normal respiratory effort on room air. Clear bilaterally to auscultation. No wheezes, crackles, rhonchi.  Heart: irregular irregular with controlled rate  . Normal S1 and S2 without gallops or murmurs.  Abdomen: Non-distended appearing.  Msk: Normal strength and tone for age. Extremities: L foot with ace bandage in place, trace edema to the left. R groin with clean gauze and tegaderm over arteriotomy, no significant tenderness to palpation, ecchymosis, or apparent aneurysm Neuro: Alert and oriented X 3. Psych:  Answers questions appropriately.   Labs: Basic Metabolic Panel: Recent Labs    08/22/22 0716 08/23/22 0210  NA 135 133*  K 3.9 4.2  CL 104 103  CO2 23 22  GLUCOSE 113* 242*  BUN 20 21  CREATININE 1.26* 1.27*  CALCIUM 8.2* 8.1*    Liver Function  Tests: Recent Labs    08/22/22 0716 08/23/22 0210  AST 157* 98*  ALT 119* 99*  ALKPHOS 162* 183*  BILITOT 0.6 0.7  PROT 6.3* 6.3*  ALBUMIN 2.2* 2.2*    No results for input(s): "LIPASE", "AMYLASE" in the last 72 hours. CBC: Recent Labs    08/23/22 0210 08/24/22 0158  WBC 9.4 9.6  HGB 8.8* 9.0*  HCT 27.6* 28.0*  MCV 94.5 94.0  PLT 356 354    Cardiac Enzymes: No results for input(s): "CKTOTAL", "CKMB", "CKMBINDEX", "TROPONINIHS" in the last 72 hours. BNP: No results for input(s): "BNP" in the last 72 hours. D-Dimer: No results for input(s): "DDIMER" in the last 72 hours. Hemoglobin A1C: No results for input(s): "HGBA1C" in the last 72 hours. Fasting Lipid Panel: No results for input(s): "CHOL", "HDL", "LDLCALC", "TRIG", "CHOLHDL", "LDLDIRECT" in the last 72 hours. Thyroid Function Tests: No results for input(s): "TSH", "T4TOTAL", "T3FREE", "THYROIDAB" in the last 72 hours.  Invalid input(s): "FREET3" Anemia Panel: No results for input(s): "VITAMINB12", "FOLATE", "FERRITIN", "TIBC", "IRON", "RETICCTPCT" in the last 72 hours.   Radiology: ECHOCARDIOGRAM COMPLETE  Result Date: 08/23/2022    ECHOCARDIOGRAM REPORT   Patient Name:   Andrew Harding Date of Exam: 08/21/2022 Medical Rec #:  [id number]   Height:       75.0 in Accession #:    [id number]  Weight:       250.0 lb Date of Birth:  02-17-1951    BSA:          2.413 m Patient Age:    72 years    BP:           132/64 mmHg Patient Gender: M           HR:           87 bpm. Exam Location:  [facility] Procedure: 2D Echo, Cardiac Doppler and Color Doppler Indications:     Atrial Fibrillation I48.91  History:         Patient  has no prior history of Echocardiogram examinations.                  Prior CABG; Risk Factors:Hypertension, Dyslipidemia and                  Diabetes.  Sonographer:     Cristela Blue Referring Phys:  [id number] Quirino Kakos MICHELLE Landa Mullinax Diagnosing Phys: Alwyn Pea MD IMPRESSIONS  1. Left ventricular ejection fraction, by  estimation, is 65 to 70%. The left ventricle has normal function. The left ventricle demonstrates regional wall motion abnormalities (see scoring diagram/findings for description). Left ventricular diastolic parameters were normal.  2. Right ventricular systolic function is normal. The right ventricular size is normal.  3. The mitral valve is normal in structure. No evidence of mitral valve regurgitation.  4. The aortic valve is normal in structure. Aortic valve regurgitation is not visualized. FINDINGS  Left Ventricle: Left ventricular ejection fraction, by estimation, is 65 to 70%. The left ventricle has normal function. The left ventricle demonstrates regional wall motion abnormalities. The left ventricular internal cavity size was normal in size. There is no left ventricular hypertrophy. Left ventricular diastolic parameters were normal. Right Ventricle: The right ventricular size is normal. No increase in right ventricular wall thickness. Right ventricular systolic function is normal. Left Atrium: Left atrial size was normal in size. Right Atrium: Right atrial size was normal in size. Pericardium: There is no evidence of pericardial effusion. Mitral Valve: The mitral valve is normal in structure. No evidence of mitral valve regurgitation. MV peak gradient, 6.5 mmHg. The mean mitral valve gradient is 3.0 mmHg. Tricuspid Valve: The tricuspid valve is normal in structure. Tricuspid valve regurgitation is trivial. Aortic Valve: The aortic valve is normal in structure. Aortic valve regurgitation is not visualized. Aortic valve mean gradient measures 2.0 mmHg. Aortic valve peak gradient measures 3.8 mmHg. Aortic valve area, by VTI measures 3.63 cm. Pulmonic Valve: The pulmonic valve was grossly normal. Pulmonic valve regurgitation is not visualized. Aorta: The ascending aorta was not well visualized. IAS/Shunts: No atrial level shunt detected by color flow Doppler.  LEFT VENTRICLE PLAX 2D LVIDd:         4.40 cm  LVIDs:         2.70 cm LV PW:         1.00 cm LV IVS:        1.10 cm LVOT diam:     2.20 cm LV SV:         56 LV SV Index:   23 LVOT Area:     3.80 cm  RIGHT VENTRICLE RV Basal diam:  3.30 cm RV Mid diam:    3.70 cm RV S prime:     12.20 cm/s TAPSE (M-mode): 1.4 cm LEFT ATRIUM             Index        RIGHT ATRIUM           Index LA diam:        4.30 cm 1.78 cm/m   RA Area:     19.80 cm LA Vol (A2C):   80.0 ml 33.16 ml/m  RA Volume:   57.90 ml  24.00 ml/m LA Vol (A4C):   80.8 ml 33.49 ml/m LA Biplane Vol: 83.0 ml 34.40 ml/m  AORTIC VALVE AV Area (Vmax):    3.11 cm AV Area (Vmean):   3.41 cm AV Area (VTI):     3.63 cm AV Vmax:  97.45 cm/s AV Vmean:          70.150 cm/s AV VTI:            0.154 m AV Peak Grad:      3.8 mmHg AV Mean Grad:      2.0 mmHg LVOT Vmax:         79.80 cm/s LVOT Vmean:        63.000 cm/s LVOT VTI:          0.147 m LVOT/AV VTI ratio: 0.95  AORTA Ao Root diam: 3.50 cm MITRAL VALVE                TRICUSPID VALVE MV Area (PHT): 4.63 cm     TR Peak grad:   25.0 mmHg MV Area VTI:   3.12 cm     TR Vmax:        250.00 cm/s MV Peak grad:  6.5 mmHg MV Mean grad:  3.0 mmHg     SHUNTS MV Vmax:       1.27 m/s     Systemic VTI:  0.15 m MV Vmean:      71.7 cm/s    Systemic Diam: 2.20 cm MV Decel Time: 164 msec MV E velocity: 127.00 cm/s Alwyn Pea MD Electronically signed by Alwyn Pea MD Signature Date/Time: 08/23/2022/7:30:34 AM    Final    PERIPHERAL VASCULAR CATHETERIZATION  Result Date: 08/22/2022 See surgical note for result.  DG Foot Complete Left  Result Date: 08/19/2022 CLINICAL DATA:  Postop. EXAM: LEFT FOOT - COMPLETE 3+ VIEW COMPARISON:  Preoperative exam earlier today FINDINGS: Interval transmetatarsal amputation of all 5 rays. Presumed antibiotic beads projecting over the mid metatarsals as well as the ankle at the level of the medial malleolus. Overlying skin staples in place. IMPRESSION: Interval transmetatarsal amputation of all 5 rays. Presumed  antibiotic beads projecting over the mid metatarsals and medial ankle. Electronically Signed   By: Narda Rutherford M.D.   On: 08/19/2022 16:48   DG Foot 2 Views Left  Result Date: 08/19/2022 CLINICAL DATA:  Infection, patient reports recent surgery. EXAM: LEFT FOOT - 2 VIEW COMPARISON:  Foot MRI yesterday FINDINGS: Prior amputation of the second and third toes as well as fourth toe at the proximal aspect of the proximal phalanx. Focal defect involving the residual fourth toe proximal phalanx as seen on prior MRI. No definite radiographic correlate to the edematous changes in the second and third metatarsal heads on MRI. No evidence of acute fracture. Soft tissue edema is seen over the dorsum of the forefoot. Vascular calcifications. IMPRESSION: 1. Prior amputation of the second and third toes as well as fourth toe at the proximal phalanx. Small defect in the fourth toe proximal phalanx corresponding to MRI findings. No radiographic correlate to the marrow edema changes in the second and third metatarsal heads on MRI. 2. Forefoot soft tissue edema. Electronically Signed   By: Narda Rutherford M.D.   On: 08/19/2022 16:45   MR FOOT LEFT WO CONTRAST  Result Date: 08/18/2022 CLINICAL DATA:  Foot swelling, diabetic, osteomyelitis suspected, xray done EXAM: MRI OF THE LEFT FOOT WITHOUT CONTRAST TECHNIQUE: Multiplanar, multisequence MR imaging of the left forefoot was performed. No intravenous contrast was administered. COMPARISON:  None Available. FINDINGS: Bones/Joint/Cartilage Postsurgical changes of amputation of the second and third toes at the metatarsophalangeal joint level. Prior fourth toe amputation through the base of the proximal phalanx. Surgical defect or focal erosion involving the distal aspect of the residual  fourth toe proximal phalanx (series 9, image 14). There is periosteal edema of the second and third metatarsal heads with relatively mild bone marrow edema. Prominent bone marrow edema within  the first and third metatarsal diaphyses and at the proximal metaphysis of the second metatarsal. No well-defined fracture line. Normal alignment. Relatively mild degenerative changes. Ligaments Intact Lisfranc ligament. Remaining collateral ligaments are intact. Muscles and Tendons Chronic denervation changes of the foot musculature. Amputation changes of the musculotendinous structures of the second through fourth toes. No tenosynovitis. Soft tissues Soft tissue wound or ulceration at the dorsal aspect of the forefoot overlying the second metatarsal head. Additional shallow wounds or ulcerations dorsal to the third and fourth metatarsal heads. Extensive soft tissue edema. No organized or drainable fluid collections. IMPRESSION: 1. Postsurgical changes of amputation of the second through fourth toes. Soft tissue wounds or ulcerations at the dorsal aspect of the forefoot overlying the second metatarsal head. Additional shallow wounds or ulcerations dorsal to the third and fourth metatarsal heads. 2. Small postsurgical defect or focal erosion involving the distal aspect of the residual fourth toe proximal phalanx. Appearance is suspicious for osteomyelitis. 3. Periosteal edema of the second and third metatarsal heads with relatively mild bone marrow edema. Findings are suspicious for early acute osteomyelitis. 4. Prominent bone marrow edema within the first and third metatarsal diaphyses and at the proximal metaphysis of the second metatarsal. No well-defined fracture line. Findings are favored to represent stress-related changes. 5. Extensive soft tissue edema. No organized or drainable fluid collections. Electronically Signed   By: Duanne Guess D.O.   On: 08/18/2022 20:24   CT Angio Chest PE W and/or Wo Contrast  Result Date: 08/17/2022 CLINICAL DATA:  Sepsis. EXAM: CT ANGIOGRAPHY CHEST WITH CONTRAST TECHNIQUE: Multidetector CT imaging of the chest was performed using the standard protocol during bolus  administration of intravenous contrast. Multiplanar CT image reconstructions and MIPs were obtained to evaluate the vascular anatomy. RADIATION DOSE REDUCTION: This exam was performed according to the departmental dose-optimization program which includes automated exposure control, adjustment of the mA and/or kV according to patient size and/or use of iterative reconstruction technique. CONTRAST:  60mL OMNIPAQUE IOHEXOL 350 MG/ML SOLN COMPARISON:  Chest x-ray 08/17/2022 earlier FINDINGS: Cardiovascular: No pulmonary embolism identified. Status post median sternotomy. Heart is nonenlarged. No significant pericardial effusion. The thoracic aorta has a normal course and caliber with mild atherosclerotic calcified plaque. Coronary artery calcifications are noted. 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 Mediastinum/Nodes: Mildly patulous esophagus. No specific abnormal lymph node enlargement seen in the axillary region, hilum or mediastinum. Lungs/Pleura: Mild diffuse breathing motion. No consolidation, pneumothorax or effusion. Dependent atelectasis. 3 mm nodule right upper lobe on series 5, image 77 is noncalcified. Similar focus on image 56 but this has a component of calcification peripherally. Upper Abdomen: Gallstones in the nondilated gallbladder. There is dystrophic calcification along the left adrenal gland. Please correlate with any previous infectious, inflammatory or traumatic process. This was seen on the CT scan of 2014 of the abdomen and pelvis. Musculoskeletal: Mild degenerative changes along the spine. Review of the MIP images confirms the above findings. IMPRESSION: No pulmonary embolism identified.  Mild breathing motion. Postop chest. Gallstones. 3 mm lung nodules. No follow-up needed if patient is low-risk (and has no known or suspected primary neoplasm). Non-contrast chest CT can be considered in 12 months if patient is high-risk. This recommendation follows the consensus  statement: Guidelines for Management of Incidental Pulmonary Nodules Detected on CT  Images: From the Fleischner Society 2017; Radiology 2017; [phone]. Aortic Atherosclerosis (ICD10-I70.0). Electronically Signed   By: Karen Kays M.D.   On: 08/17/2022 13:43   CT HEAD WO CONTRAST ( )  Result Date: 08/17/2022 CLINICAL DATA:  Possible wound infection to left foot EXAM: CT HEAD WITHOUT CONTRAST TECHNIQUE: Contiguous axial images were obtained from the base of the skull through the vertex without intravenous contrast. RADIATION DOSE REDUCTION: This exam was performed according to the departmental dose-optimization program which includes automated exposure control, adjustment of the mA and/or kV according to patient size and/or use of iterative reconstruction technique. COMPARISON:  None Available. FINDINGS: Brain: No evidence of acute infarction, hemorrhage, mass, mass effect, or midline shift. No hydrocephalus or extra-axial fluid collection. Vascular: No hyperdense vessel. Atherosclerotic calcifications in the intracranial carotid and vertebral arteries. Skull: Negative for fracture or focal lesion. Sinuses/Orbits: Mucosal thickening in the ethmoid air cells and left maxillary sinus. No acute finding in the orbits. Status post bilateral lens replacements. Other: The mastoid air cells are well aerated. IMPRESSION: No acute intracranial process. Electronically Signed   By: Wiliam Ke M.D.   On: 08/17/2022 13:35   DG Chest 2 View  Result Date: 08/17/2022 CLINICAL DATA:  Sepsis EXAM: CHEST - 2 VIEW COMPARISON:  CXR 07/19/12 FINDINGS: Status median sternotomy and CABG. No pleural effusion. No pneumothorax. Normal cardiac and mediastinal contours. No focal airspace opacity. No radiographically apparent displaced rib fractures. Vertebral body heights are visualized upper abdomen is unremarkable IMPRESSION: No focal airspace opacity Electronically Signed   By: Lorenza Cambridge M.D.   On: 08/17/2022 10:20   DG  MINI Harding-ARM IMAGE ONLY  Result Date: 08/11/2022 There is no interpretation for this exam.  This order is for images obtained during a surgical procedure.  Please See "Surgeries" Tab for more information regarding the procedure.    ECHO 09/19/2012 INTERPRETATION ---------------------------------------------------------------   NORMAL LEFT VENTRICULAR SYSTOLIC FUNCTION   NORMAL RIGHT VENTRICULAR SYSTOLIC FUNCTION   VALVULAR REGURGITATION: TRIVIAL TR   NO VALVULAR STENOSIS   DEFINITY IMAGING AGENT USED   NO PRIOR FOR COMPARISON   (Report version 3.0)                    Interpreted and Electronically signed  Perform. by: Sarina Ill, RDCS               by: Chip Boer, MD  Resp.Person: Sarina Ill, RDCS               On: 09/19/2012 11:58:55  Resp. Staff: Hollice Espy, RN Exam End: 09/19/12 09:57    TELEMETRY reviewed by me (LT) 08/24/2022 : AF rate 70s to 80s  EKG reviewed by me: AF 93bpm   Data reviewed by me (LT) 08/24/2022: hospitalist progress note, vascular surgery note, ID note, podiatry note, PT/OT notes, last 24h vitals tele labs imaging I/O    Principal Problem:   Sepsis Active Problems:   Type 2 diabetes mellitus with stage 3b chronic kidney disease, with long-term current use of insulin   Cellulitis   Hypotension due to hypovolemia   Infection of left foot   Atrial fibrillation   ASSESSMENT AND PLAN:  Andrew Leep. Penny is a 72yoM with a PMH of CAD s/p CABG x 4 (2014, [facility]), PAD, DM2, who presented to [facility] ED 08/17/2022 from his podiatrist office worsening swelling and erythema to his left foot.  He is now s/p Left TMA x 3 on 4/20 with podiatry with  ongoing treatment for cellulitis and wound necrosis. Vascular surgery planning LE angiogram on 4/23. Cardiology is following for new onset paroxysmal AF discovered on admission EKG.   # sepsis 2/2 L foot cellulitis and wound necrosis  # Severe PAD  -Agree with current therapy per primary team, podiatry, and  vascular surgery  # new onset paroxysmal AF  Discovered on initial EKG, new in onset. Possibly provoked in the setting of sepsis/infection. Remains predominantly rate controlled on tele today in the 70-80s asymptomatic. -Continuous monitoring on telemetry while inpatient -Continue metoprolol tartrate to 25mg  twice daily for rate control -heparin infusion changed to eliquis 5mg  BID today to take with + Plavix, likely indefinitely.  CHADSVASC 4 (age, HTN, CAD/PAD, DM2) -TSH within normal limits -Echo complete with normal biventricular function, EF 65-70% - discussed rate vs rhythm control strategy with patient & his wife. Will consider rhythm control with amiodarone (if transaminitis resolves) on an outpatient basis plus ? DCCV after 4 weeks of appropriate anticoagulation if he remains in atrial fibrillation - will arrange for follow up with Dr. Tiajuana Amass at [facility] 1-2 weeks after discharge  # transaminitis LFTs continue to downtrending -- AST ALT 157/119, 98/99, 49/69 respectively today - hepatitis panel pending - holding statin for now, resume after hospital follow-up.  # CAD s/p CABG x 4 without chest pain - agree with plavix monotherapy + eliquis 5mg  BID  - hold pravastatin 80 daily with transaminitis  - hold lisinopril 20 mg for now, likely restart at discharge - lipid panel with TC 92, HDL 17, LDL at goal 54, triglycerides 104   Cardiology will sign off. Please haiku with questions or re-engage if needed.    This patient's plan of care was discussed and created with Dr. Juliann Pares and he is in agreement.  Rebeca Allegra , PA-Harding 08/24/22, 12:54pm  [facility]

## 2022-08-24 NOTE — Progress Notes (Signed)
PROGRESS NOTE    Andrew Harding  ZOX:[id number] DOB: 08-Mar-1951 DOA: 08/17/2022 PCP: Lynnea Ferrier, MD   Brief Narrative:  72 year old with history of poorly controlled insulin-dependent DM 2, peripheral neuropathy, HLD, HTN, CAD status post CABG sent to the hospital from Dr. Irene Limbo office due to worsening left foot swelling and erythema.  Patient also had a syncopal episode.  Patient underwent left lower extremity angiogram by vascular on 4/23.  Successful recannulization was performed with improvement in blood flows.  Infectious disease is currently following, antibiotics per their recommendations.  Patient underwent I&D of multiple sites of left foot and left ankle by podiatry, cultures were sent.   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  ID, vascular and podiatry following - Empiric antibiotics- Meropenem -Underwent LLE angiogram by vascular 4/23, successful recannulization and improvement in blood flows -Status post I&D by podiatry on 4/24. -Follow culture data  --WC MODERATE ENTEROBACTER CLOACAE  MODERATE ENTEROCOCCUS FAECALIS     Atrial fibrillation, New, rate controlled H/o CAD,s/p CABG x1 in 2014 Seen by [facility] cardiology.  Transition patient to Eliquis, continue Plavix.  Discontinue aspirin Echo shows preserved EF  Transaminitis, improved - Hepatitis panel negative.  Repeat CMP with PCP outpatient  Peripheral arterial disease - Now on Eliquis and aspirin.  Eventually will need statin once LFTs improved -Check lipid panel   Uncontrolled type II diabetes with peripheral neuropathy  and CKD stage IIIB -A1c 6.9.  Sliding scale and Accu-Cheks -Gabapentin - Semglee 50 units bedtime.  Sliding scale and Accu-Chek   Hypotension with syncopal episode in the setting of pre-renal azotemia in post-op period CT head, CTA chest unremarkable.  Echocardiogram shows preserved EF   CKD stage IIIB -Creatinine stable around 1.4   Hyperlipidemia -- continue statins   DVT prophylaxis: Hep Drip > Eliquis Code Status: Full Code Family Communication:   Status is: Inpatient Monitoring culture.  Awaiting infectious disease input      Diet Orders (From admission, onward)     Start     Ordered   08/23/22 1648  Diet Carb Modified Fluid consistency: Thin; Room service appropriate? Yes  Diet effective now       Question Answer Comment  Diet-HS Snack? Nothing   Calorie Level Medium 1600-2000   Fluid consistency: Thin   Room service appropriate? Yes      08/23/22 1647            Subjective: Feels ok, tolerated his procedures yesterday  Examination:  Constitutional: Not in acute distress Respiratory: Clear to auscultation bilaterally Cardiovascular: Normal sinus rhythm, no rubs Abdomen: Nontender nondistended good bowel sounds Musculoskeletal: No edema noted Skin: LLE dressing in place.  Neurologic: CN 2-12 grossly intact.  And nonfocal Psychiatric: Normal judgment and insight. Alert and oriented x 3. Normal mood.    Objective: Vitals:   08/23/22 1646 08/23/22 1733 08/23/22 2000 08/24/22 0600  BP: 118/60 (!) 114/57 114/60 (!) 100/57  Pulse: 74 94 86 65  Resp:  20 18 16   Temp: 98.1 F (36.7 C) 97.8 F (36.6 C) 97.9 F (36.6 C) 98.1 F (36.7 C)  TempSrc:   Oral Oral  SpO2: 95% 97% 100% 97%  Weight:      Height:        Intake/Output Summary (Last 24 hours) at 08/24/2022 0800 Last data filed at 08/24/2022 4540 Gross per 24 hour  Intake 747.4 ml  Output 2020 ml  Net -1272.6 ml   Filed Weights   08/17/22 0913  Weight: 113.4 kg    Scheduled Meds:   aspirin EC  81 mg Oral Daily   cholecalciferol  2,000 Units Oral Daily   clopidogrel  75 mg Oral Q breakfast   docusate sodium  100 mg Oral BID   feeding supplement  237 mL Oral BID BM   gabapentin  300 mg Oral TID   insulin aspart  0-5 Units Subcutaneous QHS   insulin aspart  0-9 Units Subcutaneous TID WC   insulin glargine-yfgn  50 Units Subcutaneous QHS   metoprolol tartrate  25 mg Oral BID   sodium chloride flush  3 mL Intravenous Q12H   Continuous Infusions:  sodium chloride 10 mL/hr at 08/23/22 0643   sodium chloride     heparin 2,300 Units/hr (08/24/22 0622)   meropenem (MERREM) IV 1 g (08/24/22 0612)    Nutritional status     Body mass index is 31.25 kg/m.  Data Reviewed:   CBC: Recent Labs  Lab 08/17/22 0944 08/19/22 0453 08/20/22 0831 08/21/22 0900 08/22/22 0716 08/23/22 0210 08/24/22 0158  WBC 15.9*   < > 14.2* 15.9* 10.6* 9.4 9.6  NEUTROABS 14.3*  --   --   --   --   --   --   HGB 11.4*   < > 8.8* 9.2* 8.5* 8.8* 9.0*  HCT 34.8*   < > 26.7* 27.8* 25.9* 27.6* 28.0*  MCV 93.5   < > 93.4 92.7 92.8 94.5 94.0  PLT 372   < > 282 331 302 356 354   < > = values in this interval not displayed.   Basic Metabolic Panel: Recent Labs  Lab 08/17/22 0944 08/19/22 0453 08/19/22 1123 08/21/22 0515 08/21/22 0517 08/22/22 0716 08/23/22 0210  NA 135 133* 134*  --   --  135 133*  K 3.4* 4.0 3.9  --   --  3.9 4.2  CL 100 103 104  --   --  104 103  CO2 24 21* 23  --   --  23 22  GLUCOSE 146* 254* 157*  --   --  113* 242*  BUN 29* 19 18  --   --  20 21  CREATININE 2.02* 1.50* 1.49*  --  1.36* 1.26* 1.27*  CALCIUM 9.0 8.0* 8.1*  --   --  8.2* 8.1*  MG 1.3*  --   --  1.7  --   --   --    GFR: Estimated Creatinine Clearance: 70.4 mL/min (A) (by C-G formula based on SCr of 1.27 mg/dL (H)). Liver Function Tests: Recent Labs  Lab 08/17/22 0944 08/19/22 1123 08/22/22 0716 08/23/22 0210  AST 244* 126* 157* 98*  ALT 181* 153* 119* 99*  ALKPHOS 132* 127* 162* 183*   BILITOT 0.9 0.9 0.6 0.7  PROT 8.1 6.6 6.3* 6.3*  ALBUMIN 3.3* 2.4* 2.2* 2.2*   No results for input(s): "LIPASE", "AMYLASE" in the last 168 hours. No results for input(s): "AMMONIA" in the last 168 hours. Coagulation Profile: Recent Labs  Lab 08/17/22 0944  INR 1.2   Cardiac Enzymes: Recent Labs  Lab 08/17/22 1219  CKTOTAL 43*  BNP (last 3 results) No results for input(s): "PROBNP" in the last 8760 hours. HbA1C: No results for input(s): "HGBA1C" in the last 72 hours. CBG: Recent Labs  Lab 08/23/22 1112 08/23/22 1302 08/23/22 1536 08/23/22 1700 08/23/22 2124  GLUCAP 143* 137* 115* 114* 163*   Lipid Profile: No results for input(s): "CHOL", "HDL", "LDLCALC", "TRIG", "CHOLHDL", "LDLDIRECT" in the last 72 hours. Thyroid Function Tests: No results for input(s): "TSH", "T4TOTAL", "FREET4", "T3FREE", "THYROIDAB" in the last 72 hours. Anemia Panel: No results for input(s): "VITAMINB12", "FOLATE", "FERRITIN", "TIBC", "IRON", "RETICCTPCT" in the last 72 hours. Sepsis Labs: Recent Labs  Lab 08/17/22 0944 08/17/22 1823  LATICACIDVEN 1.7 1.8    Recent Results (from the past 240 hour(s))  Culture, blood (Routine x 2)     Status: None   Collection Time: 08/17/22  9:44 AM   Specimen: BLOOD RIGHT FOREARM  Result Value Ref Range Status   Specimen Description BLOOD RIGHT FOREARM  Final   Special Requests   Final    BOTTLES DRAWN AEROBIC AND ANAEROBIC Blood Culture adequate volume   Culture   Final    NO GROWTH 5 DAYS Performed at [facility], [address] [place], [place] [address]    Report Status 08/22/2022 FINAL  Final  Culture, blood (Routine x 2)     Status: None   Collection Time: 08/17/22  9:44 AM   Specimen: BLOOD RIGHT WRIST  Result Value Ref Range Status   Specimen Description BLOOD RIGHT WRIST  Final   Special Requests   Final    BOTTLES DRAWN AEROBIC AND ANAEROBIC Blood Culture results may not be optimal due to an inadequate volume of blood  received in culture bottles   Culture   Final    NO GROWTH 5 DAYS Performed at [facility], [address] [place], [place] [address]    Report Status 08/22/2022 FINAL  Final  Aerobic Culture w Gram Stain (superficial specimen)     Status: None   Collection Time: 08/18/22 10:39 AM   Specimen: Wound  Result Value Ref Range Status   Specimen Description   Final    WOUND Performed at [facility], [address], [place], [place] [address]    Special Requests   Final    LEFT TOE Performed at [facility], [address] [place], [place] [address]    Gram Stain   Final    FEW WBC PRESENT, PREDOMINANTLY PMN MODERATE GRAM NEGATIVE RODS FEW GRAM POSITIVE COCCI Performed at [facility], [address], [place], [place] [address]    Culture   Final    MODERATE ENTEROBACTER CLOACAE MODERATE ENTEROCOCCUS FAECALIS    Report Status 08/21/2022 FINAL  Final   Organism ID, Bacteria ENTEROBACTER CLOACAE  Final   Organism ID, Bacteria ENTEROCOCCUS FAECALIS  Final      Susceptibility   Enterobacter cloacae - MIC*    CEFEPIME <=0.12 SENSITIVE Sensitive     CEFTAZIDIME <=1 SENSITIVE Sensitive     CIPROFLOXACIN <=0.25 SENSITIVE Sensitive     GENTAMICIN <=1 SENSITIVE Sensitive     IMIPENEM 1 SENSITIVE Sensitive     TRIMETH/SULFA >=320 RESISTANT Resistant     PIP/TAZO <=4 SENSITIVE Sensitive     * MODERATE ENTEROBACTER CLOACAE   Enterococcus faecalis - MIC*    AMPICILLIN <=2 SENSITIVE Sensitive     VANCOMYCIN 1 SENSITIVE Sensitive     GENTAMICIN SYNERGY SENSITIVE Sensitive     * MODERATE ENTEROCOCCUS FAECALIS  Aerobic/Anaerobic  Culture w Gram Stain (surgical/deep wound)     Status: None (Preliminary result)   Collection Time: 08/23/22  2:43 PM   Specimen: Path fluid; Body Fluid  Result Value Ref Range Status   Specimen Description WOUND LEFT FOOT  Final   Special Requests DRAIN  Final   Gram Stain   Final    NO WBC SEEN NO ORGANISMS  SEEN Performed at [facility], [address], [place], [place] [address]    Culture PENDING  Incomplete   Report Status PENDING  Incomplete         Radiology Studies: PERIPHERAL VASCULAR CATHETERIZATION  Result Date: 08/22/2022 See surgical note for result.          LOS: 7 days   Time spent= 35 mins    Iza Preston Joline Maxcy, MD [facility]  If 7PM-7AM, please contact night-coverage  08/24/2022, 8:00 AM

## 2022-08-24 NOTE — Progress Notes (Signed)
Daily Progress Note   Subjective  - 1 Day Post-Op  Follow-up I&D of transmetatarsal amputation as well as medial ankle and medial midfoot.  No complaints today.    Objective Vitals:   08/23/22 1733 08/23/22 2000 08/24/22 0600 08/24/22 0814  BP: (!) 114/57 114/60 (!) 100/57 130/73  Pulse: 94 86 65 78  Resp: Temp: 97.8 F (36.6 C) 97.9 F (36.6 C) 98.1 F (36.7 C) 98.2 F (36.8 C)  TempSrc:  Oral Oral Oral  SpO2: 97% 100% 97% 98%  Weight:      Height:        Physical Exam: Packing removed.  Scant bloody drainage today.  No obvious purulence today.  Skin flaps are well-perfused.  Good capillary fill time.  No necrosis.  Foot is warm to touch.  Laboratory CBC    Component Value Date/Time   WBC 9.6 08/24/2022 0158   HGB 9.0 (L) 08/24/2022 0158   HCT 28.0 (L) 08/24/2022 0158   PLT 354 08/24/2022 0158    BMET    Component Value Date/Time   NA 136 08/24/2022 1020   K 3.6 08/24/2022 1020   CL 106 08/24/2022 1020   CO2 21 (L) 08/24/2022 1020   GLUCOSE 154 (H) 08/24/2022 1020   GLUCOSE 402 (H) 09/17/2012 1417   BUN 14 08/24/2022 1020   CREATININE 1.18 08/24/2022 1020   CALCIUM 8.3 (L) 08/24/2022 1020   GFRNONAA >60 08/24/2022 1020   GFRAA 58 (L) 06/02/2015 0656    Assessment/Planning: Status post postoperative day I&D left foot for recurrent infection  New packing applied.  We will continue to monitor to make sure there is no further worsening infection or recurrence.  We discussed the possibility of having to continue with I&D procedures until the purulent infection subsides. Once all purulence stops patient would be stable for discharge.  I will follow-up tomorrow for reevaluation.  I suspect he will be here through the weekend to make sure the infection has cleared.  Fortunately his white blood cell count has gone back to normal.  Appreciate infectious disease assistance with this as well as medicine and cardiology.  [name] A  08/24/2022, 1:27  PM

## 2022-08-24 NOTE — Progress Notes (Signed)
ANTICOAGULATION CONSULT NOTE  Pharmacy Consult for transition from heparin infusion to apixaban Indication: atrial fibrillation  Patient Measurements: Height:  (190.5 cm) Weight: 113.4 kg (250 lb) IBW/kg (Calculated) : 84.5 Heparin Dosing Weight: 108 kg  Labs: Recent Labs    08/22/22 0716 08/23/22 0210 08/23/22 1101 08/24/22 0158  HGB 8.5* 8.8*  --  9.0*  HCT 25.9* 27.6*  --  28.0*  PLT 302 356  --  354  HEPARINUNFRC 0.37 0.29* 0.44 0.26*  CREATININE 1.26* 1.27*  --   --     Estimated Creatinine Clearance: 70.4 mL/min (A) (by C-G formula based on SCr of 1.27 mg/dL (H)).  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 male w/ PMH of DM, periph neuropathy, HTN, HLD, CAD-CABG, CKD admitted for cellulitis now with new onset atrial fibrillation. He is on no chronic anticoagulation prior to arrival.  Goal of Therapy:  Monitor platelets by anticoagulation protocol: Yes   Plan:  --stop heparin infusion --start apixaban 5 mg po BID (meets 0/3 dose-reduction criteria) ---CBC and SCr at least once weekly  Lowella Bandy, PharmD 08/24/2022,7:01 AM

## 2022-08-24 NOTE — TOC Progression Note (Signed)
Transition of Care [facility]) - Progression Note    Patient Details  Name: Andrew Harding MRN: [id number] Date of Birth: 1950/12/02  Transition of Care [facility]) CM/SW Contact  Margarito Liner, LCSW Phone Number: 08/24/2022, 12:29 PM  Clinical Narrative:  Met with patient and wife. Discussed SNF backup to CIR and difference is level of care. If he cannot go to CIR, first preference SNF is [facility]. Sent referral to local facilities and asked [facility] admissions coordinator to review.   Expected Discharge Plan and Services                                               Social Determinants of Health (SDOH) Interventions SDOH Screenings   Food Insecurity: No Food Insecurity (08/19/2022)  Housing: Low Risk  (08/19/2022)  Transportation Needs: No Transportation Needs (08/19/2022)  Utilities: Not At Risk (08/19/2022)  Tobacco Use: High Risk (08/24/2022)    Readmission Risk Interventions     No data to display

## 2022-08-25 ENCOUNTER — Other Ambulatory Visit: Payer: Self-pay

## 2022-08-25 ENCOUNTER — Inpatient Hospital Stay: Payer: Managed Care, Other (non HMO)

## 2022-08-25 DIAGNOSIS — E11628 Type 2 diabetes mellitus with other skin complications: Secondary | ICD-10-CM | POA: Diagnosis not present

## 2022-08-25 DIAGNOSIS — M86172 Other acute osteomyelitis, left ankle and foot: Secondary | ICD-10-CM | POA: Diagnosis not present

## 2022-08-25 DIAGNOSIS — A419 Sepsis, unspecified organism: Secondary | ICD-10-CM | POA: Diagnosis not present

## 2022-08-25 DIAGNOSIS — L089 Local infection of the skin and subcutaneous tissue, unspecified: Secondary | ICD-10-CM | POA: Diagnosis not present

## 2022-08-25 DIAGNOSIS — Z79899 Other long term (current) drug therapy: Secondary | ICD-10-CM | POA: Diagnosis not present

## 2022-08-25 LAB — COMPREHENSIVE METABOLIC PANEL WITH GFR
ALT: 63 U/L — ABNORMAL HIGH (ref 0–44)
AST: 51 U/L — ABNORMAL HIGH (ref 15–41)
Albumin: 2.2 g/dL — ABNORMAL LOW (ref 3.5–5.0)
Alkaline Phosphatase: 163 U/L — ABNORMAL HIGH (ref 38–126)
Anion gap: 8 (ref 5–15)
BUN: 15 mg/dL (ref 8–23)
CO2: 26 mmol/L (ref 22–32)
Calcium: 8.3 mg/dL — ABNORMAL LOW (ref 8.9–10.3)
Chloride: 104 mmol/L (ref 98–111)
Creatinine, Ser: 1.19 mg/dL (ref 0.61–1.24)
GFR, Estimated: >60 mL/min
Glucose, Bld: 121 mg/dL — ABNORMAL HIGH (ref 70–99)
Potassium: 3.7 mmol/L (ref 3.5–5.1)
Sodium: 138 mmol/L (ref 135–145)
Total Bilirubin: 0.7 mg/dL (ref 0.3–1.2)
Total Protein: 6.6 g/dL (ref 6.5–8.1)

## 2022-08-25 LAB — GLUCOSE, CAPILLARY
Glucose-Capillary: 119 mg/dL — ABNORMAL HIGH (ref 70–99)
Glucose-Capillary: 216 mg/dL — ABNORMAL HIGH (ref 70–99)
Glucose-Capillary: 162 mg/dL — ABNORMAL HIGH (ref 70–99)
Glucose-Capillary: 148 mg/dL — ABNORMAL HIGH (ref 70–99)

## 2022-08-25 LAB — CBC
HCT: 27.9 % — ABNORMAL LOW (ref 39.0–52.0)
Hemoglobin: 9 g/dL — ABNORMAL LOW (ref 13.0–17.0)
MCH: 30.5 pg (ref 26.0–34.0)
MCHC: 32.3 g/dL (ref 30.0–36.0)
MCV: 94.6 fL (ref 80.0–100.0)
Platelets: 408 K/uL — ABNORMAL HIGH (ref 150–400)
RBC: 2.95 MIL/uL — ABNORMAL LOW (ref 4.22–5.81)
RDW: 12.8 % (ref 11.5–15.5)
WBC: 8.5 K/uL (ref 4.0–10.5)
nRBC: 0 % (ref 0.0–0.2)

## 2022-08-25 MED ORDER — CHLORHEXIDINE GLUCONATE CLOTH 2 % EX PADS
6.0000 | MEDICATED_PAD | Freq: Every day | CUTANEOUS | Status: DC
  Administered 2022-08-25 – 2022-08-29 (×5): 6 via TOPICAL

## 2022-08-25 MED ORDER — SODIUM CHLORIDE 0.9% FLUSH: 10.0000 mL | INTRAVENOUS | Status: DC | PRN

## 2022-08-25 MED ORDER — SODIUM CHLORIDE 0.9% FLUSH
10.0000 mL | Freq: Two times a day (BID) | INTRAVENOUS | Status: DC
  Administered 2022-08-25 – 2022-08-29 (×9): 10 mL

## 2022-08-25 NOTE — Consult Note (Signed)
Physical Medicine and Rehabilitation Consult Reason for Consult: s/p left foot transmetatarsal amputation   Referring Physician: Stephania Fragmin, MD   HPI: Andrew Harding is a 72 y.o. male who is status post transmetatarsal amputation and I&D of left foot for gas producing infection. He was seen by podiatry today and his wound was flushed and repacked. Physical Medicine & Rehabilitation was consulted to assess candidacy for CIR.     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   Tubular adenoma    Vitamin D deficiency    Past Surgical History:  Procedure Laterality Date   AMPUTATION Left 08/19/2022   Procedure: TRANSMETATARSAL AMPUTATION LEFT FOOT WITH IRRIGATION AND DEBRIDEMENT;  Surgeon: Rosetta Posner, DPM;  Location: [facility];  Service: Podiatry;  Laterality: Left;   AMPUTATION TOE Right 06/01/2015   Procedure: AMPUTATION TOE;  Surgeon: Recardo Evangelist, DPM;  Location: [facility];  Service: Podiatry;  Laterality: Right;   AMPUTATION TOE Left 08/11/2022   Procedure: AMPUTATION TOE 2, 3, 4;  Surgeon: [name], DPM;  Location: [facility];  Service: Podiatry;  Laterality: Left;   CATARACT EXTRACTION, BILATERAL     CIRCUMCISION N/A 06/12/2022   Procedure: CIRCUMCISION ADULT;  Surgeon: Vanna Scotland, MD;  Location: [facility];  Service: Urology;  Laterality: N/A;   COLONOSCOPY WITH PROPOFOL N/A 02/14/2016   Procedure: COLONOSCOPY WITH PROPOFOL;  Surgeon: Christena Deem, MD;  Location: [facility] ENDOSCOPY;  Service: Endoscopy;  Laterality: N/A;   COLONOSCOPY WITH PROPOFOL N/A  01/07/2019   Procedure: COLONOSCOPY WITH PROPOFOL;  Surgeon: Christena Deem, MD;  Location: [facility] ENDOSCOPY;  Service: Endoscopy;  Laterality: N/A;   CORONARY ARTERY BYPASS GRAFT N/A 08/2012   EXCISION PARTIAL PHALANX Right 06/01/2015   Procedure: EXCISION PARTIAL PHALANX /  BONE;  Surgeon: Recardo Evangelist, DPM;  Location: [facility];  Service: Podiatry;  Laterality: Right;   FLEXIBLE SIGMOIDOSCOPY N/A 05/29/2016   Procedure: FLEXIBLE SIGMOIDOSCOPY;  Surgeon: Christena Deem, MD;  Location: [facility] ENDOSCOPY;  Service: Endoscopy;  Laterality: N/A;   INCISION AND DRAINAGE Left 08/23/2022   Procedure: INCISION AND DRAINAGE;  Surgeon: [name], DPM;  Location: [facility];  Service: Podiatry;  Laterality: Left;   KNEE ARTHROSCOPY Left    LOWER EXTREMITY ANGIOGRAPHY Left 08/22/2022   Procedure: Lower Extremity Angiography;  Surgeon: Renford Dills, MD;  Location: [facility] INVASIVE CV LAB;  Service: Cardiovascular;  Laterality: Left;   Family History  Problem Relation Age of Onset   Diabetes Mellitus II Mother    CAD Mother    Social History:  reports that he has been smoking cigars. He has never used smokeless tobacco. He reports that he does not drink alcohol and does not use drugs. Allergies: No Known Allergies Medications Prior to Admission  Medication Sig Dispense Refill   aspirin EC 81 MG tablet Take 81 mg by mouth daily.     betamethasone dipropionate 0.05 % cream Apply topically 2 (two) times daily. 30 g 0   [  EXPIRED] cefdinir (OMNICEF) 300 MG capsule Take 300 mg by mouth 2 (two) times daily.     cholecalciferol (VITAMIN D) 1000 units tablet Take 2,000 Units by mouth daily.     gabapentin (NEURONTIN) 300 MG capsule Take 300 mg by mouth 3 (three) times daily.     insulin glargine (LANTUS) 100 UNIT/ML injection Inject 52 Units into the skin at bedtime.     lisinopril (ZESTRIL) 20 MG tablet Take 20 mg by mouth daily.     metFORMIN (GLUCOPHAGE-XR) 500 MG 24 hr tablet 500 mg 2 (two) times  daily with a meal.     NOVOLOG FLEXPEN 100 UNIT/ML FlexPen Inject 15-20 Units into the skin 3 (three) times daily with meals.     pravastatin (PRAVACHOL) 80 MG tablet TAKE ONE TABLET BY MOUTH AT NIGHT     silver sulfADIAZINE (SILVADENE) 1 % cream Apply topically.     traZODone (DESYREL) 50 MG tablet TAKE ONE TABLET AT BEDTIME AS NEEDED     TRULICITY 4.5 MG/0.5ML SOPN Inject into the skin.     glucose blood (ONETOUCH ULTRA) test strip Use 3 (three) times daily     Insulin Pen Needle (FIFTY50 PEN NEEDLES) 32G X 4 MM MISC USE 4 TIMES DAILY      Home: Home Living Family/patient expects to be discharged to:: Private residence Living Arrangements: Alone Available Help at Discharge: Friend(s) Type of Home: House Home Access: Stairs to enter Secretary/administrator of Steps: 4 Entrance Stairs-Rails: None Home Layout: Able to live on main level with bedroom/bathroom Alternate Level Stairs-Number of Steps: bonus room (not required daily) with stairs to enter [employer] Shower/Tub: Tub/shower unit [employer]: Handicapped height Bathroom Accessibility: Yes Home Equipment: Cane - quad, Agricultural consultant (2 wheels) Additional Comments: Male friend at bedside today; when asked what their relationship is, she states "it's complicated". It appears that she cares about him a lot, but is unable to be with him 24/7 due to health concerns of her own and responsibility for caring for others. She lives 20 minutes away; can stop by maybe 1x/daily. Did not explore this session about other friend relationships who may be able to help.  Lives With: Alone  Functional History: Prior Function Prior Level of Function : Working/employed, Driving Mobility Comments: typically active, is an [employer]. independent prior to recent surgery (afterwards was using quad cane for ambulation and had a fall at home) ADLs Comments: independent prior to surgery Functional Status:  Mobility: Bed Mobility Overal  bed mobility: Needs Assistance Bed Mobility: Supine to Sit, Sit to Supine Supine to sit: HOB elevated, Supervision Sit to supine: Supervision, HOB elevated General bed mobility comments: Increased time/effort to perform. Transfers Overall transfer level: Needs assistance Equipment used: Rolling walker (2 wheels) Transfers: Sit to/from Stand Sit to Stand: Min assist Bed to/from chair/wheelchair/BSC transfer type:: Lateral/scoot transfer Step pivot transfers: Mod assist  Lateral/Scoot Transfers: Supervision, From elevated surface (can sccot entired length of bed each way with NWB, EOB elevated.) General transfer comment: verbal cues for technique, hand placement, LLE positioning to maintain NWB of LLE with transfers. encouraged proper use of rolling walker positioning and use of upper body support to maintain NWB of LLE with transfer. remains limited by fatigue Ambulation/Gait Ambulation/Gait assistance: Min guard Gait Distance (Feet): 7 Feet Assistive device: Rolling walker (2 wheels) General Gait Details: fwd and backward hop-step at bedside;, maintained NWB but needs minGuard due to precarious balance. Gait velocity: decreased    ADL: ADL Overall ADL's : Needs  assistance/impaired Grooming: Standing, Min guard, Cueing for compensatory techniques, Cueing for safety Grooming Details (indicate cue type and reason): Simulated UB grooming at sink while standing with RW. Pt able to stand briefly without UE support, but fatigues quickly. Educated on compensatory ADL management strategies to maximize safety, functional independence, and adherence to NWB precautions. Functional mobility during ADLs: Min guard, Cueing for safety, Rolling walker (2 wheels) General ADL Comments: Min guard for functional mobiltiy with RW across short distance from EOB to sink (~4') Upon return to bed, x1 instance of posterior LOB appreciated with assist required for safe lowering onto seated surface and to maintain  NWB. Continues to require close contact guard during functional mobility and multimodal cueing to maintain NWB status during functional mobiltiy.  Cognition: Cognition Overall Cognitive Status: Within Functional Limits for tasks assessed Orientation Level: Oriented X4 Cognition Arousal/Alertness: Awake/alert Behavior During Therapy: WFL for tasks assessed/performed Overall Cognitive Status: Within Functional Limits for tasks assessed General Comments: cooperative  Blood pressure 123/71, pulse 76, temperature 98.6 F (37 C), resp. rate 18, height 6\' 3"  (1.905 m), weight 113.4 kg, SpO2 99 %. Physical Exam Gen: no distress, normal appearing HEENT: oral mucosa pink and moist, NCAT Cardio: Reg rate Chest: normal effort, normal rate of breathing Abd: soft, non-distended Ext: no edema Psych: pleasant, normal affect Skin: Serosanginous slightly purulence drainage from incision site  Results for orders placed or performed during the hospital encounter of 08/17/22 (from the past 24 hour(s))  Glucose, capillary     Status: Abnormal   Collection Time: 08/24/22  4:44 PM  Result Value Ref Range   Glucose-Capillary 272 (H) 70 - 99 mg/dL  Glucose, capillary     Status: Abnormal   Collection Time: 08/24/22 10:11 PM  Result Value Ref Range   Glucose-Capillary 157 (H) 70 - 99 mg/dL  CBC     Status: Abnormal   Collection Time: 08/25/22  4:51 AM  Result Value Ref Range   WBC 8.5 4.0 - 10.5 K/uL   RBC 2.95 (L) 4.22 - 5.81 MIL/uL   Hemoglobin 9.0 (L) 13.0 - 17.0 g/dL   HCT 84.1 (L) 32.4 - 40.1 %   MCV 94.6 80.0 - 100.0 fL   MCH 30.5 26.0 - 34.0 pg   MCHC 32.3 30.0 - 36.0 g/dL   RDW 02.7 25.3 - 66.4 %   Platelets 408 (H) 150 - 400 K/uL   nRBC 0.0 0.0 - 0.2 %  Comprehensive metabolic panel     Status: Abnormal   Collection Time: 08/25/22  4:51 AM  Result Value Ref Range   Sodium 138 135 - 145 mmol/L   Potassium 3.7 3.5 - 5.1 mmol/L   Chloride 104 98 - 111 mmol/L   CO2 26 22 - 32 mmol/L    Glucose, Bld 121 (H) 70 - 99 mg/dL   BUN 15 8 - 23 mg/dL   Creatinine, Ser 4.03 0.61 - 1.24 mg/dL   Calcium 8.3 (L) 8.9 - 10.3 mg/dL   Total Protein 6.6 6.5 - 8.1 g/dL   Albumin 2.2 (L) 3.5 - 5.0 g/dL   AST 51 (H) 15 - 41 U/L   ALT 63 (H) 0 - 44 U/L   Alkaline Phosphatase 163 (H) 38 - 126 U/L   Total Bilirubin 0.7 0.3 - 1.2 mg/dL   GFR, Estimated >47 >42 mL/min   Anion gap 8 5 - 15  Glucose, capillary     Status: Abnormal   Collection Time: 08/25/22  8:15 AM  Result Value  Ref Range   Glucose-Capillary 119 (H) 70 - 99 mg/dL   [place] EKG SITE RITE  Result Date: 08/25/2022 If Site Rite image not attached, placement could not be confirmed due to current cardiac rhythm.   Assessment/Plan: Diagnosis: Left transmetatarsal amputation Does the need for close, 24 hr/day medical supervision in concert with the patient's rehab needs make it unreasonable for this patient to be served in a less intensive setting? Yes Co-Morbidities requiring supervision/potential complications:  1) HTN: monitor BP TID 2) Purulent drainage: monitor incision daily 3) Impaired balance: would benefit from inpatient PT 4) DVT ppx: continue Eliquis 5mg  BID and Plavix 75mg  daily 5) Enterobacter and enterococcus infection: PICC line recommended Due to bladder management, bowel management, safety, skin/wound care, disease management, medication administration, pain management, and patient education, does the patient require 24 hr/day rehab nursing? Yes Does the patient require coordinated care of a physician, rehab nurse, therapy disciplines of PT, OT to address physical and functional deficits in the context of the above medical diagnosis(es)? Yes Addressing deficits in the following areas: balance, endurance, locomotion, strength, transferring, bowel/bladder control, bathing, dressing, feeding, grooming, toileting, and psychosocial support Can the patient actively participate in an intensive therapy program of at least 3 hrs  of therapy per day at least 5 days per week? Yes The potential for patient to make measurable gains while on inpatient rehab is excellent Anticipated functional outcomes upon discharge from inpatient rehab are modified independent  with PT, modified independent with OT, independent with SLP. Estimated rehab length of stay to reach the above functional goals is: 10-14 days Anticipated discharge destination: Home Overall Rehab/Functional Prognosis: excellent  POST ACUTE RECOMMENDATIONS: This patient's condition is appropriate for continued rehabilitative care in the following setting: CIR Patient has agreed to participate in recommended program. Yes Note that insurance prior authorization may be required for reimbursement for recommended care.   I have personally performed a face to face diagnostic evaluation of this patient. Additionally, I have examined the patient's medical record including any pertinent labs and radiographic images. If the physician assistant has documented in this note, I have reviewed and edited or otherwise concur with the physician assistant's documentation.  Thanks,  Horton Chin, MD 08/25/2022

## 2022-08-25 NOTE — Progress Notes (Signed)
PROGRESS NOTE    PHI AVANS  GMW:[id number] DOB: 04-12-1951 DOA: 08/17/2022 PCP: Lynnea Ferrier, MD   Brief Narrative:  72 year old with history of poorly controlled insulin-dependent DM 2, peripheral neuropathy, HLD, HTN, CAD status post CABG sent to the hospital from Dr. Irene Limbo office due to worsening left foot swelling and erythema.  Patient also had a syncopal episode.  Patient underwent left lower extremity angiogram by vascular on 4/23.  Successful recannulization was performed with improvement in blood flows.  Infectious disease is currently following, antibiotics per their recommendations.  Patient underwent I&D of multiple sites of left foot and left ankle by podiatry, cultures were sent.   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  ID, vascular and podiatry following - Continue meropenem -Underwent LLE angiogram by vascular 4/23, successful recannulization and improvement in blood flows -Status post I&D by podiatry on 4/24.  Will need purulent discharge to resolve per podiatry prior to discharge. -Follow culture data  ID recommending PICC line  --WC MODERATE ENTEROBACTER CLOACAE  MODERATE ENTEROCOCCUS FAECALIS     Atrial fibrillation, New, rate controlled H/o CAD,s/p CABG x1 in 2014 Seen by [facility] cardiology.  Transition patient to Eliquis, continue Plavix.  Discontinue aspirin Echo shows preserved EF  Transaminitis, improved - Hepatitis panel negative.   Repeat CMP with PCP outpatient  Peripheral arterial disease - Now on Eliquis and aspirin.  Eventually will need statin once LFTs improved - LDL 54   Uncontrolled type II diabetes with peripheral neuropathy and CKD stage IIIB -A1c 6.9.  Sliding scale and Accu-Cheks -Gabapentin - Semglee 50 units bedtime.  Sliding scale and Accu-Chek   Hypotension with syncopal episode in the setting of pre-renal azotemia in post-op period CT head, CTA chest unremarkable.  Echocardiogram shows preserved EF   CKD stage IIIB -Creatinine stable around 1.2   Hyperlipidemia -- continue statins   DVT prophylaxis: Hep Drip > Eliquis Code Status: Full Code Family Communication:   Status is: Inpatient Awaiting purulent discharge clearance per podiatry.  Once resolved, patient will be discharge.  In the meantime we will order PICC line placement   Diet Orders (From admission, onward)     Start     Ordered   08/23/22 1648  Diet Carb Modified Fluid consistency: Thin; Room service appropriate? Yes  Diet effective now       Question Answer Comment  Diet-HS Snack? Nothing   Calorie Level Medium 1600-2000   Fluid consistency: Thin   Room service appropriate? Yes      08/23/22 1647            Subjective: Patient feeling okay, no complaints Examination: Constitutional: Not in acute distress Respiratory: Clear to auscultation bilaterally Cardiovascular: Normal sinus rhythm, no rubs Abdomen: Nontender nondistended good bowel sounds Musculoskeletal: No edema noted Skin: Left foot dressing in place Neurologic: CN 2-12 grossly intact.  And nonfocal Psychiatric: Normal judgment and insight. Alert and oriented x 3. Normal mood.  Objective: Vitals:   08/24/22 1611 08/24/22 1916 08/25/22 0525 08/25/22 0713  BP: 136/74 136/73 (!) 145/79 123/71  Pulse: 98 98 72 76  Resp: 16 16 18    Temp: 98.3 F (36.8 C) 98.3 F (36.8 C) 98.3 F (36.8 C) 98.6 F (37 C)  TempSrc: Oral  Oral   SpO2: 97% 98%  100% 99%  Weight:      Height:        Intake/Output Summary (Last 24 hours) at 08/25/2022 0748 Last data filed at 08/25/2022 0530 Gross per 24 hour  Intake 540 ml  Output 1900 ml  Net -1360 ml   Filed Weights   08/17/22 0913  Weight: 113.4 kg    Scheduled Meds:  apixaban  5 mg Oral BID   cholecalciferol  2,000 Units Oral Daily   clopidogrel  75 mg Oral Q breakfast   docusate sodium  100 mg Oral BID   feeding supplement  237 mL Oral BID BM   gabapentin  300 mg Oral TID   insulin aspart  0-5 Units Subcutaneous QHS   insulin aspart  0-9 Units Subcutaneous TID WC   insulin glargine-yfgn  50 Units Subcutaneous QHS   metoprolol tartrate  25 mg Oral BID   sodium chloride flush  3 mL Intravenous Q12H   Continuous Infusions:  sodium chloride 10 mL/hr at 08/23/22 0643   sodium chloride     meropenem (MERREM) IV 1 g (08/25/22 0530)    Nutritional status     Body mass index is 31.25 kg/m.  Data Reviewed:   CBC: Recent Labs  Lab 08/21/22 0900 08/22/22 0716 08/23/22 0210 08/24/22 0158 08/25/22 0451  WBC 15.9* 10.6* 9.4 9.6 8.5  HGB 9.2* 8.5* 8.8* 9.0* 9.0*  HCT 27.8* 25.9* 27.6* 28.0* 27.9*  MCV 92.7 92.8 94.5 94.0 94.6  PLT 331 302 356 354 408*   Basic Metabolic Panel: Recent Labs  Lab 08/19/22 1123 08/21/22 0515 08/21/22 0517 08/22/22 0716 08/23/22 0210 08/24/22 1020 08/25/22 0451  NA 134*  --   --  135 133* 136 138  K 3.9  --   --  3.9 4.2 3.6 3.7  CL 104  --   --  104 103 106 104  CO2 23  --   --  23 22 21* 26  GLUCOSE 157*  --   --  113* 242* 154* 121*  BUN 18  --   --  20 21 14 15   CREATININE 1.49*  --  1.36* 1.26* 1.27* 1.18 1.19  CALCIUM 8.1*  --   --  8.2* 8.1* 8.3* 8.3*  MG  --  1.7  --   --   --   --   --    GFR: Estimated Creatinine Clearance: 75.1 mL/min (by C-G formula based on SCr of 1.19 mg/dL). Liver Function Tests: Recent Labs  Lab 08/19/22 1123 08/22/22 0716 08/23/22 0210 08/24/22 1020 08/25/22 0451  AST 126* 157* 98* 49* 51*   ALT 153* 119* 99* 69* 63*  ALKPHOS 127* 162* 183* 172* 163*  BILITOT 0.9 0.6 0.7 0.6 0.7  PROT 6.6 6.3* 6.3* 6.6 6.6  ALBUMIN 2.4* 2.2* 2.2* 2.2* 2.2*   No results for input(s): "LIPASE", "AMYLASE" in the last 168 hours. No results for input(s): "AMMONIA" in the last 168 hours. Coagulation Profile: No results for input(s): "INR", "PROTIME" in the last 168 hours.  Cardiac Enzymes: No results for input(s): "CKTOTAL", "CKMB", "CKMBINDEX", "TROPONINI" in the last 168 hours.  BNP (last 3 results) No  results for input(s): "PROBNP" in the last 8760 hours. HbA1C: No results for input(s): "HGBA1C" in the last 72 hours. CBG: Recent Labs  Lab 08/23/22 2124 08/24/22 0814 08/24/22 1107 08/24/22 1644 08/24/22 2211  GLUCAP 163* 134* 177* 272* 157*   Lipid Profile: Recent Labs    08/24/22 1020  CHOL 92  HDL 17*  LDLCALC 54  TRIG 161  CHOLHDL 5.4   Thyroid Function Tests: No results for input(s): "TSH", "T4TOTAL", "FREET4", "T3FREE", "THYROIDAB" in the last 72 hours. Anemia Panel: No results for input(s): "VITAMINB12", "FOLATE", "FERRITIN", "TIBC", "IRON", "RETICCTPCT" in the last 72 hours. Sepsis Labs: No results for input(s): "PROCALCITON", "LATICACIDVEN" in the last 168 hours.   Recent Results (from the past 240 hour(s))  Culture, blood (Routine x 2)     Status: None   Collection Time: 08/17/22  9:44 AM   Specimen: BLOOD RIGHT FOREARM  Result Value Ref Range Status   Specimen Description BLOOD RIGHT FOREARM  Final   Special Requests   Final    BOTTLES DRAWN AEROBIC AND ANAEROBIC Blood Culture adequate volume   Culture   Final    NO GROWTH 5 DAYS Performed at [facility], [address] [place], [place] [address]    Report Status 08/22/2022 FINAL  Final  Culture, blood (Routine x 2)     Status: None   Collection Time: 08/17/22  9:44 AM   Specimen: BLOOD RIGHT WRIST  Result Value Ref Range Status   Specimen Description BLOOD RIGHT WRIST  Final   Special  Requests   Final    BOTTLES DRAWN AEROBIC AND ANAEROBIC Blood Culture results may not be optimal due to an inadequate volume of blood received in culture bottles   Culture   Final    NO GROWTH 5 DAYS Performed at [facility], [address], [place], [place] [address]    Report Status 08/22/2022 FINAL  Final  Aerobic Culture w Gram Stain (superficial specimen)     Status: None   Collection Time: 08/18/22 10:39 AM   Specimen: Wound  Result Value Ref Range Status   Specimen Description   Final    WOUND Performed at [facility], [address] [place], [place] [address]    Special Requests   Final    LEFT TOE Performed at [facility], [address] [place], [place] [address]    Gram Stain   Final    FEW WBC PRESENT, PREDOMINANTLY PMN MODERATE GRAM NEGATIVE RODS FEW GRAM POSITIVE COCCI Performed at [facility], [address], [place], [place] [address]    Culture   Final    MODERATE ENTEROBACTER CLOACAE MODERATE ENTEROCOCCUS FAECALIS    Report Status 08/21/2022 FINAL  Final   Organism ID, Bacteria ENTEROBACTER CLOACAE  Final   Organism ID, Bacteria ENTEROCOCCUS FAECALIS  Final      Susceptibility   Enterobacter cloacae - MIC*    CEFEPIME <=0.12 SENSITIVE Sensitive     CEFTAZIDIME <=1 SENSITIVE Sensitive     CIPROFLOXACIN <=0.25 SENSITIVE Sensitive     GENTAMICIN <=1 SENSITIVE Sensitive     IMIPENEM 1 SENSITIVE Sensitive     TRIMETH/SULFA >=320 RESISTANT Resistant     PIP/TAZO <=4 SENSITIVE Sensitive     * MODERATE ENTEROBACTER CLOACAE   Enterococcus faecalis - MIC*    AMPICILLIN <=2 SENSITIVE Sensitive     VANCOMYCIN 1 SENSITIVE Sensitive     GENTAMICIN SYNERGY SENSITIVE Sensitive     * MODERATE ENTEROCOCCUS FAECALIS  Aerobic/Anaerobic Culture w Gram Stain (surgical/deep wound)     Status: None (Preliminary result)   Collection Time: 08/23/22  2:43 PM   Specimen: Path fluid; Body Fluid  Result Value Ref Range Status   Specimen  Description WOUND LEFT FOOT  Final   Special Requests DRAIN  Final   Gram Stain NO WBC SEEN NO ORGANISMS SEEN   Final   Culture   Final    NO GROWTH < 12 HOURS Performed at [facility], [address], [place], [place] [address]    Report Status PENDING  Incomplete         Radiology Studies: No results found.         LOS: 8 days   Time spent= 35 mins    Ivey Nembhard Joline Maxcy, MD [facility]  If 7PM-7AM, please contact night-coverage  08/25/2022, 7:48 AM

## 2022-08-25 NOTE — Progress Notes (Signed)
Physical Therapy Treatment Patient Details Name: Andrew Harding MRN: [id number] DOB: Oct 27, 1950 Today's Date: 08/25/2022   History of Present Illness Andrew Harding is a 72 year old male with recent toe amputations on left foot. Found to have worsening infection with gangrene and osteomyelitis. 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 History of diabetes mellitus.    PT Comments    Pt was long sitting in bed upon arrival. He is A and O and agreeable to session. Does present with some impulsivity at times but is motivated to improve. He was able to exit L side of bed, stand to RW, and hop to doorway of room and back to EOB. Once fatigued, gait safety becomes poor. Author reviewed importance of ther ex, positioning, and maintaining NWB for proper healing. He states understanding but will benefit from further reinforcement. Recs remain appropriate. Acute PT will continue to follow per current POC.     Recommendations for follow up therapy are one component of a multi-disciplinary discharge planning process, led by the attending physician.  Recommendations may be updated based on patient status, additional functional criteria and insurance authorization.     Assistance Recommended at Discharge Set up Supervision/Assistance  Patient can return home with the following A little help with walking and/or transfers;A little help with bathing/dressing/bathroom;Assistance with cooking/housework;Assist for transportation;Help with stairs or ramp for entrance   Equipment Recommendations  Other (comment) (Defer to next level of care)       Precautions / Restrictions Precautions Precautions: Fall Restrictions Weight Bearing Restrictions: Yes LLE Weight Bearing: Non weight bearing     Mobility  Bed Mobility Overal bed mobility: Needs Assistance Bed Mobility: Supine to Sit  Supine to sit: HOB elevated, Supervision  General bed mobility comments: no physical assistance required to achieve EOB short  sit.    Transfers Overall transfer level: Needs assistance Equipment used: Rolling walker (2 wheels) Transfers: Sit to/from Stand Sit to Stand: Min guard, Min assist  General transfer comment: CGA for slightly elevated surfaces but min assist form lower. vcs for handplacement, fwd wt shift, and overall technique improvements    Ambulation/Gait Ambulation/Gait assistance: Min guard Gait Distance (Feet): 20 Feet Assistive device: Rolling walker (2 wheels) Gait Pattern/deviations: Step-to pattern (" hop to.") Gait velocity: decreased  General Gait Details: pt was able to hop to doorway and return 1 x but was limited by fatigue. Once fatigued required Vcs for adhering to NWB. Ambulated a second trial to recliner placed ~ 10 ft away.   Balance Overall balance assessment: Needs assistance Sitting-balance support: Feet supported Sitting balance-Leahy Scale: Good     Standing balance support: Bilateral upper extremity supported, During functional activity, Reliant on assistive device for balance Standing balance-Leahy Scale: Fair Standing balance comment: Pt required vcs to slow down and to stand and rest while performing NWB however pt is slightly impulsive and attempts to sit quickly. High fall risk due to impulsivity/ NWB     Cognition Arousal/Alertness: Awake/alert Behavior During Therapy: WFL for tasks assessed/performed Overall Cognitive Status: Within Functional Limits for tasks assessed   General Comments: Pt is A and O and agreeable to session. Frustrated about overall situation and states he is getting inpatient with the process of recovering and NWB           General Comments General comments (skin integrity, edema, etc.): author reviewed exercises to promote circulation and healing.      Pertinent Vitals/Pain Pain Assessment Pain Assessment: No/denies pain Pain Score: 0-No pain  PT Goals (current goals can now be found in the care plan section) Acute Rehab PT  Goals Patient Stated Goal: rehab then home Progress towards PT goals: Progressing toward goals    Frequency    Min 4X/week      PT Plan Current plan remains appropriate    Co-evaluation     PT goals addressed during session: Mobility/safety with mobility        AM-PAC PT "6 Clicks" Mobility   Outcome Measure  Help needed turning from your back to your side while in a flat bed without using bedrails?: None Help needed moving from lying on your back to sitting on the side of a flat bed without using bedrails?: None Help needed moving to and from a bed to a chair (including a wheelchair)?: A Little Help needed standing up from a chair using your arms (e.g., wheelchair or bedside chair)?: A Little Help needed to walk in hospital room?: A Little Help needed climbing 3-5 steps with a railing? : A Lot 6 Click Score: 19    End of Session   Activity Tolerance: Patient tolerated treatment well Patient left: in chair;with call bell/phone within reach Nurse Communication: Mobility status PT Visit Diagnosis: Muscle weakness (generalized) (M62.81);Difficulty in walking, not elsewhere classified (R26.2)     Time: 1610-9604 PT Time Calculation (min) (ACUTE ONLY): 18 min  Charges:  $Gait Training: 8-22 mins                    Jetta Lout PTA 08/25/22, 4:58 PM

## 2022-08-25 NOTE — Progress Notes (Addendum)
Inpatient Rehab Admissions Coordinator:     I continue to await insurance auth for CIR (from both [place] and [facility]). Also await clearance from podiatry.  I will continue to follow for potential admit pending auth and medical readiness.   Megan Salon, MS, CCC-SLP Rehab Admissions Coordinator  [phone] (celll) [phone] (office)

## 2022-08-25 NOTE — Progress Notes (Signed)
Daily Progress Note   Subjective  - 2 Days Post-Op  Dressing change for left foot I&D today.  No complaints.  Appreciate ID input.  Objective Vitals:   08/24/22 1611 08/24/22 1916 08/25/22 0525 08/25/22 0713  BP: 136/74 136/73 (!) 145/79 123/71  Pulse: 98 98 72 76  Resp: 16 16 18    Temp: 98.3 F (36.8 C) 98.3 F (36.8 C) 98.3 F (36.8 C) 98.6 F (37 C)  TempSrc: Oral  Oral   SpO2: 97% 98% 100% 99%  Weight:      Height:        Physical Exam: Wound flaps are well-perfused.  The medial ankle and plantar arch has no drainage.  Still a scant amount of serosanguineous slightly purulent tinged fluid from the distal incision site.  Packing removed and repacked today.  Laboratory CBC    Component Value Date/Time   WBC 8.5 08/25/2022 0451   HGB 9.0 (L) 08/25/2022 0451   HCT 27.9 (L) 08/25/2022 0451   PLT 408 (H) 08/25/2022 0451    BMET    Component Value Date/Time   NA 138 08/25/2022 0451   K 3.7 08/25/2022 0451   CL 104 08/25/2022 0451   CO2 26 08/25/2022 0451   GLUCOSE 121 (H) 08/25/2022 0451   GLUCOSE 402 (H) 09/17/2012 1417   BUN 15 08/25/2022 0451   CREATININE 1.19 08/25/2022 0451   CALCIUM 8.3 (L) 08/25/2022 0451   GFRNONAA >60 08/25/2022 0451   GFRAA 58 (L) 06/02/2015 0656    Assessment/Planning: Status post transmetatarsal amputation and I&D left foot for gas producing infection  I flushed the wound today with saline.  Repacked again today.  Will begin tomorrow.  If there is still signs of purulent discharge may need to plan for 1 last I&D washout of the area.  Continue with nonweightbearing to the left foot.  PICC line has been planned in the near future.  Will need long-term IV antibiotics.   Andrew Harding A  08/25/2022, 10:50 AM

## 2022-08-25 NOTE — Progress Notes (Signed)
Peripherally Inserted Central Catheter Placement  The IV Nurse has discussed with the patient and/or persons authorized to consent for the patient, the purpose of this procedure and the potential benefits and risks involved with this procedure.  The benefits include less needle sticks, lab draws from the catheter, and the patient may be discharged home with the catheter. Risks include, but not limited to, infection, bleeding, blood clot (thrombus formation), and puncture of an artery; nerve damage and irregular heartbeat and possibility to perform a PICC exchange if needed/ordered by physician.  Alternatives to this procedure were also discussed.  Bard Power PICC patient education guide, fact sheet on infection prevention and patient information card has been provided to patient /or left at bedside.    PICC Placement Documentation  PICC Single Lumen 08/25/22 Right Basilic 40 cm 0 cm (Active)  Indication for Insertion or Continuance of Line Prolonged intravenous therapies 08/25/22 1351  Exposed Catheter (cm) 0 cm 08/25/22 1351  Site Assessment Clean, Dry, Intact 08/25/22 1351  Line Status Flushed;Saline locked;Blood return noted 08/25/22 1351  Dressing Type Transparent;Securing device 08/25/22 1351  Dressing Status Antimicrobial disc in place;Clean, Dry, Intact 08/25/22 1351  Safety Lock Intact 08/25/22 1351  Line Adjustment (NICU/IV Team Only) No 08/25/22 1351  Dressing Intervention New dressing;Other (Comment) 08/25/22 1351  Dressing Change Due 09/01/22 08/25/22 1351       Maximino Greenland 08/25/2022, 1:53 PM

## 2022-08-25 NOTE — Progress Notes (Signed)
  Progress Note    08/25/2022 9:22 AM 2 Days Post-Op  Subjective: FRAZER RAINVILLE is a 72 year old male who is now status post day 2 from I&D of transmetatarsal amputation and postop day 3 from a left lower extremity angiogram.  Patient has no complaints today.  She continues to work with PT for balance issues.  Vitals all remained stable.   Vitals:   08/25/22 0525 08/25/22 0713  BP: (!) 145/79 123/71  Pulse: 72 76  Resp: 18   Temp: 98.3 F (36.8 C) 98.6 F (37 C)  SpO2: 100% 99%   Physical Exam: Cardiac:  AFIB with tachycardia Lungs: Clear throughout, normal respiratory effort, no wheezing or rhonchi or rales to note. Incisions: Lower extremity per podiatry.  Has metatarsal amputation with dressing clean dry and intact. Extremities: Left lower extremity with transmetatarsal amputation.  Bandages clean dry and intact.  Right lower extremity with palpable pulses. Abdomen: Positive bowel sounds all 4 quadrants, soft, flat, nontender, nondistended. Neurologic: Alert and oriented x 4 and follows commands appropriately.  CBC    Component Value Date/Time   WBC 8.5 08/25/2022 0451   RBC 2.95 (L) 08/25/2022 0451   HGB 9.0 (L) 08/25/2022 0451   HCT 27.9 (L) 08/25/2022 0451   PLT 408 (H) 08/25/2022 0451   MCV 94.6 08/25/2022 0451   MCH 30.5 08/25/2022 0451   MCHC 32.3 08/25/2022 0451   RDW 12.8 08/25/2022 0451   LYMPHSABS 0.7 08/17/2022 0944   MONOABS 0.7 08/17/2022 0944   EOSABS 0.0 08/17/2022 0944   BASOSABS 0.0 08/17/2022 0944    BMET    Component Value Date/Time   NA 138 08/25/2022 0451   K 3.7 08/25/2022 0451   CL 104 08/25/2022 0451   CO2 26 08/25/2022 0451   GLUCOSE 121 (H) 08/25/2022 0451   GLUCOSE 402 (H) 09/17/2012 1417   BUN 15 08/25/2022 0451   CREATININE 1.19 08/25/2022 0451   CALCIUM 8.3 (L) 08/25/2022 0451   GFRNONAA >60 08/25/2022 0451   GFRAA 58 (L) 06/02/2015 0656    INR    Component Value Date/Time   INR 1.2 08/17/2022 0944     Intake/Output  Summary (Last 24 hours) at 08/25/2022 9147 Last data filed at 08/25/2022 0530 Gross per 24 hour  Intake 540 ml  Output 1900 ml  Net -1360 ml     Assessment/Plan:  72 y.o. male is s/p I&D and transmetatarsal amputation for recurrent infection of left lower extremity.  2 Days Post-Op patient is also postop day 3 from left lower extremity angiogram.  Plan: Patient being followed closely by podiatry.  They remove the packing yesterday which had scant bloody drainage on it.  There was no obvious purulence.  The skin flaps are all healing well and he had good capillary refill.  No necrosis was noted and his foot was warm to touch. Vascular surgery at this time will sign off the case.  If needed again in the future please reconsult thank you.  DVT prophylaxis: Eliquis 5 mg twice daily and Plavix 75 mg daily   Marcie Bal [facility] 08/25/2022 9:22 AM

## 2022-08-26 DIAGNOSIS — A419 Sepsis, unspecified organism: Secondary | ICD-10-CM | POA: Diagnosis not present

## 2022-08-26 LAB — COMPREHENSIVE METABOLIC PANEL
ALT: 64 U/L — ABNORMAL HIGH (ref 0–44)
AST: 68 U/L — ABNORMAL HIGH (ref 15–41)
Albumin: 2.2 g/dL — ABNORMAL LOW (ref 3.5–5.0)
Alkaline Phosphatase: 157 U/L — ABNORMAL HIGH (ref 38–126)
Anion gap: 8 (ref 5–15)
BUN: 18 mg/dL (ref 8–23)
CO2: 26 mmol/L (ref 22–32)
Calcium: 8.5 mg/dL — ABNORMAL LOW (ref 8.9–10.3)
Chloride: 104 mmol/L (ref 98–111)
Creatinine, Ser: 1.18 mg/dL (ref 0.61–1.24)
GFR, Estimated: >60 mL/min (ref >60)
Glucose, Bld: 117 mg/dL — ABNORMAL HIGH (ref 70–99)
Potassium: 3.4 mmol/L — ABNORMAL LOW (ref 3.5–5.1)
Sodium: 138 mmol/L (ref 135–145)
Total Bilirubin: 0.4 mg/dL (ref 0.3–1.2)
Total Protein: 6.6 g/dL (ref 6.5–8.1)

## 2022-08-26 LAB — CBC
HCT: 27.6 % — ABNORMAL LOW (ref 39.0–52.0)
Hemoglobin: 8.9 g/dL — ABNORMAL LOW (ref 13.0–17.0)
MCH: 30 pg (ref 26.0–34.0)
MCHC: 32.2 g/dL (ref 30.0–36.0)
MCV: 92.9 fL (ref 80.0–100.0)
Platelets: 417 10*3/uL — ABNORMAL HIGH (ref 150–400)
RBC: 2.97 MIL/uL — ABNORMAL LOW (ref 4.22–5.81)
RDW: 13.2 % (ref 11.5–15.5)
WBC: 8.5 10*3/uL (ref 4.0–10.5)
nRBC: 0 % (ref 0.0–0.2)

## 2022-08-26 LAB — GLUCOSE, CAPILLARY
Glucose-Capillary: 105 mg/dL — ABNORMAL HIGH (ref 70–99)
Glucose-Capillary: 192 mg/dL — ABNORMAL HIGH (ref 70–99)
Glucose-Capillary: 174 mg/dL — ABNORMAL HIGH (ref 70–99)
Glucose-Capillary: 206 mg/dL — ABNORMAL HIGH (ref 70–99)

## 2022-08-26 MED ORDER — POTASSIUM CHLORIDE CRYS ER 20 MEQ PO TBCR
40.0000 meq | EXTENDED_RELEASE_TABLET | Freq: Once | ORAL | Status: AC
  Administered 2022-08-26: 40 meq via ORAL
  Filled 2022-08-26: qty 2

## 2022-08-26 NOTE — Progress Notes (Signed)
Daily Progress Note   Subjective  - 3 Days Post-Op  Follow-up TMA status post I&D.  No complaints today.  Objective Vitals:   08/25/22 1509 08/25/22 1903 08/26/22 0405 08/26/22 0718  BP: 120/73 (!) 114/58 137/73 125/87  Pulse: 78 (!) 109 62 75  Resp: 16 20 20    Temp: 98.3 F (36.8 C) 99.5 F (37.5 C) 98.9 F (37.2 C) 97.8 F (36.6 C)  TempSrc:  Oral Oral Oral  SpO2: 97% 98% 98% 98%  Weight:      Height:        Physical Exam: Dressing changed to the left foot.  Minimal drainage noted at this time from the distal incision site.  Medial arch with no drainage.  Medial ankle without drainage.  Erythema has dramatically subsided.  No lymphangitic streaking.  No foul odor.    Laboratory CBC    Component Value Date/Time   WBC 8.5 08/26/2022 0500   HGB 8.9 (L) 08/26/2022 0500   HCT 27.6 (L) 08/26/2022 0500   PLT 417 (H) 08/26/2022 0500    BMET    Component Value Date/Time   NA 138 08/26/2022 0500   K 3.4 (L) 08/26/2022 0500   CL 104 08/26/2022 0500   CO2 26 08/26/2022 0500   GLUCOSE 117 (H) 08/26/2022 0500   GLUCOSE 402 (H) 09/17/2012 1417   BUN 18 08/26/2022 0500   CREATININE 1.18 08/26/2022 0500   CALCIUM 8.5 (L) 08/26/2022 0500   GFRNONAA >60 08/26/2022 0500   GFRAA 58 (L) 06/02/2015 0656   Recent cultures.  Most recent intraoperative culture from 4/24.4/19 culture shows Enterobacter and Enterococcus   Assessment/Planning: Status post I&D left transmetatarsal amputation site for gangrene and gas producing infection left foot  Diabetes with neuropathy  Patient wound continues to improve at this time.  Dressing changed today.  I suspect patient will be ready for discharge early next week. We discussed strict nonweightbearing to the left foot.  This is of utmost importance. Will recommend every other day dressing changes upon discharge once discharged to skilled nursing facility. Will need long-term IV antibiotics.  PICC line has been placed. He will follow-up  in the outpatient clinic in 2 weeks upon discharge. We discussed possible risks of reinfection down the road but for now no plans for return to OR.  [name] A  08/26/2022, 9:45 AM

## 2022-08-26 NOTE — Plan of Care (Signed)
  Problem: Coping: Goal: Ability to adjust to condition or change in health will improve Outcome: Progressing   Problem: Nutritional: Goal: Maintenance of adequate nutrition will improve Outcome: Progressing   Problem: Activity: Goal: Risk for activity intolerance will decrease Outcome: Progressing   Problem: Nutrition: Goal: Adequate nutrition will be maintained Outcome: Progressing   Problem: Pain Managment: Goal: General experience of comfort will improve Outcome: Progressing

## 2022-08-26 NOTE — Progress Notes (Signed)
PHARMACY CONSULT NOTE - FOLLOW UP  Pharmacy Consult for Electrolyte Monitoring and Replacement   Recent Labs: Potassium (mmol/L)  Date Value  08/26/2022 3.4 (L)   Magnesium (mg/dL)  Date Value  40/98/1191 1.7   Calcium (mg/dL)  Date Value  47/82/9562 8.5 (L)   Albumin (g/dL)  Date Value  13/11/6576 2.2 (L)   Sodium (mmol/L)  Date Value  08/26/2022 138   Corrected Ca: 9.94   Assessment: 72 yo male admitted with PMH of DM2, HLD, HTN, CAD admitted with sepsis secondary to left foot cellulitis. Pharmacy consulted for electrolyte monitoring and replacement.   Goal of Therapy:  Electrolytes WNL  Plan:  Will order KCL PO x 1 dose F/U AM labs and continue to replace electrolytes as needed.   Gardner Candle, PharmD, BCPS Clinical Pharmacist 08/26/2022 8:31 AM

## 2022-08-26 NOTE — Progress Notes (Signed)
  ID Brief note      Component 3 d ago  Specimen Description WOUND LEFT FOOT  Special Requests DRAIN  Gram Stain NO WBC SEEN NO ORGANISMS SEEN  Culture NO GROWTH 3 DAYS NO ANAEROBES ISOLATED; CULTURE IN PROGRESS FOR 5 DAYS Performed at [facility], [address] [place], [place] [address]  Report Status PENDING     Odette Fraction, MD Infectious Disease Physician [facility] [address] [place], [place] [address] Phone: [phone]  Fax: [phone]

## 2022-08-26 NOTE — Progress Notes (Signed)
PROGRESS NOTE    Andrew Harding  WJX:[id number] DOB: 02/10/1951 DOA: 08/17/2022 PCP: Lynnea Ferrier, MD   Brief Narrative:  72 year old with history of poorly controlled insulin-dependent DM 2, peripheral neuropathy, HLD, HTN, CAD status post CABG sent to the hospital from Dr. Irene Limbo office due to worsening left foot swelling and erythema.  Patient also had a syncopal episode.  Patient underwent left lower extremity angiogram by vascular on 4/23.  Successful recannulization was performed with improvement in blood flows.  Infectious disease is currently following, antibiotics per their recommendations.  Patient underwent I&D of multiple sites of left foot and left ankle by podiatry, cultures were sent. PICC line placed on 4/26. Now awaiting podiatry to clear the patient for discharge to rehab.    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  ID, vascular and podiatry following - Continue meropenem. 4-6 weeks of Abx per ID.  -Underwent LLE angiogram by vascular 4/23, successful recannulization and improvement in blood flows -Status post I&D by podiatry on 4/24.  Will need purulent discharge to resolve per podiatry prior to discharge. -Follow culture data  PICC PLACED 08/24/25  --WC MODERATE ENTEROBACTER CLOACAE  MODERATE ENTEROCOCCUS FAECALIS     Atrial fibrillation, New, rate controlled H/o CAD,s/p CABG x1 in 2014 Seen by [facility] cardiology.  On Eliquis and Plavix. Discontinue  aspirin Echo shows preserved EF  Transaminitis, improved - Hepatitis panel negative.  Repeat CMP with PCP outpatient  Peripheral arterial disease - Now on Eliquis and aspirin.  Eventually will need statin once LFTs improved - LDL 54   Uncontrolled type II diabetes with peripheral neuropathy and CKD stage IIIB -A1c 6.9.  Sliding scale and Accu-Cheks -Gabapentin - Semglee 50 units bedtime.  Sliding scale and Accu-Chek   Hypotension with syncopal episode in the setting of pre-renal azotemia in post-op period CT head, CTA chest unremarkable.  Echocardiogram shows preserved EF   CKD stage IIIB -Creatinine stable around 1.2   Hyperlipidemia -- continue statins   DVT prophylaxis: eLIQUIS Code Status: Full Code Family Communication:   Status is: Inpatient Awaiting purulent discharge clearance per podiatry.  Once resolved, patient will be discharge.    Diet Orders (From admission, onward)     Start     Ordered   08/23/22 1648  Diet Carb Modified Fluid consistency: Thin; Room service appropriate? Yes  Diet effective now       Question Answer Comment  Diet-HS Snack? Nothing   Calorie Level Medium 1600-2000   Fluid consistency: Thin   Room service appropriate? Yes      08/23/22 1647            Subjective: Doing ok no complaints  Examination: Constitutional: Not in acute distress Respiratory: Clear to auscultation bilaterally Cardiovascular: Normal sinus rhythm, no rubs Abdomen: Nontender nondistended good bowel sounds Musculoskeletal: No edema noted Skin: LLE dressing in place.  Neurologic: CN 2-12 grossly intact.  And nonfocal Psychiatric: Normal judgment and insight. Alert  and oriented x 3. Normal mood.    Objective: Vitals:   08/25/22 1509 08/25/22 1903 08/26/22 0405 08/26/22 0718  BP: 120/73 (!) 114/58 137/73 125/87  Pulse: 78 (!) 109 62 75  Resp: 16 20 20    Temp: 98.3 F (36.8 C) 99.5 F (37.5 C) 98.9 F (37.2 C) 97.8 F (36.6 C)  TempSrc:  Oral Oral  Oral  SpO2: 97% 98% 98% 98%  Weight:      Height:        Intake/Output Summary (Last 24 hours) at 08/26/2022 0816 Last data filed at 08/26/2022 0409 Gross per 24 hour  Intake 240 ml  Output 650 ml  Net -410 ml   Filed Weights   08/17/22 0913  Weight: 113.4 kg    Scheduled Meds:  apixaban  5 mg Oral BID   Chlorhexidine Gluconate Cloth  6 each Topical Daily   cholecalciferol  2,000 Units Oral Daily   clopidogrel  75 mg Oral Q breakfast   docusate sodium  100 mg Oral BID   feeding supplement  237 mL Oral BID BM   gabapentin  300 mg Oral TID   insulin aspart  0-5 Units Subcutaneous QHS   insulin aspart  0-9 Units Subcutaneous TID WC   insulin glargine-yfgn  50 Units Subcutaneous QHS   metoprolol tartrate  25 mg Oral BID   sodium chloride flush  10-40 mL Intracatheter Q12H   sodium chloride flush  3 mL Intravenous Q12H   Continuous Infusions:  sodium chloride 10 mL/hr at 08/23/22 0643   sodium chloride     meropenem (MERREM) IV 1 g (08/26/22 0510)    Nutritional status     Body mass index is 31.25 kg/m.  Data Reviewed:   CBC: Recent Labs  Lab 08/22/22 0716 08/23/22 0210 08/24/22 0158 08/25/22 0451 08/26/22 0500  WBC 10.6* 9.4 9.6 8.5 8.5  HGB 8.5* 8.8* 9.0* 9.0* 8.9*  HCT 25.9* 27.6* 28.0* 27.9* 27.6*  MCV 92.8 94.5 94.0 94.6 92.9  PLT 302 356 354 408* 417*   Basic Metabolic Panel: Recent Labs  Lab 08/21/22 0515 08/21/22 0517 08/22/22 0716 08/23/22 0210 08/24/22 1020 08/25/22 0451 08/26/22 0500  NA  --   --  135 133* 136 138 138  K  --   --  3.9 4.2 3.6 3.7 3.4*  CL  --   --  104 103 106 104 104  CO2  --   --  23 22 21* 26 26  GLUCOSE  --   --  113* 242* 154* 121* 117*  BUN  --   --  20 21 14 15 18   CREATININE  --    < > 1.26* 1.27* 1.18 1.19 1.18  CALCIUM  --   --  8.2* 8.1* 8.3* 8.3* 8.5*  MG 1.7  --   --   --   --   --   --    < > = values in this interval not displayed.   GFR: Estimated Creatinine Clearance: 75.8 mL/min (by C-G formula  based on SCr of 1.18 mg/dL). Liver Function Tests: Recent Labs  Lab 08/22/22 0716 08/23/22 0210 08/24/22 1020 08/25/22 0451 08/26/22 0500  AST 157* 98* 49* 51* 68*  ALT 119* 99* 69* 63* 64*  ALKPHOS 162* 183* 172* 163* 157*  BILITOT 0.6 0.7 0.6 0.7 0.4  PROT 6.3* 6.3* 6.6 6.6 6.6  ALBUMIN 2.2* 2.2* 2.2* 2.2* 2.2*   No results for input(s): "LIPASE", "AMYLASE" in the last 168 hours. No  results for input(s): "AMMONIA" in the last 168 hours. Coagulation Profile: No results for input(s): "INR", "PROTIME" in the last 168 hours.  Cardiac Enzymes: No results for input(s): "CKTOTAL", "CKMB", "CKMBINDEX", "TROPONINI" in the last 168 hours.  BNP (last 3 results) No results for input(s): "PROBNP" in the last 8760 hours. HbA1C: No results for input(s): "HGBA1C" in the last 72 hours. CBG: Recent Labs  Lab 08/25/22 0815 08/25/22 1137 08/25/22 1727 08/25/22 2059 08/26/22 0754  GLUCAP 119* 216* 162* 148* 105*   Lipid Profile: Recent Labs    08/24/22 1020  CHOL 92  HDL 17*  LDLCALC 54  TRIG 161  CHOLHDL 5.4   Thyroid Function Tests: No results for input(s): "TSH", "T4TOTAL", "FREET4", "T3FREE", "THYROIDAB" in the last 72 hours. Anemia Panel: No results for input(s): "VITAMINB12", "FOLATE", "FERRITIN", "TIBC", "IRON", "RETICCTPCT" in the last 72 hours. Sepsis Labs: No results for input(s): "PROCALCITON", "LATICACIDVEN" in the last 168 hours.   Recent Results (from the past 240 hour(s))  Culture, blood (Routine x 2)     Status: None   Collection Time: 08/17/22  9:44 AM   Specimen: BLOOD RIGHT FOREARM  Result Value Ref Range Status   Specimen Description BLOOD RIGHT FOREARM  Final   Special Requests   Final    BOTTLES DRAWN AEROBIC AND ANAEROBIC Blood Culture adequate volume   Culture   Final    NO GROWTH 5 DAYS Performed at [facility], [address] [place], [place] [address]    Report Status 08/22/2022 FINAL  Final  Culture, blood (Routine x 2)      Status: None   Collection Time: 08/17/22  9:44 AM   Specimen: BLOOD RIGHT WRIST  Result Value Ref Range Status   Specimen Description BLOOD RIGHT WRIST  Final   Special Requests   Final    BOTTLES DRAWN AEROBIC AND ANAEROBIC Blood Culture results may not be optimal due to an inadequate volume of blood received in culture bottles   Culture   Final    NO GROWTH 5 DAYS Performed at [facility], [address], [place], [place] [address]    Report Status 08/22/2022 FINAL  Final  Aerobic Culture w Gram Stain (superficial specimen)     Status: None   Collection Time: 08/18/22 10:39 AM   Specimen: Wound  Result Value Ref Range Status   Specimen Description   Final    WOUND Performed at [facility], [address] [place], [place] [address]    Special Requests   Final    LEFT TOE Performed at [facility], [address] [place], [place] [address]    Gram Stain   Final    FEW WBC PRESENT, PREDOMINANTLY PMN MODERATE GRAM NEGATIVE RODS FEW GRAM POSITIVE COCCI Performed at [facility], [address], [place], [place] [address]    Culture   Final    MODERATE ENTEROBACTER CLOACAE MODERATE ENTEROCOCCUS FAECALIS    Report Status 08/21/2022 FINAL  Final   Organism ID, Bacteria ENTEROBACTER CLOACAE  Final   Organism ID, Bacteria ENTEROCOCCUS FAECALIS  Final      Susceptibility   Enterobacter cloacae - MIC*    CEFEPIME <=0.12 SENSITIVE Sensitive     CEFTAZIDIME <=1 SENSITIVE Sensitive     CIPROFLOXACIN <=0.25 SENSITIVE Sensitive     GENTAMICIN <=1 SENSITIVE Sensitive     IMIPENEM 1 SENSITIVE Sensitive     TRIMETH/SULFA >=320 RESISTANT Resistant     PIP/TAZO <=4 SENSITIVE Sensitive     *  MODERATE ENTEROBACTER CLOACAE   Enterococcus faecalis - MIC*    AMPICILLIN <=2 SENSITIVE Sensitive     VANCOMYCIN 1 SENSITIVE Sensitive     GENTAMICIN SYNERGY SENSITIVE Sensitive     * MODERATE ENTEROCOCCUS FAECALIS  Aerobic/Anaerobic Culture w Gram Stain  (surgical/deep wound)     Status: None (Preliminary result)   Collection Time: 08/23/22  2:43 PM   Specimen: Path fluid; Body Fluid  Result Value Ref Range Status   Specimen Description WOUND LEFT FOOT  Final   Special Requests DRAIN  Final   Gram Stain NO WBC SEEN NO ORGANISMS SEEN   Final   Culture   Final    HOLDING FOR POSSIBLE ANAEROBE Performed at [facility], [address] [place], [place] [address]    Report Status PENDING  Incomplete         Radiology Studies: DG Chest Port 1 View  Result Date: 08/25/2022 CLINICAL DATA:  Status post PICC line placement EXAM: PORTABLE CHEST 1 VIEW COMPARISON:  08/17/2022 FINDINGS: Right arm PICC line tip terminates in the superior cavoatrial junction. Heart size is normal. No pleural fluid or airspace disease. Visualized osseous structures are unremarkable. IMPRESSION: Right arm PICC line tip terminates in the superior cavoatrial junction. Electronically Signed   By: Signa Kell M.D.   On: 08/25/2022 14:16   [place] EKG SITE RITE  Result Date: 08/25/2022 If Site Rite image not attached, placement could not be confirmed due to current cardiac rhythm.          LOS: 9 days   Time spent= 35 mins    Wendle Kina Joline Maxcy, MD [facility]  If 7PM-7AM, please contact night-coverage  08/26/2022, 8:16 AM

## 2022-08-27 DIAGNOSIS — A419 Sepsis, unspecified organism: Secondary | ICD-10-CM | POA: Diagnosis not present

## 2022-08-27 LAB — COMPREHENSIVE METABOLIC PANEL
ALT: 64 U/L — ABNORMAL HIGH (ref 0–44)
AST: 67 U/L — ABNORMAL HIGH (ref 15–41)
Albumin: 2.3 g/dL — ABNORMAL LOW (ref 3.5–5.0)
Alkaline Phosphatase: 133 U/L — ABNORMAL HIGH (ref 38–126)
Anion gap: 4 — ABNORMAL LOW (ref 5–15)
BUN: 21 mg/dL (ref 8–23)
CO2: 29 mmol/L (ref 22–32)
Calcium: 8.3 mg/dL — ABNORMAL LOW (ref 8.9–10.3)
Chloride: 103 mmol/L (ref 98–111)
Creatinine, Ser: 1.21 mg/dL (ref 0.61–1.24)
GFR, Estimated: >60 mL/min (ref >60)
Glucose, Bld: 107 mg/dL — ABNORMAL HIGH (ref 70–99)
Potassium: 3.9 mmol/L (ref 3.5–5.1)
Sodium: 136 mmol/L (ref 135–145)
Total Bilirubin: 0.5 mg/dL (ref 0.3–1.2)
Total Protein: 6.7 g/dL (ref 6.5–8.1)

## 2022-08-27 LAB — CBC
HCT: 28.4 % — ABNORMAL LOW (ref 39.0–52.0)
Hemoglobin: 9 g/dL — ABNORMAL LOW (ref 13.0–17.0)
MCH: 29.8 pg (ref 26.0–34.0)
MCHC: 31.7 g/dL (ref 30.0–36.0)
MCV: 94 fL (ref 80.0–100.0)
Platelets: 430 10*3/uL — ABNORMAL HIGH (ref 150–400)
RBC: 3.02 MIL/uL — ABNORMAL LOW (ref 4.22–5.81)
RDW: 13 % (ref 11.5–15.5)
WBC: 8.7 10*3/uL (ref 4.0–10.5)
nRBC: 0 % (ref 0.0–0.2)

## 2022-08-27 LAB — AEROBIC/ANAEROBIC CULTURE W GRAM STAIN (SURGICAL/DEEP WOUND)

## 2022-08-27 LAB — GLUCOSE, CAPILLARY
Glucose-Capillary: 112 mg/dL — ABNORMAL HIGH (ref 70–99)
Glucose-Capillary: 208 mg/dL — ABNORMAL HIGH (ref 70–99)
Glucose-Capillary: 140 mg/dL — ABNORMAL HIGH (ref 70–99)
Glucose-Capillary: 230 mg/dL — ABNORMAL HIGH (ref 70–99)

## 2022-08-27 LAB — MAGNESIUM: Magnesium: 2 mg/dL (ref 1.7–2.4)

## 2022-08-27 NOTE — TOC Progression Note (Signed)
Transition of Care [facility]) - Progression Note    Patient Details  Name: Andrew Harding MRN: [id number] Date of Birth: [birth year]  [facility] CM/SW Contact  Carmina Miller, LCSWA Phone Number: 08/27/2022, 9:14 AM  Clinical Narrative:     Per CIR admissions [facility] still pending, no bed available today. TOC will continue to follow.        Expected Discharge Plan and Services                                               Social Determinants of Health (SDOH) Interventions SDOH Screenings   Food Insecurity: No Food Insecurity (08/19/2022)  Housing: Low Risk  (08/19/2022)  Transportation Needs: No Transportation Needs (08/19/2022)  Utilities: Not At Risk (08/19/2022)  Tobacco Use: High Risk (08/24/2022)    Readmission Risk Interventions     No data to display

## 2022-08-27 NOTE — Progress Notes (Signed)
Occupational Therapy Treatment Patient Details Name: Andrew Harding MRN: [id number] DOB: [birth year] Today's Date: 08/27/2022   History of present illness Andrew Harding is a 72 year old male with recent toe amputations on left foot. Found to have worsening infection with gangrene and osteomyelitis. 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 History of diabetes mellitus.   OT comments  [name] demonstrated good progress today. He was able to stand and ambulate with greater stability and lower level of assistance than required in previous week. He is able to recall Wbing precautions and can don p/s boot with Mod I in sitting. He is able to perform bed mobility with Mod I-SUPV and transfers w/ CGA. Provided educ re: importance of OOB physical activity, encouraging pt to begin transferring to toilet rather than using condom cath, performing grooming in standing rather than sitting, etc., while adhering to NWB on L LE. Pt verbalizes understanding. Continue to recommend inpt rehab to allow pt to return to PLOF.   Recommendations for follow up therapy are one component of a multi-disciplinary discharge planning process, led by the attending physician.  Recommendations may be updated based on patient status, additional functional criteria and insurance authorization.    Assistance Recommended at Discharge Intermittent Supervision/Assistance  Patient can return home with the following  A little help with walking and/or transfers;A little help with bathing/dressing/bathroom;Assistance with cooking/housework;Assist for transportation;Help with stairs or ramp for entrance   Equipment Recommendations       Recommendations for Other Services      Precautions / Restrictions Precautions Precautions: Fall Restrictions Weight Bearing Restrictions: Yes LLE Weight Bearing: Non weight bearing Other Position/Activity Restrictions: post-op shoe       Mobility Bed Mobility Overal bed mobility: Needs  Assistance Bed Mobility: Supine to Sit     Supine to sit: HOB elevated, Supervision     General bed mobility comments: no physical assistance required to achieve EOB short sit.    Transfers Overall transfer level: Needs assistance Equipment used: Rolling walker (2 wheels) Transfers: Sit to/from Stand, Bed to chair/wheelchair/BSC Sit to Stand: Min guard     Step pivot transfers: Min guard           Balance Overall balance assessment: Needs assistance Sitting-balance support: Feet supported Sitting balance-Leahy Scale: Good     Standing balance support: Bilateral upper extremity supported, During functional activity, Reliant on assistive device for balance Standing balance-Leahy Scale: Fair                             ADL either performed or assessed with clinical judgement   ADL Overall ADL's : Needs assistance/impaired                     Lower Body Dressing: Supervision/safety               Functional mobility during ADLs: Min guard;Rolling walker (2 wheels) General ADL Comments: Min guard for functional mobiltiy with RW across short distance from EOB to sink (~4') Upon return to bed, x1 instance of posterior LOB appreciated with assist required for safe lowering onto seated surface and to maintain NWB. Continues to require close contact guard during functional mobility and multimodal cueing to maintain NWB status during functional mobiltiy.    Extremity/Trunk Assessment Upper Extremity Assessment Upper Extremity Assessment: Overall WFL for tasks assessed   Lower Extremity Assessment Lower Extremity Assessment: LLE deficits/detail RLE Sensation: history of peripheral neuropathy LLE  Deficits / Details: transmet amputation. patient is able to activate hip/knee/ankle movement LLE Sensation: history of peripheral neuropathy        Vision       Perception     Praxis      Cognition Arousal/Alertness: Awake/alert Behavior During  Therapy: WFL for tasks assessed/performed Overall Cognitive Status: Within Functional Limits for tasks assessed                                          Exercises      Shoulder Instructions       General Comments      Pertinent Vitals/ Pain       Pain Assessment Pain Score: 0-No pain  Home Living                                          Prior Functioning/Environment              Frequency  Min 3X/week        Progress Toward Goals  OT Goals(current goals can now be found in the care plan section)  Progress towards OT goals: Progressing toward goals  Acute Rehab OT Goals OT Goal Formulation: With patient Time For Goal Achievement: 09/03/22 Potential to Achieve Goals: Good  Plan Discharge plan remains appropriate;Frequency remains appropriate    Co-evaluation                 AM-PAC OT "6 Clicks" Daily Activity     Outcome Measure   Help from another person eating meals?: None Help from another person taking care of personal grooming?: A Little Help from another person toileting, which includes using toliet, bedpan, or urinal?: A Little Help from another person bathing (including washing, rinsing, drying)?: A Lot Help from another person to put on and taking off regular upper body clothing?: None Help from another person to put on and taking off regular lower body clothing?: A Little 6 Click Score: 19    End of Session Equipment Utilized During Treatment: Rolling walker (2 wheels)  OT Visit Diagnosis: Other abnormalities of gait and mobility (R26.89)   Activity Tolerance Patient tolerated treatment well   Patient Left in chair;with call bell/phone within reach   Nurse Communication          Time: 1450-1510 OT Time Calculation (min): 20 min  Charges: OT General Charges $OT Visit: 1 Visit OT Treatments $Self Care/Home Management : 8-22 mins Latina Craver, PhD, MS, OTR/L 08/27/22, 3:22 PM

## 2022-08-27 NOTE — Plan of Care (Signed)
  Problem: Skin Integrity: Goal: Risk for impaired skin integrity will decrease Outcome: Progressing   Problem: Tissue Perfusion: Goal: Adequacy of tissue perfusion will improve Outcome: Progressing   Problem: Activity: Goal: Risk for activity intolerance will decrease Outcome: Progressing   Problem: Coping: Goal: Level of anxiety will decrease Outcome: Progressing   Problem: Pain Managment: Goal: General experience of comfort will improve Outcome: Progressing   Problem: Safety: Goal: Ability to remain free from injury will improve Outcome: Progressing

## 2022-08-27 NOTE — Progress Notes (Signed)
PROGRESS NOTE    Andrew Harding  WJX:[id number] DOB: 14-Dec-1950 DOA: 08/17/2022 PCP: Lynnea Ferrier, MD   Brief Narrative:  72 year old with history of poorly controlled insulin-dependent DM 2, peripheral neuropathy, HLD, HTN, CAD status post CABG sent to the hospital from Dr. Irene Limbo office due to worsening left foot swelling and erythema.  Patient also had a syncopal episode.  Patient underwent left lower extremity angiogram by vascular on 4/23.  Successful recannulization was performed with improvement in blood flows.  Infectious disease is currently following, antibiotics per their recommendations.  Patient underwent I&D of multiple sites of left foot and left ankle by podiatry, cultures were sent. PICC line placed on 4/26. Now awaiting podiatry to clear the patient for discharge to rehab.    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  ID, vascular and podiatry following - Continue meropenem. 4-6 weeks of Abx per ID.  -Underwent LLE angiogram by vascular 4/23, successful recannulization and improvement in blood flows -Status post I&D by podiatry on 4/24.  Podiatry following -Recs: Non weight bearing left foot. Change dressing every other day per Podiatry. Follow up with Podiatry in 2 weeks   PICC PLACED 08/24/25  Wound culture 4/19: MODERATE ENTEROBACTER CLOACAE  MODERATE ENTEROCOCCUS FAECALIS     Atrial fibrillation, New, rate controlled H/o CAD,s/p CABG x1 in  2014 Seen by [facility] cardiology.  On Eliquis and Plavix. Discontinue aspirin Echo shows preserved EF  Transaminitis, improved - Hepatitis panel negative.  Repeat CMP with PCP outpatient  Peripheral arterial disease - Now on Eliquis and aspirin.  Eventually will need statin once LFTs improved - LDL 54   Uncontrolled type II diabetes with peripheral neuropathy and CKD stage IIIB -A1c 6.9.  Sliding scale and Accu-Cheks -Gabapentin - Semglee 50 units bedtime.  Sliding scale and Accu-Chek   Hypotension with syncopal episode in the setting of pre-renal azotemia in post-op period CT head, CTA chest unremarkable.  Echocardiogram shows preserved EF   CKD stage IIIB -Creatinine stable around 1.2   Hyperlipidemia -- continue statins   DVT prophylaxis: eLIQUIS Code Status: Full Code Family Communication:   Status is: Inpatient Awaiting purulent discharge clearance per podiatry.  Once resolved, patient will be discharge.    Diet Orders (From admission, onward)     Start     Ordered   08/23/22 1648  Diet Carb Modified Fluid consistency: Thin; Room service appropriate? Yes  Diet effective now       Question Answer Comment  Diet-HS Snack? Nothing   Calorie Level Medium 1600-2000   Fluid consistency: Thin   Room service appropriate? Yes      08/23/22 1647            Subjective: Doing ok no complaints  Examination: Constitutional: Not in acute distress Respiratory: Clear to auscultation bilaterally Cardiovascular: Normal sinus rhythm, no rubs Abdomen: Nontender nondistended good bowel sounds Musculoskeletal: No edema noted Skin: left foot dressing in place Neurologic: CN 2-12  grossly intact.  And nonfocal Psychiatric: Normal judgment and insight. Alert and oriented x 3. Normal mood.    Objective: Vitals:   08/26/22 0718 08/26/22 1533 08/26/22 1859 08/27/22 0408  BP: 125/87 111/79 124/76 129/72  Pulse: 75 63 (!) 101 87  Resp:  18 16 16   Temp: 97.8 F (36.6 C) 98.4 F  (36.9 C) 98.5 F (36.9 C) 98.5 F (36.9 C)  TempSrc: Oral Oral Oral Oral  SpO2: 98% 99% 99% 98%  Weight:      Height:        Intake/Output Summary (Last 24 hours) at 08/27/2022 0809 Last data filed at 08/27/2022 0530 Gross per 24 hour  Intake 597 ml  Output 1600 ml  Net -1003 ml   Filed Weights   08/17/22 0913  Weight: 113.4 kg    Scheduled Meds:  apixaban  5 mg Oral BID   Chlorhexidine Gluconate Cloth  6 each Topical Daily   cholecalciferol  2,000 Units Oral Daily   clopidogrel  75 mg Oral Q breakfast   docusate sodium  100 mg Oral BID   feeding supplement  237 mL Oral BID BM   gabapentin  300 mg Oral TID   insulin aspart  0-5 Units Subcutaneous QHS   insulin aspart  0-9 Units Subcutaneous TID WC   insulin glargine-yfgn  50 Units Subcutaneous QHS   metoprolol tartrate  25 mg Oral BID   sodium chloride flush  10-40 mL Intracatheter Q12H   sodium chloride flush  3 mL Intravenous Q12H   Continuous Infusions:  sodium chloride 10 mL/hr at 08/23/22 0643   sodium chloride     meropenem (MERREM) IV 1 g (08/27/22 0506)    Nutritional status     Body mass index is 31.25 kg/m.  Data Reviewed:   CBC: Recent Labs  Lab 08/23/22 0210 08/24/22 0158 08/25/22 0451 08/26/22 0500 08/27/22 0511  WBC 9.4 9.6 8.5 8.5 8.7  HGB 8.8* 9.0* 9.0* 8.9* 9.0*  HCT 27.6* 28.0* 27.9* 27.6* 28.4*  MCV 94.5 94.0 94.6 92.9 94.0  PLT 356 354 408* 417* 430*   Basic Metabolic Panel: Recent Labs  Lab 08/21/22 0515 08/21/22 0517 08/23/22 0210 08/24/22 1020 08/25/22 0451 08/26/22 0500 08/27/22 0511  NA  --    < > 133* 136 138 138 136  K  --    < > 4.2 3.6 3.7 3.4* 3.9  CL  --    < > 103 106 104 104 103  CO2  --    < > 22 21* 26 26 29   GLUCOSE  --    < > 242* 154* 121* 117* 107*  BUN  --    < > 21 14 15 18 21   CREATININE  --    < > 1.27* 1.18 1.19 1.18 1.21  CALCIUM  --    < > 8.1* 8.3* 8.3* 8.5* 8.3*  MG 1.7  --   --   --   --   --  2.0   < > = values in this interval not  displayed.   GFR: Estimated Creatinine Clearance: 73.9 mL/min (by C-G formula based on SCr of 1.21 mg/dL). Liver Function Tests: Recent Labs  Lab 08/23/22 0210 08/24/22 1020 08/25/22 0451 08/26/22 0500 08/27/22 0511  AST 98* 49* 51* 68* 67*  ALT 99* 69* 63* 64* 64*  ALKPHOS 183* 172* 163* 157* 133*  BILITOT 0.7 0.6 0.7 0.4 0.5  PROT 6.3* 6.6 6.6 6.6 6.7  ALBUMIN 2.2* 2.2* 2.2* 2.2*  2.3*   No results for input(s): "LIPASE", "AMYLASE" in the last 168 hours. No results for input(s): "AMMONIA" in the last 168 hours. Coagulation Profile: No results for input(s): "INR", "PROTIME" in the last 168 hours.  Cardiac Enzymes: No results for input(s): "CKTOTAL", "CKMB", "CKMBINDEX", "TROPONINI" in the last 168 hours.  BNP (last 3 results) No results for input(s): "PROBNP" in the last 8760 hours. HbA1C: No results for input(s): "HGBA1C" in the last 72 hours. CBG: Recent Labs  Lab 08/25/22 2059 08/26/22 0754 08/26/22 1143 08/26/22 1533 08/26/22 2126  GLUCAP 148* 105* 192* 174* 206*   Lipid Profile: Recent Labs    08/24/22 1020  CHOL 92  HDL 17*  LDLCALC 54  TRIG 098  CHOLHDL 5.4   Thyroid Function Tests: No results for input(s): "TSH", "T4TOTAL", "FREET4", "T3FREE", "THYROIDAB" in the last 72 hours. Anemia Panel: No results for input(s): "VITAMINB12", "FOLATE", "FERRITIN", "TIBC", "IRON", "RETICCTPCT" in the last 72 hours. Sepsis Labs: No results for input(s): "PROCALCITON", "LATICACIDVEN" in the last 168 hours.   Recent Results (from the past 240 hour(s))  Culture, blood (Routine x 2)     Status: None   Collection Time: 08/17/22  9:44 AM   Specimen: BLOOD RIGHT FOREARM  Result Value Ref Range Status   Specimen Description BLOOD RIGHT FOREARM  Final   Special Requests   Final    BOTTLES DRAWN AEROBIC AND ANAEROBIC Blood Culture adequate volume   Culture   Final    NO GROWTH 5 DAYS Performed at [facility], [address] [place], [place] [address]     Report Status 08/22/2022 FINAL  Final  Culture, blood (Routine x 2)     Status: None   Collection Time: 08/17/22  9:44 AM   Specimen: BLOOD RIGHT WRIST  Result Value Ref Range Status   Specimen Description BLOOD RIGHT WRIST  Final   Special Requests   Final    BOTTLES DRAWN AEROBIC AND ANAEROBIC Blood Culture results may not be optimal due to an inadequate volume of blood received in culture bottles   Culture   Final    NO GROWTH 5 DAYS Performed at [facility], [address], [place], [place] [address]    Report Status 08/22/2022 FINAL  Final  Aerobic Culture w Gram Stain (superficial specimen)     Status: None   Collection Time: 08/18/22 10:39 AM   Specimen: Wound  Result Value Ref Range Status   Specimen Description   Final    WOUND Performed at [facility], [address], [place], [place] [address]    Special Requests   Final    LEFT TOE Performed at [facility], [address] [place], [place] [address]    Gram Stain   Final    FEW WBC PRESENT, PREDOMINANTLY PMN MODERATE GRAM NEGATIVE RODS FEW GRAM POSITIVE COCCI Performed at [facility], [address], [place], [place] [address]    Culture   Final    MODERATE ENTEROBACTER CLOACAE MODERATE ENTEROCOCCUS FAECALIS    Report Status 08/21/2022 FINAL  Final   Organism ID, Bacteria ENTEROBACTER CLOACAE  Final   Organism ID, Bacteria ENTEROCOCCUS FAECALIS  Final      Susceptibility   Enterobacter cloacae - MIC*    CEFEPIME <=0.12 SENSITIVE Sensitive     CEFTAZIDIME <=1 SENSITIVE Sensitive     CIPROFLOXACIN <=0.25 SENSITIVE Sensitive     GENTAMICIN <=1 SENSITIVE Sensitive     IMIPENEM 1 SENSITIVE Sensitive  TRIMETH/SULFA >=320 RESISTANT Resistant     PIP/TAZO <=4 SENSITIVE Sensitive     * MODERATE ENTEROBACTER CLOACAE   Enterococcus faecalis - MIC*    AMPICILLIN <=2 SENSITIVE Sensitive     VANCOMYCIN 1 SENSITIVE Sensitive     GENTAMICIN SYNERGY SENSITIVE Sensitive     *  MODERATE ENTEROCOCCUS FAECALIS  Aerobic/Anaerobic Culture w Gram Stain (surgical/deep wound)     Status: None (Preliminary result)   Collection Time: 08/23/22  2:43 PM   Specimen: Path fluid; Body Fluid  Result Value Ref Range Status   Specimen Description WOUND LEFT FOOT  Final   Special Requests DRAIN  Final   Gram Stain NO WBC SEEN NO ORGANISMS SEEN   Final   Culture   Final    NO GROWTH 3 DAYS NO ANAEROBES ISOLATED; CULTURE IN PROGRESS FOR 5 DAYS Performed at [facility], [address] [place], [place] [address]    Report Status PENDING  Incomplete         Radiology Studies: DG Chest Port 1 View  Result Date: 08/25/2022 CLINICAL DATA:  Status post PICC line placement EXAM: PORTABLE CHEST 1 VIEW COMPARISON:  08/17/2022 FINDINGS: Right arm PICC line tip terminates in the superior cavoatrial junction. Heart size is normal. No pleural fluid or airspace disease. Visualized osseous structures are unremarkable. IMPRESSION: Right arm PICC line tip terminates in the superior cavoatrial junction. Electronically Signed   By: Signa Kell M.D.   On: 08/25/2022 14:16           LOS: 10 days   Time spent= 35 mins    Tacari Repass Joline Maxcy, MD [facility]  If 7PM-7AM, please contact night-coverage  08/27/2022, 8:09 AM

## 2022-08-27 NOTE — Progress Notes (Signed)
PHARMACY CONSULT NOTE  Pharmacy Consult for Electrolyte Monitoring and Replacement   Recent Labs: Potassium (mmol/L)  Date Value  08/27/2022 3.9   Magnesium (mg/dL)  Date Value  16/01/9603 2.0   Calcium (mg/dL)  Date Value  54/12/8117 8.3 (L)   Albumin (g/dL)  Date Value  14/78/2956 2.3 (L)   Sodium (mmol/L)  Date Value  08/27/2022 136    Assessment: 72 yo male admitted with PMH of DM2, HLD, HTN, CAD admitted with sepsis secondary to left foot cellulitis. Pharmacy consulted for electrolyte monitoring and replacement.   Goal of Therapy:  Electrolytes within normal limits  Plan:  --Electrolytes stable and not requiring replacement. Will discontinue electrolyte consult at this time. Defer further ordering of labs and electrolyte replacement to primary team. Pharmacy will continue to follow along peripherally  Tressie Ellis 08/27/2022 7:30 AM

## 2022-08-27 NOTE — Treatment Plan (Signed)
ID Brief note   Diagnosis: DFU/osteomyelitis   Culture Result: Enterobacter cloacae, E faecalis  No Known Allergies  OPAT Orders Discharge antibiotics to be given via PICC line Discharge antibiotics: meropenem  Per pharmacy protocol  Duration: 4 to 6 weeks End Date: 09/19/22 [facility] Care Per Protocol:  Home health RN for IV administration and teaching; PICC line care and labs.    Labs weekly while on IV antibiotics: X__ CBC with differential __ BMP X__ CMP X__ CRP X__ ESR __ Vancomycin trough __ CK  __ Please pull PIC at completion of IV antibiotics X__ Please leave PIC in place until doctor has seen patient or been notified  Fax weekly labs to [phone]  Clinic Follow Up Appt: 09/14/22 @ 8: 45 am   Dr Rivka Safer  Odette Fraction, MD Infectious Disease Physician [facility] [address] [place], [place] [address] Phone: [phone]  Fax: [phone]

## 2022-08-27 NOTE — Progress Notes (Signed)
Daily Progress Note   Subjective  - 4 Days Post-Op  Follow-up status post 4 days I&D left foot.  No complaints.  Objective Vitals:   08/26/22 1859 08/27/22 0408 08/27/22 0818 08/27/22 0818  BP: 124/76 129/72 108/64 108/64  Pulse: (!) 101 87 85 85  Resp: 16 16 16 16   Temp: 98.5 F (36.9 C) 98.5 F (36.9 C) 98.1 F (36.7 C) 98.1 F (36.7 C)  TempSrc: Oral Oral Oral   SpO2: 99% 98% 97% 97%  Weight:      Height:        Physical Exam: Medial arch and medial ankle with only minimal clear drainage today.  Still a mild amount of drainage on the distal incision site.  The surrounding erythema continues to improve.  With flush the drainage becomes completely clear.  No signs of lymphangitic streaking.  White blood cell count continues to remain in the normal range at 8.7.  Most recent intraoperative culture still remains negative.  Previous culture showed Enterobacter and Enterococcus. Laboratory CBC    Component Value Date/Time   WBC 8.7 08/27/2022 0511   HGB 9.0 (L) 08/27/2022 0511   HCT 28.4 (L) 08/27/2022 0511   PLT 430 (H) 08/27/2022 0511    BMET    Component Value Date/Time   NA 136 08/27/2022 0511   K 3.9 08/27/2022 0511   CL 103 08/27/2022 0511   CO2 29 08/27/2022 0511   GLUCOSE 107 (H) 08/27/2022 0511   GLUCOSE 402 (H) 09/17/2012 1417   BUN 21 08/27/2022 0511   CREATININE 1.21 08/27/2022 0511   CALCIUM 8.3 (L) 08/27/2022 0511   GFRNONAA >60 08/27/2022 0511   GFRAA 58 (L) 06/02/2015 0656    Assessment/Planning: Status post transmetatarsal amputation and I&D for gas producing infection.  Diabetes with neuropathy  At this point recommend discharge once medically stable.  Continue with IV antibiotics.  Dressing changes have been placed in the discharge orders.  Every other day dressing changes should be performed. Continue with strict nonweightbearing to the left foot. Follow-up with me in 2 weeks.  We discussed there is still a risk of continued infection and  need for further surgical intervention but at this point there is only minimal drainage and his white blood cell count is normalized.  I feel comfortable with discharge.  [name] A  08/27/2022, 12:17 PM

## 2022-08-28 DIAGNOSIS — A419 Sepsis, unspecified organism: Secondary | ICD-10-CM | POA: Diagnosis not present

## 2022-08-28 LAB — GLUCOSE, CAPILLARY
Glucose-Capillary: 171 mg/dL — ABNORMAL HIGH (ref 70–99)
Glucose-Capillary: 181 mg/dL — ABNORMAL HIGH (ref 70–99)
Glucose-Capillary: 267 mg/dL — ABNORMAL HIGH (ref 70–99)
Glucose-Capillary: 242 mg/dL — ABNORMAL HIGH (ref 70–99)

## 2022-08-28 LAB — COMPREHENSIVE METABOLIC PANEL
ALT: 64 U/L — ABNORMAL HIGH (ref 0–44)
AST: 71 U/L — ABNORMAL HIGH (ref 15–41)
Albumin: 2.5 g/dL — ABNORMAL LOW (ref 3.5–5.0)
Alkaline Phosphatase: 131 U/L — ABNORMAL HIGH (ref 38–126)
Anion gap: 10 (ref 5–15)
BUN: 23 mg/dL (ref 8–23)
CO2: 27 mmol/L (ref 22–32)
Calcium: 8.5 mg/dL — ABNORMAL LOW (ref 8.9–10.3)
Chloride: 100 mmol/L (ref 98–111)
Creatinine, Ser: 1.14 mg/dL (ref 0.61–1.24)
GFR, Estimated: >60 mL/min (ref >60)
Glucose, Bld: 154 mg/dL — ABNORMAL HIGH (ref 70–99)
Potassium: 4.1 mmol/L (ref 3.5–5.1)
Sodium: 137 mmol/L (ref 135–145)
Total Bilirubin: 0.4 mg/dL (ref 0.3–1.2)
Total Protein: 6.9 g/dL (ref 6.5–8.1)

## 2022-08-28 LAB — CBC
HCT: 29.1 % — ABNORMAL LOW (ref 39.0–52.0)
Hemoglobin: 9.2 g/dL — ABNORMAL LOW (ref 13.0–17.0)
MCH: 29.7 pg (ref 26.0–34.0)
MCHC: 31.6 g/dL (ref 30.0–36.0)
MCV: 93.9 fL (ref 80.0–100.0)
Platelets: 409 10*3/uL — ABNORMAL HIGH (ref 150–400)
RBC: 3.1 MIL/uL — ABNORMAL LOW (ref 4.22–5.81)
RDW: 13.2 % (ref 11.5–15.5)
WBC: 8.5 10*3/uL (ref 4.0–10.5)
nRBC: 0 % (ref 0.0–0.2)

## 2022-08-28 LAB — MAGNESIUM: Magnesium: 2.2 mg/dL (ref 1.7–2.4)

## 2022-08-28 NOTE — Progress Notes (Signed)
PHARMACY CONSULT NOTE FOR:  OUTPATIENT  PARENTERAL ANTIBIOTIC THERAPY (OPAT)  Indication: DFU/Osteomyelitis  Regimen: Meropenem 1 gram IV every 8 hours  End date: 09/19/22  Labs - Once weekly:  CBC/D, CMP, ESR and CRP Please leave PICC in place until doctor has seen patient or been notified Fax weekly labs to [phone]  IV antibiotic discharge orders are pended. To discharging provider:  please sign these orders via discharge navigator,  Select New Orders & click on the button choice - Manage This Unsigned Work.     Thank you for allowing pharmacy to be a part of this patient's care.  Barrie Folk, PharmD 08/28/2022, 9:05 AM

## 2022-08-28 NOTE — TOC Progression Note (Signed)
Transition of Care [facility]) - Progression Note    Patient Details  Name: Andrew Harding MRN: [id number] Date of Birth: December 13, 1950  Transition of Care [facility] CM/SW Contact  Chapman Fitch, RN Phone Number: 08/28/2022, 11:59 AM  Clinical Narrative:     Patient expressed frustrations about waiting on insurance auth for CIR.  He is aware that Berkley Harvey is still pending.  Offered to provide patient with contact information to [place] so he could follow up with them directly.  He declines        Expected Discharge Plan and Services                                               Social Determinants of Health (SDOH) Interventions SDOH Screenings   Food Insecurity: No Food Insecurity (08/19/2022)  Housing: Low Risk  (08/19/2022)  Transportation Needs: No Transportation Needs (08/19/2022)  Utilities: Not At Risk (08/19/2022)  Tobacco Use: High Risk (08/24/2022)    Readmission Risk Interventions     No data to display

## 2022-08-28 NOTE — Progress Notes (Signed)
PROGRESS NOTE    Andrew Harding  ZOX:[id number] DOB: [birth year] DOA: 08/17/2022 PCP: Lynnea Ferrier, MD   Brief Narrative:  72 year old with history of poorly controlled insulin-dependent DM 2, peripheral neuropathy, HLD, HTN, CAD status post CABG sent to the hospital from Dr. Irene Limbo office due to worsening left foot swelling and erythema.  Patient also had a syncopal episode.  Patient underwent left lower extremity angiogram by vascular on 4/23.  Successful recannulization was performed with improvement in blood flows.  Infectious disease is currently following, antibiotics per their recommendations.  Patient underwent I&D of multiple sites of left foot and left ankle by podiatry, cultures were sent. PICC line placed on 4/26. Now awaiting podiatry to clear the patient for discharge to rehab.    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  ID, vascular and podiatry following - Continue meropenem. 4-6 weeks of Abx per ID.  Last day of antibiotic 09/19/2022. Outpatient follow-up with infectious disease Dr. Rivka Safer on 09/14/2022 8:45 am -Underwent LLE angiogram by vascular 4/23, successful recannulization and improvement in blood flows -Status post I&D by podiatry on 4/24.  Podiatry following -Recs: Non weight bearing left foot. Change dressing every other day per Podiatry. Follow up with Podiatry in 2 weeks   PICC PLACED 08/24/25  Wound culture 4/19: MODERATE ENTEROBACTER  CLOACAE  MODERATE ENTEROCOCCUS FAECALIS     Atrial fibrillation, New, rate controlled H/o CAD,s/p CABG x1 in 2014 Seen by [facility] cardiology.  On Eliquis and Plavix. Discontinue aspirin Echo shows preserved EF  Transaminitis, improved - Hepatitis panel negative.  Repeat CMP with PCP outpatient  Peripheral arterial disease - Now on Eliquis and aspirin.  Eventually will need statin once LFTs improved - LDL 54   Uncontrolled type II diabetes with peripheral neuropathy and CKD stage IIIB -A1c 6.9.  Sliding scale and Accu-Cheks -Gabapentin - Semglee 50 units bedtime.  Sliding scale and Accu-Chek   Hypotension with syncopal episode in the setting of pre-renal azotemia in post-op period CT head, CTA chest unremarkable.  Echocardiogram shows preserved EF   CKD stage IIIB -Creatinine stable around 1.2   Hyperlipidemia -- continue statins   DVT prophylaxis: eLIQUIS Code Status: Full Code Family Communication:   Status is: Inpatient Awaiting CIR placement  Diet Orders (From admission, onward)     Start     Ordered   08/23/22 1648  Diet Carb Modified Fluid consistency: Thin; Room service appropriate? Yes  Diet effective now       Question Answer Comment  Diet-HS Snack? Nothing   Calorie Level Medium 1600-2000   Fluid consistency: Thin   Room service appropriate? Yes      08/23/22 1647            Subjective: No complaints doing okay.  Examination: Constitutional: Not in acute distress Respiratory: Clear to auscultation bilaterally Cardiovascular: Normal sinus rhythm, no rubs Abdomen: Nontender nondistended good bowel sounds Musculoskeletal: No edema noted Skin: Left dressing in place  Neurologic: CN 2-12 grossly intact.  And nonfocal Psychiatric: Normal judgment and insight. Alert and oriented x 3. Normal mood.   Objective: Vitals:   08/27/22 0818 08/27/22 1953 08/28/22 0608 08/28/22 0808  BP: 108/64 116/67 110/63 119/65  Pulse: 85 93 85 73  Resp: 16 18 18 16    Temp: 98.1 F (36.7 C) 98.2 F (36.8 C)  98.2 F (36.8 C)  TempSrc:  Oral  Oral  SpO2: 97% 97%  97%  Weight:      Height:        Intake/Output Summary (Last 24 hours) at 08/28/2022 1100 Last data filed at 08/28/2022 1039 Gross per 24 hour  Intake 600 ml  Output 400 ml  Net 200 ml   Filed Weights   08/17/22 0913  Weight: 113.4 kg    Scheduled Meds:  apixaban  5 mg Oral BID   Chlorhexidine Gluconate Cloth  6 each Topical Daily   cholecalciferol  2,000 Units Oral Daily   clopidogrel  75 mg Oral Q breakfast   docusate sodium  100 mg Oral BID   feeding supplement  237 mL Oral BID BM   gabapentin  300 mg Oral TID   insulin aspart  0-5 Units Subcutaneous QHS   insulin aspart  0-9 Units Subcutaneous TID WC   insulin glargine-yfgn  50 Units Subcutaneous QHS   metoprolol tartrate  25 mg Oral BID   sodium chloride flush  10-40 mL Intracatheter Q12H   sodium chloride flush  3 mL Intravenous Q12H   Continuous Infusions:  sodium chloride 10 mL/hr at 08/23/22 0643   sodium chloride     meropenem (MERREM) IV 1 g (08/28/22 0504)    Nutritional status     Body mass index is 31.25 kg/m.  Data Reviewed:   CBC: Recent Labs  Lab 08/24/22 0158 08/25/22 0451 08/26/22 0500 08/27/22 0511 08/28/22 0510  WBC 9.6 8.5 8.5 8.7 8.5  HGB 9.0* 9.0* 8.9* 9.0* 9.2*  HCT 28.0* 27.9* 27.6* 28.4* 29.1*  MCV 94.0 94.6 92.9 94.0 93.9  PLT 354 408* 417* 430* 409*   Basic Metabolic Panel: Recent Labs  Lab 08/24/22 1020 08/25/22 0451 08/26/22 0500 08/27/22 0511 08/28/22 0510  NA 136 138 138 136 137  K 3.6 3.7 3.4* 3.9 4.1  CL 106 104 104 103 100  CO2 21* 26 26 29 27   GLUCOSE 154* 121* 117* 107* 154*  BUN 14 15 18 21 23   CREATININE 1.18 1.19 1.18 1.21 1.14  CALCIUM 8.3* 8.3* 8.5* 8.3* 8.5*  MG  --   --   --  2.0 2.2   GFR: Estimated Creatinine Clearance: 78.4 mL/min (by C-G formula based on SCr of 1.14 mg/dL). Liver Function Tests: Recent Labs  Lab 08/24/22 1020  08/25/22 0451 08/26/22 0500 08/27/22 0511 08/28/22 0510  AST 49* 51* 68* 67* 71*  ALT 69* 63* 64* 64* 64*  ALKPHOS 172* 163* 157* 133* 131*  BILITOT 0.6 0.7 0.4 0.5 0.4  PROT 6.6 6.6 6.6 6.7 6.9  ALBUMIN 2.2* 2.2* 2.2* 2.3* 2.5*   No results for input(s): "LIPASE", "AMYLASE" in the last 168 hours. No results for input(s): "AMMONIA" in the last 168 hours. Coagulation Profile: No results for input(s): "INR", "PROTIME" in the last 168 hours.  Cardiac Enzymes: No results for input(s): "CKTOTAL", "CKMB", "CKMBINDEX", "TROPONINI" in the last 168 hours.  BNP (last 3 results) No results for input(s): "PROBNP" in the last 8760 hours. HbA1C: No results for input(s): "HGBA1C" in the last 72 hours.  CBG: Recent Labs  Lab 08/27/22 0828 08/27/22 1256 08/27/22 1716 08/27/22 2201 08/28/22 0810  GLUCAP 112* 208* 140* 230* 171*   Lipid Profile: No results for input(s): "CHOL", "HDL", "LDLCALC", "TRIG", "CHOLHDL", "LDLDIRECT" in the last 72 hours.  Thyroid Function Tests: No results for input(s): "TSH", "T4TOTAL", "FREET4", "T3FREE", "THYROIDAB" in the last 72 hours. Anemia Panel: No results for input(s): "VITAMINB12", "FOLATE", "FERRITIN", "TIBC", "IRON", "RETICCTPCT" in the last 72 hours. Sepsis Labs: No results for input(s): "PROCALCITON", "LATICACIDVEN" in the last 168 hours.   Recent Results (from the past 240 hour(s))  Aerobic/Anaerobic Culture w Gram Stain (surgical/deep wound)     Status: None   Collection Time: 08/23/22  2:43 PM   Specimen: Path fluid; Body Fluid  Result Value Ref Range Status   Specimen Description WOUND LEFT FOOT  Final   Special Requests DRAIN  Final   Gram Stain NO WBC SEEN NO ORGANISMS SEEN   Final   Culture   Final    No growth aerobically or anaerobically. Performed at [facility], [address], [place], [place] [address]    Report Status 08/27/2022 FINAL  Final         Radiology Studies: No results found.         LOS:  11 days   Time spent= 35 mins    Ashwika Freels Joline Maxcy, MD [facility]  If 7PM-7AM, please contact night-coverage  08/28/2022, 11:00 AM

## 2022-08-28 NOTE — Progress Notes (Signed)
Physical Therapy Treatment Patient Details Name: Andrew Harding MRN: [id number] DOB: 02-02-1951 Today's Date: 08/28/2022   History of Present Illness Andrew Harding is a 72 year old male with recent toe amputations on left foot. Found to have worsening infection with gangrene and osteomyelitis. 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 History of diabetes mellitus.    PT Comments    Pt received supine in bed agreeable to PT services. Reports frustration due to awaiting insurance auth for AIR. Provided therapeutic listening. Pt making good progress towards mobility goals remaining supervision for bed mobility and minguard for STS and Hop-to gait pattern. Pt maintaining NWB throughout OOB mobility but remains with poor safety awareness and impulsivity needing frequent VC's for turns and hand placement with STS and stand <> sit with poor eccentric control with descent. Pt relies on seated rest b/t 2x15' hop-to bouts. Max HR after first bout 108 BPM and 140 BPM after second bout. Pt's safety awareness and balance greatly diminishes as pt fatigues leading to max VC's for safe sequencing of gait and RW use. Pt returning to supine in bed with all needs in reach and HR returning to low 80's upon exit. D/c recs remain appropriate at this time.    Recommendations for follow up therapy are one component of a multi-disciplinary discharge planning process, led by the attending physician.  Recommendations may be updated based on patient status, additional functional criteria and insurance authorization.      Assistance Recommended at Discharge Set up Supervision/Assistance  Patient can return home with the following A little help with walking and/or transfers;A little help with bathing/dressing/bathroom;Assistance with cooking/housework;Assist for transportation;Help with stairs or ramp for entrance   Equipment Recommendations  Other (comment) (TBD by next venue of care)    Recommendations for Other Services        Precautions / Restrictions Precautions Precautions: Fall Restrictions Weight Bearing Restrictions: Yes LLE Weight Bearing: Non weight bearing Other Position/Activity Restrictions: post-op shoe     Mobility  Bed Mobility Overal bed mobility: Needs Assistance Bed Mobility: Supine to Sit, Sit to Supine     Supine to sit: Supervision Sit to supine: Supervision     Patient Response: Cooperative  Transfers Overall transfer level: Needs assistance Equipment used: Rolling walker (2 wheels) Transfers: Sit to/from Stand Sit to Stand: Min guard           General transfer comment: max VC's for safe hand placement. MinA needed on RW to prevent posterior tipping due to poor carryoverwith hand placement.    Ambulation/Gait Ambulation/Gait assistance: Min guard Gait Distance (Feet): 30 Feet (2x15', seated rest b/t) Assistive device: Rolling walker (2 wheels) Gait Pattern/deviations: Step-to pattern (hop to)       General Gait Details: x2 Hop to to door and back. Vc's for safe sequencing with turnign RW. Maintains NWB throughout OOB mobility.   Stairs             Wheelchair Mobility    Modified Rankin (Stroke Patients Only)       Balance Overall balance assessment: Needs assistance Sitting-balance support: Feet supported Sitting balance-Leahy Scale: Good     Standing balance support: Bilateral upper extremity supported, During functional activity, Reliant on assistive device for balance Standing balance-Leahy Scale: Fair                              Cognition Arousal/Alertness: Awake/alert Behavior During Therapy: WFL for tasks assessed/performed Overall Cognitive Status:  Within Functional Limits for tasks assessed                                 General Comments: Expressing frustration awaiting insurance auth for AIR. Highly motivated.        Exercises      General Comments        Pertinent Vitals/Pain Pain  Assessment Pain Assessment: Faces Faces Pain Scale: No hurt    Home Living                          Prior Function            PT Goals (current goals can now be found in the care plan section) Acute Rehab PT Goals Patient Stated Goal: rehab then home PT Goal Formulation: With patient Time For Goal Achievement: 09/03/22 Potential to Achieve Goals: Fair Progress towards PT goals: Progressing toward goals    Frequency    Min 4X/week      PT Plan Current plan remains appropriate    Co-evaluation              AM-PAC PT "6 Clicks" Mobility   Outcome Measure  Help needed turning from your back to your side while in a flat bed without using bedrails?: None Help needed moving from lying on your back to sitting on the side of a flat bed without using bedrails?: None Help needed moving to and from a bed to a chair (including a wheelchair)?: A Little Help needed standing up from a chair using your arms (e.g., wheelchair or bedside chair)?: A Little Help needed to walk in hospital room?: A Lot Help needed climbing 3-5 steps with a railing? : A Lot 6 Click Score: 18    End of Session Equipment Utilized During Treatment: Gait belt Activity Tolerance: Patient tolerated treatment well Patient left: in bed;with call bell/phone within reach;with bed alarm set Nurse Communication: Mobility status PT Visit Diagnosis: Muscle weakness (generalized) (M62.81);Difficulty in walking, not elsewhere classified (R26.2)     Time: 6962-9528 PT Time Calculation (min) (ACUTE ONLY): 21 min  Charges:  $Gait Training: 8-22 mins                     Andrew Harding. Fairly IV, PT, DPT Physical Therapist- [place]  [facility]  08/28/2022, 3:58 PM

## 2022-08-28 NOTE — Progress Notes (Signed)
Inpatient Rehab Admissions Coordinator:   I continue to await insurance auth. I called [employer] and case is still pended for review. I will continue to follow for potential admit pending insurance auth.   Megan Salon, MS, CCC-SLP Rehab Admissions Coordinator  [phone] (celll) [phone] (office)
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
## 2022-08-29 ENCOUNTER — Other Ambulatory Visit: Payer: Self-pay

## 2022-08-29 ENCOUNTER — Inpatient Hospital Stay (HOSPITAL_COMMUNITY)
Admission: RE | Admit: 2022-08-29 | Discharge: 2022-09-05 | DRG: 560 | Disposition: A | Discharge status: Home Health | Payer: Managed Care, Other (non HMO) | Source: Other Acute Inpatient Hospital | Attending: Physical Medicine & Rehabilitation | Admitting: Physical Medicine & Rehabilitation

## 2022-08-29 ENCOUNTER — Encounter (HOSPITAL_COMMUNITY): Payer: Self-pay | Admitting: Physical Medicine & Rehabilitation

## 2022-08-29 DIAGNOSIS — I129 Hypertensive chronic kidney disease with stage 1 through stage 4 chronic kidney disease, or unspecified chronic kidney disease: Secondary | ICD-10-CM | POA: Diagnosis present

## 2022-08-29 DIAGNOSIS — Z833 Family history of diabetes mellitus: Secondary | ICD-10-CM

## 2022-08-29 DIAGNOSIS — Z7982 Long term (current) use of aspirin: Secondary | ICD-10-CM

## 2022-08-29 DIAGNOSIS — E1151 Type 2 diabetes mellitus with diabetic peripheral angiopathy without gangrene: Secondary | ICD-10-CM | POA: Diagnosis present

## 2022-08-29 DIAGNOSIS — T8189XS Other complications of procedures, not elsewhere classified, sequela: Secondary | ICD-10-CM | POA: Diagnosis not present

## 2022-08-29 DIAGNOSIS — N189 Chronic kidney disease, unspecified: Secondary | ICD-10-CM | POA: Diagnosis present

## 2022-08-29 DIAGNOSIS — I480 Paroxysmal atrial fibrillation: Secondary | ICD-10-CM | POA: Diagnosis present

## 2022-08-29 DIAGNOSIS — Z7902 Long term (current) use of antithrombotics/antiplatelets: Secondary | ICD-10-CM

## 2022-08-29 DIAGNOSIS — Z89422 Acquired absence of other left toe(s): Secondary | ICD-10-CM

## 2022-08-29 DIAGNOSIS — N179 Acute kidney failure, unspecified: Secondary | ICD-10-CM

## 2022-08-29 DIAGNOSIS — I4891 Unspecified atrial fibrillation: Secondary | ICD-10-CM | POA: Diagnosis present

## 2022-08-29 DIAGNOSIS — G7281 Critical illness myopathy: Secondary | ICD-10-CM | POA: Diagnosis not present

## 2022-08-29 DIAGNOSIS — Z4781 Encounter for orthopedic aftercare following surgical amputation: Principal | ICD-10-CM

## 2022-08-29 DIAGNOSIS — Z8249 Family history of ischemic heart disease and other diseases of the circulatory system: Secondary | ICD-10-CM | POA: Diagnosis not present

## 2022-08-29 DIAGNOSIS — M86172 Other acute osteomyelitis, left ankle and foot: Principal | ICD-10-CM

## 2022-08-29 DIAGNOSIS — Z89432 Acquired absence of left foot: Secondary | ICD-10-CM

## 2022-08-29 DIAGNOSIS — Z7985 Long-term (current) use of injectable non-insulin antidiabetic drugs: Secondary | ICD-10-CM

## 2022-08-29 DIAGNOSIS — R6521 Severe sepsis with septic shock: Secondary | ICD-10-CM

## 2022-08-29 DIAGNOSIS — L02416 Cutaneous abscess of left lower limb: Secondary | ICD-10-CM | POA: Diagnosis present

## 2022-08-29 DIAGNOSIS — F17290 Nicotine dependence, other tobacco product, uncomplicated: Secondary | ICD-10-CM | POA: Diagnosis present

## 2022-08-29 DIAGNOSIS — E1169 Type 2 diabetes mellitus with other specified complication: Secondary | ICD-10-CM | POA: Diagnosis not present

## 2022-08-29 DIAGNOSIS — A400 Sepsis due to streptococcus, group A: Secondary | ICD-10-CM

## 2022-08-29 DIAGNOSIS — E7800 Pure hypercholesterolemia, unspecified: Secondary | ICD-10-CM | POA: Diagnosis present

## 2022-08-29 DIAGNOSIS — M8618 Other acute osteomyelitis, other site: Secondary | ICD-10-CM | POA: Diagnosis not present

## 2022-08-29 DIAGNOSIS — Z79899 Other long term (current) drug therapy: Secondary | ICD-10-CM

## 2022-08-29 DIAGNOSIS — R7989 Other specified abnormal findings of blood chemistry: Secondary | ICD-10-CM | POA: Diagnosis not present

## 2022-08-29 DIAGNOSIS — T148XXA Other injury of unspecified body region, initial encounter: Secondary | ICD-10-CM

## 2022-08-29 DIAGNOSIS — Z89421 Acquired absence of other right toe(s): Secondary | ICD-10-CM | POA: Diagnosis not present

## 2022-08-29 DIAGNOSIS — I2510 Atherosclerotic heart disease of native coronary artery without angina pectoris: Secondary | ICD-10-CM | POA: Diagnosis present

## 2022-08-29 DIAGNOSIS — L089 Local infection of the skin and subcutaneous tissue, unspecified: Secondary | ICD-10-CM | POA: Diagnosis present

## 2022-08-29 DIAGNOSIS — E1142 Type 2 diabetes mellitus with diabetic polyneuropathy: Secondary | ICD-10-CM | POA: Diagnosis present

## 2022-08-29 DIAGNOSIS — T8781 Dehiscence of amputation stump: Secondary | ICD-10-CM | POA: Diagnosis not present

## 2022-08-29 DIAGNOSIS — Z794 Long term (current) use of insulin: Secondary | ICD-10-CM

## 2022-08-29 DIAGNOSIS — D649 Anemia, unspecified: Secondary | ICD-10-CM | POA: Diagnosis present

## 2022-08-29 DIAGNOSIS — L03116 Cellulitis of left lower limb: Secondary | ICD-10-CM | POA: Diagnosis present

## 2022-08-29 DIAGNOSIS — I10 Essential (primary) hypertension: Secondary | ICD-10-CM | POA: Diagnosis not present

## 2022-08-29 DIAGNOSIS — E559 Vitamin D deficiency, unspecified: Secondary | ICD-10-CM | POA: Diagnosis present

## 2022-08-29 DIAGNOSIS — E1122 Type 2 diabetes mellitus with diabetic chronic kidney disease: Secondary | ICD-10-CM

## 2022-08-29 DIAGNOSIS — Z7984 Long term (current) use of oral hypoglycemic drugs: Secondary | ICD-10-CM

## 2022-08-29 DIAGNOSIS — R5381 Other malaise: Secondary | ICD-10-CM | POA: Diagnosis present

## 2022-08-29 DIAGNOSIS — Z951 Presence of aortocoronary bypass graft: Secondary | ICD-10-CM

## 2022-08-29 DIAGNOSIS — E11649 Type 2 diabetes mellitus with hypoglycemia without coma: Secondary | ICD-10-CM | POA: Diagnosis not present

## 2022-08-29 DIAGNOSIS — N1832 Chronic kidney disease, stage 3b: Secondary | ICD-10-CM | POA: Diagnosis present

## 2022-08-29 DIAGNOSIS — N183 Chronic kidney disease, stage 3 unspecified: Secondary | ICD-10-CM | POA: Diagnosis present

## 2022-08-29 DIAGNOSIS — M869 Osteomyelitis, unspecified: Secondary | ICD-10-CM | POA: Insufficient documentation

## 2022-08-29 DIAGNOSIS — E119 Type 2 diabetes mellitus without complications: Secondary | ICD-10-CM | POA: Diagnosis not present

## 2022-08-29 DIAGNOSIS — Z7901 Long term (current) use of anticoagulants: Secondary | ICD-10-CM

## 2022-08-29 DIAGNOSIS — N1831 Chronic kidney disease, stage 3a: Secondary | ICD-10-CM | POA: Diagnosis present

## 2022-08-29 DIAGNOSIS — I7090 Unspecified atherosclerosis: Secondary | ICD-10-CM | POA: Diagnosis not present

## 2022-08-29 DIAGNOSIS — M109 Gout, unspecified: Secondary | ICD-10-CM | POA: Diagnosis present

## 2022-08-29 HISTORY — DX: Acquired absence of left foot: Z89.432

## 2022-08-29 LAB — CBC
HCT: 30.2 % — ABNORMAL LOW (ref 39.0–52.0)
Hemoglobin: 9.5 g/dL — ABNORMAL LOW (ref 13.0–17.0)
MCH: 29.8 pg (ref 26.0–34.0)
MCHC: 31.5 g/dL (ref 30.0–36.0)
MCV: 94.7 fL (ref 80.0–100.0)
Platelets: 405 10*3/uL — ABNORMAL HIGH (ref 150–400)
RBC: 3.19 MIL/uL — ABNORMAL LOW (ref 4.22–5.81)
RDW: 13 % (ref 11.5–15.5)
WBC: 8.5 10*3/uL (ref 4.0–10.5)
nRBC: 0 % (ref 0.0–0.2)

## 2022-08-29 LAB — COMPREHENSIVE METABOLIC PANEL
ALT: 64 U/L — ABNORMAL HIGH (ref 0–44)
AST: 70 U/L — ABNORMAL HIGH (ref 15–41)
Albumin: 2.5 g/dL — ABNORMAL LOW (ref 3.5–5.0)
Alkaline Phosphatase: 125 U/L (ref 38–126)
Anion gap: 10 (ref 5–15)
BUN: 24 mg/dL — ABNORMAL HIGH (ref 8–23)
CO2: 25 mmol/L (ref 22–32)
Calcium: 8.6 mg/dL — ABNORMAL LOW (ref 8.9–10.3)
Chloride: 103 mmol/L (ref 98–111)
Creatinine, Ser: 1.13 mg/dL (ref 0.61–1.24)
GFR, Estimated: >60 mL/min (ref >60)
Glucose, Bld: 145 mg/dL — ABNORMAL HIGH (ref 70–99)
Potassium: 4.2 mmol/L (ref 3.5–5.1)
Sodium: 138 mmol/L (ref 135–145)
Total Bilirubin: 0.6 mg/dL (ref 0.3–1.2)
Total Protein: 7.1 g/dL (ref 6.5–8.1)

## 2022-08-29 LAB — GLUCOSE, CAPILLARY
Glucose-Capillary: 159 mg/dL — ABNORMAL HIGH (ref 70–99)
Glucose-Capillary: 134 mg/dL — ABNORMAL HIGH (ref 70–99)
Glucose-Capillary: 87 mg/dL (ref 70–99)
Glucose-Capillary: 209 mg/dL — ABNORMAL HIGH (ref 70–99)

## 2022-08-29 LAB — MAGNESIUM: Magnesium: 2 mg/dL (ref 1.7–2.4)

## 2022-08-29 MED ORDER — CHLORHEXIDINE GLUCONATE CLOTH 2 % EX PADS
6.0000 | MEDICATED_PAD | Freq: Every day | CUTANEOUS | Status: DC
Start: 2022-08-30 — End: 2022-08-29

## 2022-08-29 MED ORDER — METOPROLOL TARTRATE 25 MG PO TABS
25.0000 mg | ORAL_TABLET | Freq: Two times a day (BID) | ORAL | Status: DC
  Administered 2022-08-29 – 2022-09-05 (×14): 25 mg via ORAL
  Filled 2022-08-29 (×14): qty 1

## 2022-08-29 MED ORDER — ZINC SULFATE 220 (50 ZN) MG PO CAPS
220.0000 mg | ORAL_CAPSULE | Freq: Every day | ORAL | Status: DC
  Administered 2022-08-29 – 2022-09-04 (×7): 220 mg via ORAL
  Filled 2022-08-29 (×7): qty 1

## 2022-08-29 MED ORDER — OXYCODONE HCL 5 MG PO TABS: 5.0000 mg | ORAL_TABLET | ORAL | 0 refills | Status: DC | PRN

## 2022-08-29 MED ORDER — ACETAMINOPHEN 325 MG PO TABS: 325.0000 mg | ORAL_TABLET | ORAL | Status: DC | PRN

## 2022-08-29 MED ORDER — DOCUSATE SODIUM 100 MG PO CAPS
100.0000 mg | ORAL_CAPSULE | Freq: Two times a day (BID) | ORAL | Status: DC
  Administered 2022-08-29 – 2022-09-04 (×13): 100 mg via ORAL
  Filled 2022-08-29 (×14): qty 1

## 2022-08-29 MED ORDER — APIXABAN 5 MG PO TABS
5.0000 mg | ORAL_TABLET | Freq: Two times a day (BID) | ORAL | Status: DC
  Administered 2022-08-29 – 2022-09-04 (×12): 5 mg via ORAL
  Filled 2022-08-29 (×12): qty 1

## 2022-08-29 MED ORDER — VITAMIN D 25 MCG (1000 UNIT) PO TABS
2000.0000 [IU] | ORAL_TABLET | Freq: Every day | ORAL | Status: DC
  Administered 2022-08-30 – 2022-09-05 (×7): 2000 [IU] via ORAL
  Filled 2022-08-29 (×7): qty 2

## 2022-08-29 MED ORDER — IPRATROPIUM-ALBUTEROL 0.5-2.5 (3) MG/3ML IN SOLN: 3.0000 mL | RESPIRATORY_TRACT | Status: DC | PRN

## 2022-08-29 MED ORDER — POLYETHYLENE GLYCOL 3350 17 G PO PACK: 17.0000 g | PACK | Freq: Every day | ORAL | Status: DC | PRN

## 2022-08-29 MED ORDER — INSULIN ASPART 100 UNIT/ML IJ SOLN
0.0000 [IU] | Freq: Three times a day (TID) | INTRAMUSCULAR | Status: DC
  Administered 2022-08-30: 5 [IU] via SUBCUTANEOUS
  Administered 2022-08-30: 1 [IU] via SUBCUTANEOUS
  Administered 2022-08-30: 3 [IU] via SUBCUTANEOUS
  Administered 2022-08-31: 2 [IU] via SUBCUTANEOUS
  Administered 2022-09-01: 1 [IU] via SUBCUTANEOUS
  Administered 2022-09-02 – 2022-09-05 (×7): 2 [IU] via SUBCUTANEOUS

## 2022-08-29 MED ORDER — CHLORHEXIDINE GLUCONATE CLOTH 2 % EX PADS
6.0000 | MEDICATED_PAD | Freq: Two times a day (BID) | CUTANEOUS | Status: DC
  Administered 2022-08-29 – 2022-09-05 (×14): 6 via TOPICAL

## 2022-08-29 MED ORDER — CLOPIDOGREL BISULFATE 75 MG PO TABS
75.0000 mg | ORAL_TABLET | Freq: Every day | ORAL | Status: DC
  Administered 2022-08-30 – 2022-09-05 (×7): 75 mg via ORAL
  Filled 2022-08-29 (×7): qty 1

## 2022-08-29 MED ORDER — APIXABAN 5 MG PO TABS: 5.0000 mg | ORAL_TABLET | Freq: Two times a day (BID) | ORAL | Status: DC

## 2022-08-29 MED ORDER — GABAPENTIN 300 MG PO CAPS
300.0000 mg | ORAL_CAPSULE | Freq: Three times a day (TID) | ORAL | Status: DC
  Administered 2022-08-29 – 2022-09-05 (×20): 300 mg via ORAL
  Filled 2022-08-29 (×20): qty 1

## 2022-08-29 MED ORDER — PROCHLORPERAZINE MALEATE 5 MG PO TABS
5.0000 mg | ORAL_TABLET | Freq: Four times a day (QID) | ORAL | Status: DC | PRN
  Administered 2022-08-30: 5 mg via ORAL
  Filled 2022-08-29: qty 1

## 2022-08-29 MED ORDER — CLOPIDOGREL BISULFATE 75 MG PO TABS: 75.0000 mg | ORAL_TABLET | Freq: Every day | ORAL | Status: DC

## 2022-08-29 MED ORDER — PROCHLORPERAZINE EDISYLATE 10 MG/2ML IJ SOLN: 5.0000 mg | Freq: Four times a day (QID) | INTRAMUSCULAR | Status: DC | PRN

## 2022-08-29 MED ORDER — BISACODYL 10 MG RE SUPP: 10.0000 mg | Freq: Every day | RECTAL | Status: DC | PRN

## 2022-08-29 MED ORDER — VITAMIN C 500 MG PO TABS
500.0000 mg | ORAL_TABLET | Freq: Two times a day (BID) | ORAL | Status: DC
  Administered 2022-08-29 – 2022-09-05 (×14): 500 mg via ORAL
  Filled 2022-08-29 (×14): qty 1

## 2022-08-29 MED ORDER — FLEET ENEMA 7-19 GM/118ML RE ENEM: 1.0000 | ENEMA | Freq: Once | RECTAL | Status: DC | PRN

## 2022-08-29 MED ORDER — TRAMADOL HCL 50 MG PO TABS: 50.0000 mg | ORAL_TABLET | Freq: Four times a day (QID) | ORAL | Status: DC | PRN

## 2022-08-29 MED ORDER — PROSOURCE PLUS PO LIQD
30.0000 mL | Freq: Two times a day (BID) | ORAL | Status: DC
  Administered 2022-08-29 – 2022-09-04 (×13): 30 mL via ORAL
  Filled 2022-08-29 (×13): qty 30

## 2022-08-29 MED ORDER — TRAZODONE HCL 50 MG PO TABS: 25.0000 mg | ORAL_TABLET | Freq: Every evening | ORAL | Status: DC | PRN

## 2022-08-29 MED ORDER — OXYCODONE HCL 5 MG PO TABS
5.0000 mg | ORAL_TABLET | ORAL | Status: DC | PRN
  Administered 2022-08-30: 5 mg via ORAL
  Filled 2022-08-29: qty 1

## 2022-08-29 MED ORDER — PROCHLORPERAZINE 25 MG RE SUPP: 12.5000 mg | Freq: Four times a day (QID) | RECTAL | Status: DC | PRN

## 2022-08-29 MED ORDER — SODIUM CHLORIDE 0.9 % IV SOLN
1.0000 g | Freq: Three times a day (TID) | INTRAVENOUS | Status: DC
  Administered 2022-08-29 – 2022-09-05 (×19): 1 g via INTRAVENOUS
  Filled 2022-08-29 (×22): qty 20

## 2022-08-29 MED ORDER — SODIUM CHLORIDE 0.9% FLUSH
10.0000 mL | INTRAVENOUS | Status: DC | PRN
  Administered 2022-08-30: 10 mL

## 2022-08-29 MED ORDER — DOCUSATE SODIUM 100 MG PO CAPS: 100.0000 mg | ORAL_CAPSULE | Freq: Two times a day (BID) | ORAL | 0 refills | Status: DC

## 2022-08-29 MED ORDER — MEROPENEM IV (FOR PTA / DISCHARGE USE ONLY): 1.0000 g | Freq: Three times a day (TID) | INTRAVENOUS | 0 refills | Status: DC

## 2022-08-29 MED ORDER — INSULIN ASPART 100 UNIT/ML IJ SOLN
0.0000 [IU] | Freq: Every day | INTRAMUSCULAR | Status: DC
  Administered 2022-08-29: 2 [IU] via SUBCUTANEOUS

## 2022-08-29 MED ORDER — METOPROLOL TARTRATE 25 MG PO TABS: 25.0000 mg | ORAL_TABLET | Freq: Two times a day (BID) | ORAL | 0 refills | Status: DC

## 2022-08-29 MED ORDER — DIPHENHYDRAMINE HCL 12.5 MG/5ML PO ELIX: 12.5000 mg | ORAL_SOLUTION | Freq: Four times a day (QID) | ORAL | Status: DC | PRN

## 2022-08-29 MED ORDER — ALUM & MAG HYDROXIDE-SIMETH 200-200-20 MG/5ML PO SUSP
30.0000 mL | ORAL | Status: DC | PRN
  Administered 2022-08-30: 30 mL via ORAL
  Filled 2022-08-29: qty 30

## 2022-08-29 MED ORDER — INSULIN GLARGINE-YFGN 100 UNIT/ML ~~LOC~~ SOLN
50.0000 [IU] | Freq: Every day | SUBCUTANEOUS | Status: DC
  Administered 2022-08-29 – 2022-08-31 (×3): 50 [IU] via SUBCUTANEOUS
  Filled 2022-08-29 (×4): qty 0.5

## 2022-08-29 MED ORDER — GUAIFENESIN-DM 100-10 MG/5ML PO SYRP: 5.0000 mL | ORAL_SOLUTION | Freq: Four times a day (QID) | ORAL | Status: DC | PRN

## 2022-08-29 MED ORDER — SODIUM CHLORIDE 0.9% FLUSH
10.0000 mL | Freq: Two times a day (BID) | INTRAVENOUS | Status: DC
  Administered 2022-08-29 – 2022-09-04 (×4): 10 mL

## 2022-08-29 MED ORDER — ENSURE MAX PROTEIN PO LIQD
11.0000 [oz_av] | Freq: Every day | ORAL | Status: DC
  Administered 2022-08-29 – 2022-09-04 (×7): 11 [oz_av] via ORAL

## 2022-08-29 MED ORDER — JUVEN PO PACK
1.0000 | PACK | Freq: Two times a day (BID) | ORAL | Status: DC
  Administered 2022-08-30 – 2022-09-05 (×12): 1 via ORAL
  Filled 2022-08-29 (×11): qty 1
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
## 2022-08-29 NOTE — Care Management Important Message (Signed)
Important Message  Patient Details  Name: Andrew Harding MRN: [id number] Date of Birth: 1950-05-22   Medicare Important Message Given:  Yes     [name] 08/29/2022, 11:20 AM

## 2022-08-29 NOTE — PMR Pre-admission (Signed)
PMR Admission Coordinator Pre-Admission Assessment  [name] is an 72 y.o., male MRN: [id number] DOB: 12-31-1950 Height: 6\' 3"  (190.5 cm) Weight: 113.4 kg  Insurance Information HMO: Yes    PPO:       PCP:       IPA:       80/20:       OTHER: Group [id number] PRIMARY:[employer] managed     Policy#:[id number]      Subscriber: pt CM Name: Wynetta Fines       Phone#: [phone] [id number]    Fax#: [phone] Pre-Cert#: [id number]      Employer:  Benefits:  Phone #:      Name:  Dolores Hoose Date: 05/02/2015 - 05/01/2023 Deductible: $2,500 ($2,500 met) OOP Max: $5,000 ($5,000 met) CIR: 70% coverage, 30% co-insurance SNF: 70% coverage, 30% co-insurance Outpatient: $75 copay/visit Home Health:  70% coverage, 30% co-insurance; limited to 30 visits/cal yr (30 remaining) DME: 70% coverage, 30% co-insurance Providers: in network   SECONDARY: [employer] [place]       Policy#: [id number]     Phone#:  auth obtained 4/26  Financial Counselor:       Phone#:   The "Data Collection Information Summary" for patients in Inpatient Rehabilitation Facilities with attached "Privacy Act Statement-Health Care Records" was provided and verbally reviewed with: Pt  Emergency Contact Information Contact Information     Name Relation Home Work Mobile   Luther [phone]         Current Medical History  Patient Admitting Diagnosis: Transmetatarsal Amputation  History of Present Illness: Andrew Harding is a 72 y.o. 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 Irene Limbo office after he went for wound check and noted upside was swollen and had erythema. Presented to the ED  [facility] on 08/21/22.When he came to the emergency room he was hypotensive mildly tachycardic. Received IV fluids per sepsis protocol and blood pressure is much better.  He was admitted with sepsis secondary to postop cellulitis of  left foot (Pt. Had left second, third, and fourth toes amputated one week prior). Received dose of IV vancomycin and IV Rocephin along with IV fluids. Vascular surgery was consulted to evaluate and assess blood flow to the patient's left foot for adequate healing. Pt underwent L transmetatarsal amputation with podiatry on 08/19/22. Pt. Underwent Underwent LLE angiogram by vascular 4/23, successful recannulization and improvement in blood flows   I&D performed 08/23/22 Pt seen by PT/OT and they recommend CIR to assist return to PLOF.     Patient's medical record from [facility] has been reviewed by the rehabilitation admission coordinator and physician.  Past Medical History  Past Medical History:  Diagnosis Date   Bladder neck obstruction    Chronic kidney disease    Coronary artery disease    a.) s/p 4v CABG in 2014   Diabetes mellitus without complication    Diabetic neuropathy    Diabetic peripheral neuropathy    Diverticulosis    Gout    Heart murmur    Hypercholesteremia    Hyperlipidemia    Hypertension    Peripheral neuropathy    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   Tubular adenoma    Vitamin D deficiency     Has the patient had major surgery during 100 days prior to admission? Yes  Family History   family history  includes CAD in his mother; Diabetes Mellitus II in his mother.  Current Medications  Current Facility-Administered Medications:    [MAR Hold] 0.9 %  sodium chloride infusion, , Intravenous, PRN, Schnier, Latina Craver, MD, Last Rate: 10 mL/hr at 08/23/22 0643, Restarted at 08/23/22 1423   [MAR Hold] 0.9 %  sodium chloride infusion, 250 mL, Intravenous, PRN, Schnier, Latina Craver, MD   [MAR Hold] acetaminophen (TYLENOL) tablet 650 mg, 650 mg, Oral, Q6H PRN, 650 mg at 08/20/22 0549 **OR** [MAR Hold] acetaminophen (TYLENOL) suppository 650 mg, 650 mg, Rectal, Q6H PRN, Schnier, Latina Craver, MD   [MAR Hold]  acetaminophen (TYLENOL) tablet 650 mg, 650 mg, Oral, Q4H PRN, Schnier, Latina Craver, MD   [MAR Hold] aspirin EC tablet 81 mg, 81 mg, Oral, Daily, Schnier, Latina Craver, MD, 81 mg at 08/22/22 1019   [MAR Hold] bisacodyl (DULCOLAX) suppository 10 mg, 10 mg, Rectal, Daily PRN, Schnier, Latina Craver, MD   bupivacaine (MARCAINE) 0.25 % (with pres) injection, , , PRN, [name], DPM   bupivacaine (MARCAINE) 0.5 % (with pres) injection, , , PRN, [name], DPM, 19 mL at 08/23/22 1456   [MAR Hold] cholecalciferol (VITAMIN D3) 25 MCG (1000 UNIT) tablet 2,000 Units, 2,000 Units, Oral, Daily, Schnier, Latina Craver, MD, 2,000 Units at 08/22/22 1019   [MAR Hold] clopidogrel (PLAVIX) tablet 75 mg, 75 mg, Oral, Q breakfast, Schnier, Latina Craver, MD   [MAR Hold] docusate sodium (COLACE) capsule 100 mg, 100 mg, Oral, BID, Schnier, Latina Craver, MD, 100 mg at 08/22/22 2150   [MAR Hold] feeding supplement (ENSURE ENLIVE / ENSURE PLUS) liquid 237 mL, 237 mL, Oral, BID BM, Schnier, Latina Craver, MD, 237 mL at 08/21/22 1315   [MAR Hold] fentaNYL (SUBLIMAZE) injection 12.5 mcg, 12.5 mcg, Intravenous, Once PRN, Schnier, Latina Craver, MD   [MAR Hold] gabapentin (NEURONTIN) capsule 300 mg, 300 mg, Oral, TID, Schnier, Latina Craver, MD, 300 mg at 08/22/22 2150   heparin ADULT infusion 100 units/mL (25000 units/266mL), 2,100 Units/hr, Intravenous, Continuous, Lowella Bandy, RPH   [facility] Hold] HYDROmorphone (DILAUDID) injection 1 mg, 1 mg, Intravenous, Once PRN, Schnier, Latina Craver, MD   [MAR Hold] insulin aspart (novoLOG) injection 0-5 Units, 0-5 Units, Subcutaneous, QHS, Schnier, Latina Craver, MD, 3 Units at 08/18/22 2209   [facility] Hold] insulin aspart (novoLOG) injection 0-9 Units, 0-9 Units, Subcutaneous, TID WC, Schnier, Latina Craver, MD, 1 Units at 08/21/22 1723   [MAR Hold] insulin glargine-yfgn (SEMGLEE) injection 50 Units, 50 Units, Subcutaneous, QHS, Schnier, Latina Craver, MD, 50 Units at 08/22/22 2150   [MAR Hold] meropenem (MERREM) 1 g in sodium  chloride 0.9 % 100 mL IVPB, 1 g, Intravenous, Q8H, Ravishankar, Jayashree, MD, Last Rate: 200 mL/hr at 08/23/22 1410, 1 g at 08/23/22 1410   [MAR Hold] metoprolol tartrate (LOPRESSOR) tablet 25 mg, 25 mg, Oral, BID, Tang, Lily Marcelino Duster, PA-C   [facility] Hold] morphine (PF) 4 MG/ML injection 2 mg, 2 mg, Intravenous, Q1H PRN, Schnier, Latina Craver, MD   [MAR Hold] ondansetron (ZOFRAN) tablet 4 mg, 4 mg, Oral, Q6H PRN **OR** [MAR Hold] ondansetron (ZOFRAN) injection 4 mg, 4 mg, Intravenous, Q6H PRN, Schnier, Latina Craver, MD   [MAR Hold] Oral care mouth rinse, 15 mL, Mouth Rinse, PRN, Schnier, Latina Craver, MD   [MAR Hold] oxyCODONE (Oxy IR/ROXICODONE) immediate release tablet 5-10 mg, 5-10 mg, Oral, Q4H PRN, Schnier, Latina Craver, MD, 10 mg at 08/22/22 2150   [facility] Hold] sodium chloride flush (NS) 0.9 % injection 3 mL, 3 mL, Intravenous,  Q12H, Schnier, Latina Craver, MD, 3 mL at 08/23/22 1010   [MAR Hold] sodium chloride flush (NS) 0.9 % injection 3 mL, 3 mL, Intravenous, PRN, Schnier, Latina Craver, MD   [MAR Hold] traZODone (DESYREL) tablet 25 mg, 25 mg, Oral, QHS PRN, Gilda Crease, Latina Craver, MD, 25 mg at 08/22/22 2150  Facility-Administered Medications Ordered in Other Encounters:    acetaminophen (OFIRMEV) IV, , Intravenous, Anesthesia Intra-op, Jeannene Patella, CRNA, 1,000 mg at 08/23/22 1454   fentaNYL (SUBLIMAZE) injection, , Intravenous, Anesthesia Intra-op, Jeannene Patella, CRNA, 25 mcg at 08/23/22 1443   lidocaine (cardiac) 100 mg/16mL (XYLOCAINE) injection 2%, , Intravenous, Anesthesia Intra-op, Jeannene Patella, CRNA, 50 mg at 08/23/22 1431   midazolam (VERSED) injection, , Intravenous, Anesthesia Intra-op, Jeannene Patella, CRNA, 0.5 mg at 08/23/22 1454   propofol (DIPRIVAN) 500 MG/50ML infusion, , Intravenous, Continuous PRN, Jeannene Patella, CRNA, Last Rate: 20.412 mL/hr at 08/23/22 1501, 30 mcg/kg/min at 08/23/22 1501  Patients Current Diet:  Diet Order             Diet NPO time specified  Diet effective now                    Precautions / Restrictions Precautions Precautions: Fall Restrictions Weight Bearing Restrictions: Yes LLE Weight Bearing: Non weight bearing Other Position/Activity Restrictions: post-op shoe   Has the patient had 2 or more falls or a fall with injury in the past year? Yes  Prior Activity Level Community (5-7x/wk): Pt. active in the community PTA  Prior Functional Level Self Care: Did the patient need help bathing, dressing, using the toilet or eating? Independent  Indoor Mobility: Did the patient need assistance with walking from room to room (with or without device)? Independent  Stairs: Did the patient need assistance with internal or external stairs (with or without device)? Independent  Functional Cognition: Did the patient need help planning regular tasks such as shopping or remembering to take medications? Independent  Patient Information Are you of Hispanic, Latino/a,or Spanish origin?: A. No, not of Hispanic, Latino/a, or Spanish origin What is your race?: A. White Do you need or want an interpreter to communicate with a doctor or health care staff?: 0. No  Patient's Response To:  Health Literacy and Transportation Is the patient able to respond to health literacy and transportation needs?: No Health Literacy - How often do you need to have someone help you when you read instructions, pamphlets, or other written material from your doctor or pharmacy?: Never In the past 12 months, has lack of transportation kept you from medical appointments or from getting medications?: No In the past 12 months, has lack of transportation kept you from meetings, work, or from getting things needed for daily living?: No  Home Assistive Devices / Equipment Home Assistive Devices/Equipment: Medical laboratory scientific officer (specify quad or straight), Wheelchair Home Equipment: Cane - quad, Agricultural consultant (2 wheels)  Prior Device Use: Indicate devices/aids used by the patient prior to current  illness, exacerbation or injury? Walker  Current Functional Level Cognition  Overall Cognitive Status: Within Functional Limits for tasks assessed Orientation Level: Oriented X4 General Comments: cooperative    Extremity Assessment (includes Sensation/Coordination)  Upper Extremity Assessment: Overall WFL for tasks assessed  Lower Extremity Assessment: LLE deficits/detail, RLE deficits/detail RLE Sensation: history of peripheral neuropathy LLE Deficits / Details: transmet amputation. patient is able to activate hip/knee/ankle movement LLE Sensation: history of peripheral neuropathy    ADLs  Overall ADL's : Needs assistance/impaired Grooming: Standing, Min guard, Cueing for  compensatory techniques, Cueing for safety Grooming Details (indicate cue type and reason): Simulated UB grooming at sink while standing with RW. Pt able to stand briefly without UE support, but fatigues quickly. Educated on compensatory ADL management strategies to maximize safety, functional independence, and adherence to NWB precautions. Functional mobility during ADLs: Min guard, Cueing for safety, Rolling walker (2 wheels) General ADL Comments: Min guard for functional mobiltiy with RW across short distance from EOB to sink (~4') Upon return to bed, x1 instance of posterior LOB appreciated with assist required for safe lowering onto seated surface and to maintain NWB. Continues to require close contact guard during functional mobility and multimodal cueing to maintain NWB status during functional mobiltiy.    Mobility  Overal bed mobility: Modified Independent Bed Mobility: Supine to Sit, Sit to Supine Supine to sit: HOB elevated, Supervision Sit to supine: Supervision, HOB elevated General bed mobility comments: Increased time/effort to perform.    Transfers  Overall transfer level: Needs assistance Equipment used: Rolling walker (2 wheels) Transfers: Sit to/from Stand Sit to Stand: Min guard, From elevated  surface Bed to/from chair/wheelchair/BSC transfer type:: Lateral/scoot transfer Step pivot transfers: Mod assist  Lateral/Scoot Transfers: Supervision, From elevated surface (can sccot entired length of bed each way with NWB, EOB elevated.) General transfer comment: verbal cues for technique, hand placement, LLE positioning to maintain NWB of LLE with transfers. encouraged proper use of rolling walker positioning and use of upper body support to maintain NWB of LLE with transfer. remains limited by fatigue    Ambulation / Gait / Stairs / Wheelchair Mobility  Ambulation/Gait Ambulation/Gait assistance: Land (Feet): 7 Feet Assistive device: Rolling walker (2 wheels) General Gait Details: fwd and backward hop-step at bedside;, maintained NWB but needs minGuard due to precarious balance. Gait velocity: decreased    Posture / Balance Balance Overall balance assessment: Needs assistance Sitting-balance support: Feet supported Sitting balance-Leahy Scale: Good Standing balance support: Bilateral upper extremity supported, During functional activity, Reliant on assistive device for balance Standing balance-Leahy Scale: Poor Standing balance comment: external support required. heavy reliance on rolling walker in order to maintain true NWB of LLE; briefly able to stand without UE support, but is very limited by fatigue.    Special needs/care consideration Skin Surgical incision, dressing changed every other day  and Special service needs NWB LLE   Previous Home Environment (from acute therapy documentation) Living Arrangements: Alone  Lives With: Alone Available Help at Discharge: Friend(s) Type of Home: House Home Layout: Able to live on main level with bedroom/bathroom Alternate Level Stairs-Number of Steps: bonus room (not required daily) with stairs to enter Home Access: Stairs to enter Entrance Stairs-Rails: None Entrance Stairs-Number of Steps: 4 Bathroom Shower/Tub:  Engineer, manufacturing systems: Handicapped height Bathroom Accessibility: Yes How Accessible: Accessible via walker Home Care Services: No Additional Comments: Male friend at bedside today; when asked what their relationship is, she states "it's complicated". It appears that she cares about him a lot, but is unable to be with him 24/7 due to health concerns of her own and responsibility for caring for others. She lives 20 minutes away; can stop by maybe 1x/daily. Did not explore this session about other friend relationships who may be able to help.  Discharge Living Setting Plans for Discharge Living Setting: Patient's home Type of Home at Discharge: House Discharge Home Layout: Multi-level, Able to live on main level with bedroom/bathroom Alternate Level Stairs-Rails: None Alternate Level Stairs-Number of Steps: flight Discharge Home Access:  Stairs to enter Entrance Stairs-Rails: None Entrance Stairs-Number of Steps: 4 Discharge Bathroom Shower/Tub: Tub/shower unit Discharge Bathroom Toilet: Handicapped height Discharge Bathroom Accessibility: No Does the patient have any problems obtaining your medications?: No  Social/Family/Support Systems Patient Roles: Spouse Contact Information: [phone] Anticipated Caregiver: Roney Jaffe Caregiver Availability: 24/7 Discharge Plan Discussed with Primary Caregiver: Yes Is Caregiver In Agreement with Plan?: No Does Caregiver/Family have Issues with Lodging/Transportation while Pt is in Rehab?: No  Goals Patient/Family Goal for Rehab: PT/OT Mod I Expected length of stay: 5-7 days Pt/Family Agrees to Admission and willing to participate: Yes Program Orientation Provided & Reviewed with Pt/Caregiver Including Roles  & Responsibilities: Yes  Decrease burden of Care through IP rehab admission: not anticipated   Possible need for SNF placement upon discharge: not anticipated    Preadmission Screen Completed By:  Jeronimo Greaves,  08/23/2022 3:03 PM ______________________________________________________________________   Discussed status with Dr. Berline Chough  on 08/29/22 at 930  and received approval for admission today.  Admission Coordinator:  Jeronimo Greaves, CCC-SLP, time 1058/Date 08/29/22    Patient Condition: This patient's medical and functional status has changed since the consult dated: 08/25/22 in which the Rehabilitation Physician determined and documented that the patient's condition is appropriate for intensive rehabilitative care in an inpatient rehabilitation facility. See "History of Present Illness" (above) for medical update. Functional changes are: Pt now min guard with ambulation and ADLS. Patient's medical and functional status update has been discussed with the Rehabilitation physician and patient remains appropriate for inpatient rehabilitation. Will admit to inpatient rehab today.   Preadmission Screen Completed By:  Jeronimo Greaves, 08/23/2022 3:03 PM ______________________________________________________________________   Discussed status with Dr. Berline Chough  on 08/29/22 at 930  and received approval for admission today.  Admission Coordinator:  Jeronimo Greaves, CCC-SLP, time 1058/Date 08/29/22

## 2022-08-29 NOTE — Progress Notes (Addendum)
PMR Admission Coordinator Pre-Admission Assessment   [name] is an 72 y.o., male MRN: [id number] DOB: 06-02-1950 Height: 6\' 3"  (190.5 cm) Weight: 113.4 kg   Insurance Information HMO: Yes    PPO:       PCP:       IPA:       80/20:       OTHER: Group [id number] PRIMARY:[employer] managed     Policy#:[id number]      Subscriber: pt CM Name: Wynetta Fines       Phone#: [phone] [id number]    Fax#: [phone] Pre-Cert#: [id number]      Employer: :  Pt. approved 4/30 for admit 4/30-5/6 with update due 5/6 Benefits:  Phone #:      Name:  Eff Date: 05/02/2015 - 05/01/2023 Deductible: $2,500 ($2,500 met) OOP Max: $5,000 ($5,000 met) CIR: 70% coverage, 30% co-insurance SNF: 70% coverage, 30% co-insurance Outpatient: $75 copay/visit Home Health:  70% coverage, 30% co-insurance; limited to 30 visits/cal yr (30 remaining) DME: 70% coverage, 30% co-insurance Providers: in network    SECONDARY: [employer] [place]       Policy#: [id number]     Phone#:  auth obtained 4/26-5.9/24 Reference number  [id number]  Approved via fax. Phone: [phone], fax: [phone]  Tertiary:  Medicare Part A . Policy # [id number]    The "Data Collection Information Summary" for patients in Inpatient Rehabilitation Facilities with attached "Privacy Act Statement-Health Care Records" was provided and verbally reviewed with: Pt   Emergency Contact Information Contact Information       Name Relation Home Work Mobile    Charlies Silvers [phone]               Current Medical History  Patient Admitting Diagnosis: Transmetatarsal Amputation  History of Present Illness: ISSAIH KAUS is a 72 y.o. 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 Irene Limbo office after he went for wound check and noted upside was swollen and had erythema. Presented to the ED   [facility] on 08/21/22.When he came to the emergency room he was hypotensive mildly tachycardic. Received IV fluids per sepsis protocol and blood pressure is much better. He was admitted with sepsis secondary to postop cellulitis of  left foot (Pt. Had left second, third, and fourth toes amputated one week prior). Received dose of IV vancomycin and IV Rocephin along with IV fluids. Vascular surgery was consulted to evaluate and assess blood flow to the patient's left foot for adequate healing. Pt underwent L transmetatarsal amputation with podiatry on 08/19/22. Pt. Underwent Underwent LLE angiogram by vascular 4/23, successful recannulization and improvement in blood flows   I&D performed 08/23/22 Pt seen by PT/OT and they recommend CIR to assist return to PLOF.      Patient's medical record from [facility] has been reviewed by the rehabilitation admission coordinator and physician.   Past Medical History      Past Medical History:  Diagnosis Date   Bladder neck obstruction     Chronic kidney disease     Coronary artery disease      a.) s/p 4v CABG in 2014   Diabetes mellitus without complication     Diabetic neuropathy     Diabetic peripheral neuropathy     Diverticulosis     Gout     Heart murmur     Hypercholesteremia  Hyperlipidemia     Hypertension     Peripheral neuropathy     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   Tubular adenoma     Vitamin D deficiency        Has the patient had major surgery during 100 days prior to admission? Yes   Family History   family history includes CAD in his mother; Diabetes Mellitus II in his mother.   Current Medications   Current Facility-Administered Medications:    [MAR Hold] 0.9 %  sodium chloride infusion, , Intravenous, PRN, Schnier, Latina Craver, MD, Last Rate: 10 mL/hr at 08/23/22 0643, Restarted at 08/23/22 1423   [MAR Hold] 0.9 %  sodium chloride infusion, 250 mL, Intravenous, PRN, Schnier, Latina Craver, MD    [MAR Hold] acetaminophen (TYLENOL) tablet 650 mg, 650 mg, Oral, Q6H PRN, 650 mg at 08/20/22 0549 **OR** [MAR Hold] acetaminophen (TYLENOL) suppository 650 mg, 650 mg, Rectal, Q6H PRN, Schnier, Latina Craver, MD   [MAR Hold] acetaminophen (TYLENOL) tablet 650 mg, 650 mg, Oral, Q4H PRN, Schnier, Latina Craver, MD   [MAR Hold] aspirin EC tablet 81 mg, 81 mg, Oral, Daily, Schnier, Latina Craver, MD, 81 mg at 08/22/22 1019   [MAR Hold] bisacodyl (DULCOLAX) suppository 10 mg, 10 mg, Rectal, Daily PRN, Schnier, Latina Craver, MD   bupivacaine (MARCAINE) 0.25 % (with pres) injection, , , PRN, [name], DPM   bupivacaine (MARCAINE) 0.5 % (with pres) injection, , , PRN, [name], DPM, 19 mL at 08/23/22 1456   [MAR Hold] cholecalciferol (VITAMIN D3) 25 MCG (1000 UNIT) tablet 2,000 Units, 2,000 Units, Oral, Daily, Schnier, Latina Craver, MD, 2,000 Units at 08/22/22 1019   [MAR Hold] clopidogrel (PLAVIX) tablet 75 mg, 75 mg, Oral, Q breakfast, Schnier, Latina Craver, MD   [MAR Hold] docusate sodium (COLACE) capsule 100 mg, 100 mg, Oral, BID, Schnier, Latina Craver, MD, 100 mg at 08/22/22 2150   [MAR Hold] feeding supplement (ENSURE ENLIVE / ENSURE PLUS) liquid 237 mL, 237 mL, Oral, BID BM, Schnier, Latina Craver, MD, 237 mL at 08/21/22 1315   [MAR Hold] fentaNYL (SUBLIMAZE) injection 12.5 mcg, 12.5 mcg, Intravenous, Once PRN, Schnier, Latina Craver, MD   [MAR Hold] gabapentin (NEURONTIN) capsule 300 mg, 300 mg, Oral, TID, Schnier, Latina Craver, MD, 300 mg at 08/22/22 2150   heparin ADULT infusion 100 units/mL (25000 units/278mL), 2,100 Units/hr, Intravenous, Continuous, Lowella Bandy, RPH   [facility] Hold] HYDROmorphone (DILAUDID) injection 1 mg, 1 mg, Intravenous, Once PRN, Schnier, Latina Craver, MD   [MAR Hold] insulin aspart (novoLOG) injection 0-5 Units, 0-5 Units, Subcutaneous, QHS, Schnier, Latina Craver, MD, 3 Units at 08/18/22 2209   [facility] Hold] insulin aspart (novoLOG) injection 0-9 Units, 0-9 Units, Subcutaneous, TID WC, Schnier, Latina Craver,  MD, 1 Units at 08/21/22 1723   [MAR Hold] insulin glargine-yfgn (SEMGLEE) injection 50 Units, 50 Units, Subcutaneous, QHS, Schnier, Latina Craver, MD, 50 Units at 08/22/22 2150   [MAR Hold] meropenem (MERREM) 1 g in sodium chloride 0.9 % 100 mL IVPB, 1 g, Intravenous, Q8H, Ravishankar, Jayashree, MD, Last Rate: 200 mL/hr at 08/23/22 1410, 1 g at 08/23/22 1410   [MAR Hold] metoprolol tartrate (LOPRESSOR) tablet 25 mg, 25 mg, Oral, BID, Tang, Lily Marcelino Duster, PA-C   [facility] Hold] morphine (PF) 4 MG/ML injection 2 mg, 2 mg, Intravenous, Q1H PRN, Schnier, Latina Craver, MD   [MAR Hold] ondansetron [facility]) tablet 4 mg, 4 mg, Oral, Q6H PRN **OR** [MAR Hold] ondansetron (ZOFRAN) injection 4 mg, 4 mg, Intravenous, Q6H PRN, Schnier,  Latina Craver, MD   [facility] Hold] Oral care mouth rinse, 15 mL, Mouth Rinse, PRN, Schnier, Latina Craver, MD   [MAR Hold] oxyCODONE (Oxy IR/ROXICODONE) immediate release tablet 5-10 mg, 5-10 mg, Oral, Q4H PRN, Schnier, Latina Craver, MD, 10 mg at 08/22/22 2150   [facility] Hold] sodium chloride flush (NS) 0.9 % injection 3 mL, 3 mL, Intravenous, Q12H, Schnier, Latina Craver, MD, 3 mL at 08/23/22 1010   [MAR Hold] sodium chloride flush (NS) 0.9 % injection 3 mL, 3 mL, Intravenous, PRN, Schnier, Latina Craver, MD   [MAR Hold] traZODone (DESYREL) tablet 25 mg, 25 mg, Oral, QHS PRN, Gilda Crease, Latina Craver, MD, 25 mg at 08/22/22 2150   Facility-Administered Medications Ordered in Other Encounters:    acetaminophen (OFIRMEV) IV, , Intravenous, Anesthesia Intra-op, Jeannene Patella, CRNA, 1,000 mg at 08/23/22 1454   fentaNYL (SUBLIMAZE) injection, , Intravenous, Anesthesia Intra-op, Jeannene Patella, CRNA, 25 mcg at 08/23/22 1443   lidocaine (cardiac) 100 mg/12mL (XYLOCAINE) injection 2%, , Intravenous, Anesthesia Intra-op, Jeannene Patella, CRNA, 50 mg at 08/23/22 1431   midazolam (VERSED) injection, , Intravenous, Anesthesia Intra-op, Jeannene Patella, CRNA, 0.5 mg at 08/23/22 1454   propofol (DIPRIVAN) 500 MG/50ML infusion, ,  Intravenous, Continuous PRN, Jeannene Patella, CRNA, Last Rate: 20.412 mL/hr at 08/23/22 1501, 30 mcg/kg/min at 08/23/22 1501   Patients Current Diet:  Diet Order                  Diet NPO time specified  Diet effective now                         Precautions / Restrictions Precautions Precautions: Fall Restrictions Weight Bearing Restrictions: Yes LLE Weight Bearing: Non weight bearing Other Position/Activity Restrictions: post-op shoe    Has the patient had 2 or more falls or a fall with injury in the past year? Yes   Prior Activity Level Community (5-7x/wk): Pt. active in the community PTA   Prior Functional Level Self Care: Did the patient need help bathing, dressing, using the toilet or eating? Independent   Indoor Mobility: Did the patient need assistance with walking from room to room (with or without device)? Independent   Stairs: Did the patient need assistance with internal or external stairs (with or without device)? Independent   Functional Cognition: Did the patient need help planning regular tasks such as shopping or remembering to take medications? Independent   Patient Information Are you of Hispanic, Latino/a,or Spanish origin?: A. No, not of Hispanic, Latino/a, or Spanish origin What is your race?: A. White Do you need or want an interpreter to communicate with a doctor or health care staff?: 0. No   Patient's Response To:  Health Literacy and Transportation Is the patient able to respond to health literacy and transportation needs?: No Health Literacy - How often do you need to have someone help you when you read instructions, pamphlets, or other written material from your doctor or pharmacy?: Never In the past 12 months, has lack of transportation kept you from medical appointments or from getting medications?: No In the past 12 months, has lack of transportation kept you from meetings, work, or from getting things needed for daily living?: No    Home Assistive Devices / Equipment Home Assistive Devices/Equipment: Medical laboratory scientific officer (specify quad or straight), Wheelchair Home Equipment: Cane - quad, Agricultural consultant (2 wheels)   Prior Device Use: Indicate devices/aids used by the patient prior to current illness, exacerbation or injury? Dan Humphreys  Current Functional Level Cognition   Overall Cognitive Status: Within Functional Limits for tasks assessed Orientation Level: Oriented X4 General Comments: cooperative    Extremity Assessment (includes Sensation/Coordination)   Upper Extremity Assessment: Overall WFL for tasks assessed  Lower Extremity Assessment: LLE deficits/detail, RLE deficits/detail RLE Sensation: history of peripheral neuropathy LLE Deficits / Details: transmet amputation. patient is able to activate hip/knee/ankle movement LLE Sensation: history of peripheral neuropathy     ADLs   Overall ADL's : Needs assistance/impaired Grooming: Standing, Min guard, Cueing for compensatory techniques, Cueing for safety Grooming Details (indicate cue type and reason): Simulated UB grooming at sink while standing with RW. Pt able to stand briefly without UE support, but fatigues quickly. Educated on compensatory ADL management strategies to maximize safety, functional independence, and adherence to NWB precautions. Functional mobility during ADLs: Min guard, Cueing for safety, Rolling walker (2 wheels) General ADL Comments: Min guard for functional mobiltiy with RW across short distance from EOB to sink (~4') Upon return to bed, x1 instance of posterior LOB appreciated with assist required for safe lowering onto seated surface and to maintain NWB. Continues to require close contact guard during functional mobility and multimodal cueing to maintain NWB status during functional mobiltiy.     Mobility   Overal bed mobility: Modified Independent Bed Mobility: Supine to Sit, Sit to Supine Supine to sit: HOB elevated, Supervision Sit to supine:  Supervision, HOB elevated General bed mobility comments: Increased time/effort to perform.     Transfers   Overall transfer level: Needs assistance Equipment used: Rolling walker (2 wheels) Transfers: Sit to/from Stand Sit to Stand: Min guard, From elevated surface Bed to/from chair/wheelchair/BSC transfer type:: Lateral/scoot transfer Step pivot transfers: Mod assist  Lateral/Scoot Transfers: Supervision, From elevated surface (can sccot entired length of bed each way with NWB, EOB elevated.) General transfer comment: verbal cues for technique, hand placement, LLE positioning to maintain NWB of LLE with transfers. encouraged proper use of rolling walker positioning and use of upper body support to maintain NWB of LLE with transfer. remains limited by fatigue     Ambulation / Gait / Stairs / Wheelchair Mobility   Ambulation/Gait Ambulation/Gait assistance: Land (Feet): 7 Feet Assistive device: Rolling walker (2 wheels) General Gait Details: fwd and backward hop-step at bedside;, maintained NWB but needs minGuard due to precarious balance. Gait velocity: decreased     Posture / Balance Balance Overall balance assessment: Needs assistance Sitting-balance support: Feet supported Sitting balance-Leahy Scale: Good Standing balance support: Bilateral upper extremity supported, During functional activity, Reliant on assistive device for balance Standing balance-Leahy Scale: Poor Standing balance comment: external support required. heavy reliance on rolling walker in order to maintain true NWB of LLE; briefly able to stand without UE support, but is very limited by fatigue.     Special needs/care consideration Skin Surgical incision, dressing changed every other day  and Special service needs NWB LLE    Previous Home Environment (from acute therapy documentation) Living Arrangements: Alone  Lives With: Alone Available Help at Discharge: Friend(s) Type of Home:  House Home Layout: Able to live on main level with bedroom/bathroom Alternate Level Stairs-Number of Steps: bonus room (not required daily) with stairs to enter Home Access: Stairs to enter Entrance Stairs-Rails: None Entrance Stairs-Number of Steps: 4 Bathroom Shower/Tub: Engineer, manufacturing systems: Handicapped height Bathroom Accessibility: Yes How Accessible: Accessible via walker Home Care Services: No Additional Comments: Male friend at bedside today; when asked what their relationship is, she states "  it's complicated". It appears that she cares about him a lot, but is unable to be with him 24/7 due to health concerns of her own and responsibility for caring for others. She lives 20 minutes away; can stop by maybe 1x/daily. Did not explore this session about other friend relationships who may be able to help.   Discharge Living Setting Plans for Discharge Living Setting: Patient's home Type of Home at Discharge: House Discharge Home Layout: Multi-level, Able to live on main level with bedroom/bathroom Alternate Level Stairs-Rails: None Alternate Level Stairs-Number of Steps: flight Discharge Home Access: Stairs to enter Entrance Stairs-Rails: None Entrance Stairs-Number of Steps: 4 Discharge Bathroom Shower/Tub: Tub/shower unit Discharge Bathroom Toilet: Handicapped height Discharge Bathroom Accessibility: No Does the patient have any problems obtaining your medications?: No   Social/Family/Support Systems Patient Roles: Spouse Contact Information: [phone] Anticipated Caregiver: Roney Jaffe Caregiver Availability: 24/7 Discharge Plan Discussed with Primary Caregiver: Yes Is Caregiver In Agreement with Plan?: No Does Caregiver/Family have Issues with Lodging/Transportation while Pt is in Rehab?: No   Goals Patient/Family Goal for Rehab: PT/OT Mod I Expected length of stay: 5-7 days Pt/Family Agrees to Admission and willing to participate: Yes Program  Orientation Provided & Reviewed with Pt/Caregiver Including Roles  & Responsibilities: Yes   Decrease burden of Care through IP rehab admission: not anticipated    Possible need for SNF placement upon discharge: not anticipated      Preadmission Screen Completed By:  Jeronimo Greaves, 08/23/2022 3:03 PM ______________________________________________________________________   Discussed status with Dr. Berline Chough  on 08/29/22 at 930  and received approval for admission today.   Admission Coordinator:  Jeronimo Greaves, CCC-SLP, time 1058/Date 08/29/22      Patient Condition: This patient's medical and functional status has changed since the consult dated: 08/25/22 in which the Rehabilitation Physician determined and documented that the patient's condition is appropriate for intensive rehabilitative care in an inpatient rehabilitation facility. See "History of Present Illness" (above) for medical update. Functional changes are: Pt now min guard with ambulation and ADLS. Patient's medical and functional status update has been discussed with the Rehabilitation physician and patient remains appropriate for inpatient rehabilitation. Will admit to inpatient rehab today.     Preadmission Screen Completed By:  Jeronimo Greaves, 08/23/2022 3:03 PM ______________________________________________________________________   Discussed status with Dr. Berline Chough  on 08/29/22 at 930  and received approval for admission today.   Admission Coordinator:  Jeronimo Greaves, CCC-SLP, time 1058/Date 08/29/22

## 2022-08-29 NOTE — TOC Transition Note (Signed)
Transition of Care [facility]) - CM/SW Discharge Note   Patient Details  Name: Andrew Harding MRN: [id number] Date of Birth: Dec 03, 1950  [facility] CM/SW Contact:  Chapman Fitch, RN Phone Number: 08/29/2022, 12:23 PM   Clinical Narrative:      Patient to discharge to [facility] Inpatient [facility]hab today Vernona Rieger with CIR to update patient and wife Vernona Rieger with CIR has arranged [facility] transport, and obtained bedside RN contact information for report to be called.  [facility] forms printed to unit        Patient Goals and CMS Choice      Discharge Placement                         Discharge Plan and Services Additional resources added to the After Visit Summary for                                       Social Determinants of Health (SDOH) Interventions SDOH Screenings   Food Insecurity: No Food Insecurity (08/19/2022)  Housing: Low Risk  (08/19/2022)  Transportation Needs: No Transportation Needs (08/19/2022)  Utilities: Not At Risk (08/19/2022)  Tobacco Use: High Risk (08/24/2022)     Readmission Risk Interventions     No data to display

## 2022-08-29 NOTE — Progress Notes (Signed)
Occupational Therapy Treatment Patient Details Name: Andrew Harding MRN: [id number] DOB: [birth year] Today's Date: 08/29/2022   History of present illness Andrew Harding is a 72 year old male with recent toe amputations on left foot. Found to have worsening infection with gangrene and osteomyelitis. 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 History of diabetes mellitus.   OT comments  Andrew Harding was seen for OT treatment on this date. Upon arrival to room, pt semi-supine in bed, agreeable to OT tx session. Pt eager to complete sponge bath at EOB and endorses satisfaction that he has finally gotten insurance approval to go to inpatient rehab. OT facilitated ADL management as described below. Pt demonstrates improved adherence to NWB precaution on his operative extremity. Post op-shoe donned t/o session. Pt able to perofrm STS for LB bathing with CGA and min cueing for NWB through LLE. He fatigues quickly with progression but is able to stand with RW for ~2 min with 1 UE support on RW to complete simulated bathing tasks with close min guard.  Pt progressing toward goals and continues to benefit from skilled OT services to maximize return to PLOF and minimize risk of future falls, injury, caregiver burden, and readmission. Will continue to follow POC. Continue to anticipate the need for high intensity rehab services upon acute hospital DC    Recommendations for follow up therapy are one component of a multi-disciplinary discharge planning process, led by the attending physician.  Recommendations may be updated based on patient status, additional functional criteria and insurance authorization.    Assistance Recommended at Discharge Intermittent Supervision/Assistance  Patient can return home with the following  A little help with walking and/or transfers;A little help with bathing/dressing/bathroom;Assistance with cooking/housework;Assist for transportation;Help with stairs or ramp for entrance   Equipment  Recommendations       Recommendations for Other Services      Precautions / Restrictions Precautions Precautions: Fall Restrictions Weight Bearing Restrictions: Yes LLE Weight Bearing: Non weight bearing Other Position/Activity Restrictions: post-op shoe       Mobility Bed Mobility Overal bed mobility: Needs Assistance Bed Mobility: Supine to Sit, Sit to Supine     Supine to sit: Supervision Sit to supine: Supervision        Transfers Overall transfer level: Needs assistance Equipment used: Rolling walker (2 wheels) Transfers: Sit to/from Stand Sit to Stand: Min guard, From elevated surface     Step pivot transfers: Min guard     General transfer comment: Continues to demonstrate poor eccentric control during stand>sit t/f.     Balance Overall balance assessment: Needs assistance Sitting-balance support: Feet supported, No upper extremity supported Sitting balance-Leahy Scale: Good     Standing balance support: Bilateral upper extremity supported, During functional activity, Reliant on assistive device for balance Standing balance-Leahy Scale: Fair                             ADL either performed or assessed with clinical judgement   ADL Overall ADL's : Needs assistance/impaired     Grooming: Sitting;Supervision/safety;Set up;Cueing for safety   Upper Body Bathing: Sitting;Supervision/ safety;Set up;Cueing for safety   Lower Body Bathing: Min guard;Sit to/from stand;Cueing for safety                       Functional mobility during ADLs: Min guard;Rolling walker (2 wheels) General ADL Comments: SUPERVISION for seated UB ADL management with cueing for safety and to  maintain NWB t/o functional activity. close MIN GUARD for STS LB bathing this session. Pt does better with standing on his non-operative extremity during LB ADL management this date.    Extremity/Trunk Assessment              Vision Baseline Vision/History: 1 Wears  glasses Patient Visual Report: No change from baseline Vision Assessment?: No apparent visual deficits   Perception     Praxis      Cognition Arousal/Alertness: Awake/alert Behavior During Therapy: WFL for tasks assessed/performed Overall Cognitive Status: Within Functional Limits for tasks assessed                                          Exercises Other Exercises Other Exercises: OT facilitated ADL management as described above with ongoing education re: safety, falls prevention, safe use of AE/DME for ADL management, and routines modifications to support safety and functional independence during ADL management.    Shoulder Instructions       General Comments      Pertinent Vitals/ Pain       Pain Assessment Pain Assessment: No/denies pain  Home Living                                          Prior Functioning/Environment              Frequency  Min 3X/week        Progress Toward Goals  OT Goals(current goals can now be found in the care plan section)  Progress towards OT goals: Progressing toward goals  Acute Rehab OT Goals OT Goal Formulation: With patient Time For Goal Achievement: 09/03/22 Potential to Achieve Goals: Good  Plan Discharge plan remains appropriate;Frequency remains appropriate    Co-evaluation                 AM-PAC OT "6 Clicks" Daily Activity     Outcome Measure   Help from another person eating meals?: None Help from another person taking care of personal grooming?: A Little Help from another person toileting, which includes using toliet, bedpan, or urinal?: A Little Help from another person bathing (including washing, rinsing, drying)?: A Little Help from another person to put on and taking off regular upper body clothing?: None Help from another person to put on and taking off regular lower body clothing?: A Little 6 Click Score: 20    End of Session Equipment Utilized During  Treatment: Rolling walker (2 wheels)  OT Visit Diagnosis: Other abnormalities of gait and mobility (R26.89)   Activity Tolerance Patient tolerated treatment well   Patient Left in bed;with call bell/phone within reach;with bed alarm set   Nurse Communication          Time: 1610-9604 OT Time Calculation (min): 19 min  Charges: OT General Charges $OT Visit: 1 Visit OT Treatments $Self Care/Home Management : 8-22 mins  Rockney Ghee, M.S., OTR/L 08/29/22, 11:37 AM

## 2022-08-29 NOTE — Progress Notes (Addendum)
Inpatient Rehab Admissions Coordinator:    I have insurance approval and can move this Pt. To CIR today. RN may call report to [phone].   Pt. Is in agreement to admit to CIR for an intensive rehab program for an estimated 7-10 days with the goal of discharging at modified independent level and with assistance from his wife, who has confirmed ability to provide intermittent support at discharged.    Megan Salon, MS, CCC-SLP Rehab Admissions Coordinator  [phone] (celll) [phone] (office)

## 2022-08-29 NOTE — Progress Notes (Signed)
Inpatient Rehabilitation Admission Medication Review by a Pharmacist  A complete drug regimen review was completed for this patient to identify any potential clinically significant medication issues.  High Risk Drug Classes Is patient taking? Indication by Medication  Antipsychotic Yes Compazine prn N/V  Anticoagulant Yes Eliquis - Afib  Antibiotic Yes, as an intravenous medication Meropenem for toe necrosis until 09/19/2022  Opioid Yes Oxycodone prn severe pain Tramadol - prn moderate pain  Antiplatelet Yes Clopidogrel - CAD  Hypoglycemics/insulin Yes Insulin - DM  Vasoactive Medication Yes Metoprolol - HTN  Chemotherapy No   Other Yes Trazodone prn sleep Gabapentin - neuropathy Duo-neb prn SOB     Type of Medication Issue Identified Description of Issue Recommendation(s)  Drug Interaction(s) (clinically significant)     Duplicate Therapy     Allergy     No Medication Administration End Date     Incorrect Dose     Additional Drug Therapy Needed     Significant med changes from prior encounter (inform family/care partners about these prior to discharge). Lisinopril 20 mg po daily Metformin 500 po BID Pravastatin 80 mg daily Trulicity  Resume if and when appropriate during rehab or on discharge from rehab (on discharge medication list)  Other       Clinically significant medication issues were identified that warrant physician communication and completion of prescribed/recommended actions by midnight of the next day:  No  Pharmacist comments: None  Time spent performing this drug regimen review (minutes):  20 minutes  Thank you Okey Regal, PharmD

## 2022-08-29 NOTE — Discharge Summary (Signed)
Physician Discharge Summary  Andrew Harding OZH:[id number] DOB: Jul 05, 1950 DOA: 08/17/2022  PCP: Lynnea Ferrier, MD  Admit date: 08/17/2022 Discharge date: 08/29/2022  Admitted From: Disposition:    Recommendations for Outpatient Follow-up:  Continue meropenem per ID.  Last day of antibiotic 09/19/2022. Outpatient follow-up with infectious disease Dr. Rivka Safer on 09/14/2022 8:45 am Recs: Non weight bearing left foot. Change dressing every other day per Podiatry. Follow up with Podiatry in 2 weeks  Discontinue aspirin, continue patient on Plavix and Eliquis Holding pravastatin until LFTs normalize.  Repeat LFTs in 1 week  Home Health: Equipment/Devices: Discharge Condition: Stable CODE STATUS:  Diet recommendation:   Brief/Interim Summary: 72-year-old with history of poorly controlled insulin-dependent DM 2, peripheral neuropathy, HLD, HTN, CAD status post CABG sent to the hospital from Dr. Irene Limbo office due to worsening left foot swelling and erythema.  Patient also had a syncopal episode.  Patient underwent left lower extremity angiogram by vascular on 4/23.  Successful recannulization was performed with improvement in blood flows.  Infectious disease is currently following, antibiotics per their recommendations.  Patient underwent I&D of multiple sites of left foot and left ankle by podiatry, cultures were sent. PICC line placed on 4/26.  Patient being transferred to CIR   Discharge Diagnoses:  Principal Problem:   Sepsis (HCC) Active Problems:   Type 2 diabetes mellitus with stage 3b chronic kidney disease, with long-term current use of insulin (HCC)   Cellulitis   Hypotension due to hypovolemia   Infection of left foot   Atrial fibrillation (HCC)   Acute osteomyelitis of left ankle or foot (HCC)   Medication management   Diabetic infection of left foot (HCC)    Sepsis secondary to cellulitis left foot postoperatively left second third fourth toe necrosis status post  amputation one week ago -Sepsis physiology is improved.  ID, vascular and podiatry following - Continue meropenem per ID.  Last day of antibiotic 09/19/2022. Outpatient follow-up with infectious disease Dr. Rivka Safer on 09/14/2022 8:45 am -Underwent LLE angiogram by vascular 4/23, successful recannulization and improvement in blood flows -Status post I&D by podiatry on 4/24.  Podiatry following -Recs: Non weight bearing left foot. Change dressing every other day per Podiatry. Follow up with Podiatry in 2 weeks    PICC PLACED 08/24/25   Wound culture 4/19: MODERATE ENTEROBACTER CLOACAE  MODERATE ENTEROCOCCUS FAECALIS      Atrial fibrillation, New, rate controlled H/o CAD,s/p CABG x1 in 2014 Seen by [facility] cardiology.  On Eliquis and Plavix. Discontinued aspirin Echo shows preserved EF   Transaminitis, improved - Hepatitis panel negative.  Repeat CMP with PCP outpatient. Statin remains on hold.    Peripheral arterial disease - Now on Eliquis and plavix.  Eventually will need statin once LFTs improved - LDL 54   Uncontrolled type II diabetes with peripheral neuropathy and CKD stage IIIB -A1c 6.9.  resume home meds   Hypotension with syncopal episode in the setting of pre-renal azotemia in post-op period CT head, CTA chest unremarkable.  Echocardiogram shows preserved EF   CKD stage IIIB -Creatinine stable around 1.2   Hyperlipidemia -- continue statins      Consultations: Infectious disease Podiatry Vascular [facility] cardiology  Subjective: Feels well no complaints  Discharge Exam: Vitals:   08/29/22 0407 08/29/22 0820  BP: 111/76 114/79  Pulse: 73 86  Resp: 17 18  Temp: 98.2 F (36.8 C) 97.9 F (36.6 C)  SpO2: 100% 97%   Vitals:   08/28/22 2001 08/28/22 2224 08/29/22  0407 08/29/22 0820  BP: 112/72 121/66 111/76 114/79  Pulse: 96 80 73 86  Resp: 16 16 17 18   Temp: 98.2 F (36.8 C)  98.2 F (36.8 C) 97.9 F (36.6 C)  TempSrc: Oral   Oral  SpO2: 98% 96% 100% 97%   Weight:      Height:        General: Pt is alert, awake, not in acute distress Cardiovascular: RRR, S1/S2 +, no rubs, no gallops Respiratory: CTA bilaterally, no wheezing, no rhonchi Abdominal: Soft, NT, ND, bowel sounds + Extremities: no edema, no cyanosis.  Left foot dressing in place  Discharge Instructions  Discharge Instructions     [facility] pharmacist to adjust dose for Vancomycin, Aminoglycosides and other anti-infective therapies as requested by physician.   Complete by: As directed    [facility] to provide Cath Flo 2mg    Complete by: As directed    Administer for PICC line occlusion and as ordered by physician for other access device issues.   Anaphylaxis Kit: Provided to treat any anaphylactic reaction to the medication being provided to the patient if First Dose or when requested by physician   Complete by: As directed    Epinephrine 1mg /ml vial / amp: Administer 0.3mg  (0.46ml) subcutaneously once for moderate to severe anaphylaxis, nurse to call physician and pharmacy when reaction occurs and call 911 if needed for immediate care   Diphenhydramine 50mg /ml IV vial: Administer 25-50mg  IV/IM PRN for first dose reaction, rash, itching, mild reaction, nurse to call physician and pharmacy when reaction occurs   Sodium Chloride 0.9% NS IV: Administer if needed for hypovolemic blood pressure drop or as ordered by physician after call to physician with anaphylactic reaction   Change dressing   Complete by: As directed    Change dressing to left foot every other day.  Remove dressing and gently cleanse distal incision with saline.  Paint incision with Betadine and cover with bulky sterile gauze dressing.   Change dressing on IV access line weekly and PRN   Complete by: As directed    Flush IV access with Sodium Chloride 0.9% and Heparin 10 units/ml or 100 units/ml   Complete by: As directed    Home infusion instructions - [facility]    Complete by: As directed    Instructions: Flush IV access with Sodium Chloride 0.9% and Heparin 10units/ml or 100units/ml   Change dressing on IV access line: Weekly and PRN   Instructions Cath Flo 2mg : Administer for PICC Line occlusion and as ordered by physician for other access device   [facility] pharmacist to adjust dose for: Vancomycin, Aminoglycosides and other anti-infective therapies as requested by physician   Method of administration may be changed at the discretion of home infusion pharmacist based upon assessment of the patient and/or caregiver's ability to self-administer the medication ordered   Complete by: As directed       Allergies as of 08/29/2022   No Known Allergies      Medication List     STOP taking these medications    aspirin EC 81 MG tablet   cefdinir 300 MG capsule Commonly known as: OMNICEF       TAKE these medications    apixaban 5 MG Tabs tablet Commonly known as: ELIQUIS Take 1 tablet (5 mg total) by mouth 2 (two) times daily.   betamethasone dipropionate 0.05 % cream Apply topically 2 (two) times daily.   cholecalciferol 1000 units tablet Commonly  known as: VITAMIN D Take 2,000 Units by mouth daily.   clopidogrel 75 MG tablet Commonly known as: PLAVIX Take 1 tablet (75 mg total) by mouth daily with breakfast. Start taking on: Aug 30, 2022   docusate sodium 100 MG capsule Commonly known as: COLACE Take 1 capsule (100 mg total) by mouth 2 (two) times daily.   Fifty50 Pen Needles 32G X 4 MM Misc Generic drug: Insulin Pen Needle USE 4 TIMES DAILY   gabapentin 300 MG capsule Commonly known as: NEURONTIN Take 300 mg by mouth 3 (three) times daily.   insulin glargine 100 UNIT/ML injection Commonly known as: LANTUS Inject 52 Units into the skin at bedtime.   lisinopril 20 MG tablet Commonly known as: ZESTRIL Take 20 mg by mouth daily.   meropenem  IVPB Commonly known as: MERREM Inject 1 g into the vein every 8  (eight) hours for 21 days. Indication:  DFU/osteomyelitis  Last Day of Therapy:  09/19/22 Labs - Once weekly:  CBC/D, CMP, ESR and CRP Please leave PICC in place until doctor has seen patient or been notified Fax weekly labs to [phone] Method of administration: Mini-Bag Plus / Gravity Method of administration may be changed at the discretion of home infusion pharmacist based upon assessment of the patient and/or caregiver's ability to self-administer the medication ordered.   metFORMIN 500 MG 24 hr tablet Commonly known as: GLUCOPHAGE-XR 500 mg 2 (two) times daily with a meal.   metoprolol tartrate 25 MG tablet Commonly known as: LOPRESSOR Take 1 tablet (25 mg total) by mouth 2 (two) times daily.   NovoLOG FlexPen 100 UNIT/ML FlexPen Generic drug: insulin aspart Inject 15-20 Units into the skin 3 (three) times daily with meals.   OneTouch Ultra test strip Generic drug: glucose blood Use 3 (three) times daily   oxyCODONE 5 MG immediate release tablet Commonly known as: Oxy IR/ROXICODONE Take 1-2 tablets (5-10 mg total) by mouth every 4 (four) hours as needed for moderate pain.   pravastatin 80 MG tablet Commonly known as: PRAVACHOL TAKE ONE TABLET BY MOUTH AT NIGHT   silver sulfADIAZINE 1 % cream Commonly known as: SILVADENE Apply topically.   traZODone 50 MG tablet Commonly known as: DESYREL TAKE ONE TABLET AT BEDTIME AS NEEDED   Trulicity 4.5 MG/0.5ML Sopn Generic drug: Dulaglutide Inject into the skin.               Discharge Care Instructions  (From admission, onward)           Start     Ordered   08/29/22 0000  Change dressing on IV access line weekly and PRN  (Home infusion instructions - [facility] )        08/29/22 1150   08/26/22 0000  Change dressing       Comments: Change dressing to left foot every other day.  Remove dressing and gently cleanse distal incision with saline.  Paint incision with Betadine and cover with bulky  sterile gauze dressing.   08/26/22 0955            Follow-up Information     Tiajuana Amass, MD. Go in 1 week(s).   Specialty: Cardiology Contact information: [address] [place] [address] [phone]         [name], DPM Follow up in 2 week(s).   Specialty: Podiatry Contact information: [address] [place] [address] [phone]         Georgiana Spinner, NP Follow  up in 1 month(s).   Specialty: Vascular Surgery Why: Bilateral Lower extremity Arterial Duplex US with ABI's Contact information: [address] [name] [address] [phone]         Curtis Sites III, MD Follow up in 1 week(s).   Specialty: Internal Medicine Contact information: [address] [facility] [place] [address] [phone]                No Known Allergies  You were cared for by a hospitalist during your hospital stay. If you have any questions about your discharge medications or the care you received while you were in the hospital after you are discharged, you can call the unit and asked to speak with the hospitalist on call if the hospitalist that took care of you is not available. Once you are discharged, your primary care physician will handle any further medical issues. Please note that no refills for any discharge medications will be authorized once you are discharged, as it is imperative that you return to your primary care physician (or establish a relationship with a primary care physician if you do not have one) for your aftercare needs so that they can reassess your need for medications and monitor your lab values.  You were cared for by a hospitalist during your hospital stay. If you have any questions about your discharge medications or the care you received while you were in the hospital after you are discharged, you can call the unit and asked to speak with the hospitalist on call if the  hospitalist that took care of you is not available. Once you are discharged, your primary care physician will handle any further medical issues. Please note that NO REFILLS for any discharge medications will be authorized once you are discharged, as it is imperative that you return to your primary care physician (or establish a relationship with a primary care physician if you do not have one) for your aftercare needs so that they can reassess your need for medications and monitor your lab values.  Please request your Prim.MD to go over all Hospital Tests and Procedure/Radiological results at the follow up, please get all Hospital records sent to your Prim MD by signing hospital release before you go home.  Get CBC, CMP, 2 view Chest X ray checked  by Primary MD during your next visit or SNF MD in 5-7 days ( we routinely change or add medications that can affect your baseline labs and fluid status, therefore we recommend that you get the mentioned basic workup next visit with your PCP, your PCP may decide not to get them or add new tests based on their clinical decision)  On your next visit with your primary care physician please Get Medicines reviewed and adjusted.  If you experience worsening of your admission symptoms, develop shortness of breath, life threatening emergency, suicidal or homicidal thoughts you must seek medical attention immediately by calling 911 or calling your MD immediately  if symptoms less severe.  You Must read complete instructions/literature along with all the possible adverse reactions/side effects for all the Medicines you take and that have been prescribed to you. Take any new Medicines after you have completely understood and accpet all the possible adverse reactions/side effects.   Do not drive, operate heavy machinery, perform activities at heights, swimming or participation in water activities or provide baby sitting services if your were admitted for syncope or  siezures until you have seen  by Primary MD or a Neurologist and advised to do so again.  Do not drive when taking Pain medications.   Procedures/Studies: DG Chest Port 1 View  Result Date: 08/25/2022 CLINICAL DATA:  Status post PICC line placement EXAM: PORTABLE CHEST 1 VIEW COMPARISON:  08/17/2022 FINDINGS: Right arm PICC line tip terminates in the superior cavoatrial junction. Heart size is normal. No pleural fluid or airspace disease. Visualized osseous structures are unremarkable. IMPRESSION: Right arm PICC line tip terminates in the superior cavoatrial junction. Electronically Signed   By: Signa Kell M.D.   On: 08/25/2022 14:16   [place] EKG SITE RITE  Result Date: 08/25/2022 If Site Rite image not attached, placement could not be confirmed due to current cardiac rhythm.  ECHOCARDIOGRAM COMPLETE  Result Date: 08/23/2022    ECHOCARDIOGRAM REPORT   Patient Name:   JAKEVION ARNEY Date of Exam: 08/21/2022 Medical Rec #:  [id number]   Height:       75.0 in Accession #:    [id number]  Weight:       250.0 lb Date of Birth:  [birth year]    BSA:          2.413 m Patient Age:    72 years    BP:           132/64 mmHg Patient Gender: M           HR:           87 bpm. Exam Location:  [facility] Procedure: 2D Echo, Cardiac Doppler and Color Doppler Indications:     Atrial Fibrillation I48.91  History:         Patient has no prior history of Echocardiogram examinations.                  Prior CABG; Risk Factors:Hypertension, Dyslipidemia and                  Diabetes.  Sonographer:     Cristela Blue Referring Phys:  [id number] LILY MICHELLE TANG Diagnosing Phys: Alwyn Pea MD IMPRESSIONS  1. Left ventricular ejection fraction, by estimation, is 65 to 70%. The left ventricle has normal function. The left ventricle demonstrates regional wall motion abnormalities (see scoring diagram/findings for description). Left ventricular diastolic parameters were normal.  2. Right ventricular systolic function is normal. The right  ventricular size is normal.  3. The mitral valve is normal in structure. No evidence of mitral valve regurgitation.  4. The aortic valve is normal in structure. Aortic valve regurgitation is not visualized. FINDINGS  Left Ventricle: Left ventricular ejection fraction, by estimation, is 65 to 70%. The left ventricle has normal function. The left ventricle demonstrates regional wall motion abnormalities. The left ventricular internal cavity size was normal in size. There is no left ventricular hypertrophy. Left ventricular diastolic parameters were normal. Right Ventricle: The right ventricular size is normal. No increase in right ventricular wall thickness. Right ventricular systolic function is normal. Left Atrium: Left atrial size was normal in size. Right Atrium: Right atrial size was normal in size. Pericardium: There is no evidence of pericardial effusion. Mitral Valve: The mitral valve is normal in structure. No evidence of mitral valve regurgitation. MV peak gradient, 6.5 mmHg. The mean mitral valve gradient is 3.0 mmHg. Tricuspid Valve: The tricuspid valve is normal in structure. Tricuspid valve regurgitation is trivial. Aortic Valve: The aortic valve is normal in structure. Aortic valve regurgitation is not visualized. Aortic valve mean gradient measures 2.0 mmHg. Aortic valve peak  gradient measures 3.8 mmHg. Aortic valve area, by VTI measures 3.63 cm. Pulmonic Valve: The pulmonic valve was grossly normal. Pulmonic valve regurgitation is not visualized. Aorta: The ascending aorta was not well visualized. IAS/Shunts: No atrial level shunt detected by color flow Doppler.  LEFT VENTRICLE PLAX 2D LVIDd:         4.40 cm LVIDs:         2.70 cm LV PW:         1.00 cm LV IVS:        1.10 cm LVOT diam:     2.20 cm LV SV:         56 LV SV Index:   23 LVOT Area:     3.80 cm  RIGHT VENTRICLE RV Basal diam:  3.30 cm RV Mid diam:    3.70 cm RV S prime:     12.20 cm/s TAPSE (M-mode): 1.4 cm LEFT ATRIUM             Index         RIGHT ATRIUM           Index LA diam:        4.30 cm 1.78 cm/m   RA Area:     19.80 cm LA Vol (A2C):   80.0 ml 33.16 ml/m  RA Volume:   57.90 ml  24.00 ml/m LA Vol (A4C):   80.8 ml 33.49 ml/m LA Biplane Vol: 83.0 ml 34.40 ml/m  AORTIC VALVE AV Area (Vmax):    3.11 cm AV Area (Vmean):   3.41 cm AV Area (VTI):     3.63 cm AV Vmax:           97.45 cm/s AV Vmean:          70.150 cm/s AV VTI:            0.154 m AV Peak Grad:      3.8 mmHg AV Mean Grad:      2.0 mmHg LVOT Vmax:         79.80 cm/s LVOT Vmean:        63.000 cm/s LVOT VTI:          0.147 m LVOT/AV VTI ratio: 0.95  AORTA Ao Root diam: 3.50 cm MITRAL VALVE                TRICUSPID VALVE MV Area (PHT): 4.63 cm     TR Peak grad:   25.0 mmHg MV Area VTI:   3.12 cm     TR Vmax:        250.00 cm/s MV Peak grad:  6.5 mmHg MV Mean grad:  3.0 mmHg     SHUNTS MV Vmax:       1.27 m/s     Systemic VTI:  0.15 m MV Vmean:      71.7 cm/s    Systemic Diam: 2.20 cm MV Decel Time: 164 msec MV E velocity: 127.00 cm/s Alwyn Pea MD Electronically signed by Alwyn Pea MD Signature Date/Time: 08/23/2022/7:30:34 AM    Final    PERIPHERAL VASCULAR CATHETERIZATION  Result Date: 08/22/2022 See surgical note for result.  DG Foot Complete Left  Result Date: 08/19/2022 CLINICAL DATA:  Postop. EXAM: LEFT FOOT - COMPLETE 3+ VIEW COMPARISON:  Preoperative exam earlier today FINDINGS: Interval transmetatarsal amputation of all 5 rays. Presumed antibiotic beads projecting over the mid metatarsals as well as the ankle at the level of the medial malleolus. Overlying skin staples in place. IMPRESSION:  Interval transmetatarsal amputation of all 5 rays. Presumed antibiotic beads projecting over the mid metatarsals and medial ankle. Electronically Signed   By: Narda Rutherford M.D.   On: 08/19/2022 16:48   DG Foot 2 Views Left  Result Date: 08/19/2022 CLINICAL DATA:  Infection, patient reports recent surgery. EXAM: LEFT FOOT - 2 VIEW COMPARISON:  Foot MRI  yesterday FINDINGS: Prior amputation of the second and third toes as well as fourth toe at the proximal aspect of the proximal phalanx. Focal defect involving the residual fourth toe proximal phalanx as seen on prior MRI. No definite radiographic correlate to the edematous changes in the second and third metatarsal heads on MRI. No evidence of acute fracture. Soft tissue edema is seen over the dorsum of the forefoot. Vascular calcifications. IMPRESSION: 1. Prior amputation of the second and third toes as well as fourth toe at the proximal phalanx. Small defect in the fourth toe proximal phalanx corresponding to MRI findings. No radiographic correlate to the marrow edema changes in the second and third metatarsal heads on MRI. 2. Forefoot soft tissue edema. Electronically Signed   By: Narda Rutherford M.D.   On: 08/19/2022 16:45   MR FOOT LEFT WO CONTRAST  Result Date: 08/18/2022 CLINICAL DATA:  Foot swelling, diabetic, osteomyelitis suspected, xray done EXAM: MRI OF THE LEFT FOOT WITHOUT CONTRAST TECHNIQUE: Multiplanar, multisequence MR imaging of the left forefoot was performed. No intravenous contrast was administered. COMPARISON:  None Available. FINDINGS: Bones/Joint/Cartilage Postsurgical changes of amputation of the second and third toes at the metatarsophalangeal joint level. Prior fourth toe amputation through the base of the proximal phalanx. Surgical defect or focal erosion involving the distal aspect of the residual fourth toe proximal phalanx (series 9, image 14). There is periosteal edema of the second and third metatarsal heads with relatively mild bone marrow edema. Prominent bone marrow edema within the first and third metatarsal diaphyses and at the proximal metaphysis of the second metatarsal. No well-defined fracture line. Normal alignment. Relatively mild degenerative changes. Ligaments Intact Lisfranc ligament. Remaining collateral ligaments are intact. Muscles and Tendons Chronic  denervation changes of the foot musculature. Amputation changes of the musculotendinous structures of the second through fourth toes. No tenosynovitis. Soft tissues Soft tissue wound or ulceration at the dorsal aspect of the forefoot overlying the second metatarsal head. Additional shallow wounds or ulcerations dorsal to the third and fourth metatarsal heads. Extensive soft tissue edema. No organized or drainable fluid collections. IMPRESSION: 1. Postsurgical changes of amputation of the second through fourth toes. Soft tissue wounds or ulcerations at the dorsal aspect of the forefoot overlying the second metatarsal head. Additional shallow wounds or ulcerations dorsal to the third and fourth metatarsal heads. 2. Small postsurgical defect or focal erosion involving the distal aspect of the residual fourth toe proximal phalanx. Appearance is suspicious for osteomyelitis. 3. Periosteal edema of the second and third metatarsal heads with relatively mild bone marrow edema. Findings are suspicious for early acute osteomyelitis. 4. Prominent bone marrow edema within the first and third metatarsal diaphyses and at the proximal metaphysis of the second metatarsal. No well-defined fracture line. Findings are favored to represent stress-related changes. 5. Extensive soft tissue edema. No organized or drainable fluid collections. Electronically Signed   By: Duanne Guess D.O.   On: 08/18/2022 20:24   CT Angio Chest PE W and/or Wo Contrast  Result Date: 08/17/2022 CLINICAL DATA:  Sepsis. EXAM: CT ANGIOGRAPHY CHEST WITH CONTRAST TECHNIQUE: Multidetector CT imaging of the chest was performed using  the standard protocol during bolus administration of intravenous contrast. Multiplanar CT image reconstructions and MIPs were obtained to evaluate the vascular anatomy. RADIATION DOSE REDUCTION: This exam was performed according to the departmental dose-optimization program which includes automated exposure control, adjustment of  the mA and/or kV according to patient size and/or use of iterative reconstruction technique. CONTRAST:  60mL OMNIPAQUE IOHEXOL 350 MG/ML SOLN COMPARISON:  Chest x-ray 08/17/2022 earlier FINDINGS: Cardiovascular: No pulmonary embolism identified. Status post median sternotomy. Heart is nonenlarged. No significant pericardial effusion. The thoracic aorta has a normal course and caliber with mild atherosclerotic calcified plaque. Coronary artery calcifications are noted. 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 Mediastinum/Nodes: Mildly patulous esophagus. No specific abnormal lymph node enlargement seen in the axillary region, hilum or mediastinum. Lungs/Pleura: Mild diffuse breathing motion. No consolidation, pneumothorax or effusion. Dependent atelectasis. 3 mm nodule right upper lobe on series 5, image 77 is noncalcified. Similar focus on image 56 but this has a component of calcification peripherally. Upper Abdomen: Gallstones in the nondilated gallbladder. There is dystrophic calcification along the left adrenal gland. Please correlate with any previous infectious, inflammatory or traumatic process. This was seen on the CT scan of 2014 of the abdomen and pelvis. Musculoskeletal: Mild degenerative changes along the spine. Review of the MIP images confirms the above findings. IMPRESSION: No pulmonary embolism identified.  Mild breathing motion. Postop chest. Gallstones. 3 mm lung nodules. No follow-up needed if patient is low-risk (and has no known or suspected primary neoplasm). Non-contrast chest CT can be considered in 12 months if patient is high-risk. This recommendation follows the consensus statement: Guidelines for Management of Incidental Pulmonary Nodules Detected on CT Images: From the Fleischner Society 2017; Radiology 2017; 284:228-243. Aortic Atherosclerosis (ICD10-I70.0). Electronically Signed   By: Karen Kays M.D.   On: 08/17/2022 13:43   CT HEAD WO CONTRAST ( )  Result  Date: 08/17/2022 CLINICAL DATA:  Possible wound infection to left foot EXAM: CT HEAD WITHOUT CONTRAST TECHNIQUE: Contiguous axial images were obtained from the base of the skull through the vertex without intravenous contrast. RADIATION DOSE REDUCTION: This exam was performed according to the departmental dose-optimization program which includes automated exposure control, adjustment of the mA and/or kV according to patient size and/or use of iterative reconstruction technique. COMPARISON:  None Available. FINDINGS: Brain: No evidence of acute infarction, hemorrhage, mass, mass effect, or midline shift. No hydrocephalus or extra-axial fluid collection. Vascular: No hyperdense vessel. Atherosclerotic calcifications in the intracranial carotid and vertebral arteries. Skull: Negative for fracture or focal lesion. Sinuses/Orbits: Mucosal thickening in the ethmoid air cells and left maxillary sinus. No acute finding in the orbits. Status post bilateral lens replacements. Other: The mastoid air cells are well aerated. IMPRESSION: No acute intracranial process. Electronically Signed   By: Wiliam Ke M.D.   On: 08/17/2022 13:35   DG Chest 2 View  Result Date: 08/17/2022 CLINICAL DATA:  Sepsis EXAM: CHEST - 2 VIEW COMPARISON:  CXR 07/19/12 FINDINGS: Status median sternotomy and CABG. No pleural effusion. No pneumothorax. Normal cardiac and mediastinal contours. No focal airspace opacity. No radiographically apparent displaced rib fractures. Vertebral body heights are visualized upper abdomen is unremarkable IMPRESSION: No focal airspace opacity Electronically Signed   By: Lorenza Cambridge M.D.   On: 08/17/2022 10:20   DG MINI C-ARM IMAGE ONLY  Result Date: 08/11/2022 There is no interpretation for this exam.  This order is for images obtained during a surgical procedure.  Please See "Surgeries" Tab for more  information regarding the procedure.     The results of significant diagnostics from this hospitalization  (including imaging, microbiology, ancillary and laboratory) are listed below for reference.     Microbiology: Recent Results (from the past 240 hour(s))  Aerobic/Anaerobic Culture w Gram Stain (surgical/deep wound)     Status: None   Collection Time: 08/23/22  2:43 PM   Specimen: Path fluid; Body Fluid  Result Value Ref Range Status   Specimen Description WOUND LEFT FOOT  Final   Special Requests DRAIN  Final   Gram Stain NO WBC SEEN NO ORGANISMS SEEN   Final   Culture   Final    No growth aerobically or anaerobically. Performed at [facility], [address], [place], [place] [address]    Report Status 08/27/2022 FINAL  Final     Labs: BNP (last 3 results) Recent Labs    08/17/22 0944  BNP 137.3*   Basic Metabolic Panel: Recent Labs  Lab 08/25/22 0451 08/26/22 0500 08/27/22 0511 08/28/22 0510 08/29/22 0433  NA 138 138 136 137 138  K 3.7 3.4* 3.9 4.1 4.2  CL 104 104 103 100 103  CO2 26 26 29 27 25   GLUCOSE 121* 117* 107* 154* 145*  BUN 15 18 21 23  24*  CREATININE 1.19 1.18 1.21 1.14 1.13  CALCIUM 8.3* 8.5* 8.3* 8.5* 8.6*  MG  --   --  2.0 2.2 2.0   Liver Function Tests: Recent Labs  Lab 08/25/22 0451 08/26/22 0500 08/27/22 0511 08/28/22 0510 08/29/22 0433  AST 51* 68* 67* 71* 70*  ALT 63* 64* 64* 64* 64*  ALKPHOS 163* 157* 133* 131* 125  BILITOT 0.7 0.4 0.5 0.4 0.6  PROT 6.6 6.6 6.7 6.9 7.1  ALBUMIN 2.2* 2.2* 2.3* 2.5* 2.5*   No results for input(s): "LIPASE", "AMYLASE" in the last 168 hours. No results for input(s): "AMMONIA" in the last 168 hours. CBC: Recent Labs  Lab 08/25/22 0451 08/26/22 0500 08/27/22 0511 08/28/22 0510 08/29/22 0433  WBC 8.5 8.5 8.7 8.5 8.5  HGB 9.0* 8.9* 9.0* 9.2* 9.5*  HCT 27.9* 27.6* 28.4* 29.1* 30.2*  MCV 94.6 92.9 94.0 93.9 94.7  PLT 408* 417* 430* 409* 405*   Cardiac Enzymes: No results for input(s): "CKTOTAL", "CKMB", "CKMBINDEX", "TROPONINI" in the last 168 hours. BNP: Invalid input(s):  "POCBNP" CBG: Recent Labs  Lab 08/28/22 1203 08/28/22 1657 08/28/22 2108 08/29/22 0811 08/29/22 1149  GLUCAP 181* 267* 242* 159* 134*   D-Dimer No results for input(s): "DDIMER" in the last 72 hours. Hgb A1c No results for input(s): "HGBA1C" in the last 72 hours. Lipid Profile No results for input(s): "CHOL", "HDL", "LDLCALC", "TRIG", "CHOLHDL", "LDLDIRECT" in the last 72 hours. Thyroid function studies No results for input(s): "TSH", "T4TOTAL", "T3FREE", "THYROIDAB" in the last 72 hours.  Invalid input(s): "FREET3" Anemia work up No results for input(s): "VITAMINB12", "FOLATE", "FERRITIN", "TIBC", "IRON", "RETICCTPCT" in the last 72 hours. Urinalysis    Component Value Date/Time   COLORURINE YELLOW (A) 08/17/2022 2100   APPEARANCEUR HAZY (A) 08/17/2022 2100   APPEARANCEUR Hazy (A) 05/30/2022 1026   LABSPEC 1.041 (H) 08/17/2022 2100   PHURINE 5.0 08/17/2022 2100   GLUCOSEU 50 (A) 08/17/2022 2100   HGBUR SMALL (A) 08/17/2022 2100   BILIRUBINUR NEGATIVE 08/17/2022 2100   BILIRUBINUR Negative 05/30/2022 1026   KETONESUR NEGATIVE 08/17/2022 2100   PROTEINUR 30 (A) 08/17/2022 2100   UROBILINOGEN 0.2 04/05/2018 1042   NITRITE NEGATIVE 08/17/2022 2100   LEUKOCYTESUR NEGATIVE 08/17/2022  2100   Sepsis Labs Recent Labs  Lab 08/26/22 0500 08/27/22 0511 08/28/22 0510 08/29/22 0433  WBC 8.5 8.7 8.5 8.5   Microbiology Recent Results (from the past 240 hour(s))  Aerobic/Anaerobic Culture w Gram Stain (surgical/deep wound)     Status: None   Collection Time: 08/23/22  2:43 PM   Specimen: Path fluid; Body Fluid  Result Value Ref Range Status   Specimen Description WOUND LEFT FOOT  Final   Special Requests DRAIN  Final   Gram Stain NO WBC SEEN NO ORGANISMS SEEN   Final   Culture   Final    No growth aerobically or anaerobically. Performed at [facility], [address], [place], [place] [address]    Report Status 08/27/2022 FINAL  Final     Time coordinating  discharge:  I have spent 35 minutes face to face with the patient and on the ward discussing the patients care, assessment, plan and disposition with other care givers. >50% of the time was devoted counseling the patient about the risks and benefits of treatment/Discharge disposition and coordinating care.   SIGNED:   Dimple Nanas, MD  [facility] 08/29/2022, 11:50 AM   If 7PM-7AM, please contact night-coverage

## 2022-08-29 NOTE — TOC Progression Note (Signed)
Transition of Care [facility]) - Progression Note    Patient Details  Name: Andrew Harding MRN: [id number] Date of Birth: [birth year]  Transition of Care [facility]) CM/SW Contact  Chapman Fitch, RN Phone Number: 08/29/2022, 9:25 AM  Clinical Narrative:        Per CIR admissions insurance auth still pending     Expected Discharge Plan and Services                                               Social Determinants of Health (SDOH) Interventions SDOH Screenings   Food Insecurity: No Food Insecurity (08/19/2022)  Housing: Low Risk  (08/19/2022)  Transportation Needs: No Transportation Needs (08/19/2022)  Utilities: Not At Risk (08/19/2022)  Tobacco Use: High Risk (08/24/2022)    Readmission Risk Interventions     No data to display

## 2022-08-30 DIAGNOSIS — Z89432 Acquired absence of left foot: Secondary | ICD-10-CM

## 2022-08-30 DIAGNOSIS — D649 Anemia, unspecified: Secondary | ICD-10-CM

## 2022-08-30 DIAGNOSIS — M86172 Other acute osteomyelitis, left ankle and foot: Secondary | ICD-10-CM | POA: Diagnosis not present

## 2022-08-30 DIAGNOSIS — N189 Chronic kidney disease, unspecified: Secondary | ICD-10-CM | POA: Diagnosis not present

## 2022-08-30 DIAGNOSIS — I10 Essential (primary) hypertension: Secondary | ICD-10-CM

## 2022-08-30 DIAGNOSIS — R7989 Other specified abnormal findings of blood chemistry: Secondary | ICD-10-CM

## 2022-08-30 LAB — CBC WITH DIFFERENTIAL/PLATELET
Abs Immature Granulocytes: 0.09 10*3/uL — ABNORMAL HIGH (ref 0.00–0.07)
Basophils Absolute: 0.1 10*3/uL (ref 0.0–0.1)
Basophils Relative: 1 %
Eosinophils Absolute: 0.3 10*3/uL (ref 0.0–0.5)
Eosinophils Relative: 3 %
HCT: 31.4 % — ABNORMAL LOW (ref 39.0–52.0)
Hemoglobin: 10 g/dL — ABNORMAL LOW (ref 13.0–17.0)
Immature Granulocytes: 1 %
Lymphocytes Relative: 14 %
Lymphs Abs: 1.2 10*3/uL (ref 0.7–4.0)
MCH: 30 pg (ref 26.0–34.0)
MCHC: 31.8 g/dL (ref 30.0–36.0)
MCV: 94.3 fL (ref 80.0–100.0)
Monocytes Absolute: 0.5 10*3/uL (ref 0.1–1.0)
Monocytes Relative: 6 %
Neutro Abs: 6.5 10*3/uL (ref 1.7–7.7)
Neutrophils Relative %: 75 %
Platelets: 413 10*3/uL — ABNORMAL HIGH (ref 150–400)
RBC: 3.33 MIL/uL — ABNORMAL LOW (ref 4.22–5.81)
RDW: 13.1 % (ref 11.5–15.5)
WBC: 8.5 10*3/uL (ref 4.0–10.5)
nRBC: 0 % (ref 0.0–0.2)

## 2022-08-30 LAB — COMPREHENSIVE METABOLIC PANEL
ALT: 56 U/L — ABNORMAL HIGH (ref 0–44)
AST: 53 U/L — ABNORMAL HIGH (ref 15–41)
Albumin: 2.3 g/dL — ABNORMAL LOW (ref 3.5–5.0)
Alkaline Phosphatase: 121 U/L (ref 38–126)
Anion gap: 8 (ref 5–15)
BUN: 23 mg/dL (ref 8–23)
CO2: 25 mmol/L (ref 22–32)
Calcium: 8.7 mg/dL — ABNORMAL LOW (ref 8.9–10.3)
Chloride: 103 mmol/L (ref 98–111)
Creatinine, Ser: 1.21 mg/dL (ref 0.61–1.24)
GFR, Estimated: >60 mL/min (ref >60)
Glucose, Bld: 132 mg/dL — ABNORMAL HIGH (ref 70–99)
Potassium: 4.3 mmol/L (ref 3.5–5.1)
Sodium: 136 mmol/L (ref 135–145)
Total Bilirubin: 0.5 mg/dL (ref 0.3–1.2)
Total Protein: 7.2 g/dL (ref 6.5–8.1)

## 2022-08-30 LAB — GLUCOSE, CAPILLARY
Glucose-Capillary: 132 mg/dL — ABNORMAL HIGH (ref 70–99)
Glucose-Capillary: 230 mg/dL — ABNORMAL HIGH (ref 70–99)
Glucose-Capillary: 254 mg/dL — ABNORMAL HIGH (ref 70–99)
Glucose-Capillary: 207 mg/dL — ABNORMAL HIGH (ref 70–99)
Glucose-Capillary: 193 mg/dL — ABNORMAL HIGH (ref 70–99)

## 2022-08-30 MED ORDER — ACETAMINOPHEN 325 MG PO TABS: 325.0000 mg | ORAL_TABLET | ORAL | Status: DC | PRN

## 2022-08-30 MED ORDER — ALTEPLASE 2 MG IJ SOLR
2.0000 mg | Freq: Once | INTRAMUSCULAR | Status: AC
  Administered 2022-08-30: 2 mg
  Filled 2022-08-30: qty 2

## 2022-08-30 MED ORDER — NYSTATIN 100000 UNIT/ML MT SUSP
5.0000 mL | Freq: Four times a day (QID) | OROMUCOSAL | Status: DC
  Administered 2022-08-30 – 2022-09-05 (×25): 500000 [IU] via ORAL
  Filled 2022-08-30 (×24): qty 5

## 2022-08-30 NOTE — Patient Care Conference (Signed)
Inpatient RehabilitationTeam Conference and Plan of Care Update Date: 08/30/2022   Time: 12:08 PM    Patient Name: Andrew Harding      Medical Record Number: [id number]  Date of Birth: 03-24-1951 Sex: Male         Room/Bed: 4W05C/4W05C-01 Payor Info: Payor: CIG[employer] / Plan: CIGNA MANAGED / Product Type: *No Product type* /    Admit Date/Time:  08/29/2022  3:26 PM  Primary Diagnosis:  Osteomyelitis of foot, left, acute [facility])  Hospital Problems: Principal Problem:   Osteomyelitis of foot, left, acute (HCC) Active Problems:   S/P transmetatarsal amputation of foot, left [facility])    Expected Discharge Date: Expected Discharge Date: 09/05/22  Team Members Present: Physician leading conference: Dr. Fanny Dance Social Worker Present: Dossie Der, LCSW Nurse Present: Chana Bode, RN PT Present: Other (comment) Ambrose Finland, PT) OT Present: Jake Shark, OT PPS Coordinator present : Fae Pippin, SLP     Current Status/Progress Goal Weekly Team Focus  Bowel/Bladder   patient continent to bowel and bladder   paint to remain continent   assess    Swallow/Nutrition/ Hydration               ADL's   mod I UB ADLs, CGA LB and CGA transfers with the RW. Good adherence to weightbearing precautions, poor endurance   mod I   ADLs, transfers, IADLs, generalized endurance    Mobility   standing with CGA, standing tolreance limited by dizziness; bed mobility independent, squat pivot with min A for maintenance of L LE NWB precautions.   mod I  increase standing tolerance, assess locomotion, increase safety awareness with NWBing precautions    Communication                Safety/Cognition/ Behavioral Observations               Pain     Neuropathic pain patient to remain pain free or goal of <3/10   assess every shift and prn    Skin   surigacal incision to left foot, metatarsal amputation   wound to show signs of healing, and no S/S of infection  daily dressing  changes per order, assess every shift and prn      Discharge Planning:  new evaluation-home alone with friends checking on-insurance will be a barrier for home IV antibiotics and follow up therapies   Team Discussion: Patient with tachycardia, hypotension with dizziness post left transmet. Amputation. Limited by balance issues, poor endurance and poor safety awareness with NWB precautions with a post op shoe. IV abx. ordered 4-6 weeks - 09/19/22.  Patient on target to meet rehab goals: yes, currently CGA with ADLs. Goals for discharge set for Mod I overall.  *See Care Plan and progress notes for long and short-term goals.   Revisions to Treatment Plan:  N/a   Teaching Needs: Safety, medications, weight bearing precautions, incision care, infection prevention, transfers, etc.  Current Barriers to Discharge: Home enviroment access/layout, IV antibiotics, Wound care, Lack of/limited family support, and Weight bearing restrictions  Possible Resolutions to Barriers: HH follow up services     Medical Summary Current Status: L TMA, Fatigue, DM 2, HTN, CAD, PAF, elevated FLTs, ANemia,  IV abx  Barriers to Discharge: Self-care education;Infection/IV Antibiotics;Medical stability  Barriers to Discharge Comments: L TMA, Fatigue, DM 2, HTN, CAD, PAF, elevated FLTs, ANemia, IV abx Possible Resolutions to [employer] Focus: monitor CBC, lafts, renal function, , has limited help after discharge   Continued Need  for Acute Rehabilitation Level of Care: The patient requires daily medical management by a physician with specialized training in physical medicine and rehabilitation for the following reasons: Direction of a multidisciplinary physical rehabilitation program to maximize functional independence : Yes Medical management of patient stability for increased activity during participation in an intensive rehabilitation regime.: Yes Analysis of laboratory values and/or radiology reports with  any subsequent need for medication adjustment and/or medical intervention. : Yes   I attest that I was present, lead the team conference, and concur with the assessment and plan of the team.   Chana Bode B 08/30/2022, 2:03 PM

## 2022-08-30 NOTE — Plan of Care (Signed)
  Problem: RH Balance Goal: LTG Patient will maintain dynamic standing balance (PT) Description: LTG:  Patient will maintain dynamic standing balance with assistance during mobility activities (PT) Flowsheets (Taken 08/30/2022 1626) LTG: Pt will maintain dynamic standing balance during mobility activities with:: Independent with assistive device    Problem: Sit to Stand Goal: LTG:  Patient will perform sit to stand with assistance level (PT) Description: LTG:  Patient will perform sit to stand with assistance level (PT) Flowsheets (Taken 08/30/2022 1626) LTG: PT will perform sit to stand in preparation for functional mobility with assistance level: Independent with assistive device   Problem: RH Bed Mobility Goal: LTG Patient will perform bed mobility with assist (PT) Description: LTG: Patient will perform bed mobility with assistance, with/without cues (PT). Flowsheets (Taken 08/30/2022 1626) LTG: Pt will perform bed mobility with assistance level of: Independent with assistive device    Problem: RH Bed to Chair Transfers Goal: LTG Patient will perform bed/chair transfers w/assist (PT) Description: LTG: Patient will perform bed to chair transfers with assistance (PT). Flowsheets (Taken 08/30/2022 1626) LTG: Pt will perform Bed to Chair Transfers with assistance level: Independent with assistive device    Problem: RH Car Transfers Goal: LTG Patient will perform car transfers with assist (PT) Description: LTG: Patient will perform car transfers with assistance (PT). Flowsheets (Taken 08/30/2022 1626) LTG: Pt will perform car transfers with assist:: Independent with assistive device    Problem: RH Floor Transfers Goal: LTG Patient will perform floor transfers w/assist (PT) Description: LTG: Patient will perform floor transfers with assistance (PT). Flowsheets (Taken 08/30/2022 1626) LTG: PT WILL PERFORM FLOOR TRANFERS  WITH  ASSIST:: Independent with assistive device    Problem: RH  Ambulation Goal: LTG Patient will ambulate in controlled environment (PT) Description: LTG: Patient will ambulate in a controlled environment, # of feet with assistance (PT). Flowsheets (Taken 08/30/2022 1626) LTG: Pt will ambulate in controlled environ  assist needed:: Independent with assistive device LTG: Ambulation distance in controlled environment: 100 Goal: LTG Patient will ambulate in home environment (PT) Description: LTG: Patient will ambulate in home environment, # of feet with assistance (PT). Flowsheets (Taken 08/30/2022 1626) LTG: Pt will ambulate in home environ  assist needed:: Independent with assistive device LTG: Ambulation distance in home environment: 50   Problem: RH Wheelchair Mobility Goal: LTG Patient will propel w/c in controlled environment (PT) Description: LTG: Patient will propel wheelchair in controlled environment, # of feet with assist (PT) Flowsheets (Taken 08/30/2022 1626) LTG: Pt will propel w/c in controlled environ  assist needed:: Independent with assistive device Goal: LTG Patient will propel w/c in home environment (PT) Description: LTG: Patient will propel wheelchair in home environment, # of feet with assistance (PT). Flowsheets (Taken 08/30/2022 1626) LTG: Pt will propel w/c in home environ  assist needed:: Independent with assistive device Goal: LTG Patient will propel w/c in community environment (PT) Description: LTG: Patient will propel wheelchair in community environment, # of feet with assist (PT) Flowsheets (Taken 08/30/2022 1626) LTG: Pt will propel w/c in community environ  assist needed:: Independent with assistive device

## 2022-08-30 NOTE — Progress Notes (Signed)
Inpatient Rehabilitation Center Individual Statement of Services  Patient Name:  Andrew Harding  Date:  08/30/2022  Welcome to the Inpatient Rehabilitation Center.  Our goal is to provide you with an individualized program based on your diagnosis and situation, designed to meet your specific needs.  With this comprehensive rehabilitation program, you will be expected to participate in at least 3 hours of rehabilitation therapies Monday-Friday, with modified therapy programming on the weekends.  Your rehabilitation program will include the following services:  Physical Therapy (PT), Occupational Therapy (OT), 24 hour per day rehabilitation nursing, Therapeutic Recreaction (TR), Neuropsychology, Care Coordinator, Rehabilitation Medicine, Nutrition Services, and Pharmacy Services  Weekly team conferences will be held on Wednesday to discuss your progress.  Your Inpatient Rehabilitation Care Coordinator will talk with you frequently to get your input and to update you on team discussions.  Team conferences with you and your family in attendance may also be held.  Expected length of stay: 5-7 days  Overall anticipated outcome: mod/I level  Depending on your progress and recovery, your program may change. Your Inpatient Rehabilitation Care Coordinator will coordinate services and will keep you informed of any changes. Your Inpatient Rehabilitation Care Coordinator's name and contact numbers are listed  below.  The following services may also be recommended but are not provided by the Inpatient Rehabilitation Center:  Driving Evaluations Home Health Rehabiltiation Services Outpatient Rehabilitation Services Vocational Rehabilitation   Arrangements will be made to provide these services after discharge if needed.  Arrangements include referral to agencies that provide these services.  Your insurance has been verified to be:  [place], [employer] and Medicare part A Your primary doctor is:  Daniel Nones  Pertinent  information will be shared with your doctor and your insurance company.  Inpatient Rehabilitation Care Coordinator:  Dossie Der, [place] [phone] or Luna Glasgow  Information discussed with and copy given to patient by: Lucy Chris, 08/30/2022, 1:47 PM

## 2022-08-30 NOTE — Progress Notes (Signed)
Inpatient Rehabilitation Care Coordinator Assessment and Plan Patient Details  Name: Andrew Harding MRN: [id number] Date of Birth: 01/04/1951  Today's Date: 08/30/2022  Hospital Problems: Principal Problem:   Osteomyelitis of foot, left, acute (HCC) Active Problems:   S/P transmetatarsal amputation of foot, left [facility])  Past Medical History:  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   Tubular adenoma    Vitamin D deficiency    Past Surgical History:  Past Surgical History:  Procedure Laterality Date   AMPUTATION Left 08/19/2022   Procedure: TRANSMETATARSAL AMPUTATION LEFT FOOT WITH IRRIGATION AND DEBRIDEMENT;  Surgeon: Rosetta Posner, DPM;  Location: [facility] ORS;  Service: Podiatry;  Laterality: Left;   AMPUTATION TOE Right 06/01/2015   Procedure: AMPUTATION TOE;  Surgeon: Recardo Evangelist, DPM;  Location: [facility];  Service: Podiatry;  Laterality: Right;   AMPUTATION TOE Left 08/11/2022   Procedure: AMPUTATION TOE 2, 3, 4;  Surgeon: [name], DPM;  Location: [facility] OR[facility];  Service: Podiatry;  Laterality: Left;   CATARACT EXTRACTION, BILATERAL     CIRCUMCISION N/A 06/12/2022   Procedure: CIRCUMCISION ADULT;  Surgeon: Vanna Scotland, MD;  Location: [facility] OR[facility];  Service: Urology;  Laterality: N/A;   COLONOSCOPY WITH PROPOFOL N/A 02/14/2016   Procedure: COLONOSCOPY WITH PROPOFOL;  Surgeon: Christena Deem, MD;  Location: [facility] ENDOSCOPY;  Service: Endoscopy;  Laterality: N/A;   COLONOSCOPY WITH PROPOFOL N/A 01/07/2019   Procedure: COLONOSCOPY WITH PROPOFOL;  Surgeon: Christena Deem, MD;  Location: [facility] ENDOSCOPY;   Service: Endoscopy;  Laterality: N/A;   CORONARY ARTERY BYPASS GRAFT N/A 08/2012   EXCISION PARTIAL PHALANX Right 06/01/2015   Procedure: EXCISION PARTIAL PHALANX /  BONE;  Surgeon: Recardo Evangelist, DPM;  Location: [facility] ORS;  Service: Podiatry;  Laterality: Right;   FLEXIBLE SIGMOIDOSCOPY N/A 05/29/2016   Procedure: FLEXIBLE SIGMOIDOSCOPY;  Surgeon: Christena Deem, MD;  Location: [facility] ENDOSCOPY;  Service: Endoscopy;  Laterality: N/A;   INCISION AND DRAINAGE Left 08/23/2022   Procedure: INCISION AND DRAINAGE;  Surgeon: [name], DPM;  Location: [facility] ORS;  Service: Podiatry;  Laterality: Left;   KNEE ARTHROSCOPY Left    LOWER EXTREMITY ANGIOGRAPHY Left 08/22/2022   Procedure: Lower Extremity Angiography;  Surgeon: Renford Dills, MD;  Location: [facility] INVASIVE CV LAB;  Service: Cardiovascular;  Laterality: Left;   Social History:  reports that he has been smoking cigars. He has never used smokeless tobacco. He reports that he does not drink alcohol and does not use drugs.  Family / Support Systems Marital Status: Married Patient Roles: Spouse, Programme researcher, broadcasting/film/video, Other (Comment) (employee) Spouse/Significant Other: [name] [phone] do not live together Other Supports: Girlfreind who lives 20 minutes away and has own healtj issues Anticipated Caregiver: Self Ability/Limitations of Caregiver: Pt will need to be mod/i due to going home alone and will have intermittent assist Caregiver Availability: Intermittent Family Dynamics: Still married but wife lives with daughter and assists with daughter's children. Pt has a girlfriend who lives 20 minutes from but has own health issues and assists with another person's  care. Will have friends check on him at home  Social History Preferred language: English Religion: Christian Cultural Background: No issues Education: Naval architect - How often do you need to have someone help you when you read instructions, pamphlets, or other written  material from your doctor or pharmacy?: Never Writes: Yes Employment Status: Employed Name of Employer: [employer] Return to Work Plans: Depends if he can Marine scientist Issues: No issues Guardian/Conservator: None-according to MD pt is capable of making his own decisions while here   Abuse/Neglect Abuse/Neglect Assessment Can Be Completed: Yes Physical Abuse: Denies Verbal Abuse: Denies Sexual Abuse: Denies Exploitation of patient/patient's resources: Denies Self-Neglect: Denies  Patient response to: Social Isolation - How often do you feel lonely or isolated from those around you?: Never  Emotional Status Pt's affect, behavior and adjustment status: Pt is motivated to do well and recover from this surgery. he feels he has been through the mill with multiple surgeries in a short period of time. He has always been very independent and active was still working prior to admission Recent Psychosocial Issues: health issues-was managing until this happen Psychiatric History: No history would benefit from seeing neuro-psych while here due to issues but will be short length of stay so may not be seen while here Substance Abuse History: No issues  Patient / Family Perceptions, Expectations & Goals Pt/Family understanding of illness & functional limitations: Pt is able to explain his multiple surgeries and is hopeful he will be able to begin healing without further surgery. He feels he is in the right place and feels he will get a good head start in his recovery. He does talk with the MD and feels his questions and concerns are being addressed. Premorbid pt/family roles/activities: husband, friend, partner, neighbor, etc Anticipated changes in roles/activities/participation: resume Pt/family expectations/goals: Pt states: " I have to be independent I am goin home alone from here."  [employer]: None Premorbid Home Care/DME Agencies: Other (Comment)  (wc, cane, rw, quad cane) Transportation available at discharge: self unsure if will need to rely upon friends now Is the patient able to respond to transportation needs?: Yes In the past 12 months, has lack of transportation kept you from medical appointments or from getting medications?: No In the past 12 months, has lack of transportation kept you from meetings, work, or from getting things needed for daily living?: No Resource referrals recommended: Neuropsychology  Discharge Planning Living Arrangements: Alone Support Systems: Friends/neighbors Type of Residence: Private residence Insurance Resources: Media planner (specify) (Cigna, BCBS, Medicare part A) Financial Resources: Employment Financial Screen Referred: No Living Expenses: Own Money Management: Patient Does the patient have any problems obtaining your medications?: No Home Management: self Patient/Family Preliminary Plans: Return home alone and have friends come by to check on him, he will need to be mod/i to be safe home alone. Aware team evaluating and setting goals for stay here Care Coordinator Barriers to Discharge: Lack of/limited family support, Decreased caregiver support, Weight bearing restrictions, IV antibiotics Care Coordinator Anticipated Follow Up Needs: HH/OP  Clinical Impression Pleasant gentleman who is motivated to recover and remaining independent. He will need to be due to he is going home alone. Will have friends check on him. Will need to set up home health for IV antibiotics and PT follow up.   Lucy Chris 08/30/2022, 2:01 PM

## 2022-08-30 NOTE — Progress Notes (Signed)
Occupational Therapy Session Note  Patient Details  Name: Andrew Harding MRN: [id number] Date of Birth: July 24, 1950  Today's Date: 08/30/2022 OT Individual Time: 7829-5621 OT Individual Time Calculation (min): 58 min    Short Term Goals: Week 1:  OT Short Term Goal 1 (Week 1): STG= LTG d/t ELOS  Skilled Therapeutic Interventions/Progress Updates:    Pt received sitting up with no c/o pain, agreeable to OT session. Checked in with case manager RN on pt showering with PICC- covered with approved seal skin. Pt very excited to take shower. Skilled monitoring of vitals throughout session to ensure hemodynamic and cardiorespiratory stability. BP stable throughout with sitting, functional mobility to the bathroom and after shower. He had no symptoms of OH. He required mod cueing/instruction for safely completing doffing of clothes, shower, and donning with adherence to weightbearing precautions and to conserve energy. He completed functional mobility into the shower with CGA using the RW. Occluded LLE for shower as well. He completed shower at (S) level overall. He returned to the w/c with (S). He donned a shirt with (S), pants with CGA. He was taken via w/c to the tub room for discussion re DME needs and transfer method. Pt deciding between TTB vs shower chair. Will practice another session. Pt returned to his room and was left supine up with all needs met.   Orthostatic: sitting: 108/67. Standing: 96/56     Therapy Documentation Precautions:  Precautions Precautions: Fall Precaution Comments: LLE NWB Restrictions Weight Bearing Restrictions: Yes LLE Weight Bearing: Non weight bearing Other Position/Activity Restrictions: post-op shoe   Therapy/Group: Individual Therapy  Crissie Reese 08/30/2022, 2:11 PM
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
## 2022-08-30 NOTE — Discharge Instructions (Addendum)
Inpatient Rehab Discharge Instructions  Andrew Harding Discharge date and time:  09/05/2022  Activities/Precautions/ Functional Status: Activity: no lifting, driving, or strenuous exercise till cleared by MD Diet: cardiac diet and diabetic diet Wound Care: Change dressing to left foot twice daily.  Remove dressing and gently cleanse distal incision with saline.  Paint incision with Betadine and cover with bulky sterile gauze dressing. Change daily. Contact Dr. Ether Griffins for concerns.   Functional status:  ___ No restrictions     ___ Walk up steps independently ___ 24/7 supervision/assistance   ___ Walk up steps with assistance _X__ Intermittent supervision/assistance  ___ Bathe/dress independently ___ Walk with walker     ___ Bathe/dress with assistance ___ Walk Independently    ___ Shower independently ___ Walk with assistance    ___ Shower with assistance _X__ No alcohol     ___ Return to work/school ________   Special Instructions: Absolutely no weight on left foot. Wear off loading shoe when in bed.  Protein snacks 1-2 times a day between meals.  3.  Check blood sugars before meals and at bedtime--use your sliding scale and follow up with endocrinology for input.  4.  Have to drink at least 8 glasses of water daily to flush your kidneys.    SLIDING SCALE INSULIN- Use NOVOLOG insulin for below ranges. Use with meals if your blood sugar is over 100 and you plan on eating at least 50% of your meal and are not planning on skipping your meal.  For Blood sugar 120- 150--use 2 units For Blood sugar 151- 200 -->use 4 units For Blood sugar 201- 250-->use 6 units For Blood sugar 251- 300--> use 8 units For Blood sugar 301- 350--> use 10 units. IF BLOOD SUGARS ARE OVER 350 CALL ENDOCRINE.    COMMUNITY REFERRALS UPON DISCHARGE:    Home Health:   PT  & RN                Agency: [facility]    Phone:[phone]   Medical Equipment/Items Ordered:HAS ALL NEEDED EQUIPMENT                                                  Agency/Supplier:NA    My questions have been answered and I understand these instructions. I will adhere to these goals and the provided educational materials after my discharge from the hospital.  Patient/Caregiver Signature _______________________________ Date __________  Clinician Signature _______________________________________ Date __________  Please bring this form and your medication list with you to all your follow-up doctor's appointments.
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
## 2022-08-30 NOTE — Progress Notes (Addendum)
Noted vomiting episode after dinner and prosource administration. Noted undigested food, pro source, and clear mucus.Patient stated he feels ok at this time. PRN regimen implemented. Plan of care in progress.  Tilden Dome, LPN
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
## 2022-08-30 NOTE — Progress Notes (Signed)
Patient ID: Andrew Harding, male   DOB: 10/14/1950, 72 y.o.   MRN: [id number]  Met with pt and a male friend was present pt gave permission to worker to give team conference update. Goals are mod/I level and discharge target is 5/7. He has been away from home for 17 days he wants to go home but wants to make sure he is ready. Have found a home health agency to accept his referral-[facility] to provide [facility] and HHPT services. Pt has DME from past admissions. Will work on discharge needs. He reports it was rough at times for his first day. Will check on tomorrow to see if better.

## 2022-08-30 NOTE — Progress Notes (Signed)
Inpatient Rehabilitation  Patient information reviewed and entered into eRehab system by Aniston Christman M. Arva Slaugh, M.A., CCC/SLP, PPS Coordinator.  Information including medical coding, functional ability and quality indicators will be reviewed and updated through discharge.    

## 2022-08-31 DIAGNOSIS — Z89432 Acquired absence of left foot: Secondary | ICD-10-CM | POA: Diagnosis not present

## 2022-08-31 DIAGNOSIS — I10 Essential (primary) hypertension: Secondary | ICD-10-CM | POA: Diagnosis not present

## 2022-08-31 DIAGNOSIS — R7989 Other specified abnormal findings of blood chemistry: Secondary | ICD-10-CM | POA: Diagnosis not present

## 2022-08-31 DIAGNOSIS — Z794 Long term (current) use of insulin: Secondary | ICD-10-CM

## 2022-08-31 DIAGNOSIS — E1142 Type 2 diabetes mellitus with diabetic polyneuropathy: Secondary | ICD-10-CM

## 2022-08-31 DIAGNOSIS — M86172 Other acute osteomyelitis, left ankle and foot: Secondary | ICD-10-CM | POA: Diagnosis not present

## 2022-08-31 LAB — GLUCOSE, CAPILLARY
Glucose-Capillary: 87 mg/dL (ref 70–99)
Glucose-Capillary: 194 mg/dL — ABNORMAL HIGH (ref 70–99)
Glucose-Capillary: 116 mg/dL — ABNORMAL HIGH (ref 70–99)
Glucose-Capillary: 112 mg/dL — ABNORMAL HIGH (ref 70–99)

## 2022-08-31 NOTE — Progress Notes (Signed)
Physical Therapy Session Note  Patient Details  Name: Andrew Harding MRN: [id number] Date of Birth: 1951/02/25  Today's Date: 08/31/2022 PT Individual Time: 4098-1191, 4782-9562 PT Individual Time Calculation (min): 57 min, 77 min   Short Term Goals: Week 1:  PT Short Term Goal 1 (Week 1): STG=LTG due to ELOS  Skilled Therapeutic Interventions/Progress Updates:      Treatment Session 1   Pt supine in bed upon arrival with IV on R UE.  Pt agreeable to therapy. Pt denies any pain.   Pt performed sit to stand and stand pivot transfer with RW with CGA, verbal cuing provided for UE positioning for safety/controlled descent.   Pt transported dependent in gym for energy and time conservation. Pt ambulated 35 feet with RW and CGA and hop to gait, pt demos good carry over of NWB precautions with therapist managing IV pole.  Pt ascended/descended 8 3 inch steps with B HR with hop to gait with therapist managing IV pole. Pt required seated rest break at top of stairs, verbal cues provided for safety with turn for IV navigation.   Pt performed stand pivot transfer with RW to car simulator (at height of SUV) with CGA, and verbal cuing for technique for safety with therapist navigating IV pole.   Pt seated in WC at end of session with seatbelt alarm on and all needs within reach.   Treatment Session 2   Pt supine in bed upon arrival. Pt agreeable to therapy. Pt denies any pain but reports fatigue. Pt able to self maintain precautions throughout session with intermittent cuing needed.   Pt performed supine to sit with supervision for IV. Pt preformed sit to stand and stand pivot transfer with RW bed <>WC with supervision. Pt demos improved recall of UE positioning with sit<>stand and stand<>sit.   Pt ambulated up/down ramp with RW and CGA for stability.   Pt ambulated x 5 feet in apartment and performed ambulatory transfer to recliner chair as pt sits in his lazyboy recliner frequently at home. Pt  performed with CGA for stability. Pt demos good recall of reaching with UE to chair to control descent.   Pt performed the following activities to improve static and dynamic standing balance: raising R arm off of walker x10 with CGA, raising L arm off of walker x10 with CGA, raising B arms simultaneously with min A for steadying, standing on firm surface while using R UE to place rings 1x10 on cone, standing on firm surface and using L UE to take 1x10 rings off of cone. Pt performed same ring activity while standing on airex pad with CGA.   Pt performed heel raises with B UE support on RW, pt demos tendency to bend knee to raise heel versus keeping knee straight, verbal cues and demo provided to keep knee straight.   Therapist raised height of RW to allow pt to allow improved LE clearance.   Pt ambulated x20 feet with RW and hop to gait with CGA for stability with fatigue, with therapist navigating IV.   Pt performed 1x10 L hip abduction, and L hip extension with B UE support on // bars and using mirror as visual cue and therapist providing tactile cue for upright posture.   Pt transported dependent in WC. Pt supine in bed with HOB elevated at end of session with bed alarm on and needs within reach.     Therapy Documentation Precautions:  Precautions Precautions: Fall Precaution Comments: dizziness upon standing Restrictions Weight Bearing  Restrictions: Yes LLE Weight Bearing: Non weight bearing Other Position/Activity Restrictions: post-op shoe OOB    Therapy/Group: Individual Therapy  [facility], [place], DPT  08/31/2022, 7:52 AM

## 2022-08-31 NOTE — Progress Notes (Signed)
Dr. Farrell Ours aware of  drainage noted/ maceration to incision. POC in progress. Wound care performed per MD order. See flowsheet for details.

## 2022-08-31 NOTE — Progress Notes (Signed)
Patient Picc line was sluggish with no blood return, IV consulted and cathflo was ordered on previous shift. IV nurse inserted cathflo at 2100, rechecked line at 2300, still no blood return, cathflo was reinserted. Nurse returned at 0230  blood return noted however still sluggish. IV nurse gave the okay to now use the line. Primary nurse Unable to administer 2200 dose of IV antibiotic.

## 2022-08-31 NOTE — Progress Notes (Signed)
Occupational Therapy Session Note  Patient Details  Name: Andrew Harding MRN: [id number] Date of Birth: 03-26-1951  Today's Date: 08/31/2022 OT Individual Time: 4401-0272 OT Individual Time Calculation (min): 71 min    Short Term Goals: Week 1:  OT Short Term Goal 1 (Week 1): STG= LTG d/t ELOS  Skilled Therapeutic Interventions/Progress Updates:     Pt received in w/c with no pain.  ADL: Pt completes seated grooming with set up in w/c with cuing for management of w/c parts to get closer to sink with ELR out of the way.   Therapeutic activity Pt completes bean bag toss in standing position with RW AD for balance and CGA A overall. Activity performed to improve dynamic balance and functional reach in min ranges outside BOS in prep for BADLs/IADLs. Pt retrieves items from floor with reacher in standing with CGA  Functional mobilty x40 feet with RW with CGA and cuing for tall posture and proximity to RW for improved hopping.  Pt completes kitchen search into various height cabinets/appliaces in prep for IADL retraining from w/c and sit to stand with countertop level with min cuing for safety/positioning throughout environment. Pt uses walker tray AE to improve reach/safety and transportation of items with education on energy conservation techniques throughout activity  Edu re walker tray for gathering/transporting items for IADL retraining in home.   Pt left at end of session in bed with exit alarm on, call light in reach and all needs met   Therapy Documentation Precautions:  Precautions Precautions: Fall Precaution Comments: dizziness upon standing Restrictions Weight Bearing Restrictions: Yes LLE Weight Bearing: Non weight bearing Other Position/Activity Restrictions: post-op shoe OOB General:   Therapy/Group: Individual Therapy  Shon Hale 08/31/2022, 6:52 AM
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
## 2022-08-31 NOTE — Progress Notes (Addendum)
Notified Delle Reining, [place] of drainage noted/ maceration to incision. POC in progress. Wound care performed per MD order. See flowsheet for details.   Tilden Dome, LPN

## 2022-09-01 DIAGNOSIS — M86172 Other acute osteomyelitis, left ankle and foot: Secondary | ICD-10-CM | POA: Diagnosis not present

## 2022-09-01 DIAGNOSIS — E1142 Type 2 diabetes mellitus with diabetic polyneuropathy: Secondary | ICD-10-CM | POA: Diagnosis not present

## 2022-09-01 DIAGNOSIS — I10 Essential (primary) hypertension: Secondary | ICD-10-CM | POA: Diagnosis not present

## 2022-09-01 DIAGNOSIS — Z89432 Acquired absence of left foot: Secondary | ICD-10-CM | POA: Diagnosis not present

## 2022-09-01 LAB — GLUCOSE, CAPILLARY
Glucose-Capillary: 70 mg/dL (ref 70–99)
Glucose-Capillary: 110 mg/dL — ABNORMAL HIGH (ref 70–99)
Glucose-Capillary: 129 mg/dL — ABNORMAL HIGH (ref 70–99)
Glucose-Capillary: 116 mg/dL — ABNORMAL HIGH (ref 70–99)

## 2022-09-01 MED ORDER — INSULIN GLARGINE-YFGN 100 UNIT/ML ~~LOC~~ SOLN
44.0000 [IU] | Freq: Every day | SUBCUTANEOUS | Status: DC
  Administered 2022-09-01: 44 [IU] via SUBCUTANEOUS
  Filled 2022-09-01 (×2): qty 0.44

## 2022-09-01 NOTE — Progress Notes (Signed)
Physical Therapy Session Note  Patient Details  Name: Andrew Harding MRN: [id number] Date of Birth: 1950/10/01  Today's Date: 09/01/2022 PT Individual Time: 4098-1191 PT Individual Time Calculation (min): 70 min   Short Term Goals: Week 1:  PT Short Term Goal 1 (Week 1): STG=LTG due to ELOS  Skilled Therapeutic Interventions/Progress Updates:   Received pt semi-reclined in bed, pt agreeable to PT treatment, and denied any pain during session. Pt concerned about MD inspecting foot - MD notified and arrived to unwrap and inspect L foot. Noted small while areas of skin maceration, but less drainage than yesterday - RN arrived and MD took pictures of foot. Therapist rewrapped L foot with abdominal pad, gauze, and ace wrap and donned L Darco shoe with max A. Pt transferred semi-reclined<>sitting EOB with HOB elevated mod I and donned R shoe with set up assist. Pt transferred bed<>WC stand<>pivot with RW and CGA/close supervision and performed WC mobility 161ft using BUE and supervision to main therapy gym - emphasis on UE strength/endurance.   Stood from [facility] with RW and close supervision and ambulated 87ft with RW and CGA overall with multiple standing rest breaks - pt with 1 LOB when turning, requiring min A to correct. Pt required cues to slow pace and for increased R foot clearance. Transferred to/from mat via stand<>pivot with RW and CGA and RN arrived to administer medications. Worked on blocked practice sit<>stands on Airex 2x5 reps to fatigue with CGA and emphasis on quad strength. Stood x 3 additional trials on Airex with RW and CGA and worked on dynamic standing balance reaching outside BOS clipping clothespins to basketball net x 3 trials for 1 minute each with CGA for balance. Pt required multiple rest breaks, breathing heavily, and easily fatigued throughout task. Transitioned to standing toe taps to 3 cones 2x5 using LLE with CGA for balance. Transported back to room in [facility] dependently and requested  to return to bed. Transferred WC<>bed stand<>pivot with RW and CGA and sit<>supine mod I. Concluded session with pt semi-reclined in bed, needs within reach, and bed alarm on. Of note, pt demonstrating good adherence to LLE NWB precautions throughout session.   Therapy Documentation Precautions:  Precautions Precautions: Fall Precaution Comments: dizziness upon standing Restrictions Weight Bearing Restrictions: Yes LLE Weight Bearing: Non weight bearing Other Position/Activity Restrictions: post-op shoe OOB  Therapy/Group: Individual Therapy Henderson Halili PT, DPT  09/01/2022, 6:53 AM

## 2022-09-01 NOTE — Progress Notes (Signed)
Occupational Therapy Session Note  Patient Details  Name: RUSHI ZINGG MRN: [id number] Date of Birth: [birth year]  Session 1  Today's Date: 09/01/2022 OT Individual Time: 9528-4132 OT Individual Time Calculation (min): 74 min    Session 2  Today's Date: 09/01/2022 OT Individual Time: 1402-1501 OT Individual Time Calculation (min): 59 min    Short Term Goals: Week 1:  OT Short Term Goal 1 (Week 1): STG= LTG d/t ELOS  Skilled Therapeutic Interventions/Progress Updates:    Session 1 Pt received supine with no c/o pain, agreeable to OT session. He requested to shave. Edu provided on need to shave very carefully while on blood thinner. He was able to thoroughly shave with no nicks. Oral care with set up assist. He requested to take a shower. Discussed home accessibility and bathing. He completed functional mobility into the bathroom with great adherence to LLE NWB with (S) using the RW. He doffed all clothing with (S). Occluded LLE and PICC line with seal skin. He completed bathing seated with set up assist. He returned to the w/c with (S) using the RW. All dressing at (S) level. Min cueing for sequencing when to don offloading shoe. BP intermittently assessed and stable. Pt agreeable to complete laundry to practice for home use. Discussed fall risk reduction strategies and energy conservation. He completed 50 ft of w/c propulsion before fatiguing and requesting a break. Pt completed the BUE ergometer to challenge BUE strength and endurance needed to complete ADLs and IADLs with the highest level of independence. Pt completed 5 min forward and 5 minute backward with cueing for pacing and technique. He returned to his room and was left sitting up with all needs met.     Session 2 Pt received sitting with no c/o pain, agreeable to OT session. He completed w/c propulsion to the laundry room where he unloaded laundry and OT discussed strategies to transport items at home. He was taken via w/c to the  therapy gym following. Showed pt walker tray options on [employer]. He completed functional mobility to the mat, about 10 ft, with (S) initially, however had posterior LOB d/t turning too fast at the end. He completed 3x8 single leg sit <> stands to challenge UE reliance during ADL transfers. Min A- CGA required. He then transitioned into supine and completed 3x12 glute bridges for increasing upright posture. He then completed standing level dynamic balance activity- reaching posteriorly and then forward to challenge dynamic balance. CGA required during more dynamic balance demands. 3x trials of about 2 minutes standing. Discussed kitchen tasks and energy conservation strategies. He returned to his room and was left supine with all needs met.     Therapy Documentation Precautions:  Precautions Precautions: Fall Precaution Comments: dizziness upon standing Restrictions Weight Bearing Restrictions: Yes LLE Weight Bearing: Non weight bearing Other Position/Activity Restrictions: post-op shoe OOB  Therapy/Group: Individual Therapy  Crissie Reese 09/01/2022, 6:45 AM
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
## 2022-09-02 ENCOUNTER — Inpatient Hospital Stay (HOSPITAL_COMMUNITY): Payer: Managed Care, Other (non HMO)

## 2022-09-02 ENCOUNTER — Encounter (HOSPITAL_COMMUNITY): Payer: BC Managed Care – PPO

## 2022-09-02 DIAGNOSIS — E1142 Type 2 diabetes mellitus with diabetic polyneuropathy: Secondary | ICD-10-CM | POA: Diagnosis not present

## 2022-09-02 DIAGNOSIS — T148XXA Other injury of unspecified body region, initial encounter: Secondary | ICD-10-CM | POA: Diagnosis not present

## 2022-09-02 DIAGNOSIS — M86172 Other acute osteomyelitis, left ankle and foot: Secondary | ICD-10-CM | POA: Diagnosis not present

## 2022-09-02 DIAGNOSIS — I10 Essential (primary) hypertension: Secondary | ICD-10-CM | POA: Diagnosis not present

## 2022-09-02 DIAGNOSIS — Z89432 Acquired absence of left foot: Secondary | ICD-10-CM | POA: Diagnosis not present

## 2022-09-02 LAB — CBC WITH DIFFERENTIAL/PLATELET
Abs Immature Granulocytes: 0.04 10*3/uL (ref 0.00–0.07)
Basophils Absolute: 0 10*3/uL (ref 0.0–0.1)
Basophils Relative: 0 %
Eosinophils Absolute: 0.3 10*3/uL (ref 0.0–0.5)
Eosinophils Relative: 3 %
HCT: 31.1 % — ABNORMAL LOW (ref 39.0–52.0)
Hemoglobin: 10.3 g/dL — ABNORMAL LOW (ref 13.0–17.0)
Immature Granulocytes: 0 %
Lymphocytes Relative: 13 %
Lymphs Abs: 1.2 10*3/uL (ref 0.7–4.0)
MCH: 30.8 pg (ref 26.0–34.0)
MCHC: 33.1 g/dL (ref 30.0–36.0)
MCV: 93.1 fL (ref 80.0–100.0)
Monocytes Absolute: 0.5 10*3/uL (ref 0.1–1.0)
Monocytes Relative: 6 %
Neutro Abs: 6.9 10*3/uL (ref 1.7–7.7)
Neutrophils Relative %: 78 %
Platelets: 369 10*3/uL (ref 150–400)
RBC: 3.34 MIL/uL — ABNORMAL LOW (ref 4.22–5.81)
RDW: 13.2 % (ref 11.5–15.5)
WBC: 8.9 10*3/uL (ref 4.0–10.5)
nRBC: 0 % (ref 0.0–0.2)

## 2022-09-02 LAB — GLUCOSE, CAPILLARY
Glucose-Capillary: 107 mg/dL — ABNORMAL HIGH (ref 70–99)
Glucose-Capillary: 63 mg/dL — ABNORMAL LOW (ref 70–99)
Glucose-Capillary: 129 mg/dL — ABNORMAL HIGH (ref 70–99)
Glucose-Capillary: 167 mg/dL — ABNORMAL HIGH (ref 70–99)
Glucose-Capillary: 142 mg/dL — ABNORMAL HIGH (ref 70–99)

## 2022-09-02 MED ORDER — INSULIN GLARGINE-YFGN 100 UNIT/ML ~~LOC~~ SOLN
38.0000 [IU] | Freq: Every day | SUBCUTANEOUS | Status: DC
  Administered 2022-09-02: 38 [IU] via SUBCUTANEOUS
  Filled 2022-09-02 (×2): qty 0.38

## 2022-09-02 NOTE — Progress Notes (Signed)
Hypoglycemic Event  CBG: 63  Treatment: 4 oz of orange juice and breakfast tray  Symptoms: None  Follow-up CBG: Time:06:45 CBG Result:  Possible Reasons for Event: Unknown  Comments/MD notified:Charge nurse notified    Andrew Harding

## 2022-09-02 NOTE — Progress Notes (Signed)
Occupational Therapy Session Note  Patient Details  Name: Andrew Harding MRN: [id number] Date of Birth: 1950/12/10  Today's Date: 09/02/2022 OT Individual Time: 0900-1000 OT Individual Time Calculation (min): 60 min    Short Term Goals: Week 1:  OT Short Term Goal 1 (Week 1): STG= LTG d/t ELOS  Skilled Therapeutic Interventions/Progress Updates:    The pt was in bed at the time of arrival and indicated that he rested ok with no pain indicated at the time of treatment. The pt was in agreement with working on a BADL related task at sink LOF with safety during functional transfers while adhering to NWB as a key component. The pt was able to come from supine to EOB with close S, he was able to donn his orthotic shoe for his L foot and his regular shoe for his R foot with close S.  The pt was able to come from EOB using the RW for transferring to the w/c with CGA and additional time for managing equipment. The pt was able to wash his face, brush his teeth, and comb his hair with s/u assist.  The pt was able to ambulate to the restroom using the RW for additional balance with MinA for managing the pole for intravenous  medication.  The pt was able to doff his LB garments,  underwear and pants ,with close S. The pt lowered himself onto the commode using the grab bar for additional balance.  The pt was able to complete hygiene with close S and donn his LB garments at the same with close S.    The pt went on to complete UB exercise using a 3lb dumb bell for bicep curls, shld flexion, and horizontal abduction.  The pt used a 1lb dowel to complete shld rotation, and large circles 2 sets of 10 with rest breaks as needed, the pt required  a total of 3 rest breaks.  The pt ended the session using the dowel rod for a resistive exercise while maintaining the position of the dowel  in shld flexion while  external force  was exerted on the dowel 4x for a count of 10 with rest breaks as needed, the pt required 1 rest break  with each set.   Therapy Documentation Precautions:  Precautions Precautions: Fall Precaution Comments: dizziness upon standing Restrictions Weight Bearing Restrictions: Yes LLE Weight Bearing: Non weight bearing Other Position/Activity Restrictions: post-op shoe OOB  Therapy/Group: Individual Therapy  Lavona Mound 09/02/2022, 12:50 PM
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
## 2022-09-02 NOTE — Progress Notes (Signed)
Physical Therapy Session Note  Patient Details  Name: Andrew Harding MRN: [id number] Date of Birth: 12/03/1950  Today's Date: 09/02/2022 PT Individual Time: 1305-1402 PT Individual Time Calculation (min): 57 min   Short Term Goals: Week 1:  PT Short Term Goal 1 (Week 1): STG=LTG due to ELOS  Skilled Therapeutic Interventions/Progress Updates:    Pt received supine in bed awake and agreeable to therapy session. Discussed D/C with current plan to D/C on Tue 5/7 with pt reporting he will arrange PRN support from friends/family because him and his wife do not live together. Pt confirms he already has a RW, therapist educated pt that he can rent a wheelchair, if needed, for up to 12 months - pt states he is interested in this - notified primary PT to follow-up prior to D/C. Pt reports he currently only has B HRs on front entrance stairs, but states this weekend some men from his church are installing B HRs on his garage stairs.  Supine>sitting L EOB, HOB partially elevated and using bedrail, mod-I. Sitting EOB donned shoes and L LE DARCO set-up assist - therapist reminds pt of L LE NWBing precautions.  Sit>stand EOB>RW with CGA for safety. Gait training in/out bathroom using RW with CGA for safety - pt sometimes moves quickly but difficult to determine if safe or not because no significant LOB. Sit>stand RW>toilet using grab bar with SBA - therapist requested pt to call before standing, but then he stood before notifying therapist. Continent of bladder.  Engaged pt in managing w/c parts including locking/unlocking brakes this session, but did not initiate education on legrest management. B UE w/c mobility ~177ft towards main therapy gym with supervision and pt demos slow but steady speed. Transported remainder of distance for energy conservation.  Gait training 22ft using RW with CGA for steadying/safety and pt requiring 1x standing rest break. Pt demonstrating the following gait deviations with therapist  providing the described cuing and facilitation for improvement:  - pt with adequate R LE foot clearance for hop-to technique but with fatigue starts to push AD further and further forward outside his BOS placing him at increased risk for instability - educated pt on this    Stair navigation training ascending/descending 4 steps x3 reps (6" height) using B HRs with min assist for lifting on ascent and CGA for steadying/safety during descent - pt barely clears toes to place R foot up onto 1st step on 1st try due to pt underestimating height of step - pt also noted to try and push up with his forearms on the handrails instead of his hands, education on proper technique - on 3rd try, therapist provided visual demo and education on keeping his hands on HRs to push-up through and pushing off with R toes to jump up onto the step, pt calls it a "rocker motion" when he plantar flexes and has improved ability to hop R foot up during ascent with improving control.  Transported back to his room and pt left seated in w/c with needs in reach and seat belt alarm on.    Therapy Documentation Precautions:  Precautions Precautions: Fall Precaution Comments: dizziness upon standing Restrictions Weight Bearing Restrictions: Yes LLE Weight Bearing: Non weight bearing Other Position/Activity Restrictions: post-op shoe OOB   Pain: No reports of pain throughout session.   Therapy/Group: Individual Therapy  Ginny Forth , PT, DPT, NCS, CSRS 09/02/2022, 12:18 PM

## 2022-09-02 NOTE — Progress Notes (Addendum)
Wound care performed per MD order. Noted 1 suture on the distal medial aspect where there is maceration tissue coming out. Notified Dr. Natale Lay of all findings. Dr. Natale Lay made face to face visit and  Podiatry made to face visit. New orders placed   Tilden Dome, LPN

## 2022-09-02 NOTE — Progress Notes (Signed)
Updated CBG 107. No further concerns, call bell in reach.

## 2022-09-03 DIAGNOSIS — I10 Essential (primary) hypertension: Secondary | ICD-10-CM | POA: Diagnosis not present

## 2022-09-03 DIAGNOSIS — Z89432 Acquired absence of left foot: Secondary | ICD-10-CM | POA: Diagnosis not present

## 2022-09-03 DIAGNOSIS — E1142 Type 2 diabetes mellitus with diabetic polyneuropathy: Secondary | ICD-10-CM | POA: Diagnosis not present

## 2022-09-03 DIAGNOSIS — M86172 Other acute osteomyelitis, left ankle and foot: Secondary | ICD-10-CM | POA: Diagnosis not present

## 2022-09-03 LAB — GLUCOSE, CAPILLARY
Glucose-Capillary: 88 mg/dL (ref 70–99)
Glucose-Capillary: 158 mg/dL — ABNORMAL HIGH (ref 70–99)
Glucose-Capillary: 165 mg/dL — ABNORMAL HIGH (ref 70–99)
Glucose-Capillary: 164 mg/dL — ABNORMAL HIGH (ref 70–99)

## 2022-09-03 MED ORDER — INSULIN GLARGINE-YFGN 100 UNIT/ML ~~LOC~~ SOLN
36.0000 [IU] | Freq: Every day | SUBCUTANEOUS | Status: DC
  Administered 2022-09-03 – 2022-09-04 (×2): 36 [IU] via SUBCUTANEOUS
  Filled 2022-09-03 (×3): qty 0.36

## 2022-09-03 NOTE — Progress Notes (Signed)
Physical Therapy Session Note  Patient Details  Name: Andrew Harding MRN: [id number] Date of Birth: 1951/02/03  Today's Date: 09/03/2022 PT Individual Time: 1000-1050, 1300-1345 PT Individual Time Calculation (min): 50 min, 45 min  Today's Date: 09/03/2022 PT Missed Time: 10 Minutes Missed Time Reason: Nursing care (MD assessing pt residual limb/drainage.)  Short Term Goals: Week 1:  PT Short Term Goal 1 (Week 1): STG=LTG due to ELOS  Skilled Therapeutic Interventions/Progress Updates:     Treatment Session 1   Pt supine in bed upon arrival with no IV today.  Pt agreeable to therapy. Pt denies any pain. Pt reports concerns of wnating to see residual limb. MD present throughout session to evaluate healing of residual limb.   Pt performed supine to sit with supervision. Pt donned post op shoe and R shoe with set up assist.   Pt performed sit <>stand and stand pivot transfer bed<>WC with CGA.   Primary therapist followed up with pt regarding stairs to determine if 3 inch stairs he had previously mentioned were the size he had at home of 6 inch stairs he practiced yesterday. Pt determined he doesn't know and doesn't have anyone that can verify prior to discharge. Therapist discussed will continue with 6 inch steps so pt will be prepared either way.   Followed up with pt regarding wheelchair as pt had previously mentioned he would not use the wheelchair. Pt reports "I don't think a wheelchair is for me." Education provided for purpose of PT recommendation for WC due to fatigue. Pt verbalized understanding, but reports he will utilize short distance ambulation to specific destinations [facility] chair, kitchen table, bathroom, bed room) and take rest breaks as needed for fatigue.   Pt ambulated 6x45 feet with RW with hop to gait, and supervision; pt required seated rest breaks in between.   Pt performed ambulatory transfer to apartment bed, pt performed bed mobility independently.   Pt performed  sit<>stand throughout session with RW and supervision.   Pt locked brakes and donned/doffed leg rests with supervision.   Pt transported dependent in [facility] to room. Pt seated in [facility] with seatbelt alarm on and all needs within reach at end of session.   Treatment Session 2  Pt seated in WC upon arrival. Pt agreeable to therapy. Pt denies any pain.   Pt ascended/descended 3x4 6 inch stairs with B HR and hop to gait pattern. On first trial pt required CGA to min A for R LE clearance with ascending and CGA/sup with descending. Between trials discussed pt needing to slow down and focus on technique, as pt going very fast and not getting adequate clearance 2/2 poor technique. Pt ascended 2nd trial with improved technique and CGA, and 3rd trial with supervision. Pt demos and reports improved control.   Pt performed ambulatory transfer x2 to car with RW and supervision on first trial and mod I on 2nd trial, with verbal cues initially provided for how to fold RW and place next to him in passenger seat.   Pt reports his friend will be picking him up from hospital. Education provided to have friend move the chair pt has in his garage to in front of front entrance steps to allow pt to take a rest break between ambulation from car to steps, and actually ascending steps into house. Pt verbalized understanding.   Education provided to [employer] front steps versus garage step entrance until handrails are installed on garage entrance, as garage entrance does not have handrails and  are not safe for navigation with RW. Pt verablized understanding.   Discussed therapist concern for pt getting walker up the stairs and into the house. Pt has RW at home, discussed possibility of buying another RW to keep one RW at bottom of steps and one for inside. Pt verablizes understanding and agrees with plan.  Pt plans to ask his friend to get an additional RW prior to discharge. Discussed also needing a reacher to pick up objects off of  the floor. Pt plans to order on [facility].  Pt able to recall items mentioned at end of session: reacher, RW, and moving chair to front step entrance, pt made not of all 3 items on pt to do list prior to therapist leaving.   Pt seated in WC with seat belt alarm on and all needs within reach at end of session.    Therapy Documentation Precautions:  Precautions Precautions: Fall Precaution Comments: dizziness upon standing Restrictions Weight Bearing Restrictions: Yes LLE Weight Bearing: Non weight bearing Other Position/Activity Restrictions: post-op shoe OOB  Therapy/Group: Individual Therapy  [facility] Ambrose Finland, [place], DPT  09/03/2022, 7:51 AM

## 2022-09-03 NOTE — Progress Notes (Signed)
PODIATRY PROGRESS NOTE  NAME Andrew Harding MRN [id number] DOB 1950/07/24 DOA 08/29/2022   Reason for consult: No chief complaint on file.    History of present illness: 72 y.o. male admitted status post left transmetatarsal amputation.  States he is feeling well no new concerns this morning.  Vitals:   09/03/22 1255 09/03/22 1952  BP: 94/78 120/66  Pulse: 69 80  Resp: 17 18  Temp: 98.1 F (36.7 C) 98.6 F (37 C)  SpO2: 99% 96%       Latest Ref Rng & Units 09/02/2022    3:17 PM 08/30/2022    3:24 AM 08/29/2022    4:33 AM  CBC  WBC 4.0 - 10.5 K/uL 8.9  8.5  8.5   Hemoglobin 13.0 - 17.0 g/dL 40.9  81.1  9.5   Hematocrit 39.0 - 52.0 % 31.1  31.4  30.2   Platelets 150 - 400 K/uL 369  413  405        Latest Ref Rng & Units 08/30/2022    3:24 AM 08/29/2022    4:33 AM 08/28/2022    5:10 AM  BMP  Glucose 70 - 99 mg/dL 914  782  956   BUN 8 - 23 mg/dL 23  24  23    Creatinine 0.61 - 1.24 mg/dL 2.13  0.86  5.78   Sodium 135 - 145 mmol/L 136  138  137   Potassium 3.5 - 5.1 mmol/L 4.3  4.2  4.1   Chloride 98 - 111 mmol/L 103  103  100   CO2 22 - 32 mmol/L 25  25  27    Calcium 8.9 - 10.3 mg/dL 8.7  8.6  8.5       Physical Exam: General: AAOx3, NAD  Dermatology: Status post left transmetatarsal potation as well as I&D to the plantar foot, ankle.  There is decreased macerated tissue patient states that it looks better today.  I agree that there is demonstrated noted in the bandage on the distal medial aspect of the foot.  There is no fluctuance or crepitation.  No significant cellulitis is present.  Vascular: Foot appears to be warm and perfused  Neurological: Sensation decreased  Musculoskeletal Exam: Status post transmetatarsal potation of the foot    ASSESSMENT/PLAN OF CARE  Status post TMA left foot with delayed healing; PAD  -I reviewed the x-rays with the patient.  No evidence of osteomyelitis.  I am able to appreciate the soft tissue wound present the distal medial  aspect of the foot.  White blood cell count also within normal limits.  I recommend infectious disease consultation to see if the adjustments of antibiotics need to be made.  Also arterial duplex is pending.  I discussed with the patient that subsequent delayed healing and he was more of a circulatory issue potentially.  Will await the results of the arterial duplex and.  Would recommend vascular surgery consultation.  Discussed with the patient unfortunately there if there is nonhealing or infection he is at risk of proximal amputation.  I do not see any acute surgical intervention needed today and will await the results of the duplex as well as monitor clinically.  Podiatry will continue to follow.    Please contact me directly with any questions or concerns.     Ovid Curd, DPM [facility]  Dr. Lesia Sago. Jann Milkovich, DPM    [address]  [place], [place] [address]                Office [phone]  Fax [phone]
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
## 2022-09-03 NOTE — Progress Notes (Addendum)
Occupational Therapy Session Note  Patient Details  Name: Andrew Harding MRN: [id number] Date of Birth: Jun 15, 1950  Today's Date: 09/03/2022 OT Individual Time: 0800-0900 OT Individual Time Calculation (min): 60 min    Short Term Goals: Week 1:  OT Short Term Goal 1 (Week 1): STG= LTG d/t ELOS  Skilled Therapeutic Interventions/Progress Updates:    First session:  Pt semi reclined in bed with nurse taking meds.  Requesting to shower and shave his face. IV team arrived and able to disconnect PICC line. Waterproof barrier applied to left foot and right upper arm over PICC site.  Pt shaved sitting at sink and brushed teeth with setup.  Pt ambulated to bathroom using RW and completed walk in shower to shower bench transfer using grab bars also for balance with supervision.  Pt showered with distant supervision in seated position and dried off with towels needing setup. Pt ambulated back to w/c (at sinkside) using RW with CGA due to mildly wet surface.  Pt donned shirt, underwear, and pants with setup and supervision during sit<>stand needing Vcs to follow NWB precautions LLE.  Pt request back to bed due to fatigue after showering.  Stand pivot using bed rail for balance with supervision.  Sit to supine with mod I.  Call bell in reach, bed alarm on.  Second session:  Pt self propelled 100 feet to dayroom with increased time and distant supervision needed.  Pt completed BUE strengthening including 4 lb free weight scaption, doubled red theraband reverse flys, lat pull downs.  Completed 2 x 12 reps each exercise.  Pt self propelled back to room and completed modified stand pivot using bed rails w/c to EOB with CGA.  Sit to supine with mod I.  Call bell in reach, bed alarm on.  Therapy Documentation Precautions:  Precautions Precautions: Fall Precaution Comments: dizziness upon standing Restrictions Weight Bearing Restrictions: Yes LLE Weight Bearing: Non weight bearing Other Position/Activity  Restrictions: post-op shoe OOB    Therapy/Group: Individual Therapy  Amie Critchley 09/03/2022, 8:23 AM

## 2022-09-04 ENCOUNTER — Inpatient Hospital Stay (HOSPITAL_COMMUNITY): Payer: Managed Care, Other (non HMO)

## 2022-09-04 DIAGNOSIS — T8189XS Other complications of procedures, not elsewhere classified, sequela: Secondary | ICD-10-CM

## 2022-09-04 DIAGNOSIS — R7989 Other specified abnormal findings of blood chemistry: Secondary | ICD-10-CM | POA: Diagnosis not present

## 2022-09-04 DIAGNOSIS — M86172 Other acute osteomyelitis, left ankle and foot: Secondary | ICD-10-CM | POA: Diagnosis not present

## 2022-09-04 DIAGNOSIS — I7090 Unspecified atherosclerosis: Secondary | ICD-10-CM

## 2022-09-04 DIAGNOSIS — T8781 Dehiscence of amputation stump: Secondary | ICD-10-CM

## 2022-09-04 DIAGNOSIS — E119 Type 2 diabetes mellitus without complications: Secondary | ICD-10-CM

## 2022-09-04 LAB — CBC WITH DIFFERENTIAL/PLATELET
Abs Immature Granulocytes: 0.02 10*3/uL (ref 0.00–0.07)
Basophils Absolute: 0 10*3/uL (ref 0.0–0.1)
Basophils Relative: 1 %
Eosinophils Absolute: 0.3 10*3/uL (ref 0.0–0.5)
Eosinophils Relative: 4 %
HCT: 31.4 % — ABNORMAL LOW (ref 39.0–52.0)
Hemoglobin: 9.9 g/dL — ABNORMAL LOW (ref 13.0–17.0)
Immature Granulocytes: 0 %
Lymphocytes Relative: 25 %
Lymphs Abs: 1.8 10*3/uL (ref 0.7–4.0)
MCH: 29.7 pg (ref 26.0–34.0)
MCHC: 31.5 g/dL (ref 30.0–36.0)
MCV: 94.3 fL (ref 80.0–100.0)
Monocytes Absolute: 0.5 10*3/uL (ref 0.1–1.0)
Monocytes Relative: 7 %
Neutro Abs: 4.3 10*3/uL (ref 1.7–7.7)
Neutrophils Relative %: 63 %
Platelets: 358 10*3/uL (ref 150–400)
RBC: 3.33 MIL/uL — ABNORMAL LOW (ref 4.22–5.81)
RDW: 13.2 % (ref 11.5–15.5)
WBC: 7 10*3/uL (ref 4.0–10.5)
nRBC: 0 % (ref 0.0–0.2)

## 2022-09-04 LAB — GLUCOSE, CAPILLARY
Glucose-Capillary: 84 mg/dL (ref 70–99)
Glucose-Capillary: 167 mg/dL — ABNORMAL HIGH (ref 70–99)
Glucose-Capillary: 185 mg/dL — ABNORMAL HIGH (ref 70–99)
Glucose-Capillary: 148 mg/dL — ABNORMAL HIGH (ref 70–99)
Glucose-Capillary: 137 mg/dL — ABNORMAL HIGH (ref 70–99)

## 2022-09-04 LAB — COMPREHENSIVE METABOLIC PANEL
ALT: 38 U/L (ref 0–44)
AST: 39 U/L (ref 15–41)
Albumin: 2.6 g/dL — ABNORMAL LOW (ref 3.5–5.0)
Alkaline Phosphatase: 101 U/L (ref 38–126)
Anion gap: 8 (ref 5–15)
BUN: 42 mg/dL — ABNORMAL HIGH (ref 8–23)
CO2: 25 mmol/L (ref 22–32)
Calcium: 8.9 mg/dL (ref 8.9–10.3)
Chloride: 103 mmol/L (ref 98–111)
Creatinine, Ser: 1.4 mg/dL — ABNORMAL HIGH (ref 0.61–1.24)
GFR, Estimated: 53 mL/min — ABNORMAL LOW (ref >60)
Glucose, Bld: 106 mg/dL — ABNORMAL HIGH (ref 70–99)
Potassium: 3.9 mmol/L (ref 3.5–5.1)
Sodium: 136 mmol/L (ref 135–145)
Total Bilirubin: 0.7 mg/dL (ref 0.3–1.2)
Total Protein: 7 g/dL (ref 6.5–8.1)

## 2022-09-04 LAB — VAS US ABI WITH/WO TBI

## 2022-09-04 LAB — SEDIMENTATION RATE: Sed Rate: 117 mm/hr — ABNORMAL HIGH (ref 0–16)

## 2022-09-04 LAB — C-REACTIVE PROTEIN: CRP: 0.7 mg/dL (ref <1.0)

## 2022-09-04 MED ORDER — MEROPENEM IV (FOR PTA / DISCHARGE USE ONLY)
1.0000 g | Freq: Three times a day (TID) | INTRAVENOUS | 0 refills | Status: DC
Start: 2022-09-05 — End: 2022-09-05

## 2022-09-04 MED ORDER — SODIUM CHLORIDE 0.45 % IV SOLN: INTRAVENOUS | Status: DC

## 2022-09-04 MED ORDER — APIXABAN 5 MG PO TABS
5.0000 mg | ORAL_TABLET | Freq: Two times a day (BID) | ORAL | Status: DC
  Administered 2022-09-04 – 2022-09-05 (×2): 5 mg via ORAL
  Filled 2022-09-04 (×2): qty 1

## 2022-09-04 NOTE — Progress Notes (Signed)
ABI's have been completed. Preliminary results can be found in CV Proc through chart review.   09/04/22 2:21 PM Olen Cordial RVT

## 2022-09-04 NOTE — Progress Notes (Signed)
Patient ID: Andrew Harding, male   DOB: 1950/11/01, 72 y.o.   MRN: [id number] Anticipate discharge home tomorrow; dressing change supplies for the next week given to patient for discharge. [name]

## 2022-09-04 NOTE — Progress Notes (Signed)
PHARMACY CONSULT NOTE FOR:  OUTPATIENT  PARENTERAL ANTIBIOTIC THERAPY (OPAT)  Indication: DFU/Osteomyelitis Regimen: Meropenem 1 gram IV every 8 hours End date: 09/19/22  IV antibiotic discharge orders are pended. To discharging provider:  please sign these orders via discharge navigator,  Select New Orders & click on the button choice - Manage This Unsigned Work.    Labs - Once weekly:  CBC/D, CMP, ESR and CRP Please leave PICC in place until doctor has seen patient or been notified Fax weekly labs to [phone]   Thank you for allowing [place] to participate in this patients care. Signe Colt, PharmD 09/04/2022 11:38 AM  **Pharmacist phone directory can be found on [email or web address] listed under [facility]**

## 2022-09-04 NOTE — Progress Notes (Addendum)
Patient ID: ALVAREZ PACCIONE, male   DOB: 1950-07-03, 72 y.o.   MRN: [id number]  Education moving forward for IV antibiotics for home and pt reports seen vascular MD. Not sure being discharged tomorrow awaiting MD recommendations.   1;46 PM MD reports pt can be discharged home tomorrow. Pt is aware of this. Still awaiting ID input.  2:27 PM Pt is requesting to see Dr Sherral Hammers prior to his discharge. Will try to message him  3;25 pm Met with pt to discuss his concerns he does not like waiting for MD's to decide what the plan is and what the plan is. He was told it may take 6-8 weeks to heal and he wants to see if it will heal. He does want to see Dr Sherral Hammers and suppose to see ID also. Will await collaboration between MD's regarding recommendations for care of his foot.

## 2022-09-04 NOTE — Progress Notes (Signed)
Patient was able to demonstrate wound dressing change as well as verbalize the steps. Stated he wants more practice with supervision until he's comfortable.   Nurse informed him there will be another opportunity as the dressing change is 2x/daily and he will get another one before discharge as well.

## 2022-09-04 NOTE — Progress Notes (Addendum)
Physical Therapy Session Note  Patient Details  Name: ROCCI SIRCY MRN: [id number] Date of Birth: 11/19/1950  Today's Date: 09/04/2022 PT Individual Time: 1000-1115 PT Individual Time Calculation (min): 75 min   Short Term Goals: Week 1:  PT Short Term Goal 1 (Week 1): STG=LTG due to ELOS  Skilled Therapeutic Interventions/Progress Updates:      Pt seated in WC upon arrival. Pt agreeable to therapy. Pt denies any pain.  Pt performed components of discharge, (see discharge assessment).   Pt transported dependent in [facility] to main gym for time and energy conservation.   Therapist assessing pt vitals throughout session since pt orthostatic during previous OT session.  Pt seated BP 92/65, HR 78, MAP 75; standing BP 74/59, HR 65, MAP 70; pt asymptomatic; post first floor recovery BP 86/72, HR 121, MAP 77; post 2nd floor recovery BP 96/60, HR 70, MAP 71; post first trial ambulation BP 84/61 HR 84, MAP 70, pt reports mild dizziness, pt took seated rest break x5 min, dizziness subsided and BP 110/68.   Pt performed fall recovery x2, initial trial to mat table with supervision for technique and safety,  2nd trial with mod I. Education provided to keep phone with pt at all times. Education provided for fall prevention. Education provided to assess for injury, and if unsafe to perform oneself to call 911 for help getting up. Pt verbalized understanding.   Pt reviewed and performed exercises below, pt requiring CGA for safety due to fatigue with standing exercises. Opted not to provide standing exercises for HEP for safety.   Access Code: [email or web address] URL: [email or web address] Date: 09/04/2022 Prepared by: Ambrose Finland   Exercises - Supine Bridge  - 1 x daily - 7 x weekly - 3 sets - 10 reps - Sidelying Hip Abduction  - 1 x daily - 7 x weekly - 3 sets - 10 reps - Prone Hip Extension  - 1 x daily - 7 x weekly - 3 sets - 10 reps - Supine Active Straight Leg Raise  - 1 x daily - 7 x weekly  - 3 sets - 10 reps - Mini Squat with Counter Support  - 1 x daily - 7 x weekly - 3 sets - 10 reps - Heel Raises with Counter Support  - 1 x daily - 7 x weekly - 3 sets - 10 reps - Standing Hip Abduction with Counter Support  - 1 x daily - 7 x weekly - 3 sets - 10 reps - Standing Hip Extension with Counter Support  - 1 x daily - 7 x weekly - 3 sets - 10 reps - Standing March with Counter Support  - 1 x daily - 7 x weekly - 3 sets - 10 reps  Handout provided to do the following exercises at home. Education provided to keep L LE straight for single leg dead lifts.   Access Code: [email or web address] URL: [email or web address] Date: 09/04/2022 Prepared by: Ambrose Finland  Exercises - Single Leg Bridge  - 1 x daily - 7 x weekly - 3 sets - 10 reps - Prone Hip Extension  - 1 x daily - 7 x weekly - 3 sets - 10 reps - Sidelying Hip Abduction  - 1 x daily - 7 x weekly - 3 sets - 10 reps - Supine Active Straight Leg Raise  - 1 x daily - 7 x weekly - 3 sets - 10 reps - Seated Long Arc Quad  - 1 x daily - 7 x weekly -  3 sets - 10 reps - Seated Ankle Pumps  - 1 x daily - 7 x weekly - 3 sets - 10 reps - Supine Gluteal Sets  - 1 x daily - 7 x weekly - 3 sets - 10 reps - Supine Quad Set  - 1 x daily - 7 x weekly - 3 sets - 10 reps   Pt verbalized understanding.   Pt ambulated with RW 1x50 feet and 2 x30 feet with hop to gait and mod I.   Pt transported dependent in [facility] to room. Pt seated in WC at end of session with needs within reach and seatbelt alarm on.      Therapy Documentation Precautions:  Precautions Precautions: Other (comment), Fall Precaution Comments: LLE NWB Restrictions Weight Bearing Restrictions: Yes LLE Weight Bearing: Non weight bearing Other Position/Activity Restrictions: post-op shoe OOB  Therapy/Group: Individual Therapy  [facility] Ambrose [place], [place], DPT  09/04/2022, 9:48 AM
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
## 2022-09-04 NOTE — Progress Notes (Signed)
Physical Therapy Discharge Summary  Patient Details  Name: Andrew Harding MRN: [id number] Date of Birth: 11/24/1950  Date of Discharge from PT service:Sep 04, 2022   Patient has met 10 of 11 long term goals due to improved activity tolerance, improved balance, increased strength, ability to compensate for deficits, improved awareness, and improved coordination.  Patient to discharge at an ambulatory level Modified Independent.   Patient's care partner is independent to provide the necessary physical assistance at discharge.  Reasons goals not met: pt able to ambulate a maximum of 50 feet with RW and mod I due to fatigue.   Recommendation:  Patient will benefit from ongoing skilled PT services in home health setting to continue to advance safe functional mobility, address ongoing impairments in endurance, gait pattern, strength, ROM, and minimize fall risk.  Equipment: Recommending RW and WC; pt has RW at home, pt refused WC as pt reports he will not use it.   Reasons for discharge: treatment goals met and discharge from hospital  Patient/family agrees with progress made and goals achieved: Yes  PT Discharge Precautions/Restrictions Precautions Precautions: Other (comment);Fall Precaution Comments: LLE NWB Restrictions Weight Bearing Restrictions: Yes LLE Weight Bearing: Non weight bearing Other Position/Activity Restrictions: post-op shoe OOB Pain Interference Pain Interference Pain Effect on Sleep: 1. Rarely or not at all Pain Interference with Therapy Activities: 1. Rarely or not at all Pain Interference with Day-to-Day Activities: 1. Rarely or not at all Vision/Perception  Vision - History Ability to See in Adequate Light: 0 Adequate Perception Perception: Within Functional Limits Praxis Praxis: Intact  Cognition Overall Cognitive Status: Within Functional Limits for tasks assessed Arousal/Alertness: Awake/alert Orientation Level: Oriented X4 Memory: Appears  intact Awareness: Appears intact Problem Solving: Appears intact Safety/Judgment: Appears intact Sensation Sensation Light Touch: Impaired Detail Central sensation comments: LLE neuropathy. Starts having normal sensation about 3 in above heel Light Touch Impaired Details: Absent LLE Hot/Cold: Not tested Proprioception: Appears Intact Stereognosis: Not tested Coordination Gross Motor Movements are Fluid and Coordinated: No Fine Motor Movements are Fluid and Coordinated: Yes Coordination and Movement Description: limited by L LE NWB Motor  Motor Motor: Within Functional Limits  Mobility Bed Mobility Bed Mobility: Sit to Supine;Supine to Sit;Rolling Right;Rolling Left Rolling Right: Independent Rolling Left: Independent Supine to Sit: Independent Sit to Supine: Independent Transfers Transfers: Sit to Stand;Stand to Sit;Stand Pivot Transfers Sit to Stand: Independent with assistive device Stand to Sit: Independent with assistive device Stand Pivot Transfers: Independent with assistive device Transfer (Assistive device): Rolling walker Locomotion  Gait Ambulation: Yes Gait Assistance: Independent with assistive device Gait Distance (Feet): 50 Feet Assistive device: Rolling walker Gait Gait: Yes Gait Pattern: Impaired Gait Pattern:  (hop to gait pattern 2/2 L LE NWBing precautions) Gait velocity: decreased Stairs / Additional Locomotion Stairs: Yes Stairs Assistance: Independent with assistive device Stair Management Technique: Two rails Number of Stairs: 4 Height of Stairs: 6 Pick up small object from the floor assist level: Independent with assistive Paediatric nurse Mobility: Yes Wheelchair Assistance: Doctor, general practice: Both upper extremities Wheelchair Parts Management: Independent Distance: 150  Trunk/Postural Assessment  Cervical Assessment Cervical Assessment: Within Functional Limits Thoracic  Assessment Thoracic Assessment: Within Functional Limits Lumbar Assessment Lumbar Assessment: Within Functional Limits Postural Control Postural Control: Within Functional Limits  Balance Balance Balance Assessed: Yes Static Sitting Balance Static Sitting - Balance Support: Feet supported Static Sitting - Level of Assistance: 7: Independent Dynamic Sitting Balance Dynamic Sitting - Balance Support: Feet supported Dynamic Sitting -  Level of Assistance: 7: Independent Static Standing Balance Static Standing - Balance Support: During functional activity;Bilateral upper extremity supported Static Standing - Level of Assistance: 6: Modified independent (Device/Increase time) Dynamic Standing Balance Dynamic Standing - Balance Support: During functional activity;Bilateral upper extremity supported Dynamic Standing - Level of Assistance: 6: Modified independent (Device/Increase time) Extremity Assessment  R LE WFL L LE grossly at least 3/3  [employer], [place], DPT  09/04/2022, 4:16 PM

## 2022-09-04 NOTE — Progress Notes (Signed)
Occupational Therapy Discharge Summary  Patient Details  Name: Andrew Harding MRN: [id number] Date of Birth: [birth year]  Date of Discharge from OT service:Sep 04, 2022  Patient has met 6 of 6 long term goals due to improved activity tolerance, improved balance, postural control, ability to compensate for deficits, and improved coordination.  Patient to discharge at overall Modified Independent level.  Patient's care partner is independent to provide the necessary physical assistance at discharge- intermittent supervision. [name] has made excellent progress in CIR and is at a mod I level overall with simple meal prep and ADLs overall. Recommend use of a shower chair that pt will get on his own per his request.   Recommendation:  No further OT f/u indicated at this time   Equipment: No equipment provided  Reasons for discharge: treatment goals met and discharge from hospital  Patient/family agrees with progress made and goals achieved: Yes  OT Discharge Precautions/Restrictions  Precautions Precautions: Other (comment);Fall Precaution Comments: LLE NWB Restrictions Weight Bearing Restrictions: Yes LLE Weight Bearing: Non weight bearing Other Position/Activity Restrictions: post-op shoe OOB   ADL ADL Eating: Independent Where Assessed-Eating: Bed level Grooming: Modified independent Where Assessed-Grooming: Sitting at sink Upper Body Bathing: Modified independent Where Assessed-Upper Body Bathing: Sitting at sink Lower Body Bathing: Contact guard Where Assessed-Lower Body Bathing: Sitting at sink Upper Body Dressing: Modified independent (Device) Where Assessed-Upper Body Dressing: Sitting at sink Lower Body Dressing: Modified independent Where Assessed-Lower Body Dressing: Sitting at sink Toileting: Modified independent Where Assessed-Toileting: Teacher, adult education: Engineer, agricultural Method: Risk manager: Modified independent Lexicographer Method: Warden/ranger: Information systems manager with back Vision Baseline Vision/History: 1 Wears glasses Patient Visual Report: No change from baseline Vision Assessment?: No apparent visual deficits Perception  Perception: Within Functional Limits Praxis Praxis: Intact Cognition Cognition Overall Cognitive Status: Within Functional Limits for tasks assessed Arousal/Alertness: Awake/alert Memory: Appears intact Awareness: Appears intact Problem Solving: Appears intact Safety/Judgment: Appears intact Brief Interview for Mental Status (BIMS) Repetition of Three Words (First Attempt): 3 Temporal Orientation: Year: Correct Temporal Orientation: Month: Accurate within 5 days Temporal Orientation: Day: Correct Recall: "Sock": Yes, no cue required Recall: "Blue": Yes, no cue required Recall: "Bed": Yes, no cue required BIMS Summary Score: 15 Sensation Sensation Light Touch: Impaired Detail Central sensation comments: LLE neuropathy. Starts having normal sensation about 3 in above heel Light Touch Impaired Details: Absent LLE Proprioception: Appears Intact Motor  Motor Motor: Within Functional Limits Mobility  Bed Mobility Bed Mobility: Sit to Supine;Supine to Sit Supine to Sit: Independent Sit to Supine: Independent Transfers Sit to Stand: Independent with assistive device Stand to Sit: Independent with assistive device  Trunk/Postural Assessment  Cervical Assessment Cervical Assessment: Within Functional Limits Thoracic Assessment Thoracic Assessment: Within Functional Limits Lumbar Assessment Lumbar Assessment: Within Functional Limits Postural Control Postural Control: Within Functional Limits  Balance Balance Balance Assessed: Yes Static Sitting Balance Static Sitting - Balance Support: Feet supported Static Sitting - Level of Assistance: 7: Independent Dynamic Sitting Balance Dynamic Sitting - Balance Support: Feet  supported Dynamic Sitting - Level of Assistance: 7: Independent Static Standing Balance Static Standing - Balance Support: During functional activity;Bilateral upper extremity supported Static Standing - Level of Assistance: 6: Modified independent (Device/Increase time) Dynamic Standing Balance Dynamic Standing - Balance Support: During functional activity Dynamic Standing - Level of Assistance: 6: Modified independent (Device/Increase time) Extremity/Trunk Assessment RUE Assessment RUE Assessment: Within Functional Limits LUE Assessment LUE Assessment: Within Functional Limits  Crissie Reese 09/04/2022, 9:06 AM

## 2022-09-04 NOTE — Progress Notes (Signed)
Occupational Therapy Session Note  Patient Details  Name: Andrew Harding MRN: [id number] Date of Birth: 15-Feb-1951   Session 1 Today's Date: 09/04/2022 OT Individual Time: 4098-1191 OT Individual Time Calculation (min): 73 min    Session 2 Today's Date: 09/04/2022 OT Individual Time: 1300-1330 OT Individual Time Calculation (min): 30 min  and Today's Date: 09/04/2022 OT Missed Time: 30 Minutes Missed Time Reason: Patient fatigue   Short Term Goals: Week 1:  OT Short Term Goal 1 (Week 1): STG= LTG d/t ELOS  Skilled Therapeutic Interventions/Progress Updates:    Pt received supine with no c/o pain, agreeable to OT session. He had several questions re consults made over the weekend, eventual weightbearing, and effect on d /c home. Discussed all within OT scope and deferred further questions to medical team. He came to EOB with mod I. He donned his offloading shoe with mod I. Skilled monitoring of vitals throughout session to ensure hemodynamic and cardiorespiratory stability. BP checked EOB and it was 120/78. He completed standing level BP- 68/59. He took an extended seated rest break. Discussed home orthostatic management- including maintenance of fluids, addition of compression socks, and importance of dynamic vs static movement. He completed 40 ft of functional mobility at mod I level using the RW- BP immediately following was 82/53. Following a 3 min rest break seated it rose to 104/68. He propelled the w/c to the therapy gym with mod I, slow pace. He completed dynamic balance activity, standing on an airex pad with alternating release of single UE from the RW while maintaining LLE NWB to challenge ankle righting reactions. He then sat and completed BUE strengthening circuit- 3x10 shoulder press and tricep extension- both for carryover to ADL transfers. He returned to his room and was left sitting up with all needs met.    Session 2 Pt received supine in bed, LLE unwrapped. He had several  questions clarifying what vascular PA said during visit- clarified to best of OT ability and within scope. He had questions about a future BKA and what that could look like. Discussed prosthetic journey, what independence with ADLs could like, and usual course of rehab. Pt very appreciative of conversation. Understandably down, saying "the winds are out of my sails right now". Provided emotional support and encouragement to experience emotions of the situation at hand. He declined getting OOB d/t wanting to wait on MD evaluation of foot before re-wrapping (vascular sx vs ID?). He was left supine with all needs within reach. 30 min missed.    Therapy Documentation Precautions:  Precautions Precautions: Fall Precaution Comments: dizziness upon standing Restrictions Weight Bearing Restrictions: Yes LLE Weight Bearing: Non weight bearing Other Position/Activity Restrictions: post-op shoe OOB   Therapy/Group: Individual Therapy  Crissie Reese 09/04/2022, 6:47 AM

## 2022-09-05 ENCOUNTER — Other Ambulatory Visit (HOSPITAL_COMMUNITY): Payer: Self-pay

## 2022-09-05 DIAGNOSIS — M8618 Other acute osteomyelitis, other site: Secondary | ICD-10-CM

## 2022-09-05 DIAGNOSIS — E1169 Type 2 diabetes mellitus with other specified complication: Secondary | ICD-10-CM

## 2022-09-05 DIAGNOSIS — Z89432 Acquired absence of left foot: Secondary | ICD-10-CM | POA: Diagnosis not present

## 2022-09-05 DIAGNOSIS — L089 Local infection of the skin and subcutaneous tissue, unspecified: Secondary | ICD-10-CM

## 2022-09-05 DIAGNOSIS — N179 Acute kidney failure, unspecified: Secondary | ICD-10-CM

## 2022-09-05 DIAGNOSIS — E1122 Type 2 diabetes mellitus with diabetic chronic kidney disease: Secondary | ICD-10-CM

## 2022-09-05 DIAGNOSIS — N1832 Chronic kidney disease, stage 3b: Secondary | ICD-10-CM

## 2022-09-05 DIAGNOSIS — M86172 Other acute osteomyelitis, left ankle and foot: Secondary | ICD-10-CM | POA: Diagnosis not present

## 2022-09-05 DIAGNOSIS — N183 Chronic kidney disease, stage 3 unspecified: Secondary | ICD-10-CM

## 2022-09-05 DIAGNOSIS — I10 Essential (primary) hypertension: Secondary | ICD-10-CM | POA: Diagnosis not present

## 2022-09-05 DIAGNOSIS — E11628 Type 2 diabetes mellitus with other skin complications: Secondary | ICD-10-CM

## 2022-09-05 HISTORY — DX: Acute kidney failure, unspecified: N17.9

## 2022-09-05 LAB — GLUCOSE, CAPILLARY
Glucose-Capillary: 100 mg/dL — ABNORMAL HIGH (ref 70–99)
Glucose-Capillary: 170 mg/dL — ABNORMAL HIGH (ref 70–99)

## 2022-09-05 LAB — BASIC METABOLIC PANEL
Anion gap: 7 (ref 5–15)
BUN: 35 mg/dL — ABNORMAL HIGH (ref 8–23)
CO2: 24 mmol/L (ref 22–32)
Calcium: 8.9 mg/dL (ref 8.9–10.3)
Chloride: 104 mmol/L (ref 98–111)
Creatinine, Ser: 1.25 mg/dL — ABNORMAL HIGH (ref 0.61–1.24)
GFR, Estimated: >60 mL/min (ref >60)
Glucose, Bld: 109 mg/dL — ABNORMAL HIGH (ref 70–99)
Potassium: 3.9 mmol/L (ref 3.5–5.1)
Sodium: 135 mmol/L (ref 135–145)

## 2022-09-05 MED ORDER — METOPROLOL TARTRATE 25 MG PO TABS
25.0000 mg | ORAL_TABLET | Freq: Two times a day (BID) | ORAL | 0 refills | Status: DC
  Filled 2022-09-05: qty 60, 30d supply, fill #0

## 2022-09-05 MED ORDER — ASCORBIC ACID 500 MG PO TABS
500.0000 mg | ORAL_TABLET | Freq: Two times a day (BID) | ORAL | 0 refills | Status: DC
  Filled 2022-09-05: qty 100, 50d supply, fill #0

## 2022-09-05 MED ORDER — NYSTATIN 100000 UNIT/ML MT SUSP
5.0000 mL | Freq: Four times a day (QID) | OROMUCOSAL | 0 refills | Status: DC
  Filled 2022-09-05: qty 60, 3d supply, fill #0

## 2022-09-05 MED ORDER — GABAPENTIN 300 MG PO CAPS
300.0000 mg | ORAL_CAPSULE | Freq: Three times a day (TID) | ORAL | 0 refills | Status: DC
  Filled 2022-09-05: qty 90, 30d supply, fill #0

## 2022-09-05 MED ORDER — NOVOLOG FLEXPEN 100 UNIT/ML ~~LOC~~ SOPN: PEN_INJECTOR | SUBCUTANEOUS | 11 refills | Status: DC

## 2022-09-05 MED ORDER — APIXABAN 5 MG PO TABS
5.0000 mg | ORAL_TABLET | Freq: Two times a day (BID) | ORAL | 0 refills | Status: DC
  Filled 2022-09-05: qty 60, 30d supply, fill #0

## 2022-09-05 MED ORDER — ZINC SULFATE 220 (50 ZN) MG PO TABS
220.0000 mg | ORAL_TABLET | Freq: Every day | ORAL | 0 refills | Status: DC
  Filled 2022-09-05: qty 100, 100d supply, fill #0

## 2022-09-05 MED ORDER — CLOPIDOGREL BISULFATE 75 MG PO TABS
75.0000 mg | ORAL_TABLET | Freq: Every day | ORAL | 0 refills | Status: DC
  Filled 2022-09-05: qty 30, 30d supply, fill #0

## 2022-09-05 MED ORDER — INSULIN GLARGINE 100 UNIT/ML SOLOSTAR PEN
36.0000 [IU] | PEN_INJECTOR | Freq: Every day | SUBCUTANEOUS | 11 refills | Status: DC
  Filled 2022-09-05: qty 9, 25d supply, fill #0

## 2022-09-05 MED ORDER — JUVEN PO PACK: 1.0000 | PACK | Freq: Two times a day (BID) | ORAL | 0 refills | Status: DC

## 2022-09-05 MED ORDER — DOCUSATE SODIUM 100 MG PO CAPS
100.0000 mg | ORAL_CAPSULE | Freq: Two times a day (BID) | ORAL | 0 refills | Status: DC
  Filled 2022-09-05: qty 100, 50d supply, fill #0

## 2022-09-05 MED ORDER — MEROPENEM IV (FOR PTA / DISCHARGE USE ONLY)
1.0000 g | Freq: Three times a day (TID) | INTRAVENOUS | 0 refills | Status: AC
Start: 2022-09-05 — End: 2022-09-19
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
## 2022-09-05 NOTE — Progress Notes (Signed)
  Subjective:  Patient ID: Andrew Harding, male    DOB: 1950/09/20,  MRN: [id number]  Patient seen at bedside resting comfortably, not having any pain in the foot.  Says it has been a difficult afternoon, he wants his foot to heal and does not want to have foot amputation  Negative for chest pain and shortness of breath Fever: no Night sweats: no Weight loss: no Review of all other systems is negative Objective:   Vitals:   09/04/22 1944 09/05/22 0345  BP: 130/74 113/64  Pulse: (!) 102 70  Resp: 16 18  Temp: 98.3 F (36.8 C) 97.7 F (36.5 C)  SpO2: 98% 97%   General AA&O x3. Normal mood and affect.  Vascular Nonpalpable DP and PT pulse, foot is warm  Neurologic Epicritic sensation grossly absent.  Dermatologic Sutures intact, mild serous drainage, some areas of delayed healing present  Orthopedic: MMT 5/5 in dorsiflexion, plantarflexion, inversion, and eversion. Normal joint ROM without pain or crepitus.    Assessment & Plan:  Patient was evaluated and treated and all questions answered.  L TMA 08/19/22 -Reviewed his progress.  Discussed the presence of the delayed healing.  Currently no impending signs of severe infection.  She is remain well-controlled while on IV antibiotics.  Vascular surgery saw him today and noted that he is optimized from a vascular standpoint at this point I do not recommend further intervention.  I discussed with him the discussion regarding a below-knee amputation is more of a long-term possibility if he ultimately does not heal.  At this point I would recommend continuing with discharge, he may follow-up with his surgeon Dr. Ether Griffins, he has an appointment with him next week in the event it does not heal I did discuss with him that at a certain point balloon amputation would be a better option as opposed to a more proximal amputation.  Edwin Cap, DPM  Accessible via secure chat for questions or concerns.

## 2022-09-05 NOTE — Progress Notes (Signed)
Inpatient Rehabilitation Discharge Medication Review by a Pharmacist  A complete drug regimen review was completed for this patient to identify any potential clinically significant medication issues.  High Risk Drug Classes Is patient taking? Indication by Medication  Antipsychotic No   Anticoagulant Yes Eliquis - Afib  Antibiotic Yes, as an intravenous medication Meropenem for toe necrosis until 09/19/2022 Nystatin - thrush  Opioid No   Antiplatelet Yes Clopidogrel - CAD  Hypoglycemics/insulin Yes Trulicity/Lantus - DM  Vasoactive Medication Yes Metoprolol - HTN  Chemotherapy No   Other Yes Gabapentin - neuropathy Duo-neb prn SOB Trazodone - sleep Vitamin D and C, zinc - supplementation Pravastatin - HLD     Type of Medication Issue Identified Description of Issue Recommendation(s)  Drug Interaction(s) (clinically significant)     Duplicate Therapy     Allergy     No Medication Administration End Date     Incorrect Dose     Additional Drug Therapy Needed     Significant med changes from prior encounter (inform family/care partners about these prior to discharge).     Other       Clinically significant medication issues were identified that warrant physician communication and completion of prescribed/recommended actions by midnight of the next day:  No  Pharmacist comments: None

## 2022-09-05 NOTE — Progress Notes (Signed)
Inpatient Rehabilitation Care Coordinator Discharge Note   Patient Details  Name: QUINTAVIS TRANCHINA MRN: [id number] Date of Birth: Jun 22, 1950   Discharge location: HOME ALONE WITH INTERMITTENT ASSIST FROM FRIENDS AND WIFE AT TIMES  Length of Stay: 7 days  Discharge activity level: MOD/I LEVEL  Home/community participation: ACTIVE  Patient response WU:JWJXBJ Literacy - How often do you need to have someone help you when you read instructions, pamphlets, or other written material from your doctor or pharmacy?: Never  Patient response YN:WGNFAO Isolation - How often do you feel lonely or isolated from those around you?: Never  Services provided included: MD, RD, PT, OT, RN, CM, Pharmacy, Neuropsych, SW  Financial Services:  Field seismologist Utilized: Armed forces operational officer  Choices offered to/list presented to: PT  Follow-up services arranged:  Home Health, Patient/Family has no preference for HH/DME agencies Home Health Agency: [facility]  PT  & RN         Patient response to transportation need: Is the patient able to respond to transportation needs?: Yes In the past 12 months, has lack of transportation kept you from medical appointments or from getting medications?: No In the past 12 months, has lack of transportation kept you from meetings, work, or from getting things needed for daily living?: No   Patient/Family verbalized understanding of follow-up arrangements:  Yes  Individual responsible for coordination of the follow-up plan: SELF [phone]  Confirmed correct DME delivered: Lucy Chris 09/05/2022    Comments (or additional information):PT EDUCATED ON IV ANTIBIOTICS AND WOUND DRESSING. HE IS CONCERNED WANTS TO HEAL AND NOT HAVE ANYMORE AMPUTATION ON HIS FOOT. HE IS AWARE OF THE SLOW HEALING AND FOLLOW UP WITH MD REGULARLY. DISCUSSED HIRED ASSIST FOR WOUND DRESSING WOULD NEED OT BE LPN OR RN AND IT WOULD BE PRIVATE PAY. BEDSIDE RN HAS HAD  DEMONSTRATE WOUND CARE WHILE HERE.  Summary of Stay    Date/Time Discharge Planning CSW  08/30/22 0959 new evaluation-home alone with friends checking on-insurance will be a barrier for home IV antibiotics and follow up therapies RGD       Roby Donaway, Lemar Livings

## 2022-09-05 NOTE — Progress Notes (Signed)
  Progress Note    09/05/2022 7:34 AM * No surgery found *  Subjective:  concerned about managing is PICC line and left foot dressings on his own   Vitals:   09/04/22 1944 09/05/22 0345  BP: 130/74 113/64  Pulse: (!) 102 70  Resp: 16 18  Temp: 98.3 F (36.8 C) 97.7 F (36.5 C)  SpO2: 98% 97%   Physical Exam: Cardiac:  regular Lungs:  non labored Incisions: left foot dressed Extremities:  palpable left femoral and DP pulses Neurologic: alert and oriented   CBC    Component Value Date/Time   WBC 7.0 09/04/2022 0032   RBC 3.33 (L) 09/04/2022 0032   HGB 9.9 (L) 09/04/2022 0032   HCT 31.4 (L) 09/04/2022 0032   PLT 358 09/04/2022 0032   MCV 94.3 09/04/2022 0032   MCH 29.7 09/04/2022 0032   MCHC 31.5 09/04/2022 0032   RDW 13.2 09/04/2022 0032   LYMPHSABS 1.8 09/04/2022 0032   MONOABS 0.5 09/04/2022 0032   EOSABS 0.3 09/04/2022 0032   BASOSABS 0.0 09/04/2022 0032    BMET    Component Value Date/Time   NA 135 09/05/2022 0415   K 3.9 09/05/2022 0415   CL 104 09/05/2022 0415   CO2 24 09/05/2022 0415   GLUCOSE 109 (H) 09/05/2022 0415   GLUCOSE 402 (H) 09/17/2012 1417   BUN 35 (H) 09/05/2022 0415   CREATININE 1.25 (H) 09/05/2022 0415   CALCIUM 8.9 09/05/2022 0415   GFRNONAA >60 09/05/2022 0415   GFRAA 58 (L) 06/02/2015 0656    INR    Component Value Date/Time   INR 1.2 08/17/2022 0944     Intake/Output Summary (Last 24 hours) at 09/05/2022 0734 Last data filed at 09/04/2022 1829 Gross per 24 hour  Intake 2361.5 ml  Output 500 ml  Net 1861.5 ml     Assessment/Plan:  72 y.o. male is s/p left TMA that is not healing well  He is optimized from vascular standpoint Has palpable pulses in LLE Still remains at high risk for poor healing with is microvascular disease. He understands this may lead to more proximal amputation in the future Has PICC line and is scheduled to go home today Dressing changes daily to left foot On Eliquis, Plavix Has followed up  arranged at [facility] on 09/28/22   Graceann Congress, PA-C [facility] [phone] 09/05/2022 7:34 AM

## 2022-09-05 NOTE — Discharge Summary (Signed)
Physician Discharge Summary  Patient ID: Andrew Harding MRN: [id number] DOB/AGE: [birth year] 72 y.o.  Admit date: 08/29/2022 Discharge date: 09/05/2022  Discharge Diagnoses:  Active Problems:   Diabetic osteomyelitis (HCC)   Type 2 diabetes mellitus with stage 3b chronic kidney disease, with long-term current use of insulin (HCC)   Benign essential hypertension   CKD (chronic kidney disease) stage 3, GFR 30-59 ml/min (HCC)   Coronary artery disease   Atrial fibrillation (HCC)   Diabetic infection of left foot (HCC)   S/P transmetatarsal amputation of foot, left (HCC)   Wound drainage   Discharged Condition: stable  Significant Diagnostic Studies: VAS [place] ABI WITH/WO TBI  Result Date: 09/04/2022  LOWER EXTREMITY DOPPLER STUDY Patient Name:  Andrew Harding  Date of Exam:   09/04/2022 Medical Rec #: [id number]    Accession #:    [id number] Date of Birth: 02-17-1951     Patient Gender: M Patient Age:   72 years Exam Location:  [facility] Procedure:      VAS [place] ABI WITH/WO TBI Referring Phys: Ovid Curd --------------------------------------------------------------------------------  Indications: Peripheral artery disease. Bilateral great toe amputations High Risk Factors: Hyperlipidemia, Diabetes.  Limitations: Today's exam was limited due to Right restricted arm. Comparison Study: No prior studies. Performing Technologist: Olen Cordial RVT  Examination Guidelines: A complete evaluation includes at minimum, Doppler waveform signals and systolic blood pressure reading at the level of bilateral brachial, anterior tibial, and posterior tibial arteries, when vessel segments are accessible. Bilateral testing is considered an integral part of a complete examination. Photoelectric Plethysmograph (PPG) waveforms and toe systolic pressure readings are included as required and additional duplex testing as needed. Limited examinations for reoccurring indications may be performed as noted.  ABI Findings:  +-----+------------------+-----+-----------+--------+ RightRt Pressure (mmHg)IndexWaveform   Comment  +-----+------------------+-----+-----------+--------+ PTA  85                0.69 monophasic          +-----+------------------+-----+-----------+--------+ DP   254               2.05 multiphasic         +-----+------------------+-----+-----------+--------+ +--------+------------------+-----+----------+-------+ Left    Lt Pressure (mmHg)IndexWaveform  Comment +--------+------------------+-----+----------+-------+ [id number]                    triphasic         +--------+------------------+-----+----------+-------+ PTA     66                0.53 monophasic        +--------+------------------+-----+----------+-------+ DP      254               2.05 monophasic        +--------+------------------+-----+----------+-------+ +-------+-----------+-----------+------------+------------+ ABI/TBIToday's ABIToday's TBIPrevious ABIPrevious TBI +-------+-----------+-----------+------------+------------+ Right  [place]                                             +-------+-----------+-----------+------------+------------+ Left   [place]                                             +-------+-----------+-----------+------------+------------+  Summary: Right: Resting right ankle-brachial index indicates noncompressible right lower extremity arteries. Unable to obtain TBI due to great toe amputation. Left: Resting  left ankle-brachial index indicates noncompressible left lower extremity arteries. Unable to obtain TBI due to great toe amputation. *See table(s) above for measurements and observations.  Electronically signed by Gerarda Fraction on 09/04/2022 at 7:30:26 PM.    Final    DG Foot Complete Left  Result Date: 09/02/2022 CLINICAL DATA:  [id number] Osteomyelitis of left foot (HCC) [id number] EXAM: LEFT ANKLE COMPLETE - 3+ VIEW; LEFT FOOT - COMPLETE 3+ VIEW COMPARISON:  August 19, 2022  FINDINGS: Status post surgical amputation of the forefoot. Surgical margins appear well corticated without suspicious erosion or irregularity. Scattered midfoot degenerative changes. Severe vascular calcifications. No acute fracture or dislocation. No definitive soft tissue air. IMPRESSION: Status post surgical amputation of the forefoot without radiographic evidence of osteomyelitis. Electronically Signed   By: Meda Klinefelter M.D.   On: 09/02/2022 19:46   DG Ankle Complete Left  Result Date: 09/02/2022 CLINICAL DATA:  [id number] Osteomyelitis of left foot (HCC) [id number] EXAM: LEFT ANKLE COMPLETE - 3+ VIEW; LEFT FOOT - COMPLETE 3+ VIEW COMPARISON:  August 19, 2022 FINDINGS: Status post surgical amputation of the forefoot. Surgical margins appear well corticated without suspicious erosion or irregularity. Scattered midfoot degenerative changes. Severe vascular calcifications. No acute fracture or dislocation. No definitive soft tissue air. IMPRESSION: Status post surgical amputation of the forefoot without radiographic evidence of osteomyelitis. Electronically Signed   By: Meda Klinefelter M.D.   On: 09/02/2022 19:46   DG Chest Port 1 View  Result Date: 08/25/2022 CLINICAL DATA:  Status post PICC line placement EXAM: PORTABLE CHEST 1 VIEW COMPARISON:  08/17/2022 FINDINGS: Right arm PICC line tip terminates in the superior cavoatrial junction. Heart size is normal. No pleural fluid or airspace disease. Visualized osseous structures are unremarkable. IMPRESSION: Right arm PICC line tip terminates in the superior cavoatrial junction. Electronically Signed   By: Signa Kell M.D.   On: 08/25/2022 14:16   [place] EKG SITE RITE  Result Date: 08/25/2022 If Site Rite image not attached, placement could not be confirmed due to current cardiac rhythm.  ECHOCARDIOGRAM COMPLETE  Result Date: 08/23/2022    ECHOCARDIOGRAM REPORT   Patient Name:   MARTISE DEBNAM Date of Exam: 08/21/2022 Medical Rec #:  [id number]   Height:        75.0 in Accession #:    [id number]  Weight:       250.0 lb Date of Birth:  1951/01/18    BSA:          2.413 m Patient Age:    72 years    BP:           132/64 mmHg Patient Gender: M           HR:           87 bpm. Exam Location:  [facility] Procedure: 2D Echo, Cardiac Doppler and Color Doppler Indications:     Atrial Fibrillation I48.91  History:         Patient has no prior history of Echocardiogram examinations.                  Prior CABG; Risk Factors:Hypertension, Dyslipidemia and                  Diabetes.  Sonographer:     Cristela Blue Referring Phys:  [id number] LILY MICHELLE TANG Diagnosing Phys: Alwyn Pea MD IMPRESSIONS  1. Left ventricular ejection fraction, by estimation, is 65 to 70%. The left ventricle has normal function. The left ventricle demonstrates regional wall  motion abnormalities (see scoring diagram/findings for description). Left ventricular diastolic parameters were normal.  2. Right ventricular systolic function is normal. The right ventricular size is normal.  3. The mitral valve is normal in structure. No evidence of mitral valve regurgitation.  4. The aortic valve is normal in structure. Aortic valve regurgitation is not visualized. FINDINGS  Left Ventricle: Left ventricular ejection fraction, by estimation, is 65 to 70%. The left ventricle has normal function. The left ventricle demonstrates regional wall motion abnormalities. The left ventricular internal cavity size was normal in size. There is no left ventricular hypertrophy. Left ventricular diastolic parameters were normal. Right Ventricle: The right ventricular size is normal. No increase in right ventricular wall thickness. Right ventricular systolic function is normal. Left Atrium: Left atrial size was normal in size. Right Atrium: Right atrial size was normal in size. Pericardium: There is no evidence of pericardial effusion. Mitral Valve: The mitral valve is normal in structure. No evidence of mitral valve regurgitation. MV  peak gradient, 6.5 mmHg. The mean mitral valve gradient is 3.0 mmHg. Tricuspid Valve: The tricuspid valve is normal in structure. Tricuspid valve regurgitation is trivial. Aortic Valve: The aortic valve is normal in structure. Aortic valve regurgitation is not visualized. Aortic valve mean gradient measures 2.0 mmHg. Aortic valve peak gradient measures 3.8 mmHg. Aortic valve area, by VTI measures 3.63 cm. Pulmonic Valve: The pulmonic valve was grossly normal. Pulmonic valve regurgitation is not visualized. Aorta: The ascending aorta was not well visualized. IAS/Shunts: No atrial level shunt detected by color flow Doppler.  LEFT VENTRICLE PLAX 2D LVIDd:         4.40 cm LVIDs:         2.70 cm LV PW:         1.00 cm LV IVS:        1.10 cm LVOT diam:     2.20 cm LV SV:         56 LV SV Index:   23 LVOT Area:     3.80 cm  RIGHT VENTRICLE RV Basal diam:  3.30 cm RV Mid diam:    3.70 cm RV S prime:     12.20 cm/s TAPSE (M-mode): 1.4 cm LEFT ATRIUM             Index        RIGHT ATRIUM           Index LA diam:        4.30 cm 1.78 cm/m   RA Area:     19.80 cm LA Vol (A2C):   80.0 ml 33.16 ml/m  RA Volume:   57.90 ml  24.00 ml/m LA Vol (A4C):   80.8 ml 33.49 ml/m LA Biplane Vol: 83.0 ml 34.40 ml/m  AORTIC VALVE AV Area (Vmax):    3.11 cm AV Area (Vmean):   3.41 cm AV Area (VTI):     3.63 cm AV Vmax:           97.45 cm/s AV Vmean:          70.150 cm/s AV VTI:            0.154 m AV Peak Grad:      3.8 mmHg AV Mean Grad:      2.0 mmHg LVOT Vmax:         79.80 cm/s LVOT Vmean:        63.000 cm/s LVOT VTI:          0.147 m LVOT/AV VTI ratio:  0.95  AORTA Ao Root diam: 3.50 cm MITRAL VALVE                TRICUSPID VALVE MV Area (PHT): 4.63 cm     TR Peak grad:   25.0 mmHg MV Area VTI:   3.12 cm     TR Vmax:        250.00 cm/s MV Peak grad:  6.5 mmHg MV Mean grad:  3.0 mmHg     SHUNTS MV Vmax:       1.27 m/s     Systemic VTI:  0.15 m MV Vmean:      71.7 cm/s    Systemic Diam: 2.20 cm MV Decel Time: 164 msec MV E  velocity: 127.00 cm/s Alwyn Pea MD Electronically signed by Alwyn Pea MD Signature Date/Time: 08/23/2022/7:30:34 AM    Final    PERIPHERAL VASCULAR CATHETERIZATION  Result Date: 08/22/2022 See surgical note for result.  DG Foot Complete Left  Result Date: 08/19/2022 CLINICAL DATA:  Postop. EXAM: LEFT FOOT - COMPLETE 3+ VIEW COMPARISON:  Preoperative exam earlier today FINDINGS: Interval transmetatarsal amputation of all 5 rays. Presumed antibiotic beads projecting over the mid metatarsals as well as the ankle at the level of the medial malleolus. Overlying skin staples in place. IMPRESSION: Interval transmetatarsal amputation of all 5 rays. Presumed antibiotic beads projecting over the mid metatarsals and medial ankle. Electronically Signed   By: Narda Rutherford M.D.   On: 08/19/2022 16:48   DG Foot 2 Views Left  Result Date: 08/19/2022 CLINICAL DATA:  Infection, patient reports recent surgery. EXAM: LEFT FOOT - 2 VIEW COMPARISON:  Foot MRI yesterday FINDINGS: Prior amputation of the second and third toes as well as fourth toe at the proximal aspect of the proximal phalanx. Focal defect involving the residual fourth toe proximal phalanx as seen on prior MRI. No definite radiographic correlate to the edematous changes in the second and third metatarsal heads on MRI. No evidence of acute fracture. Soft tissue edema is seen over the dorsum of the forefoot. Vascular calcifications. IMPRESSION: 1. Prior amputation of the second and third toes as well as fourth toe at the proximal phalanx. Small defect in the fourth toe proximal phalanx corresponding to MRI findings. No radiographic correlate to the marrow edema changes in the second and third metatarsal heads on MRI. 2. Forefoot soft tissue edema. Electronically Signed   By: Narda Rutherford M.D.   On: 08/19/2022 16:45   MR FOOT LEFT WO CONTRAST  Result Date: 08/18/2022 CLINICAL DATA:  Foot swelling, diabetic, osteomyelitis suspected, xray  done EXAM: MRI OF THE LEFT FOOT WITHOUT CONTRAST TECHNIQUE: Multiplanar, multisequence MR imaging of the left forefoot was performed. No intravenous contrast was administered. COMPARISON:  None Available. FINDINGS: Bones/Joint/Cartilage Postsurgical changes of amputation of the second and third toes at the metatarsophalangeal joint level. Prior fourth toe amputation through the base of the proximal phalanx. Surgical defect or focal erosion involving the distal aspect of the residual fourth toe proximal phalanx (series 9, image 14). There is periosteal edema of the second and third metatarsal heads with relatively mild bone marrow edema. Prominent bone marrow edema within the first and third metatarsal diaphyses and at the proximal metaphysis of the second metatarsal. No well-defined fracture line. Normal alignment. Relatively mild degenerative changes. Ligaments Intact Lisfranc ligament. Remaining collateral ligaments are intact. Muscles and Tendons Chronic denervation changes of the foot musculature. Amputation changes of the musculotendinous structures of the second through fourth toes. No tenosynovitis.  Soft tissues Soft tissue wound or ulceration at the dorsal aspect of the forefoot overlying the second metatarsal head. Additional shallow wounds or ulcerations dorsal to the third and fourth metatarsal heads. Extensive soft tissue edema. No organized or drainable fluid collections. IMPRESSION: 1. Postsurgical changes of amputation of the second through fourth toes. Soft tissue wounds or ulcerations at the dorsal aspect of the forefoot overlying the second metatarsal head. Additional shallow wounds or ulcerations dorsal to the third and fourth metatarsal heads. 2. Small postsurgical defect or focal erosion involving the distal aspect of the residual fourth toe proximal phalanx. Appearance is suspicious for osteomyelitis. 3. Periosteal edema of the second and third metatarsal heads with relatively mild bone marrow  edema. Findings are suspicious for early acute osteomyelitis. 4. Prominent bone marrow edema within the first and third metatarsal diaphyses and at the proximal metaphysis of the second metatarsal. No well-defined fracture line. Findings are favored to represent stress-related changes. 5. Extensive soft tissue edema. No organized or drainable fluid collections. Electronically Signed   By: Duanne Guess D.O.   On: 08/18/2022 20:24   CT Angio Chest PE W and/or Wo Contrast  Result Date: 08/17/2022 CLINICAL DATA:  Sepsis. EXAM: CT ANGIOGRAPHY CHEST WITH CONTRAST TECHNIQUE: Multidetector CT imaging of the chest was performed using the standard protocol during bolus administration of intravenous contrast. Multiplanar CT image reconstructions and MIPs were obtained to evaluate the vascular anatomy. RADIATION DOSE REDUCTION: This exam was performed according to the departmental dose-optimization program which includes automated exposure control, adjustment of the mA and/or kV according to patient size and/or use of iterative reconstruction technique. CONTRAST:  60mL OMNIPAQUE IOHEXOL 350 MG/ML SOLN COMPARISON:  Chest x-ray 08/17/2022 earlier FINDINGS: Cardiovascular: No pulmonary embolism identified. Status post median sternotomy. Heart is nonenlarged. No significant pericardial effusion. The thoracic aorta has a normal course and caliber with mild atherosclerotic calcified plaque. Coronary artery calcifications are noted. 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 Mediastinum/Nodes: Mildly patulous esophagus. No specific abnormal lymph node enlargement seen in the axillary region, hilum or mediastinum. Lungs/Pleura: Mild diffuse breathing motion. No consolidation, pneumothorax or effusion. Dependent atelectasis. 3 mm nodule right upper lobe on series 5, image 77 is noncalcified. Similar focus on image 56 but this has a component of calcification peripherally. Upper Abdomen: Gallstones in the  nondilated gallbladder. There is dystrophic calcification along the left adrenal gland. Please correlate with any previous infectious, inflammatory or traumatic process. This was seen on the CT scan of 2014 of the abdomen and pelvis. Musculoskeletal: Mild degenerative changes along the spine. Review of the MIP images confirms the above findings. IMPRESSION: No pulmonary embolism identified.  Mild breathing motion. Postop chest. Gallstones. 3 mm lung nodules. No follow-up needed if patient is low-risk (and has no known or suspected primary neoplasm). Non-contrast chest CT can be considered in 12 months if patient is high-risk. This recommendation follows the consensus statement: Guidelines for Management of Incidental Pulmonary Nodules Detected on CT Images: From the Fleischner Society 2017; Radiology 2017; 284:228-243. Aortic Atherosclerosis (ICD10-I70.0). Electronically Signed   By: Karen Kays M.D.   On: 08/17/2022 13:43   CT HEAD WO CONTRAST ( )  Result Date: 08/17/2022 CLINICAL DATA:  Possible wound infection to left foot EXAM: CT HEAD WITHOUT CONTRAST TECHNIQUE: Contiguous axial images were obtained from the base of the skull through the vertex without intravenous contrast. RADIATION DOSE REDUCTION: This exam was performed according to the departmental dose-optimization program which includes automated exposure control, adjustment of  the mA and/or kV according to patient size and/or use of iterative reconstruction technique. COMPARISON:  None Available. FINDINGS: Brain: No evidence of acute infarction, hemorrhage, mass, mass effect, or midline shift. No hydrocephalus or extra-axial fluid collection. Vascular: No hyperdense vessel. Atherosclerotic calcifications in the intracranial carotid and vertebral arteries. Skull: Negative for fracture or focal lesion. Sinuses/Orbits: Mucosal thickening in the ethmoid air cells and left maxillary sinus. No acute finding in the orbits. Status post bilateral lens  replacements. Other: The mastoid air cells are well aerated. IMPRESSION: No acute intracranial process. Electronically Signed   By: Wiliam Ke M.D.   On: 08/17/2022 13:35   DG Chest 2 View  Result Date: 08/17/2022 CLINICAL DATA:  Sepsis EXAM: CHEST - 2 VIEW COMPARISON:  CXR 07/19/12 FINDINGS: Status median sternotomy and CABG. No pleural effusion. No pneumothorax. Normal cardiac and mediastinal contours. No focal airspace opacity. No radiographically apparent displaced rib fractures. Vertebral body heights are visualized upper abdomen is unremarkable IMPRESSION: No focal airspace opacity Electronically Signed   By: Lorenza Cambridge M.D.   On: 08/17/2022 10:20   DG MINI C-ARM IMAGE ONLY  Result Date: 08/11/2022 There is no interpretation for this exam.  This order is for images obtained during a surgical procedure.  Please See "Surgeries" Tab for more information regarding the procedure.    Labs:  Basic Metabolic Panel: Recent Labs  Lab 08/30/22 0324 09/04/22 0032 09/05/22 0415  NA 136 136 135  K 4.3 3.9 3.9  CL 103 103 104  CO2 25 25 24   GLUCOSE 132* 106* 109*  BUN 23 42* 35*  CREATININE 1.21 1.40* 1.25*  CALCIUM 8.7* 8.9 8.9    CBC: Recent Labs  Lab 08/30/22 0324 09/02/22 1517 09/04/22 0032  WBC 8.5 8.9 7.0  NEUTROABS 6.5 6.9 4.3  HGB 10.0* 10.3* 9.9*  HCT 31.4* 31.1* 31.4*  MCV 94.3 93.1 94.3  PLT 413* 369 358    CBG: Recent Labs  Lab 09/04/22 1214 09/04/22 1710 09/04/22 2119 09/04/22 2252 09/05/22 0615  GLUCAP 167* 185* 148* 137* 100*    Brief HPI:   DAILEN MILNE is a 72 y.o. male ***   Hospital Course: YISSACHAR MCKELL was admitted to rehab 08/29/2022 for inpatient therapies to consist of PT and OT at least three hours five days a week. Past admission physiatrist, therapy team and rehab RN have worked together to provide customized collaborative inpatient rehab.   Blood pressures were monitored on TID basis and   His diabetes has been monitored with ac/hs CBG  checks and SSI was use prn for tighter BS control.    Rehab course: During patient's stay in rehab weekly team conferences were held to monitor patient's progress, set goals and discuss barriers to discharge. At admission, patient required  He  has had improvement in activity tolerance, balance, postural control as well as ability to compensate for deficits. He has had improvement in functional use LLE as well as  as improvement in awareness       Disposition: Home  Diet: Heart healthy/Carb Modified.   Special Instructions:  Discharge Instructions     [facility] pharmacist to adjust dose for Vancomycin, Aminoglycosides and other anti-infective therapies as requested by physician.   Complete by: As directed    [facility] to provide Cath Flo 2mg    Complete by: As directed    Administer for PICC line occlusion and as ordered by physician for other access device issues.   Ambulatory referral to  Physical Medicine Rehab   Complete by: As directed    Anaphylaxis Kit: Provided to treat any anaphylactic reaction to the medication being provided to the patient if First Dose or when requested by physician   Complete by: As directed    Epinephrine 1mg /ml vial / amp: Administer 0.3mg  (0.65ml) subcutaneously once for moderate to severe anaphylaxis, nurse to call physician and pharmacy when reaction occurs and call 911 if needed for immediate care   Diphenhydramine 50mg /ml IV vial: Administer 25-50mg  IV/IM PRN for first dose reaction, rash, itching, mild reaction, nurse to call physician and pharmacy when reaction occurs   Sodium Chloride 0.9% NS IV: Administer if needed for hypovolemic blood pressure drop or as ordered by physician after call to physician with anaphylactic reaction   Change dressing on IV access line weekly and PRN   Complete by: As directed    Flush IV access with Sodium Chloride 0.9% and Heparin 10 units/ml or 100 units/ml   Complete by: As  directed    Home infusion instructions - [facility]   Complete by: As directed    Instructions: Flush IV access with Sodium Chloride 0.9% and Heparin 10units/ml or 100units/ml   Change dressing on IV access line: Weekly and PRN   Instructions Cath Flo 2mg : Administer for PICC Line occlusion and as ordered by physician for other access device   [facility] pharmacist to adjust dose for: Vancomycin, Aminoglycosides and other anti-infective therapies as requested by physician   Method of administration may be changed at the discretion of home infusion pharmacist based upon assessment of the patient and/or caregiver's ability to self-administer the medication ordered   Complete by: As directed       Allergies as of 09/05/2022   No Known Allergies      Medication List     STOP taking these medications    betamethasone dipropionate 0.05 % cream   insulin glargine 100 UNIT/ML injection Commonly known as: LANTUS Replaced by: insulin glargine 100 UNIT/ML Solostar Pen   lisinopril 20 MG tablet Commonly known as: ZESTRIL   metFORMIN 500 MG 24 hr tablet Commonly known as: GLUCOPHAGE-XR   oxyCODONE 5 MG immediate release tablet Commonly known as: Oxy IR/ROXICODONE   silver sulfADIAZINE 1 % cream Commonly known as: SILVADENE       TAKE these medications    acetaminophen 325 MG tablet Commonly known as: TYLENOL Take 1-2 tablets (325-650 mg total) by mouth every 4 (four) hours as needed for mild pain.   apixaban 5 MG Tabs tablet Commonly known as: ELIQUIS Take 1 tablet (5 mg total) by mouth 2 (two) times daily.   ascorbic acid 500 MG tablet Commonly known as: VITAMIN C Take 1 tablet (500 mg total) by mouth 2 (two) times daily.   cholecalciferol 1000 units tablet Commonly known as: VITAMIN D Take 2,000 Units by mouth daily.   clopidogrel 75 MG tablet Commonly known as: PLAVIX Take 1 tablet (75 mg total) by mouth daily with breakfast.   docusate  sodium 100 MG capsule Commonly known as: COLACE Take 1 capsule (100 mg total) by mouth 2 (two) times daily.   Fifty50 Pen Needles 32G X 4 MM Misc Generic drug: Insulin Pen Needle USE 4 TIMES DAILY   gabapentin 300 MG capsule Commonly known as: NEURONTIN Take 1 capsule (300 mg total) by mouth 3 (three) times daily.   insulin glargine 100 UNIT/ML Solostar Pen Commonly known as: LANTUS Inject 36 Units into the skin at  bedtime. Replaces: insulin glargine 100 UNIT/ML injection   meropenem  IVPB Commonly known as: MERREM Inject 1 g into the vein every 8 (eight) hours for 14 days. Indication:  DFU/osteomyelitis  Last Day of Therapy:  09/19/22 Labs - Once weekly:  CBC/D, CMP, ESR and CRP Please leave PICC in place until doctor has seen patient or been notified Fax weekly labs to [phone] Method of administration: Mini-Bag Plus / Gravity Method of administration may be changed at the discretion of home infusion pharmacist based upon assessment of the patient and/or caregiver's ability to self-administer the medication ordered.   metoprolol tartrate 25 MG tablet Commonly known as: LOPRESSOR Take 1 tablet (25 mg total) by mouth 2 (two) times daily.   NovoLOG FlexPen 100 UNIT/ML FlexPen Generic drug: insulin aspart Inject 15-20 Units into the skin 3 (three) times daily with meals.   nutrition supplement (JUVEN) Pack Take 1 packet by mouth 2 (two) times daily between meals. Purchase over the counter or on [employer]   nystatin 100000 UNIT/ML suspension Commonly known as: MYCOSTATIN Take 5 mLs (500,000 Units total) by mouth 4 (four) times daily. Notes to patient: Swish and swallow to clear white coating on tongue--can change to as needed   OneTouch Ultra test strip Generic drug: glucose blood Use 3 (three) times daily   pravastatin 80 MG tablet Commonly known as: PRAVACHOL TAKE ONE TABLET BY MOUTH AT NIGHT   traZODone 50 MG tablet Commonly known as: DESYREL TAKE ONE TABLET AT  BEDTIME AS NEEDED   Trulicity 4.5 MG/0.5ML Sopn Generic drug: Dulaglutide Inject into the skin. Notes to patient: Once a week.    Zinc Sulfate 220 (50 Zn) MG Tabs Take 1 tablet (220 mg total) by mouth daily with supper.               Discharge Care Instructions  (From admission, onward)           Start     Ordered   09/04/22 0000  Change dressing on IV access line weekly and PRN  (Home infusion instructions - [facility] )        09/04/22 1232            Follow-up Information     Lynnea Ferrier, MD Follow up.   Specialty: Internal Medicine Why: Call in 1-2 days for post hospital follow up Contact information: [address] [facility] [place] [address] [phone]         Fanny Dance, MD Follow up.   Specialty: Physical Medicine and Rehabilitation Why: office will call you with follow up appointment Contact information: [address] [place] [address] [phone]         Rosetta Posner, DPM. Call today.   Specialty: Podiatry Why: for post hospital follow up Contact information: [address] [place] [address] [phone]         [facility]. Go on 09/28/2022.   Why: Appointment at 9 am for vascular study/follow up Contact information: [address] [place] [address] [phone]        Lynn Ito, MD. Go on 09/14/2022.   Specialty: Infectious Diseases Why: Appointment at 8:45 am Contact information: [address] [place] [address] [phone]                 Signed: Jacquelynn Cree 09/05/2022, 10:56 AM

## 2022-09-05 NOTE — Progress Notes (Signed)
Subjective: No new complaints   Antibiotics:  Anti-infectives (From admission, onward)    Start     Dose/Rate Route Frequency Ordered Stop   09/05/22 0000  meropenem (MERREM) IVPB  Status:  Discontinued        1 g Intravenous Every 8 hours 09/04/22 1232 09/05/22    09/05/22 0000  meropenem (MERREM) IVPB        1 g Intravenous Every 8 hours 09/05/22 0804 09/19/22 2359   08/29/22 2200  meropenem (MERREM) 1 g in sodium chloride 0.9 % 100 mL IVPB        1 g 200 mL/hr over 30 Minutes Intravenous Every 8 hours 08/29/22 1529 09/19/22 2359       Medications: Scheduled Meds:  (feeding supplement) PROSource Plus  30 mL Oral BID WC   apixaban  5 mg Oral BID   ascorbic acid  500 mg Oral BID   Chlorhexidine Gluconate Cloth  6 each Topical Q12H   cholecalciferol  2,000 Units Oral Daily   clopidogrel  75 mg Oral Q breakfast   docusate sodium  100 mg Oral BID   gabapentin  300 mg Oral TID   insulin aspart  0-5 Units Subcutaneous QHS   insulin aspart  0-9 Units Subcutaneous TID WC   insulin glargine-yfgn  36 Units Subcutaneous QHS   metoprolol tartrate  25 mg Oral BID   nutrition supplement (JUVEN)  1 packet Oral BID BM   nystatin  5 mL Oral QID   Ensure Max Protein  11 oz Oral QPC supper   sodium chloride flush  10-40 mL Intracatheter Q12H   zinc sulfate  220 mg Oral Q supper   Continuous Infusions:  sodium chloride 75 mL/hr at 09/05/22 0346   meropenem (MERREM) IV 1 g (09/05/22 0608)   PRN Meds:.acetaminophen, alum & mag hydroxide-simeth, bisacodyl, diphenhydrAMINE, guaiFENesin-dextromethorphan, ipratropium-albuterol, oxyCODONE, polyethylene glycol, prochlorperazine **OR** prochlorperazine **OR** prochlorperazine, sodium chloride flush, sodium phosphate, traMADol, traZODone    Objective: Weight change: -5 kg  Intake/Output Summary (Last 24 hours) at 09/05/2022 1057 Last data filed at 09/04/2022 1829 Gross per 24 hour  Intake 2121.5 ml  Output 200 ml  Net 1921.5 ml    Blood pressure 113/64, pulse 70, temperature 97.7 F (36.5 C), temperature source Oral, resp. rate 18, height 6\' 3"  (1.905 m), weight 93.1 kg, SpO2 97 %. Temp:  [97.7 F (36.5 C)-98.3 F (36.8 C)] 97.7 F (36.5 C) (05/07 0345) Pulse Rate:  [70-102] 70 (05/07 0345) Resp:  [16-18] 18 (05/07 0345) BP: (112-130)/(64-76) 113/64 (05/07 0345) SpO2:  [97 %-100 %] 97 % (05/07 0345) Weight:  [93.1 kg] 93.1 kg (05/07 0500)  Physical Exam: Physical Exam Constitutional:      Appearance: He is well-developed.  HENT:     Head: Normocephalic and atraumatic.  Eyes:     Conjunctiva/sclera: Conjunctivae normal.  Cardiovascular:     Rate and Rhythm: Normal rate and regular rhythm.  Pulmonary:     Effort: Pulmonary effort is normal. No respiratory distress.     Breath sounds: No wheezing.  Abdominal:     General: There is no distension.     Palpations: Abdomen is soft.  Musculoskeletal:     Cervical back: Normal range of motion and neck supple.  Skin:    General: Skin is warm and dry.     Findings: No erythema or rash.  Neurological:     General: No focal deficit present.     Mental Status:  He is alert and oriented to person, place, and time.  Psychiatric:        Mood and Affect: Mood normal.        Behavior: Behavior normal.        Thought Content: Thought content normal.        Judgment: Judgment normal.     Foot        CBC:    BMET Recent Labs    09/04/22 0032 09/05/22 0415  NA 136 135  K 3.9 3.9  CL 103 104  CO2 25 24  GLUCOSE 106* 109*  BUN 42* 35*  CREATININE 1.40* 1.25*  CALCIUM 8.9 8.9     Liver Panel  Recent Labs    09/04/22 0032  PROT 7.0  ALBUMIN 2.6*  AST 39  ALT 38  ALKPHOS 101  BILITOT 0.7       Sedimentation Rate Recent Labs    09/04/22 0032  ESRSEDRATE 117*   C-Reactive Protein Recent Labs    09/04/22 0032  CRP 0.7    Micro Results: Recent Results (from the past 720 hour(s))  Culture, blood (Routine x 2)     Status:  None   Collection Time: 08/17/22  9:44 AM   Specimen: BLOOD RIGHT FOREARM  Result Value Ref Range Status   Specimen Description BLOOD RIGHT FOREARM  Final   Special Requests   Final    BOTTLES DRAWN AEROBIC AND ANAEROBIC Blood Culture adequate volume   Culture   Final    NO GROWTH 5 DAYS Performed at [facility], [address] [place], [place] [address]    Report Status 08/22/2022 FINAL  Final  Culture, blood (Routine x 2)     Status: None   Collection Time: 08/17/22  9:44 AM   Specimen: BLOOD RIGHT WRIST  Result Value Ref Range Status   Specimen Description BLOOD RIGHT WRIST  Final   Special Requests   Final    BOTTLES DRAWN AEROBIC AND ANAEROBIC Blood Culture results may not be optimal due to an inadequate volume of blood received in culture bottles   Culture   Final    NO GROWTH 5 DAYS Performed at [facility], [address], [place], [place] [address]    Report Status 08/22/2022 FINAL  Final  Aerobic Culture w Gram Stain (superficial specimen)     Status: None   Collection Time: 08/18/22 10:39 AM   Specimen: Wound  Result Value Ref Range Status   Specimen Description   Final    WOUND Performed at [facility], [address] [place], [place] [address]    Special Requests   Final    LEFT TOE Performed at [facility], [address] [place], [place] [address]    Gram Stain   Final    FEW WBC PRESENT, PREDOMINANTLY PMN MODERATE GRAM NEGATIVE RODS FEW GRAM POSITIVE COCCI Performed at [facility], [address] [place], [place] [address]    Culture   Final    MODERATE ENTEROBACTER CLOACAE MODERATE ENTEROCOCCUS FAECALIS    Report Status 08/21/2022 FINAL  Final   Organism ID, Bacteria ENTEROBACTER CLOACAE  Final   Organism ID, Bacteria ENTEROCOCCUS FAECALIS  Final      Susceptibility   Enterobacter cloacae - MIC*    CEFEPIME <=0.12 SENSITIVE Sensitive     CEFTAZIDIME <=1 SENSITIVE Sensitive     CIPROFLOXACIN  <=0.25 SENSITIVE Sensitive     GENTAMICIN <=1 SENSITIVE Sensitive  IMIPENEM 1 SENSITIVE Sensitive     TRIMETH/SULFA >=320 RESISTANT Resistant     PIP/TAZO <=4 SENSITIVE Sensitive     * MODERATE ENTEROBACTER CLOACAE   Enterococcus faecalis - MIC*    AMPICILLIN <=2 SENSITIVE Sensitive     VANCOMYCIN 1 SENSITIVE Sensitive     GENTAMICIN SYNERGY SENSITIVE Sensitive     * MODERATE ENTEROCOCCUS FAECALIS  Aerobic/Anaerobic Culture w Gram Stain (surgical/deep wound)     Status: None   Collection Time: 08/23/22  2:43 PM   Specimen: Path fluid; Body Fluid  Result Value Ref Range Status   Specimen Description WOUND LEFT FOOT  Final   Special Requests DRAIN  Final   Gram Stain NO WBC SEEN NO ORGANISMS SEEN   Final   Culture   Final    No growth aerobically or anaerobically. Performed at [facility], [address], [place], [place] [address]    Report Status 08/27/2022 FINAL  Final    Studies/Results: VAS [place] ABI WITH/WO TBI  Result Date: 09/04/2022  LOWER EXTREMITY DOPPLER STUDY Patient Name:  Andrew Harding  Date of Exam:   09/04/2022 Medical Rec #: [id number]    Accession #:    [id number] Date of Birth: 1951-03-01     Patient Gender: M Patient Age:   62 years Exam Location:  [facility] Procedure:      VAS [place] ABI WITH/WO TBI Referring Phys: Ovid Curd --------------------------------------------------------------------------------  Indications: Peripheral artery disease. Bilateral great toe amputations High Risk Factors: Hyperlipidemia, Diabetes.  Limitations: Today's exam was limited due to Right restricted arm. Comparison Study: No prior studies. Performing Technologist: Olen Cordial RVT  Examination Guidelines: A complete evaluation includes at minimum, Doppler waveform signals and systolic blood pressure reading at the level of bilateral brachial, anterior tibial, and posterior tibial arteries, when vessel segments are accessible. Bilateral testing is considered an integral  part of a complete examination. Photoelectric Plethysmograph (PPG) waveforms and toe systolic pressure readings are included as required and additional duplex testing as needed. Limited examinations for reoccurring indications may be performed as noted.  ABI Findings: +-----+------------------+-----+-----------+--------+ RightRt Pressure (mmHg)IndexWaveform   Comment  +-----+------------------+-----+-----------+--------+ PTA  85                0.69 monophasic          +-----+------------------+-----+-----------+--------+ DP   254               2.05 multiphasic         +-----+------------------+-----+-----------+--------+ +--------+------------------+-----+----------+-------+ Left    Lt Pressure (mmHg)IndexWaveform  Comment +--------+------------------+-----+----------+-------+ [id number]                    triphasic         +--------+------------------+-----+----------+-------+ PTA     66                0.53 monophasic        +--------+------------------+-----+----------+-------+ DP      254               2.05 monophasic        +--------+------------------+-----+----------+-------+ +-------+-----------+-----------+------------+------------+ ABI/TBIToday's ABIToday's TBIPrevious ABIPrevious TBI +-------+-----------+-----------+------------+------------+ Right  Ruth                                             +-------+-----------+-----------+------------+------------+ Left   [place]                                             +-------+-----------+-----------+------------+------------+  Summary: Right: Resting right ankle-brachial index indicates noncompressible right lower extremity arteries. Unable to obtain TBI due to great toe amputation. Left: Resting left ankle-brachial index indicates noncompressible left lower extremity arteries. Unable to obtain TBI due to great toe amputation. *See table(s) above for measurements and observations.  Electronically signed by  Gerarda Fraction on 09/04/2022 at 7:30:26 PM.    Final       Assessment/Plan:  INTERVAL HISTORY: Asked to see the patient again due to concerns about his wound not healing up properly   Active Problems:   Diabetic osteomyelitis (HCC)   Type 2 diabetes mellitus with stage 3b chronic kidney disease, with long-term current use of insulin (HCC)   Benign essential hypertension   CKD (chronic kidney disease) stage 3, GFR 30-59 ml/min (HCC)   Coronary artery disease   Atrial fibrillation (HCC)   Diabetic infection of left foot (HCC)   S/P transmetatarsal amputation of foot, left (HCC)   Wound drainage    Andrew Harding is a 72 y.o. male with vascular disease diabetic foot ulcer with osteomyelitis status post TMA with Enterobacter and E faecalis isolated.   He has been revascularized and had surgery  He certainly as VVS have discussed has vascular disease that cannot be intervened upon.  He still remains at risk of limb loss.  I do not think his wound is doing worse though than before.  I would recommend proceeding with his original course of meropenem with follow-up with Dr. Rivka Safer at [facility].  I have personally spent 54 minutes involved in face-to-face and non-face-to-face activities for this patient on the day of the visit. Professional time spent includes the following activities: Preparing to see the patient (review of tests), Obtaining and/or reviewing separately obtained history (admission/discharge record), Performing a medically appropriate examination and/or evaluation , Ordering medications/tests/procedures, referring and communicating with other health care professionals, Documenting clinical information in the EMR, Independently interpreting results (not separately reported), Communicating results to the patient/family/caregiver, Counseling and educating the patient/family/caregiver and Care coordination (not separately reported).       LOS: 7 days   Acey Lav 09/05/2022, 10:57 AM

## 2022-09-06 NOTE — Plan of Care (Signed)
  Problem: RH Ambulation Goal: LTG Patient will ambulate in controlled environment (PT) Description: LTG: Patient will ambulate in a controlled environment, # of feet with assistance (PT). Outcome: Adequate for Discharge   Problem: RH Balance Goal: LTG Patient will maintain dynamic standing balance (PT) Description: LTG:  Patient will maintain dynamic standing balance with assistance during mobility activities (PT) Outcome: Completed/Met   Problem: Sit to Stand Goal: LTG:  Patient will perform sit to stand with assistance level (PT) Description: LTG:  Patient will perform sit to stand with assistance level (PT) Outcome: Completed/Met   Problem: RH Bed Mobility Goal: LTG Patient will perform bed mobility with assist (PT) Description: LTG: Patient will perform bed mobility with assistance, with/without cues (PT). Outcome: Completed/Met   Problem: RH Bed to Chair Transfers Goal: LTG Patient will perform bed/chair transfers w/assist (PT) Description: LTG: Patient will perform bed to chair transfers with assistance (PT). Outcome: Completed/Met   Problem: RH Car Transfers Goal: LTG Patient will perform car transfers with assist (PT) Description: LTG: Patient will perform car transfers with assistance (PT). Outcome: Completed/Met   Problem: RH Floor Transfers Goal: LTG Patient will perform floor transfers w/assist (PT) Description: LTG: Patient will perform floor transfers with assistance (PT). Outcome: Completed/Met   Problem: RH Ambulation Goal: LTG Patient will ambulate in home environment (PT) Description: LTG: Patient will ambulate in home environment, # of feet with assistance (PT). Outcome: Completed/Met   Problem: RH Wheelchair Mobility Goal: LTG Patient will propel w/c in controlled environment (PT) Description: LTG: Patient will propel wheelchair in controlled environment, # of feet with assist (PT) Outcome: Completed/Met Goal: LTG Patient will propel w/c in home  environment (PT) Description: LTG: Patient will propel wheelchair in home environment, # of feet with assistance (PT). Outcome: Completed/Met Goal: LTG Patient will propel w/c in community environment (PT) Description: LTG: Patient will propel wheelchair in community environment, # of feet with assist (PT) Outcome: Completed/Met

## 2022-09-12 ENCOUNTER — Other Ambulatory Visit: Payer: Self-pay | Admitting: Podiatry

## 2022-09-13 ENCOUNTER — Other Ambulatory Visit: Payer: Managed Care, Other (non HMO)

## 2022-09-14 ENCOUNTER — Ambulatory Visit: Payer: Managed Care, Other (non HMO) | Attending: Infectious Diseases | Admitting: Infectious Diseases

## 2022-09-14 ENCOUNTER — Encounter: Payer: Self-pay | Admitting: Infectious Diseases

## 2022-09-14 ENCOUNTER — Other Ambulatory Visit: Payer: Self-pay

## 2022-09-14 ENCOUNTER — Encounter
Admission: RE | Admit: 2022-09-14 | Discharge: 2022-09-14 | Disposition: A | Discharge status: Home / Self Care | Payer: Managed Care, Other (non HMO) | Source: Ambulatory Visit | Attending: Podiatry | Admitting: Podiatry

## 2022-09-14 VITALS — BP 97/59 | HR 84 | Temp 96.7°F

## 2022-09-14 DIAGNOSIS — Z87891 Personal history of nicotine dependence: Secondary | ICD-10-CM | POA: Diagnosis not present

## 2022-09-14 DIAGNOSIS — E1122 Type 2 diabetes mellitus with diabetic chronic kidney disease: Secondary | ICD-10-CM | POA: Insufficient documentation

## 2022-09-14 DIAGNOSIS — D631 Anemia in chronic kidney disease: Secondary | ICD-10-CM | POA: Insufficient documentation

## 2022-09-14 DIAGNOSIS — L0889 Other specified local infections of the skin and subcutaneous tissue: Secondary | ICD-10-CM | POA: Insufficient documentation

## 2022-09-14 DIAGNOSIS — B952 Enterococcus as the cause of diseases classified elsewhere: Secondary | ICD-10-CM | POA: Diagnosis not present

## 2022-09-14 DIAGNOSIS — Z89411 Acquired absence of right great toe: Secondary | ICD-10-CM | POA: Insufficient documentation

## 2022-09-14 DIAGNOSIS — E8809 Other disorders of plasma-protein metabolism, not elsewhere classified: Secondary | ICD-10-CM | POA: Insufficient documentation

## 2022-09-14 DIAGNOSIS — Z951 Presence of aortocoronary bypass graft: Secondary | ICD-10-CM | POA: Diagnosis not present

## 2022-09-14 DIAGNOSIS — B9689 Other specified bacterial agents as the cause of diseases classified elsewhere: Secondary | ICD-10-CM | POA: Insufficient documentation

## 2022-09-14 DIAGNOSIS — R7401 Elevation of levels of liver transaminase levels: Secondary | ICD-10-CM | POA: Insufficient documentation

## 2022-09-14 DIAGNOSIS — I2510 Atherosclerotic heart disease of native coronary artery without angina pectoris: Secondary | ICD-10-CM | POA: Diagnosis not present

## 2022-09-14 DIAGNOSIS — I129 Hypertensive chronic kidney disease with stage 1 through stage 4 chronic kidney disease, or unspecified chronic kidney disease: Secondary | ICD-10-CM | POA: Diagnosis not present

## 2022-09-14 DIAGNOSIS — E1142 Type 2 diabetes mellitus with diabetic polyneuropathy: Secondary | ICD-10-CM | POA: Diagnosis not present

## 2022-09-14 DIAGNOSIS — Z7985 Long-term (current) use of injectable non-insulin antidiabetic drugs: Secondary | ICD-10-CM | POA: Diagnosis not present

## 2022-09-14 DIAGNOSIS — Z792 Long term (current) use of antibiotics: Secondary | ICD-10-CM | POA: Insufficient documentation

## 2022-09-14 DIAGNOSIS — Z89432 Acquired absence of left foot: Secondary | ICD-10-CM | POA: Insufficient documentation

## 2022-09-14 DIAGNOSIS — I739 Peripheral vascular disease, unspecified: Secondary | ICD-10-CM | POA: Diagnosis not present

## 2022-09-14 DIAGNOSIS — E1151 Type 2 diabetes mellitus with diabetic peripheral angiopathy without gangrene: Secondary | ICD-10-CM

## 2022-09-14 DIAGNOSIS — N189 Chronic kidney disease, unspecified: Secondary | ICD-10-CM | POA: Insufficient documentation

## 2022-09-14 DIAGNOSIS — E11621 Type 2 diabetes mellitus with foot ulcer: Secondary | ICD-10-CM | POA: Diagnosis not present

## 2022-09-14 DIAGNOSIS — Z9862 Peripheral vascular angioplasty status: Secondary | ICD-10-CM | POA: Insufficient documentation

## 2022-09-14 DIAGNOSIS — L97529 Non-pressure chronic ulcer of other part of left foot with unspecified severity: Secondary | ICD-10-CM | POA: Insufficient documentation

## 2022-09-14 DIAGNOSIS — Z794 Long term (current) use of insulin: Secondary | ICD-10-CM | POA: Insufficient documentation

## 2022-09-14 DIAGNOSIS — L089 Local infection of the skin and subcutaneous tissue, unspecified: Secondary | ICD-10-CM

## 2022-09-14 DIAGNOSIS — N1832 Chronic kidney disease, stage 3b: Secondary | ICD-10-CM

## 2022-09-14 NOTE — Patient Instructions (Signed)
You are here for follow up of left foot infection- you are on meropenem- you will be completing 30 days on 09/19/22- Tomorrow you are going for further debridement depending on the culture will switch you to po antibiotics for couple of weeks- will discuss with Dr.Fowler before we make the change
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
## 2022-09-14 NOTE — Pre-Procedure Instructions (Signed)
Patient instructions given over the phone. Patient was also made aware that someone needs to drive him home and a responsible adult needs to  stay with him 24 hours after surgery. Patient verbalized understanding.  In addition to ,patient is not able to pick up CHG soap and printed instructions at Pre admit office. So patient needs the full set of CHG at PRE -op.  Per patient, he had not taken his GLP -1 due to  pharmacy supply issue for seven days already . So Instructed to not take  it tomorrow before surgery. Patient verbalized understanding.

## 2022-09-14 NOTE — Patient Instructions (Addendum)
Your procedure is scheduled on: 09/15/2022  Report to the Registration Desk on the 1st floor of the [facility]. To find out your arrival time, please call [phone] between 1PM - 3PM on: 09/14/2022  If your arrival time is 6:00 am, do not arrive before that time as the [facility] entrance doors do not open until 6:00 am.  REMEMBER: Instructions that are not followed completely may result in serious medical risk, up to and including death; or upon the discretion of your surgeon and anesthesiologist your surgery may need to be rescheduled.  Do not eat food after midnight the night before surgery.  No gum chewing or hard candies.  You may however, drink CLEAR liquids up to 2 hours before you are scheduled to arrive for your surgery. Do not drink anything within 2 hours of your scheduled arrival time.  Clear liquids include: - water   In addition, your doctor has ordered for you to drink the provided:   Gatorade G2  Drinking this carbohydrate drink up to two hours before surgery helps to reduce insulin resistance and improve patient outcomes. Please complete drinking 2 hours before scheduled arrival time.  One week prior to surgery: Stop Anti-inflammatories (NSAIDS) such as Advil, Aleve, Ibuprofen, Motrin, Naproxen, Naprosyn and Aspirin based products such as Excedrin, Goody's Powder, BC Powder. Stop ANY OVER THE COUNTER supplements until after surgery. You may however, continue to take Tylenol if needed for pain up until the day of surgery.  Continue taking all prescribed medications except :     Eliquis- ask your surgeon or PCP     Plavix- ask your surgeon or PCP   Half dose of lantus at bedtime tonight   No Alcohol for 24 hours before or after surgery.  No Smoking including e-cigarettes for 24 hours before surgery.  No chewable tobacco products for at least 6 hours before surgery.  No nicotine patches on the day of surgery.  Do not use any "recreational" drugs for at least  a week (preferably 2 weeks) before your surgery.  Please be advised that the combination of cocaine and anesthesia may have negative outcomes, up to and including death. If you test positive for cocaine, your surgery will be cancelled.  On the morning of surgery brush your teeth with toothpaste and water, you may rinse your mouth with mouthwash if you wish. Do not swallow any toothpaste or mouthwash.  Use CHG Soap as directed on instruction sheet.  Do not wear jewelry, make-up, hairpins, clips or nail polish.  Do not wear lotions, powders, or perfumes.   Do not shave body hair from the neck down 48 hours before surgery.  Contact lenses, hearing aids and dentures may not be worn into surgery.  Do not bring valuables to the hospital. [facility] [place] is not responsible for any missing/lost belongings or valuables.   Notify your doctor if there is any change in your medical condition (cold, fever, infection).  Wear comfortable clothing (specific to your surgery type) to the hospital.  After surgery, you can help prevent lung complications by doing breathing exercises.  Take deep breaths and cough every 1-2 hours. Your doctor may order a device called an Incentive Spirometer to help you take deep breaths. If you are being admitted to the hospital overnight, leave your suitcase in the car. After surgery it may be brought to your room.   If you are being discharged the day of surgery, you will not be allowed to drive home. You will  need a responsible individual to drive you home and stay with you for 24 hours after surgery.    Please call the Pre-admissions Testing Dept. at [phone] if you have any questions about these instructions.  Surgery Visitation Policy:  Patients having surgery or a procedure may have two visitors.  Children under the age of 75 must have an adult with them who is not the patient.  Inpatient Visitation:    Visiting hours are 7 a.m. to 8 p.m. Up to four  visitors are allowed at one time in a patient room. The visitors may rotate out with other people during the day.  One visitor age 36 or older may stay with the patient overnight and must be in the room by 8 p.m.    Preparing the Skin Before Surgery     To help prevent the risk of infection at your surgical site, we are now providing you with rinse-free Sage 2% Chlorhexidine Gluconate (CHG) disposable wipes.  Chlorhexidine Gluconate (CHG) Soap  o An antiseptic cleaner that kills germs and bonds with the skin to continue killing germs even after washing  o Used for showering the night before surgery and morning of surgery  The night before surgery: Shower or bathe with warm water. Do not apply perfume, lotions, powders. Wait one hour after shower. Skin should be dry and cool. Open Sage wipe package - use 6 disposable cloths. Wipe body using one cloth for the right arm, one cloth for the left arm, one cloth for the right leg, one cloth for the left leg, one cloth for the chest/abdomen area, and one cloth for the back. Do not use on open wounds or sores. Do not use on face or genitals (private parts). If you are breast feeding, do not use on breasts. 5. Do not rinse, allow to dry. 6. Skin may feel "tacky" for several minutes. 7. Dress in clean clothes. 8. Place clean sheets on your bed and do not sleep with pets.  REPEAT ABOVE ON THE MORNING OF SURGERY BEFORE ARRIVING TO THE HOSPITAL.

## 2022-09-14 NOTE — Pre-Procedure Instructions (Signed)
Patient has been made aware of his arrival time in Pre op.  Pt verbalized understanding.

## 2022-09-15 ENCOUNTER — Ambulatory Visit: Payer: Managed Care, Other (non HMO) | Admitting: Urgent Care

## 2022-09-15 ENCOUNTER — Other Ambulatory Visit: Payer: Self-pay

## 2022-09-15 ENCOUNTER — Encounter: Payer: Self-pay | Admitting: Podiatry

## 2022-09-15 ENCOUNTER — Encounter
Admission: RE | Disposition: A | Discharge status: Home / Self Care | Payer: Self-pay | Source: Home / Self Care | Attending: Podiatry

## 2022-09-15 ENCOUNTER — Ambulatory Visit
Admission: RE | Admit: 2022-09-15 | Discharge: 2022-09-15 | Disposition: A | Discharge status: Home / Self Care | Payer: Managed Care, Other (non HMO) | Attending: Podiatry | Admitting: Podiatry

## 2022-09-15 DIAGNOSIS — I129 Hypertensive chronic kidney disease with stage 1 through stage 4 chronic kidney disease, or unspecified chronic kidney disease: Secondary | ICD-10-CM | POA: Diagnosis not present

## 2022-09-15 DIAGNOSIS — E1140 Type 2 diabetes mellitus with diabetic neuropathy, unspecified: Secondary | ICD-10-CM | POA: Diagnosis not present

## 2022-09-15 DIAGNOSIS — E559 Vitamin D deficiency, unspecified: Secondary | ICD-10-CM | POA: Insufficient documentation

## 2022-09-15 DIAGNOSIS — Z87891 Personal history of nicotine dependence: Secondary | ICD-10-CM | POA: Diagnosis not present

## 2022-09-15 DIAGNOSIS — I2510 Atherosclerotic heart disease of native coronary artery without angina pectoris: Secondary | ICD-10-CM | POA: Diagnosis not present

## 2022-09-15 DIAGNOSIS — Z951 Presence of aortocoronary bypass graft: Secondary | ICD-10-CM | POA: Diagnosis not present

## 2022-09-15 DIAGNOSIS — E1122 Type 2 diabetes mellitus with diabetic chronic kidney disease: Secondary | ICD-10-CM | POA: Insufficient documentation

## 2022-09-15 DIAGNOSIS — M86672 Other chronic osteomyelitis, left ankle and foot: Secondary | ICD-10-CM | POA: Diagnosis not present

## 2022-09-15 DIAGNOSIS — N1832 Chronic kidney disease, stage 3b: Secondary | ICD-10-CM

## 2022-09-15 DIAGNOSIS — T8781 Dehiscence of amputation stump: Secondary | ICD-10-CM | POA: Diagnosis present

## 2022-09-15 DIAGNOSIS — E7800 Pure hypercholesterolemia, unspecified: Secondary | ICD-10-CM | POA: Diagnosis not present

## 2022-09-15 DIAGNOSIS — N189 Chronic kidney disease, unspecified: Secondary | ICD-10-CM | POA: Diagnosis not present

## 2022-09-15 HISTORY — PX: INCISION AND DRAINAGE OF WOUND: SHX1803

## 2022-09-15 HISTORY — PX: WOUND DEBRIDEMENT: SHX247

## 2022-09-15 LAB — GLUCOSE, CAPILLARY
Glucose-Capillary: 100 mg/dL — ABNORMAL HIGH (ref 70–99)
Glucose-Capillary: 79 mg/dL (ref 70–99)

## 2022-09-15 SURGERY — DEBRIDEMENT, WOUND
Anesthesia: General | Site: Foot | Laterality: Left

## 2022-09-15 MED ORDER — METOCLOPRAMIDE HCL 5 MG/ML IJ SOLN
5.0000 mg | Freq: Three times a day (TID) | INTRAMUSCULAR | Status: DC | PRN
Start: 2022-09-15 — End: 2022-09-15

## 2022-09-15 MED ORDER — LIDOCAINE HCL (PF) 1 % IJ SOLN
INTRAMUSCULAR | Status: AC
  Filled 2022-09-15: qty 30

## 2022-09-15 MED ORDER — MIDAZOLAM HCL 2 MG/2ML IJ SOLN
INTRAMUSCULAR | Status: DC | PRN
  Administered 2022-09-15 (×2): 1 mg via INTRAVENOUS

## 2022-09-15 MED ORDER — CEFAZOLIN SODIUM-DEXTROSE 2-4 GM/100ML-% IV SOLN
INTRAVENOUS | Status: AC
  Filled 2022-09-15: qty 100

## 2022-09-15 MED ORDER — FENTANYL CITRATE (PF) 100 MCG/2ML IJ SOLN
INTRAMUSCULAR | Status: AC
  Filled 2022-09-15: qty 2

## 2022-09-15 MED ORDER — ORAL CARE MOUTH RINSE: 15.0000 mL | Freq: Once | OROMUCOSAL | Status: AC

## 2022-09-15 MED ORDER — 0.9 % SODIUM CHLORIDE (POUR BTL) OPTIME
TOPICAL | Status: DC | PRN
  Administered 2022-09-15: 500 mL

## 2022-09-15 MED ORDER — CEFAZOLIN SODIUM-DEXTROSE 2-4 GM/100ML-% IV SOLN
2.0000 g | INTRAVENOUS | Status: AC
  Administered 2022-09-15: 2 g via INTRAVENOUS

## 2022-09-15 MED ORDER — OXYCODONE HCL 5 MG/5ML PO SOLN: 5.0000 mg | Freq: Once | ORAL | Status: DC | PRN

## 2022-09-15 MED ORDER — SODIUM CHLORIDE 0.9 % IV SOLN: INTRAVENOUS | Status: DC

## 2022-09-15 MED ORDER — ONDANSETRON HCL 4 MG/2ML IJ SOLN: 4.0000 mg | Freq: Four times a day (QID) | INTRAMUSCULAR | Status: DC | PRN

## 2022-09-15 MED ORDER — PROPOFOL 10 MG/ML IV BOLUS
INTRAVENOUS | Status: AC
  Filled 2022-09-15: qty 20

## 2022-09-15 MED ORDER — CHLORHEXIDINE GLUCONATE 0.12 % MT SOLN
OROMUCOSAL | Status: AC
  Filled 2022-09-15: qty 15

## 2022-09-15 MED ORDER — BUPIVACAINE HCL (PF) 0.5 % IJ SOLN
INTRAMUSCULAR | Status: AC
  Filled 2022-09-15: qty 30

## 2022-09-15 MED ORDER — FAMOTIDINE 20 MG PO TABS
ORAL_TABLET | ORAL | Status: AC
  Filled 2022-09-15: qty 1

## 2022-09-15 MED ORDER — LIDOCAINE-EPINEPHRINE 1 %-1:100000 IJ SOLN
INTRAMUSCULAR | Status: DC | PRN
  Administered 2022-09-15: 19 mL via INTRAMUSCULAR

## 2022-09-15 MED ORDER — CHLORHEXIDINE GLUCONATE 0.12 % MT SOLN
15.0000 mL | Freq: Once | OROMUCOSAL | Status: AC
  Administered 2022-09-15: 15 mL via OROMUCOSAL

## 2022-09-15 MED ORDER — PROPOFOL 500 MG/50ML IV EMUL
INTRAVENOUS | Status: DC | PRN
  Administered 2022-09-15: 100 ug/kg/min via INTRAVENOUS

## 2022-09-15 MED ORDER — MIDAZOLAM HCL 2 MG/2ML IJ SOLN
INTRAMUSCULAR | Status: AC
  Filled 2022-09-15: qty 2

## 2022-09-15 MED ORDER — LIDOCAINE-EPINEPHRINE 1 %-1:100000 IJ SOLN
INTRAMUSCULAR | Status: AC
  Filled 2022-09-15: qty 1

## 2022-09-15 MED ORDER — OXYCODONE HCL 5 MG PO TABS: 5.0000 mg | ORAL_TABLET | Freq: Once | ORAL | Status: DC | PRN

## 2022-09-15 MED ORDER — PROPOFOL 10 MG/ML IV BOLUS
INTRAVENOUS | Status: DC | PRN
  Administered 2022-09-15: 30 mg via INTRAVENOUS

## 2022-09-15 MED ORDER — METOCLOPRAMIDE HCL 10 MG PO TABS
5.0000 mg | ORAL_TABLET | Freq: Three times a day (TID) | ORAL | Status: DC | PRN
Start: 2022-09-15 — End: 2022-09-15

## 2022-09-15 MED ORDER — ONDANSETRON HCL 4 MG PO TABS: 4.0000 mg | ORAL_TABLET | Freq: Four times a day (QID) | ORAL | Status: DC | PRN

## 2022-09-15 MED ORDER — FAMOTIDINE 20 MG PO TABS
20.0000 mg | ORAL_TABLET | Freq: Once | ORAL | Status: AC
  Administered 2022-09-15: 20 mg via ORAL

## 2022-09-15 MED ORDER — LIDOCAINE HCL (PF) 2 % IJ SOLN
INTRAMUSCULAR | Status: AC
  Filled 2022-09-15: qty 5

## 2022-09-15 MED ORDER — FENTANYL CITRATE (PF) 100 MCG/2ML IJ SOLN: 25.0000 ug | INTRAMUSCULAR | Status: DC | PRN

## 2022-09-15 SURGICAL SUPPLY — 77 items
BLADE MED AGGRESSIVE (BLADE) IMPLANT
BLADE OSC/SAGITTAL MD 5.5X18 (BLADE) IMPLANT
BLADE OSC/SAGITTAL MD 9X18.5 (BLADE) IMPLANT
BLADE OSCILLATING/SAGITTAL (BLADE)
BLADE SURG 15 STRL LF DISP TIS (BLADE) ×2 IMPLANT
BLADE SURG 15 STRL SS (BLADE) ×2
BLADE SW THK.38XMED LNG THN (BLADE) IMPLANT
BNDG CMPR 5X4 CHSV STRCH STRL (GAUZE/BANDAGES/DRESSINGS) ×2
BNDG CMPR 5X4 KNIT ELC UNQ LF (GAUZE/BANDAGES/DRESSINGS) ×2
BNDG CMPR 5X6 CHSV STRCH STRL (GAUZE/BANDAGES/DRESSINGS) ×2
BNDG CMPR 75X21 PLY HI ABS (MISCELLANEOUS) ×2
BNDG CMPR STD VLCR NS LF 5.8X6 (GAUZE/BANDAGES/DRESSINGS)
BNDG COHESIVE 4X5 TAN STRL LF (GAUZE/BANDAGES/DRESSINGS) ×2 IMPLANT
BNDG COHESIVE 6X5 TAN ST LF (GAUZE/BANDAGES/DRESSINGS) ×2 IMPLANT
BNDG ELASTIC 4INX 5YD STR LF (GAUZE/BANDAGES/DRESSINGS) ×2 IMPLANT
BNDG ELASTIC 6X5.8 VLCR NS LF (GAUZE/BANDAGES/DRESSINGS) IMPLANT
BNDG ESMARCH 4 X 12 STRL LF (GAUZE/BANDAGES/DRESSINGS) ×2
BNDG ESMARCH 4X12 STRL LF (GAUZE/BANDAGES/DRESSINGS) ×2 IMPLANT
BNDG GAUZE DERMACEA FLUFF 4 (GAUZE/BANDAGES/DRESSINGS) ×2 IMPLANT
BNDG GZE 12X3 1 PLY HI ABS (GAUZE/BANDAGES/DRESSINGS) ×2
BNDG GZE DERMACEA 4 6PLY (GAUZE/BANDAGES/DRESSINGS) ×2
BNDG STRETCH GAUZE 3IN X12FT (GAUZE/BANDAGES/DRESSINGS) ×2 IMPLANT
CANISTER WOUND CARE 500ML ATS (WOUND CARE) ×2 IMPLANT
CUFF TOURN SGL QUICK 12 (TOURNIQUET CUFF) IMPLANT
CUFF TOURN SGL QUICK 18X4 (TOURNIQUET CUFF) IMPLANT
DRAPE FLUOR MINI C-ARM 54X84 (DRAPES) IMPLANT
DRAPE XRAY CASSETTE 23X24 (DRAPES) IMPLANT
DRSG MEPILEX FLEX 3X3 (GAUZE/BANDAGES/DRESSINGS) IMPLANT
DURAPREP 26ML APPLICATOR (WOUND CARE) ×2 IMPLANT
ELECT REM PT RETURN 9FT ADLT (ELECTROSURGICAL) ×2
ELECTRODE REM PT RTRN 9FT ADLT (ELECTROSURGICAL) ×2 IMPLANT
GAUZE PACKING 0.25INX5YD STRL (GAUZE/BANDAGES/DRESSINGS) ×2 IMPLANT
GAUZE SPONGE 4X4 12PLY STRL (GAUZE/BANDAGES/DRESSINGS) ×2 IMPLANT
GAUZE STRETCH 2X75IN STRL (MISCELLANEOUS) ×2 IMPLANT
GAUZE XEROFORM 1X8 LF (GAUZE/BANDAGES/DRESSINGS) ×2 IMPLANT
GLOVE BIO SURGEON STRL SZ7.5 (GLOVE) ×2 IMPLANT
GLOVE INDICATOR 8.0 STRL GRN (GLOVE) ×2 IMPLANT
GOWN STRL REUS W/ TWL LRG LVL3 (GOWN DISPOSABLE) ×2 IMPLANT
GOWN STRL REUS W/ TWL XL LVL3 (GOWN DISPOSABLE) ×4 IMPLANT
GOWN STRL REUS W/TWL LRG LVL3 (GOWN DISPOSABLE) ×2
GOWN STRL REUS W/TWL MED LVL3 (GOWN DISPOSABLE) ×4 IMPLANT
GOWN STRL REUS W/TWL XL LVL3 (GOWN DISPOSABLE) ×4
HANDPIECE VERSAJET DEBRIDEMENT (MISCELLANEOUS) IMPLANT
IV NS 1000ML (IV SOLUTION) ×2
IV NS 1000ML BAXH (IV SOLUTION) ×2 IMPLANT
IV NS IRRIG 3000ML ARTHROMATIC (IV SOLUTION) ×2 IMPLANT
KIT DRSG VAC SLVR GRANUFM (MISCELLANEOUS) ×2 IMPLANT
KIT TURNOVER KIT A (KITS) ×2 IMPLANT
LABEL OR SOLS (LABEL) ×2 IMPLANT
MANIFOLD NEPTUNE II (INSTRUMENTS) ×2 IMPLANT
NDL FILTER BLUNT 18X1 1/2 (NEEDLE) ×2 IMPLANT
NDL HYPO 25X1 1.5 SAFETY (NEEDLE) ×2 IMPLANT
NEEDLE FILTER BLUNT 18X1 1/2 (NEEDLE) ×2 IMPLANT
NEEDLE HYPO 25X1 1.5 SAFETY (NEEDLE) ×2 IMPLANT
NS IRRIG 500ML POUR BTL (IV SOLUTION) ×2 IMPLANT
PACK EXTREMITY ARMC (MISCELLANEOUS) ×2 IMPLANT
PACKING GAUZE IODOFORM 1INX5YD (GAUZE/BANDAGES/DRESSINGS) ×2 IMPLANT
PAD ABD DERMACEA PRESS 5X9 (GAUZE/BANDAGES/DRESSINGS) ×2 IMPLANT
PULSAVAC PLUS IRRIG FAN TIP (DISPOSABLE)
RASP SM TEAR CROSS CUT (RASP) IMPLANT
SHIELD FULL FACE ANTIFOG 7M (MISCELLANEOUS) ×2 IMPLANT
SOL PREP PVP 2OZ (MISCELLANEOUS) ×2
SOLUTION PREP PVP 2OZ (MISCELLANEOUS) ×2 IMPLANT
STOCKINETTE IMPERVIOUS 9X36 MD (GAUZE/BANDAGES/DRESSINGS) ×2 IMPLANT
SUT ETHILON 2 0 FS 18 (SUTURE) ×4 IMPLANT
SUT ETHILON 4-0 (SUTURE)
SUT ETHILON 4-0 FS2 18XMFL BLK (SUTURE)
SUT VIC AB 3-0 SH 27 (SUTURE) ×2
SUT VIC AB 3-0 SH 27X BRD (SUTURE) ×2 IMPLANT
SUT VIC AB 4-0 FS2 27 (SUTURE) ×2 IMPLANT
SUTURE ETHLN 4-0 FS2 18XMF BLK (SUTURE) ×2 IMPLANT
SWAB CULTURE AMIES ANAERIB BLU (MISCELLANEOUS) IMPLANT
SYR 10ML LL (SYRINGE) ×4 IMPLANT
SYR 3ML LL SCALE MARK (SYRINGE) ×2 IMPLANT
TIP FAN IRRIG PULSAVAC PLUS (DISPOSABLE) ×2 IMPLANT
TRAP FLUID SMOKE EVACUATOR (MISCELLANEOUS) ×2 IMPLANT
WATER STERILE IRR 500ML POUR (IV SOLUTION) ×2 IMPLANT
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
## 2022-09-15 NOTE — Anesthesia Preprocedure Evaluation (Signed)
Anesthesia Evaluation  Patient identified by MRN, date of birth, ID band Patient awake    Reviewed: Allergy & Precautions, NPO status , Patient's Chart, lab work & pertinent test results  History of Anesthesia Complications Negative for: history of anesthetic complications  Airway Mallampati: III  TM Distance: <3 FB Neck ROM: full    Dental  (+) Chipped, Poor Dentition   Pulmonary neg shortness of breath, former smoker   Pulmonary exam normal        Cardiovascular Exercise Tolerance: Good hypertension, (-) angina + CAD  Normal cardiovascular exam     Neuro/Psych  Neuromuscular disease  negative psych ROS   GI/Hepatic negative GI ROS, Neg liver ROS,neg GERD  ,,  Endo/Other  diabetes, Type 2    Renal/GU Renal disease  negative genitourinary   Musculoskeletal   Abdominal   Peds  Hematology negative hematology ROS (+)   Anesthesia Other Findings Past Medical History: No date: Bladder neck obstruction No date: Chronic kidney disease No date: Coronary artery disease     Comment:  a.) s/p 4v CABG in 2014 No date: Diabetes mellitus without complication (HCC) No date: Diabetic neuropathy (HCC) No date: Diabetic peripheral neuropathy (HCC) No date: Diverticulosis No date: Gout No date: Heart murmur No date: Hypercholesteremia No date: Hyperlipidemia No date: Hypertension No date: Peripheral neuropathy 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 No date: Tubular adenoma No date: Vitamin D deficiency  Past Surgical History: 08/19/2022: AMPUTATION; Left     Comment:  Procedure: TRANSMETATARSAL AMPUTATION LEFT FOOT WITH               IRRIGATION AND DEBRIDEMENT;  Surgeon: Rosetta Posner, DPM;              Location: [facility];  Service: Podiatry;  Laterality:               Left; 06/01/2015: AMPUTATION TOE; Right     Comment:  Procedure: AMPUTATION TOE;  Surgeon: Recardo Evangelist,               DPM;  Location: [facility];  Service: Podiatry;                 Laterality: Right; 08/11/2022: AMPUTATION TOE; Left     Comment:  Procedure: AMPUTATION TOE 2, 3, 4;  Surgeon: [name], DPM;  Location: [facility];  Service: Podiatry;                Laterality: Left; No date: CATARACT EXTRACTION, BILATERAL 06/12/2022: CIRCUMCISION; N/A     Comment:  Procedure: CIRCUMCISION ADULT;  Surgeon: Vanna Scotland, MD;  Location: [facility];  Service: Urology;                Laterality: N/A; 02/14/2016: COLONOSCOPY WITH PROPOFOL; N/A     Comment:  Procedure: COLONOSCOPY WITH PROPOFOL;  Surgeon: Christena Deem, MD;  Location: [facility] ENDOSCOPY;  Service:               Endoscopy;  Laterality: N/A; 01/07/2019: COLONOSCOPY WITH PROPOFOL; N/A     Comment:  Procedure: COLONOSCOPY WITH PROPOFOL;  Surgeon:               Christena Deem, MD;  Location: [facility]OSCOPY;  Service: Endoscopy;  Laterality: N/A; 08/2012: CORONARY ARTERY BYPASS GRAFT; N/A 06/01/2015: EXCISION PARTIAL PHALANX; Right     Comment:  Procedure: EXCISION PARTIAL PHALANX /  BONE;  Surgeon:               Recardo Evangelist, DPM;  Location: [facility];  Service:               Podiatry;  Laterality: Right; 05/29/2016: FLEXIBLE SIGMOIDOSCOPY; N/A     Comment:  Procedure: FLEXIBLE SIGMOIDOSCOPY;  Surgeon: Christena Deem, MD;  Location: [facility] ENDOSCOPY;  Service:               Endoscopy;  Laterality: N/A; 08/23/2022: INCISION AND DRAINAGE; Left     Comment:  Procedure: INCISION AND DRAINAGE;  Surgeon: [name], DPM;  Location: [facility];  Service: Podiatry;                Laterality: Left; No date: KNEE ARTHROSCOPY; Left 08/22/2022: LOWER EXTREMITY ANGIOGRAPHY; Left     Comment:  Procedure: Lower Extremity Angiography;  Surgeon:               Renford Dills, MD;  Location: [facility] INVASIVE CV LAB;               Service: Cardiovascular;  Laterality: Left;     Reproductive/Obstetrics negative OB  ROS                             Anesthesia Physical Anesthesia Plan  ASA: 3  Anesthesia Plan: General   Post-op Pain Management:    Induction: Intravenous  PONV Risk Score and Plan: Propofol infusion and TIVA  Airway Management Planned: Natural Airway and Nasal Cannula  Additional Equipment:   Intra-op Plan:   Post-operative Plan:   Informed Consent: I have reviewed the patients History and Physical, chart, labs and discussed the procedure including the risks, benefits and alternatives for the proposed anesthesia with the patient or authorized representative who has indicated his/her understanding and acceptance.     Dental Advisory Given  Plan Discussed with: Anesthesiologist, CRNA and Surgeon  Anesthesia Plan Comments: (Patient consented for risks of anesthesia including but not limited to:  - adverse reactions to medications - risk of airway placement if required - damage to eyes, teeth, lips or other oral mucosa - nerve damage due to positioning  - sore throat or hoarseness - Damage to heart, brain, nerves, lungs, other parts of body or loss of life  Patient voiced understanding.)       Anesthesia Quick Evaluation

## 2022-09-15 NOTE — Op Note (Signed)
Operative note   Surgeon:Jakeim Sedore Armed forces logistics/support/administrative officer: None    Preop diagnosis: 1.  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  Residual medial arch wound left foot 3.  Superficial wound dehiscence left medial ankle    Postop diagnosis: Same    Procedure: 1.  Excision bone first metatarsal left foot 2.  Delayed primary closure distal transmetatarsal amputation incision site 3.  Debridement with delayed primary closure left medial arch 4.  Delayed primary closure left medial ankle    EBL: Minimal    Anesthesia:local and IV sedation.  Local consists of a total of 20 cc of a one-to-one mixture of 1% lidocaine with epinephrine and 0.5% bupivacaine plain    Hemostasis: Lidocaine with epinephrine infiltrated along the incision sites    Specimen: Deep wound culture and bone for pathology    Complications: None    Operative indications:Andrew Harding is an 72 y.o. that presents today for surgical intervention.  The risks/benefits/alternatives/complications have been discussed and consent has been given.    Procedure:  Patient was brought into the OR and placed on the operating table in thesupine position. After anesthesia was obtained theleft lower extremity was prepped and draped in usual sterile fashion.  Attention was directed to the distal aspect of the left forefoot where the previous transmetatarsal amputation site was noted.  There was noted to be a wound dehiscence to the medial one third of the incision.  This was then opened.  All sutures were removed.  At this time fibrotic nonviable tissue was noted within the wound.  A superficial area of the skin flap was noted to be necrotic.  At this time all areas were debrided with the use of a Versajet down to the level of bone.  All nonviable tissue was removed.  A portion of the medial skin flap was excised as well.  At this time there was noted to be prominent first metatarsal pressing against the distal medial flap.   At this time I removed a small amount approximately 1 cm of the residual distal first metatarsal down to healthy bleeding bone.  This bone was sent for pathological examination.  A deep wound culture was performed within the skin flap site.  After all bleeders were Bovie cauterized.  No further necrotic tissue was noted.  Closure of this area was performed with a 3-0 Vicryl for the deeper tissue and a 2-0 nylon for the skin and layers.  The left medial arch was noted with the previous open dehisced area.  The most distal aspect had a deep area of fibrotic and fascia extruding through the incision site.  This was excised down to the level of fascia.  The edges were debrided as well.  Closure was then performed with a 2-0 nylon.  The medial ankle incision was noted and debrided with a curette.  This was then primarily closed with a 2-0 nylon.    Patient tolerated the procedure and anesthesia well.  Was transported from the OR to the PACU with all vital signs stable and vascular status intact. To be discharged per routine protocol.  Will follow up in approximately 1 week in the outpatient clinic.

## 2022-09-15 NOTE — Anesthesia Procedure Notes (Signed)
Procedure Name: MAC Date/Time: 09/15/2022 1:40 PM  Performed by: Elmarie Mainland, CRNAPre-anesthesia Checklist: Patient identified, Emergency Drugs available, Suction available and Patient being monitored Patient Re-evaluated:Patient Re-evaluated prior to induction Oxygen Delivery Method: Simple face mask Preoxygenation: Pre-oxygenation with 100% oxygen

## 2022-09-15 NOTE — Discharge Instructions (Addendum)
[facility]  POST OPERATIVE INSTRUCTIONS FOR DR. Ether Griffins AND DR. BAKER [facility] CLINIC PODIATRY DEPARTMENT   Take your medication as prescribed.  Pain medication should be taken only as needed.  Keep the dressing clean, dry and intact.  Okay to begin dressing changes in 2 days.  Return to your normal dressing changes.  Keep your foot elevated above the heart level for the first 48 hours.  We have instructed you to be non-weight bearing.  Always wear your post-op shoe when walking.  Always use your crutches if you are to be non-weight bearing.  Do not take a shower. Baths are permissible as long as the foot is kept out of the water.   Every hour you are awake:  Bend your knee 15 times. Flex foot 15 times Massage calf 15 times  Call [facility] [phone]) if any of the following problems occur: You develop a temperature or fever. The bandage becomes saturated with blood. Medication does not stop your pain. Injury of the foot occurs. Any symptoms of infection including redness, odor, or red streaks running from wound.   AMBULATORY SURGERY  DISCHARGE INSTRUCTIONS   The drugs that you were given will stay in your system until tomorrow so for the next 24 hours you should not:  Drive an automobile Make any legal decisions Drink any alcoholic beverage   You may resume regular meals tomorrow.  Today it is better to start with liquids and gradually work up to solid foods.  You may eat anything you prefer, but it is better to start with liquids, then soup and crackers, and gradually work up to solid foods.   Please notify your doctor immediately if you have any unusual bleeding, trouble breathing, redness and pain at the surgery site, drainage, fever, or pain not relieved by medication.    Additional Instructions:        Please contact your physician with any problems or Same Day Surgery at [phone], Monday through  Friday 6 am to 4 pm, or [place] [facility] number at [phone].

## 2022-09-15 NOTE — Transfer of Care (Signed)
Immediate Anesthesia Transfer of Care Note  [name]  Procedure(s) Performed: 27253 - DEBRIDE SKIN. MUSCLE FASCIA (Left: Foot) 11044 - DEBRIDE BONE and EXCISION IF 1ST METATARSAL BONE WITH  DELAY PRIMARY CLOSURE (Left)  Patient Location: PACU  Anesthesia Type:General  Level of Consciousness: awake, drowsy, and patient cooperative  Airway & Oxygen Therapy: Patient Spontanous Breathing and Patient connected to face mask oxygen  Post-op Assessment: Report given to RN and Post -op Vital signs reviewed and stable  Post vital signs: Reviewed and stable  Last Vitals:  Vitals Value Taken Time  BP 105/83 09/15/22 1450  Temp    Pulse 91 09/15/22 1454  Resp 19 09/15/22 1454  SpO2 100 % 09/15/22 1454  Vitals shown include unvalidated device data.  Last Pain:  Vitals:   09/15/22 1058  TempSrc: Temporal  PainSc: 0-No pain         Complications: No notable events documented.

## 2022-09-15 NOTE — Anesthesia Postprocedure Evaluation (Signed)
Anesthesia Post Note  [name]  Procedure(s) Performed: 16109 - DEBRIDE SKIN. MUSCLE FASCIA (Left: Foot) 11044 - DEBRIDE BONE and EXCISION IF 1ST METATARSAL BONE WITH  DELAY PRIMARY CLOSURE (Left)  Patient location during evaluation: PACU Anesthesia Type: General Level of consciousness: awake and alert Pain management: pain level controlled Vital Signs Assessment: post-procedure vital signs reviewed and stable Respiratory status: spontaneous breathing, nonlabored ventilation, respiratory function stable and patient connected to nasal cannula oxygen Cardiovascular status: blood pressure returned to baseline and stable Postop Assessment: no apparent nausea or vomiting Anesthetic complications: no   No notable events documented.   Last Vitals:  Vitals:   09/15/22 1500 09/15/22 1515  BP: 110/65 113/71  Pulse: 68 71  Resp: 14 16  Temp:  (!) 36.3 C  SpO2: 100% 100%    Last Pain:  Vitals:   09/15/22 1515  TempSrc:   PainSc: 0-No pain                 Cleda Mccreedy Raymie Trani

## 2022-09-16 ENCOUNTER — Encounter: Payer: Self-pay | Admitting: Podiatry

## 2022-09-16 LAB — AEROBIC/ANAEROBIC CULTURE W GRAM STAIN (SURGICAL/DEEP WOUND): Culture: NO GROWTH

## 2022-09-18 LAB — AEROBIC/ANAEROBIC CULTURE W GRAM STAIN (SURGICAL/DEEP WOUND)

## 2022-09-19 ENCOUNTER — Telehealth: Payer: Self-pay

## 2022-09-19 ENCOUNTER — Inpatient Hospital Stay: Payer: BC Managed Care – PPO | Admitting: Infectious Diseases

## 2022-09-19 ENCOUNTER — Other Ambulatory Visit: Payer: Self-pay | Admitting: Infectious Diseases

## 2022-09-19 MED ORDER — AMOXICILLIN-POT CLAVULANATE 875-125 MG PO TABS: 1.0000 | ORAL_TABLET | Freq: Two times a day (BID) | ORAL | 0 refills | Status: DC

## 2022-09-19 MED ORDER — CIPROFLOXACIN HCL 500 MG PO TABS: 500.0000 mg | ORAL_TABLET | Freq: Two times a day (BID) | ORAL | 0 refills | Status: DC

## 2022-09-19 NOTE — Telephone Encounter (Signed)
Per Dr.Ravishankar - Pull PICC line after last dose IV meropenem on 09/20/2022. Orders sent to Jeri Modena, RN and [facility] staff.    Patient aware to start Ciprofloxacin and Augmentin on 09/21/2022. RX's sent to [facility] pharmacy.    Andrew Harding, CMA

## 2022-09-19 NOTE — Progress Notes (Signed)
Meropenem finishing today- will do cipro 500mg  PO BID and augmentin 875 mg PO BID for 2 weeks for the residual infection of the foot

## 2022-09-20 LAB — SURGICAL PATHOLOGY

## 2022-09-20 LAB — AEROBIC/ANAEROBIC CULTURE W GRAM STAIN (SURGICAL/DEEP WOUND): Gram Stain: NONE SEEN

## 2022-09-21 ENCOUNTER — Encounter: Payer: BC Managed Care – PPO | Admitting: Physical Medicine & Rehabilitation

## 2022-09-22 ENCOUNTER — Other Ambulatory Visit (INDEPENDENT_AMBULATORY_CARE_PROVIDER_SITE_OTHER): Payer: Self-pay | Admitting: Nurse Practitioner

## 2022-09-22 DIAGNOSIS — Z9889 Other specified postprocedural states: Secondary | ICD-10-CM

## 2022-09-28 ENCOUNTER — Ambulatory Visit (INDEPENDENT_AMBULATORY_CARE_PROVIDER_SITE_OTHER): Payer: Managed Care, Other (non HMO)

## 2022-09-28 ENCOUNTER — Ambulatory Visit (INDEPENDENT_AMBULATORY_CARE_PROVIDER_SITE_OTHER): Payer: Managed Care, Other (non HMO) | Admitting: Nurse Practitioner

## 2022-09-28 ENCOUNTER — Encounter (INDEPENDENT_AMBULATORY_CARE_PROVIDER_SITE_OTHER): Payer: Self-pay | Admitting: Nurse Practitioner

## 2022-09-28 VITALS — BP 100/67 | HR 54 | Resp 16

## 2022-09-28 DIAGNOSIS — I739 Peripheral vascular disease, unspecified: Secondary | ICD-10-CM

## 2022-09-28 DIAGNOSIS — I10 Essential (primary) hypertension: Secondary | ICD-10-CM | POA: Diagnosis not present

## 2022-09-28 DIAGNOSIS — E1122 Type 2 diabetes mellitus with diabetic chronic kidney disease: Secondary | ICD-10-CM | POA: Diagnosis not present

## 2022-09-28 DIAGNOSIS — Z9889 Other specified postprocedural states: Secondary | ICD-10-CM

## 2022-09-28 DIAGNOSIS — N1832 Chronic kidney disease, stage 3b: Secondary | ICD-10-CM

## 2022-09-28 DIAGNOSIS — Z794 Long term (current) use of insulin: Secondary | ICD-10-CM

## 2022-09-28 NOTE — Progress Notes (Incomplete)
Subjective:    Patient ID: Andrew Harding, male    DOB: [birth year], 72 y.o.   MRN: [id number] Chief Complaint  Patient presents with  . Follow-up    Ultrasound follow up    HPI  Review of Systems     Objective:   Physical Exam  BP 100/67 (BP Location: Left Arm)   Pulse (!) 54   Resp 16   Past Medical History:  Diagnosis Date  . Bladder neck obstruction   . Chronic kidney disease   . Coronary artery disease    a.) s/p 4v CABG in 2014  . Diabetes mellitus without complication (HCC)   . Diabetic neuropathy (HCC)   . Diabetic peripheral neuropathy (HCC)   . Diverticulosis   . Gout   . Heart murmur   . Hypercholesteremia   . Hyperlipidemia   . Hypertension   . Peripheral neuropathy   . 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 Tubular adenoma   . Vitamin D deficiency     Social History   Socioeconomic History  . Marital status: Married    Spouse name: Ashley,Angela C  . Number of children: Not on file  . Years of education: Not on file  . Highest education level: Not on file  Occupational History  . Not on file  Tobacco Use  . Smoking status: Former    Types: Cigars  . Smokeless tobacco: Never  Vaping Use  . Vaping Use: Never used  Substance and Sexual Activity  . Alcohol use: No  . Drug use: No  . Sexual activity: Never  Other Topics Concern  . Not on file  Social History Narrative   Patient is legally separated and lives at home by himself. His ex -wife is his HCPOA. Neighbors and friends are his support system. He used to work for [employer]    Social Determinants of Health   Financial Resource Strain: Not on file  Food Insecurity: No Food Insecurity (08/19/2022)   Hunger Vital Sign   . Worried About Programme researcher, broadcasting/film/video in the Last Year: Never true   . Ran Out of Food in the Last Year: Never true  Transportation Needs: No Transportation Needs (08/19/2022)   PRAPARE - Transportation   . Lack of Transportation (Medical): No   . Lack of  Transportation (Non-Medical): No  Physical Activity: Not on file  Stress: Not on file  Social Connections: Not on file  Intimate Partner Violence: Not At Risk (08/19/2022)   Humiliation, Afraid, Rape, and Kick questionnaire   . Fear of Current or Ex-Partner: No   . Emotionally Abused: No   . Physically Abused: No   . Sexually Abused: No    Past Surgical History:  Procedure Laterality Date  . AMPUTATION Left 08/19/2022   Procedure: TRANSMETATARSAL AMPUTATION LEFT FOOT WITH IRRIGATION AND DEBRIDEMENT;  Surgeon: Rosetta Posner, DPM;  Location: [facility] ORS;  Service: Podiatry;  Laterality: Left;  . AMPUTATION TOE Right 06/01/2015   Procedure: AMPUTATION TOE;  Surgeon: Recardo Evangelist, DPM;  Location: [facility] ORS;  Service: Podiatry;  Laterality: Right;  . AMPUTATION TOE Left 08/11/2022   Procedure: AMPUTATION TOE 2, 3, 4;  Surgeon: [name], DPM;  Location: [facility] ORS;  Service: Podiatry;  Laterality: Left;  . CATARACT EXTRACTION, BILATERAL    . CIRCUMCISION N/A 06/12/2022   Procedure: CIRCUMCISION ADULT;  Surgeon: Vanna Scotland, MD;  Location: [facility] ORS;  Service: Urology;  Laterality: N/A;  . COLONOSCOPY WITH PROPOFOL N/A 02/14/2016  Procedure: COLONOSCOPY WITH PROPOFOL;  Surgeon: Christena Deem, MD;  Location: [facility] ENDOSCOPY;  Service: Endoscopy;  Laterality: N/A;  . COLONOSCOPY WITH PROPOFOL N/A 01/07/2019   Procedure: COLONOSCOPY WITH PROPOFOL;  Surgeon: Christena Deem, MD;  Location: [facility] ENDOSCOPY;  Service: Endoscopy;  Laterality: N/A;  . CORONARY ARTERY BYPASS GRAFT N/A 08/2012  . EXCISION PARTIAL PHALANX Right 06/01/2015   Procedure: EXCISION PARTIAL PHALANX /  BONE;  Surgeon: Recardo Evangelist, DPM;  Location: [facility] ORS;  Service: Podiatry;  Laterality: Right;  . FLEXIBLE SIGMOIDOSCOPY N/A 05/29/2016   Procedure: FLEXIBLE SIGMOIDOSCOPY;  Surgeon: Christena Deem, MD;  Location: [facility] ENDOSCOPY;  Service: Endoscopy;  Laterality: N/A;  . INCISION AND DRAINAGE Left 08/23/2022    Procedure: INCISION AND DRAINAGE;  Surgeon: [name], DPM;  Location: [facility] ORS;  Service: Podiatry;  Laterality: Left;  . INCISION AND DRAINAGE OF WOUND Left 09/15/2022   Procedure: 11044 - DEBRIDE BONE and EXCISION IF 1ST METATARSAL BONE WITH  DELAY PRIMARY CLOSURE;  Surgeon: [name], DPM;  Location: [facility] ORS;  Service: Podiatry;  Laterality: Left;  . KNEE ARTHROSCOPY Left   . LOWER EXTREMITY ANGIOGRAPHY Left 08/22/2022   Procedure: Lower Extremity Angiography;  Surgeon: Renford Dills, MD;  Location: [facility] INVASIVE CV LAB;  Service: Cardiovascular;  Laterality: Left;  . WOUND DEBRIDEMENT Left 09/15/2022   Procedure: 11043 - DEBRIDE SKIN. MUSCLE FASCIA;  Surgeon: [name], DPM;  Location: [facility] ORS;  Service: Podiatry;  Laterality: Left;    Family History  Problem Relation Age of Onset  . Diabetes Mellitus II Mother   . CAD Mother     No Known Allergies     Latest Ref Rng & Units 09/04/2022   12:32 AM 09/02/2022    3:17 PM 08/30/2022    3:24 AM  CBC  WBC 4.0 - 10.5 K/uL 7.0  8.9  8.5   Hemoglobin 13.0 - 17.0 g/dL 9.9  16.1  09.6   Hematocrit 39.0 - 52.0 % 31.4  31.1  31.4   Platelets 150 - 400 K/uL 358  369  413       CMP     Component Value Date/Time   NA 135 09/05/2022 0415   K 3.9 09/05/2022 0415   CL 104 09/05/2022 0415   CO2 24 09/05/2022 0415   GLUCOSE 109 (H) 09/05/2022 0415   GLUCOSE 402 (H) 09/17/2012 1417   BUN 35 (H) 09/05/2022 0415   CREATININE 1.25 (H) 09/05/2022 0415   CALCIUM 8.9 09/05/2022 0415   PROT 7.0 09/04/2022 0032   ALBUMIN 2.6 (L) 09/04/2022 0032   AST 39 09/04/2022 0032   ALT 38 09/04/2022 0032   ALKPHOS 101 09/04/2022 0032   BILITOT 0.7 09/04/2022 0032   GFRNONAA >60 09/05/2022 0415   GFRAA 58 (L) 06/02/2015 0656     VAS [place] ABI WITH/WO TBI  Result Date: 09/04/2022  LOWER EXTREMITY DOPPLER STUDY Patient Name:  Andrew Harding  Date of Exam:   09/04/2022 Medical Rec #: [id number]    Accession #:    [id number] Date of Birth:  07-21-1950     Patient Gender: M Patient Age:   78 years Exam Location:  [facility] Procedure:      VAS [place] ABI WITH/WO TBI Referring Phys: Ovid Curd --------------------------------------------------------------------------------  Indications: Peripheral artery disease. Bilateral great toe amputations High Risk Factors: Hyperlipidemia, Diabetes.  Limitations: Today's exam was limited due to Right restricted arm. Comparison Study: No prior studies. Performing Technologist: Olen Cordial RVT  Examination  Guidelines: A complete evaluation includes at minimum, Doppler waveform signals and systolic blood pressure reading at the level of bilateral brachial, anterior tibial, and posterior tibial arteries, when vessel segments are accessible. Bilateral testing is considered an integral part of a complete examination. Photoelectric Plethysmograph (PPG) waveforms and toe systolic pressure readings are included as required and additional duplex testing as needed. Limited examinations for reoccurring indications may be performed as noted.  ABI Findings: +-----+------------------+-----+-----------+--------+ RightRt Pressure (mmHg)IndexWaveform   Comment  +-----+------------------+-----+-----------+--------+ PTA  85                0.69 monophasic          +-----+------------------+-----+-----------+--------+ DP   254               2.05 multiphasic         +-----+------------------+-----+-----------+--------+ +--------+------------------+-----+----------+-------+ Left    Lt Pressure (mmHg)IndexWaveform  Comment +--------+------------------+-----+----------+-------+ [id number]                    triphasic         +--------+------------------+-----+----------+-------+ PTA     66                0.53 monophasic        +--------+------------------+-----+----------+-------+ DP      254               2.05 monophasic        +--------+------------------+-----+----------+-------+  +-------+-----------+-----------+------------+------------+ ABI/TBIToday's ABIToday's TBIPrevious ABIPrevious TBI +-------+-----------+-----------+------------+------------+ Right  [place]                                             +-------+-----------+-----------+------------+------------+ Left   [place]                                             +-------+-----------+-----------+------------+------------+  Summary: Right: Resting right ankle-brachial index indicates noncompressible right lower extremity arteries. Unable to obtain TBI due to great toe amputation. Left: Resting left ankle-brachial index indicates noncompressible left lower extremity arteries. Unable to obtain TBI due to great toe amputation. *See table(s) above for measurements and observations.  Electronically signed by Gerarda Fraction on 09/04/2022 at 7:30:26 PM.    Final        Assessment & Plan:   1. Peripheral arterial disease with history of revascularization (HCC) Based on the noninvasive studies today the patient should have adequate perfusion for continuing to heal his TMA.  However given that he has had a long and difficult process with wound healing we will continue to follow very closely and have the patient return in 8 weeks with noninvasive studies.  2. Benign essential hypertension Continue antihypertensive medications as already ordered, these medications have been reviewed and there are no changes at this time.  3. Type 2 diabetes mellitus with stage 3b chronic kidney disease, with long-term current use of insulin (HCC) Continue hypoglycemic medications as already ordered, these medications have been reviewed and there are no changes at this time.  Hgb A1C to be monitored as already arranged by primary service   Current Outpatient Medications on File Prior to Visit  Medication Sig Dispense Refill  . acetaminophen (TYLENOL) 325 MG tablet Take 1-2 tablets (325-650 mg total) by mouth every 4 (four) hours as  needed for mild  pain.    . apixaban (ELIQUIS) 5 MG TABS tablet Take 1 tablet (5 mg total) by mouth 2 (two) times daily. 60 tablet 0  . ascorbic acid (VITAMIN C) 500 MG tablet Take 1 tablet (500 mg total) by mouth 2 (two) times daily. 100 tablet 0  . ciprofloxacin (CIPRO) 500 MG tablet Take 1 tablet (500 mg total) by mouth 2 (two) times daily. 28 tablet 0  . clopidogrel (PLAVIX) 75 MG tablet Take 1 tablet (75 mg total) by mouth daily with breakfast. 30 tablet 0  . Continuous Glucose Sensor (FREESTYLE LIBRE 2 SENSOR) MISC     . docusate sodium (COLACE) 100 MG capsule Take 1 capsule (100 mg total) by mouth 2 (two) times daily. 100 capsule 0  . gabapentin (NEURONTIN) 300 MG capsule Take 1 capsule (300 mg total) by mouth 3 (three) times daily. 90 capsule 0  . glucose blood (ONETOUCH ULTRA) test strip Use 3 (three) times daily    . insulin glargine (LANTUS) 100 UNIT/ML Solostar Pen Inject 36 Units into the skin at bedtime. (Patient taking differently: Inject 26 Units into the skin at bedtime.) 15 mL 11  . Insulin Pen Needle (FIFTY50 PEN NEEDLES) 32G X 4 MM MISC USE 4 TIMES DAILY    . metoprolol tartrate (LOPRESSOR) 25 MG tablet Take 1 tablet (25 mg total) by mouth 2 (two) times daily. 60 tablet 0  . nystatin (MYCOSTATIN) 100000 UNIT/ML suspension Take 5 mLs (500,000 Units total) by mouth 4 (four) times daily. 60 mL 0  . OZEMPIC, 0.25 OR 0.5 MG/DOSE, 2 MG/3ML SOPN Inject 0.75 mLs into the skin once a week.    . pravastatin (PRAVACHOL) 80 MG tablet TAKE ONE TABLET BY MOUTH AT NIGHT    . silver sulfADIAZINE (SILVADENE) 1 % cream Apply topically.    . traZODone (DESYREL) 50 MG tablet TAKE ONE TABLET AT BEDTIME AS NEEDED    . Zinc Sulfate 220 (50 Zn) MG TABS Take 1 tablet (220 mg total) by mouth daily with supper. 100 tablet 0  . amoxicillin-clavulanate (AUGMENTIN) 875-125 MG tablet Take 1 tablet by mouth 2 (two) times daily. (Patient not taking: Reported on 09/28/2022) 28 tablet 0  . cholecalciferol (VITAMIN  D) 1000 units tablet Take 2,000 Units by mouth daily. (Patient not taking: Reported on 09/28/2022)    . NOVOLOG FLEXPEN 100 UNIT/ML FlexPen Use the sliding scale insulin (Patient not taking: Reported on 09/14/2022) 15 mL 11  . nutrition supplement, JUVEN, (JUVEN) PACK Take 1 packet by mouth 2 (two) times daily between meals. Purchase over the counter or on [employer] (Patient not taking: Reported on 09/28/2022)  0  . TRULICITY 4.5 MG/0.5ML SOPN Inject into the skin. (Patient not taking: Reported on 09/28/2022)     No current facility-administered medications on file prior to visit.    There are no Patient Instructions on file for this visit. No follow-ups on file.   Georgiana Spinner, NP

## 2022-09-28 NOTE — Progress Notes (Signed)
Subjective:    Patient ID: Andrew Harding, male    DOB: June 01, 1950, 72 y.o.   MRN: [id number] Chief Complaint  Patient presents with   Follow-up    Ultrasound follow up    The patient returns to the office for followup and review status post angiogram with intervention on 08/22/2022.   Procedure: Procedure(s) Performed:             1.  Introduction catheter into left lower extremity 3rd order catheter placement               2.  Contrast injection left lower extremity for distal runoff with additional 3rd order             3.  Percutaneous transluminal angioplasty left anterior tibial to 3 mm                          4.  Selective contrast injection left posterior tibial             5.  Star close closure right common femoral arteriotomy   The patient subsequently underwent a left TMA and also needed subsequent debridement.  Following this the patient's wound has been healing fairly well based on podiatry's notes.  He is not walking due to the wound but denies any rest pains.  There have been no significant changes to the patient's overall health care.  No documented history of amaurosis fugax or recent TIA symptoms. There are no recent neurological changes noted. No documented history of DVT, PE or superficial thrombophlebitis. The patient denies recent episodes of angina or shortness of breath.   The patient has noncompressible ABIs bilaterally.  The right lower extremity has monophasic/biphasic waveforms with some dampened toe waveforms.  The left lower extremity has primarily triphasic waveforms throughout the lower extremity with biphasic flow in the anterior tibial and monophasic in the posterior tibial artery.  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    Review of Systems  Skin:  Positive for wound.  Neurological:  Positive for weakness.  All other systems reviewed and are negative.      Objective:   Physical Exam Vitals reviewed.   HENT:     Head: Normocephalic.  Cardiovascular:     Rate and Rhythm: Normal rate.     Pulses:          Dorsalis pedis pulses are detected w/ Doppler on the right side and detected w/ Doppler on the left side.       Posterior tibial pulses are detected w/ Doppler on the right side and detected w/ Doppler on the left side.  Pulmonary:     Effort: Pulmonary effort is normal.  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  Skin:    General: Skin is warm and dry.  Neurological:     Mental Status: He is alert and oriented to person, place, and time.  Psychiatric:        Mood and Affect: Mood normal.        Behavior: Behavior normal.        Thought Content: Thought content normal.        Judgment: Judgment normal.     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   Tubular adenoma    Vitamin D deficiency     Social History   Socioeconomic History   Marital status: Married    Spouse name: Ashley,Angela C   Number of children: Not on file   Years of education: Not on file   Highest education level: Not on file  Occupational History   Not on file  Tobacco Use   Smoking status: Former    Types: Cigars   Smokeless tobacco: Never  Vaping Use   Vaping Use: Never used  Substance and Sexual Activity   Alcohol use: No   Drug use: No   Sexual activity: Never  Other Topics Concern   Not on file  Social History Narrative   Patient is legally separated and lives at home by himself. His ex -wife is his HCPOA. Neighbors  and friends are his support system. He used to work for [employer]    Social Determinants of Health   Financial Resource Strain: Not on file  Food Insecurity: No Food Insecurity (08/19/2022)   Hunger Vital Sign    Worried About Running Out of Food in the Last Year: Never true    Ran Out of Food in the Last Year: Never true  Transportation Needs: No Transportation Needs (08/19/2022)   PRAPARE - Administrator, Civil Service (Medical): No    Lack of Transportation (Non-Medical): No  Physical Activity: Not on file  Stress: Not on file  Social Connections: Not on file  Intimate Partner Violence: Not At Risk (08/19/2022)   Humiliation, Afraid, Rape, and Kick questionnaire    Fear of Current or Ex-Partner: No    Emotionally Abused: No    Physically Abused: No    Sexually Abused: No    Past Surgical History:  Procedure Laterality Date   AMPUTATION Left 08/19/2022   Procedure: TRANSMETATARSAL AMPUTATION LEFT FOOT WITH IRRIGATION AND DEBRIDEMENT;  Surgeon: Rosetta Posner, DPM;  Location: [facility];  Service: Podiatry;  Laterality: Left;   AMPUTATION TOE Right 06/01/2015   Procedure: AMPUTATION TOE;  Surgeon: Recardo Evangelist, DPM;  Location: [facility];  Service: Podiatry;  Laterality: Right;   AMPUTATION TOE Left 08/11/2022   Procedure: AMPUTATION TOE 2, 3, 4;  Surgeon: [name], DPM;  Location: [facility];  Service: Podiatry;  Laterality: Left;   CATARACT EXTRACTION, BILATERAL     CIRCUMCISION N/A 06/12/2022   Procedure: CIRCUMCISION ADULT;  Surgeon: Vanna Scotland, MD;  Location: [facility];  Service: Urology;  Laterality: N/A;   COLONOSCOPY WITH PROPOFOL N/A 02/14/2016   Procedure: COLONOSCOPY WITH PROPOFOL;  Surgeon: Christena Deem, MD;  Location: [facility]COPY;  Service: Endoscopy;  Laterality: N/A;   COLONOSCOPY WITH PROPOFOL N/A 01/07/2019   Procedure: COLONOSCOPY WITH PROPOFOL;  Surgeon: Christena Deem, MD;  Location: [facility] ENDOSCOPY;  Service:  Endoscopy;  Laterality: N/A;   CORONARY ARTERY BYPASS GRAFT N/A 08/2012   EXCISION PARTIAL PHALANX Right 06/01/2015   Procedure: EXCISION PARTIAL PHALANX /  BONE;  Surgeon: Recardo Evangelist, DPM;  Location: [facility];  Service: Podiatry;  Laterality: Right;   FLEXIBLE SIGMOIDOSCOPY N/A 05/29/2016   Procedure: FLEXIBLE SIGMOIDOSCOPY;  Surgeon: Christena Deem, MD;  Location: [facility] ENDOSCOPY;  Service: Endoscopy;  Laterality: N/A;   INCISION AND DRAINAGE Left 08/23/2022   Procedure: INCISION AND DRAINAGE;  Surgeon: [name], DPM;  Location: [facility];  Service: Podiatry;  Laterality: Left;   INCISION AND DRAINAGE OF WOUND Left 09/15/2022   Procedure: 11044 - DEBRIDE BONE and EXCISION IF 1ST METATARSAL BONE WITH  DELAY PRIMARY CLOSURE;  Surgeon: [name], DPM;  Location: [facility];  Service: Podiatry;  Laterality: Left;   KNEE ARTHROSCOPY Left    LOWER EXTREMITY ANGIOGRAPHY Left 08/22/2022   Procedure: Lower Extremity Angiography;  Surgeon: Renford Dills, MD;  Location: [facility];  Service: Cardiovascular;  Laterality: Left;   WOUND DEBRIDEMENT Left 09/15/2022   Procedure: 11043 - DEBRIDE SKIN. MUSCLE FASCIA;  Surgeon: [name], DPM;  Location: [facility];  Service: Podiatry;  Laterality: Left;    Family History  Problem Relation Age of Onset   Diabetes Mellitus II Mother    CAD Mother     No Known Allergies     Latest Ref Rng & Units 09/04/2022   12:32 AM 09/02/2022    3:17 PM 08/30/2022    3:24 AM  CBC  WBC 4.0 - 10.5 K/uL 7.0  8.9  8.5   Hemoglobin 13.0 - 17.0 g/dL 9.9  16.1  09.6   Hematocrit 39.0 - 52.0 % 31.4  31.1  31.4   Platelets 150 - 400 K/uL 358  369  413       CMP     Component Value Date/Time   NA 135 09/05/2022 0415   K 3.9 09/05/2022 0415   CL 104 09/05/2022 0415   CO2 24 09/05/2022 0415   GLUCOSE 109 (H) 09/05/2022 0415   GLUCOSE 402 (H) 09/17/2012 1417   BUN 35 (H) 09/05/2022 0415   CREATININE 1.25 (H) 09/05/2022 0415   CALCIUM 8.9  09/05/2022 0415   PROT 7.0 09/04/2022 0032   ALBUMIN 2.6 (L) 09/04/2022 0032   AST 39 09/04/2022 0032   ALT 38 09/04/2022 0032   ALKPHOS 101 09/04/2022 0032   BILITOT 0.7 09/04/2022 0032   GFRNONAA >60 09/05/2022 0415   GFRAA 58 (L) 06/02/2015 0656     VAS [place] ABI WITH/WO TBI  Result Date: 09/04/2022  LOWER EXTREMITY DOPPLER STUDY Patient Name:  EMRY BLASE  Date of Exam:   09/04/2022 Medical Rec #: [id number]    Accession #:    [id number] Date of Birth: 1950/05/08     Patient Gender: M Patient Age:   62 years Exam Location:  [facility] Procedure:      VAS [place] ABI WITH/WO TBI Referring Phys: Ovid Curd --------------------------------------------------------------------------------  Indications: Peripheral artery disease. Bilateral great toe amputations High Risk Factors: Hyperlipidemia, Diabetes.  Limitations: Today's exam was limited due to Right restricted arm. Comparison Study: No prior studies. Performing Technologist: Olen Cordial RVT  Examination Guidelines: A complete evaluation includes at minimum, Doppler waveform signals and systolic blood pressure reading at the level of bilateral brachial, anterior tibial, and posterior tibial arteries, when vessel segments are accessible. Bilateral testing is considered an integral part of a complete examination. Photoelectric Plethysmograph (PPG) waveforms and toe systolic pressure readings are included as required and additional duplex testing as needed. Limited examinations for reoccurring indications may be performed as noted.  ABI Findings: +-----+------------------+-----+-----------+--------+ RightRt Pressure (mmHg)IndexWaveform   Comment  +-----+------------------+-----+-----------+--------+ PTA  85  0.69 monophasic          +-----+------------------+-----+-----------+--------+ DP   254               2.05 multiphasic         +-----+------------------+-----+-----------+--------+  +--------+------------------+-----+----------+-------+ Left    Lt Pressure (mmHg)IndexWaveform  Comment +--------+------------------+-----+----------+-------+ [id number]                    triphasic         +--------+------------------+-----+----------+-------+ PTA     66                0.53 monophasic        +--------+------------------+-----+----------+-------+ DP      254               2.05 monophasic        +--------+------------------+-----+----------+-------+ +-------+-----------+-----------+------------+------------+ ABI/TBIToday's ABIToday's TBIPrevious ABIPrevious TBI +-------+-----------+-----------+------------+------------+ Right  [place]                                             +-------+-----------+-----------+------------+------------+ Left   [place]                                             +-------+-----------+-----------+------------+------------+  Summary: Right: Resting right ankle-brachial index indicates noncompressible right lower extremity arteries. Unable to obtain TBI due to great toe amputation. Left: Resting left ankle-brachial index indicates noncompressible left lower extremity arteries. Unable to obtain TBI due to great toe amputation. *See table(s) above for measurements and observations.  Electronically signed by Gerarda Fraction on 09/04/2022 at 7:30:26 PM.    Final        Assessment & Plan:   1. Peripheral arterial disease with history of revascularization (HCC) Based on the noninvasive studies today the patient should have adequate perfusion for continuing to heal his TMA.  However given that he has had a long and difficult process with wound healing we will continue to follow very closely and have the patient return in 8 weeks with noninvasive studies.  2. Benign essential hypertension Continue antihypertensive medications as already ordered, these medications have been reviewed and there are no changes at this time.  3. Type 2 diabetes  mellitus with stage 3b chronic kidney disease, with long-term current use of insulin (HCC) Continue hypoglycemic medications as already ordered, these medications have been reviewed and there are no changes at this time.  Hgb A1C to be monitored as already arranged by primary service   Current Outpatient Medications on File Prior to Visit  Medication Sig Dispense Refill   acetaminophen (TYLENOL) 325 MG tablet Take 1-2 tablets (325-650 mg total) by mouth every 4 (four) hours as needed for mild pain.     apixaban (ELIQUIS) 5 MG TABS tablet Take 1 tablet (5 mg total) by mouth 2 (two) times daily. 60 tablet 0   ascorbic acid (VITAMIN C) 500 MG tablet Take 1 tablet (500 mg total) by mouth 2 (two) times daily. 100 tablet 0   ciprofloxacin (CIPRO) 500 MG tablet Take 1 tablet (500 mg total) by mouth 2 (two) times daily. 28 tablet 0   clopidogrel (PLAVIX) 75 MG tablet Take 1 tablet (75 mg total) by mouth daily with breakfast. 30 tablet 0   Continuous Glucose Sensor (FREESTYLE LIBRE 2 SENSOR)  MISC      docusate sodium (COLACE) 100 MG capsule Take 1 capsule (100 mg total) by mouth 2 (two) times daily. 100 capsule 0   gabapentin (NEURONTIN) 300 MG capsule Take 1 capsule (300 mg total) by mouth 3 (three) times daily. 90 capsule 0   glucose blood (ONETOUCH ULTRA) test strip Use 3 (three) times daily     insulin glargine (LANTUS) 100 UNIT/ML Solostar Pen Inject 36 Units into the skin at bedtime. (Patient taking differently: Inject 26 Units into the skin at bedtime.) 15 mL 11   Insulin Pen Needle (FIFTY50 PEN NEEDLES) 32G X 4 MM MISC USE 4 TIMES DAILY     metoprolol tartrate (LOPRESSOR) 25 MG tablet Take 1 tablet (25 mg total) by mouth 2 (two) times daily. 60 tablet 0   nystatin (MYCOSTATIN) 100000 UNIT/ML suspension Take 5 mLs (500,000 Units total) by mouth 4 (four) times daily. 60 mL 0   OZEMPIC, 0.25 OR 0.5 MG/DOSE, 2 MG/3ML SOPN Inject 0.75 mLs into the skin once a week.     pravastatin (PRAVACHOL) 80 MG  tablet TAKE ONE TABLET BY MOUTH AT NIGHT     silver sulfADIAZINE (SILVADENE) 1 % cream Apply topically.     traZODone (DESYREL) 50 MG tablet TAKE ONE TABLET AT BEDTIME AS NEEDED     Zinc Sulfate 220 (50 Zn) MG TABS Take 1 tablet (220 mg total) by mouth daily with supper. 100 tablet 0   amoxicillin-clavulanate (AUGMENTIN) 875-125 MG tablet Take 1 tablet by mouth 2 (two) times daily. (Patient not taking: Reported on 09/28/2022) 28 tablet 0   cholecalciferol (VITAMIN D) 1000 units tablet Take 2,000 Units by mouth daily. (Patient not taking: Reported on 09/28/2022)     NOVOLOG FLEXPEN 100 UNIT/ML FlexPen Use the sliding scale insulin (Patient not taking: Reported on 09/14/2022) 15 mL 11   nutrition supplement, JUVEN, (JUVEN) PACK Take 1 packet by mouth 2 (two) times daily between meals. Purchase over the counter or on [employer] (Patient not taking: Reported on 09/28/2022)  0   TRULICITY 4.5 MG/0.5ML SOPN Inject into the skin. (Patient not taking: Reported on 09/28/2022)     No current facility-administered medications on file prior to visit.    There are no Patient Instructions on file for this visit. No follow-ups on file.   Georgiana Spinner, NP

## 2022-09-29 LAB — VAS US ABI WITH/WO TBI
Left ABI: >1
Right ABI: >1

## 2022-10-11 ENCOUNTER — Other Ambulatory Visit (INDEPENDENT_AMBULATORY_CARE_PROVIDER_SITE_OTHER): Payer: Self-pay

## 2022-10-11 ENCOUNTER — Telehealth (INDEPENDENT_AMBULATORY_CARE_PROVIDER_SITE_OTHER): Payer: Self-pay | Admitting: Nurse Practitioner

## 2022-10-11 MED ORDER — APIXABAN 5 MG PO TABS: 5.0000 mg | ORAL_TABLET | Freq: Two times a day (BID) | ORAL | 5 refills | Status: DC

## 2022-10-11 MED ORDER — CLOPIDOGREL BISULFATE 75 MG PO TABS: 75.0000 mg | ORAL_TABLET | Freq: Every day | ORAL | 5 refills | Status: DC

## 2022-10-11 NOTE — Telephone Encounter (Signed)
Patient called in stating that he needed refills on medication sent to pharmacy   apixaban (ELIQUIS) 5 MG TABS tablet     clopidogrel (PLAVIX) 75 MG tablet     [facility] - [place], [place] - [address]     Please call and advise  Patient is also requesting a call when sent to pharmacy as well

## 2022-10-11 NOTE — Telephone Encounter (Signed)
Rx refill sent and patient was notified

## 2022-10-26 ENCOUNTER — Encounter: Payer: Self-pay | Admitting: Infectious Diseases

## 2022-10-26 ENCOUNTER — Ambulatory Visit: Payer: Managed Care, Other (non HMO) | Attending: Infectious Diseases | Admitting: Infectious Diseases

## 2022-10-26 VITALS — BP 136/74 | HR 74 | Temp 97.3°F

## 2022-10-26 DIAGNOSIS — R7401 Elevation of levels of liver transaminase levels: Secondary | ICD-10-CM | POA: Diagnosis not present

## 2022-10-26 DIAGNOSIS — T8743 Infection of amputation stump, right lower extremity: Secondary | ICD-10-CM | POA: Diagnosis not present

## 2022-10-26 DIAGNOSIS — E1140 Type 2 diabetes mellitus with diabetic neuropathy, unspecified: Secondary | ICD-10-CM | POA: Diagnosis not present

## 2022-10-26 DIAGNOSIS — I739 Peripheral vascular disease, unspecified: Secondary | ICD-10-CM | POA: Diagnosis not present

## 2022-10-26 DIAGNOSIS — I2510 Atherosclerotic heart disease of native coronary artery without angina pectoris: Secondary | ICD-10-CM | POA: Insufficient documentation

## 2022-10-26 DIAGNOSIS — Z87891 Personal history of nicotine dependence: Secondary | ICD-10-CM

## 2022-10-26 DIAGNOSIS — E1151 Type 2 diabetes mellitus with diabetic peripheral angiopathy without gangrene: Secondary | ICD-10-CM

## 2022-10-26 DIAGNOSIS — E1122 Type 2 diabetes mellitus with diabetic chronic kidney disease: Secondary | ICD-10-CM | POA: Diagnosis not present

## 2022-10-26 DIAGNOSIS — Z951 Presence of aortocoronary bypass graft: Secondary | ICD-10-CM | POA: Diagnosis not present

## 2022-10-26 DIAGNOSIS — B957 Other staphylococcus as the cause of diseases classified elsewhere: Secondary | ICD-10-CM

## 2022-10-26 DIAGNOSIS — L089 Local infection of the skin and subcutaneous tissue, unspecified: Secondary | ICD-10-CM | POA: Insufficient documentation

## 2022-10-26 DIAGNOSIS — Z794 Long term (current) use of insulin: Secondary | ICD-10-CM

## 2022-10-26 DIAGNOSIS — D631 Anemia in chronic kidney disease: Secondary | ICD-10-CM | POA: Diagnosis not present

## 2022-10-26 DIAGNOSIS — E1142 Type 2 diabetes mellitus with diabetic polyneuropathy: Secondary | ICD-10-CM

## 2022-10-26 DIAGNOSIS — Z89411 Acquired absence of right great toe: Secondary | ICD-10-CM

## 2022-10-26 DIAGNOSIS — Z7985 Long-term (current) use of injectable non-insulin antidiabetic drugs: Secondary | ICD-10-CM

## 2022-10-26 NOTE — Patient Instructions (Signed)
You are here forf ollow up of the left foot infection- the foot is doing better- still have the surgical incision on the sole not healed completely- it would be better if you use knee scooter or crutches and not bear weight on the foot for healing
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
## 2022-11-23 ENCOUNTER — Ambulatory Visit (INDEPENDENT_AMBULATORY_CARE_PROVIDER_SITE_OTHER): Payer: Managed Care, Other (non HMO) | Admitting: Vascular Surgery

## 2022-11-23 ENCOUNTER — Encounter (INDEPENDENT_AMBULATORY_CARE_PROVIDER_SITE_OTHER): Payer: Medicare Other

## 2023-02-07 ENCOUNTER — Emergency Department
Admission: EM | Admit: 2023-02-07 | Discharge: 2023-02-07 | Disposition: A | Discharge status: Home / Self Care | Payer: Managed Care, Other (non HMO) | Attending: Emergency Medicine | Admitting: Emergency Medicine

## 2023-02-07 ENCOUNTER — Other Ambulatory Visit: Payer: Self-pay

## 2023-02-07 ENCOUNTER — Encounter: Payer: Self-pay | Admitting: Emergency Medicine

## 2023-02-07 DIAGNOSIS — Y92481 Parking lot as the place of occurrence of the external cause: Secondary | ICD-10-CM | POA: Insufficient documentation

## 2023-02-07 DIAGNOSIS — Z0283 Encounter for blood-alcohol and blood-drug test: Secondary | ICD-10-CM | POA: Diagnosis present
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
## 2023-02-07 NOTE — ED Notes (Signed)
Pt verbalizes understanding of discharge instructions. Opportunity for questioning and answers were provided. Pt discharged from ED to home.   ? ?

## 2023-02-26 ENCOUNTER — Inpatient Hospital Stay
Admission: EM | Admit: 2023-02-26 | Discharge: 2023-03-05 | DRG: 503 | Disposition: A | Discharge status: Home Health | Payer: Managed Care, Other (non HMO) | Attending: Internal Medicine | Admitting: Internal Medicine

## 2023-02-26 ENCOUNTER — Other Ambulatory Visit: Payer: Self-pay

## 2023-02-26 ENCOUNTER — Emergency Department: Payer: Managed Care, Other (non HMO)

## 2023-02-26 DIAGNOSIS — Z7901 Long term (current) use of anticoagulants: Secondary | ICD-10-CM | POA: Diagnosis not present

## 2023-02-26 DIAGNOSIS — M86272 Subacute osteomyelitis, left ankle and foot: Secondary | ICD-10-CM | POA: Diagnosis not present

## 2023-02-26 DIAGNOSIS — E1169 Type 2 diabetes mellitus with other specified complication: Secondary | ICD-10-CM | POA: Diagnosis present

## 2023-02-26 DIAGNOSIS — B9561 Methicillin susceptible Staphylococcus aureus infection as the cause of diseases classified elsewhere: Secondary | ICD-10-CM

## 2023-02-26 DIAGNOSIS — L03116 Cellulitis of left lower limb: Secondary | ICD-10-CM | POA: Diagnosis present

## 2023-02-26 DIAGNOSIS — I4819 Other persistent atrial fibrillation: Secondary | ICD-10-CM | POA: Diagnosis present

## 2023-02-26 DIAGNOSIS — R55 Syncope and collapse: Secondary | ICD-10-CM | POA: Diagnosis present

## 2023-02-26 DIAGNOSIS — Z7984 Long term (current) use of oral hypoglycemic drugs: Secondary | ICD-10-CM | POA: Diagnosis not present

## 2023-02-26 DIAGNOSIS — N179 Acute kidney failure, unspecified: Secondary | ICD-10-CM | POA: Diagnosis present

## 2023-02-26 DIAGNOSIS — Z89432 Acquired absence of left foot: Secondary | ICD-10-CM | POA: Diagnosis not present

## 2023-02-26 DIAGNOSIS — Z951 Presence of aortocoronary bypass graft: Secondary | ICD-10-CM

## 2023-02-26 DIAGNOSIS — I4891 Unspecified atrial fibrillation: Principal | ICD-10-CM | POA: Diagnosis present

## 2023-02-26 DIAGNOSIS — E11621 Type 2 diabetes mellitus with foot ulcer: Secondary | ICD-10-CM | POA: Diagnosis present

## 2023-02-26 DIAGNOSIS — E1140 Type 2 diabetes mellitus with diabetic neuropathy, unspecified: Secondary | ICD-10-CM | POA: Insufficient documentation

## 2023-02-26 DIAGNOSIS — E1142 Type 2 diabetes mellitus with diabetic polyneuropathy: Secondary | ICD-10-CM

## 2023-02-26 DIAGNOSIS — Z794 Long term (current) use of insulin: Secondary | ICD-10-CM | POA: Diagnosis not present

## 2023-02-26 DIAGNOSIS — R42 Dizziness and giddiness: Secondary | ICD-10-CM

## 2023-02-26 DIAGNOSIS — E1151 Type 2 diabetes mellitus with diabetic peripheral angiopathy without gangrene: Secondary | ICD-10-CM | POA: Diagnosis present

## 2023-02-26 DIAGNOSIS — I2510 Atherosclerotic heart disease of native coronary artery without angina pectoris: Secondary | ICD-10-CM | POA: Diagnosis present

## 2023-02-26 DIAGNOSIS — E1122 Type 2 diabetes mellitus with diabetic chronic kidney disease: Secondary | ICD-10-CM | POA: Diagnosis present

## 2023-02-26 DIAGNOSIS — T8744 Infection of amputation stump, left lower extremity: Principal | ICD-10-CM | POA: Diagnosis present

## 2023-02-26 DIAGNOSIS — A419 Sepsis, unspecified organism: Secondary | ICD-10-CM | POA: Diagnosis present

## 2023-02-26 DIAGNOSIS — R6520 Severe sepsis without septic shock: Secondary | ICD-10-CM | POA: Diagnosis present

## 2023-02-26 DIAGNOSIS — Z635 Disruption of family by separation and divorce: Secondary | ICD-10-CM

## 2023-02-26 DIAGNOSIS — Z7902 Long term (current) use of antithrombotics/antiplatelets: Secondary | ICD-10-CM

## 2023-02-26 DIAGNOSIS — Z7985 Long-term (current) use of injectable non-insulin antidiabetic drugs: Secondary | ICD-10-CM

## 2023-02-26 DIAGNOSIS — E872 Acidosis, unspecified: Secondary | ICD-10-CM | POA: Diagnosis present

## 2023-02-26 DIAGNOSIS — E7800 Pure hypercholesterolemia, unspecified: Secondary | ICD-10-CM | POA: Diagnosis present

## 2023-02-26 DIAGNOSIS — Z8619 Personal history of other infectious and parasitic diseases: Secondary | ICD-10-CM

## 2023-02-26 DIAGNOSIS — L97529 Non-pressure chronic ulcer of other part of left foot with unspecified severity: Secondary | ICD-10-CM | POA: Diagnosis present

## 2023-02-26 DIAGNOSIS — A4101 Sepsis due to Methicillin susceptible Staphylococcus aureus: Secondary | ICD-10-CM | POA: Diagnosis present

## 2023-02-26 DIAGNOSIS — Z833 Family history of diabetes mellitus: Secondary | ICD-10-CM

## 2023-02-26 DIAGNOSIS — E11628 Type 2 diabetes mellitus with other skin complications: Secondary | ICD-10-CM | POA: Diagnosis present

## 2023-02-26 DIAGNOSIS — L03119 Cellulitis of unspecified part of limb: Secondary | ICD-10-CM

## 2023-02-26 DIAGNOSIS — M86172 Other acute osteomyelitis, left ankle and foot: Secondary | ICD-10-CM | POA: Diagnosis present

## 2023-02-26 DIAGNOSIS — I129 Hypertensive chronic kidney disease with stage 1 through stage 4 chronic kidney disease, or unspecified chronic kidney disease: Secondary | ICD-10-CM | POA: Diagnosis present

## 2023-02-26 DIAGNOSIS — Z8249 Family history of ischemic heart disease and other diseases of the circulatory system: Secondary | ICD-10-CM

## 2023-02-26 DIAGNOSIS — N1832 Chronic kidney disease, stage 3b: Secondary | ICD-10-CM | POA: Diagnosis present

## 2023-02-26 DIAGNOSIS — Z79899 Other long term (current) drug therapy: Secondary | ICD-10-CM

## 2023-02-26 DIAGNOSIS — Z87891 Personal history of nicotine dependence: Secondary | ICD-10-CM

## 2023-02-26 DIAGNOSIS — Z89421 Acquired absence of other right toe(s): Secondary | ICD-10-CM

## 2023-02-26 DIAGNOSIS — M869 Osteomyelitis, unspecified: Secondary | ICD-10-CM | POA: Diagnosis present

## 2023-02-26 DIAGNOSIS — R7881 Bacteremia: Secondary | ICD-10-CM

## 2023-02-26 HISTORY — DX: Syncope and collapse: R42

## 2023-02-26 LAB — COMPREHENSIVE METABOLIC PANEL
ALT: 28 U/L (ref 0–44)
AST: 27 U/L (ref 15–41)
Albumin: 3.7 g/dL (ref 3.5–5.0)
Alkaline Phosphatase: 64 U/L (ref 38–126)
Anion gap: 12 (ref 5–15)
BUN: 23 mg/dL (ref 8–23)
CO2: 23 mmol/L (ref 22–32)
Calcium: 8.9 mg/dL (ref 8.9–10.3)
Chloride: 97 mmol/L — ABNORMAL LOW (ref 98–111)
Creatinine, Ser: 1.76 mg/dL — ABNORMAL HIGH (ref 0.61–1.24)
GFR, Estimated: 40 mL/min — ABNORMAL LOW (ref >60)
Glucose, Bld: 255 mg/dL — ABNORMAL HIGH (ref 70–99)
Potassium: 4.3 mmol/L (ref 3.5–5.1)
Sodium: 132 mmol/L — ABNORMAL LOW (ref 135–145)
Total Bilirubin: 0.8 mg/dL (ref 0.3–1.2)
Total Protein: 8.4 g/dL — ABNORMAL HIGH (ref 6.5–8.1)

## 2023-02-26 LAB — CBC WITH DIFFERENTIAL/PLATELET
Abs Immature Granulocytes: 0.07 10*3/uL (ref 0.00–0.07)
Basophils Absolute: 0 10*3/uL (ref 0.0–0.1)
Basophils Relative: 0 %
Eosinophils Absolute: 0.1 10*3/uL (ref 0.0–0.5)
Eosinophils Relative: 1 %
HCT: 35.1 % — ABNORMAL LOW (ref 39.0–52.0)
Hemoglobin: 11.4 g/dL — ABNORMAL LOW (ref 13.0–17.0)
Immature Granulocytes: 1 %
Lymphocytes Relative: 4 %
Lymphs Abs: 0.5 10*3/uL — ABNORMAL LOW (ref 0.7–4.0)
MCH: 29.2 pg (ref 26.0–34.0)
MCHC: 32.5 g/dL (ref 30.0–36.0)
MCV: 89.8 fL (ref 80.0–100.0)
Monocytes Absolute: 0.4 10*3/uL (ref 0.1–1.0)
Monocytes Relative: 3 %
Neutro Abs: 10.1 10*3/uL — ABNORMAL HIGH (ref 1.7–7.7)
Neutrophils Relative %: 91 %
Platelets: 357 10*3/uL (ref 150–400)
RBC: 3.91 MIL/uL — ABNORMAL LOW (ref 4.22–5.81)
RDW: 12.7 % (ref 11.5–15.5)
WBC: 11.1 10*3/uL — ABNORMAL HIGH (ref 4.0–10.5)
nRBC: 0 % (ref 0.0–0.2)

## 2023-02-26 LAB — LACTIC ACID, PLASMA
Lactic Acid, Venous: 3.2 mmol/L (ref 0.5–1.9)
Lactic Acid, Venous: 1.7 mmol/L (ref 0.5–1.9)

## 2023-02-26 LAB — PROTIME-INR
INR: 1.6 — ABNORMAL HIGH (ref 0.8–1.2)
Prothrombin Time: 19.5 s — ABNORMAL HIGH (ref 11.4–15.2)

## 2023-02-26 MED ORDER — VANCOMYCIN HCL IN DEXTROSE 1-5 GM/200ML-% IV SOLN
1000.0000 mg | Freq: Once | INTRAVENOUS | Status: AC
  Administered 2023-02-26: 1000 mg via INTRAVENOUS
  Filled 2023-02-26: qty 200

## 2023-02-26 MED ORDER — VANCOMYCIN HCL IN DEXTROSE 1-5 GM/200ML-% IV SOLN: 1000.0000 mg | Freq: Once | INTRAVENOUS | Status: DC

## 2023-02-26 MED ORDER — METOPROLOL TARTRATE 25 MG PO TABS
25.0000 mg | ORAL_TABLET | Freq: Once | ORAL | Status: AC
  Administered 2023-02-26: 25 mg via ORAL
  Filled 2023-02-26: qty 1

## 2023-02-26 MED ORDER — METOPROLOL TARTRATE 5 MG/5ML IV SOLN
5.0000 mg | Freq: Once | INTRAVENOUS | Status: AC
  Administered 2023-02-26: 5 mg via INTRAVENOUS
  Filled 2023-02-26: qty 5

## 2023-02-26 MED ORDER — SODIUM CHLORIDE 0.9 % IV BOLUS
1000.0000 mL | Freq: Once | INTRAVENOUS | Status: AC
  Administered 2023-02-26: 1000 mL via INTRAVENOUS

## 2023-02-26 MED ORDER — SODIUM CHLORIDE 0.9 % IV SOLN
2.0000 g | Freq: Once | INTRAVENOUS | Status: AC
Start: 2023-02-26 — End: 2023-02-26
  Administered 2023-02-26: 2 g via INTRAVENOUS
  Filled 2023-02-26: qty 20
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
## 2023-02-26 NOTE — ED Notes (Signed)
Pt ambulated around the room while on the cardiac monitor.  His HR did jump up to 140's but then back to his baseline around 90'S

## 2023-02-26 NOTE — H&P (Incomplete)
History and Physical    [name] AYT:[id number] DOB: 1950-06-23 DOA: 02/26/2023 DOS: the patient was seen and examined on 02/26/2023 PCP: Lynnea Ferrier, MD  Patient coming from: Home  Chief Complaint:  Chief Complaint  Patient presents with  . Shortness of Breath    HPI: ALTARIQ SALAZ is a 72 y.o. male with medical history significant for insulin-dependent DM 2, A-fib on Eliquis, HTN, CAD status post CABG, CKD 3B, PAD s/p transmetatarsal amputation left foot with subsequent debridement in May last seen by podiatry 2 weeks ago on 02/12/2023 who is being admitted with a foot wound infection with possible sepsis and A fib with RVR after presenting with a presyncopal episode.  Per Podiatry note from 10/14 , the wound at the bottom of the foot had reopened and was draining and bleeding.  He was prescribed Santyl dressings.  Since his visit however, he has noticed more redness to the area.  Additionally he has been feeling more fatigued and then today while at work, while going up some stairs he felt like he  became acutely short of breath and felt like he was going to pass out , thus prompting the visit to the ED. ED course and data review: On arrival tachycardic to up to 136 and tachypneic to 22 but afebrile and with SBP in the 130s to 150s. Labs: WBC 11,000 with lactic acid 3.2--1.7. Hemoglobin at baseline at 11.4 Creatinine 1.76 up from baseline of 1.13 about 6 months prior Blood glucose 255 EKG, personally viewed and interpreted showing A-fib at 124 with no ischemic changes. Foot x-ray showing possible osteomyelitis with recommendation for MRI as follows IMPRESSION: Transmetatarsal amputation of the left foot with soft tissue swelling and irregularity about the stump of the first metatarsal. Questionable loss of cortical distinctness about the stumps of the first and second metatarsals. Osteomyelitis is not excluded. Consider MRI further evaluation.  Chest x-ray  nonacute  Patient started on vancomycin and Rocephin  Hospitalist consulted for admission.   Review of Systems: {ROS_Text:26778}  Past Medical History:  Diagnosis Date  . Bladder neck obstruction   . Chronic kidney disease   . Coronary artery disease    a.) s/p 4v CABG in 2014  . Diabetes mellitus without complication (HCC)   . Diabetic neuropathy (HCC)   . Diabetic peripheral neuropathy (HCC)   . Diverticulosis   . Gout   . Heart murmur   . Hypercholesteremia   . Hyperlipidemia   . Hypertension   . Peripheral neuropathy   . 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 Tubular adenoma   . Vitamin D deficiency    Past Surgical History:  Procedure Laterality Date  . AMPUTATION Left 08/19/2022   Procedure: TRANSMETATARSAL AMPUTATION LEFT FOOT WITH IRRIGATION AND DEBRIDEMENT;  Surgeon: Rosetta Posner, DPM;  Location: [facility];  Service: Podiatry;  Laterality: Left;  . AMPUTATION TOE Right 06/01/2015   Procedure: AMPUTATION TOE;  Surgeon: Recardo Evangelist, DPM;  Location: [facility];  Service: Podiatry;  Laterality: Right;  . AMPUTATION TOE Left 08/11/2022   Procedure: AMPUTATION TOE 2, 3, 4;  Surgeon: [name], DPM;  Location: [facility];  Service: Podiatry;  Laterality: Left;  . CATARACT EXTRACTION, BILATERAL    . CIRCUMCISION N/A 06/12/2022   Procedure: CIRCUMCISION ADULT;  Surgeon: Vanna Scotland, MD;  Location: [facility];  Service: Urology;  Laterality: N/A;  . COLONOSCOPY WITH PROPOFOL N/A 02/14/2016   Procedure: COLONOSCOPY WITH PROPOFOL;  Surgeon: Christena Deem, MD;  Location: [facility];  Service: Endoscopy;  Laterality: N/A;  . COLONOSCOPY WITH PROPOFOL N/A 01/07/2019   Procedure: COLONOSCOPY WITH PROPOFOL;  Surgeon: Christena Deem, MD;  Location: [facility];  Service: Endoscopy;  Laterality: N/A;  . CORONARY ARTERY BYPASS GRAFT N/A 08/2012  . EXCISION PARTIAL PHALANX Right 06/01/2015   Procedure: EXCISION PARTIAL PHALANX /  BONE;  Surgeon: Recardo Evangelist, DPM;  Location:  [facility];  Service: Podiatry;  Laterality: Right;  . FLEXIBLE SIGMOIDOSCOPY N/A 05/29/2016   Procedure: FLEXIBLE SIGMOIDOSCOPY;  Surgeon: Christena Deem, MD;  Location: [facility] END[facility]COPY;  Service: Endoscopy;  Laterality: N/A;  . INCISION AND DRAINAGE Left 08/23/2022   Procedure: INCISION AND DRAINAGE;  Surgeon: [name], DPM;  Location: [facility];  Service: Podiatry;  Laterality: Left;  . INCISION AND DRAINAGE OF WOUND Left 09/15/2022   Procedure: 11044 - DEBRIDE BONE and EXCISION IF 1ST METATARSAL BONE WITH  DELAY PRIMARY CLOSURE;  Surgeon: [name], DPM;  Location: [facility];  Service: Podiatry;  Laterality: Left;  . KNEE ARTHROSCOPY Left   . LOWER EXTREMITY ANGIOGRAPHY Left 08/22/2022   Procedure: Lower Extremity Angiography;  Surgeon: Renford Dills, MD;  Location: [facility];  Service: Cardiovascular;  Laterality: Left;  . WOUND DEBRIDEMENT Left 09/15/2022   Procedure: 11043 - DEBRIDE SKIN. MUSCLE FASCIA;  Surgeon: [name], DPM;  Location: [facility];  Service: Podiatry;  Laterality: Left;   Social History:  reports that he has quit smoking. His smoking use included cigars. He has never used smokeless tobacco. He reports that he does not drink alcohol and does not use drugs.  No Known Allergies  Family History  Problem Relation Age of Onset  . Diabetes Mellitus II Mother   . CAD Mother     Prior to Admission medications   Medication Sig Start Date End Date Taking? Authorizing Provider  acetaminophen (TYLENOL) 325 MG tablet Take 1-2 tablets (325-650 mg total) by mouth every 4 (four) hours as needed for mild pain. 08/30/22   Love, Evlyn Kanner, PA-C  amoxicillin-clavulanate (AUGMENTIN) 875-125 MG tablet Take 1 tablet by mouth 2 (two) times daily. Patient not taking: Reported on 09/28/2022 09/19/22   Lynn Ito, MD  apixaban (ELIQUIS) 5 MG TABS tablet Take 1 tablet (5 mg total) by mouth 2 (two) times daily. 10/11/22   Georgiana Spinner, NP  ascorbic acid  (VITAMIN C) 500 MG tablet Take 1 tablet (500 mg total) by mouth 2 (two) times daily. 09/05/22   Love, Evlyn Kanner, PA-C  cholecalciferol (VITAMIN D) 1000 units tablet Take 2,000 Units by mouth daily. Patient not taking: Reported on 09/28/2022    [provider]  ciprofloxacin (CIPRO) 500 MG tablet Take 1 tablet (500 mg total) by mouth 2 (two) times daily. 09/19/22   Lynn Ito, MD  clopidogrel (PLAVIX) 75 MG tablet Take 1 tablet (75 mg total) by mouth daily with breakfast. 10/11/22   Georgiana Spinner, NP  Continuous Glucose Sensor (FREESTYLE LIBRE 2 SENSOR) MISC  09/05/22   [provider]  docusate sodium (COLACE) 100 MG capsule Take 1 capsule (100 mg total) by mouth 2 (two) times daily. 09/05/22   Love, Evlyn Kanner, PA-C  gabapentin (NEURONTIN) 300 MG capsule Take 1 capsule (300 mg total) by mouth 3 (three) times daily. 09/05/22   Love, Evlyn Kanner, PA-C  glucose blood (ONETOUCH ULTRA) test strip Use 3 (three) times daily 09/09/18   [provider]  insulin glargine (LANTUS) 100 UNIT/ML Solostar Pen Inject 36 Units  into the skin at bedtime. Patient taking differently: Inject 26 Units into the skin at bedtime. 09/05/22   Love, Evlyn Kanner, PA-C  Insulin Pen Needle (FIFTY50 PEN NEEDLES) 32G X 4 MM MISC USE 4 TIMES DAILY 07/29/18   [provider]  metoprolol tartrate (LOPRESSOR) 25 MG tablet Take 1 tablet (25 mg total) by mouth 2 (two) times daily. 09/05/22   Love, Evlyn Kanner, PA-C  NOVOLOG FLEXPEN 100 UNIT/ML FlexPen Use the sliding scale insulin Patient not taking: Reported on 09/14/2022 09/05/22   Love, Evlyn Kanner, PA-C  nutrition supplement, JUVEN, (JUVEN) PACK Take 1 packet by mouth 2 (two) times daily between meals. Purchase over the counter or on [employer] Patient not taking: Reported on 09/28/2022 09/05/22   Love, Evlyn Kanner, PA-C  nystatin (MYCOSTATIN) 100000 UNIT/ML suspension Take 5 mLs (500,000 Units total) by mouth 4 (four) times daily. 09/05/22   Love, Evlyn Kanner, PA-C  OZEMPIC, 0.25  OR 0.5 MG/DOSE, 2 MG/3ML SOPN Inject 0.75 mLs into the skin once a week.    [provider]  pravastatin (PRAVACHOL) 80 MG tablet TAKE ONE TABLET BY MOUTH AT NIGHT 07/08/18   [provider]  silver sulfADIAZINE (SILVADENE) 1 % cream Apply topically. 07/31/22   [provider]  traZODone (DESYREL) 50 MG tablet TAKE ONE TABLET AT BEDTIME AS NEEDED 10/21/18   [provider]  TRULICITY 4.5 MG/0.5ML SOPN Inject into the skin. Patient not taking: Reported on 09/28/2022 07/10/22   [provider]  Zinc Sulfate 220 (50 Zn) MG TABS Take 1 tablet (220 mg total) by mouth daily with supper. 09/05/22   Jacquelynn Cree, PA-C    Physical Exam: Vitals:   02/26/23 2100 02/26/23 2130 02/26/23 2200 02/26/23 2340  BP: (!) 108/48 (!) 113/56 (!) 132/57   Pulse: 92 91 (!) 101   Resp: (!) 21 (!) 23 17   Temp:    98.6 F (37 C)  TempSrc:    Oral  SpO2: 97% 98% 99%   Weight:      Height:       Physical Exam  Labs on Admission: I have personally reviewed following labs and imaging studies  CBC: Recent Labs  Lab 02/26/23 1630  WBC 11.1*  NEUTROABS 10.1*  HGB 11.4*  HCT 35.1*  MCV 89.8  PLT 357   Basic Metabolic Panel: Recent Labs  Lab 02/26/23 1630  NA 132*  K 4.3  CL 97*  CO2 23  GLUCOSE 255*  BUN 23  CREATININE 1.76*  CALCIUM 8.9   GFR: Estimated Creatinine Clearance: 44.7 mL/min (A) (by C-G formula based on SCr of 1.76 mg/dL (H)). Liver Function Tests: Recent Labs  Lab 02/26/23 1630  AST 27  ALT 28  ALKPHOS 64  BILITOT 0.8  PROT 8.4*  ALBUMIN 3.7   No results for input(s): "LIPASE", "AMYLASE" in the last 168 hours. No results for input(s): "AMMONIA" in the last 168 hours. Coagulation Profile: Recent Labs  Lab 02/26/23 1630  INR 1.6*   Cardiac Enzymes: No results for input(s): "CKTOTAL", "CKMB", "CKMBINDEX", "TROPONINI" in the last 168 hours. BNP (last 3 results) No results for input(s): "PROBNP" in the last 8760 hours. HbA1C: No  results for input(s): "HGBA1C" in the last 72 hours. CBG: No results for input(s): "GLUCAP" in the last 168 hours. Lipid Profile: No results for input(s): "CHOL", "HDL", "LDLCALC", "TRIG", "CHOLHDL", "LDLDIRECT" in the last 72 hours. Thyroid Function Tests: No results for input(s): "TSH", "T4TOTAL", "FREET4", "T3FREE", "THYROIDAB" in the last 72  hours. Anemia Panel: No results for input(s): "VITAMINB12", "FOLATE", "FERRITIN", "TIBC", "IRON", "RETICCTPCT" in the last 72 hours. Urine analysis:    Component Value Date/Time   COLORURINE YELLOW (A) 08/17/2022 2100   APPEARANCEUR HAZY (A) 08/17/2022 2100   APPEARANCEUR Hazy (A) 05/30/2022 1026   LABSPEC 1.041 (H) 08/17/2022 2100   PHURINE 5.0 08/17/2022 2100   GLUCOSEU 50 (A) 08/17/2022 2100   HGBUR SMALL (A) 08/17/2022 2100   BILIRUBINUR NEGATIVE 08/17/2022 2100   BILIRUBINUR Negative 05/30/2022 1026   KETONESUR NEGATIVE 08/17/2022 2100   PROTEINUR 30 (A) 08/17/2022 2100   UROBILINOGEN 0.2 04/05/2018 1042   NITRITE NEGATIVE 08/17/2022 2100   LEUKOCYTESUR NEGATIVE 08/17/2022 2100    Radiological Exams on Admission: DG Foot Complete Left  Result Date: 02/26/2023 CLINICAL DATA:  Left foot wounds, sores, redness, leaking EXAM: LEFT FOOT - COMPLETE 3+ VIEW COMPARISON:  09/02/2022 FINDINGS: Transmetatarsal amputation of the left foot. Swelling about the distal stump and soft tissue irregularity about the stump of the first metatarsal. Questionable loss of cortical distinctness about the stumps of the first and second metatarsals. Vascular calcifications. IMPRESSION: Transmetatarsal amputation of the left foot with soft tissue swelling and irregularity about the stump of the first metatarsal. Questionable loss of cortical distinctness about the stumps of the first and second metatarsals. Osteomyelitis is not excluded. Consider MRI further evaluation. Electronically Signed   By: Minerva Fester M.D.   On: 02/26/2023 20:39   DG Chest Portable 1  View  Result Date: 02/26/2023 CLINICAL DATA:  Shortness of breath and pain EXAM: PORTABLE CHEST 1 VIEW COMPARISON:  08/25/2022 FINDINGS: Sternotomy and CABG. Stable cardiomediastinal silhouette. No focal consolidation, pleural effusion, or pneumothorax. No displaced rib fractures. IMPRESSION: No active disease. Electronically Signed   By: Minerva Fester M.D.   On: 02/26/2023 20:36     Data Reviewed: Relevant notes from primary care and specialist visits, past discharge summaries as available in EHR, including Care Everywhere. Prior diagnostic testing as pertinent to current admission diagnoses Updated medications and problem lists for reconciliation ED course, including vitals, labs, imaging, treatment and response to treatment Triage notes, nursing and pharmacy notes and ED provider's notes Notable results as noted in HPI   Assessment and Plan: No notes have been filed under this hospital service. Service: Hospitalist       DVT prophylaxis: Lovenox***  Consults: none***  Advance Care Planning:   Code Status: Prior ***  Family Communication: none***  Disposition Plan: Back to previous home environment  Severity of Illness: {Observation/Inpatient:21159}  Author: Andris Baumann, MD 02/26/2023 11:43 PM  For on call review [email or web address].

## 2023-02-26 NOTE — Consult Note (Signed)
CODE SEPSIS - PHARMACY COMMUNICATION  **Broad Spectrum Antibiotics should be administered within 1 hour of Sepsis diagnosis**  Time Code Sepsis Called/Page Received: 1633  Antibiotics Ordered: ceftriaxone and vancomycin  Time of 1st antibiotic administration: 1651  Additional action taken by pharmacy: N/A  Barrie Folk ,PharmD Clinical Pharmacist  02/26/2023  4:39 PM

## 2023-02-26 NOTE — ED Provider Notes (Signed)
[facility] Provider Note    Event Date/Time   First MD Initiated Contact with Patient 02/26/23 1618     (approximate)   History   Shortness of Breath   HPI  Andrew Harding is a 72 y.o. male who comes in from [place] [facility] due to shortness of breath, chills and overall not feeling well.  He did have his toe amputated to the left foot 6 months ago he is a known diabetic.  Patient is on Eliquis and Plavix due to atrial fibrillation that he has a rate controlled and he has been in since April 2024.  I reviewed this hospital discharge from April 2024.  It was that patient was controlled with metoprolol 25 mg twice daily.  Patient reports over the past few weeks he just been feeling more tired and weak.  He reports for the past week and a half he has noticed some more redness on his left foot.  He is status post amputation of his toes there.  He states that today he was at work when he was trying to walk and walk up some stairs where he felt very weak and like he was going to pass out and short of breath.  He reports compliance with his Eliquis.  He denies any known fevers.  Denies any abdominal pain.  Denies any falls and hitting his head.    Physical Exam   Triage Vital Signs: ED Triage Vitals [02/26/23 1616]  Encounter Vitals Group     BP 135/84     Systolic BP Percentile      Diastolic BP Percentile      Pulse Rate (!) 136     Resp (!) 22     Temp 98.6 F (37 C)     Temp src      SpO2 94 %     Weight 213 lb 13.5 oz (97 kg)     Height 6\' 3"  (1.905 m)     Head Circumference      Peak Flow      Pain Score 0     Pain Loc      Pain Education      Exclude from Growth Chart     Most recent vital signs: Vitals:   02/26/23 1616  BP: 135/84  Pulse: (!) 136  Resp: (!) 22  Temp: 98.6 F (37 C)  SpO2: 94%     General: Awake, no distress.  CV:  Good peripheral perfusion.  Irregular, tachycardic Resp:  Normal effort.  Abd:  No distention.   Other:  No swelling in legs.  He is got amputated toes of his left foot with good distal pulse but redness and wounds noted to the area.   ED Results / Procedures / Treatments   Labs (all labs ordered are listed, but only abnormal results are displayed) Labs Reviewed  CULTURE, BLOOD (ROUTINE X 2)  CULTURE, BLOOD (ROUTINE X 2)  COMPREHENSIVE METABOLIC PANEL  LACTIC ACID, PLASMA  LACTIC ACID, PLASMA  CBC WITH DIFFERENTIAL/PLATELET  PROTIME-INR  URINALYSIS, W/ REFLEX TO CULTURE (INFECTION SUSPECTED)     EKG  My interpretation of EKG:  Atrial fibrillation with a rate of 124 without any ST elevation or T wave inversions except for the inferior leads, normal intervals  RADIOLOGY I have reviewed the xray personally and interpreted no signs of edema    PROCEDURES:  Critical Care performed: Yes, see critical care procedure note(s)  .1-3 Lead EKG Interpretation  Performed  by: Concha Se, MD Authorized by: Concha Se, MD     Interpretation: abnormal     ECG rate:  130   ECG rate assessment: tachycardic     Rhythm: atrial fibrillation     Ectopy: none     Conduction: normal   .Critical Care  Performed by: Concha Se, MD Authorized by: Concha Se, MD   Critical care provider statement:    Critical care time (minutes):  30   Critical care was necessary to treat or prevent imminent or life-threatening deterioration of the following conditions:  Cardiac failure   Critical care was time spent personally by me on the following activities:  Development of treatment plan with patient or surrogate, discussions with consultants, evaluation of patient's response to treatment, examination of patient, ordering and review of laboratory studies, ordering and review of radiographic studies, ordering and performing treatments and interventions, pulse oximetry, re-evaluation of patient's condition and review of old charts    MEDICATIONS ORDERED IN ED: Medications  cefTRIAXone  (ROCEPHIN) 2 g in sodium chloride 0.9 % 100 mL IVPB (2 g Intravenous New Bag/Given 02/26/23 1651)  vancomycin (VANCOCIN) IVPB 1000 mg/200 mL premix (has no administration in time range)    Followed by  vancomycin (VANCOCIN) IVPB 1000 mg/200 mL premix (has no administration in time range)  metoprolol tartrate (LOPRESSOR) tablet 25 mg (has no administration in time range)  metoprolol tartrate (LOPRESSOR) injection 5 mg (5 mg Intravenous Given 02/26/23 1643)     IMPRESSION / MDM / ASSESSMENT AND PLAN / ED COURSE  I reviewed the triage vital signs and the nursing notes.   Patient's presentation is most consistent with acute presentation with potential threat to life or bodily function.   Patient comes in with atrial fibrillation with RVR with concerns for left foot cellulitis potentially underlying osteomyelitis.  Will treat patient's rate with metoprolol given he is on metoprolol at home and he is typically rate controlled with that.  Will get blood cultures, lactate, blood work to evaluate for [employer] abnormalities, x-rays.  At this time I doubt PE given on Eliquis.  Heart rates have come down with the metoprolol will load with some oral metoprolol  White count slightly elevated 11 INR is 1.6.  CMP shows slightly elevated creatinine of 1.76 patient is getting some IV fluids.  Lactate slightly elevated at 3.2.  Patient's heart rates have come down on repeat evaluation.  He states he is feeling better.  I will discuss with podiatry for next steps after uploading patient's foot pictures into the chart.   Discussed with Dr. Ether Griffins initially was going to try outpatient therapy but with patient's ambulation heart rates continue to jump up to 150.  Will admit patient for IV antibiotics, MRI podiatry consultation and management of his A-fib.  The patient is on the cardiac monitor to evaluate for evidence of arrhythmia and/or significant heart rate changes.      FINAL CLINICAL IMPRESSION(S) / ED  DIAGNOSES   Final diagnoses:  Atrial fibrillation with rapid ventricular response (HCC)  Cellulitis of foot     Rx / DC Orders   ED Discharge Orders     None        Note:  This document was prepared using Dragon voice recognition software and may include unintentional dictation errors.   Concha Se, MD 02/26/23 [phone]

## 2023-02-26 NOTE — ED Triage Notes (Signed)
Pt to ED from [facility] for shob, chills, overall not feeling well. Has had toes amputated to left foot in past 6 months. Reports has current wound to left foot. +DM

## 2023-02-26 NOTE — Consult Note (Signed)
PHARMACY -  BRIEF ANTIBIOTIC NOTE   Pharmacy has received consult(s) for vancomycin dosing from an ED provider.  The patient's profile has been reviewed for ht/wt/allergies/indication/available labs.    One time order(s) placed for vancomycin 2000 mg IV x 1  Further antibiotics/pharmacy consults should be ordered by admitting physician if indicated.                       Thank you, Barrie Folk, PharmD 02/26/2023  4:38 PM

## 2023-02-26 NOTE — H&P (Signed)
History and Physical    [name] ZOX:[id number] DOB: [birth year] DOA: 02/26/2023 DOS: the patient was seen and examined on 02/26/2023 PCP: Lynnea Ferrier, MD  Patient coming from: Home  Chief Complaint:  Chief Complaint  Patient presents with   Shortness of Breath    HPI: Andrew Harding is a 72 y.o. male with medical history significant for insulin-dependent DM 2, A-fib on Eliquis, HTN, CAD status post CABG, CKD 3B, PAD s/p transmetatarsal amputation left foot  who is being admitted with a foot wound infection with sepsis and A fib with RVR after presenting with a presyncopal episode.  Patient was last seen by podiatry 2 weeks ago on 02/12/2023 with concern for draining and bleeding from a wound at the bottom of his foot   He was prescribed Santyl dressings.  Since his visit however, he has noticed more redness to the area.  Additionally he has been feeling more fatigued and then today while at work, while going up some stairs he felt like he  became acutely short of breath and felt like he was going to pass out , thus prompting the visit to the ED. ED course and data review: On arrival tachycardic to up to 136 and tachypneic to 22 but afebrile and with SBP in the 130s to 150s. Labs: WBC 11,000 with lactic acid 3.2--1.7. Hemoglobin at baseline at 11.4 Creatinine 1.76 up from baseline of 1.13 about 6 months prior Blood glucose 255 EKG, personally viewed and interpreted showing A-fib at 124 with no ischemic changes. Foot x-ray showing possible osteomyelitis with recommendation for MRI as follows IMPRESSION: Transmetatarsal amputation of the left foot with soft tissue swelling and irregularity about the stump of the first metatarsal. Questionable loss of cortical distinctness about the stumps of the first and second metatarsals. Osteomyelitis is not excluded. Consider MRI further evaluation.  Chest x-ray nonacute  Patient started on vancomycin and Rocephin  Hospitalist  consulted for admission.   Review of Systems: As mentioned in the history of present illness. All other systems reviewed and are negative.  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   Tubular adenoma    Vitamin D deficiency    Past Surgical History:  Procedure Laterality Date   AMPUTATION Left 08/19/2022   Procedure: TRANSMETATARSAL AMPUTATION LEFT FOOT WITH IRRIGATION AND DEBRIDEMENT;  Surgeon: Rosetta Posner, DPM;  Location: [facility] ORS;  Service: Podiatry;  Laterality: Left;   AMPUTATION TOE Right 06/01/2015   Procedure: AMPUTATION TOE;  Surgeon: Recardo Evangelist, DPM;  Location: [facility];  Service: Podiatry;  Laterality: Right;   AMPUTATION TOE Left 08/11/2022   Procedure: AMPUTATION TOE 2, 3, 4;  Surgeon: [name], DPM;  Location: [facility];  Service: Podiatry;  Laterality: Left;   CATARACT EXTRACTION, BILATERAL     CIRCUMCISION N/A 06/12/2022   Procedure: CIRCUMCISION ADULT;  Surgeon: Vanna Scotland, MD;  Location: [facility];  Service: Urology;  Laterality: N/A;   COLONOSCOPY WITH PROPOFOL N/A 02/14/2016   Procedure: COLONOSCOPY WITH PROPOFOL;  Surgeon: Christena Deem, MD;  Location: [facility] END[facility]COPY;  Service: Endoscopy;  Laterality: N/A;   COLONOSCOPY WITH PROPOFOL N/A 01/07/2019   Procedure: COLONOSCOPY WITH PROPOFOL;  Surgeon: Christena Deem, MD;  Location: [facility] ENDOSCOPY;  Service: Endoscopy;  Laterality: N/A;   CORONARY ARTERY BYPASS GRAFT N/A 08/2012   EXCISION PARTIAL PHALANX Right 06/01/2015   Procedure: EXCISION PARTIAL PHALANX /  BONE;  Surgeon: Recardo Evangelist, DPM;  Location: [facility];  Service:  Podiatry;  Laterality: Right;   FLEXIBLE SIGMOIDOSCOPY N/A 05/29/2016   Procedure: FLEXIBLE SIGMOIDOSCOPY;  Surgeon: Christena Deem, MD;  Location: [facility] ENDOSCOPY;  Service: Endoscopy;  Laterality: N/A;   INCISION AND DRAINAGE Left 08/23/2022   Procedure: INCISION AND DRAINAGE;  Surgeon: [name], DPM;  Location: [facility] OR[facility];  Service: Podiatry;  Laterality: Left;   INCISION AND DRAINAGE OF WOUND Left 09/15/2022   Procedure: 11044 - DEBRIDE BONE and EXCISION IF 1ST METATARSAL BONE WITH  DELAY PRIMARY CLOSURE;  Surgeon: [name], DPM;  Location: [facility];  Service: Podiatry;  Laterality: Left;   KNEE ARTHROSCOPY Left    LOWER EXTREMITY ANGIOGRAPHY Left 08/22/2022   Procedure: Lower Extremity Angiography;  Surgeon: Renford Dills, MD;  Location: [facility] INVASIVE CV LAB;  Service: Cardiovascular;  Laterality: Left;   WOUND DEBRIDEMENT Left 09/15/2022   Procedure: 11043 - DEBRIDE SKIN. MUSCLE FASCIA;  Surgeon: [name], DPM;  Location: [facility];  Service: Podiatry;  Laterality: Left;   Social History:  reports that he has quit smoking. His smoking use included cigars. He has never used smokeless tobacco. He reports that he does not drink alcohol and does not use drugs.  No Known Allergies  Family History  Problem Relation Age of Onset   Diabetes Mellitus II Mother    CAD Mother     Prior to Admission medications   Medication Sig Start Date End Date Taking? Authorizing Provider  acetaminophen (TYLENOL) 325 MG tablet Take 1-2 tablets (325-650 mg total) by mouth every 4 (four) hours as needed for mild pain. 08/30/22   Love, Evlyn Kanner, PA-C  amoxicillin-clavulanate (AUGMENTIN) 875-125 MG tablet Take 1 tablet by mouth 2 (two) times daily. Patient not taking: Reported on 09/28/2022 09/19/22   Lynn Ito, MD  apixaban (ELIQUIS) 5 MG TABS tablet Take 1 tablet (5 mg total) by mouth 2 (two) times daily. 10/11/22   Georgiana Spinner, NP  ascorbic acid (VITAMIN C) 500 MG tablet Take 1  tablet (500 mg total) by mouth 2 (two) times daily. 09/05/22   Love, Evlyn Kanner, PA-C  cholecalciferol (VITAMIN D) 1000 units tablet Take 2,000 Units by mouth daily. Patient not taking: Reported on 09/28/2022    [provider]  ciprofloxacin (CIPRO) 500 MG tablet Take 1 tablet (500 mg total) by mouth 2 (two) times daily. 09/19/22   Lynn Ito, MD  clopidogrel (PLAVIX) 75 MG tablet Take 1 tablet (75 mg total) by mouth daily with breakfast. 10/11/22   Georgiana Spinner, NP  Continuous Glucose Sensor (FREESTYLE LIBRE 2 SENSOR) MISC  09/05/22   [provider]  docusate sodium (COLACE) 100 MG capsule Take 1 capsule (100 mg total) by mouth 2 (two) times daily. 09/05/22   Love, Evlyn Kanner, PA-C  gabapentin (NEURONTIN) 300 MG capsule Take 1 capsule (300 mg total) by mouth 3 (three) times daily. 09/05/22   Love, Evlyn Kanner, PA-C  glucose blood (ONETOUCH ULTRA) test strip Use 3 (three) times daily 09/09/18   [provider]  insulin glargine (LANTUS) 100  UNIT/ML Solostar Pen Inject 36 Units into the skin at bedtime. Patient taking differently: Inject 26 Units into the skin at bedtime. 09/05/22   Love, Evlyn Kanner, PA-C  Insulin Pen Needle (FIFTY50 PEN NEEDLES) 32G X 4 MM MISC USE 4 TIMES DAILY 07/29/18   [provider]  metoprolol tartrate (LOPRESSOR) 25 MG tablet Take 1 tablet (25 mg total) by mouth 2 (two) times daily. 09/05/22   Love, Evlyn Kanner, PA-C  NOVOLOG FLEXPEN 100 UNIT/ML FlexPen Use the sliding scale insulin Patient not taking: Reported on 09/14/2022 09/05/22   Love, Evlyn Kanner, PA-C  nutrition supplement, JUVEN, (JUVEN) PACK Take 1 packet by mouth 2 (two) times daily between meals. Purchase over the counter or on [employer] Patient not taking: Reported on 09/28/2022 09/05/22   Love, Evlyn Kanner, PA-C  nystatin (MYCOSTATIN) 100000 UNIT/ML suspension Take 5 mLs (500,000 Units total) by mouth 4 (four) times daily. 09/05/22   Love, Evlyn Kanner, PA-C  OZEMPIC, 0.25 OR 0.5 MG/DOSE, 2 MG/3ML SOPN  Inject 0.75 mLs into the skin once a week.    [provider]  pravastatin (PRAVACHOL) 80 MG tablet TAKE ONE TABLET BY MOUTH AT NIGHT 07/08/18   [provider]  silver sulfADIAZINE (SILVADENE) 1 % cream Apply topically. 07/31/22   [provider]  traZODone (DESYREL) 50 MG tablet TAKE ONE TABLET AT BEDTIME AS NEEDED 10/21/18   [provider]  TRULICITY 4.5 MG/0.5ML SOPN Inject into the skin. Patient not taking: Reported on 09/28/2022 07/10/22   [provider]  Zinc Sulfate 220 (50 Zn) MG TABS Take 1 tablet (220 mg total) by mouth daily with supper. 09/05/22   Jacquelynn Cree, PA-C    Physical Exam: Vitals:   02/26/23 2100 02/26/23 2130 02/26/23 2200 02/26/23 2340  BP: (!) 108/48 (!) 113/56 (!) 132/57   Pulse: 92 91 (!) 101   Resp: (!) 21 (!) 23 17   Temp:    98.6 F (37 C)  TempSrc:    Oral  SpO2: 97% 98% 99%   Weight:      Height:       Physical Exam Vitals and nursing note reviewed.  Constitutional:      General: He is not in acute distress. HENT:     Head: Normocephalic and atraumatic.  Cardiovascular:     Rate and Rhythm: Regular rhythm. Tachycardia present.     Heart sounds: Normal heart sounds.  Pulmonary:     Effort: Tachypnea present.     Breath sounds: Normal breath sounds.  Abdominal:     Palpations: Abdomen is soft.     Tenderness: There is no abdominal tenderness.  Musculoskeletal:     Comments: See pic below  Neurological:     Mental Status: Mental status is at baseline.        Labs on Admission: I have personally reviewed following labs and imaging studies  CBC: Recent Labs  Lab 02/26/23 1630  WBC 11.1*  NEUTROABS 10.1*  HGB 11.4*  HCT 35.1*  MCV 89.8  PLT 357   Basic Metabolic Panel: Recent Labs  Lab 02/26/23 1630  NA 132*  K 4.3  CL 97*  CO2 23  GLUCOSE 255*  BUN 23  CREATININE 1.76*  CALCIUM 8.9   GFR: Estimated Creatinine Clearance: 44.7 mL/min (A) (by C-G formula based on SCr of 1.76  mg/dL (H)). Liver Function Tests: Recent Labs  Lab 02/26/23 1630  AST 27  ALT 28  ALKPHOS 64  BILITOT 0.8  PROT 8.4*  ALBUMIN 3.7   No results for input(s): "LIPASE", "AMYLASE" in the last 168 hours. No results for input(s): "AMMONIA" in the last 168 hours. Coagulation Profile: Recent Labs  Lab 02/26/23 1630  INR 1.6*   Cardiac Enzymes: No results for input(s): "CKTOTAL", "CKMB", "CKMBINDEX", "TROPONINI" in the last 168 hours. BNP (last 3 results) No results for input(s): "PROBNP" in the last 8760 hours. HbA1C: No results for input(s): "HGBA1C" in the last 72 hours. CBG: No results for input(s): "GLUCAP" in the last 168 hours. Lipid Profile: No results for input(s): "CHOL", "HDL", "LDLCALC", "TRIG", "CHOLHDL", "LDLDIRECT" in the last 72 hours. Thyroid Function Tests: No results for input(s): "TSH", "T4TOTAL", "FREET4", "T3FREE", "THYROIDAB" in the last 72 hours. Anemia Panel: No results for input(s): "VITAMINB12", "FOLATE", "FERRITIN", "TIBC", "IRON", "RETICCTPCT" in the last 72 hours. Urine analysis:    Component Value Date/Time   COLORURINE YELLOW (A) 08/17/2022 2100   APPEARANCEUR HAZY (A) 08/17/2022 2100   APPEARANCEUR Hazy (A) 05/30/2022 1026   LABSPEC 1.041 (H) 08/17/2022 2100   PHURINE 5.0 08/17/2022 2100   GLUCOSEU 50 (A) 08/17/2022 2100   HGBUR SMALL (A) 08/17/2022 2100   BILIRUBINUR NEGATIVE 08/17/2022 2100   BILIRUBINUR Negative 05/30/2022 1026   KETONESUR NEGATIVE 08/17/2022 2100   PROTEINUR 30 (A) 08/17/2022 2100   UROBILINOGEN 0.2 04/05/2018 1042   NITRITE NEGATIVE 08/17/2022 2100   LEUKOCYTESUR NEGATIVE 08/17/2022 2100    Radiological Exams on Admission: DG Foot Complete Left  Result Date: 02/26/2023 CLINICAL DATA:  Left foot wounds, sores, redness, leaking EXAM: LEFT FOOT - COMPLETE 3+ VIEW COMPARISON:  09/02/2022 FINDINGS: Transmetatarsal amputation of the left foot. Swelling about the distal stump and soft tissue irregularity about the stump  of the first metatarsal. Questionable loss of cortical distinctness about the stumps of the first and second metatarsals. Vascular calcifications. IMPRESSION: Transmetatarsal amputation of the left foot with soft tissue swelling and irregularity about the stump of the first metatarsal. Questionable loss of cortical distinctness about the stumps of the first and second metatarsals. Osteomyelitis is not excluded. Consider MRI further evaluation. Electronically Signed   By: Minerva Fester M.D.   On: 02/26/2023 20:39   DG Chest Portable 1 View  Result Date: 02/26/2023 CLINICAL DATA:  Shortness of breath and pain EXAM: PORTABLE CHEST 1 VIEW COMPARISON:  08/25/2022 FINDINGS: Sternotomy and CABG. Stable cardiomediastinal silhouette. No focal consolidation, pleural effusion, or pneumothorax. No displaced rib fractures. IMPRESSION: No active disease. Electronically Signed   By: Minerva Fester M.D.   On: 02/26/2023 20:36     Data Reviewed: Relevant notes from primary care and specialist visits, past discharge summaries as available in EHR, including Care Everywhere. Prior diagnostic testing as pertinent to current admission diagnoses Updated medications and problem lists for reconciliation ED course, including vitals, labs, imaging, treatment and response to treatment Triage notes, nursing and pharmacy notes and ED provider's notes Notable results as noted in HPI   Assessment and Plan: * Postural dizziness with presyncope Secondary to sepsis and A-fib with RVR See treatment for both on the each problem Continuous cardiac monitoring and neurologic checks  Diabetic foot infection with possible osteomyelitis, left  (HCC) Sepsis PAD s/p left transmetatarsal amputation 07/2022 Sepsis criteria include tachycardia, tachypnea, leukocytosis and lactic acidosis with concerns for wound infection and possible osteomyelitis Continue Rocephin and vancomycin Sepsis fluids Podiatry consult  Atrial fibrillation  with RVR (HCC) Likely driven by sepsis Expecting improvement with IV fluids for treatment of sepsis and continuation of home metoprolol Continuous cardiac monitoring  Will hold Eliquis in anticipation of possible wound debridement  AKI (acute kidney injury) (HCC) creatinine 1.76 up from baseline of 1.13 about 6 months prior Likely prerenal, related to sepsis Expecting improvement with IV hydration Monitor renal function and avoid nephrotoxins  Controlled type 2 diabetes mellitus with neuropathy (HCC) Basal insulin with sliding scale insulin coverage Gabapentin  CAD S/P CABG x 4 PAD s/p left transmetatarsal amputation Continue clopidogrel, metoprolol, statin    DVT prophylaxis: SCD  Consults: podiatry  Advance Care Planning:   Code Status: Prior   Family Communication: none  Disposition Plan: Back to previous home environment  Severity of Illness: The appropriate patient status for this patient is INPATIENT. Inpatient status is judged to be reasonable and necessary in order to provide the required intensity of service to ensure the patient's safety. The patient's presenting symptoms, physical exam findings, and initial radiographic and laboratory data in the context of their chronic comorbidities is felt to place them at high risk for further clinical deterioration. Furthermore, it is not anticipated that the patient will be medically stable for discharge from the hospital within 2 midnights of admission.   * I certify that at the point of admission it is my clinical judgment that the patient will require inpatient hospital care spanning beyond 2 midnights from the point of admission due to high intensity of service, high risk for further deterioration and high frequency of surveillance required.*  Author: Andris Baumann, MD 02/26/2023 11:43 PM  For on call review [email or web address].

## 2023-02-27 ENCOUNTER — Inpatient Hospital Stay
Admit: 2023-02-27 | Discharge: 2023-02-27 | Disposition: A | Discharge status: Home / Self Care | Payer: Managed Care, Other (non HMO) | Attending: Internal Medicine | Admitting: Internal Medicine

## 2023-02-27 DIAGNOSIS — R55 Syncope and collapse: Secondary | ICD-10-CM | POA: Diagnosis not present

## 2023-02-27 DIAGNOSIS — R42 Dizziness and giddiness: Secondary | ICD-10-CM | POA: Diagnosis not present

## 2023-02-27 DIAGNOSIS — R7881 Bacteremia: Secondary | ICD-10-CM

## 2023-02-27 DIAGNOSIS — E1169 Type 2 diabetes mellitus with other specified complication: Secondary | ICD-10-CM | POA: Diagnosis not present

## 2023-02-27 DIAGNOSIS — B9561 Methicillin susceptible Staphylococcus aureus infection as the cause of diseases classified elsewhere: Secondary | ICD-10-CM

## 2023-02-27 DIAGNOSIS — E1140 Type 2 diabetes mellitus with diabetic neuropathy, unspecified: Secondary | ICD-10-CM | POA: Insufficient documentation

## 2023-02-27 DIAGNOSIS — M86272 Subacute osteomyelitis, left ankle and foot: Secondary | ICD-10-CM | POA: Diagnosis not present

## 2023-02-27 DIAGNOSIS — Z794 Long term (current) use of insulin: Secondary | ICD-10-CM

## 2023-02-27 LAB — BLOOD CULTURE ID PANEL (REFLEXED) - BCID2

## 2023-02-27 LAB — ECHOCARDIOGRAM COMPLETE
AR max vel: 2.61 cm2
AV Area VTI: 2.63 cm2
AV Area mean vel: 2.42 cm2
AV Mean grad: 3 mm[Hg]
AV Peak grad: 4.6 mm[Hg]
Ao pk vel: 1.08 m/s
Area-P 1/2: 4.1 cm2
Height: 75 in
MV VTI: 2.27 cm2
S' Lateral: 2.5 cm
Weight: 3421.54 [oz_av]

## 2023-02-27 LAB — PROTIME-INR
INR: 1.6 — ABNORMAL HIGH (ref 0.8–1.2)
Prothrombin Time: 19.1 s — ABNORMAL HIGH (ref 11.4–15.2)

## 2023-02-27 LAB — SURGICAL PCR SCREEN
MRSA, PCR: NEGATIVE
Staphylococcus aureus: POSITIVE — AB

## 2023-02-27 LAB — CBG MONITORING, ED
Glucose-Capillary: 202 mg/dL — ABNORMAL HIGH (ref 70–99)
Glucose-Capillary: 172 mg/dL — ABNORMAL HIGH (ref 70–99)
Glucose-Capillary: 154 mg/dL — ABNORMAL HIGH (ref 70–99)

## 2023-02-27 LAB — CORTISOL-AM, BLOOD: Cortisol - AM: 16.8 ug/dL (ref 6.7–22.6)

## 2023-02-27 LAB — CBC
HCT: 29.5 % — ABNORMAL LOW (ref 39.0–52.0)
Hemoglobin: 9.9 g/dL — ABNORMAL LOW (ref 13.0–17.0)
MCH: 29.8 pg (ref 26.0–34.0)
MCHC: 33.6 g/dL (ref 30.0–36.0)
MCV: 88.9 fL (ref 80.0–100.0)
Platelets: 265 10*3/uL (ref 150–400)
RBC: 3.32 MIL/uL — ABNORMAL LOW (ref 4.22–5.81)
RDW: 12.5 % (ref 11.5–15.5)
WBC: 9.2 10*3/uL (ref 4.0–10.5)
nRBC: 0 % (ref 0.0–0.2)

## 2023-02-27 LAB — URINALYSIS, W/ REFLEX TO CULTURE (INFECTION SUSPECTED)
Bilirubin Urine: NEGATIVE
Glucose, UA: 50 mg/dL — AB
Ketones, ur: NEGATIVE mg/dL
Leukocytes,Ua: NEGATIVE
Nitrite: NEGATIVE
Protein, ur: NEGATIVE mg/dL
Specific Gravity, Urine: 1.013 (ref 1.005–1.030)
pH: 5 (ref 5.0–8.0)

## 2023-02-27 LAB — GLUCOSE, CAPILLARY
Glucose-Capillary: 154 mg/dL — ABNORMAL HIGH (ref 70–99)
Glucose-Capillary: 130 mg/dL — ABNORMAL HIGH (ref 70–99)

## 2023-02-27 LAB — BASIC METABOLIC PANEL
Anion gap: 7 (ref 5–15)
BUN: 18 mg/dL (ref 8–23)
CO2: 23 mmol/L (ref 22–32)
Calcium: 8.2 mg/dL — ABNORMAL LOW (ref 8.9–10.3)
Chloride: 101 mmol/L (ref 98–111)
Creatinine, Ser: 1.39 mg/dL — ABNORMAL HIGH (ref 0.61–1.24)
GFR, Estimated: 54 mL/min — ABNORMAL LOW (ref >60)
Glucose, Bld: 177 mg/dL — ABNORMAL HIGH (ref 70–99)
Potassium: 3.8 mmol/L (ref 3.5–5.1)
Sodium: 131 mmol/L — ABNORMAL LOW (ref 135–145)

## 2023-02-27 LAB — PROCALCITONIN: Procalcitonin: 0.52 ng/mL

## 2023-02-27 LAB — HEMOGLOBIN A1C
Hgb A1c MFr Bld: 7.5 % — ABNORMAL HIGH (ref 4.8–5.6)
Mean Plasma Glucose: 168.55 mg/dL

## 2023-02-27 MED ORDER — VANCOMYCIN HCL 1500 MG/300ML IV SOLN
1500.0000 mg | INTRAVENOUS | Status: DC
  Filled 2023-02-27: qty 300

## 2023-02-27 MED ORDER — GABAPENTIN 300 MG PO CAPS
300.0000 mg | ORAL_CAPSULE | Freq: Three times a day (TID) | ORAL | Status: DC
  Administered 2023-02-27 – 2023-03-05 (×18): 300 mg via ORAL
  Filled 2023-02-27 (×18): qty 1

## 2023-02-27 MED ORDER — MUPIROCIN 2 % EX OINT
1.0000 | TOPICAL_OINTMENT | Freq: Two times a day (BID) | CUTANEOUS | Status: AC
  Administered 2023-02-27 – 2023-03-04 (×10): 1 via NASAL
  Filled 2023-02-27: qty 22

## 2023-02-27 MED ORDER — TRAZODONE HCL 50 MG PO TABS
50.0000 mg | ORAL_TABLET | Freq: Every evening | ORAL | Status: DC | PRN
  Administered 2023-02-27 – 2023-03-03 (×2): 50 mg via ORAL
  Filled 2023-02-27 (×2): qty 1

## 2023-02-27 MED ORDER — ACETAMINOPHEN 325 MG PO TABS: 650.0000 mg | ORAL_TABLET | Freq: Four times a day (QID) | ORAL | Status: DC | PRN

## 2023-02-27 MED ORDER — HYDROCODONE-ACETAMINOPHEN 5-325 MG PO TABS
1.0000 | ORAL_TABLET | ORAL | Status: DC | PRN
Start: 2023-02-27 — End: 2023-03-05

## 2023-02-27 MED ORDER — CEFAZOLIN SODIUM-DEXTROSE 2-4 GM/100ML-% IV SOLN
2.0000 g | Freq: Three times a day (TID) | INTRAVENOUS | Status: DC
  Administered 2023-02-27 – 2023-03-05 (×19): 2 g via INTRAVENOUS
  Filled 2023-02-27 (×23): qty 100

## 2023-02-27 MED ORDER — ONDANSETRON HCL 4 MG PO TABS: 4.0000 mg | ORAL_TABLET | Freq: Four times a day (QID) | ORAL | Status: DC | PRN

## 2023-02-27 MED ORDER — LACTATED RINGERS IV SOLN
150.0000 mL/h | INTRAVENOUS | Status: AC
Start: 2023-02-27 — End: 2023-02-27
  Administered 2023-02-27 (×3): 150 mL/h via INTRAVENOUS

## 2023-02-27 MED ORDER — INSULIN ASPART 100 UNIT/ML IJ SOLN
0.0000 [IU] | INTRAMUSCULAR | Status: DC
Start: 2023-02-27 — End: 2023-02-27
  Administered 2023-02-27: 3 [IU] via SUBCUTANEOUS
  Filled 2023-02-27: qty 1

## 2023-02-27 MED ORDER — PRAVASTATIN SODIUM 40 MG PO TABS
80.0000 mg | ORAL_TABLET | Freq: Every day | ORAL | Status: DC
  Administered 2023-02-27 – 2023-03-04 (×6): 80 mg via ORAL
  Filled 2023-02-27 (×7): qty 2

## 2023-02-27 MED ORDER — ACETAMINOPHEN 650 MG RE SUPP: 650.0000 mg | Freq: Four times a day (QID) | RECTAL | Status: DC | PRN

## 2023-02-27 MED ORDER — ONDANSETRON HCL 4 MG/2ML IJ SOLN: 4.0000 mg | Freq: Four times a day (QID) | INTRAMUSCULAR | Status: DC | PRN

## 2023-02-27 MED ORDER — INSULIN ASPART 100 UNIT/ML IJ SOLN
0.0000 [IU] | Freq: Three times a day (TID) | INTRAMUSCULAR | Status: DC
  Administered 2023-02-27 – 2023-02-28 (×3): 3 [IU] via SUBCUTANEOUS
  Administered 2023-02-28: 2 [IU] via SUBCUTANEOUS
  Administered 2023-03-01: 8 [IU] via SUBCUTANEOUS
  Administered 2023-03-01 – 2023-03-02 (×3): 5 [IU] via SUBCUTANEOUS
  Administered 2023-03-02 – 2023-03-03 (×4): 3 [IU] via SUBCUTANEOUS
  Administered 2023-03-04: 5 [IU] via SUBCUTANEOUS
  Administered 2023-03-04 (×2): 3 [IU] via SUBCUTANEOUS
  Administered 2023-03-05: 2 [IU] via SUBCUTANEOUS
  Filled 2023-02-27 (×15): qty 1

## 2023-02-27 MED ORDER — SODIUM CHLORIDE 0.9 % IV SOLN: 2.0000 g | INTRAVENOUS | Status: DC

## 2023-02-27 MED ORDER — CHLORHEXIDINE GLUCONATE CLOTH 2 % EX PADS
6.0000 | MEDICATED_PAD | Freq: Every day | CUTANEOUS | Status: DC
Start: 2023-02-27 — End: 2023-03-05
  Administered 2023-02-27 – 2023-03-05 (×7): 6 via TOPICAL

## 2023-02-27 MED ORDER — INSULIN GLARGINE-YFGN 100 UNIT/ML ~~LOC~~ SOLN
20.0000 [IU] | Freq: Every day | SUBCUTANEOUS | Status: DC
  Administered 2023-02-27 – 2023-03-01 (×4): 20 [IU] via SUBCUTANEOUS
  Filled 2023-02-27 (×7): qty 0.2

## 2023-02-27 MED ORDER — METOPROLOL TARTRATE 25 MG PO TABS
25.0000 mg | ORAL_TABLET | Freq: Two times a day (BID) | ORAL | Status: DC
  Administered 2023-02-27 – 2023-03-05 (×14): 25 mg via ORAL
  Filled 2023-02-27 (×14): qty 1

## 2023-02-27 NOTE — Assessment & Plan Note (Signed)
Secondary to sepsis and A-fib with RVR See treatment for both on the each problem Continuous cardiac monitoring and neurologic checks

## 2023-02-27 NOTE — ED Notes (Signed)
Pt has blood that has soaked through his L sock. Pt states when he went to bed last night it was not bleeding, and when he woke up it was. Misty Stanley RN notified.

## 2023-02-27 NOTE — Progress Notes (Signed)
Upon arrival to unit, it was confirmed with patient with [name] Dalton charge RN present that patient's gun was sent home with his wife yesterday evening.

## 2023-02-27 NOTE — Progress Notes (Signed)
Progress Note   [name] BJY:[id number] DOB: May 20, 1950 DOA: 02/26/2023     1 DOS: the patient was seen and examined on 02/27/2023   Brief hospital course: Andrew Harding is a 72 y.o. male with medical history significant for insulin-dependent DM 2, A-fib on Eliquis, HTN, CAD status post CABG, CKD 3B, PAD s/p transmetatarsal amputation left foot  who is being admitted with a foot wound infection with sepsis and A fib with RVR after presenting with a presyncopal episode.  Patient was last seen by podiatry 2 weeks ago on 02/12/2023 with concern for draining and bleeding from a wound at the bottom of his foot   He was prescribed Santyl dressings.  Since his visit however, he has noticed more redness to the area.  Additionally he has been feeling more fatigued and then today while at work, while going up some stairs he felt like he  became acutely short of breath and felt like he was going to pass out , thus prompting the visit to the ED. ED course and data review: On arrival tachycardic to up to 136 and tachypneic to 22 but afebrile and with SBP in the 130s to 150s. Labs: WBC 11,000 with lactic acid 3.2--1.7. Hemoglobin at baseline at 11.4 Creatinine 1.76 up from baseline of 1.13 about 6 months prior Blood glucose 255 EKG, personally viewed and interpreted showing A-fib at 124 with no ischemic changes. Foot x-ray showing possible osteomyelitis with recommendation for MRI as follows IMPRESSION: Transmetatarsal amputation of the left foot with soft tissue swelling and irregularity about the stump of the first metatarsal. Questionable loss of cortical distinctness about the stumps of the first and second metatarsals. Osteomyelitis is not excluded. Consider MRI further evaluation.   Chest x-ray nonacute   Patient started on vancomycin and Rocephin   Hospitalist consulted for admission.     Assessment and Plan:  Severe sepsis with acute organ dysfunction (AKI) Diabetic foot infection  with possible osteomyelitis, left  (HCC) PAD s/p left transmetatarsal amputation 07/2022 MSSA Bacteremia Sepsis criteria include tachycardia, tachypnea, leukocytosis and lactic acidosis with concerns for wound infection and possible osteomyelitis Blood cultures yielded MSSA Discontinue Rocephin and vancomycin and start patient on Ancef Patient serum creatinine on admission was 1.76 and has improved with IV fluid hydration Continue sepsis fluids. Obtain 2D cardiogram to rule out vegetation Consult infectious disease Continue Rocephin and vancomycin Awaiting podiatry input     * Postural dizziness with presyncope Secondary to sepsis and A-fib with RVR Improved Continuous cardiac monitoring and neurologic checks      Atrial fibrillation with RVR (HCC) Likely driven by sepsis Improved with IV fluids for treatment of sepsis and continuation of home metoprolol Continuous cardiac monitoring Eliquis on hold in anticipation of possible wound debridement Patient not on any heparin bridge due to active bleeding from his TMA stump    AKI (acute kidney injury) (HCC) creatinine 1.76 up from baseline of 1.13 about 6 months prior Likely prerenal, related to sepsis Renal function has improved with IV fluid hydration Monitor renal function and avoid nephrotoxins    Controlled type 2 diabetes mellitus with neuropathy (HCC) Continue basal insulin with sliding scale insulin coverage Continue gabapentin    CAD S/P CABG x 4 PAD s/p left transmetatarsal amputation Continue metoprolol and statin Plavix is on hold for possible procedure         Subjective: Patient is seen and examined at the bedside.  Continues to spike a low-grade fever with a Tmax  of 99.9  Physical Exam: Vitals:   02/27/23 0730 02/27/23 0750 02/27/23 1100 02/27/23 1212  BP: 112/78  122/85   Pulse: 83  79   Resp: (!) 25  18   Temp:  99.9 F (37.7 C)  99.8 F (37.7 C)  TempSrc:  Oral  Oral  SpO2: 98%  97%    Weight:      Height:       Vitals and nursing note reviewed.  Constitutional:      General: He is not in acute distress. HENT:     Head: Normocephalic and atraumatic.  Cardiovascular:     Rate and Rhythm: Irregularly irregular    Heart sounds: Normal heart sounds.  Pulmonary:     Effort: Tachypnea present.     Breath sounds: Normal breath sounds.  Abdominal:     Palpations: Abdomen is soft.     Tenderness: There is no abdominal tenderness.  Musculoskeletal:     Comments: See pic below  Neurological:     Mental Status: Mental status is at baseline.            Data Reviewed: Labs reviewed.  Hemoglobin 9.9, creatinine 1.39 There are no new results to review at this time.  Family Communication: Plan of care discussed with patient in detail.  He verbalizes understanding and agrees with the plan  Disposition: Status is: Inpatient Remains inpatient appropriate because: Needs IV antibiotics  Planned Discharge Destination:  TBD    Time spent: 35 minutes  Author: Lucile Shutters, MD 02/27/2023 2:08 PM  For on call review [email or web address].

## 2023-02-27 NOTE — Progress Notes (Signed)
Upon arrival to unit, it was confirmed with patient with Andrew Harding charge RN present that patient's gun was sent home with his wife yesterday evening.  ADDENDUM: "verbally" asked the patient whether he had the gun on him and he said no.  Did not look in patients private property.

## 2023-02-27 NOTE — Progress Notes (Signed)
Security to the floor when patient arrived to help confirm the gun had been taken out of the hospital.    Security confirmed Andrew Harding Designer, fashion/clothing) from [employer] confirmed patients wife took the gun home.

## 2023-02-27 NOTE — Consult Note (Signed)
ORTHOPAEDIC CONSULTATION  REQUESTING PHYSICIAN: Lucile Shutters, MD  Chief Complaint: Left foot wound and infection  HPI: Andrew Harding is a 72 y.o. male who complains of worsening redness and drainage to his left foot.  Longstanding patient of mine.  He is undergone previous transmetatarsal amputation for gangrenous changes and infection.  He has been slow to heal.  He underwent revascularization via vascular surgery as well.  Presented after a presyncopal episode with a failed.  Currently resting comfortably in the ER.  Upon admission ulcerations to the left foot were noted with surrounding redness and drainage to the previous amputation site.  X-rays have been taken and were concerning for osteomyelitis.  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   Tubular adenoma    Vitamin D deficiency    Past Surgical History:  Procedure Laterality Date   AMPUTATION Left 08/19/2022   Procedure: TRANSMETATARSAL AMPUTATION LEFT FOOT WITH IRRIGATION AND DEBRIDEMENT;  Surgeon: Rosetta Posner, DPM;  Location: [facility] ORS;  Service: Podiatry;  Laterality: Left;   AMPUTATION TOE Right 06/01/2015   Procedure: AMPUTATION TOE;  Surgeon: Recardo Evangelist, DPM;  Location: [facility];  Service: Podiatry;  Laterality: Right;   AMPUTATION TOE Left 08/11/2022   Procedure: AMPUTATION TOE 2, 3, 4;  Surgeon: [name], DPM;  Location: [facility];  Service: Podiatry;  Laterality: Left;   CATARACT EXTRACTION, BILATERAL     CIRCUMCISION N/A 06/12/2022   Procedure: CIRCUMCISION ADULT;  Surgeon: Vanna Scotland, MD;  Location: [facility];  Service: Urology;   Laterality: N/A;   COLONOSCOPY WITH PROPOFOL N/A 02/14/2016   Procedure: COLONOSCOPY WITH PROPOFOL;  Surgeon: Christena Deem, MD;  Location: [facility] ENDOSCOPY;  Service: Endoscopy;  Laterality: N/A;   COLONOSCOPY WITH PROPOFOL N/A 01/07/2019   Procedure: COLONOSCOPY WITH PROPOFOL;  Surgeon: Christena Deem, MD;  Location: [facility] ENDOSCOPY;  Service: Endoscopy;  Laterality: N/A;   CORONARY ARTERY BYPASS GRAFT N/A 08/2012   EXCISION PARTIAL PHALANX Right 06/01/2015   Procedure: EXCISION PARTIAL PHALANX /  BONE;  Surgeon: Recardo Evangelist, DPM;  Location: [facility];  Service: Podiatry;  Laterality: Right;   FLEXIBLE SIGMOIDOSCOPY N/A 05/29/2016   Procedure: FLEXIBLE SIGMOIDOSCOPY;  Surgeon: Christena Deem, MD;  Location: [facility] ENDOSCOPY;  Service: Endoscopy;  Laterality: N/A;   INCISION AND DRAINAGE Left 08/23/2022   Procedure: INCISION AND DRAINAGE;  Surgeon: [name], DPM;  Location: [facility] OR[facility];  Service: Podiatry;  Laterality: Left;   INCISION AND DRAINAGE OF WOUND Left 09/15/2022   Procedure: 11044 - DEBRIDE BONE and EXCISION IF 1ST METATARSAL BONE WITH  DELAY PRIMARY CLOSURE;  Surgeon: [name], DPM;  Location: [facility];  Service: Podiatry;  Laterality: Left;   KNEE ARTHROSCOPY Left    LOWER EXTREMITY ANGIOGRAPHY Left 08/22/2022   Procedure: Lower Extremity Angiography;  Surgeon: Renford Dills, MD;  Location: [facility] INVASIVE CV LAB;  Service: Cardiovascular;  Laterality: Left;   WOUND DEBRIDEMENT Left 09/15/2022   Procedure: 11043 - DEBRIDE SKIN. MUSCLE FASCIA;  Surgeon: [name], DPM;  Location: [facility]  ORS;  Service: Podiatry;  Laterality: Left;   Social History   Socioeconomic History   Marital status: Divorced    Spouse name: Ashley,Angela C   Number of children: Not on file   Years of education: Not on file   Highest education level: Not on file  Occupational History   Not on file  Tobacco Use   Smoking status: Former    Types: Cigars   Smokeless tobacco: Never   Vaping Use   Vaping status: Never Used  Substance and Sexual Activity   Alcohol use: No   Drug use: No   Sexual activity: Never  Other Topics Concern   Not on file  Social History Narrative   Patient is legally separated and lives at home by himself. His ex -wife is his HCPOA. Neighbors and friends are his support system. He used to work for [employer]    Social Determinants of Health   Financial Resource Strain: Low Risk  (12/13/2022)   Received from [facility]   Overall Financial Resource Strain (CARDIA)    Difficulty of Paying Living Expenses: Not hard at all  Food Insecurity: No Food Insecurity (12/13/2022)   Received from [facility]   Hunger Vital Sign    Worried About Running Out of Food in the Last Year: Never true    Ran Out of Food in the Last Year: Never true  Transportation Needs: No Transportation Needs (12/13/2022)   Received from [facility] - Transportation    In the past 12 months, has lack of transportation kept you from medical appointments or from getting medications?: No    Lack of Transportation (Non-Medical): No  Physical Activity: Not on file  Stress: Not on file  Social Connections: Not on file   Family History  Problem Relation Age of Onset   Diabetes Mellitus II Mother    CAD Mother    No Known Allergies Prior to Admission medications   Medication Sig Start Date End Date Taking? Authorizing Provider  apixaban (ELIQUIS) 5 MG TABS tablet Take 1 tablet (5 mg total) by mouth 2 (two) times daily. 10/11/22  Yes Georgiana Spinner, NP  clopidogrel (PLAVIX) 75 MG tablet Take 1 tablet (75 mg total) by mouth daily with breakfast. 10/11/22  Yes Georgiana Spinner, NP  docusate sodium (COLACE) 100 MG capsule Take 1 capsule (100 mg total) by mouth 2 (two) times daily. 09/05/22  Yes Love, Evlyn Kanner, PA-C  gabapentin (NEURONTIN) 300 MG capsule Take 1 capsule (300 mg total) by mouth 3  (three) times daily. 09/05/22  Yes Love, Evlyn Kanner, PA-C  metFORMIN (GLUCOPHAGE-XR) 500 MG 24 hr tablet Take 2,000 mg by mouth every evening. Take with dinner.   Yes [provider]  metoprolol tartrate (LOPRESSOR) 25 MG tablet Take 1 tablet (25 mg total) by mouth 2 (two) times daily. 09/05/22  Yes Love, Evlyn Kanner, PA-C  pravastatin (PRAVACHOL) 80 MG tablet TAKE ONE TABLET BY MOUTH AT NIGHT 07/08/18  Yes [provider]  traZODone (DESYREL) 50 MG tablet TAKE ONE TABLET AT BEDTIME AS NEEDED 10/21/18  Yes [provider]  acetaminophen (TYLENOL) 325 MG tablet Take 1-2 tablets (325-650 mg total) by mouth every 4 (four) hours as needed for mild pain. 08/30/22   Love, Evlyn Kanner, PA-C  amoxicillin-clavulanate (AUGMENTIN) 875-125 MG tablet Take 1 tablet by mouth 2 (two) times daily. Patient not taking: Reported on 09/28/2022 09/19/22   Lynn Ito,  MD  ascorbic acid (VITAMIN C) 500 MG tablet Take 1 tablet (500 mg total) by mouth 2 (two) times daily. Patient not taking: Reported on 02/27/2023 09/05/22   Jacquelynn Cree, PA-C  cholecalciferol (VITAMIN D) 1000 units tablet Take 2,000 Units by mouth daily. Patient not taking: Reported on 09/28/2022    [provider]  ciprofloxacin (CIPRO) 500 MG tablet Take 1 tablet (500 mg total) by mouth 2 (two) times daily. Patient not taking: Reported on 02/27/2023 09/19/22   Lynn Ito, MD  Continuous Glucose Sensor (FREESTYLE LIBRE 2 SENSOR) MISC  09/05/22   [provider]  glucose blood (ONETOUCH ULTRA) test strip Use 3 (three) times daily 09/09/18   [provider]  insulin glargine (LANTUS) 100 UNIT/ML Solostar Pen Inject 36 Units into the skin at bedtime. Patient taking differently: Inject 26 Units into the skin at bedtime. 09/05/22   Love, Evlyn Kanner, PA-C  Insulin Pen Needle (FIFTY50 PEN NEEDLES) 32G X 4 MM MISC USE 4 TIMES DAILY 07/29/18   [provider]  NOVOLOG FLEXPEN 100 UNIT/ML FlexPen Use the  sliding scale insulin Patient not taking: Reported on 09/14/2022 09/05/22   Love, Evlyn Kanner, PA-C  nutrition supplement, JUVEN, (JUVEN) PACK Take 1 packet by mouth 2 (two) times daily between meals. Purchase over the counter or on [employer] Patient not taking: Reported on 09/28/2022 09/05/22   Love, Evlyn Kanner, PA-C  nystatin (MYCOSTATIN) 100000 UNIT/ML suspension Take 5 mLs (500,000 Units total) by mouth 4 (four) times daily. Patient not taking: Reported on 02/27/2023 09/05/22   Love, Evlyn Kanner, PA-C  OZEMPIC, 0.25 OR 0.5 MG/DOSE, 2 MG/3ML SOPN Inject 0.75 mLs into the skin once a week.    [provider]  silver sulfADIAZINE (SILVADENE) 1 % cream Apply topically. Patient not taking: Reported on 02/27/2023 07/31/22   [provider]  TRULICITY 4.5 MG/0.5ML SOPN Inject into the skin. Patient not taking: Reported on 09/28/2022 07/10/22   [provider]  Zinc Sulfate 220 (50 Zn) MG TABS Take 1 tablet (220 mg total) by mouth daily with supper. Patient not taking: Reported on 02/27/2023 09/05/22   Jacquelynn Cree, PA-C   DG Foot Complete Left  Result Date: 02/26/2023 CLINICAL DATA:  Left foot wounds, sores, redness, leaking EXAM: LEFT FOOT - COMPLETE 3+ VIEW COMPARISON:  09/02/2022 FINDINGS: Transmetatarsal amputation of the left foot. Swelling about the distal stump and soft tissue irregularity about the stump of the first metatarsal. Questionable loss of cortical distinctness about the stumps of the first and second metatarsals. Vascular calcifications. IMPRESSION: Transmetatarsal amputation of the left foot with soft tissue swelling and irregularity about the stump of the first metatarsal. Questionable loss of cortical distinctness about the stumps of the first and second metatarsals. Osteomyelitis is not excluded. Consider MRI further evaluation. Electronically Signed   By: Minerva Fester M.D.   On: 02/26/2023 20:39   DG Chest Portable 1 View  Result Date: 02/26/2023 CLINICAL DATA:   Shortness of breath and pain EXAM: PORTABLE CHEST 1 VIEW COMPARISON:  08/25/2022 FINDINGS: Sternotomy and CABG. Stable cardiomediastinal silhouette. No focal consolidation, pleural effusion, or pneumothorax. No displaced rib fractures. IMPRESSION: No active disease. Electronically Signed   By: Minerva Fester M.D.   On: 02/26/2023 20:36    Positive ROS: All other systems have been reviewed and were otherwise negative with the exception of those mentioned in the HPI and as above.  12 point ROS was performed.  Physical Exam: General: Alert and oriented.  No  apparent distress.  Vascular:  Left foot:Dorsalis Pedis:  diminished Posterior Tibial:  diminished  Right foot: Dorsalis Pedis:  diminished Posterior Tibial:  diminished  Neuro:absent protective sensation  Derm: Right foot without ulceration.  Left foot with plantar medial arch ulceration that is stable and dry.  It measures at its widest about 1-1/2 cm x 3 cm.  There is a necrotic wound on the distal medial first metatarsal region at the transmetatarsal amputation site.  There was noted purulence to the area.  This wound probes down to bone.  Ortho/MS: Status post transmetatarsal amputation left foot  Assessment: Diabetic infection left foot Peripheral vascular disease Diabetes with neuropathy  Plan: Given the area of necrosis and purulent drainage I recommended I&D with debridement of this left foot.  He will need debridement of both the distal and plantar wounds.  I discussed this with him today in great detail.  Will try to perform within the next 24 to 48 hours.  Will need medicine for clearance.  A deep wound culture was performed by myself today and was sent for anaerobic and aerobic culture.  He gave verbal consent today for surgery.    Irean Hong, DPM Cell [phone]   02/27/2023 3:10 PM

## 2023-02-27 NOTE — Progress Notes (Signed)
Pharmacy Antibiotic Note  Andrew Harding is a 72 y.o. male admitted on 02/26/2023 with cellulitis.  Pharmacy has been consulted for Vancomycin dosing for 7 days.  Plan: Pt given Vancomycin 2000 mg once. Vancomycin 1500 mg IV Q 24 hrs. Goal AUC 400-550. Expected AUC: 517.8 SCr used: 1.76  Pharmacy will continue to follow and will adjust abx dosing whenever warranted.  Temp (24hrs), Avg:98.8 F (37.1 C), Min:98.6 F (37 C), Max:99.2 F (37.3 C)   Recent Labs  Lab 02/26/23 1630 02/26/23 1928  WBC 11.1*  --   CREATININE 1.76*  --   LATICACIDVEN 3.2* 1.7    Estimated Creatinine Clearance: 44.7 mL/min (A) (by C-G formula based on SCr of 1.76 mg/dL (H)).    No Known Allergies  Antimicrobials this admission: 10/28 Ceftriaxone >>  10/28 Vancomycin >> x 7 days  Microbiology results: 10/28 BCx: Pending  Thank you for allowing pharmacy to be a part of this patient's care.  Otelia Sergeant, PharmD, [facility] 02/27/2023 12:49 AM

## 2023-02-27 NOTE — Assessment & Plan Note (Signed)
creatinine 1.76 up from baseline of 1.13 about 6 months prior Likely prerenal, related to sepsis Expecting improvement with IV hydration Monitor renal function and avoid nephrotoxins

## 2023-02-27 NOTE — Assessment & Plan Note (Addendum)
PAD s/p left transmetatarsal amputation Continue clopidogrel, metoprolol, statin

## 2023-02-27 NOTE — Progress Notes (Signed)
*  PRELIMINARY RESULTS* Echocardiogram 2D Echocardiogram has been performed.  Andrew Harding 02/27/2023, 2:16 PM

## 2023-02-27 NOTE — Assessment & Plan Note (Addendum)
Sepsis PAD s/p left transmetatarsal amputation 07/2022 Sepsis criteria include tachycardia, tachypnea, leukocytosis and lactic acidosis with concerns for wound infection and possible osteomyelitis Continue Rocephin and vancomycin Sepsis fluids Podiatry consult

## 2023-02-27 NOTE — Assessment & Plan Note (Signed)
Basal insulin with sliding scale insulin coverage Gabapentin

## 2023-02-27 NOTE — Progress Notes (Signed)
PHARMACY - PHYSICIAN COMMUNICATION CRITICAL VALUE ALERT - BLOOD CULTURE IDENTIFICATION (BCID)  Andrew Harding is an 72 y.o. male who presented to [facility] on 02/26/2023 with a chief complaint of wound infection admitted due to sepsis and A fib RVR. They presented to ED after feeling fatigued at work and felt like they were going to pass out after walking up some stairs. S/p amputation of their toes - 6 months. Last week the patient noticed increased redness in their foot as well as increased fatigue/weakness.   Assessment:  Bcx from 10/28 3/4 GPC; BCID Staph aureus without resistance (MSSA). Potential source chronic L foot wound.   Name of physician (or Provider) Contacted: Dr. Elwyn Lade Agbata   Current antibiotics:  Ceftriaxone 2g Q24H Vancomycin 1500 mg Q24H   Changes to prescribed antibiotics recommended:  Stop current antibiotics Start Cefazolin 2g IV Q8H   Results for orders placed or performed during the hospital encounter of 02/26/23  Blood Culture ID Panel (Reflexed) (Collected: 02/26/2023  4:30 PM)  Result Value Ref Range   Enterococcus faecalis NOT DETECTED NOT DETECTED   Enterococcus Faecium NOT DETECTED NOT DETECTED   Listeria monocytogenes NOT DETECTED NOT DETECTED   Staphylococcus species DETECTED (A) NOT DETECTED   Staphylococcus aureus (BCID) DETECTED (A) NOT DETECTED   Staphylococcus epidermidis NOT DETECTED NOT DETECTED   Staphylococcus lugdunensis NOT DETECTED NOT DETECTED   Streptococcus species NOT DETECTED NOT DETECTED   Streptococcus agalactiae NOT DETECTED NOT DETECTED   Streptococcus pneumoniae NOT DETECTED NOT DETECTED   Streptococcus pyogenes NOT DETECTED NOT DETECTED   A.calcoaceticus-baumannii NOT DETECTED NOT DETECTED   Bacteroides fragilis NOT DETECTED NOT DETECTED   Enterobacterales NOT DETECTED NOT DETECTED   Enterobacter cloacae complex NOT DETECTED NOT DETECTED   Escherichia coli NOT DETECTED NOT DETECTED   Klebsiella aerogenes NOT DETECTED NOT  DETECTED   Klebsiella oxytoca NOT DETECTED NOT DETECTED   Klebsiella pneumoniae NOT DETECTED NOT DETECTED   Proteus species NOT DETECTED NOT DETECTED   Salmonella species NOT DETECTED NOT DETECTED   Serratia marcescens NOT DETECTED NOT DETECTED   Haemophilus influenzae NOT DETECTED NOT DETECTED   Neisseria meningitidis NOT DETECTED NOT DETECTED   Pseudomonas aeruginosa NOT DETECTED NOT DETECTED   Stenotrophomonas maltophilia NOT DETECTED NOT DETECTED   Candida albicans NOT DETECTED NOT DETECTED   Candida auris NOT DETECTED NOT DETECTED   Candida glabrata NOT DETECTED NOT DETECTED   Candida krusei NOT DETECTED NOT DETECTED   Candida parapsilosis NOT DETECTED NOT DETECTED   Candida tropicalis NOT DETECTED NOT DETECTED   Cryptococcus neoformans/gattii NOT DETECTED NOT DETECTED   Meth resistant mecA/C and MREJ NOT DETECTED NOT DETECTED    Effie Shy, PharmD Pharmacy Resident  02/27/2023 7:25 AM

## 2023-02-27 NOTE — Consult Note (Signed)
NAME: Andrew Harding  DOB: 01-11-1951  MRN: [id number]  Date/Time: 02/27/2023 3:23 PM  REQUESTING PROVIDER: Dr.Agbata Subjective:  REASON FOR CONSULT: MSSA bacteremia ? Andrew Harding is a 72 y.o. 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 He works at the [employer] and while at work he climbed [employer] of stairs and was very sob and was asked to seek medical help. So he came to the ED   02/26/23 16:16  BP 135/84  Temp 98.6 F (37 C)  Pulse Rate 136 !  Resp 22 !  SpO2 94 %  Weight 213 lb 13.5 oz    Latest Reference Range & Units 02/26/23  WBC 4.0 - 10.5 K/uL 11.1 (H)  Hemoglobin 13.0 - 17.0 g/dL 47.8 (L)  HCT 29.5 - 62.1 % 35.1 (L)  Platelets 150 - 400 K/uL 357  Creatinine 0.61 - 1.24 mg/dL 3.08 (H)   Blood culture sent , started on broad spectrum antibiotics Blood culture positive for MSSA and I am seeing the patient 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   Tubular adenoma    Vitamin D deficiency     Past Surgical History:  Procedure Laterality Date   AMPUTATION Left 08/19/2022   Procedure: TRANSMETATARSAL AMPUTATION LEFT FOOT WITH IRRIGATION AND DEBRIDEMENT;  Surgeon: Rosetta Posner, DPM;  Location: [facility] ORS;  Service: Podiatry;  Laterality: Left;   AMPUTATION TOE Right 06/01/2015   Procedure: AMPUTATION  TOE;  Surgeon: Recardo Evangelist, DPM;  Location: [facility];  Service: Podiatry;  Laterality: Right;   AMPUTATION TOE Left 08/11/2022   Procedure: AMPUTATION TOE 2, 3, 4;  Surgeon: [name], DPM;  Location: [facility];  Service: Podiatry;  Laterality: Left;   CATARACT EXTRACTION, BILATERAL     CIRCUMCISION N/A 06/12/2022   Procedure: CIRCUMCISION ADULT;  Surgeon: Vanna Scotland, MD;  Location: [facility];  Service: Urology;  Laterality: N/A;   COLONOSCOPY WITH PROPOFOL N/A 02/14/2016   Procedure: COLONOSCOPY WITH PROPOFOL;  Surgeon: Christena Deem, MD;  Location: [facility] ENDOSCOPY;  Service: Endoscopy;  Laterality: N/A;   COLONOSCOPY WITH PROPOFOL N/A 01/07/2019   Procedure: COLONOSCOPY WITH PROPOFOL;  Surgeon: Christena Deem, MD;  Location: [facility] ENDOSCOPY;  Service: Endoscopy;  Laterality: N/A;   CORONARY ARTERY BYPASS GRAFT N/A 08/2012   EXCISION PARTIAL PHALANX Right 06/01/2015   Procedure: EXCISION PARTIAL PHALANX /  BONE;  Surgeon: Recardo Evangelist, DPM;  Location: [facility];  Service: Podiatry;  Laterality: Right;   FLEXIBLE SIGMOIDOSCOPY N/A 05/29/2016   Procedure: FLEXIBLE SIGMOIDOSCOPY;  Surgeon: Christena Deem, MD;  Location: [facility] ENDOSCOPY;  Service: Endoscopy;  Laterality: N/A;   INCISION AND DRAINAGE Left 08/23/2022   Procedure: INCISION AND DRAINAGE;  Surgeon: [name], DPM;  Location: [facility];  Service: Podiatry;  Laterality: Left;   INCISION AND DRAINAGE OF WOUND Left 09/15/2022   Procedure: 11044 - DEBRIDE BONE and EXCISION IF 1ST METATARSAL BONE WITH  DELAY PRIMARY CLOSURE;  Surgeon: [name], DPM;  Location: [facility];  Service: Podiatry;  Laterality: Left;   KNEE ARTHROSCOPY Left    LOWER EXTREMITY ANGIOGRAPHY Left 08/22/2022   Procedure: Lower Extremity Angiography;  Surgeon: Renford Dills, MD;  Location: [facility] INVASIVE CV LAB;  Service: Cardiovascular;  Laterality: Left;   WOUND DEBRIDEMENT Left 09/15/2022   Procedure: 11043 - DEBRIDE SKIN. MUSCLE FASCIA;   Surgeon: [name], DPM;  Location: [facility];  Service: Podiatry;  Laterality: Left;    Social History   Socioeconomic History   Marital status: Divorced    Spouse name: Ashley,Angela C   Number of children: Not on file   Years of education: Not on file   Highest education level: Not on file  Occupational History   Not on file  Tobacco Use   Smoking status: Former    Types: Cigars   Smokeless tobacco: Never  Vaping Use   Vaping status: Never Used  Substance and Sexual Activity   Alcohol use: No   Drug use: No   Sexual activity: Never  Other Topics Concern   Not on file  Social History Narrative   Patient is legally separated and lives at home by himself. His ex -wife is his HCPOA. Neighbors and friends are his support system. He used to work for [employer]    Social Determinants of Health   Financial Resource Strain: Low Risk  (12/13/2022)   Received [facility]   Overall Financial Resource Strain (CARDIA)    Difficulty of Paying Living Expenses: Not hard at all  Food Insecurity: No Food Insecurity (12/13/2022)   Received from [facility]   Hunger Vital Sign    Worried About Running Out of Food in the Last Year: Never true    Ran Out of Food in the Last Year: Never true  Transportation Needs: No Transportation Needs (12/13/2022)   Received from [facility] - Transportation    In the past 12 months, has lack of transportation kept you from medical appointments or from getting medications?: No    Lack of Transportation (Non-Medical): No  Physical Activity: Not on file  Stress: Not on file  Social Connections: Not on file  Intimate Partner Violence: Not At Risk (08/19/2022)   Humiliation, Afraid, Rape, and Kick questionnaire    Fear of Current or Ex-Partner: No    Emotionally Abused: No    Physically Abused: No    Sexually Abused: No    Family History  Problem Relation Age of  Onset   Diabetes Mellitus II Mother    CAD Mother    No Known Allergies I? Current Facility-Administered Medications  Medication Dose Route Frequency Provider Last Rate Last Admin   acetaminophen (TYLENOL) tablet 650 mg  650 mg Oral Q6H PRN Andris Baumann, MD       Or   acetaminophen (TYLENOL) suppository 650 mg  650 mg Rectal Q6H PRN Andris Baumann, MD       ceFAZolin (ANCEF) IVPB 2g/100 mL premix  2 g Intravenous Q8H Lynn Ito, MD   Stopped at 02/27/23 1332   gabapentin (NEURONTIN) capsule 300 mg  300 mg Oral TID Andris Baumann, MD  300 mg at 02/27/23 1154   HYDROcodone-acetaminophen (NORCO/VICODIN) 5-325 MG per tablet 1-2 tablet  1-2 tablet Oral Q4H PRN Andris Baumann, MD       insulin aspart (novoLOG) injection 0-15 Units  0-15 Units Subcutaneous TID WC Agbata, Tochukwu, MD   3 Units at 02/27/23 1209   insulin glargine-yfgn (SEMGLEE) injection 20 Units  20 Units Subcutaneous QHS Andris Baumann, MD   20 Units at 02/27/23 0235   lactated ringers infusion  150 mL/hr Intravenous Continuous Lindajo Royal V, MD 150 mL/hr at 02/27/23 1149 150 mL/hr at 02/27/23 1149   metoprolol tartrate (LOPRESSOR) tablet 25 mg  25 mg Oral BID Andris Baumann, MD   25 mg at 02/27/23 1154   ondansetron (ZOFRAN) tablet 4 mg  4 mg Oral Q6H PRN Andris Baumann, MD       Or   ondansetron [facility]) injection 4 mg  4 mg Intravenous Q6H PRN Andris Baumann, MD       pravastatin (PRAVACHOL) tablet 80 mg  80 mg Oral q1800 Andris Baumann, MD       traZODone (DESYREL) tablet 50 mg  50 mg Oral QHS PRN Andris Baumann, MD       Current Outpatient Medications  Medication Sig Dispense Refill   apixaban (ELIQUIS) 5 MG TABS tablet Take 1 tablet (5 mg total) by mouth 2 (two) times daily. 60 tablet 5   clopidogrel (PLAVIX) 75 MG tablet Take 1 tablet (75 mg total) by mouth daily with breakfast. 30 tablet 5   docusate sodium (COLACE) 100 MG capsule Take 1 capsule (100 mg total) by mouth 2 (two) times daily. 100  capsule 0   gabapentin (NEURONTIN) 300 MG capsule Take 1 capsule (300 mg total) by mouth 3 (three) times daily. 90 capsule 0   metFORMIN (GLUCOPHAGE-XR) 500 MG 24 hr tablet Take 2,000 mg by mouth every evening. Take with dinner.     metoprolol tartrate (LOPRESSOR) 25 MG tablet Take 1 tablet (25 mg total) by mouth 2 (two) times daily. 60 tablet 0   pravastatin (PRAVACHOL) 80 MG tablet TAKE ONE TABLET BY MOUTH AT NIGHT     traZODone (DESYREL) 50 MG tablet TAKE ONE TABLET AT BEDTIME AS NEEDED     acetaminophen (TYLENOL) 325 MG tablet Take 1-2 tablets (325-650 mg total) by mouth every 4 (four) hours as needed for mild pain.     amoxicillin-clavulanate (AUGMENTIN) 875-125 MG tablet Take 1 tablet by mouth 2 (two) times daily. (Patient not taking: Reported on 09/28/2022) 28 tablet 0   ascorbic acid (VITAMIN C) 500 MG tablet Take 1 tablet (500 mg total) by mouth 2 (two) times daily. (Patient not taking: Reported on 02/27/2023) 100 tablet 0   cholecalciferol (VITAMIN D) 1000 units tablet Take 2,000 Units by mouth daily. (Patient not taking: Reported on 09/28/2022)     ciprofloxacin (CIPRO) 500 MG tablet Take 1 tablet (500 mg total) by mouth 2 (two) times daily. (Patient not taking: Reported on 02/27/2023) 28 tablet 0   Continuous Glucose Sensor (FREESTYLE LIBRE 2 SENSOR) MISC      glucose blood (ONETOUCH ULTRA) test strip Use 3 (three) times daily     insulin glargine (LANTUS) 100 UNIT/ML Solostar Pen Inject 36 Units into the skin at bedtime. (Patient taking differently: Inject 26 Units into the skin at bedtime.) 15 mL 11   Insulin Pen Needle (FIFTY50 PEN NEEDLES) 32G X 4 MM MISC USE 4 TIMES DAILY     NOVOLOG FLEXPEN  100 UNIT/ML FlexPen Use the sliding scale insulin (Patient not taking: Reported on 09/14/2022) 15 mL 11   nutrition supplement, JUVEN, (JUVEN) PACK Take 1 packet by mouth 2 (two) times daily between meals. Purchase over the counter or on [employer] (Patient not taking: Reported on 09/28/2022)  0    nystatin (MYCOSTATIN) 100000 UNIT/ML suspension Take 5 mLs (500,000 Units total) by mouth 4 (four) times daily. (Patient not taking: Reported on 02/27/2023) 60 mL 0   OZEMPIC, 0.25 OR 0.5 MG/DOSE, 2 MG/3ML SOPN Inject 0.75 mLs into the skin once a week.     silver sulfADIAZINE (SILVADENE) 1 % cream Apply topically. (Patient not taking: Reported on 02/27/2023)     TRULICITY 4.5 MG/0.5ML SOPN Inject into the skin. (Patient not taking: Reported on 09/28/2022)     Zinc Sulfate 220 (50 Zn) MG TABS Take 1 tablet (220 mg total) by mouth daily with supper. (Patient not taking: Reported on 02/27/2023) 100 tablet 0     Abtx:  Anti-infectives (From admission, onward)    Start     Dose/Rate Route Frequency Ordered Stop   02/27/23 1600  cefTRIAXone (ROCEPHIN) 2 g in sodium chloride 0.9 % 100 mL IVPB  Status:  Discontinued        2 g 200 mL/hr over 30 Minutes Intravenous Every 24 hours 02/27/23 0021 02/27/23 0840   02/27/23 1000  vancomycin (VANCOREADY) IVPB 1500 mg/300 mL  Status:  Discontinued        1,500 mg 150 mL/hr over 120 Minutes Intravenous Every 24 hours 02/27/23 0049 02/27/23 0840   02/27/23 0930  ceFAZolin (ANCEF) IVPB 2g/100 mL premix        2 g 200 mL/hr over 30 Minutes Intravenous Every 8 hours 02/27/23 0841     02/26/23 1745  vancomycin (VANCOCIN) IVPB 1000 mg/200 mL premix       Placed in "Followed by" Linked Group   1,000 mg 200 mL/hr over 60 Minutes Intravenous  Once 02/26/23 1638 02/26/23 1927   02/26/23 1645  vancomycin (VANCOCIN) IVPB 1000 mg/200 mL premix  Status:  Discontinued        1,000 mg 200 mL/hr over 60 Minutes Intravenous  Once 02/26/23 1633 02/26/23 1636   02/26/23 1645  cefTRIAXone (ROCEPHIN) 2 g in sodium chloride 0.9 % 100 mL IVPB        2 g 200 mL/hr over 30 Minutes Intravenous  Once 02/26/23 1633 02/26/23 1721   02/26/23 1645  vancomycin (VANCOCIN) IVPB 1000 mg/200 mL premix       Placed in "Followed by" Linked Group   1,000 mg 200 mL/hr over 60 Minutes  Intravenous  Once 02/26/23 1638 02/26/23 1927       REVIEW OF SYSTEMS:  Const: negative fever, negative chills, negative weight loss Eyes: negative diplopia or visual changes, negative eye pain ENT: negative coryza, negative sore throat Resp: negative cough, hemoptysis, has dyspnea Cards: negative for chest pain, palpitations, lower extremity edema GU: negative for frequency, dysuria and hematuria GI: Negative for abdominal pain, diarrhea, bleeding, constipation Skin: negative for rash and pruritus Heme: negative for easy bruising and gum/nose bleeding MS:  weakness Neurolo:negative for headaches, dizziness, vertigo, memory problems  Psych: negative for feelings of anxiety, depression  Endocrine:  diabetes Allergy/Immunology- negative for any medication or food allergies ? Pertinent Positives include : Objective:  VITALS:  BP 122/85   Pulse 79   Temp 99.8 F (37.7 C) (Oral)   Resp 18   Ht 6\' 3"  (1.905 m)  Wt 97 kg   SpO2 97%   BMI 26.73 kg/m   PHYSICAL EXAM:  General: Alert, cooperative, no distress, appears stated age.  Head: Normocephalic, without obvious abnormality, atraumatic. Eyes: Conjunctivae clear, anicteric sclerae. Pupils are equal ENT Nares normal. No drainage or sinus tenderness. Lips, mucosa, and tongue normal. No Thrush Neck: Supple, symmetrical, no adenopathy, thyroid: non tender no carotid bruit and no JVD. Back: No CVA tenderness. Lungs: Clear to auscultation bilaterally. No Wheezing or Rhonchi. No rales. Heart: irregular Abdomen: Soft, non-tender,not distended. Bowel sounds normal. No masses Extremities: left foot- TMA Ulcer at the plantar surface     Skin: No rashes or lesions. Or bruising Lymph: Cervical, supraclavicular normal. Neurologic: Grossly non-focal Pertinent Labs Lab Results CBC    Component Value Date/Time   WBC 9.2 02/27/2023 0421   RBC 3.32 (L) 02/27/2023 0421   HGB 9.9 (L) 02/27/2023 0421   HCT 29.5 (L) 02/27/2023  0421   PLT 265 02/27/2023 0421   MCV 88.9 02/27/2023 0421   MCH 29.8 02/27/2023 0421   MCHC 33.6 02/27/2023 0421   RDW 12.5 02/27/2023 0421   LYMPHSABS 0.5 (L) 02/26/2023 1630   MONOABS 0.4 02/26/2023 1630   EOSABS 0.1 02/26/2023 1630   BASOSABS 0.0 02/26/2023 1630       Latest Ref Rng & Units 02/27/2023    4:21 AM 02/26/2023    4:30 PM 09/05/2022    4:15 AM  CMP  Glucose 70 - 99 mg/dL 409  811  914   BUN 8 - 23 mg/dL 18  23  35   Creatinine 0.61 - 1.24 mg/dL 7.82  9.56  2.13   Sodium 135 - 145 mmol/L 131  132  135   Potassium 3.5 - 5.1 mmol/L 3.8  4.3  3.9   Chloride 98 - 111 mmol/L 101  97  104   CO2 22 - 32 mmol/L 23  23  24    Calcium 8.9 - 10.3 mg/dL 8.2  8.9  8.9   Total Protein 6.5 - 8.1 g/dL  8.4    Total Bilirubin 0.3 - 1.2 mg/dL  0.8    Alkaline Phos 38 - 126 U/L  64    AST 15 - 41 U/L  27    ALT 0 - 44 U/L  28        Microbiology: Recent Results (from the past 240 hour(s))  Culture, blood (Routine x 2)     Status: None (Preliminary result)   Collection Time: 02/26/23  4:30 PM   Specimen: BLOOD  Result Value Ref Range Status   Specimen Description BLOOD  R ARM  Final   Special Requests   Final    BOTTLES DRAWN AEROBIC AND ANAEROBIC Blood Culture results may not be optimal due to an inadequate volume of blood received in culture bottles   Culture  Setup Time   Final    GRAM POSITIVE COCCI IN BOTH AEROBIC AND ANAEROBIC BOTTLES Organism ID to follow CRITICAL RESULT CALLED TO, READ BACK BY AND VERIFIED WITH: Dawayne Cirri 02/27/23 0636 MW Performed at [facility], [address] [place], [place] [address]    Culture [facility] POSITIVE COCCI  Final   Report Status PENDING  Incomplete  Blood Culture ID Panel (Reflexed)     Status: Abnormal   Collection Time: 02/26/23  4:30 PM  Result Value Ref Range Status   Enterococcus faecalis NOT DETECTED NOT DETECTED Final   Enterococcus Faecium NOT DETECTED NOT DETECTED Final   Listeria monocytogenes  NOT DETECTED  NOT DETECTED Final   Staphylococcus species DETECTED (A) NOT DETECTED Final    Comment: CRITICAL RESULT CALLED TO, READ BACK BY AND VERIFIED WITH: NATHAN BELUE 02/27/23 0636 MW    Staphylococcus aureus (BCID) DETECTED (A) NOT DETECTED Final    Comment: CRITICAL RESULT CALLED TO, READ BACK BY AND VERIFIED WITH: NATHAN BELUE 02/27/23 0636 MW    Staphylococcus epidermidis NOT DETECTED NOT DETECTED Final   Staphylococcus lugdunensis NOT DETECTED NOT DETECTED Final   Streptococcus species NOT DETECTED NOT DETECTED Final   Streptococcus agalactiae NOT DETECTED NOT DETECTED Final   Streptococcus pneumoniae NOT DETECTED NOT DETECTED Final   Streptococcus pyogenes NOT DETECTED NOT DETECTED Final   A.calcoaceticus-baumannii NOT DETECTED NOT DETECTED Final   Bacteroides fragilis NOT DETECTED NOT DETECTED Final   Enterobacterales NOT DETECTED NOT DETECTED Final   Enterobacter cloacae complex NOT DETECTED NOT DETECTED Final   Escherichia coli NOT DETECTED NOT DETECTED Final   Klebsiella aerogenes NOT DETECTED NOT DETECTED Final   Klebsiella oxytoca NOT DETECTED NOT DETECTED Final   Klebsiella pneumoniae NOT DETECTED NOT DETECTED Final   Proteus species NOT DETECTED NOT DETECTED Final   Salmonella species NOT DETECTED NOT DETECTED Final   Serratia marcescens NOT DETECTED NOT DETECTED Final   Haemophilus influenzae NOT DETECTED NOT DETECTED Final   Neisseria meningitidis NOT DETECTED NOT DETECTED Final   Pseudomonas aeruginosa NOT DETECTED NOT DETECTED Final   Stenotrophomonas maltophilia NOT DETECTED NOT DETECTED Final   Candida albicans NOT DETECTED NOT DETECTED Final   Candida auris NOT DETECTED NOT DETECTED Final   Candida glabrata NOT DETECTED NOT DETECTED Final   Candida krusei NOT DETECTED NOT DETECTED Final   Candida parapsilosis NOT DETECTED NOT DETECTED Final   Candida tropicalis NOT DETECTED NOT DETECTED Final   Cryptococcus neoformans/gattii NOT DETECTED NOT DETECTED Final   Meth  resistant mecA/C and MREJ NOT DETECTED NOT DETECTED Final    Comment: Performed at [facility], [address] [place], [place] [address]  Culture, blood (Routine x 2)     Status: None (Preliminary result)   Collection Time: 02/26/23  4:31 PM   Specimen: BLOOD  Result Value Ref Range Status   Specimen Description BLOOD  L ARM  Final   Special Requests   Final    BOTTLES DRAWN AEROBIC AND ANAEROBIC Blood Culture adequate volume   Culture  Setup Time   Final    GRAM POSITIVE COCCI IN BOTH AEROBIC AND ANAEROBIC BOTTLES CRITICAL RESULT CALLED TO, READ BACK BY AND VERIFIED WITH: NATHAN BELUE 02/27/23 0636 MW GRAM STAIN REVIEWED-AGREE WITH RESULT Performed at [facility], [address] [place], [place] [address]    Culture GRAM POSITIVE COCCI  Final   Report Status PENDING  Incomplete    IMAGING RESULTS:  I have personally reviewed the films ?left foot TMA- questionable loss of cortical bone at the 1st and 2nd met end concerning for osteo  CXR no infiltrate   Impression/Recommendation ? MSSA bacteremia- likely source left foot ulcers Will need 2 d echo and TEE Agree with cefazolin Will repeat blood culture  DM with peripheral neuropathy and diabetic foot infection  On multiple meds  Left TMA Ulcer on the plantar surface Osteomyelitis a concern Dr.Fowler took a culture  CAD s/p CABG   Afib on eliquis and metoprolol  AKI  Previous infection left foot after thermal injury leading to TMA and enterobacter cloacae and enterococcus and he got 4 weeks of iv meropenem followed  by 2 weeks of Po cipro and Augmentin early this year  Discussed the management with the patient _______________________________ Discussed with patient, requesting provider Note:  This document was prepared using Dragon voice recognition software and may include unintentional dictation errors.

## 2023-02-27 NOTE — Assessment & Plan Note (Signed)
Likely driven by sepsis Expecting improvement with IV fluids for treatment of sepsis and continuation of home metoprolol Continuous cardiac monitoring Will hold Eliquis in anticipation of possible wound debridement

## 2023-02-28 ENCOUNTER — Other Ambulatory Visit: Payer: Self-pay

## 2023-02-28 ENCOUNTER — Inpatient Hospital Stay: Payer: Managed Care, Other (non HMO) | Admitting: Anesthesiology

## 2023-02-28 ENCOUNTER — Encounter: Payer: Self-pay | Admitting: Internal Medicine

## 2023-02-28 ENCOUNTER — Encounter
Admission: EM | Disposition: A | Discharge status: Home Health | Payer: Self-pay | Source: Home / Self Care | Attending: Internal Medicine

## 2023-02-28 DIAGNOSIS — R7881 Bacteremia: Secondary | ICD-10-CM | POA: Diagnosis not present

## 2023-02-28 DIAGNOSIS — Z89432 Acquired absence of left foot: Secondary | ICD-10-CM

## 2023-02-28 DIAGNOSIS — B9561 Methicillin susceptible Staphylococcus aureus infection as the cause of diseases classified elsewhere: Secondary | ICD-10-CM

## 2023-02-28 DIAGNOSIS — R55 Syncope and collapse: Secondary | ICD-10-CM | POA: Diagnosis not present

## 2023-02-28 DIAGNOSIS — E1169 Type 2 diabetes mellitus with other specified complication: Secondary | ICD-10-CM | POA: Diagnosis not present

## 2023-02-28 DIAGNOSIS — R42 Dizziness and giddiness: Secondary | ICD-10-CM | POA: Diagnosis not present

## 2023-02-28 DIAGNOSIS — M86272 Subacute osteomyelitis, left ankle and foot: Secondary | ICD-10-CM | POA: Diagnosis not present

## 2023-02-28 HISTORY — DX: Methicillin susceptible Staphylococcus aureus infection as the cause of diseases classified elsewhere: B95.61

## 2023-02-28 HISTORY — PX: IRRIGATION AND DEBRIDEMENT FOOT: SHX6602

## 2023-02-28 LAB — GLUCOSE, CAPILLARY
Glucose-Capillary: 153 mg/dL — ABNORMAL HIGH (ref 70–99)
Glucose-Capillary: 129 mg/dL — ABNORMAL HIGH (ref 70–99)
Glucose-Capillary: 107 mg/dL — ABNORMAL HIGH (ref 70–99)
Glucose-Capillary: 108 mg/dL — ABNORMAL HIGH (ref 70–99)
Glucose-Capillary: 289 mg/dL — ABNORMAL HIGH (ref 70–99)

## 2023-02-28 SURGERY — IRRIGATION AND DEBRIDEMENT FOOT
Anesthesia: General | Site: Foot | Laterality: Left

## 2023-02-28 MED ORDER — PROPOFOL 500 MG/50ML IV EMUL
INTRAVENOUS | Status: DC | PRN
  Administered 2023-02-28: 150 ug/kg/min via INTRAVENOUS

## 2023-02-28 MED ORDER — POVIDONE-IODINE 10 % EX SWAB: 2.0000 | Freq: Once | CUTANEOUS | Status: DC

## 2023-02-28 MED ORDER — SODIUM CHLORIDE 0.9 % IV SOLN: INTRAVENOUS | Status: DC

## 2023-02-28 MED ORDER — ORAL CARE MOUTH RINSE: 15.0000 mL | Freq: Once | OROMUCOSAL | Status: AC

## 2023-02-28 MED ORDER — CEFAZOLIN SODIUM-DEXTROSE 2-4 GM/100ML-% IV SOLN: 2.0000 g | INTRAVENOUS | Status: DC

## 2023-02-28 MED ORDER — BUPIVACAINE HCL (PF) 0.5 % IJ SOLN
INTRAMUSCULAR | Status: AC
  Filled 2023-02-28: qty 30

## 2023-02-28 MED ORDER — CHLORHEXIDINE GLUCONATE 4 % EX SOLN: 60.0000 mL | Freq: Once | CUTANEOUS | Status: DC

## 2023-02-28 MED ORDER — CHLORHEXIDINE GLUCONATE 0.12 % MT SOLN
OROMUCOSAL | Status: AC
  Filled 2023-02-28: qty 15

## 2023-02-28 MED ORDER — LIDOCAINE HCL (PF) 1 % IJ SOLN
INTRAMUSCULAR | Status: AC
  Filled 2023-02-28: qty 30

## 2023-02-28 MED ORDER — 0.9 % SODIUM CHLORIDE (POUR BTL) OPTIME
TOPICAL | Status: DC | PRN
  Administered 2023-02-28: 500 mL

## 2023-02-28 MED ORDER — BUPIVACAINE HCL (PF) 0.25 % IJ SOLN
INTRAMUSCULAR | Status: AC
  Filled 2023-02-28: qty 30

## 2023-02-28 MED ORDER — LIDOCAINE-EPINEPHRINE (PF) 1 %-1:200000 IJ SOLN
INTRAMUSCULAR | Status: AC
  Filled 2023-02-28: qty 30

## 2023-02-28 MED ORDER — SODIUM CHLORIDE 0.9 % IR SOLN
Status: DC | PRN
Start: 2023-02-28 — End: 2023-02-28
  Administered 2023-02-28: 250 mL

## 2023-02-28 MED ORDER — PROPOFOL 10 MG/ML IV BOLUS
INTRAVENOUS | Status: DC | PRN
  Administered 2023-02-28: 100 mg via INTRAVENOUS
  Administered 2023-02-28: 40 mg via INTRAVENOUS

## 2023-02-28 MED ORDER — FENTANYL CITRATE (PF) 100 MCG/2ML IJ SOLN
25.0000 ug | INTRAMUSCULAR | Status: DC | PRN
Start: 2023-02-28 — End: 2023-02-28

## 2023-02-28 MED ORDER — ORAL CARE MOUTH RINSE: 15.0000 mL | OROMUCOSAL | Status: DC | PRN

## 2023-02-28 MED ORDER — LIDOCAINE-EPINEPHRINE (PF) 1 %-1:200000 IJ SOLN
INTRAMUSCULAR | Status: DC | PRN
  Administered 2023-02-28: 10 mL

## 2023-02-28 MED ORDER — CHLORHEXIDINE GLUCONATE 0.12 % MT SOLN
15.0000 mL | Freq: Once | OROMUCOSAL | Status: AC
Start: 2023-02-28 — End: 2023-02-28
  Administered 2023-02-28: 15 mL via OROMUCOSAL

## 2023-02-28 SURGICAL SUPPLY — 73 items
AGENT HMST PWDR HDRLZ BVN CLGN (Miscellaneous) ×1 IMPLANT
BLADE MED AGGRESSIVE (BLADE) IMPLANT
BLADE OSC/SAGITTAL MD 5.5X18 (BLADE) IMPLANT
BLADE OSCILLATING/SAGITTAL (BLADE)
BLADE SURG 15 STRL LF DISP TIS (BLADE) ×1 IMPLANT
BLADE SURG 15 STRL SS (BLADE) ×1
BLADE SW THK.38XMED LNG THN (BLADE) IMPLANT
BNDG CMPR 5X4 CHSV STRCH STRL (GAUZE/BANDAGES/DRESSINGS) ×1
BNDG CMPR 5X4 KNIT ELC UNQ LF (GAUZE/BANDAGES/DRESSINGS) ×1
BNDG CMPR 5X6 CHSV STRCH STRL (GAUZE/BANDAGES/DRESSINGS) ×1
BNDG CMPR 75X21 PLY HI ABS (MISCELLANEOUS) ×1
BNDG COHESIVE 4X5 TAN STRL LF (GAUZE/BANDAGES/DRESSINGS) ×1 IMPLANT
BNDG COHESIVE 6X5 TAN ST LF (GAUZE/BANDAGES/DRESSINGS) ×1 IMPLANT
BNDG ELASTIC 4INX 5YD STR LF (GAUZE/BANDAGES/DRESSINGS) ×1 IMPLANT
BNDG ESMARCH 4 X 12 STRL LF (GAUZE/BANDAGES/DRESSINGS) ×1
BNDG ESMARCH 4X12 STRL LF (GAUZE/BANDAGES/DRESSINGS) ×1 IMPLANT
BNDG GAUZE DERMACEA FLUFF 4 (GAUZE/BANDAGES/DRESSINGS) ×1 IMPLANT
BNDG GZE 12X3 1 PLY HI ABS (GAUZE/BANDAGES/DRESSINGS) ×1
BNDG GZE DERMACEA 4 6PLY (GAUZE/BANDAGES/DRESSINGS) ×1
BNDG STRETCH GAUZE 3IN X12FT (GAUZE/BANDAGES/DRESSINGS) ×1 IMPLANT
CANISTER WOUND CARE 500ML ATS (WOUND CARE) ×1 IMPLANT
COLLAGEN CELLERATERX 1 GRAM (Miscellaneous) IMPLANT
CUFF TOURN SGL QUICK 12 (TOURNIQUET CUFF) IMPLANT
CUFF TOURN SGL QUICK 18X4 (TOURNIQUET CUFF) IMPLANT
DRAPE FLUOR MINI C-ARM 54X84 (DRAPES) IMPLANT
DRAPE XRAY CASSETTE 23X24 (DRAPES) IMPLANT
DRSG MEPILEX FLEX 3X3 (GAUZE/BANDAGES/DRESSINGS) IMPLANT
DURAPREP 26ML APPLICATOR (WOUND CARE) ×1 IMPLANT
ELECT REM PT RETURN 9FT ADLT (ELECTROSURGICAL) ×1
ELECTRODE REM PT RTRN 9FT ADLT (ELECTROSURGICAL) ×1 IMPLANT
GAUZE PACKING 0.25INX5YD STRL (GAUZE/BANDAGES/DRESSINGS) ×1 IMPLANT
GAUZE SPONGE 4X4 12PLY STRL (GAUZE/BANDAGES/DRESSINGS) ×1 IMPLANT
GAUZE STRETCH 2X75IN STRL (MISCELLANEOUS) ×1 IMPLANT
GAUZE XEROFORM 1X8 LF (GAUZE/BANDAGES/DRESSINGS) ×1 IMPLANT
GLOVE BIO SURGEON STRL SZ7.5 (GLOVE) ×1 IMPLANT
GLOVE INDICATOR 8.0 STRL GRN (GLOVE) ×1 IMPLANT
GOWN STRL REUS W/ TWL LRG LVL3 (GOWN DISPOSABLE) ×1 IMPLANT
GOWN STRL REUS W/TWL LRG LVL3 (GOWN DISPOSABLE) ×1
GOWN STRL REUS W/TWL MED LVL3 (GOWN DISPOSABLE) ×1 IMPLANT
GOWN STRL REUS W/TWL XL LVL4 (GOWN DISPOSABLE) ×1 IMPLANT
HANDPIECE VERSAJET DEBRIDEMENT (MISCELLANEOUS) IMPLANT
IV NS 1000ML (IV SOLUTION) ×1
IV NS 1000ML BAXH (IV SOLUTION) ×1 IMPLANT
IV NS IRRIG 3000ML ARTHROMATIC (IV SOLUTION) ×1 IMPLANT
KIT DRSG VAC SLVR GRANUFM (MISCELLANEOUS) ×1 IMPLANT
KIT TURNOVER KIT A (KITS) ×1 IMPLANT
LABEL OR SOLS (LABEL) ×1 IMPLANT
MANIFOLD NEPTUNE II (INSTRUMENTS) ×1 IMPLANT
NDL FILTER BLUNT 18X1 1/2 (NEEDLE) ×1 IMPLANT
NDL HYPO 25X1 1.5 SAFETY (NEEDLE) ×1 IMPLANT
NEEDLE FILTER BLUNT 18X1 1/2 (NEEDLE) ×1 IMPLANT
NEEDLE HYPO 25X1 1.5 SAFETY (NEEDLE) ×1 IMPLANT
NS IRRIG 500ML POUR BTL (IV SOLUTION) ×1 IMPLANT
PACK EXTREMITY ARMC (MISCELLANEOUS) ×1 IMPLANT
PACKING GAUZE IODOFORM 1INX5YD (GAUZE/BANDAGES/DRESSINGS) ×1 IMPLANT
PAD ABD DERMACEA PRESS 5X9 (GAUZE/BANDAGES/DRESSINGS) ×1 IMPLANT
PULSAVAC PLUS IRRIG FAN TIP (DISPOSABLE)
RASP SM TEAR CROSS CUT (RASP) IMPLANT
SHIELD FULL FACE ANTIFOG 7M (MISCELLANEOUS) ×1 IMPLANT
STOCKINETTE IMPERVIOUS 9X36 MD (GAUZE/BANDAGES/DRESSINGS) ×1 IMPLANT
SUT ETHILON 2 0 FS 18 (SUTURE) ×2 IMPLANT
SUT ETHILON 4-0 (SUTURE)
SUT ETHILON 4-0 FS2 18XMFL BLK (SUTURE)
SUT VIC AB 3-0 SH 27 (SUTURE) ×1
SUT VIC AB 3-0 SH 27X BRD (SUTURE) ×1 IMPLANT
SUT VIC AB 4-0 FS2 27 (SUTURE) ×1 IMPLANT
SUTURE ETHLN 4-0 FS2 18XMF BLK (SUTURE) ×1 IMPLANT
SWAB CULTURE AMIES ANAERIB BLU (MISCELLANEOUS) IMPLANT
SYR 10ML LL (SYRINGE) ×1 IMPLANT
SYR 3ML LL SCALE MARK (SYRINGE) ×1 IMPLANT
TIP FAN IRRIG PULSAVAC PLUS (DISPOSABLE) ×1 IMPLANT
TRAP FLUID SMOKE EVACUATOR (MISCELLANEOUS) ×1 IMPLANT
WATER STERILE IRR 500ML POUR (IV SOLUTION) ×1 IMPLANT

## 2023-02-28 NOTE — Consult Note (Signed)
Physically Abused: No    Sexually Abused: No    Family History  Problem Relation Age of Onset   Diabetes Mellitus II Mother    CAD Mother      Vitals:   02/27/23 2254 02/28/23 0401 02/28/23 0741 02/28/23 1121  BP: 122/66 (!) 100/51 128/76 120/79  Pulse: 81 83 76 81  Resp: 18 18 18 20   Temp: 99 F (37.2 C) 98.6 F (37 C) 98.5 F (36.9 C) 97.9 F (36.6 C)  TempSrc: Oral  Oral Oral  SpO2: 97% 97% 96% 99%  Weight:      Height:        PHYSICAL EXAM General: Well appearing, well nourished, in no acute distress. HEENT: Normocephalic and atraumatic. Neck: No JVD.  Lungs: Normal respiratory effort on room air. Clear bilaterally to auscultation. No wheezes, crackles, rhonchi.  Heart: Irregularly irregular. Normal S1 and S2 without gallops or murmurs.  Abdomen: Non-distended appearing.  Msk: Normal strength and tone for age. Extremities: Warm and well perfused. No clubbing, cyanosis. No edema.  Neuro: Alert and oriented X 3. Psych: Answers questions appropriately.   Labs: Basic Metabolic Panel: Recent Labs    02/26/23 1630 02/27/23 0421  NA 132* 131*  K 4.3 3.8  CL 97* 101  CO2 23 23  GLUCOSE 255* 177*  BUN 23 18  CREATININE 1.76* 1.39*  CALCIUM 8.9 8.2*   Liver Function Tests: Recent Labs    02/26/23 1630  AST 27  ALT 28  ALKPHOS 64  BILITOT 0.8  PROT 8.4*  ALBUMIN 3.7   No results for input(s): "LIPASE", "AMYLASE" in the last 72 hours. CBC: Recent Labs    02/26/23 1630 02/27/23 0421  WBC 11.1* 9.2  NEUTROABS 10.1*  --   HGB 11.4* 9.9*  HCT 35.1* 29.5*  MCV 89.8 88.9  PLT 357 265   Cardiac Enzymes: No results for input(s): "CKTOTAL", "CKMB", "CKMBINDEX", "TROPONINIHS" in the last 72 hours. BNP: No results for input(s): "BNP" in the  last 72 hours. D-Dimer: No results for input(s): "DDIMER" in the last 72 hours. Hemoglobin A1C: Recent Labs    02/26/23 1630  HGBA1C 7.5*   Fasting Lipid Panel: No results for input(s): "CHOL", "HDL", "LDLCALC", "TRIG", "CHOLHDL", "LDLDIRECT" in the last 72 hours. Thyroid Function Tests: No results for input(s): "TSH", "T4TOTAL", "T3FREE", "THYROIDAB" in the last 72 hours.  Invalid input(s): "FREET3" Anemia Panel: No results for input(s): "VITAMINB12", "FOLATE", "FERRITIN", "TIBC", "IRON", "RETICCTPCT" in the last 72 hours.   Radiology: ECHOCARDIOGRAM COMPLETE  Result Date: 02/27/2023    ECHOCARDIOGRAM REPORT   Patient Name:   Andrew Harding Date of Exam: 02/27/2023 Medical Rec #:  [id number]   Height:       75.0 in Accession #:    [id number]  Weight:       213.8 lb Date of Birth:  [birth year]    BSA:          2.258 m Patient Age:    72 years    BP:           122/85 mmHg Patient Gender: M           HR:           86 bpm. Exam Location:  [facility] Procedure: 2D Echo, Cardiac Doppler and Color Doppler Indications:     Bacteremia  History:         Patient has prior history of Echocardiogram examinations, most  Physically Abused: No    Sexually Abused: No    Family History  Problem Relation Age of Onset   Diabetes Mellitus II Mother    CAD Mother      Vitals:   02/27/23 2254 02/28/23 0401 02/28/23 0741 02/28/23 1121  BP: 122/66 (!) 100/51 128/76 120/79  Pulse: 81 83 76 81  Resp: 18 18 18 20   Temp: 99 F (37.2 C) 98.6 F (37 C) 98.5 F (36.9 C) 97.9 F (36.6 C)  TempSrc: Oral  Oral Oral  SpO2: 97% 97% 96% 99%  Weight:      Height:        PHYSICAL EXAM General: Well appearing, well nourished, in no acute distress. HEENT: Normocephalic and atraumatic. Neck: No JVD.  Lungs: Normal respiratory effort on room air. Clear bilaterally to auscultation. No wheezes, crackles, rhonchi.  Heart: Irregularly irregular. Normal S1 and S2 without gallops or murmurs.  Abdomen: Non-distended appearing.  Msk: Normal strength and tone for age. Extremities: Warm and well perfused. No clubbing, cyanosis. No edema.  Neuro: Alert and oriented X 3. Psych: Answers questions appropriately.   Labs: Basic Metabolic Panel: Recent Labs    02/26/23 1630 02/27/23 0421  NA 132* 131*  K 4.3 3.8  CL 97* 101  CO2 23 23  GLUCOSE 255* 177*  BUN 23 18  CREATININE 1.76* 1.39*  CALCIUM 8.9 8.2*   Liver Function Tests: Recent Labs    02/26/23 1630  AST 27  ALT 28  ALKPHOS 64  BILITOT 0.8  PROT 8.4*  ALBUMIN 3.7   No results for input(s): "LIPASE", "AMYLASE" in the last 72 hours. CBC: Recent Labs    02/26/23 1630 02/27/23 0421  WBC 11.1* 9.2  NEUTROABS 10.1*  --   HGB 11.4* 9.9*  HCT 35.1* 29.5*  MCV 89.8 88.9  PLT 357 265   Cardiac Enzymes: No results for input(s): "CKTOTAL", "CKMB", "CKMBINDEX", "TROPONINIHS" in the last 72 hours. BNP: No results for input(s): "BNP" in the  last 72 hours. D-Dimer: No results for input(s): "DDIMER" in the last 72 hours. Hemoglobin A1C: Recent Labs    02/26/23 1630  HGBA1C 7.5*   Fasting Lipid Panel: No results for input(s): "CHOL", "HDL", "LDLCALC", "TRIG", "CHOLHDL", "LDLDIRECT" in the last 72 hours. Thyroid Function Tests: No results for input(s): "TSH", "T4TOTAL", "T3FREE", "THYROIDAB" in the last 72 hours.  Invalid input(s): "FREET3" Anemia Panel: No results for input(s): "VITAMINB12", "FOLATE", "FERRITIN", "TIBC", "IRON", "RETICCTPCT" in the last 72 hours.   Radiology: ECHOCARDIOGRAM COMPLETE  Result Date: 02/27/2023    ECHOCARDIOGRAM REPORT   Patient Name:   Andrew Harding Date of Exam: 02/27/2023 Medical Rec #:  [id number]   Height:       75.0 in Accession #:    [id number]  Weight:       213.8 lb Date of Birth:  [birth year]    BSA:          2.258 m Patient Age:    72 years    BP:           122/85 mmHg Patient Gender: M           HR:           86 bpm. Exam Location:  [facility] Procedure: 2D Echo, Cardiac Doppler and Color Doppler Indications:     Bacteremia  History:         Patient has prior history of Echocardiogram examinations, most  [facility] CARDIOLOGY CONSULT NOTE       Patient ID: Andrew Harding MRN: [id number] DOB/AGE: 01/26/1951 72 y.o.  Admit date: 02/26/2023 Referring Physician Dr. Esaw Grandchild Primary Physician Lynnea Ferrier, MD  Primary Cardiologist Dr. Gwen Pounds (last seen 2019) Reason for Consultation MSSA bacteremia  HPI: Andrew Harding is a 72 y.o. male  with a past medical history of CAD s/p CABG x 4 (2014, [facility]), atrial fibrillation on Eliquis, hypertension, PAD s/p transmetatarsal amputation L foot, DM2 who presented to the ED on 02/26/2023 shortness of breath and chills.  Patient currently being treated for sepsis, MSSA bacteremia, inpatient echo with concern for possible mobile echodensity on aortic valve.  Cardiology was consulted for further evaluation.   Patient reports that he has been dealing with ongoing issues from diabetic foot wound for the last few months.  Was just seen by podiatry few weeks ago due to concern for drainage from his wound.  He has had more redness since this appointment.  Prior to his presentation to the ED on 10/28 he states that he had been feeling more fatigued and had a near syncopal episode when going up some stairs.  Decided to come to the ED for further evaluation.  Workup in the ED notable for creatinine 1.76, potassium 4.3, hemoglobin 11.4, WBC 11.1, lactic acid 3.2.  EKG demonstrated atrial fibrillation with rapid ventricular response, patient was diagnosed with atrial fibrillation in April of this year.  Given lab work and worsening redness/drainage of left foot wound there was concern for osteomyelitis, sepsis and he was started on IV antibiotics.  Heart rate improved with IV fluid resuscitation.  Since admission he had not echocardiogram done which revealed normal EF, possible mobile echogenicity on his aortic valve.  At the time my evaluation this afternoon patient is resting comfortably in hospital bed, reports that he is scheduled to go for surgery on his left  foot this afternoon.  Patient scheduled for I&D in the OR.  States that overall he feels better than when he first came in.  Heart rate is improved.  He denies any chest pain, shortness of breath, palpitations.  Overall he has no complaints.  Discussed the need for further evaluation of aortic valve with TEE procedure.  Review of systems complete and found to be negative unless listed above    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   Tubular adenoma    Vitamin D deficiency     Past Surgical History:  Procedure Laterality Date   AMPUTATION Left 08/19/2022   Procedure: TRANSMETATARSAL AMPUTATION LEFT FOOT WITH IRRIGATION AND DEBRIDEMENT;  Surgeon: Rosetta Posner, DPM;  Location: [facility] OR[facility];  Service: Podiatry;  Laterality: Left;   AMPUTATION TOE Right 06/01/2015   Procedure: AMPUTATION TOE;  Surgeon: Recardo Evangelist, DPM;  Location: [facility];  Service: Podiatry;  Laterality: Right;   AMPUTATION TOE Left 08/11/2022   Procedure: AMPUTATION TOE 2, 3, 4;  Surgeon: [name], DPM;  Location: [facility];  Service: Podiatry;  Laterality:  [facility] CARDIOLOGY CONSULT NOTE       Patient ID: Andrew Harding MRN: [id number] DOB/AGE: 01/26/1951 72 y.o.  Admit date: 02/26/2023 Referring Physician Dr. Esaw Grandchild Primary Physician Lynnea Ferrier, MD  Primary Cardiologist Dr. Gwen Pounds (last seen 2019) Reason for Consultation MSSA bacteremia  HPI: Andrew Harding is a 72 y.o. male  with a past medical history of CAD s/p CABG x 4 (2014, [facility]), atrial fibrillation on Eliquis, hypertension, PAD s/p transmetatarsal amputation L foot, DM2 who presented to the ED on 02/26/2023 shortness of breath and chills.  Patient currently being treated for sepsis, MSSA bacteremia, inpatient echo with concern for possible mobile echodensity on aortic valve.  Cardiology was consulted for further evaluation.   Patient reports that he has been dealing with ongoing issues from diabetic foot wound for the last few months.  Was just seen by podiatry few weeks ago due to concern for drainage from his wound.  He has had more redness since this appointment.  Prior to his presentation to the ED on 10/28 he states that he had been feeling more fatigued and had a near syncopal episode when going up some stairs.  Decided to come to the ED for further evaluation.  Workup in the ED notable for creatinine 1.76, potassium 4.3, hemoglobin 11.4, WBC 11.1, lactic acid 3.2.  EKG demonstrated atrial fibrillation with rapid ventricular response, patient was diagnosed with atrial fibrillation in April of this year.  Given lab work and worsening redness/drainage of left foot wound there was concern for osteomyelitis, sepsis and he was started on IV antibiotics.  Heart rate improved with IV fluid resuscitation.  Since admission he had not echocardiogram done which revealed normal EF, possible mobile echogenicity on his aortic valve.  At the time my evaluation this afternoon patient is resting comfortably in hospital bed, reports that he is scheduled to go for surgery on his left  foot this afternoon.  Patient scheduled for I&D in the OR.  States that overall he feels better than when he first came in.  Heart rate is improved.  He denies any chest pain, shortness of breath, palpitations.  Overall he has no complaints.  Discussed the need for further evaluation of aortic valve with TEE procedure.  Review of systems complete and found to be negative unless listed above    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   Tubular adenoma    Vitamin D deficiency     Past Surgical History:  Procedure Laterality Date   AMPUTATION Left 08/19/2022   Procedure: TRANSMETATARSAL AMPUTATION LEFT FOOT WITH IRRIGATION AND DEBRIDEMENT;  Surgeon: Rosetta Posner, DPM;  Location: [facility];  Service: Podiatry;  Laterality: Left;   AMPUTATION TOE Right 06/01/2015   Procedure: AMPUTATION TOE;  Surgeon: Recardo Evangelist, DPM;  Location: [facility];  Service: Podiatry;  Laterality: Right;   AMPUTATION TOE Left 08/11/2022   Procedure: AMPUTATION TOE 2, 3, 4;  Surgeon: [name], DPM;  Location: [facility];  Service: Podiatry;  Laterality:  [facility] CARDIOLOGY CONSULT NOTE       Patient ID: Andrew Harding MRN: [id number] DOB/AGE: 01/26/1951 72 y.o.  Admit date: 02/26/2023 Referring Physician Dr. Esaw Grandchild Primary Physician Lynnea Ferrier, MD  Primary Cardiologist Dr. Gwen Pounds (last seen 2019) Reason for Consultation MSSA bacteremia  HPI: Andrew Harding is a 72 y.o. male  with a past medical history of CAD s/p CABG x 4 (2014, [facility]), atrial fibrillation on Eliquis, hypertension, PAD s/p transmetatarsal amputation L foot, DM2 who presented to the ED on 02/26/2023 shortness of breath and chills.  Patient currently being treated for sepsis, MSSA bacteremia, inpatient echo with concern for possible mobile echodensity on aortic valve.  Cardiology was consulted for further evaluation.   Patient reports that he has been dealing with ongoing issues from diabetic foot wound for the last few months.  Was just seen by podiatry few weeks ago due to concern for drainage from his wound.  He has had more redness since this appointment.  Prior to his presentation to the ED on 10/28 he states that he had been feeling more fatigued and had a near syncopal episode when going up some stairs.  Decided to come to the ED for further evaluation.  Workup in the ED notable for creatinine 1.76, potassium 4.3, hemoglobin 11.4, WBC 11.1, lactic acid 3.2.  EKG demonstrated atrial fibrillation with rapid ventricular response, patient was diagnosed with atrial fibrillation in April of this year.  Given lab work and worsening redness/drainage of left foot wound there was concern for osteomyelitis, sepsis and he was started on IV antibiotics.  Heart rate improved with IV fluid resuscitation.  Since admission he had not echocardiogram done which revealed normal EF, possible mobile echogenicity on his aortic valve.  At the time my evaluation this afternoon patient is resting comfortably in hospital bed, reports that he is scheduled to go for surgery on his left  foot this afternoon.  Patient scheduled for I&D in the OR.  States that overall he feels better than when he first came in.  Heart rate is improved.  He denies any chest pain, shortness of breath, palpitations.  Overall he has no complaints.  Discussed the need for further evaluation of aortic valve with TEE procedure.  Review of systems complete and found to be negative unless listed above    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   Tubular adenoma    Vitamin D deficiency     Past Surgical History:  Procedure Laterality Date   AMPUTATION Left 08/19/2022   Procedure: TRANSMETATARSAL AMPUTATION LEFT FOOT WITH IRRIGATION AND DEBRIDEMENT;  Surgeon: Rosetta Posner, DPM;  Location: [facility];  Service: Podiatry;  Laterality: Left;   AMPUTATION TOE Right 06/01/2015   Procedure: AMPUTATION TOE;  Surgeon: Recardo Evangelist, DPM;  Location: [facility];  Service: Podiatry;  Laterality: Right;   AMPUTATION TOE Left 08/11/2022   Procedure: AMPUTATION TOE 2, 3, 4;  Surgeon: [name], DPM;  Location: [facility];  Service: Podiatry;  Laterality:  Physically Abused: No    Sexually Abused: No    Family History  Problem Relation Age of Onset   Diabetes Mellitus II Mother    CAD Mother      Vitals:   02/27/23 2254 02/28/23 0401 02/28/23 0741 02/28/23 1121  BP: 122/66 (!) 100/51 128/76 120/79  Pulse: 81 83 76 81  Resp: 18 18 18 20   Temp: 99 F (37.2 C) 98.6 F (37 C) 98.5 F (36.9 C) 97.9 F (36.6 C)  TempSrc: Oral  Oral Oral  SpO2: 97% 97% 96% 99%  Weight:      Height:        PHYSICAL EXAM General: Well appearing, well nourished, in no acute distress. HEENT: Normocephalic and atraumatic. Neck: No JVD.  Lungs: Normal respiratory effort on room air. Clear bilaterally to auscultation. No wheezes, crackles, rhonchi.  Heart: Irregularly irregular. Normal S1 and S2 without gallops or murmurs.  Abdomen: Non-distended appearing.  Msk: Normal strength and tone for age. Extremities: Warm and well perfused. No clubbing, cyanosis. No edema.  Neuro: Alert and oriented X 3. Psych: Answers questions appropriately.   Labs: Basic Metabolic Panel: Recent Labs    02/26/23 1630 02/27/23 0421  NA 132* 131*  K 4.3 3.8  CL 97* 101  CO2 23 23  GLUCOSE 255* 177*  BUN 23 18  CREATININE 1.76* 1.39*  CALCIUM 8.9 8.2*   Liver Function Tests: Recent Labs    02/26/23 1630  AST 27  ALT 28  ALKPHOS 64  BILITOT 0.8  PROT 8.4*  ALBUMIN 3.7   No results for input(s): "LIPASE", "AMYLASE" in the last 72 hours. CBC: Recent Labs    02/26/23 1630 02/27/23 0421  WBC 11.1* 9.2  NEUTROABS 10.1*  --   HGB 11.4* 9.9*  HCT 35.1* 29.5*  MCV 89.8 88.9  PLT 357 265   Cardiac Enzymes: No results for input(s): "CKTOTAL", "CKMB", "CKMBINDEX", "TROPONINIHS" in the last 72 hours. BNP: No results for input(s): "BNP" in the  last 72 hours. D-Dimer: No results for input(s): "DDIMER" in the last 72 hours. Hemoglobin A1C: Recent Labs    02/26/23 1630  HGBA1C 7.5*   Fasting Lipid Panel: No results for input(s): "CHOL", "HDL", "LDLCALC", "TRIG", "CHOLHDL", "LDLDIRECT" in the last 72 hours. Thyroid Function Tests: No results for input(s): "TSH", "T4TOTAL", "T3FREE", "THYROIDAB" in the last 72 hours.  Invalid input(s): "FREET3" Anemia Panel: No results for input(s): "VITAMINB12", "FOLATE", "FERRITIN", "TIBC", "IRON", "RETICCTPCT" in the last 72 hours.   Radiology: ECHOCARDIOGRAM COMPLETE  Result Date: 02/27/2023    ECHOCARDIOGRAM REPORT   Patient Name:   Andrew Harding Date of Exam: 02/27/2023 Medical Rec #:  [id number]   Height:       75.0 in Accession #:    [id number]  Weight:       213.8 lb Date of Birth:  [birth year]    BSA:          2.258 m Patient Age:    72 years    BP:           122/85 mmHg Patient Gender: M           HR:           86 bpm. Exam Location:  [facility] Procedure: 2D Echo, Cardiac Doppler and Color Doppler Indications:     Bacteremia  History:         Patient has prior history of Echocardiogram examinations, most  Physically Abused: No    Sexually Abused: No    Family History  Problem Relation Age of Onset   Diabetes Mellitus II Mother    CAD Mother      Vitals:   02/27/23 2254 02/28/23 0401 02/28/23 0741 02/28/23 1121  BP: 122/66 (!) 100/51 128/76 120/79  Pulse: 81 83 76 81  Resp: 18 18 18 20   Temp: 99 F (37.2 C) 98.6 F (37 C) 98.5 F (36.9 C) 97.9 F (36.6 C)  TempSrc: Oral  Oral Oral  SpO2: 97% 97% 96% 99%  Weight:      Height:        PHYSICAL EXAM General: Well appearing, well nourished, in no acute distress. HEENT: Normocephalic and atraumatic. Neck: No JVD.  Lungs: Normal respiratory effort on room air. Clear bilaterally to auscultation. No wheezes, crackles, rhonchi.  Heart: Irregularly irregular. Normal S1 and S2 without gallops or murmurs.  Abdomen: Non-distended appearing.  Msk: Normal strength and tone for age. Extremities: Warm and well perfused. No clubbing, cyanosis. No edema.  Neuro: Alert and oriented X 3. Psych: Answers questions appropriately.   Labs: Basic Metabolic Panel: Recent Labs    02/26/23 1630 02/27/23 0421  NA 132* 131*  K 4.3 3.8  CL 97* 101  CO2 23 23  GLUCOSE 255* 177*  BUN 23 18  CREATININE 1.76* 1.39*  CALCIUM 8.9 8.2*   Liver Function Tests: Recent Labs    02/26/23 1630  AST 27  ALT 28  ALKPHOS 64  BILITOT 0.8  PROT 8.4*  ALBUMIN 3.7   No results for input(s): "LIPASE", "AMYLASE" in the last 72 hours. CBC: Recent Labs    02/26/23 1630 02/27/23 0421  WBC 11.1* 9.2  NEUTROABS 10.1*  --   HGB 11.4* 9.9*  HCT 35.1* 29.5*  MCV 89.8 88.9  PLT 357 265   Cardiac Enzymes: No results for input(s): "CKTOTAL", "CKMB", "CKMBINDEX", "TROPONINIHS" in the last 72 hours. BNP: No results for input(s): "BNP" in the  last 72 hours. D-Dimer: No results for input(s): "DDIMER" in the last 72 hours. Hemoglobin A1C: Recent Labs    02/26/23 1630  HGBA1C 7.5*   Fasting Lipid Panel: No results for input(s): "CHOL", "HDL", "LDLCALC", "TRIG", "CHOLHDL", "LDLDIRECT" in the last 72 hours. Thyroid Function Tests: No results for input(s): "TSH", "T4TOTAL", "T3FREE", "THYROIDAB" in the last 72 hours.  Invalid input(s): "FREET3" Anemia Panel: No results for input(s): "VITAMINB12", "FOLATE", "FERRITIN", "TIBC", "IRON", "RETICCTPCT" in the last 72 hours.   Radiology: ECHOCARDIOGRAM COMPLETE  Result Date: 02/27/2023    ECHOCARDIOGRAM REPORT   Patient Name:   Andrew Harding Date of Exam: 02/27/2023 Medical Rec #:  [id number]   Height:       75.0 in Accession #:    [id number]  Weight:       213.8 lb Date of Birth:  [birth year]    BSA:          2.258 m Patient Age:    72 years    BP:           122/85 mmHg Patient Gender: M           HR:           86 bpm. Exam Location:  [facility] Procedure: 2D Echo, Cardiac Doppler and Color Doppler Indications:     Bacteremia  History:         Patient has prior history of Echocardiogram examinations, most

## 2023-02-28 NOTE — Progress Notes (Signed)
Progress Note   [name] ONG:[id number] DOB: 1951-01-17 DOA: 02/26/2023     2 DOS: the patient was seen and examined on 02/28/2023   Brief hospital course: EMAD BROCKETT is a 72 y.o. male with medical history significant for insulin-dependent DM 2, A-fib on Eliquis, HTN, CAD status post CABG, CKD 3B, PAD s/p transmetatarsal amputation left foot  who is being admitted with a foot wound infection with sepsis and A fib with RVR after presenting with a presyncopal episode.  Patient was last seen by podiatry 2 weeks ago on 02/12/2023 with concern for draining and bleeding from a wound at the bottom of his foot   He was prescribed Santyl dressings.  Since his visit however, he has noticed more redness to the area.  Additionally he has been feeling more fatigued and then today while at work, while going up some stairs he felt like he  became acutely short of breath and felt like he was going to pass out , thus prompting the visit to the ED. ED course and data review: On arrival tachycardic to up to 136 and tachypneic to 22 but afebrile and with SBP in the 130s to 150s. Labs: WBC 11,000 with lactic acid 3.2--1.7. Hemoglobin at baseline at 11.4 Creatinine 1.76 up from baseline of 1.13 about 6 months prior Blood glucose 255 EKG, personally viewed and interpreted showing A-fib at 124 with no ischemic changes. Foot x-ray showing possible osteomyelitis with recommendation for MRI as follows IMPRESSION: Transmetatarsal amputation of the left foot with soft tissue swelling and irregularity about the stump of the first metatarsal. Questionable loss of cortical distinctness about the stumps of the first and second metatarsals. Osteomyelitis is not excluded. Consider MRI further evaluation.   Chest x-ray nonacute   Patient started on vancomycin and Rocephin   Hospitalist consulted for admission.     Assessment and Plan:  Severe sepsis with acute organ dysfunction (AKI) Diabetic foot infection  with possible osteomyelitis, left  (HCC) PAD s/p left transmetatarsal amputation 07/2022 MSSA Bacteremia Sepsis criteria include tachycardia, tachypnea, leukocytosis and lactic acidosis with concerns for wound infection and possible osteomyelitis Blood cultures yielded MSSA Initially on Rocephin and vancomycin >> Ancef Patient serum creatinine on admission was 1.76 and has improved with IV fluid hydration Completed sepsis fluids. Cardiology consulted for TEE.  2d echo no vegetation seen ID following --  Continue Ancef Follow cultures Podiatry following -- taking for surgical debridement this afternoon     * Postural dizziness with presyncope Secondary to sepsis and A-fib with RVR Improved Continuous cardiac monitoring and neurologic checks      Atrial fibrillation with RVR (HCC) Likely driven by sepsis Improved with IV fluids for treatment of sepsis and continuation of home metoprolol Continuous cardiac monitoring Eliquis on hold in anticipation of possible wound debridement Patient not on any heparin bridge due to active bleeding from his TMA stump    AKI (acute kidney injury) (HCC) creatinine 1.76 up from baseline of 1.13 about 6 months prior Likely prerenal, related to sepsis Renal function has improved with IV fluid hydration Monitor renal function and avoid nephrotoxins    Controlled type 2 diabetes mellitus with neuropathy (HCC) Continue basal insulin with sliding scale insulin coverage Continue gabapentin    CAD S/P CABG x 4 PAD s/p left transmetatarsal amputation Continue metoprolol and statin Plavix is on hold for possible procedure         Subjective: Patient awake resting in bed this AM.  Awaiting to  go to OR with podiatry this afternoon. He overall denies complaints, but understandably worried about infection and further surgery, and longer term potential changes to his functional abilities.   Physical Exam: Vitals:   02/27/23 2254 02/28/23 0401  02/28/23 0741 02/28/23 1121  BP: 122/66 (!) 100/51 128/76 120/79  Pulse: 81 83 76 81  Resp: 18 18 18 20   Temp: 99 F (37.2 C) 98.6 F (37 C) 98.5 F (36.9 C) 97.9 F (36.6 C)  TempSrc: Oral  Oral Oral  SpO2: 97% 97% 96% 99%  Weight:      Height:       General exam: awake, alert, no acute distress HEENT: atraumatic, clear conjunctiva, anicteric sclera, moist mucus membranes, hearing grossly normal  Respiratory system: CTAB, no wheezes, rales or rhonchi, normal respiratory effort. Cardiovascular system: normal S1/S2, RRR, no JVD, murmurs, rubs, gallops, no pedal edema.   Gastrointestinal system: soft, NT, ND, no HSM felt, +bowel sounds. Central nervous system: A&O x 3. no gross focal neurologic deficits, normal speech Extremities: left foot dressing clean dry intact, small amount visible serosanguinous drainage on plantar aspect of dressing Skin: dry, intact, normal temperature Psychiatry: normal mood, congruent affect, judgement and insight appear normal   Data Reviewed: Labs reviewed from 10/29.  Hemoglobin 9.9, creatinine 1.39 No new labs today CBG's at goal   Family Communication: Plan of care discussed with patient in detail.  He verbalizes understanding and agrees with the plan  Disposition: Status is: Inpatient Remains inpatient appropriate because: remains on empiric IV antibiotics pending cultures   Planned Discharge Destination:  TBD    Time spent: 38 minutes  Author: Pennie Banter, DO 02/28/2023 2:25 PM  For on call review [email or web address].

## 2023-02-28 NOTE — Transfer of Care (Signed)
Immediate Anesthesia Transfer of Care Note  [name]  Procedure(s) Performed: Procedure(s): IRRIGATION AND DEBRIDEMENT FOOT (Left)  Patient Location: PACU  Anesthesia Type:General  Level of Consciousness: sedated  Airway & Oxygen Therapy: Patient Spontanous Breathing and Patient connected to face mask oxygen  Post-op Assessment: Report given to RN and Post -op Vital signs reviewed and stable  Post vital signs: Reviewed and stable  Last Vitals:  Vitals:   02/28/23 1519 02/28/23 1706  BP: 130/82 94/61  Pulse: 81 97  Resp: 18 18  Temp: 36.6 C (!) 36.2 C  SpO2: 100% 97%    Complications: No apparent anesthesia complications

## 2023-02-28 NOTE — Op Note (Signed)
Operative note   Surgeon:Beck Cofer    Assistant:None    Preop diagnosis: 1.  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  Full-thickness ulcer plantar left medial arch to tendon and muscle    Postop diagnosis: Same    Procedure: 1.  Excision of bone with delayed primary closure ulceration left first metatarsal 2.  Full-thickness excisional debridement left medial arch    EBL: Minimal    Anesthesia:local and IV sedation.  Local consists of a total of 10 cc of a one-to-one mixture of 0.25% bupivacaine and lidocaine with epinephrine    Hemostasis: Lidocaine with epinephrine infiltrated along surgical sites    Specimen: 1 bone first metatarsal for pathology 2.  Bone for wound culture 3.  Tissue swab for wound culture    Complications: None    Operative indications:Andrew Harding is an 72 y.o. that presents today for surgical intervention.  The risks/benefits/alternatives/complications have been discussed and consent has been given.    Procedure:  Patient was brought into the OR and placed on the operating table in thesupine position. After anesthesia was obtained theleft lower extremity was prepped and draped in usual sterile fashion.  Attention was directed to the distal aspect of the left forefoot at the previous transmetatarsal amputation site.  There was a necrotic full-thickness ulcer on the very distal aspect of the left medial portion of the foot.  Full-thickness excisional dissection was performed.  All necrotic tissue was removed.  Slight amount of purulent drainage was noted.  A wound culture was performed with a tissue swab.  Next the distal portion of the metatarsal was exposed.  The metatarsal osteotomy was created proximal to the grossly infected and contaminated bone.  A small sample of the bone was sent for wound culture.  A proximal margin was removed and inked and sent for pathological examination.  At this time the wound was then excisionally debrided with a  Versajet down to and including the bone.  The wound was flushed with copious amounts of irrigation.  Next in the plantar medial left arch a 5 cm x 1.5 cm ulceration was noted down to the level of the fascia.  Excisional debridement was performed with a Versajet.  All nonviable tissue and fibrotic tissue was removed from the field.  Finally tissue rearrangement locally was performed to the distal first metatarsal site.  The ulceration had been full-thickness excised.  Delayed primary closure was performed with a 2-0 nylon.  The most distal aspect was packed.  The wound both plantar and first metatarsal site was infiltrated with cellerate collagen graft.  A bulky sterile dressing was then applied to the forefoot.    Patient tolerated the procedure and anesthesia well.  Was transported from the OR to the PACU with all vital signs stable and vascular status intact. To be discharged per routine protocol.  Will follow up in approximately 1 week in the outpatient clinic.

## 2023-02-28 NOTE — TOC Initial Note (Signed)
Transition of Care [facility]) - Initial/Assessment Note    Patient Details  Name: Andrew Harding MRN: [id number] Date of Birth: January 02, 1951  [facility] CM/SW Contact:    Truddie Hidden, RN Phone Number: 02/28/2023, 12:41 PM  Clinical Narrative:                 RA completed.   Admitted for: dizziness and near syncope r/t sepsis Admitted from: home alone  PCP: Graciela Husbands [facility]acy: [facility]  Current home health/prior home health/DME: walker, cane  HH: No preference  Transportation: drives himself, friend will pick him up         Patient Goals and CMS Choice            Expected Discharge Plan and Services                                              Prior Living Arrangements/Services                       Activities of Daily Living   ADL Screening (condition at time of admission) Independently performs ADLs?: Yes (appropriate for developmental age) Is the patient deaf or have difficulty hearing?: Yes Does the patient have difficulty seeing, even when wearing glasses/contacts?: No Does the patient have difficulty concentrating, remembering, or making decisions?: No  Permission Sought/Granted                  Emotional Assessment              Admission diagnosis:  Cellulitis of foot [L03.119] Atrial fibrillation with rapid ventricular response (HCC) [I48.91] Postural dizziness with presyncope [R42, R55] Patient Active Problem List   Diagnosis Date Noted   Controlled type 2 diabetes mellitus with neuropathy (HCC) 02/27/2023   Postural dizziness with presyncope 02/26/2023   Diabetic peripheral neuropathy (HCC)    AKI (acute kidney injury) (HCC) 09/05/2022   Wound drainage 09/02/2022   Diabetic foot infection with possible osteomyelitis, left  (HCC) 08/29/2022   S/P transmetatarsal amputation of foot, left (HCC) 08/29/2022   Acute osteomyelitis of left ankle or foot (HCC) 08/24/2022   Medication management 08/24/2022    Diabetic infection of left foot (HCC) 08/24/2022   Infection of left foot 08/19/2022   Atrial fibrillation with RVR (HCC) 08/19/2022   Sepsis (HCC) 08/17/2022   Cellulitis 08/17/2022   Hypotension due to hypovolemia 08/17/2022   Gout 04/05/2018   Vitamin D deficiency, unspecified 04/05/2018   Diabetic ulcer of toe of left foot associated with type 2 diabetes mellitus, limited to breakdown of skin (HCC) 12/21/2017   Status post amputation of toe of right foot (HCC) 11/01/2015   CKD (chronic kidney disease) stage 3, GFR 30-59 ml/min (HCC) 10/08/2015   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   Osteomyelitis (HCC) 05/31/2015   Type 2 diabetes mellitus with stage 3b chronic kidney disease, with long-term current use of insulin (HCC) 05/31/2015   History of osteomyelitis 05/31/2015   Benign essential hypertension 09/14/2014   Coronary artery disease 09/17/2012   Mixed hyperlipidemia 09/17/2012   CAD 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   PCP:  Lynnea Ferrier, MD Pharmacy:   [address] [place], [place] - [address] [place] [address] Phone: [phone] Fax: [phone]  [id number] [place], [place] - [address] AT [facility] OF [address] [place] [address] Phone: [phone] Fax: [phone]     Social Determinants of Health (SDOH) Social History: SDOH Screenings   Food Insecurity: No Food Insecurity (02/27/2023)  Housing: Low Risk  (02/27/2023)  Transportation Needs: No Transportation Needs (02/27/2023)  Utilities: Not At Risk (02/27/2023)  Depression (PHQ2-9): Low Risk  (09/14/2022)  Financial Resource Strain: Low Risk  (12/13/2022)   Received from [facility]  Tobacco Use: Medium Risk (02/26/2023)   SDOH Interventions:     Readmission Risk Interventions     No data to display

## 2023-02-28 NOTE — Anesthesia Preprocedure Evaluation (Signed)
Anesthesia Evaluation  Patient identified by MRN, date of birth, ID band Patient awake    Reviewed: Allergy & Precautions, NPO status , Patient's Chart, lab work & pertinent test results  History of Anesthesia Complications Negative for: history of anesthetic complications  Airway Mallampati: III  TM Distance: <3 FB Neck ROM: full    Dental  (+) Chipped, Poor Dentition, Dental Advidsory Given   Pulmonary neg shortness of breath, neg COPD, neg recent URI, former smoker   Pulmonary exam normal        Cardiovascular Exercise Tolerance: Good hypertension, (-) angina + CAD and + CABG  (-) Past MI and (-) Cardiac Stents + dysrhythmias Atrial Fibrillation + Valvular Problems/Murmurs      Neuro/Psych neg Seizures  Neuromuscular disease  negative psych ROS   GI/Hepatic negative GI ROS, Neg liver ROS,neg GERD  ,,  Endo/Other  diabetes, Type 2    Renal/GU Renal disease  negative genitourinary   Musculoskeletal   Abdominal   Peds  Hematology negative hematology ROS (+)   Anesthesia Other Findings Past Medical History: No date: Bladder neck obstruction No date: Chronic kidney disease No date: Coronary artery disease     Comment:  a.) s/p 4v CABG in 2014 No date: Diabetes mellitus without complication (HCC) No date: Diabetic neuropathy (HCC) No date: Diabetic peripheral neuropathy (HCC) No date: Diverticulosis No date: Gout No date: Heart murmur No date: Hypercholesteremia No date: Hyperlipidemia No date: Hypertension No date: Peripheral neuropathy 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 No date: Tubular adenoma No date: Vitamin D deficiency  Past Surgical History: 08/19/2022: AMPUTATION; Left     Comment:  Procedure: TRANSMETATARSAL AMPUTATION LEFT FOOT WITH               IRRIGATION AND DEBRIDEMENT;  Surgeon: Rosetta Posner, DPM;              Location: [facility] ORS;  Service: Podiatry;  Laterality:               Left; 06/01/2015:  AMPUTATION TOE; Right     Comment:  Procedure: AMPUTATION TOE;  Surgeon: Recardo Evangelist,               DPM;  Location: [facility];  Service: Podiatry;                Laterality: Right; 08/11/2022: AMPUTATION TOE; Left     Comment:  Procedure: AMPUTATION TOE 2, 3, 4;  Surgeon: [name], DPM;  Location: [facility];  Service: Podiatry;                Laterality: Left; No date: CATARACT EXTRACTION, BILATERAL 06/12/2022: CIRCUMCISION; N/A     Comment:  Procedure: CIRCUMCISION ADULT;  Surgeon: Vanna Scotland, MD;  Location: [facility] OR[facility];  Service: Urology;                Laterality: N/A; 02/14/2016: COLONOSCOPY WITH PROPOFOL; N/A     Comment:  Procedure: COLONOSCOPY WITH PROPOFOL;  Surgeon: Christena Deem, MD;  Location: [facility] ENDOSCOPY;  Service:               Endoscopy;  Laterality: N/A; 01/07/2019: COLONOSCOPY WITH PROPOFOL; N/A     Comment:  Procedure: COLONOSCOPY WITH PROPOFOL;  Surgeon:  Christena Deem, MD;  Location: [facility] ENDOSCOPY;                Service: Endoscopy;  Laterality: N/A; 08/2012: CORONARY ARTERY BYPASS GRAFT; N/A 06/01/2015: EXCISION PARTIAL PHALANX; Right     Comment:  Procedure: EXCISION PARTIAL PHALANX /  BONE;  Surgeon:               Recardo Evangelist, DPM;  Location: [facility];  Service:               Podiatry;  Laterality: Right; 05/29/2016: FLEXIBLE SIGMOIDOSCOPY; N/A     Comment:  Procedure: FLEXIBLE SIGMOIDOSCOPY;  Surgeon: Christena Deem, MD;  Location: [facility] ENDOSCOPY;  Service:               Endoscopy;  Laterality: N/A; 08/23/2022: INCISION AND DRAINAGE; Left     Comment:  Procedure: INCISION AND DRAINAGE;  Surgeon: [name], DPM;  Location: [facility];  Service: Podiatry;                Laterality: Left; No date: KNEE ARTHROSCOPY; Left 08/22/2022: LOWER EXTREMITY ANGIOGRAPHY; Left     Comment:  Procedure: Lower Extremity Angiography;  Surgeon:               Renford Dills, MD;  Location: [facility] INVASIVE CV LAB;               Service: Cardiovascular;  Laterality: Left;     Reproductive/Obstetrics negative OB ROS                             Anesthesia Physical Anesthesia Plan  ASA: 3  Anesthesia Plan: General   Post-op Pain Management:    Induction: Intravenous  PONV Risk Score and Plan: Propofol infusion and TIVA  Airway Management Planned: Natural Airway and Nasal Cannula  Additional Equipment:   Intra-op Plan:   Post-operative Plan:   Informed Consent: I have reviewed the patients History and Physical, chart, labs and discussed the procedure including the risks, benefits and alternatives for the proposed anesthesia with the patient or authorized representative who has indicated his/her understanding and acceptance.     Dental Advisory Given  Plan Discussed with: Anesthesiologist, CRNA and Surgeon  Anesthesia Plan Comments: (Patient consented for risks of anesthesia including but not limited to:  - adverse reactions to medications - risk of airway placement if required - damage to eyes, teeth, lips or other oral mucosa - nerve damage due to positioning  - sore throat or hoarseness - Damage to heart, brain, nerves, lungs, other parts of body or loss of life  Patient voiced understanding.)       Anesthesia Quick Evaluation

## 2023-02-28 NOTE — Progress Notes (Signed)
ID Pt doing fine Going for left foot surgery now BP 126/76 (BP Location: Left Arm)   Pulse 91   Temp 98.6 F (37 C)   Resp 18   Ht 6\' 3"  (1.905 m)   Wt 97 kg   SpO2 97%   BMI 26.73 kg/m   Chest b/l air entry Hss1s2 Abd soft CNS non focal  Labs    Latest Ref Rng & Units 02/27/2023    4:21 AM 02/26/2023    4:30 PM 09/04/2022   12:32 AM  CBC  WBC 4.0 - 10.5 K/uL 9.2  11.1  7.0   Hemoglobin 13.0 - 17.0 g/dL 9.9  16.1  9.9   Hematocrit 39.0 - 52.0 % 29.5  35.1  31.4   Platelets 150 - 400 K/uL 265  357  358        Latest Ref Rng & Units 02/27/2023    4:21 AM 02/26/2023    4:30 PM 09/05/2022    4:15 AM  CMP  Glucose 70 - 99 mg/dL 096  045  409   BUN 8 - 23 mg/dL 18  23  35   Creatinine 0.61 - 1.24 mg/dL 8.11  9.14  7.82   Sodium 135 - 145 mmol/L 131  132  135   Potassium 3.5 - 5.1 mmol/L 3.8  4.3  3.9   Chloride 98 - 111 mmol/L 101  97  104   CO2 22 - 32 mmol/L 23  23  24    Calcium 8.9 - 10.3 mg/dL 8.2  8.9  8.9   Total Protein 6.5 - 8.1 g/dL  8.4    Total Bilirubin 0.3 - 1.2 mg/dL  0.8    Alkaline Phos 38 - 126 U/L  64    AST 15 - 41 U/L  27    ALT 0 - 44 U/L  28      Micro- BC MSSA Impression/recommendation  MSSA bacteremia secondary to left foot infection with ulcer Patient underwent 2D echo and there is a question of a degenerative versus vegetation on the aortic valve He needs TEE to confirm it Patient is currently on cefazolin  Will repeat blood culture tomorrow  Diabetes mellitus with peripheral neuropathy and diabetic foot infection  History of left TMA Ulcer on the plantar surface Osteomyelitis He is going for surgery today  CAD status post CABG  A-fib on Eliquis and metoprolol  AKA  Previous infection of the left foot after thermal injury leading to TMA and Enterobacter and Enterococcus infection and he got 4 weeks of IV meropenem followed by 2 weeks of oral antibiotic earlier this year  Discussed the management with the patient and the care  team

## 2023-03-01 ENCOUNTER — Inpatient Hospital Stay: Payer: Managed Care, Other (non HMO) | Admitting: Registered Nurse

## 2023-03-01 ENCOUNTER — Inpatient Hospital Stay
Admit: 2023-03-01 | Discharge: 2023-03-01 | Disposition: A | Discharge status: Home / Self Care | Payer: Managed Care, Other (non HMO) | Attending: Student | Admitting: Student

## 2023-03-01 ENCOUNTER — Encounter: Payer: Self-pay | Admitting: Podiatry

## 2023-03-01 ENCOUNTER — Encounter
Admission: EM | Disposition: A | Discharge status: Home Health | Payer: Self-pay | Source: Home / Self Care | Attending: Internal Medicine

## 2023-03-01 DIAGNOSIS — R7881 Bacteremia: Secondary | ICD-10-CM | POA: Diagnosis not present

## 2023-03-01 DIAGNOSIS — M86272 Subacute osteomyelitis, left ankle and foot: Secondary | ICD-10-CM | POA: Diagnosis not present

## 2023-03-01 DIAGNOSIS — R55 Syncope and collapse: Secondary | ICD-10-CM | POA: Diagnosis not present

## 2023-03-01 DIAGNOSIS — B9561 Methicillin susceptible Staphylococcus aureus infection as the cause of diseases classified elsewhere: Secondary | ICD-10-CM | POA: Diagnosis not present

## 2023-03-01 DIAGNOSIS — R42 Dizziness and giddiness: Secondary | ICD-10-CM | POA: Diagnosis not present

## 2023-03-01 HISTORY — PX: TEE WITHOUT CARDIOVERSION: SHX5443

## 2023-03-01 LAB — CBC
HCT: 29.3 % — ABNORMAL LOW (ref 39.0–52.0)
Hemoglobin: 10 g/dL — ABNORMAL LOW (ref 13.0–17.0)
MCH: 29.2 pg (ref 26.0–34.0)
MCHC: 34.1 g/dL (ref 30.0–36.0)
MCV: 85.7 fL (ref 80.0–100.0)
Platelets: 285 10*3/uL (ref 150–400)
RBC: 3.42 MIL/uL — ABNORMAL LOW (ref 4.22–5.81)
RDW: 12.6 % (ref 11.5–15.5)
WBC: 7.2 10*3/uL (ref 4.0–10.5)
nRBC: 0 % (ref 0.0–0.2)

## 2023-03-01 LAB — GLUCOSE, CAPILLARY
Glucose-Capillary: 97 mg/dL (ref 70–99)
Glucose-Capillary: 227 mg/dL — ABNORMAL HIGH (ref 70–99)
Glucose-Capillary: 174 mg/dL — ABNORMAL HIGH (ref 70–99)
Glucose-Capillary: 257 mg/dL — ABNORMAL HIGH (ref 70–99)
Glucose-Capillary: 160 mg/dL — ABNORMAL HIGH (ref 70–99)

## 2023-03-01 LAB — CULTURE, BLOOD (ROUTINE X 2): Special Requests: ADEQUATE

## 2023-03-01 LAB — BASIC METABOLIC PANEL
Anion gap: 7 (ref 5–15)
BUN: 14 mg/dL (ref 8–23)
CO2: 24 mmol/L (ref 22–32)
Calcium: 8.2 mg/dL — ABNORMAL LOW (ref 8.9–10.3)
Chloride: 101 mmol/L (ref 98–111)
Creatinine, Ser: 1.28 mg/dL — ABNORMAL HIGH (ref 0.61–1.24)
GFR, Estimated: 59 mL/min — ABNORMAL LOW (ref >60)
Glucose, Bld: 265 mg/dL — ABNORMAL HIGH (ref 70–99)
Potassium: 4 mmol/L (ref 3.5–5.1)
Sodium: 132 mmol/L — ABNORMAL LOW (ref 135–145)

## 2023-03-01 LAB — ECHO TEE

## 2023-03-01 SURGERY — TRANSESOPHAGEAL ECHOCARDIOGRAM (TEE)
Anesthesia: General

## 2023-03-01 MED ORDER — LIDOCAINE VISCOUS HCL 2 % MT SOLN
OROMUCOSAL | Status: AC
  Filled 2023-03-01: qty 15

## 2023-03-01 MED ORDER — SODIUM CHLORIDE 0.9 % IV SOLN: INTRAVENOUS | Status: DC

## 2023-03-01 MED ORDER — BUTAMBEN-TETRACAINE-BENZOCAINE 2-2-14 % EX AERO
INHALATION_SPRAY | CUTANEOUS | Status: AC
  Filled 2023-03-01: qty 5

## 2023-03-01 MED ORDER — PROPOFOL 10 MG/ML IV BOLUS
INTRAVENOUS | Status: DC | PRN
  Administered 2023-03-01: 60 mg via INTRAVENOUS
  Administered 2023-03-01 (×2): 20 mg via INTRAVENOUS
  Administered 2023-03-01: 10 mg via INTRAVENOUS

## 2023-03-01 NOTE — Anesthesia Postprocedure Evaluation (Signed)
Anesthesia Post Note  [name]  Procedure(s) Performed: TRANSESOPHAGEAL ECHOCARDIOGRAM  Patient location during evaluation: Phase II Anesthesia Type: General Level of consciousness: awake and awake and alert Pain management: satisfactory to patient Vital Signs Assessment: post-procedure vital signs reviewed and stable Respiratory status: spontaneous breathing and nonlabored ventilation Cardiovascular status: blood pressure returned to baseline Anesthetic complications: no   No notable events documented.   Last Vitals:  Vitals:   03/01/23 1330 03/01/23 1345  BP: 128/76 118/85  Pulse: 68 75  Resp: 16 19  Temp:    SpO2: 98% 98%    Last Pain:  Vitals:   03/01/23 1345  TempSrc:   PainSc: 0-No pain                 VAN STAVEREN,Olga Seyler

## 2023-03-01 NOTE — Progress Notes (Signed)
ID Pt doing fine Underwent left foot surgery yesterday BP 118/85 (BP Location: Left Arm)   Pulse 75   Temp 98.2 F (36.8 C) (Oral)   Resp 19   Ht 6\' 3"  (1.905 m)   Wt 97 kg   SpO2 98%   BMI 26.73 kg/m   Chest b/l air entry Hss1s2 Abd soft CNS non focal Left foot dressing Labs    Latest Ref Rng & Units 03/01/2023    4:41 AM 02/27/2023    4:21 AM 02/26/2023    4:30 PM  CBC  WBC 4.0 - 10.5 K/uL 7.2  9.2  11.1   Hemoglobin 13.0 - 17.0 g/dL 16.1  9.9  09.6   Hematocrit 39.0 - 52.0 % 29.3  29.5  35.1   Platelets 150 - 400 K/uL 285  265  357        Latest Ref Rng & Units 03/01/2023    4:41 AM 02/27/2023    4:21 AM 02/26/2023    4:30 PM  CMP  Glucose 70 - 99 mg/dL 045  409  811   BUN 8 - 23 mg/dL 14  18  23    Creatinine 0.61 - 1.24 mg/dL 9.14  7.82  9.56   Sodium 135 - 145 mmol/L 132  131  132   Potassium 3.5 - 5.1 mmol/L 4.0  3.8  4.3   Chloride 98 - 111 mmol/L 101  101  97   CO2 22 - 32 mmol/L 24  23  23    Calcium 8.9 - 10.3 mg/dL 8.2  8.2  8.9   Total Protein 6.5 - 8.1 g/dL   8.4   Total Bilirubin 0.3 - 1.2 mg/dL   0.8   Alkaline Phos 38 - 126 U/L   64   AST 15 - 41 U/L   27   ALT 0 - 44 U/L   28     Micro- BC MSSA from 10/28 10/31 BC pending Wound culture from OR pending Bed side wound culture MSSA and strep mitis  Impression/recommendation  MSSA bacteremia secondary to left foot infection with ulcer Patient underwent 2D echo and there is a question of a degenerative versus vegetation on the aortic valve TEE negative for any endocaritis Patient is currently on cefazolin Repeat blood culture sent Pt does not want to take IV antibiotic at home on discharge- He wants oral antibiotic Await repeat blood culture and WC to finalize Will continue IV cefazolin atleast for a week until 03/05/23 and then may be able to switch PO diclox 1 gram PO Q6( depending on culture result) for 3 more weeks after that. Pt knows this is not the best regimen for MSSA bacteremia and  IV cefazolin is, and has made the decision to take oral antibiotic. HE does not want to entertain daily Iv antibiotic at days surgery also   Diabetes mellitus with peripheral neuropathy and diabetic foot infection Left TMA Ulcer on the plantar surface Osteomyelitis Underwent ffirst metatarsal resection and delayed primary closure Also underwent full thickness excisional debridement left medial arch Await pathology and culture to finalize  CAD status post CABG  A-fib on Eliquis and metoprolol   Previous infection of the left foot after thermal injury leading to TMA and Enterobacter and Enterococcus infection and he got 4 weeks of IV meropenem followed by 2 weeks of oral antibiotic earlier this year  Discussed the management with the patient and the care team ID will follow him peripherally this weekend On call ID available by  phone

## 2023-03-01 NOTE — Anesthesia Preprocedure Evaluation (Signed)
Anesthesia Evaluation  Patient identified by MRN, date of birth, ID band Patient awake    Reviewed: Allergy & Precautions, NPO status , Patient's Chart, lab work & pertinent test results  Airway Mallampati: II  TM Distance: >3 FB Neck ROM: full    Dental  (+) Teeth Intact   Pulmonary neg pulmonary ROS, former smoker   Pulmonary exam normal breath sounds clear to auscultation       Cardiovascular Exercise Tolerance: Good Pt. on medications + CAD and + CABG  negative cardio ROS Normal cardiovascular exam Rhythm:Regular Rate:Normal     Neuro/Psych negative neurological ROS  negative psych ROS   GI/Hepatic negative GI ROS, Neg liver ROS,,,  Endo/Other  negative endocrine ROSdiabetes, Type 1, Insulin Dependent    Renal/GU Renal InsufficiencyRenal diseasenegative Renal ROS  negative genitourinary   Musculoskeletal negative musculoskeletal ROS (+)    Abdominal Normal abdominal exam  (+)   Peds negative pediatric ROS (+)  Hematology negative hematology ROS (+)   Anesthesia Other Findings Past Medical History: No date: Bladder neck obstruction No date: Chronic kidney disease No date: Coronary artery disease     Comment:  a.) s/p 4v CABG in 2014 No date: Diabetes mellitus without complication (HCC) No date: Diabetic neuropathy (HCC) No date: Diabetic peripheral neuropathy (HCC) No date: Diverticulosis No date: Gout No date: Heart murmur No date: Hypercholesteremia No date: Hyperlipidemia No date: Hypertension No date: Peripheral neuropathy 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 No date: Tubular adenoma No date: Vitamin D deficiency  Past Surgical History: 08/19/2022: AMPUTATION; Left     Comment:  Procedure: TRANSMETATARSAL AMPUTATION LEFT FOOT WITH               IRRIGATION AND DEBRIDEMENT;  Surgeon: Rosetta Posner, DPM;              Location: [facility];  Service: Podiatry;  Laterality:               Left; 06/01/2015:  AMPUTATION TOE; Right     Comment:  Procedure: AMPUTATION TOE;  Surgeon: Recardo Evangelist,               DPM;  Location: [facility];  Service: Podiatry;                Laterality: Right; 08/11/2022: AMPUTATION TOE; Left     Comment:  Procedure: AMPUTATION TOE 2, 3, 4;  Surgeon: [name], DPM;  Location: [facility];  Service: Podiatry;                Laterality: Left; No date: CATARACT EXTRACTION, BILATERAL 06/12/2022: CIRCUMCISION; N/A     Comment:  Procedure: CIRCUMCISION ADULT;  Surgeon: Vanna Scotland, MD;  Location: [facility];  Service: Urology;                Laterality: N/A; 02/14/2016: COLONOSCOPY WITH PROPOFOL; N/A     Comment:  Procedure: COLONOSCOPY WITH PROPOFOL;  Surgeon: Christena Deem, MD;  Location: [facility];  Service:               Endoscopy;  Laterality: N/A; 01/07/2019: COLONOSCOPY WITH PROPOFOL; N/A     Comment:  Procedure: COLONOSCOPY WITH PROPOFOL;  Surgeon:  Christena Deem, MD;  Location: [facility] ENDOSCOPY;                Service: Endoscopy;  Laterality: N/A; 08/2012: CORONARY ARTERY BYPASS GRAFT; N/A 06/01/2015: EXCISION PARTIAL PHALANX; Right     Comment:  Procedure: EXCISION PARTIAL PHALANX /  BONE;  Surgeon:               Recardo Evangelist, DPM;  Location: [facility];  Service:               Podiatry;  Laterality: Right; 05/29/2016: FLEXIBLE SIGMOIDOSCOPY; N/A     Comment:  Procedure: FLEXIBLE SIGMOIDOSCOPY;  Surgeon: Christena Deem, MD;  Location: [facility];  Service:               Endoscopy;  Laterality: N/A; 08/23/2022: INCISION AND DRAINAGE; Left     Comment:  Procedure: INCISION AND DRAINAGE;  Surgeon: [name], DPM;  Location: [facility];  Service: Podiatry;                Laterality: Left; 09/15/2022: INCISION AND DRAINAGE OF WOUND; Left     Comment:  Procedure: 11044 - DEBRIDE BONE and EXCISION IF 1ST               METATARSAL BONE WITH  DELAY PRIMARY  CLOSURE;  Surgeon:               [name], DPM;  Location: [facility];  Service:               Podiatry;  Laterality: Left; 02/28/2023: IRRIGATION AND DEBRIDEMENT FOOT; Left     Comment:  Procedure: IRRIGATION AND DEBRIDEMENT FOOT;  Surgeon:               [name], DPM;  Location: [facility];  Service:               Orthopedics/Podiatry;  Laterality: Left; No date: KNEE ARTHROSCOPY; Left 08/22/2022: LOWER EXTREMITY ANGIOGRAPHY; Left     Comment:  Procedure: Lower Extremity Angiography;  Surgeon:               Renford Dills, MD;  Location: [facility];               Service: Cardiovascular;  Laterality: Left; 09/15/2022: WOUND DEBRIDEMENT; Left     Comment:  Procedure: 11043 - DEBRIDE SKIN. MUSCLE FASCIA;                Surgeon: [name], DPM;  Location: [facility];                Service: Podiatry;  Laterality: Left;  BMI    Body Mass Index: 26.73 kg/m      Reproductive/Obstetrics negative OB ROS                             Anesthesia Physical Anesthesia Plan  ASA: 3  Anesthesia Plan: General   Post-op Pain Management:    Induction: Intravenous  PONV Risk Score and Plan: Propofol infusion and TIVA  Airway Management Planned: Natural Airway and Nasal Cannula  Additional Equipment:   Intra-op Plan:   Post-operative Plan:   Informed Consent: I have reviewed the patients History and Physical, chart, labs and discussed the procedure including the  risks, benefits and alternatives for the proposed anesthesia with the patient or authorized representative who has indicated his/her understanding and acceptance.     Dental Advisory Given  Plan Discussed with: CRNA and Surgeon  Anesthesia Plan Comments:        Anesthesia Quick Evaluation

## 2023-03-01 NOTE — Transfer of Care (Signed)
Immediate Anesthesia Transfer of Care Note  [name]  Procedure(s) Performed: TRANSESOPHAGEAL ECHOCARDIOGRAM  Patient Location: PACU  Anesthesia Type:General  Level of Consciousness: awake, alert , and oriented  Airway & Oxygen Therapy: Patient connected to nasal cannula oxygen  Post-op Assessment: Report given to RN and Post -op Vital signs reviewed and stable  Post vital signs: Reviewed and stable  Last Vitals:  Vitals Value Taken Time  BP 96/73 03/01/23 1307  Temp    Pulse 80 03/01/23 1306  Resp 22 03/01/23 1307  SpO2 98 % 03/01/23 1307  Vitals shown include unfiled device data.  Last Pain:  Vitals:   03/01/23 1137  TempSrc: Oral  PainSc: 0-No pain         Complications: No notable events documented.

## 2023-03-01 NOTE — Progress Notes (Signed)
Daily Progress Note   Subjective  - Day of Surgery  Patient seen at bedside prior to echo.  No complaints of pain to surgical site.  Objective Vitals:   03/01/23 1315 03/01/23 1330 03/01/23 1345 03/01/23 1946  BP: (!) 88/51 128/76 118/85 120/75  Pulse: 71 68 75 81  Resp: 11 16 19 16   Temp:    98.2 F (36.8 C)  TempSrc:      SpO2: 97% 98% 98% 97%  Weight:      Height:        Physical Exam: Dressing change.  No purulent drainage from wound.  Erythema is stable.  Culture still pending from surgery.  Laboratory CBC    Component Value Date/Time   WBC 7.2 03/01/2023 0441   HGB 10.0 (L) 03/01/2023 0441   HCT 29.3 (L) 03/01/2023 0441   PLT 285 03/01/2023 0441    BMET    Component Value Date/Time   NA 132 (L) 03/01/2023 0441   K 4.0 03/01/2023 0441   CL 101 03/01/2023 0441   CO2 24 03/01/2023 0441   GLUCOSE 265 (H) 03/01/2023 0441   GLUCOSE 402 (H) 09/17/2012 1417   BUN 14 03/01/2023 0441   CREATININE 1.28 (H) 03/01/2023 0441   CALCIUM 8.2 (L) 03/01/2023 0441   GFRNONAA 59 (L) 03/01/2023 0441   GFRAA 58 (L) 06/02/2015 0656    Assessment/Planning: Diabetic foot infection left foot status post transmetatarsal amputation  New dressing applied. Appreciate infectious disease recommendations. Patient to attempt minimal weightbearing to foot.  Recommend nonweightbearing if able to stay off foot. Will follow while in house.  [name] A  03/01/2023, 8:16 PM

## 2023-03-01 NOTE — Progress Notes (Signed)
oriented X 3. Psych: Answers questions appropriately.   Labs: Basic Metabolic Panel: Recent Labs    02/27/23 0421 03/01/23 0441  NA 131* 132*  K 3.8 4.0  CL 101 101  CO2 23 24  GLUCOSE 177* 265*  BUN 18 14  CREATININE 1.39* 1.28*  CALCIUM 8.2* 8.2*   Liver Function Tests: Recent Labs    02/26/23 1630  AST 27  ALT 28  ALKPHOS 64  BILITOT 0.8  PROT 8.4*  ALBUMIN 3.7   No results for input(s): "LIPASE", "AMYLASE" in the last 72 hours. CBC: Recent Labs    02/26/23 1630 02/27/23 0421 03/01/23 0441  WBC 11.1* 9.2 7.2  NEUTROABS 10.1*  --   --   HGB 11.4* 9.9* 10.0*  HCT 35.1* 29.5* 29.3*  MCV 89.8 88.9 85.7  PLT 357 265 285   Cardiac Enzymes: No results for input(s): "CKTOTAL", "CKMB", "CKMBINDEX", "TROPONINIHS" in the last 72 hours. BNP: No results for input(s): "BNP" in the last 72 hours. D-Dimer: No results for input(s): "DDIMER" in the last 72 hours. Hemoglobin A1C: Recent Labs    02/26/23 1630  HGBA1C 7.5*   Fasting Lipid Panel: No results for input(s): "CHOL", "HDL", "LDLCALC", "TRIG", "CHOLHDL", "LDLDIRECT" in the last 72 hours. Thyroid Function Tests: No results for input(s): "TSH", "T4TOTAL", "T3FREE", "THYROIDAB" in the last 72 hours.  Invalid input(s): "FREET3" Anemia Panel: No results for input(s): "VITAMINB12", "FOLATE", "FERRITIN", "TIBC", "IRON", "RETICCTPCT" in the last 72 hours.   Radiology: ECHOCARDIOGRAM COMPLETE  Result Date: 02/27/2023    ECHOCARDIOGRAM REPORT   Patient Name:   Andrew Harding Date of Exam: 02/27/2023 Medical Rec #:  [id number]   Height:       75.0 in Accession #:    [id number]  Weight:       213.8 lb Date of Birth:  [birth year]    BSA:          2.258 m Patient Age:    72 years    BP:           122/85 mmHg Patient Gender: M           HR:           86 bpm. Exam  Location:  [facility] Procedure: 2D Echo, Cardiac Doppler and Color Doppler Indications:     Bacteremia  History:         Patient has prior history of Echocardiogram examinations, most                  recent 08/21/2022. CAD, Prior CABG, Arrythmias:Atrial                  Fibrillation, Signs/Symptoms:Bacteremia and                  Dizziness/Lightheadedness; Risk Factors:Hypertension, Diabetes                  and Dyslipidemia. CKD.  Sonographer:     Mikki Harbor Referring Phys:  [id number] MWNUUVOZ AGBATA Diagnosing Phys: Windell [place] IMPRESSIONS  1. Left ventricular ejection fraction, by estimation, is 55 to 60%. The left ventricle has normal function. Left ventricular endocardial border not optimally defined to evaluate regional wall motion. There is mild left ventricular hypertrophy. Left ventricular diastolic parameters are indeterminate.  2. Right ventricular systolic function is mildly reduced. The right ventricular size is mildly enlarged. There is normal pulmonary artery systolic pressure.  3. The mitral valve is normal in structure. Mild mitral valve regurgitation.  4. Small  oriented X 3. Psych: Answers questions appropriately.   Labs: Basic Metabolic Panel: Recent Labs    02/27/23 0421 03/01/23 0441  NA 131* 132*  K 3.8 4.0  CL 101 101  CO2 23 24  GLUCOSE 177* 265*  BUN 18 14  CREATININE 1.39* 1.28*  CALCIUM 8.2* 8.2*   Liver Function Tests: Recent Labs    02/26/23 1630  AST 27  ALT 28  ALKPHOS 64  BILITOT 0.8  PROT 8.4*  ALBUMIN 3.7   No results for input(s): "LIPASE", "AMYLASE" in the last 72 hours. CBC: Recent Labs    02/26/23 1630 02/27/23 0421 03/01/23 0441  WBC 11.1* 9.2 7.2  NEUTROABS 10.1*  --   --   HGB 11.4* 9.9* 10.0*  HCT 35.1* 29.5* 29.3*  MCV 89.8 88.9 85.7  PLT 357 265 285   Cardiac Enzymes: No results for input(s): "CKTOTAL", "CKMB", "CKMBINDEX", "TROPONINIHS" in the last 72 hours. BNP: No results for input(s): "BNP" in the last 72 hours. D-Dimer: No results for input(s): "DDIMER" in the last 72 hours. Hemoglobin A1C: Recent Labs    02/26/23 1630  HGBA1C 7.5*   Fasting Lipid Panel: No results for input(s): "CHOL", "HDL", "LDLCALC", "TRIG", "CHOLHDL", "LDLDIRECT" in the last 72 hours. Thyroid Function Tests: No results for input(s): "TSH", "T4TOTAL", "T3FREE", "THYROIDAB" in the last 72 hours.  Invalid input(s): "FREET3" Anemia Panel: No results for input(s): "VITAMINB12", "FOLATE", "FERRITIN", "TIBC", "IRON", "RETICCTPCT" in the last 72 hours.   Radiology: ECHOCARDIOGRAM COMPLETE  Result Date: 02/27/2023    ECHOCARDIOGRAM REPORT   Patient Name:   Andrew Harding Date of Exam: 02/27/2023 Medical Rec #:  [id number]   Height:       75.0 in Accession #:    [id number]  Weight:       213.8 lb Date of Birth:  [birth year]    BSA:          2.258 m Patient Age:    72 years    BP:           122/85 mmHg Patient Gender: M           HR:           86 bpm. Exam  Location:  [facility] Procedure: 2D Echo, Cardiac Doppler and Color Doppler Indications:     Bacteremia  History:         Patient has prior history of Echocardiogram examinations, most                  recent 08/21/2022. CAD, Prior CABG, Arrythmias:Atrial                  Fibrillation, Signs/Symptoms:Bacteremia and                  Dizziness/Lightheadedness; Risk Factors:Hypertension, Diabetes                  and Dyslipidemia. CKD.  Sonographer:     Mikki Harbor Referring Phys:  [id number] MWNUUVOZ AGBATA Diagnosing Phys: Windell [place] IMPRESSIONS  1. Left ventricular ejection fraction, by estimation, is 55 to 60%. The left ventricle has normal function. Left ventricular endocardial border not optimally defined to evaluate regional wall motion. There is mild left ventricular hypertrophy. Left ventricular diastolic parameters are indeterminate.  2. Right ventricular systolic function is mildly reduced. The right ventricular size is mildly enlarged. There is normal pulmonary artery systolic pressure.  3. The mitral valve is normal in structure. Mild mitral valve regurgitation.  4. Small  oriented X 3. Psych: Answers questions appropriately.   Labs: Basic Metabolic Panel: Recent Labs    02/27/23 0421 03/01/23 0441  NA 131* 132*  K 3.8 4.0  CL 101 101  CO2 23 24  GLUCOSE 177* 265*  BUN 18 14  CREATININE 1.39* 1.28*  CALCIUM 8.2* 8.2*   Liver Function Tests: Recent Labs    02/26/23 1630  AST 27  ALT 28  ALKPHOS 64  BILITOT 0.8  PROT 8.4*  ALBUMIN 3.7   No results for input(s): "LIPASE", "AMYLASE" in the last 72 hours. CBC: Recent Labs    02/26/23 1630 02/27/23 0421 03/01/23 0441  WBC 11.1* 9.2 7.2  NEUTROABS 10.1*  --   --   HGB 11.4* 9.9* 10.0*  HCT 35.1* 29.5* 29.3*  MCV 89.8 88.9 85.7  PLT 357 265 285   Cardiac Enzymes: No results for input(s): "CKTOTAL", "CKMB", "CKMBINDEX", "TROPONINIHS" in the last 72 hours. BNP: No results for input(s): "BNP" in the last 72 hours. D-Dimer: No results for input(s): "DDIMER" in the last 72 hours. Hemoglobin A1C: Recent Labs    02/26/23 1630  HGBA1C 7.5*   Fasting Lipid Panel: No results for input(s): "CHOL", "HDL", "LDLCALC", "TRIG", "CHOLHDL", "LDLDIRECT" in the last 72 hours. Thyroid Function Tests: No results for input(s): "TSH", "T4TOTAL", "T3FREE", "THYROIDAB" in the last 72 hours.  Invalid input(s): "FREET3" Anemia Panel: No results for input(s): "VITAMINB12", "FOLATE", "FERRITIN", "TIBC", "IRON", "RETICCTPCT" in the last 72 hours.   Radiology: ECHOCARDIOGRAM COMPLETE  Result Date: 02/27/2023    ECHOCARDIOGRAM REPORT   Patient Name:   Andrew Harding Date of Exam: 02/27/2023 Medical Rec #:  [id number]   Height:       75.0 in Accession #:    [id number]  Weight:       213.8 lb Date of Birth:  [birth year]    BSA:          2.258 m Patient Age:    72 years    BP:           122/85 mmHg Patient Gender: M           HR:           86 bpm. Exam  Location:  [facility] Procedure: 2D Echo, Cardiac Doppler and Color Doppler Indications:     Bacteremia  History:         Patient has prior history of Echocardiogram examinations, most                  recent 08/21/2022. CAD, Prior CABG, Arrythmias:Atrial                  Fibrillation, Signs/Symptoms:Bacteremia and                  Dizziness/Lightheadedness; Risk Factors:Hypertension, Diabetes                  and Dyslipidemia. CKD.  Sonographer:     Mikki Harbor Referring Phys:  [id number] MWNUUVOZ AGBATA Diagnosing Phys: Windell [place] IMPRESSIONS  1. Left ventricular ejection fraction, by estimation, is 55 to 60%. The left ventricle has normal function. Left ventricular endocardial border not optimally defined to evaluate regional wall motion. There is mild left ventricular hypertrophy. Left ventricular diastolic parameters are indeterminate.  2. Right ventricular systolic function is mildly reduced. The right ventricular size is mildly enlarged. There is normal pulmonary artery systolic pressure.  3. The mitral valve is normal in structure. Mild mitral valve regurgitation.  4. Small  oriented X 3. Psych: Answers questions appropriately.   Labs: Basic Metabolic Panel: Recent Labs    02/27/23 0421 03/01/23 0441  NA 131* 132*  K 3.8 4.0  CL 101 101  CO2 23 24  GLUCOSE 177* 265*  BUN 18 14  CREATININE 1.39* 1.28*  CALCIUM 8.2* 8.2*   Liver Function Tests: Recent Labs    02/26/23 1630  AST 27  ALT 28  ALKPHOS 64  BILITOT 0.8  PROT 8.4*  ALBUMIN 3.7   No results for input(s): "LIPASE", "AMYLASE" in the last 72 hours. CBC: Recent Labs    02/26/23 1630 02/27/23 0421 03/01/23 0441  WBC 11.1* 9.2 7.2  NEUTROABS 10.1*  --   --   HGB 11.4* 9.9* 10.0*  HCT 35.1* 29.5* 29.3*  MCV 89.8 88.9 85.7  PLT 357 265 285   Cardiac Enzymes: No results for input(s): "CKTOTAL", "CKMB", "CKMBINDEX", "TROPONINIHS" in the last 72 hours. BNP: No results for input(s): "BNP" in the last 72 hours. D-Dimer: No results for input(s): "DDIMER" in the last 72 hours. Hemoglobin A1C: Recent Labs    02/26/23 1630  HGBA1C 7.5*   Fasting Lipid Panel: No results for input(s): "CHOL", "HDL", "LDLCALC", "TRIG", "CHOLHDL", "LDLDIRECT" in the last 72 hours. Thyroid Function Tests: No results for input(s): "TSH", "T4TOTAL", "T3FREE", "THYROIDAB" in the last 72 hours.  Invalid input(s): "FREET3" Anemia Panel: No results for input(s): "VITAMINB12", "FOLATE", "FERRITIN", "TIBC", "IRON", "RETICCTPCT" in the last 72 hours.   Radiology: ECHOCARDIOGRAM COMPLETE  Result Date: 02/27/2023    ECHOCARDIOGRAM REPORT   Patient Name:   Andrew Harding Date of Exam: 02/27/2023 Medical Rec #:  [id number]   Height:       75.0 in Accession #:    [id number]  Weight:       213.8 lb Date of Birth:  [birth year]    BSA:          2.258 m Patient Age:    72 years    BP:           122/85 mmHg Patient Gender: M           HR:           86 bpm. Exam  Location:  [facility] Procedure: 2D Echo, Cardiac Doppler and Color Doppler Indications:     Bacteremia  History:         Patient has prior history of Echocardiogram examinations, most                  recent 08/21/2022. CAD, Prior CABG, Arrythmias:Atrial                  Fibrillation, Signs/Symptoms:Bacteremia and                  Dizziness/Lightheadedness; Risk Factors:Hypertension, Diabetes                  and Dyslipidemia. CKD.  Sonographer:     Mikki Harbor Referring Phys:  [id number] MWNUUVOZ AGBATA Diagnosing Phys: Windell [place] IMPRESSIONS  1. Left ventricular ejection fraction, by estimation, is 55 to 60%. The left ventricle has normal function. Left ventricular endocardial border not optimally defined to evaluate regional wall motion. There is mild left ventricular hypertrophy. Left ventricular diastolic parameters are indeterminate.  2. Right ventricular systolic function is mildly reduced. The right ventricular size is mildly enlarged. There is normal pulmonary artery systolic pressure.  3. The mitral valve is normal in structure. Mild mitral valve regurgitation.  4. Small  [facility] CLINIC CARDIOLOGY PROGRESS NOTE       Patient ID: Andrew Harding MRN: [id number] DOB/AGE: [birth year] 72 y.o.  Admit date: 02/26/2023 Referring Physician Dr. Esaw Grandchild Primary Physician Lynnea Ferrier, MD  Primary Cardiologist Dr. Gwen Pounds (last seen 2019) Reason for Consultation MSSA bacteremia  HPI: JAMEN ABBONDANZA is a 72 y.o. male  with a past medical history of CAD s/p CABG x 4 (2014, [facility]), atrial fibrillation on Eliquis, hypertension, PAD s/p transmetatarsal amputation L foot, DM2 who presented to the ED on 02/26/2023 shortness of breath and chills.  Patient currently being treated for sepsis, MSSA bacteremia, inpatient echo with concern for possible mobile echodensity on aortic valve.  Cardiology was consulted for further evaluation.   Interval history: -Patient reports he is feeling ok this AM, did not sleep well.  -Underwent I&D of foot wound yesterday afternoon with podiatry.  -Denies any CP, SOB, palpitations. Remains in rate controlled atrial fibrillation.  -Plan for TEE this afternoon.  Review of systems complete and found to be negative unless listed above    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   Tubular adenoma    Vitamin D deficiency     Past Surgical History:  Procedure Laterality Date   AMPUTATION Left 08/19/2022   Procedure: TRANSMETATARSAL AMPUTATION LEFT FOOT WITH IRRIGATION AND DEBRIDEMENT;  Surgeon: Rosetta Posner, DPM;  Location: [facility] ORS;  Service: Podiatry;  Laterality: Left;   AMPUTATION TOE Right 06/01/2015   Procedure: AMPUTATION TOE;  Surgeon: Recardo Evangelist, DPM;   Location: [facility] ORS;  Service: Podiatry;  Laterality: Right;   AMPUTATION TOE Left 08/11/2022   Procedure: AMPUTATION TOE 2, 3, 4;  Surgeon: [name], DPM;  Location: [facility] ORS;  Service: Podiatry;  Laterality: Left;   CATARACT EXTRACTION, BILATERAL     CIRCUMCISION N/A 06/12/2022   Procedure: CIRCUMCISION ADULT;  Surgeon: Vanna Scotland, MD;  Location: [facility] ORS;  Service: Urology;  Laterality: N/A;   COLONOSCOPY WITH PROPOFOL N/A 02/14/2016   Procedure: COLONOSCOPY WITH PROPOFOL;  Surgeon: Christena Deem, MD;  Location: [facility] ENDOSCOPY;  Service: Endoscopy;  Laterality: N/A;   COLONOSCOPY WITH PROPOFOL N/A 01/07/2019   Procedure: COLONOSCOPY WITH PROPOFOL;  Surgeon: Christena Deem, MD;  Location: [facility] ENDOSCOPY;  Service: Endoscopy;  Laterality: N/A;   CORONARY ARTERY BYPASS GRAFT N/A 08/2012   EXCISION PARTIAL PHALANX Right 06/01/2015   Procedure: EXCISION PARTIAL PHALANX /  BONE;  Surgeon: Recardo Evangelist, DPM;  Location: [facility] ORS;  Service: Podiatry;  Laterality: Right;   FLEXIBLE SIGMOIDOSCOPY N/A 05/29/2016   Procedure: FLEXIBLE SIGMOIDOSCOPY;  Surgeon: Christena Deem, MD;  Location: [facility] ENDOSCOPY;  Service: Endoscopy;  Laterality: N/A;   INCISION AND DRAINAGE Left 08/23/2022   Procedure: INCISION AND DRAINAGE;  Surgeon: [name], DPM;  Location: [facility] ORS;  Service: Podiatry;  Laterality: Left;   INCISION AND DRAINAGE OF WOUND Left 09/15/2022   Procedure: 11044 - DEBRIDE BONE and EXCISION IF 1ST METATARSAL BONE WITH  DELAY PRIMARY CLOSURE;  Surgeon: [name], DPM;  oriented X 3. Psych: Answers questions appropriately.   Labs: Basic Metabolic Panel: Recent Labs    02/27/23 0421 03/01/23 0441  NA 131* 132*  K 3.8 4.0  CL 101 101  CO2 23 24  GLUCOSE 177* 265*  BUN 18 14  CREATININE 1.39* 1.28*  CALCIUM 8.2* 8.2*   Liver Function Tests: Recent Labs    02/26/23 1630  AST 27  ALT 28  ALKPHOS 64  BILITOT 0.8  PROT 8.4*  ALBUMIN 3.7   No results for input(s): "LIPASE", "AMYLASE" in the last 72 hours. CBC: Recent Labs    02/26/23 1630 02/27/23 0421 03/01/23 0441  WBC 11.1* 9.2 7.2  NEUTROABS 10.1*  --   --   HGB 11.4* 9.9* 10.0*  HCT 35.1* 29.5* 29.3*  MCV 89.8 88.9 85.7  PLT 357 265 285   Cardiac Enzymes: No results for input(s): "CKTOTAL", "CKMB", "CKMBINDEX", "TROPONINIHS" in the last 72 hours. BNP: No results for input(s): "BNP" in the last 72 hours. D-Dimer: No results for input(s): "DDIMER" in the last 72 hours. Hemoglobin A1C: Recent Labs    02/26/23 1630  HGBA1C 7.5*   Fasting Lipid Panel: No results for input(s): "CHOL", "HDL", "LDLCALC", "TRIG", "CHOLHDL", "LDLDIRECT" in the last 72 hours. Thyroid Function Tests: No results for input(s): "TSH", "T4TOTAL", "T3FREE", "THYROIDAB" in the last 72 hours.  Invalid input(s): "FREET3" Anemia Panel: No results for input(s): "VITAMINB12", "FOLATE", "FERRITIN", "TIBC", "IRON", "RETICCTPCT" in the last 72 hours.   Radiology: ECHOCARDIOGRAM COMPLETE  Result Date: 02/27/2023    ECHOCARDIOGRAM REPORT   Patient Name:   Andrew Harding Date of Exam: 02/27/2023 Medical Rec #:  [id number]   Height:       75.0 in Accession #:    [id number]  Weight:       213.8 lb Date of Birth:  [birth year]    BSA:          2.258 m Patient Age:    72 years    BP:           122/85 mmHg Patient Gender: M           HR:           86 bpm. Exam  Location:  [facility] Procedure: 2D Echo, Cardiac Doppler and Color Doppler Indications:     Bacteremia  History:         Patient has prior history of Echocardiogram examinations, most                  recent 08/21/2022. CAD, Prior CABG, Arrythmias:Atrial                  Fibrillation, Signs/Symptoms:Bacteremia and                  Dizziness/Lightheadedness; Risk Factors:Hypertension, Diabetes                  and Dyslipidemia. CKD.  Sonographer:     Mikki Harbor Referring Phys:  [id number] MWNUUVOZ AGBATA Diagnosing Phys: Windell [place] IMPRESSIONS  1. Left ventricular ejection fraction, by estimation, is 55 to 60%. The left ventricle has normal function. Left ventricular endocardial border not optimally defined to evaluate regional wall motion. There is mild left ventricular hypertrophy. Left ventricular diastolic parameters are indeterminate.  2. Right ventricular systolic function is mildly reduced. The right ventricular size is mildly enlarged. There is normal pulmonary artery systolic pressure.  3. The mitral valve is normal in structure. Mild mitral valve regurgitation.  4. Small  oriented X 3. Psych: Answers questions appropriately.   Labs: Basic Metabolic Panel: Recent Labs    02/27/23 0421 03/01/23 0441  NA 131* 132*  K 3.8 4.0  CL 101 101  CO2 23 24  GLUCOSE 177* 265*  BUN 18 14  CREATININE 1.39* 1.28*  CALCIUM 8.2* 8.2*   Liver Function Tests: Recent Labs    02/26/23 1630  AST 27  ALT 28  ALKPHOS 64  BILITOT 0.8  PROT 8.4*  ALBUMIN 3.7   No results for input(s): "LIPASE", "AMYLASE" in the last 72 hours. CBC: Recent Labs    02/26/23 1630 02/27/23 0421 03/01/23 0441  WBC 11.1* 9.2 7.2  NEUTROABS 10.1*  --   --   HGB 11.4* 9.9* 10.0*  HCT 35.1* 29.5* 29.3*  MCV 89.8 88.9 85.7  PLT 357 265 285   Cardiac Enzymes: No results for input(s): "CKTOTAL", "CKMB", "CKMBINDEX", "TROPONINIHS" in the last 72 hours. BNP: No results for input(s): "BNP" in the last 72 hours. D-Dimer: No results for input(s): "DDIMER" in the last 72 hours. Hemoglobin A1C: Recent Labs    02/26/23 1630  HGBA1C 7.5*   Fasting Lipid Panel: No results for input(s): "CHOL", "HDL", "LDLCALC", "TRIG", "CHOLHDL", "LDLDIRECT" in the last 72 hours. Thyroid Function Tests: No results for input(s): "TSH", "T4TOTAL", "T3FREE", "THYROIDAB" in the last 72 hours.  Invalid input(s): "FREET3" Anemia Panel: No results for input(s): "VITAMINB12", "FOLATE", "FERRITIN", "TIBC", "IRON", "RETICCTPCT" in the last 72 hours.   Radiology: ECHOCARDIOGRAM COMPLETE  Result Date: 02/27/2023    ECHOCARDIOGRAM REPORT   Patient Name:   Andrew Harding Date of Exam: 02/27/2023 Medical Rec #:  [id number]   Height:       75.0 in Accession #:    [id number]  Weight:       213.8 lb Date of Birth:  [birth year]    BSA:          2.258 m Patient Age:    72 years    BP:           122/85 mmHg Patient Gender: M           HR:           86 bpm. Exam  Location:  [facility] Procedure: 2D Echo, Cardiac Doppler and Color Doppler Indications:     Bacteremia  History:         Patient has prior history of Echocardiogram examinations, most                  recent 08/21/2022. CAD, Prior CABG, Arrythmias:Atrial                  Fibrillation, Signs/Symptoms:Bacteremia and                  Dizziness/Lightheadedness; Risk Factors:Hypertension, Diabetes                  and Dyslipidemia. CKD.  Sonographer:     Mikki Harbor Referring Phys:  [id number] MWNUUVOZ AGBATA Diagnosing Phys: Windell [place] IMPRESSIONS  1. Left ventricular ejection fraction, by estimation, is 55 to 60%. The left ventricle has normal function. Left ventricular endocardial border not optimally defined to evaluate regional wall motion. There is mild left ventricular hypertrophy. Left ventricular diastolic parameters are indeterminate.  2. Right ventricular systolic function is mildly reduced. The right ventricular size is mildly enlarged. There is normal pulmonary artery systolic pressure.  3. The mitral valve is normal in structure. Mild mitral valve regurgitation.  4. Small

## 2023-03-01 NOTE — Evaluation (Signed)
Physical Therapy Evaluation Patient Details Name: Andrew Harding MRN: [id number] DOB: 1950/08/21 Today's Date: 03/01/2023  History of Present Illness  72 y.o. male with medical history significant for insulin-dependent DM 2, A-fib on Eliquis, HTN, CAD status post CABG, CKD 3B, PAD s/p transmetatarsal amputation left foot who is being admitted with a foot wound infection with sepsis and A fib with RVR after presenting with a presyncopal episode.  Patient was last seen by podiatry 2 weeks ago on 02/12/2023 with concern for draining and bleeding from a wound at the bottom of his foot, now s/p I&D and 5th met excission, h/o transmet amp earlier this year.  Clinical Impression  Pt pleasant and motivated to work with PT, showed good confidence with basic mobility and overall did well.  He was able to maintain NWBing on L LE consistently, only occasional incidental heel touch down.  Pt was able to use UEs on walker well lifting and placing R foot rather than hopping.  Pt had dealt with similar situation earlier this year, reports good understanding of considerations at home.  Pt will benefit from continued PT to address functional limitations and insure safe transition home.       If plan is discharge home, recommend the following: A little help with bathing/dressing/bathroom;A little help with walking and/or transfers;Assistance with cooking/housework;Assist for transportation;Help with stairs or ramp for entrance   Can travel by private vehicle        Equipment Recommendations  (has a walker)  Recommendations for Other Services       Functional Status Assessment Patient has had a recent decline in their functional status and demonstrates the ability to make significant improvements in function in a reasonable and predictable amount of time.     Precautions / Restrictions Restrictions Weight Bearing Restrictions: Yes LLE Weight Bearing: Non weight bearing      Mobility  Bed Mobility Overal  bed mobility: Independent             General bed mobility comments: easily and confidently gets to sitting up EOB w/o assist    Transfers Overall transfer level: Independent Equipment used: Rolling walker (2 wheels)               General transfer comment: Minimal cuing for set up/hand placement, showed good R LE strength and confidence with transition to standing - able to maintain L NWBing w/o issue    Ambulation/Gait Ambulation/Gait assistance: Supervision Gait Distance (Feet): 50 Feet Assistive device: Rolling walker (2 wheels)         General Gait Details: Pt was able to maintain NWBing relatively well with good UE control and use, did have some fatigue (UEs) with increased distance - no LOBs  Stairs            Wheelchair Mobility     Tilt Bed    Modified Rankin (Stroke Patients Only)       Balance Overall balance assessment: Modified Independent                                           Pertinent Vitals/Pain Pain Assessment Pain Assessment: Faces Faces Pain Scale: Hurts a little bit Pain Location: L foot    Home Living Family/patient expects to be discharged to:: Private residence Living Arrangements: Alone Available Help at Discharge: Family;Friend(s);Available PRN/intermittently Type of Home: House Home Access: Ramped entrance (had ramp put  up after last hospitalization, has only small threshold now)       Home Layout: Able to live on main level with bedroom/bathroom Home Equipment: Cane - Programmer, applications (2 wheels)      Prior Function Prior Level of Function : Working/employed;Driving             Mobility Comments: typically active, is an [employer]. independent prior to recent surgery (afterwards was using quad cane for ambulation and had a fall at home) ADLs Comments: independent prior to surgery     Extremity/Trunk Assessment   Upper Extremity Assessment Upper Extremity Assessment:  Generalized weakness;Overall [facility] for tasks assessed    Lower Extremity Assessment Lower Extremity Assessment: Generalized weakness;Overall WFL for tasks assessed       Communication   Communication Communication: No apparent difficulties  Cognition Arousal: Alert Behavior During Therapy: WFL for tasks assessed/performed Overall Cognitive Status: Within Functional Limits for tasks assessed                                          General Comments      Exercises     Assessment/Plan    PT Assessment Patient needs continued PT services  PT Problem List Decreased strength;Decreased activity tolerance;Decreased balance;Decreased mobility;Decreased coordination;Decreased knowledge of use of DME;Decreased safety awareness;Decreased knowledge of precautions;Cardiopulmonary status limiting activity;Pain       PT Treatment Interventions DME instruction;Gait training;Stair training;Functional mobility training;Therapeutic activities;Therapeutic exercise;Neuromuscular re-education;Patient/family education;Balance training;Wheelchair mobility training    PT Goals (Current goals can be found in the Care Plan section)  Acute Rehab PT Goals Patient Stated Goal: go home PT Goal Formulation: With patient Time For Goal Achievement: 03/14/23 Potential to Achieve Goals: Good    Frequency Min 1X/week     Co-evaluation               AM-PAC PT "6 Clicks" Mobility  Outcome Measure Help needed turning from your back to your side while in a flat bed without using bedrails?: None Help needed moving from lying on your back to sitting on the side of a flat bed without using bedrails?: None Help needed moving to and from a bed to a chair (including a wheelchair)?: None Help needed standing up from a chair using your arms (e.g., wheelchair or bedside chair)?: None Help needed to walk in hospital room?: A Little Help needed climbing 3-5 steps with a railing? : A Little 6  Click Score: 22    End of Session Equipment Utilized During Treatment: Gait belt Activity Tolerance: Patient tolerated treatment well;Patient limited by fatigue Patient left: with chair alarm set;with call bell/phone within reach Nurse Communication: Mobility status PT Visit Diagnosis: Unsteadiness on feet (R26.81);Difficulty in walking, not elsewhere classified (R26.2);Muscle weakness (generalized) (M62.81)    Time: 9528-4132 PT Time Calculation (min) (ACUTE ONLY): 20 min   Charges:   PT Evaluation $PT Eval Low Complexity: 1 Low PT Treatments $Gait Training: 8-22 mins PT General Charges $$ ACUTE PT VISIT: 1 Visit         Malachi Pro, DPT 03/01/2023, 10:24 AM

## 2023-03-01 NOTE — Plan of Care (Signed)
  Problem: Fluid Volume: Goal: Hemodynamic stability will improve Outcome: Progressing   Problem: Clinical Measurements: Goal: Diagnostic test results will improve Outcome: Progressing   Problem: Respiratory: Goal: Ability to maintain adequate ventilation will improve Outcome: Progressing

## 2023-03-01 NOTE — TOC Progression Note (Signed)
Transition of Care [facility]) - Progression Note    Patient Details  Name: Andrew Harding MRN: [id number] Date of Birth: 1951-01-17  Transition of Care [facility]) CM/SW Contact  Truddie Hidden, RN Phone Number: 03/01/2023, 12:01 PM  Clinical Narrative:    Spoke with patient at bedside regarding therapy's recommendation for [facility]. Patient refused stating he did think he needed that. He was more concerned about going home with a PICC line for IVAB. Patient was advised infectious disease would follow up with him. He is requesting oral antibiotic at discharge. Attending and ID MD's notified.      Barriers to Discharge: Continued Medical Work up  Expected Discharge Plan and Services       Living arrangements for the past 2 months: Single Family Home                                       Social Determinants of Health (SDOH) Interventions SDOH Screenings   Food Insecurity: No Food Insecurity (02/27/2023)  Housing: Low Risk  (02/27/2023)  Transportation Needs: No Transportation Needs (02/27/2023)  Utilities: Not At Risk (02/27/2023)  Depression (PHQ2-9): Low Risk  (09/14/2022)  Financial Resource Strain: Low Risk  (12/13/2022)   Received from [facility]  Tobacco Use: Medium Risk (03/01/2023)    Readmission Risk Interventions    02/28/2023   12:53 PM  Readmission Risk Prevention Plan  Transportation Screening Complete  PCP or Specialist Appt within 3-5 Days Complete  HRI or Home Care Consult Complete  Social Work Consult for Recovery Care Planning/Counseling Complete  Palliative Care Screening Not Applicable  Medication Review [employer]) Complete

## 2023-03-01 NOTE — Plan of Care (Signed)
  Problem: Fluid Volume: Goal: Hemodynamic stability will improve Outcome: Progressing   Problem: Clinical Measurements: Goal: Diagnostic test results will improve Outcome: Progressing Goal: Signs and symptoms of infection will decrease Outcome: Progressing   Problem: Respiratory: Goal: Ability to maintain adequate ventilation will improve Outcome: Progressing   Problem: Education: Goal: Ability to describe self-care measures that may prevent or decrease complications (Diabetes Survival Skills Education) will improve Outcome: Progressing Goal: Individualized Educational Video(s) Outcome: Progressing   Problem: Coping: Goal: Ability to adjust to condition or change in health will improve Outcome: Progressing   Problem: Fluid Volume: Goal: Ability to maintain a balanced intake and output will improve Outcome: Progressing   Problem: Health Behavior/Discharge Planning: Goal: Ability to identify and utilize available resources and services will improve Outcome: Progressing Goal: Ability to manage health-related needs will improve Outcome: Progressing   Problem: Metabolic: Goal: Ability to maintain appropriate glucose levels will improve Outcome: Progressing   Problem: Nutritional: Goal: Maintenance of adequate nutrition will improve Outcome: Progressing Goal: Progress toward achieving an optimal weight will improve Outcome: Progressing   Problem: Skin Integrity: Goal: Risk for impaired skin integrity will decrease Outcome: Progressing   Problem: Tissue Perfusion: Goal: Adequacy of tissue perfusion will improve Outcome: Progressing   Problem: Education: Goal: Knowledge of General Education information will improve Description: Including pain rating scale, medication(s)/side effects and non-pharmacologic comfort measures Outcome: Progressing   Problem: Health Behavior/Discharge Planning: Goal: Ability to manage health-related needs will improve Outcome:  Progressing   Problem: Clinical Measurements: Goal: Ability to maintain clinical measurements within normal limits will improve Outcome: Progressing Goal: Will remain free from infection Outcome: Progressing Goal: Diagnostic test results will improve Outcome: Progressing Goal: Respiratory complications will improve Outcome: Progressing Goal: Cardiovascular complication will be avoided Outcome: Progressing   Problem: Activity: Goal: Risk for activity intolerance will decrease Outcome: Progressing   Problem: Nutrition: Goal: Adequate nutrition will be maintained Outcome: Progressing   Problem: Pain Management: Goal: General experience of comfort will improve Outcome: Progressing   Problem: Elimination: Goal: Will not experience complications related to bowel motility Outcome: Progressing Goal: Will not experience complications related to urinary retention Outcome: Progressing   Problem: Safety: Goal: Ability to remain free from injury will improve Outcome: Progressing   Problem: Skin Integrity: Goal: Risk for impaired skin integrity will decrease Outcome: Progressing

## 2023-03-01 NOTE — Progress Notes (Addendum)
Progress Note   [name] DOB: November 26, 1950 DOA: 02/26/2023     3 DOS: the patient was seen and examined on 03/01/2023   Brief hospital course: Andrew Harding is a 72 y.o. male with medical history significant for insulin-dependent DM 2, A-fib on Eliquis, HTN, CAD status post CABG, CKD 3B, PAD s/p transmetatarsal amputation left foot  who is being admitted with a foot wound infection with sepsis and A fib with RVR after presenting with a presyncopal episode.  Patient was last seen by podiatry 2 weeks ago on 02/12/2023 with concern for draining and bleeding from a wound at the bottom of his foot   He was prescribed Santyl dressings.  Since his visit however, he has noticed more redness to the area.  Additionally he has been feeling more fatigued and then today while at work, while going up some stairs he felt like he  became acutely short of breath and felt like he was going to pass out , thus prompting the visit to the ED. ED course and data review: On arrival tachycardic to up to 136 and tachypneic to 22 but afebrile and with SBP in the 130s to 150s. Labs: WBC 11,000 with lactic acid 3.2--1.7. Hemoglobin at baseline at 11.4 Creatinine 1.76 up from baseline of 1.13 about 6 months prior Blood glucose 255 EKG, personally viewed and interpreted showing A-fib at 124 with no ischemic changes. Foot x-ray showing possible osteomyelitis with recommendation for MRI as follows IMPRESSION: Transmetatarsal amputation of the left foot with soft tissue swelling and irregularity about the stump of the first metatarsal. Questionable loss of cortical distinctness about the stumps of the first and second metatarsals. Osteomyelitis is not excluded. Consider MRI further evaluation.   Chest x-ray nonacute   Patient started on vancomycin and Rocephin   Hospitalist consulted for admission.     Assessment and Plan:  Severe sepsis with acute organ dysfunction (AKI) Diabetic foot infection  with possible osteomyelitis, left  (HCC) PAD s/p left transmetatarsal amputation 07/2022 MSSA Bacteremia Sepsis criteria include tachycardia, tachypnea, leukocytosis and lactic acidosis with concerns for wound infection and possible osteomyelitis Blood cultures yielded MSSA Initially on Rocephin and vancomycin >> Ancef Patient serum creatinine on admission was 1.76 and has improved with IV fluid hydration Completed sepsis fluids. 2D echo - no vegetations seen Cardiology consulted for TEE - done 10/31 - no vegetations ID following --  Continue Ancef Follow cultures Podiatry following -- taking for surgical debridement this afternoon     * Postural dizziness with presyncope Secondary to sepsis and A-fib with RVR Improved Continuous cardiac monitoring and neurologic checks      Atrial fibrillation with RVR (HCC) Likely driven by sepsis Improved with IV fluids for treatment of sepsis and continuation of home metoprolol Continuous cardiac monitoring Eliquis on hold in anticipation of possible wound debridement Patient not on any heparin bridge due to active bleeding from his TMA stump    AKI (acute kidney injury) (HCC) creatinine 1.76 up from baseline of 1.13 about 6 months prior Likely prerenal, related to sepsis Renal function has improved with IV fluid hydration Monitor renal function and avoid nephrotoxins    Controlled type 2 diabetes mellitus with neuropathy (HCC) Continue basal insulin with sliding scale insulin coverage Continue gabapentin    CAD S/P CABG x 4 PAD s/p left transmetatarsal amputation Continue metoprolol and statin Plavix is on hold for possible procedure         Subjective: Patient awake resting in  bed this afternoon, seen after TEE.  He denies acute complaints.  States PICC line and IV antibiotics would be next to impossible for him to do, so very hopeful for an oral option.    Physical Exam: Vitals:   03/01/23 1310 03/01/23 1315 03/01/23  1330 03/01/23 1345  BP: (!) 98/59 (!) 88/51 128/76 118/85  Pulse: 69 71 68 75  Resp: 17 11 16 19   Temp:      TempSrc:      SpO2: 99% 97% 98% 98%  Weight:      Height:       General exam: awake, alert, no acute distress HEENT: atraumatic, clear conjunctiva, anicteric sclera, moist mucus membranes, hearing grossly normal  Respiratory system: on room air, normal respiratory effort. Cardiovascular system: RRR, no pedal edema.   Gastrointestinal system: soft, NT, ND Central nervous system: A&O x 3. no gross focal neurologic deficits, normal speech Extremities: left foot dressing clean dry intact,, no peripheral edema Skin: dry, intact, normal temperature Psychiatry: normal mood, congruent affect, judgement and insight appear normal   Data Reviewed: Notable labs ---  Na 132, glucose 265, Cr 1.28, Ca 8.2, Hbg 10.0  TEE - report pending but no vegetations seen per verbal report   Family Communication: Plan of care discussed with patient in detail.   Disposition: Status is: Inpatient Remains inpatient appropriate because: remains on empiric IV antibiotics pending cultures   Planned Discharge Destination:  TBD    Time spent: 35 minutes  Author: Pennie Banter, DO 03/01/2023 2:55 PM  For on call review [email or web address].

## 2023-03-02 ENCOUNTER — Other Ambulatory Visit: Payer: Self-pay

## 2023-03-02 ENCOUNTER — Other Ambulatory Visit (HOSPITAL_COMMUNITY): Payer: Self-pay

## 2023-03-02 ENCOUNTER — Encounter: Payer: Self-pay | Admitting: Cardiology

## 2023-03-02 DIAGNOSIS — R55 Syncope and collapse: Secondary | ICD-10-CM | POA: Diagnosis not present

## 2023-03-02 DIAGNOSIS — R42 Dizziness and giddiness: Secondary | ICD-10-CM | POA: Diagnosis not present

## 2023-03-02 LAB — GLUCOSE, CAPILLARY
Glucose-Capillary: 207 mg/dL — ABNORMAL HIGH (ref 70–99)
Glucose-Capillary: 244 mg/dL — ABNORMAL HIGH (ref 70–99)
Glucose-Capillary: 157 mg/dL — ABNORMAL HIGH (ref 70–99)
Glucose-Capillary: 248 mg/dL — ABNORMAL HIGH (ref 70–99)

## 2023-03-02 LAB — SURGICAL PATHOLOGY

## 2023-03-02 MED ORDER — INSULIN GLARGINE-YFGN 100 UNIT/ML ~~LOC~~ SOLN
28.0000 [IU] | Freq: Every day | SUBCUTANEOUS | Status: DC
  Administered 2023-03-02 – 2023-03-03 (×2): 28 [IU] via SUBCUTANEOUS
  Filled 2023-03-02 (×2): qty 0.28

## 2023-03-02 NOTE — Plan of Care (Signed)

## 2023-03-02 NOTE — Progress Notes (Signed)
Progress Note   [name] TDD:[id number] DOB: 04/06/1951 DOA: 02/26/2023     4 DOS: the patient was seen and examined on 03/02/2023   Brief hospital course: Andrew Harding is a 72 y.o. male with medical history significant for insulin-dependent DM 2, A-fib on Eliquis, HTN, CAD status post CABG, CKD 3B, PAD s/p transmetatarsal amputation left foot  who is being admitted with a foot wound infection with sepsis and A fib with RVR after presenting with a presyncopal episode.  Patient was last seen by podiatry 2 weeks ago on 02/12/2023 with concern for draining and bleeding from a wound at the bottom of his foot   He was prescribed Santyl dressings.  Since his visit however, he has noticed more redness to the area.  Additionally he has been feeling more fatigued and then today while at work, while going up some stairs he felt like he  became acutely short of breath and felt like he was going to pass out , thus prompting the visit to the ED. ED course and data review: On arrival tachycardic to up to 136 and tachypneic to 22 but afebrile and with SBP in the 130s to 150s. Labs: WBC 11,000 with lactic acid 3.2--1.7. Hemoglobin at baseline at 11.4 Creatinine 1.76 up from baseline of 1.13 about 6 months prior Blood glucose 255 EKG, personally viewed and interpreted showing A-fib at 124 with no ischemic changes. Foot x-ray showing possible osteomyelitis with recommendation for MRI as follows IMPRESSION: Transmetatarsal amputation of the left foot with soft tissue swelling and irregularity about the stump of the first metatarsal. Questionable loss of cortical distinctness about the stumps of the first and second metatarsals. Osteomyelitis is not excluded. Consider MRI further evaluation.   Chest x-ray nonacute   Patient started on vancomycin and Rocephin   Hospitalist consulted for admission.     Assessment and Plan:  Severe sepsis with acute organ dysfunction (AKI) Diabetic foot infection  with possible osteomyelitis, left  (HCC) PAD s/p left transmetatarsal amputation 07/2022 MSSA Bacteremia Sepsis criteria include tachycardia, tachypnea, leukocytosis and lactic acidosis with concerns for wound infection and possible osteomyelitis Blood cultures yielded MSSA Initially on Rocephin and vancomycin >> Ancef Patient serum creatinine on admission was 1.76 and has improved with IV fluid hydration Completed sepsis fluids. 2D echo - no vegetations seen Cardiology consulted for TEE - done 10/31 - no vegetations ID following --  Continue Ancef Follow cultures Podiatry following -- taking for surgical debridement this afternoon     * Postural dizziness with presyncope Secondary to sepsis and A-fib with RVR Improved Continuous cardiac monitoring and neurologic checks      Atrial fibrillation with RVR (HCC) Likely driven by sepsis Improved with IV fluids for treatment of sepsis and continuation of home metoprolol Continuous cardiac monitoring Eliquis on hold in anticipation of possible wound debridement Patient not on any heparin bridge due to active bleeding from his TMA stump    AKI (acute kidney injury) (HCC) creatinine 1.76 up from baseline of 1.13 about 6 months prior Likely prerenal, related to sepsis Renal function has improved with IV fluid hydration Monitor renal function and avoid nephrotoxins    Controlled type 2 diabetes mellitus with neuropathy (HCC) Continue basal insulin with sliding scale insulin coverage Continue gabapentin    CAD S/P CABG x 4 PAD s/p left transmetatarsal amputation Continue metoprolol and statin Plavix is on hold for possible procedure         Subjective: Patient awake resting in  bed when seen today.  He denies complaints or needs at this time. Discussed awaiting culture results to determine d/c antibiotic plan.   Physical Exam: Vitals:   03/01/23 1946 03/01/23 2218 03/01/23 2316 03/02/23 0336  BP: 120/75 112/69 127/68  113/73  Pulse: 81 76 83 73  Resp: 16  18 16   Temp: 98.2 F (36.8 C)  98.7 F (37.1 C) 98.2 F (36.8 C)  TempSrc:    Oral  SpO2: 97%  99% 96%  Weight:      Height:       General exam: awake, alert, no acute distress HEENT: moist mucus membranes, hearing grossly normal  Respiratory system: on room air, normal respiratory effort. Cardiovascular system: RRR, no pedal edema.   Central nervous system: A&O x 3. no gross focal neurologic deficits, normal speech Extremities: left foot dressing clean dry intact,, no peripheral edema Skin: dry, intact, no rashes seen Psychiatry: normal mood, congruent affect, judgement and insight appear normal   Data Reviewed: No new labs today CBG's 257 >> 160 >> 207 >> 244  Notable labs from 10/31---  Na 132, glucose 265, Cr 1.28, Ca 8.2, Hbg 10.0  TEE - report pending but no vegetations seen per verbal report   Family Communication: Plan of care discussed with patient in detail.   Disposition: Status is: Inpatient Remains inpatient appropriate because: remains on empiric IV antibiotics pending cultures   Planned Discharge Destination:  TBD    Time spent: 35 minutes  Author: Pennie Banter, DO 03/02/2023 4:33 PM  For on call review [email or web address].

## 2023-03-02 NOTE — Progress Notes (Signed)
Physical Therapy Treatment Patient Details Name: Andrew Harding MRN: [id number] DOB: 1950/07/08 Today's Date: 03/02/2023   History of Present Illness 72 y.o. male with medical history significant for insulin-dependent DM 2, A-fib on Eliquis, HTN, CAD status post CABG, CKD 3B, PAD s/p transmetatarsal amputation left foot who is being admitted with a foot wound infection with sepsis and A fib with RVR after presenting with a presyncopal episode.  Patient was last seen by podiatry 2 weeks ago on 02/12/2023 with concern for draining and bleeding from a wound at the bottom of his foot, now s/p I&D and 5th met excission, h/o transmet amp earlier this year.    PT Comments  Pt was pleasant and motivated to participate during the session and put forth good effort throughout. Pt continues to be independent with bed mobility and transfers, continuing to need supervision for ambulation. Once up he was able to perform ~130 foot amb bout with RW, showing good UE control and strength, no cues needed for RW management. He took x2 ~20 sec standing rest breaks but otherwise noting no fatigue or SOB during bout. Pt able to maintain NWB on LLE throughout session with little to no cues to do so, showing good carryover from prior session. Pt educated on benefits of wearing a shoe on RLE when amb for greater ease of NWB on LLE, as well as comfort on R foot. Provided further education on benefits of frequent exercises and mobility to maintain functional strength of LE's. Pt will benefit from continued PT services upon discharge to safely address deficits listed in patient problem list for decreased caregiver assistance and eventual return to PLOF.    If plan is discharge home, recommend the following: A little help with bathing/dressing/bathroom;A little help with walking and/or transfers;Assistance with cooking/housework;Assist for transportation;Help with stairs or ramp for entrance   Can travel by private vehicle         Equipment Recommendations  None recommended by PT (has a walker, Spoke with pt about possibility of BSC; pt reports having comfort height toilet and declined BSC)    Recommendations for Other Services       Precautions / Restrictions Precautions Precautions: Fall Restrictions Weight Bearing Restrictions: Yes LLE Weight Bearing: Non weight bearing     Mobility  Bed Mobility Overal bed mobility: Independent                  Transfers Overall transfer level: Modified independent Equipment used: Rolling walker (2 wheels)               General transfer comment: Pt needing no cues for hand placement or set-up. Showing good carryover from prior session with sequencing. Pt showing good ability to maintain NWB status with multiple STS's during session    Ambulation/Gait Ambulation/Gait assistance: Supervision Gait Distance (Feet): 130 Feet Assistive device: Rolling walker (2 wheels) Gait Pattern/deviations: WFL(Within Functional Limits) Gait velocity: decreased     General Gait Details: Pt able to maintain NWB with single LE hop gait on R leg, good UE control with pt noting no fatigue, though did take 2 standing rest breaks for ~20 seconds each before progressing with amb.   Stairs             Wheelchair Mobility     Tilt Bed    Modified Rankin (Stroke Patients Only)       Balance Overall balance assessment: Modified Independent  Cognition Arousal: Alert Behavior During Therapy: WFL for tasks assessed/performed Overall Cognitive Status: Within Functional Limits for tasks assessed                                          Exercises Other Exercises Other Exercises: Pt educated on wearing a hsoe on RLE when ambulating for greater ease maintaining NWB, as well as for comfort on R foot Other Exercises: Pt educated on importance on frequent mobility and exercises to maintain  functonal satrength of LE's    General Comments        Pertinent Vitals/Pain Pain Assessment Pain Assessment: Faces Faces Pain Scale: No hurt    Home Living                          Prior Function            PT Goals (current goals can now be found in the care plan section) Progress towards PT goals: Progressing toward goals    Frequency    Min 1X/week      PT Plan      Co-evaluation              AM-PAC PT "6 Clicks" Mobility   Outcome Measure  Help needed turning from your back to your side while in a flat bed without using bedrails?: None Help needed moving from lying on your back to sitting on the side of a flat bed without using bedrails?: None Help needed moving to and from a bed to a chair (including a wheelchair)?: None Help needed standing up from a chair using your arms (e.g., wheelchair or bedside chair)?: None Help needed to walk in hospital room?: A Little Help needed climbing 3-5 steps with a railing? : A Little 6 Click Score: 22    End of Session Equipment Utilized During Treatment: Gait belt Activity Tolerance: Patient tolerated treatment well Patient left: with call bell/phone within reach;in chair Nurse Communication: Mobility status (Pt status;nursing made aware of and cleared pt to be left in chair with no chair alarm) PT Visit Diagnosis: Unsteadiness on feet (R26.81);Difficulty in walking, not elsewhere classified (R26.2);Muscle weakness (generalized) (M62.81)     Time: 2956-2130 PT Time Calculation (min) (ACUTE ONLY): 24 min  Charges:                            Cecile Sheerer, SPT 03/02/23, 5:30 PM

## 2023-03-02 NOTE — Progress Notes (Signed)
Daily Progress Note   Subjective  - 1 Day Post-Op  Status post revision surgery left first metatarsal.  Doing well.  No complaints.  Echo was negative for vegetation.  Objective Vitals:   03/01/23 1946 03/01/23 2218 03/01/23 2316 03/02/23 0336  BP: 120/75 112/69 127/68 113/73  Pulse: 81 76 83 73  Resp: 16  18 16   Temp: 98.2 F (36.8 C)  98.7 F (37.1 C) 98.2 F (36.8 C)  TempSrc:    Oral  SpO2: 97%  99% 96%  Weight:      Height:        Physical Exam: Wound is healing nicely.  Little bit of maceration as expected from the open draining spot.  No signs of purulence at this time.  No foul odor.  Laboratory CBC    Component Value Date/Time   WBC 7.2 03/01/2023 0441   HGB 10.0 (L) 03/01/2023 0441   HCT 29.3 (L) 03/01/2023 0441   PLT 285 03/01/2023 0441    BMET    Component Value Date/Time   NA 132 (L) 03/01/2023 0441   K 4.0 03/01/2023 0441   CL 101 03/01/2023 0441   CO2 24 03/01/2023 0441   GLUCOSE 265 (H) 03/01/2023 0441   GLUCOSE 402 (H) 09/17/2012 1417   BUN 14 03/01/2023 0441   CREATININE 1.28 (H) 03/01/2023 0441   CALCIUM 8.2 (L) 03/01/2023 0441   GFRNONAA 59 (L) 03/01/2023 0441   GFRAA 58 (L) 06/02/2015 0656   Intraoperative culture:  RARE STAPHYLOCOCCUS AUREUS SUSCEPTIBILITIES TO FOLLOW RARE STREPTOCOCCUS MITIS/ORALIS   Assessment/Planning: Diabetic foot infection status post I&D with debridement Sepsis with positive blood cultures  Incisions coapted nicely.  Mild maceration as expected. Appreciate infectious disease assistance. I recommended nonweightbearing to his foot. Dressing changed today. I will see while in house.  Upon discharge he will need to follow-up with me in 1 to 2 weeks  Irean Hong  03/02/2023, 3:30 PM

## 2023-03-02 NOTE — TOC Benefit Eligibility Note (Signed)
Patient Product/process development scientist completed.    The patient is insured through [facility]. Patient has [employer], may use a copay card, and/or apply for patient assistance if available.    Ran test claim for dicloxacillin 500mg  and the current 24 day co-pay is $47.00.   This test claim was processed through [facility]- copay amounts may vary at other pharmacies due to pharmacy/plan contracts, or as the patient moves through the different stages of their insurance plan.     Roland Earl, CPHT Pharmacy Technician III Certified Patient Advocate [facility] Patient Advocate Team Direct Number: [phone]  Fax: [phone]

## 2023-03-02 NOTE — Inpatient Diabetes Management (Signed)
Inpatient Diabetes Program Recommendations  AACE/ADA: New Consensus Statement on Inpatient Glycemic Control (2015)  Target Ranges:  Prepandial:   less than 140 mg/dL      Peak postprandial:   less than 180 mg/dL (1-2 hours)      Critically ill patients:  140 - 180 mg/dL   Lab Results  Component Value Date   GLUCAP 207 (H) 03/02/2023   HGBA1C 7.5 (H) 02/26/2023    Review of Glycemic Control  Latest Reference Range & Units 03/01/23 16:23 03/01/23 20:40 03/02/23 07:46  Glucose-Capillary 70 - 99 mg/dL 621 (H) 308 (H) 657 (H)  (H): Data is abnormally high Diabetes history: Type 2 DM Outpatient Diabetes medications: Lantus 26 units QA, Ozempic qwk, Metformin 2000 mg QPM, Novolog (NT) Current orders for Inpatient glycemic control: Semglee 20 units at bedtime, Novolog 0-15 units TID  Inpatient Diabetes Program Recommendations:    Consider adding Novolog 3 units TID (assuming patient is consuming >50% of meals)  Thanks, Lujean Rave, MSN, RNC-OB Diabetes Coordinator [phone] (8a-5p)

## 2023-03-02 NOTE — Progress Notes (Addendum)
ID Pt doing fine Underwent left foot surgery yesterday BP 107/71 (BP Location: Left Arm)   Pulse 81   Temp 98.1 F (36.7 C)   Resp 18   Ht 6\' 3"  (1.905 m)   Wt 97 kg   SpO2 99%   BMI 26.73 kg/m   Chest b/l air entry Hss1s2 Abd soft CNS non focal Left foot picture reviewed     Labs    Latest Ref Rng & Units 03/01/2023    4:41 AM 02/27/2023    4:21 AM 02/26/2023    4:30 PM  CBC  WBC 4.0 - 10.5 K/uL 7.2  9.2  11.1   Hemoglobin 13.0 - 17.0 g/dL 96.2  9.9  95.2   Hematocrit 39.0 - 52.0 % 29.3  29.5  35.1   Platelets 150 - 400 K/uL 285  265  357        Latest Ref Rng & Units 03/01/2023    4:41 AM 02/27/2023    4:21 AM 02/26/2023    4:30 PM  CMP  Glucose 70 - 99 mg/dL 841  324  401   BUN 8 - 23 mg/dL 14  18  23    Creatinine 0.61 - 1.24 mg/dL 0.27  2.53  6.64   Sodium 135 - 145 mmol/L 132  131  132   Potassium 3.5 - 5.1 mmol/L 4.0  3.8  4.3   Chloride 98 - 111 mmol/L 101  101  97   CO2 22 - 32 mmol/L 24  23  23    Calcium 8.9 - 10.3 mg/dL 8.2  8.2  8.9   Total Protein 6.5 - 8.1 g/dL   8.4   Total Bilirubin 0.3 - 1.2 mg/dL   0.8   Alkaline Phos 38 - 126 U/L   64   AST 15 - 41 U/L   27   ALT 0 - 44 U/L   28     Micro- BC MSSA from 10/28 10/31 BC pending Wound culture from OR pending Bed side wound culture MSSA and strep mitis  Impression/recommendation  MSSA bacteremia secondary to left foot infection with ulcer Patient underwent 2D echo and there is a question of a degenerative versus vegetation on the aortic valve TEE negative for any endocaritis Patient is currently on cefazolin Repeat blood culture sent Pt does not want to take IV antibiotic at home on discharge- He wants oral antibiotic Await repeat blood culture and WC to finalize Will continue IV cefazolin atleast for a week until 03/05/23 and then may be able to switch PO diclox or Keflex 1 gram PO Q6( depending on culture result) for 3 more weeks after that. Pt knows this is not the best regimen for  MSSA bacteremia and IV cefazolin is, and has made the decision to take oral antibiotic. 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

## 2023-03-03 DIAGNOSIS — R55 Syncope and collapse: Secondary | ICD-10-CM | POA: Diagnosis not present

## 2023-03-03 DIAGNOSIS — R42 Dizziness and giddiness: Secondary | ICD-10-CM | POA: Diagnosis not present

## 2023-03-03 LAB — GLUCOSE, CAPILLARY
Glucose-Capillary: 167 mg/dL — ABNORMAL HIGH (ref 70–99)
Glucose-Capillary: 165 mg/dL — ABNORMAL HIGH (ref 70–99)
Glucose-Capillary: 176 mg/dL — ABNORMAL HIGH (ref 70–99)
Glucose-Capillary: 164 mg/dL — ABNORMAL HIGH (ref 70–99)

## 2023-03-03 MED ORDER — INSULIN ASPART 100 UNIT/ML IJ SOLN
3.0000 [IU] | Freq: Three times a day (TID) | INTRAMUSCULAR | Status: DC
  Administered 2023-03-03 – 2023-03-05 (×7): 3 [IU] via SUBCUTANEOUS
  Filled 2023-03-03 (×7): qty 1

## 2023-03-03 NOTE — Progress Notes (Signed)
OT Cancellation Note  Patient Details Name: Andrew Harding MRN: [id number] DOB: 05/20/1950   Cancelled Treatment:    Reason Eval/Treat Not Completed: OT screened, no needs identified, will sign off. Chart reviewed. On arrival pt reports no skilled acute OT needs, noted to be ambulating >100 ft with PT and good dynamic balances. Will sign off.   Kathie Dike, M.S. OTR/L  03/03/23, 12:48 PM  ascom [phone]

## 2023-03-03 NOTE — Progress Notes (Signed)
Progress Note   [name] NWG:[id number] DOB: [birth year] DOA: 02/26/2023     5 DOS: the patient was seen and examined on 03/03/2023   Brief hospital course: Andrew Harding is a 72 y.o. male with medical history significant for insulin-dependent DM 2, A-fib on Eliquis, HTN, CAD status post CABG, CKD 3B, PAD s/p transmetatarsal amputation left foot  who is being admitted with a foot wound infection with sepsis and A fib with RVR after presenting with a presyncopal episode.  Patient was last seen by podiatry 2 weeks ago on 02/12/2023 with concern for draining and bleeding from a wound at the bottom of his foot   He was prescribed Santyl dressings.  Since his visit however, he has noticed more redness to the area.  Additionally he has been feeling more fatigued and then today while at work, while going up some stairs he felt like he  became acutely short of breath and felt like he was going to pass out , thus prompting the visit to the ED. ED course and data review: On arrival tachycardic to up to 136 and tachypneic to 22 but afebrile and with SBP in the 130s to 150s. Labs: WBC 11,000 with lactic acid 3.2--1.7. Hemoglobin at baseline at 11.4 Creatinine 1.76 up from baseline of 1.13 about 6 months prior Blood glucose 255 EKG, personally viewed and interpreted showing A-fib at 124 with no ischemic changes. Foot x-ray showing possible osteomyelitis with recommendation for MRI as follows IMPRESSION: Transmetatarsal amputation of the left foot with soft tissue swelling and irregularity about the stump of the first metatarsal. Questionable loss of cortical distinctness about the stumps of the first and second metatarsals. Osteomyelitis is not excluded. Consider MRI further evaluation.   Chest x-ray nonacute   Patient started on vancomycin and Rocephin   Hospitalist consulted for admission.     Assessment and Plan:  Severe sepsis with acute organ dysfunction (AKI) Diabetic foot infection  with possible osteomyelitis, left  (HCC) PAD s/p left transmetatarsal amputation 07/2022 MSSA Bacteremia Sepsis criteria include tachycardia, tachypnea, leukocytosis and lactic acidosis with concerns for wound infection and possible osteomyelitis Blood cultures yielded MSSA Initially on Rocephin and vancomycin >> Ancef Patient serum creatinine on admission was 1.76 and has improved with IV fluid hydration Completed sepsis fluids. 2D echo - no vegetations seen Cardiology consulted for TEE - done 10/31 - no vegetations ID following --  Continue Ancef through at least 03/05/23 D/c antibiotics will be PO Diclox or Keflex for 3 more weeks, Weekly IV Dalbavancin for 2 weeks at day surgery Follow cultures to completion Podiatry following  S/p surgical debridement with Dr. Ether Griffins on 10/30   * Postural dizziness with presyncope Secondary to sepsis and A-fib with RVR Resolved     Atrial fibrillation with RVR (HCC) Likely driven by sepsis Improved with IV fluids for treatment of sepsis and continuation of home metoprolol Continuous cardiac monitoring Eliquis on hold in anticipation of possible wound debridement Patient not on any heparin bridge due to active bleeding from his TMA stump    AKI (acute kidney injury) (HCC) creatinine 1.76 up from baseline of 1.13 about 6 months prior Likely prerenal, related to sepsis Renal function has improved with IV fluid hydration Monitor renal function and avoid nephrotoxins    Controlled type 2 diabetes mellitus with neuropathy (HCC) Continue basal insulin with sliding scale insulin coverage Continue gabapentin    CAD S/P CABG x 4 PAD s/p left transmetatarsal amputation Continue metoprolol and statin  Plavix is on hold for possible procedure         Subjective: Patient awake resting in bed when seen today.  He reports overall doing well, denies acute complaints.  Agreeable with ID's plan for PO antibiotic and day surgery visits for long  acting IV antibiotic.     Physical Exam: Vitals:   03/02/23 2325 03/03/23 0330 03/03/23 0811 03/03/23 1247  BP: 103/63 100/63 101/73 123/72  Pulse: 80 71 80 (!) 56  Resp: 14 14 16 16   Temp: 97.9 F (36.6 C) 97.9 F (36.6 C) 98 F (36.7 C) 97.7 F (36.5 C)  TempSrc: Oral Oral    SpO2: 97% 99% 97% 98%  Weight:      Height:       General exam: awake, alert, no acute distress HEENT: moist mucus membranes, hearing grossly normal  Respiratory system: on room air, normal respiratory effort. Cardiovascular system: RRR, no pedal edema.   Central nervous system: A&O x 3. no gross focal neurologic deficits, normal speech Extremities: left foot dressing clean dry intact,, no peripheral edema Skin: dry, intact, no rashes seen Psychiatry: normal mood, congruent affect, judgement and insight appear normal   Data Reviewed: No new labs today  CBG's 157 >> 248 >> 167 >> 1165  Notable labs from 10/31---  Na 132, glucose 265, Cr 1.28, Ca 8.2, Hbg 10.0  TEE - report pending but no vegetations seen per verbal report   Family Communication: Plan of care discussed with patient in detail.   Disposition: Status is: Inpatient Remains inpatient appropriate because: remains on empiric IV antibiotics pending cultures   Planned Discharge Destination:  TBD    Time spent: 35 minutes  Author: Pennie Banter, DO 03/03/2023 2:18 PM  For on call review [email or web address].

## 2023-03-04 DIAGNOSIS — R42 Dizziness and giddiness: Secondary | ICD-10-CM | POA: Diagnosis not present

## 2023-03-04 DIAGNOSIS — R55 Syncope and collapse: Secondary | ICD-10-CM | POA: Diagnosis not present

## 2023-03-04 LAB — GLUCOSE, CAPILLARY
Glucose-Capillary: 210 mg/dL — ABNORMAL HIGH (ref 70–99)
Glucose-Capillary: 212 mg/dL — ABNORMAL HIGH (ref 70–99)
Glucose-Capillary: 210 mg/dL — ABNORMAL HIGH (ref 70–99)
Glucose-Capillary: 199 mg/dL — ABNORMAL HIGH (ref 70–99)

## 2023-03-04 MED ORDER — INSULIN GLARGINE-YFGN 100 UNIT/ML ~~LOC~~ SOLN
34.0000 [IU] | Freq: Every day | SUBCUTANEOUS | Status: DC
  Administered 2023-03-04: 34 [IU] via SUBCUTANEOUS
  Filled 2023-03-04 (×2): qty 0.34

## 2023-03-04 MED ORDER — INSULIN GLARGINE-YFGN 100 UNIT/ML ~~LOC~~ SOLN
36.0000 [IU] | Freq: Every day | SUBCUTANEOUS | Status: DC
  Filled 2023-03-04: qty 0.36

## 2023-03-04 NOTE — Progress Notes (Signed)
Progress Note   [name] GEX:[id number] DOB: 09-07-1950 DOA: 02/26/2023     6 DOS: the patient was seen and examined on 03/04/2023   Brief hospital course: Andrew Harding is a 72 y.o. male with medical history significant for insulin-dependent DM 2, A-fib on Eliquis, HTN, CAD status post CABG, CKD 3B, PAD s/p transmetatarsal amputation left foot  who is being admitted with a foot wound infection with sepsis and A fib with RVR after presenting with a presyncopal episode.  Patient was last seen by podiatry 2 weeks ago on 02/12/2023 with concern for draining and bleeding from a wound at the bottom of his foot   He was prescribed Santyl dressings.  Since his visit however, he has noticed more redness to the area.  Additionally he has been feeling more fatigued and then today while at work, while going up some stairs he felt like he  became acutely short of breath and felt like he was going to pass out , thus prompting the visit to the ED. ED course and data review: On arrival tachycardic to up to 136 and tachypneic to 22 but afebrile and with SBP in the 130s to 150s. Labs: WBC 11,000 with lactic acid 3.2--1.7. Hemoglobin at baseline at 11.4 Creatinine 1.76 up from baseline of 1.13 about 6 months prior Blood glucose 255 EKG, personally viewed and interpreted showing A-fib at 124 with no ischemic changes. Foot x-ray showing possible osteomyelitis with recommendation for MRI as follows IMPRESSION: Transmetatarsal amputation of the left foot with soft tissue swelling and irregularity about the stump of the first metatarsal. Questionable loss of cortical distinctness about the stumps of the first and second metatarsals. Osteomyelitis is not excluded. Consider MRI further evaluation.   Chest x-ray nonacute   Patient started on vancomycin and Rocephin   Hospitalist consulted for admission.     Assessment and Plan:  Severe sepsis with acute organ dysfunction (AKI) Diabetic foot infection  with possible osteomyelitis, left  (HCC) PAD s/p left transmetatarsal amputation 07/2022 MSSA Bacteremia Sepsis criteria include tachycardia, tachypnea, leukocytosis and lactic acidosis with concerns for wound infection and possible osteomyelitis Blood cultures yielded MSSA Initially on Rocephin and vancomycin >> Ancef Patient serum creatinine on admission was 1.76 and has improved with IV fluid hydration Completed sepsis fluids. 2D echo - no vegetations seen Cardiology consulted for TEE - done 10/31 - no vegetations ID following --  Continue Ancef through at least 03/05/23 D/c antibiotics will be PO Diclox or Keflex for 3 more weeks, Weekly IV Dalbavancin for 2 weeks at day surgery Follow cultures to completion Podiatry following  S/p surgical debridement with Dr. Ether Griffins on 10/30   * Postural dizziness with presyncope Secondary to sepsis and A-fib with RVR Resolved     Atrial fibrillation with RVR (HCC) Likely driven by sepsis Improved with IV fluids for treatment of sepsis and continuation of home metoprolol Continuous cardiac monitoring Eliquis on hold in anticipation of possible wound debridement Patient not on any heparin bridge due to active bleeding from his TMA stump    AKI (acute kidney injury) (HCC) creatinine 1.76 up from baseline of 1.13 about 6 months prior Likely prerenal, related to sepsis Renal function has improved with IV fluid hydration Monitor renal function and avoid nephrotoxins    Controlled type 2 diabetes mellitus with neuropathy (HCC) Continue basal insulin with sliding scale insulin coverage Continue gabapentin    CAD S/P CABG x 4 PAD s/p left transmetatarsal amputation Continue metoprolol and statin  Plavix is on hold for possible procedure         Subjective: Patient awake resting in bed with visitor present this AM.  He denies complaints.  In generally feeling better.  Eager to d/c home hopefully tomorrow.   Physical Exam: Vitals:    03/03/23 2348 03/04/23 0331 03/04/23 0843 03/04/23 1215  BP:  (!) 95/58 127/79 115/75  Pulse: 75 79 70 67  Resp:  14 16 16   Temp:  97.8 F (36.6 C) 97.6 F (36.4 C) 97.7 F (36.5 C)  TempSrc:  Oral    SpO2:  99% 98% 97%  Weight:      Height:       General exam: awake, alert, no acute distress HEENT: moist mucus membranes, hearing grossly normal  Respiratory system: on room air, normal respiratory effort. Cardiovascular system: RRR, no peripheral edema.   Central nervous system: A&O x4. no gross focal neurologic deficits, normal speech Extremities: moves all, no edema, normal tone Skin: dry, intact, no rashes seen Psychiatry: normal mood, congruent affect, judgement and insight appear normal    Data Reviewed: No new labs today  CBG's 165 >> 176 >> 164 >> 210 >> 212  Notable labs from 10/31---  Na 132, glucose 265, Cr 1.28, Ca 8.2, Hbg 10.0  TEE - report pending but no vegetations seen per verbal report   Family Communication: Plan of care discussed with patient in detail.   Disposition: Status is: Inpatient Remains inpatient appropriate because: remains on empiric IV antibiotics pending cultures   Planned Discharge Destination:  TBD    Time spent: 35 minutes  Author: Pennie Banter, DO 03/04/2023 3:10 PM  For on call review [email or web address].

## 2023-03-04 NOTE — Progress Notes (Signed)
Daily Progress Note   Subjective  - 3 Days Post-Op  Follow-up debridement first metatarsal with delayed primary closure.  Doing well.  No complaints  Objective Vitals:   03/03/23 2230 03/03/23 2347 03/03/23 2348 03/04/23 0331  BP: 128/77 112/76  (!) 95/58  Pulse: 85 77 75 79  Resp:  16  14  Temp:  98 F (36.7 C)  97.8 F (36.6 C)  TempSrc:  Oral  Oral  SpO2:  98%  99%  Weight:      Height:        Physical Exam: Wound remains coapted.  Scant drainage to first ray region.  Plantar ulceration is dry at this time.  Plantar wound is nice and dry.  Good healthy granular tissue base.  Laboratory CBC    Component Value Date/Time   WBC 7.2 03/01/2023 0441   HGB 10.0 (L) 03/01/2023 0441   HCT 29.3 (L) 03/01/2023 0441   PLT 285 03/01/2023 0441    BMET    Component Value Date/Time   NA 132 (L) 03/01/2023 0441   K 4.0 03/01/2023 0441   CL 101 03/01/2023 0441   CO2 24 03/01/2023 0441   GLUCOSE 265 (H) 03/01/2023 0441   GLUCOSE 402 (H) 09/17/2012 1417   BUN 14 03/01/2023 0441   CREATININE 1.28 (H) 03/01/2023 0441   CALCIUM 8.2 (L) 03/01/2023 0441   GFRNONAA 59 (L) 03/01/2023 0441   GFRAA 58 (L) 06/02/2015 0656   Bone culture: RARE STAPHYLOCOCCUS AUREUS  RARE STREPTOCOCCUS MITIS/ORALIS  SUSCEPTIBILITIES TO FOLLOW  NO ANAEROBES ISOLATED; CULTURE IN PROGRESS FOR 5 DAYS    Pathology: FINAL DIAGNOSIS        1. Bone, fragment(s), Left foot 1st metatarsal :       - REACTIVE BONE WITH EXTENSIVE, ACUTE OSTEOMYELITIS       - PROXIMAL BONE MARGIN IS INVOLVED BY ACUTE OSTEOMYELITIS    Assessment/Planning: Osteomyelitis status post I&D with resection  Patient with Staph aureus and strep species on bone culture.  Pathology shows proximal margin involved. Will continue to monitor for further signs of infection.  If worsening infection occurs may need further resection.  Patient currently on antibiotics and appreciate ID recommendations. Patient to remain nonweightbearing to  the foot. New dressing applied today.  If patient is discharged on Monday please schedule follow-up with my office on Thursday.  Otherwise if patient is still in house on Tuesday will reevaluate at that time. I have discussed dressing changes with the patient.  Upon discharge he will perform dry dressing changes.  [name] A  03/04/2023, 8:12 AM

## 2023-03-04 NOTE — Plan of Care (Signed)

## 2023-03-05 ENCOUNTER — Other Ambulatory Visit: Payer: Self-pay | Admitting: Infectious Diseases

## 2023-03-05 ENCOUNTER — Other Ambulatory Visit: Payer: Self-pay

## 2023-03-05 DIAGNOSIS — R42 Dizziness and giddiness: Secondary | ICD-10-CM | POA: Diagnosis not present

## 2023-03-05 DIAGNOSIS — M8610 Other acute osteomyelitis, unspecified site: Secondary | ICD-10-CM

## 2023-03-05 DIAGNOSIS — M86172 Other acute osteomyelitis, left ankle and foot: Secondary | ICD-10-CM

## 2023-03-05 DIAGNOSIS — R55 Syncope and collapse: Secondary | ICD-10-CM | POA: Diagnosis not present

## 2023-03-05 LAB — BASIC METABOLIC PANEL
Anion gap: 9 (ref 5–15)
BUN: 17 mg/dL (ref 8–23)
CO2: 24 mmol/L (ref 22–32)
Calcium: 8.5 mg/dL — ABNORMAL LOW (ref 8.9–10.3)
Chloride: 103 mmol/L (ref 98–111)
Creatinine, Ser: 1.23 mg/dL (ref 0.61–1.24)
GFR, Estimated: >60 mL/min (ref >60)
Glucose, Bld: 158 mg/dL — ABNORMAL HIGH (ref 70–99)
Potassium: 4 mmol/L (ref 3.5–5.1)
Sodium: 136 mmol/L (ref 135–145)

## 2023-03-05 LAB — AEROBIC/ANAEROBIC CULTURE W GRAM STAIN (SURGICAL/DEEP WOUND)

## 2023-03-05 LAB — CBC
HCT: 33.2 % — ABNORMAL LOW (ref 39.0–52.0)
Hemoglobin: 11.2 g/dL — ABNORMAL LOW (ref 13.0–17.0)
MCH: 29.2 pg (ref 26.0–34.0)
MCHC: 33.7 g/dL (ref 30.0–36.0)
MCV: 86.7 fL (ref 80.0–100.0)
Platelets: 339 10*3/uL (ref 150–400)
RBC: 3.83 MIL/uL — ABNORMAL LOW (ref 4.22–5.81)
RDW: 12.4 % (ref 11.5–15.5)
WBC: 7.2 10*3/uL (ref 4.0–10.5)
nRBC: 0 % (ref 0.0–0.2)

## 2023-03-05 LAB — GLUCOSE, CAPILLARY
Glucose-Capillary: 141 mg/dL — ABNORMAL HIGH (ref 70–99)
Glucose-Capillary: 119 mg/dL — ABNORMAL HIGH (ref 70–99)

## 2023-03-05 MED ORDER — CEPHALEXIN 500 MG PO CAPS
1000.0000 mg | ORAL_CAPSULE | Freq: Four times a day (QID) | ORAL | 0 refills | Status: AC
  Filled 2023-03-05: qty 224, 28d supply, fill #0

## 2023-03-05 MED ORDER — ALIGN PREBIOTIC-PROBIOTIC 5-1.25 MG-GM PO CHEW: 1.0000 | CHEWABLE_TABLET | ORAL | Status: DC

## 2023-03-05 MED ORDER — ONDANSETRON HCL 4 MG PO TABS
4.0000 mg | ORAL_TABLET | Freq: Four times a day (QID) | ORAL | 2 refills | Status: DC | PRN
  Filled 2023-03-05: qty 30, 8d supply, fill #0

## 2023-03-05 NOTE — Plan of Care (Signed)

## 2023-03-05 NOTE — Progress Notes (Signed)
ID Pt doing fine Waiting to go home BP 111/68 (BP Location: Right Arm)   Pulse 77   Temp 98.1 F (36.7 C)   Resp 17   Ht 6\' 3"  (1.905 m)   Wt 97 kg   SpO2 97%   BMI 26.73 kg/m   Chest b/l air entry [id number] Abd soft CNS non focal Left foot picture reviewed     Labs    Latest Ref Rng & Units 03/05/2023    5:57 AM 03/01/2023    4:41 AM 02/27/2023    4:21 AM  CBC  WBC 4.0 - 10.5 K/uL 7.2  7.2  9.2   Hemoglobin 13.0 - 17.0 g/dL 64.4  03.4  9.9   Hematocrit 39.0 - 52.0 % 33.2  29.3  29.5   Platelets 150 - 400 K/uL 339  285  265        Latest Ref Rng & Units 03/05/2023    5:57 AM 03/01/2023    4:41 AM 02/27/2023    4:21 AM  CMP  Glucose 70 - 99 mg/dL 742  595  638   BUN 8 - 23 mg/dL 17  14  18    Creatinine 0.61 - 1.24 mg/dL 7.56  4.33  2.95   Sodium 135 - 145 mmol/L 136  132  131   Potassium 3.5 - 5.1 mmol/L 4.0  4.0  3.8   Chloride 98 - 111 mmol/L 103  101  101   CO2 22 - 32 mmol/L 24  24  23    Calcium 8.9 - 10.3 mg/dL 8.5  8.2  8.2     Micro- BC MSSA from 10/28 10/31 BC NG Wound culture from OR MSSA and strep mitis Bed side wound culture MSSA and strep mitis  Impression/recommendation  MSSA bacteremia secondary to left foot infection with ulcer Acute osteomyelitis of the foot with proximal margin showing osteo ' Patient underwent 2D echo and there is a question of a degenerative versus vegetation on the aortic valve TEE negative for any endocaritis Patient is currently on cefazolin Repeat blood culture sent Pt does not want to take IV antibiotic at home on discharge- He got a week of cefazolin and will switch to Po Keflex 1 gram PO Q6( for weeks _) . 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 Osteomyelitis Underwent ffirst metatarsal resection and delayed primary closure Also underwent full thickness excisional debridement left medial arch  CAD status post CABG  A-fib on Eliquis and metoprolol   Previous infection of the left foot after thermal injury leading to TMA and Enterobacter and Enterococcus infection and he got 4 weeks of IV meropenem followed by 2 weeks of oral antibiotic earlier this year  Discussed the management with the patient and care team Explained to patient the side effects of keflex, to take probiotic, to monitor labs and call me if any issues Will follow him as OP-appt given for 12/5

## 2023-03-05 NOTE — Anesthesia Postprocedure Evaluation (Signed)
Anesthesia Post Note  Patient: [name]  Procedure(s) Performed: IRRIGATION AND DEBRIDEMENT FOOT (Left: Foot)  Patient location during evaluation: PACU Anesthesia Type: General Level of consciousness: awake and alert Pain management: pain level controlled Vital Signs Assessment: post-procedure vital signs reviewed and stable Respiratory status: spontaneous breathing, nonlabored ventilation, respiratory function stable and patient connected to nasal cannula oxygen Cardiovascular status: blood pressure returned to baseline and stable Postop Assessment: no apparent nausea or vomiting Anesthetic complications: no   No notable events documented.   Last Vitals:  Vitals:   03/04/23 1914 03/04/23 2356  BP: 110/75 117/74  Pulse: 75 69  Resp: 17 19  Temp: 36.6 C 37 C  SpO2: 99% 96%    Last Pain:  Vitals:   03/04/23 2000  TempSrc:   PainSc: 0-No pain                 Lenard Simmer

## 2023-03-05 NOTE — Discharge Summary (Addendum)
Physician Discharge Summary   [name] MRN: [id number] DOB: February 26, 1951  Admit date:     02/26/2023  Discharge date: 03/05/2023  Discharge Physician: Pennie Banter   PCP: Lynnea Ferrier, MD   Recommendations at discharge:   Follow up with Dr. Ether Griffins, Podiatry, as scheduled Follow up with Dr. Rivka Safer, Infectious Disease, as schedlued Report to day surgery tomorrow as scheduled for IV Dalbavancin Follow up with Primary Care in 1-2 weeks Repeat BMP, CBC next week and as indicated for monitoring while on antibiotics  Discharge Diagnoses: Principal Problem:   Postural dizziness with presyncope Active Problems:   Diabetic foot infection with possible osteomyelitis, left  (HCC)   S/P transmetatarsal amputation of foot, left (HCC)   Diabetic peripheral neuropathy (HCC)   CAD S/P CABG x 4   Controlled type 2 diabetes mellitus with neuropathy (HCC)   MSSA bacteremia  Resolved Problems:   Sepsis (HCC)   Atrial fibrillation with RVR (HCC)   AKI (acute kidney injury) [facility])  Hospital Course: Andrew Harding is a 72 y.o. male with medical history significant for insulin-dependent DM 2, A-fib on Eliquis, HTN, CAD status post CABG, CKD 3B, PAD s/p transmetatarsal amputation left foot  who is being admitted with a foot wound infection with sepsis and A fib with RVR after presenting with a presyncopal episode.  Patient was last seen by podiatry 2 weeks ago on 02/12/2023 with concern for draining and bleeding from a wound at the bottom of his foot   He was prescribed Santyl dressings.  Since his visit however, he has noticed more redness to the area.  Additionally he has been feeling more fatigued and then today while at work, while going up some stairs he felt like he  became acutely short of breath and felt like he was going to pass out , thus prompting the visit to the ED. ED course and data review: On arrival tachycardic to up to 136 and tachypneic to 22 but afebrile and with SBP in  the 130s to 150s. Labs: WBC 11,000 with lactic acid 3.2--1.7. Hemoglobin at baseline at 11.4 Creatinine 1.76 up from baseline of 1.13 about 6 months prior Blood glucose 255 EKG, personally viewed and interpreted showing A-fib at 124 with no ischemic changes. Foot x-ray showing possible osteomyelitis with recommendation for MRI as follows IMPRESSION: Transmetatarsal amputation of the left foot with soft tissue swelling and irregularity about the stump of the first metatarsal. Questionable loss of cortical distinctness about the stumps of the first and second metatarsals. Osteomyelitis is not excluded. Consider MRI further evaluation.     Patient started on vancomycin and Rocephin Hospitalist consulted for admission.    Further hospital course and management as outlined below.   11/4 -- pt doing well.  Remains medically stable and now cleared by Infectious Disease to d/c home on PO amoxicillin, in addition to 2 doses IV dalbavancin at day surgery tomorrow and one week later.   Pt medically stable and agreeable for d/c home today.    Assessment and Plan:  Severe sepsis with acute organ dysfunction (AKI) Diabetic foot infection with possible osteomyelitis, left  (HCC) PAD s/p left transmetatarsal amputation 07/2022 - now complicated by infection MSSA Bacteremia Sepsis criteria include tachycardia, tachypnea, leukocytosis and lactic acidosis with concerns for wound infection and possible osteomyelitis Blood cultures yielded MSSA Initially on Rocephin and vancomycin >> Ancef Patient serum creatinine on admission was 1.76 and has improved with IV fluid hydration Completed sepsis fluids.  2D echo - no vegetations seen Cardiology consulted for TEE - done 10/31 - no vegetations ID following --   Treated with IV Ancef through today 03/05/23 D/c antibiotics will be PO Keflex for 3 more weeks, Weekly IV Dalbavancin for 2 weeks at day surgery Podiatry consulted - outpatient follow up S/p  surgical debridement with Dr. Ether Griffins on 10/30     * Postural dizziness with presyncope Secondary to sepsis and A-fib with RVR Resolved     Atrial fibrillation with RVR (HCC) - resolved Likely driven by sepsis Improved with IV fluids for treatment of sepsis and continuation of home metoprolol On Eliquis and metoprolol HR's controlled     AKI (acute kidney injury) - resolved creatinine 1.76 up from baseline of 1.13 about 6 months prior Likely prerenal, related to sepsis Renal function has improved with IV fluid hydration Monitor renal function and avoid nephrotoxins     Controlled type 2 diabetes mellitus with neuropathy (HCC) Covered with basal insulin with sliding scale insulin coverage Continue gabapentin Resume home regimen at d/c Close PCP follow up to optimize glycemic control     CAD S/P CABG x 4 PAD s/p left transmetatarsal amputation Continue metoprolol and statin Plavix held --- resume        Consultants: ID, Podiatry, Cardiology Procedures performed: as above  Disposition: Home health Diet recommendation:  Cardiac and Carb modified diet DISCHARGE MEDICATION: Allergies as of 03/05/2023   No Known Allergies      Medication List     STOP taking these medications    amoxicillin-clavulanate 875-125 MG tablet Commonly known as: AUGMENTIN   ascorbic acid 500 MG tablet Commonly known as: VITAMIN C   cholecalciferol 1000 units tablet Commonly known as: VITAMIN D   ciprofloxacin 500 MG tablet Commonly known as: CIPRO   NovoLOG FlexPen 100 UNIT/ML FlexPen Generic drug: insulin aspart   nutrition supplement (JUVEN) Pack   nystatin 100000 UNIT/ML suspension Commonly known as: MYCOSTATIN   silver sulfADIAZINE 1 % cream Commonly known as: SILVADENE   Trulicity 4.5 MG/0.5ML Soaj Generic drug: Dulaglutide   Zinc Sulfate 220 (50 Zn) MG Tabs       TAKE these medications    acetaminophen 325 MG tablet Commonly known as: TYLENOL Take 1-2  tablets (325-650 mg total) by mouth every 4 (four) hours as needed for mild pain.   Align Prebiotic-Probiotic 5-1.25 MG-GM Chew Chew 1 tablet by mouth as directed.   apixaban 5 MG Tabs tablet Commonly known as: ELIQUIS Take 1 tablet (5 mg total) by mouth 2 (two) times daily.   cephALEXin 500 MG capsule Commonly known as: KEFLEX Take 2 capsules (1,000 mg total) by mouth 4 (four) times daily for 28 days.   clopidogrel 75 MG tablet Commonly known as: PLAVIX Take 1 tablet (75 mg total) by mouth daily with breakfast.   docusate sodium 100 MG capsule Commonly known as: COLACE Take 1 capsule (100 mg total) by mouth 2 (two) times daily.   Fifty50 Pen Needles 32G X 4 MM Misc Generic drug: Insulin Pen Needle USE 4 TIMES DAILY   FreeStyle Libre 2 Sensor Misc   gabapentin 300 MG capsule Commonly known as: NEURONTIN Take 1 capsule (300 mg total) by mouth 3 (three) times daily.   Lantus SoloStar 100 UNIT/ML Solostar Pen Generic drug: insulin glargine Inject 36 Units into the skin at bedtime. What changed: how much to take   metFORMIN 500 MG 24 hr tablet Commonly known as: GLUCOPHAGE-XR Take 2,000 mg by mouth every  evening. Take with dinner.   metoprolol tartrate 25 MG tablet Commonly known as: LOPRESSOR Take 1 tablet (25 mg total) by mouth 2 (two) times daily.   ondansetron 4 MG tablet Commonly known as: Zofran Take 1 tablet (4 mg total) by mouth every 6 (six) hours as needed for nausea or vomiting.   OneTouch Ultra test strip Generic drug: glucose blood Use 3 (three) times daily   Ozempic (0.25 or 0.5 MG/DOSE) 2 MG/3ML Sopn Generic drug: Semaglutide(0.25 or 0.5MG /DOS) Inject 0.75 mLs into the skin once a week.   pravastatin 80 MG tablet Commonly known as: PRAVACHOL TAKE ONE TABLET BY MOUTH AT NIGHT   traZODone 50 MG tablet Commonly known as: DESYREL TAKE ONE TABLET AT BEDTIME AS NEEDED               Discharge Care Instructions  (From admission, onward)            Start     Ordered   03/05/23 0000  Discharge wound care:       Comments: As instructed by Dr. Ether Griffins   03/05/23 1052            Follow-up Information     Alluri, Meryl Dare, MD. Go in 3 week(s).   Specialty: Cardiology Contact information: [address] [place] [address] [phone]                Discharge Exam: Andrew Harding Weights   02/26/23 1616 02/28/23 1519 03/01/23 1137  Weight: 97 kg 97 kg 97 kg   General exam: awake, alert, no acute distress HEENT: atraumatic, clear conjunctiva, anicteric sclera, moist mucus membranes, hearing grossly normal  Respiratory system: CTAB, no wheezes, rales or rhonchi, normal respiratory effort. Cardiovascular system: normal S1/S2, RRR, no JVD, murmurs, rubs, gallops, no pedal edema.   Gastrointestinal system: soft, NT, ND, no HSM felt, +bowel sounds. Central nervous system: A&O x 4. no gross focal neurologic deficits, normal speech Extremities: left foot dressing clean dry intact, no edema, normal tone Skin: dry, intact, normal temperature Psychiatry: normal mood, congruent affect, judgement and insight appear normal   Condition at discharge: stable  The results of significant diagnostics from this hospitalization (including imaging, microbiology, ancillary and laboratory) are listed below for reference.   Imaging Studies: ECHO TEE  Result Date: 03/01/2023    TRANSESOPHOGEAL ECHO REPORT   Patient Name:   Andrew Harding Date of Exam: 03/01/2023 Medical Rec #:  [id number]   Height:       75.0 in Accession #:    [id number]  Weight:       213.8 lb Date of Birth:  1951-01-07    BSA:          2.258 m Patient Age:    72 years    BP:           114/83 mmHg Patient Gender: M           HR:           80 bpm. Exam Location:  [facility] Procedure: Transesophageal Echo, Cardiac Doppler, Color Doppler and 3D Echo Indications:     Bacteremia  History:         Patient has prior history of Echocardiogram examinations, most                   recent 02/27/2023. Risk Factors:Hypertension and Dyslipidemia.  Sonographer:     Cristela Blue Referring Phys:  [id number] CARALYN HUDSON Diagnosing Phys: Windell Norfolk PROCEDURE: The transesophogeal  probe was passed without difficulty through the esophogus of the patient. Sedation performed by different physician. Image quality was excellent. The patient's vital signs; including heart rate, blood pressure, and oxygen saturation; remained stable throughout the procedure. The patient developed no complications during the procedure.  IMPRESSIONS  1. Left ventricular ejection fraction, by estimation, is 55 to 60%. The left ventricle has normal function.  2. Right ventricular systolic function is normal. The right ventricular size is normal.  3. Left atrial size was dilated. No left atrial/left atrial appendage thrombus was detected.  4. Right atrial size was dilated.  5. The mitral valve is normal in structure. Mild mitral valve regurgitation.  6. The aortic valve is tricuspid. There is mild calcification of the aortic valve. There is mild thickening of the aortic valve. Aortic valve regurgitation is trivial. Conclusion(s)/Recommendation(s): No evidence of vegetation/infective endocarditis on this transesophageael echocardiogram. FINDINGS  Left Ventricle: Left ventricular ejection fraction, by estimation, is 55 to 60%. The left ventricle has normal function. The left ventricular internal cavity size was normal in size. Right Ventricle: The right ventricular size is normal. No increase in right ventricular wall thickness. Right ventricular systolic function is normal. Left Atrium: Left atrial size was dilated. No left atrial/left atrial appendage thrombus was detected. Right Atrium: Right atrial size was dilated. Pericardium: There is no evidence of pericardial effusion. Mitral Valve: The mitral valve is normal in structure. Mild mitral valve regurgitation. Tricuspid Valve: The tricuspid valve is normal in structure.  Tricuspid valve regurgitation is trivial. Aortic Valve: The aortic valve is tricuspid. There is mild calcification of the aortic valve. There is mild thickening of the aortic valve. Aortic valve regurgitation is trivial. Pulmonic Valve: The pulmonic valve was normal in structure. Pulmonic valve regurgitation is not visualized. Aorta: The aortic root is normal in size and structure. IAS/Shunts: No atrial level shunt detected by color flow Doppler. Windell Norfolk Electronically signed by Windell Norfolk Signature Date/Time: 03/01/2023/5:48:07 PM    Final    ECHOCARDIOGRAM COMPLETE  Result Date: 02/27/2023    ECHOCARDIOGRAM REPORT   Patient Name:   Andrew Harding Date of Exam: 02/27/2023 Medical Rec #:  [id number]   Height:       75.0 in Accession #:    [id number]  Weight:       213.8 lb Date of Birth:  06-23-1950    BSA:          2.258 m Patient Age:    72 years    BP:           122/85 mmHg Patient Gender: M           HR:           86 bpm. Exam Location:  [facility] Procedure: 2D Echo, Cardiac Doppler and Color Doppler Indications:     Bacteremia  History:         Patient has prior history of Echocardiogram examinations, most                  recent 08/21/2022. CAD, Prior CABG, Arrythmias:Atrial                  Fibrillation, Signs/Symptoms:Bacteremia and                  Dizziness/Lightheadedness; Risk Factors:Hypertension, Diabetes                  and Dyslipidemia. CKD.  Sonographer:     Mikki Harbor Referring Phys:  [id number]  AGBATA Diagnosing Phys: Mellody Drown Alluri IMPRESSIONS  1. Left ventricular ejection fraction, by estimation, is 55 to 60%. The left ventricle has normal function. Left ventricular endocardial border not optimally defined to evaluate regional wall motion. There is mild left ventricular hypertrophy. Left ventricular diastolic parameters are indeterminate.  2. Right ventricular systolic function is mildly reduced. The right ventricular size is mildly enlarged. There is normal pulmonary artery  systolic pressure.  3. The mitral valve is normal in structure. Mild mitral valve regurgitation.  4. Small possible mobile echogenicity on aortic valve, vegetation vs degenerative change. Consider TEE for further evaluation if clinically indicated.. The aortic valve is tricuspid. There is mild calcification of the aortic valve. There is mild thickening of the aortic valve. Aortic valve regurgitation is not visualized.  5. The inferior vena cava is normal in size with greater than 50% respiratory variability, suggesting right atrial pressure of 3 mmHg. FINDINGS  Left Ventricle: Left ventricular ejection fraction, by estimation, is 55 to 60%. The left ventricle has normal function. Left ventricular endocardial border not optimally defined to evaluate regional wall motion. The left ventricular internal cavity size was normal in size. There is mild left ventricular hypertrophy. Left ventricular diastolic parameters are indeterminate. Right Ventricle: The right ventricular size is mildly enlarged. No increase in right ventricular wall thickness. Right ventricular systolic function is mildly reduced. There is normal pulmonary artery systolic pressure. The tricuspid regurgitant velocity  is 2.35 m/s, and with an assumed right atrial pressure of 8 mmHg, the estimated right ventricular systolic pressure is 30.1 mmHg. Left Atrium: Left atrial size was normal in size. Right Atrium: Right atrial size was normal in size. Pericardium: There is no evidence of pericardial effusion. Mitral Valve: The mitral valve is normal in structure. Mild mitral valve regurgitation. MV peak gradient, 4.8 mmHg. The mean mitral valve gradient is 2.0 mmHg. Tricuspid Valve: The tricuspid valve is normal in structure. Tricuspid valve regurgitation is mild. Aortic Valve: Small possible mobile echogenicity on aortic valve, vegetation vs degenerative change. Consider TEE for further evaluation if clinically indicated. The aortic valve is tricuspid. There  is mild calcification of the aortic valve. There is mild thickening of the aortic valve. Aortic valve regurgitation is not visualized. Aortic valve mean gradient measures 3.0 mmHg. Aortic valve peak gradient measures 4.6 mmHg. Aortic valve area, by VTI measures 2.63 cm. Pulmonic Valve: The pulmonic valve was not well visualized. Pulmonic valve regurgitation is trivial. Aorta: The aortic root is normal in size and structure. Venous: The inferior vena cava is normal in size with greater than 50% respiratory variability, suggesting right atrial pressure of 3 mmHg. IAS/Shunts: The interatrial septum was not well visualized.  LEFT VENTRICLE PLAX 2D LVIDd:         4.50 cm LVIDs:         2.50 cm LV PW:         1.30 cm LV IVS:        1.30 cm LVOT diam:     2.00 cm LV SV:         58 LV SV Index:   26 LVOT Area:     3.14 cm  RIGHT VENTRICLE RV Basal diam:  4.20 cm RV Mid diam:    4.10 cm RV S prime:     11.40 cm/s LEFT ATRIUM             Index        RIGHT ATRIUM  Index LA diam:        4.70 cm 2.08 cm/m   RA Area:     22.60 cm LA Vol (A2C):   53.9 ml 23.87 ml/m  RA Volume:   73.50 ml  32.56 ml/m LA Vol (A4C):   65.6 ml 29.06 ml/m LA Biplane Vol: 63.8 ml 28.26 ml/m  AORTIC VALVE AV Area (Vmax):    2.61 cm AV Area (Vmean):   2.42 cm AV Area (VTI):     2.63 cm AV Vmax:           107.50 cm/s AV Vmean:          76.850 cm/s AV VTI:            0.222 m AV Peak Grad:      4.6 mmHg AV Mean Grad:      3.0 mmHg LVOT Vmax:         89.40 cm/s LVOT Vmean:        59.100 cm/s LVOT VTI:          0.186 m LVOT/AV VTI ratio: 0.84  AORTA Ao Root diam: 3.70 cm MITRAL VALVE                TRICUSPID VALVE MV Area (PHT): 4.10 cm     TR Peak grad:   22.1 mmHg MV Area VTI:   2.27 cm     TR Vmax:        235.00 cm/s MV Peak grad:  4.8 mmHg MV Mean grad:  2.0 mmHg     SHUNTS MV Vmax:       1.10 m/s     Systemic VTI:  0.19 m MV Vmean:      57.5 cm/s    Systemic Diam: 2.00 cm MV Decel Time: 185 msec MV E velocity: 122.00 cm/s Windell Norfolk Electronically signed by Windell Norfolk Signature Date/Time: 02/27/2023/5:34:56 PM    Final    DG Foot Complete Left  Result Date: 02/26/2023 CLINICAL DATA:  Left foot wounds, sores, redness, leaking EXAM: LEFT FOOT - COMPLETE 3+ VIEW COMPARISON:  09/02/2022 FINDINGS: Transmetatarsal amputation of the left foot. Swelling about the distal stump and soft tissue irregularity about the stump of the first metatarsal. Questionable loss of cortical distinctness about the stumps of the first and second metatarsals. Vascular calcifications. IMPRESSION: Transmetatarsal amputation of the left foot with soft tissue swelling and irregularity about the stump of the first metatarsal. Questionable loss of cortical distinctness about the stumps of the first and second metatarsals. Osteomyelitis is not excluded. Consider MRI further evaluation. Electronically Signed   By: Minerva Fester M.D.   On: 02/26/2023 20:39   DG Chest Portable 1 View  Result Date: 02/26/2023 CLINICAL DATA:  Shortness of breath and pain EXAM: PORTABLE CHEST 1 VIEW COMPARISON:  08/25/2022 FINDINGS: Sternotomy and CABG. Stable cardiomediastinal silhouette. No focal consolidation, pleural effusion, or pneumothorax. No displaced rib fractures. IMPRESSION: No active disease. Electronically Signed   By: Minerva Fester M.D.   On: 02/26/2023 20:36    Microbiology: Results for orders placed or performed during the hospital encounter of 02/26/23  Culture, blood (Routine x 2)     Status: Abnormal   Collection Time: 02/26/23  4:30 PM   Specimen: BLOOD  Result Value Ref Range Status   Specimen Description   Final    BLOOD  R ARM Performed at [facility], [address] [place], [place] [address]    Special Requests   Final  BOTTLES DRAWN AEROBIC AND ANAEROBIC Blood Culture results may not be optimal due to an inadequate volume of blood received in culture bottles Performed at [facility], [address]  [place], [place] [address]    Culture  Setup Time   Final    GRAM POSITIVE COCCI IN BOTH AEROBIC AND ANAEROBIC BOTTLES Organism ID to follow CRITICAL RESULT CALLED TO, READ BACK BY AND VERIFIED WITH: NATHAN BELUE 02/27/23 0636 MW Performed at [facility], [address] [place], [place] [address]    Culture STAPHYLOCOCCUS AUREUS (A)  Final   Report Status 03/01/2023 FINAL  Final   Organism ID, Bacteria STAPHYLOCOCCUS AUREUS  Final      Susceptibility   Staphylococcus aureus - MIC*    CIPROFLOXACIN <=0.5 SENSITIVE Sensitive     ERYTHROMYCIN <=0.25 SENSITIVE Sensitive     GENTAMICIN <=0.5 SENSITIVE Sensitive     OXACILLIN <=0.25 SENSITIVE Sensitive     TETRACYCLINE <=1 SENSITIVE Sensitive     VANCOMYCIN 1 SENSITIVE Sensitive     TRIMETH/SULFA <=10 SENSITIVE Sensitive     CLINDAMYCIN <=0.25 SENSITIVE Sensitive     RIFAMPIN <=0.5 SENSITIVE Sensitive     Inducible Clindamycin NEGATIVE Sensitive     LINEZOLID 2 SENSITIVE Sensitive     * STAPHYLOCOCCUS AUREUS  Blood Culture ID Panel (Reflexed)     Status: Abnormal   Collection Time: 02/26/23  4:30 PM  Result Value Ref Range Status   Enterococcus faecalis NOT DETECTED NOT DETECTED Final   Enterococcus Faecium NOT DETECTED NOT DETECTED Final   Listeria monocytogenes NOT DETECTED NOT DETECTED Final   Staphylococcus species DETECTED (A) NOT DETECTED Final    Comment: CRITICAL RESULT CALLED TO, READ BACK BY AND VERIFIED WITH: NATHAN BELUE 02/27/23 0636 MW    Staphylococcus aureus (BCID) DETECTED (A) NOT DETECTED Final    Comment: CRITICAL RESULT CALLED TO, READ BACK BY AND VERIFIED WITH: NATHAN BELUE 02/27/23 0636 MW    Staphylococcus epidermidis NOT DETECTED NOT DETECTED Final   Staphylococcus lugdunensis NOT DETECTED NOT DETECTED Final   Streptococcus species NOT DETECTED NOT DETECTED Final   Streptococcus agalactiae NOT DETECTED NOT DETECTED Final   Streptococcus pneumoniae NOT DETECTED NOT DETECTED Final   Streptococcus  pyogenes NOT DETECTED NOT DETECTED Final   A.calcoaceticus-baumannii NOT DETECTED NOT DETECTED Final   Bacteroides fragilis NOT DETECTED NOT DETECTED Final   Enterobacterales NOT DETECTED NOT DETECTED Final   Enterobacter cloacae complex NOT DETECTED NOT DETECTED Final   Escherichia coli NOT DETECTED NOT DETECTED Final   Klebsiella aerogenes NOT DETECTED NOT DETECTED Final   Klebsiella oxytoca NOT DETECTED NOT DETECTED Final   Klebsiella pneumoniae NOT DETECTED NOT DETECTED Final   Proteus species NOT DETECTED NOT DETECTED Final   Salmonella species NOT DETECTED NOT DETECTED Final   Serratia marcescens NOT DETECTED NOT DETECTED Final   Haemophilus influenzae NOT DETECTED NOT DETECTED Final   Neisseria meningitidis NOT DETECTED NOT DETECTED Final   Pseudomonas aeruginosa NOT DETECTED NOT DETECTED Final   Stenotrophomonas maltophilia NOT DETECTED NOT DETECTED Final   Candida albicans NOT DETECTED NOT DETECTED Final   Candida auris NOT DETECTED NOT DETECTED Final   Candida glabrata NOT DETECTED NOT DETECTED Final   Candida krusei NOT DETECTED NOT DETECTED Final   Candida parapsilosis NOT DETECTED NOT DETECTED Final   Candida tropicalis NOT DETECTED NOT DETECTED Final   Cryptococcus neoformans/gattii NOT DETECTED NOT DETECTED Final   Meth resistant mecA/C and MREJ NOT DETECTED NOT DETECTED Final  Comment: Performed at [facility], [address] [place], [place] [address]  Culture, blood (Routine x 2)     Status: Abnormal   Collection Time: 02/26/23  4:31 PM   Specimen: BLOOD  Result Value Ref Range Status   Specimen Description   Final    BLOOD  L ARM Performed at [facility], [address], [place], [place] [address]    Special Requests   Final    BOTTLES DRAWN AEROBIC AND ANAEROBIC Blood Culture adequate volume Performed at [facility], [address], [place], [place] [address]    Culture  Setup Time   Final    GRAM POSITIVE COCCI IN BOTH  AEROBIC AND ANAEROBIC BOTTLES CRITICAL RESULT CALLED TO, READ BACK BY AND VERIFIED WITH: NATHAN BELUE 02/27/23 0636 MW GRAM STAIN REVIEWED-AGREE WITH RESULT Performed at [facility], [address] [place], [place] [address]    Culture (A)  Final    STAPHYLOCOCCUS AUREUS SUSCEPTIBILITIES PERFORMED ON PREVIOUS CULTURE WITHIN THE LAST 5 DAYS. Performed at [facility], [address], [place], [place] [address]    Report Status 03/01/2023 FINAL  Final  Aerobic/Anaerobic Culture w Gram Stain (surgical/deep wound)     Status: None   Collection Time: 02/27/23  3:08 PM   Specimen: Wound; Tissue  Result Value Ref Range Status   Specimen Description   Final    WOUND Performed at [facility], [address], [place], [place] [address]    Special Requests   Final    FOOT LEFT Performed at [facility], [address] [place], [place] [address]    Gram Stain   Final    FEW WBC PRESENT,BOTH PMN AND MONONUCLEAR FEW GRAM POSITIVE COCCI IN PAIRS RARE GRAM NEGATIVE RODS    Culture   Final    ABUNDANT STAPHYLOCOCCUS AUREUS ABUNDANT STREPTOCOCCUS MITIS/ORALIS NO ANAEROBES ISOLATED Performed at [facility], [address], [place], [place] [address]    Report Status 03/05/2023 FINAL  Final   Organism ID, Bacteria STAPHYLOCOCCUS AUREUS  Final   Organism ID, Bacteria STREPTOCOCCUS MITIS/ORALIS  Final      Susceptibility   Staphylococcus aureus - MIC*    CIPROFLOXACIN <=0.5 SENSITIVE Sensitive     ERYTHROMYCIN <=0.25 SENSITIVE Sensitive     GENTAMICIN <=0.5 SENSITIVE Sensitive     OXACILLIN <=0.25 SENSITIVE Sensitive     TETRACYCLINE <=1 SENSITIVE Sensitive     VANCOMYCIN 1 SENSITIVE Sensitive     TRIMETH/SULFA <=10 SENSITIVE Sensitive     CLINDAMYCIN <=0.25 SENSITIVE Sensitive     RIFAMPIN <=0.5 SENSITIVE Sensitive     Inducible Clindamycin NEGATIVE Sensitive     LINEZOLID 2 SENSITIVE Sensitive     * ABUNDANT STAPHYLOCOCCUS AUREUS    Streptococcus mitis/oralis - MIC*    TETRACYCLINE 0.5 SENSITIVE Sensitive     VANCOMYCIN 0.5 SENSITIVE Sensitive     CLINDAMYCIN <=0.25 SENSITIVE Sensitive     PENICILLIN Value in next row Sensitive      SENSITIVEMIC =0.06    * ABUNDANT STREPTOCOCCUS MITIS/ORALIS  Surgical PCR screen     Status: Abnormal   Collection Time: 02/27/23  4:17 PM   Specimen: Nasal Mucosa; Nasal Swab  Result Value Ref Range Status   MRSA, PCR NEGATIVE NEGATIVE Final   Staphylococcus aureus POSITIVE (A) NEGATIVE Final    Comment: (NOTE) The Xpert SA Assay (FDA approved for NASAL specimens in patients 21 years of age and older), is one component of  a comprehensive surveillance program. It is not intended to diagnose infection nor to guide or monitor treatment. Performed at [facility], [address] [place], [place] [address]   Aerobic/Anaerobic Culture w Gram Stain (surgical/deep wound)     Status: None   Collection Time: 02/28/23  4:22 PM   Specimen: Wound  Result Value Ref Range Status   Specimen Description   Final    WOUND Performed at [facility], [address], [place], [place] [address]    Special Requests   Final    LEFT FOOT Performed at [facility], [address] [place], [place] [address]    Gram Stain NO WBC SEEN NO ORGANISMS SEEN   Final   Culture   Final    RARE STAPHYLOCOCCUS AUREUS RARE STREPTOCOCCUS MITIS/ORALIS SUSCEPTIBILITIES PERFORMED ON PREVIOUS CULTURE WITHIN THE LAST 5 DAYS. NO ANAEROBES ISOLATED Performed at [facility], [address] [place], [place] [address]    Report Status 03/06/2023 FINAL  Final  Aerobic/Anaerobic Culture w Gram Stain (surgical/deep wound)     Status: None   Collection Time: 02/28/23  4:30 PM   Specimen: PATH Bone resection; Tissue  Result Value Ref Range Status   Specimen Description   Final    TISSUE Performed at [facility], [address], [place], [place] [address]    Special  Requests   Final    BONE RESRCT Performed at [facility], [address] [place], [place] [address]    Gram Stain NO WBC SEEN NO ORGANISMS SEEN   Final   Culture   Final    RARE STAPHYLOCOCCUS AUREUS RARE STREPTOCOCCUS MITIS/ORALIS NO ANAEROBES ISOLATED Performed at [facility], [address], [place], [place] [address]    Report Status 03/06/2023 FINAL  Final   Organism ID, Bacteria STAPHYLOCOCCUS AUREUS  Final   Organism ID, Bacteria STREPTOCOCCUS MITIS/ORALIS  Final      Susceptibility   Staphylococcus aureus - MIC*    CIPROFLOXACIN <=0.5 SENSITIVE Sensitive     ERYTHROMYCIN <=0.25 SENSITIVE Sensitive     GENTAMICIN <=0.5 SENSITIVE Sensitive     OXACILLIN <=0.25 SENSITIVE Sensitive     TETRACYCLINE <=1 SENSITIVE Sensitive     VANCOMYCIN <=0.5 SENSITIVE Sensitive     TRIMETH/SULFA <=10 SENSITIVE Sensitive     CLINDAMYCIN <=0.25 SENSITIVE Sensitive     RIFAMPIN <=0.5 SENSITIVE Sensitive     Inducible Clindamycin NEGATIVE Sensitive     LINEZOLID 2 SENSITIVE Sensitive     * RARE STAPHYLOCOCCUS AUREUS   Streptococcus mitis/oralis - MIC*    TETRACYCLINE 0.5 SENSITIVE Sensitive     VANCOMYCIN 0.5 SENSITIVE Sensitive     CLINDAMYCIN <=0.25 SENSITIVE Sensitive     PENICILLIN Value in next row Sensitive      SENSITIVEMIC =0.06    * RARE STREPTOCOCCUS MITIS/ORALIS  Culture, blood (Routine X 2) w Reflex to ID Panel     Status: None   Collection Time: 03/01/23  9:02 AM   Specimen: BLOOD  Result Value Ref Range Status   Specimen Description BLOOD BLOOD LEFT HAND  Final   Special Requests   Final    BOTTLES DRAWN AEROBIC AND ANAEROBIC Blood Culture adequate volume   Culture   Final    NO GROWTH 5 DAYS Performed at [facility], [address], [place], [place] [address]    Report Status 03/06/2023 FINAL  Final  Culture, blood (Routine X 2) w Reflex to ID Panel  Status: None   Collection Time: 03/01/23  9:08 AM   Specimen: BLOOD  Result Value  Ref Range Status   Specimen Description BLOOD BLOOD RIGHT HAND  Final   Special Requests   Final    BOTTLES DRAWN AEROBIC AND ANAEROBIC Blood Culture adequate volume   Culture   Final    NO GROWTH 5 DAYS Performed at [facility], [address] [place], [place] [address]    Report Status 03/06/2023 FINAL  Final    Labs: CBC: Recent Labs  Lab 03/01/23 0441 03/05/23 0557  WBC 7.2 7.2  HGB 10.0* 11.2*  HCT 29.3* 33.2*  MCV 85.7 86.7  PLT 285 339   Basic Metabolic Panel: Recent Labs  Lab 03/01/23 0441 03/05/23 0557  NA 132* 136  K 4.0 4.0  CL 101 103  CO2 24 24  GLUCOSE 265* 158*  BUN 14 17  CREATININE 1.28* 1.23  CALCIUM 8.2* 8.5*   Liver Function Tests: No results for input(s): "AST", "ALT", "ALKPHOS", "BILITOT", "PROT", "ALBUMIN" in the last 168 hours.  CBG: Recent Labs  Lab 03/04/23 1219 03/04/23 1639 03/04/23 2122 03/05/23 0821 03/05/23 1152  GLUCAP 212* 210* 199* 141* 119*    Discharge time spent: greater than 30 minutes.  Signed: Pennie Banter, DO [facility] 03/06/2023

## 2023-03-05 NOTE — TOC Transition Note (Signed)
Transition of Care [facility]) - CM/SW Discharge Note   Patient Details  Name: Andrew Harding MRN: [id number] Date of Birth: [birth year]  [facility] CM/SW Contact:  Margarito Liner, LCSW Phone Number: 03/05/2023, 11:20 AM   Clinical Narrative:  Patient has orders to discharge home today. No further concerns. CSW signing off.   Final next level of care: Home/Self Care Barriers to Discharge: Barriers Resolved   Patient Goals and CMS Choice      Discharge Placement                  Patient to be transferred to facility by: Friend   Patient and family notified of of transfer: 03/05/23  Discharge Plan and Services Additional resources added to the After Visit Summary for                                       Social Determinants of Health (SDOH) Interventions SDOH Screenings   Food Insecurity: No Food Insecurity (02/27/2023)  Housing: Low Risk  (02/27/2023)  Transportation Needs: No Transportation Needs (02/27/2023)  Utilities: Not At Risk (02/27/2023)  Depression (PHQ2-9): Low Risk  (09/14/2022)  Financial Resource Strain: Low Risk  (12/13/2022)   Received from [facility]  Tobacco Use: Medium Risk (03/01/2023)     Readmission Risk Interventions    02/28/2023   12:53 PM  Readmission Risk Prevention Plan  Transportation Screening Complete  PCP or Specialist Appt within 3-5 Days Complete  HRI or Home Care Consult Complete  Social Work Consult for Recovery Care Planning/Counseling Complete  Palliative Care Screening Not Applicable  Medication Review Ocean[employer]) Complete

## 2023-03-05 NOTE — Progress Notes (Signed)
Pharmacy - Antimicrobial Stewardship  Per ID plan will be cephalexin 1gm PO 4x/day x 4 weeks and dalbavancin 11/5 and 11/12 at Same Day surgery. Patient does not wish to have IV antibiotics at home.    Patient aware of 10am appointment tomorrow at Same Day surgery to receive IV dalbavancin.    Cephelexin prescription given to patient prior to discharge.  Reviewed how to take medication and possible side effects.     Juliette Alcide, PharmD, BCPS, BCIDP Work Cell: [phone] 03/05/2023 1:04 PM

## 2023-03-06 ENCOUNTER — Ambulatory Visit
Admit: 2023-03-06 | Discharge: 2023-03-06 | Disposition: A | Discharge status: Home / Self Care | Payer: Managed Care, Other (non HMO) | Attending: Infectious Diseases | Admitting: Infectious Diseases

## 2023-03-06 ENCOUNTER — Encounter: Payer: Self-pay | Admitting: Internal Medicine

## 2023-03-06 DIAGNOSIS — M8610 Other acute osteomyelitis, unspecified site: Secondary | ICD-10-CM | POA: Insufficient documentation

## 2023-03-06 LAB — CULTURE, BLOOD (ROUTINE X 2)
Culture: NO GROWTH
Culture: NO GROWTH
Special Requests: ADEQUATE
Special Requests: ADEQUATE

## 2023-03-06 LAB — AEROBIC/ANAEROBIC CULTURE W GRAM STAIN (SURGICAL/DEEP WOUND)
Gram Stain: NONE SEEN
Gram Stain: NONE SEEN

## 2023-03-06 MED ORDER — DEXTROSE 5 % IV SOLN
1500.0000 mg | Freq: Once | INTRAVENOUS | Status: AC
  Administered 2023-03-06: 1500 mg via INTRAVENOUS
  Filled 2023-03-06: qty 75

## 2023-03-09 ENCOUNTER — Other Ambulatory Visit
Admission: RE | Admit: 2023-03-09 | Discharge: 2023-03-09 | Disposition: A | Discharge status: Home / Self Care | Payer: Managed Care, Other (non HMO) | Attending: Infectious Diseases | Admitting: Infectious Diseases

## 2023-03-09 DIAGNOSIS — M8610 Other acute osteomyelitis, unspecified site: Secondary | ICD-10-CM | POA: Diagnosis present

## 2023-03-09 LAB — CBC WITH DIFFERENTIAL/PLATELET
Abs Immature Granulocytes: 0.04 10*3/uL (ref 0.00–0.07)
Basophils Absolute: 0 10*3/uL (ref 0.0–0.1)
Basophils Relative: 0 %
Eosinophils Absolute: 0.2 10*3/uL (ref 0.0–0.5)
Eosinophils Relative: 3 %
HCT: 37.5 % — ABNORMAL LOW (ref 39.0–52.0)
Hemoglobin: 12.3 g/dL — ABNORMAL LOW (ref 13.0–17.0)
Immature Granulocytes: 1 %
Lymphocytes Relative: 17 %
Lymphs Abs: 1 10*3/uL (ref 0.7–4.0)
MCH: 29.4 pg (ref 26.0–34.0)
MCHC: 32.8 g/dL (ref 30.0–36.0)
MCV: 89.7 fL (ref 80.0–100.0)
Monocytes Absolute: 0.2 10*3/uL (ref 0.1–1.0)
Monocytes Relative: 4 %
Neutro Abs: 4.5 10*3/uL (ref 1.7–7.7)
Neutrophils Relative %: 75 %
Platelets: 331 10*3/uL (ref 150–400)
RBC: 4.18 MIL/uL — ABNORMAL LOW (ref 4.22–5.81)
RDW: 12.9 % (ref 11.5–15.5)
WBC: 5.9 10*3/uL (ref 4.0–10.5)
nRBC: 0 % (ref 0.0–0.2)

## 2023-03-09 LAB — COMPREHENSIVE METABOLIC PANEL
ALT: 14 U/L (ref 0–44)
AST: 32 U/L (ref 15–41)
Albumin: 3.6 g/dL (ref 3.5–5.0)
Alkaline Phosphatase: 67 U/L (ref 38–126)
Anion gap: 12 (ref 5–15)
BUN: 13 mg/dL (ref 8–23)
CO2: 24 mmol/L (ref 22–32)
Calcium: 9 mg/dL (ref 8.9–10.3)
Chloride: 101 mmol/L (ref 98–111)
Creatinine, Ser: 1.24 mg/dL (ref 0.61–1.24)
GFR, Estimated: >60 mL/min (ref >60)
Glucose, Bld: 214 mg/dL — ABNORMAL HIGH (ref 70–99)
Potassium: 4 mmol/L (ref 3.5–5.1)
Sodium: 137 mmol/L (ref 135–145)
Total Bilirubin: 0.3 mg/dL (ref <1.2)
Total Protein: 7.6 g/dL (ref 6.5–8.1)

## 2023-03-13 ENCOUNTER — Ambulatory Visit
Admission: RE | Admit: 2023-03-13 | Discharge: 2023-03-13 | Disposition: A | Discharge status: Home / Self Care | Payer: Managed Care, Other (non HMO) | Source: Ambulatory Visit | Attending: Infectious Diseases | Admitting: Infectious Diseases

## 2023-03-13 VITALS — BP 117/73 | HR 83 | Temp 97.0°F | Resp 15 | Ht 75.0 in | Wt 216.1 lb

## 2023-03-13 DIAGNOSIS — R7881 Bacteremia: Secondary | ICD-10-CM | POA: Insufficient documentation

## 2023-03-13 DIAGNOSIS — B9561 Methicillin susceptible Staphylococcus aureus infection as the cause of diseases classified elsewhere: Secondary | ICD-10-CM | POA: Insufficient documentation

## 2023-03-13 MED ORDER — DEXTROSE 5 % IV SOLN
1500.0000 mg | INTRAVENOUS | Status: DC
  Administered 2023-03-13: 1500 mg via INTRAVENOUS
  Filled 2023-03-13: qty 75

## 2023-03-13 NOTE — Progress Notes (Signed)
Patient stated that last infusion of Dalbavancin caused lower back pain. Infusion was slowed down and pain subsided. Patient requested that infusion be decreased this time, infusing at 900 mL/hr and patient tolerating well.

## 2023-03-16 ENCOUNTER — Other Ambulatory Visit: Payer: Self-pay

## 2023-03-16 ENCOUNTER — Other Ambulatory Visit
Admission: RE | Admit: 2023-03-16 | Discharge: 2023-03-16 | Disposition: A | Discharge status: Home / Self Care | Payer: Managed Care, Other (non HMO) | Attending: Infectious Diseases | Admitting: Infectious Diseases

## 2023-03-16 DIAGNOSIS — M8610 Other acute osteomyelitis, unspecified site: Secondary | ICD-10-CM | POA: Insufficient documentation

## 2023-03-16 LAB — CBC WITH DIFFERENTIAL/PLATELET
Abs Immature Granulocytes: 0.03 10*3/uL (ref 0.00–0.07)
Basophils Absolute: 0 10*3/uL (ref 0.0–0.1)
Basophils Relative: 1 %
Eosinophils Absolute: 0.3 10*3/uL (ref 0.0–0.5)
Eosinophils Relative: 4 %
HCT: 36 % — ABNORMAL LOW (ref 39.0–52.0)
Hemoglobin: 11.7 g/dL — ABNORMAL LOW (ref 13.0–17.0)
Immature Granulocytes: 1 %
Lymphocytes Relative: 15 %
Lymphs Abs: 1 10*3/uL (ref 0.7–4.0)
MCH: 28.9 pg (ref 26.0–34.0)
MCHC: 32.5 g/dL (ref 30.0–36.0)
MCV: 88.9 fL (ref 80.0–100.0)
Monocytes Absolute: 0.4 10*3/uL (ref 0.1–1.0)
Monocytes Relative: 6 %
Neutro Abs: 4.7 10*3/uL (ref 1.7–7.7)
Neutrophils Relative %: 73 %
Platelets: 209 10*3/uL (ref 150–400)
RBC: 4.05 MIL/uL — ABNORMAL LOW (ref 4.22–5.81)
RDW: 13.3 % (ref 11.5–15.5)
WBC: 6.3 10*3/uL (ref 4.0–10.5)
nRBC: 0 % (ref 0.0–0.2)

## 2023-03-16 LAB — COMPREHENSIVE METABOLIC PANEL
ALT: 10 U/L (ref 0–44)
AST: 19 U/L (ref 15–41)
Albumin: 3.6 g/dL (ref 3.5–5.0)
Alkaline Phosphatase: 56 U/L (ref 38–126)
Anion gap: 9 (ref 5–15)
BUN: 17 mg/dL (ref 8–23)
CO2: 26 mmol/L (ref 22–32)
Calcium: 8.8 mg/dL — ABNORMAL LOW (ref 8.9–10.3)
Chloride: 104 mmol/L (ref 98–111)
Creatinine, Ser: 1.45 mg/dL — ABNORMAL HIGH (ref 0.61–1.24)
GFR, Estimated: 51 mL/min — ABNORMAL LOW (ref >60)
Glucose, Bld: 204 mg/dL — ABNORMAL HIGH (ref 70–99)
Potassium: 4.7 mmol/L (ref 3.5–5.1)
Sodium: 139 mmol/L (ref 135–145)
Total Bilirubin: 0.4 mg/dL (ref <1.2)
Total Protein: 7.2 g/dL (ref 6.5–8.1)

## 2023-03-16 NOTE — Addendum Note (Signed)
Addended by: Lonell Face C on: 03/16/2023 10:09 AM   Modules accepted: Orders

## 2023-04-04 NOTE — Addendum Note (Signed)
Encounter addended by: Rosalita Chessman, RN on: 04/04/2023 10:41 AM  Actions taken: Charge Capture section accepted

## 2023-04-05 ENCOUNTER — Other Ambulatory Visit
Admission: RE | Admit: 2023-04-05 | Discharge: 2023-04-05 | Disposition: A | Discharge status: Home / Self Care | Payer: Managed Care, Other (non HMO) | Source: Ambulatory Visit | Attending: Infectious Diseases | Admitting: Infectious Diseases

## 2023-04-05 ENCOUNTER — Ambulatory Visit: Payer: Managed Care, Other (non HMO) | Attending: Infectious Diseases | Admitting: Infectious Diseases

## 2023-04-05 ENCOUNTER — Encounter: Payer: Self-pay | Admitting: Infectious Diseases

## 2023-04-05 VITALS — BP 125/78 | HR 80 | Temp 97.5°F | Ht 75.0 in | Wt 220.0 lb

## 2023-04-05 DIAGNOSIS — E1142 Type 2 diabetes mellitus with diabetic polyneuropathy: Secondary | ICD-10-CM | POA: Insufficient documentation

## 2023-04-05 DIAGNOSIS — Z7901 Long term (current) use of anticoagulants: Secondary | ICD-10-CM | POA: Insufficient documentation

## 2023-04-05 DIAGNOSIS — Z951 Presence of aortocoronary bypass graft: Secondary | ICD-10-CM | POA: Diagnosis not present

## 2023-04-05 DIAGNOSIS — Z79899 Other long term (current) drug therapy: Secondary | ICD-10-CM | POA: Insufficient documentation

## 2023-04-05 DIAGNOSIS — M86172 Other acute osteomyelitis, left ankle and foot: Secondary | ICD-10-CM | POA: Diagnosis not present

## 2023-04-05 DIAGNOSIS — L97529 Non-pressure chronic ulcer of other part of left foot with unspecified severity: Secondary | ICD-10-CM | POA: Diagnosis not present

## 2023-04-05 DIAGNOSIS — L089 Local infection of the skin and subcutaneous tissue, unspecified: Secondary | ICD-10-CM

## 2023-04-05 DIAGNOSIS — N189 Chronic kidney disease, unspecified: Secondary | ICD-10-CM | POA: Insufficient documentation

## 2023-04-05 DIAGNOSIS — Z87891 Personal history of nicotine dependence: Secondary | ICD-10-CM | POA: Insufficient documentation

## 2023-04-05 DIAGNOSIS — R7881 Bacteremia: Secondary | ICD-10-CM | POA: Diagnosis present

## 2023-04-05 DIAGNOSIS — E1122 Type 2 diabetes mellitus with diabetic chronic kidney disease: Secondary | ICD-10-CM | POA: Diagnosis not present

## 2023-04-05 DIAGNOSIS — Z7985 Long-term (current) use of injectable non-insulin antidiabetic drugs: Secondary | ICD-10-CM | POA: Insufficient documentation

## 2023-04-05 DIAGNOSIS — I129 Hypertensive chronic kidney disease with stage 1 through stage 4 chronic kidney disease, or unspecified chronic kidney disease: Secondary | ICD-10-CM | POA: Insufficient documentation

## 2023-04-05 DIAGNOSIS — A4901 Methicillin susceptible Staphylococcus aureus infection, unspecified site: Secondary | ICD-10-CM | POA: Insufficient documentation

## 2023-04-05 DIAGNOSIS — I2510 Atherosclerotic heart disease of native coronary artery without angina pectoris: Secondary | ICD-10-CM | POA: Insufficient documentation

## 2023-04-05 DIAGNOSIS — I4891 Unspecified atrial fibrillation: Secondary | ICD-10-CM | POA: Insufficient documentation

## 2023-04-05 DIAGNOSIS — B9561 Methicillin susceptible Staphylococcus aureus infection as the cause of diseases classified elsewhere: Secondary | ICD-10-CM | POA: Insufficient documentation

## 2023-04-05 LAB — CBC WITH DIFFERENTIAL/PLATELET
Abs Immature Granulocytes: 0.04 10*3/uL (ref 0.00–0.07)
Basophils Absolute: 0 10*3/uL (ref 0.0–0.1)
Basophils Relative: 1 %
Eosinophils Absolute: 0.2 10*3/uL (ref 0.0–0.5)
Eosinophils Relative: 3 %
HCT: 37.7 % — ABNORMAL LOW (ref 39.0–52.0)
Hemoglobin: 12.3 g/dL — ABNORMAL LOW (ref 13.0–17.0)
Immature Granulocytes: 1 %
Lymphocytes Relative: 14 %
Lymphs Abs: 1.2 10*3/uL (ref 0.7–4.0)
MCH: 29.6 pg (ref 26.0–34.0)
MCHC: 32.6 g/dL (ref 30.0–36.0)
MCV: 90.6 fL (ref 80.0–100.0)
Monocytes Absolute: 0.6 10*3/uL (ref 0.1–1.0)
Monocytes Relative: 7 %
Neutro Abs: 6.7 10*3/uL (ref 1.7–7.7)
Neutrophils Relative %: 74 %
Platelets: 232 10*3/uL (ref 150–400)
RBC: 4.16 MIL/uL — ABNORMAL LOW (ref 4.22–5.81)
RDW: 14 % (ref 11.5–15.5)
WBC: 8.8 10*3/uL (ref 4.0–10.5)
nRBC: 0 % (ref 0.0–0.2)

## 2023-04-05 LAB — COMPREHENSIVE METABOLIC PANEL
ALT: 16 U/L (ref 0–44)
AST: 21 U/L (ref 15–41)
Albumin: 4.2 g/dL (ref 3.5–5.0)
Alkaline Phosphatase: 63 U/L (ref 38–126)
Anion gap: 10 (ref 5–15)
BUN: 20 mg/dL (ref 8–23)
CO2: 25 mmol/L (ref 22–32)
Calcium: 9 mg/dL (ref 8.9–10.3)
Chloride: 103 mmol/L (ref 98–111)
Creatinine, Ser: 1.4 mg/dL — ABNORMAL HIGH (ref 0.61–1.24)
GFR, Estimated: 53 mL/min — ABNORMAL LOW (ref >60)
Glucose, Bld: 126 mg/dL — ABNORMAL HIGH (ref 70–99)
Potassium: 4.8 mmol/L (ref 3.5–5.1)
Sodium: 138 mmol/L (ref 135–145)
Total Bilirubin: 0.8 mg/dL (ref <1.2)
Total Protein: 8 g/dL (ref 6.5–8.1)

## 2023-04-05 LAB — SEDIMENTATION RATE: Sed Rate: 41 mm/h — ABNORMAL HIGH (ref 0–20)

## 2023-04-05 LAB — C-REACTIVE PROTEIN: CRP: 2 mg/dL — ABNORMAL HIGH (ref <1.0)

## 2023-04-05 MED ORDER — CEFADROXIL 500 MG PO CAPS: 1000.0000 mg | ORAL_CAPSULE | Freq: Two times a day (BID) | ORAL | 0 refills | Status: DC

## 2023-04-05 NOTE — Patient Instructions (Signed)
You came in today for a follow-up on your foot infection after completing a course of Dalbavancin and oral antibiotics. You mentioned that the infection is healing slowly, with occasional yellowish and reddish-yellow discharge, especially after debridement. You are not experiencing any pain or discomfort and are able to manage your daily activities and work. Dr. Ether Griffins has been managing your wound, and the stitches have not yet been removed. You also have a wound on the bottom of your foot from a previous procedure and have been using special shoes to accommodate the bandages and prevent pressure on the wounds.  YOUR PLAN:  -FOOT INFECTION: A foot infection occurs when bacteria enter the skin, causing redness, swelling, and sometimes discharge. Your infection is healing slowly, and there are no current signs of active infection. We will order labs to check your kidney and liver function. Based on the lab results, you will be prescribed either 1 or 2 pills of a new antibiotic to be taken twice daily for 4 weeks. The prescription will be called into [facility], and you will be informed of the dosage. Please schedule a follow-up appointment in 4 weeks.  -FOOTWEAR: Special footwear is necessary to accommodate bandages and prevent pressure on your wounds. You have special arches and are awaiting additional ordered items. It has been noted that you have difficulty fitting your bandaged foot into a shoe. We advise you to consider using an open-toe boot or rocker bottom shoe to avoid pressure on the toe part of your foot.  INSTRUCTIONS:  Please schedule a follow-up appointment in 4 weeks. We will call your prescription into [facility] and inform you of the dosage based on your lab results.

## 2023-04-05 NOTE — Progress Notes (Signed)
NAME: Andrew Harding  DOB: 25-Jun-1950  MRN: [id number]  Date/Time: 04/05/2023 10:58 AM   Here for follow up after recent hospitalization for left foot infection and MSSA bacteremia 02/26/23-03/05/23  ? Andrew Harding is a 72 y.o. 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 He works at the [employer] and while at work he climbed [employer] of stairs and was very sob and was asked to seek medical help. So he came to the ED Blood culture positive for MSSAand place don IV cefazolin  and MRI showed OM of left firt metatarsal with necrotic ulcer. He underwent excision of bone with delayed primary closure on 02/28/23 by Dr.Fowler. culture was MSSA and streptococcus oralis TEE on 10/31 NO vegetation 10/31 repeat BC- NG Bone pathology acute osteomyelitis with proximal margin involvement We recommended 6 weeks of IV antibiotic but patient did not want IV at home. He was very particular about oral antibiotic He wanted to go back to work as soon as he could He received 2 does of weekly Dalbavancin on 11/5 and 03/13/23 HE was discharged on 11/4 with keflex 1 gram PO q 6 for 4weeks He is here for follow up He has gone back to work more than a week ago He completed kelfex on 04/02/23 HE saw Dr.Fowler- sutures have not been removed He has some drainage from the surgical site No fever or chills or pain  Past Medical History:  Diagnosis Date   AKI (acute kidney injury) (HCC) 09/05/2022   Atrial fibrillation with RVR (HCC) 08/19/2022   Bladder neck obstruction    Chronic kidney disease    Coronary artery disease    a.) s/p 4v CABG in 2014   Diabetes mellitus  without complication (HCC)    Diabetic neuropathy (HCC)    Diabetic peripheral neuropathy (HCC)    Diverticulosis    Gout    Heart murmur    Hypercholesteremia    Hyperlipidemia    Hypertension    Peripheral neuropathy    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   Sepsis (HCC) 08/17/2022   Tubular adenoma    Vitamin D deficiency     Past Surgical History:  Procedure Laterality Date   AMPUTATION Left 08/19/2022   Procedure: TRANSMETATARSAL AMPUTATION LEFT FOOT WITH IRRIGATION AND DEBRIDEMENT;  Surgeon: Rosetta Posner, DPM;  Location: [facility] ORS;  Service: Podiatry;  Laterality: Left;   AMPUTATION TOE Right 06/01/2015   Procedure: AMPUTATION TOE;  Surgeon: Recardo Evangelist, DPM;  Location: [facility];  Service: Podiatry;  Laterality: Right;   AMPUTATION TOE Left 08/11/2022   Procedure: AMPUTATION TOE 2, 3, 4;  Surgeon: [name], DPM;  Location: [facility];  Service: Podiatry;  Laterality: Left;   CATARACT EXTRACTION, BILATERAL     CIRCUMCISION N/A 06/12/2022   Procedure: CIRCUMCISION ADULT;  Surgeon: Vanna Scotland, MD;  Location: [facility];  Service: Urology;  Laterality: N/A;   COLONOSCOPY WITH PROPOFOL N/A 02/14/2016   Procedure: COLONOSCOPY WITH PROPOFOL;  Surgeon: Christena Deem, MD;  Location: [facility] END[facility]COPY;  Service: Endoscopy;  Laterality: N/A;   COLONOSCOPY WITH PROPOFOL N/A 01/07/2019   Procedure: COLONOSCOPY WITH PROPOFOL;  Surgeon: Christena Deem, MD;  Location: [facility] ENDOSCOPY;  Service: Endoscopy;  Laterality: N/A;   CORONARY ARTERY BYPASS GRAFT N/A 08/2012   EXCISION PARTIAL PHALANX Right 06/01/2015   Procedure: EXCISION PARTIAL PHALANX /  BONE;  Surgeon: Recardo Evangelist, DPM;  Location: [facility];  Service: Podiatry;  Laterality: Right;   FLEXIBLE SIGMOIDOSCOPY N/A 05/29/2016   Procedure: FLEXIBLE SIGMOIDOSCOPY;  Surgeon: Christena Deem, MD;  Location: [facility] ENDOSCOPY;  Service: Endoscopy;  Laterality: N/A;   INCISION AND DRAINAGE Left 08/23/2022   Procedure: INCISION AND DRAINAGE;   Surgeon: [name], DPM;  Location: [facility];  Service: Podiatry;  Laterality: Left;   INCISION AND DRAINAGE OF WOUND Left 09/15/2022   Procedure: 11044 - DEBRIDE BONE and EXCISION IF 1ST METATARSAL BONE WITH  DELAY PRIMARY CLOSURE;  Surgeon: [name], DPM;  Location: [facility];  Service: Podiatry;  Laterality: Left;   IRRIGATION AND DEBRIDEMENT FOOT Left 02/28/2023   Procedure: IRRIGATION AND DEBRIDEMENT FOOT;  Surgeon: [name], DPM;  Location: [facility];  Service: Orthopedics/Podiatry;  Laterality: Left;   KNEE ARTHROSCOPY Left    LOWER EXTREMITY ANGIOGRAPHY Left 08/22/2022   Procedure: Lower Extremity Angiography;  Surgeon: Renford Dills, MD;  Location: [facility] INVASIVE CV LAB;  Service: Cardiovascular;  Laterality: Left;   TEE WITHOUT CARDIOVERSION N/A 03/01/2023   Procedure: TRANSESOPHAGEAL ECHOCARDIOGRAM;  Surgeon: Alluri, Meryl Dare, MD;  Location: [facility];  Service: Cardiovascular;  Laterality: N/A;   WOUND DEBRIDEMENT Left 09/15/2022   Procedure: 11043 - DEBRIDE SKIN. MUSCLE FASCIA;  Surgeon: [name], DPM;  Location: [facility];  Service: Podiatry;  Laterality: Left;    Social History   Socioeconomic History   Marital status: Divorced    Spouse name: Ashley,Angela C   Number of children: Not on file   Years of education: Not on file   Highest education level: Not on file  Occupational History   Not on file  Tobacco Use   Smoking status: Former    Types: Cigars   Smokeless tobacco: Never  Vaping Use   Vaping status: Never Used  Substance and Sexual Activity   Alcohol use: No   Drug use: No   Sexual activity: Never  Other Topics Concern   Not on file  Social History Narrative   Patient is legally separated and lives at home by himself. His ex -wife is his HCPOA. Neighbors and friends are his support system. He used to work for [employer]    Social Determinants of Health   Financial Resource Strain: Low Risk  (12/13/2022)    [facility] from [facility]   Overall Financial Resource Strain (CARDIA)    Difficulty of Paying Living Expenses: Not hard at all  Food Insecurity: No Food Insecurity (02/27/2023)   Hunger Vital Sign    Worried About Running Out of Food in the Last Year: Never true    Ran Out of Food in the Last Year: Never true  Transportation Needs: No Transportation Needs (02/27/2023)   PRAPARE - Administrator, Civil Service (Medical): No    Lack of Transportation (Non-Medical): No  Physical Activity: Not on file  Stress: Not on file  Social Connections: Not on file  Intimate Partner Violence: Not At Risk (02/27/2023)   Humiliation, Afraid, Rape, and Kick questionnaire    Fear of Current or Ex-Partner: No    Emotionally Abused: No    Physically Abused: No    Sexually Abused: No    Family  History  Problem Relation Age of Onset   Diabetes Mellitus II Mother    CAD Mother    No Known Allergies I? Current Outpatient Medications  Medication Sig Dispense Refill   acetaminophen (TYLENOL) 325 MG tablet Take 1-2 tablets (325-650 mg total) by mouth every 4 (four) hours as needed for mild pain.     apixaban (ELIQUIS) 5 MG TABS tablet Take 1 tablet (5 mg total) by mouth 2 (two) times daily. 60 tablet 5   Bacillus Coagulans-Inulin (ALIGN PREBIOTIC-PROBIOTIC) 5-1.25 MG-GM CHEW Chew 1 tablet by mouth as directed.     clopidogrel (PLAVIX) 75 MG tablet Take 1 tablet (75 mg total) by mouth daily with breakfast. 30 tablet 5   Continuous Glucose Sensor (FREESTYLE LIBRE 2 SENSOR) MISC      docusate sodium (COLACE) 100 MG capsule Take 1 capsule (100 mg total) by mouth 2 (two) times daily. 100 capsule 0   gabapentin (NEURONTIN) 300 MG capsule Take 1 capsule (300 mg total) by mouth 3 (three) times daily. 90 capsule 0   glucose blood (ONETOUCH ULTRA) test strip Use 3 (three) times daily     insulin glargine (LANTUS) 100 UNIT/ML Solostar Pen Inject 36 Units into the skin at bedtime.  (Patient taking differently: Inject 26 Units into the skin at bedtime.) 15 mL 11   Insulin Pen Needle (FIFTY50 PEN NEEDLES) 32G X 4 MM MISC USE 4 TIMES DAILY     metFORMIN (GLUCOPHAGE-XR) 500 MG 24 hr tablet Take 2,000 mg by mouth every evening. Take with dinner.     metoprolol tartrate (LOPRESSOR) 25 MG tablet Take 1 tablet (25 mg total) by mouth 2 (two) times daily. 60 tablet 0   ondansetron (ZOFRAN) 4 MG tablet Take 1 tablet (4 mg total) by mouth every 6 (six) hours as needed for nausea or vomiting. 30 tablet 2   OZEMPIC, 0.25 OR 0.5 MG/DOSE, 2 MG/3ML SOPN Inject 0.75 mLs into the skin once a week.     pravastatin (PRAVACHOL) 80 MG tablet TAKE ONE TABLET BY MOUTH AT NIGHT     traZODone (DESYREL) 50 MG tablet TAKE ONE TABLET AT BEDTIME AS NEEDED     No current facility-administered medications for this visit.     Abtx:  Anti-infectives (From admission, onward)    None       REVIEW OF SYSTEMS:  Const: negative fever, negative chills, negative weight loss Eyes: negative diplopia or visual changes, negative eye pain ENT: negative coryza, negative sore throat Resp: negative cough, hemoptysis, dyspnea Cards: negative for chest pain, palpitations, lower extremity edema GU: negative for frequency, dysuria and hematuria GI: Negative for abdominal pain, diarrhea, bleeding, constipation Skin: negative for rash and pruritus Heme: negative for easy bruising and gum/nose bleeding MS: as above Neurolo:negative for headaches, dizziness, vertigo, memory problems  Psych: negative for feelings of anxiety, depression  Endocrine:  diabetes Allergy/Immunology- negative for any medication or food allergies ? Pertinent Positives include : Objective:  VITALS:  BP 125/78   Pulse 80   Temp (!) 97.5 F (36.4 C) (Temporal)   Ht 6\' 3"  (1.905 m)   Wt 220 lb (99.8 kg)   BMI 27.50 kg/m   PHYSICAL EXAM:  General: Alert, cooperative, no distress, appears stated age.  Head: Normocephalic, without  obvious abnormality, atraumatic. Eyes: Conjunctivae clear, anicteric sclerae. Pupils are equal ENT Nares normal. No drainage or sinus tenderness. Lips, mucosa, and tongue normal. No Thrush Neck: Supple, symmetrical, no adenopathy, thyroid: non tender no carotid bruit and no JVD.  Back: No CVA tenderness. Lungs: Clear to auscultation bilaterally. No Wheezing or Rhonchi. No rales. Heart: Regular rate and rhythm, no murmur, rub or gallop. Abdomen: Soft, non-tender,not distended. Bowel sounds normal. No masses Extremities: left foot  Old TMA Noew surgical site has 3 sutures Some swelling      Ulcer on the bottom Skin: No rashes or lesions. Or bruising Lymph: Cervical, supraclavicular normal. Neurologic: Grossly non-focal Pertinent Labs Lab Results CBC    Component Value Date/Time   WBC 6.3 03/16/2023 1028   RBC 4.05 (L) 03/16/2023 1028   HGB 11.7 (L) 03/16/2023 1028   HCT 36.0 (L) 03/16/2023 1028   PLT 209 03/16/2023 1028   MCV 88.9 03/16/2023 1028   MCH 28.9 03/16/2023 1028   MCHC 32.5 03/16/2023 1028   RDW 13.3 03/16/2023 1028   LYMPHSABS 1.0 03/16/2023 1028   MONOABS 0.4 03/16/2023 1028   EOSABS 0.3 03/16/2023 1028   BASOSABS 0.0 03/16/2023 1028       Latest Ref Rng & Units 03/16/2023   10:28 AM 03/09/2023   10:06 AM 03/05/2023    5:57 AM  CMP  Glucose 70 - 99 mg/dL 161  096  045   BUN 8 - 23 mg/dL 17  13  17    Creatinine 0.61 - 1.24 mg/dL 4.09  8.11  9.14   Sodium 135 - 145 mmol/L 139  137  136   Potassium 3.5 - 5.1 mmol/L 4.7  4.0  4.0   Chloride 98 - 111 mmol/L 104  101  103   CO2 22 - 32 mmol/L 26  24  24    Calcium 8.9 - 10.3 mg/dL 8.8  9.0  8.5   Total Protein 6.5 - 8.1 g/dL 7.2  7.6    Total Bilirubin <1.2 mg/dL 0.4  0.3    Alkaline Phos 38 - 126 U/L 56  67    AST 15 - 41 U/L 19  32    ALT 0 - 44 U/L 10  14       ? Impression/Recommendation ?MSSA bacteremia secondary to left foot infection with ulcer Acute osteomyelitis of the foot with proximal  margin showing osteo 'TEE negative for any endocaritis Repeat blood culture neg Pt did not want to take IV antibiotic at home on discharge- He got a week of cefazolin and was  switched to Po Keflex 1 gram PO Q6( for weeks _) .  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 surface Osteomyelitis Underwent ffirst metatarsal resection and delayed primary closure on 02/28/23 Also underwent full thickness excisional debridement left medial arch   CAD status post CABG   A-fib on Eliquis and metoprolol  CKD     Previous infection of the left foot after thermal injury leading to TMA and Enterobacter and Enterococcus infection and he got 4 weeks of IV meropenem followed by 2 weeks of oral antibiotic earlier this year   Discussed the management with patient  Labs today- cbc/cmp/esr/crp Follow up 1 month ?will do labs again  in  2 weeks  Note:  This document was prepared using Dragon voice recognition software and may include unintentional dictation  errors.

## 2023-04-06 ENCOUNTER — Telehealth: Payer: Self-pay

## 2023-04-06 NOTE — Telephone Encounter (Signed)
Patient informed of lab results and verbalized understanding.  Provided directions for the cefadroxil as well 1000 mg BID per Dr. Rivka Safer.  Mataeo Ingwersen T Pricilla Loveless

## 2023-04-06 NOTE — Telephone Encounter (Signed)
-----   Message from Lynn Ito sent at 04/05/2023  3:22 PM EST ----- Please let him know that his labs are stable and he has to take antibiotic for another 30 days ----- Message ----- From: Leory Plowman, Lab In [place] Sent: 04/05/2023  11:32 AM EST To: Lynn Ito, MD

## 2023-04-20 ENCOUNTER — Other Ambulatory Visit: Payer: Self-pay | Admitting: Podiatry

## 2023-04-20 DIAGNOSIS — L97522 Non-pressure chronic ulcer of other part of left foot with fat layer exposed: Secondary | ICD-10-CM

## 2023-04-20 DIAGNOSIS — M86172 Other acute osteomyelitis, left ankle and foot: Secondary | ICD-10-CM

## 2023-04-24 ENCOUNTER — Other Ambulatory Visit (INDEPENDENT_AMBULATORY_CARE_PROVIDER_SITE_OTHER): Payer: Self-pay | Admitting: Nurse Practitioner

## 2023-04-27 ENCOUNTER — Other Ambulatory Visit (INDEPENDENT_AMBULATORY_CARE_PROVIDER_SITE_OTHER): Payer: Self-pay | Admitting: Nurse Practitioner

## 2023-04-30 ENCOUNTER — Other Ambulatory Visit (INDEPENDENT_AMBULATORY_CARE_PROVIDER_SITE_OTHER): Payer: Self-pay

## 2023-04-30 MED ORDER — APIXABAN 5 MG PO TABS: 5.0000 mg | ORAL_TABLET | Freq: Two times a day (BID) | ORAL | 2 refills | Status: DC

## 2023-05-03 ENCOUNTER — Ambulatory Visit: Payer: Managed Care, Other (non HMO) | Attending: Infectious Diseases | Admitting: Infectious Diseases

## 2023-05-03 ENCOUNTER — Other Ambulatory Visit
Admission: RE | Admit: 2023-05-03 | Discharge: 2023-05-03 | Disposition: A | Discharge status: Home / Self Care | Payer: Managed Care, Other (non HMO) | Source: Ambulatory Visit | Attending: Infectious Diseases | Admitting: Infectious Diseases

## 2023-05-03 VITALS — BP 129/89 | HR 94 | Temp 97.6°F | Ht 75.0 in | Wt 223.0 lb

## 2023-05-03 DIAGNOSIS — Z89432 Acquired absence of left foot: Secondary | ICD-10-CM | POA: Diagnosis not present

## 2023-05-03 DIAGNOSIS — E1142 Type 2 diabetes mellitus with diabetic polyneuropathy: Secondary | ICD-10-CM | POA: Diagnosis not present

## 2023-05-03 DIAGNOSIS — E11621 Type 2 diabetes mellitus with foot ulcer: Secondary | ICD-10-CM | POA: Insufficient documentation

## 2023-05-03 DIAGNOSIS — Z87891 Personal history of nicotine dependence: Secondary | ICD-10-CM | POA: Diagnosis not present

## 2023-05-03 DIAGNOSIS — Z7985 Long-term (current) use of injectable non-insulin antidiabetic drugs: Secondary | ICD-10-CM | POA: Insufficient documentation

## 2023-05-03 DIAGNOSIS — E11628 Type 2 diabetes mellitus with other skin complications: Secondary | ICD-10-CM | POA: Insufficient documentation

## 2023-05-03 DIAGNOSIS — L97529 Non-pressure chronic ulcer of other part of left foot with unspecified severity: Secondary | ICD-10-CM | POA: Insufficient documentation

## 2023-05-03 DIAGNOSIS — B9561 Methicillin susceptible Staphylococcus aureus infection as the cause of diseases classified elsewhere: Secondary | ICD-10-CM | POA: Diagnosis not present

## 2023-05-03 DIAGNOSIS — E1122 Type 2 diabetes mellitus with diabetic chronic kidney disease: Secondary | ICD-10-CM | POA: Diagnosis not present

## 2023-05-03 DIAGNOSIS — Z7984 Long term (current) use of oral hypoglycemic drugs: Secondary | ICD-10-CM | POA: Insufficient documentation

## 2023-05-03 DIAGNOSIS — N189 Chronic kidney disease, unspecified: Secondary | ICD-10-CM | POA: Diagnosis not present

## 2023-05-03 DIAGNOSIS — I4891 Unspecified atrial fibrillation: Secondary | ICD-10-CM | POA: Insufficient documentation

## 2023-05-03 DIAGNOSIS — L089 Local infection of the skin and subcutaneous tissue, unspecified: Secondary | ICD-10-CM | POA: Insufficient documentation

## 2023-05-03 DIAGNOSIS — Z794 Long term (current) use of insulin: Secondary | ICD-10-CM | POA: Insufficient documentation

## 2023-05-03 DIAGNOSIS — R7881 Bacteremia: Secondary | ICD-10-CM | POA: Insufficient documentation

## 2023-05-03 DIAGNOSIS — Z951 Presence of aortocoronary bypass graft: Secondary | ICD-10-CM | POA: Diagnosis not present

## 2023-05-03 DIAGNOSIS — I2510 Atherosclerotic heart disease of native coronary artery without angina pectoris: Secondary | ICD-10-CM | POA: Insufficient documentation

## 2023-05-03 DIAGNOSIS — I129 Hypertensive chronic kidney disease with stage 1 through stage 4 chronic kidney disease, or unspecified chronic kidney disease: Secondary | ICD-10-CM | POA: Diagnosis not present

## 2023-05-03 DIAGNOSIS — M86172 Other acute osteomyelitis, left ankle and foot: Secondary | ICD-10-CM | POA: Diagnosis not present

## 2023-05-03 DIAGNOSIS — E1169 Type 2 diabetes mellitus with other specified complication: Secondary | ICD-10-CM | POA: Insufficient documentation

## 2023-05-03 DIAGNOSIS — Z7901 Long term (current) use of anticoagulants: Secondary | ICD-10-CM | POA: Insufficient documentation

## 2023-05-03 DIAGNOSIS — Z79899 Other long term (current) drug therapy: Secondary | ICD-10-CM | POA: Insufficient documentation

## 2023-05-03 NOTE — Progress Notes (Signed)
 NAME: Andrew Harding  DOB: 01-Mar-1951  MRN: [id number]  Date/Time: 05/03/2023 10:23 AM   Here for follow up  for Diabetic left foot infection  ? Andrew Harding is a 73 y.o. 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 He works at the [employer] and while at work he climbed [employer] of stairs and was very sob and was asked to seek medical help. So he came to the ED Blood culture positive for MSSAand place don IV cefazolin   and MRI showed OM of left firt metatarsal with necrotic ulcer. He underwent excision of bone with delayed primary closure on 02/28/23 by Dr.Fowler. culture was MSSA and streptococcus oralis TEE on 10/31 NO vegetation 10/31 repeat BC- NG Bone pathology acute osteomyelitis with proximal margin involvement We recommended 6 weeks of IV antibiotic but patient did not want IV at home. He was very particular about oral antibiotic He wanted to go back to work as soon as he could He received 2 does of weekly Dalbavancin on 11/5 and 03/13/23 HE was discharged on 11/4 with keflex  1 gram PO q 6 for 4weeks I saw him a month ago and he has bene on cefadroxil  1 gram BID He has a wound on the lateral margin which is getting worse He has gone back to work more than a week ago He completed kelfex on 04/02/23 HE saw Dr.Fowler- today- has been referred to wound clinic for HBO  Past Medical History:  Diagnosis Date   AKI (acute kidney injury) (HCC) 09/05/2022   Atrial fibrillation with RVR (HCC) 08/19/2022   Bladder neck obstruction    Chronic kidney disease    Coronary artery disease    a.) s/p 4v CABG in 2014   Diabetes mellitus without  complication (HCC)    Diabetic neuropathy (HCC)    Diabetic peripheral neuropathy (HCC)    Diverticulosis    Gout    Heart murmur    Hypercholesteremia    Hyperlipidemia    Hypertension    Peripheral neuropathy    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   Sepsis (HCC) 08/17/2022   Tubular adenoma    Vitamin D  deficiency     Past Surgical History:  Procedure Laterality Date   AMPUTATION Left 08/19/2022   Procedure: TRANSMETATARSAL AMPUTATION LEFT FOOT WITH IRRIGATION AND DEBRIDEMENT;  Surgeon: Andrew Harding, DPM;  Location: [facility];  Service: Podiatry;  Laterality: Left;   AMPUTATION TOE Right 06/01/2015   Procedure: AMPUTATION TOE;  Surgeon: Andrew Harding, DPM;  Location: [facility];  Service: Podiatry;  Laterality: Right;   AMPUTATION TOE Left 08/11/2022   Procedure: AMPUTATION TOE 2, 3, 4;  Surgeon: Andrew Harding, DPM;  Location: [facility];  Service: Podiatry;  Laterality: Left;   CATARACT EXTRACTION, BILATERAL     CIRCUMCISION N/A 06/12/2022   Procedure: CIRCUMCISION ADULT;  Surgeon: Penne Knee, MD;  Location: [facility];  Service: Urology;  Laterality: N/A;   COLONOSCOPY WITH PROPOFOL  N/A 02/14/2016   Procedure: COLONOSCOPY WITH PROPOFOL ;  Surgeon: Andrew RAYMOND Mariner, MD;  Location: [facility] ENDOSCOPY;  Service: Endoscopy;  Laterality: N/A;   COLONOSCOPY WITH PROPOFOL  N/A 01/07/2019  Procedure: COLONOSCOPY WITH PROPOFOL ;  Surgeon: Andrew Andrew PENNER, MD;  Location: [facility] ENDOSCOPY;  Service: Endoscopy;  Laterality: N/A;   CORONARY ARTERY BYPASS GRAFT N/A 08/2012   EXCISION PARTIAL PHALANX Right 06/01/2015   Procedure: EXCISION PARTIAL PHALANX /  BONE;  Surgeon: Andrew Harding, DPM;  Location: [facility];  Service: Podiatry;  Laterality: Right;   FLEXIBLE SIGMOIDOSCOPY N/A 05/29/2016   Procedure: FLEXIBLE SIGMOIDOSCOPY;  Surgeon: Andrew Andrew Harding Gaylyn, MD;  [facility] END[facility];  Service: Endoscopy;  Laterality: N/A;   INCISION AND DRAINAGE Left 08/23/2022   Procedure: INCISION AND DRAINAGE;  Surgeon:  Andrew Harding, DPM;  Location: [facility];  Service: Podiatry;  Laterality: Left;   INCISION AND DRAINAGE OF WOUND Left 09/15/2022   Procedure: 11044 - DEBRIDE BONE and EXCISION IF 1ST METATARSAL BONE WITH  DELAY PRIMARY CLOSURE;  Surgeon: Andrew Harding, DPM;  Location: [facility];  Service: Podiatry;  Laterality: Left;   IRRIGATION AND DEBRIDEMENT FOOT Left 02/28/2023   Procedure: IRRIGATION AND DEBRIDEMENT FOOT;  Surgeon: Andrew Harding, DPM;  Location: [facility];  Service: Orthopedics/Podiatry;  Laterality: Left;   KNEE ARTHROSCOPY Left    LOWER EXTREMITY ANGIOGRAPHY Left 08/22/2022   Procedure: Lower Extremity Angiography;  Surgeon: Andrew Cordella MATSU, MD;  Location: [facility];  Service: Cardiovascular;  Laterality: Left;   TEE WITHOUT CARDIOVERSION N/A 03/01/2023   Procedure: TRANSESOPHAGEAL ECHOCARDIOGRAM;  Surgeon: Alluri, Keller BROCKS, MD;  Location: [facility];  Service: Cardiovascular;  Laterality: N/A;   WOUND DEBRIDEMENT Left 09/15/2022   Procedure: 11043 - DEBRIDE SKIN. MUSCLE FASCIA;  Surgeon: Andrew Harding, DPM;  Location: [facility];  Service: Podiatry;  Laterality: Left;    Social History   Socioeconomic History   Marital status: Divorced    Spouse name: Andrew Harding   Number of children: Not on file   Years of education: Not on file   Highest education level: Not on file  Occupational History   Not on file  Tobacco Use   Smoking status: Former    Types: Cigars   Smokeless tobacco: Never  Vaping Use   Vaping status: Never Used  Substance and Sexual Activity   Alcohol use: No   Drug use: No   Sexual activity: Never  Other Topics Concern   Not on file  Social History Narrative   Patient is legally separated and lives at home by himself. His ex -wife is his HCPOA. Neighbors and friends are his support system. He used to work for [employer]    Social Drivers of Corporate Investment Banker Strain: Low Risk  (12/13/2022)   Received from [facility]   Overall Financial Resource Strain (CARDIA)    Difficulty of Paying Living Expenses: Not hard at all  Food Insecurity: No Food Insecurity (02/27/2023)   Hunger Vital Sign    Worried About Running Out of Food in the Last Year: Never true    Ran Out of Food in the Last Year: Never true  Transportation Needs: No Transportation Needs (02/27/2023)   PRAPARE - Administrator, Civil Service (Medical): No    Lack of Transportation (Non-Medical): No  Physical Activity: Not on file  Stress: Not on file  Social Connections: Not on file  Intimate Partner Violence: Not At Risk (02/27/2023)   Humiliation, Afraid, Rape, and Kick questionnaire    Fear of Current or Ex-Partner: No    Emotionally Abused: No    Physically Abused: No    Sexually Abused:  No    Family History  Problem Relation Age of Onset   Diabetes Mellitus II Mother    CAD Mother    No Known Allergies I? Current Outpatient Medications  Medication Sig Dispense Refill   acetaminophen  (TYLENOL ) 325 MG tablet Take 1-2 tablets (325-650 mg total) by mouth every 4 (four) hours as needed for mild pain.     apixaban  (ELIQUIS ) 5 MG TABS tablet Take 1 tablet (5 mg total) by mouth 2 (two) times daily. 60 tablet 2   Bacillus Coagulans-Inulin (ALIGN PREBIOTIC-PROBIOTIC) 5-1.25 MG-GM CHEW Chew 1 tablet by mouth as directed.     cefadroxil  (DURICEF) 500 MG capsule Take 2 capsules (1,000 mg total) by mouth 2 (two) times daily. 120 capsule 0   clopidogrel  (PLAVIX ) 75 MG tablet TAKE 1 TABLET BY MOUTH EVERY DAY WITH BREAKFAST 30 tablet 5   Continuous Glucose Sensor (FREESTYLE LIBRE 2 SENSOR) MISC      docusate sodium  (COLACE) 100 MG capsule Take 1 capsule (100 mg total) by mouth 2 (two) times daily. 100 capsule 0   gabapentin  (NEURONTIN ) 300 MG capsule Take 1 capsule (300 mg total) by mouth 3 (three) times daily. 90 capsule 0   glucose blood (ONETOUCH ULTRA) test strip Use 3 (three) times daily     insulin   glargine (LANTUS ) 100 UNIT/ML Solostar Pen Inject 36 Units into the skin at bedtime. (Patient taking differently: Inject 26 Units into the skin at bedtime.) 15 mL 11   Insulin  Pen Needle (FIFTY50 PEN NEEDLES) 32G X 4 MM MISC USE 4 TIMES DAILY     metFORMIN  (GLUCOPHAGE -XR) 500 MG 24 hr tablet Take 2,000 mg by mouth every evening. Take with dinner.     metoprolol  tartrate (LOPRESSOR ) 25 MG tablet Take 1 tablet (25 mg total) by mouth 2 (two) times daily. 60 tablet 0   ondansetron  (ZOFRAN ) 4 MG tablet Take 1 tablet (4 mg total) by mouth every 6 (six) hours as needed for nausea or vomiting. 30 tablet 2   OZEMPIC, 0.25 OR 0.5 MG/DOSE, 2 MG/3ML SOPN Inject 0.75 mLs into the skin once a week.     pravastatin  (PRAVACHOL ) 80 MG tablet TAKE ONE TABLET BY MOUTH AT NIGHT     traZODone  (DESYREL ) 50 MG tablet TAKE ONE TABLET AT BEDTIME AS NEEDED     No current facility-administered medications for this visit.     Abtx:  Anti-infectives (From admission, onward)    None       REVIEW OF SYSTEMS:  Const: negative fever, negative chills, negative weight loss Eyes: negative diplopia or visual changes, negative eye pain ENT: negative coryza, negative sore throat Resp: negative cough, hemoptysis, dyspnea Cards: negative for chest pain, palpitations, lower extremity edema GU: negative for frequency, dysuria and hematuria GI: Negative for abdominal pain, diarrhea, bleeding, constipation Skin: negative for rash and pruritus Heme: negative for easy bruising and gum/nose bleeding MS: as above Neurolo:negative for headaches, dizziness, vertigo, memory problems  Psych: negative for feelings of anxiety, depression  Endocrine:  diabetes Allergy/Immunology- negative for any medication or food allergies ? Pertinent Positives include : Objective:  VITALS:  BP 129/89   Pulse 94   Temp 97.6 F (36.4 Harding) (Temporal)   Ht 6' 3 (1.905 m)   Wt 223 lb (101.2 kg)   BMI 27.87 kg/m   PHYSICAL EXAM:  General:  Alert, cooperative, no distress, appears stated age.  Head: Normocephalic, without obvious abnormality, atraumatic. Eyes: Conjunctivae clear, anicteric sclerae. Pupils are equal ENT Nares normal. No drainage  or sinus tenderness. Lips, mucosa, and tongue normal. No Thrush Neck: Supple, symmetrical, no adenopathy, thyroid: non tender no carotid bruit and no JVD. Back: No CVA tenderness. Lungs: Clear to auscultation bilaterally. No Wheezing or Rhonchi. No rales. Heart: Regular rate and rhythm, no murmur, rub or gallop. Abdomen: Soft, non-tender,not distended. Bowel sounds normal. No masses Extremities: left foot  Old TMA     Wound covered with eschar  Skin: No rashes or lesions. Or bruising Lymph: Cervical, supraclavicular normal. Neurologic: Grossly non-focal Pertinent Labs Lab Results CBC    Component Value Date/Time   WBC 8.8 04/05/2023 1126   RBC 4.16 (L) 04/05/2023 1126   HGB 12.3 (L) 04/05/2023 1126   HCT 37.7 (L) 04/05/2023 1126   PLT 232 04/05/2023 1126   MCV 90.6 04/05/2023 1126   MCH 29.6 04/05/2023 1126   MCHC 32.6 04/05/2023 1126   RDW 14.0 04/05/2023 1126   LYMPHSABS 1.2 04/05/2023 1126   MONOABS 0.6 04/05/2023 1126   EOSABS 0.2 04/05/2023 1126   BASOSABS 0.0 04/05/2023 1126       Latest Ref Rng & Units 04/05/2023   11:26 AM 03/16/2023   10:28 AM 03/09/2023   10:06 AM  CMP  Glucose 70 - 99 mg/dL 873  795  785   BUN 8 - 23 mg/dL 20  17  13    Creatinine 0.61 - 1.24 mg/dL 8.59  8.54  8.75   Sodium 135 - 145 mmol/L 138  139  137   Potassium 3.5 - 5.1 mmol/L 4.8  4.7  4.0   Chloride 98 - 111 mmol/L 103  104  101   CO2 22 - 32 mmol/L 25  26  24    Calcium  8.9 - 10.3 mg/dL 9.0  8.8  9.0   Total Protein 6.5 - 8.1 g/dL 8.0  7.2  7.6   Total Bilirubin <1.2 mg/dL 0.8  0.4  0.3   Alkaline Phos 38 - 126 U/L 63  56  67   AST 15 - 41 U/L 21  19  32   ALT 0 - 44 U/L 16  10  14       ? Impression/Recommendation ?MSSA bacteremia secondary to left foot infection  with ulcer Acute osteomyelitis of the foot with proximal margin showing osteo 'TEE negative for any endocaritis Repeat blood culture neg Pt did not want to take IV antibiotic at home on discharge- He got a week of cefazolin  and was  switched to Po Keflex  1 gram PO Q6( for 4more weeks _) .  Also got  long acting Dalbavancin once a week for 2 weeks  He completed keflex  on 12/5 and has been on cefadroxil  sicne then- 8 weeks of antibiotic Wound culture taken today Adjust antibiotic accordingly  As he is still on his feet and does not offload the wounds are persisting  Diabetes mellitus with peripheral neuropathy and diabetic foot infection Left TMA Ulcer on the plantar surface Osteomyelitis Underwent ffirst metatarsal resection and delayed primary closure on 02/28/23 Also underwent full thickness excisional debridement left medial arch   CAD status post CABG   A-fib on Eliquis  and metoprolol   CKD     Previous infection of the left foot after thermal injury leading to TMA and Enterobacter and Enterococcus infection and he got 4 weeks of IV meropenem  followed by 2 weeks of oral antibiotic earlier this year   Discussed the management with patient  Labs will be done when he goes to his PCP next month  Will discuss  with Dr.Fowler and will then plan for a follow up with me I will let him know of culture result and antibiotic change if necessary

## 2023-05-03 NOTE — Patient Instructions (Addendum)
 Diabetic foot infection- We have taken a new culture today- will let you know of the results

## 2023-05-07 ENCOUNTER — Other Ambulatory Visit (INDEPENDENT_AMBULATORY_CARE_PROVIDER_SITE_OTHER): Payer: Self-pay | Admitting: Podiatry

## 2023-05-07 DIAGNOSIS — I739 Peripheral vascular disease, unspecified: Secondary | ICD-10-CM

## 2023-05-07 LAB — AEROBIC CULTURE W GRAM STAIN (SUPERFICIAL SPECIMEN)

## 2023-05-09 ENCOUNTER — Other Ambulatory Visit: Payer: Self-pay | Admitting: Infectious Diseases

## 2023-05-09 ENCOUNTER — Telehealth: Payer: Self-pay

## 2023-05-09 MED ORDER — CIPROFLOXACIN HCL 500 MG PO TABS: 500.0000 mg | ORAL_TABLET | Freq: Two times a day (BID) | ORAL | 1 refills | Status: DC

## 2023-05-09 NOTE — Telephone Encounter (Signed)
 Patient informed of lab results and verbalized understanding. Patient aware to stop cefadroxil and start cipro. 2 week appt scheduled

## 2023-05-09 NOTE — Progress Notes (Signed)
 Pt has enterobacter, klebsiella and MSSA in the recent culture taken from the lateral wound. Will DC cefadroxil and start cirp 500mg  Po BID for 15 days ( with 1 refill)

## 2023-05-09 NOTE — Telephone Encounter (Signed)
-----   Message from DONALD BERLIN sent at 05/09/2023 12:14 PM EST ----- Can you let him know that culture I took has 3 different bacteria including the staph aureus we are already treating- So stopping cefadroxil  and starting cipro  500mg  BID for 2 weeks with 1 refill. Can we make a follow up appt for him in 2-3 weeks? Thx ----- Message ----- From: Rebecka, Lab In [place] Sent: 05/03/2023  11:29 PM EST To: Donald Berlin, MD

## 2023-05-10 ENCOUNTER — Emergency Department: Payer: Managed Care, Other (non HMO)

## 2023-05-10 ENCOUNTER — Encounter: Payer: Managed Care, Other (non HMO) | Admitting: Physician Assistant

## 2023-05-10 ENCOUNTER — Inpatient Hospital Stay
Admission: EM | Admit: 2023-05-10 | Discharge: 2023-05-29 | DRG: 474 | Disposition: A | Discharge status: Skilled Nursing Facility | Payer: Managed Care, Other (non HMO) | Attending: Internal Medicine | Admitting: Internal Medicine

## 2023-05-10 DIAGNOSIS — E1165 Type 2 diabetes mellitus with hyperglycemia: Secondary | ICD-10-CM | POA: Diagnosis present

## 2023-05-10 DIAGNOSIS — Z7985 Long-term (current) use of injectable non-insulin antidiabetic drugs: Secondary | ICD-10-CM

## 2023-05-10 DIAGNOSIS — I2510 Atherosclerotic heart disease of native coronary artery without angina pectoris: Secondary | ICD-10-CM | POA: Insufficient documentation

## 2023-05-10 DIAGNOSIS — I480 Paroxysmal atrial fibrillation: Secondary | ICD-10-CM | POA: Diagnosis present

## 2023-05-10 DIAGNOSIS — E876 Hypokalemia: Secondary | ICD-10-CM | POA: Diagnosis present

## 2023-05-10 DIAGNOSIS — Z7984 Long term (current) use of oral hypoglycemic drugs: Secondary | ICD-10-CM

## 2023-05-10 DIAGNOSIS — T8744 Infection of amputation stump, left lower extremity: Secondary | ICD-10-CM | POA: Diagnosis present

## 2023-05-10 DIAGNOSIS — I4891 Unspecified atrial fibrillation: Secondary | ICD-10-CM | POA: Diagnosis present

## 2023-05-10 DIAGNOSIS — E11622 Type 2 diabetes mellitus with other skin ulcer: Secondary | ICD-10-CM | POA: Diagnosis present

## 2023-05-10 DIAGNOSIS — L97523 Non-pressure chronic ulcer of other part of left foot with necrosis of muscle: Secondary | ICD-10-CM | POA: Diagnosis present

## 2023-05-10 DIAGNOSIS — E1142 Type 2 diabetes mellitus with diabetic polyneuropathy: Secondary | ICD-10-CM | POA: Diagnosis present

## 2023-05-10 DIAGNOSIS — Z8249 Family history of ischemic heart disease and other diseases of the circulatory system: Secondary | ICD-10-CM

## 2023-05-10 DIAGNOSIS — N1831 Chronic kidney disease, stage 3a: Secondary | ICD-10-CM | POA: Diagnosis present

## 2023-05-10 DIAGNOSIS — I739 Peripheral vascular disease, unspecified: Secondary | ICD-10-CM | POA: Diagnosis not present

## 2023-05-10 DIAGNOSIS — I129 Hypertensive chronic kidney disease with stage 1 through stage 4 chronic kidney disease, or unspecified chronic kidney disease: Secondary | ICD-10-CM | POA: Diagnosis present

## 2023-05-10 DIAGNOSIS — R7881 Bacteremia: Secondary | ICD-10-CM

## 2023-05-10 DIAGNOSIS — Z79899 Other long term (current) drug therapy: Secondary | ICD-10-CM

## 2023-05-10 DIAGNOSIS — N183 Chronic kidney disease, stage 3 unspecified: Secondary | ICD-10-CM

## 2023-05-10 DIAGNOSIS — I70245 Atherosclerosis of native arteries of left leg with ulceration of other part of foot: Secondary | ICD-10-CM | POA: Diagnosis not present

## 2023-05-10 DIAGNOSIS — E1122 Type 2 diabetes mellitus with diabetic chronic kidney disease: Secondary | ICD-10-CM | POA: Diagnosis present

## 2023-05-10 DIAGNOSIS — I9589 Other hypotension: Secondary | ICD-10-CM | POA: Diagnosis present

## 2023-05-10 DIAGNOSIS — Y835 Amputation of limb(s) as the cause of abnormal reaction of the patient, or of later complication, without mention of misadventure at the time of the procedure: Secondary | ICD-10-CM | POA: Diagnosis present

## 2023-05-10 DIAGNOSIS — D631 Anemia in chronic kidney disease: Secondary | ICD-10-CM | POA: Diagnosis present

## 2023-05-10 DIAGNOSIS — E782 Mixed hyperlipidemia: Secondary | ICD-10-CM | POA: Diagnosis present

## 2023-05-10 DIAGNOSIS — Z635 Disruption of family by separation and divorce: Secondary | ICD-10-CM

## 2023-05-10 DIAGNOSIS — S91302A Unspecified open wound, left foot, initial encounter: Secondary | ICD-10-CM | POA: Diagnosis not present

## 2023-05-10 DIAGNOSIS — Z9889 Other specified postprocedural states: Secondary | ICD-10-CM | POA: Diagnosis not present

## 2023-05-10 DIAGNOSIS — E11628 Type 2 diabetes mellitus with other skin complications: Secondary | ICD-10-CM

## 2023-05-10 DIAGNOSIS — L03116 Cellulitis of left lower limb: Secondary | ICD-10-CM | POA: Diagnosis present

## 2023-05-10 DIAGNOSIS — A419 Sepsis, unspecified organism: Principal | ICD-10-CM

## 2023-05-10 DIAGNOSIS — E1169 Type 2 diabetes mellitus with other specified complication: Secondary | ICD-10-CM | POA: Insufficient documentation

## 2023-05-10 DIAGNOSIS — M86172 Other acute osteomyelitis, left ankle and foot: Secondary | ICD-10-CM | POA: Diagnosis present

## 2023-05-10 DIAGNOSIS — Z87891 Personal history of nicotine dependence: Secondary | ICD-10-CM

## 2023-05-10 DIAGNOSIS — Z1152 Encounter for screening for COVID-19: Secondary | ICD-10-CM

## 2023-05-10 DIAGNOSIS — E11621 Type 2 diabetes mellitus with foot ulcer: Secondary | ICD-10-CM | POA: Insufficient documentation

## 2023-05-10 DIAGNOSIS — B9689 Other specified bacterial agents as the cause of diseases classified elsewhere: Secondary | ICD-10-CM | POA: Diagnosis not present

## 2023-05-10 DIAGNOSIS — L089 Local infection of the skin and subcutaneous tissue, unspecified: Secondary | ICD-10-CM | POA: Diagnosis not present

## 2023-05-10 DIAGNOSIS — Z7902 Long term (current) use of antithrombotics/antiplatelets: Secondary | ICD-10-CM

## 2023-05-10 DIAGNOSIS — E1152 Type 2 diabetes mellitus with diabetic peripheral angiopathy with gangrene: Secondary | ICD-10-CM | POA: Diagnosis present

## 2023-05-10 DIAGNOSIS — Z7901 Long term (current) use of anticoagulants: Secondary | ICD-10-CM

## 2023-05-10 DIAGNOSIS — N1832 Chronic kidney disease, stage 3b: Secondary | ICD-10-CM | POA: Diagnosis present

## 2023-05-10 DIAGNOSIS — Z794 Long term (current) use of insulin: Secondary | ICD-10-CM

## 2023-05-10 DIAGNOSIS — M869 Osteomyelitis, unspecified: Secondary | ICD-10-CM | POA: Diagnosis not present

## 2023-05-10 DIAGNOSIS — I10 Essential (primary) hypertension: Secondary | ICD-10-CM | POA: Insufficient documentation

## 2023-05-10 DIAGNOSIS — Z89421 Acquired absence of other right toe(s): Secondary | ICD-10-CM

## 2023-05-10 DIAGNOSIS — Z89432 Acquired absence of left foot: Secondary | ICD-10-CM | POA: Diagnosis not present

## 2023-05-10 DIAGNOSIS — Z833 Family history of diabetes mellitus: Secondary | ICD-10-CM

## 2023-05-10 DIAGNOSIS — L97529 Non-pressure chronic ulcer of other part of left foot with unspecified severity: Secondary | ICD-10-CM | POA: Diagnosis present

## 2023-05-10 DIAGNOSIS — H9190 Unspecified hearing loss, unspecified ear: Secondary | ICD-10-CM | POA: Diagnosis present

## 2023-05-10 DIAGNOSIS — M86372 Chronic multifocal osteomyelitis, left ankle and foot: Secondary | ICD-10-CM | POA: Insufficient documentation

## 2023-05-10 DIAGNOSIS — I4811 Longstanding persistent atrial fibrillation: Secondary | ICD-10-CM | POA: Diagnosis not present

## 2023-05-10 DIAGNOSIS — Z951 Presence of aortocoronary bypass graft: Secondary | ICD-10-CM

## 2023-05-10 HISTORY — DX: Unspecified open wound, left foot, initial encounter: S91.302A

## 2023-05-10 LAB — URINALYSIS, W/ REFLEX TO CULTURE (INFECTION SUSPECTED)
Bacteria, UA: NONE SEEN
Bilirubin Urine: NEGATIVE
Glucose, UA: 50 mg/dL — AB
Ketones, ur: NEGATIVE mg/dL
Leukocytes,Ua: NEGATIVE
Nitrite: NEGATIVE
Protein, ur: 100 mg/dL — AB
Specific Gravity, Urine: 1.017 (ref 1.005–1.030)
pH: 5 (ref 5.0–8.0)

## 2023-05-10 LAB — CBC WITH DIFFERENTIAL/PLATELET
Abs Immature Granulocytes: 0.06 10*3/uL (ref 0.00–0.07)
Basophils Absolute: 0 10*3/uL (ref 0.0–0.1)
Basophils Relative: 0 %
Eosinophils Absolute: 0 10*3/uL (ref 0.0–0.5)
Eosinophils Relative: 0 %
HCT: 34.6 % — ABNORMAL LOW (ref 39.0–52.0)
Hemoglobin: 11.4 g/dL — ABNORMAL LOW (ref 13.0–17.0)
Immature Granulocytes: 1 %
Lymphocytes Relative: 3 %
Lymphs Abs: 0.4 10*3/uL — ABNORMAL LOW (ref 0.7–4.0)
MCH: 28.4 pg (ref 26.0–34.0)
MCHC: 32.9 g/dL (ref 30.0–36.0)
MCV: 86.1 fL (ref 80.0–100.0)
Monocytes Absolute: 0.4 10*3/uL (ref 0.1–1.0)
Monocytes Relative: 3 %
Neutro Abs: 10 10*3/uL — ABNORMAL HIGH (ref 1.7–7.7)
Neutrophils Relative %: 93 %
Platelets: 293 10*3/uL (ref 150–400)
RBC: 4.02 MIL/uL — ABNORMAL LOW (ref 4.22–5.81)
RDW: 13.5 % (ref 11.5–15.5)
WBC: 10.8 10*3/uL — ABNORMAL HIGH (ref 4.0–10.5)
nRBC: 0 % (ref 0.0–0.2)

## 2023-05-10 LAB — PROTIME-INR
INR: 1.3 — ABNORMAL HIGH (ref 0.8–1.2)
Prothrombin Time: 16.8 s — ABNORMAL HIGH (ref 11.4–15.2)

## 2023-05-10 LAB — COMPREHENSIVE METABOLIC PANEL
ALT: 20 U/L (ref 0–44)
AST: 28 U/L (ref 15–41)
Albumin: 3.6 g/dL (ref 3.5–5.0)
Alkaline Phosphatase: 58 U/L (ref 38–126)
Anion gap: 13 (ref 5–15)
BUN: 15 mg/dL (ref 8–23)
CO2: 18 mmol/L — ABNORMAL LOW (ref 22–32)
Calcium: 8.6 mg/dL — ABNORMAL LOW (ref 8.9–10.3)
Chloride: 96 mmol/L — ABNORMAL LOW (ref 98–111)
Creatinine, Ser: 1.42 mg/dL — ABNORMAL HIGH (ref 0.61–1.24)
GFR, Estimated: 52 mL/min — ABNORMAL LOW (ref >60)
Glucose, Bld: 242 mg/dL — ABNORMAL HIGH (ref 70–99)
Potassium: 4 mmol/L (ref 3.5–5.1)
Sodium: 127 mmol/L — ABNORMAL LOW (ref 135–145)
Total Bilirubin: 0.6 mg/dL (ref 0.0–1.2)
Total Protein: 7.6 g/dL (ref 6.5–8.1)

## 2023-05-10 LAB — RESP PANEL BY RT-PCR (RSV, FLU A&B, COVID)  RVPGX2
Influenza A by PCR: NEGATIVE
Influenza B by PCR: NEGATIVE
Resp Syncytial Virus by PCR: NEGATIVE
SARS Coronavirus 2 by RT PCR: NEGATIVE

## 2023-05-10 LAB — LACTIC ACID, PLASMA
Lactic Acid, Venous: 2.3 mmol/L (ref 0.5–1.9)
Lactic Acid, Venous: 1.1 mmol/L (ref 0.5–1.9)

## 2023-05-10 LAB — CBG MONITORING, ED
Glucose-Capillary: 200 mg/dL — ABNORMAL HIGH (ref 70–99)
Glucose-Capillary: 147 mg/dL — ABNORMAL HIGH (ref 70–99)
Glucose-Capillary: 184 mg/dL — ABNORMAL HIGH (ref 70–99)

## 2023-05-10 LAB — APTT: aPTT: 39 s — ABNORMAL HIGH (ref 24–36)

## 2023-05-10 LAB — GLUCOSE, CAPILLARY: Glucose-Capillary: 199 mg/dL — ABNORMAL HIGH (ref 70–99)

## 2023-05-10 LAB — PROCALCITONIN: Procalcitonin: 0.43 ng/mL

## 2023-05-10 MED ORDER — METOPROLOL TARTRATE 25 MG PO TABS
25.0000 mg | ORAL_TABLET | Freq: Once | ORAL | Status: AC
  Administered 2023-05-10: 25 mg via ORAL
  Filled 2023-05-10: qty 1

## 2023-05-10 MED ORDER — ONDANSETRON HCL 4 MG PO TABS: 4.0000 mg | ORAL_TABLET | Freq: Four times a day (QID) | ORAL | Status: DC | PRN

## 2023-05-10 MED ORDER — APIXABAN 5 MG PO TABS
5.0000 mg | ORAL_TABLET | Freq: Once | ORAL | Status: AC
  Administered 2023-05-10: 5 mg via ORAL
  Filled 2023-05-10: qty 1

## 2023-05-10 MED ORDER — METRONIDAZOLE 500 MG/100ML IV SOLN
500.0000 mg | Freq: Two times a day (BID) | INTRAVENOUS | Status: AC
  Administered 2023-05-10 – 2023-05-17 (×14): 500 mg via INTRAVENOUS
  Filled 2023-05-10 (×14): qty 100

## 2023-05-10 MED ORDER — SODIUM CHLORIDE 0.9 % IV BOLUS
1000.0000 mL | Freq: Once | INTRAVENOUS | Status: AC
  Administered 2023-05-10: 1000 mL via INTRAVENOUS

## 2023-05-10 MED ORDER — SODIUM CHLORIDE 0.9 % IV SOLN
2.0000 g | Freq: Once | INTRAVENOUS | Status: AC
  Administered 2023-05-10: 2 g via INTRAVENOUS
  Filled 2023-05-10: qty 12.5

## 2023-05-10 MED ORDER — VANCOMYCIN HCL 1750 MG/350ML IV SOLN
1750.0000 mg | INTRAVENOUS | Status: DC
  Administered 2023-05-11: 1750 mg via INTRAVENOUS
  Filled 2023-05-10 (×2): qty 350

## 2023-05-10 MED ORDER — ONDANSETRON HCL 4 MG/2ML IJ SOLN: 4.0000 mg | Freq: Four times a day (QID) | INTRAMUSCULAR | Status: DC | PRN

## 2023-05-10 MED ORDER — VANCOMYCIN HCL 2000 MG/400ML IV SOLN
2000.0000 mg | Freq: Once | INTRAVENOUS | Status: AC
  Administered 2023-05-10: 2000 mg via INTRAVENOUS
  Filled 2023-05-10: qty 400

## 2023-05-10 MED ORDER — ACETAMINOPHEN 500 MG PO TABS
1000.0000 mg | ORAL_TABLET | Freq: Once | ORAL | Status: AC
  Administered 2023-05-10: 1000 mg via ORAL
  Filled 2023-05-10: qty 2

## 2023-05-10 MED ORDER — INSULIN GLARGINE-YFGN 100 UNIT/ML ~~LOC~~ SOLN
20.0000 [IU] | Freq: Every day | SUBCUTANEOUS | Status: DC
  Administered 2023-05-10 – 2023-05-14 (×5): 20 [IU] via SUBCUTANEOUS
  Filled 2023-05-10 (×6): qty 0.2

## 2023-05-10 MED ORDER — METOPROLOL TARTRATE 5 MG/5ML IV SOLN: 5.0000 mg | Freq: Once | INTRAVENOUS | Status: DC

## 2023-05-10 MED ORDER — INSULIN ASPART 100 UNIT/ML IJ SOLN
0.0000 [IU] | Freq: Three times a day (TID) | INTRAMUSCULAR | Status: DC
  Administered 2023-05-10: 2 [IU] via SUBCUTANEOUS
  Administered 2023-05-11 – 2023-05-12 (×4): 3 [IU] via SUBCUTANEOUS
  Administered 2023-05-12: 5 [IU] via SUBCUTANEOUS
  Administered 2023-05-13: 3 [IU] via SUBCUTANEOUS
  Administered 2023-05-13 (×2): 2 [IU] via SUBCUTANEOUS
  Administered 2023-05-14: 3 [IU] via SUBCUTANEOUS
  Administered 2023-05-14: 5 [IU] via SUBCUTANEOUS
  Administered 2023-05-14: 2 [IU] via SUBCUTANEOUS
  Administered 2023-05-15: 3 [IU] via SUBCUTANEOUS
  Administered 2023-05-15: 2 [IU] via SUBCUTANEOUS
  Administered 2023-05-15 – 2023-05-16 (×2): 3 [IU] via SUBCUTANEOUS
  Administered 2023-05-17: 5 [IU] via SUBCUTANEOUS
  Administered 2023-05-17: 3 [IU] via SUBCUTANEOUS
  Administered 2023-05-17: 8 [IU] via SUBCUTANEOUS
  Administered 2023-05-18 (×2): 5 [IU] via SUBCUTANEOUS
  Administered 2023-05-19: 3 [IU] via SUBCUTANEOUS
  Administered 2023-05-19: 2 [IU] via SUBCUTANEOUS
  Administered 2023-05-19 – 2023-05-20 (×2): 3 [IU] via SUBCUTANEOUS
  Administered 2023-05-20: 2 [IU] via SUBCUTANEOUS
  Administered 2023-05-20: 3 [IU] via SUBCUTANEOUS
  Administered 2023-05-21: 5 [IU] via SUBCUTANEOUS
  Administered 2023-05-21: 3 [IU] via SUBCUTANEOUS
  Administered 2023-05-21 – 2023-05-22 (×2): 2 [IU] via SUBCUTANEOUS
  Administered 2023-05-22 – 2023-05-23 (×3): 3 [IU] via SUBCUTANEOUS
  Administered 2023-05-23: 5 [IU] via SUBCUTANEOUS
  Administered 2023-05-23: 3 [IU] via SUBCUTANEOUS
  Administered 2023-05-24: 5 [IU] via SUBCUTANEOUS
  Administered 2023-05-24 – 2023-05-25 (×3): 3 [IU] via SUBCUTANEOUS
  Administered 2023-05-26: 2 [IU] via SUBCUTANEOUS
  Administered 2023-05-26 – 2023-05-27 (×2): 3 [IU] via SUBCUTANEOUS
  Administered 2023-05-27: 8 [IU] via SUBCUTANEOUS
  Administered 2023-05-28: 2 [IU] via SUBCUTANEOUS
  Administered 2023-05-28: 5 [IU] via SUBCUTANEOUS
  Administered 2023-05-28 – 2023-05-29 (×2): 3 [IU] via SUBCUTANEOUS
  Administered 2023-05-29: 2 [IU] via SUBCUTANEOUS
  Filled 2023-05-10 (×48): qty 1

## 2023-05-10 MED ORDER — SODIUM CHLORIDE 0.9 % IV SOLN
2.0000 g | Freq: Two times a day (BID) | INTRAVENOUS | Status: DC
  Administered 2023-05-10 – 2023-05-11 (×3): 2 g via INTRAVENOUS
  Filled 2023-05-10 (×4): qty 12.5

## 2023-05-10 MED ORDER — INSULIN ASPART 100 UNIT/ML IJ SOLN
0.0000 [IU] | Freq: Every day | INTRAMUSCULAR | Status: DC
Start: 2023-05-10 — End: 2023-05-29
  Administered 2023-05-14 – 2023-05-27 (×4): 2 [IU] via SUBCUTANEOUS
  Filled 2023-05-10 (×3): qty 1

## 2023-05-10 MED ORDER — HYDROXYZINE HCL 25 MG PO TABS
25.0000 mg | ORAL_TABLET | Freq: Three times a day (TID) | ORAL | Status: DC | PRN
  Administered 2023-05-10 – 2023-05-27 (×11): 25 mg via ORAL
  Filled 2023-05-10 (×11): qty 1

## 2023-05-10 MED ORDER — LACTATED RINGERS IV SOLN
150.0000 mL/h | INTRAVENOUS | Status: AC
  Administered 2023-05-10: 150 mL/h via INTRAVENOUS

## 2023-05-10 NOTE — Consult Note (Signed)
 Hospital Consult    Reason for Consult:  Left foot Osteomyelitis with Cellulitis Requesting Physician:  Dr Elspeth Masters MD MRN #:  [id number]  History of Present Illness: This is a 73 y.o. male  with medical history significant of insulin -dependent DM 2, A-fib on Eliquis , HTN, CAD status post CABG, CKD 3B, PAD s/p transmetatarsal amputation left foot presenting w/ sepsis, suspected LLE cellulitis vs. Osteomyelitis. Pt reports worsening pain over L foot for multiple weeks.  Noted to have been admitted 03/2023 for issues including MSSA bacteremia, diabetic foot infection.Has had outpt follow up w/ ID and podiatry since discharge. Left foot exray today shows no evidence of acute osteomyelitis. It does show soft tissue thickening.   On exam today patient is resting comfortably on a stretcher in the emergency room.  Patient denies any chest pain or shortness of breath at this time.  Patient does endorse that his left lower extremity has worsened over the past 3 to 4 weeks.  Patient endorses he has had an angiogram of his lower extremities in the past so he understands the procedure going forward.  No other complaint today and vitals all remained stable.  Vascular surgery consulted to evaluate left lower extremity.  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   Sepsis (HCC) 08/17/2022   Tubular adenoma    Vitamin D  deficiency     Past Surgical History:  Procedure Laterality Date   AMPUTATION Left 08/19/2022   Procedure:  TRANSMETATARSAL AMPUTATION LEFT FOOT WITH IRRIGATION AND DEBRIDEMENT;  Surgeon: Lennie Barter, DPM;  Location: [facility];  Service: Podiatry;  Laterality: Left;   AMPUTATION TOE Right 06/01/2015   Procedure: AMPUTATION TOE;  Surgeon: Donnice Cory, DPM;  Location: [facility];  Service: Podiatry;  Laterality: Right;   AMPUTATION TOE Left 08/11/2022   Procedure: AMPUTATION TOE 2, 3, 4;  Surgeon: [name], DPM;  Location: [facility];  Service: Podiatry;  Laterality: Left;   CATARACT EXTRACTION, BILATERAL     CIRCUMCISION N/A 06/12/2022   Procedure: CIRCUMCISION ADULT;  Surgeon: Penne Knee, MD;  Location: [facility];  Service: Urology;  Laterality: N/A;   COLONOSCOPY WITH PROPOFOL  N/A 02/14/2016   Procedure: COLONOSCOPY WITH PROPOFOL ;  Surgeon: Gladis RAYMOND Mariner, MD;  Location: [facility] ENDOSCOPY;  Service: Endoscopy;  Laterality: N/A;   COLONOSCOPY WITH PROPOFOL  N/A 01/07/2019   Procedure: COLONOSCOPY WITH PROPOFOL ;  Surgeon: Mariner Gladis RAYMOND, MD;  Location: [facility] ENDOSCOPY;  Service: Endoscopy;  Laterality: N/A;   CORONARY ARTERY BYPASS GRAFT N/A 08/2012   EXCISION PARTIAL PHALANX Right 06/01/2015   Procedure: EXCISION PARTIAL PHALANX /  BONE;  Surgeon: Donnice Cory, DPM;  Location: [facility];  Service: Podiatry;  Laterality: Right;   FLEXIBLE SIGMOIDOSCOPY N/A 05/29/2016   Procedure: FLEXIBLE SIGMOIDOSCOPY;  Surgeon: Gladis RAYMOND Mariner, MD;  Location: [facility] ENDOSCOPY;  Service: Endoscopy;  Laterality: N/A;   INCISION AND DRAINAGE Left 08/23/2022   Procedure: INCISION AND DRAINAGE;  Surgeon: Ashley,  Eva, DPM;  Location: [facility];  Service: Podiatry;  Laterality: Left;   INCISION AND DRAINAGE OF WOUND Left 09/15/2022   Procedure: 11044 - DEBRIDE BONE and EXCISION IF 1ST METATARSAL BONE WITH  DELAY PRIMARY CLOSURE;  Surgeon: Ashley Eva, DPM;  Location: [facility];  Service: Podiatry;  Laterality: Left;   IRRIGATION AND DEBRIDEMENT FOOT Left 02/28/2023   Procedure: IRRIGATION AND DEBRIDEMENT  FOOT;  Surgeon: Ashley Eva, DPM;  Location: [facility];  Service: Orthopedics/Podiatry;  Laterality: Left;   KNEE ARTHROSCOPY Left    LOWER EXTREMITY ANGIOGRAPHY Left 08/22/2022   Procedure: Lower Extremity Angiography;  Surgeon: Jama Cordella MATSU, MD;  Location: [facility]IVE [facility];  Service: Cardiovascular;  Laterality: Left;   TEE WITHOUT CARDIOVERSION N/A 03/01/2023   Procedure: TRANSESOPHAGEAL ECHOCARDIOGRAM;  Surgeon: Alluri, Keller BROCKS, MD;  Location: [facility];  Service: Cardiovascular;  Laterality: N/A;   WOUND DEBRIDEMENT Left 09/15/2022   Procedure: 11043 - DEBRIDE SKIN. MUSCLE FASCIA;  Surgeon: Ashley Eva, DPM;  Location: [facility];  Service: Podiatry;  Laterality: Left;    No Known Allergies  Prior to Admission medications   Medication Sig Start Date End Date Taking? Authorizing Provider  acetaminophen  (TYLENOL ) 325 MG tablet Take 1-2 tablets (325-650 mg total) by mouth every 4 (four) hours as needed for mild pain. 08/30/22   Love, Sharlet RAMAN, PA-C  apixaban  (ELIQUIS ) 5 MG TABS tablet Take 1 tablet (5 mg total) by mouth 2 (two) times daily. 04/30/23   Brown, Fallon E, NP  Bacillus Coagulans-Inulin (ALIGN PREBIOTIC-PROBIOTIC) 5-1.25 MG-GM CHEW Chew 1 tablet by mouth as directed. 03/05/23   Fausto Burnard LABOR, DO  ciprofloxacin  (CIPRO ) 500 MG tablet Take 1 tablet (500 mg total) by mouth 2 (two) times daily. 05/09/23   Fayette Bodily, MD  clopidogrel  (PLAVIX ) 75 MG tablet TAKE 1 TABLET BY MOUTH EVERY DAY WITH BREAKFAST 04/24/23   Brown, Fallon E, NP  Continuous Glucose Sensor (FREESTYLE LIBRE 2 SENSOR) MISC  09/05/22   [provider]  docusate sodium  (COLACE) 100 MG capsule Take 1 capsule (100 mg total) by mouth 2 (two) times daily. 09/05/22   Love, Sharlet RAMAN, PA-C  gabapentin  (NEURONTIN ) 300 MG capsule Take 1 capsule (300 mg total) by mouth 3 (three) times daily. 09/05/22   Love, Sharlet RAMAN, PA-C  glucose blood (ONETOUCH ULTRA) test strip Use 3 (three) times daily 09/09/18    [provider]  insulin  glargine (LANTUS ) 100 UNIT/ML Solostar Pen Inject 36 Units into the skin at bedtime. Patient taking differently: Inject 26 Units into the skin at bedtime. 09/05/22   Love, Sharlet RAMAN, PA-C  Insulin  Pen Needle (FIFTY50 PEN NEEDLES) 32G X 4 MM MISC USE 4 TIMES DAILY 07/29/18   [provider]  metFORMIN  (GLUCOPHAGE -XR) 500 MG 24 hr tablet Take 2,000 mg by mouth every evening. Take with dinner.    [provider]  metoprolol  tartrate (LOPRESSOR ) 25 MG tablet Take 1 tablet (25 mg total) by mouth 2 (two) times daily. 09/05/22   Love, Sharlet RAMAN, PA-C  ondansetron  (ZOFRAN ) 4 MG tablet Take 1 tablet (4 mg total) by mouth every 6 (six) hours as needed for nausea or vomiting. 03/05/23   Fausto Burnard A, DO  OZEMPIC, 0.25 OR 0.5 MG/DOSE, 2 MG/3ML SOPN Inject 0.75 mLs into the skin once a week.    [provider]  pravastatin  (PRAVACHOL ) 80 MG tablet TAKE ONE TABLET BY MOUTH AT NIGHT 07/08/18   [provider]  traZODone  (DESYREL ) 50 MG tablet TAKE ONE  TABLET AT BEDTIME AS NEEDED 10/21/18   [provider]    Social History   Socioeconomic History   Marital status: Divorced    Spouse name: Ashley,Angela C   Number of children: Not on file   Years of education: Not on file   Highest education level: Not on file  Occupational History   Not on file  Tobacco Use   Smoking status: Former    Types: Cigars   Smokeless tobacco: Never  Vaping Use   Vaping status: Never Used  Substance and Sexual Activity   Alcohol use: No   Drug use: No   Sexual activity: Never  Other Topics Concern   Not on file  Social History Narrative   Patient is legally separated and lives at home by himself. His ex -wife is his HCPOA. Neighbors and friends are his support system. He used to work for [employer]    Social Drivers of Corporate Investment Banker Strain: Low Risk  (12/13/2022)   Received from [facility]    Overall Financial Resource Strain (CARDIA)    Difficulty of Paying Living Expenses: Not hard at all  Food Insecurity: No Food Insecurity (02/27/2023)   Hunger Vital Sign    Worried About Running Out of Food in the Last Year: Never true    Ran Out of Food in the Last Year: Never true  Transportation Needs: No Transportation Needs (02/27/2023)   PRAPARE - Administrator, Civil Service (Medical): No    Lack of Transportation (Non-Medical): No  Physical Activity: Not on file  Stress: Not on file  Social Connections: Not on file  Intimate Partner Violence: Not At Risk (02/27/2023)   Humiliation, Afraid, Rape, and Kick questionnaire    Fear of Current or Ex-Partner: No    Emotionally Abused: No    Physically Abused: No    Sexually Abused: No     Family History  Problem Relation Age of Onset   Diabetes Mellitus II Mother    CAD Mother     ROS: Otherwise negative unless mentioned in HPI  Physical Examination  Vitals:   05/10/23 1130 05/10/23 1211  BP: 116/67   Pulse: 82   Resp: (!) 22   Temp:  99.1 F (37.3 C)  SpO2: 99%    Body mass index is 27.83 kg/m.  General:  WDWN in NAD Gait: Not observed HENT: WNL, normocephalic Pulmonary: normal non-labored breathing, without Rales, rhonchi,  wheezing Cardiac: regular, without  Murmurs, rubs or gallops; without carotid bruits Abdomen: Positive bowel sounds throughout, soft, NT/ND, no masses Skin: without rashes Vascular Exam/Pulses: Right lower extremity warm to touch with palpable pulses.  Left lower extremity with recent TMA and gangrenous sores to the lateral aspect of the foot.  Doppler pulses only. Extremities: with ischemic changes, with Gangrene , with cellulitis; with open wounds;  Musculoskeletal: no muscle wasting or atrophy  Neurologic: A&O X 3;  No focal weakness or paresthesias are detected; speech is fluent/normal Psychiatric:  The pt has Normal affect. Lymph:  Unremarkable  CBC    Component  Value Date/Time   WBC 10.8 (H) 05/10/2023 0939   RBC 4.02 (L) 05/10/2023 0939   HGB 11.4 (L) 05/10/2023 0939   HCT 34.6 (L) 05/10/2023 0939   PLT 293 05/10/2023 0939   MCV 86.1 05/10/2023 0939   MCH 28.4 05/10/2023 0939   MCHC 32.9 05/10/2023 0939   RDW 13.5 05/10/2023 0939   LYMPHSABS 0.4 (L)  05/10/2023 0939   MONOABS 0.4 05/10/2023 0939   EOSABS 0.0 05/10/2023 0939   BASOSABS 0.0 05/10/2023 0939    BMET    Component Value Date/Time   NA 127 (L) 05/10/2023 0939   K 4.0 05/10/2023 0939   CL 96 (L) 05/10/2023 0939   CO2 18 (L) 05/10/2023 0939   GLUCOSE 242 (H) 05/10/2023 0939   GLUCOSE 402 (H) 09/17/2012 1417   BUN 15 05/10/2023 0939   CREATININE 1.42 (H) 05/10/2023 0939   CALCIUM  8.6 (L) 05/10/2023 0939   GFRNONAA 52 (L) 05/10/2023 0939   GFRAA 58 (L) 06/02/2015 0656    COAGS: Lab Results  Component Value Date   INR 1.3 (H) 05/10/2023   INR 1.6 (H) 02/27/2023   INR 1.6 (H) 02/26/2023     Non-Invasive Vascular Imaging:   EXAM:05/10/2023 LEFT FOOT - COMPLETE 3+ VIEW   COMPARISON:  Radiograph dated 02/26/2023.   FINDINGS: Status post transmetatarsal amputation of the foot. There is no acute fracture or dislocation. The bones are osteopenic. There is degenerative changes of the tarsometatarsal joints. No erosive changes or periosteal elevation to suggest acute osteomyelitis. Soft tissue thickening of the stump of the forefoot with irregularity of the skin over the medial stump. No soft tissue gas. Vascular calcifications.   IMPRESSION: 1. Status post transmetatarsal amputation of the foot. No radiographic evidence of acute osteomyelitis. 2. Soft tissue thickening of the stump of the forefoot with irregularity of the skin over the medial stump. No soft tissue gas.  Statin:  No. Beta Blocker:  Yes.   Aspirin :  No. ACEI:  No. ARB:  No. CCB use:  No Other antiplatelets/anticoagulants:  Yes.   Eliquis  5 mg BID    ASSESSMENT/PLAN: This is a 73 y.o. male  who presents to [facility] emergency department for worsening pain of the left foot diabetic ulcerations/wounds.  Patient being followed by podiatry antibiotics for ID culture showing Enterobacter, Klebsiella and MSSA.  Please see photos in the media file.  Vascular surgery plans on taking the patient to the vascular lab tomorrow 05/11/2023 for left lower extremity angiogram with possible intervention.  I discussed in detail with the patient the procedure, benefits, risk, complications.  He verbalizes understanding and wishes to proceed to soon as possible.  I answered all the patient's questions this afternoon.  Patient will be made n.p.o. after midnight for procedure tomorrow.   -I discussed the plan in detail with Dr. Selinda Gu MD and he agrees with plan.   [name] [facility] 05/10/2023 1:33 PM

## 2023-05-10 NOTE — Assessment & Plan Note (Signed)
 Baseline CAD s/p CABG  No active CP  Cont home regimen

## 2023-05-10 NOTE — ED Notes (Signed)
 Rewrapped pt left foot with ABD and Kerlix per Provider verbal order.

## 2023-05-10 NOTE — Assessment & Plan Note (Addendum)
 Currently rate controlled Cont eliquis and metoprolol  Monitor

## 2023-05-10 NOTE — ED Notes (Signed)
 Pt left foot wound redressed. Pt has medium sized wound to lateral side of foot, black color, odor noted, oval shape. Pt has another wound to bottom of upper foot smaller circular size. Few smaller wounds noted also.

## 2023-05-10 NOTE — Assessment & Plan Note (Addendum)
 Creatinine with some improvement to 1.3, baseline appears to be around 1.2.  Did not met criteria for AKI. -Monitor renal function -Avoid nephrotoxins

## 2023-05-10 NOTE — Assessment & Plan Note (Addendum)
 SSI  Long acting insulin  Monitor

## 2023-05-10 NOTE — ED Notes (Signed)
 Pt called out due to being freezing in bed, pt given 4 warm blankets, very shaky. Pt states he is anxious, hospitalist messaged. See orders

## 2023-05-10 NOTE — Assessment & Plan Note (Addendum)
 Also recently seen by ID via Dr. Fayette 05/08/2022 with cultures showing enterobacter, klebsiella and MSSA in the recent culture taken from the lateral wound.  Transitioned to cipo po  L foot plain films grossly stable but MRI foot concerning for acute osteomyelitis involving the left over metatarsal from TMA -Continue with broad-spectrum antibiotics -High risk for further amputation

## 2023-05-10 NOTE — Progress Notes (Signed)
 Andrew Harding ([id number]) [id number] Page 1 of 12 Visit Report for 05/10/2023 Allergy List Details Patient Name: Date of Service: Andrew Harding [facility] H. 05/10/2023 8:15 A M Medical Record Number: [id number] Patient Account Number: [id number] Date of Birth/Sex: Treating RN: [birth year] (73 y.o. Andrew Harding Primary Care Andrew Harding: Andrew Harding Other Clinician: Referring Andrew Harding: Treating Andrew Harding/Extender: Andrew Harding in Treatment: 0 Allergies Active Allergies No Known Drug Allergies Allergy Notes Electronic Signature(s) Signed: 05/10/2023 4:11:19 PM By: Vaughn Harding Entered By: Vaughn Harding on 05/10/2023 08:22:03 -------------------------------------------------------------------------------- Arrival Information Details Patient Name: Date of Service: Andrew Harding Andrew H. 05/10/2023 8:15 A M Medical Record Number: [id number] Patient Account Number: [id number] Date of Birth/Sex: Treating RN: 1951-01-25 (73 y.o. Andrew Harding Primary Care Kourtlyn Charlet: Andrew Harding Other Clinician: Referring Pj Zehner: Treating Andrew Harding: Andrew Harding in Treatment: 0 Visit Information Patient Arrived: Ambulatory Arrival Time: 08:19 Accompanied By: self Transfer Assistance: None Patient Identification Verified: Yes Secondary Verification Process Completed: Yes Notes Was completing patients appointment, patient did not look well and stated he felt like he was going to pass out, I lowered chair, got a updated set of VS and checked his FS, which was 199. PA Stone made aware, into assess patient, PT HR increased to 120's. Left lower leg warmer than right, pt currently on cipro  but states just started today. 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 Electronic Signature(s) Signed: 05/10/2023 9:01:33 AM By: Vaughn Harding Andrew Harding ([id number]) [id number] Page 2 of 12 Signed: 05/10/2023  9:01:33 AM By: Vaughn Harding Entered By: Vaughn Harding on 05/10/2023 09:01:33 -------------------------------------------------------------------------------- Clinic Level of Care Assessment Details Patient Name: Date of Service: Andrew Harding [place] H. 05/10/2023 8:15 A M Medical Record Number: [id number] Patient Account Number: [id number] Date of Birth/Sex: Treating RN: 12/07/1950 (73 y.o. Andrew Harding Primary Care Tyneka Scafidi: Andrew Harding Other Clinician: Referring Andrew Harding: Treating Andrew Harding: Andrew Harding in Treatment: 0 Clinic Level of Care Assessment Items TOOL 2 Quantity Score []  - 0 Use when only an EandM is performed on the INITIAL visit ASSESSMENTS - Nursing Assessment / Reassessment X- 1 20 General Physical Exam (combine w/ comprehensive assessment (listed just below) when performed on new pt. evals) X- 1 25 Comprehensive Assessment (HX, ROS, Risk Assessments, Wounds Hx, etc.) ASSESSMENTS - Wound and Skin A ssessment / Reassessment []  - 0 Simple Wound Assessment / Reassessment - one wound X- 3 5 Complex Wound Assessment / Reassessment - multiple wounds []  - 0 Dermatologic / Skin Assessment (not related to wound area) ASSESSMENTS - Ostomy and/or Continence Assessment and Care []  - 0 Incontinence Assessment and Management []  - 0 Ostomy Care Assessment and Management (repouching, etc.) PROCESS - Coordination of Care []  - 0 Simple Patient / Family Education for ongoing care X- 1 20 Complex (extensive) Patient / Family Education for ongoing care X- 1 10 Staff obtains Chiropractor, Records, T Results / Process Orders est []  - 0 Staff telephones HHA, Nursing Homes / Clarify orders / etc []  - 0 Routine Transfer to another Facility (non-emergent condition) []  - 0 Routine Hospital Admission (non-emergent condition) []  - 0 New Admissions / Manufacturing Engineer / Ordering NPWT Apligraf, etc. , X- 1 20 Emergency Hospital Admission (emergent  condition) []  - 0 Simple Discharge Coordination X- 1 15 Complex (extensive) Discharge Coordination PROCESS - Special Needs []  - 0 Pediatric / Minor Patient Management []  - 0 Isolation Patient Management []  -  0 Hearing / Language / Visual special needs []  - 0 Assessment of Community assistance (transportation, D/C planning, etc.) []  - 0 Additional assistance / Altered mentation []  - 0 Support Surface(s) Assessment (bed, cushion, seat, etc.) Andrew Harding ([id number]) [id number] Page 3 of 12 INTERVENTIONS - Wound Cleansing / Measurement X- 1 5 Wound Imaging (photographs - any number of wounds) []  - 0 Wound Tracing (instead of photographs) []  - 0 Simple Wound Measurement - one wound X- 3 5 Complex Wound Measurement - multiple wounds []  - 0 Simple Wound Cleansing - one wound X- 3 5 Complex Wound Cleansing - multiple wounds INTERVENTIONS - Wound Dressings X - Small Wound Dressing one or multiple wounds 2 10 X- 1 15 Medium Wound Dressing one or multiple wounds []  - 0 Large Wound Dressing one or multiple wounds []  - 0 Application of Medications - injection INTERVENTIONS - Miscellaneous []  - 0 External ear exam []  - 0 Specimen Collection (cultures, biopsies, blood, body fluids, etc.) []  - 0 Specimen(s) / Culture(s) sent or taken to Lab for analysis []  - 0 Patient Transfer (multiple staff / Nurse, Adult / Similar devices) []  - 0 Simple Staple / Suture removal (25 or less) []  - 0 Complex Staple / Suture removal (26 or more) []  - 0 Hypo / Hyperglycemic Management (close monitor of Blood Glucose) []  - 0 Ankle / Brachial Index (ABI) - do not check if billed separately Has the patient been seen at the hospital within the last three years: Yes Total Score: 195 Level Of Care: New/Established - Level 5 Electronic Signature(s) Signed: 05/10/2023 4:11:19 PM By: Vaughn Harding Entered By: Vaughn Harding on 05/10/2023  16:00:54 -------------------------------------------------------------------------------- Encounter Discharge Information Details Patient Name: Date of Service: Andrew Harding Andrew H. 05/10/2023 8:15 A M Medical Record Number: [id number] Patient Account Number: [id number] Date of Birth/Sex: Treating RN: Mar 13, 1951 (73 y.o. Andrew Harding Primary Care Delos Klich: Andrew Harding Other Clinician: Referring Lyon Dumont: Treating Brayen Bunn/Extender: Andrew Harding in Treatment: 0 Encounter Discharge Information Items Discharge Condition: Stable Ambulatory Status: Ambulatory Discharge Destination: Emergency Room Telephoned: No Orders Sent: Yes Transportation: Ambulance Accompanied By: self Schedule Follow-up Appointment: No ZYON, ROSSER ([id number]) [id number] Page 4 of 12 Clinical Summary of Care: Notes Called (11 for transport to hospital for evaluation of possible infection, pt stating chills, feeling like he was going to pass out. All needed paperwork provided to EMS and report on status prior to patient leaving. Electronic Signature(s) Signed: 05/10/2023 4:06:12 PM By: Vaughn Harding Entered By: Vaughn Harding on 05/10/2023 16:06:12 -------------------------------------------------------------------------------- Lower Extremity Assessment Details Patient Name: Date of Service: Andrew Harding Andrew H. 05/10/2023 8:15 A M Medical Record Number: [id number] Patient Account Number: [id number] Date of Birth/Sex: Treating RN: 1951/04/07 (73 y.o. Andrew Harding Primary Care Arilla Hice: Andrew Harding Other Clinician: Referring Brenyn Petrey: Treating Rana Adorno/Extender: Andrew Harding in Treatment: 0 Edema Assessment Assessed: [Left: No] [Right: No] Edema: [Left: Ye] [Right: s] Calf Left: Right: Point of Measurement: 37 cm From Medial Instep 37 cm Ankle Left: Right: Point of Measurement: 12 cm From Medial Instep 27.2 cm Vascular  Assessment Pulses: Dorsalis Pedis Doppler Audible: [Left:Yes] Posterior Tibial Doppler Audible: [Left:Yes] Extremity colors, hair growth, and conditions: Extremity Color: [Left:Normal] Hair Growth on Extremity: [Left:No] Temperature of Extremity: [Left:Warm < 3 seconds] Notes all toes amputated Electronic Signature(s) Signed: 05/10/2023 4:11:19 PM By: Vaughn Harding Entered By: Vaughn Harding on 05/10/2023 08:53:49 Andrew Harding ([id number]) [id number] Page 5 of 12 -------------------------------------------------------------------------------- Multi  Wound Chart Details Patient Name: Date of Service: Andrew Harding [facility] H. 05/10/2023 8:15 A M Medical Record Number: [id number] Patient Account Number: [id number] Date of Birth/Sex: Treating RN: 1950-12-25 (73 y.o. Andrew Harding Primary Care Danh Bayus: Andrew Harding Other Clinician: Referring Riggs Dineen: Treating Cleo Villamizar/Extender: Andrew Harding in Treatment: 0 Vital Signs Height(in): 75 Pulse(bpm): 97 Weight(lbs): 230 Blood Pressure(mmHg): 157/78 Body Mass Index(BMI): 28.7 Temperature(F): 98 Respiratory Rate(breaths/min): 18 [1:Photos:] Amputation Site - Transmetatarsal Left Metatarsal head first Left, Plantar Foot Wound Location: Surgical Injury Gradually Appeared Gradually Appeared Wounding Event: Diabetic Wound/Ulcer of the Lower Diabetic Wound/Ulcer of the Lower Diabetic Wound/Ulcer of the Lower Primary Etiology: Extremity Extremity Extremity Arrhythmia, Coronary Artery Disease, Arrhythmia, Coronary Artery Disease, Arrhythmia, Coronary Artery Disease, Comorbid History: Hypertension, Type II Diabetes, Gout, Hypertension, Type II Diabetes, Gout, Hypertension, Type II Diabetes, Gout, Neuropathy Neuropathy Neuropathy 08/19/2022 12/31/2022 12/31/2022 Date Acquired: 0 0 0 Harding of Treatment: Open Open Open Wound Status: No No No Wound Recurrence: 0.5x2x0.3 3x5x0.1 1.5x1x0.1 Measurements  L x W x D (cm) 0.785 11.781 1.178 A (cm) : rea 0.236 1.178 0.118 Volume (cm) : Grade 2 Grade 3 Grade 2 Classification: Medium Medium Medium Exudate A mount: Serosanguineous Serosanguineous Serosanguineous Exudate Type: red, brown red, brown red, brown Exudate Color: None Present (0%) Small (1-33%) Small (1-33%) Granulation A mount: N/A Red, Pink Pink Granulation Quality: Large (67-100%) Large (67-100%) Large (67-100%) Necrotic A mount: Adherent Slough Eschar, Adherent [facility] Necrotic Tissue: None None None Epithelialization: Treatment Notes Electronic Signature(s) Signed: 05/10/2023 3:28:27 PM By: Vaughn Harding Previous Signature: 05/10/2023 8:23:40 AM Version By: Andrew Andre PA-C Entered By: Vaughn Harding on 05/10/2023 15:28:27 Andrew Harding ([id number]) [id number] Page 6 of 12 -------------------------------------------------------------------------------- Multi-Disciplinary Care Plan Details Patient Name: Date of Service: Andrew Harding [facility] H. 05/10/2023 8:15 A M Medical Record Number: [id number] Patient Account Number: [id number] Date of Birth/Sex: Treating RN: 1950/08/06 (73 y.o. Andrew Harding Primary Care Taiwana Willison: Andrew Harding Other Clinician: Referring Rhoda Waldvogel: Treating Tomasz Steeves/Extender: Andrew Harding in Treatment: 0 Active Inactive Orientation to the Wound Care Program Nursing Diagnoses: Knowledge deficit related to the wound healing center program Goals: Patient/caregiver will verbalize understanding of the Wound Healing Center Program Date Initiated: 05/10/2023 Target Resolution Date: 05/17/2023 Goal Status: Active Interventions: Provide education on orientation to the wound center Notes: Wound/Skin Impairment Nursing Diagnoses: Impaired tissue integrity Knowledge deficit related to ulceration/compromised skin integrity Goals: Ulcer/skin breakdown will have a volume reduction of 30% by week  4 Date Initiated: 05/10/2023 Target Resolution Date: 06/07/2023 Goal Status: Active Ulcer/skin breakdown will have a volume reduction of 50% by week 8 Date Initiated: 05/10/2023 Target Resolution Date: 07/05/2023 Goal Status: Active Ulcer/skin breakdown will have a volume reduction of 80% by week 12 Date Initiated: 05/10/2023 Target Resolution Date: 08/02/2023 Goal Status: Active Ulcer/skin breakdown will heal within 14 Harding Date Initiated: 05/10/2023 Target Resolution Date: 08/16/2023 Goal Status: Active Interventions: Assess patient/caregiver ability to obtain necessary supplies Assess patient/caregiver ability to perform ulcer/skin care regimen upon admission and as needed Assess ulceration(s) every visit Provide education on ulcer and skin care Notes: Electronic Signature(s) Signed: 05/10/2023 4:01:55 PM By: Vaughn Harding Entered By: Vaughn Harding on 05/10/2023 16:01:55 Andrew Harding ([id number]) [id number] Page 7 of 12 -------------------------------------------------------------------------------- Pain Assessment Details Patient Name: Date of Service: Andrew Harding H. 05/10/2023 8:15 A M Medical Record Number: [id number] Patient Account Number: [id number] Date of Birth/Sex: Treating RN: 1951-04-20 (73 y.o. Andrew Harding Primary  Care Kourtlyn Charlet: Andrew Harding Other Clinician: Referring Xion Debruyne: Treating Auria Mckinlay/Extender: Andrew Harding in Treatment: 0 Active Problems Location of Pain Severity and Description of Pain Patient Has Paino No Site Locations Pain Management and Medication Current Pain Management: Electronic Signature(s) Signed: 05/10/2023 4:11:19 PM By: Vaughn Harding Entered By: Vaughn Harding on 05/10/2023 08:19:28 -------------------------------------------------------------------------------- Patient/Caregiver Education Details Patient Name: Date of Service: Andrew Harding JENNIFER VEAR. 1/9/2025andnbsp8:15 A M Medical Record  Number: [id number] Patient Account Number: [id number] Date of Birth/Gender: Treating RN: October 18, 1950 (73 y.o. Andrew Harding Primary Care Physician: Andrew Harding Other Clinician: Referring Physician: Treating Physician/Extender: Andrew Harding in Treatment: 0 Maxden, Naji Dixon H ([id number]) [id number] Page 8 of 12 Education Assessment Education Provided To: Patient Education Topics Provided Welcome T [facility]-New Patient Packet: o Handouts: The Wound Healing Pledge form, Welcome T [facility] o Methods: Explain/Verbal Responses: State content correctly Wound/Skin Impairment: Handouts: Caring for Your Ulcer Methods: Explain/Verbal Responses: State content correctly Electronic Signature(s) Signed: 05/10/2023 4:11:19 PM By: Vaughn Harding Entered By: Vaughn Harding on 05/10/2023 16:02:49 -------------------------------------------------------------------------------- Wound Assessment Details Patient Name: Date of Service: Andrew Harding Andrew H. 05/10/2023 8:15 A M Medical Record Number: [id number] Patient Account Number: [id number] Date of Birth/Sex: Treating RN: Aug 17, 1950 (73 y.o. Andrew Harding Primary Care Infinity Jeffords: Andrew Harding Other Clinician: Referring Laci Frenkel: Treating Costa Jha/Extender: Andrew Harding in Treatment: 0 Wound Status Wound Number: 1 Primary Diabetic Wound/Ulcer of the Lower Extremity Etiology: Wound Location: Amputation Site - Transmetatarsal Wound Open Wounding Event: Surgical Injury Status: Date Acquired: 08/19/2022 Comorbid Arrhythmia, Coronary Artery Disease, Hypertension, Type II Harding Of Treatment: 0 History: Diabetes, Gout, Neuropathy Clustered Wound: No Photos Wound Measurements Length: (cm) 0.5 Width: (cm) 2 Depth: (cm) 0.3 Area: (cm) 0.785 Volume: (cm) 0.236 IVO, MOGA ([id number]) Wound Description Classification: Grade 2 Exudate Amount: Medium Exudate  Type: Serosanguineous Exudate Color: red, brown Foul Odor After Cleansing: No Slough/Fibrino Yes % Reduction in Area: % Reduction in Volume: Epithelialization: None Tunneling: No Undermining: No [id number] Page 9 of 12 Wound Bed Granulation Amount: None Present (0%) Necrotic Amount: Large (67-100%) Necrotic Quality: Adherent Slough Treatment Notes Wound #1 (Amputation Site - Transmetatarsal) Cleanser Peri-Wound Care Topical Primary Dressing Secondary Dressing ABD Pad 5x9 (in/in) Discharge Instruction: Cover with ABD pad Secured With Medipore T - 24M Medipore H Soft Cloth Surgical T ape ape, 2x2 (in/yd) Kerlix Roll Sterile or Non-Sterile 6-ply 4.5x4 (yd/yd) Discharge Instruction: Apply Kerlix as directed Compression Wrap Compression Stockings Add-Ons Electronic Signature(s) Signed: 05/10/2023 4:11:19 PM By: Vaughn Harding Entered By: Vaughn Harding on 05/10/2023 08:50:00 -------------------------------------------------------------------------------- Wound Assessment Details Patient Name: Date of Service: Andrew Harding Andrew H. 05/10/2023 8:15 A M Medical Record Number: [id number] Patient Account Number: [id number] Date of Birth/Sex: Treating RN: 08-12-1950 (73 y.o. Andrew Harding Primary Care Nickol Collister: Andrew Harding Other Clinician: Referring Brinley Rosete: Treating Amiyah Shryock/Extender: Andrew Harding in Treatment: 0 Wound Status Wound Number: 2 Primary Diabetic Wound/Ulcer of the Lower Extremity Etiology: Wound Location: Left Metatarsal head first Wound Open Wounding Event: Gradually Appeared Status: Date Acquired: 12/31/2022 Comorbid Arrhythmia, Coronary Artery Disease, Hypertension, Type II Harding Of Treatment: 0 History: Diabetes, Gout, Neuropathy Clustered Wound: No Photos JUANMANUEL, MAROHL ([id number]) [id number] Page 10 of 12 Wound Measurements Length: (cm) 3 Width: (cm) 5 Depth: (cm) 0.1 Area: (cm)  11.781 Volume: (cm) 1.178 % Reduction in Area: % Reduction in Volume: Epithelialization: None Tunneling: No Undermining: No Wound Description Classification:  Grade 3 Exudate Amount: Medium Exudate Type: Serosanguineous Exudate Color: red, brown Foul Odor After Cleansing: No Slough/Fibrino Yes Wound Bed Granulation Amount: Small (1-33%) Exposed Structure Granulation Quality: Red, Pink Fat Layer (Subcutaneous Tissue) Exposed: Yes Necrotic Amount: Large (67-100%) Necrotic Quality: Eschar, Adherent Slough Treatment Notes Wound #2 (Metatarsal head first) Wound Laterality: Left Cleanser Peri-Wound Care Topical Primary Dressing Secondary Dressing ABD Pad 5x9 (in/in) Discharge Instruction: Cover with ABD pad Secured With Medipore T - 68M Medipore H Soft Cloth Surgical T ape ape, 2x2 (in/yd) Kerlix Roll Sterile or Non-Sterile 6-ply 4.5x4 (yd/yd) Discharge Instruction: Apply Kerlix as directed Compression Wrap Compression Stockings Add-Ons Electronic Signature(s) Signed: 05/10/2023 4:11:19 PM By: Vaughn Harding Entered By: Vaughn Harding on 05/10/2023 08:51:57 Andrew Harding ([id number]) [id number] Page 11 of 12 -------------------------------------------------------------------------------- Wound Assessment Details Patient Name: Date of Service: Andrew Harding [facility] H. 05/10/2023 8:15 A M Medical Record Number: [id number] Patient Account Number: [id number] Date of Birth/Sex: Treating RN: 1950/09/06 (73 y.o. Andrew Harding Primary Care Skylynne Schlechter: Andrew Harding Other Clinician: Referring Stephaun Million: Treating Ricquel Foulk/Extender: Andrew Harding in Treatment: 0 Wound Status Wound Number: 3 Primary Diabetic Wound/Ulcer of the Lower Extremity Etiology: Wound Location: Left, Plantar Foot Wound Open Wounding Event: Gradually Appeared Status: Date Acquired: 12/31/2022 Comorbid Arrhythmia, Coronary Artery Disease, Hypertension, Type II Harding Of  Treatment: 0 History: Diabetes, Gout, Neuropathy Clustered Wound: No Photos Wound Measurements Length: (cm) 1.5 Width: (cm) 1 Depth: (cm) 0.1 Area: (cm) 1.178 Volume: (cm) 0.118 % Reduction in Area: % Reduction in Volume: Epithelialization: None Tunneling: No Undermining: No Wound Description Classification: Grade 2 Exudate Amount: Medium Exudate Type: Serosanguineous Exudate Color: red, brown Foul Odor After Cleansing: No Slough/Fibrino Yes Wound Bed Granulation Amount: Small (1-33%) Granulation Quality: Pink Necrotic Amount: Large (67-100%) Necrotic Quality: Adherent Slough Treatment Notes Wound #3 (Foot) Wound Laterality: Plantar, Left Cleanser Peri-Wound Care Topical Primary Dressing Secondary Dressing ABD Pad 5x9 (in/in) Discharge Instruction: Cover with ABD pad Secured With Medipore T - 68M Medipore H Soft Cloth Surgical T ape ape, 2x2 (in/yd) Kerlix Roll Sterile or Non-Sterile 6-ply 4.5x4 (yd/yd) Discharge Instruction: Apply Kerlix as directed Compression Wrap Compression Stockings VALTON, SCHWARTZ ([id number]) [id number] Page 12 of 12 Add-Ons Electronic Signature(s) Signed: 05/10/2023 4:11:19 PM By: Vaughn Harding Entered By: Vaughn Harding on 05/10/2023 08:53:00 -------------------------------------------------------------------------------- Vitals Details Patient Name: Date of Service: Andrew Harding Andrew H. 05/10/2023 8:15 A M Medical Record Number: [id number] Patient Account Number: [id number] Date of Birth/Sex: Treating RN: 10/13/1950 (73 y.o. Andrew Harding Primary Care Miroslava Santellan: Andrew Harding Other Clinician: Referring Khai Torbert: Treating Regena Delucchi/Extender: Andrew Harding in Treatment: 0 Vital Signs Time Taken: 08:19 Temperature (F): 98 Height (in): 75 Pulse (bpm): 97 Source: Stated Respiratory Rate (breaths/min): 18 Weight (lbs): 230 Blood Pressure (mmHg): 157/78 Source: Stated Reference Range: 80 - 120  mg / dl Body Mass Index (BMI): 28.7 Electronic Signature(s) Signed: 05/10/2023 4:11:19 PM By: Vaughn Harding Entered By: Vaughn Harding on 05/10/2023 08:21:53

## 2023-05-10 NOTE — Inpatient Diabetes Management (Signed)
 Inpatient Diabetes Program Recommendations  AACE/ADA: New Consensus Statement on Inpatient Glycemic Control  Target Ranges:  Prepandial:   less than 140 mg/dL      Peak postprandial:   less than 180 mg/dL (1-2 hours)      Critically ill patients:  140 - 180 mg/dL    Latest Reference Range & Units 05/10/23 08:37 05/10/23 12:53  Glucose-Capillary 70 - 99 mg/dL 800 (H) 799 (H)    Review of Glycemic Control  Diabetes history: DM2 Outpatient Diabetes medications: Lantus  26 units at bedtime, Metformin  XR 2000 mg QAM, Ozempic 0.5 mg Qweek, Libre 2 CGM Current orders for Inpatient glycemic control: Semglee  20 units daily  Inpatient Diabetes Program Recommendations:    Insulin : Please consider ordering CBGs AC&HS and Novolog  0-15 units TID with meals and Novolog  0-5 units at bedtime.  Thanks, Earnie Gainer, RN, MSN, CDCES Diabetes Coordinator Inpatient Diabetes Program [phone] (Team Pager from 8am to 5pm)

## 2023-05-10 NOTE — Progress Notes (Addendum)
 THEO, KRUMHOLZ ([id number]) [id number] Page 1 of 9 Visit Report for 05/10/2023 Chief Complaint Document Details Patient Name: Date of Service: Andrew Harding [facility] H. 05/10/2023 8:15 A M Medical Record Number: [id number] Patient Account Number: [id number] Date of Birth/Sex: Treating RN: 07/16/1950 (73 y.o. [name] Primary Care Provider: Fernande Cargo Other Clinician: Referring Provider: Treating Provider/Extender: Bethena Andre Ashley Eva Devra in Treatment: 0 Information Obtained from: Patient Chief Complaint Left foot osteomyelitis and foot ulcer Electronic Signature(s) Signed: 05/10/2023 8:58:29 AM By: Bethena Andre PA-C Entered By: Bethena Andre on 05/10/2023 08:58:28 -------------------------------------------------------------------------------- HPI Details Patient Name: Date of Service: Andrew Harding [facility] H. 05/10/2023 8:15 A M Medical Record Number: [id number] Patient Account Number: [id number] Date of Birth/Sex: Treating RN: 1951/04/17 (73 y.o. [name] Primary Care Provider: Fernande Cargo Other Clinician: Referring Provider: Treating Provider/Extender: Bethena Andre Ashley Eva Weeks in Treatment: 0 History of Present Illness Chronic/Inactive Conditions Condition 1: 05-10-23 upon evaluation today I did not actually perform ABIs for the patient. Will do this in the future when he returns. We were actually sending him to the ER for further evaluation and treatment secondary to the clinical picture that we are seeing today. HPI Description: 05-10-23 patient presents today for initial inspection here in our clinic concerning issues that he has been having with his left foot. He had surgery has had osteomyelitis and has been on antibiotics in fact he is currently on Cipro . He has been treated over the past several months including surgery by podiatry. That is who referred him to us  today as well. With that being said this is a quick note and I did not have a  chance to extensively look through all of his records but we will update this in the future. Nonetheless when he arrived today he actually appears to be not feeling too well. He is very cold and states that he just does not feel well. He also has a heart rate which when I took this manually was around 122. He has oxygen saturation appears to be doing pretty well but at the same time I am concerned about the possibility of him being septic secondary to this foot. I discussed that with the patient today as well. I do believe that he likely needs to go get checked out he has little bit of a runny nose it is potentially possible that he could have something like COVID and this could be unrelated to his foot but with the foot situation that he has going on I do not want to presume without knowing for sure. Patient does have a history of osteomyelitis, atrial fibrillation, long-term use of anticoagulants. Electronic Signature(s) SEATON, HOFMANN ([id number]) [id number] Page 2 of 9 Signed: 05/10/2023 9:00:27 AM By: Bethena Andre PA-C Entered By: Bethena Andre on 05/10/2023 09:00:26 -------------------------------------------------------------------------------- Physical Exam Details Patient Name: Date of Service: Andrew Harding HN H. 05/10/2023 8:15 A M Medical Record Number: [id number] Patient Account Number: [id number] Date of Birth/Sex: Treating RN: 11-15-1950 (73 y.o. [name] Primary Care Provider: Fernande Cargo Other Clinician: Referring Provider: Treating Provider/Extender: Bethena Andre Ashley Eva Weeks in Treatment: 0 Constitutional sitting or standing blood pressure is within target range for patient.. heart rate irregular.. respirations regular, non-labored and within target range for patient.SABRA temperature within target range for patient.. Well-nourished and well-hydrated in no acute distress. Eyes conjunctiva clear no eyelid edema noted. pupils equal round and  reactive to light and accommodation. Ears, Nose, Mouth, and Throat  no gross abnormality of ear auricles or external auditory canals. normal hearing noted during conversation. mucus membranes moist. Respiratory normal breathing without difficulty. Cardiovascular 1+ dorsalis pedis/posterior tibialis pulses. no clubbing, cyanosis, significant edema, <3 sec cap refill. Musculoskeletal normal gait and posture. no significant deformity or arthritic changes, no loss or range of motion, no clubbing. Psychiatric this patient is able to make decisions and demonstrates good insight into disease process. Alert and Oriented x 3. pleasant and cooperative. Notes Upon inspection patient's wound bed actually showed signs of having several openings on his left foot. Again I did not extensively evaluate things here he was actually warm to touch as far as his left foot was concerned compared to the right foot. I am concerned about the possibility of him being septic based on how he is feeling and how things are doing today. The patient is in agreement with proceeding forward with going to the ER for further evaluation and treatment and I think that this is ideal at this point. The left leg is definitely around the ankle warmer than the right leg. Electronic Signature(s) Signed: 05/10/2023 9:01:12 AM By: Bethena Ferraris PA-C Entered By: Bethena Ferraris on 05/10/2023 09:01:12 -------------------------------------------------------------------------------- Physician Orders Details Patient Name: Date of Service: Andrew Harding HN H. 05/10/2023 8:15 A M Medical Record Number: [id number] Patient Account Number: [id number] ERI, PLATTEN ([id number]) [id number] Page 3 of 9 Date of Birth/Sex: Treating RN: 09-22-1950 (73 y.o. 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 Wounds covered in ABD/kerlex. Wound Treatment Wound #1 - Amputation Site - Transmetatarsal Secondary Dressing: ABD Pad 5x9 (in/in) PRN/7 Days Discharge Instructions: Cover with ABD pad Secured With: Medipore T - 19M Medipore H Soft Cloth Surgical T ape ape, 2x2 (in/yd) PRN/7 Days Secured With: [employer] or Non-Sterile 6-ply 4.5x4 (yd/yd) PRN/7 Days Discharge Instructions: Apply Kerlix as directed Wound #2 - Metatarsal head first Wound Laterality: Left Secondary Dressing: ABD Pad 5x9 (in/in) PRN/7 Days Discharge Instructions: Cover with ABD pad Secured With: Medipore T - 19M Medipore H Soft Cloth Surgical T ape ape, 2x2 (in/yd) PRN/7 Days Secured With: [employer] or Non-Sterile 6-ply 4.5x4 (yd/yd) PRN/7 Days Discharge Instructions: Apply Kerlix as directed Wound #3 - Foot Wound Laterality: Plantar, Left Secondary Dressing: ABD Pad 5x9 (in/in) PRN/7 Days Discharge Instructions: Cover with ABD pad Secured With: Medipore T - 19M Medipore H Soft Cloth Surgical T ape ape, 2x2 (in/yd) PRN/7 Days Secured With: [employer] or Non-Sterile 6-ply 4.5x4 (yd/yd) PRN/7 Days Discharge Instructions: Apply Kerlix as directed Electronic Signature(s) Signed: 05/10/2023 4:11:19 PM By: Vaughn Mora Signed: 05/11/2023 12:58:57 PM By: Bethena Ferraris PA-C Previous Signature: 05/10/2023 10:17:03 AM Version By: Vaughn Mora Previous Signature: 05/10/2023 3:53:53 PM Version By: Bethena Ferraris PA-C Entered  By: Vaughn Mora on 05/10/2023 16:00:04 -------------------------------------------------------------------------------- Problem List Details Patient Name: Date of Service: Andrew Harding HN H. 05/10/2023 8:15 A Andrew Harding ([id number]) [id number] Page 4 of 9 Medical Record Number:  [id number] Patient Account Number: [id number] Date of Birth/Sex: Treating RN: 1950/08/16 (73 y.o. [name] Primary Care Provider: Fernande Cargo Other Clinician: Referring Provider: Treating Provider/Extender: Bethena Andre Ashley Eva Devra in Treatment: 0 Active Problems ICD-10 Encounter Code Description Active Date MDM Diagnosis [phone] Chronic multifocal osteomyelitis, left ankle and foot 05/10/2023 No Yes L97.523 Non-pressure chronic ulcer of other part of left foot with necrosis of muscle 05/10/2023 No Yes I48.0 Paroxysmal atrial fibrillation 05/10/2023 No Yes Z79.01 Long term (current) use of anticoagulants 05/10/2023 No Yes Inactive Problems Resolved Problems Electronic Signature(s) Signed: 05/10/2023 8:56:45 AM By: Bethena Andre PA-C Previous Signature: 05/10/2023 8:23:35 AM Version By: Bethena Andre PA-C Entered By: Bethena Andre on 05/10/2023 08:56:45 -------------------------------------------------------------------------------- Progress Note Details Patient Name: Date of Service: Andrew Harding HN H. 05/10/2023 8:15 A M Medical Record Number: [id number] Patient Account Number: [id number] Date of Birth/Sex: Treating RN: 1951/03/05 (73 y.o. [name] Primary Care Provider: Fernande Cargo Other Clinician: Referring Provider: Treating Provider/Extender: Bethena Andre Ashley Eva Devra in Treatment: 0 Subjective Chief Complaint Information obtained from Patient Left foot osteomyelitis and foot ulcer History of Present Illness (HPI) Chronic/Inactive Condition: 05-10-23 upon evaluation today I did not actually perform ABIs for the patient. Will do this in the future when he  returns. We were actually sending him to the ER for further evaluation and treatment secondary to the clinical picture that we are seeing today. 05-10-23 patient presents today for initial inspection here in our clinic concerning issues that he has been having with his left foot. He had surgery has had osteomyelitis and has been on antibiotics in fact he is currently on Cipro . He has been treated over the past several months including surgery by podiatry. That is who referred him to us  today as well. With that being said this is a quick note and I did not have a chance to extensively look through all of his records but we will update this in the future. Nonetheless when he arrived today he actually appears to be not feeling too well. He is very cold and states that he just does not feel well. He also has a heart rate which when I took this manually was around 122. He has oxygen saturation appears to be doing pretty well but at Andrew Harding, Andrew Harding ([id number]) [id number] Page 5 of 9 the same time I am concerned about the possibility of him being septic secondary to this foot. I discussed that with the patient today as well. I do believe that he likely needs to go get checked out he has little bit of a runny nose it is potentially possible that he could have something like COVID and this could be unrelated to his foot but with the foot situation that he has going on I do not want to presume without knowing for sure. Patient does have a history of osteomyelitis, atrial fibrillation, long-term use of anticoagulants. Patient History Information obtained from Patient, Chart. Allergies No Known Drug Allergies Social History Never smoker, Alcohol Use - Never, Drug Use - No History, Caffeine Use - Never. Medical History Cardiovascular Patient has history of Arrhythmia - a fib, Coronary Artery Disease, Hypertension Endocrine Patient has history of Type II  Diabetes Musculoskeletal Patient has history of Gout Neurologic Patient has history of Neuropathy Patient is treated with Insulin . Review of Systems (ROS) Constitutional Symptoms (General Health) Denies complaints or symptoms of Fatigue, Fever, Chills, Marked Weight Change. Eyes Complains or has symptoms of Glasses / Contacts. Ear/Nose/Mouth/Throat Denies  complaints or symptoms of Difficult clearing ears, Sinusitis. Hematologic/Lymphatic Denies complaints or symptoms of Bleeding / Clotting Disorders, Human Immunodeficiency Virus. Respiratory Denies complaints or symptoms of Chronic or frequent coughs, Shortness of Breath. Gastrointestinal Denies complaints or symptoms of Frequent diarrhea, Nausea, Vomiting. Genitourinary CKD Immunological Denies complaints or symptoms of Hives, Itching. Integumentary (Skin) Complains or has symptoms of Wounds. Psychiatric Denies complaints or symptoms of Anxiety, Claustrophobia. Objective Constitutional sitting or standing blood pressure is within target range for patient.. heart rate irregular.. respirations regular, non-labored and within target range for patient.SABRA temperature within target range for patient.. Well-nourished and well-hydrated in no acute distress. Vitals Time Taken: 8:19 AM, Height: 75 in, Source: Stated, Weight: 230 lbs, Source: Stated, BMI: 28.7, Temperature: 98 F, Pulse: 97 bpm, Respiratory Rate: 18 breaths/min, Blood Pressure: 157/78 mmHg. Eyes conjunctiva clear no eyelid edema noted. pupils equal round and reactive to light and accommodation. Ears, Nose, Mouth, and Throat no gross abnormality of ear auricles or external auditory canals. normal hearing noted during conversation. mucus membranes moist. Respiratory normal breathing without difficulty. Cardiovascular 1+ dorsalis pedis/posterior tibialis pulses. no clubbing, cyanosis, significant edema, Musculoskeletal normal gait and posture. no significant deformity or  arthritic changes, no loss or range of motion, no clubbing. Psychiatric this patient is able to make decisions and demonstrates good insight into disease process. Alert and Oriented x 3. pleasant and cooperative. General Notes: Upon inspection patient's wound bed actually showed signs of having several openings on his left foot. Again I did not extensively evaluate things here he was actually warm to touch as far as his left foot was concerned compared to the right foot. I am concerned about the possibility of him being septic based on how he is feeling and how things are doing today. The patient is in agreement with proceeding forward with going to the ER for further evaluation and treatment and I think that this is ideal at this point. The left leg is definitely around the ankle warmer than the right leg. Andrew Harding, Andrew Harding ([id number]) [id number] Page 6 of 9 Integumentary (Hair, Skin) Wound #1 status is Open. Original cause of wound was Surgical Injury. The date acquired was: 08/19/2022. The wound is located on the Amputation Site - Transmetatarsal. The wound measures 0.5cm length x 2cm width x 0.3cm depth; 0.785cm^2 area and 0.236cm^3 volume. There is no tunneling or undermining noted. There is a medium amount of serosanguineous drainage noted. There is no granulation within the wound bed. There is a large (67-100%) amount of necrotic tissue within the wound bed including Adherent Slough. Wound #2 status is Open. Original cause of wound was Gradually Appeared. The date acquired was: 12/31/2022. The wound is located on the Left Metatarsal head first. The wound measures 3cm length x 5cm width x 0.1cm depth; 11.781cm^2 area and 1.178cm^3 volume. There is Fat Layer (Subcutaneous Tissue) exposed. There is no tunneling or undermining noted. There is a medium amount of serosanguineous drainage noted. There is small (1-33%) red, pink granulation within the wound bed. There is a large  (67-100%) amount of necrotic tissue within the wound bed including Eschar and Adherent Slough. Wound #3 status is Open. Original cause of wound was Gradually Appeared. The date acquired was: 12/31/2022. The wound is located on the Left,Plantar Foot. The wound measures 1.5cm length x 1cm width x 0.1cm depth; 1.178cm^2 area and 0.118cm^3 volume. There is no tunneling or undermining noted. There is a medium amount of serosanguineous drainage noted. There is small (1-33%) pink granulation within  the wound bed. There is a large (67-100%) amount of necrotic tissue within the wound bed including Adherent Slough. 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 Wounds covered in ABD/kerlex. WOUND #1: - Amputation Site - Transmetatarsal Wound Laterality: Secondary Dressing: ABD Pad 5x9 (in/in) PRN/7 Days Discharge Instructions: Cover with ABD pad Secured With: Medipore T - 58M Medipore H Soft Cloth Surgical T ape ape, 2x2 (in/yd) PRN/7 Days Secured With: [employer] or Non-Sterile 6-ply 4.5x4 (yd/yd) PRN/7 Days Discharge Instructions: Apply Kerlix as directed WOUND #2: - Metatarsal head first Wound Laterality: Left Secondary Dressing: ABD Pad 5x9 (in/in) PRN/7 Days Discharge Instructions: Cover with ABD pad Secured With: Medipore T - 58M Medipore H Soft Cloth Surgical T ape ape, 2x2 (in/yd) PRN/7 Days Secured With: [employer] or Non-Sterile 6-ply 4.5x4 (yd/yd) PRN/7 Days Discharge Instructions: Apply Kerlix as directed WOUND #3: - Foot Wound Laterality: Plantar, Left Secondary Dressing: ABD Pad 5x9 (in/in) PRN/7 Days Discharge Instructions: Cover with ABD pad Secured With: Medipore T - 58M  Medipore H Soft Cloth Surgical T ape ape, 2x2 (in/yd) PRN/7 Days Secured With: [employer] or Non-Sterile 6-ply 4.5x4 (yd/yd) PRN/7 Days Discharge Instructions: Apply Kerlix as directed 1. I do believe that the patient needs to go to the ER for further evaluation and treatment due to the fact that I am afraid that he could potentially be septic. We are going to put a dressing on to get him there. 2. It is possible that he could have something such as COVID he does have some nasal congestion he tells me he just darted feeling this poorly this morning. Nonetheless he was also a little off balance and in general with the increased heart rate as well I am a little concerned about this picture more for sepsis that I am for is something like a respiratory infection. 3. I am going to go ahead and just wrap him up to have something in place to get him to the hospital and then we will see him back for evaluation following the hospital evaluation depending on the results and findings. 4. I do believe the patient could be a candidate for hyperbaric oxygen therapy however we need to be able to dive deeper into his history and record some things as well which I will do in the future with that being said right now think we need to address the acute issue which is the potential for sepsis here. Will see him back after the evaluation at the ER. Electronic Signature(s) Signed: 05/17/2023 9:54:35 AM By: Bethena Ferraris PA-C Previous Signature: 05/10/2023 9:02:29 AM Version By: Bethena Ferraris PA-C Entered By: Bethena Ferraris on 05/17/2023 09:54:35 [place], Andrew Harding ([id number]) [id number] Page 7 of 9 -------------------------------------------------------------------------------- ROS/PFSH Details Patient Name: Date of Service: Andrew Harding [facility] H. 05/10/2023 8:15 A M Medical Record Number: [id number] Patient Account Number: [id number] Date of Birth/Sex: Treating RN: 27-Aug-1950 (73 y.o. [name] Primary Care Provider: Fernande Cargo Other Clinician: Referring Provider: Treating Provider/Extender: Bethena Ferraris Ashley Eva Devra in Treatment: 0 Information Obtained From Patient Chart Constitutional Symptoms (General Health) Complaints and Symptoms: Negative for: Fatigue; Fever; Chills; Marked Weight Change Eyes Complaints and Symptoms: Positive for: Glasses / Contacts Ear/Nose/Mouth/Throat Complaints and Symptoms: Negative  for: Difficult clearing ears; Sinusitis Hematologic/Lymphatic Complaints and Symptoms: Negative for: Bleeding / Clotting Disorders; Human Immunodeficiency Virus Respiratory Complaints and Symptoms: Negative for: Chronic or frequent coughs; Shortness of Breath Gastrointestinal Complaints and Symptoms: Negative for: Frequent diarrhea; Nausea; Vomiting Immunological Complaints and Symptoms: Negative for: Hives; Itching Integumentary (Skin) Complaints and Symptoms: Positive for: Wounds Psychiatric Complaints and Symptoms: Negative for: Anxiety; Claustrophobia Cardiovascular Medical History: Positive for: Arrhythmia - a fib; Coronary Artery Disease; Hypertension Endocrine Medical History: Positive for: Type II Diabetes Andrew Harding, Andrew Harding ([id number]) [id number] Page 8 of 9 Time with diabetes: 30 + years Treated with: Insulin  Genitourinary Complaints and Symptoms: Review of System Notes: CKD Musculoskeletal Medical History: Positive for: Gout Neurologic Medical History: Positive for: Neuropathy Oncologic Immunizations Pneumococcal Vaccine: Received Pneumococcal Vaccination: No Implantable Devices None Family and Social History Never smoker; Alcohol Use: Never; Drug Use: No History; Caffeine Use: Never Social Determinants of Health (SDOH) 1. In the past 2 months, did you or others you live with eat smaller meals or skip meals because you didn't have money for foodo : No 2. Are you homeless or worried that  you might be in the futureo : No 3. Do you have trouble paying for your utilities (gas, electricity, phone)o : No 4. Do you have trouble finding or paying for a rideo : No 5. Do you need daycare, or better daycare, for your kidso : No 6. Are you unemployed or without regular incomeo : No 7. Do you need help finding a better jobo : No 8. Do you need help getting more educationo : No 9. Are you concerned about someone in your home using drugs or alcoholo : No 10. Do you feel unsafe in your daily lifeo : No 11. Is anyone in your home threatening or abusing youo : No 12. Do you lack quality relationships that make you feel valued and supportedo : No 13. Do you need help getting cultural information in a language you understando : No 14. Do you need help getting internet accesso : No Advanced Directives and Instructions Spiritual or Cultural beliefs preclude asking about Advance Care Planning: No Advanced Directives: No Patient wants information on Advanced Directives: No Do not resuscitate: No Living Will: No Medical Power of Attorney: No Surrogate Decision Maker: No Electronic Signature(s) Signed: 05/10/2023 3:53:53 PM By: Bethena Ferraris PA-C Signed: 05/10/2023 4:11:19 PM By: Vaughn Mora Entered By: Vaughn Mora on 05/10/2023 08:24:53 Andrew Harding ([id number]) [id number] Page 9 of 9 -------------------------------------------------------------------------------- SuperBill Details Patient Name: Date of Service: Andrew Harding JENNIFER HILARIO 05/10/2023 Medical Record Number: [id number] Patient Account Number: [id number] Date of Birth/Sex: Treating RN: Sep 25, 1950 (73 y.o. [name] Primary Care Provider: Fernande Cargo Other Clinician: Referring Provider: Treating Provider/Extender: Bethena Ferraris Ashley Eva Weeks in Treatment: 0 Diagnosis Coding ICD-10 Codes Code Description [phone] Chronic multifocal osteomyelitis, left ankle and foot L97.523 Non-pressure  chronic ulcer of other part of left foot with necrosis of muscle I48.0 Paroxysmal atrial fibrillation Z79.01 Long term (current) use of anticoagulants Facility Procedures : CPT4 Code: [id number] Description: 00784 - WOUND CARE VISIT-LEV 5 EST PT Modifier: Quantity: 1 Physician Procedures : CPT4 Code Description Modifier [id number] - WC PHYS LEVEL 5 - NEW PT ICD-10 Diagnosis Description M86.372 Chronic multifocal osteomyelitis, 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

## 2023-05-10 NOTE — Consult Note (Signed)
 NAME: Andrew Harding  DOB: [birth year]  MRN: [id number]  Date/Time: 05/10/2023 7:23 PM  REQUESTING PROVIDER: Dr.Newton Subjective:  REASON FOR CONSULT: diabteic foot infection left  ? Andrew Harding is a 73 y.o. with a history of diabetes mellitus, CAD, status post CABG peripheral neuropathy, hypertension, right foot infection with amputation of the rt great toe in 2017 , left TMA secondary to Thermal injury to left foot and gangrene ,   Presents from wound clinic with worsening foot ulcer lateral margin of the left foot. He has some chills  Pt recently was in [facility] on 02/26/23 with  MSSA bacteremia and first metatarsal necrotic ulcer and osteomyelitis for which he underwent excision of bone on 02/28/23 . Culture then was MSSA.  TEE on 10/31 NO vegetation 10/31 repeat BC- NG Bone pathology acute osteomyelitis with proximal margin involvement We recommended 6 weeks of IV antibiotic but patient did not want IV at home. He was very particular about oral antibiotic He wanted to go back to work as soon as he could.  HE was discharged on 11/4 with keflex  1 gram PO q 6 for 4weeks He also received 2 does of weekly Dalbavancin on 11/5 and 03/13/23  I followed him as OP and after completing keflex  on 04/02/23  he was switched to Po cefadroxil  1 gram BID since then. 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  Temp 99.1 F (37.3 C)  Pulse Rate 94  Resp 23 !  SpO2 99 %     Latest Reference Range & Units 05/10/23  WBC 4.0 - 10.5 K/uL 10.8 (H)  Hemoglobin 13.0 - 17.0 g/dL 88.5 (L)  HCT 60.9 - 47.9 % 34.6 (L)  Platelets 150 - 400 K/uL 293   Creatinine 0.61 - 1.24 mg/dL 8.57 (H)   Blood culture sent Xray did not show any osteo Seen by podiatrist and MRI is ordered Pt is on Iv vanco, cefepime  and flagyl   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   Sepsis (HCC) 08/17/2022   Tubular adenoma    Vitamin D  deficiency     Past Surgical History:  Procedure Laterality Date   AMPUTATION Left 08/19/2022   Procedure: TRANSMETATARSAL AMPUTATION LEFT FOOT WITH IRRIGATION AND DEBRIDEMENT;  Surgeon: Lennie Barter, DPM;  Location: [facility];  Service: Podiatry;  Laterality: Left;   AMPUTATION TOE Right 06/01/2015   Procedure: AMPUTATION TOE;  Surgeon: Donnice Cory, DPM;  Location: [facility];  Service: Podiatry;  Laterality: Right;  AMPUTATION TOE Left 08/11/2022   Procedure: AMPUTATION TOE 2, 3, 4;  Surgeon: [name], DPM;  Location: [facility];  Service: Podiatry;  Laterality: Left;   CATARACT EXTRACTION, BILATERAL     CIRCUMCISION N/A 06/12/2022   Procedure: CIRCUMCISION ADULT;  Surgeon: Penne Knee, MD;  Location: [facility];  Service: Urology;  Laterality: N/A;   COLONOSCOPY WITH PROPOFOL  N/A 02/14/2016   Procedure: COLONOSCOPY WITH PROPOFOL ;  Surgeon: Gladis RAYMOND Mariner, MD;  Location: [facility] ENDOSCOPY;  Service: Endoscopy;  Laterality: N/A;   COLONOSCOPY WITH PROPOFOL  N/A 01/07/2019   Procedure: COLONOSCOPY WITH PROPOFOL ;  Surgeon: Mariner Gladis RAYMOND, MD;  Location: [facility] ENDOSCOPY;  Service: Endoscopy;  Laterality: N/A;   CORONARY ARTERY BYPASS GRAFT N/A 08/2012    EXCISION PARTIAL PHALANX Right 06/01/2015   Procedure: EXCISION PARTIAL PHALANX /  BONE;  Surgeon: Donnice Cory, DPM;  Location: [facility];  Service: Podiatry;  Laterality: Right;   FLEXIBLE SIGMOIDOSCOPY N/A 05/29/2016   Procedure: FLEXIBLE SIGMOIDOSCOPY;  Surgeon: Gladis RAYMOND Mariner, MD;  Location: [facility] ENDOSCOPY;  Service: Endoscopy;  Laterality: N/A;   INCISION AND DRAINAGE Left 08/23/2022   Procedure: INCISION AND DRAINAGE;  Surgeon: [name], DPM;  Location: [facility];  Service: Podiatry;  Laterality: Left;   INCISION AND DRAINAGE OF WOUND Left 09/15/2022   Procedure: 11044 - DEBRIDE BONE and EXCISION IF 1ST METATARSAL BONE WITH  DELAY PRIMARY CLOSURE;  Surgeon: [name], DPM;  Location: [facility];  Service: Podiatry;  Laterality: Left;   IRRIGATION AND DEBRIDEMENT FOOT Left 02/28/2023   Procedure: IRRIGATION AND DEBRIDEMENT FOOT;  Surgeon: [name], DPM;  Location: [facility];  Service: Orthopedics/Podiatry;  Laterality: Left;   KNEE ARTHROSCOPY Left    LOWER EXTREMITY ANGIOGRAPHY Left 08/22/2022   Procedure: Lower Extremity Angiography;  Surgeon: Jama Cordella MATSU, MD;  Location: [facility]AS[facility];  Service: Cardiovascular;  Laterality: Left;   TEE WITHOUT CARDIOVERSION N/A 03/01/2023   Procedure: TRANSESOPHAGEAL ECHOCARDIOGRAM;  Surgeon: Alluri, Keller BROCKS, MD;  Location: [facility];  Service: Cardiovascular;  Laterality: N/A;   WOUND DEBRIDEMENT Left 09/15/2022   Procedure: 11043 - DEBRIDE SKIN. MUSCLE FASCIA;  Surgeon: [name], DPM;  Location: [facility];  Service: Podiatry;  Laterality: Left;    Social History   Socioeconomic History   Marital status: Divorced    Spouse name: Ashley,Angela C   Number of children: Not on file   Years of education: Not on file   Highest education level: Not on file  Occupational History   Not on file  Tobacco Use   Smoking status: Former    Types: Cigars   Smokeless tobacco: Never  Vaping Use   Vaping status: Never Used   Substance and Sexual Activity   Alcohol use: No   Drug use: No   Sexual activity: Never  Other Topics Concern   Not on file  Social History Narrative   Patient is legally separated and lives at home by himself. His ex -wife is his HCPOA. Neighbors and friends are his support system. He used to work for [employer]    Social Drivers of Corporate Investment Banker Strain: Low Risk  (12/13/2022)   Received from [facility]   Overall Financial Resource Strain (CARDIA)    Difficulty of Paying Living Expenses: Not hard at all  Food Insecurity: No Food Insecurity (02/27/2023)   Hunger Vital Sign    Worried About Running Out of Food in the Last Year: Never true  Ran Out of Food in the Last Year: Never true  Transportation Needs: No Transportation Needs (02/27/2023)   PRAPARE - Administrator, Civil Service (Medical): No    Lack of Transportation (Non-Medical): No  Physical Activity: Not on file  Stress: Not on file  Social Connections: Not on file  Intimate Partner Violence: Not At Risk (02/27/2023)   Humiliation, Afraid, Rape, and Kick questionnaire    Fear of Current or Ex-Partner: No    Emotionally Abused: No    Physically Abused: No    Sexually Abused: No    Family History  Problem Relation Age of Onset   Diabetes Mellitus II Mother    CAD Mother    No Known Allergies I? Current Facility-Administered Medications  Medication Dose Route Frequency Provider Last Rate Last Admin   ceFEPIme  (MAXIPIME ) 2 g in sodium chloride  0.9 % 100 mL IVPB  2 g Intravenous Q12H Elesa Perkins, RPH       hydrOXYzine  (ATARAX ) tablet 25 mg  25 mg Oral TID PRN Newton, Steven J, MD   25 mg at 05/10/23 1702   insulin  aspart (novoLOG ) injection 0-15 Units  0-15 Units Subcutaneous TID WC Eldonna Elspeth PARAS, MD   2 Units at 05/10/23 1703   insulin  aspart (novoLOG ) injection 0-5 Units  0-5 Units Subcutaneous QHS Eldonna Elspeth PARAS, MD       insulin   glargine-yfgn (SEMGLEE ) injection 20 Units  20 Units Subcutaneous Daily Eldonna Elspeth PARAS, MD   20 Units at 05/10/23 1336   lactated ringers  infusion  150 mL/hr Intravenous Continuous Eldonna Elspeth PARAS, MD 150 mL/hr at 05/10/23 1338 150 mL/hr at 05/10/23 1338   metroNIDAZOLE  (FLAGYL ) IVPB 500 mg  500 mg Intravenous Q12H Eldonna Elspeth PARAS, MD       ondansetron  (ZOFRAN ) tablet 4 mg  4 mg Oral Q6H PRN Newton, Steven J, MD       Or   ondansetron  (ZOFRAN ) injection 4 mg  4 mg Intravenous Q6H PRN Newton, Steven J, MD       [START ON 05/11/2023] vancomycin  (VANCOREADY) IVPB 1750 mg/350 mL  1,750 mg Intravenous Q24H Elesa Perkins, [facility]       Current Outpatient Medications  Medication Sig Dispense Refill   acetaminophen  (TYLENOL ) 325 MG tablet Take 1-2 tablets (325-650 mg total) by mouth every 4 (four) hours as needed for mild pain.     apixaban  (ELIQUIS ) 5 MG TABS tablet Take 1 tablet (5 mg total) by mouth 2 (two) times daily. 60 tablet 2   ciprofloxacin  (CIPRO ) 500 MG tablet Take 1 tablet (500 mg total) by mouth 2 (two) times daily. 30 tablet 1   clopidogrel  (PLAVIX ) 75 MG tablet TAKE 1 TABLET BY MOUTH EVERY DAY WITH BREAKFAST 30 tablet 5   docusate sodium  (COLACE) 100 MG capsule Take 1 capsule (100 mg total) by mouth 2 (two) times daily. 100 capsule 0   gabapentin  (NEURONTIN ) 300 MG capsule Take 1 capsule (300 mg total) by mouth 3 (three) times daily. 90 capsule 0   insulin  glargine (LANTUS ) 100 UNIT/ML Solostar Pen Inject 36 Units into the skin at bedtime. (Patient taking differently: Inject 34 Units into the skin at bedtime.) 15 mL 11   metFORMIN  (GLUCOPHAGE -XR) 500 MG 24 hr tablet Take 2,000 mg by mouth every evening. Take with dinner.     metoprolol  tartrate (LOPRESSOR ) 25 MG tablet Take 1 tablet (25 mg total) by mouth 2 (two) times daily. 60 tablet 0   OZEMPIC, 0.25 OR 0.5 MG/DOSE,  2 MG/3ML SOPN Inject 0.75 mLs into the skin once a week.     pravastatin  (PRAVACHOL ) 80 MG tablet TAKE ONE TABLET BY  MOUTH AT NIGHT     traZODone  (DESYREL ) 50 MG tablet TAKE ONE TABLET AT BEDTIME AS NEEDED     Bacillus Coagulans-Inulin (ALIGN PREBIOTIC-PROBIOTIC) 5-1.25 MG-GM CHEW Chew 1 tablet by mouth as directed. (Patient not taking: Reported on 05/10/2023)     Continuous Glucose Sensor (FREESTYLE LIBRE 2 SENSOR) MISC      glucose blood (ONETOUCH ULTRA) test strip Use 3 (three) times daily     Insulin  Pen Needle (FIFTY50 PEN NEEDLES) 32G X 4 MM MISC USE 4 TIMES DAILY     ondansetron  (ZOFRAN ) 4 MG tablet Take 1 tablet (4 mg total) by mouth every 6 (six) hours as needed for nausea or vomiting. (Patient not taking: Reported on 05/10/2023) 30 tablet 2     Abtx:  Anti-infectives (From admission, onward)    Start     Dose/Rate Route Frequency Ordered Stop   05/11/23 1300  vancomycin  (VANCOREADY) IVPB 1750 mg/350 mL        1,750 mg 175 mL/hr over 120 Minutes Intravenous Every 24 hours 05/10/23 1208     05/10/23 2300  ceFEPIme  (MAXIPIME ) 2 g in sodium chloride  0.9 % 100 mL IVPB        2 g 200 mL/hr over 30 Minutes Intravenous Every 12 hours 05/10/23 1208     05/10/23 2000  metroNIDAZOLE  (FLAGYL ) IVPB 500 mg        500 mg 100 mL/hr over 60 Minutes Intravenous Every 12 hours 05/10/23 1145 05/17/23 1959   05/10/23 1100  ceFEPIme  (MAXIPIME ) 2 g in sodium chloride  0.9 % 100 mL IVPB        2 g 200 mL/hr over 30 Minutes Intravenous  Once 05/10/23 1050 05/10/23 1141   05/10/23 1100  vancomycin  (VANCOREADY) IVPB 2000 mg/400 mL        2,000 mg 200 mL/hr over 120 Minutes Intravenous  Once 05/10/23 1050 05/10/23 1528       REVIEW OF SYSTEMS:  Const: negative fever, has chills, negative weight loss Eyes: negative diplopia or visual changes, negative eye pain ENT: negative coryza, negative sore throat, hard of hearing Resp: negative cough, hemoptysis, dyspnea Cards: negative for chest pain, palpitations, lower extremity edema GU: negative for frequency, dysuria and hematuria GI: Negative for abdominal pain,  diarrhea, bleeding, constipation Skin: negative for rash and pruritus Heme: negative for easy bruising and gum/nose bleeding MS: negative for myalgias, arthralgias, back pain and muscle weakness Neurolo:negative for headaches, dizziness, vertigo, memory problems  Psych: negative for feelings of anxiety, depression  Endocrine:  diabetes Allergy/Immunology- negative for any medication or food allergies ? Pertinent Positives include : Objective:  VITALS:  BP (!) 154/80   Pulse 94   Temp 99.1 F (37.3 C) (Oral)   Resp (!) 23   Ht 6' 3 (1.905 m)   Wt 101 kg   SpO2 99%   BMI 27.83 kg/m   PHYSICAL EXAM:  General: Alert, cooperative, no distress, appears stated age.  Head: Normocephalic, without obvious abnormality, atraumatic. Eyes: Conjunctivae clear, anicteric sclerae. Pupils are equal ENT Nares normal. No drainage or sinus tenderness. Lips, mucosa, and tongue normal. No Thrush Neck: Supple, symmetrical, no adenopathy, thyroid: non tender no carotid bruit and no JVD. Back: No CVA tenderness. Lungs: Clear to auscultation bilaterally. No Wheezing or Rhonchi. No rales. Heart: Regular rate and rhythm, no murmur, rub or gallop. Abdomen: Soft,  non-tender,not distended. Bowel sounds normal. No masses Extremities: left foot TMA       Skin: No rashes or lesions. Or bruising Lymph: Cervical, supraclavicular normal. Neurologic: Grossly non-focal Pertinent Labs Lab Results CBC    Component Value Date/Time   WBC 10.8 (H) 05/10/2023 0939   RBC 4.02 (L) 05/10/2023 0939   HGB 11.4 (L) 05/10/2023 0939   HCT 34.6 (L) 05/10/2023 0939   PLT 293 05/10/2023 0939   MCV 86.1 05/10/2023 0939   MCH 28.4 05/10/2023 0939   MCHC 32.9 05/10/2023 0939   RDW 13.5 05/10/2023 0939   LYMPHSABS 0.4 (L) 05/10/2023 0939   MONOABS 0.4 05/10/2023 0939   EOSABS 0.0 05/10/2023 0939   BASOSABS 0.0 05/10/2023 0939       Latest Ref Rng & Units 05/10/2023    9:39 AM 04/05/2023   11:26 AM 03/16/2023    10:28 AM  CMP  Glucose 70 - 99 mg/dL 757  873  795   BUN 8 - 23 mg/dL 15  20  17    Creatinine 0.61 - 1.24 mg/dL 8.57  8.59  8.54   Sodium 135 - 145 mmol/L 127  138  139   Potassium 3.5 - 5.1 mmol/L 4.0  4.8  4.7   Chloride 98 - 111 mmol/L 96  103  104   CO2 22 - 32 mmol/L 18  25  26    Calcium  8.9 - 10.3 mg/dL 8.6  9.0  8.8   Total Protein 6.5 - 8.1 g/dL 7.6  8.0  7.2   Total Bilirubin 0.0 - 1.2 mg/dL 0.6  0.8  0.4   Alkaline Phos 38 - 126 U/L 58  63  56   AST 15 - 41 U/L 28  21  19    ALT 0 - 44 U/L 20  16  10        Microbiology: Recent Results (from the past 240 hours)  Aerobic Culture w Gram Stain (superficial specimen)     Status: None   Collection Time: 05/03/23 12:00 PM   Specimen: Wound  Result Value Ref Range Status   Specimen Description   Final    WOUND Performed at [facility], [address] [place], [place] [address]    Special Requests   Final    NONE Performed at [facility], [address] [place], [place] [address]    Gram Stain   Final    FEW WBC PRESENT, PREDOMINANTLY PMN FEW GRAM NEGATIVE RODS Performed at [facility], [address], [place], [place] [address]    Culture   Final    ABUNDANT ENTEROBACTER CLOACAE ABUNDANT KLEBSIELLA AEROGENES MODERATE STAPHYLOCOCCUS AUREUS    Report Status 05/07/2023 FINAL  Final   Organism ID, Bacteria ENTEROBACTER CLOACAE  Final   Organism ID, Bacteria KLEBSIELLA AEROGENES  Final   Organism ID, Bacteria STAPHYLOCOCCUS AUREUS  Final      Susceptibility   Enterobacter cloacae - MIC*    CEFEPIME  <=0.12 SENSITIVE Sensitive     CEFTAZIDIME <=1 SENSITIVE Sensitive     CIPROFLOXACIN  <=0.25 SENSITIVE Sensitive     GENTAMICIN  <=1 SENSITIVE Sensitive     IMIPENEM <=0.25 SENSITIVE Sensitive     TRIMETH /SULFA  <=20 SENSITIVE Sensitive     PIP/TAZO <=4 SENSITIVE Sensitive ug/mL    * ABUNDANT ENTEROBACTER CLOACAE   Staphylococcus aureus - MIC*    CIPROFLOXACIN  <=0.5 SENSITIVE Sensitive      ERYTHROMYCIN <=0.25 SENSITIVE Sensitive     GENTAMICIN  <=0.5 SENSITIVE Sensitive  OXACILLIN <=0.25 SENSITIVE Sensitive     TETRACYCLINE <=1 SENSITIVE Sensitive     VANCOMYCIN  1 SENSITIVE Sensitive     TRIMETH /SULFA  <=10 SENSITIVE Sensitive     CLINDAMYCIN <=0.25 SENSITIVE Sensitive     RIFAMPIN <=0.5 SENSITIVE Sensitive     Inducible Clindamycin NEGATIVE Sensitive     LINEZOLID 2 SENSITIVE Sensitive     * MODERATE STAPHYLOCOCCUS AUREUS   Klebsiella aerogenes - MIC*    CEFEPIME  <=0.12 SENSITIVE Sensitive     CEFTAZIDIME <=1 SENSITIVE Sensitive     CEFTRIAXONE  <=0.25 SENSITIVE Sensitive     CIPROFLOXACIN  <=0.25 SENSITIVE Sensitive     GENTAMICIN  <=1 SENSITIVE Sensitive     IMIPENEM 2 SENSITIVE Sensitive     TRIMETH /SULFA  <=20 SENSITIVE Sensitive     PIP/TAZO <=4 SENSITIVE Sensitive ug/mL    * ABUNDANT KLEBSIELLA AEROGENES  Resp panel by RT-PCR (RSV, Flu A&B, Covid) Anterior Nasal Swab     Status: None   Collection Time: 05/10/23 10:21 AM   Specimen: Anterior Nasal Swab  Result Value Ref Range Status   SARS Coronavirus 2 by RT PCR NEGATIVE NEGATIVE Final    Comment: (NOTE) SARS-CoV-2 target nucleic acids are NOT DETECTED.  The SARS-CoV-2 RNA is generally detectable in upper respiratory specimens during the acute phase of infection. The lowest concentration of SARS-CoV-2 viral copies this assay can detect is 138 copies/mL. A negative result does not preclude SARS-Cov-2 infection and should not be used as the sole basis for treatment or other patient management decisions. A negative result may occur with  improper specimen collection/handling, submission of specimen other than nasopharyngeal swab, presence of viral mutation(s) within the areas targeted by this assay, and inadequate number of viral copies(<138 copies/mL). A negative result must be combined with clinical observations, patient history, and epidemiological information. The expected result is Negative.  Fact Sheet  for Patients:  bloggercourse.com  Fact Sheet for Healthcare Providers:  seriousbroker.it  This test is no t yet approved or cleared by the [place]  FDA and  has been authorized for detection and/or diagnosis of SARS-CoV-2 by FDA under an Emergency Use Authorization (EUA). This EUA will remain  in effect (meaning this test can be used) for the duration of the COVID-19 declaration under Section 564(b)(1) of the Act, 21 U.S.C.section 360bbb-3(b)(1), unless the authorization is terminated  or revoked sooner.       Influenza A by PCR NEGATIVE NEGATIVE Final   Influenza B by PCR NEGATIVE NEGATIVE Final    Comment: (NOTE) The Xpert Xpress SARS-CoV-2/FLU/RSV plus assay is intended as an aid in the diagnosis of influenza from Nasopharyngeal swab specimens and should not be used as a sole basis for treatment. Nasal washings and aspirates are unacceptable for Xpert Xpress SARS-CoV-2/FLU/RSV testing.  Fact Sheet for Patients: bloggercourse.com  Fact Sheet for Healthcare Providers: seriousbroker.it  This test is not yet approved or cleared by the [place]  FDA and has been authorized for detection and/or diagnosis of SARS-CoV-2 by FDA under an Emergency Use Authorization (EUA). This EUA will remain in effect (meaning this test can be used) for the duration of the COVID-19 declaration under Section 564(b)(1) of the Act, 21 U.S.C. section 360bbb-3(b)(1), unless the authorization is terminated or revoked.     Resp Syncytial Virus by PCR NEGATIVE NEGATIVE Final    Comment: (NOTE) Fact Sheet for Patients: bloggercourse.com  Fact Sheet for Healthcare Providers: seriousbroker.it  This test is not yet approved or cleared by the [place]  FDA and has been authorized for  detection and/or diagnosis of SARS-CoV-2 by FDA under an  Emergency Use Authorization (EUA). This EUA will remain in effect (meaning this test can be used) for the duration of the COVID-19 declaration under Section 564(b)(1) of the Act, 21 U.S.C. section 360bbb-3(b)(1), unless the authorization is terminated or revoked.  Performed at [facility], [address] [place], [place] [address]     IMAGING RESULTS: Xray left foot- no osteo  I have personally reviewed the films ? Impression/Recommendation ?Diabetic foot infection Left foot with ulcer on the lateral margin TMA of the left foot Has neuropathy and PAD Recent culture was enterobacter, klebsiella and MSSA and he started ciprofloxacin  yesterday,Awaiting MRI Pt ion his feet at work every day and does not off load pressure off that foot  Now he is started on vanco , cefepime  and flagyl   CKD- watch vanco level closely- may switch to dapto  CAD s/p CABG  Afib on eliquis  and metoprolol       ________________________________________________ Discussed with patient, requesting provider

## 2023-05-10 NOTE — H&P (View-Only) (Signed)
 Hospital Consult    Reason for Consult:  Left foot Osteomyelitis with Cellulitis Requesting Physician:  Dr Elspeth Masters MD MRN #:  [id number]  History of Present Illness: This is a 73 y.o. male  with medical history significant of insulin -dependent DM 2, A-fib on Eliquis , HTN, CAD status post CABG, CKD 3B, PAD s/p transmetatarsal amputation left foot presenting w/ sepsis, suspected LLE cellulitis vs. Osteomyelitis. Pt reports worsening pain over L foot for multiple weeks.  Noted to have been admitted 03/2023 for issues including MSSA bacteremia, diabetic foot infection.Has had outpt follow up w/ ID and podiatry since discharge. Left foot exray today shows no evidence of acute osteomyelitis. It does show soft tissue thickening.   On exam today patient is resting comfortably on a stretcher in the emergency room.  Patient denies any chest pain or shortness of breath at this time.  Patient does endorse that his left lower extremity has worsened over the past 3 to 4 weeks.  Patient endorses he has had an angiogram of his lower extremities in the past so he understands the procedure going forward.  No other complaint today and vitals all remained stable.  Vascular surgery consulted to evaluate left lower extremity.  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   Sepsis (HCC) 08/17/2022   Tubular adenoma    Vitamin D  deficiency     Past Surgical History:  Procedure Laterality Date   AMPUTATION Left 08/19/2022   Procedure:  TRANSMETATARSAL AMPUTATION LEFT FOOT WITH IRRIGATION AND DEBRIDEMENT;  Surgeon: Lennie Barter, DPM;  Location: [facility];  Service: Podiatry;  Laterality: Left;   AMPUTATION TOE Right 06/01/2015   Procedure: AMPUTATION TOE;  Surgeon: Donnice Cory, DPM;  Location: [facility];  Service: Podiatry;  Laterality: Right;   AMPUTATION TOE Left 08/11/2022   Procedure: AMPUTATION TOE 2, 3, 4;  Surgeon: [name], DPM;  Location: [facility];  Service: Podiatry;  Laterality: Left;   CATARACT EXTRACTION, BILATERAL     CIRCUMCISION N/A 06/12/2022   Procedure: CIRCUMCISION ADULT;  Surgeon: Penne Knee, MD;  Location: [facility];  Service: Urology;  Laterality: N/A;   COLONOSCOPY WITH PROPOFOL  N/A 02/14/2016   Procedure: COLONOSCOPY WITH PROPOFOL ;  Surgeon: Gladis RAYMOND Mariner, MD;  Location: [facility] ENDOSCOPY;  Service: Endoscopy;  Laterality: N/A;   COLONOSCOPY WITH PROPOFOL  N/A 01/07/2019   Procedure: COLONOSCOPY WITH PROPOFOL ;  Surgeon: Mariner Gladis RAYMOND, MD;  Location: [facility] ENDOSCOPY;  Service: Endoscopy;  Laterality: N/A;   CORONARY ARTERY BYPASS GRAFT N/A 08/2012   EXCISION PARTIAL PHALANX Right 06/01/2015   Procedure: EXCISION PARTIAL PHALANX /  BONE;  Surgeon: Donnice Cory, DPM;  Location: [facility];  Service: Podiatry;  Laterality: Right;   FLEXIBLE SIGMOIDOSCOPY N/A 05/29/2016   Procedure: FLEXIBLE SIGMOIDOSCOPY;  Surgeon: Gladis RAYMOND Mariner, MD;  Location: [facility] ENDOSCOPY;  Service: Endoscopy;  Laterality: N/A;   INCISION AND DRAINAGE Left 08/23/2022   Procedure: INCISION AND DRAINAGE;  Surgeon: Ashley,  Eva, DPM;  Location: [facility];  Service: Podiatry;  Laterality: Left;   INCISION AND DRAINAGE OF WOUND Left 09/15/2022   Procedure: 11044 - DEBRIDE BONE and EXCISION IF 1ST METATARSAL BONE WITH  DELAY PRIMARY CLOSURE;  Surgeon: Ashley Eva, DPM;  Location: [facility];  Service: Podiatry;  Laterality: Left;   IRRIGATION AND DEBRIDEMENT FOOT Left 02/28/2023   Procedure: IRRIGATION AND DEBRIDEMENT  FOOT;  Surgeon: Ashley Eva, DPM;  Location: [facility];  Service: Orthopedics/Podiatry;  Laterality: Left;   KNEE ARTHROSCOPY Left    LOWER EXTREMITY ANGIOGRAPHY Left 08/22/2022   Procedure: Lower Extremity Angiography;  Surgeon: Jama Cordella MATSU, MD;  Location: [facility];  Service: Cardiovascular;  Laterality: Left;   TEE WITHOUT CARDIOVERSION N/A 03/01/2023   Procedure: TRANSESOPHAGEAL ECHOCARDIOGRAM;  Surgeon: Alluri, Keller BROCKS, MD;  Location: [facility];  Service: Cardiovascular;  Laterality: N/A;   WOUND DEBRIDEMENT Left 09/15/2022   Procedure: 11043 - DEBRIDE SKIN. MUSCLE FASCIA;  Surgeon: Ashley Eva, DPM;  Location: [facility];  Service: Podiatry;  Laterality: Left;    No Known Allergies  Prior to Admission medications   Medication Sig Start Date End Date Taking? Authorizing Provider  acetaminophen  (TYLENOL ) 325 MG tablet Take 1-2 tablets (325-650 mg total) by mouth every 4 (four) hours as needed for mild pain. 08/30/22   Love, Sharlet RAMAN, PA-C  apixaban  (ELIQUIS ) 5 MG TABS tablet Take 1 tablet (5 mg total) by mouth 2 (two) times daily. 04/30/23   Brown, Fallon E, NP  Bacillus Coagulans-Inulin (ALIGN PREBIOTIC-PROBIOTIC) 5-1.25 MG-GM CHEW Chew 1 tablet by mouth as directed. 03/05/23   Fausto Burnard LABOR, DO  ciprofloxacin  (CIPRO ) 500 MG tablet Take 1 tablet (500 mg total) by mouth 2 (two) times daily. 05/09/23   Fayette Bodily, MD  clopidogrel  (PLAVIX ) 75 MG tablet TAKE 1 TABLET BY MOUTH EVERY DAY WITH BREAKFAST 04/24/23   Brown, Fallon E, NP  Continuous Glucose Sensor (FREESTYLE LIBRE 2 SENSOR) MISC  09/05/22   [provider]  docusate sodium  (COLACE) 100 MG capsule Take 1 capsule (100 mg total) by mouth 2 (two) times daily. 09/05/22   Love, Sharlet RAMAN, PA-C  gabapentin  (NEURONTIN ) 300 MG capsule Take 1 capsule (300 mg total) by mouth 3 (three) times daily. 09/05/22   Love, Sharlet RAMAN, PA-C  glucose blood (ONETOUCH ULTRA) test strip Use 3 (three) times daily 09/09/18    [provider]  insulin  glargine (LANTUS ) 100 UNIT/ML Solostar Pen Inject 36 Units into the skin at bedtime. Patient taking differently: Inject 26 Units into the skin at bedtime. 09/05/22   Love, Sharlet RAMAN, PA-C  Insulin  Pen Needle (FIFTY50 PEN NEEDLES) 32G X 4 MM MISC USE 4 TIMES DAILY 07/29/18   [provider]  metFORMIN  (GLUCOPHAGE -XR) 500 MG 24 hr tablet Take 2,000 mg by mouth every evening. Take with dinner.    [provider]  metoprolol  tartrate (LOPRESSOR ) 25 MG tablet Take 1 tablet (25 mg total) by mouth 2 (two) times daily. 09/05/22   Love, Sharlet RAMAN, PA-C  ondansetron  (ZOFRAN ) 4 MG tablet Take 1 tablet (4 mg total) by mouth every 6 (six) hours as needed for nausea or vomiting. 03/05/23   Fausto Burnard A, DO  OZEMPIC, 0.25 OR 0.5 MG/DOSE, 2 MG/3ML SOPN Inject 0.75 mLs into the skin once a week.    [provider]  pravastatin  (PRAVACHOL ) 80 MG tablet TAKE ONE TABLET BY MOUTH AT NIGHT 07/08/18   [provider]  traZODone  (DESYREL ) 50 MG tablet TAKE ONE  TABLET AT BEDTIME AS NEEDED 10/21/18   [provider]    Social History   Socioeconomic History   Marital status: Divorced    Spouse name: Ashley,Angela C   Number of children: Not on file   Years of education: Not on file   Highest education level: Not on file  Occupational History   Not on file  Tobacco Use   Smoking status: Former    Types: Cigars   Smokeless tobacco: Never  Vaping Use   Vaping status: Never Used  Substance and Sexual Activity   Alcohol use: No   Drug use: No   Sexual activity: Never  Other Topics Concern   Not on file  Social History Narrative   Patient is legally separated and lives at home by himself. His ex -wife is his HCPOA. Neighbors and friends are his support system. He used to work for [employer]    Social Drivers of Corporate Investment Banker Strain: Low Risk  (12/13/2022)   Received from [facility]    Overall Financial Resource Strain (CARDIA)    Difficulty of Paying Living Expenses: Not hard at all  Food Insecurity: No Food Insecurity (02/27/2023)   Hunger Vital Sign    Worried About Running Out of Food in the Last Year: Never true    Ran Out of Food in the Last Year: Never true  Transportation Needs: No Transportation Needs (02/27/2023)   PRAPARE - Administrator, Civil Service (Medical): No    Lack of Transportation (Non-Medical): No  Physical Activity: Not on file  Stress: Not on file  Social Connections: Not on file  Intimate Partner Violence: Not At Risk (02/27/2023)   Humiliation, Afraid, Rape, and Kick questionnaire    Fear of Current or Ex-Partner: No    Emotionally Abused: No    Physically Abused: No    Sexually Abused: No     Family History  Problem Relation Age of Onset   Diabetes Mellitus II Mother    CAD Mother     ROS: Otherwise negative unless mentioned in HPI  Physical Examination  Vitals:   05/10/23 1130 05/10/23 1211  BP: 116/67   Pulse: 82   Resp: (!) 22   Temp:  99.1 F (37.3 C)  SpO2: 99%    Body mass index is 27.83 kg/m.  General:  WDWN in NAD Gait: Not observed HENT: WNL, normocephalic Pulmonary: normal non-labored breathing, without Rales, rhonchi,  wheezing Cardiac: regular, without  Murmurs, rubs or gallops; without carotid bruits Abdomen: Positive bowel sounds throughout, soft, NT/ND, no masses Skin: without rashes Vascular Exam/Pulses: Right lower extremity warm to touch with palpable pulses.  Left lower extremity with recent TMA and gangrenous sores to the lateral aspect of the foot.  Doppler pulses only. Extremities: with ischemic changes, with Gangrene , with cellulitis; with open wounds;  Musculoskeletal: no muscle wasting or atrophy  Neurologic: A&O X 3;  No focal weakness or paresthesias are detected; speech is fluent/normal Psychiatric:  The pt has Normal affect. Lymph:  Unremarkable  CBC    Component  Value Date/Time   WBC 10.8 (H) 05/10/2023 0939   RBC 4.02 (L) 05/10/2023 0939   HGB 11.4 (L) 05/10/2023 0939   HCT 34.6 (L) 05/10/2023 0939   PLT 293 05/10/2023 0939   MCV 86.1 05/10/2023 0939   MCH 28.4 05/10/2023 0939   MCHC 32.9 05/10/2023 0939   RDW 13.5 05/10/2023 0939   LYMPHSABS 0.4 (L)  05/10/2023 0939   MONOABS 0.4 05/10/2023 0939   EOSABS 0.0 05/10/2023 0939   BASOSABS 0.0 05/10/2023 0939    BMET    Component Value Date/Time   NA 127 (L) 05/10/2023 0939   K 4.0 05/10/2023 0939   CL 96 (L) 05/10/2023 0939   CO2 18 (L) 05/10/2023 0939   GLUCOSE 242 (H) 05/10/2023 0939   GLUCOSE 402 (H) 09/17/2012 1417   BUN 15 05/10/2023 0939   CREATININE 1.42 (H) 05/10/2023 0939   CALCIUM  8.6 (L) 05/10/2023 0939   GFRNONAA 52 (L) 05/10/2023 0939   GFRAA 58 (L) 06/02/2015 0656    COAGS: Lab Results  Component Value Date   INR 1.3 (H) 05/10/2023   INR 1.6 (H) 02/27/2023   INR 1.6 (H) 02/26/2023     Non-Invasive Vascular Imaging:   EXAM:05/10/2023 LEFT FOOT - COMPLETE 3+ VIEW   COMPARISON:  Radiograph dated 02/26/2023.   FINDINGS: Status post transmetatarsal amputation of the foot. There is no acute fracture or dislocation. The bones are osteopenic. There is degenerative changes of the tarsometatarsal joints. No erosive changes or periosteal elevation to suggest acute osteomyelitis. Soft tissue thickening of the stump of the forefoot with irregularity of the skin over the medial stump. No soft tissue gas. Vascular calcifications.   IMPRESSION: 1. Status post transmetatarsal amputation of the foot. No radiographic evidence of acute osteomyelitis. 2. Soft tissue thickening of the stump of the forefoot with irregularity of the skin over the medial stump. No soft tissue gas.  Statin:  No. Beta Blocker:  Yes.   Aspirin :  No. ACEI:  No. ARB:  No. CCB use:  No Other antiplatelets/anticoagulants:  Yes.   Eliquis  5 mg BID    ASSESSMENT/PLAN: This is a 73 y.o. male  who presents to [facility] emergency department for worsening pain of the left foot diabetic ulcerations/wounds.  Patient being followed by podiatry antibiotics for ID culture showing Enterobacter, Klebsiella and MSSA.  Please see photos in the media file.  Vascular surgery plans on taking the patient to the vascular lab tomorrow 05/11/2023 for left lower extremity angiogram with possible intervention.  I discussed in detail with the patient the procedure, benefits, risk, complications.  He verbalizes understanding and wishes to proceed to soon as possible.  I answered all the patient's questions this afternoon.  Patient will be made n.p.o. after midnight for procedure tomorrow.   -I discussed the plan in detail with Dr. Selinda Gu MD and he agrees with plan.   [name] [facility] 05/10/2023 1:33 PM

## 2023-05-10 NOTE — H&P (Signed)
 History and Physical    [name] FMW:[id number] DOB: [birth year] DOA: 05/10/2023 DOS: the patient was seen and examined on 05/10/2023 PCP: Fernande Ophelia [name], MD  Patient coming from: Home  Chief Complaint:  Chief Complaint  Patient presents with   Dizziness   HPI: [name] is a 73 y.o. male with medical history significant of insulin -dependent DM 2, A-fib on Eliquis , HTN, CAD status post CABG, CKD 3B, PAD s/p transmetatarsal amputation left foot presenting w/ sepsis, suspected LLE cellulitis vs. Osteomyelitis. Pt reports worsening pain over L foot for multiple weeks.  Noted to have been admitted 03/2023 for issues including MSSA bacteremia, diabetic foot infection.Has had outpt follow up w/ ID and podiatry since discharge. Noted recent evaluation w/ ID  cultures showing enterobacter, klebsiella and MSSA in the recent culture taken from the lateral wound- transitioned from cefadroxil  to cipro . No reported medical noncompliance. Had follow up with podiatry 05/03/2023 w/ plan for MRI and vascular referral. Pt does report generalized malaise. Baseline type 2 DM, on insulin . Not checking blood sugars at home. No reported tobacco and etoh use.  Presented to ER T max 100.5, HR 100s, BP stable. Satting well on RA. WBC 10.8, hgb 11.4, Na 127, Cr 1.42. Glu 242. Lactate 2.3 -->1.1. PCT 0.43. L foot plain films w/ soft tissue swelling. CXR WNL.  Review of Systems: As mentioned in the history of present illness. All other systems reviewed and are negative. Past Medical History:  Diagnosis Date   AKI (acute kidney injury) (HCC) 09/05/2022   Atrial fibrillation with RVR (HCC) 08/19/2022   Bladder neck obstruction    Chronic kidney disease    Coronary artery disease    a.) s/p 4v CABG in 2014   Diabetes mellitus without complication (HCC)    Diabetic neuropathy (HCC)    Diabetic peripheral neuropathy (HCC)    Diverticulosis    Gout    Heart murmur    Hypercholesteremia    Hyperlipidemia     Hypertension    Peripheral neuropathy    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   Sepsis (HCC) 08/17/2022   Tubular adenoma    Vitamin D  deficiency    Past Surgical History:  Procedure Laterality Date   AMPUTATION Left 08/19/2022   Procedure: TRANSMETATARSAL AMPUTATION LEFT FOOT WITH IRRIGATION AND DEBRIDEMENT;  Surgeon: Lennie Barter, DPM;  Location: [facility];  Service: Podiatry;  Laterality: Left;   AMPUTATION TOE Right 06/01/2015   Procedure: AMPUTATION TOE;  Surgeon: Donnice Cory, DPM;  Location: [facility];  Service: Podiatry;  Laterality: Right;   AMPUTATION TOE Left 08/11/2022   Procedure: AMPUTATION TOE 2, 3, 4;  Surgeon: [name], DPM;  Location: [facility];  Service: Podiatry;  Laterality: Left;   CATARACT EXTRACTION, BILATERAL     CIRCUMCISION N/A 06/12/2022   Procedure: CIRCUMCISION ADULT;  Surgeon: Penne Knee, MD;  Location: [facility];  Service: Urology;  Laterality: N/A;   COLONOSCOPY WITH PROPOFOL  N/A 02/14/2016   Procedure: COLONOSCOPY WITH PROPOFOL ;  Surgeon: Gladis RAYMOND Mariner, MD;  Location: [facility] ENDOSCOPY;  Service: Endoscopy;  Laterality: N/A;   COLONOSCOPY WITH PROPOFOL  N/A 01/07/2019   Procedure: COLONOSCOPY WITH PROPOFOL ;  Surgeon: Mariner Gladis RAYMOND, MD;  Location: [facility] ENDOSCOPY;  Service: Endoscopy;  Laterality: N/A;   CORONARY ARTERY BYPASS GRAFT N/A 08/2012   EXCISION PARTIAL PHALANX Right 06/01/2015   Procedure: EXCISION PARTIAL PHALANX /  BONE;  Surgeon: Donnice Cory, DPM;  Location: [facility];  Service: Podiatry;  Laterality: Right;  FLEXIBLE SIGMOIDOSCOPY N/A 05/29/2016   Procedure: FLEXIBLE SIGMOIDOSCOPY;  Surgeon: Gladis RAYMOND Mariner, MD;  Location: [facility] ENDOSCOPY;  Service: Endoscopy;  Laterality: N/A;   INCISION AND DRAINAGE Left 08/23/2022   Procedure: INCISION AND DRAINAGE;  Surgeon: [name], DPM;  Location: [facility];  Service: Podiatry;  Laterality: Left;   INCISION AND DRAINAGE OF WOUND Left 09/15/2022   Procedure: 11044 - DEBRIDE BONE and EXCISION IF  1ST METATARSAL BONE WITH  DELAY PRIMARY CLOSURE;  Surgeon: [name], DPM;  Location: [facility];  Service: Podiatry;  Laterality: Left;   IRRIGATION AND DEBRIDEMENT FOOT Left 02/28/2023   Procedure: IRRIGATION AND DEBRIDEMENT FOOT;  Surgeon: [name], DPM;  Location: [facility];  Service: Orthopedics/Podiatry;  Laterality: Left;   KNEE ARTHROSCOPY Left    LOWER EXTREMITY ANGIOGRAPHY Left 08/22/2022   Procedure: Lower Extremity Angiography;  Surgeon: Jama Cordella MATSU, MD;  Location: [facility];  Service: Cardiovascular;  Laterality: Left;   TEE WITHOUT CARDIOVERSION N/A 03/01/2023   Procedure: TRANSESOPHAGEAL ECHOCARDIOGRAM;  Surgeon: Alluri, Keller BROCKS, MD;  Location: [facility];  Service: Cardiovascular;  Laterality: N/A;   WOUND DEBRIDEMENT Left 09/15/2022   Procedure: 11043 - DEBRIDE SKIN. MUSCLE FASCIA;  Surgeon: [name], DPM;  Location: [facility];  Service: Podiatry;  Laterality: Left;   Social History:  reports that he has quit smoking. His smoking use included cigars. He has never used smokeless tobacco. He reports that he does not drink alcohol and does not use drugs.  No Known Allergies  Family History  Problem Relation Age of Onset   Diabetes Mellitus II Mother    CAD Mother     Prior to Admission medications   Medication Sig Start Date End Date Taking? Authorizing Provider  acetaminophen  (TYLENOL ) 325 MG tablet Take 1-2 tablets (325-650 mg total) by mouth every 4 (four) hours as needed for mild pain. 08/30/22   Love, Sharlet RAMAN, PA-C  apixaban  (ELIQUIS ) 5 MG TABS tablet Take 1 tablet (5 mg total) by mouth 2 (two) times daily. 04/30/23   Brown, Fallon E, NP  Bacillus Coagulans-Inulin (ALIGN PREBIOTIC-PROBIOTIC) 5-1.25 MG-GM CHEW Chew 1 tablet by mouth as directed. 03/05/23   Fausto Burnard LABOR, DO  ciprofloxacin  (CIPRO ) 500 MG tablet Take 1 tablet (500 mg total) by mouth 2 (two) times daily. 05/09/23   Fayette Bodily, MD  clopidogrel  (PLAVIX ) 75 MG tablet  TAKE 1 TABLET BY MOUTH EVERY DAY WITH BREAKFAST 04/24/23   Brown, Fallon E, NP  Continuous Glucose Sensor (FREESTYLE LIBRE 2 SENSOR) MISC  09/05/22   [provider]  docusate sodium  (COLACE) 100 MG capsule Take 1 capsule (100 mg total) by mouth 2 (two) times daily. 09/05/22   Love, Sharlet RAMAN, PA-C  gabapentin  (NEURONTIN ) 300 MG capsule Take 1 capsule (300 mg total) by mouth 3 (three) times daily. 09/05/22   Love, Sharlet RAMAN, PA-C  glucose blood (ONETOUCH ULTRA) test strip Use 3 (three) times daily 09/09/18   [provider]  insulin  glargine (LANTUS ) 100 UNIT/ML Solostar Pen Inject 36 Units into the skin at bedtime. Patient taking differently: Inject 26 Units into the skin at bedtime. 09/05/22   Love, Sharlet RAMAN, PA-C  Insulin  Pen Needle (FIFTY50 PEN NEEDLES) 32G X 4 MM MISC USE 4 TIMES DAILY 07/29/18   [provider]  metFORMIN  (GLUCOPHAGE -XR) 500 MG 24 hr tablet Take 2,000 mg by mouth every evening. Take with dinner.    [provider]  metoprolol  tartrate (LOPRESSOR ) 25 MG tablet Take 1 tablet (25  mg total) by mouth 2 (two) times daily. 09/05/22   Love, Sharlet RAMAN, PA-C  ondansetron  (ZOFRAN ) 4 MG tablet Take 1 tablet (4 mg total) by mouth every 6 (six) hours as needed for nausea or vomiting. 03/05/23   Fausto Sor A, DO  OZEMPIC, 0.25 OR 0.5 MG/DOSE, 2 MG/3ML SOPN Inject 0.75 mLs into the skin once a week.    [provider]  pravastatin  (PRAVACHOL ) 80 MG tablet TAKE ONE TABLET BY MOUTH AT NIGHT 07/08/18   [provider]  traZODone  (DESYREL ) 50 MG tablet TAKE ONE TABLET AT BEDTIME AS NEEDED 10/21/18   [provider]    Physical Exam: Vitals:   05/10/23 1030 05/10/23 1100 05/10/23 1130 05/10/23 1211  BP: 137/78 124/66 116/67   Pulse: (!) 105 93 82   Resp: 20 (!) 23 (!) 22   Temp:    99.1 F (37.3 C)  TempSrc:    Oral  SpO2: 100% 98% 99%   Weight:      Height:       Physical Exam Constitutional:      Appearance: He is obese.  HENT:      Head: Normocephalic and atraumatic.     Nose: Nose normal.  Eyes:     Pupils: Pupils are equal, round, and reactive to light.  Cardiovascular:     Rate and Rhythm: Normal rate and regular rhythm.  Pulmonary:     Effort: Pulmonary effort is normal.  Abdominal:     General: Bowel sounds are normal.  Musculoskeletal:     Comments: S/p L TMA   Skin:    General: Skin is warm.     Comments: See pic   Neurological:     General: No focal deficit present.  Psychiatric:        Mood and Affect: Mood normal.     Data Reviewed:  There are no new results to review at this time.  DG Foot Complete Left CLINICAL DATA:  Wound in the lateral left foot. Concern for osteomyelitis or free air.  EXAM: LEFT FOOT - COMPLETE 3+ VIEW  COMPARISON:  Radiograph dated 02/26/2023.  FINDINGS: Status post transmetatarsal amputation of the foot. There is no acute fracture or dislocation. The bones are osteopenic. There is degenerative changes of the tarsometatarsal joints. No erosive changes or periosteal elevation to suggest acute osteomyelitis. Soft tissue thickening of the stump of the forefoot with irregularity of the skin over the medial stump. No soft tissue gas. Vascular calcifications.  IMPRESSION: 1. Status post transmetatarsal amputation of the foot. No radiographic evidence of acute osteomyelitis. 2. Soft tissue thickening of the stump of the forefoot with irregularity of the skin over the medial stump. No soft tissue gas.  Electronically Signed   By: Vanetta Chou M.D.   On: 05/10/2023 11:07 DG Chest Port 1 View CLINICAL DATA:  [id number] Sepsis [facility]) [id number]  EXAM: PORTABLE CHEST 1 VIEW  COMPARISON:  CXR 02/26/23  FINDINGS: Assessment is slightly limited due to patient positioning.  No pleural effusion. No pneumothorax. Likely unchanged cardiac and mediastinal contours. Status post median sternotomy and CABG. No focal airspace opacity. No radiographically apparent  displaced rib fractures. Visualized upper abdomen is unremarkable.  IMPRESSION: No focal airspace opacity  Electronically Signed   By: Lyndall Gore M.D.   On: 05/10/2023 10:04  Lab Results  Component Value Date   WBC 10.8 (H) 05/10/2023   HGB 11.4 (L) 05/10/2023   HCT 34.6 (L) 05/10/2023   MCV 86.1 05/10/2023  PLT 293 05/10/2023   Last metabolic panel Lab Results  Component Value Date   GLUCOSE 242 (H) 05/10/2023   NA 127 (L) 05/10/2023   K 4.0 05/10/2023   CL 96 (L) 05/10/2023   CO2 18 (L) 05/10/2023   BUN 15 05/10/2023   CREATININE 1.42 (H) 05/10/2023   GFRNONAA 52 (L) 05/10/2023   CALCIUM  8.6 (L) 05/10/2023   PROT 7.6 05/10/2023   ALBUMIN 3.6 05/10/2023   BILITOT 0.6 05/10/2023   ALKPHOS 58 05/10/2023   AST 28 05/10/2023   ALT 20 05/10/2023   ANIONGAP 13 05/10/2023    Assessment and Plan: Sepsis (HCC) Meeting sepsis criteria based on T 100.5, HR 100s WBC 10.8  Lactate 2.3 PCT 0.48  Some concern for bone and soft tissue source of L foot w/ baseline ulceration followed by podiatry  Will panculture  IV cefepime , flagyl  and vancomycin  for broad spectrum coverage  MRI L foot pending  Podiatry consult as clinically indicated    Diabetic foot infection with possible osteomyelitis, left  (HCC) Noted sepsis w/ concern for active infection in LLE  Noted prior TMA of L foot  WBC 10.8, T 100.5  Also recently seen by ID via Dr. Fayette 05/08/2022 with cultures showing enterobacter, klebsiella and MSSA in the recent culture taken from the lateral wound.  Transitioned to cipo po  L foot plain films grossly stable  MRI foot pending  Blood cultures drawn  On broadened abx in setting of sepsis  Follow    Atrial fibrillation (HCC) EKG atrial fibrillation w/ HR in low 100s  Cont eliquis  and metoprolol   Monitor    Mixed hyperlipidemia Cont statin    Coronary artery disease Baseline CAD s/p CABG  No active CP  Cont home regimen    CKD (chronic kidney  disease) stage 3, GFR 30-59 ml/min (HCC) Cr 1.4 w/ GFR in 50s  At baseline    Benign essential hypertension BP stable  Titrate home regimen    Type 2 diabetes mellitus with stage 3b chronic kidney disease, with long-term current use of insulin  (HCC) SSI  Long acting insulin   A1C  Monitor       Advance Care Planning:   Code Status: Prior   Consults: ID, Podiatry, vascular surgery   Family Communication: No family at the bedside   Severity of Illness: The appropriate patient status for this patient is INPATIENT. Inpatient status is judged to be reasonable and necessary in order to provide the required intensity of service to ensure the patient's safety. The patient's presenting symptoms, physical exam findings, and initial radiographic and laboratory data in the context of their chronic comorbidities is felt to place them at high risk for further clinical deterioration. Furthermore, it is not anticipated that the patient will be medically stable for discharge from the hospital within 2 midnights of admission.   * I certify that at the point of admission it is my clinical judgment that the patient will require inpatient hospital care spanning beyond 2 midnights from the point of admission due to high intensity of service, high risk for further deterioration and high frequency of surveillance required.*  Author: Elspeth JINNY Masters, MD 05/10/2023 12:54 PM  For on call review [email or web address].

## 2023-05-10 NOTE — ED Triage Notes (Signed)
 Pt coming from wound care center due to dizziness, pt was at wound care for wound of L foot, started ABX yesterday. CBG 190. 164/89, HR 130, 97% RA per EMS

## 2023-05-10 NOTE — ED Provider Notes (Signed)
 [facility] Provider Note    Event Date/Time   First MD Initiated Contact with Patient 05/10/23 [phone]     (approximate)   History   Dizziness   HPI  Andrew Harding is a 73 y.o. male who presents to the ED for evaluation of Dizziness   I review a podiatry clinic visit from 1 week ago.  Diabetic patient with ulcerations in the left foot.  Otherwise history of A-fib, CAD s/p CABG.  Anticoagulated on Eliquis . Also review ID visit from the same day.  Medical admission October, blood cultures with MSSA.  He is on cefadroxil   Patient presents for evaluation of an episode of presyncopal dizziness at the wound care clinic.  Reports going to the wound care clinic this morning as scheduled but he was referred to us  from the clinic because of his symptoms of dizziness.  He reports compliance with his antibiotics and prescription medications, except he has not yet taken his a.m. medications today.  Did not know he had a fever.  Denies any other focal symptoms.  No trauma or pain to his foot, no abdominal pain, chest pain, emesis or shortness of breath.  Physical Exam   Triage Vital Signs: ED Triage Vitals  Encounter Vitals Group     BP 05/10/23 0934 (!) 143/73     Systolic BP Percentile --      Diastolic BP Percentile --      Pulse Rate 05/10/23 0930 96     Resp 05/10/23 0930 16     Temp 05/10/23 0935 (!) 100.5 F (38.1 C)     Temp Source 05/10/23 0935 Oral     SpO2 05/10/23 0930 97 %     Weight 05/10/23 0931 222 lb 10.6 oz (101 kg)     Height 05/10/23 0931 6' 3 (1.905 m)     Head Circumference --      Peak Flow --      Pain Score 05/10/23 0939 0     Pain Loc --      Pain Education --      Exclude from Growth Chart --     Most recent vital signs: Vitals:   05/10/23 1100 05/10/23 1130  BP: 124/66 116/67  Pulse: 93 82  Resp: (!) 23 (!) 22  Temp:    SpO2: 98% 99%    General: Awake, no distress.  CV:  Good peripheral perfusion.  Resp:  Normal  effort.  Abd:  No distention.  MSK:  No deformity noted.  Neuro:  No focal deficits appreciated. Other:  As pictured below, left foot with chronic wound on the lateral aspect with some mild surrounding erythema.  He reports it seems normal    ED Results / Procedures / Treatments   Labs (all labs ordered are listed, but only abnormal results are displayed) Labs Reviewed  COMPREHENSIVE METABOLIC PANEL - Abnormal; Notable for the following components:      Result Value   Sodium 127 (*)    Chloride 96 (*)    CO2 18 (*)    Glucose, Bld 242 (*)    Creatinine, Ser 1.42 (*)    Calcium  8.6 (*)    GFR, Estimated 52 (*)    All other components within normal limits  LACTIC ACID, PLASMA - Abnormal; Notable for the following components:   Lactic Acid, Venous 2.3 (*)    All other components within normal limits  CBC WITH DIFFERENTIAL/PLATELET - Abnormal; Notable for the following components:  WBC 10.8 (*)    RBC 4.02 (*)    Hemoglobin 11.4 (*)    HCT 34.6 (*)    Neutro Abs 10.0 (*)    Lymphs Abs 0.4 (*)    All other components within normal limits  PROTIME-INR - Abnormal; Notable for the following components:   Prothrombin Time 16.8 (*)    INR 1.3 (*)    All other components within normal limits  URINALYSIS, W/ REFLEX TO CULTURE (INFECTION SUSPECTED) - Abnormal; Notable for the following components:   Color, Urine YELLOW (*)    APPearance HAZY (*)    Glucose, UA 50 (*)    Hgb urine dipstick MODERATE (*)    Protein, ur 100 (*)    All other components within normal limits  RESP PANEL BY RT-PCR (RSV, FLU A&B, COVID)  [id number]  CULTURE, BLOOD (ROUTINE X 2)  CULTURE, BLOOD (ROUTINE X 2)  LACTIC ACID, PLASMA  PROCALCITONIN    EKG A-fib RVR with rate of 108 bpm.  Normal axis and intervals.  No STEMI.  RADIOLOGY CXR interpreted by me without evidence of acute cardiopulmonary pathology. Plain film of the left foot interpreted by me without signs of osteomyelitis, fracture or  dislocation  Official radiology report(s): DG Foot Complete Left Result Date: 05/10/2023 CLINICAL DATA:  Wound in the lateral left foot. Concern for osteomyelitis or free air. EXAM: LEFT FOOT - COMPLETE 3+ VIEW COMPARISON:  Radiograph dated 02/26/2023. FINDINGS: Status post transmetatarsal amputation of the foot. There is no acute fracture or dislocation. The bones are osteopenic. There is degenerative changes of the tarsometatarsal joints. No erosive changes or periosteal elevation to suggest acute osteomyelitis. Soft tissue thickening of the stump of the forefoot with irregularity of the skin over the medial stump. No soft tissue gas. Vascular calcifications. IMPRESSION: 1. Status post transmetatarsal amputation of the foot. No radiographic evidence of acute osteomyelitis. 2. Soft tissue thickening of the stump of the forefoot with irregularity of the skin over the medial stump. No soft tissue gas. Electronically Signed   By: Vanetta Chou M.D.   On: 05/10/2023 11:07   DG Chest Port 1 View Result Date: 05/10/2023 CLINICAL DATA:  [id number] Sepsis [facility]) [id number] EXAM: PORTABLE CHEST 1 VIEW COMPARISON:  CXR 02/26/23 FINDINGS: Assessment is slightly limited due to patient positioning. No pleural effusion. No pneumothorax. Likely unchanged cardiac and mediastinal contours. Status post median sternotomy and CABG. No focal airspace opacity. No radiographically apparent displaced rib fractures. Visualized upper abdomen is unremarkable. IMPRESSION: No focal airspace opacity Electronically Signed   By: Lyndall Gore M.D.   On: 05/10/2023 10:04    PROCEDURES and INTERVENTIONS:  .Critical Care  Performed by: Claudene Rover, MD Authorized by: Claudene Rover, MD   Critical care provider statement:    Critical care time (minutes):  30   Critical care time was exclusive of:  Separately billable procedures and treating other patients   Critical care was necessary to treat or prevent imminent or life-threatening  deterioration of the following conditions:  Circulatory failure and sepsis   Critical care was time spent personally by me on the following activities:  Development of treatment plan with patient or surrogate, discussions with consultants, evaluation of patient's response to treatment, examination of patient, ordering and review of laboratory studies, ordering and review of radiographic studies, ordering and performing treatments and interventions, pulse oximetry, re-evaluation of patient's condition and review of old charts .1-3 Lead EKG Interpretation  Performed by: Claudene Rover, MD Authorized by: Claudene Rover, MD  Interpretation: abnormal     ECG rate:  110   ECG rate assessment: tachycardic     Rhythm: atrial fibrillation     Ectopy: none     Conduction: normal     Medications  vancomycin  (VANCOREADY) IVPB 2000 mg/400 mL (has no administration in time range)  acetaminophen  (TYLENOL ) tablet 1,000 mg (1,000 mg Oral Given 05/10/23 1005)  sodium chloride  0.9 % bolus 1,000 mL (0 mLs Intravenous Stopped 05/10/23 1108)  apixaban  (ELIQUIS ) tablet 5 mg (5 mg Oral Given 05/10/23 1105)  metoprolol  tartrate (LOPRESSOR ) tablet 25 mg (25 mg Oral Given 05/10/23 1105)  ceFEPIme  (MAXIPIME ) 2 g in sodium chloride  0.9 % 100 mL IVPB (0 g Intravenous Stopped 05/10/23 1141)     IMPRESSION / MDM / ASSESSMENT AND PLAN / ED COURSE  I reviewed the triage vital signs and the nursing notes.  Differential diagnosis includes, but is not limited to, sepsis, bacteremia, osteomyelitis or cellulitis, viral syndrome such as flu or COVID  {Patient presents with symptoms of an acute illness or injury that is potentially life-threatening.  Patient on antibiotics for previous bacteremia and osteomyelitis presents with presyncopal dizziness, noted to meet SIRS/sepsis criteria.  Low-grade temperature and rapid A-fib.  Minimal leukocytosis of 10.8.  Hyponatremia without AKI.  Hyperglycemia without DKA.  Mild lactic  acidosis.  Cultures are drawn and we will provide broad-spectrum antibiotics.  Awaiting x-ray to assess for signs of osteomyelitis on his foot is the most likely etiology of his fever.  Will also send a COVID/flu test to evaluate for possible viral etiologies.  Has not yet taken his a.m. medications and he is in mildly rapid A-fib in the 100s-110s.  We will give his morning dose of Eliquis , metoprolol  and a small dose of IV metoprolol  as well.  A-fib remains well-controlled with oral medications.  Viral screening swab returns negative.  I am concerned about recurrence of the possibility of bacteremia.  Consult with medicine for admission.  Clinical Course as of 05/10/23 1145  Thu May 10, 2023  1138 Reassessed and discussed my concerns. Recommend admission, he's agreeable. Remains stable, no shock [DS]  1145 Consult with medicine who agrees to admit. [DS]    Clinical Course User Index [DS] Claudene Rover, MD     FINAL CLINICAL IMPRESSION(S) / ED DIAGNOSES   Final diagnoses:  None     Rx / DC Orders   ED Discharge Orders     None        Note:  This document was prepared using Dragon voice recognition software and may include unintentional dictation errors.   Claudene Rover, MD 05/10/23 1146

## 2023-05-10 NOTE — Progress Notes (Signed)
 Subjective/Chief Complaint: Patient seen.  Sent over to the emergency department today from the wound care center.  Has been followed closely recently by Dr. Ashley.   Objective: Vital signs in last 24 hours: Temp:  [99.1 F (37.3 C)-100.5 F (38.1 C)] 99.1 F (37.3 C) (01/09 1211) Pulse Rate:  [75-105] 94 (01/09 1800) Resp:  [16-26] 23 (01/09 1800) BP: (116-163)/(66-144) 154/80 (01/09 1800) SpO2:  [97 %-100 %] 99 % (01/09 1800) Weight:  [101 kg] 101 kg (01/09 0931)    Intake/Output from previous day: No intake/output data recorded. Intake/Output this shift: No intake/output data recorded.  Left foot is currently bandaged.  Left intact.  Significant full-thickness ulcerative areas noted on the left lateral foot and a transmetatarsal amputation site around the first metatarsal as noted from pictures and discussion with Dr. Ashley.  Lab Results:  Recent Labs    05/10/23 0939  WBC 10.8*  HGB 11.4*  HCT 34.6*  PLT 293   BMET Recent Labs    05/10/23 0939  NA 127*  K 4.0  CL 96*  CO2 18*  GLUCOSE 242*  BUN 15  CREATININE 1.42*  CALCIUM  8.6*   PT/INR Recent Labs    05/10/23 0939  LABPROT 16.8*  INR 1.3*   ABG No results for input(s): PHART, HCO3 in the last 72 hours.  Invalid input(s): PCO2, PO2  Studies/Results: DG Foot Complete Left Result Date: 05/10/2023 CLINICAL DATA:  Wound in the lateral left foot. Concern for osteomyelitis or free air. EXAM: LEFT FOOT - COMPLETE 3+ VIEW COMPARISON:  Radiograph dated 02/26/2023. FINDINGS: Status post transmetatarsal amputation of the foot. There is no acute fracture or dislocation. The bones are osteopenic. There is degenerative changes of the tarsometatarsal joints. No erosive changes or periosteal elevation to suggest acute osteomyelitis. Soft tissue thickening of the stump of the forefoot with irregularity of the skin over the medial stump. No soft tissue gas. Vascular calcifications. IMPRESSION: 1. Status  post transmetatarsal amputation of the foot. No radiographic evidence of acute osteomyelitis. 2. Soft tissue thickening of the stump of the forefoot with irregularity of the skin over the medial stump. No soft tissue gas. Electronically Signed   By: Vanetta Chou M.D.   On: 05/10/2023 11:07   DG Chest Port 1 View Result Date: 05/10/2023 CLINICAL DATA:  [id number] Sepsis [facility]) [id number] EXAM: PORTABLE CHEST 1 VIEW COMPARISON:  CXR 02/26/23 FINDINGS: Assessment is slightly limited due to patient positioning. No pleural effusion. No pneumothorax. Likely unchanged cardiac and mediastinal contours. Status post median sternotomy and CABG. No focal airspace opacity. No radiographically apparent displaced rib fractures. Visualized upper abdomen is unremarkable. IMPRESSION: No focal airspace opacity Electronically Signed   By: Lyndall Gore M.D.   On: 05/10/2023 10:04    Anti-infectives: Anti-infectives (From admission, onward)    Start     Dose/Rate Route Frequency Ordered Stop   05/11/23 1300  vancomycin  (VANCOREADY) IVPB 1750 mg/350 mL        1,750 mg 175 mL/hr over 120 Minutes Intravenous Every 24 hours 05/10/23 1208     05/10/23 2300  ceFEPIme  (MAXIPIME ) 2 g in sodium chloride  0.9 % 100 mL IVPB        2 g 200 mL/hr over 30 Minutes Intravenous Every 12 hours 05/10/23 1208     05/10/23 2000  metroNIDAZOLE  (FLAGYL ) IVPB 500 mg        500 mg 100 mL/hr over 60 Minutes Intravenous Every 12 hours 05/10/23 1145 05/17/23 1959   05/10/23 1100  ceFEPIme  (MAXIPIME ) 2 g in sodium chloride  0.9 % 100 mL IVPB        2 g 200 mL/hr over 30 Minutes Intravenous  Once 05/10/23 1050 05/10/23 1141   05/10/23 1100  vancomycin  (VANCOREADY) IVPB 2000 mg/400 mL        2,000 mg 200 mL/hr over 120 Minutes Intravenous  Once 05/10/23 1050 05/10/23 1528       Assessment/Plan: s/p Procedure(s): Lower Extremity Angiography (Left) Assessment: 1.  Full-thickness ulcerations left foot with concern for underlying osteomyelitis.   2.  Diabetes with neuropathy. 3.  Peripheral vascular disease.  Plan: Discussed with the patient that we are waiting to have his MRI performed to evaluate if there is any bone infection.  Patient also is scheduled for vascular evaluation in the vascular lab tomorrow.  If the patient is noted to have a bone infection in the midfoot area with the size of the ulcerative area there he will be at very high likelihood for the need of higher amputation then in the foot.  I will reevaluate the patient tomorrow and discuss further what options he has at this point.  LOS: 0 days    Krystal LELON Rosella 05/10/2023

## 2023-05-10 NOTE — Progress Notes (Signed)
 [name], [name] ([id number]) [id number] Page 1 of 5 Visit Report for 05/10/2023 Abuse Risk Screen Details Patient Name: Date of Service: Andrew Harding [facility] H. 05/10/2023 8:15 A M Medical Record Number: [id number] Patient Account Number: [id number] Date of Birth/Sex: Treating RN: 08-03-1950 (73 y.o. Andrew Harding Primary Care Danyle Boening: Fernande Cargo Other Clinician: Referring Jaiya Mooradian: Treating Naika Noto/Extender: Bethena Andre Ashley Eva Devra in Treatment: 0 Abuse Risk Screen Items Answer ABUSE RISK SCREEN: Has anyone close to you tried to hurt or harm you recentlyo No Do you feel uncomfortable with anyone in your familyo No Has anyone forced you do things that you didnt want to doo No Electronic Signature(s) Signed: 05/10/2023 4:11:19 PM By: Vaughn Harding Entered By: Vaughn Harding on 05/10/2023 08:24:58 -------------------------------------------------------------------------------- Activities of Daily Living Details Patient Name: Date of Service: Andrew Harding [facility] H. 05/10/2023 8:15 A M Medical Record Number: [id number] Patient Account Number: [id number] Date of Birth/Sex: Treating RN: 1951/02/21 (73 y.o. Andrew Harding Primary Care Jaslyn Bansal: Fernande Cargo Other Clinician: Referring Jousha Schwandt: Treating Reagen Haberman/Extender: Bethena Andre Ashley Eva Devra in Treatment: 0 Activities of Daily Living Items Answer Activities of Daily Living (Please select one for each item) Drive Automobile Completely Able T Medications ake Completely Able Use T elephone Completely Able Care for Appearance Completely Able Use T oilet Completely Able Bath / Shower Completely Able Dress Self Completely Able Feed Self Completely Able Walk Completely Able Get In / Out Bed Completely Able Housework Completely Andrew Harding, Andrew Harding ([id number]) [phone] [id number] Page 2 of 5 Prepare Meals Completely Able Handle Money Completely Able Shop for Self  Completely Able Electronic Signature(s) Signed: 05/10/2023 4:11:19 PM By: Vaughn Harding Entered By: Vaughn Harding on 05/10/2023 08:25:18 -------------------------------------------------------------------------------- Education Screening Details Patient Name: Date of Service: Andrew Harding [facility] H. 05/10/2023 8:15 A M Medical Record Number: [id number] Patient Account Number: [id number] Date of Birth/Sex: Treating RN: [birth year] (73 y.o. Andrew Harding Primary Care Elanor Cale: Fernande Cargo Other Clinician: Referring Haik Mahoney: Treating Jerri Glauser/Extender: Bethena Andre Ashley Eva Devra in Treatment: 0 Primary Learner Assessed: Patient Learning Preferences/Education Level/Primary Language Learning Preference: Explanation, Demonstration, Printed Material Preferred Language: English Cognitive Barrier Language Barrier: No Translator Needed: No Memory Deficit: No Emotional Barrier: No Cultural/Religious Beliefs Affecting Medical Care: No Physical Barrier Impaired Vision: Yes Glasses Impaired Hearing: No Decreased Hand dexterity: No Knowledge/Comprehension Knowledge Level: High Comprehension Level: High Ability to understand written instructions: High Ability to understand verbal instructions: High Motivation Anxiety Level: Calm Cooperation: Cooperative Education Importance: Acknowledges Need Interest in Health Problems: Asks Questions Perception: Coherent Willingness to Engage in Self-Management High Activities: Readiness to Engage in Self-Management High Activities: Electronic Signature(s) Signed: 05/10/2023 4:11:19 PM By: Vaughn Harding Entered By: Vaughn Harding on 05/10/2023 08:25:38 Andrew Harding, Andrew Harding ([id number]) [phone] [id number] Page 3 of 5 -------------------------------------------------------------------------------- Fall Risk Assessment Details Patient Name: Date of Service: Andrew Harding [facility] H. 05/10/2023 8:15 A M Medical Record Number:  [id number] Patient Account Number: [id number] Date of Birth/Sex: Treating RN: 1950/08/27 (73 y.o. Andrew Harding Primary Care Cace Osorto: Fernande Cargo Other Clinician: Referring Jabbar Palmero: Treating Kegan Mckeithan/Extender: Bethena Andre Ashley Eva Devra in Treatment: 0 Fall Risk Assessment Items Have you had 2 or more falls in the last 12 monthso 0 No Have you had any fall that resulted in injury in the last 12 monthso 0 No FALLS RISK SCREEN History of falling - immediate or within 3 months 0 No Secondary diagnosis (Do you have 2 or more medical diagnoseso) 0 No Ambulatory  aid None/bed rest/wheelchair/nurse 0 Yes Crutches/cane/walker 0 No Furniture 0 No Intravenous therapy Access/Saline/Heparin  Lock 0 No Gait/Transferring Normal/ bed rest/ wheelchair 0 Yes Weak (short steps with or without shuffle, stooped but able to lift head while walking, may seek 0 No support from furniture) Impaired (short steps with shuffle, may have difficulty arising from chair, head down, impaired 0 No balance) Mental Status Oriented to own ability 0 Yes Electronic Signature(s) Signed: 05/10/2023 4:11:19 PM By: Vaughn Harding Entered By: Vaughn Harding on 05/10/2023 08:25:50 -------------------------------------------------------------------------------- Foot Assessment Details Patient Name: Date of Service: Andrew Harding [facility] H. 05/10/2023 8:15 A M Medical Record Number: [id number] Patient Account Number: [id number] Date of Birth/Sex: Treating RN: July 15, 1950 (73 y.o. Andrew Harding Primary Care Damoney Julia: Fernande Cargo Other Clinician: Referring Breylon Sherrow: Treating Jeremaine Maraj/Extender: Bethena Andre Ashley Eva Devra in Treatment: 0 Foot Assessment Items Site Locations Andrew Harding, Andrew Harding H ([id number]) [id number] Page 4 of 5 + = Sensation present, - = Sensation absent, C = Callus, U = Ulcer R = Redness, W = Warmth, M = Maceration, PU = Pre-ulcerative lesion F = Fissure, S = Swelling,  D = Dryness Assessment Right: Left: Other Deformity: No No Prior Foot Ulcer: No No Prior Amputation: No No Charcot Joint: No No Ambulatory Status: Ambulatory Without Help Gait: Unsteady Notes Pt was unsteady on her feet. EMS arrived around 0910, report given, updated list of medications provided, PA Stone note provided and patients face sheet provided, EMS stated understanding to report and stated no further questions, pt placed in stretcher and taken to Hospital to be assessed. All belongings sent with patient, wounds covered with ABD and kerlex. Electronic Signature(s) Signed: 05/10/2023 9:19:07 AM By: Vaughn Harding Entered By: Vaughn Harding on 05/10/2023 09:19:07 -------------------------------------------------------------------------------- Nutrition Risk Screening Details Patient Name: Date of Service: Andrew Harding [facility] H. 05/10/2023 8:15 A M Medical Record Number: [id number] Patient Account Number: [id number] Date of Birth/Sex: Treating RN: May 15, 1950 (73 y.o. Andrew Harding Primary Care Quentavious Rittenhouse: Fernande Cargo Other Clinician: Referring Khole Arterburn: Treating Kerby Borner/Extender: Bethena Andre Ashley Eva Weeks in Treatment: 0 Height (in): 75 Weight (lbs): 230 Body Mass Index (BMI): 28.7 Nutrition Risk Screening Items Score Screening NUTRITION RISK SCREEN: I have an illness or condition that made me change the kind and/or amount of food I eat 0 No NAFTALI, CARCHI ([id number]) [phone] [id number] Page 5 of 5 I eat fewer than two meals per day 0 No I eat few fruits and vegetables, or milk products 0 No I have three or more drinks of beer, liquor or wine almost every day 0 No I have tooth or mouth problems that make it hard for me to eat 0 No I don't always have enough money to buy the food I need 0 No I eat alone most of the time 0 No I take three or more different prescribed or over-the-counter drugs a day 0 No Without wanting to, I have lost or gained 10  pounds in the last six months 0 No I am not always physically able to shop, cook and/or feed myself 0 No Nutrition Protocols Good Risk Protocol 0 No interventions needed Moderate Risk Protocol High Risk Proctocol Risk Level: Good Risk Score: 0 Electronic Signature(s) Signed: 05/10/2023 4:11:19 PM By: Vaughn Harding Entered By: Vaughn Harding on 05/10/2023 08:25:59

## 2023-05-10 NOTE — Assessment & Plan Note (Signed)
 Cont statin

## 2023-05-10 NOTE — Assessment & Plan Note (Signed)
 BP stable Titrate home regimen

## 2023-05-10 NOTE — Progress Notes (Signed)
 Pharmacy Antibiotic Note  Andrew Harding is a 73 y.o. male admitted on 05/10/2023 with sepsis. Patient presenting after syncopal episode at outpatient wound care clinic. 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 In ED, patient is afebrile, WBC 10.8, LA 2.3. Pharmacy has been consulted for vancomycin  and cefepime  dosing.  Plan: Start cefepime  2 g IV every 12 hours based on current renal function Give Vancomycin  load of 2000 mg IV x1  Start vancomycin  1750 mg IV every 24 hours (eAUC 477.8, Cmin 11.3, Scr 1.42, IBW used, Vd 0.72 L/kg) Monitor renal function, clinical status, culture data, and LOT F/u MRSA PCR and de-escalate abx as able  Height: 6' 3 (190.5 cm) Weight: 101 kg (222 lb 10.6 oz) IBW/kg (Calculated) : 84.5  Temp (24hrs), Avg:100.5 F (38.1 C), Min:100.5 F (38.1 C), Max:100.5 F (38.1 C)  Recent Labs  Lab 05/10/23 0939  WBC 10.8*  CREATININE 1.42*  LATICACIDVEN 2.3*    Estimated Creatinine Clearance: 55.4 mL/min (A) (by C-G formula based on SCr of 1.42 mg/dL (H)).    No Known Allergies  Antimicrobials this admission: cefepime  1/9 >>  vancomycin  1/9 >>   Dose adjustments this admission: N/A  Microbiology results: 1/9 BCx: pending 1/9 MRSA PCR: pending  Thank you for involving pharmacy in this patient's care.   Damien Napoleon, PharmD Clinical Pharmacist 05/10/2023 12:11 PM

## 2023-05-10 NOTE — Assessment & Plan Note (Addendum)
 Meeting sepsis criteria based on T 100.5, HR 100s, WBC 10.8  Lactate 2.3>>1.1, PCT 0.48 , preliminary blood cultures negative.  MRI of left foot with concern of acute osteomyelitis involving the pretty much whole left over TMA stump. ID, podiatry and vascular surgery is on board Continue IV cefepime , flagyl  and vancomycin  for broad spectrum coverage  -Going for left lower extremity angiography and possible intervention with vascular surgery today

## 2023-05-11 ENCOUNTER — Encounter
Admission: EM | Disposition: A | Discharge status: Skilled Nursing Facility | Payer: Self-pay | Source: Home / Self Care | Attending: Internal Medicine

## 2023-05-11 ENCOUNTER — Inpatient Hospital Stay: Payer: Managed Care, Other (non HMO)

## 2023-05-11 DIAGNOSIS — E1122 Type 2 diabetes mellitus with diabetic chronic kidney disease: Secondary | ICD-10-CM

## 2023-05-11 DIAGNOSIS — M86172 Other acute osteomyelitis, left ankle and foot: Secondary | ICD-10-CM

## 2023-05-11 DIAGNOSIS — S91302A Unspecified open wound, left foot, initial encounter: Secondary | ICD-10-CM | POA: Diagnosis not present

## 2023-05-11 DIAGNOSIS — R7881 Bacteremia: Secondary | ICD-10-CM

## 2023-05-11 DIAGNOSIS — I10 Essential (primary) hypertension: Secondary | ICD-10-CM | POA: Diagnosis not present

## 2023-05-11 DIAGNOSIS — A419 Sepsis, unspecified organism: Secondary | ICD-10-CM | POA: Diagnosis not present

## 2023-05-11 DIAGNOSIS — N1832 Chronic kidney disease, stage 3b: Secondary | ICD-10-CM

## 2023-05-11 DIAGNOSIS — Z794 Long term (current) use of insulin: Secondary | ICD-10-CM

## 2023-05-11 DIAGNOSIS — E782 Mixed hyperlipidemia: Secondary | ICD-10-CM

## 2023-05-11 DIAGNOSIS — I4811 Longstanding persistent atrial fibrillation: Secondary | ICD-10-CM

## 2023-05-11 HISTORY — PX: LOWER EXTREMITY ANGIOGRAPHY: CATH118251

## 2023-05-11 HISTORY — DX: Bacteremia: R78.81

## 2023-05-11 LAB — BLOOD CULTURE ID PANEL (REFLEXED) - BCID2

## 2023-05-11 LAB — CBG MONITORING, ED
Glucose-Capillary: 179 mg/dL — ABNORMAL HIGH (ref 70–99)
Glucose-Capillary: 168 mg/dL — ABNORMAL HIGH (ref 70–99)

## 2023-05-11 LAB — GLUCOSE, CAPILLARY
Glucose-Capillary: 129 mg/dL — ABNORMAL HIGH (ref 70–99)
Glucose-Capillary: 134 mg/dL — ABNORMAL HIGH (ref 70–99)
Glucose-Capillary: 167 mg/dL — ABNORMAL HIGH (ref 70–99)

## 2023-05-11 LAB — COMPREHENSIVE METABOLIC PANEL
ALT: 19 U/L (ref 0–44)
AST: 26 U/L (ref 15–41)
Albumin: 3.1 g/dL — ABNORMAL LOW (ref 3.5–5.0)
Alkaline Phosphatase: 52 U/L (ref 38–126)
Anion gap: 11 (ref 5–15)
BUN: 14 mg/dL (ref 8–23)
CO2: 21 mmol/L — ABNORMAL LOW (ref 22–32)
Calcium: 8.4 mg/dL — ABNORMAL LOW (ref 8.9–10.3)
Chloride: 100 mmol/L (ref 98–111)
Creatinine, Ser: 1.3 mg/dL — ABNORMAL HIGH (ref 0.61–1.24)
GFR, Estimated: 58 mL/min — ABNORMAL LOW (ref >60)
Glucose, Bld: 153 mg/dL — ABNORMAL HIGH (ref 70–99)
Potassium: 3.6 mmol/L (ref 3.5–5.1)
Sodium: 132 mmol/L — ABNORMAL LOW (ref 135–145)
Total Bilirubin: 0.8 mg/dL (ref 0.0–1.2)
Total Protein: 6.9 g/dL (ref 6.5–8.1)

## 2023-05-11 LAB — CBC
HCT: 31.5 % — ABNORMAL LOW (ref 39.0–52.0)
Hemoglobin: 10.7 g/dL — ABNORMAL LOW (ref 13.0–17.0)
MCH: 28.8 pg (ref 26.0–34.0)
MCHC: 34 g/dL (ref 30.0–36.0)
MCV: 84.9 fL (ref 80.0–100.0)
Platelets: 227 10*3/uL (ref 150–400)
RBC: 3.71 MIL/uL — ABNORMAL LOW (ref 4.22–5.81)
RDW: 13.6 % (ref 11.5–15.5)
WBC: 6.4 10*3/uL (ref 4.0–10.5)
nRBC: 0 % (ref 0.0–0.2)

## 2023-05-11 LAB — MRSA NEXT GEN BY PCR, NASAL: MRSA by PCR Next Gen: NOT DETECTED

## 2023-05-11 SURGERY — LOWER EXTREMITY ANGIOGRAPHY
Anesthesia: Moderate Sedation | Laterality: Left

## 2023-05-11 MED ORDER — IODIXANOL 320 MG/ML IV SOLN
INTRAVENOUS | Status: DC | PRN
  Administered 2023-05-11: 50 mL

## 2023-05-11 MED ORDER — MIDAZOLAM HCL 2 MG/2ML IJ SOLN
INTRAMUSCULAR | Status: DC | PRN
  Administered 2023-05-11 (×2): .5 mg via INTRAVENOUS
  Administered 2023-05-11: 2 mg via INTRAVENOUS

## 2023-05-11 MED ORDER — HEPARIN SODIUM (PORCINE) 1000 UNIT/ML IJ SOLN
INTRAMUSCULAR | Status: AC
  Filled 2023-05-11: qty 10

## 2023-05-11 MED ORDER — DIPHENHYDRAMINE HCL 50 MG/ML IJ SOLN: 50.0000 mg | Freq: Once | INTRAMUSCULAR | Status: DC | PRN

## 2023-05-11 MED ORDER — METHYLPREDNISOLONE SODIUM SUCC 125 MG IJ SOLR: 125.0000 mg | Freq: Once | INTRAMUSCULAR | Status: DC | PRN

## 2023-05-11 MED ORDER — LIDOCAINE-EPINEPHRINE (PF) 1 %-1:200000 IJ SOLN
INTRAMUSCULAR | Status: DC | PRN
  Administered 2023-05-11: 10 mL

## 2023-05-11 MED ORDER — FENTANYL CITRATE (PF) 100 MCG/2ML IJ SOLN
INTRAMUSCULAR | Status: AC
  Filled 2023-05-11: qty 2

## 2023-05-11 MED ORDER — MIDAZOLAM HCL 5 MG/5ML IJ SOLN
INTRAMUSCULAR | Status: AC
  Filled 2023-05-11: qty 5

## 2023-05-11 MED ORDER — HEPARIN (PORCINE) IN NACL 1000-0.9 UT/500ML-% IV SOLN
INTRAVENOUS | Status: DC | PRN
  Administered 2023-05-11: 500 mL

## 2023-05-11 MED ORDER — MIDAZOLAM HCL 2 MG/ML PO SYRP: 8.0000 mg | ORAL_SOLUTION | Freq: Once | ORAL | Status: DC | PRN

## 2023-05-11 MED ORDER — CEFAZOLIN SODIUM-DEXTROSE 2-4 GM/100ML-% IV SOLN
2.0000 g | INTRAVENOUS | Status: DC
  Filled 2023-05-11: qty 100

## 2023-05-11 MED ORDER — HEPARIN SODIUM (PORCINE) 1000 UNIT/ML IJ SOLN
INTRAMUSCULAR | Status: DC | PRN
  Administered 2023-05-11: 5000 [IU] via INTRAVENOUS

## 2023-05-11 MED ORDER — FAMOTIDINE 20 MG PO TABS: 40.0000 mg | ORAL_TABLET | Freq: Once | ORAL | Status: DC | PRN

## 2023-05-11 MED ORDER — FENTANYL CITRATE (PF) 100 MCG/2ML IJ SOLN
INTRAMUSCULAR | Status: DC | PRN
  Administered 2023-05-11: 50 ug via INTRAVENOUS
  Administered 2023-05-11 (×2): 25 ug via INTRAVENOUS

## 2023-05-11 MED ORDER — FENTANYL CITRATE PF 50 MCG/ML IJ SOSY: 12.5000 ug | PREFILLED_SYRINGE | Freq: Once | INTRAMUSCULAR | Status: DC | PRN

## 2023-05-11 MED ORDER — SODIUM CHLORIDE 0.9 % IV SOLN: INTRAVENOUS | Status: DC

## 2023-05-11 SURGICAL SUPPLY — 16 items
BALLN ULTRVRSE 3X300X150 OTW (BALLOONS) ×1
BALLOON ULTRVRSE 3X300X150 OTW (BALLOONS) IMPLANT
CATH ANGIO 5F PIGTAIL 65CM (CATHETERS) IMPLANT
CATH CXI SUPP ANG 4FR 135 (CATHETERS) IMPLANT
CATH CXI SUPP ANG 4FR 135CM (CATHETERS) ×1
COVER PROBE ULTRASOUND 5X96 (MISCELLANEOUS) IMPLANT
DEVICE PRESTO INFLATION (MISCELLANEOUS) IMPLANT
DEVICE STARCLOSE SE CLOSURE (Vascular Products) IMPLANT
GLIDEWIRE ADV .035X260CM (WIRE) IMPLANT
GUIDEWIRE PFTE-COATED .018X300 (WIRE) IMPLANT
PACK ANGIOGRAPHY (CUSTOM PROCEDURE TRAY) ×1 IMPLANT
SHEATH BRITE TIP 5FRX11 (SHEATH) IMPLANT
SHEATH RAABE 6FRX70 (SHEATH) IMPLANT
SYR MEDRAD MARK 7 150ML (SYRINGE) IMPLANT
TUBING CONTRAST HIGH PRESS 72 (TUBING) IMPLANT
WIRE GUIDERIGHT .035X150 (WIRE) IMPLANT

## 2023-05-11 NOTE — Progress Notes (Signed)
 Date of Admission:  05/10/2023   T   ID: Andrew Harding is a 73 y.o. male  Active Problems:   Type 2 diabetes mellitus with stage 3b chronic kidney disease, with long-term current use of insulin  (HCC)   Benign essential hypertension   CKD (chronic kidney disease) stage 3, GFR 30-59 ml/min (HCC)   Coronary artery disease   Mixed hyperlipidemia   Atrial fibrillation (HCC)   Diabetic foot infection with possible osteomyelitis, left  (HCC)   Sepsis (HCC)   Open wound of left foot with complication    Subjective: Pt is still in ED Says he is okay Had MRI  Medications:   insulin  aspart  0-15 Units Subcutaneous TID WC   insulin  aspart  0-5 Units Subcutaneous QHS   insulin  glargine-yfgn  20 Units Subcutaneous Daily    Objective: Vital signs in last 24 hours: Patient Vitals for the past 24 hrs:  BP Temp Temp src Pulse Resp SpO2  05/11/23 0835 -- 98.9 F (37.2 C) Oral -- -- --  05/11/23 0835 99/68 -- -- 83 19 95 %  05/11/23 0500 (!) 123/54 -- -- 86 (!) 22 98 %  05/11/23 0459 -- 99.7 F (37.6 C) Oral -- 14 --  05/11/23 0430 (!) 129/59 -- -- 92 -- 97 %  05/11/23 0400 (!) 153/72 -- -- 81 20 100 %  05/11/23 0230 (!) 146/81 -- -- 86 18 99 %  05/11/23 0200 131/71 -- -- 86 (!) 21 98 %  05/11/23 0130 118/62 -- -- 90 18 99 %  05/11/23 0100 125/81 -- -- 78 -- 97 %  05/11/23 0045 -- -- -- 86 19 100 %  05/10/23 2100 134/81 -- -- 100 -- 97 %  05/10/23 2030 (!) 147/71 -- -- 87 (!) 26 96 %  05/10/23 2000 (!) 149/75 -- -- 80 -- 96 %  05/10/23 1930 129/71 -- -- (!) 106 -- 98 %  05/10/23 1800 (!) 154/80 -- -- 94 (!) 23 99 %  05/10/23 1730 (!) 152/69 -- -- 94 20 100 %  05/10/23 1700 (!) 163/84 -- -- (!) 103 (!) 26 100 %  05/10/23 1630 (!) 161/144 -- -- 98 (!) 24 100 %  05/10/23 1600 (!) 152/75 -- -- 75 18 100 %  05/10/23 1530 134/77 -- -- 81 18 98 %  05/10/23 1430 127/81 -- -- 79 17 100 %  05/10/23 1211 -- 99.1 F (37.3 C) Oral -- -- --  05/10/23 1130 116/67 -- -- 82 (!) 22 99 %   05/10/23 1100 124/66 -- -- 93 (!) 23 98 %      PHYSICAL EXAM:  General: Alert, cooperative, no distress, appears stated age.  Lungs: Clear to auscultation bilaterally. No Wheezing or Rhonchi. No rales. Heart: irregular Abdomen: Soft, non-tender,not distended. Bowel sounds normal. No masses Extremities:       Skin: No rashes or lesions. Or bruising Lymph: Cervical, supraclavicular normal. Neurologic: Grossly non-focal  Lab Results    Latest Ref Rng & Units 05/11/2023    5:01 AM 05/10/2023    9:39 AM 04/05/2023   11:26 AM  CBC  WBC 4.0 - 10.5 K/uL 6.4  10.8  8.8   Hemoglobin 13.0 - 17.0 g/dL 89.2  88.5  87.6   Hematocrit 39.0 - 52.0 % 31.5  34.6  37.7   Platelets 150 - 400 K/uL 227  293  232        Latest Ref Rng & Units 05/11/2023    5:01 AM  05/10/2023    9:39 AM 04/05/2023   11:26 AM  CMP  Glucose 70 - 99 mg/dL 846  757  873   BUN 8 - 23 mg/dL 14  15  20    Creatinine 0.61 - 1.24 mg/dL 8.69  8.57  8.59   Sodium 135 - 145 mmol/L 132  127  138   Potassium 3.5 - 5.1 mmol/L 3.6  4.0  4.8   Chloride 98 - 111 mmol/L 100  96  103   CO2 22 - 32 mmol/L 21  18  25    Calcium  8.9 - 10.3 mg/dL 8.4  8.6  9.0   Total Protein 6.5 - 8.1 g/dL 6.9  7.6  8.0   Total Bilirubin 0.0 - 1.2 mg/dL 0.8  0.6  0.8   Alkaline Phos 38 - 126 U/L 52  58  63   AST 15 - 41 U/L 26  28  21    ALT 0 - 44 U/L 19  20  16        Microbiology:  Studies/Results: MR FOOT LEFT WO CONTRAST Result Date: 05/11/2023 CLINICAL DATA:  History of type 2 diabetes status post transmetatarsal amputation of left foot. Sepsis. Suspected left lower extremity cellulitis versus osteomyelitis. Worsening pain within left foot for multiple weeks. EXAM: MRI OF THE LEFT FOOT WITHOUT CONTRAST TECHNIQUE: Multiplanar, multisequence MR imaging of the left foot was performed. No intravenous contrast was administered. COMPARISON:  left foot radiographs 05/10/2023 and 02/26/2023; MRI left forefoot 08/18/2022 FINDINGS: Bones/Joint/Cartilage  Postsurgical changes are again seen of amputation of the first through fourth metatarsal to the level of the mid metatarsal shafts and the first metatarsal to the level of the proximal metaphysis. There is high-grade marrow edema throughout the remaining first metatarsal. There is moderate to high-grade marrow edema within the proximal greater than distal aspect of the postsurgical stump of the second metatarsal and moderate marrow edema within the proximal shaft of the postsurgical third metatarsal stump. Given the surrounding forefoot soft tissue edema, findings are suspicious for osteomyelitis of the remaining shaft of the second and third metatarsals. There is high-grade marrow edema throughout the remaining base of the fifth metatarsal. There is moderate cortical erosion at the proximal plantar lateral aspect of the base of the fifth metatarsal, indicating acute osteomyelitis of the remaining fifth metatarsal. There is mild patchy marrow edema within the cuboid without definite cortical erosion, nonspecific and possibly stress related/reactive. Ligaments The Lisfranc ligament complex is intact. Muscles and Tendons There is diffuse edema throughout the intrinsic plantar greater than dorsal foot musculature. Soft tissues There is moderate subcutaneous fat soft tissue swelling and edema. There is a concave defect within the soft tissues of the distal medial aspect of the., as seen on yesterday's radiographs (series 5, image 29 and series 7, image 9). This is adjacent to the distal dorsomedial aspect of the first metatarsal amputation stump. There is moderate edema and skin thickening in this region, and there appears to be a decreased T1 increased T2 signal sinus tract extending from the skin surface to the distal aspect of the first metatarsal stump (series 7, image 12). There is a soft tissue ulcer measuring up to 1.6 cm along the longitudinal dimension of the foot (series 5, image 35) and 1.8 cm along the short  axis of the foot (coronal series 7, image 31) and contacting the lateral base of the fifth metatarsal. IMPRESSION: 1. Postsurgical changes of amputation of the first through fourth metatarsals to the level of the mid  metatarsal shafts and the first metatarsal to the level of the proximal metaphysis. 2. There is a concavity within the distal medial forefoot amputation soft tissues with apparent sinus tract extending to the distal aspect of the remaining first metatarsal. Cortical erosion and high-grade marrow edema within the remaining first metatarsal indicating acute osteomyelitis. 3. Soft tissue ulcer at the plantar lateral midfoot adjacent to the base of the fifth metatarsal with cortical erosion and high-grade marrow edema throughout the remaining base of the fifth metatarsal, indicating acute osteomyelitis of the remaining postsurgical fifth metatarsal. 4. There is moderate marrow edema within the proximal greater than distal aspect of the postsurgical stump of the second metatarsal and moderate marrow edema within the proximal shaft of the postsurgical third metatarsal stump. Given the surrounding forefoot soft tissue edema, findings are suspicious for osteomyelitis of the remaining shaft of the second and third metatarsals. However, no definitive sinus tract is seen contacting the second or third metatarsal stumps. Electronically Signed   By: Tanda Lyons M.D.   On: 05/11/2023 10:34   DG Foot Complete Left Result Date: 05/10/2023 CLINICAL DATA:  Wound in the lateral left foot. Concern for osteomyelitis or free air. EXAM: LEFT FOOT - COMPLETE 3+ VIEW COMPARISON:  Radiograph dated 02/26/2023. FINDINGS: Status post transmetatarsal amputation of the foot. There is no acute fracture or dislocation. The bones are osteopenic. There is degenerative changes of the tarsometatarsal joints. No erosive changes or periosteal elevation to suggest acute osteomyelitis. Soft tissue thickening of the stump of the forefoot  with irregularity of the skin over the medial stump. No soft tissue gas. Vascular calcifications. IMPRESSION: 1. Status post transmetatarsal amputation of the foot. No radiographic evidence of acute osteomyelitis. 2. Soft tissue thickening of the stump of the forefoot with irregularity of the skin over the medial stump. No soft tissue gas. Electronically Signed   By: Vanetta Chou M.D.   On: 05/10/2023 11:07   DG Chest Port 1 View Result Date: 05/10/2023 CLINICAL DATA:  [id number] Sepsis [facility]) [id number] EXAM: PORTABLE CHEST 1 VIEW COMPARISON:  CXR 02/26/23 FINDINGS: Assessment is slightly limited due to patient positioning. No pleural effusion. No pneumothorax. Likely unchanged cardiac and mediastinal contours. Status post median sternotomy and CABG. No focal airspace opacity. No radiographically apparent displaced rib fractures. Visualized upper abdomen is unremarkable. IMPRESSION: No focal airspace opacity Electronically Signed   By: Lyndall Gore M.D.   On: 05/10/2023 10:04     Assessment/Plan: Gram neg bacteremia- ID still not known  Diabetic foot infection Left foot with ulcer on the lateral margin TMA of the left foot Has neuropathy and PAD Recent culture was enterobacter, klebsiella and MSSA and he started ciprofloxacin  yesterday,Awaiting MRI Pt on his feet at work every day and does not off load pressure off that foot   on vanco , cefepime  and flagyl - can DC vanco MRI of the foot shows osteo of 5th metatarsal  It is a difficult situation where just antibiotics alone wont suffice-   CKD-   CAD s/p CABG   Afib on eliquis  and metoprolol  Discussed the management with the patient ID will not routinely see him this weekend On call ID available by phone for urgent issues

## 2023-05-11 NOTE — ED Notes (Signed)
 Pt provided blankets per request pt appears to be attempting to rest quietly pt uses urinal @ bs

## 2023-05-11 NOTE — ED Notes (Signed)
 Presenter, broadcasting confirms with CCMD pt on monitor per order

## 2023-05-11 NOTE — ED Notes (Signed)
 Laboratory called to get a phlebotomist to draw blood cultures after my attempts were unsuccessful.

## 2023-05-11 NOTE — ED Notes (Signed)
 Admitting physician @ pt bs to evaluate pt left foot wound physician removes dressing currently in place and re-dresses left foot wound dressing supplies provided

## 2023-05-11 NOTE — Progress Notes (Signed)
 PHARMACY - PHYSICIAN COMMUNICATION CRITICAL VALUE ALERT - BLOOD CULTURE IDENTIFICATION (BCID)  Results for orders placed or performed during the hospital encounter of 05/10/23  Culture, blood (Routine x 2)     Status: None (Preliminary result)   Collection Time: 05/10/23  9:38 AM   Specimen: BLOOD  Result Value Ref Range Status   Specimen Description BLOOD RIGHT ANTECUBITAL  Final   Special Requests   Final    BOTTLES DRAWN AEROBIC AND ANAEROBIC Blood Culture results may not be optimal due to an inadequate volume of blood received in culture bottles   Culture  Setup Time   Final    Organism ID to follow GRAM NEGATIVE RODS IN BOTH AEROBIC AND ANAEROBIC BOTTLES CRITICAL RESULT CALLED TO, READ BACK BY AND VERIFIED WITH: Aspynn Clover BELEUE @0252  ON 05/11/23 SKL Performed at [facility], [address] [place], [place] [address]    Culture PENDING  Incomplete   Report Status PENDING  Incomplete  Blood Culture ID Panel (Reflexed)     Status: Abnormal   Collection Time: 05/10/23  9:38 AM  Result Value Ref Range Status   Enterococcus faecalis NOT DETECTED NOT DETECTED Final   Enterococcus Faecium NOT DETECTED NOT DETECTED Final   Listeria monocytogenes NOT DETECTED NOT DETECTED Final   Staphylococcus species NOT DETECTED NOT DETECTED Final   Staphylococcus aureus (BCID) NOT DETECTED NOT DETECTED Final   Staphylococcus epidermidis NOT DETECTED NOT DETECTED Final   Staphylococcus lugdunensis NOT DETECTED NOT DETECTED Final   Streptococcus species NOT DETECTED NOT DETECTED Final   Streptococcus agalactiae NOT DETECTED NOT DETECTED Final   Streptococcus pneumoniae NOT DETECTED NOT DETECTED Final   Streptococcus pyogenes NOT DETECTED NOT DETECTED Final   A.calcoaceticus-baumannii NOT DETECTED NOT DETECTED Final   Bacteroides fragilis NOT DETECTED NOT DETECTED Final   Enterobacterales DETECTED (A) NOT DETECTED Final    Comment: Enterobacterales represent a large order of gram negative  bacteria, not a single organism. Refer to culture for further identification. CRITICAL RESULT CALLED TO, READ BACK BY AND VERIFIED WITH: Anaiyah Anglemyer BELEUE @0252  ON 05/11/23 SKL    Enterobacter cloacae complex NOT DETECTED NOT DETECTED Final   Escherichia coli NOT DETECTED NOT DETECTED Final   Klebsiella aerogenes NOT DETECTED NOT DETECTED Final   Klebsiella oxytoca NOT DETECTED NOT DETECTED Final   Klebsiella pneumoniae NOT DETECTED NOT DETECTED Final   Proteus species NOT DETECTED NOT DETECTED Final   Salmonella species NOT DETECTED NOT DETECTED Final   Serratia marcescens NOT DETECTED NOT DETECTED Final   Haemophilus influenzae NOT DETECTED NOT DETECTED Final   Neisseria meningitidis NOT DETECTED NOT DETECTED Final   Pseudomonas aeruginosa NOT DETECTED NOT DETECTED Final   Stenotrophomonas maltophilia NOT DETECTED NOT DETECTED Final   Candida albicans NOT DETECTED NOT DETECTED Final   Candida auris NOT DETECTED NOT DETECTED Final   Candida glabrata NOT DETECTED NOT DETECTED Final   Candida krusei NOT DETECTED NOT DETECTED Final   Candida parapsilosis NOT DETECTED NOT DETECTED Final   Candida tropicalis NOT DETECTED NOT DETECTED Final   Cryptococcus neoformans/gattii NOT DETECTED NOT DETECTED Final   CTX-M ESBL NOT DETECTED NOT DETECTED Final   Carbapenem resistance IMP NOT DETECTED NOT DETECTED Final   Carbapenem resistance KPC NOT DETECTED NOT DETECTED Final   Carbapenem resistance NDM NOT DETECTED NOT DETECTED Final   Carbapenem resist OXA 48 LIKE NOT DETECTED NOT DETECTED Final   Carbapenem resistance VIM NOT DETECTED NOT DETECTED Final    Comment: Performed at [facility],  [address], [place], [place] [address]  Resp panel by RT-PCR (RSV, Flu A&B, Covid) Anterior Nasal Swab     Status: None   Collection Time: 05/10/23 10:21 AM   Specimen: Anterior Nasal Swab  Result Value Ref Range Status   SARS Coronavirus 2 by RT PCR NEGATIVE NEGATIVE Final    Comment:  (NOTE) SARS-CoV-2 target nucleic acids are NOT DETECTED.  The SARS-CoV-2 RNA is generally detectable in upper respiratory specimens during the acute phase of infection. The lowest concentration of SARS-CoV-2 viral copies this assay can detect is 138 copies/mL. A negative result does not preclude SARS-Cov-2 infection and should not be used as the sole basis for treatment or other patient management decisions. A negative result may occur with  improper specimen collection/handling, submission of specimen other than nasopharyngeal swab, presence of viral mutation(s) within the areas targeted by this assay, and inadequate number of viral copies(<138 copies/mL). A negative result must be combined with clinical observations, patient history, and epidemiological information. The expected result is Negative.  Fact Sheet for Patients:  bloggercourse.com  Fact Sheet for Healthcare Providers:  seriousbroker.it  This test is no t yet approved or cleared by the [place]  FDA and  has been authorized for detection and/or diagnosis of SARS-CoV-2 by FDA under an Emergency Use Authorization (EUA). This EUA will remain  in effect (meaning this test can be used) for the duration of the COVID-19 declaration under Section 564(b)(1) of the Act, 21 U.S.C.section 360bbb-3(b)(1), unless the authorization is terminated  or revoked sooner.       Influenza A by PCR NEGATIVE NEGATIVE Final   Influenza B by PCR NEGATIVE NEGATIVE Final    Comment: (NOTE) The Xpert Xpress SARS-CoV-2/FLU/RSV plus assay is intended as an aid in the diagnosis of influenza from Nasopharyngeal swab specimens and should not be used as a sole basis for treatment. Nasal washings and aspirates are unacceptable for Xpert Xpress SARS-CoV-2/FLU/RSV testing.  Fact Sheet for Patients: bloggercourse.com  Fact Sheet for Healthcare  Providers: seriousbroker.it  This test is not yet approved or cleared by the [place]  FDA and has been authorized for detection and/or diagnosis of SARS-CoV-2 by FDA under an Emergency Use Authorization (EUA). This EUA will remain in effect (meaning this test can be used) for the duration of the COVID-19 declaration under Section 564(b)(1) of the Act, 21 U.S.C. section 360bbb-3(b)(1), unless the authorization is terminated or revoked.     Resp Syncytial Virus by PCR NEGATIVE NEGATIVE Final    Comment: (NOTE) Fact Sheet for Patients: bloggercourse.com  Fact Sheet for Healthcare Providers: seriousbroker.it  This test is not yet approved or cleared by the [place]  FDA and has been authorized for detection and/or diagnosis of SARS-CoV-2 by FDA under an Emergency Use Authorization (EUA). This EUA will remain in effect (meaning this test can be used) for the duration of the COVID-19 declaration under Section 564(b)(1) of the Act, 21 U.S.C. section 360bbb-3(b)(1), unless the authorization is terminated or revoked.  Performed at [facility], [address] [place], [place] [address]     BCID Results: 2 (same set) of 4 bottles with Enterobacterales, no resistance detected.  Pt currently on Cefepime , Flagyl , and Vancomycin .  Dr. Fayette following so no changes recommended at this time.  Name of provider contacted: WENDI Cone, NP   Changes to prescribed antibiotics required: None  Rankin CANDIE Dills, PharmD, [facility] 05/11/2023 4:15 AM

## 2023-05-11 NOTE — Progress Notes (Signed)
 PT Cancellation Note  Patient Details Name: Andrew Harding MRN: [id number] DOB: 04-29-1951   Cancelled Treatment:    Reason Eval/Treat Not Completed: Patient at procedure or test/unavailable, will attempt to see pt at a future date/time as medically appropriate.    CHARM Hamilton Gasper Hopes PT, DPT 05/11/23, 1:28 PM

## 2023-05-11 NOTE — Progress Notes (Signed)
 Progress Note   [name] FMW:[id number] DOB: February 24, 1951 DOA: 05/10/2023     1 DOS: the patient was seen and examined on 05/11/2023   Brief hospital course: Taken from H&P and prior notes.  ELENA COTHERN is a 73 y.o. male with medical history significant of insulin -dependent DM 2, A-fib on Eliquis , HTN, CAD status post CABG, CKD 3B, PAD s/p transmetatarsal amputation left foot presenting w/ sepsis, suspected LLE cellulitis vs. Osteomyelitis. Pt reports worsening pain over L foot for multiple weeks.  Noted to have been admitted 03/2023 for issues including MSSA bacteremia, diabetic foot infection.Has had outpt follow up w/ ID and podiatry since discharge.   Noted recent evaluation w/ ID  cultures showing enterobacter, klebsiella and MSSA in the recent culture taken from the lateral wound- transitioned from cefadroxil  to cipro . No reported medical noncompliance. Had follow up with podiatry 05/03/2023 w/ plan for MRI and vascular referral.   On presentation patient was febrile at 100.5, heart rate 100s, WBC 10.8, sodium 127, creatinine 1.42, glucose 242, lactic acid 2.3>>1.1.  DG left foot with soft tissue swelling.  Chest x-ray normal.  Procalcitonin 0.48. Vascular surgery and podiatry was consulted. Started on broad-spectrum antibiotics.  1/10: Vital stable, sodium improved to 132, creatinine at 1.30, leukocytosis resolved, preliminary blood cultures negative.  MRI of left foot with concern of acute osteomyelitis involving the remaining first metatarsal, base of fifth metatarsal, also involving the remaining proximal shafts of second and third metatarsal stem. Vascular is planning to do left lower extremity angiography with possible intervention today.      Assessment and Plan: Sepsis (HCC) Meeting sepsis criteria based on T 100.5, HR 100s, WBC 10.8  Lactate 2.3>>1.1, PCT 0.48 , preliminary blood cultures negative.  MRI of left foot with concern of acute osteomyelitis involving the  pretty much whole left over TMA stump. ID, podiatry and vascular surgery is on board Continue IV cefepime , flagyl  and vancomycin  for broad spectrum coverage  -Going for left lower extremity angiography and possible intervention with vascular surgery today   Diabetic foot infection with possible osteomyelitis, left  (HCC) Also recently seen by ID via Dr. Fayette 05/08/2022 with cultures showing enterobacter, klebsiella and MSSA in the recent culture taken from the lateral wound.  Transitioned to cipo po  L foot plain films grossly stable but MRI foot concerning for acute osteomyelitis involving the left over metatarsal from TMA -Continue with broad-spectrum antibiotics -High risk for further amputation   Atrial fibrillation (HCC) Currently rate controlled Cont eliquis  and metoprolol   Monitor    Benign essential hypertension BP stable  Titrate home regimen    CKD (chronic kidney disease) stage 3, GFR 30-59 ml/min (HCC) Creatinine with some improvement to 1.3, baseline appears to be around 1.2.  Did not met criteria for AKI. -Monitor renal function -Avoid nephrotoxins   Coronary artery disease Baseline CAD s/p CABG  No active CP  Cont home regimen    Mixed hyperlipidemia Cont statin    Type 2 diabetes mellitus with stage 3b chronic kidney disease, with long-term current use of insulin  (HCC) SSI  Long acting insulin   Monitor    Subjective: Patient was seen and examined today.  Denies any pain.  He was asking when he can go home.  Physical Exam: Vitals:   05/11/23 0835 05/11/23 1145 05/11/23 1155 05/11/23 1303  BP:  111/63  126/73  Pulse:  85  79  Resp:  15  (!) 80  Temp: 98.9 F (37.2 C)  98.3  F (36.8 C) 98.2 F (36.8 C)  TempSrc: Oral  Oral Oral  SpO2:  95%  96%  Weight:      Height:       General.  Well-developed elderly man, in no acute distress. Pulmonary.  Lungs clear bilaterally, normal respiratory effort. CV.  Regular rate and rhythm, no JVD,  rub or murmur. Abdomen.  Soft, nontender, nondistended, BS positive. CNS.  Alert and oriented .  No focal neurologic deficit. Extremities.  Left TMA and a clean bandage on left foot Psychiatry.  Judgment and insight appears normal.   Data Reviewed: Prior data reviewed  Family Communication: Called wife with no response  Disposition: Status is: Inpatient Remains inpatient appropriate because: Severity of illness  Planned Discharge Destination: Home  Time spent: 50 minutes  This record has been created using Conservation officer, historic buildings. Errors have been sought and corrected,but may not always be located. Such creation errors do not reflect on the standard of care.   Author: Amaryllis Dare, MD 05/11/2023 1:29 PM  For on call review [email or web address].

## 2023-05-11 NOTE — Interval H&P Note (Signed)
 History and Physical Interval Note:  05/11/2023 1:16 PM  Andrew Harding Feeling  has presented today for surgery, with the diagnosis of PAD.  The various methods of treatment have been discussed with the patient and family. After consideration of risks, benefits and other options for treatment, the patient has consented to  Procedure(s): Lower Extremity Angiography (Left) as a surgical intervention.  The patient's history has been reviewed, patient examined, no change in status, stable for surgery.  I have reviewed the patient's chart and labs.  Questions were answered to the patient's satisfaction.     Currie Dennin

## 2023-05-11 NOTE — ED Notes (Signed)
 Continues to remove cardiac monitor uses urinal and requests blankets , blankets provided

## 2023-05-11 NOTE — Hospital Course (Addendum)
 Taken from H&P and prior notes.  Andrew Harding is a 73 y.o. male with medical history significant of insulin -dependent DM 2, A-fib on Eliquis , HTN, CAD status post CABG, CKD 3B, PAD s/p transmetatarsal amputation left foot presenting w/ sepsis, suspected LLE cellulitis vs. Osteomyelitis. Pt reports worsening pain over L foot for multiple weeks.  Noted to have been admitted 03/2023 for issues including MSSA bacteremia, diabetic foot infection.Has had outpt follow up w/ ID and podiatry since discharge.   Noted recent evaluation w/ ID  cultures showing enterobacter, klebsiella and MSSA in the recent culture taken from the lateral wound- transitioned from cefadroxil  to cipro . No reported medical noncompliance. Had follow up with podiatry 05/03/2023 w/ plan for MRI and vascular referral.   On presentation patient was febrile at 100.5, heart rate 100s, WBC 10.8, sodium 127, creatinine 1.42, glucose 242, lactic acid 2.3>>1.1.  DG left foot with soft tissue swelling.  Chest x-ray normal.  Procalcitonin 0.48. Vascular surgery and podiatry was consulted. Started on broad-spectrum antibiotics.  1/10: Vital stable, sodium improved to 132, creatinine at 1.30, leukocytosis resolved, preliminary blood cultures negative.  MRI of left foot with concern of acute osteomyelitis involving the remaining first metatarsal, base of fifth metatarsal, also involving the remaining proximal shafts of second and third metatarsal stem. Vascular is planning to do left lower extremity angiography with possible intervention today.  1/11: S/p left lower extremity angiography and PCI, podiatry is recommending BKA.  1/4 blood culture with Enterobacter with no specific identification yet.  Continue current antibiotics.  1/12: Hemodynamically stable, blood cultures from 1/9 growing Morganella Morgan I-pending susceptibility.  Repeat cultures from 1/10 remain negative  1/13: Hemodynamically stable, Morganella Morgagni susceptibility shows  resistance to ampicillin  and Augmentin .  Vascular surgery is planning BKA on Wednesday.  1/14: Remained stable, going for BKA with vascular surgery tomorrow.

## 2023-05-11 NOTE — ED Notes (Signed)
 Attempted blood cultures twice on pt and was unsuccessful

## 2023-05-11 NOTE — ED Notes (Signed)
 Patient transported to MRI

## 2023-05-11 NOTE — ED Notes (Signed)
 Patient given urinal and call bell at bedside within reach

## 2023-05-11 NOTE — Progress Notes (Signed)
 OT Cancellation Note  Patient Details Name: Andrew Harding MRN: [id number] DOB: 05-12-1950   Cancelled Treatment:    Reason Eval/Treat Not Completed: Other (comment) (pt OTF for procedure/further testing. OT will reattempt as able.ALLIE Therisa Sheffield, OTD OTR/L  05/11/23, 2:04 PM

## 2023-05-11 NOTE — Plan of Care (Signed)
  Problem: Coping: Goal: Ability to adjust to condition or change in health will improve Outcome: Progressing  Problem: Skin Integrity: Goal: Risk for impaired skin integrity will decrease Outcome: Progressing   Problem: Pain Management: Goal: General experience of comfort will improve Outcome: Progressing

## 2023-05-11 NOTE — ED Notes (Signed)
 Pt continues to use urinal and when this event occurs pt cardiac cables become askew pt request new gown and new blankets new gown new blankets provided pt request apple juice and apple sauce food provided pt has approx yellow urine output pt left foot remains bandaged pt 20g piv to lfa remains secured patent cdi not infiltrated @ this time CCMD contacts Presenter, [employer] to notify pt off monitor Presenter, Broadcasting notifies CCMD is @ pt bs currently replacing all cardiac patches

## 2023-05-12 ENCOUNTER — Other Ambulatory Visit: Payer: Self-pay

## 2023-05-12 DIAGNOSIS — I10 Essential (primary) hypertension: Secondary | ICD-10-CM | POA: Diagnosis not present

## 2023-05-12 DIAGNOSIS — A419 Sepsis, unspecified organism: Secondary | ICD-10-CM | POA: Diagnosis not present

## 2023-05-12 DIAGNOSIS — I4811 Longstanding persistent atrial fibrillation: Secondary | ICD-10-CM | POA: Diagnosis not present

## 2023-05-12 DIAGNOSIS — M86172 Other acute osteomyelitis, left ankle and foot: Secondary | ICD-10-CM | POA: Diagnosis not present

## 2023-05-12 LAB — BASIC METABOLIC PANEL
Anion gap: 11 (ref 5–15)
BUN: 15 mg/dL (ref 8–23)
CO2: 19 mmol/L — ABNORMAL LOW (ref 22–32)
Calcium: 8.5 mg/dL — ABNORMAL LOW (ref 8.9–10.3)
Chloride: 105 mmol/L (ref 98–111)
Creatinine, Ser: 1.21 mg/dL (ref 0.61–1.24)
GFR, Estimated: >60 mL/min (ref >60)
Glucose, Bld: 148 mg/dL — ABNORMAL HIGH (ref 70–99)
Potassium: 3.6 mmol/L (ref 3.5–5.1)
Sodium: 135 mmol/L (ref 135–145)

## 2023-05-12 LAB — GLUCOSE, CAPILLARY
Glucose-Capillary: 168 mg/dL — ABNORMAL HIGH (ref 70–99)
Glucose-Capillary: 241 mg/dL — ABNORMAL HIGH (ref 70–99)
Glucose-Capillary: 195 mg/dL — ABNORMAL HIGH (ref 70–99)
Glucose-Capillary: 119 mg/dL — ABNORMAL HIGH (ref 70–99)

## 2023-05-12 LAB — CBC
HCT: 31.1 % — ABNORMAL LOW (ref 39.0–52.0)
Hemoglobin: 10.6 g/dL — ABNORMAL LOW (ref 13.0–17.0)
MCH: 28.5 pg (ref 26.0–34.0)
MCHC: 34.1 g/dL (ref 30.0–36.0)
MCV: 83.6 fL (ref 80.0–100.0)
Platelets: 228 10*3/uL (ref 150–400)
RBC: 3.72 MIL/uL — ABNORMAL LOW (ref 4.22–5.81)
RDW: 13.7 % (ref 11.5–15.5)
WBC: 4.6 10*3/uL (ref 4.0–10.5)
nRBC: 0 % (ref 0.0–0.2)

## 2023-05-12 MED ORDER — SODIUM CHLORIDE 0.9 % IV SOLN
2.0000 g | Freq: Three times a day (TID) | INTRAVENOUS | Status: AC
  Administered 2023-05-12 – 2023-05-20 (×26): 2 g via INTRAVENOUS
  Filled 2023-05-12 (×26): qty 12.5

## 2023-05-12 NOTE — Progress Notes (Signed)
 OT Cancellation Note  Patient Details Name: Andrew Harding MRN: [id number] DOB: 1951/02/09   Cancelled Treatment:    Reason Eval/Treat Not Completed: OT screened, no needs identified, will sign off. OT orders received, chart reviewed. Pt reports being at baseline level of function for ADLs, IADLs, and functional mobility. Denies any concerns. Will complete orders. Please re-consult if there are any acute changes.   Cindi Ghazarian 05/12/2023, 1:24 PM

## 2023-05-12 NOTE — Progress Notes (Signed)
      Daily Progress Note   Assessment/Planning:   POD #LRo, PTA ATA for L foot gangrene  Improved flow into foot noted Further wound care per Podiatry Follow up with Dr. Marea in 2 weeks Call office for appt   Subjective  - 1 Day Post-Op   Not complaints   Objective   Vitals:   05/11/23 2134 05/12/23 0430 05/12/23 0754 05/12/23 0830  BP: 132/85 116/67 124/76 135/79  Pulse: 91 70 79 92  Resp: 20 20 20 18   Temp: 98.8 F (37.1 C) 98.3 F (36.8 C) 98.2 F (36.8 C) 98.2 F (36.8 C)  TempSrc: Oral Oral Oral   SpO2: 98% 96% 100% 100%  Weight:      Height:         Intake/Output Summary (Last 24 hours) at 05/12/2023 1002 Last data filed at 05/12/2023 0427 Gross per 24 hour  Intake 955.79 ml  Output 800 ml  Net 155.79 ml    VASC R groin: no hematoma, no PSA, no thrill; L foot bandaged    Laboratory   CBC    Latest Ref Rng & Units 05/12/2023    4:49 AM 05/11/2023    5:01 AM 05/10/2023    9:39 AM  CBC  WBC 4.0 - 10.5 K/uL 4.6  6.4  10.8   Hemoglobin 13.0 - 17.0 g/dL 89.3  89.2  88.5   Hematocrit 39.0 - 52.0 % 31.1  31.5  34.6   Platelets 150 - 400 K/uL 228  227  293     BMET    Component Value Date/Time   NA 135 05/12/2023 0449   K 3.6 05/12/2023 0449   CL 105 05/12/2023 0449   CO2 19 (L) 05/12/2023 0449   GLUCOSE 148 (H) 05/12/2023 0449   GLUCOSE 402 (H) 09/17/2012 1417   BUN 15 05/12/2023 0449   CREATININE 1.21 05/12/2023 0449   CALCIUM  8.5 (L) 05/12/2023 0449   GFRNONAA >60 05/12/2023 0449   GFRAA 58 (L) 06/02/2015 0656     Redell Door, MD, FACS, FSVS Covering for [facility]: [phone]  05/12/2023, 10:02 AM

## 2023-05-12 NOTE — Progress Notes (Signed)
 Progress Note   [name] FMW:[id number] DOB: 09/16/1950 DOA: 05/10/2023     2 DOS: the patient was seen and examined on 05/12/2023   Brief hospital course: Taken from H&P and prior notes.  Andrew Harding is a 73 y.o. male with medical history significant of insulin -dependent DM 2, A-fib on Eliquis , HTN, CAD status post CABG, CKD 3B, PAD s/p transmetatarsal amputation left foot presenting w/ sepsis, suspected LLE cellulitis vs. Osteomyelitis. Pt reports worsening pain over L foot for multiple weeks.  Noted to have been admitted 03/2023 for issues including MSSA bacteremia, diabetic foot infection.Has had outpt follow up w/ ID and podiatry since discharge.   Noted recent evaluation w/ ID  cultures showing enterobacter, klebsiella and MSSA in the recent culture taken from the lateral wound- transitioned from cefadroxil  to cipro . No reported medical noncompliance. Had follow up with podiatry 05/03/2023 w/ plan for MRI and vascular referral.   On presentation patient was febrile at 100.5, heart rate 100s, WBC 10.8, sodium 127, creatinine 1.42, glucose 242, lactic acid 2.3>>1.1.  DG left foot with soft tissue swelling.  Chest x-ray normal.  Procalcitonin 0.48. Vascular surgery and podiatry was consulted. Started on broad-spectrum antibiotics.  1/10: Vital stable, sodium improved to 132, creatinine at 1.30, leukocytosis resolved, preliminary blood cultures negative.  MRI of left foot with concern of acute osteomyelitis involving the remaining first metatarsal, base of fifth metatarsal, also involving the remaining proximal shafts of second and third metatarsal stem. Vascular is planning to do left lower extremity angiography with possible intervention today.  1/11: S/p left lower extremity angiography and PCI, podiatry is recommending BKA.  1/4 blood culture with Enterobacter with no specific identification yet.  Continue current antibiotics    Assessment and Plan: Sepsis [facility]) Meeting sepsis  criteria based on T 100.5, HR 100s, WBC 10.8  Lactate 2.3>>1.1, PCT 0.48 , preliminary blood cultures, 1/4 bottles with Enterobacter with no specific ID.  MRI of left foot with concern of acute osteomyelitis involving the pretty much whole left over TMA stump.  S/p angiography and PCI ID, podiatry and vascular surgery is on board Continue IV cefepime , flagyl  and vancomycin  for broad spectrum coverage  -Likely will need BKA   Diabetic foot infection with possible osteomyelitis, left  (HCC) Also recently seen by ID via Dr. Fayette 05/08/2022 with cultures showing enterobacter, klebsiella and MSSA in the recent culture taken from the lateral wound.  Transitioned to cipo po  L foot plain films grossly stable but MRI foot concerning for acute osteomyelitis involving the left over metatarsal from TMA -Continue with broad-spectrum antibiotics -High risk for further amputation   Atrial fibrillation (HCC) Currently rate controlled Cont eliquis  and metoprolol   Monitor    Benign essential hypertension BP stable  Titrate home regimen    CKD (chronic kidney disease) stage 3, GFR 30-59 ml/min (HCC) Creatinine with some improvement to 1.3, baseline appears to be around 1.2.  Did not met criteria for AKI. -Monitor renal function -Avoid nephrotoxins   Coronary artery disease Baseline CAD s/p CABG  No active CP  Cont home regimen    Mixed hyperlipidemia Cont statin    Type 2 diabetes mellitus with stage 3b chronic kidney disease, with long-term current use of insulin  (HCC) SSI  Long acting insulin   Monitor    Subjective: Patient was seen and examined today.  Denies any pain.  Physical Exam: Vitals:   05/11/23 2134 05/12/23 0430 05/12/23 0754 05/12/23 0830  BP: 132/85 116/67 124/76 135/79  Pulse: 91 70 79 92  Resp: 20 20 20 18   Temp: 98.8 F (37.1 C) 98.3 F (36.8 C) 98.2 F (36.8 C) 98.2 F (36.8 C)  TempSrc: Oral Oral Oral   SpO2: 98% 96% 100% 100%  Weight:       Height:       General.  Well-developed elderly man, in no acute distress. Pulmonary.  Lungs clear bilaterally, normal respiratory effort. CV.  Regular rate and rhythm, no JVD, rub or murmur. Abdomen.  Soft, nontender, nondistended, BS positive. CNS.  Alert and oriented .  No focal neurologic deficit. Extremities.  No edema, left foot with Ace wrap Psychiatry.  Judgment and insight appears normal.   Data Reviewed: Prior data reviewed  Family Communication:   Disposition: Status is: Inpatient Remains inpatient appropriate because: Severity of illness  Planned Discharge Destination: Home  Time spent: 50 minutes  This record has been created using Conservation officer, historic buildings. Errors have been sought and corrected,but may not always be located. Such creation errors do not reflect on the standard of care.   Author: Amaryllis Dare, MD 05/12/2023 2:54 PM  For on call review [email or web address].

## 2023-05-12 NOTE — Assessment & Plan Note (Addendum)
 Also recently seen by ID via Dr. Fayette 05/08/2022 with cultures showing enterobacter, klebsiella and MSSA in the recent culture taken from the lateral wound.  Transitioned to cipo po  L foot plain films grossly stable but MRI foot concerning for acute osteomyelitis involving the left over metatarsal from TMA -Continue with broad-spectrum antibiotics -BKA on Wednesday

## 2023-05-12 NOTE — Assessment & Plan Note (Addendum)
 Meeting sepsis criteria based on T 100.5, HR 100s, WBC 10.8  Lactate 2.3>>1.1, PCT 0.48 , preliminary blood cultures, 1/4 bottles with Enterobacter with no specific ID.  MRI of left foot with concern of acute osteomyelitis involving the pretty much whole left over TMA stump.  S/p angiography and PCI ID, podiatry and vascular surgery is on board Continue IV cefepime , flagyl   -Vascular surgery is planning BKA on Wednesday

## 2023-05-12 NOTE — Evaluation (Signed)
 Physical Therapy Evaluation Patient Details Name: Andrew Harding MRN: [id number] DOB: [birth year] Today's Date: 05/12/2023  History of Present Illness  Andrew Harding is a 73 y.o. male with medical history significant of insulin -dependent DM 2, A-fib on Eliquis , HTN, CAD status post CABG, CKD 3B, PAD s/p transmetatarsal amputation left foot presenting w/ sepsis, suspected LLE cellulitis vs. Osteomyelitis. Pt reports worsening pain over L foot for multiple weeks.  Noted to have been admitted 03/2023 for issues including MSSA bacteremia, diabetic foot infection.Has had outpt follow up w/ ID and podiatry since discharge.  Admitted for sepsis. MRI of left foot with concern of acute osteomyelitis involving the remaining first metatarsal, base of fifth metatarsal, also involving the remaining proximal shafts of second and third metatarsal stem. Also recently seen by ID via Dr. Fayette 05/08/2022 with cultures showing enterobacter, klebsiella and MSSA in the recent culture taken from the lateral wound.   Clinical Impression  Patient received alert and oriented, reclining in bed and agreeable to PT evaluation. He lives alone in a 1 story home with 4 steps to enter with a wall to lean on but no handrail. Prior to hospitalization, he was I with all aspects of care and mobility and reports not falls in the last 6 months. 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 Upon PT evaluation, he was independent with bed mobility, transfers, and LE dressing. He received supervision to [facility] for ambulation and completion of 6 stairs without UE and was mildly unsteady on his feet without any LOB. Patient's HR up to 185 bpm momentarily more consistently in the 170s during weight bearing mobility training, prompting RN to check on patient and request he rest. HR returned to [facility] with seated rest after mobility. SpO2 WNL throughout session. Patient appears to have experienced a decrease in functional independence and  activity tolerance and would benefit from continued PT until heart rate concerns have resolved. Patient would benefit from skilled physical therapy to address impairments and functional limitations (see PT Problem List below) to work towards stated goals and return to PLOF or maximal functional independence.       If plan is discharge home, recommend the following: Other (comment) (patient is mod I currently)   Can travel by private vehicle    yes    Equipment Recommendations None recommended by PT  Recommendations for Other Services       Functional Status Assessment Patient has had a recent decline in their functional status and demonstrates the ability to make significant improvements in function in a reasonable and predictable amount of time.     Precautions / Restrictions Precautions Precautions: Fall Required Braces or Orthoses: Other Brace Other Brace: has an ortho shoe for left foot, but no current order to use it Restrictions Weight Bearing Restrictions Per Provider Order: No      Mobility  Bed Mobility Overal bed mobility: Independent                  Transfers Overall transfer level: Independent Equipment used: None                    Ambulation/Gait Ambulation/Gait assistance: Contact guard assist, Supervision Gait Distance (Feet): 290 Feet Assistive device: None Gait Pattern/deviations: Decreased stride length Gait velocity: decreased     General Gait Details: Patient ambulated around nursing station and up and down hallway to stairs with CGA-SBA with mild unsteadiness but no LOB. HR up to 185 bpm  and somewhat labile. Decreased quickly to low 100s with discontinuation of ambulation/standing.  Stairs Stairs: Yes Stairs assistance: Contact guard assist, Supervision Stair Management: No rails, Step to pattern, Forwards Number of Stairs: 6 General stair comments: Patient navigated 6 steps with CGA-SBA and no UE support using step to  pattern.  Wheelchair Mobility     Tilt Bed    Modified Rankin (Stroke Patients Only)       Balance Overall balance assessment: Needs assistance Sitting-balance support: Bilateral upper extremity supported Sitting balance-Leahy Scale: Normal Sitting balance - Comments: able to don shoes independently while sitting at edge of bed   Standing balance support: During functional activity, No upper extremity supported Standing balance-Leahy Scale: Good Standing balance comment: increased sway but no LOB with standing mobility without AD or hand held support                             Pertinent Vitals/Pain Pain Assessment Pain Assessment: No/denies pain    Home Living Family/patient expects to be discharged to:: Private residence Living Arrangements: Alone   Type of Home: House Home Access: Stairs to enter Entrance Stairs-Rails: None Entrance Stairs-Number of Steps: 4 steps with wall on the left but no handrail   Home Layout: One level Home Equipment: Agricultural Consultant (2 wheels);Cane - single point      Prior Function Prior Level of Function : Working/employed;Driving             Mobility Comments: Reports being I with all aspects of care and mobility. Works full time as a government social research officer at the [employer] (requires walking, standing, sitting). Denies falls in the last 6 months ADLs Comments: independent     Extremity/Trunk Assessment   Upper Extremity Assessment Upper Extremity Assessment: Overall WFL for tasks assessed    Lower Extremity Assessment Lower Extremity Assessment: Overall WFL for tasks assessed (lacks left forefoot, and right great toe, so has reduced ankle strategy/stability with walking)    Cervical / Trunk Assessment Cervical / Trunk Assessment: Normal  Communication   Communication Communication: No apparent difficulties Cueing Techniques: Verbal cues;Gestural cues  Cognition Arousal: Alert Behavior During Therapy: WFL for tasks  assessed/performed Overall Cognitive Status: Within Functional Limits for tasks assessed                                          General Comments General comments (skin integrity, edema, etc.): HR up to 185 bpm momentarily more consistently in the 170s during weight bearing mobility training. It returned to [facility] with seated rest after mobility.    Exercises Other Exercises Other Exercises: educated patient on role of PT in acute care setting, discharge reccomendations, benefits of trying to weight bear through left heel during ambulation.   Assessment/Plan    PT Assessment Patient needs continued PT services  PT Problem List Decreased mobility;Decreased knowledge of precautions;Decreased activity tolerance;Cardiopulmonary status limiting activity;Decreased balance;Decreased knowledge of use of DME;Impaired sensation;Decreased skin integrity       PT Treatment Interventions DME instruction;Therapeutic exercise;Gait training;Balance training;Stair training;Neuromuscular re-education;Functional mobility training;Therapeutic activities;Patient/family education    PT Goals (Current goals can be found in the Care Plan section)  Acute Rehab PT Goals Patient Stated Goal: go home and get course of action for this thing PT Goal Formulation: With patient Time For Goal Achievement: 05/26/23 Potential to Achieve Goals: Good  Frequency Min 1X/week     Co-evaluation               AM-PAC PT 6 Clicks Mobility  Outcome Measure Help needed turning from your back to your side while in a flat bed without using bedrails?: None Help needed moving from lying on your back to sitting on the side of a flat bed without using bedrails?: None Help needed moving to and from a bed to a chair (including a wheelchair)?: None Help needed standing up from a chair using your arms (e.g., wheelchair or bedside chair)?: None Help needed to walk in hospital room?: A Little Help needed  climbing 3-5 steps with a railing? : A Little 6 Click Score: 22    End of Session Equipment Utilized During Treatment: Gait belt Activity Tolerance: Patient tolerated treatment well;Treatment limited secondary to medical complications (Comment) (HR up to 185 bpm, prompting nursing to check on patient and request he rest) Patient left: in chair;with call bell/phone within reach;with chair alarm set Nurse Communication: Mobility status PT Visit Diagnosis: Unsteadiness on feet (R26.81);Difficulty in walking, not elsewhere classified (R26.2)    Time: 9161-9086 PT Time Calculation (min) (ACUTE ONLY): 28 min (interrupted by MD for 7 minutes)   Charges:   PT Evaluation $PT Eval Low Complexity: 1 Low PT Treatments $Gait Training: 8-22 mins PT General Charges $$ ACUTE PT VISIT: 1 Visit         Camie SAUNDERS. Juli, PT, DPT 05/12/23, 9:37 AM

## 2023-05-12 NOTE — Progress Notes (Signed)
 PHARMACY - PHYSICIAN COMMUNICATION CRITICAL VALUE ALERT - BLOOD CULTURE IDENTIFICATION (BCID)  Results for orders placed or performed during the hospital encounter of 05/10/23  Culture, blood (Routine x 2)     Status: None (Preliminary result)   Collection Time: 05/10/23  9:38 AM   Specimen: BLOOD  Result Value Ref Range Status   Specimen Description   Final    BLOOD RIGHT ANTECUBITAL Performed at [facility], [address] [place], [place] [address]    Special Requests   Final    BOTTLES DRAWN AEROBIC AND ANAEROBIC Blood Culture results may not be optimal due to an inadequate volume of blood received in culture bottles Performed at [facility], [address] [place], [place] [address]    Culture  Setup Time   Final    GRAM NEGATIVE RODS IN BOTH AEROBIC AND ANAEROBIC BOTTLES CRITICAL RESULT CALLED TO, READ BACK BY AND VERIFIED WITH: Daisey Caloca BELEUE @0252  ON 05/11/23 SKL Performed at [facility], [address] [place], [place] [address]    Culture GRAM NEGATIVE RODS  Final   Report Status PENDING  Incomplete  Blood Culture ID Panel (Reflexed)     Status: Abnormal   Collection Time: 05/10/23  9:38 AM  Result Value Ref Range Status   Enterococcus faecalis NOT DETECTED NOT DETECTED Final   Enterococcus Faecium NOT DETECTED NOT DETECTED Final   Listeria monocytogenes NOT DETECTED NOT DETECTED Final   Staphylococcus species NOT DETECTED NOT DETECTED Final   Staphylococcus aureus (BCID) NOT DETECTED NOT DETECTED Final   Staphylococcus epidermidis NOT DETECTED NOT DETECTED Final   Staphylococcus lugdunensis NOT DETECTED NOT DETECTED Final   Streptococcus species NOT DETECTED NOT DETECTED Final   Streptococcus agalactiae NOT DETECTED NOT DETECTED Final   Streptococcus pneumoniae NOT DETECTED NOT DETECTED Final   Streptococcus pyogenes NOT DETECTED NOT DETECTED Final   A.calcoaceticus-baumannii NOT DETECTED NOT DETECTED Final   Bacteroides fragilis NOT  DETECTED NOT DETECTED Final   Enterobacterales DETECTED (A) NOT DETECTED Final    Comment: Enterobacterales represent a large order of gram negative bacteria, not a single organism. Refer to culture for further identification. CRITICAL RESULT CALLED TO, READ BACK BY AND VERIFIED WITH: Tomika Eckles BELEUE @0252  ON 05/11/23 SKL    Enterobacter cloacae complex NOT DETECTED NOT DETECTED Final   Escherichia coli NOT DETECTED NOT DETECTED Final   Klebsiella aerogenes NOT DETECTED NOT DETECTED Final   Klebsiella oxytoca NOT DETECTED NOT DETECTED Final   Klebsiella pneumoniae NOT DETECTED NOT DETECTED Final   Proteus species NOT DETECTED NOT DETECTED Final   Salmonella species NOT DETECTED NOT DETECTED Final   Serratia marcescens NOT DETECTED NOT DETECTED Final   Haemophilus influenzae NOT DETECTED NOT DETECTED Final   Neisseria meningitidis NOT DETECTED NOT DETECTED Final   Pseudomonas aeruginosa NOT DETECTED NOT DETECTED Final   Stenotrophomonas maltophilia NOT DETECTED NOT DETECTED Final   Candida albicans NOT DETECTED NOT DETECTED Final   Candida auris NOT DETECTED NOT DETECTED Final   Candida glabrata NOT DETECTED NOT DETECTED Final   Candida krusei NOT DETECTED NOT DETECTED Final   Candida parapsilosis NOT DETECTED NOT DETECTED Final   Candida tropicalis NOT DETECTED NOT DETECTED Final   Cryptococcus neoformans/gattii NOT DETECTED NOT DETECTED Final   CTX-M ESBL NOT DETECTED NOT DETECTED Final   Carbapenem resistance IMP NOT DETECTED NOT DETECTED Final   Carbapenem resistance KPC NOT DETECTED NOT DETECTED Final   Carbapenem resistance NDM NOT DETECTED NOT DETECTED Final  Carbapenem resist OXA 48 LIKE NOT DETECTED NOT DETECTED Final   Carbapenem resistance VIM NOT DETECTED NOT DETECTED Final    Comment: Performed at [facility], [address] [place], [place] [address]  Culture, blood (Routine x 2)     Status: None (Preliminary result)   Collection Time: 05/10/23  9:39 AM    Specimen: BLOOD  Result Value Ref Range Status   Specimen Description BLOOD LEFT ANTECUBITAL  Final   Special Requests   Final    BOTTLES DRAWN AEROBIC AND ANAEROBIC Blood Culture results may not be optimal due to an inadequate volume of blood received in culture bottles   Culture  Setup Time   Final    GRAM NEGATIVE RODS ANAEROBIC BOTTLE ONLY Organism ID to follow CRITICAL RESULT CALLED TO, READ BACK BY AND VERIFIED WITHBETHA RANKIN DILLS @ [phone] 05/12/23 [facility] Performed at [facility], [address], [place], [place] [address]    Culture GRAM NEGATIVE RODS  Final   Report Status PENDING  Incomplete  Resp panel by RT-PCR (RSV, Flu A&B, Covid) Anterior Nasal Swab     Status: None   Collection Time: 05/10/23 10:21 AM   Specimen: Anterior Nasal Swab  Result Value Ref Range Status   SARS Coronavirus 2 by RT PCR NEGATIVE NEGATIVE Final    Comment: (NOTE) SARS-CoV-2 target nucleic acids are NOT DETECTED.  The SARS-CoV-2 RNA is generally detectable in upper respiratory specimens during the acute phase of infection. The lowest concentration of SARS-CoV-2 viral copies this assay can detect is 138 copies/mL. A negative result does not preclude SARS-Cov-2 infection and should not be used as the sole basis for treatment or other patient management decisions. A negative result may occur with  improper specimen collection/handling, submission of specimen other than nasopharyngeal swab, presence of viral mutation(s) within the areas targeted by this assay, and inadequate number of viral copies(<138 copies/mL). A negative result must be combined with clinical observations, patient history, and epidemiological information. The expected result is Negative.  Fact Sheet for Patients:  bloggercourse.com  Fact Sheet for Healthcare Providers:  seriousbroker.it  This test is no t yet approved or cleared by the [place]  FDA and  has been  authorized for detection and/or diagnosis of SARS-CoV-2 by FDA under an Emergency Use Authorization (EUA). This EUA will remain  in effect (meaning this test can be used) for the duration of the COVID-19 declaration under Section 564(b)(1) of the Act, 21 U.S.C.section 360bbb-3(b)(1), unless the authorization is terminated  or revoked sooner.       Influenza A by PCR NEGATIVE NEGATIVE Final   Influenza B by PCR NEGATIVE NEGATIVE Final    Comment: (NOTE) The Xpert Xpress SARS-CoV-2/FLU/RSV plus assay is intended as an aid in the diagnosis of influenza from Nasopharyngeal swab specimens and should not be used as a sole basis for treatment. Nasal washings and aspirates are unacceptable for Xpert Xpress SARS-CoV-2/FLU/RSV testing.  Fact Sheet for Patients: bloggercourse.com  Fact Sheet for Healthcare Providers: seriousbroker.it  This test is not yet approved or cleared by the [place]  FDA and has been authorized for detection and/or diagnosis of SARS-CoV-2 by FDA under an Emergency Use Authorization (EUA). This EUA will remain in effect (meaning this test can be used) for the duration of the COVID-19 declaration under Section 564(b)(1) of the Act, 21 U.S.C. section 360bbb-3(b)(1), unless the authorization is terminated or revoked.     Resp Syncytial Virus by PCR NEGATIVE NEGATIVE Final    Comment: (  NOTE) Fact Sheet for Patients: bloggercourse.com  Fact Sheet for Healthcare Providers: seriousbroker.it  This test is not yet approved or cleared by the [place]  FDA and has been authorized for detection and/or diagnosis of SARS-CoV-2 by FDA under an Emergency Use Authorization (EUA). This EUA will remain in effect (meaning this test can be used) for the duration of the COVID-19 declaration under Section 564(b)(1) of the Act, 21 U.S.C. section 360bbb-3(b)(1), unless the  authorization is terminated or revoked.  Performed at [facility], [address] [place], [place] [address]   MRSA Next Gen by PCR, Nasal     Status: None   Collection Time: 05/11/23  5:01 AM   Specimen: Nasal Mucosa; Nasal Swab  Result Value Ref Range Status   MRSA by PCR Next Gen NOT DETECTED NOT DETECTED Final    Comment: (NOTE) The GeneXpert MRSA Assay (FDA approved for NASAL specimens only), is one component of a comprehensive MRSA colonization surveillance program. It is not intended to diagnose MRSA infection nor to guide or monitor treatment for MRSA infections. Test performance is not FDA approved in patients less than 16 years old. Performed at [facility], [address] [place], [place] [address]   Culture, blood (Routine X 2) w Reflex to ID Panel     Status: None (Preliminary result)   Collection Time: 05/11/23 10:16 AM   Specimen: BLOOD  Result Value Ref Range Status   Specimen Description BLOOD BLOOD LEFT HAND  Final   Special Requests   Final    BOTTLES DRAWN AEROBIC AND ANAEROBIC Blood Culture results may not be optimal due to an inadequate volume of blood received in culture bottles   Culture   Final    NO GROWTH < 24 HOURS Performed at [facility], [address] [place], [place] [address]    Report Status PENDING  Incomplete  Culture, blood (Routine X 2) w Reflex to ID Panel     Status: None (Preliminary result)   Collection Time: 05/11/23 10:16 AM   Specimen: BLOOD  Result Value Ref Range Status   Specimen Description BLOOD BLOOD RIGHT HAND  Final   Special Requests   Final    BOTTLES DRAWN AEROBIC AND ANAEROBIC Blood Culture results may not be optimal due to an inadequate volume of blood received in culture bottles   Culture   Final    NO GROWTH < 24 HOURS Performed at [facility], [address], [place], [place] [address]    Report Status PENDING  Incomplete    BCID Results: 1 (anaerobic) of 4  bottles with Enterobacterales (no resistance detected), but no ID.  Pt currently on Cefepime  and Flagyl  per Dr. Fayette.  Name of provider contacted: WENDI Cone, NP   Changes to prescribed antibiotics required: No changes at this time.  Rankin CANDIE Dills, PharmD, [facility] 05/12/2023 4:56 AM

## 2023-05-12 NOTE — Progress Notes (Signed)
 1 Day Post-Op   Subjective/Chief Complaint: Patient seen.  Overall feeling fairly well.   Objective: Vital signs in last 24 hours: Temp:  [98.2 F (36.8 C)-98.8 F (37.1 C)] 98.2 F (36.8 C) (01/11 0830) Pulse Rate:  [70-95] 92 (01/11 0830) Resp:  [12-80] 18 (01/11 0830) BP: (85-135)/(48-95) 135/79 (01/11 0830) SpO2:  [94 %-100 %] 100 % (01/11 0830)    Intake/Output from previous day: 01/10 0701 - 01/11 0700 In: 955.8 [P.O.:240; I.V.:195; IV Piggyback:520.8] Out: 1000 [Urine:1000] Intake/Output this shift: No intake/output data recorded.  Continued large necrotic ulceration over the lateral midfoot with full-thickness ulcerative areas at the first metatarsal and plantar foot on the left.  MRI returned with osteomyelitis of the remaining portions of the first and fifth metatarsals and possibly the second.  On review of the images I also feel that there could be some early inflammation with possible infection starting along the distal peroneal tendon sheath.  Lab Results:  Recent Labs    05/11/23 0501 05/12/23 0449  WBC 6.4 4.6  HGB 10.7* 10.6*  HCT 31.5* 31.1*  PLT 227 228   BMET Recent Labs    05/11/23 0501 05/12/23 0449  NA 132* 135  K 3.6 3.6  CL 100 105  CO2 21* 19*  GLUCOSE 153* 148*  BUN 14 15  CREATININE 1.30* 1.21  CALCIUM  8.4* 8.5*   PT/INR Recent Labs    05/10/23 0939  LABPROT 16.8*  INR 1.3*   ABG No results for input(s): PHART, HCO3 in the last 72 hours.  Invalid input(s): PCO2, PO2  Studies/Results: PERIPHERAL VASCULAR CATHETERIZATION Result Date: 05/11/2023 See surgical note for result.  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 Sepsis. Suspected left lower extremity cellulitis versus osteomyelitis. Worsening pain within left foot for multiple weeks. EXAM: MRI OF THE LEFT FOOT WITHOUT CONTRAST TECHNIQUE: Multiplanar,  multisequence MR imaging of the left foot was performed. No intravenous contrast was administered. COMPARISON:  left foot radiographs 05/10/2023 and 02/26/2023; MRI left forefoot 08/18/2022 FINDINGS: Bones/Joint/Cartilage Postsurgical changes are again seen of amputation of the first through fourth metatarsal to the level of the mid metatarsal shafts and the first metatarsal to the level of the proximal metaphysis. There is high-grade marrow edema throughout the remaining first metatarsal. There is moderate to high-grade marrow edema within the proximal greater than distal aspect of the postsurgical stump of the second metatarsal and moderate marrow edema within the proximal shaft of the postsurgical third metatarsal stump. Given the surrounding forefoot soft tissue edema, findings are suspicious for osteomyelitis of the remaining shaft of the second and third metatarsals. There is high-grade marrow edema throughout the remaining base of the fifth metatarsal. There is moderate cortical erosion at the proximal plantar lateral aspect of the base of the fifth metatarsal, indicating acute osteomyelitis of the remaining fifth metatarsal. There is mild patchy marrow edema within the cuboid without definite cortical erosion, nonspecific and possibly stress related/reactive. Ligaments The Lisfranc ligament complex is intact. Muscles and Tendons There is diffuse edema throughout the intrinsic plantar greater than dorsal foot musculature. Soft tissues There is moderate subcutaneous fat soft tissue swelling and edema. There is a concave defect within the soft tissues of the distal medial aspect of the., as seen on yesterday's radiographs (series 5, image 29 and series 7, image 9). This is adjacent to the distal dorsomedial aspect of the first metatarsal amputation stump. There is moderate  edema and skin thickening in this region, and there appears to be a decreased T1 increased T2 signal sinus tract extending from the skin  surface to the distal aspect of the first metatarsal stump (series 7, image 12). There is a soft tissue ulcer measuring up to 1.6 cm along the longitudinal dimension of the foot (series 5, image 35) and 1.8 cm along the short axis of the foot (coronal series 7, image 31) and contacting the lateral base of the fifth metatarsal. IMPRESSION: 1. Postsurgical changes of amputation of the first through fourth metatarsals to the level of the mid metatarsal shafts and the first metatarsal to the level of the proximal metaphysis. 2. There is a concavity within the distal medial forefoot amputation soft tissues with apparent sinus tract extending to the distal aspect of the remaining first metatarsal. Cortical erosion and high-grade marrow edema within the remaining first metatarsal indicating acute osteomyelitis. 3. Soft tissue ulcer at the plantar lateral midfoot adjacent to the base of the fifth metatarsal with cortical erosion and high-grade marrow edema throughout the remaining base of the fifth metatarsal, indicating acute osteomyelitis of the remaining postsurgical fifth metatarsal. 4. There is moderate marrow edema within the proximal greater than distal aspect of the postsurgical stump of the second metatarsal and moderate marrow edema within the proximal shaft of the postsurgical third metatarsal stump. Given the surrounding forefoot soft tissue edema, findings are suspicious for osteomyelitis of the remaining shaft of the second and third metatarsals. However, no definitive sinus tract is seen contacting the second or third metatarsal stumps. Electronically Signed   By: Tanda Lyons M.D.   On: 05/11/2023 10:34   DG Foot Complete Left Result Date: 05/10/2023 CLINICAL DATA:  Wound in the lateral left foot. Concern for osteomyelitis or free air. EXAM: LEFT FOOT - COMPLETE 3+ VIEW COMPARISON:  Radiograph dated 02/26/2023. FINDINGS: Status post transmetatarsal amputation of the foot. There is no acute fracture or  dislocation. The bones are osteopenic. There is degenerative changes of the tarsometatarsal joints. No erosive changes or periosteal elevation to suggest acute osteomyelitis. Soft tissue thickening of the stump of the forefoot with irregularity of the skin over the medial stump. No soft tissue gas. Vascular calcifications. IMPRESSION: 1. Status post transmetatarsal amputation of the foot. No radiographic evidence of acute osteomyelitis. 2. Soft tissue thickening of the stump of the forefoot with irregularity of the skin over the medial stump. No soft tissue gas. Electronically Signed   By: Vanetta Chou M.D.   On: 05/10/2023 11:07    Anti-infectives: Anti-infectives (From admission, onward)    Start     Dose/Rate Route Frequency Ordered Stop   05/11/23 1300  vancomycin  (VANCOREADY) IVPB 1750 mg/350 mL  Status:  Discontinued        1,750 mg 175 mL/hr over 120 Minutes Intravenous Every 24 hours 05/10/23 1208 05/11/23 1537   05/11/23 1231  ceFAZolin  (ANCEF ) IVPB 2g/100 mL premix  Status:  Discontinued        2 g 200 mL/hr over 30 Minutes Intravenous 30 min pre-op 05/11/23 1231 05/11/23 1423   05/10/23 2300  ceFEPIme  (MAXIPIME ) 2 g in sodium chloride  0.9 % 100 mL IVPB        2 g 200 mL/hr over 30 Minutes Intravenous Every 12 hours 05/10/23 1208     05/10/23 2000  metroNIDAZOLE  (FLAGYL ) IVPB 500 mg        500 mg 100 mL/hr over 60 Minutes Intravenous Every 12 hours 05/10/23 1145 05/17/23 1959  05/10/23 1100  ceFEPIme  (MAXIPIME ) 2 g in sodium chloride  0.9 % 100 mL IVPB        2 g 200 mL/hr over 30 Minutes Intravenous  Once 05/10/23 1050 05/10/23 1141   05/10/23 1100  vancomycin  (VANCOREADY) IVPB 2000 mg/400 mL        2,000 mg 200 mL/hr over 120 Minutes Intravenous  Once 05/10/23 1050 05/10/23 1528       Assessment/Plan: s/p Procedure(s): Lower Extremity Angiography (Left) Assessment: 1.  Full-thickness ulcerations with osteomyelitis left foot 2.  Diabetes with associated neuropathy. 3.   Peripheral vascular disease.  Plan: Betadine  dressings applied to the ulcerations on the left foot.  Discussed with the patient at great length that at this point I believe his foot is nonsalvageable and that his best outcome would be to go ahead and consider below-knee amputation.  Discussed that I do not think hyperbaric oxygen treatment at this point would be a viable option and that the longer we wait on definitive amputation the higher chance he has of other complications, especially if the infection does start to track along the tendon sheath.  Questions were invited and answered.  Patient will consider his options over the next day or 2.  For now podiatry will pull back and follow more peripherally.  LOS: 2 days    Andrew Harding 05/12/2023

## 2023-05-12 NOTE — Plan of Care (Signed)
  Problem: Clinical Measurements: Goal: Diagnostic test results will improve Outcome: Progressing Goal: Signs and symptoms of infection will decrease Outcome: Progressing   Problem: Respiratory: Goal: Ability to maintain adequate ventilation will improve Outcome: Progressing   Problem: Education: Goal: Ability to describe self-care measures that may prevent or decrease complications (Diabetes Survival Skills Education) will improve Outcome: Progressing Goal: Individualized Educational Video(s) Outcome: Progressing

## 2023-05-13 DIAGNOSIS — R7881 Bacteremia: Secondary | ICD-10-CM | POA: Diagnosis not present

## 2023-05-13 DIAGNOSIS — M86172 Other acute osteomyelitis, left ankle and foot: Secondary | ICD-10-CM | POA: Diagnosis not present

## 2023-05-13 DIAGNOSIS — S91302A Unspecified open wound, left foot, initial encounter: Secondary | ICD-10-CM | POA: Diagnosis not present

## 2023-05-13 LAB — CULTURE, BLOOD (ROUTINE X 2)

## 2023-05-13 LAB — GLUCOSE, CAPILLARY
Glucose-Capillary: 141 mg/dL — ABNORMAL HIGH (ref 70–99)
Glucose-Capillary: 188 mg/dL — ABNORMAL HIGH (ref 70–99)
Glucose-Capillary: 131 mg/dL — ABNORMAL HIGH (ref 70–99)
Glucose-Capillary: 180 mg/dL — ABNORMAL HIGH (ref 70–99)

## 2023-05-13 MED ORDER — ENOXAPARIN SODIUM 100 MG/ML IJ SOSY
100.0000 mg | PREFILLED_SYRINGE | Freq: Two times a day (BID) | INTRAMUSCULAR | Status: DC
  Administered 2023-05-13 – 2023-05-15 (×4): 100 mg via SUBCUTANEOUS
  Filled 2023-05-13 (×5): qty 1

## 2023-05-13 NOTE — Plan of Care (Signed)
  Problem: Nutritional: Goal: Maintenance of adequate nutrition will improve Outcome: Progressing   Problem: Education: Goal: Knowledge of General Education information will improve Description: Including pain rating scale, medication(s)/side effects and non-pharmacologic comfort measures Outcome: Progressing   Problem: Activity: Goal: Risk for activity intolerance will decrease Outcome: Progressing   Problem: Pain Management: Goal: General experience of comfort will improve Outcome: Progressing

## 2023-05-13 NOTE — Plan of Care (Signed)
  Problem: Respiratory: Goal: Ability to maintain adequate ventilation will improve Outcome: Progressing   Problem: Education: Goal: Ability to describe self-care measures that may prevent or decrease complications (Diabetes Survival Skills Education) will improve Outcome: Progressing Goal: Individualized Educational Video(s) Outcome: Progressing   Problem: Nutritional: Goal: Maintenance of adequate nutrition will improve Outcome: Progressing Goal: Progress toward achieving an optimal weight will improve Outcome: Progressing   Problem: Activity: Goal: Risk for activity intolerance will decrease Outcome: Progressing   Problem: Safety: Goal: Ability to remain free from injury will improve Outcome: Progressing

## 2023-05-13 NOTE — Progress Notes (Signed)
 Progress Note   [name] FMW:[id number] DOB: 1951/04/06 DOA: 05/10/2023     3 DOS: the patient was seen and examined on 05/13/2023   Brief hospital course: Taken from H&P and prior notes.  Andrew Harding is a 73 y.o. male with medical history significant of insulin -dependent DM 2, A-fib on Eliquis , HTN, CAD status post CABG, CKD 3B, PAD s/p transmetatarsal amputation left foot presenting w/ sepsis, suspected LLE cellulitis vs. Osteomyelitis. Pt reports worsening pain over L foot for multiple weeks.  Noted to have been admitted 03/2023 for issues including MSSA bacteremia, diabetic foot infection.Has had outpt follow up w/ ID and podiatry since discharge.   Noted recent evaluation w/ ID  cultures showing enterobacter, klebsiella and MSSA in the recent culture taken from the lateral wound- transitioned from cefadroxil  to cipro . No reported medical noncompliance. Had follow up with podiatry 05/03/2023 w/ plan for MRI and vascular referral.   On presentation patient was febrile at 100.5, heart rate 100s, WBC 10.8, sodium 127, creatinine 1.42, glucose 242, lactic acid 2.3>>1.1.  DG left foot with soft tissue swelling.  Chest x-ray normal.  Procalcitonin 0.48. Vascular surgery and podiatry was consulted. Started on broad-spectrum antibiotics.  1/10: Vital stable, sodium improved to 132, creatinine at 1.30, leukocytosis resolved, preliminary blood cultures negative.  MRI of left foot with concern of acute osteomyelitis involving the remaining first metatarsal, base of fifth metatarsal, also involving the remaining proximal shafts of second and third metatarsal stem. Vascular is planning to do left lower extremity angiography with possible intervention today.  1/11: S/p left lower extremity angiography and PCI, podiatry is recommending BKA.  1/4 blood culture with Enterobacter with no specific identification yet.  Continue current antibiotics.  1/12: Hemodynamically stable, blood cultures from 1/9  growing Morganella Morgan I-pending susceptibility.  Repeat cultures from 1/10 remain negative    Assessment and Plan: Sepsis [facility]) Meeting sepsis criteria based on T 100.5, HR 100s, WBC 10.8  Lactate 2.3>>1.1, PCT 0.48 , preliminary blood cultures, 1/4 bottles with Enterobacter with no specific ID.  MRI of left foot with concern of acute osteomyelitis involving the pretty much whole left over TMA stump.  S/p angiography and PCI ID, podiatry and vascular surgery is on board Continue IV cefepime , flagyl  and vancomycin  for broad spectrum coverage  -Likely will need BKA   Diabetic foot infection with possible osteomyelitis, left  (HCC) Also recently seen by ID via Dr. Fayette 05/08/2022 with cultures showing enterobacter, klebsiella and MSSA in the recent culture taken from the lateral wound.  Transitioned to cipo po  L foot plain films grossly stable but MRI foot concerning for acute osteomyelitis involving the left over metatarsal from TMA -Continue with broad-spectrum antibiotics -High risk for further amputation   Atrial fibrillation (HCC) Currently rate controlled Cont eliquis  and metoprolol   Monitor    Benign essential hypertension BP stable  Titrate home regimen    CKD (chronic kidney disease) stage 3, GFR 30-59 ml/min (HCC) Creatinine with some improvement to 1.3, baseline appears to be around 1.2.  Did not met criteria for AKI. -Monitor renal function -Avoid nephrotoxins   Coronary artery disease Baseline CAD s/p CABG  No active CP  Cont home regimen    Mixed hyperlipidemia Cont statin    Type 2 diabetes mellitus with stage 3b chronic kidney disease, with long-term current use of insulin  (HCC) SSI  Long acting insulin   Monitor    Subjective: Patient with no new concern.  Denies any pain  Physical  Exam: Vitals:   05/12/23 1631 05/12/23 2000 05/13/23 0511 05/13/23 0800  BP: 129/81 127/72 129/83 121/71  Pulse: 92 89 79 81  Resp: 18 18 16 18   Temp:  98.7 F (37.1 C) 98.4 F (36.9 C) 98.6 F (37 C) 97.6 F (36.4 C)  TempSrc:  Oral Oral Oral  SpO2: 99% 99% 100% 97%  Weight:      Height:       General.  Well-developed gentleman, in no acute distress. Pulmonary.  Lungs clear bilaterally, normal respiratory effort. CV.  Regular rate and rhythm, no JVD, rub or murmur. Abdomen.  Soft, nontender, nondistended, BS positive. CNS.  Alert and oriented .  No focal neurologic deficit. Extremities.  Left TMA and Ace wrap Psychiatry.  Judgment and insight appears normal.    Data Reviewed: Prior data reviewed  Family Communication:   Disposition: Status is: Inpatient Remains inpatient appropriate because: Severity of illness  Planned Discharge Destination: Home  Time spent: 45 minutes  This record has been created using Conservation officer, historic buildings. Errors have been sought and corrected,but may not always be located. Such creation errors do not reflect on the standard of care.   Author: Amaryllis Dare, MD 05/13/2023 1:16 PM  For on call review [email or web address].

## 2023-05-14 ENCOUNTER — Encounter: Payer: Self-pay | Admitting: Vascular Surgery

## 2023-05-14 DIAGNOSIS — L089 Local infection of the skin and subcutaneous tissue, unspecified: Secondary | ICD-10-CM | POA: Diagnosis not present

## 2023-05-14 DIAGNOSIS — I70245 Atherosclerosis of native arteries of left leg with ulceration of other part of foot: Secondary | ICD-10-CM

## 2023-05-14 DIAGNOSIS — L97529 Non-pressure chronic ulcer of other part of left foot with unspecified severity: Secondary | ICD-10-CM | POA: Diagnosis not present

## 2023-05-14 DIAGNOSIS — B9689 Other specified bacterial agents as the cause of diseases classified elsewhere: Secondary | ICD-10-CM

## 2023-05-14 DIAGNOSIS — R7881 Bacteremia: Secondary | ICD-10-CM | POA: Diagnosis not present

## 2023-05-14 DIAGNOSIS — S91302A Unspecified open wound, left foot, initial encounter: Secondary | ICD-10-CM | POA: Diagnosis not present

## 2023-05-14 DIAGNOSIS — M86172 Other acute osteomyelitis, left ankle and foot: Secondary | ICD-10-CM | POA: Diagnosis not present

## 2023-05-14 LAB — CULTURE, BLOOD (ROUTINE X 2): Culture  Setup Time: NO GROWTH

## 2023-05-14 LAB — GLUCOSE, CAPILLARY
Glucose-Capillary: 143 mg/dL — ABNORMAL HIGH (ref 70–99)
Glucose-Capillary: 219 mg/dL — ABNORMAL HIGH (ref 70–99)
Glucose-Capillary: 173 mg/dL — ABNORMAL HIGH (ref 70–99)
Glucose-Capillary: 235 mg/dL — ABNORMAL HIGH (ref 70–99)

## 2023-05-14 NOTE — TOC CM/SW Note (Signed)
 Transition of Care [facility]) - Inpatient Brief Assessment   Patient Details  Name: Andrew Harding MRN: [id number] Date of Birth: [birth year]  Transition of Care [facility]) CM/SW Contact:    Lauraine JAYSON Carpen, LCSW Phone Number: 05/14/2023, 10:44 AM   Clinical Narrative: CSW reviewed chart. No TOC needs identified. PT and OT determined no needs. CSW will continue to follow progress. Please place another [facility] consult if any needs arise.  Transition of Care Asessment: Insurance and Status: Insurance coverage has been reviewed Patient has primary care physician: Yes Home environment has been reviewed: Single familiy home Prior level of function:: Independent Prior/Current Home Services: No current home services Social Drivers of Health Review: SDOH reviewed no interventions necessary Readmission risk has been reviewed: Yes Transition of care needs: no transition of care needs at this time

## 2023-05-14 NOTE — Progress Notes (Signed)
 Pharmacy Antibiotic Note  Andrew Harding is a 73 y.o. male admitted on 05/10/2023 with sepsis. Patient presenting after syncopal episode at outpatient wound care clinic. 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 Patient was seen by ID as outpatient on 1/2 and culture sent (grew E cloacae, K. Aerogenes and MSSA) subsequently his antibiotics were changed from cefadroxil  to ciprofloxacin  on 1/8.  In ED, patient is afebrile, WBC 10.8, LA 2.3. Pharmacy has been consulted for cefepime  dosing.  Today, 05/14/2023 Day #5 Cefepime  and metronidazole  Renal: SCR 1.21 on last check WBC WNL Afebrile 1/9 blood cx: M. Morganii (Resistant to amp, amp/sulb) 1/10 repeat Blood cx: NGTD 1/13 angioplasty  Plan: Continue cefepime  2 g IV q8h per current renal function which covers bacteria from 1/2 wound culture and blood culture Monitor renal function, clinical status, culture data, and LOT Await plans for foot/TMA infection, podiatry feels BKA best option for patient   Height: 6' 3 (190.5 cm) Weight: 101 kg (222 lb 10.6 oz) IBW/kg (Calculated) : 84.5  Temp (24hrs), Avg:98.5 F (36.9 C), Min:98.1 F (36.7 C), Max:98.7 F (37.1 C)  Recent Labs  Lab 05/10/23 0939 05/10/23 1149 05/11/23 0501 05/12/23 0449  WBC 10.8*  --  6.4 4.6  CREATININE 1.42*  --  1.30* 1.21  LATICACIDVEN 2.3* 1.1  --   --     Estimated Creatinine Clearance: 65 mL/min (by C-G formula based on SCr of 1.21 mg/dL).    No Known Allergies  Antimicrobials this admission: cefepime  1/9 >> 1/10 vancomycin  1/9 >>  Metronidazole  1/9 >>  Dose adjustments this admission: N/A  Microbiology results: 1/9 BCx: M morganii 1/9 MRSA PCR: pending  Thank you for involving pharmacy in this patient's care.   Dottie Vaquerano, PharmD, BCPS, BCIDP Work Cell: [phone] 05/14/2023 12:21 PM

## 2023-05-14 NOTE — Progress Notes (Signed)
 Physical Therapy Treatment Patient Details Name: Andrew Harding MRN: [id number] DOB: 05-12-1950 Today's Date: 05/14/2023   History of Present Illness Andrew Harding is a 73 y.o. male with medical history significant of insulin -dependent DM 2, A-fib on Eliquis , HTN, CAD status post CABG, CKD 3B, PAD s/p transmetatarsal amputation left foot presenting w/ sepsis, suspected LLE cellulitis vs. Osteomyelitis. Pt reports worsening pain over L foot for multiple weeks.  Noted to have been admitted 03/2023 for issues including MSSA bacteremia, diabetic foot infection.Has had outpt follow up w/ ID and podiatry since discharge.  Admitted for sepsis. MRI of left foot with concern of acute osteomyelitis involving the remaining first metatarsal, base of fifth metatarsal, also involving the remaining proximal shafts of second and third metatarsal stem. Also recently seen by ID via Dr. Fayette 05/08/2022 with cultures showing enterobacter, klebsiella and MSSA in the recent culture taken from the lateral wound. As of 1/13 pt now agreeable to limb salvage amputation, planning to go on 05/16/23.    PT Comments  Pt in bed on entry, awake and agreeable to session. Pt remains largely pain free. Pt is able to AMB overground >584ft at his own pace, no signs of exertion, VSS this date, no rates >100bpm. Pt continues to have intermittent sway when AMB, minimal, characteristic of his baseline balance deficits. Pt explains the plan to go for amputation on Wednesday. Author offers to begin mobility training in preparation for postoperative phase, which pt agrees is a good idea- will plan to begin this 1/14. Pt is open to STR after surgery which will be instrumental given the number of entry stairs pt has and the need to be NWB. Will continue to follow.     If plan is discharge home, recommend the following:     Can travel by private vehicle     No  Equipment Recommendations  Other (comment) (defer to rehab facility DC)     Recommendations for Other Services       Precautions / Restrictions Precautions Precautions: Fall Required Braces or Orthoses: Other Brace Other Brace: has an ortho shoe for left foot, but no current order to use it Restrictions Weight Bearing Restrictions Per Provider Order: No     Mobility  Bed Mobility Overal bed mobility: Independent                  Transfers Overall transfer level: Independent Equipment used: None                    Ambulation/Gait Ambulation/Gait assistance: Supervision, Contact guard assist Gait Distance (Feet): 525 Feet Assistive device: None   Gait velocity: 0.67m/s   Pre-gait activities: shoe donning, no assist needed; General Gait Details: intermittent unsteadiness, but no assist needed for balance.   Stairs             Wheelchair Mobility     Tilt Bed    Modified Rankin (Stroke Patients Only)       Balance Overall balance assessment: Mild deficits observed, not formally tested                                          Cognition Arousal: Alert Behavior During Therapy: WFL for tasks assessed/performed Overall Cognitive Status: Within Functional Limits for tasks assessed  Exercises      General Comments        Pertinent Vitals/Pain Pain Assessment Pain Assessment: No/denies pain    Home Living                          Prior Function            PT Goals (current goals can now be found in the care plan section) Acute Rehab PT Goals Patient Stated Goal: prepare for post amputation mobility needs PT Goal Formulation: With patient Time For Goal Achievement: 05/26/23 Potential to Achieve Goals: Good Progress towards PT goals: Progressing toward goals    Frequency    Min 1X/week      PT Plan      Co-evaluation              AM-PAC PT 6 Clicks Mobility   Outcome Measure  Help needed turning  from your back to your side while in a flat bed without using bedrails?: None Help needed moving from lying on your back to sitting on the side of a flat bed without using bedrails?: None Help needed moving to and from a bed to a chair (including a wheelchair)?: None Help needed standing up from a chair using your arms (e.g., wheelchair or bedside chair)?: None Help needed to walk in hospital room?: None Help needed climbing 3-5 steps with a railing? : A Little 6 Click Score: 23    End of Session   Activity Tolerance: Patient tolerated treatment well;No increased pain Patient left: in bed;with call bell/phone within reach Nurse Communication: Mobility status PT Visit Diagnosis: Unsteadiness on feet (R26.81);Difficulty in walking, not elsewhere classified (R26.2)     Time: 8898-8888 PT Time Calculation (min) (ACUTE ONLY): 10 min  Charges:    $Therapeutic Activity: 8-22 mins PT General Charges $$ ACUTE PT VISIT: 1 Visit                    5:31 PM, 05/14/23 Andrew Harding, PT, DPT Physical Therapist - [facility]  [phone] (ASCOM)      Andrew Harding 05/14/2023, 5:28 PM

## 2023-05-14 NOTE — Progress Notes (Signed)
 Date of Admission:  05/10/2023   T   ID: Andrew Harding is a 73 y.o. male  Active Problems:   Type 2 diabetes mellitus with stage 3b chronic kidney disease, with long-term current use of insulin  (HCC)   Benign essential hypertension   CKD (chronic kidney disease) stage 3, GFR 30-59 ml/min (HCC)   Coronary artery disease   Mixed hyperlipidemia   Atrial fibrillation (HCC)   Diabetic foot infection with possible osteomyelitis, left  (HCC)   Sepsis (HCC)   Open wound of left foot with complication   Gram-negative bacteremia    Subjective: Pt had angio which showed tibial level disease  Medications:   enoxaparin  (LOVENOX ) injection  100 mg Subcutaneous Q12H   insulin  aspart  0-15 Units Subcutaneous TID WC   insulin  aspart  0-5 Units Subcutaneous QHS   insulin  glargine-yfgn  20 Units Subcutaneous Daily    Objective: Vital signs in last 24 hours: Patient Vitals for the past 24 hrs:  BP Temp Temp src Pulse Resp SpO2  05/14/23 0820 138/84 98.7 F (37.1 C) -- (!) 105 18 99 %  05/14/23 0543 130/79 98.1 F (36.7 C) Oral 79 -- 97 %  05/13/23 2056 108/74 98.4 F (36.9 C) Oral 91 18 96 %  05/13/23 1738 (!) 116/57 98.6 F (37 C) Oral 95 18 100 %      PHYSICAL EXAM:  General: Alert, cooperative, no distress, appears stated age.  Lungs: Clear to auscultation bilaterally. No Wheezing or Rhonchi. No rales. Heart: irregular Abdomen: Soft, non-tender,not distended. Bowel sounds normal. No masses Extremities:       Skin: No rashes or lesions. Or bruising Lymph: Cervical, supraclavicular normal. Neurologic: Grossly non-focal  Lab Results    Latest Ref Rng & Units 05/12/2023    4:49 AM 05/11/2023    5:01 AM 05/10/2023    9:39 AM  CBC  WBC 4.0 - 10.5 K/uL 4.6  6.4  10.8   Hemoglobin 13.0 - 17.0 g/dL 89.3  89.2  88.5   Hematocrit 39.0 - 52.0 % 31.1  31.5  34.6   Platelets 150 - 400 K/uL 228  227  293        Latest Ref Rng & Units 05/12/2023    4:49 AM 05/11/2023    5:01 AM  05/10/2023    9:39 AM  CMP  Glucose 70 - 99 mg/dL 851  846  757   BUN 8 - 23 mg/dL 15  14  15    Creatinine 0.61 - 1.24 mg/dL 8.78  8.69  8.57   Sodium 135 - 145 mmol/L 135  132  127   Potassium 3.5 - 5.1 mmol/L 3.6  3.6  4.0   Chloride 98 - 111 mmol/L 105  100  96   CO2 22 - 32 mmol/L 19  21  18    Calcium  8.9 - 10.3 mg/dL 8.5  8.4  8.6   Total Protein 6.5 - 8.1 g/dL  6.9  7.6   Total Bilirubin 0.0 - 1.2 mg/dL  0.8  0.6   Alkaline Phos 38 - 126 U/L  52  58   AST 15 - 41 U/L  26  28   ALT 0 - 44 U/L  19  20       Microbiology:  Studies/Results: No results found.    Assessment/Plan: Morganella bacteremia on cefepime - will treat with total of 7-10 days  Diabetic foot infection Left foot with ulcer on the lateral margin TMA of the left foot  Has neuropathy and PAD Recent culture was enterobacter, klebsiella and MSSA and he started ciprofloxacin  yesterday,Awaiting MRI Pt on his feet at work every day and does not off load pressure off that foot   on vanco , cefepime  and flagyl - can DC vanco MRI of the foot shows osteo of 5th metatarsal  It is a difficult situation where just antibiotics alone wont suffice-   Pt is going for BKA this week  CKD-   CAD s/p CABG   Afib on eliquis  and metoprolol  Discussed the management with the patient

## 2023-05-14 NOTE — Progress Notes (Signed)
  Progress Note    05/14/2023 11:16 AM 3 Days Post-Op  Subjective: Andrew Harding is a 73 year old male who is now postop day 3 from left lower extremity angiogram with angioplasty to tibial artery. Patient is recovering as expected.  Patient was seen by podiatry over the weekend and deemed to need left lower extremity below the knee amputation due to extensive osteomyelitis.  In a detailed discussion with the patient this morning patient agrees this time to have a left lower extremity below the knee amputation.  He is willing to proceed.  No other complaints and vitals all remained stable.   Vitals:   05/14/23 0543 05/14/23 0820  BP: 130/79 138/84  Pulse: 79 (!) 105  Resp:  18  Temp: 98.1 F (36.7 C) 98.7 F (37.1 C)  SpO2: 97% 99%   Physical Exam: Cardiac:  RRR Lungs: Clear throughout on auscultation, no rales rhonchi or wheezing Incisions: Left foot debridement by podiatry with dressing clean dry and intact.  Right groin puncture site with dressing clean dry and intact from angiogram. Extremities: Left foot osteomyelitis will need left lower below the knee amputation. Abdomen: Soft, nontender nondistended positive bowel sounds Neurologic: A&OX3  CBC    Component Value Date/Time   WBC 4.6 05/12/2023 0449   RBC 3.72 (L) 05/12/2023 0449   HGB 10.6 (L) 05/12/2023 0449   HCT 31.1 (L) 05/12/2023 0449   PLT 228 05/12/2023 0449   MCV 83.6 05/12/2023 0449   MCH 28.5 05/12/2023 0449   MCHC 34.1 05/12/2023 0449   RDW 13.7 05/12/2023 0449   LYMPHSABS 0.4 (L) 05/10/2023 0939   MONOABS 0.4 05/10/2023 0939   EOSABS 0.0 05/10/2023 0939   BASOSABS 0.0 05/10/2023 0939    BMET    Component Value Date/Time   NA 135 05/12/2023 0449   K 3.6 05/12/2023 0449   CL 105 05/12/2023 0449   CO2 19 (L) 05/12/2023 0449   GLUCOSE 148 (H) 05/12/2023 0449   GLUCOSE 402 (H) 09/17/2012 1417   BUN 15 05/12/2023 0449   CREATININE 1.21 05/12/2023 0449   CALCIUM  8.5 (L) 05/12/2023 0449   GFRNONAA >60  05/12/2023 0449   GFRAA 58 (L) 06/02/2015 0656    INR    Component Value Date/Time   INR 1.3 (H) 05/10/2023 0939     Intake/Output Summary (Last 24 hours) at 05/14/2023 1116 Last data filed at 05/13/2023 1500 Gross per 24 hour  Intake --  Output 220 ml  Net -220 ml     Assessment/Plan:  73 y.o. male is s/p left lower extremity angiogram 3 Days Post-Op   PLAN: Vascular surgery was asked to see Andrew Harding again by podiatry for left lower extremity below the knee amputation.  I had a long detailed discussion with the patient this morning regarding left lower below the knee amputation procedure, benefits, risks and complications.  Patient verbalizes understanding and wishes to proceed.  I answered all the patient's questions this morning.  Patient will be made n.p.o. at midnight the day before surgery.  Plan is to take the patient to surgery on Wednesday, 05/16/2023.  DVT prophylaxis: Lovenox  100 mg every 12 hours   [name] [facility] 05/14/2023 11:16 AM

## 2023-05-14 NOTE — Plan of Care (Signed)
  Problem: Fluid Volume: Goal: Hemodynamic stability will improve Outcome: Progressing   Problem: Clinical Measurements: Goal: Diagnostic test results will improve Outcome: Progressing   Problem: Respiratory: Goal: Ability to maintain adequate ventilation will improve Outcome: Progressing   Problem: Coping: Goal: Ability to adjust to condition or change in health will improve Outcome: Progressing

## 2023-05-14 NOTE — Progress Notes (Signed)
 Progress Note   [name] FMW:[id number] DOB: 18-Mar-1951 DOA: 05/10/2023     4 DOS: the patient was seen and examined on 05/14/2023   Brief hospital course: Taken from H&P and prior notes.  Andrew Harding is a 73 y.o. male with medical history significant of insulin -dependent DM 2, A-fib on Eliquis , HTN, CAD status post CABG, CKD 3B, PAD s/p transmetatarsal amputation left foot presenting w/ sepsis, suspected LLE cellulitis vs. Osteomyelitis. Pt reports worsening pain over L foot for multiple weeks.  Noted to have been admitted 03/2023 for issues including MSSA bacteremia, diabetic foot infection.Has had outpt follow up w/ ID and podiatry since discharge.   Noted recent evaluation w/ ID  cultures showing enterobacter, klebsiella and MSSA in the recent culture taken from the lateral wound- transitioned from cefadroxil  to cipro . No reported medical noncompliance. Had follow up with podiatry 05/03/2023 w/ plan for MRI and vascular referral.   On presentation patient was febrile at 100.5, heart rate 100s, WBC 10.8, sodium 127, creatinine 1.42, glucose 242, lactic acid 2.3>>1.1.  DG left foot with soft tissue swelling.  Chest x-ray normal.  Procalcitonin 0.48. Vascular surgery and podiatry was consulted. Started on broad-spectrum antibiotics.  1/10: Vital stable, sodium improved to 132, creatinine at 1.30, leukocytosis resolved, preliminary blood cultures negative.  MRI of left foot with concern of acute osteomyelitis involving the remaining first metatarsal, base of fifth metatarsal, also involving the remaining proximal shafts of second and third metatarsal stem. Vascular is planning to do left lower extremity angiography with possible intervention today.  1/11: S/p left lower extremity angiography and PCI, podiatry is recommending BKA.  1/4 blood culture with Enterobacter with no specific identification yet.  Continue current antibiotics.  1/12: Hemodynamically stable, blood cultures from 1/9  growing Morganella Morgan I-pending susceptibility.  Repeat cultures from 1/10 remain negative  1/13: Hemodynamically stable, Morganella Morgagni susceptibility shows resistance to ampicillin  and Augmentin .  Vascular surgery is planning BKA on Wednesday.    Assessment and Plan: Sepsis [facility]) Meeting sepsis criteria based on T 100.5, HR 100s, WBC 10.8  Lactate 2.3>>1.1, PCT 0.48 , preliminary blood cultures, 1/4 bottles with Enterobacter with no specific ID.  MRI of left foot with concern of acute osteomyelitis involving the pretty much whole left over TMA stump.  S/p angiography and PCI ID, podiatry and vascular surgery is on board Continue IV cefepime , flagyl   -Vascular surgery is planning BKA on Wednesday   Diabetic foot infection with possible osteomyelitis, left  (HCC) Also recently seen by ID via Dr. Fayette 05/08/2022 with cultures showing enterobacter, klebsiella and MSSA in the recent culture taken from the lateral wound.  Transitioned to cipo po  L foot plain films grossly stable but MRI foot concerning for acute osteomyelitis involving the left over metatarsal from TMA -Continue with broad-spectrum antibiotics -BKA on Wednesday   Atrial fibrillation (HCC) Currently rate controlled Cont eliquis  and metoprolol   Monitor    Benign essential hypertension BP stable  Titrate home regimen    CKD (chronic kidney disease) stage 3, GFR 30-59 ml/min (HCC) Creatinine with some improvement to 1.3, baseline appears to be around 1.2.  Did not met criteria for AKI. -Monitor renal function -Avoid nephrotoxins   Coronary artery disease Baseline CAD s/p CABG  No active CP  Cont home regimen    Mixed hyperlipidemia Cont statin    Type 2 diabetes mellitus with stage 3b chronic kidney disease, with long-term current use of insulin  (HCC) SSI  Long acting insulin   Monitor    Subjective: Patient was seen and examined today.  No new concern.  BKA on Wednesday  Physical  Exam: Vitals:   05/13/23 1738 05/13/23 2056 05/14/23 0543 05/14/23 0820  BP: (!) 116/57 108/74 130/79 138/84  Pulse: 95 91 79 (!) 105  Resp: 18 18  18   Temp: 98.6 F (37 C) 98.4 F (36.9 C) 98.1 F (36.7 C) 98.7 F (37.1 C)  TempSrc: Oral Oral Oral   SpO2: 100% 96% 97% 99%  Weight:      Height:       General.  Well-developed gentleman, in no acute distress. Pulmonary.  Lungs clear bilaterally, normal respiratory effort. CV.  Regular rate and rhythm, no JVD, rub or murmur. Abdomen.  Soft, nontender, nondistended, BS positive. CNS.  Alert and oriented .  No focal neurologic deficit. Extremities.  Left foot with TMA and Ace wrap Psychiatry.  Judgment and insight appears normal.     Data Reviewed: Prior data reviewed  Family Communication: Talked with wife on phone.  Disposition: Status is: Inpatient Remains inpatient appropriate because: Severity of illness  Planned Discharge Destination: Home  Time spent: 44 minutes  This record has been created using Conservation officer, historic buildings. Errors have been sought and corrected,but may not always be located. Such creation errors do not reflect on the standard of care.   Author: Amaryllis Dare, MD 05/14/2023 1:44 PM  For on call review [email or web address].

## 2023-05-14 NOTE — Op Note (Signed)
 [facility]  Percutaneous Study/Intervention Procedural Note   Date of Surgery: 05/11/2023  Surgeon(s):Sonji Starkes    Assistants:none  Pre-operative Diagnosis: PAD with ulceration and infection of the left foot  Post-operative diagnosis:  Same  Procedure(s) Performed:             1.  Ultrasound guidance for vascular access right femoral artery             2.  Catheter placement into left common femoral artery from right femoral approach             3.  Aortogram and selective left lower extremity angiogram             4.  Percutaneous transluminal angioplasty of left anterior tibial artery with 3 mm diameter by 22 cm length angioplasty balloon             5.  StarClose closure device right femoral artery  EBL: 5 cc  Contrast: 50 cc  Fluoro Time: 3.2 minutes  Moderate Conscious Sedation Time: approximately 41 minutes using 3 mg of Versed  and 100 mcg of Fentanyl               Indications:  Patient is a 73 y.o.male with ulceration and infection of the left foot. The patient is brought in for angiography for further evaluation and potential treatment.  Due to the limb threatening nature of the situation, angiogram was performed for attempted limb salvage. The patient is aware that if the procedure fails, amputation would be expected.  The patient also understands that even with successful revascularization, amputation may still be required due to the severity of the situation.  Risks and benefits are discussed and informed consent is obtained.   Procedure:  The patient was identified and appropriate procedural time out was performed.  The patient was then placed supine on the table and prepped and draped in the usual sterile fashion. Moderate conscious sedation was administered during a face to face encounter with the patient throughout the procedure with my supervision of the RN administering medicines and monitoring the patient's vital signs, pulse oximetry,  telemetry and mental status throughout from the start of the procedure until the patient was taken to the recovery room. Ultrasound was used to evaluate the right common femoral artery.  It was patent .  A digital ultrasound image was acquired.  A Seldinger needle was used to access the right common femoral artery under direct ultrasound guidance and a permanent image was performed.  A 0.035 J wire was advanced without resistance and a 5Fr sheath was placed.  Pigtail catheter was placed into the aorta and an AP aortogram was performed. This demonstrated normal renal arteries and normal aorta and iliac segments without significant stenosis. I then crossed the aortic bifurcation and advanced to the left femoral head. Selective left lower extremity angiogram was then performed. This demonstrated fairly normal common femoral artery, profunda femoris artery, superficial femoral and popliteal arteries without significant disease.  There was severe tibial level disease.  The anterior tibial artery was the only vessel into the foot but it had a short to medium segment occlusion in the proximal to mid segment.  The peroneal and posterior tibial arteries were occluded without good distal reconstitution. It was felt that it was in the patient's best interest to proceed with intervention after these images to avoid a second procedure and a larger amount of contrast and fluoroscopy based off of the findings from the initial angiogram. The  patient was systemically heparinized and a 6 French 70 cm sheath was then placed over the Terumo Advantage wire. I then used a Kumpe catheter and the advantage wire to navigate into the anterior tibial artery were exchanged for a 0.018 advantage wire and a Nava cross catheter and crossed the occlusion without difficulty confirming intraluminal flow distally.  I then replaced the wire and proceeded with treatment.  A 3 mm diameter by 22 cm length angioplasty balloon was inflated to 10 atm for 1  minute.  Completion imaging showed marked improvement with less than 30% residual stenosis in the anterior tibial artery which was now continuous to the foot. I elected to terminate the procedure. The sheath was removed and StarClose closure device was deployed in the right femoral artery with excellent hemostatic result. The patient was taken to the recovery room in stable condition having tolerated the procedure well.  Findings:               Aortogram:  This demonstrated normal renal arteries and normal aorta and iliac segments without significant stenosis.              Left Lower Extremity:  This demonstrated fairly normal common femoral artery, profunda femoris artery, superficial femoral and popliteal arteries without significant disease.  There was severe tibial level disease.  The anterior tibial artery was the only vessel into the foot but it had a short to medium segment occlusion in the proximal to mid segment.  The peroneal and posterior tibial arteries were occluded without good distal reconstitution.   Disposition: Patient was taken to the recovery room in stable condition having tolerated the procedure well.  Complications: None  Selinda Gu 05/14/2023 11:37 AM   This note was created with Dragon Medical transcription system. Any errors in dictation are purely unintentional.

## 2023-05-15 DIAGNOSIS — B9689 Other specified bacterial agents as the cause of diseases classified elsewhere: Secondary | ICD-10-CM | POA: Diagnosis not present

## 2023-05-15 DIAGNOSIS — R7881 Bacteremia: Secondary | ICD-10-CM | POA: Diagnosis not present

## 2023-05-15 DIAGNOSIS — M86172 Other acute osteomyelitis, left ankle and foot: Secondary | ICD-10-CM | POA: Diagnosis not present

## 2023-05-15 DIAGNOSIS — S91302A Unspecified open wound, left foot, initial encounter: Secondary | ICD-10-CM | POA: Diagnosis not present

## 2023-05-15 LAB — GLUCOSE, CAPILLARY
Glucose-Capillary: 157 mg/dL — ABNORMAL HIGH (ref 70–99)
Glucose-Capillary: 165 mg/dL — ABNORMAL HIGH (ref 70–99)
Glucose-Capillary: 129 mg/dL — ABNORMAL HIGH (ref 70–99)
Glucose-Capillary: 132 mg/dL — ABNORMAL HIGH (ref 70–99)

## 2023-05-15 MED ORDER — CEFAZOLIN SODIUM-DEXTROSE 2-4 GM/100ML-% IV SOLN
2.0000 g | INTRAVENOUS | Status: AC
  Administered 2023-05-16: 2 g via INTRAVENOUS
  Filled 2023-05-15: qty 100

## 2023-05-15 MED ORDER — CHLORHEXIDINE GLUCONATE 4 % EX SOLN
60.0000 mL | Freq: Once | CUTANEOUS | Status: AC
  Administered 2023-05-15: 4 via TOPICAL

## 2023-05-15 MED ORDER — CHLORHEXIDINE GLUCONATE 4 % EX SOLN
60.0000 mL | Freq: Once | CUTANEOUS | Status: AC
  Administered 2023-05-16: 4 via TOPICAL

## 2023-05-15 MED ORDER — SODIUM CHLORIDE 0.9 % IV SOLN
INTRAVENOUS | Status: DC
Start: 2023-05-15 — End: 2023-05-16

## 2023-05-15 MED ORDER — INSULIN GLARGINE-YFGN 100 UNIT/ML ~~LOC~~ SOLN
22.0000 [IU] | Freq: Every day | SUBCUTANEOUS | Status: DC
  Administered 2023-05-15: 22 [IU] via SUBCUTANEOUS
  Filled 2023-05-15 (×2): qty 0.22

## 2023-05-15 NOTE — Plan of Care (Signed)
  Problem: Fluid Volume: Goal: Hemodynamic stability will improve Outcome: Progressing   Problem: Clinical Measurements: Goal: Diagnostic test results will improve Outcome: Progressing Goal: Signs and symptoms of infection will decrease Outcome: Progressing   Problem: Respiratory: Goal: Ability to maintain adequate ventilation will improve Outcome: Progressing   Problem: Education: Goal: Ability to describe self-care measures that may prevent or decrease complications (Diabetes Survival Skills Education) will improve Outcome: Progressing Goal: Individualized Educational Video(s) Outcome: Progressing

## 2023-05-15 NOTE — H&P (View-Only) (Signed)
  Progress Note    05/15/2023 10:35 AM 4 Days Post-Op  Subjective:   Andrew Harding is a 73 year old male who is now postop day 3 from left lower extremity angiogram with angioplasty to tibial artery. Patient is recovering as expected.  Patient was seen by podiatry over the weekend and deemed to need left lower extremity below the knee amputation due to extensive osteomyelitis.  In a detailed discussion with the patient this morning patient agrees this time to have a left lower extremity below the knee amputation.  He is willing to proceed.  No other complaints and vitals all remained stable.    Vitals:   05/15/23 0458 05/15/23 0752  BP: 125/81 122/79  Pulse: 76 80  Resp: 20 18  Temp: 97.7 F (36.5 C) 98 F (36.7 C)  SpO2: 94% 98%   Physical Exam: Cardiac:  RRR Lungs: Clear throughout on auscultation, no rales rhonchi or wheezing Incisions: Left foot debridement by podiatry with dressing clean dry and intact.  Right groin puncture site with dressing clean dry and intact from angiogram. Extremities: Left foot osteomyelitis will need left lower below the knee amputation. Abdomen: Soft, nontender nondistended positive bowel sounds Neurologic: A&OX3  CBC    Component Value Date/Time   WBC 4.6 05/12/2023 0449   RBC 3.72 (L) 05/12/2023 0449   HGB 10.6 (L) 05/12/2023 0449   HCT 31.1 (L) 05/12/2023 0449   PLT 228 05/12/2023 0449   MCV 83.6 05/12/2023 0449   MCH 28.5 05/12/2023 0449   MCHC 34.1 05/12/2023 0449   RDW 13.7 05/12/2023 0449   LYMPHSABS 0.4 (L) 05/10/2023 0939   MONOABS 0.4 05/10/2023 0939   EOSABS 0.0 05/10/2023 0939   BASOSABS 0.0 05/10/2023 0939    BMET    Component Value Date/Time   NA 135 05/12/2023 0449   K 3.6 05/12/2023 0449   CL 105 05/12/2023 0449   CO2 19 (L) 05/12/2023 0449   GLUCOSE 148 (H) 05/12/2023 0449   GLUCOSE 402 (H) 09/17/2012 1417   BUN 15 05/12/2023 0449   CREATININE 1.21 05/12/2023 0449   CALCIUM  8.5 (L) 05/12/2023 0449   GFRNONAA >60  05/12/2023 0449   GFRAA 58 (L) 06/02/2015 0656    INR    Component Value Date/Time   INR 1.3 (H) 05/10/2023 0939     Intake/Output Summary (Last 24 hours) at 05/15/2023 1035 Last data filed at 05/15/2023 9047 Gross per 24 hour  Intake 120 ml  Output --  Net 120 ml     Assessment/Plan:  73 y.o. male is s/p left lower extremity angiogram  4 Days Post-Op   PLAN: Vascular surgery plans on taking the patient to the operating room for left lower extremity below the knee amputation tomorrow on 05/16/2023   I had a long detailed discussion with the patient this morning regarding left lower below the knee amputation procedure, benefits, risks and complications.  Patient verbalizes understanding and wishes to proceed.  I answered all the patient's questions this morning.  Patient will be made n.p.o. at midnight the day before surgery.  Plan is to take the patient to surgery on Wednesday, 05/16/2023.   DVT prophylaxis: Lovenox  100 mg every 12 hours   [name] 05/15/2023 10:35 AM

## 2023-05-15 NOTE — Progress Notes (Signed)
 Progress Note   [name] FMW:[id number] DOB: October 18, 1950 DOA: 05/10/2023     5 DOS: the patient was seen and examined on 05/15/2023   Brief hospital course: Taken from H&P and prior notes.  Andrew Harding is a 73 y.o. male with medical history significant of insulin -dependent DM 2, A-fib on Eliquis , HTN, CAD status post CABG, CKD 3B, PAD s/p transmetatarsal amputation left foot presenting w/ sepsis, suspected LLE cellulitis vs. Osteomyelitis. Pt reports worsening pain over L foot for multiple weeks.  Noted to have been admitted 03/2023 for issues including MSSA bacteremia, diabetic foot infection.Has had outpt follow up w/ ID and podiatry since discharge.   Noted recent evaluation w/ ID  cultures showing enterobacter, klebsiella and MSSA in the recent culture taken from the lateral wound- transitioned from cefadroxil  to cipro . No reported medical noncompliance. Had follow up with podiatry 05/03/2023 w/ plan for MRI and vascular referral.   On presentation patient was febrile at 100.5, heart rate 100s, WBC 10.8, sodium 127, creatinine 1.42, glucose 242, lactic acid 2.3>>1.1.  DG left foot with soft tissue swelling.  Chest x-ray normal.  Procalcitonin 0.48. Vascular surgery and podiatry was consulted. Started on broad-spectrum antibiotics.  1/10: Vital stable, sodium improved to 132, creatinine at 1.30, leukocytosis resolved, preliminary blood cultures negative.  MRI of left foot with concern of acute osteomyelitis involving the remaining first metatarsal, base of fifth metatarsal, also involving the remaining proximal shafts of second and third metatarsal stem. Vascular is planning to do left lower extremity angiography with possible intervention today.  1/11: S/p left lower extremity angiography and PCI, podiatry is recommending BKA.  1/4 blood culture with Enterobacter with no specific identification yet.  Continue current antibiotics.  1/12: Hemodynamically stable, blood cultures from 1/9  growing Morganella Morgan I-pending susceptibility.  Repeat cultures from 1/10 remain negative  1/13: Hemodynamically stable, Morganella Morgagni susceptibility shows resistance to ampicillin  and Augmentin .  Vascular surgery is planning BKA on Wednesday.  1/14: Remained stable, going for BKA with vascular surgery tomorrow.    Assessment and Plan: Sepsis [facility]) Meeting sepsis criteria based on T 100.5, HR 100s, WBC 10.8  Lactate 2.3>>1.1, PCT 0.48 , preliminary blood cultures, 1/4 bottles with Enterobacter with no specific ID.  MRI of left foot with concern of acute osteomyelitis involving the pretty much whole left over TMA stump.  S/p angiography and PCI ID, podiatry and vascular surgery is on board Continue IV cefepime , flagyl   -Vascular surgery is planning BKA on Wednesday   Diabetic foot infection with possible osteomyelitis, left  (HCC) Also recently seen by ID via Dr. Fayette 05/08/2022 with cultures showing enterobacter, klebsiella and MSSA in the recent culture taken from the lateral wound.  Transitioned to cipo po  L foot plain films grossly stable but MRI foot concerning for acute osteomyelitis involving the left over metatarsal from TMA -Continue with broad-spectrum antibiotics -BKA on Wednesday   Atrial fibrillation (HCC) Currently rate controlled Cont eliquis  and metoprolol   Monitor    Benign essential hypertension BP stable  Titrate home regimen    CKD (chronic kidney disease) stage 3, GFR 30-59 ml/min (HCC) Creatinine with some improvement to 1.3, baseline appears to be around 1.2.  Did not met criteria for AKI. -Monitor renal function -Avoid nephrotoxins   Coronary artery disease Baseline CAD s/p CABG  No active CP  Cont home regimen    Mixed hyperlipidemia Cont statin    Type 2 diabetes mellitus with stage 3b chronic kidney disease, with long-term  current use of insulin  (HCC) SSI  Long acting insulin   Monitor    Subjective: Patient was seen  and examined.  No new concern.  Physical Exam: Vitals:   05/14/23 2029 05/14/23 2033 05/15/23 0458 05/15/23 0752  BP: 117/69 117/69 125/81 122/79  Pulse: 83 65 76 80  Resp: 20 20 20 18   Temp: 98.2 F (36.8 C) 98.2 F (36.8 C) 97.7 F (36.5 C) 98 F (36.7 C)  TempSrc: Oral Oral Oral Oral  SpO2: 100% 96% 94% 98%  Weight:      Height:       General.  Well-developed, hard of hearing elderly man, in no acute distress. Pulmonary.  Lungs clear bilaterally, normal respiratory effort. CV.  Regular rate and rhythm, no JVD, rub or murmur. Abdomen.  Soft, nontender, nondistended, BS positive. CNS.  Alert and oriented .  No focal neurologic deficit. Extremities.  Left TMA and foot with Ace wrap Psychiatry.  Judgment and insight appears normal.     Data Reviewed: Prior data reviewed  Family Communication: Discussed with patient  Disposition: Status is: Inpatient Remains inpatient appropriate because: Severity of illness  Planned Discharge Destination: To be determined after amputation  Time spent: 42 minutes  This record has been created using Conservation officer, historic buildings. Errors have been sought and corrected,but may not always be located. Such creation errors do not reflect on the standard of care.   Author: Amaryllis Dare, MD 05/15/2023 3:28 PM  For on call review [email or web address].

## 2023-05-15 NOTE — Progress Notes (Signed)
 Date of Admission:  05/10/2023   T   ID: Andrew Harding is a 73 y.o. male  Active Problems:   Type 2 diabetes mellitus with stage 3b chronic kidney disease, with long-term current use of insulin  (HCC)   Benign essential hypertension   CKD (chronic kidney disease) stage 3, GFR 30-59 ml/min (HCC)   Coronary artery disease   Mixed hyperlipidemia   Atrial fibrillation (HCC)   Diabetic foot infection with possible osteomyelitis, left  (HCC)   Sepsis (HCC)   Open wound of left foot with complication   Gram-negative bacteremia    Subjective: Patient says he is doing okay Is prepared for to have the BKA tomorrow  Medications:   insulin  aspart  0-15 Units Subcutaneous TID WC   insulin  aspart  0-5 Units Subcutaneous QHS   insulin  glargine-yfgn  22 Units Subcutaneous Daily    Objective: Vital signs in last 24 hours: Patient Vitals for the past 24 hrs:  BP Temp Temp src Pulse Resp SpO2  05/15/23 1622 124/78 98.4 F (36.9 C) Oral 81 16 98 %  05/15/23 0752 122/79 98 F (36.7 C) Oral 80 18 98 %  05/15/23 0458 125/81 97.7 F (36.5 C) Oral 76 20 94 %  05/14/23 2033 117/69 98.2 F (36.8 C) Oral 65 20 96 %  05/14/23 2029 117/69 98.2 F (36.8 C) Oral 83 20 100 %      PHYSICAL EXAM:  General: Alert, cooperative, no distress, appears stated age.  Lungs: Clear to auscultation bilaterally. No Wheezing or Rhonchi. No rales. Heart: irregular Abdomen: Soft, non-tender,not distended. Bowel sounds normal. No masses Extremities:       Skin: No rashes or lesions. Or bruising Lymph: Cervical, supraclavicular normal. Neurologic: Grossly non-focal  Lab Results    Latest Ref Rng & Units 05/12/2023    4:49 AM 05/11/2023    5:01 AM 05/10/2023    9:39 AM  CBC  WBC 4.0 - 10.5 K/uL 4.6  6.4  10.8   Hemoglobin 13.0 - 17.0 g/dL 89.3  89.2  88.5   Hematocrit 39.0 - 52.0 % 31.1  31.5  34.6   Platelets 150 - 400 K/uL 228  227  293        Latest Ref Rng & Units 05/12/2023    4:49 AM 05/11/2023     5:01 AM 05/10/2023    9:39 AM  CMP  Glucose 70 - 99 mg/dL 851  846  757   BUN 8 - 23 mg/dL 15  14  15    Creatinine 0.61 - 1.24 mg/dL 8.78  8.69  8.57   Sodium 135 - 145 mmol/L 135  132  127   Potassium 3.5 - 5.1 mmol/L 3.6  3.6  4.0   Chloride 98 - 111 mmol/L 105  100  96   CO2 22 - 32 mmol/L 19  21  18    Calcium  8.9 - 10.3 mg/dL 8.5  8.4  8.6   Total Protein 6.5 - 8.1 g/dL  6.9  7.6   Total Bilirubin 0.0 - 1.2 mg/dL  0.8  0.6   Alkaline Phos 38 - 126 U/L  52  58   AST 15 - 41 U/L  26  28   ALT 0 - 44 U/L  19  20       Microbiology:  Studies/Results: No results found.    Assessment/Plan: Morganella bacteremia on cefepime - will treat with total of 7-10 days Repeat culture negative so far  Diabetic foot infection Left  foot with ulcer on the lateral margin TMA of the left foot Has neuropathy and PAD Recent culture was enterobacter, klebsiella and MSSA  Pt on his feet at work every day and does not off load pressure off that foot  On cefepime  MRI of the foot shows osteo of 5th metatarsal  It is a difficult situation where just antibiotics alone wont suffice-   Pt is going for BKA tomorrow CKD-   CAD s/p CABG   Afib on eliquis  and metoprolol  Discussed the management with the patient

## 2023-05-15 NOTE — Progress Notes (Signed)
 Physical Therapy Treatment Patient Details Name: Andrew Harding MRN: [id number] DOB: 1950/07/19 Today's Date: 05/15/2023   History of Present Illness Andrew Harding is a 73 y.o. male with medical history significant of insulin -dependent DM 2, A-fib on Eliquis , HTN, CAD status post CABG, CKD 3B, PAD s/p transmetatarsal amputation left foot presenting w/ sepsis, suspected LLE cellulitis vs. Osteomyelitis. Pt reports worsening pain over L foot for multiple weeks.  Noted to have been admitted 03/2023 for issues including MSSA bacteremia, diabetic foot infection.Has had outpt follow up w/ ID and podiatry since discharge.  Admitted for sepsis. MRI of left foot with concern of acute osteomyelitis involving the remaining first metatarsal, base of fifth metatarsal, also involving the remaining proximal shafts of second and third metatarsal stem. Also recently seen by ID via Dr. Fayette 05/08/2022 with cultures showing enterobacter, klebsiella and MSSA in the recent culture taken from the lateral wound. As of 1/13 pt now agreeable to limb salvage amputation, planning to go on 05/16/23.    PT Comments  Pt in bed on entry agreeable to session. We spend time practicing RLE NWB mobility techniques that will be required after his pending amputation site. Pt able to demonstrate low STS transfers with RLE NWM, hop gait with RW x70ft. Pt able to perform 6 stairs with RW backwards, maintain NWB RLE, but is fatigued with this and somewhat limited by arm strength. Pt scheduled to go from amputation on 1/15, will need a new PT order once pt is ready to begin rehabilitation.    If plan is discharge home, recommend the following: Other (comment)   Can travel by private vehicle     No  Equipment Recommendations  Other (comment)    Recommendations for Other Services       Precautions / Restrictions Precautions Precautions: Fall Required Braces or Orthoses: Other Brace Other Brace: has an ortho shoe for left foot, but no  current order to use it Restrictions Weight Bearing Restrictions Per Provider Order: No     Mobility  Bed Mobility Overal bed mobility: Modified Independent                  Transfers Overall transfer level: Needs assistance Equipment used: Rolling walker (2 wheels) Transfers: Sit to/from Stand Sit to Stand: Contact guard assist           General transfer comment: practicing RLE NWB for postop education (3x from EOB, heavy effort, mild unsteadiness)    Ambulation/Gait Ambulation/Gait assistance: Modified independent (Device/Increase time) Gait Distance (Feet): 280 Feet Assistive device: None Gait Pattern/deviations: WFL(Within Functional Limits)     Pre-gait activities: AMB NWB RLE c BRW 1x15ft (practice for postop mobility needs)     Stairs Stairs: Yes   Stair Management: Backwards, With walker Number of Stairs: 6 (3 up, 3 down) General stair comments: practicing c RLE NWB for postop education; Also AMB 12 stairs to get to platform for training   Wheelchair Mobility     Tilt Bed    Modified Rankin (Stroke Patients Only)       Balance                                            Cognition Arousal: Alert Behavior During Therapy: WFL for tasks assessed/performed Overall Cognitive Status: Within Functional Limits for tasks assessed  Exercises      General Comments        Pertinent Vitals/Pain Pain Assessment Pain Assessment: No/denies pain    Home Living                          Prior Function            PT Goals (current goals can now be found in the care plan section) Acute Rehab PT Goals Patient Stated Goal: prepare for post amputation mobility needs Time For Goal Achievement: 05/26/23 Potential to Achieve Goals: Good Progress towards PT goals: Progressing toward goals    Frequency    Min 1X/week      PT Plan      Co-evaluation               AM-PAC PT 6 Clicks Mobility   Outcome Measure  Help needed turning from your back to your side while in a flat bed without using bedrails?: None Help needed moving from lying on your back to sitting on the side of a flat bed without using bedrails?: None Help needed moving to and from a bed to a chair (including a wheelchair)?: None Help needed standing up from a chair using your arms (e.g., wheelchair or bedside chair)?: None Help needed to walk in hospital room?: None Help needed climbing 3-5 steps with a railing? : A Little 6 Click Score: 23    End of Session   Activity Tolerance: Patient tolerated treatment well;Patient limited by fatigue Patient left: in bed;with bed alarm set Nurse Communication: Mobility status PT Visit Diagnosis: Unsteadiness on feet (R26.81);Difficulty in walking, not elsewhere classified (R26.2)     Time: 8670-8650 PT Time Calculation (min) (ACUTE ONLY): 20 min  Charges:    $Gait Training: 8-22 mins PT General Charges $$ ACUTE PT VISIT: 1 Visit                    3:07 PM, 05/15/23 Peggye JAYSON Linear, PT, DPT Physical Therapist - [facility]  [phone] (ASCOM)     Rainn Zupko C 05/15/2023, 3:03 PM

## 2023-05-15 NOTE — Progress Notes (Signed)
  Progress Note    05/15/2023 10:35 AM 4 Days Post-Op  Subjective:   Andrew Harding is a 73 year old male who is now postop day 3 from left lower extremity angiogram with angioplasty to tibial artery. Patient is recovering as expected.  Patient was seen by podiatry over the weekend and deemed to need left lower extremity below the knee amputation due to extensive osteomyelitis.  In a detailed discussion with the patient this morning patient agrees this time to have a left lower extremity below the knee amputation.  He is willing to proceed.  No other complaints and vitals all remained stable.    Vitals:   05/15/23 0458 05/15/23 0752  BP: 125/81 122/79  Pulse: 76 80  Resp: 20 18  Temp: 97.7 F (36.5 C) 98 F (36.7 C)  SpO2: 94% 98%   Physical Exam: Cardiac:  RRR Lungs: Clear throughout on auscultation, no rales rhonchi or wheezing Incisions: Left foot debridement by podiatry with dressing clean dry and intact.  Right groin puncture site with dressing clean dry and intact from angiogram. Extremities: Left foot osteomyelitis will need left lower below the knee amputation. Abdomen: Soft, nontender nondistended positive bowel sounds Neurologic: A&OX3  CBC    Component Value Date/Time   WBC 4.6 05/12/2023 0449   RBC 3.72 (L) 05/12/2023 0449   HGB 10.6 (L) 05/12/2023 0449   HCT 31.1 (L) 05/12/2023 0449   PLT 228 05/12/2023 0449   MCV 83.6 05/12/2023 0449   MCH 28.5 05/12/2023 0449   MCHC 34.1 05/12/2023 0449   RDW 13.7 05/12/2023 0449   LYMPHSABS 0.4 (L) 05/10/2023 0939   MONOABS 0.4 05/10/2023 0939   EOSABS 0.0 05/10/2023 0939   BASOSABS 0.0 05/10/2023 0939    BMET    Component Value Date/Time   NA 135 05/12/2023 0449   K 3.6 05/12/2023 0449   CL 105 05/12/2023 0449   CO2 19 (L) 05/12/2023 0449   GLUCOSE 148 (H) 05/12/2023 0449   GLUCOSE 402 (H) 09/17/2012 1417   BUN 15 05/12/2023 0449   CREATININE 1.21 05/12/2023 0449   CALCIUM  8.5 (L) 05/12/2023 0449   GFRNONAA >60  05/12/2023 0449   GFRAA 58 (L) 06/02/2015 0656    INR    Component Value Date/Time   INR 1.3 (H) 05/10/2023 0939     Intake/Output Summary (Last 24 hours) at 05/15/2023 1035 Last data filed at 05/15/2023 9047 Gross per 24 hour  Intake 120 ml  Output --  Net 120 ml     Assessment/Plan:  73 y.o. male is s/p left lower extremity angiogram  4 Days Post-Op   PLAN: Vascular surgery plans on taking the patient to the operating room for left lower extremity below the knee amputation tomorrow on 05/16/2023   I had a long detailed discussion with the patient this morning regarding left lower below the knee amputation procedure, benefits, risks and complications.  Patient verbalizes understanding and wishes to proceed.  I answered all the patient's questions this morning.  Patient will be made n.p.o. at midnight the day before surgery.  Plan is to take the patient to surgery on Wednesday, 05/16/2023.   DVT prophylaxis: Lovenox  100 mg every 12 hours   [name] 05/15/2023 10:35 AM

## 2023-05-16 ENCOUNTER — Encounter
Admission: EM | Disposition: A | Discharge status: Skilled Nursing Facility | Payer: Self-pay | Source: Home / Self Care | Attending: Internal Medicine

## 2023-05-16 ENCOUNTER — Other Ambulatory Visit: Payer: Self-pay

## 2023-05-16 ENCOUNTER — Encounter: Payer: Self-pay | Admitting: Family Medicine

## 2023-05-16 ENCOUNTER — Inpatient Hospital Stay: Payer: Managed Care, Other (non HMO)

## 2023-05-16 ENCOUNTER — Inpatient Hospital Stay: Payer: Managed Care, Other (non HMO) | Admitting: Anesthesiology

## 2023-05-16 DIAGNOSIS — Z9889 Other specified postprocedural states: Secondary | ICD-10-CM | POA: Diagnosis not present

## 2023-05-16 DIAGNOSIS — Z89432 Acquired absence of left foot: Secondary | ICD-10-CM

## 2023-05-16 DIAGNOSIS — M869 Osteomyelitis, unspecified: Secondary | ICD-10-CM

## 2023-05-16 DIAGNOSIS — M86172 Other acute osteomyelitis, left ankle and foot: Secondary | ICD-10-CM | POA: Diagnosis not present

## 2023-05-16 HISTORY — PX: AMPUTATION: SHX166

## 2023-05-16 LAB — CULTURE, BLOOD (ROUTINE X 2)
Culture: NO GROWTH
Culture: NO GROWTH

## 2023-05-16 LAB — ABO/RH: ABO/RH(D): AB POS

## 2023-05-16 LAB — TYPE AND SCREEN
ABO/RH(D): AB POS
Antibody Screen: NEGATIVE

## 2023-05-16 LAB — GLUCOSE, CAPILLARY
Glucose-Capillary: 136 mg/dL — ABNORMAL HIGH (ref 70–99)
Glucose-Capillary: 138 mg/dL — ABNORMAL HIGH (ref 70–99)
Glucose-Capillary: 183 mg/dL — ABNORMAL HIGH (ref 70–99)
Glucose-Capillary: 195 mg/dL — ABNORMAL HIGH (ref 70–99)

## 2023-05-16 SURGERY — AMPUTATION BELOW KNEE
Anesthesia: Regional | Site: Knee | Laterality: Left

## 2023-05-16 MED ORDER — BUPIVACAINE HCL (PF) 0.5 % IJ SOLN
INTRAMUSCULAR | Status: AC
  Filled 2023-05-16: qty 20

## 2023-05-16 MED ORDER — BUPIVACAINE LIPOSOME 1.3 % IJ SUSP
INTRAMUSCULAR | Status: AC
  Filled 2023-05-16: qty 20

## 2023-05-16 MED ORDER — MIDAZOLAM HCL 2 MG/2ML IJ SOLN
INTRAMUSCULAR | Status: DC | PRN
  Administered 2023-05-16: 2 mg via INTRAVENOUS

## 2023-05-16 MED ORDER — LIDOCAINE HCL (PF) 1 % IJ SOLN
INTRAMUSCULAR | Status: AC
  Filled 2023-05-16: qty 5

## 2023-05-16 MED ORDER — PHENYLEPHRINE 80 MCG/ML (10ML) SYRINGE FOR IV PUSH (FOR BLOOD PRESSURE SUPPORT)
PREFILLED_SYRINGE | INTRAVENOUS | Status: AC
  Filled 2023-05-16: qty 10

## 2023-05-16 MED ORDER — FENTANYL CITRATE PF 50 MCG/ML IJ SOSY
PREFILLED_SYRINGE | INTRAMUSCULAR | Status: AC
  Filled 2023-05-16: qty 1

## 2023-05-16 MED ORDER — CEFAZOLIN SODIUM-DEXTROSE 2-3 GM-%(50ML) IV SOLR
INTRAVENOUS | Status: DC | PRN
  Administered 2023-05-16: 2 g via INTRAVENOUS

## 2023-05-16 MED ORDER — 0.9 % SODIUM CHLORIDE (POUR BTL) OPTIME
TOPICAL | Status: DC | PRN
  Administered 2023-05-16: 1000 mL

## 2023-05-16 MED ORDER — PROPOFOL 1000 MG/100ML IV EMUL
INTRAVENOUS | Status: AC
  Filled 2023-05-16: qty 100

## 2023-05-16 MED ORDER — KETAMINE HCL 50 MG/5ML IJ SOSY
PREFILLED_SYRINGE | INTRAMUSCULAR | Status: DC | PRN
  Administered 2023-05-16: 10 mg via INTRAVENOUS

## 2023-05-16 MED ORDER — PHENYLEPHRINE 80 MCG/ML (10ML) SYRINGE FOR IV PUSH (FOR BLOOD PRESSURE SUPPORT)
PREFILLED_SYRINGE | INTRAVENOUS | Status: DC | PRN
  Administered 2023-05-16: 80 ug via INTRAVENOUS
  Administered 2023-05-16: 120 ug via INTRAVENOUS

## 2023-05-16 MED ORDER — BUPIVACAINE LIPOSOME 1.3 % IJ SUSP
INTRAMUSCULAR | Status: DC | PRN
  Administered 2023-05-16: 20 mL via PERINEURAL

## 2023-05-16 MED ORDER — MIDAZOLAM HCL 2 MG/2ML IJ SOLN
INTRAMUSCULAR | Status: AC
  Filled 2023-05-16: qty 2

## 2023-05-16 MED ORDER — INSULIN GLARGINE-YFGN 100 UNIT/ML ~~LOC~~ SOLN
22.0000 [IU] | Freq: Every day | SUBCUTANEOUS | Status: DC
Start: 2023-05-17 — End: 2023-05-29
  Administered 2023-05-17 – 2023-05-29 (×13): 22 [IU] via SUBCUTANEOUS
  Filled 2023-05-16 (×13): qty 0.22

## 2023-05-16 MED ORDER — FENTANYL CITRATE (PF) 100 MCG/2ML IJ SOLN
INTRAMUSCULAR | Status: DC | PRN
  Administered 2023-05-16: 50 ug via INTRAVENOUS

## 2023-05-16 MED ORDER — BUPIVACAINE HCL (PF) 0.5 % IJ SOLN
INTRAMUSCULAR | Status: DC | PRN
  Administered 2023-05-16: 20 mL via PERINEURAL

## 2023-05-16 MED ORDER — KETAMINE HCL 50 MG/5ML IJ SOSY
PREFILLED_SYRINGE | INTRAMUSCULAR | Status: AC
  Filled 2023-05-16: qty 5

## 2023-05-16 MED ORDER — DEXMEDETOMIDINE HCL IN NACL 80 MCG/20ML IV SOLN
INTRAVENOUS | Status: DC | PRN
  Administered 2023-05-16 (×4): 8 ug via INTRAVENOUS

## 2023-05-16 MED ORDER — PROPOFOL 500 MG/50ML IV EMUL
INTRAVENOUS | Status: DC | PRN
  Administered 2023-05-16: 60 ug/kg/min via INTRAVENOUS

## 2023-05-16 SURGICAL SUPPLY — 34 items
BLADE SAGITTAL WIDE XTHICK NO (BLADE) IMPLANT
BLADE SAW SAG 25.4X90 (BLADE) ×1 IMPLANT
BNDG COHESIVE 4X5 TAN STRL LF (GAUZE/BANDAGES/DRESSINGS) ×1 IMPLANT
BNDG ELASTIC 6X5.8 VLCR NS LF (GAUZE/BANDAGES/DRESSINGS) ×1 IMPLANT
BNDG GAUZE DERMACEA FLUFF 4 (GAUZE/BANDAGES/DRESSINGS) ×2 IMPLANT
BRUSH SCRUB EZ 4% CHG (MISCELLANEOUS) ×1 IMPLANT
CHLORAPREP W/TINT 26 (MISCELLANEOUS) ×1 IMPLANT
DRAPE INCISE IOBAN 66X45 STRL (DRAPES) IMPLANT
ELECT CAUTERY BLADE 6.4 (BLADE) ×1 IMPLANT
ELECT REM PT RETURN 9FT ADLT (ELECTROSURGICAL) ×1
ELECTRODE REM PT RTRN 9FT ADLT (ELECTROSURGICAL) ×1 IMPLANT
GAUZE XEROFORM 1X8 LF (GAUZE/BANDAGES/DRESSINGS) ×2 IMPLANT
GLOVE BIO SURGEON STRL SZ7 (GLOVE) ×2 IMPLANT
GOWN STRL REUS W/ TWL LRG LVL3 (GOWN DISPOSABLE) ×2 IMPLANT
GOWN STRL REUS W/TWL 2XL LVL3 (GOWN DISPOSABLE) ×1 IMPLANT
HANDLE YANKAUER SUCT BULB TIP (MISCELLANEOUS) ×1 IMPLANT
KIT TURNOVER KIT A (KITS) ×1 IMPLANT
LABEL OR SOLS (LABEL) ×1 IMPLANT
MANIFOLD NEPTUNE II (INSTRUMENTS) ×1 IMPLANT
MAT ABSORB FLUID 56X50 GRAY (MISCELLANEOUS) ×1 IMPLANT
NS IRRIG 1000ML POUR BTL (IV SOLUTION) ×1 IMPLANT
PACK EXTREMITY ARMC (MISCELLANEOUS) ×1 IMPLANT
PAD ABD DERMACEA PRESS 5X9 (GAUZE/BANDAGES/DRESSINGS) ×2 IMPLANT
PAD PREP OB/GYN DISP 24X41 (PERSONAL CARE ITEMS) ×1 IMPLANT
SPONGE T-LAP 18X18 ~~LOC~~+RFID (SPONGE) ×1 IMPLANT
STAPLER SKIN PROX 35W (STAPLE) ×1 IMPLANT
STOCKINETTE M/LG 89821 (MISCELLANEOUS) ×1 IMPLANT
SUT SILK 2 0 SH (SUTURE) ×2 IMPLANT
SUT SILK 2-0 18XBRD TIE 12 (SUTURE) ×1 IMPLANT
SUT SILK 3-0 18XBRD TIE 12 (SUTURE) ×1 IMPLANT
SUT VIC AB 0 CT1 36 (SUTURE) ×2 IMPLANT
SUT VIC AB 2-0 CT1 (SUTURE) ×2 IMPLANT
TRAP FLUID SMOKE EVACUATOR (MISCELLANEOUS) ×1 IMPLANT
WATER STERILE IRR 500ML POUR (IV SOLUTION) ×1 IMPLANT

## 2023-05-16 NOTE — Anesthesia Preprocedure Evaluation (Addendum)
 Anesthesia Evaluation  Patient identified by MRN, date of birth, ID band Patient awake    Reviewed: Allergy & Precautions, NPO status , Patient's Chart, lab work & pertinent test results  Airway Mallampati: II  TM Distance: >3 FB Neck ROM: full    Dental  (+) Teeth Intact   Pulmonary former smoker   Pulmonary exam normal breath sounds clear to auscultation       Cardiovascular Exercise Tolerance: Good hypertension, Pt. on medications + CAD and + CABG  + dysrhythmias Atrial Fibrillation  Rhythm:Regular Rate:Normal     Neuro/Psych  Neuromuscular disease  negative psych ROS   GI/Hepatic negative GI ROS, Neg liver ROS,,,  Endo/Other  diabetes, Type 2, Insulin  Dependent    Renal/GU Renal InsufficiencyRenal disease  negative genitourinary   Musculoskeletal negative musculoskeletal ROS (+)    Abdominal Normal abdominal exam  (+)   Peds negative pediatric ROS (+)  Hematology  (+) Blood dyscrasia, anemia   Anesthesia Other Findings - Sepsis (HCC) - Open wound of left foot with complication - Gram-negative bacteremia   Past Medical History: No date: Bladder neck obstruction No date: Chronic kidney disease No date: Coronary artery disease     Comment:  a.) s/p 4v CABG in 2014 No date: Diabetes mellitus without complication (HCC) No date: Diabetic neuropathy (HCC) No date: Diabetic peripheral neuropathy (HCC) No date: Diverticulosis No date: Gout No date: Heart murmur No date: Hypercholesteremia No date: Hyperlipidemia No date: Hypertension No date: Peripheral neuropathy 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 No date: Tubular adenoma No date: Vitamin D  deficiency  Past Surgical History: 08/19/2022: AMPUTATION; Left     Comment:  Procedure: TRANSMETATARSAL AMPUTATION LEFT FOOT WITH               IRRIGATION AND DEBRIDEMENT;  Surgeon: Pink Bridges, DPM;              Location: [facility];  Service: Podiatry;  Laterality:                Left; 06/01/2015: AMPUTATION TOE; Right     Comment:  Procedure: AMPUTATION TOE;  Surgeon: Sharlyn Deaner,               DPM;  Location: [facility];  Service: Podiatry;                Laterality: Right; 08/11/2022: AMPUTATION TOE; Left     Comment:  Procedure: AMPUTATION TOE 2, 3, 4;  Surgeon: Anell Baptist, DPM;  Location: [facility];  Service: Podiatry;                Laterality: Left; No date: CATARACT EXTRACTION, BILATERAL 06/12/2022: CIRCUMCISION; N/A     Comment:  Procedure: CIRCUMCISION ADULT;  Surgeon: Dustin Gimenez, MD;  Location: [facility];  Service: Urology;                Laterality: N/A; 02/14/2016: COLONOSCOPY WITH PROPOFOL ; N/A     Comment:  Procedure: COLONOSCOPY WITH PROPOFOL ;  Surgeon: Deveron Fly, MD;  Location: [facility] ENDOSCOPY;  Service:               Endoscopy;  Laterality: N/A; 01/07/2019: COLONOSCOPY WITH PROPOFOL ; N/A     Comment:  Procedure: COLONOSCOPY WITH PROPOFOL ;  Surgeon:  Deveron Fly, MD;  Location: [facility] ENDOSCOPY;                Service: Endoscopy;  Laterality: N/A; 08/2012: CORONARY ARTERY BYPASS GRAFT; N/A 06/01/2015: EXCISION PARTIAL PHALANX; Right     Comment:  Procedure: EXCISION PARTIAL PHALANX /  BONE;  Surgeon:               Sharlyn Deaner, DPM;  Location: [facility];  Service:               Podiatry;  Laterality: Right; 05/29/2016: FLEXIBLE SIGMOIDOSCOPY; N/A     Comment:  Procedure: FLEXIBLE SIGMOIDOSCOPY;  Surgeon: Deveron Fly, MD;  Location: [facility] ENDOSCOPY;  Service:               Endoscopy;  Laterality: N/A; 08/23/2022: INCISION AND DRAINAGE; Left     Comment:  Procedure: INCISION AND DRAINAGE;  Surgeon: Anell Baptist, DPM;  Location: [facility];  Service: Podiatry;                Laterality: Left; 09/15/2022: INCISION AND DRAINAGE OF WOUND; Left     Comment:  Procedure: 11044 - DEBRIDE BONE and EXCISION IF 1ST               METATARSAL  BONE WITH  DELAY PRIMARY CLOSURE;  Surgeon:               Anell Baptist, DPM;  Location: [facility];  Service:               Podiatry;  Laterality: Left; 02/28/2023: IRRIGATION AND DEBRIDEMENT FOOT; Left     Comment:  Procedure: IRRIGATION AND DEBRIDEMENT FOOT;  Surgeon:               Anell Baptist, DPM;  Location: [facility];  Service:               Orthopedics/Podiatry;  Laterality: Left; No date: KNEE ARTHROSCOPY; Left 08/22/2022: LOWER EXTREMITY ANGIOGRAPHY; Left     Comment:  Procedure: Lower Extremity Angiography;  Surgeon:               Jackquelyn Mass, MD;  Location: [facility];               Service: Cardiovascular;  Laterality: Left; 09/15/2022: WOUND DEBRIDEMENT; Left     Comment:  Procedure: 11043 - DEBRIDE SKIN. MUSCLE FASCIA;                Surgeon: Anell Baptist, DPM;  Location: [facility];                Service: Podiatry;  Laterality: Left;  BMI    Body Mass Index: 26.73 kg/m      Reproductive/Obstetrics negative OB ROS                              Anesthesia Physical Anesthesia Plan  ASA: 3  Anesthesia Plan: General   Post-op Pain Management:    Induction: Intravenous  PONV Risk Score and Plan: Propofol  infusion and TIVA  Airway Management Planned: Natural Airway and Nasal Cannula  Additional Equipment:   Intra-op Plan:   Post-operative Plan:   Informed Consent: I have reviewed the patients History and Physical, chart, labs and discussed the procedure including  the risks, benefits and alternatives for the proposed anesthesia with the patient or authorized representative who has indicated his/her understanding and acceptance.     Dental Advisory Given  Plan Discussed with: CRNA and Surgeon  Anesthesia Plan Comments:         Anesthesia Quick Evaluation

## 2023-05-16 NOTE — Anesthesia Procedure Notes (Signed)
 Anesthesia Regional Block: Adductor canal block   Pre-Anesthetic Checklist: , timeout performed,  Correct Patient, Correct Site, Correct Laterality,  Correct Procedure, Correct Position, site marked,  Risks and benefits discussed,  Surgical consent,  Pre-op evaluation,  At surgeon's request and post-op pain management  Laterality: Left  Prep: chloraprep       Needles:  Injection technique: Single-shot  Needle Type: Stimiplex     Needle Length: 9cm  Needle Gauge: 22     Additional Needles:   Procedures:,,,, ultrasound used (permanent image in chart),,    Narrative:  Start time: 05/16/2023 12:56 PM End time: 05/16/2023 12:58 PM Injection made incrementally with aspirations every 5 mL.  Performed by: Personally  Anesthesiologist: Baltazar Bonier, MD  Additional Notes: Patient consented for risk and benefits of nerve block including but not limited to nerve damage, failed block, bleeding and infection.  Patient voiced understanding.  Functioning IV was confirmed and monitors were applied.  Timeout done prior to procedure and prior to any sedation being given to the patient.  Patient confirmed procedure site prior to any sedation given to the patient. Sterile prep,hand hygiene and sterile gloves were used.  Minimal sedation used for procedure.  No paresthesia endorsed by patient during the procedure.  Negative aspiration and negative test dose prior to incremental administration of local anesthetic. The patient tolerated the procedure well with no immediate complications.

## 2023-05-16 NOTE — Transfer of Care (Signed)
 Immediate Anesthesia Transfer of Care Note  [name]  Procedure(s) Performed: AMPUTATION BELOW KNEE (Left: Knee)  Patient Location: PACU  Anesthesia Type:General and Regional  Level of Consciousness: drowsy  Airway & Oxygen Therapy: Patient Spontanous Breathing and Patient connected to face mask oxygen  Post-op Assessment: Report given to RN, Post -op Vital signs reviewed and stable, and Patient moving all extremities  Post vital signs: Reviewed and stable  Last Vitals:  Vitals Value Taken Time  BP 145/80 05/16/23 1439  Temp    Pulse 79 05/16/23 1441  Resp 21 05/16/23 1441  SpO2 100 % 05/16/23 1441  Vitals shown include unfiled device data.  Last Pain:  Vitals:   05/16/23 1103  TempSrc: Temporal  PainSc: 0-No pain      Patients Stated Pain Goal: 0 (05/11/23 0459)  Complications: No notable events documented.

## 2023-05-16 NOTE — Anesthesia Procedure Notes (Signed)
 Procedure Name: MAC Date/Time: 05/16/2023 1:10 PM  Performed by: Bobette Burrs, CRNAPre-anesthesia Checklist: Patient identified, Emergency Drugs available, Suction available and Patient being monitored Patient Re-evaluated:Patient Re-evaluated prior to induction Oxygen Delivery Method: Simple face mask

## 2023-05-16 NOTE — Progress Notes (Signed)
 Progress Note    Andrew Harding  ZOX:[id number] DOB: 1950-09-28  DOA: 05/10/2023 PCP: Melchor Spoon, MD      Brief Narrative:    Medical records reviewed and are as summarized below:  Andrew Harding is a 73 y.o. male with medical history significant of insulin -dependent DM 2, A-fib on Eliquis , HTN, CAD status post CABG, CKD 3B, PAD s/p transmetatarsal amputation left foot presenting w/ sepsis, suspected LLE cellulitis vs. Osteomyelitis. Pt reports worsening pain over L foot for multiple weeks.  Noted to have been admitted 03/2023 for issues including MSSA bacteremia, diabetic foot infection.Has had outpt follow up w/ ID and podiatry since discharge.    Noted recent evaluation w/ ID  cultures showing enterobacter, klebsiella and MSSA in the recent culture taken from the lateral wound- transitioned from cefadroxil  to cipro . No reported medical noncompliance. Had follow up with podiatry 05/03/2023 w/ plan for MRI and vascular referral.    On presentation patient was febrile at 100.5, heart rate 100s, WBC 10.8, sodium 127, creatinine 1.42, glucose 242, lactic acid 2.3>>1.1.  DG left foot with soft tissue swelling.  Chest x-ray normal.  Procalcitonin 0.48. Vascular surgery and podiatry was consulted. Started on broad-spectrum antibiotics. MRI of the left foot was concerning for osteomyelitis.      Assessment/Plan:   Active Problems:   Sepsis (HCC)   Diabetic foot infection with possible osteomyelitis, left  (HCC)   Atrial fibrillation (HCC)   Benign essential hypertension   CKD (chronic kidney disease) stage 3, GFR 30-59 ml/min (HCC)   Coronary artery disease   Mixed hyperlipidemia   Type 2 diabetes mellitus with stage 3b chronic kidney disease, with long-term current use of insulin  (HCC)   Open wound of left foot with complication   Gram-negative bacteremia    Body mass index is 27.83 kg/m.    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  Follow-up with vascular surgeon. Continue IV cefepime  and Flagyl .     Atrial fibrillation (HCC) Rate controlled. Eliquis  and metoprolol  on hold.     Type 2 diabetes mellitus with hyperglycemia Hold Semglee  today because of upcoming surgery. NovoLog  as directed for hyperglycemia.     Comorbidities include CAD s/p CABG, hypertension, mixed hyperlipidemia, CKD stage IIIa.           Diet Order             Diet NPO time specified Except for: Sips with Meds  Diet effective midnight                            Consultants: ID specialist Vascular surgeon  Procedures: Aortogram and selective lower extremity angiogram, percutaneous transluminal angioplasty of left anterior tibial artery on 05/14/2023    Medications:    [MAR Hold] insulin  aspart  0-15 Units Subcutaneous TID WC   [MAR Hold] insulin  aspart  0-5 Units Subcutaneous QHS   [MAR Hold] insulin  glargine-yfgn  22 Units Subcutaneous Daily   Continuous Infusions:  sodium chloride  75 mL/hr at 05/15/23 2236   [facility] Hold] ceFEPime  (MAXIPIME ) IV 2 g (05/16/23 0642)   [MAR Hold] metronidazole  500 mg (05/16/23 0855)     Anti-infectives (From admission, onward)    Start     Dose/Rate Route Frequency Ordered Stop   05/16/23  1030  ceFAZolin  (ANCEF ) IVPB 2g/100 mL premix        2 g 200 mL/hr over 30 Minutes Intravenous 30 min pre-op 05/15/23 2150 05/16/23 1115   05/12/23 1400  [MAR Hold]  ceFEPIme  (MAXIPIME ) 2 g in sodium chloride  0.9 % 100 mL IVPB        (MAR Hold since Wed 05/16/2023 at 1101.Hold Reason: Transfer to a Procedural area)   2 g 200 mL/hr over 30 Minutes Intravenous Every 8 hours 05/12/23 1318     05/11/23 1300  vancomycin  (VANCOREADY) IVPB 1750 mg/350 mL  Status:  Discontinued        1,750 mg 175 mL/hr over 120  Minutes Intravenous Every 24 hours 05/10/23 1208 05/11/23 1537   05/11/23 1231  ceFAZolin  (ANCEF ) IVPB 2g/100 mL premix  Status:  Discontinued        2 g 200 mL/hr over 30 Minutes Intravenous 30 min pre-op 05/11/23 1231 05/11/23 1423   05/10/23 2300  ceFEPIme  (MAXIPIME ) 2 g in sodium chloride  0.9 % 100 mL IVPB  Status:  Discontinued        2 g 200 mL/hr over 30 Minutes Intravenous Every 12 hours 05/10/23 1208 05/12/23 1318   05/10/23 2000  [MAR Hold]  metroNIDAZOLE  (FLAGYL ) IVPB 500 mg        (MAR Hold since Wed 05/16/2023 at 1101.Hold Reason: Transfer to a Procedural area)   500 mg 100 mL/hr over 60 Minutes Intravenous Every 12 hours 05/10/23 1145 05/17/23 1959   05/10/23 1100  ceFEPIme  (MAXIPIME ) 2 g in sodium chloride  0.9 % 100 mL IVPB        2 g 200 mL/hr over 30 Minutes Intravenous  Once 05/10/23 1050 05/10/23 1141   05/10/23 1100  vancomycin  (VANCOREADY) IVPB 2000 mg/400 mL        2,000 mg 200 mL/hr over 120 Minutes Intravenous  Once 05/10/23 1050 05/10/23 1528              Family Communication/Anticipated D/C date and plan/Code Status   DVT prophylaxis:      Code Status: Full Code  Family Communication: None Disposition Plan: Plan to discharge to home versus SNF   Status is: Inpatient Remains inpatient appropriate because: Left foot infection       Subjective:   Interval events noted.  No new complaints.  He still has some pain in the left foot.  No shortness of breath or chest pain.  He had some visitors at the bedside including a sheriff.  They were politely asked to excuse us .   Objective:    Vitals:   05/15/23 1858 05/16/23 0211 05/16/23 0756 05/16/23 1103  BP: 125/80 127/85 134/80 (!) 157/93  Pulse: 79 88 82 75  Resp: 16 16  15   Temp: 98 F (36.7 C) 98 F (36.7 C) 97.9 F (36.6 C) (!) 97.5 F (36.4 C)  TempSrc:  Oral  Temporal  SpO2: 98% 97% 100% 99%  Weight:    101 kg  Height:    6\' 3"  (1.905 m)   No data found.   Intake/Output  Summary (Last 24 hours) at 05/16/2023 1129 Last data filed at 05/15/2023 1300 Gross per 24 hour  Intake 240 ml  Output --  Net 240 ml   Filed Weights   05/10/23 0931 05/16/23 1103  Weight: 101 kg 101 kg    Exam:  GEN: NAD SKIN: Warm and dry EYES: No pallor or icterus ENT: MMM CV: RRR PULM: CTA B ABD: soft, ND, NT, +BS  CNS: AAO x 3, non focal EXT: Dressing on left foot is clean, dry and intact        Data Reviewed:   I have personally reviewed following labs and imaging studies:  Labs: Labs show the following:   Basic Metabolic Panel: Recent Labs  Lab 05/10/23 0939 05/11/23 0501 05/12/23 0449  NA 127* 132* 135  K 4.0 3.6 3.6  CL 96* 100 105  CO2 18* 21* 19*  GLUCOSE 242* 153* 148*  BUN 15 14 15   CREATININE 1.42* 1.30* 1.21  CALCIUM 8.6* 8.4* 8.5*   GFR Estimated Creatinine Clearance: 65 mL/min (by C-G formula based on SCr of 1.21 mg/dL). Liver Function Tests: Recent Labs  Lab 05/10/23 0939 05/11/23 0501  AST 28 26  ALT 20 19  ALKPHOS 58 52  BILITOT 0.6 0.8  PROT 7.6 6.9  ALBUMIN 3.6 3.1*   No results for input(s): "LIPASE", "AMYLASE" in the last 168 hours. No results for input(s): "AMMONIA" in the last 168 hours. Coagulation profile Recent Labs  Lab 05/10/23 0939  INR 1.3*    CBC: Recent Labs  Lab 05/10/23 0939 05/11/23 0501 05/12/23 0449  WBC 10.8* 6.4 4.6  NEUTROABS 10.0*  --   --   HGB 11.4* 10.7* 10.6*  HCT 34.6* 31.5* 31.1*  MCV 86.1 84.9 83.6  PLT 293 227 228   Cardiac Enzymes: No results for input(s): "CKTOTAL", "CKMB", "CKMBINDEX", "TROPONINI" in the last 168 hours. BNP (last 3 results) No results for input(s): "PROBNP" in the last 8760 hours. CBG: Recent Labs  Lab 05/15/23 1123 05/15/23 1606 05/15/23 2109 05/16/23 0758 05/16/23 1108  GLUCAP 165* 129* 132* 136* 138*   D-Dimer: No results for input(s): "DDIMER" in the last 72 hours. Hgb A1c: No results for input(s): "HGBA1C" in the last 72 hours. Lipid  Profile: No results for input(s): "CHOL", "HDL", "LDLCALC", "TRIG", "CHOLHDL", "LDLDIRECT" in the last 72 hours. Thyroid function studies: No results for input(s): "TSH", "T4TOTAL", "T3FREE", "THYROIDAB" in the last 72 hours.  Invalid input(s): "FREET3" Anemia work up: No results for input(s): "VITAMINB12", "FOLATE", "FERRITIN", "TIBC", "IRON", "RETICCTPCT" in the last 72 hours. Sepsis Labs: Recent Labs  Lab 05/10/23 9562 05/10/23 1149 05/11/23 0501 05/12/23 0449  PROCALCITON 0.43  --   --   --   WBC 10.8*  --  6.4 4.6  LATICACIDVEN 2.3* 1.1  --   --     Microbiology Recent Results (from the past 240 hours)  Culture, blood (Routine x 2)     Status: Abnormal   Collection Time: 05/10/23  9:38 AM   Specimen: BLOOD  Result Value Ref Range Status   Specimen Description   Final    BLOOD RIGHT ANTECUBITAL Performed at [facility], [address] [place], [place] [address]    Special Requests   Final    BOTTLES DRAWN AEROBIC AND ANAEROBIC Blood Culture results may not be optimal due to an inadequate volume of blood received in culture bottles Performed at [facility], [address], [place], [place] [address]    Culture  Setup Time   Final    GRAM NEGATIVE RODS IN BOTH AEROBIC AND ANAEROBIC BOTTLES CRITICAL RESULT CALLED TO, READ BACK BY AND VERIFIED WITH: NATHAN BELEUE @0252  ON 05/11/23 SKL Performed at [facility], [address], [place], [place] [address]    Culture [facility] MORGANII (A)  Final   Report Status 05/13/2023 FINAL  Final   Organism ID, Bacteria MORGANELLA MORGANII  Final  Susceptibility   Morganella morganii - MIC*    AMPICILLIN >=32 RESISTANT Resistant     CEFTAZIDIME <=1 SENSITIVE Sensitive     CIPROFLOXACIN  <=0.25 SENSITIVE Sensitive     GENTAMICIN  <=1 SENSITIVE Sensitive     IMIPENEM 4 SENSITIVE Sensitive     TRIMETH/SULFA <=20 SENSITIVE Sensitive     AMPICILLIN/SULBACTAM 16 INTERMEDIATE Intermediate     PIP/TAZO <=4  SENSITIVE Sensitive ug/mL    * MORGANELLA MORGANII  Blood Culture ID Panel (Reflexed)     Status: Abnormal   Collection Time: 05/10/23  9:38 AM  Result Value Ref Range Status   Enterococcus faecalis NOT DETECTED NOT DETECTED Final   Enterococcus Faecium NOT DETECTED NOT DETECTED Final   Listeria monocytogenes NOT DETECTED NOT DETECTED Final   Staphylococcus species NOT DETECTED NOT DETECTED Final   Staphylococcus aureus (BCID) NOT DETECTED NOT DETECTED Final   Staphylococcus epidermidis NOT DETECTED NOT DETECTED Final   Staphylococcus lugdunensis NOT DETECTED NOT DETECTED Final   Streptococcus species NOT DETECTED NOT DETECTED Final   Streptococcus agalactiae NOT DETECTED NOT DETECTED Final   Streptococcus pneumoniae NOT DETECTED NOT DETECTED Final   Streptococcus pyogenes NOT DETECTED NOT DETECTED Final   A.calcoaceticus-baumannii NOT DETECTED NOT DETECTED Final   Bacteroides fragilis NOT DETECTED NOT DETECTED Final   Enterobacterales DETECTED (A) NOT DETECTED Final    Comment: Enterobacterales represent a large order of gram negative bacteria, not a single organism. Refer to culture for further identification. CRITICAL RESULT CALLED TO, READ BACK BY AND VERIFIED WITH: NATHAN BELEUE @0252  ON 05/11/23 SKL    Enterobacter cloacae complex NOT DETECTED NOT DETECTED Final   Escherichia coli NOT DETECTED NOT DETECTED Final   Klebsiella aerogenes NOT DETECTED NOT DETECTED Final   Klebsiella oxytoca NOT DETECTED NOT DETECTED Final   Klebsiella pneumoniae NOT DETECTED NOT DETECTED Final   Proteus species NOT DETECTED NOT DETECTED Final   Salmonella species NOT DETECTED NOT DETECTED Final   Serratia marcescens NOT DETECTED NOT DETECTED Final   Haemophilus influenzae NOT DETECTED NOT DETECTED Final   Neisseria meningitidis NOT DETECTED NOT DETECTED Final   Pseudomonas aeruginosa NOT DETECTED NOT DETECTED Final   Stenotrophomonas maltophilia NOT DETECTED NOT DETECTED Final   Candida albicans  NOT DETECTED NOT DETECTED Final   Candida auris NOT DETECTED NOT DETECTED Final   Candida glabrata NOT DETECTED NOT DETECTED Final   Candida krusei NOT DETECTED NOT DETECTED Final   Candida parapsilosis NOT DETECTED NOT DETECTED Final   Candida tropicalis NOT DETECTED NOT DETECTED Final   Cryptococcus neoformans/gattii NOT DETECTED NOT DETECTED Final   CTX-M ESBL NOT DETECTED NOT DETECTED Final   Carbapenem resistance IMP NOT DETECTED NOT DETECTED Final   Carbapenem resistance KPC NOT DETECTED NOT DETECTED Final   Carbapenem resistance NDM NOT DETECTED NOT DETECTED Final   Carbapenem resist OXA 48 LIKE NOT DETECTED NOT DETECTED Final   Carbapenem resistance VIM NOT DETECTED NOT DETECTED Final    Comment: Performed at [facility], [address] [place], [place] [address]  Culture, blood (Routine x 2)     Status: Abnormal   Collection Time: 05/10/23  9:39 AM   Specimen: BLOOD  Result Value Ref Range Status   Specimen Description   Final    BLOOD LEFT ANTECUBITAL Performed at [facility], [address] [place], [place] [address]    Special Requests   Final    BOTTLES DRAWN AEROBIC AND ANAEROBIC Blood Culture results may not be optimal due to an  inadequate volume of blood received in culture bottles Performed at [facility], [address] [place], [place] [address]    Culture  Setup Time   Final    GRAM NEGATIVE RODS IN BOTH AEROBIC AND ANAEROBIC BOTTLES CRITICAL RESULT CALLED TO, READ BACK BY AND VERIFIED WITH: NATHAN BELUE @ 2956 05/12/23 BGH Performed at [facility], [address] [place], [place] [address]    Culture (A)  Final    MORGANELLA MORGANII SUSCEPTIBILITIES PERFORMED ON PREVIOUS CULTURE WITHIN THE LAST 5 DAYS. Performed at [facility], [address] [place], [place] [address]    Report Status 05/14/2023 FINAL  Final  Resp panel by RT-PCR (RSV, Flu A&B, Covid) Anterior Nasal Swab     Status: None   Collection  Time: 05/10/23 10:21 AM   Specimen: Anterior Nasal Swab  Result Value Ref Range Status   SARS Coronavirus 2 by RT PCR NEGATIVE NEGATIVE Final    Comment: (NOTE) SARS-CoV-2 target nucleic acids are NOT DETECTED.  The SARS-CoV-2 RNA is generally detectable in upper respiratory specimens during the acute phase of infection. The lowest concentration of SARS-CoV-2 viral copies this assay can detect is 138 copies/mL. A negative result does not preclude SARS-Cov-2 infection and should not be used as the sole basis for treatment or other patient management decisions. A negative result may occur with  improper specimen collection/handling, submission of specimen other than nasopharyngeal swab, presence of viral mutation(s) within the areas targeted by this assay, and inadequate number of viral copies(<138 copies/mL). A negative result must be combined with clinical observations, patient history, and epidemiological information. The expected result is Negative.  Fact Sheet for Patients:  BloggerCourse.com  Fact Sheet for Healthcare Providers:  SeriousBroker.it  This test is no t yet approved or cleared by the [place]  FDA and  has been authorized for detection and/or diagnosis of SARS-CoV-2 by FDA under an Emergency Use Authorization (EUA). This EUA will remain  in effect (meaning this test can be used) for the duration of the COVID-19 declaration under Section 564(b)(1) of the Act, 21 U.S.C.section 360bbb-3(b)(1), unless the authorization is terminated  or revoked sooner.       Influenza A by PCR NEGATIVE NEGATIVE Final   Influenza B by PCR NEGATIVE NEGATIVE Final    Comment: (NOTE) The Xpert Xpress SARS-CoV-2/FLU/RSV plus assay is intended as an aid in the diagnosis of influenza from Nasopharyngeal swab specimens and should not be used as a sole basis for treatment. Nasal washings and aspirates are unacceptable for Xpert Xpress  SARS-CoV-2/FLU/RSV testing.  Fact Sheet for Patients: BloggerCourse.com  Fact Sheet for Healthcare Providers: SeriousBroker.it  This test is not yet approved or cleared by the [place]  FDA and has been authorized for detection and/or diagnosis of SARS-CoV-2 by FDA under an Emergency Use Authorization (EUA). This EUA will remain in effect (meaning this test can be used) for the duration of the COVID-19 declaration under Section 564(b)(1) of the Act, 21 U.S.C. section 360bbb-3(b)(1), unless the authorization is terminated or revoked.     Resp Syncytial Virus by PCR NEGATIVE NEGATIVE Final    Comment: (NOTE) Fact Sheet for Patients: BloggerCourse.com  Fact Sheet for Healthcare Providers: SeriousBroker.it  This test is not yet approved or cleared by the [place]  FDA and has been authorized for detection and/or diagnosis of SARS-CoV-2 by FDA under an Emergency Use Authorization (EUA). This EUA will remain in effect (meaning this test can be used) for the duration of the  COVID-19 declaration under Section 564(b)(1) of the Act, 21 U.S.C. section 360bbb-3(b)(1), unless the authorization is terminated or revoked.  Performed at [facility], [address] [place], [place] [address]   MRSA Next Gen by PCR, Nasal     Status: None   Collection Time: 05/11/23  5:01 AM   Specimen: Nasal Mucosa; Nasal Swab  Result Value Ref Range Status   MRSA by PCR Next Gen NOT DETECTED NOT DETECTED Final    Comment: (NOTE) The GeneXpert MRSA Assay (FDA approved for NASAL specimens only), is one component of a comprehensive MRSA colonization surveillance program. It is not intended to diagnose MRSA infection nor to guide or monitor treatment for MRSA infections. Test performance is not FDA approved in patients less than 43 years old. Performed at [facility], [address] [place], [place] [address]   Culture, blood (Routine X 2) w Reflex to ID Panel     Status: None (Preliminary result)   Collection Time: 05/11/23 10:16 AM   Specimen: BLOOD  Result Value Ref Range Status   Specimen Description BLOOD BLOOD LEFT HAND  Final   Special Requests   Final    BOTTLES DRAWN AEROBIC AND ANAEROBIC Blood Culture results may not be optimal due to an inadequate volume of blood received in culture bottles   Culture   Final    NO GROWTH 4 DAYS Performed at [facility], [address], [place], [place] [address]    Report Status PENDING  Incomplete  Culture, blood (Routine X 2) w Reflex to ID Panel     Status: None (Preliminary result)   Collection Time: 05/11/23 10:16 AM   Specimen: BLOOD  Result Value Ref Range Status   Specimen Description BLOOD BLOOD RIGHT HAND  Final   Special Requests   Final    BOTTLES DRAWN AEROBIC AND ANAEROBIC Blood Culture results may not be optimal due to an inadequate volume of blood received in culture bottles   Culture   Final    NO GROWTH 4 DAYS Performed at [facility], [address], [place], [place] [address]    Report Status PENDING  Incomplete    Procedures and diagnostic studies:  US  OR NERVE BLOCK-IMAGE ONLY [facility]) Result Date: 05/16/2023 There is no interpretation for this exam.  This order is for images obtained during a surgical procedure.  Please See "Surgeries" Tab for more information regarding the procedure.               LOS: 6 days   Delonda Coley  [facility]   Pager on [email or web address]. If 7PM-7AM, please contact night-coverage at [email or web address]     05/16/2023, 11:29 AM

## 2023-05-16 NOTE — Interval H&P Note (Signed)
 History and Physical Interval Note:  05/16/2023 1:01 PM  Andrew Harding  has presented today for surgery, with the diagnosis of Osteomyleitis Left Foot.  The various methods of treatment have been discussed with the patient and family. After consideration of risks, benefits and other options for treatment, the patient has consented to  Procedure(s): AMPUTATION BELOW KNEE (Left) as a surgical intervention.  The patient's history has been reviewed, patient examined, no change in status, stable for surgery.  I have reviewed the patient's chart and labs.  Questions were answered to the patient's satisfaction.     [name]

## 2023-05-16 NOTE — Anesthesia Procedure Notes (Signed)
 Anesthesia Regional Block: Popliteal block   Pre-Anesthetic Checklist: , timeout performed,  Correct Patient, Correct Site, Correct Laterality,  Correct Procedure, Correct Position, site marked,  Risks and benefits discussed,  Surgical consent,  Pre-op evaluation,  At surgeon's request and post-op pain management  Laterality: Upper and Left  Prep: chloraprep       Needles:  Injection technique: Single-shot  Needle Type: Stimiplex     Needle Length: 9cm  Needle Gauge: 22     Additional Needles:   Procedures:,,,, ultrasound used (permanent image in chart),,    Narrative:  Start time: 05/16/2023 12:56 PM End time: 05/16/2023 12:58 PM Injection made incrementally with aspirations every 5 mL.  Performed by: Personally  Anesthesiologist: Baltazar Bonier, MD  Additional Notes: Patient consented for risk and benefits of nerve block including but not limited to nerve damage, failed block, bleeding and infection.  Patient voiced understanding.  Functioning IV was confirmed and monitors were applied.  Timeout done prior to procedure and prior to any sedation being given to the patient.  Patient confirmed procedure site prior to any sedation given to the patient. Sterile prep,hand hygiene and sterile gloves were used.  Minimal sedation used for procedure.  No paresthesia endorsed by patient during the procedure.  Negative aspiration and negative test dose prior to incremental administration of local anesthetic. The patient tolerated the procedure well with no immediate complications.

## 2023-05-16 NOTE — Progress Notes (Signed)
 Patient received from PACU alert and verbal, on room air. No pain verbalized. Dressing to left BKA clean dry and intact. Spouse at bedside.   Andrew Harding V [name]

## 2023-05-16 NOTE — Op Note (Signed)
 OPERATIVE NOTE   PROCEDURE: Left below-the-knee amputation  PRE-OPERATIVE DIAGNOSIS: Left foot osteomyelitis s/p left TMA  POST-OPERATIVE DIAGNOSIS: same as above  SURGEON: Mikki Alexander, MD  ASSISTANT(S): Penne Bowl, NP  ANESTHESIA: general  ESTIMATED BLOOD LOSS: 250 cc  FINDING(S): none  SPECIMEN(S):  Left below-the-knee amputation  INDICATIONS:   Andrew Harding is a 73 y.o. male who presents with left foot osteomyelitis status post previous transmetatarsal amputation.  Podiatry is deemed his foot is no longer salvageable.  The patient is scheduled for a left below-the-knee amputation.  I discussed in depth with the patient the risks, benefits, and alternatives to this procedure.  The patient is aware that the risk of this operation included but are not limited to:  bleeding, infection, myocardial infarction, stroke, death, failure to heal amputation wound, and possible need for more proximal amputation.  The patient is aware of the risks and agrees proceed forward with the procedure. An assistant was present during the procedure to help facilitate the exposure and expedite the procedure.   DESCRIPTION:  After full informed written consent was obtained from the patient, the patient was brought back to the operating room, and placed supine upon the operating table.  Prior to induction, the patient received IV antibiotics.  The patient was then prepped and draped in the standard fashion for a below-the-knee amputation.  After obtaining adequate anesthesia, the patient was prepped and draped in the standard fashion for a left below-the-knee amputation. The assistant provided retraction and mobilization to help facilitate exposure and expedite the procedure throughout the entire procedure.  This included following suture, using retractors, and optimizing lighting.  I marked out the anterior incision two finger breadths below the tibial tuberosity and then the marked out a posterior flap that  was one third of the circumference of the calf in length.   I made the incisions for these flaps, and then dissected through the subcutaneous tissue, fascia, and muscle anteriorly.  I elevated  the periosteal tissue superiorly so that the tibia was about 3-4 cm shorter than the anterior skin flap.  I then transected the tibia with a power saw and then took a wedge off the tibia anteriorly with the power saw.  Then I smoothed out the rough edges.  In a similar fashion, I cut back the fibula about two centimeters higher than the level of the tibia with a bone cutter.  I put a bone hook into the distal tibia and then used a large amputation knife to sharply develop a tissue plane through the muscle along the fibula.  In such fashion, the posterior flap was developed.  At this point, the specimen was passed off the field as the below-the-knee amputation.  At this point, I clamped all visibly bleeding arteries and veins using a combination of suture ligation with Silk suture and electrocautery.  Bleeding continued to be controlled with electrocautery and suture ligature.  The stump was washed off with sterile normal saline and no further active bleeding was noted.  I reapproximated the anterior and posterior fascia  with interrupted stitches of 0 Vicryl.  This was completed along the entire length of anterior and posterior fascia until there were no more loose space in the fascial line. I then placed a layer of 2-0 Vicryl sutures in the subcutaneous tissue. The skin was then  reapproximated with staples.  The stump was washed off and dried.  The incision was dressed with Xeroform and  then fluffs were applied.  Kerlix  was wrapped around the leg and then gently an ACE wrap was applied.    COMPLICATIONS: none  CONDITION: stable   Mikki Alexander  05/16/2023, 2:32 PM    This note was created with Dragon Medical transcription system. Any errors in dictation are purely unintentional.

## 2023-05-17 ENCOUNTER — Encounter: Payer: Self-pay | Admitting: Vascular Surgery

## 2023-05-17 DIAGNOSIS — R7881 Bacteremia: Secondary | ICD-10-CM | POA: Diagnosis not present

## 2023-05-17 DIAGNOSIS — M86172 Other acute osteomyelitis, left ankle and foot: Secondary | ICD-10-CM | POA: Diagnosis not present

## 2023-05-17 DIAGNOSIS — B9689 Other specified bacterial agents as the cause of diseases classified elsewhere: Secondary | ICD-10-CM | POA: Diagnosis not present

## 2023-05-17 DIAGNOSIS — S91302A Unspecified open wound, left foot, initial encounter: Secondary | ICD-10-CM | POA: Diagnosis not present

## 2023-05-17 LAB — CBC WITH DIFFERENTIAL/PLATELET
Abs Immature Granulocytes: 0.08 10*3/uL — ABNORMAL HIGH (ref 0.00–0.07)
Basophils Absolute: 0 10*3/uL (ref 0.0–0.1)
Basophils Relative: 0 %
Eosinophils Absolute: 0.1 10*3/uL (ref 0.0–0.5)
Eosinophils Relative: 1 %
HCT: 29.3 % — ABNORMAL LOW (ref 39.0–52.0)
Hemoglobin: 9.7 g/dL — ABNORMAL LOW (ref 13.0–17.0)
Immature Granulocytes: 1 %
Lymphocytes Relative: 9 %
Lymphs Abs: 0.7 10*3/uL (ref 0.7–4.0)
MCH: 28.7 pg (ref 26.0–34.0)
MCHC: 33.1 g/dL (ref 30.0–36.0)
MCV: 86.7 fL (ref 80.0–100.0)
Monocytes Absolute: 0.5 10*3/uL (ref 0.1–1.0)
Monocytes Relative: 5 %
Neutro Abs: 7.1 10*3/uL (ref 1.7–7.7)
Neutrophils Relative %: 84 %
Platelets: 252 10*3/uL (ref 150–400)
RBC: 3.38 MIL/uL — ABNORMAL LOW (ref 4.22–5.81)
RDW: 13.8 % (ref 11.5–15.5)
WBC: 8.5 10*3/uL (ref 4.0–10.5)
nRBC: 0 % (ref 0.0–0.2)

## 2023-05-17 LAB — BASIC METABOLIC PANEL
Anion gap: 8 (ref 5–15)
BUN: 9 mg/dL (ref 8–23)
CO2: 19 mmol/L — ABNORMAL LOW (ref 22–32)
Calcium: 8 mg/dL — ABNORMAL LOW (ref 8.9–10.3)
Chloride: 109 mmol/L (ref 98–111)
Creatinine, Ser: 1.13 mg/dL (ref 0.61–1.24)
GFR, Estimated: >60 mL/min (ref >60)
Glucose, Bld: 172 mg/dL — ABNORMAL HIGH (ref 70–99)
Potassium: 3.4 mmol/L — ABNORMAL LOW (ref 3.5–5.1)
Sodium: 136 mmol/L (ref 135–145)

## 2023-05-17 LAB — GLUCOSE, CAPILLARY
Glucose-Capillary: 162 mg/dL — ABNORMAL HIGH (ref 70–99)
Glucose-Capillary: 163 mg/dL — ABNORMAL HIGH (ref 70–99)
Glucose-Capillary: 211 mg/dL — ABNORMAL HIGH (ref 70–99)
Glucose-Capillary: 283 mg/dL — ABNORMAL HIGH (ref 70–99)
Glucose-Capillary: 196 mg/dL — ABNORMAL HIGH (ref 70–99)

## 2023-05-17 LAB — MAGNESIUM: Magnesium: 1.8 mg/dL (ref 1.7–2.4)

## 2023-05-17 MED ORDER — MORPHINE SULFATE (PF) 4 MG/ML IV SOLN
4.0000 mg | INTRAVENOUS | Status: DC | PRN
  Administered 2023-05-20 – 2023-05-22 (×3): 4 mg via INTRAVENOUS
  Filled 2023-05-17 (×3): qty 1

## 2023-05-17 MED ORDER — METOPROLOL TARTRATE 25 MG PO TABS
25.0000 mg | ORAL_TABLET | Freq: Two times a day (BID) | ORAL | Status: DC
  Administered 2023-05-17 – 2023-05-29 (×24): 25 mg via ORAL
  Filled 2023-05-17 (×24): qty 1

## 2023-05-17 MED ORDER — ACETAMINOPHEN 325 MG PO TABS
650.0000 mg | ORAL_TABLET | Freq: Four times a day (QID) | ORAL | Status: DC | PRN
  Administered 2023-05-20 – 2023-05-21 (×2): 650 mg via ORAL
  Filled 2023-05-17 (×2): qty 2

## 2023-05-17 MED ORDER — APIXABAN 5 MG PO TABS
5.0000 mg | ORAL_TABLET | Freq: Two times a day (BID) | ORAL | Status: DC
  Administered 2023-05-17 – 2023-05-29 (×24): 5 mg via ORAL
  Filled 2023-05-17 (×24): qty 1

## 2023-05-17 MED ORDER — OXYCODONE HCL 5 MG PO TABS
5.0000 mg | ORAL_TABLET | Freq: Four times a day (QID) | ORAL | Status: DC | PRN
  Administered 2023-05-17 – 2023-05-24 (×12): 5 mg via ORAL
  Filled 2023-05-17 (×14): qty 1

## 2023-05-17 MED ORDER — POTASSIUM CHLORIDE CRYS ER 20 MEQ PO TBCR
40.0000 meq | EXTENDED_RELEASE_TABLET | Freq: Once | ORAL | Status: AC
  Administered 2023-05-17: 40 meq via ORAL
  Filled 2023-05-17: qty 2

## 2023-05-17 NOTE — Anesthesia Postprocedure Evaluation (Signed)
Anesthesia Post Note  [name]  Procedure(s) Performed: AMPUTATION BELOW KNEE (Left: Knee)  Patient location during evaluation: PACU Anesthesia Type: Regional Level of consciousness: awake and alert Pain management: pain level controlled Vital Signs Assessment: post-procedure vital signs reviewed and stable Respiratory status: spontaneous breathing, nonlabored ventilation and respiratory function stable Cardiovascular status: blood pressure returned to baseline and stable Postop Assessment: no apparent nausea or vomiting Anesthetic complications: no   No notable events documented.   Last Vitals:  Vitals:   05/17/23 0842 05/17/23 1041  BP: 131/87 117/76  Pulse: 86   Resp: 18   Temp: 37.1 C   SpO2: 99%     Last Pain:  Vitals:   05/17/23 1120  TempSrc:   PainSc: 0-No pain                 Foye Deer

## 2023-05-17 NOTE — Progress Notes (Signed)
Date of Admission:  05/10/2023   T   ID: Andrew Harding is a 73 y.o. male  Active Problems:   Type 2 diabetes mellitus with stage 3b chronic kidney disease, with long-term current use of insulin (HCC)   Benign essential hypertension   CKD (chronic kidney disease) stage 3, GFR 30-59 ml/min (HCC)   Coronary artery disease   Mixed hyperlipidemia   Atrial fibrillation (HCC)   Diabetic foot infection with possible osteomyelitis, left  (HCC)   Sepsis (HCC)   Open wound of left foot with complication   Gram-negative bacteremia    Subjective: Had BKA yesterday Doing okay Says he is feeling fine  Medications:   apixaban  5 mg Oral BID   insulin aspart  0-15 Units Subcutaneous TID WC   insulin aspart  0-5 Units Subcutaneous QHS   insulin glargine-yfgn  22 Units Subcutaneous Daily   metoprolol tartrate  25 mg Oral BID    Objective: Vital signs in last 24 hours: Patient Vitals for the past 24 hrs:  BP Temp Temp src Pulse Resp SpO2  05/17/23 1602 131/70 99.1 F (37.3 C) Oral 92 18 99 %  05/17/23 1041 117/76 -- -- -- -- --  05/17/23 0842 131/87 98.7 F (37.1 C) Oral 86 18 99 %  05/17/23 0432 121/68 98.1 F (36.7 C) Oral 78 20 99 %  05/16/23 2058 114/67 98.1 F (36.7 C) Oral 75 20 99 %      PHYSICAL EXAM:  General: Alert, cooperative, no distress, appears stated age.  Lungs: Clear to auscultation bilaterally. No Wheezing or Rhonchi. No rales. Heart: irregular Abdomen: Soft, non-tender,not distended. Bowel sounds normal. No masses Extremities: left BKA Skin: No rashes or lesions. Or bruising Lymph: Cervical, supraclavicular normal. Neurologic: Grossly non-focal  Lab Results    Latest Ref Rng & Units 05/17/2023    5:35 AM 05/12/2023    4:49 AM 05/11/2023    5:01 AM  CBC  WBC 4.0 - 10.5 K/uL 8.5  4.6  6.4   Hemoglobin 13.0 - 17.0 g/dL 9.7  19.1  47.8   Hematocrit 39.0 - 52.0 % 29.3  31.1  31.5   Platelets 150 - 400 K/uL 252  228  227        Latest Ref Rng & Units  05/17/2023    5:35 AM 05/12/2023    4:49 AM 05/11/2023    5:01 AM  CMP  Glucose 70 - 99 mg/dL 295  621  308   BUN 8 - 23 mg/dL 9  15  14    Creatinine 0.61 - 1.24 mg/dL 6.57  8.46  9.62   Sodium 135 - 145 mmol/L 136  135  132   Potassium 3.5 - 5.1 mmol/L 3.4  3.6  3.6   Chloride 98 - 111 mmol/L 109  105  100   CO2 22 - 32 mmol/L 19  19  21    Calcium 8.9 - 10.3 mg/dL 8.0  8.5  8.4   Total Protein 6.5 - 8.1 g/dL   6.9   Total Bilirubin 0.0 - 1.2 mg/dL   0.8   Alkaline Phos 38 - 126 U/L   52   AST 15 - 41 U/L   26   ALT 0 - 44 U/L   19       Microbiology:  Studies/Results: [place] OR NERVE BLOCK-IMAGE ONLY [facility]) Result Date: 05/16/2023 There is no interpretation for this exam.  This order is for images obtained during a surgical procedure.  Please See "Surgeries" Tab for more information regarding the procedure.      Assessment/Plan: Morganella bacteremia on cefepime- will treat until 05/20/23 ( 10 days) Repeat culture negative so far  Diabetic foot infection Left foot with ulcer on the lateral margin TMA of the left foot Has neuropathy and PAD Recent culture was enterobacter, klebsiella and MSSA  On cefepime MRI of the foot shows osteo of 5th metatarsal Pt had left BKA yesterday'  CKD-   CAD s/p CABG   Afib on eliquis and metoprolol Discussed the management with the patient

## 2023-05-17 NOTE — TOC Initial Note (Signed)
Transition of Care [facility]) - Initial/Assessment Note    Patient Details  Name: Andrew Harding MRN: [id number] Date of Birth: 05-10-1950  Transition of Care [facility]) CM/SW Contact:    Margarito Liner, LCSW Phone Number: 05/17/2023, 12:15 PM  Clinical Narrative:   CSW met with patient. No supports at bedside. CSW introduced role and explained that PT recommendations would be discussed. Patient is agreeable to SNF placement. No further concerns. CSW will continue to follow patient for support and facilitate discharge to SNF once medically stable.               Expected Discharge Plan: Skilled Nursing Facility Barriers to Discharge: Continued Medical Work up   Patient Goals and CMS Choice            Expected Discharge Plan and Services     Post Acute Care Choice: Skilled Nursing Facility Living arrangements for the past 2 months: Single Family Home                                      Prior Living Arrangements/Services Living arrangements for the past 2 months: Single Family Home Lives with:: Self Patient language and need for interpreter reviewed:: Yes Do you feel safe going back to the place where you live?: Yes      Need for Family Participation in Patient Care: Yes (Comment) Care giver support system in place?: Yes (comment)   Criminal Activity/Legal Involvement Pertinent to Current Situation/Hospitalization: No - Comment as needed  Activities of Daily Living   ADL Screening (condition at time of admission) Independently performs ADLs?: Yes (appropriate for developmental age) Is the patient deaf or have difficulty hearing?: Yes Does the patient have difficulty seeing, even when wearing glasses/contacts?: No Does the patient have difficulty concentrating, remembering, or making decisions?: No  Permission Sought/Granted Permission sought to share information with : Facility Industrial/product designer granted to share information with : Yes, Verbal Permission  Granted     Permission granted to share info w AGENCY: SNF's        Emotional Assessment Appearance:: Appears stated age Attitude/Demeanor/Rapport: Engaged, Gracious Affect (typically observed): Accepting, Appropriate, Calm, Pleasant Orientation: : Oriented to Self, Oriented to Place, Oriented to  Time, Oriented to Situation Alcohol / Substance Use: Not Applicable Psych Involvement: No (comment)  Admission diagnosis:  Sepsis (HCC) [A41.9] Patient Active Problem List   Diagnosis Date Noted   Gram-negative bacteremia 05/11/2023   Sepsis (HCC) 05/10/2023   Open wound of left foot with complication 05/10/2023   MSSA bacteremia 02/28/2023   Controlled type 2 diabetes mellitus with neuropathy (HCC) 02/27/2023   Postural dizziness with presyncope 02/26/2023   Diabetic peripheral neuropathy (HCC)    Wound drainage 09/02/2022   Diabetic foot infection with possible osteomyelitis, left  (HCC) 08/29/2022   S/P transmetatarsal amputation of foot, left (HCC) 08/29/2022   Acute osteomyelitis of left ankle or foot (HCC) 08/24/2022   Medication management 08/24/2022   Diabetic infection of left foot (HCC) 08/24/2022   Infection of left foot 08/19/2022   Atrial fibrillation (HCC) 08/19/2022   Cellulitis 08/17/2022   Hypotension due to hypovolemia 08/17/2022   Gout 04/05/2018   Vitamin D deficiency, unspecified 04/05/2018   Diabetic ulcer of toe of left foot associated with type 2 diabetes mellitus, limited to breakdown of skin (HCC) 12/21/2017   Status post amputation of toe of right foot (HCC) 11/01/2015   CKD (  chronic kidney disease) stage 3, GFR 30-59 ml/min (HCC) 10/08/2015   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   Osteomyelitis (HCC) 05/31/2015   Type 2 diabetes mellitus with stage 3b chronic kidney disease, with long-term current use of insulin (HCC) 05/31/2015   History of osteomyelitis 05/31/2015   Benign essential hypertension 09/14/2014   Coronary artery disease 09/17/2012    Mixed hyperlipidemia 09/17/2012   CAD 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   PCP:  Lynnea Ferrier, MD Pharmacy:   [id number] [place], [place] - [address] AT [facility] OF [address] [place] [address] Phone: [phone] Fax: [phone]  [facility] [address] [place] [address] Phone: [phone] Fax: [phone]     Social Drivers of Health (SDOH) Social History: SDOH Screenings   Food Insecurity: No Food Insecurity (05/12/2023)  Housing: Low Risk  (05/12/2023)  Transportation Needs: No Transportation Needs (05/12/2023)  Utilities: Not At Risk (05/12/2023)  Depression (PHQ2-9): Low Risk  (05/03/2023)  Financial Resource Strain: Low Risk  (12/13/2022)   Received from [facility]  Social Connections: Moderately Isolated (05/12/2023)  Tobacco Use: Medium Risk (05/16/2023)   SDOH Interventions:     Readmission Risk Interventions    02/28/2023   12:53 PM  Readmission Risk Prevention Plan  Transportation Screening Complete  PCP or Specialist Appt within 3-5 Days Complete  HRI or Home Care Consult Complete  Social Work Consult for Recovery Care Planning/Counseling Complete  Palliative Care Screening Not Applicable  Medication Review Oceanographer) Complete

## 2023-05-17 NOTE — Progress Notes (Signed)
  Progress Note    05/17/2023 12:48 PM 1 Day Post-Op  Subjective:  Andrew Harding is a 73 year old male who is now postop day 1 from a left below the knee amputation. Patient is working with physical therapy when I saw him. The lower extremity has the operative dressing in place. No complications to note. No complaints overnight. Vitals all remain stable.    Vitals:   05/17/23 0842 05/17/23 1041  BP: 131/87 117/76  Pulse: 86   Resp: 18   Temp: 98.7 F (37.1 C)   SpO2: 99%    Physical Exam: Cardiac:  RRR Lungs: Clear throughout on auscultation, no rales rhonchi or wheezing Incisions: Right groin puncture site with dressing clean dry and intact from angiogram. Left lower BKA with dressing clean dry and intact.  Extremities: Left lower BKA. Dressing clean dry and intact. No drainage noted.  Abdomen: Soft, nontender nondistended positive bowel sounds Neurologic: A&OX3  CBC    Component Value Date/Time   WBC 8.5 05/17/2023 0535   RBC 3.38 (L) 05/17/2023 0535   HGB 9.7 (L) 05/17/2023 0535   HCT 29.3 (L) 05/17/2023 0535   PLT 252 05/17/2023 0535   MCV 86.7 05/17/2023 0535   MCH 28.7 05/17/2023 0535   MCHC 33.1 05/17/2023 0535   RDW 13.8 05/17/2023 0535   LYMPHSABS 0.7 05/17/2023 0535   MONOABS 0.5 05/17/2023 0535   EOSABS 0.1 05/17/2023 0535   BASOSABS 0.0 05/17/2023 0535    BMET    Component Value Date/Time   NA 136 05/17/2023 0535   K 3.4 (L) 05/17/2023 0535   CL 109 05/17/2023 0535   CO2 19 (L) 05/17/2023 0535   GLUCOSE 172 (H) 05/17/2023 0535   GLUCOSE 402 (H) 09/17/2012 1417   BUN 9 05/17/2023 0535   CREATININE 1.13 05/17/2023 0535   CALCIUM 8.0 (L) 05/17/2023 0535   GFRNONAA >60 05/17/2023 0535   GFRAA 58 (L) 06/02/2015 0656    INR    Component Value Date/Time   INR 1.3 (H) 05/10/2023 0939     Intake/Output Summary (Last 24 hours) at 05/17/2023 1248 Last data filed at 05/17/2023 0358 Gross per 24 hour  Intake 650 ml  Output 600 ml  Net 50 ml      Assessment/Plan:  73 y.o. male is s/p Left BKA 1 Day Post-Op Patient is recovering as expected.   PLAN Pain medication PRN Advance Diet as tolerated.  PT/OT Consults OOB to chair while awake. Incentive spirometry q 1hr while awake.  First Dressing change by vascular Surgery on Saturday 05/19/2023   DVT prophylaxis:  Eliquis 5 mg BID    Marcie Bal [facility] 05/17/2023 12:48 PM

## 2023-05-17 NOTE — Evaluation (Signed)
Physical Therapy Evaluation Patient Details Name: Andrew Harding MRN: [id number] DOB: 07/10/1950 Today's Date: 05/17/2023  History of Present Illness  Andrew Harding is a 73 y.o. male with medical history significant of insulin-dependent DM 2, A-fib on Eliquis, HTN, CAD status post CABG, CKD 3B, PAD s/p transmetatarsal amputation left foot presenting w/ sepsis, suspected LLE cellulitis vs. Osteomyelitis. Pt reports worsening pain over L foot for multiple weeks.  Noted to have been admitted 03/2023 for issues including MSSA bacteremia, diabetic foot infection.Has had outpt follow up w/ ID and podiatry since discharge.  Admitted for sepsis. MRI of left foot with concern of acute osteomyelitis involving the remaining first metatarsal, base of fifth metatarsal, also involving the remaining proximal shafts of second and third metatarsal stem. Also recently seen by ID via Dr. Rivka Safer 05/08/2022 with cultures showing enterobacter, klebsiella and MSSA in the recent culture taken from the lateral wound. Pt went for limb salvage BKA LLE on 05/16/23.  Clinical Impression  Pt awake on entry, appears calm, comfortable. Wife at bedside. Pt denies any pain. Pt able to perform short distance AMB with RW, transfers c RW from elevated surface, and supine to seated EOB all at minGuard assist or better. Pt is fatigued after AMB, unable to tolerate much farther at present. VSS. Began education on post BKA precautions, but will bring handout next session. Pt up to chair at end of session, all needs met.       If plan is discharge home, recommend the following: A little help with walking and/or transfers;A little help with bathing/dressing/bathroom;Assistance with cooking/housework;Assist for transportation;Help with stairs or ramp for entrance   Can travel by private vehicle   No    Equipment Recommendations None recommended by PT  Recommendations for Other Services       Functional Status Assessment Patient has had a  recent decline in their functional status and demonstrates the ability to make significant improvements in function in a reasonable and predictable amount of time.     Precautions / Restrictions Precautions Precautions: Fall Restrictions Weight Bearing Restrictions Per Provider Order: Yes LLE Weight Bearing Per Provider Order: Non weight bearing      Mobility  Bed Mobility Overal bed mobility: Needs Assistance Bed Mobility: Supine to Sit     Supine to sit: Supervision          Transfers Overall transfer level: Needs assistance Equipment used: Rolling walker (2 wheels) Transfers: Sit to/from Stand Sit to Stand: Contact guard assist, From elevated surface           General transfer comment: no dizziness, no frank LOB    Ambulation/Gait Ambulation/Gait assistance: Contact guard assist, Supervision Gait Distance (Feet): 50 Feet Assistive device: Rolling walker (2 wheels) Gait Pattern/deviations: Step-to pattern       General Gait Details: safe use of RW; fatigued, but no concernign symptoms  Stairs            Wheelchair Mobility     Tilt Bed    Modified Rankin (Stroke Patients Only)       Balance                                             Pertinent Vitals/Pain Pain Assessment Pain Assessment: No/denies pain    Home Living Family/patient expects to be discharged to:: Private residence Living Arrangements: Alone Available Help at Discharge: Family;Friend(s) (  wife lives seperately, works full time during day, does not drive during day.) Type of Home: House Home Access: Stairs to enter Entrance Stairs-Rails: None Entrance Stairs-Number of Steps: 4 steps with wall on the left but no handrail   Home Layout: One level Home Equipment: Agricultural consultant (2 wheels);Cane - single point Additional Comments: no other equipment    Prior Function Prior Level of Function : Working/employed;Driving             Mobility Comments:  Reports bein gI with all aspects of care and mobility. Works full time as a Government social research officer at the [employer] (requires walking, standing, sitting). Denies falls in the last 6 months ADLs Comments: independent     Extremity/Trunk Assessment                Communication      Cognition Arousal: Alert Behavior During Therapy: WFL for tasks assessed/performed Overall Cognitive Status: Within Functional Limits for tasks assessed                                          General Comments      Exercises Amputee Exercises Quad Sets: AROM, Left, 10 reps Hip Flexion/Marching: AROM, Left, 10 reps Knee Flexion: AROM, Left, 10 reps   Assessment/Plan    PT Assessment Patient needs continued PT services  PT Problem List Decreased mobility;Decreased knowledge of precautions;Decreased activity tolerance;Cardiopulmonary status limiting activity;Decreased balance;Decreased knowledge of use of DME;Impaired sensation;Decreased skin integrity;Decreased range of motion       PT Treatment Interventions DME instruction;Therapeutic exercise;Gait training;Balance training;Stair training;Neuromuscular re-education;Functional mobility training;Therapeutic activities;Patient/family education    PT Goals (Current goals can be found in the Care Plan section)  Acute Rehab PT Goals Patient Stated Goal: strong recovery for transition to prosthesis use PT Goal Formulation: With patient Time For Goal Achievement: 05/31/23 Potential to Achieve Goals: Good    Frequency 7X/week     Co-evaluation               AM-PAC PT "6 Clicks" Mobility  Outcome Measure Help needed turning from your back to your side while in a flat bed without using bedrails?: A Little Help needed moving from lying on your back to sitting on the side of a flat bed without using bedrails?: A Little Help needed moving to and from a bed to a chair (including a wheelchair)?: A Little Help needed standing up from  a chair using your arms (e.g., wheelchair or bedside chair)?: A Little Help needed to walk in hospital room?: A Little Help needed climbing 3-5 steps with a railing? : A Lot 6 Click Score: 17    End of Session Equipment Utilized During Treatment: Gait belt Activity Tolerance: Patient tolerated treatment well;Patient limited by fatigue Patient left: in chair;with call bell/phone within reach;with family/visitor present Nurse Communication: Mobility status PT Visit Diagnosis: Unsteadiness on feet (R26.81);Difficulty in walking, not elsewhere classified (R26.2);Other abnormalities of gait and mobility (R26.89)    Time: 1610-9604 PT Time Calculation (min) (ACUTE ONLY): 33 min   Charges:   PT Evaluation $PT Eval Moderate Complexity: 1 Mod PT Treatments $Therapeutic Activity: 8-22 mins PT General Charges $$ ACUTE PT VISIT: 1 Visit        11:30 AM, 05/17/23 Rosamaria Lints, PT, DPT Physical Therapist - [facility]  [phone] (ASCOM)    Gor Vestal C 05/17/2023, 11:27 AM

## 2023-05-17 NOTE — Evaluation (Signed)
Occupational Therapy Evaluation Patient Details Name: Andrew Harding MRN: [id number] DOB: [birth year] Today's Date: 05/17/2023   History of Present Illness Andrew Harding is a 73 y.o. male with medical history significant of insulin-dependent DM 2, A-fib on Eliquis, HTN, CAD status post CABG, CKD 3B, PAD s/p transmetatarsal amputation left foot presenting w/ sepsis, suspected LLE cellulitis vs. Osteomyelitis. 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   Clinical Impression   [name] was seen for OT evaluation this date. Prior to hospital admission, pt was IND and working full time. Pt lives alone. Pt currently requires CGA standing grooming task, tolerates <2 min with B forearm support on counter. SETUP to shave in sitting. CGA + RW for sit<>stand and side steps along EOB. Pt reports fatigue and returned to bed MOD I. Educate don bed level HEP as described below. Pt would benefit from skilled OT to address noted impairments and functional limitations (see below for any additional details). Upon hospital discharge, recommend OT follow up.      If plan is discharge home, recommend the following: A little help with walking and/or transfers;A little help with bathing/dressing/bathroom;Help with stairs or ramp for entrance    Functional Status Assessment  Patient has had a recent decline in their functional status and demonstrates the ability to make significant improvements in function in a reasonable and predictable amount of time.  Equipment Recommendations  BSC/3in1    Recommendations for Other Services       Precautions / Restrictions Precautions Precautions: Fall Restrictions Weight Bearing Restrictions Per Provider Order: Yes LLE Weight Bearing Per Provider Order: Non weight bearing      Mobility Bed Mobility Overal bed mobility: Modified Independent             General bed mobility comments: HOB elevated and rails    Transfers Overall transfer level: Needs assistance Equipment used: Rolling walker  (2 wheels) Transfers: Sit to/from Stand Sit to Stand: Contact guard assist                  Balance Overall balance assessment: Needs assistance Sitting-balance support: No upper extremity supported, Feet supported Sitting balance-Leahy Scale: Normal     Standing balance support: Bilateral upper extremity supported, During functional activity (B forearms) Standing balance-Leahy Scale: Fair                             ADL either performed or assessed with clinical judgement   ADL Overall ADL's : Needs assistance/impaired                                       General ADL Comments: CGA standing grooming task, tolerates <2 min. SETUP seated grooming tasks. CGA + RW for simulated BSC t/f      Pertinent Vitals/Pain Pain Assessment Pain Assessment: 0-10 Pain Score: 3  Pain Location: residual limb Pain Descriptors / Indicators: Sore Pain Intervention(s): Limited activity within patient's tolerance, Repositioned     Extremity/Trunk Assessment Upper Extremity Assessment Upper Extremity Assessment: Overall WFL for tasks assessed   Lower Extremity Assessment Lower Extremity Assessment: Overall WFL for tasks assessed       Communication Communication Communication: No apparent difficulties   Cognition Arousal: Alert Behavior During Therapy: WFL for tasks assessed/performed Overall Cognitive Status: Within Functional Limits for tasks assessed  General Comments       Exercises Exercises: Amputee Amputee Exercises Quad Sets: AROM, Strengthening, Left, 5 reps, Supine Hip Extension: AROM, Strengthening, Left, Sidelying, 10 reps Knee Extension: AROM, Strengthening, Left, 5 reps, Supine Straight Leg Raises: AROM, Strengthening, Left, 5 reps, Supine Other Exercises Other Exercises: reviewed HEP   Shoulder Instructions      Home Living Family/patient expects to be discharged to::  Private residence Living Arrangements: Alone Available Help at Discharge: Family;Friend(s) Type of Home: House Home Access: Stairs to enter [employer] of Steps: 4 steps with wall on the left but no handrail Entrance Stairs-Rails: None Home Layout: One level     Bathroom Shower/Tub: Chief Strategy Officer: Handicapped height Bathroom Accessibility: Yes   Home Equipment: Agricultural consultant (2 wheels);Cane - single point          Prior Functioning/Environment Prior Level of Function : Working/employed;Driving             Mobility Comments: Works full time as a Government social research officer at the [employer] (requires walking, standing, sitting).          OT Problem List: Decreased strength;Decreased range of motion;Decreased activity tolerance;Impaired balance (sitting and/or standing)      OT Treatment/Interventions: Self-care/ADL training;Therapeutic exercise;Energy conservation;DME and/or AE instruction;Therapeutic activities    OT Goals(Current goals can be found in the care plan section) Acute Rehab OT Goals Patient Stated Goal: go home OT Goal Formulation: With patient Time For Goal Achievement: 05/31/23 Potential to Achieve Goals: Good ADL Goals Pt Will Perform Grooming: Independently;standing Pt Will Perform Lower Body Dressing: Independently;sit to/from stand Pt Will Transfer to Toilet: with modified independence;ambulating;regular height toilet  OT Frequency: Min 1X/week    Co-evaluation              AM-PAC OT "6 Clicks" Daily Activity     Outcome Measure Help from another person eating meals?: None Help from another person taking care of personal grooming?: A Little Help from another person toileting, which includes using toliet, bedpan, or urinal?: A Little Help from another person bathing (including washing, rinsing, drying)?: A Little Help from another person to put on and taking off regular upper body clothing?: None Help from another  person to put on and taking off regular lower body clothing?: A Little 6 Click Score: 20   End of Session Equipment Utilized During Treatment: Rolling walker (2 wheels);Gait belt  Activity Tolerance: Patient tolerated treatment well Patient left: in bed;with call bell/phone within reach;with bed alarm set  OT Visit Diagnosis: Other abnormalities of gait and mobility (R26.89);Muscle weakness (generalized) (M62.81)                Time: 1610-9604 OT Time Calculation (min): 19 min Charges:  OT General Charges $OT Visit: 1 Visit OT Evaluation $OT Eval Moderate Complexity: 1 Mod OT Treatments $Self Care/Home Management : 8-22 mins  Kathie Dike, M.S. OTR/L  05/17/23, 2:56 PM  ascom [phone]

## 2023-05-17 NOTE — [place] FL2 (Signed)
[place] MEDICAID FL2 LEVEL OF CARE FORM     IDENTIFICATION  Patient Name: Andrew Harding Birthdate: Jan 20, 1951 Sex: male Admission Date (Current Location): 05/10/2023  [facility] and [place] Number:  Chiropodist and Address:  [facility], [address] [place], [place] [address]      Provider Number: [id number]  Attending Physician Name and Address:  Lurene Shadow, MD  Relative Name and Phone Number:       Current Level of Care: Hospital Recommended Level of Care: Skilled Nursing Facility Prior Approval Number:    Date Approved/Denied:   PASRR Number: [id number]  Discharge Plan: SNF    Current Diagnoses: Patient Active Problem List   Diagnosis Date Noted   Gram-negative bacteremia 05/11/2023   Sepsis (HCC) 05/10/2023   Open wound of left foot with complication 05/10/2023   MSSA bacteremia 02/28/2023   Controlled type 2 diabetes mellitus with neuropathy (HCC) 02/27/2023   Postural dizziness with presyncope 02/26/2023   Diabetic peripheral neuropathy (HCC)    Wound drainage 09/02/2022   Diabetic foot infection with possible osteomyelitis, left  (HCC) 08/29/2022   S/P transmetatarsal amputation of foot, left (HCC) 08/29/2022   Acute osteomyelitis of left ankle or foot (HCC) 08/24/2022   Medication management 08/24/2022   Diabetic infection of left foot (HCC) 08/24/2022   Infection of left foot 08/19/2022   Atrial fibrillation (HCC) 08/19/2022   Cellulitis 08/17/2022   Hypotension due to hypovolemia 08/17/2022   Gout 04/05/2018   Vitamin D deficiency, unspecified 04/05/2018   Diabetic ulcer of toe of left foot associated with type 2 diabetes mellitus, limited to breakdown of skin (HCC) 12/21/2017   Status post amputation of toe of right foot (HCC) 11/01/2015   CKD (chronic kidney disease) stage 3, GFR 30-59 ml/min (HCC) 10/08/2015   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   Osteomyelitis (HCC) 05/31/2015   Type 2 diabetes mellitus  with stage 3b chronic kidney disease, with long-term current use of insulin (HCC) 05/31/2015   History of osteomyelitis 05/31/2015   Benign essential hypertension 09/14/2014   Coronary artery disease 09/17/2012   Mixed hyperlipidemia 09/17/2012   CAD 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    Orientation RESPIRATION BLADDER Height & Weight     Self, Time, Situation, Place  Normal Continent Weight: 222 lb 10.6 oz (101 kg) Height:  6\' 3"  (190.5 cm)  BEHAVIORAL SYMPTOMS/MOOD NEUROLOGICAL BOWEL NUTRITION STATUS   (None)  (None) Continent Diet (Carb modified.)  AMBULATORY STATUS COMMUNICATION OF NEEDS Skin   Limited Assist Verbally Surgical wounds (Incision left leg: Gauze.)                       Personal Care Assistance Level of Assistance  Bathing, Feeding, Dressing Bathing Assistance: Limited assistance Feeding assistance: Limited assistance Dressing Assistance: Limited assistance     Functional Limitations Info  Sight, Hearing, Speech Sight Info: Adequate Hearing Info: Adequate Speech Info: Adequate    SPECIAL CARE FACTORS FREQUENCY  PT (By licensed PT), OT (By licensed OT)     PT Frequency: 5 x week OT Frequency: 5 x week            Contractures Contractures Info: Not present    Additional Factors Info  Code Status, Allergies Code Status Info: Full Allergies Info: NKDA           Current Medications (05/17/2023):  This is the current hospital active medication list Current Facility-Administered Medications  Medication Dose Route Frequency Provider Last Rate Last  Admin   acetaminophen (TYLENOL) tablet 650 mg  650 mg Oral Q6H PRN Lurene Shadow, MD       apixaban (ELIQUIS) tablet 5 mg  5 mg Oral BID Pace, Brien R, NP       ceFEPIme (MAXIPIME) 2 g in sodium chloride 0.9 % 100 mL IVPB  2 g Intravenous Q8H Annice Needy, MD 200 mL/hr at 05/17/23 0536 2 g at 05/17/23 0536   hydrOXYzine (ATARAX) tablet 25 mg  25 mg Oral TID PRN Annice Needy, MD   25 mg at 05/16/23 2318   insulin  aspart (novoLOG) injection 0-15 Units  0-15 Units Subcutaneous TID WC Annice Needy, MD   3 Units at 05/17/23 0905   insulin aspart (novoLOG) injection 0-5 Units  0-5 Units Subcutaneous QHS Annice Needy, MD   2 Units at 05/14/23 2130   insulin glargine-yfgn (SEMGLEE) injection 22 Units  22 Units Subcutaneous Daily Annice Needy, MD   22 Units at 05/17/23 1015   morphine (PF) 4 MG/ML injection 4 mg  4 mg Intravenous Q4H PRN Lurene Shadow, MD       ondansetron (ZOFRAN) tablet 4 mg  4 mg Oral Q6H PRN Annice Needy, MD       Or   ondansetron (ZOFRAN) injection 4 mg  4 mg Intravenous Q6H PRN Annice Needy, MD       oxyCODONE (Oxy IR/ROXICODONE) immediate release tablet 5 mg  5 mg Oral Q6H PRN Lurene Shadow, MD         Discharge Medications: Please see discharge summary for a list of discharge medications.  Relevant Imaging Results:  Relevant Lab Results:   Additional Information SS#: [id number]  Margarito Liner, LCSW

## 2023-05-17 NOTE — Progress Notes (Addendum)
Progress Note    Andrew Harding  MWU:[id number] DOB: 03/23/1951  DOA: 05/10/2023 PCP: Andrew Ferrier, MD      Brief Narrative:    Medical records reviewed and are as summarized below:  Andrew Harding is a 73 y.o. male with medical history significant of insulin-dependent DM 2, A-fib on Eliquis, HTN, CAD status post CABG, CKD 3B, PAD s/p transmetatarsal amputation left foot presenting w/ sepsis, suspected LLE cellulitis vs. Osteomyelitis. Pt reports worsening pain over L foot for multiple weeks.  Noted to have been admitted 03/2023 for issues including MSSA bacteremia, diabetic foot infection.Has had outpt follow up w/ ID and podiatry since discharge.    Noted recent evaluation w/ ID  cultures showing enterobacter, klebsiella and MSSA in the recent culture taken from the lateral wound- transitioned from cefadroxil to cipro. No reported medical noncompliance. Had follow up with podiatry 05/03/2023 w/ plan for MRI and vascular referral.    On presentation patient was febrile at 100.5, heart rate 100s, WBC 10.8, sodium 127, creatinine 1.42, glucose 242, lactic acid 2.3>>1.1.  DG left foot with soft tissue swelling.  Chest x-ray normal.  Procalcitonin 0.48. Vascular surgery and podiatry was consulted. Started on broad-spectrum antibiotics. MRI of the left foot was concerning for osteomyelitis.      Assessment/Plan:   Active Problems:   Sepsis (HCC)   Diabetic foot infection with possible osteomyelitis, left  (HCC)   Atrial fibrillation (HCC)   Benign essential hypertension   CKD (chronic kidney disease) stage 3, GFR 30-59 ml/min (HCC)   Coronary artery disease   Mixed hyperlipidemia   Type 2 diabetes mellitus with stage 3b chronic kidney disease, with long-term current use of insulin (HCC)   Open wound of left foot with complication   Gram-negative bacteremia    Body mass index is 27.83 kg/m.    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  Analgesics as needed for pain.   Continue IV cefepime.  Follow-up with ID specialist for further recommendations.   Transient hypotension Resolved    Hypokalemia Replete potassium and monitor levels    Atrial fibrillation (HCC) Rate controlled. Restart Eliquis and metoprolol     Type 2 diabetes mellitus with hyperglycemia Resume Semglee NovoLog as needed for hyperglycemia.     Comorbidities include CAD s/p CABG, hypertension, mixed hyperlipidemia, CKD stage IIIa.           Diet Order             Diet Carb Modified Fluid consistency: Thin; Room service appropriate? Yes  Diet effective now                            Consultants: ID specialist Vascular surgeon  Procedures: Aortogram and selective lower extremity angiogram, percutaneous transluminal angioplasty of left anterior tibial artery on 05/14/2023    Medications:    apixaban  5 mg Oral BID   insulin aspart  0-15 Units Subcutaneous TID WC   insulin aspart  0-5 Units Subcutaneous QHS   insulin glargine-yfgn  22 Units Subcutaneous Daily   Continuous Infusions:  ceFEPime (MAXIPIME) IV 2 g (05/17/23 1244)     Anti-infectives (From admission, onward)    Start     Dose/Rate Route Frequency Ordered Stop   05/16/23 1030  ceFAZolin (ANCEF) IVPB 2g/100 mL premix        2 g 200 mL/hr over 30 Minutes Intravenous 30 min pre-op 05/15/23 2150 05/16/23 1115   05/12/23 1400  ceFEPIme (MAXIPIME) 2 g in sodium chloride 0.9 % 100 mL IVPB        2 g 200 mL/hr over 30 Minutes Intravenous Every 8 hours 05/12/23 1318     05/11/23 1300  vancomycin (VANCOREADY) IVPB 1750 mg/350 mL  Status:  Discontinued        1,750 mg 175 mL/hr over 120 Minutes Intravenous Every 24 hours 05/10/23 1208 05/11/23 1537   05/11/23 1231   ceFAZolin (ANCEF) IVPB 2g/100 mL premix  Status:  Discontinued        2 g 200 mL/hr over 30 Minutes Intravenous 30 min pre-op 05/11/23 1231 05/11/23 1423   05/10/23 2300  ceFEPIme (MAXIPIME) 2 g in sodium chloride 0.9 % 100 mL IVPB  Status:  Discontinued        2 g 200 mL/hr over 30 Minutes Intravenous Every 12 hours 05/10/23 1208 05/12/23 1318   05/10/23 2000  metroNIDAZOLE (FLAGYL) IVPB 500 mg        500 mg 100 mL/hr over 60 Minutes Intravenous Every 12 hours 05/10/23 1145 05/17/23 1009   05/10/23 1100  ceFEPIme (MAXIPIME) 2 g in sodium chloride 0.9 % 100 mL IVPB        2 g 200 mL/hr over 30 Minutes Intravenous  Once 05/10/23 1050 05/10/23 1141   05/10/23 1100  vancomycin (VANCOREADY) IVPB 2000 mg/400 mL        2,000 mg 200 mL/hr over 120 Minutes Intravenous  Once 05/10/23 1050 05/10/23 1528              Family Communication/Anticipated D/C date and plan/Code Status   DVT prophylaxis:  apixaban (ELIQUIS) tablet 5 mg     Code Status: Full Code  Family Communication: Plan discussed with his wife at the bedside Disposition Plan: Plan to discharge to home versus SNF   Status is: Inpatient Remains inpatient appropriate because: Left foot infection       Subjective:   Interval events noted.  His wife was at the bedside.  She reported outpatient was up in the chair for about 30 minutes and started feeling dizzy.  Reportedly, his systolic blood pressure was in the 80s.  Repeat BP came back up to 117/76.  Currently, patient has no complaints.   Objective:    Vitals:   05/16/23 2058 05/17/23 0432 05/17/23 0842 05/17/23 1041  BP: 114/67 121/68 131/87 117/76  Pulse: 75 78 86   Resp: 20 20 18    Temp: 98.1 F (36.7 C) 98.1 F (36.7 C) 98.7 F (37.1 C)   TempSrc: Oral Oral Oral   SpO2: 99% 99% 99%   Weight:      Height:       No data found.   Intake/Output Summary (Last 24 hours) at 05/17/2023 1451 Last data filed at 05/17/2023 0358 Gross per 24 hour  Intake  0 ml  Output 350 ml  Net -350 ml   Filed Weights   05/10/23 0931 05/16/23 1103  Weight: 101 kg 101 kg    Exam:  GEN: NAD SKIN: Warm and dry EYES: No pallor or icterus ENT: MMM CV: RRR PULM: CTA B ABD: soft, ND, NT, +BS CNS: AAO x 3, non focal EXT: Dressing on the left AKA stump wound is clean, dry and intact      Data Reviewed:  I have personally reviewed following labs and imaging studies:  Labs: Labs show the following:   Basic Metabolic Panel: Recent Labs  Lab 05/11/23 0501 05/12/23 0449 05/17/23 0535  NA 132* 135 136  K 3.6 3.6 3.4*  CL 100 105 109  CO2 21* 19* 19*  GLUCOSE 153* 148* 172*  BUN 14 15 9   CREATININE 1.30* 1.21 1.13  CALCIUM 8.4* 8.5* 8.0*   GFR Estimated Creatinine Clearance: 69.6 mL/min (by C-G formula based on SCr of 1.13 mg/dL). Liver Function Tests: Recent Labs  Lab 05/11/23 0501  AST 26  ALT 19  ALKPHOS 52  BILITOT 0.8  PROT 6.9  ALBUMIN 3.1*   No results for input(s): "LIPASE", "AMYLASE" in the last 168 hours. No results for input(s): "AMMONIA" in the last 168 hours. Coagulation profile No results for input(s): "INR", "PROTIME" in the last 168 hours.   CBC: Recent Labs  Lab 05/11/23 0501 05/12/23 0449 05/17/23 0535  WBC 6.4 4.6 8.5  NEUTROABS  --   --  7.1  HGB 10.7* 10.6* 9.7*  HCT 31.5* 31.1* 29.3*  MCV 84.9 83.6 86.7  PLT 227 228 252   Cardiac Enzymes: No results for input(s): "CKTOTAL", "CKMB", "CKMBINDEX", "TROPONINI" in the last 168 hours. BNP (last 3 results) No results for input(s): "PROBNP" in the last 8760 hours. CBG: Recent Labs  Lab 05/16/23 1848 05/16/23 2229 05/17/23 0843 05/17/23 1029 05/17/23 1233  GLUCAP 183* 195* 162* 163* 211*   D-Dimer: No results for input(s): "DDIMER" in the last 72 hours. Hgb A1c: No results for input(s): "HGBA1C" in the last 72 hours. Lipid Profile: No results for input(s): "CHOL", "HDL", "LDLCALC", "TRIG", "CHOLHDL", "LDLDIRECT" in the last 72  hours. Thyroid function studies: No results for input(s): "TSH", "T4TOTAL", "T3FREE", "THYROIDAB" in the last 72 hours.  Invalid input(s): "FREET3" Anemia work up: No results for input(s): "VITAMINB12", "FOLATE", "FERRITIN", "TIBC", "IRON", "RETICCTPCT" in the last 72 hours. Sepsis Labs: Recent Labs  Lab 05/11/23 0501 05/12/23 0449 05/17/23 0535  WBC 6.4 4.6 8.5    Microbiology Recent Results (from the past 240 hours)  Culture, blood (Routine x 2)     Status: Abnormal   Collection Time: 05/10/23  9:38 AM   Specimen: BLOOD  Result Value Ref Range Status   Specimen Description   Final    BLOOD RIGHT ANTECUBITAL Performed at [facility], [address], [place], [place] [address]    Special Requests   Final    BOTTLES DRAWN AEROBIC AND ANAEROBIC Blood Culture results may not be optimal due to an inadequate volume of blood received in culture bottles Performed at [facility], [address], [place], [place] [address]    Culture  Setup Time   Final    GRAM NEGATIVE RODS IN BOTH AEROBIC AND ANAEROBIC BOTTLES CRITICAL RESULT CALLED TO, READ BACK BY AND VERIFIED WITH: NATHAN BELEUE @0252  ON 05/11/23 SKL Performed at [facility], [address], [place], [place] [address]    Culture [facility] MORGANII (A)  Final   Report Status 05/13/2023 FINAL  Final   Organism ID, Bacteria MORGANELLA MORGANII  Final      Susceptibility   Morganella morganii - MIC*    AMPICILLIN >=32 RESISTANT Resistant     CEFTAZIDIME <=1 SENSITIVE Sensitive     CIPROFLOXACIN <=0.25 SENSITIVE Sensitive     GENTAMICIN <=1 SENSITIVE Sensitive     IMIPENEM 4 SENSITIVE Sensitive     TRIMETH/SULFA <=20 SENSITIVE Sensitive  AMPICILLIN/SULBACTAM 16 INTERMEDIATE Intermediate     PIP/TAZO <=4 SENSITIVE Sensitive ug/mL    * MORGANELLA MORGANII  Blood Culture ID Panel (Reflexed)     Status: Abnormal   Collection Time: 05/10/23  9:38 AM  Result Value Ref Range Status   Enterococcus  faecalis NOT DETECTED NOT DETECTED Final   Enterococcus Faecium NOT DETECTED NOT DETECTED Final   Listeria monocytogenes NOT DETECTED NOT DETECTED Final   Staphylococcus species NOT DETECTED NOT DETECTED Final   Staphylococcus aureus (BCID) NOT DETECTED NOT DETECTED Final   Staphylococcus epidermidis NOT DETECTED NOT DETECTED Final   Staphylococcus lugdunensis NOT DETECTED NOT DETECTED Final   Streptococcus species NOT DETECTED NOT DETECTED Final   Streptococcus agalactiae NOT DETECTED NOT DETECTED Final   Streptococcus pneumoniae NOT DETECTED NOT DETECTED Final   Streptococcus pyogenes NOT DETECTED NOT DETECTED Final   A.calcoaceticus-baumannii NOT DETECTED NOT DETECTED Final   Bacteroides fragilis NOT DETECTED NOT DETECTED Final   Enterobacterales DETECTED (A) NOT DETECTED Final    Comment: Enterobacterales represent a large order of gram negative bacteria, not a single organism. Refer to culture for further identification. CRITICAL RESULT CALLED TO, READ BACK BY AND VERIFIED WITH: NATHAN BELEUE @0252  ON 05/11/23 SKL    Enterobacter cloacae complex NOT DETECTED NOT DETECTED Final   Escherichia coli NOT DETECTED NOT DETECTED Final   Klebsiella aerogenes NOT DETECTED NOT DETECTED Final   Klebsiella oxytoca NOT DETECTED NOT DETECTED Final   Klebsiella pneumoniae NOT DETECTED NOT DETECTED Final   Proteus species NOT DETECTED NOT DETECTED Final   Salmonella species NOT DETECTED NOT DETECTED Final   Serratia marcescens NOT DETECTED NOT DETECTED Final   Haemophilus influenzae NOT DETECTED NOT DETECTED Final   Neisseria meningitidis NOT DETECTED NOT DETECTED Final   Pseudomonas aeruginosa NOT DETECTED NOT DETECTED Final   Stenotrophomonas maltophilia NOT DETECTED NOT DETECTED Final   Candida albicans NOT DETECTED NOT DETECTED Final   Candida auris NOT DETECTED NOT DETECTED Final   Candida glabrata NOT DETECTED NOT DETECTED Final   Candida krusei NOT DETECTED NOT DETECTED Final   Candida  parapsilosis NOT DETECTED NOT DETECTED Final   Candida tropicalis NOT DETECTED NOT DETECTED Final   Cryptococcus neoformans/gattii NOT DETECTED NOT DETECTED Final   CTX-M ESBL NOT DETECTED NOT DETECTED Final   Carbapenem resistance IMP NOT DETECTED NOT DETECTED Final   Carbapenem resistance KPC NOT DETECTED NOT DETECTED Final   Carbapenem resistance NDM NOT DETECTED NOT DETECTED Final   Carbapenem resist OXA 48 LIKE NOT DETECTED NOT DETECTED Final   Carbapenem resistance VIM NOT DETECTED NOT DETECTED Final    Comment: Performed at [facility], [address] [place], [place] [address]  Culture, blood (Routine x 2)     Status: Abnormal   Collection Time: 05/10/23  9:39 AM   Specimen: BLOOD  Result Value Ref Range Status   Specimen Description   Final    BLOOD LEFT ANTECUBITAL Performed at [facility], [address] [place], [place] [address]    Special Requests   Final    BOTTLES DRAWN AEROBIC AND ANAEROBIC Blood Culture results may not be optimal due to an inadequate volume of blood received in culture bottles Performed at [facility], [address] [place], [place] [address]    Culture  Setup Time   Final    GRAM NEGATIVE RODS IN BOTH AEROBIC AND ANAEROBIC BOTTLES CRITICAL RESULT CALLED TO, READ BACK BY AND VERIFIED WITH: NATHAN BELUE @ 0348 05/12/23 BGH  Performed at [facility], [address] [place], [place] [address]    Culture (A)  Final    MORGANELLA MORGANII SUSCEPTIBILITIES PERFORMED ON PREVIOUS CULTURE WITHIN THE LAST 5 DAYS. Performed at [facility], [address] [place], [place] [address]    Report Status 05/14/2023 FINAL  Final  Resp panel by RT-PCR (RSV, Flu A&B, Covid) Anterior Nasal Swab     Status: None   Collection Time: 05/10/23 10:21 AM   Specimen: Anterior Nasal Swab  Result Value Ref Range Status   SARS Coronavirus 2 by RT PCR NEGATIVE NEGATIVE Final    Comment: (NOTE) SARS-CoV-2 target nucleic  acids are NOT DETECTED.  The SARS-CoV-2 RNA is generally detectable in upper respiratory specimens during the acute phase of infection. The lowest concentration of SARS-CoV-2 viral copies this assay can detect is 138 copies/mL. A negative result does not preclude SARS-Cov-2 infection and should not be used as the sole basis for treatment or other patient management decisions. A negative result may occur with  improper specimen collection/handling, submission of specimen other than nasopharyngeal swab, presence of viral mutation(s) within the areas targeted by this assay, and inadequate number of viral copies(<138 copies/mL). A negative result must be combined with clinical observations, patient history, and epidemiological information. The expected result is Negative.  Fact Sheet for Patients:  BloggerCourse.com  Fact Sheet for Healthcare Providers:  SeriousBroker.it  This test is no t yet approved or cleared by the [place] FDA and  has been authorized for detection and/or diagnosis of SARS-CoV-2 by FDA under an Emergency Use Authorization (EUA). This EUA will remain  in effect (meaning this test can be used) for the duration of the COVID-19 declaration under Section 564(b)(1) of the Act, 21 U.S.C.section 360bbb-3(b)(1), unless the authorization is terminated  or revoked sooner.       Influenza A by PCR NEGATIVE NEGATIVE Final   Influenza B by PCR NEGATIVE NEGATIVE Final    Comment: (NOTE) The Xpert Xpress SARS-CoV-2/FLU/RSV plus assay is intended as an aid in the diagnosis of influenza from Nasopharyngeal swab specimens and should not be used as a sole basis for treatment. Nasal washings and aspirates are unacceptable for Xpert Xpress SARS-CoV-2/FLU/RSV testing.  Fact Sheet for Patients: BloggerCourse.com  Fact Sheet for Healthcare Providers: SeriousBroker.it  This  test is not yet approved or cleared by the [place] FDA and has been authorized for detection and/or diagnosis of SARS-CoV-2 by FDA under an Emergency Use Authorization (EUA). This EUA will remain in effect (meaning this test can be used) for the duration of the COVID-19 declaration under Section 564(b)(1) of the Act, 21 U.S.C. section 360bbb-3(b)(1), unless the authorization is terminated or revoked.     Resp Syncytial Virus by PCR NEGATIVE NEGATIVE Final    Comment: (NOTE) Fact Sheet for Patients: BloggerCourse.com  Fact Sheet for Healthcare Providers: SeriousBroker.it  This test is not yet approved or cleared by the [place] FDA and has been authorized for detection and/or diagnosis of SARS-CoV-2 by FDA under an Emergency Use Authorization (EUA). This EUA will remain in effect (meaning this test can be used) for the duration of the COVID-19 declaration under Section 564(b)(1) of the Act, 21 U.S.C. section 360bbb-3(b)(1), unless the authorization is terminated or revoked.  Performed at [facility], [address] [place], [place] [address]   MRSA Next Gen by PCR, Nasal     Status: None   Collection Time: 05/11/23  5:01 AM   Specimen: Nasal  Mucosa; Nasal Swab  Result Value Ref Range Status   MRSA by PCR Next Gen NOT DETECTED NOT DETECTED Final    Comment: (NOTE) The GeneXpert MRSA Assay (FDA approved for NASAL specimens only), is one component of a comprehensive MRSA colonization surveillance program. It is not intended to diagnose MRSA infection nor to guide or monitor treatment for MRSA infections. Test performance is not FDA approved in patients less than 48 years old. Performed at [facility], [address] [place], [place] [address]   Culture, blood (Routine X 2) w Reflex to ID Panel     Status: None   Collection Time: 05/11/23 10:16 AM   Specimen: BLOOD  Result Value Ref Range  Status   Specimen Description BLOOD BLOOD LEFT HAND  Final   Special Requests   Final    BOTTLES DRAWN AEROBIC AND ANAEROBIC Blood Culture results may not be optimal due to an inadequate volume of blood received in culture bottles   Culture   Final    NO GROWTH 5 DAYS Performed at [facility], [address] [place], [place] [address]    Report Status 05/16/2023 FINAL  Final  Culture, blood (Routine X 2) w Reflex to ID Panel     Status: None   Collection Time: 05/11/23 10:16 AM   Specimen: BLOOD  Result Value Ref Range Status   Specimen Description BLOOD BLOOD RIGHT HAND  Final   Special Requests   Final    BOTTLES DRAWN AEROBIC AND ANAEROBIC Blood Culture results may not be optimal due to an inadequate volume of blood received in culture bottles   Culture   Final    NO GROWTH 5 DAYS Performed at [facility], [address] [place], [place] [address]    Report Status 05/16/2023 FINAL  Final    Procedures and diagnostic studies:  [place] OR NERVE BLOCK-IMAGE ONLY [facility]) Result Date: 05/16/2023 There is no interpretation for this exam.  This order is for images obtained during a surgical procedure.  Please See "Surgeries" Tab for more information regarding the procedure.               LOS: 7 days   Waunetta Riggle  [facility]   Pager on [email or web address]. If 7PM-7AM, please contact night-coverage at [email or web address]     05/17/2023, 2:51 PM

## 2023-05-18 DIAGNOSIS — M86172 Other acute osteomyelitis, left ankle and foot: Secondary | ICD-10-CM | POA: Diagnosis not present

## 2023-05-18 LAB — BASIC METABOLIC PANEL
Anion gap: 8 (ref 5–15)
BUN: 10 mg/dL (ref 8–23)
CO2: 21 mmol/L — ABNORMAL LOW (ref 22–32)
Calcium: 8.1 mg/dL — ABNORMAL LOW (ref 8.9–10.3)
Chloride: 108 mmol/L (ref 98–111)
Creatinine, Ser: 1.05 mg/dL (ref 0.61–1.24)
GFR, Estimated: >60 mL/min (ref >60)
Glucose, Bld: 172 mg/dL — ABNORMAL HIGH (ref 70–99)
Potassium: 3.7 mmol/L (ref 3.5–5.1)
Sodium: 137 mmol/L (ref 135–145)

## 2023-05-18 LAB — CBC
HCT: 28.1 % — ABNORMAL LOW (ref 39.0–52.0)
Hemoglobin: 9.4 g/dL — ABNORMAL LOW (ref 13.0–17.0)
MCH: 28.2 pg (ref 26.0–34.0)
MCHC: 33.5 g/dL (ref 30.0–36.0)
MCV: 84.4 fL (ref 80.0–100.0)
Platelets: 242 10*3/uL (ref 150–400)
RBC: 3.33 MIL/uL — ABNORMAL LOW (ref 4.22–5.81)
RDW: 14.1 % (ref 11.5–15.5)
WBC: 7.8 10*3/uL (ref 4.0–10.5)
nRBC: 0 % (ref 0.0–0.2)

## 2023-05-18 LAB — GLUCOSE, CAPILLARY
Glucose-Capillary: 201 mg/dL — ABNORMAL HIGH (ref 70–99)
Glucose-Capillary: 234 mg/dL — ABNORMAL HIGH (ref 70–99)
Glucose-Capillary: 137 mg/dL — ABNORMAL HIGH (ref 70–99)
Glucose-Capillary: 137 mg/dL — ABNORMAL HIGH (ref 70–99)
Glucose-Capillary: 145 mg/dL — ABNORMAL HIGH (ref 70–99)

## 2023-05-18 NOTE — TOC Progression Note (Addendum)
Transition of Care [facility]) - Progression Note    Patient Details  Name: Andrew Harding MRN: [id number] Date of Birth: [birth year]  Transition of Care [facility] CM/SW Contact  Chapman Fitch, RN Phone Number: 05/18/2023, 4:44 PM  Clinical Narrative:     Bed offer presented to patient Patient accepted [facility]  Accepted in HUB, [facility]  Message sent to MD to determine when patient will medically be stable for discharge in order for facility to start auth  Update:  Per MD anticipated patient will be ready for dc 1/19.  Requested for [facility] to start auth   Expected Discharge Plan: Skilled Nursing Facility Barriers to Discharge: Continued Medical Work up  Expected Discharge Plan and Services     Post Acute Care Choice: Skilled Nursing Facility Living arrangements for the past 2 months: Single Family Home                                       Social Determinants of Health (SDOH) Interventions SDOH Screenings   Food Insecurity: No Food Insecurity (05/12/2023)  Housing: Low Risk  (05/12/2023)  Transportation Needs: No Transportation Needs (05/12/2023)  Utilities: Not At Risk (05/12/2023)  Depression (PHQ2-9): Low Risk  (05/03/2023)  Financial Resource Strain: Low Risk  (12/13/2022)   Received from [facility]  Social Connections: Moderately Isolated (05/12/2023)  Tobacco Use: Medium Risk (05/16/2023)    Readmission Risk Interventions    02/28/2023   12:53 PM  Readmission Risk Prevention Plan  Transportation Screening Complete  PCP or Specialist Appt within 3-5 Days Complete  HRI or Home Care Consult Complete  Social Work Consult for Recovery Care Planning/Counseling Complete  Palliative Care Screening Not Applicable  Medication Review Oceanographer) Complete

## 2023-05-18 NOTE — Progress Notes (Signed)
Physical Therapy Treatment Patient Details Name: Andrew Harding MRN: [id number] DOB: [birth year] Today's Date: 05/18/2023   History of Present Illness Andrew Harding is a 73 y.o. male with medical history significant of insulin-dependent DM 2, A-fib on Eliquis, HTN, CAD status post CABG, CKD 3B, PAD s/p transmetatarsal amputation left foot presenting w/ sepsis, suspected LLE cellulitis vs. Osteomyelitis. 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    PT Comments  Pt in bed on arrival, awake, ready for PT. Breakfast minimally touched, still not taking in much for calories. Pt reports pain at 1/10, a little soreness today. No towel roll seen in bed for limb elevation. Pt knee ROM more limited this date compared to previous day. Educated pt on HEP with handout, excellent effort put forth. PT tolerates sidelying and prone exercises for needs feedback for accuracy. AMB advaned just a few feet today due to continued fatigue limits, pt appears orthostatic when AMB, will screen pressures on next visit to r/o. Pt up to chair at end of session. Pt has seemed more flat and withdrawn since surgery, albeit he remains motivated and polite, I hope he is able to make known if he is emotionally struggling after this procedures. Will continue to follow.    If plan is discharge home, recommend the following: A little help with walking and/or transfers;A little help with bathing/dressing/bathroom;Assistance with cooking/housework;Assist for transportation;Help with stairs or ramp for entrance   Can travel by private vehicle     No  Equipment Recommendations  None recommended by PT    Recommendations for Other Services       Precautions / Restrictions Precautions Precautions: Fall Restrictions LLE Weight Bearing Per Provider Order: Non weight bearing     Mobility  Bed Mobility   Bed Mobility: Supine to Sit, Rolling Rolling: Supervision   Supine to sit: Supervision     General bed mobility comments: able to roll onto side and  prone for HEP ed (tactile cues to edstablish what counts as prone and sidelying)    Transfers Overall transfer level: Needs assistance Equipment used: Rolling walker (2 wheels) Transfers: Sit to/from Stand Sit to Stand: Contact guard assist, From elevated surface           General transfer comment: no dizziness, no frank LOB    Ambulation/Gait Ambulation/Gait assistance: Contact guard assist, Supervision Gait Distance (Feet): 60 Feet Assistive device: Rolling walker (2 wheels)   Gait velocity: 0.8m/s     General Gait Details: safe use of RW; fatigued, hop gait; yawning after AMB similar to previous day I suspect due to orthostatic hypotension with AMB   Stairs             Wheelchair Mobility     Tilt Bed    Modified Rankin (Stroke Patients Only)       Balance                                            Cognition Arousal: Alert Behavior During Therapy: WFL for tasks assessed/performed, Flat affect                                            Exercises Amputee Exercises Quad Sets: Left, Supine, 10 reps Gluteal Sets: Left, 10 reps, Supine Hip Extension: AROM, Strengthening,  Left, 10 reps, Prone Hip ABduction/ADduction: Sidelying, AROM, Left Knee Extension: AROM, Left, 10 reps, Seated Other Exercises Other Exercises: prone Left knee flexion ROM 1x10 (ROM quit elimited in this position)    General Comments        Pertinent Vitals/Pain Pain Assessment Pain Assessment: 0-10 Pain Score: 1  Pain Location: residual limb Pain Descriptors / Indicators: Sore Pain Intervention(s): Monitored during session    Home Living                          Prior Function            PT Goals (current goals can now be found in the care plan section) Acute Rehab PT Goals Patient Stated Goal: strong recovery for transition to prosthesis use PT Goal Formulation: With patient Time For Goal Achievement:  05/31/23 Potential to Achieve Goals: Good Progress towards PT goals: Progressing toward goals    Frequency    7X/week      PT Plan      Co-evaluation              AM-PAC PT "6 Clicks" Mobility   Outcome Measure  Help needed turning from your back to your side while in a flat bed without using bedrails?: A Little Help needed moving from lying on your back to sitting on the side of a flat bed without using bedrails?: A Little Help needed moving to and from a bed to a chair (including a wheelchair)?: A Little Help needed standing up from a chair using your arms (e.g., wheelchair or bedside chair)?: A Little Help needed to walk in hospital room?: A Little Help needed climbing 3-5 steps with a railing? : A Lot 6 Click Score: 17    End of Session Equipment Utilized During Treatment: Gait belt Activity Tolerance: Patient tolerated treatment well;Patient limited by fatigue Patient left: in chair;with call bell/phone within reach;with nursing/sitter in room Nurse Communication: Mobility status PT Visit Diagnosis: Unsteadiness on feet (R26.81);Difficulty in walking, not elsewhere classified (R26.2);Other abnormalities of gait and mobility (R26.89)     Time: 0865-7846 PT Time Calculation (min) (ACUTE ONLY): 25 min  Charges:    $Gait Training: 8-22 mins $Therapeutic Exercise: 8-22 mins PT General Charges $$ ACUTE PT VISIT: 1 Visit                    12:20 PM, 05/18/23 Rosamaria Lints, PT, DPT Physical Therapist - [facility]  [phone] (ASCOM)    Tanveer Brammer C 05/18/2023, 12:17 PM

## 2023-05-18 NOTE — Progress Notes (Addendum)
Progress Note    [name]  ZOX:[id number] DOB: 1950/11/20  DOA: 05/10/2023 PCP: Lynnea Ferrier, MD      Brief Narrative:    Medical records reviewed and are as summarized below:  Andrew Harding is a 73 y.o. male with medical history significant of insulin-dependent DM 2, A-fib on Eliquis, HTN, CAD status post CABG, CKD 3B, PAD s/p transmetatarsal amputation left foot presenting w/ sepsis, suspected LLE cellulitis vs. Osteomyelitis. Pt reports worsening pain over L foot for multiple weeks.  Noted to have been admitted 03/2023 for issues including MSSA bacteremia, diabetic foot infection.Has had outpt follow up w/ ID and podiatry since discharge.    Noted recent evaluation w/ ID  cultures showing enterobacter, klebsiella and MSSA in the recent culture taken from the lateral wound- transitioned from cefadroxil to cipro. No reported medical noncompliance. Had follow up with podiatry 05/03/2023 w/ plan for MRI and vascular referral.    On presentation patient was febrile at 100.5, heart rate 100s, WBC 10.8, sodium 127, creatinine 1.42, glucose 242, lactic acid 2.3>>1.1.  DG left foot with soft tissue swelling.  Chest x-ray normal.  Procalcitonin 0.48. Vascular surgery and podiatry was consulted. Started on broad-spectrum antibiotics. MRI of the left foot was concerning for osteomyelitis.      Assessment/Plan:   Active Problems:   Sepsis (HCC)   Diabetic foot infection with possible osteomyelitis, left  (HCC)   Atrial fibrillation (HCC)   Benign essential hypertension   CKD (chronic kidney disease) stage 3, GFR 30-59 ml/min (HCC)   Coronary artery disease   Mixed hyperlipidemia   Type 2 diabetes mellitus with stage 3b chronic kidney disease, with long-term current use of insulin (HCC)   Open wound of left foot with complication   Gram-negative bacteremia    Body mass index is 27.83 kg/m.    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  Analgesics as needed for pain.   Continue IV cefepime through 05/20/2023.      Transient hypotension Resolved    Hypokalemia Improved    Atrial fibrillation (HCC) Rate controlled. Continue Eliquis and metoprolol     Type 2 diabetes mellitus with hyperglycemia Resume Semglee NovoLog as needed for hyperglycemia.     Comorbidities include CAD s/p CABG, hypertension, mixed hyperlipidemia, CKD stage IIIa, anemia of chronic disease    He is medically stable for discharge.  Awaiting placement to SNF.       Diet Order             Diet Carb Modified Fluid consistency: Thin; Room service appropriate? Yes  Diet effective now                            Consultants: ID specialist Vascular surgeon  Procedures: Aortogram and selective lower extremity angiogram, percutaneous transluminal angioplasty of left anterior tibial artery on 05/14/2023    Medications:    apixaban  5 mg Oral BID   insulin aspart  0-15 Units Subcutaneous TID WC   insulin aspart  0-5 Units Subcutaneous QHS   insulin glargine-yfgn  22 Units Subcutaneous Daily   metoprolol tartrate  25 mg Oral BID   Continuous Infusions:  ceFEPime (MAXIPIME) IV 2 g (05/18/23 0521)     Anti-infectives (From admission, onward)  Start     Dose/Rate Route Frequency Ordered Stop   05/16/23 1030  ceFAZolin (ANCEF) IVPB 2g/100 mL premix        2 g 200 mL/hr over 30 Minutes Intravenous 30 min pre-op 05/15/23 2150 05/16/23 1115   05/12/23 1400  ceFEPIme (MAXIPIME) 2 g in sodium chloride 0.9 % 100 mL IVPB        2 g 200 mL/hr over 30 Minutes Intravenous Every 8 hours 05/12/23 1318     05/11/23 1300  vancomycin (VANCOREADY) IVPB 1750 mg/350 mL  Status:  Discontinued        1,750 mg 175 mL/hr over 120 Minutes  Intravenous Every 24 hours 05/10/23 1208 05/11/23 1537   05/11/23 1231  ceFAZolin (ANCEF) IVPB 2g/100 mL premix  Status:  Discontinued        2 g 200 mL/hr over 30 Minutes Intravenous 30 min pre-op 05/11/23 1231 05/11/23 1423   05/10/23 2300  ceFEPIme (MAXIPIME) 2 g in sodium chloride 0.9 % 100 mL IVPB  Status:  Discontinued        2 g 200 mL/hr over 30 Minutes Intravenous Every 12 hours 05/10/23 1208 05/12/23 1318   05/10/23 2000  metroNIDAZOLE (FLAGYL) IVPB 500 mg        500 mg 100 mL/hr over 60 Minutes Intravenous Every 12 hours 05/10/23 1145 05/17/23 1009   05/10/23 1100  ceFEPIme (MAXIPIME) 2 g in sodium chloride 0.9 % 100 mL IVPB        2 g 200 mL/hr over 30 Minutes Intravenous  Once 05/10/23 1050 05/10/23 1141   05/10/23 1100  vancomycin (VANCOREADY) IVPB 2000 mg/400 mL        2,000 mg 200 mL/hr over 120 Minutes Intravenous  Once 05/10/23 1050 05/10/23 1528              Family Communication/Anticipated D/C date and plan/Code Status   DVT prophylaxis:  apixaban (ELIQUIS) tablet 5 mg     Code Status: Full Code  Family Communication: Plan discussed with his wife at the bedside Disposition Plan: Plan to discharge to home versus SNF   Status is: Inpatient Remains inpatient appropriate because: Left foot infection       Subjective:   Interval events noted.  He complains of mild soreness at the left BKA stump wound.  No other complaints.  No shortness of breath, chest pain or dizziness.  He feels better.  He sat up in the chair for about 1 hour this morning.  His wife was at the bedside.  Objective:    Vitals:   05/17/23 2036 05/17/23 2114 05/18/23 0343 05/18/23 0940  BP: 130/70 117/66 109/68 109/72  Pulse: 88 96 73 69  Resp: 18  16 17   Temp: 98.7 F (37.1 C)  98.3 F (36.8 C) 98.2 F (36.8 C)  TempSrc:   Oral Oral  SpO2: 99%  96% 97%  Weight:      Height:       No data found.   Intake/Output Summary (Last 24 hours) at 05/18/2023 1157 Last data  filed at 05/18/2023 1100 Gross per 24 hour  Intake 0 ml  Output 550 ml  Net -550 ml   Filed Weights   05/10/23 0931 05/16/23 1103  Weight: 101 kg 101 kg    Exam:  GEN: NAD SKIN: Warm and dry EYES: No pallor or icterus ENT: MMM CV: RRR PULM: CTA B ABD: soft, ND, NT, +BS CNS: AAO x 3, non focal EXT: Dressing on left BKA stump  wound is clean, dry and intact     Data Reviewed:   I have personally reviewed following labs and imaging studies:  Labs: Labs show the following:   Basic Metabolic Panel: Recent Labs  Lab 05/12/23 0449 05/17/23 0535 05/18/23 0720  NA 135 136 137  K 3.6 3.4* 3.7  CL 105 109 108  CO2 19* 19* 21*  GLUCOSE 148* 172* 172*  BUN 15 9 10   CREATININE 1.21 1.13 1.05  CALCIUM 8.5* 8.0* 8.1*  MG  --  1.8  --    GFR Estimated Creatinine Clearance: 74.9 mL/min (by C-G formula based on SCr of 1.05 mg/dL). Liver Function Tests: No results for input(s): "AST", "ALT", "ALKPHOS", "BILITOT", "PROT", "ALBUMIN" in the last 168 hours.  No results for input(s): "LIPASE", "AMYLASE" in the last 168 hours. No results for input(s): "AMMONIA" in the last 168 hours. Coagulation profile No results for input(s): "INR", "PROTIME" in the last 168 hours.   CBC: Recent Labs  Lab 05/12/23 0449 05/17/23 0535 05/18/23 0720  WBC 4.6 8.5 7.8  NEUTROABS  --  7.1  --   HGB 10.6* 9.7* 9.4*  HCT 31.1* 29.3* 28.1*  MCV 83.6 86.7 84.4  PLT 228 252 242   Cardiac Enzymes: No results for input(s): "CKTOTAL", "CKMB", "CKMBINDEX", "TROPONINI" in the last 168 hours. BNP (last 3 results) No results for input(s): "PROBNP" in the last 8760 hours. CBG: Recent Labs  Lab 05/17/23 1029 05/17/23 1233 05/17/23 1603 05/17/23 2110 05/18/23 0914  GLUCAP 163* 211* 283* 196* 201*   D-Dimer: No results for input(s): "DDIMER" in the last 72 hours. Hgb A1c: No results for input(s): "HGBA1C" in the last 72 hours. Lipid Profile: No results for input(s): "CHOL", "HDL",  "LDLCALC", "TRIG", "CHOLHDL", "LDLDIRECT" in the last 72 hours. Thyroid function studies: No results for input(s): "TSH", "T4TOTAL", "T3FREE", "THYROIDAB" in the last 72 hours.  Invalid input(s): "FREET3" Anemia work up: No results for input(s): "VITAMINB12", "FOLATE", "FERRITIN", "TIBC", "IRON", "RETICCTPCT" in the last 72 hours. Sepsis Labs: Recent Labs  Lab 05/12/23 0449 05/17/23 0535 05/18/23 0720  WBC 4.6 8.5 7.8    Microbiology Recent Results (from the past 240 hours)  Culture, blood (Routine x 2)     Status: Abnormal   Collection Time: 05/10/23  9:38 AM   Specimen: BLOOD  Result Value Ref Range Status   Specimen Description   Final    BLOOD RIGHT ANTECUBITAL Performed at [facility], [address] [place], [place] [address]    Special Requests   Final    BOTTLES DRAWN AEROBIC AND ANAEROBIC Blood Culture results may not be optimal due to an inadequate volume of blood received in culture bottles Performed at [facility], [address] [place], [place] [address]    Culture  Setup Time   Final    GRAM NEGATIVE RODS IN BOTH AEROBIC AND ANAEROBIC BOTTLES CRITICAL RESULT CALLED TO, READ BACK BY AND VERIFIED WITH: NATHAN BELEUE @0252  ON 05/11/23 SKL Performed at [facility], [address] [place], [place] [address]    Culture [facility] MORGANII (A)  Final   Report Status 05/13/2023 FINAL  Final   Organism ID, Bacteria MORGANELLA MORGANII  Final      Susceptibility   Morganella morganii - MIC*    AMPICILLIN >=32 RESISTANT Resistant     CEFTAZIDIME <=1 SENSITIVE Sensitive     CIPROFLOXACIN <=0.25 SENSITIVE Sensitive     GENTAMICIN <=1 SENSITIVE Sensitive     IMIPENEM 4 SENSITIVE  Sensitive     TRIMETH/SULFA <=20 SENSITIVE Sensitive     AMPICILLIN/SULBACTAM 16 INTERMEDIATE Intermediate     PIP/TAZO <=4 SENSITIVE Sensitive ug/mL    * MORGANELLA MORGANII  Blood Culture ID Panel (Reflexed)     Status: Abnormal   Collection Time: 05/10/23   9:38 AM  Result Value Ref Range Status   Enterococcus faecalis NOT DETECTED NOT DETECTED Final   Enterococcus Faecium NOT DETECTED NOT DETECTED Final   Listeria monocytogenes NOT DETECTED NOT DETECTED Final   Staphylococcus species NOT DETECTED NOT DETECTED Final   Staphylococcus aureus (BCID) NOT DETECTED NOT DETECTED Final   Staphylococcus epidermidis NOT DETECTED NOT DETECTED Final   Staphylococcus lugdunensis NOT DETECTED NOT DETECTED Final   Streptococcus species NOT DETECTED NOT DETECTED Final   Streptococcus agalactiae NOT DETECTED NOT DETECTED Final   Streptococcus pneumoniae NOT DETECTED NOT DETECTED Final   Streptococcus pyogenes NOT DETECTED NOT DETECTED Final   A.calcoaceticus-baumannii NOT DETECTED NOT DETECTED Final   Bacteroides fragilis NOT DETECTED NOT DETECTED Final   Enterobacterales DETECTED (A) NOT DETECTED Final    Comment: Enterobacterales represent a large order of gram negative bacteria, not a single organism. Refer to culture for further identification. CRITICAL RESULT CALLED TO, READ BACK BY AND VERIFIED WITH: NATHAN BELEUE @0252  ON 05/11/23 SKL    Enterobacter cloacae complex NOT DETECTED NOT DETECTED Final   Escherichia coli NOT DETECTED NOT DETECTED Final   Klebsiella aerogenes NOT DETECTED NOT DETECTED Final   Klebsiella oxytoca NOT DETECTED NOT DETECTED Final   Klebsiella pneumoniae NOT DETECTED NOT DETECTED Final   Proteus species NOT DETECTED NOT DETECTED Final   Salmonella species NOT DETECTED NOT DETECTED Final   Serratia marcescens NOT DETECTED NOT DETECTED Final   Haemophilus influenzae NOT DETECTED NOT DETECTED Final   Neisseria meningitidis NOT DETECTED NOT DETECTED Final   Pseudomonas aeruginosa NOT DETECTED NOT DETECTED Final   Stenotrophomonas maltophilia NOT DETECTED NOT DETECTED Final   Candida albicans NOT DETECTED NOT DETECTED Final   Candida auris NOT DETECTED NOT DETECTED Final   Candida glabrata NOT DETECTED NOT DETECTED Final    Candida krusei NOT DETECTED NOT DETECTED Final   Candida parapsilosis NOT DETECTED NOT DETECTED Final   Candida tropicalis NOT DETECTED NOT DETECTED Final   Cryptococcus neoformans/gattii NOT DETECTED NOT DETECTED Final   CTX-M ESBL NOT DETECTED NOT DETECTED Final   Carbapenem resistance IMP NOT DETECTED NOT DETECTED Final   Carbapenem resistance KPC NOT DETECTED NOT DETECTED Final   Carbapenem resistance NDM NOT DETECTED NOT DETECTED Final   Carbapenem resist OXA 48 LIKE NOT DETECTED NOT DETECTED Final   Carbapenem resistance VIM NOT DETECTED NOT DETECTED Final    Comment: Performed at [facility], [address] [place], [place] [address]  Culture, blood (Routine x 2)     Status: Abnormal   Collection Time: 05/10/23  9:39 AM   Specimen: BLOOD  Result Value Ref Range Status   Specimen Description   Final    BLOOD LEFT ANTECUBITAL Performed at [facility], [address] [place], [place] [address]    Special Requests   Final    BOTTLES DRAWN AEROBIC AND ANAEROBIC Blood Culture results may not be optimal due to an inadequate volume of blood received in culture bottles Performed at [facility], [address] [place], [place] [address]    Culture  Setup Time   Final    GRAM NEGATIVE RODS IN BOTH AEROBIC AND ANAEROBIC BOTTLES CRITICAL RESULT CALLED  TO, READ BACK BY AND VERIFIED WITHDawayne Cirri @ [phone] 05/12/23 BGH Performed at [facility], [address] [place], [place] [address]    Culture (A)  Final    MORGANELLA MORGANII SUSCEPTIBILITIES PERFORMED ON PREVIOUS CULTURE WITHIN THE LAST 5 DAYS. Performed at [facility], [address], [place], [place] [address]    Report Status 05/14/2023 FINAL  Final  Resp panel by RT-PCR (RSV, Flu A&B, Covid) Anterior Nasal Swab     Status: None   Collection Time: 05/10/23 10:21 AM   Specimen: Anterior Nasal Swab  Result Value Ref Range Status   SARS Coronavirus 2 by RT PCR NEGATIVE  NEGATIVE Final    Comment: (NOTE) SARS-CoV-2 target nucleic acids are NOT DETECTED.  The SARS-CoV-2 RNA is generally detectable in upper respiratory specimens during the acute phase of infection. The lowest concentration of SARS-CoV-2 viral copies this assay can detect is 138 copies/mL. A negative result does not preclude SARS-Cov-2 infection and should not be used as the sole basis for treatment or other patient management decisions. A negative result may occur with  improper specimen collection/handling, submission of specimen other than nasopharyngeal swab, presence of viral mutation(s) within the areas targeted by this assay, and inadequate number of viral copies(<138 copies/mL). A negative result must be combined with clinical observations, patient history, and epidemiological information. The expected result is Negative.  Fact Sheet for Patients:  BloggerCourse.com  Fact Sheet for Healthcare Providers:  SeriousBroker.it  This test is no t yet approved or cleared by the [place] FDA and  has been authorized for detection and/or diagnosis of SARS-CoV-2 by FDA under an Emergency Use Authorization (EUA). This EUA will remain  in effect (meaning this test can be used) for the duration of the COVID-19 declaration under Section 564(b)(1) of the Act, 21 U.S.C.section 360bbb-3(b)(1), unless the authorization is terminated  or revoked sooner.       Influenza A by PCR NEGATIVE NEGATIVE Final   Influenza B by PCR NEGATIVE NEGATIVE Final    Comment: (NOTE) The Xpert Xpress SARS-CoV-2/FLU/RSV plus assay is intended as an aid in the diagnosis of influenza from Nasopharyngeal swab specimens and should not be used as a sole basis for treatment. Nasal washings and aspirates are unacceptable for Xpert Xpress SARS-CoV-2/FLU/RSV testing.  Fact Sheet for Patients: BloggerCourse.com  Fact Sheet for Healthcare  Providers: SeriousBroker.it  This test is not yet approved or cleared by the [place] FDA and has been authorized for detection and/or diagnosis of SARS-CoV-2 by FDA under an Emergency Use Authorization (EUA). This EUA will remain in effect (meaning this test can be used) for the duration of the COVID-19 declaration under Section 564(b)(1) of the Act, 21 U.S.C. section 360bbb-3(b)(1), unless the authorization is terminated or revoked.     Resp Syncytial Virus by PCR NEGATIVE NEGATIVE Final    Comment: (NOTE) Fact Sheet for Patients: BloggerCourse.com  Fact Sheet for Healthcare Providers: SeriousBroker.it  This test is not yet approved or cleared by the [place] FDA and has been authorized for detection and/or diagnosis of SARS-CoV-2 by FDA under an Emergency Use Authorization (EUA). This EUA will remain in effect (meaning this test can be used) for the duration of the COVID-19 declaration under Section 564(b)(1) of the Act, 21 U.S.C. section 360bbb-3(b)(1), unless the authorization is terminated or revoked.  Performed at [facility], [address] [place], [place] [address]   MRSA Next Gen by PCR, Nasal     Status:  None   Collection Time: 05/11/23  5:01 AM   Specimen: Nasal Mucosa; Nasal Swab  Result Value Ref Range Status   MRSA by PCR Next Gen NOT DETECTED NOT DETECTED Final    Comment: (NOTE) The GeneXpert MRSA Assay (FDA approved for NASAL specimens only), is one component of a comprehensive MRSA colonization surveillance program. It is not intended to diagnose MRSA infection nor to guide or monitor treatment for MRSA infections. Test performance is not FDA approved in patients less than 77 years old. Performed at [facility], [address] [place], [place] [address]   Culture, blood (Routine X 2) w Reflex to ID Panel     Status: None   Collection Time:  05/11/23 10:16 AM   Specimen: BLOOD  Result Value Ref Range Status   Specimen Description BLOOD BLOOD LEFT HAND  Final   Special Requests   Final    BOTTLES DRAWN AEROBIC AND ANAEROBIC Blood Culture results may not be optimal due to an inadequate volume of blood received in culture bottles   Culture   Final    NO GROWTH 5 DAYS Performed at [facility], [address] [place], [place] [address]    Report Status 05/16/2023 FINAL  Final  Culture, blood (Routine X 2) w Reflex to ID Panel     Status: None   Collection Time: 05/11/23 10:16 AM   Specimen: BLOOD  Result Value Ref Range Status   Specimen Description BLOOD BLOOD RIGHT HAND  Final   Special Requests   Final    BOTTLES DRAWN AEROBIC AND ANAEROBIC Blood Culture results may not be optimal due to an inadequate volume of blood received in culture bottles   Culture   Final    NO GROWTH 5 DAYS Performed at [facility], [address] [place], [place] [address]    Report Status 05/16/2023 FINAL  Final    Procedures and diagnostic studies:  No results found.              LOS: 8 days   Marykathryn Carboni  [facility]   Pager on [email or web address]. If 7PM-7AM, please contact night-coverage at [email or web address]     05/18/2023, 11:57 AM

## 2023-05-18 NOTE — Plan of Care (Signed)
  Problem: Clinical Measurements: Goal: Diagnostic test results will improve Outcome: Progressing Goal: Signs and symptoms of infection will decrease Outcome: Progressing   Problem: Fluid Volume: Goal: Hemodynamic stability will improve Outcome: Progressing   Problem: Respiratory: Goal: Ability to maintain adequate ventilation will improve Outcome: Progressing   Problem: Education: Goal: Ability to describe self-care measures that may prevent or decrease complications (Diabetes Survival Skills Education) will improve Outcome: Progressing Goal: Individualized Educational Video(s) Outcome: Progressing

## 2023-05-19 DIAGNOSIS — M86172 Other acute osteomyelitis, left ankle and foot: Secondary | ICD-10-CM | POA: Diagnosis not present

## 2023-05-19 LAB — GLUCOSE, CAPILLARY
Glucose-Capillary: 150 mg/dL — ABNORMAL HIGH (ref 70–99)
Glucose-Capillary: 166 mg/dL — ABNORMAL HIGH (ref 70–99)
Glucose-Capillary: 154 mg/dL — ABNORMAL HIGH (ref 70–99)
Glucose-Capillary: 152 mg/dL — ABNORMAL HIGH (ref 70–99)

## 2023-05-19 MED ORDER — MELATONIN 5 MG PO TABS
5.0000 mg | ORAL_TABLET | Freq: Once | ORAL | Status: AC
Start: 2023-05-19 — End: 2023-05-19
  Administered 2023-05-19: 5 mg via ORAL
  Filled 2023-05-19: qty 1

## 2023-05-19 NOTE — Progress Notes (Signed)
Progress Note    SARA RUGAMA  ZOX:[id number] DOB: 06/12/1950  DOA: 05/10/2023 PCP: Lynnea Ferrier, MD      Brief Narrative:    Medical records reviewed and are as summarized below:  [name] is a 73 y.o. male with medical history significant of insulin-dependent DM 2, A-fib on Eliquis, HTN, CAD status post CABG, CKD 3B, PAD s/p transmetatarsal amputation left foot presenting w/ sepsis, suspected LLE cellulitis vs. Osteomyelitis. Pt reports worsening pain over L foot for multiple weeks.  Noted to have been admitted 03/2023 for issues including MSSA bacteremia, diabetic foot infection.Has had outpt follow up w/ ID and podiatry since discharge.    Noted recent evaluation w/ ID  cultures showing enterobacter, klebsiella and MSSA in the recent culture taken from the lateral wound- transitioned from cefadroxil to cipro. No reported medical noncompliance. Had follow up with podiatry 05/03/2023 w/ plan for MRI and vascular referral.    On presentation patient was febrile at 100.5, heart rate 100s, WBC 10.8, sodium 127, creatinine 1.42, glucose 242, lactic acid 2.3>>1.1.  DG left foot with soft tissue swelling.  Chest x-ray normal.  Procalcitonin 0.48. Vascular surgery and podiatry was consulted. Started on broad-spectrum antibiotics. MRI of the left foot was concerning for osteomyelitis.      Assessment/Plan:   Active Problems:   Sepsis (HCC)   Diabetic foot infection with possible osteomyelitis, left  (HCC)   Atrial fibrillation (HCC)   Benign essential hypertension   CKD (chronic kidney disease) stage 3, GFR 30-59 ml/min (HCC)   Coronary artery disease   Mixed hyperlipidemia   Type 2 diabetes mellitus with stage 3b chronic kidney disease, with long-term current use of insulin (HCC)   Open wound of left foot with complication   Gram-negative bacteremia    Body mass index is 27.83 kg/m.    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  Analgesics as needed for pain.   Plan to complete IV cefepime tomorrow.  Vascular surgeon will change dressing tomorrow.    Transient hypotension Resolved    Hypokalemia Improved    Atrial fibrillation (HCC) Rate controlled. Continue Eliquis and metoprolol     Type 2 diabetes mellitus with hyperglycemia Resume Semglee NovoLog as needed for hyperglycemia.     Comorbidities include CAD s/p CABG, hypertension, mixed hyperlipidemia, CKD stage IIIa, anemia of chronic disease    He is medically stable for discharge.  Awaiting placement to SNF.       Diet Order             Diet Carb Modified Fluid consistency: Thin; Room service appropriate? Yes  Diet effective now                            Consultants: ID specialist Vascular surgeon  Procedures: Aortogram and selective lower extremity angiogram, percutaneous transluminal angioplasty of left anterior tibial artery on 05/14/2023    Medications:    apixaban  5 mg Oral BID   insulin aspart  0-15 Units Subcutaneous TID WC   insulin aspart  0-5 Units Subcutaneous QHS   insulin glargine-yfgn  22 Units Subcutaneous Daily   metoprolol tartrate  25 mg Oral BID   Continuous Infusions:  ceFEPime (MAXIPIME) IV 2 g (05/19/23 0540)  Anti-infectives (From admission, onward)    Start     Dose/Rate Route Frequency Ordered Stop   05/16/23 1030  ceFAZolin (ANCEF) IVPB 2g/100 mL premix        2 g 200 mL/hr over 30 Minutes Intravenous 30 min pre-op 05/15/23 2150 05/16/23 1115   05/12/23 1400  ceFEPIme (MAXIPIME) 2 g in sodium chloride 0.9 % 100 mL IVPB        2 g 200 mL/hr over 30 Minutes Intravenous Every 8 hours 05/12/23 1318 05/20/23 2359   05/11/23 1300  vancomycin (VANCOREADY) IVPB 1750 mg/350 mL  Status:   Discontinued        1,750 mg 175 mL/hr over 120 Minutes Intravenous Every 24 hours 05/10/23 1208 05/11/23 1537   05/11/23 1231  ceFAZolin (ANCEF) IVPB 2g/100 mL premix  Status:  Discontinued        2 g 200 mL/hr over 30 Minutes Intravenous 30 min pre-op 05/11/23 1231 05/11/23 1423   05/10/23 2300  ceFEPIme (MAXIPIME) 2 g in sodium chloride 0.9 % 100 mL IVPB  Status:  Discontinued        2 g 200 mL/hr over 30 Minutes Intravenous Every 12 hours 05/10/23 1208 05/12/23 1318   05/10/23 2000  metroNIDAZOLE (FLAGYL) IVPB 500 mg        500 mg 100 mL/hr over 60 Minutes Intravenous Every 12 hours 05/10/23 1145 05/17/23 1009   05/10/23 1100  ceFEPIme (MAXIPIME) 2 g in sodium chloride 0.9 % 100 mL IVPB        2 g 200 mL/hr over 30 Minutes Intravenous  Once 05/10/23 1050 05/10/23 1141   05/10/23 1100  vancomycin (VANCOREADY) IVPB 2000 mg/400 mL        2,000 mg 200 mL/hr over 120 Minutes Intravenous  Once 05/10/23 1050 05/10/23 1528              Family Communication/Anticipated D/C date and plan/Code Status   DVT prophylaxis:  apixaban (ELIQUIS) tablet 5 mg     Code Status: Full Code  Family Communication: None Disposition Plan: Plan to discharge to SNF   Status is: Inpatient Remains inpatient appropriate because: Left foot infection       Subjective:   Interval events noted.  He has no complaints.  Objective:    Vitals:   05/18/23 1545 05/18/23 2017 05/19/23 0437 05/19/23 0821  BP: 118/73 121/67 135/76 (!) 142/68  Pulse: 82 91 71 79  Resp: 18 20 20 18   Temp: 98 F (36.7 C) 99 F (37.2 C) (!) 97.2 F (36.2 C) 98 F (36.7 C)  TempSrc:  Oral Oral   SpO2: 97% 98% 98% 98%  Weight:      Height:       No data found.   Intake/Output Summary (Last 24 hours) at 05/19/2023 1355 Last data filed at 05/19/2023 1236 Gross per 24 hour  Intake --  Output 1100 ml  Net -1100 ml   Filed Weights   05/10/23 0931 05/16/23 1103  Weight: 101 kg 101 kg    Exam:   GEN:  NAD SKIN: Warm and dry EYES: No pallor or icterus ENT: MMM CV: RRR PULM: CTA B ABD: soft, ND, NT, +BS CNS: AAO x 3, non focal EXT: Dressing on left BKA stump wound is clean, dry and intact   Data Reviewed:   I have personally reviewed following labs and imaging studies:  Labs: Labs show the following:   Basic Metabolic Panel: Recent Labs  Lab 05/17/23 0535 05/18/23 0720  NA 136 137  K 3.4* 3.7  CL 109 108  CO2 19* 21*  GLUCOSE 172* 172*  BUN 9 10  CREATININE 1.13 1.05  CALCIUM 8.0* 8.1*  MG 1.8  --    GFR Estimated Creatinine Clearance: 74.9 mL/min (by C-G formula based on SCr of 1.05 mg/dL). Liver Function Tests: No results for input(s): "AST", "ALT", "ALKPHOS", "BILITOT", "PROT", "ALBUMIN" in the last 168 hours.  No results for input(s): "LIPASE", "AMYLASE" in the last 168 hours. No results for input(s): "AMMONIA" in the last 168 hours. Coagulation profile No results for input(s): "INR", "PROTIME" in the last 168 hours.   CBC: Recent Labs  Lab 05/17/23 0535 05/18/23 0720  WBC 8.5 7.8  NEUTROABS 7.1  --   HGB 9.7* 9.4*  HCT 29.3* 28.1*  MCV 86.7 84.4  PLT 252 242   Cardiac Enzymes: No results for input(s): "CKTOTAL", "CKMB", "CKMBINDEX", "TROPONINI" in the last 168 hours. BNP (last 3 results) No results for input(s): "PROBNP" in the last 8760 hours. CBG: Recent Labs  Lab 05/18/23 1715 05/18/23 2231 05/18/23 2252 05/19/23 0820 05/19/23 1130  GLUCAP 137* 145* 137* 150* 166*   D-Dimer: No results for input(s): "DDIMER" in the last 72 hours. Hgb A1c: No results for input(s): "HGBA1C" in the last 72 hours. Lipid Profile: No results for input(s): "CHOL", "HDL", "LDLCALC", "TRIG", "CHOLHDL", "LDLDIRECT" in the last 72 hours. Thyroid function studies: No results for input(s): "TSH", "T4TOTAL", "T3FREE", "THYROIDAB" in the last 72 hours.  Invalid input(s): "FREET3" Anemia work up: No results for input(s): "VITAMINB12", "FOLATE", "FERRITIN",  "TIBC", "IRON", "RETICCTPCT" in the last 72 hours. Sepsis Labs: Recent Labs  Lab 05/17/23 0535 05/18/23 0720  WBC 8.5 7.8    Microbiology Recent Results (from the past 240 hours)  Culture, blood (Routine x 2)     Status: Abnormal   Collection Time: 05/10/23  9:38 AM   Specimen: BLOOD  Result Value Ref Range Status   Specimen Description   Final    BLOOD RIGHT ANTECUBITAL Performed at [facility], [address], [place], [place] [address]    Special Requests   Final    BOTTLES DRAWN AEROBIC AND ANAEROBIC Blood Culture results may not be optimal due to an inadequate volume of blood received in culture bottles Performed at [facility], [address], [place], [place] [address]    Culture  Setup Time   Final    GRAM NEGATIVE RODS IN BOTH AEROBIC AND ANAEROBIC BOTTLES CRITICAL RESULT CALLED TO, READ BACK BY AND VERIFIED WITH: NATHAN BELEUE @0252  ON 05/11/23 SKL Performed at [facility], [address], [place], [place] [address]    Culture [facility] MORGANII (A)  Final   Report Status 05/13/2023 FINAL  Final   Organism ID, Bacteria MORGANELLA MORGANII  Final      Susceptibility   Morganella morganii - MIC*    AMPICILLIN >=32 RESISTANT Resistant     CEFTAZIDIME <=1 SENSITIVE Sensitive     CIPROFLOXACIN <=0.25 SENSITIVE Sensitive     GENTAMICIN <=1 SENSITIVE Sensitive     IMIPENEM 4 SENSITIVE Sensitive     TRIMETH/SULFA <=20 SENSITIVE Sensitive     AMPICILLIN/SULBACTAM 16 INTERMEDIATE Intermediate     PIP/TAZO <=4 SENSITIVE Sensitive ug/mL    * MORGANELLA MORGANII  Blood Culture ID Panel (Reflexed)     Status: Abnormal   Collection Time: 05/10/23  9:38 AM  Result Value Ref Range Status   Enterococcus faecalis NOT DETECTED NOT DETECTED Final  Enterococcus Faecium NOT DETECTED NOT DETECTED Final   Listeria monocytogenes NOT DETECTED NOT DETECTED Final   Staphylococcus species NOT DETECTED NOT DETECTED Final   Staphylococcus aureus (BCID) NOT  DETECTED NOT DETECTED Final   Staphylococcus epidermidis NOT DETECTED NOT DETECTED Final   Staphylococcus lugdunensis NOT DETECTED NOT DETECTED Final   Streptococcus species NOT DETECTED NOT DETECTED Final   Streptococcus agalactiae NOT DETECTED NOT DETECTED Final   Streptococcus pneumoniae NOT DETECTED NOT DETECTED Final   Streptococcus pyogenes NOT DETECTED NOT DETECTED Final   A.calcoaceticus-baumannii NOT DETECTED NOT DETECTED Final   Bacteroides fragilis NOT DETECTED NOT DETECTED Final   Enterobacterales DETECTED (A) NOT DETECTED Final    Comment: Enterobacterales represent a large order of gram negative bacteria, not a single organism. Refer to culture for further identification. CRITICAL RESULT CALLED TO, READ BACK BY AND VERIFIED WITH: NATHAN BELEUE @0252  ON 05/11/23 SKL    Enterobacter cloacae complex NOT DETECTED NOT DETECTED Final   Escherichia coli NOT DETECTED NOT DETECTED Final   Klebsiella aerogenes NOT DETECTED NOT DETECTED Final   Klebsiella oxytoca NOT DETECTED NOT DETECTED Final   Klebsiella pneumoniae NOT DETECTED NOT DETECTED Final   Proteus species NOT DETECTED NOT DETECTED Final   Salmonella species NOT DETECTED NOT DETECTED Final   Serratia marcescens NOT DETECTED NOT DETECTED Final   Haemophilus influenzae NOT DETECTED NOT DETECTED Final   Neisseria meningitidis NOT DETECTED NOT DETECTED Final   Pseudomonas aeruginosa NOT DETECTED NOT DETECTED Final   Stenotrophomonas maltophilia NOT DETECTED NOT DETECTED Final   Candida albicans NOT DETECTED NOT DETECTED Final   Candida auris NOT DETECTED NOT DETECTED Final   Candida glabrata NOT DETECTED NOT DETECTED Final   Candida krusei NOT DETECTED NOT DETECTED Final   Candida parapsilosis NOT DETECTED NOT DETECTED Final   Candida tropicalis NOT DETECTED NOT DETECTED Final   Cryptococcus neoformans/gattii NOT DETECTED NOT DETECTED Final   CTX-M ESBL NOT DETECTED NOT DETECTED Final   Carbapenem resistance IMP NOT  DETECTED NOT DETECTED Final   Carbapenem resistance KPC NOT DETECTED NOT DETECTED Final   Carbapenem resistance NDM NOT DETECTED NOT DETECTED Final   Carbapenem resist OXA 48 LIKE NOT DETECTED NOT DETECTED Final   Carbapenem resistance VIM NOT DETECTED NOT DETECTED Final    Comment: Performed at [facility], [address] [place], [place] [address]  Culture, blood (Routine x 2)     Status: Abnormal   Collection Time: 05/10/23  9:39 AM   Specimen: BLOOD  Result Value Ref Range Status   Specimen Description   Final    BLOOD LEFT ANTECUBITAL Performed at [facility], [address] [place], [place] [address]    Special Requests   Final    BOTTLES DRAWN AEROBIC AND ANAEROBIC Blood Culture results may not be optimal due to an inadequate volume of blood received in culture bottles Performed at [facility], [address] [place], [place] [address]    Culture  Setup Time   Final    GRAM NEGATIVE RODS IN BOTH AEROBIC AND ANAEROBIC BOTTLES CRITICAL RESULT CALLED TO, READ BACK BY AND VERIFIED WITHDawayne Cirri @ [phone] 05/12/23 BGH Performed at [facility], [address] [place], [place] [address]    Culture (A)  Final    MORGANELLA MORGANII SUSCEPTIBILITIES PERFORMED ON PREVIOUS CULTURE WITHIN THE LAST 5 DAYS. Performed at [facility], [address], [place], [place] [address]    Report Status 05/14/2023 FINAL  Final  Resp panel by RT-PCR (RSV, Flu A&B, Covid) Anterior Nasal Swab     Status: None   Collection Time: 05/10/23 10:21 AM   Specimen: Anterior Nasal Swab  Result Value Ref Range Status   SARS Coronavirus 2 by RT PCR NEGATIVE NEGATIVE Final    Comment: (NOTE) SARS-CoV-2 target nucleic acids are NOT DETECTED.  The SARS-CoV-2 RNA is generally detectable in upper respiratory specimens during the acute phase of infection. The lowest concentration of SARS-CoV-2 viral copies this assay can detect is 138 copies/mL. A negative  result does not preclude SARS-Cov-2 infection and should not be used as the sole basis for treatment or other patient management decisions. A negative result may occur with  improper specimen collection/handling, submission of specimen other than nasopharyngeal swab, presence of viral mutation(s) within the areas targeted by this assay, and inadequate number of viral copies(<138 copies/mL). A negative result must be combined with clinical observations, patient history, and epidemiological information. The expected result is Negative.  Fact Sheet for Patients:  BloggerCourse.com  Fact Sheet for Healthcare Providers:  SeriousBroker.it  This test is no t yet approved or cleared by the [place] FDA and  has been authorized for detection and/or diagnosis of SARS-CoV-2 by FDA under an Emergency Use Authorization (EUA). This EUA will remain  in effect (meaning this test can be used) for the duration of the COVID-19 declaration under Section 564(b)(1) of the Act, 21 U.S.C.section 360bbb-3(b)(1), unless the authorization is terminated  or revoked sooner.       Influenza A by PCR NEGATIVE NEGATIVE Final   Influenza B by PCR NEGATIVE NEGATIVE Final    Comment: (NOTE) The Xpert Xpress SARS-CoV-2/FLU/RSV plus assay is intended as an aid in the diagnosis of influenza from Nasopharyngeal swab specimens and should not be used as a sole basis for treatment. Nasal washings and aspirates are unacceptable for Xpert Xpress SARS-CoV-2/FLU/RSV testing.  Fact Sheet for Patients: BloggerCourse.com  Fact Sheet for Healthcare Providers: SeriousBroker.it  This test is not yet approved or cleared by the [place] FDA and has been authorized for detection and/or diagnosis of SARS-CoV-2 by FDA under an Emergency Use Authorization (EUA). This EUA will remain in effect (meaning this test can be used)  for the duration of the COVID-19 declaration under Section 564(b)(1) of the Act, 21 U.S.C. section 360bbb-3(b)(1), unless the authorization is terminated or revoked.     Resp Syncytial Virus by PCR NEGATIVE NEGATIVE Final    Comment: (NOTE) Fact Sheet for Patients: BloggerCourse.com  Fact Sheet for Healthcare Providers: SeriousBroker.it  This test is not yet approved or cleared by the [place] FDA and has been authorized for detection and/or diagnosis of SARS-CoV-2 by FDA under an Emergency Use Authorization (EUA). This EUA will remain in effect (meaning this test can be used) for the duration of the COVID-19 declaration under Section 564(b)(1) of the Act, 21 U.S.C. section 360bbb-3(b)(1), unless the authorization is terminated or revoked.  Performed at [facility], [address] [place], [place] [address]   MRSA Next Gen by PCR, Nasal     Status: None   Collection Time: 05/11/23  5:01 AM   Specimen: Nasal Mucosa; Nasal Swab  Result Value Ref Range Status   MRSA by PCR Next Gen NOT DETECTED NOT DETECTED Final    Comment: (NOTE) The GeneXpert MRSA Assay (FDA approved for NASAL specimens only), is one component of a comprehensive MRSA colonization surveillance program. It is not intended to diagnose MRSA infection nor to guide  or monitor treatment for MRSA infections. Test performance is not FDA approved in patients less than 60 years old. Performed at [facility], [address] [place], [place] [address]   Culture, blood (Routine X 2) w Reflex to ID Panel     Status: None   Collection Time: 05/11/23 10:16 AM   Specimen: BLOOD  Result Value Ref Range Status   Specimen Description BLOOD BLOOD LEFT HAND  Final   Special Requests   Final    BOTTLES DRAWN AEROBIC AND ANAEROBIC Blood Culture results may not be optimal due to an inadequate volume of blood received in culture bottles   Culture   Final     NO GROWTH 5 DAYS Performed at [facility], [address] [place], [place] [address]    Report Status 05/16/2023 FINAL  Final  Culture, blood (Routine X 2) w Reflex to ID Panel     Status: None   Collection Time: 05/11/23 10:16 AM   Specimen: BLOOD  Result Value Ref Range Status   Specimen Description BLOOD BLOOD RIGHT HAND  Final   Special Requests   Final    BOTTLES DRAWN AEROBIC AND ANAEROBIC Blood Culture results may not be optimal due to an inadequate volume of blood received in culture bottles   Culture   Final    NO GROWTH 5 DAYS Performed at [facility], [address] [place], [place] [address]    Report Status 05/16/2023 FINAL  Final    Procedures and diagnostic studies:  No results found.              LOS: 9 days   Ariya Bohannon  [employer] loss adjuster on [email or web address]. If 7PM-7AM, please contact night-coverage at [email or web address]     05/19/2023, 1:55 PM

## 2023-05-19 NOTE — Progress Notes (Signed)
Physical Therapy Treatment Patient Details Name: Andrew Harding MRN: [id number] DOB: 22-Mar-1951 Today's Date: 05/19/2023   History of Present Illness Andrew Harding is a 73 y.o. male with medical history significant of insulin-dependent DM 2, A-fib on Eliquis, HTN, CAD status post CABG, CKD 3B, PAD s/p transmetatarsal amputation left foot presenting w/ sepsis, suspected LLE cellulitis vs. Osteomyelitis. 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    PT Comments  Pt was supine in bed up[on arrival educated on importance of not leaving pillow/towel roll under knee to promote HS stretching/prevent contractures. Pt states understanding and remains extremely motivated throughout. Pt was safely able to perform HEP and then exit bed, stand, and ambulate with use of RW.  Overall progressing well towards all goals. Acute PT will continue to follow and progress per current POC. DC recs remain appropriate to maximize his independence with all ADLs.   If plan is discharge home, recommend the following: A little help with walking and/or transfers;A little help with bathing/dressing/bathroom;Assistance with cooking/housework;Assist for transportation;Help with stairs or ramp for entrance     Equipment Recommendations  None recommended by PT       Precautions / Restrictions Precautions Precautions: Fall Required Braces or Orthoses: Other Brace Restrictions Weight Bearing Restrictions Per Provider Order: Yes LLE Weight Bearing Per Provider Order: Non weight bearing     Mobility  Bed Mobility Overal bed mobility: Modified Independent Bed Mobility: Supine to Sit, Sit to Supine  Supine to sit: Supervision Sit to supine: Supervision   Transfers Overall transfer level: Needs assistance Equipment used: Rolling walker (2 wheels) Transfers: Sit to/from Stand Sit to Stand: From elevated surface, Contact guard assist, Supervision  General transfer comment: no physical assistance. CGA at first that progressed to supervision on 2nd  attempt    Ambulation/Gait Ambulation/Gait assistance: Contact guard assist, Supervision Gait Distance (Feet): 50 Feet Assistive device: Rolling walker (2 wheels) Gait Pattern/deviations:  (" hop to.")  General Gait Details: Pt was able to "hop" ~ 50 ft with RW without LOB. distance limited by fatigue but overall pt is progressing well    Balance Overall balance assessment: Needs assistance Sitting-balance support: No upper extremity supported, Feet supported Sitting balance-Leahy Scale: Normal     Standing balance support: Bilateral upper extremity supported, During functional activity, Reliant on assistive device for balance Standing balance-Leahy Scale: Fair      Cognition Arousal: Alert Behavior During Therapy: WFL for tasks assessed/performed, Flat affect Overall Cognitive Status: Within Functional Limits for tasks assessed      General Comments: Pt is  A and O x 4        Exercises Amputee Exercises Quad Sets: AROM, Supine, 15 reps Gluteal Sets: AROM, 15 reps Hip Extension: AROM, Prone, Sidelying, 15 reps Hip ABduction/ADduction: Sidelying, Supine        Pertinent Vitals/Pain Pain Assessment Pain Assessment: 0-10 Pain Score: 4  Pain Location: residual limb Pain Descriptors / Indicators: Sore Pain Intervention(s): Limited activity within patient's tolerance, Monitored during session, Repositioned    \ PT Goals (current goals can now be found in the care plan section) Acute Rehab PT Goals Patient Stated Goal: rehab then home Progress towards PT goals: Progressing toward goals    Frequency    7X/week       AM-PAC PT "6 Clicks" Mobility   Outcome Measure  Help needed turning from your back to your side while in a flat bed without using bedrails?: A Little Help needed moving from lying on your back to sitting on  the side of a flat bed without using bedrails?: A Little Help needed moving to and from a bed to a chair (including a wheelchair)?: A  Little Help needed standing up from a chair using your arms (e.g., wheelchair or bedside chair)?: A Little Help needed to walk in hospital room?: A Little Help needed climbing 3-5 steps with a railing? : A Lot 6 Click Score: 17    End of Session   Activity Tolerance: Patient tolerated treatment well;Patient limited by fatigue Patient left: in chair;with call bell/phone within reach;with nursing/sitter in room Nurse Communication: Mobility status PT Visit Diagnosis: Unsteadiness on feet (R26.81);Difficulty in walking, not elsewhere classified (R26.2);Other abnormalities of gait and mobility (R26.89)     Time: 9604-5409 PT Time Calculation (min) (ACUTE ONLY): 23 min  Charges:    $Gait Training: 8-22 mins $Therapeutic Exercise: 8-22 mins PT General Charges $$ ACUTE PT VISIT: 1 Visit                     Jetta Lout PTA 05/19/23, 1:37 PM

## 2023-05-19 NOTE — Progress Notes (Signed)
3 Days Post-Op   Subjective/Chief Complaint: Doing well. Denies pain.  Tolerating  OOB with PT. Otherwise without complaint   Objective: Vital signs in last 24 hours: Temp:  [97.2 F (36.2 C)-99 F (37.2 C)] 98 F (36.7 C) (01/18 0821) Pulse Rate:  [69-91] 79 (01/18 0821) Resp:  [17-20] 18 (01/18 0821) BP: (109-142)/(67-76) 142/68 (01/18 0821) SpO2:  [97 %-98 %] 98 % (01/18 0821) Last BM Date : 05/18/23  Intake/Output from previous day: 01/17 0701 - 01/18 0700 In: 0  Out: 550 [Urine:550] Intake/Output this shift: No intake/output data recorded.  General appearance: cooperative and no distress Cardio: regular rate and rhythm Extremities: LEFT BKA stump- dressing C/D/I, thigh soft  Lab Results:  Recent Labs    05/17/23 0535 05/18/23 0720  WBC 8.5 7.8  HGB 9.7* 9.4*  HCT 29.3* 28.1*  PLT 252 242   BMET Recent Labs    05/17/23 0535 05/18/23 0720  NA 136 137  K 3.4* 3.7  CL 109 108  CO2 19* 21*  GLUCOSE 172* 172*  BUN 9 10  CREATININE 1.13 1.05  CALCIUM 8.0* 8.1*   PT/INR No results for input(s): "LABPROT", "INR" in the last 72 hours. ABG No results for input(s): "PHART", "HCO3" in the last 72 hours.  Invalid input(s): "PCO2", "PO2"  Studies/Results: No results found.  Anti-infectives: Anti-infectives (From admission, onward)    Start     Dose/Rate Route Frequency Ordered Stop   05/16/23 1030  ceFAZolin (ANCEF) IVPB 2g/100 mL premix        2 g 200 mL/hr over 30 Minutes Intravenous 30 min pre-op 05/15/23 2150 05/16/23 1115   05/12/23 1400  ceFEPIme (MAXIPIME) 2 g in sodium chloride 0.9 % 100 mL IVPB        2 g 200 mL/hr over 30 Minutes Intravenous Every 8 hours 05/12/23 1318 05/20/23 2359   05/11/23 1300  vancomycin (VANCOREADY) IVPB 1750 mg/350 mL  Status:  Discontinued        1,750 mg 175 mL/hr over 120 Minutes Intravenous Every 24 hours 05/10/23 1208 05/11/23 1537   05/11/23 1231  ceFAZolin (ANCEF) IVPB 2g/100 mL premix  Status:  Discontinued         2 g 200 mL/hr over 30 Minutes Intravenous 30 min pre-op 05/11/23 1231 05/11/23 1423   05/10/23 2300  ceFEPIme (MAXIPIME) 2 g in sodium chloride 0.9 % 100 mL IVPB  Status:  Discontinued        2 g 200 mL/hr over 30 Minutes Intravenous Every 12 hours 05/10/23 1208 05/12/23 1318   05/10/23 2000  metroNIDAZOLE (FLAGYL) IVPB 500 mg        500 mg 100 mL/hr over 60 Minutes Intravenous Every 12 hours 05/10/23 1145 05/17/23 1009   05/10/23 1100  ceFEPIme (MAXIPIME) 2 g in sodium chloride 0.9 % 100 mL IVPB        2 g 200 mL/hr over 30 Minutes Intravenous  Once 05/10/23 1050 05/10/23 1141   05/10/23 1100  vancomycin (VANCOREADY) IVPB 2000 mg/400 mL        2,000 mg 200 mL/hr over 120 Minutes Intravenous  Once 05/10/23 1050 05/10/23 1528       Assessment/Plan: s/p Procedure(s): AMPUTATION BELOW KNEE (Left) POD # 3 Will change dressing tomorrow OK for discharge tomorrow after dressing change OOB with PT  Pain control  LOS: 9 days    Eli Hose A 05/19/2023

## 2023-05-20 DIAGNOSIS — M86172 Other acute osteomyelitis, left ankle and foot: Secondary | ICD-10-CM | POA: Diagnosis not present

## 2023-05-20 LAB — GLUCOSE, CAPILLARY
Glucose-Capillary: 166 mg/dL — ABNORMAL HIGH (ref 70–99)
Glucose-Capillary: 153 mg/dL — ABNORMAL HIGH (ref 70–99)
Glucose-Capillary: 131 mg/dL — ABNORMAL HIGH (ref 70–99)
Glucose-Capillary: 154 mg/dL — ABNORMAL HIGH (ref 70–99)

## 2023-05-20 MED ORDER — TRAZODONE HCL 50 MG PO TABS
50.0000 mg | ORAL_TABLET | Freq: Every evening | ORAL | Status: AC | PRN
  Administered 2023-05-20 – 2023-05-22 (×3): 50 mg via ORAL
  Filled 2023-05-20 (×3): qty 1

## 2023-05-20 MED ORDER — ORAL CARE MOUTH RINSE: 15.0000 mL | OROMUCOSAL | Status: DC | PRN

## 2023-05-20 NOTE — Progress Notes (Signed)
Physical Therapy Treatment Patient Details Name: Andrew Harding MRN: [id number] DOB: 1951-01-27 Today's Date: 05/20/2023   History of Present Illness Andrew Harding is a 73 y.o. male with medical history significant of insulin-dependent DM 2, A-fib on Eliquis, HTN, CAD status post CABG, CKD 3B, PAD s/p transmetatarsal amputation left foot presenting w/ sepsis, suspected LLE cellulitis vs. Osteomyelitis. 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    PT Comments  Pt received in bed with a vistor and eating lunch. Pt pleasant, motivated and compliant. PT completed reviewing Exs as per Hand out. Today pt's dressing was changed. PT spent time communicating with Nurse and Dr. Evie Lacks to provide pt with a shrinker 1 hr on and 1-2 hrs off. Dr. Evie Lacks ordered the shrinker. PT also communicated with Nurses regarding ambulating in room to bathroom using FWW and about the shrinker schedule. . PT will continue in acute and current POC remains appropriate.    If plan is discharge home, recommend the following: A lot of help with bathing/dressing/bathroom;A little help with walking and/or transfers;Assistance with cooking/housework;Assist for transportation;Help with stairs or ramp for entrance   Can travel by private vehicle        Equipment Recommendations  None recommended by PT    Recommendations for Other Services       Precautions / Restrictions Precautions Precautions: Fall Required Braces or Orthoses: Other Brace Restrictions Weight Bearing Restrictions Per Provider Order: Yes LLE Weight Bearing Per Provider Order: Non weight bearing     Mobility  Bed Mobility Overal bed mobility: Modified Independent Bed Mobility: Supine to Sit, Sit to Supine Rolling: Supervision   Supine to sit: Supervision Sit to supine: Supervision        Transfers                   General transfer comment: deferred, pt wanted to eat and enjoy his company.    Ambulation/Gait: deferred                    Stairs              Wheelchair Mobility     Tilt Bed    Modified Rankin (Stroke Patients Only)       Balance                                            Cognition Arousal: Alert Behavior During Therapy: WFL for tasks assessed/performed Overall Cognitive Status: Within Functional Limits for tasks assessed                                 General Comments: Pt is  A and O x 4        Exercises Amputee Exercises Quad Sets: AROM, Supine, 15 reps Gluteal Sets: AROM, 15 reps Hip Extension: AROM, Prone, Sidelying, 15 reps Hip ABduction/ADduction: Sidelying, Supine Hip Flexion/Marching: AROM, Left, 10 reps    General Comments        Pertinent Vitals/Pain Pain Assessment Pain Assessment: No/denies pain    Home Living                          Prior Function            PT Goals (current goals can now be found in the  care plan section) Acute Rehab PT Goals Patient Stated Goal: rehab then home Progress towards PT goals: Progressing toward goals    Frequency    7X/week      PT Plan      Co-evaluation              AM-PAC PT "6 Clicks" Mobility   Outcome Measure  Help needed turning from your back to your side while in a flat bed without using bedrails?: A Little Help needed moving from lying on your back to sitting on the side of a flat bed without using bedrails?: A Little Help needed moving to and from a bed to a chair (including a wheelchair)?: A Little Help needed standing up from a chair using your arms (e.g., wheelchair or bedside chair)?: A Little Help needed to walk in hospital room?: A Little Help needed climbing 3-5 steps with a railing? : Total 6 Click Score: 16    End of Session   Activity Tolerance: Patient tolerated treatment well;Patient limited by fatigue Patient left: in bed Nurse Communication: Mobility status PT Visit Diagnosis: Unsteadiness on feet (R26.81);Difficulty in walking, not elsewhere  classified (R26.2);Other abnormalities of gait and mobility (R26.89)     Time: 0981-1914 PT Time Calculation (min) (ACUTE ONLY): 25 min  Charges:    $Therapeutic Exercise: 8-22 mins $Therapeutic Activity: 8-22 mins PT General Charges $$ ACUTE PT VISIT: 1 Visit          Janet Berlin PT DPT 1:13 PM,05/20/23

## 2023-05-20 NOTE — Progress Notes (Signed)
Orthopedic Tech Progress Note Patient Details:  GRYPHON MAUTE 29-Dec-1950 [id number]  Order for a shrinker for the LLE called into [facility].  Patient ID: Andrew Harding, male   DOB: 1950-08-28, 73 y.o.   MRN: [id number]  Docia Furl 05/20/2023, 5:57 PM

## 2023-05-20 NOTE — Progress Notes (Addendum)
Progress Note    Andrew Harding  QVZ:[id number] DOB: January 22, 1951  DOA: 05/10/2023 PCP: Lynnea Ferrier, MD      Brief Narrative:    Medical records reviewed and are as summarized below:  Andrew Harding is a 73 y.o. male with medical history significant of insulin-dependent DM 2, A-fib on Eliquis, HTN, CAD status post CABG, CKD 3B, PAD s/p transmetatarsal amputation left foot presenting w/ sepsis, suspected LLE cellulitis vs. Osteomyelitis. Pt reports worsening pain over L foot for multiple weeks.  Noted to have been admitted 03/2023 for issues including MSSA bacteremia, diabetic foot infection.Has had outpt follow up w/ ID and podiatry since discharge.    Noted recent evaluation w/ ID  cultures showing enterobacter, klebsiella and MSSA in the recent culture taken from the lateral wound- transitioned from cefadroxil to cipro. No reported medical noncompliance. Had follow up with podiatry 05/03/2023 w/ plan for MRI and vascular referral.    On presentation patient was febrile at 100.5, heart rate 100s, WBC 10.8, sodium 127, creatinine 1.42, glucose 242, lactic acid 2.3>>1.1.  DG left foot with soft tissue swelling.  Chest x-ray normal.  Procalcitonin 0.48. Vascular surgery and podiatry was consulted. Started on broad-spectrum antibiotics. MRI of the left foot was concerning for osteomyelitis.      Assessment/Plan:   Active Problems:   Sepsis (HCC)   Diabetic foot infection with possible osteomyelitis, left  (HCC)   Atrial fibrillation (HCC)   Benign essential hypertension   CKD (chronic kidney disease) stage 3, GFR 30-59 ml/min (HCC)   Coronary artery disease   Mixed hyperlipidemia   Type 2 diabetes mellitus with stage 3b chronic kidney disease, with long-term current use of insulin (HCC)   Open wound of left foot with complication   Gram-negative bacteremia    Body mass index is 27.83 kg/m.    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   He will finish course of IV cefepime today. Left BKA stump wound dressing was changed by vascular surgeon today.   Transient hypotension Resolved    Hypokalemia Improved    Atrial fibrillation (HCC) Rate controlled. Continue Eliquis and metoprolol     Type 2 diabetes mellitus with hyperglycemia Glucose levels were okay.  Continue Semglee. NovoLog as needed for hyperglycemia.     Comorbidities include CAD s/p CABG, hypertension, mixed hyperlipidemia, CKD stage IIIa, anemia of chronic disease    He is medically stable for discharge.  Awaiting placement to SNF.       Diet Order             Diet Carb Modified Fluid consistency: Thin; Room service appropriate? Yes  Diet effective now                            Consultants: ID specialist Vascular surgeon  Procedures: Aortogram and selective lower extremity angiogram, percutaneous transluminal angioplasty of left anterior tibial artery on 05/14/2023    Medications:    apixaban  5 mg Oral BID   insulin aspart  0-15 Units Subcutaneous TID WC   insulin aspart  0-5 Units Subcutaneous QHS   insulin glargine-yfgn  22 Units Subcutaneous Daily   metoprolol tartrate  25 mg Oral BID   Continuous Infusions:  ceFEPime (MAXIPIME) IV 2 g (05/20/23  1914)     Anti-infectives (From admission, onward)    Start     Dose/Rate Route Frequency Ordered Stop   05/16/23 1030  ceFAZolin (ANCEF) IVPB 2g/100 mL premix        2 g 200 mL/hr over 30 Minutes Intravenous 30 min pre-op 05/15/23 2150 05/16/23 1115   05/12/23 1400  ceFEPIme (MAXIPIME) 2 g in sodium chloride 0.9 % 100 mL IVPB        2 g 200 mL/hr over 30 Minutes Intravenous Every 8 hours 05/12/23 1318 05/20/23 2359   05/11/23 1300  vancomycin (VANCOREADY) IVPB 1750 mg/350  mL  Status:  Discontinued        1,750 mg 175 mL/hr over 120 Minutes Intravenous Every 24 hours 05/10/23 1208 05/11/23 1537   05/11/23 1231  ceFAZolin (ANCEF) IVPB 2g/100 mL premix  Status:  Discontinued        2 g 200 mL/hr over 30 Minutes Intravenous 30 min pre-op 05/11/23 1231 05/11/23 1423   05/10/23 2300  ceFEPIme (MAXIPIME) 2 g in sodium chloride 0.9 % 100 mL IVPB  Status:  Discontinued        2 g 200 mL/hr over 30 Minutes Intravenous Every 12 hours 05/10/23 1208 05/12/23 1318   05/10/23 2000  metroNIDAZOLE (FLAGYL) IVPB 500 mg        500 mg 100 mL/hr over 60 Minutes Intravenous Every 12 hours 05/10/23 1145 05/17/23 1009   05/10/23 1100  ceFEPIme (MAXIPIME) 2 g in sodium chloride 0.9 % 100 mL IVPB        2 g 200 mL/hr over 30 Minutes Intravenous  Once 05/10/23 1050 05/10/23 1141   05/10/23 1100  vancomycin (VANCOREADY) IVPB 2000 mg/400 mL        2,000 mg 200 mL/hr over 120 Minutes Intravenous  Once 05/10/23 1050 05/10/23 1528              Family Communication/Anticipated D/C date and plan/Code Status   DVT prophylaxis:  apixaban (ELIQUIS) tablet 5 mg     Code Status: Full Code  Family Communication: None Disposition Plan: Plan to discharge to SNF   Status is: Inpatient Remains inpatient appropriate because: Awaiting placement to SNF       Subjective:   No acute events overnight.  He feels well and has no complaints.  Objective:    Vitals:   05/19/23 1638 05/19/23 2102 05/20/23 0520 05/20/23 0826  BP: 110/65 118/72 109/69 114/60  Pulse: 84 81 84 (!) 105  Resp: 16 18 18 16   Temp: 98.7 F (37.1 C) 98.8 F (37.1 C) 98.4 F (36.9 C) 98.2 F (36.8 C)  TempSrc: Oral Oral  Oral  SpO2: 100% 98% 99% 99%  Weight:      Height:       No data found.   Intake/Output Summary (Last 24 hours) at 05/20/2023 1221 Last data filed at 05/20/2023 1100 Gross per 24 hour  Intake 1901.3 ml  Output 700 ml  Net 1201.3 ml   Filed Weights   05/10/23 0931 05/16/23  1103  Weight: 101 kg 101 kg    Exam:   GEN: NAD SKIN: Warm and dry EYES: No pallor, anicteric ENT: MMM CV: RRR PULM: CTA B ABD: soft, ND, NT, +BS CNS: AAO x 3, non focal EXT: Left BKA stump wound with clean and dry dressing    Data Reviewed:   I have personally reviewed following labs and imaging studies:  Labs: Labs show the following:   Basic Metabolic Panel: Recent  Labs  Lab 05/17/23 0535 05/18/23 0720  NA 136 137  K 3.4* 3.7  CL 109 108  CO2 19* 21*  GLUCOSE 172* 172*  BUN 9 10  CREATININE 1.13 1.05  CALCIUM 8.0* 8.1*  MG 1.8  --    GFR Estimated Creatinine Clearance: 74.9 mL/min (by C-G formula based on SCr of 1.05 mg/dL). Liver Function Tests: No results for input(s): "AST", "ALT", "ALKPHOS", "BILITOT", "PROT", "ALBUMIN" in the last 168 hours.  No results for input(s): "LIPASE", "AMYLASE" in the last 168 hours. No results for input(s): "AMMONIA" in the last 168 hours. Coagulation profile No results for input(s): "INR", "PROTIME" in the last 168 hours.   CBC: Recent Labs  Lab 05/17/23 0535 05/18/23 0720  WBC 8.5 7.8  NEUTROABS 7.1  --   HGB 9.7* 9.4*  HCT 29.3* 28.1*  MCV 86.7 84.4  PLT 252 242   Cardiac Enzymes: No results for input(s): "CKTOTAL", "CKMB", "CKMBINDEX", "TROPONINI" in the last 168 hours. BNP (last 3 results) No results for input(s): "PROBNP" in the last 8760 hours. CBG: Recent Labs  Lab 05/19/23 0820 05/19/23 1130 05/19/23 1637 05/19/23 2059 05/20/23 0827  GLUCAP 150* 166* 154* 152* 166*   D-Dimer: No results for input(s): "DDIMER" in the last 72 hours. Hgb A1c: No results for input(s): "HGBA1C" in the last 72 hours. Lipid Profile: No results for input(s): "CHOL", "HDL", "LDLCALC", "TRIG", "CHOLHDL", "LDLDIRECT" in the last 72 hours. Thyroid function studies: No results for input(s): "TSH", "T4TOTAL", "T3FREE", "THYROIDAB" in the last 72 hours.  Invalid input(s): "FREET3" Anemia work up: No results for  input(s): "VITAMINB12", "FOLATE", "FERRITIN", "TIBC", "IRON", "RETICCTPCT" in the last 72 hours. Sepsis Labs: Recent Labs  Lab 05/17/23 0535 05/18/23 0720  WBC 8.5 7.8    Microbiology Recent Results (from the past 240 hours)  MRSA Next Gen by PCR, Nasal     Status: None   Collection Time: 05/11/23  5:01 AM   Specimen: Nasal Mucosa; Nasal Swab  Result Value Ref Range Status   MRSA by PCR Next Gen NOT DETECTED NOT DETECTED Final    Comment: (NOTE) The GeneXpert MRSA Assay (FDA approved for NASAL specimens only), is one component of a comprehensive MRSA colonization surveillance program. It is not intended to diagnose MRSA infection nor to guide or monitor treatment for MRSA infections. Test performance is not FDA approved in patients less than 93 years old. Performed at [facility], [address] [place], [place] [address]   Culture, blood (Routine X 2) w Reflex to ID Panel     Status: None   Collection Time: 05/11/23 10:16 AM   Specimen: BLOOD  Result Value Ref Range Status   Specimen Description BLOOD BLOOD LEFT HAND  Final   Special Requests   Final    BOTTLES DRAWN AEROBIC AND ANAEROBIC Blood Culture results may not be optimal due to an inadequate volume of blood received in culture bottles   Culture   Final    NO GROWTH 5 DAYS Performed at [facility], [address] [place], [place] [address]    Report Status 05/16/2023 FINAL  Final  Culture, blood (Routine X 2) w Reflex to ID Panel     Status: None   Collection Time: 05/11/23 10:16 AM   Specimen: BLOOD  Result Value Ref Range Status   Specimen Description BLOOD BLOOD RIGHT HAND  Final   Special Requests   Final    BOTTLES DRAWN AEROBIC AND ANAEROBIC Blood Culture results may not  be optimal due to an inadequate volume of blood received in culture bottles   Culture   Final    NO GROWTH 5 DAYS Performed at [facility], [address] [place], [place] [address]    Report Status  05/16/2023 FINAL  Final    Procedures and diagnostic studies:  No results found.              LOS: 10 days   Sherlon Nied  [facility]   Pager on [email or web address]. If 7PM-7AM, please contact night-coverage at [email or web address]     05/20/2023, 12:21 PM

## 2023-05-20 NOTE — TOC Progression Note (Addendum)
Transition of Care [facility]) - Progression Note    Patient Details  Name: Andrew Harding MRN: [id number] Date of Birth: 1950/07/08  Transition of [facility]) CM/SW Contact  Liliana Cline, LCSW Phone Number: 05/20/2023, 10:12 AM  Clinical Narrative:    CSW reached out to Tammy at [facility] to inquire status of insurance authorization and if patient can come to [facility] tomorrow - she states she will check.   Expected Discharge Plan: Skilled Nursing Facility Barriers to Discharge: Continued Medical Work up  Expected Discharge Plan and Services     Post Acute Care Choice: Skilled Nursing Facility Living arrangements for the past 2 months: Single Family Home                                       Social Determinants of Health (SDOH) Interventions SDOH Screenings   Food Insecurity: No Food Insecurity (05/12/2023)  Housing: Low Risk  (05/12/2023)  Transportation Needs: No Transportation Needs (05/12/2023)  Utilities: Not At Risk (05/12/2023)  Depression (PHQ2-9): Low Risk  (05/03/2023)  Financial Resource Strain: Low Risk  (12/13/2022)   Received from [facility]  Social Connections: Moderately Isolated (05/12/2023)  Tobacco Use: Medium Risk (05/16/2023)    Readmission Risk Interventions    02/28/2023   12:53 PM  Readmission Risk Prevention Plan  Transportation Screening Complete  PCP or Specialist Appt within 3-5 Days Complete  HRI or Home Care Consult Complete  Social Work Consult for Recovery Care Planning/Counseling Complete  Palliative Care Screening Not Applicable  Medication Review Oceanographer) Complete

## 2023-05-20 NOTE — Progress Notes (Signed)
4 Days Post-Op   Subjective/Chief Complaint: Doing well. Worked with PT yesterday. Denies pain.   Objective: Vital signs in last 24 hours: Temp:  [98.2 F (36.8 C)-98.8 F (37.1 C)] 98.2 F (36.8 C) (01/19 0826) Pulse Rate:  [81-105] 105 (01/19 0826) Resp:  [16-18] 16 (01/19 0826) BP: (109-118)/(60-72) 114/60 (01/19 0826) SpO2:  [98 %-100 %] 99 % (01/19 0826) Last BM Date : 05/19/23  Intake/Output from previous day: 01/18 0701 - 01/19 0700 In: 1901.3 [IV Piggyback:1901.3] Out: 750 [Urine:750] Intake/Output this shift: Total I/O In: -  Out: 300 [Urine:300]   General appearance: cooperative and no distress Cardio: regular rate and rhythm Extremities: LEFT BKA stump- dressing changed- stump incision C/D/I, soft, viable  Lab Results:  Recent Labs    05/18/23 0720  WBC 7.8  HGB 9.4*  HCT 28.1*  PLT 242   BMET Recent Labs    05/18/23 0720  NA 137  K 3.7  CL 108  CO2 21*  GLUCOSE 172*  BUN 10  CREATININE 1.05  CALCIUM 8.1*   PT/INR No results for input(s): "LABPROT", "INR" in the last 72 hours. ABG No results for input(s): "PHART", "HCO3" in the last 72 hours.  Invalid input(s): "PCO2", "PO2"  Studies/Results: No results found.  Anti-infectives: Anti-infectives (From admission, onward)    Start     Dose/Rate Route Frequency Ordered Stop   05/16/23 1030  ceFAZolin (ANCEF) IVPB 2g/100 mL premix        2 g 200 mL/hr over 30 Minutes Intravenous 30 min pre-op 05/15/23 2150 05/16/23 1115   05/12/23 1400  ceFEPIme (MAXIPIME) 2 g in sodium chloride 0.9 % 100 mL IVPB        2 g 200 mL/hr over 30 Minutes Intravenous Every 8 hours 05/12/23 1318 05/20/23 2359   05/11/23 1300  vancomycin (VANCOREADY) IVPB 1750 mg/350 mL  Status:  Discontinued        1,750 mg 175 mL/hr over 120 Minutes Intravenous Every 24 hours 05/10/23 1208 05/11/23 1537   05/11/23 1231  ceFAZolin (ANCEF) IVPB 2g/100 mL premix  Status:  Discontinued        2 g 200 mL/hr over 30 Minutes  Intravenous 30 min pre-op 05/11/23 1231 05/11/23 1423   05/10/23 2300  ceFEPIme (MAXIPIME) 2 g in sodium chloride 0.9 % 100 mL IVPB  Status:  Discontinued        2 g 200 mL/hr over 30 Minutes Intravenous Every 12 hours 05/10/23 1208 05/12/23 1318   05/10/23 2000  metroNIDAZOLE (FLAGYL) IVPB 500 mg        500 mg 100 mL/hr over 60 Minutes Intravenous Every 12 hours 05/10/23 1145 05/17/23 1009   05/10/23 1100  ceFEPIme (MAXIPIME) 2 g in sodium chloride 0.9 % 100 mL IVPB        2 g 200 mL/hr over 30 Minutes Intravenous  Once 05/10/23 1050 05/10/23 1141   05/10/23 1100  vancomycin (VANCOREADY) IVPB 2000 mg/400 mL        2,000 mg 200 mL/hr over 120 Minutes Intravenous  Once 05/10/23 1050 05/10/23 1528       Assessment/Plan: s/p Procedure(s): AMPUTATION BELOW KNEE (Left) POD #4  OK for disposition from Vascular standpoint OOB with PT Extensive discussion with patient/wife regarding next steps, recovery and Prosthesis  LOS: 10 days    Eli Hose A 05/20/2023

## 2023-05-20 NOTE — Plan of Care (Signed)
  Problem: Clinical Measurements: Goal: Signs and symptoms of infection will decrease Outcome: Progressing   

## 2023-05-21 ENCOUNTER — Encounter: Payer: Self-pay | Admitting: Vascular Surgery

## 2023-05-21 ENCOUNTER — Encounter: Payer: Self-pay | Admitting: Infectious Diseases

## 2023-05-21 DIAGNOSIS — M86172 Other acute osteomyelitis, left ankle and foot: Secondary | ICD-10-CM | POA: Diagnosis not present

## 2023-05-21 DIAGNOSIS — R7881 Bacteremia: Secondary | ICD-10-CM | POA: Diagnosis not present

## 2023-05-21 DIAGNOSIS — S91302A Unspecified open wound, left foot, initial encounter: Secondary | ICD-10-CM | POA: Diagnosis not present

## 2023-05-21 DIAGNOSIS — B9689 Other specified bacterial agents as the cause of diseases classified elsewhere: Secondary | ICD-10-CM | POA: Diagnosis not present

## 2023-05-21 LAB — GLUCOSE, CAPILLARY
Glucose-Capillary: 129 mg/dL — ABNORMAL HIGH (ref 70–99)
Glucose-Capillary: 177 mg/dL — ABNORMAL HIGH (ref 70–99)
Glucose-Capillary: 207 mg/dL — ABNORMAL HIGH (ref 70–99)
Glucose-Capillary: 158 mg/dL — ABNORMAL HIGH (ref 70–99)
Glucose-Capillary: 172 mg/dL — ABNORMAL HIGH (ref 70–99)

## 2023-05-21 LAB — SURGICAL PATHOLOGY

## 2023-05-21 NOTE — Progress Notes (Signed)
Physical Therapy Treatment Patient Details Name: Andrew Harding MRN: [id number] DOB: 04-Jan-1951 Today's Date: 05/21/2023   History of Present Illness Andrew Harding is a 73 y.o. male with medical history significant of insulin-dependent DM 2, A-fib on Eliquis, HTN, CAD status post CABG, CKD 3B, PAD s/p transmetatarsal amputation left foot presenting w/ sepsis, suspected LLE cellulitis vs. Osteomyelitis. 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    PT Comments  Pt received shrinker for L LE residual limb and pt is tolerating well.  Instructed pt on donning/doffing shrinker and wear schedule to increase tolerance; pt verbalized understanding.  Reviewed/performed LLE strengthening exercises for quad and hamstring strengthening and stretching.  Overall pt demonstrates transfers and gait with RW at [facility] to supervision level.  Continued PT will assist pt towards greater safety and independence with functional mobility.   If plan is discharge home, recommend the following: A lot of help with bathing/dressing/bathroom;A little help with walking and/or transfers;Assistance with cooking/housework;Assist for transportation;Help with stairs or ramp for entrance   Can travel by private vehicle     Yes  Equipment Recommendations  None recommended by PT    Recommendations for Other Services       Precautions / Restrictions Precautions Precautions: Fall Other Brace: has an ortho shoe for left foot, but no current order to use it. L BKA 05/16/23 Restrictions Weight Bearing Restrictions Per Provider Order: No LLE Weight Bearing Per Provider Order: Non weight bearing     Mobility  Bed Mobility Overal bed mobility: Modified Independent Bed Mobility: Supine to Sit Rolling: Supervision   Supine to sit: Supervision          Transfers Overall transfer level: Needs assistance Equipment used: Rolling walker (2 wheels) Transfers: Sit to/from Stand Sit to Stand: From elevated surface, Contact guard assist, Supervision                 Ambulation/Gait Ambulation/Gait assistance: Contact guard assist, Supervision Gait Distance (Feet): 30 Feet Assistive device: Rolling walker (2 wheels) Gait Pattern/deviations:  (" hop to.")       General Gait Details: Pt was able to "hop" ~ 30 ft with RW without LOB. distance limited by fatigue but overall pt is progressing well   Stairs             Wheelchair Mobility     Tilt Bed    Modified Rankin (Stroke Patients Only)       Balance Overall balance assessment: Needs assistance Sitting-balance support: No upper extremity supported, Feet supported Sitting balance-Leahy Scale: Normal Sitting balance - Comments: able to don shoes independently while sitting at edge of bed   Standing balance support: Bilateral upper extremity supported, During functional activity, Reliant on assistive device for balance Standing balance-Leahy Scale: Fair Standing balance comment: increased sway but no LOB with standing mobility without AD or hand held support                            Cognition Arousal: Alert Behavior During Therapy: WFL for tasks assessed/performed Overall Cognitive Status: Within Functional Limits for tasks assessed                                          Exercises Total Joint Exercises Ankle Circles/Pumps: AROM, Strengthening, Left, 10 reps Long Arc Quad: AROM, Strengthening, Left, 5 reps Knee Flexion: AROM, Strengthening, Left,  5 reps Other Exercises Other Exercises: Instructed pt on how to don/doff shrinker, wear schedule to increase tolerance (on 1-2 hrs/ off 1-2hrs) and benefits of shrinker.  Reviewed resting position of residual limb for stretching L hamstring.    General Comments        Pertinent Vitals/Pain Pain Assessment Pain Assessment: No/denies pain    Home Living                          Prior Function            PT Goals (current goals can now be found in the care plan  section) Acute Rehab PT Goals Patient Stated Goal: rehab then home PT Goal Formulation: With patient Time For Goal Achievement: 05/31/23 Potential to Achieve Goals: Good Progress towards PT goals: Progressing toward goals    Frequency    7X/week      PT Plan      Co-evaluation              AM-PAC PT "6 Clicks" Mobility   Outcome Measure  Help needed turning from your back to your side while in a flat bed without using bedrails?: A Little Help needed moving from lying on your back to sitting on the side of a flat bed without using bedrails?: A Little Help needed moving to and from a bed to a chair (including a wheelchair)?: A Little Help needed standing up from a chair using your arms (e.g., wheelchair or bedside chair)?: A Little Help needed to walk in hospital room?: A Little Help needed climbing 3-5 steps with a railing? : A Lot 6 Click Score: 17    End of Session Equipment Utilized During Treatment: Gait belt Activity Tolerance: Patient tolerated treatment well;Patient limited by fatigue Patient left: in chair;with call bell/phone within reach Nurse Communication: Mobility status PT Visit Diagnosis: Unsteadiness on feet (R26.81);Difficulty in walking, not elsewhere classified (R26.2);Other abnormalities of gait and mobility (R26.89)     Time: 7371-0626 PT Time Calculation (min) (ACUTE ONLY): 23 min  Charges:    $Gait Training: 8-22 mins $Therapeutic Exercise: 8-22 mins PT General Charges $$ ACUTE PT VISIT: 1 Visit                     Hortencia Conradi, PTA  05/21/23, 11:37 AM

## 2023-05-21 NOTE — Progress Notes (Signed)
   Date of Admission:  05/10/2023   T   ID: Andrew Harding is a 73 y.o. male  Active Problems:   Type 2 diabetes mellitus with stage 3b chronic kidney disease, with long-term current use of insulin (HCC)   Benign essential hypertension   CKD (chronic kidney disease) stage 3, GFR 30-59 ml/min (HCC)   Coronary artery disease   Mixed hyperlipidemia   Atrial fibrillation (HCC)   Diabetic foot infection with possible osteomyelitis, left  (HCC)   Sepsis (HCC)   Open wound of left foot with complication   Gram-negative bacteremia    Subjective: Having sharp pain intermittently at the BKA site Dressing was changed today he says  Medications:   apixaban  5 mg Oral BID   insulin aspart  0-15 Units Subcutaneous TID WC   insulin aspart  0-5 Units Subcutaneous QHS   insulin glargine-yfgn  22 Units Subcutaneous Daily   metoprolol tartrate  25 mg Oral BID    Objective: Vital signs in last 24 hours: Patient Vitals for the past 24 hrs:  BP Temp Temp src Pulse Resp SpO2  05/21/23 0800 105/69 98.3 F (36.8 C) Oral 73 16 99 %  05/20/23 1956 103/66 98.7 F (37.1 C) Oral 83 18 99 %  05/20/23 1501 113/70 98.2 F (36.8 C) Oral 77 16 98 %      PHYSICAL EXAM:  General: Alert, cooperative, no distress, appears stated age.  Lungs: Clear to auscultation bilaterally. No Wheezing or Rhonchi. No rales. Heart: irregular Abdomen: Soft, non-tender,not distended. Bowel sounds normal. No masses Extremities: left BKA Skin: No rashes or lesions. Or bruising Lymph: Cervical, supraclavicular normal. Neurologic: Grossly non-focal  Lab Results    Latest Ref Rng & Units 05/18/2023    7:20 AM 05/17/2023    5:35 AM 05/12/2023    4:49 AM  CBC  WBC 4.0 - 10.5 K/uL 7.8  8.5  4.6   Hemoglobin 13.0 - 17.0 g/dL 9.4  9.7  16.1   Hematocrit 39.0 - 52.0 % 28.1  29.3  31.1   Platelets 150 - 400 K/uL 242  252  228        Latest Ref Rng & Units 05/18/2023    7:20 AM 05/17/2023    5:35 AM 05/12/2023    4:49 AM  CMP   Glucose 70 - 99 mg/dL 096  045  409   BUN 8 - 23 mg/dL 10  9  15    Creatinine 0.61 - 1.24 mg/dL 8.11  9.14  7.82   Sodium 135 - 145 mmol/L 137  136  135   Potassium 3.5 - 5.1 mmol/L 3.7  3.4  3.6   Chloride 98 - 111 mmol/L 108  109  105   CO2 22 - 32 mmol/L 21  19  19    Calcium 8.9 - 10.3 mg/dL 8.1  8.0  8.5       Microbiology: 05/10/23 BC morganella 05/11/23 BC NG      Assessment/Plan: Morganella bacteremia on cefepime- completed 10 days of IV on 05/20/23 (   Diabetic foot infection Left foot with ulcer on the lateral margin TMA of the left foot BKA this admission  CKD-   CAD s/p CABG   Afib on eliquis and metoprolol Discussed the management with the patient  ID will sign off Call if needed

## 2023-05-21 NOTE — Plan of Care (Signed)

## 2023-05-21 NOTE — Progress Notes (Addendum)
Progress Note    Andrew Harding  ZOX:[id number] DOB: March 09, 1951  DOA: 05/10/2023 PCP: Lynnea Ferrier, MD      Brief Narrative:    Medical records reviewed and are as summarized below:  Andrew Harding is a 73 y.o. male with medical history significant of insulin-dependent DM 2, A-fib on Eliquis, HTN, CAD status post CABG, CKD 3B, PAD s/p transmetatarsal amputation left foot presenting w/ sepsis, suspected LLE cellulitis vs. Osteomyelitis. Pt reports worsening pain over L foot for multiple weeks.  Noted to have been admitted 03/2023 for issues including MSSA bacteremia, diabetic foot infection.Has had outpt follow up w/ ID and podiatry since discharge.    Noted recent evaluation w/ ID  cultures showing enterobacter, klebsiella and MSSA in the recent culture taken from the lateral wound- transitioned from cefadroxil to cipro. No reported medical noncompliance. Had follow up with podiatry 05/03/2023 w/ plan for MRI and vascular referral.    On presentation patient was febrile at 100.5, heart rate 100s, WBC 10.8, sodium 127, creatinine 1.42, glucose 242, lactic acid 2.3>>1.1.  DG left foot with soft tissue swelling.  Chest x-ray normal.  Procalcitonin 0.48. Vascular surgery and podiatry was consulted. Started on broad-spectrum antibiotics. MRI of the left foot was concerning for osteomyelitis.      Assessment/Plan:   Active Problems:   Sepsis (HCC)   Diabetic foot infection with possible osteomyelitis, left  (HCC)   Atrial fibrillation (HCC)   Benign essential hypertension   CKD (chronic kidney disease) stage 3, GFR 30-59 ml/min (HCC)   Coronary artery disease   Mixed hyperlipidemia   Type 2 diabetes mellitus with stage 3b chronic kidney disease, with long-term current use of insulin (HCC)   Open wound of left foot with complication   Gram-negative bacteremia    Body mass index is 27.83 kg/m.    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   Completed course of IV cefepime on 05/20/2023. Left BKA stump wound dressing was changed by vascular surgeon on 05/20/2023.   Transient hypotension Resolved    Hypokalemia Improved    Atrial fibrillation (HCC) Rate controlled. Continue Eliquis and metoprolol     Type 2 diabetes mellitus with hyperglycemia Overall, glucose levels are optimal.  Continue Semglee NovoLog as needed for hyperglycemia.     Comorbidities include CAD s/p CABG, hypertension, mixed hyperlipidemia, CKD stage IIIa, anemia of chronic disease    He is medically stable for discharge.  Awaiting placement to SNF.       Diet Order             Diet Carb Modified Fluid consistency: Thin; Room service appropriate? Yes  Diet effective now                            Consultants: ID specialist Vascular surgeon  Procedures: Aortogram and selective lower extremity angiogram, percutaneous transluminal angioplasty of left anterior tibial artery on 05/14/2023    Medications:    apixaban  5 mg Oral BID   insulin aspart  0-15 Units Subcutaneous TID WC   insulin aspart  0-5 Units Subcutaneous QHS   insulin glargine-yfgn  22 Units Subcutaneous Daily   metoprolol tartrate  25 mg Oral BID   Continuous Infusions:     Anti-infectives (From  admission, onward)    Start     Dose/Rate Route Frequency Ordered Stop   05/16/23 1030  ceFAZolin (ANCEF) IVPB 2g/100 mL premix        2 g 200 mL/hr over 30 Minutes Intravenous 30 min pre-op 05/15/23 2150 05/16/23 1115   05/12/23 1400  ceFEPIme (MAXIPIME) 2 g in sodium chloride 0.9 % 100 mL IVPB        2 g 200 mL/hr over 30 Minutes Intravenous Every 8 hours 05/12/23 1318 05/20/23 2334   05/11/23 1300  vancomycin (VANCOREADY) IVPB 1750 mg/350 mL  Status:  Discontinued         1,750 mg 175 mL/hr over 120 Minutes Intravenous Every 24 hours 05/10/23 1208 05/11/23 1537   05/11/23 1231  ceFAZolin (ANCEF) IVPB 2g/100 mL premix  Status:  Discontinued        2 g 200 mL/hr over 30 Minutes Intravenous 30 min pre-op 05/11/23 1231 05/11/23 1423   05/10/23 2300  ceFEPIme (MAXIPIME) 2 g in sodium chloride 0.9 % 100 mL IVPB  Status:  Discontinued        2 g 200 mL/hr over 30 Minutes Intravenous Every 12 hours 05/10/23 1208 05/12/23 1318   05/10/23 2000  metroNIDAZOLE (FLAGYL) IVPB 500 mg        500 mg 100 mL/hr over 60 Minutes Intravenous Every 12 hours 05/10/23 1145 05/17/23 1009   05/10/23 1100  ceFEPIme (MAXIPIME) 2 g in sodium chloride 0.9 % 100 mL IVPB        2 g 200 mL/hr over 30 Minutes Intravenous  Once 05/10/23 1050 05/10/23 1141   05/10/23 1100  vancomycin (VANCOREADY) IVPB 2000 mg/400 mL        2,000 mg 200 mL/hr over 120 Minutes Intravenous  Once 05/10/23 1050 05/10/23 1528              Family Communication/Anticipated D/C date and plan/Code Status   DVT prophylaxis:  apixaban (ELIQUIS) tablet 5 mg     Code Status: Full Code  Family Communication: None Disposition Plan: Plan to discharge to SNF   Status is: Inpatient Remains inpatient appropriate because: Awaiting placement to SNF       Subjective:   He complains of soreness at left BKA stump site.  No other complaints  Objective:    Vitals:   05/20/23 1501 05/20/23 1956 05/21/23 0800 05/21/23 1535  BP: 113/70 103/66 105/69 113/63  Pulse: 77 83 73 81  Resp: 16 18 16    Temp: 98.2 F (36.8 C) 98.7 F (37.1 C) 98.3 F (36.8 C) 98.2 F (36.8 C)  TempSrc: Oral Oral Oral Oral  SpO2: 98% 99% 99% 100%  Weight:      Height:       No data found.   Intake/Output Summary (Last 24 hours) at 05/21/2023 1712 Last data filed at 05/20/2023 2355 Gross per 24 hour  Intake 300.8 ml  Output --  Net 300.8 ml   Filed Weights   05/10/23 0931 05/16/23 1103  Weight: 101 kg 101 kg     Exam:  GEN: NAD SKIN: Warm and dry EYES: No pallor or icterus ENT: MMM CV: RRR PULM: CTA B ABD: soft, ND, NT, +BS CNS: AAO x 3, non focal EXT: He has a shrinker on left BKA      Data Reviewed:   I have personally reviewed following labs and imaging studies:  Labs: Labs show the following:   Basic Metabolic Panel: Recent Labs  Lab 05/17/23 0535 05/18/23 0720  NA 136 137  K 3.4* 3.7  CL 109 108  CO2 19* 21*  GLUCOSE 172* 172*  BUN 9 10  CREATININE 1.13 1.05  CALCIUM 8.0* 8.1*  MG 1.8  --    GFR Estimated Creatinine Clearance: 74.9 mL/min (by C-G formula based on SCr of 1.05 mg/dL). Liver Function Tests: No results for input(s): "AST", "ALT", "ALKPHOS", "BILITOT", "PROT", "ALBUMIN" in the last 168 hours.  No results for input(s): "LIPASE", "AMYLASE" in the last 168 hours. No results for input(s): "AMMONIA" in the last 168 hours. Coagulation profile No results for input(s): "INR", "PROTIME" in the last 168 hours.   CBC: Recent Labs  Lab 05/17/23 0535 05/18/23 0720  WBC 8.5 7.8  NEUTROABS 7.1  --   HGB 9.7* 9.4*  HCT 29.3* 28.1*  MCV 86.7 84.4  PLT 252 242   Cardiac Enzymes: No results for input(s): "CKTOTAL", "CKMB", "CKMBINDEX", "TROPONINI" in the last 168 hours. BNP (last 3 results) No results for input(s): "PROBNP" in the last 8760 hours. CBG: Recent Labs  Lab 05/20/23 1625 05/20/23 2306 05/21/23 0802 05/21/23 1122 05/21/23 1645  GLUCAP 131* 154* 129* 177* 207*   D-Dimer: No results for input(s): "DDIMER" in the last 72 hours. Hgb A1c: No results for input(s): "HGBA1C" in the last 72 hours. Lipid Profile: No results for input(s): "CHOL", "HDL", "LDLCALC", "TRIG", "CHOLHDL", "LDLDIRECT" in the last 72 hours. Thyroid function studies: No results for input(s): "TSH", "T4TOTAL", "T3FREE", "THYROIDAB" in the last 72 hours.  Invalid input(s): "FREET3" Anemia work up: No results for input(s): "VITAMINB12", "FOLATE", "FERRITIN",  "TIBC", "IRON", "RETICCTPCT" in the last 72 hours. Sepsis Labs: Recent Labs  Lab 05/17/23 0535 05/18/23 0720  WBC 8.5 7.8    Microbiology No results found for this or any previous visit (from the past 240 hours).   Procedures and diagnostic studies:  No results found.              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 If 7PM-7AM, please contact night-coverage at [email or web address]     05/21/2023, 5:12 PM

## 2023-05-22 ENCOUNTER — Telehealth (INDEPENDENT_AMBULATORY_CARE_PROVIDER_SITE_OTHER): Payer: Self-pay | Admitting: Vascular Surgery

## 2023-05-22 DIAGNOSIS — M86172 Other acute osteomyelitis, left ankle and foot: Secondary | ICD-10-CM | POA: Diagnosis not present

## 2023-05-22 LAB — GLUCOSE, CAPILLARY
Glucose-Capillary: 139 mg/dL — ABNORMAL HIGH (ref 70–99)
Glucose-Capillary: 198 mg/dL — ABNORMAL HIGH (ref 70–99)
Glucose-Capillary: 176 mg/dL — ABNORMAL HIGH (ref 70–99)
Glucose-Capillary: 173 mg/dL — ABNORMAL HIGH (ref 70–99)

## 2023-05-22 NOTE — Progress Notes (Signed)
Occupational Therapy Treatment Patient Details Name: Andrew Harding MRN: [id number] DOB: 02/28/1951 Today's Date: 05/22/2023   History of present illness Andrew Harding is a 73 y.o. male with medical history significant of insulin-dependent DM 2, A-fib on Eliquis, HTN, CAD status post CABG, CKD 3B, PAD s/p transmetatarsal amputation left foot presenting w/ sepsis, suspected LLE cellulitis vs. Osteomyelitis. 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   OT comments  Pt performing ADL session with seated grooming tasks sitting EOB for shaving, oral care and applying face lotion. Functional mobility t/f bathroom with RW, "hopping" with heavy UE use on walker for support, CGA for balance. Toileting tasks with minA for pulling underwear over residual limb. Pt pleasant, motivated and grateful for assist for meaningful task participation. Making good progress towards goals. Discharge recommendation appropriate. OT will continue to follow for functional gains. Patient will benefit from continued inpatient follow up therapy, <3 hours/day       If plan is discharge home, recommend the following:  A little help with walking and/or transfers;A little help with bathing/dressing/bathroom;Help with stairs or ramp for entrance   Equipment Recommendations  BSC/3in1       Precautions / Restrictions Precautions Precautions: Fall Required Braces or Orthoses: Other Brace Other Brace: LLE shrinker Restrictions Weight Bearing Restrictions Per Provider Order: Yes LLE Weight Bearing Per Provider Order: Non weight bearing       Mobility Bed Mobility Overal bed mobility: Modified Independent Bed Mobility: Rolling, Sidelying to Sit, Supine to Sit Rolling: Modified independent (Device/Increase time) Sidelying to sit: Modified independent (Device/Increase time) Supine to sit: Modified independent (Device/Increase time)          Transfers Overall transfer level: Needs assistance Equipment used: Rolling walker (2 wheels) Transfers:  Sit to/from Stand, Bed to chair/wheelchair/BSC Sit to Stand: Contact guard assist, From elevated surface     Step pivot transfers: Contact guard assist     General transfer comment: elevated bed due to pt's height     Balance Overall balance assessment: Needs assistance Sitting-balance support: Feet supported Sitting balance-Leahy Scale: Normal     Standing balance support: Bilateral upper extremity supported, During functional activity, Reliant on assistive device for balance Standing balance-Leahy Scale: Fair                             ADL either performed or assessed with clinical judgement   ADL Overall ADL's : Needs assistance/impaired     Grooming: Wash/dry face;Wash/dry hands;Oral care;Sitting Grooming Details (indicate cue type and reason): sits EOB to perform oral care, shaving, applying lotion to face. setup only, supervision for sitting balance. pt verbalizing needs without difficulties                 Toilet Transfer: Contact guard assist;Rolling walker (2 wheels);BSC/3in1;Ambulation Toilet Transfer Details (indicate cue type and reason): cues for hand placement, BSC over commode to raise height and offer use of grab bars to offset UE fatigue due to "hopping" with heavy UE use on RW Toileting- Clothing Manipulation and Hygiene: Sit to/from stand;Minimal assistance Toileting - Clothing Manipulation Details (indicate cue type and reason): minA to manage gown and thread limb through underwear in standing     Functional mobility during ADLs: Contact guard assist;Rolling walker (2 wheels) General ADL Comments: Pt performing ADL session with seated grooming tasks sitting EOB for shaving, oral care and applying face lotion. Functional mobility t/f bathroom with RW, "hopping" with heavy UE use on walker for  support, CGA for balance. Toileting tasks with minA for pulling underwear over residual limb. Pt pleasant, motivated and grateful for assist for  meaningful task participation.      Cognition Arousal: Alert Behavior During Therapy: WFL for tasks assessed/performed Overall Cognitive Status: Within Functional Limits for tasks assessed                                          Exercises Other Exercises Other Exercises: Educated pt on not placing pillow under L knee to avoid contracture       General Comments LLE shrinker in place    Pertinent Vitals/ Pain       Pain Assessment Pain Assessment: 0-10 Pain Score: 4  Pain Location: residual limb Pain Descriptors / Indicators: Sore Pain Intervention(s): Limited activity within patient's tolerance, Monitored during session, Repositioned   Frequency  Min 1X/week        Progress Toward Goals  OT Goals(current goals can now be found in the care plan section)  Progress towards OT goals: Progressing toward goals     Plan         AM-PAC OT "6 Clicks" Daily Activity     Outcome Measure   Help from another person eating meals?: None Help from another person taking care of personal grooming?: A Little Help from another person toileting, which includes using toliet, bedpan, or urinal?: A Little Help from another person bathing (including washing, rinsing, drying)?: A Little Help from another person to put on and taking off regular upper body clothing?: None Help from another person to put on and taking off regular lower body clothing?: A Little 6 Click Score: 20    End of Session Equipment Utilized During Treatment: Rolling walker (2 wheels);Gait belt  OT Visit Diagnosis: Other abnormalities of gait and mobility (R26.89);Muscle weakness (generalized) (M62.81)   Activity Tolerance Patient tolerated treatment well   Patient Left in bed;with call bell/phone within reach;with bed alarm set   Nurse Communication Mobility status        Time: 1610-9604 OT Time Calculation (min): 40 min  Charges: OT General Charges $OT Visit: 1 Visit OT  Treatments $Self Care/Home Management : 38-52 mins  Rickiya Picariello L. Dawanda Mapel, OTR/L  05/22/23, 4:44 PM

## 2023-05-22 NOTE — Progress Notes (Signed)
Physical Therapy Treatment Patient Details Name: Andrew Harding MRN: [id number] DOB: 1950/06/06 Today's Date: 05/22/2023   History of Present Illness Andrew Harding is a 73 y.o. male with medical history significant of insulin-dependent DM 2, A-fib on Eliquis, HTN, CAD status post CABG, CKD 3B, PAD s/p transmetatarsal amputation left foot presenting w/ sepsis, suspected LLE cellulitis vs. Osteomyelitis. 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    PT Comments  Pt is progressing well performing bed mobility at Mod I and transfers and gait (50') with RW at [facility] to supervision level.  Continued PT will assist pt towards greater LLE strength, functional standing balance, and overall activity tolerance to increase pt's functional independence and safety with mobility.   If plan is discharge home, recommend the following: A lot of help with bathing/dressing/bathroom;A little help with walking and/or transfers;Assistance with cooking/housework;Assist for transportation;Help with stairs or ramp for entrance   Can travel by private vehicle     Yes  Equipment Recommendations  None recommended by PT    Recommendations for Other Services       Precautions / Restrictions Precautions Precautions: Fall Required Braces or Orthoses: Other Brace Other Brace: LLE shrinker Restrictions Weight Bearing Restrictions Per Provider Order: No LLE Weight Bearing Per Provider Order: Non weight bearing     Mobility  Bed Mobility Overal bed mobility: Modified Independent Bed Mobility: Rolling, Sidelying to Sit, Supine to Sit Rolling: Modified independent (Device/Increase time) Sidelying to sit: Modified independent (Device/Increase time) Supine to sit: Modified independent (Device/Increase time)     General bed mobility comments: able to roll onto side and prone for HEP ed (tactile cues to edstablish what counts as prone and sidelying)    Transfers Overall transfer level: Needs assistance Equipment used: Rolling walker (2  wheels) Transfers: Sit to/from Stand, Bed to chair/wheelchair/BSC Sit to Stand: Supervision   Step pivot transfers: Supervision       General transfer comment: overal stable, min cues for slowing down at the end of session; pt tired and turning to sit down.    Ambulation/Gait Ambulation/Gait assistance: Supervision Gait Distance (Feet): 50 Feet Assistive device: Rolling walker (2 wheels)         General Gait Details: Pt was able to "hop" ~ 50 ft with RW without LOB. distance limited by fatigue but overall pt is progressing well   Stairs             Wheelchair Mobility     Tilt Bed    Modified Rankin (Stroke Patients Only)       Balance Overall balance assessment: Modified Independent Sitting-balance support: No upper extremity supported, Feet supported Sitting balance-Leahy Scale: Normal Sitting balance - Comments: able to don shoes independently while sitting at edge of bed   Standing balance support: Bilateral upper extremity supported, During functional activity, Reliant on assistive device for balance Standing balance-Leahy Scale: Fair Standing balance comment: increased sway but no LOB with standing mobility without AD or hand held support                            Cognition Arousal: Alert Behavior During Therapy: WFL for tasks assessed/performed Overall Cognitive Status: Within Functional Limits for tasks assessed                                          Exercises Amputee Exercises Hip  Extension: AROM, Prone, Sidelying, 10 reps Hip Flexion/Marching: Sidelying, Left, 10 reps    General Comments        Pertinent Vitals/Pain Pain Assessment Pain Assessment: Faces Faces Pain Scale: Hurts a little bit Pain Location: residual limb Pain Descriptors / Indicators: Sore Pain Intervention(s): Monitored during session    Home Living                          Prior Function            PT Goals (current  goals can now be found in the care plan section) Acute Rehab PT Goals Patient Stated Goal: rehab then home PT Goal Formulation: With patient Time For Goal Achievement: 05/31/23 Potential to Achieve Goals: Good Progress towards PT goals: Progressing toward goals    Frequency    7X/week      PT Plan      Co-evaluation              AM-PAC PT "6 Clicks" Mobility   Outcome Measure  Help needed turning from your back to your side while in a flat bed without using bedrails?: None Help needed moving from lying on your back to sitting on the side of a flat bed without using bedrails?: None Help needed moving to and from a bed to a chair (including a wheelchair)?: A Little Help needed standing up from a chair using your arms (e.g., wheelchair or bedside chair)?: A Little Help needed to walk in hospital room?: A Little Help needed climbing 3-5 steps with a railing? : A Lot 6 Click Score: 19    End of Session Equipment Utilized During Treatment: Gait belt Activity Tolerance: Patient tolerated treatment well;Patient limited by fatigue Patient left: in chair;with call bell/phone within reach Nurse Communication: Mobility status PT Visit Diagnosis: Unsteadiness on feet (R26.81);Difficulty in walking, not elsewhere classified (R26.2);Other abnormalities of gait and mobility (R26.89)     Time: 1045-1106 PT Time Calculation (min) (ACUTE ONLY): 21 min  Charges:    $Therapeutic Exercise: 8-22 mins PT General Charges $$ ACUTE PT VISIT: 1 Visit                     Hortencia Conradi, PTA  05/22/23, 11:21 AM

## 2023-05-22 NOTE — Progress Notes (Signed)
  Progress Note    05/22/2023 10:28 AM 6 Days Post-Op  Subjective:  Andrew Harding POD #6 from Left BKA. Recovering as expected. Continues to work with PT. Denies any pain. Patient had questions related to prosthesis. Answered all his questions.    Vitals:   05/22/23 0425 05/22/23 0727  BP: 105/67 109/67  Pulse: 66 61  Resp: 16 18  Temp: 98 F (36.7 C) 98.3 F (36.8 C)  SpO2: 100%    Physical Exam: Cardiac:  RRR, no rubs clicks or gallops or murmurs appreciated.   Lungs: Clear throughout on auscultation, no rales rhonchi or wheezing Incisions: Right groin puncture site with dressing clean dry and intact from angiogram. Left lower BKA with dressing clean dry and intact.  Extremities: Left lower BKA. Dressing clean dry and intact. No drainage noted.  Abdomen: Soft, nontender nondistended positive bowel sounds Neurologic: A&OX3. Answers all questions and follows commands appropriately.   CBC    Component Value Date/Time   WBC 7.8 05/18/2023 0720   RBC 3.33 (L) 05/18/2023 0720   HGB 9.4 (L) 05/18/2023 0720   HCT 28.1 (L) 05/18/2023 0720   PLT 242 05/18/2023 0720   MCV 84.4 05/18/2023 0720   MCH 28.2 05/18/2023 0720   MCHC 33.5 05/18/2023 0720   RDW 14.1 05/18/2023 0720   LYMPHSABS 0.7 05/17/2023 0535   MONOABS 0.5 05/17/2023 0535   EOSABS 0.1 05/17/2023 0535   BASOSABS 0.0 05/17/2023 0535    BMET    Component Value Date/Time   NA 137 05/18/2023 0720   K 3.7 05/18/2023 0720   CL 108 05/18/2023 0720   CO2 21 (L) 05/18/2023 0720   GLUCOSE 172 (H) 05/18/2023 0720   GLUCOSE 402 (H) 09/17/2012 1417   BUN 10 05/18/2023 0720   CREATININE 1.05 05/18/2023 0720   CALCIUM 8.1 (L) 05/18/2023 0720   GFRNONAA >60 05/18/2023 0720   GFRAA 58 (L) 06/02/2015 0656    INR    Component Value Date/Time   INR 1.3 (H) 05/10/2023 0939     Intake/Output Summary (Last 24 hours) at 05/22/2023 1028 Last data filed at 05/22/2023 0900 Gross per 24 hour  Intake 480 ml  Output --  Net  480 ml     Assessment/Plan:  73 y.o. male is s/p Left BKA. 6 Days Post-Op   PLAN: Pain medication PRN Advance Diet as tolerated.  PT/OT Consults OOB to chair while awake. Incentive spirometry q 1hr while awake.  DVT prophylaxis:  Eliquis 5 mg BID    [facility] 05/22/2023 10:28 AM

## 2023-05-22 NOTE — Progress Notes (Signed)
Progress Note    Andrew Harding  ONG:[id number] DOB: Jan 07, 1951  DOA: 05/10/2023 PCP: Lynnea Ferrier, MD      Brief Narrative:    Medical records reviewed and are as summarized below:  Andrew Harding is a 73 y.o. male with medical history significant of insulin-dependent DM 2, A-fib on Eliquis, HTN, CAD status post CABG, CKD 3B, PAD s/p transmetatarsal amputation left foot presenting w/ sepsis, suspected LLE cellulitis vs. Osteomyelitis. Pt reports worsening pain over L foot for multiple weeks.  Noted to have been admitted 03/2023 for issues including MSSA bacteremia, diabetic foot infection.Has had outpt follow up w/ ID and podiatry since discharge.    Noted recent evaluation w/ ID  cultures showing enterobacter, klebsiella and MSSA in the recent culture taken from the lateral wound- transitioned from cefadroxil to cipro. No reported medical noncompliance. Had follow up with podiatry 05/03/2023 w/ plan for MRI and vascular referral.    On presentation patient was febrile at 100.5, heart rate 100s, WBC 10.8, sodium 127, creatinine 1.42, glucose 242, lactic acid 2.3>>1.1.  DG left foot with soft tissue swelling.  Chest x-ray normal.  Procalcitonin 0.48. Vascular surgery and podiatry was consulted. Started on broad-spectrum antibiotics. MRI of the left foot was concerning for osteomyelitis.      Assessment/Plan:   Active Problems:   Sepsis (HCC)   Diabetic foot infection with possible osteomyelitis, left  (HCC)   Atrial fibrillation (HCC)   Benign essential hypertension   CKD (chronic kidney disease) stage 3, GFR 30-59 ml/min (HCC)   Coronary artery disease   Mixed hyperlipidemia   Type 2 diabetes mellitus with stage 3b chronic kidney disease, with long-term current use of insulin (HCC)   Open wound of left foot with complication   Gram-negative bacteremia    Body mass index is 27.83 kg/m.    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   Completed course of IV cefepime on 05/20/2023. Continue analgesics as needed for pain. He is okay for discharge from vascular surgeon's standpoint.    Transient hypotension Resolved    Hypokalemia Improved    Atrial fibrillation (HCC) Rate controlled. Continue Eliquis and metoprolol     Type 2 diabetes mellitus with hyperglycemia Overall, glucose levels are optimal.  Continue Semglee NovoLog as needed for hyperglycemia.    Comorbidities include CAD s/p CABG, hypertension, mixed hyperlipidemia, CKD stage IIIa, anemia of chronic disease    He is medically stable for discharge.  Awaiting insurance authorization for discharge to SNF.  Plan discussed with his wife at the bedside     Diet Order             Diet Carb Modified Fluid consistency: Thin; Room service appropriate? Yes  Diet effective now                            Consultants: ID specialist Vascular surgeon  Procedures: Aortogram and selective lower extremity angiogram, percutaneous transluminal angioplasty of left anterior tibial artery on 05/14/2023 S/p left BKA on 05/16/2023    Medications:    apixaban  5 mg Oral BID   insulin aspart  0-15 Units Subcutaneous TID WC   insulin aspart  0-5 Units Subcutaneous QHS   insulin glargine-yfgn  22 Units Subcutaneous Daily  metoprolol tartrate  25 mg Oral BID   Continuous Infusions:     Anti-infectives (From admission, onward)    Start     Dose/Rate Route Frequency Ordered Stop   05/16/23 1030  ceFAZolin (ANCEF) IVPB 2g/100 mL premix        2 g 200 mL/hr over 30 Minutes Intravenous 30 min pre-op 05/15/23 2150 05/16/23 1115   05/12/23 1400  ceFEPIme (MAXIPIME) 2 g in sodium chloride 0.9 % 100 mL IVPB        2 g 200 mL/hr over 30 Minutes Intravenous  Every 8 hours 05/12/23 1318 05/20/23 2334   05/11/23 1300  vancomycin (VANCOREADY) IVPB 1750 mg/350 mL  Status:  Discontinued        1,750 mg 175 mL/hr over 120 Minutes Intravenous Every 24 hours 05/10/23 1208 05/11/23 1537   05/11/23 1231  ceFAZolin (ANCEF) IVPB 2g/100 mL premix  Status:  Discontinued        2 g 200 mL/hr over 30 Minutes Intravenous 30 min pre-op 05/11/23 1231 05/11/23 1423   05/10/23 2300  ceFEPIme (MAXIPIME) 2 g in sodium chloride 0.9 % 100 mL IVPB  Status:  Discontinued        2 g 200 mL/hr over 30 Minutes Intravenous Every 12 hours 05/10/23 1208 05/12/23 1318   05/10/23 2000  metroNIDAZOLE (FLAGYL) IVPB 500 mg        500 mg 100 mL/hr over 60 Minutes Intravenous Every 12 hours 05/10/23 1145 05/17/23 1009   05/10/23 1100  ceFEPIme (MAXIPIME) 2 g in sodium chloride 0.9 % 100 mL IVPB        2 g 200 mL/hr over 30 Minutes Intravenous  Once 05/10/23 1050 05/10/23 1141   05/10/23 1100  vancomycin (VANCOREADY) IVPB 2000 mg/400 mL        2,000 mg 200 mL/hr over 120 Minutes Intravenous  Once 05/10/23 1050 05/10/23 1528              Family Communication/Anticipated D/C date and plan/Code Status   DVT prophylaxis:  apixaban (ELIQUIS) tablet 5 mg     Code Status: Full Code  Family Communication: None Disposition Plan: Plan to discharge to SNF   Status is: Inpatient Remains inpatient appropriate because: Awaiting placement to SNF       Subjective:   He complains of soreness at left BKA stump site.  No other complaints  Objective:    Vitals:   05/21/23 1535 05/21/23 2015 05/22/23 0425 05/22/23 0727  BP: 113/63 110/70 105/67 109/67  Pulse: 81 83 66 61  Resp:  20 16 18   Temp: 98.2 F (36.8 C) 98.3 F (36.8 C) 98 F (36.7 C) 98.3 F (36.8 C)  TempSrc: Oral Oral Oral Oral  SpO2: 100% 96% 100%   Weight:      Height:       No data found.   Intake/Output Summary (Last 24 hours) at 05/22/2023 1627 Last data filed at 05/22/2023 1406 Gross per 24  hour  Intake 720 ml  Output --  Net 720 ml   Filed Weights   05/10/23 0931 05/16/23 1103  Weight: 101 kg 101 kg    Exam:  GEN: NAD SKIN: Warm and dry EYES: No pallor or icterus ENT: MMM CV: RRR PULM: CTA B ABD: soft, ND, NT, +BS CNS: AAO x 3, non focal EXT: Shrinker on left BKA stump     Data Reviewed:   I have personally reviewed following labs and imaging studies:  Labs: Labs show the following:  Basic Metabolic Panel: Recent Labs  Lab 05/17/23 0535 05/18/23 0720  NA 136 137  K 3.4* 3.7  CL 109 108  CO2 19* 21*  GLUCOSE 172* 172*  BUN 9 10  CREATININE 1.13 1.05  CALCIUM 8.0* 8.1*  MG 1.8  --    GFR Estimated Creatinine Clearance: 74.9 mL/min (by C-G formula based on SCr of 1.05 mg/dL). Liver Function Tests: No results for input(s): "AST", "ALT", "ALKPHOS", "BILITOT", "PROT", "ALBUMIN" in the last 168 hours.  No results for input(s): "LIPASE", "AMYLASE" in the last 168 hours. No results for input(s): "AMMONIA" in the last 168 hours. Coagulation profile No results for input(s): "INR", "PROTIME" in the last 168 hours.   CBC: Recent Labs  Lab 05/17/23 0535 05/18/23 0720  WBC 8.5 7.8  NEUTROABS 7.1  --   HGB 9.7* 9.4*  HCT 29.3* 28.1*  MCV 86.7 84.4  PLT 252 242   Cardiac Enzymes: No results for input(s): "CKTOTAL", "CKMB", "CKMBINDEX", "TROPONINI" in the last 168 hours. BNP (last 3 results) No results for input(s): "PROBNP" in the last 8760 hours. CBG: Recent Labs  Lab 05/21/23 1645 05/21/23 2131 05/21/23 2153 05/22/23 0755 05/22/23 1145  GLUCAP 207* 172* 158* 139* 198*   D-Dimer: No results for input(s): "DDIMER" in the last 72 hours. Hgb A1c: No results for input(s): "HGBA1C" in the last 72 hours. Lipid Profile: No results for input(s): "CHOL", "HDL", "LDLCALC", "TRIG", "CHOLHDL", "LDLDIRECT" in the last 72 hours. Thyroid function studies: No results for input(s): "TSH", "T4TOTAL", "T3FREE", "THYROIDAB" in the last 72  hours.  Invalid input(s): "FREET3" Anemia work up: No results for input(s): "VITAMINB12", "FOLATE", "FERRITIN", "TIBC", "IRON", "RETICCTPCT" in the last 72 hours. Sepsis Labs: Recent Labs  Lab 05/17/23 0535 05/18/23 0720  WBC 8.5 7.8    Microbiology No results found for this or any previous visit (from the past 240 hours).   Procedures and diagnostic studies:  No results found.              LOS: 12 days   Andrew Harding  [facility] [employer] adjuster on [email or web address]. If 7PM-7AM, please contact night-coverage at [email or web address]     05/22/2023, 4:27 PM

## 2023-05-22 NOTE — Telephone Encounter (Signed)
Patient's wife, [name] dropped off Family Medical Leave paperwork that needs to be filled out ASAP. Putting paperwork in nurses box to be billed out. Phone number and instructions on front of envelope.

## 2023-05-22 NOTE — Telephone Encounter (Signed)
Patient's spouse left a voicemail on my line regarding this patient and needing the paperwork completed ASAP. I contacted the patient and advised that Sheppard Plumber NP completes the paperwork and their is a 5-7 day turn around time and she would get a call when it was completed.

## 2023-05-22 NOTE — Plan of Care (Signed)

## 2023-05-23 DIAGNOSIS — M86172 Other acute osteomyelitis, left ankle and foot: Secondary | ICD-10-CM | POA: Diagnosis not present

## 2023-05-23 LAB — GLUCOSE, CAPILLARY
Glucose-Capillary: 155 mg/dL — ABNORMAL HIGH (ref 70–99)
Glucose-Capillary: 193 mg/dL — ABNORMAL HIGH (ref 70–99)
Glucose-Capillary: 240 mg/dL — ABNORMAL HIGH (ref 70–99)
Glucose-Capillary: 205 mg/dL — ABNORMAL HIGH (ref 70–99)
Glucose-Capillary: 213 mg/dL — ABNORMAL HIGH (ref 70–99)

## 2023-05-23 MED ORDER — OXYCODONE HCL 5 MG PO TABS: 5.0000 mg | ORAL_TABLET | Freq: Four times a day (QID) | ORAL | 0 refills | Status: DC | PRN

## 2023-05-23 NOTE — Plan of Care (Signed)

## 2023-05-23 NOTE — Progress Notes (Signed)
PROGRESS NOTE    Andrew Harding  ION:[id number] DOB: 1950-10-17 DOA: 05/10/2023 PCP: Lynnea Ferrier, MD    Brief Narrative:   Andrew Harding is a 73 y.o. male with medical history significant of insulin-dependent DM 2, A-fib on Eliquis, HTN, CAD status post CABG, CKD 3B, PAD s/p transmetatarsal amputation left foot presenting w/ sepsis, suspected LLE cellulitis vs. Osteomyelitis. Pt reports worsening pain over L foot for multiple weeks.  Noted to have been admitted 03/2023 for issues including MSSA bacteremia, diabetic foot infection.Has had outpt follow up w/ ID and podiatry since discharge.    Noted recent evaluation w/ ID  cultures showing enterobacter, klebsiella and MSSA in the recent culture taken from the lateral wound- transitioned from cefadroxil to cipro. No reported medical noncompliance. Had follow up with podiatry 05/03/2023 w/ plan for MRI and vascular referral.    On presentation patient was febrile at 100.5, heart rate 100s, WBC 10.8, sodium 127, creatinine 1.42, glucose 242, lactic acid 2.3>>1.1.  DG left foot with soft tissue swelling.  Chest x-ray normal.  Procalcitonin 0.48. Vascular surgery and podiatry was consulted. Started on broad-spectrum antibiotics. MRI of the left foot was concerning for osteomyelitis.  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   Completed course of IV cefepime on 05/20/2023. Continue analgesics as needed for pain. He is okay for discharge from vascular surgeon's standpoint.       Transient hypotension Resolved     Hypokalemia Improved     Atrial fibrillation (HCC) Rate controlled. Continue Eliquis and metoprolol     Type 2 diabetes mellitus with hyperglycemia Overall, glucose levels are optimal.   Continue basal bolus regimen Carb modified diet     Comorbidities include CAD s/p CABG, hypertension, mixed hyperlipidemia, CKD stage IIIa, anemia of chronic disease       He is medically stable for discharge.  Awaiting insurance authorization for discharge to SNF.    DVT prophylaxis: Eliquis Code Status: Full Family Communication: None Disposition Plan: Status is: Inpatient Remains inpatient appropriate because: Unsafe discharge plan.  Medically appropriate for discharge to skilled nursing facility.   Level of care: Med-Surg  Consultants:  None  Procedures:  Left BKA  Antimicrobials: None   Subjective: Seen and examined.  Sitting comfortably in bed.  No visible distress.  No complaints of pain.  Objective: Vitals:   05/22/23 1733 05/22/23 2007 05/23/23 0220 05/23/23 0753  BP: 102/65 110/68 103/72 115/76  Pulse: 74 81 70 89  Resp: 18 18 18 16   Temp: 98.6 F (37 C) 98.7 F (37.1 C) 97.6 F (36.4 C) 97.7 F (36.5 C)  TempSrc: Oral Oral Oral Oral  SpO2: 99% 98% 98% 97%  Weight:      Height:        Intake/Output Summary (Last 24 hours) at 05/23/2023 1423 Last  data filed at 05/23/2023 0930 Gross per 24 hour  Intake 120 ml  Output 250 ml  Net -130 ml   Filed Weights   05/10/23 0931 05/16/23 1103  Weight: 101 kg 101 kg    Examination:  General exam: Appears calm and comfortable  Respiratory system: Clear to auscultation. Respiratory effort normal. Cardiovascular system: S1-S2, RRR, no murmurs, no  pedal edema Gastrointestinal system: Soft, NT/ND, normal bowel sounds Central nervous system: Alert and oriented. No focal neurological deficits. Extremities: Left BKA Skin: No rashes, lesions or ulcers Psychiatry: Judgement and insight appear normal. Mood & affect appropriate.     Data Reviewed: I have personally reviewed following labs and imaging studies  CBC: Recent Labs  Lab 05/17/23 0535 05/18/23 0720  WBC 8.5 7.8  NEUTROABS 7.1  --   HGB 9.7* 9.4*  HCT 29.3* 28.1*  MCV 86.7 84.4  PLT 252 242   Basic Metabolic Panel: Recent Labs  Lab 05/17/23 0535 05/18/23 0720  NA 136 137  K 3.4* 3.7  CL 109 108  CO2 19* 21*  GLUCOSE 172* 172*  BUN 9 10  CREATININE 1.13 1.05  CALCIUM 8.0* 8.1*  MG 1.8  --    GFR: Estimated Creatinine Clearance: 74.9 mL/min (by C-G formula based on SCr of 1.05 mg/dL). Liver Function Tests: No results for input(s): "AST", "ALT", "ALKPHOS", "BILITOT", "PROT", "ALBUMIN" in the last 168 hours. No results for input(s): "LIPASE", "AMYLASE" in the last 168 hours. No results for input(s): "AMMONIA" in the last 168 hours. Coagulation Profile: No results for input(s): "INR", "PROTIME" in the last 168 hours. Cardiac Enzymes: No results for input(s): "CKTOTAL", "CKMB", "CKMBINDEX", "TROPONINI" in the last 168 hours. BNP (last 3 results) No results for input(s): "PROBNP" in the last 8760 hours. HbA1C: No results for input(s): "HGBA1C" in the last 72 hours. CBG: Recent Labs  Lab 05/22/23 1145 05/22/23 1734 05/22/23 2101 05/23/23 0755 05/23/23 1120  GLUCAP 198* 176* 173* 155* 193*   Lipid Profile: No results for input(s): "CHOL", "HDL", "LDLCALC", "TRIG", "CHOLHDL", "LDLDIRECT" in the last 72 hours. Thyroid Function Tests: No results for input(s): "TSH", "T4TOTAL", "FREET4", "T3FREE", "THYROIDAB" in the last 72 hours. Anemia Panel: No results for input(s): "VITAMINB12", "FOLATE", "FERRITIN", "TIBC", "IRON", "RETICCTPCT" in the last 72  hours. Sepsis Labs: No results for input(s): "PROCALCITON", "LATICACIDVEN" in the last 168 hours.  No results found for this or any previous visit (from the past 240 hours).       Radiology Studies: No results found.      Scheduled Meds:  apixaban  5 mg Oral BID   insulin aspart  0-15 Units Subcutaneous TID WC   insulin aspart  0-5 Units Subcutaneous QHS   insulin glargine-yfgn  22 Units Subcutaneous Daily   metoprolol tartrate  25 mg Oral BID   Continuous Infusions:   LOS: 13 days   Tresa Moore, MD [facility]   If 7PM-7AM, please contact night-coverage  05/23/2023, 2:23 PM

## 2023-05-23 NOTE — Progress Notes (Signed)
Physical Therapy Treatment Patient Details Name: Andrew Harding MRN: [id number] DOB: 1950-06-06 Today's Date: 05/23/2023   History of Present Illness Andrew Harding is a 73 y.o. male with medical history significant of insulin-dependent DM 2, A-fib on Eliquis, HTN, CAD status post CABG, CKD 3B, PAD s/p transmetatarsal amputation left foot presenting w/ sepsis, suspected LLE cellulitis vs. Osteomyelitis. 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    PT Comments  Pt moves with good relative confidence and displays functional strength and safety awareness.  He is able to rise from relatively low height (pt is 6'3") w/o assist, minimal cuing for UEs/set up but no LOBs or unsteadiness with transitions for with multiple bouts of ambulation in the hallway.  Pt displays good awareness with HEP and continues to maintain appropriate ROM/strength/mobility in amputated limb. Pt will benefit from ongoing PT, continue with POC.    If plan is discharge home, recommend the following: A lot of help with bathing/dressing/bathroom;A little help with walking and/or transfers;Assistance with cooking/housework;Assist for transportation;Help with stairs or ramp for entrance   Can travel by private vehicle     Yes  Equipment Recommendations  None recommended by PT    Recommendations for Other Services       Precautions / Restrictions Precautions Precautions: Fall Required Braces or Orthoses:  (wearing shrinker) Restrictions Weight Bearing Restrictions Per Provider Order: Yes LLE Weight Bearing Per Provider Order: Non weight bearing     Mobility  Bed Mobility Overal bed mobility: Modified Independent             General bed mobility comments: easily gets himself up to sitting EOB    Transfers Overall transfer level: Needs assistance Equipment used: Rolling walker (2 wheels) Transfers: Sit to/from Stand Sit to Stand: Contact guard assist   Step pivot transfers: Contact guard assist       General transfer comment: from  standard height bed and recliner - cuing for UE use but able to rise safely from various surfaces w/o direct assist    Ambulation/Gait Ambulation/Gait assistance: Supervision Gait Distance (Feet): 70 Feet Assistive device: Rolling walker (2 wheels)         General Gait Details: 40 ft, then 30 ft after seated rest break.  He endorsed arm/tricep fatigue as biggest limiter for prolonged ambulation - walker appropriately set low to allow for less UE fatigue during hop-to gait.  .  Pt with no LOBs or safety issues and vitals remain appropriate for exertion but he did request to sit relatively abruptly after good hopping efforts.   Stairs             Wheelchair Mobility     Tilt Bed    Modified Rankin (Stroke Patients Only)       Balance Overall balance assessment: Needs assistance Sitting-balance support: Feet supported Sitting balance-Leahy Scale: Normal Sitting balance - Comments: able to don shoes independently while sitting at edge of bed     Standing balance-Leahy Scale: Good Standing balance comment: heavy and expected use of walker to maintain single leg/hopping balance.  No LOBs, good confidence before relatively quick fatigue.                            Cognition Arousal: Alert Behavior During Therapy: WFL for tasks assessed/performed Overall Cognitive Status: Within Functional Limits for tasks assessed  Exercises Amputee Exercises Quad Sets: Strengthening, 10 reps Gluteal Sets: Strengthening, 10 reps Hip Extension: Strengthening, 10 reps Hip ABduction/ADduction: Strengthening, 10 reps Hip Flexion/Marching: Strengthening, 10 reps Knee Extension: Strengthening, 10 reps (SAQ over pillow) Straight Leg Raises: AROM, 10 reps    General Comments General comments (skin integrity, edema, etc.): Pt was able to move with ease and confidence but did fatigue quickly with standing acts.       Pertinent Vitals/Pain Pain Assessment Pain Assessment: 0-10 Pain Score: 3  Pain Location: residual limb (reports no phantom pain)    Home Living                          Prior Function            PT Goals (current goals can now be found in the care plan section) Progress towards PT goals: Progressing toward goals    Frequency    7X/week      PT Plan      Co-evaluation              AM-PAC PT "6 Clicks" Mobility   Outcome Measure  Help needed turning from your back to your side while in a flat bed without using bedrails?: None Help needed moving from lying on your back to sitting on the side of a flat bed without using bedrails?: None Help needed moving to and from a bed to a chair (including a wheelchair)?: A Little Help needed standing up from a chair using your arms (e.g., wheelchair or bedside chair)?: A Little Help needed to walk in hospital room?: A Little Help needed climbing 3-5 steps with a railing? : A Lot 6 Click Score: 19    End of Session Equipment Utilized During Treatment: Gait belt Activity Tolerance: Patient tolerated treatment well;Patient limited by fatigue Patient left: in chair;with call bell/phone within reach Nurse Communication: Mobility status PT Visit Diagnosis: Unsteadiness on feet (R26.81);Difficulty in walking, not elsewhere classified (R26.2);Other abnormalities of gait and mobility (R26.89)     Time: 0981-1914 PT Time Calculation (min) (ACUTE ONLY): 16 min  Charges:    $Therapeutic Exercise: 8-22 mins PT General Charges $$ ACUTE PT VISIT: 1 Visit                     Malachi Pro, DPT 05/23/2023, 1:33 PM

## 2023-05-23 NOTE — Plan of Care (Signed)
Problem: Fluid Volume: Goal: Hemodynamic stability will improve 05/23/2023 1925 by Oneita Kras, RN Outcome: Progressing 05/23/2023 1925 by Oneita Kras, RN Outcome: Progressing   Problem: Clinical Measurements: Goal: Diagnostic test results will improve 05/23/2023 1925 by Oneita Kras, RN Outcome: Progressing 05/23/2023 1925 by Oneita Kras, RN Outcome: Progressing Goal: Signs and symptoms of infection will decrease 05/23/2023 1925 by Oneita Kras, RN Outcome: Progressing 05/23/2023 1925 by Oneita Kras, RN Outcome: Progressing   Problem: Respiratory: Goal: Ability to maintain adequate ventilation will improve 05/23/2023 1925 by Oneita Kras, RN Outcome: Progressing 05/23/2023 1925 by Oneita Kras, RN Outcome: Progressing   Problem: Education: Goal: Ability to describe self-care measures that may prevent or decrease complications (Diabetes Survival Skills Education) will improve 05/23/2023 1925 by Oneita Kras, RN Outcome: Progressing 05/23/2023 1925 by Oneita Kras, RN Outcome: Progressing Goal: Individualized Educational Video(s) 05/23/2023 1925 by Oneita Kras, RN Outcome: Progressing 05/23/2023 1925 by Oneita Kras, RN Outcome: Progressing   Problem: Coping: Goal: Ability to adjust to condition or change in health will improve 05/23/2023 1925 by Oneita Kras, RN Outcome: Progressing 05/23/2023 1925 by Oneita Kras, RN Outcome: Progressing   Problem: Fluid Volume: Goal: Ability to maintain a balanced intake and output will improve 05/23/2023 1925 by Oneita Kras, RN Outcome: Progressing 05/23/2023 1925 by Oneita Kras, RN Outcome: Progressing   Problem: Health Behavior/Discharge Planning: Goal: Ability to identify and utilize available resources and services will improve 05/23/2023 1925 by Oneita Kras, RN Outcome:  Progressing 05/23/2023 1925 by Oneita Kras, RN Outcome: Progressing Goal: Ability to manage health-related needs will improve 05/23/2023 1925 by Oneita Kras, RN Outcome: Progressing 05/23/2023 1925 by Oneita Kras, RN Outcome: Progressing   Problem: Metabolic: Goal: Ability to maintain appropriate glucose levels will improve 05/23/2023 1925 by Oneita Kras, RN Outcome: Progressing 05/23/2023 1925 by Oneita Kras, RN Outcome: Progressing   Problem: Nutritional: Goal: Maintenance of adequate nutrition will improve 05/23/2023 1925 by Oneita Kras, RN Outcome: Progressing 05/23/2023 1925 by Oneita Kras, RN Outcome: Progressing Goal: Progress toward achieving an optimal weight will improve 05/23/2023 1925 by Oneita Kras, RN Outcome: Progressing 05/23/2023 1925 by Oneita Kras, RN Outcome: Progressing   Problem: Skin Integrity: Goal: Risk for impaired skin integrity will decrease 05/23/2023 1925 by Oneita Kras, RN Outcome: Progressing 05/23/2023 1925 by Oneita Kras, RN Outcome: Progressing   Problem: Tissue Perfusion: Goal: Adequacy of tissue perfusion will improve 05/23/2023 1925 by Oneita Kras, RN Outcome: Progressing 05/23/2023 1925 by Oneita Kras, RN Outcome: Progressing   Problem: Education: Goal: Knowledge of General Education information will improve Description: Including pain rating scale, medication(s)/side effects and non-pharmacologic comfort measures 05/23/2023 1925 by Oneita Kras, RN Outcome: Progressing 05/23/2023 1925 by Oneita Kras, RN Outcome: Progressing   Problem: Health Behavior/Discharge Planning: Goal: Ability to manage health-related needs will improve 05/23/2023 1925 by Oneita Kras, RN Outcome: Progressing 05/23/2023 1925 by Oneita Kras, RN Outcome: Progressing   Problem: Clinical  Measurements: Goal: Ability to maintain clinical measurements within normal limits will improve 05/23/2023 1925 by Oneita Kras, RN Outcome: Progressing 05/23/2023 1925 by Oneita Kras, RN Outcome: Progressing Goal: Will remain free from infection 05/23/2023 1925 by Oneita Kras, RN Outcome: Progressing 05/23/2023 1925 by Oneita Kras, RN Outcome: Progressing Goal: Diagnostic test results will improve 05/23/2023 1925 by Oneita Kras, RN Outcome: Progressing 05/23/2023  1925 by Oneita Kras, RN Outcome: Progressing Goal: Respiratory complications will improve 05/23/2023 1925 by Oneita Kras, RN Outcome: Progressing 05/23/2023 1925 by Oneita Kras, RN Outcome: Progressing Goal: Cardiovascular complication will be avoided 05/23/2023 1925 by Oneita Kras, RN Outcome: Progressing 05/23/2023 1925 by Oneita Kras, RN Outcome: Progressing   Problem: Activity: Goal: Risk for activity intolerance will decrease 05/23/2023 1925 by Oneita Kras, RN Outcome: Progressing 05/23/2023 1925 by Oneita Kras, RN Outcome: Progressing   Problem: Nutrition: Goal: Adequate nutrition will be maintained 05/23/2023 1925 by Oneita Kras, RN Outcome: Progressing 05/23/2023 1925 by Oneita Kras, RN Outcome: Progressing   Problem: Coping: Goal: Level of anxiety will decrease 05/23/2023 1925 by Oneita Kras, RN Outcome: Progressing 05/23/2023 1925 by Oneita Kras, RN Outcome: Progressing   Problem: Elimination: Goal: Will not experience complications related to bowel motility 05/23/2023 1925 by Oneita Kras, RN Outcome: Progressing 05/23/2023 1925 by Oneita Kras, RN Outcome: Progressing Goal: Will not experience complications related to urinary retention 05/23/2023 1925 by Oneita Kras, RN Outcome: Progressing 05/23/2023 1925 by Oneita Kras, RN Outcome: Progressing   Problem: Pain Management: Goal: General experience of comfort will improve 05/23/2023 1925 by Oneita Kras, RN Outcome: Progressing 05/23/2023 1925 by Oneita Kras, RN Outcome: Progressing   Problem: Safety: Goal: Ability to remain free from injury will improve 05/23/2023 1925 by Oneita Kras, RN Outcome: Progressing 05/23/2023 1925 by Oneita Kras, RN Outcome: Progressing   Problem: Skin Integrity: Goal: Risk for impaired skin integrity will decrease 05/23/2023 1925 by Oneita Kras, RN Outcome: Progressing 05/23/2023 1925 by Oneita Kras, RN Outcome: Progressing

## 2023-05-23 NOTE — TOC Progression Note (Addendum)
Transition of Care [facility]) - Progression Note    Patient Details  Name: Andrew Harding MRN: [id number] Date of Birth: 01-07-1951  Transition of Care [facility]) CM/SW Contact  Erin Sons, [place] Phone Number: 05/23/2023, 10:44 AM  Clinical Narrative:     Babette Relic is off today; CSW spoke with Almira Coaster at [facility]. Almira Coaster explains that they have Medicare part A as pt's primary insurance. She is calling [employer] to confirm that Medicare part A is is primary; if they can confirm this, pt can admit today.   1430: CSW called and left voicemail with Gina at [facility] requesting update.   1545:  CSW received voicemail from Fisher at [facility] and informed that when she spoke with Rosann Auerbach, they clarified that pt's [employer] plan IS his primary insurance. [facility] is out of network with his Rosann Auerbach and therefore cannot offer bed for pt. Pt currently has no additional bed offers. CSW to expand SNF bed search.    Expected Discharge Plan: Skilled Nursing Facility Barriers to Discharge: Continued Medical Work up  Expected Discharge Plan and Services     Post Acute Care Choice: Skilled Nursing Facility Living arrangements for the past 2 months: Single Family Home                                       Social Determinants of Health (SDOH) Interventions SDOH Screenings   Food Insecurity: No Food Insecurity (05/12/2023)  Housing: Low Risk  (05/12/2023)  Transportation Needs: No Transportation Needs (05/12/2023)  Utilities: Not At Risk (05/12/2023)  Depression (PHQ2-9): Low Risk  (05/03/2023)  Financial Resource Strain: Low Risk  (12/13/2022)   Received from [facility]  Social Connections: Moderately Isolated (05/12/2023)  Tobacco Use: Medium Risk (05/16/2023)    Readmission Risk Interventions    02/28/2023   12:53 PM  Readmission Risk Prevention Plan  Transportation Screening Complete  PCP or Specialist Appt within 3-5 Days Complete  HRI or Home Care Consult Complete  Social Work Consult for  Recovery Care Planning/Counseling Complete  Palliative Care Screening Not Applicable  Medication Review [employer]) Complete

## 2023-05-24 ENCOUNTER — Ambulatory Visit: Payer: Managed Care, Other (non HMO) | Admitting: Infectious Diseases

## 2023-05-24 DIAGNOSIS — M86172 Other acute osteomyelitis, left ankle and foot: Secondary | ICD-10-CM | POA: Diagnosis not present

## 2023-05-24 LAB — GLUCOSE, CAPILLARY
Glucose-Capillary: 161 mg/dL — ABNORMAL HIGH (ref 70–99)
Glucose-Capillary: 216 mg/dL — ABNORMAL HIGH (ref 70–99)
Glucose-Capillary: 111 mg/dL — ABNORMAL HIGH (ref 70–99)
Glucose-Capillary: 164 mg/dL — ABNORMAL HIGH (ref 70–99)
Glucose-Capillary: 152 mg/dL — ABNORMAL HIGH (ref 70–99)

## 2023-05-24 NOTE — Progress Notes (Signed)
Physical Therapy Treatment Patient Details Name: Andrew Harding MRN: [id number] DOB: 1950/10/03 Today's Date: 05/24/2023   History of Present Illness Andrew Harding is a 73 y.o. male with medical history significant of insulin-dependent DM 2, A-fib on Eliquis, HTN, CAD status post CABG, CKD 3B, PAD s/p transmetatarsal amputation left foot presenting w/ sepsis, suspected LLE cellulitis vs. Osteomyelitis. 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    PT Comments  Pt is A and O x 4. Does voice some frustration about lack of placement and DC options. Pt does voice considering DC directly home. MD made aware. Pt will discuss with his spouse later this evening. Pt was safely able to exit bed, stand to RW, ambulate 120 ft with RW without LOB. Does fatigue quickly but is to be expected. After ambulation in hallway, pt perform supine, side lying, and prone exercises. Author will return in the morning to perform stairs to simulate home entry.    If plan is discharge home, recommend the following: A little help with walking and/or transfers;A little help with bathing/dressing/bathroom;Assistance with cooking/housework;Assist for transportation;Help with stairs or ramp for entrance     Equipment Recommendations  Wheelchair (measurements PT);Wheelchair cushion (measurements PT);BSC/3in1 (if planning to DC directly home)       Precautions / Restrictions Precautions Precautions: Fall Precaution Comments: L BKA Other Brace: LLE shrinker Restrictions Weight Bearing Restrictions Per Provider Order: Yes LLE Weight Bearing Per Provider Order: Non weight bearing     Mobility  Bed Mobility Overal bed mobility: Modified Independent   Transfers Overall transfer level: Modified independent Equipment used: Rolling walker (2 wheels) Transfers: Sit to/from Stand Sit to Stand: Modified independent (Device/Increase time)   Ambulation/Gait Ambulation/Gait assistance: Supervision Gait Distance (Feet): 120 Feet Assistive device: Rolling  walker (2 wheels) Gait Pattern/deviations: Step-to pattern  General Gait Details: pt was able to advance gait distances to 1 x 120 ft with 2 standing rest. no LOB or safety concerns. Did discuss home set up and need to attempt stairs prior to [facility] home if he does change to wanting to dc directly home. pt requested to wait till morning to attempt stair but was willing to perform HEP after ambulating   Stairs  General stair comments: will perform stair training in the morning   Balance Overall balance assessment: Needs assistance Sitting-balance support: Feet supported Sitting balance-Leahy Scale: Normal     Standing balance support: During functional activity, Reliant on assistive device for balance, Single extremity supported Standing balance-Leahy Scale: Good       Cognition Arousal: Alert Behavior During Therapy: WFL for tasks assessed/performed Overall Cognitive Status: Within Functional Limits for tasks assessed  General Comments: Pt is  A and O x 4. does voice frustration about lack of rehab bed offers        Exercises Total Joint Exercises Ankle Circles/Pumps: AROM, Strengthening, Left, 10 reps Long Arc Quad: AROM, Strengthening, Left, 5 reps Amputee Exercises Quad Sets: AROM, 10 reps Gluteal Sets: 10 reps Hip Extension: AROM, 10 reps, Prone Hip ABduction/ADduction: Sidelying, AROM, 10 reps        Pertinent Vitals/Pain Pain Assessment Pain Assessment: No/denies pain Pain Score: 0-No pain Pain Intervention(s): Limited activity within patient's tolerance, Monitored during session     PT Goals (current goals can now be found in the care plan section) Acute Rehab PT Goals Patient Stated Goal: " get so I wont have to rely on my wife when I go home." Progress towards PT goals: Progressing toward goals    Frequency  7X/week       AM-PAC PT "6 Clicks" Mobility   Outcome Measure  Help needed turning from your back to your side while in a flat bed without  using bedrails?: None Help needed moving from lying on your back to sitting on the side of a flat bed without using bedrails?: None Help needed moving to and from a bed to a chair (including a wheelchair)?: A Little Help needed standing up from a chair using your arms (e.g., wheelchair or bedside chair)?: A Little Help needed to walk in hospital room?: A Little Help needed climbing 3-5 steps with a railing? : A Little 6 Click Score: 20    End of Session Equipment Utilized During Treatment: Gait belt Activity Tolerance: Patient tolerated treatment well Patient left: in bed;with call bell/phone within reach;with bed alarm set Nurse Communication: Mobility status PT Visit Diagnosis: Unsteadiness on feet (R26.81);Difficulty in walking, not elsewhere classified (R26.2);Other abnormalities of gait and mobility (R26.89)     Time: 9562-1308 PT Time Calculation (min) (ACUTE ONLY): 23 min  Charges:    $Gait Training: 8-22 mins $Therapeutic Exercise: 8-22 mins PT General Charges $$ ACUTE PT VISIT: 1 Visit                    Jetta Lout PTA 05/24/23, 5:21 PM

## 2023-05-24 NOTE — Progress Notes (Signed)
PROGRESS NOTE    Andrew Harding  LOV:[id number] DOB: 1951-03-08 DOA: 05/10/2023 PCP: Lynnea Ferrier, MD    Brief Narrative:   Andrew Harding is a 73 y.o. male with medical history significant of insulin-dependent DM 2, A-fib on Eliquis, HTN, CAD status post CABG, CKD 3B, PAD s/p transmetatarsal amputation left foot presenting w/ sepsis, suspected LLE cellulitis vs. Osteomyelitis. Pt reports worsening pain over L foot for multiple weeks.  Noted to have been admitted 03/2023 for issues including MSSA bacteremia, diabetic foot infection.Has had outpt follow up w/ ID and podiatry since discharge.    Noted recent evaluation w/ ID  cultures showing enterobacter, klebsiella and MSSA in the recent culture taken from the lateral wound- transitioned from cefadroxil to cipro. No reported medical noncompliance. Had follow up with podiatry 05/03/2023 w/ plan for MRI and vascular referral.    On presentation patient was febrile at 100.5, heart rate 100s, WBC 10.8, sodium 127, creatinine 1.42, glucose 242, lactic acid 2.3>>1.1.  DG left foot with soft tissue swelling.  Chest x-ray normal.  Procalcitonin 0.48. Vascular surgery and podiatry was consulted. Started on broad-spectrum antibiotics. MRI of the left foot was concerning for osteomyelitis.  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   Completed course of IV cefepime on 05/20/2023. Continue analgesics as needed for pain. He is okay for discharge from vascular surgeon's standpoint. Medically ready for discharge.  Pending skilled nursing facility authorization and placement       Transient hypotension Resolved     Hypokalemia Improved     Atrial fibrillation (HCC) Rate controlled. Continue Eliquis and metoprolol     Type 2 diabetes mellitus with hyperglycemia Overall, glucose levels are optimal.   Continue basal bolus regimen Carb modified diet     Comorbidities include CAD s/p CABG, hypertension, mixed hyperlipidemia, CKD stage IIIa, anemia of chronic disease       He is medically stable for discharge.  Awaiting insurance authorization for discharge to SNF.    DVT prophylaxis: Eliquis Code Status: Full Family Communication: None Disposition Plan: Status is: Inpatient Remains inpatient appropriate because: Unsafe discharge plan.  Medically appropriate for discharge to skilled nursing facility.   Level of care: Med-Surg  Consultants:  None  Procedures:  Left BKA  Antimicrobials: None   Subjective: Seen and examined.  Resting in bed.  No visible distress  Objective: Vitals:   05/23/23 1448 05/23/23 2050 05/24/23 0450 05/24/23 0848  BP: 109/65 111/71 110/68 122/77  Pulse: 74 90 88 78  Resp: 16 20 20 18   Temp: 99.1 F (37.3 C) 98.1 F (36.7 C) 98.3 F (36.8 C) 98.1 F (36.7 C)  TempSrc: Oral Oral Oral Oral  SpO2: 100% 99% 99% 100%  Weight:      Height:        Intake/Output Summary (Last  24 hours) at 05/24/2023 1143 Last data filed at 05/24/2023 1049 Gross per 24 hour  Intake 240 ml  Output --  Net 240 ml   Filed Weights   05/10/23 0931 05/16/23 1103  Weight: 101 kg 101 kg    Examination:  General exam: NAD Respiratory system: Clear to auscultation. Respiratory effort normal. Cardiovascular system:  S1-S2, RRR, no murmurs, no pedal edema Gastrointestinal system: Soft, NT/ND, normal bowel sounds Central nervous system: Alert and oriented. No focal neurological deficits. Extremities: Status post left BKA Skin: No rashes, lesions or ulcers Psychiatry: Judgement and insight appear normal. Mood & affect appropriate.     Data Reviewed: I have personally reviewed following labs and imaging studies  CBC: Recent Labs  Lab 05/18/23 0720  WBC 7.8  HGB 9.4*  HCT 28.1*  MCV 84.4  PLT 242   Basic Metabolic Panel: Recent Labs  Lab 05/18/23 0720  NA 137  K 3.7  CL 108  CO2 21*  GLUCOSE 172*  BUN 10  CREATININE 1.05  CALCIUM 8.1*   GFR: Estimated Creatinine Clearance: 74.9 mL/min (by C-G formula based on SCr of 1.05 mg/dL). Liver Function Tests: No results for input(s): "AST", "ALT", "ALKPHOS", "BILITOT", "PROT", "ALBUMIN" in the last 168 hours. No results for input(s): "LIPASE", "AMYLASE" in the last 168 hours. No results for input(s): "AMMONIA" in the last 168 hours. Coagulation Profile: No results for input(s): "INR", "PROTIME" in the last 168 hours. Cardiac Enzymes: No results for input(s): "CKTOTAL", "CKMB", "CKMBINDEX", "TROPONINI" in the last 168 hours. BNP (last 3 results) No results for input(s): "PROBNP" in the last 8760 hours. HbA1C: No results for input(s): "HGBA1C" in the last 72 hours. CBG: Recent Labs  Lab 05/23/23 1450 05/23/23 1702 05/23/23 2052 05/24/23 0814 05/24/23 1126  GLUCAP 240* 205* 213* 161* 216*   Lipid Profile: No results for input(s): "CHOL", "HDL", "LDLCALC", "TRIG", "CHOLHDL", "LDLDIRECT" in the last 72 hours. Thyroid Function Tests: No results for input(s): "TSH", "T4TOTAL", "FREET4", "T3FREE", "THYROIDAB" in the last 72 hours. Anemia Panel: No results for input(s): "VITAMINB12", "FOLATE", "FERRITIN", "TIBC", "IRON", "RETICCTPCT" in the last 72 hours. Sepsis Labs: No results for input(s): "PROCALCITON", "LATICACIDVEN" in the last  168 hours.  No results found for this or any previous visit (from the past 240 hours).       Radiology Studies: No results found.      Scheduled Meds:  apixaban  5 mg Oral BID   insulin aspart  0-15 Units Subcutaneous TID WC   insulin aspart  0-5 Units Subcutaneous QHS   insulin glargine-yfgn  22 Units Subcutaneous Daily   metoprolol tartrate  25 mg Oral BID   Continuous Infusions:   LOS: 14 days   Tresa Moore, MD [facility]   If 7PM-7AM, please contact night-coverage  05/24/2023, 11:43 AM

## 2023-05-24 NOTE — Progress Notes (Signed)
Occupational Therapy Treatment Patient Details Name: Andrew Harding MRN: [id number] DOB: 1950-09-17 Today's Date: 05/24/2023   History of present illness Andrew Harding is a 73 y.o. male with medical history significant of insulin-dependent DM 2, A-fib on Eliquis, HTN, CAD status post CABG, CKD 3B, PAD s/p transmetatarsal amputation left foot presenting w/ sepsis, suspected LLE cellulitis vs. Osteomyelitis. 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   OT comments  Pt is supine in bed on arrival. Pleasant and agreeable to OT session. He does not mention pain. Pt performed bed mobility MOD I, STS from EOB to RW with CGA and hopped to the bathroom and back using RW with heavy reliance of BUEs on RW with CGA. Toileting performed with CGA/SBA. Ambulated back to EOB and performed grooming, bathing and dressing tasks seated EOB/standing at sink with constant unilateral support to maintain balance. Overall, he is progressing well and needs mostly CGA for safety in standing with dynamic/functional tasks. He was left seated EOB with set up to shave his face. Secure chat sent to [facility] during visit as requested by patient to get an update on DC plan. TOC entered the room as OT exicting. All needs in place and will cont to require skilled acute OT services to maximize his safety and IND to return to PLOF.       If plan is discharge home, recommend the following:  A little help with walking and/or transfers;A little help with bathing/dressing/bathroom;Help with stairs or ramp for entrance   Equipment Recommendations  BSC/3in1    Recommendations for Other Services      Precautions / Restrictions Precautions Precautions: Fall Precaution Comments: L BKA Other Brace: LLE shrinker Restrictions Weight Bearing Restrictions Per Provider Order: Yes LLE Weight Bearing Per Provider Order: Non weight bearing       Mobility Bed Mobility Overal bed mobility: Modified Independent                  Transfers Overall transfer level:  Needs assistance Equipment used: Rolling walker (2 wheels) Transfers: Sit to/from Stand Sit to Stand: Contact guard assist           General transfer comment: CGA for STS from EOB at lowest height and CGA to hop to the bathroom with BUE support on RW     Balance Overall balance assessment: Needs assistance Sitting-balance support: Feet supported Sitting balance-Leahy Scale: Normal     Standing balance support: During functional activity, Reliant on assistive device for balance, Single extremity supported Standing balance-Leahy Scale: Good Standing balance comment: no LOB while standing to perform tasks as long as he had unilateral support from UE on sink                           ADL either performed or assessed with clinical judgement   ADL Overall ADL's : Needs assistance/impaired     Grooming: Wash/dry face;Wash/dry hands;Sitting Grooming Details (indicate cue type and reason): set up assist for grooming tasks seated at EOB Upper Body Bathing: Set up Upper Body Bathing Details (indicate cue type and reason): set up assist to bathe UB seated EOB Lower Body Bathing: Sit to/from stand;Supervison/ safety Lower Body Bathing Details (indicate cue type and reason): SUP/SBA for safety in standing with unilateral support on sink to maintain balance while bathing peri-area/buttocks Upper Body Dressing : Set up;Sitting       Toilet Transfer: Contact guard assist;Rolling walker (2 wheels);BSC/3in1;Ambulation Toilet Transfer Details (indicate cue type and  reason): cues for hand placement, BSC over commode to raise height and offer use of grab bars to offset UE fatigue due to "hopping" with heavy UE use on RW Toileting- Clothing Manipulation and Hygiene: Sitting/lateral lean;Supervision/safety Toileting - Clothing Manipulation Details (indicate cue type and reason): SUP seated on toilet via lateral lean            Extremity/Trunk Assessment              Vision        Perception     Praxis      Cognition Arousal: Alert Behavior During Therapy: WFL for tasks assessed/performed Overall Cognitive Status: Within Functional Limits for tasks assessed                                          Exercises Other Exercises Other Exercises: Edu on sitting to perform tasks more safely versus trying to stand and lose balance. Edu on when standing to have unilateral support at all times to prevent balance loss.    Shoulder Instructions       General Comments      Pertinent Vitals/ Pain       Pain Assessment Pain Assessment: Faces Faces Pain Scale: Hurts a little bit Pain Location: residual limb Pain Descriptors / Indicators: Sore Pain Intervention(s): Monitored during session, Repositioned  Home Living                                          Prior Functioning/Environment              Frequency  Min 1X/week        Progress Toward Goals  OT Goals(current goals can now be found in the care plan section)  Progress towards OT goals: Progressing toward goals  Acute Rehab OT Goals Patient Stated Goal: improve strength OT Goal Formulation: With patient Time For Goal Achievement: 05/31/23 Potential to Achieve Goals: Good  Plan      Co-evaluation                 AM-PAC OT "6 Clicks" Daily Activity     Outcome Measure   Help from another person eating meals?: None Help from another person taking care of personal grooming?: None Help from another person toileting, which includes using toliet, bedpan, or urinal?: A Little Help from another person bathing (including washing, rinsing, drying)?: A Little Help from another person to put on and taking off regular upper body clothing?: None Help from another person to put on and taking off regular lower body clothing?: A Little 6 Click Score: 21    End of Session Equipment Utilized During Treatment: Rolling walker (2 wheels)  OT Visit  Diagnosis: Other abnormalities of gait and mobility (R26.89);Muscle weakness (generalized) (M62.81)   Activity Tolerance Patient tolerated treatment well   Patient Left in bed;with call bell/phone within reach   Nurse Communication Mobility status        Time: 1411-1444 OT Time Calculation (min): 33 min  Charges: OT General Charges $OT Visit: 1 Visit OT Treatments $Self Care/Home Management : 23-37 mins  Magan Winnett, OTR/L  05/24/23, 3:27 PM   Metta Koranda E Natnael Biederman 05/24/2023, 3:25 PM

## 2023-05-24 NOTE — TOC Progression Note (Signed)
Transition of Care [facility]) - Progression Note    Patient Details  Name: Andrew Harding MRN: [id number] Date of Birth: April 07, 1951  [facility]) CM/SW Contact  Margarito Liner, LCSW Phone Number: 05/24/2023, 4:15 PM  Clinical Narrative:  CSW provided bed offers. Patient asked if [facility] would be an option. He has been their in the past. CSW asked the admissions coordinator to review referral. CSW asked patient to choose a preferred SNF out of those that have offered in case CIR cannot take him.   Expected Discharge Plan: Skilled Nursing Facility Barriers to Discharge: Continued Medical Work up  Expected Discharge Plan and Services     Post Acute Care Choice: Skilled Nursing Facility Living arrangements for the past 2 months: Single Family Home                                       Social Determinants of Health (SDOH) Interventions SDOH Screenings   Food Insecurity: No Food Insecurity (05/12/2023)  Housing: Low Risk  (05/12/2023)  Transportation Needs: No Transportation Needs (05/12/2023)  Utilities: Not At Risk (05/12/2023)  Depression (PHQ2-9): Low Risk  (05/03/2023)  Financial Resource Strain: Low Risk  (12/13/2022)   Received from [facility]  Social Connections: Moderately Isolated (05/12/2023)  Tobacco Use: Medium Risk (05/16/2023)    Readmission Risk Interventions    02/28/2023   12:53 PM  Readmission Risk Prevention Plan  Transportation Screening Complete  PCP or Specialist Appt within 3-5 Days Complete  HRI or Home Care Consult Complete  Social Work Consult for Recovery Care Planning/Counseling Complete  Palliative Care Screening Not Applicable  Medication Review Oceanographer) Complete

## 2023-05-24 NOTE — Progress Notes (Addendum)
Inpatient Rehab Admissions Coordinator Note:   Per updated OT recommendations patient was screened for CIR candidacy by Stephania Fragmin, PT. At this time, pt appears to be a potential candidate for CIR. I will place an order for rehab consult for full assessment, per our protocol.  Pt already mobilizing at supervision level and beds on CIR are limited so may progress to a level to safely discharge directly home.  Please contact me any with questions.Estill Dooms, PT, DPT [phone] 05/24/23 4:28 PM

## 2023-05-25 DIAGNOSIS — M86172 Other acute osteomyelitis, left ankle and foot: Secondary | ICD-10-CM | POA: Diagnosis not present

## 2023-05-25 LAB — GLUCOSE, CAPILLARY
Glucose-Capillary: 166 mg/dL — ABNORMAL HIGH (ref 70–99)
Glucose-Capillary: 172 mg/dL — ABNORMAL HIGH (ref 70–99)
Glucose-Capillary: 131 mg/dL — ABNORMAL HIGH (ref 70–99)
Glucose-Capillary: 115 mg/dL — ABNORMAL HIGH (ref 70–99)
Glucose-Capillary: 187 mg/dL — ABNORMAL HIGH (ref 70–99)

## 2023-05-25 NOTE — Progress Notes (Signed)
Occupational Therapy Treatment Patient Details Name: Andrew Harding MRN: [id number] DOB: 1950/08/26 Today's Date: 05/25/2023   History of present illness Andrew Harding is a 73 y.o. male with medical history significant of insulin-dependent DM 2, A-fib on Eliquis, HTN, CAD status post CABG, CKD 3B, PAD s/p transmetatarsal amputation left foot presenting w/ sepsis, suspected LLE cellulitis vs. Osteomyelitis. 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   OT comments  Pt is supine in bed on arrival. Pleasant and agreeable to OT session. He reports minimal pain to residual limb. Remains frustrated that he has not been discharged to rehab yet, but is hoping he will soon. Session focused on exercises and activities that simulate lateral scoot/SPT in order to carryover to ability to get to W/C safely. CGA for SPT to W/C from EOB with ability to perform W/C mobility with SUP for 1 lap around the nurses station. STS from W/C to RW with CGA and able to hop from door to his bed with RW and CGA/SBA with no LOB noted. Pt does fatigue with this and has been educated on energy conservation and using W/C as needed. Edu on all W/C functions and safety done. Pt returned to bed with all needs in place and will cont to require skilled acute OT services to maximize his safety and IND to return to PLOF.       If plan is discharge home, recommend the following:  A little help with walking and/or transfers;A little help with bathing/dressing/bathroom;Help with stairs or ramp for entrance   Equipment Recommendations  BSC/3in1    Recommendations for Other Services      Precautions / Restrictions Precautions Precautions: Fall Precaution Comments: L BKA Other Brace: LLE shrinker Restrictions Weight Bearing Restrictions Per Provider Order: Yes LLE Weight Bearing Per Provider Order: Non weight bearing       Mobility Bed Mobility Overal bed mobility: Modified Independent                  Transfers   Equipment used: Rolling walker  (2 wheels) Transfers: Sit to/from Stand, Bed to chair/wheelchair/BSC Sit to Stand: Supervision, Contact guard assist   Squat pivot transfers: Supervision       General transfer comment: CGA/SBA for SPT from bed to W/C; able to self propel 1 lap around nurses station, lock/unlock brakes, propel forward/backward, make turns, etc.; STS from W/C with CGA/SBA to RW and hopped from room door back to bed     Balance Overall balance assessment: Needs assistance Sitting-balance support: Feet supported Sitting balance-Leahy Scale: Normal Sitting balance - Comments: able to don shoe independently while sitting at edge of bed   Standing balance support: During functional activity, Reliant on assistive device for balance, Single extremity supported Standing balance-Leahy Scale: Good Standing balance comment: no LOB noted during session this afternoon                           ADL either performed or assessed with clinical judgement   ADL Overall ADL's : Needs assistance/impaired                     Lower Body Dressing: Modified independent Lower Body Dressing Details (indicate cue type and reason): to don shoe seated at EOB                    Extremity/Trunk Assessment              Vision  Perception     Praxis      Cognition Arousal: Alert Behavior During Therapy: WFL for tasks assessed/performed Overall Cognitive Status: Within Functional Limits for tasks assessed                                 General Comments: voices frustrations over still being in hospital and his perception of nothing being done to help get him out of here.        Exercises Other Exercises Other Exercises: Pt performed chair pushups from EOB x10 and x10 lateral scoots by lifting buttocks off bed to simulate transfers to/from W/C, etc.    Shoulder Instructions       General Comments      Pertinent Vitals/ Pain       Pain Assessment Pain  Assessment: 0-10 Pain Score: 1  Pain Location: residual limb Pain Descriptors / Indicators: Sore Pain Intervention(s): Monitored during session, Limited activity within patient's tolerance, Repositioned  Home Living                                          Prior Functioning/Environment              Frequency  Min 1X/week        Progress Toward Goals  OT Goals(current goals can now be found in the care plan section)  Progress towards OT goals: Progressing toward goals  Acute Rehab OT Goals Patient Stated Goal: improve strength OT Goal Formulation: With patient Time For Goal Achievement: 05/31/23 Potential to Achieve Goals: Good  Plan      Co-evaluation                 AM-PAC OT "6 Clicks" Daily Activity     Outcome Measure   Help from another person eating meals?: None Help from another person taking care of personal grooming?: None Help from another person toileting, which includes using toliet, bedpan, or urinal?: A Little Help from another person bathing (including washing, rinsing, drying)?: A Little Help from another person to put on and taking off regular upper body clothing?: None Help from another person to put on and taking off regular lower body clothing?: A Little 6 Click Score: 21    End of Session Equipment Utilized During Treatment: Rolling walker (2 wheels);Other (comment) (wheelchair)  OT Visit Diagnosis: Other abnormalities of gait and mobility (R26.89);Muscle weakness (generalized) (M62.81)   Activity Tolerance Patient tolerated treatment well   Patient Left in bed;with call bell/phone within reach   Nurse Communication Mobility status        Time: 1316-1340 OT Time Calculation (min): 24 min  Charges: OT General Charges $OT Visit: 1 Visit OT Treatments $Therapeutic Activity: 23-37 mins  Andrew Harding, OTR/L  05/25/23, 2:50 PM   Andrew Harding 05/25/2023, 2:47 PM

## 2023-05-25 NOTE — Progress Notes (Signed)
Inpatient Rehab Admissions Coordinator:   Chart reviewed.  Note pt continues to desire post-acute rehab as he lives home alone.  Did not work on stairs this AM.  We will not have beds until next week and pt continues to mobilize at an ambulatory level with only supervision.  Likely would be mod I at w/c level, if he is agreeable.  I will f/u with him on Monday to see how he's doing.   Estill Dooms, PT, DPT Admissions Coordinator [phone] 05/25/23  3:30 PM

## 2023-05-25 NOTE — Progress Notes (Signed)
Physical Therapy Treatment Patient Details Name: Andrew Harding MRN: [id number] DOB: 12/18/1950 Today's Date: 05/25/2023   History of Present Illness Andrew Harding is a 73 y.o. male with medical history significant of insulin-dependent DM 2, A-fib on Eliquis, HTN, CAD status post CABG, CKD 3B, PAD s/p transmetatarsal amputation left foot presenting w/ sepsis, suspected LLE cellulitis vs. Osteomyelitis. 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    PT Comments  Bed mobility without assist.  Dons shoe.  Walks to bathroom to void/BM with min a x 1 for standing tasks.  He is able to walk 120' with RW and cga x 1 with generally steady gait but does fatigue today and when transitioning to chair does have a LOB and needs min a x 1 to guide safely to chair.  Pt left to self select gait distance but does overestimate a bit today.  Wheelchair mobility around unit and practiced squat pivot transfers to/from bed before opting to remain up in recliner.  Discussed at length discharge plan.  He is firm at this time in wanting a rehab bed or CIR.  He lives alone and feels that is the best choice.  Did discuss option of home at wheelchair level and what that looks like with home therapies. Pt is having friend install rails at home on stairs and encouraged him to consider a ramp to allow for easier acces in/out of home.  Stair training was deferred as he is firm in wanting rehab still but he is encouraged to consider what home looks like as he is becoming more frustrated with lack of rehab options.  Will continue to address and discuss options with pt so he know he has choices.   If plan is discharge home, recommend the following: A little help with walking and/or transfers;A little help with bathing/dressing/bathroom;Assistance with cooking/housework;Assist for transportation;Help with stairs or ramp for entrance   Can travel by private vehicle        Equipment Recommendations  Wheelchair (measurements PT);Wheelchair cushion (measurements  PT);BSC/3in1    Recommendations for Other Services       Precautions / Restrictions Precautions Precautions: Fall Precaution Comments: L BKA Other Brace: LLE shrinker Restrictions Weight Bearing Restrictions Per Provider Order: Yes LLE Weight Bearing Per Provider Order: Non weight bearing     Mobility  Bed Mobility Overal bed mobility: Modified Independent               Patient Response: Cooperative  Transfers Overall transfer level: Modified independent Equipment used: Rolling walker (2 wheels) Transfers: Sit to/from Stand Sit to Stand: Supervision   Step pivot transfers: Contact guard assist Squat pivot transfers: Supervision          Ambulation/Gait Ambulation/Gait assistance: Supervision, Min assist Gait Distance (Feet): 120 Feet Assistive device: Rolling walker (2 wheels) Gait Pattern/deviations: Step-to pattern       General Gait Details: does fatigued with gait today despite standing rest breaks with poor transition to chair once reached.  min a x 1 to guide him to chair due to fatigue   Stairs         General stair comments: declined stair training.  wants Sales executive mobility: Yes Wheelchair propulsion: Both upper extremities, Right lower extremity Wheelchair parts: Supervision/cueing Distance: 160 Wheelchair Assistance Details (indicate cue type and reason): orientation to wheelchair   Tilt Bed Tilt Bed Patient Response: Cooperative  Modified Rankin (Stroke Patients Only)       Balance Overall balance assessment: Needs  assistance Sitting-balance support: Feet supported Sitting balance-Leahy Scale: Normal     Standing balance support: During functional activity, Reliant on assistive device for balance, Single extremity supported Standing balance-Leahy Scale: Good Standing balance comment: one LOB while transitioning to chair                            Cognition  Arousal: Alert Behavior During Therapy: WFL for tasks assessed/performed Overall Cognitive Status: Within Functional Limits for tasks assessed                                 General Comments: voices frustrations over still being in hospital and his perception of nothing being done to help get him out of here.        Exercises      General Comments        Pertinent Vitals/Pain Pain Assessment Pain Assessment: Faces Faces Pain Scale: Hurts a little bit Pain Location: residual limb Pain Descriptors / Indicators: Sore Pain Intervention(s): Limited activity within patient's tolerance, Monitored during session, Repositioned    Home Living                          Prior Function            PT Goals (current goals can now be found in the care plan section) Progress towards PT goals: Progressing toward goals    Frequency    7X/week      PT Plan      Co-evaluation              AM-PAC PT "6 Clicks" Mobility   Outcome Measure  Help needed turning from your back to your side while in a flat bed without using bedrails?: None Help needed moving from lying on your back to sitting on the side of a flat bed without using bedrails?: None Help needed moving to and from a bed to a chair (including a wheelchair)?: A Little Help needed standing up from a chair using your arms (e.g., wheelchair or bedside chair)?: A Little Help needed to walk in hospital room?: A Little Help needed climbing 3-5 steps with a railing? : A Little 6 Click Score: 20    End of Session Equipment Utilized During Treatment: Gait belt Activity Tolerance: Patient tolerated treatment well Patient left: in chair;with call bell/phone within reach Nurse Communication: Mobility status PT Visit Diagnosis: Unsteadiness on feet (R26.81);Difficulty in walking, not elsewhere classified (R26.2);Other abnormalities of gait and mobility (R26.89)     Time: 0950-1020 PT Time  Calculation (min) (ACUTE ONLY): 30 min  Charges:    $Gait Training: 8-22 mins $Therapeutic Activity: 8-22 mins PT General Charges $$ ACUTE PT VISIT: 1 Visit                    Danielle Dess, PTA 05/25/23, 11:18 AM

## 2023-05-25 NOTE — Progress Notes (Signed)
PROGRESS NOTE    Andrew Harding  WJX:[id number] DOB: 1950/10/16 DOA: 05/10/2023 PCP: Lynnea Ferrier, MD    Brief Narrative:   Andrew Harding is a 73 y.o. male with medical history significant of insulin-dependent DM 2, A-fib on Eliquis, HTN, CAD status post CABG, CKD 3B, PAD s/p transmetatarsal amputation left foot presenting w/ sepsis, suspected LLE cellulitis vs. Osteomyelitis. Pt reports worsening pain over L foot for multiple weeks.  Noted to have been admitted 03/2023 for issues including MSSA bacteremia, diabetic foot infection.Has had outpt follow up w/ ID and podiatry since discharge.    Noted recent evaluation w/ ID  cultures showing enterobacter, klebsiella and MSSA in the recent culture taken from the lateral wound- transitioned from cefadroxil to cipro. No reported medical noncompliance. Had follow up with podiatry 05/03/2023 w/ plan for MRI and vascular referral.    On presentation patient was febrile at 100.5, heart rate 100s, WBC 10.8, sodium 127, creatinine 1.42, glucose 242, lactic acid 2.3>>1.1.  DG left foot with soft tissue swelling.  Chest x-ray normal.  Procalcitonin 0.48. Vascular surgery and podiatry was consulted. Started on broad-spectrum antibiotics. MRI of the left foot was concerning for osteomyelitis.  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   Completed course of IV cefepime on 05/20/2023. Continue analgesics as needed for pain. He is okay for discharge from vascular surgeon's standpoint. Medically ready for discharge.  Currently pending CIR evaluation.  In my clinical opinion this patient would benefit from placement at [facility].  He is motivated and would benefit from a monitor rehab setting and 3 hours of inpatient intensive rehabilitation daily.       Transient hypotension Resolved     Hypokalemia Improved     Atrial fibrillation (HCC) Rate controlled. Continue Eliquis and metoprolol     Type 2 diabetes mellitus with hyperglycemia Overall, glucose levels are optimal.   Continue basal bolus regimen Carb modified diet     Comorbidities include CAD s/p CABG, hypertension, mixed hyperlipidemia, CKD stage IIIa, anemia of chronic disease       He is medically stable for discharge.  Awaiting insurance authorization for discharge to SNF.    DVT prophylaxis: Eliquis Code Status: Full Family Communication: None Disposition Plan: Status is: Inpatient Remains inpatient appropriate because: Unsafe discharge plan.  Medically appropriate for discharge to skilled nursing facility.   Level of care: Med-Surg  Consultants:  None  Procedures:  Left BKA  Antimicrobials: None   Subjective: Seen and examined.  Sitting up in chair.  No distress  Objective: Vitals:   05/24/23 0450 05/24/23 0848 05/24/23 2124 05/25/23 0432  BP: 110/68 122/77 109/76 122/64  Pulse: 88 78 88 74  Resp: 20 18 16 16   Temp: 98.3 F (36.8 C) 98.1 F (36.7 C) 99 F (37.2 C) 97.8 F (36.6 C)  TempSrc: Oral Oral Oral Oral  SpO2: 99% 100% 98% 97%  Weight:      Height:        Intake/Output Summary (Last 24 hours) at 05/25/2023 1357 Last data filed at 05/24/2023 1545 Gross per 24 hour  Intake 240 ml  Output --  Net 240 ml   Filed Weights   05/10/23 0931 05/16/23  1103  Weight: 101 kg 101 kg    Examination:  General exam: No acute distress Respiratory system: Lungs clear.  Work of breathing.  Room air Cardiovascular system: S1-S2, RRR, no murmurs, no pedal edema Gastrointestinal system: Soft, NT/ND, normal bowel sounds Central nervous system: Alert and oriented. No focal neurological deficits. Extremities: Status post left BKA Skin: No rashes, lesions or ulcers Psychiatry: Judgement and insight appear normal. Mood & affect appropriate.     Data Reviewed: I have personally reviewed following labs and imaging studies  CBC: No results for input(s): "WBC", "NEUTROABS", "HGB", "HCT", "MCV", "PLT" in the last 168 hours.  Basic Metabolic Panel: No results for input(s): "NA", "K", "CL", "CO2", "GLUCOSE", "BUN", "CREATININE", "CALCIUM", "MG", "PHOS" in the last 168 hours.  GFR: Estimated Creatinine Clearance: 74.9 mL/min (by C-G formula based on SCr of 1.05 mg/dL). Liver Function Tests: No results for input(s): "AST", "ALT", "ALKPHOS", "BILITOT", "PROT", "ALBUMIN" in the last 168 hours. No results for input(s): "LIPASE", "AMYLASE" in the last 168 hours. No results for input(s): "AMMONIA" in the last 168 hours. Coagulation Profile: No results for input(s): "INR", "PROTIME" in the last 168 hours. Cardiac Enzymes: No results for input(s): "CKTOTAL", "CKMB", "CKMBINDEX", "TROPONINI" in the last 168 hours. BNP (last 3 results) No results for input(s): "PROBNP" in the last 8760 hours. HbA1C: No results for input(s): "HGBA1C" in the last 72 hours. CBG: Recent Labs  Lab 05/24/23 1627 05/24/23 1717 05/24/23 2125 05/25/23 0828 05/25/23 1153  GLUCAP 111* 164* 152* 166* 172*   Lipid Profile: No results for input(s): "CHOL", "HDL", "LDLCALC", "TRIG", "CHOLHDL", "LDLDIRECT" in the last 72 hours. Thyroid Function Tests: No results for input(s): "TSH", "T4TOTAL", "FREET4", "T3FREE", "THYROIDAB" in the last 72 hours. Anemia Panel: No results for  input(s): "VITAMINB12", "FOLATE", "FERRITIN", "TIBC", "IRON", "RETICCTPCT" in the last 72 hours. Sepsis Labs: No results for input(s): "PROCALCITON", "LATICACIDVEN" in the last 168 hours.  No results found for this or any previous visit (from the past 240 hours).       Radiology Studies: No results found.      Scheduled Meds:  apixaban  5 mg Oral BID   insulin aspart  0-15 Units Subcutaneous TID WC   insulin aspart  0-5 Units Subcutaneous QHS   insulin glargine-yfgn  22 Units Subcutaneous Daily   metoprolol tartrate  25 mg Oral BID   Continuous Infusions:   LOS: 15 days   Tresa Moore, MD [facility]   If 7PM-7AM, please contact night-coverage  05/25/2023, 1:57 PM

## 2023-05-26 DIAGNOSIS — M86172 Other acute osteomyelitis, left ankle and foot: Secondary | ICD-10-CM | POA: Diagnosis not present

## 2023-05-26 LAB — GLUCOSE, CAPILLARY
Glucose-Capillary: 141 mg/dL — ABNORMAL HIGH (ref 70–99)
Glucose-Capillary: 158 mg/dL — ABNORMAL HIGH (ref 70–99)
Glucose-Capillary: 138 mg/dL — ABNORMAL HIGH (ref 70–99)
Glucose-Capillary: 118 mg/dL — ABNORMAL HIGH (ref 70–99)

## 2023-05-26 NOTE — Progress Notes (Signed)
PROGRESS NOTE    Andrew Harding  WUJ:[id number] DOB: 10-20-1950 DOA: 05/10/2023 PCP: Lynnea Ferrier, MD    Brief Narrative:   Andrew Harding is a 73 y.o. male with medical history significant of insulin-dependent DM 2, A-fib on Eliquis, HTN, CAD status post CABG, CKD 3B, PAD s/p transmetatarsal amputation left foot presenting w/ sepsis, suspected LLE cellulitis vs. Osteomyelitis. Pt reports worsening pain over L foot for multiple weeks.  Noted to have been admitted 03/2023 for issues including MSSA bacteremia, diabetic foot infection.Has had outpt follow up w/ ID and podiatry since discharge.    Noted recent evaluation w/ ID  cultures showing enterobacter, klebsiella and MSSA in the recent culture taken from the lateral wound- transitioned from cefadroxil to cipro. No reported medical noncompliance. Had follow up with podiatry 05/03/2023 w/ plan for MRI and vascular referral.    On presentation patient was febrile at 100.5, heart rate 100s, WBC 10.8, sodium 127, creatinine 1.42, glucose 242, lactic acid 2.3>>1.1.  DG left foot with soft tissue swelling.  Chest x-ray normal.  Procalcitonin 0.48. Vascular surgery and podiatry was consulted. Started on broad-spectrum antibiotics. MRI of the left foot was concerning for osteomyelitis.  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   Completed course of IV cefepime on 05/20/2023. Continue analgesics as needed for pain. He is okay for discharge from vascular surgeon's standpoint. Medically ready for discharge.  Currently pending CIR evaluation.   In my clinical opinion this patient would benefit from placement at [facility].   He is motivated and would benefit from a monitor rehab setting and 3 hours of inpatient intensive rehabilitation daily. Noted CRR referral.  Will follow-up Monday 1/27       Transient hypotension Resolved     Hypokalemia Improved     Atrial fibrillation (HCC) Rate controlled. Continue Eliquis and metoprolol     Type 2 diabetes mellitus with hyperglycemia Overall, glucose levels are optimal.   Continue basal bolus regimen Carb modified diet     Comorbidities include CAD s/p CABG, hypertension, mixed hyperlipidemia, CKD stage IIIa, anemia of chronic disease       He is medically stable for discharge.  Awaiting insurance authorization for discharge to SNF.    DVT prophylaxis: Eliquis Code Status: Full Family Communication: Spouse at bedside 1/25 Disposition Plan: Status is: Inpatient Remains inpatient appropriate because: Unsafe discharge plan.  Medically appropriate for discharge to skilled nursing facility versus acute inpatient rehab.   Level of care: Med-Surg  Consultants:  None  Procedures:  Left BKA  Antimicrobials: None   Subjective: Seen and examined.  No visible distress  Objective: Vitals:   05/25/23 1722 05/25/23 2040 05/26/23 0333 05/26/23 0826  BP: 103/62 120/70 115/73 117/79  Pulse: 76 80 69 91  Resp: 18 20 18 17   Temp: 98.5 F (36.9  C) 98.3 F (36.8 C) 98.5 F (36.9 C) 97.6 F (36.4 C)  TempSrc: Oral Oral Oral Oral  SpO2:  96% 98% 99%  Weight:      Height:        Intake/Output Summary (Last 24 hours) at 05/26/2023 1241 Last data filed at 05/26/2023 0631 Gross per 24 hour   Intake --  Output 200 ml  Net -200 ml   Filed Weights   05/10/23 0931 05/16/23 1103  Weight: 101 kg 101 kg    Examination:  General exam: NAD Respiratory system: Lungs clear.  Normal work of breathing.  Room air Cardiovascular system: S1-S2, RRR, no murmurs, no pedal edema Gastrointestinal system: Soft, NT/ND, normal bowel sounds Central nervous system: Alert and oriented. No focal neurological deficits. Extremities: Status post left BKA Skin: No rashes, lesions or ulcers Psychiatry: Judgement and insight appear normal. Mood & affect appropriate.     Data Reviewed: I have personally reviewed following labs and imaging studies  CBC: No results for input(s): "WBC", "NEUTROABS", "HGB", "HCT", "MCV", "PLT" in the last 168 hours.  Basic Metabolic Panel: No results for input(s): "NA", "K", "CL", "CO2", "GLUCOSE", "BUN", "CREATININE", "CALCIUM", "MG", "PHOS" in the last 168 hours.  GFR: Estimated Creatinine Clearance: 74.9 mL/min (by C-G formula based on SCr of 1.05 mg/dL). Liver Function Tests: No results for input(s): "AST", "ALT", "ALKPHOS", "BILITOT", "PROT", "ALBUMIN" in the last 168 hours. No results for input(s): "LIPASE", "AMYLASE" in the last 168 hours. No results for input(s): "AMMONIA" in the last 168 hours. Coagulation Profile: No results for input(s): "INR", "PROTIME" in the last 168 hours. Cardiac Enzymes: No results for input(s): "CKTOTAL", "CKMB", "CKMBINDEX", "TROPONINI" in the last 168 hours. BNP (last 3 results) No results for input(s): "PROBNP" in the last 8760 hours. HbA1C: No results for input(s): "HGBA1C" in the last 72 hours. CBG: Recent Labs  Lab 05/25/23 1724 05/25/23 1751 05/25/23 2119 05/26/23 0810 05/26/23 1143  GLUCAP 131* 115* 187* 141* 158*   Lipid Profile: No results for input(s): "CHOL", "HDL", "LDLCALC", "TRIG", "CHOLHDL", "LDLDIRECT" in the last 72 hours. Thyroid Function Tests: No results for input(s): "TSH", "T4TOTAL",  "FREET4", "T3FREE", "THYROIDAB" in the last 72 hours. Anemia Panel: No results for input(s): "VITAMINB12", "FOLATE", "FERRITIN", "TIBC", "IRON", "RETICCTPCT" in the last 72 hours. Sepsis Labs: No results for input(s): "PROCALCITON", "LATICACIDVEN" in the last 168 hours.  No results found for this or any previous visit (from the past 240 hours).       Radiology Studies: No results found.      Scheduled Meds:  apixaban  5 mg Oral BID   insulin aspart  0-15 Units Subcutaneous TID WC   insulin aspart  0-5 Units Subcutaneous QHS   insulin glargine-yfgn  22 Units Subcutaneous Daily   metoprolol tartrate  25 mg Oral BID   Continuous Infusions:   LOS: 16 days   Tresa Moore, MD [facility]   If 7PM-7AM, please contact night-coverage  05/26/2023, 12:41 PM

## 2023-05-26 NOTE — Progress Notes (Signed)
Physical Therapy Treatment Patient Details Name: Andrew Harding MRN: [id number] DOB: 1951/01/06 Today's Date: 05/26/2023   History of Present Illness Andrew Harding is a 73 y.o. male with medical history significant of insulin-dependent DM 2, A-fib on Eliquis, HTN, CAD status post CABG, CKD 3B, PAD s/p transmetatarsal amputation left foot presenting w/ sepsis, suspected LLE cellulitis vs. Osteomyelitis. 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    PT Comments  Pt was sitting in recliner with "spouse" at bedside. Per pt, pt and spouse do not live in same house. Pt is agreeable to session and endorses eagerness to trial stair performance. Pt was able to ambulate 2 x 50 ft with RW + supervision. He self propelled manual w/c  in hallway and into elevator to go to rehab gym for stair training. Pt performed ascending descending stairs 2 x with CGA. Vcs for slowing down for safety. Overall pt tolerated session well. Acute PT will continue to follow per current POC progressing as able per pt tolerance.    If plan is discharge home, recommend the following: A little help with walking and/or transfers;A little help with bathing/dressing/bathroom;Assistance with cooking/housework;Assist for transportation;Help with stairs or ramp for entrance     Equipment Recommendations  Rolling walker (2 wheels);BSC/3in1;Wheelchair (measurements PT);Wheelchair cushion (measurements PT)       Precautions / Restrictions Precautions Precautions: Fall Precaution Comments: L BKA Other Brace: LLE shrinker Restrictions Weight Bearing Restrictions Per Provider Order: Yes LLE Weight Bearing Per Provider Order: Non weight bearing     Mobility  Bed Mobility  General bed mobility comments: In recliner pre/post session    Transfers Overall transfer level: Modified independent Equipment used: Rolling walker (2 wheels) Transfers: Sit to/from Stand Sit to Stand: Supervision  General transfer comment: no physical assistance or vcs     Ambulation/Gait Ambulation/Gait assistance: Supervision Gait Distance (Feet): 50 Feet Assistive device: Rolling walker (2 wheels) Gait Pattern/deviations: Step-to pattern  General Gait Details: pt ambulated 2 x ~ 50 ft throughout session with RW. Author limited distance not fatigue/pain. pt self propelled w/c to rehab gym for stair performance.   Stairs Stairs: Yes Stairs assistance: Contact guard assist, Supervision Stair Management: Two rails, Step to pattern, Forwards Number of Stairs: 4 General stair comments: pt performed ascending/descending 4 stair 2 x to simulate home entry. vcs only for slowing down. CGA for Child psychotherapist mobility: Yes Wheelchair propulsion: Both upper extremities, Right lower extremity Wheelchair parts: Independent Distance: 120 Wheelchair Assistance Details (indicate cue type and reason): needed assistance for swinging leg rest away but was able to do all other w/c mobility/navigation independently      Balance Overall balance assessment: Needs assistance Sitting-balance support: Feet supported Sitting balance-Leahy Scale: Normal     Standing balance support: During functional activity, Reliant on assistive device for balance, Single extremity supported Standing balance-Leahy Scale: Good Standing balance comment: no LOB noted during session this afternoon with use of RW      Cognition Arousal: Alert Behavior During Therapy: WFL for tasks assessed/performed Overall Cognitive Status: Within Functional Limits for tasks assessed          General Comments General comments (skin integrity, edema, etc.): discussed at length rehab versus home with HHPT. pt voiced wanting rehab still even when encouraged to consider home with HHPT. spouse in room during session however spouse does not live in same house as patient.      Pertinent Vitals/Pain Pain Assessment Pain Assessment: 0-10 Pain Score: 2  Faces  Pain Scale: Hurts a little bit Pain Location: residual limb Pain Descriptors / Indicators: Sore Pain Intervention(s): Limited activity within patient's tolerance, Monitored during session, Premedicated before session, Repositioned     PT Goals (current goals can now be found in the care plan section) Acute Rehab PT Goals Patient Stated Goal: " get so I wont have to rely on my wife when I go home." Progress towards PT goals: Progressing toward goals    Frequency    7X/week       AM-PAC PT "6 Clicks" Mobility   Outcome Measure  Help needed turning from your back to your side while in a flat bed without using bedrails?: None Help needed moving from lying on your back to sitting on the side of a flat bed without using bedrails?: None Help needed moving to and from a bed to a chair (including a wheelchair)?: A Little Help needed standing up from a chair using your arms (e.g., wheelchair or bedside chair)?: A Little Help needed to walk in hospital room?: A Little Help needed climbing 3-5 steps with a railing? : A Little 6 Click Score: 20    End of Session   Activity Tolerance: Patient tolerated treatment well Patient left: in chair;with call bell/phone within reach Nurse Communication: Mobility status PT Visit Diagnosis: Unsteadiness on feet (R26.81);Difficulty in walking, not elsewhere classified (R26.2);Other abnormalities of gait and mobility (R26.89)     Time: 1050-1108 PT Time Calculation (min) (ACUTE ONLY): 18 min  Charges:    $Gait Training: 8-22 mins PT General Charges $$ ACUTE PT VISIT: 1 Visit                     Jetta Lout PTA 05/26/23, 3:14 PM

## 2023-05-27 DIAGNOSIS — M86172 Other acute osteomyelitis, left ankle and foot: Secondary | ICD-10-CM | POA: Diagnosis not present

## 2023-05-27 LAB — CBC
HCT: 32.7 % — ABNORMAL LOW (ref 39.0–52.0)
Hemoglobin: 10.8 g/dL — ABNORMAL LOW (ref 13.0–17.0)
MCH: 28.5 pg (ref 26.0–34.0)
MCHC: 33 g/dL (ref 30.0–36.0)
MCV: 86.3 fL (ref 80.0–100.0)
Platelets: 284 10*3/uL (ref 150–400)
RBC: 3.79 MIL/uL — ABNORMAL LOW (ref 4.22–5.81)
RDW: 14.2 % (ref 11.5–15.5)
WBC: 5.8 10*3/uL (ref 4.0–10.5)
nRBC: 0 % (ref 0.0–0.2)

## 2023-05-27 LAB — GLUCOSE, CAPILLARY
Glucose-Capillary: 257 mg/dL — ABNORMAL HIGH (ref 70–99)
Glucose-Capillary: 201 mg/dL — ABNORMAL HIGH (ref 70–99)
Glucose-Capillary: 151 mg/dL — ABNORMAL HIGH (ref 70–99)
Glucose-Capillary: 73 mg/dL (ref 70–99)

## 2023-05-27 NOTE — Progress Notes (Signed)
PROGRESS NOTE    Andrew Harding  GNF:[id number] DOB: 1950/10/04 DOA: 05/10/2023 PCP: Lynnea Ferrier, MD    Brief Narrative:   Andrew Harding is a 73 y.o. male with medical history significant of insulin-dependent DM 2, A-fib on Eliquis, HTN, CAD status post CABG, CKD 3B, PAD s/p transmetatarsal amputation left foot presenting w/ sepsis, suspected LLE cellulitis vs. Osteomyelitis. Pt reports worsening pain over L foot for multiple weeks.  Noted to have been admitted 03/2023 for issues including MSSA bacteremia, diabetic foot infection.Has had outpt follow up w/ ID and podiatry since discharge.    Noted recent evaluation w/ ID  cultures showing enterobacter, klebsiella and MSSA in the recent culture taken from the lateral wound- transitioned from cefadroxil to cipro. No reported medical noncompliance. Had follow up with podiatry 05/03/2023 w/ plan for MRI and vascular referral.    On presentation patient was febrile at 100.5, heart rate 100s, WBC 10.8, sodium 127, creatinine 1.42, glucose 242, lactic acid 2.3>>1.1.  DG left foot with soft tissue swelling.  Chest x-ray normal.  Procalcitonin 0.48. Vascular surgery and podiatry was consulted. Started on broad-spectrum antibiotics. MRI of the left foot was concerning for osteomyelitis.  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   Completed course of IV cefepime on 05/20/2023. Continue analgesics as needed for pain. He is okay for discharge from vascular surgeon's standpoint. Medically ready for discharge.  Currently pending CIR evaluation.   In my clinical opinion this patient would benefit from placement at [facility].   He is motivated and would benefit from a monitor rehab setting and 3 hours of inpatient intensive rehabilitation daily. Noted CIR referral.  Will follow-up Monday 1/27       Transient hypotension Resolved     Hypokalemia Improved     Atrial fibrillation (HCC) Rate controlled. Continue Eliquis and metoprolol     Type 2 diabetes mellitus with hyperglycemia Overall, glucose levels are optimal.   Continue basal bolus regimen Carb modified diet     Comorbidities include CAD s/p CABG, hypertension, mixed hyperlipidemia, CKD stage IIIa, anemia of chronic disease       He is medically stable for discharge.  Awaiting insurance authorization for discharge to SNF.    DVT prophylaxis: Eliquis Code Status: Full Family Communication: Spouse at bedside 1/25, 1/26 Disposition Plan: Status is: Inpatient Remains inpatient appropriate because: Unsafe discharge plan.  Medically appropriate for discharge to skilled nursing facility versus acute inpatient rehab.   Level of care: Med-Surg  Consultants:  None  Procedures:  Left BKA  Antimicrobials: None   Subjective: Seen and examined.  No visible distress.  Dressing changed at bedside.  Wound appears intact.  Objective: Vitals:   05/26/23 1500 05/26/23 2013 05/27/23 0951 05/27/23 0952  BP: 122/84 110/70 132/70 132/70  Pulse: 78 78 90  Resp: 17 16 16    Temp: 97.8 F (36.6 C) 98.3 F (36.8 C) 98 F (36.7 C)   TempSrc: Oral Oral Oral   SpO2: 100% 100% 98%   Weight:      Height:        Intake/Output Summary (Last 24 hours) at 05/27/2023 1456 Last data filed at  05/27/2023 1030 Gross per 24 hour  Intake 720 ml  Output --  Net 720 ml   Filed Weights   05/10/23 0931 05/16/23 1103  Weight: 101 kg 101 kg    Examination:  General exam: No acute distress Respiratory system: Lungs clear.  Normal work of breathing.  Room air Cardiovascular system: S1-S2, RRR, no murmurs, no pedal edema Gastrointestinal system: Soft, NT/ND, normal bowel sounds Central nervous system: Alert and oriented. No focal neurological deficits. Extremities: Status post left BKA.  Incision CDI Skin: No rashes, lesions or ulcers Psychiatry: Judgement and insight appear normal. Mood & affect appropriate.     Data Reviewed: I have personally reviewed following labs and imaging studies  CBC: Recent Labs  Lab 05/27/23 0510  WBC 5.8  HGB 10.8*  HCT 32.7*  MCV 86.3  PLT 284    Basic Metabolic Panel: No results for input(s): "NA", "K", "CL", "CO2", "GLUCOSE", "BUN", "CREATININE", "CALCIUM", "MG", "PHOS" in the last 168 hours.  GFR: Estimated Creatinine Clearance: 74.9 mL/min (by C-G formula based on SCr of 1.05 mg/dL). Liver Function Tests: No results for input(s): "AST", "ALT", "ALKPHOS", "BILITOT", "PROT", "ALBUMIN" in the last 168 hours. No results for input(s): "LIPASE", "AMYLASE" in the last 168 hours. No results for input(s): "AMMONIA" in the last 168 hours. Coagulation Profile: No results for input(s): "INR", "PROTIME" in the last 168 hours. Cardiac Enzymes: No results for input(s): "CKTOTAL", "CKMB", "CKMBINDEX", "TROPONINI" in the last 168 hours. BNP (last 3 results) No results for input(s): "PROBNP" in the last 8760 hours. HbA1C: No results for input(s): "HGBA1C" in the last 72 hours. CBG: Recent Labs  Lab 05/26/23 1600 05/26/23 2206 05/27/23 0954 05/27/23 1126 05/27/23 1205  GLUCAP 138* 118* 257* 201* 151*   Lipid Profile: No results for input(s): "CHOL", "HDL", "LDLCALC", "TRIG", "CHOLHDL", "LDLDIRECT" in the last 72 hours. Thyroid Function  Tests: No results for input(s): "TSH", "T4TOTAL", "FREET4", "T3FREE", "THYROIDAB" in the last 72 hours. Anemia Panel: No results for input(s): "VITAMINB12", "FOLATE", "FERRITIN", "TIBC", "IRON", "RETICCTPCT" in the last 72 hours. Sepsis Labs: No results for input(s): "PROCALCITON", "LATICACIDVEN" in the last 168 hours.  No results found for this or any previous visit (from the past 240 hours).       Radiology Studies: No results found.      Scheduled Meds:  apixaban  5 mg Oral BID   insulin aspart  0-15 Units Subcutaneous TID WC   insulin aspart  0-5 Units Subcutaneous QHS   insulin glargine-yfgn  22 Units Subcutaneous Daily   metoprolol tartrate  25 mg Oral BID   Continuous Infusions:   LOS: 17 days   Tresa Moore, MD [facility]   If 7PM-7AM, please contact night-coverage  05/27/2023, 2:56 PM

## 2023-05-27 NOTE — Progress Notes (Signed)
Physical Therapy Treatment Patient Details Name: Andrew Harding MRN: [id number] DOB: July 20, 1950 Today's Date: 05/27/2023   History of Present Illness Andrew Harding is a 73 y.o. male with medical history significant of insulin-dependent DM 2, A-fib on Eliquis, HTN, CAD status post CABG, CKD 3B, PAD s/p transmetatarsal amputation left foot presenting w/ sepsis, suspected LLE cellulitis vs. Osteomyelitis. 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    PT Comments  Pt received in bed, discussed at length discharge option, current LOF, and home set up if denied for CIR. Pt lives alone with stairs to enter. Good effort put forth this date with gait training in hall, heavy reliance on RW for support and frequent standing rest breaks due to fatigue. No LOB and good R foot clearance during mobility. Pt instructed on L BKA strengthening exercises and desensitization techniques. HEP and information given regarding proper positioning and care of new BKA. Wheelchair left in room to practice with nursing staff supervision. Will continue per POC. Awaiting CIR f/u on Monday   If plan is discharge home, recommend the following: A little help with walking and/or transfers;A little help with bathing/dressing/bathroom;Assistance with cooking/housework;Assist for transportation;Help with stairs or ramp for entrance   Can travel by private vehicle     Yes  Equipment Recommendations  Rolling walker (2 wheels);BSC/3in1;Wheelchair (measurements PT);Wheelchair cushion (measurements PT)    Recommendations for Other Services       Precautions / Restrictions Precautions Precautions: Fall Precaution Comments: L BKA Required Braces or Orthoses: Other Brace Other Brace: LLE shrinker Restrictions Weight Bearing Restrictions Per Provider Order: Yes LLE Weight Bearing Per Provider Order: Non weight bearing     Mobility  Bed Mobility Overal bed mobility: Modified Independent Bed Mobility: Rolling, Sidelying to Sit, Supine to Sit Rolling:  Modified independent (Device/Increase time) Sidelying to sit: Modified independent (Device/Increase time) Supine to sit: Modified independent (Device/Increase time) Sit to supine: Supervision        Transfers Overall transfer level: Modified independent Equipment used: Rolling walker (2 wheels) Transfers: Sit to/from Stand Sit to Stand: Supervision           General transfer comment: no physical assistance or vcs    Ambulation/Gait Ambulation/Gait assistance: Supervision Gait Distance (Feet): 100 Feet Assistive device: Rolling walker (2 wheels) Gait Pattern/deviations: Step-to pattern       General Gait Details: Frequent standing rest breaks due to fatigue   Stairs             Wheelchair Mobility     Tilt Bed    Modified Rankin (Stroke Patients Only)       Balance Overall balance assessment: Needs assistance Sitting-balance support: Feet supported Sitting balance-Leahy Scale: Normal Sitting balance - Comments: able to don shoe independently while sitting at edge of bed   Standing balance support: During functional activity, Reliant on assistive device for balance, Single extremity supported Standing balance-Leahy Scale: Good Standing balance comment: no LOB noted during session this afternoon with use of RW                            Cognition Arousal: Alert Behavior During Therapy: WFL for tasks assessed/performed Overall Cognitive Status: Within Functional Limits for tasks assessed                                          Exercises Amputee Exercises  Hip Extension: AROM, 10 reps, Prone, Left Knee Flexion: AROM, Left, 10 reps Straight Leg Raises: AROM, 10 reps, Left Other Exercises Other Exercises: Pt given 5lb hand weight for upper body strengthening    General Comments General comments (skin integrity, edema, etc.): Pt educated at length regarding post BKA care and desensitization techniques. Handouts given as  well with exercise program and proper positioning      Pertinent Vitals/Pain Pain Assessment Pain Assessment: No/denies pain    Home Living                          Prior Function            PT Goals (current goals can now be found in the care plan section) Acute Rehab PT Goals Patient Stated Goal: " get so I wont have to rely on my wife when I go home." Progress towards PT goals: Progressing toward goals    Frequency    7X/week      PT Plan      Co-evaluation              AM-PAC PT "6 Clicks" Mobility   Outcome Measure  Help needed turning from your back to your side while in a flat bed without using bedrails?: None Help needed moving from lying on your back to sitting on the side of a flat bed without using bedrails?: None Help needed moving to and from a bed to a chair (including a wheelchair)?: A Little Help needed standing up from a chair using your arms (e.g., wheelchair or bedside chair)?: A Little Help needed to walk in hospital room?: A Little Help needed climbing 3-5 steps with a railing? : A Little 6 Click Score: 20    End of Session Equipment Utilized During Treatment: Gait belt Activity Tolerance: Patient tolerated treatment well Patient left: in bed;with call bell/phone within reach;with family/visitor present Nurse Communication: Mobility status PT Visit Diagnosis: Unsteadiness on feet (R26.81);Difficulty in walking, not elsewhere classified (R26.2);Other abnormalities of gait and mobility (R26.89)     Time: 1478-2956 PT Time Calculation (min) (ACUTE ONLY): 41 min  Charges:    $Gait Training: 8-22 mins $Therapeutic Exercise: 8-22 mins $Therapeutic Activity: 8-22 mins PT General Charges $$ ACUTE PT VISIT: 1 Visit                    Zadie Cleverly, PTA  Jannet Askew 05/27/2023, 2:49 PM

## 2023-05-27 NOTE — TOC Progression Note (Signed)
Transition of Care [facility]) - Progression Note    Patient Details  Name: Andrew Harding MRN: [id number] Date of Birth: 21-Nov-1950  Transition of [facility]) CM/SW Contact  Rodney Langton, RN Phone Number: 05/27/2023, 12:39 PM  Clinical Narrative:     Spoke with patient regarding current bed offers, does not want to choose from available offers, would still like to follow up with CIR tomorrow.  This RNCM inquired about plan if CIR is not an option, admits that he does not have support in the home, but will decide to go home if he can't go to CIR.  He will not choose from current offers.   Expected Discharge Plan: Skilled Nursing Facility Barriers to Discharge: Continued Medical Work up  Expected Discharge Plan and Services     Post Acute Care Choice: Skilled Nursing Facility Living arrangements for the past 2 months: Single Family Home                                       Social Determinants of Health (SDOH) Interventions SDOH Screenings   Food Insecurity: No Food Insecurity (05/12/2023)  Housing: Low Risk  (05/12/2023)  Transportation Needs: No Transportation Needs (05/12/2023)  Utilities: Not At Risk (05/12/2023)  Depression (PHQ2-9): Low Risk  (05/03/2023)  Financial Resource Strain: Low Risk  (12/13/2022)   Received from [facility]  Social Connections: Moderately Isolated (05/12/2023)  Tobacco Use: Medium Risk (05/16/2023)    Readmission Risk Interventions    02/28/2023   12:53 PM  Readmission Risk Prevention Plan  Transportation Screening Complete  PCP or Specialist Appt within 3-5 Days Complete  HRI or Home Care Consult Complete  Social Work Consult for Recovery Care Planning/Counseling Complete  Palliative Care Screening Not Applicable  Medication Review Oceanographer) Complete

## 2023-05-28 DIAGNOSIS — M86172 Other acute osteomyelitis, left ankle and foot: Secondary | ICD-10-CM | POA: Diagnosis not present

## 2023-05-28 LAB — GLUCOSE, CAPILLARY
Glucose-Capillary: 220 mg/dL — ABNORMAL HIGH (ref 70–99)
Glucose-Capillary: 165 mg/dL — ABNORMAL HIGH (ref 70–99)
Glucose-Capillary: 153 mg/dL — ABNORMAL HIGH (ref 70–99)
Glucose-Capillary: 209 mg/dL — ABNORMAL HIGH (ref 70–99)
Glucose-Capillary: 169 mg/dL — ABNORMAL HIGH (ref 70–99)

## 2023-05-28 NOTE — TOC Progression Note (Signed)
Transition of Care [facility]) - Progression Note    Patient Details  Name: Andrew Harding MRN: [id number] Date of Birth: 1950/06/09  Transition of Care [facility]) CM/SW Contact  Chapman Fitch, RN Phone Number: 05/28/2023, 3:28 PM  Clinical Narrative:     Met with patient at bedside  Call placed to [facility] in [facility] x3 to see if they accept patient's insurance  Patient would like to accept bed at [facility].  Accepted in HUB.  Notified [name] with [facility], and they will start auth today  Expected Discharge Plan: Skilled Nursing Facility Barriers to Discharge: Continued Medical Work up  Expected Discharge Plan and Services     Post Acute Care Choice: Skilled Nursing Facility Living arrangements for the past 2 months: Single Family Home                                       Social Determinants of Health (SDOH) Interventions SDOH Screenings   Food Insecurity: No Food Insecurity (05/12/2023)  Housing: Low Risk  (05/12/2023)  Transportation Needs: No Transportation Needs (05/12/2023)  Utilities: Not At Risk (05/12/2023)  Depression (PHQ2-9): Low Risk  (05/03/2023)  Financial Resource Strain: Low Risk  (12/13/2022)   [facility]  Social Connections: Moderately Isolated (05/12/2023)  Tobacco Use: Medium Risk (05/16/2023)    Readmission Risk Interventions    02/28/2023   12:53 PM  Readmission Risk Prevention Plan  Transportation Screening Complete  PCP or Specialist Appt within 3-5 Days Complete  HRI or Home Care Consult Complete  Social Work Consult for Recovery Care Planning/Counseling Complete  Palliative Care Screening Not Applicable  Medication Review Oceanographer) Complete

## 2023-05-28 NOTE — Progress Notes (Addendum)
Inpatient Rehab Admissions Coordinator:   Spoke with pt over the phone.  He is familiar with CIR program from previous admission and hopeful to return.  We discussed his specific concerns regarding return home from the acute setting to include confidence with car transfers, household w/c mobility, and shower transfers.  I've discussed with rehab MD, Dr. Riley Kill who feels pt likely could benefit from another day or two in the acute setting with therapy focus on direct d/c home.  Also reached out to therapy team today to discuss.  Pending his therapy session, will determine whether we can pursue insurance auth.    1607: Pt continues to progress with mobility, currently supervision 125' with a RW, mod I with w/c.  Note he is agreeable to STR at SNF.  He is doing too well for CIR stay at this time. Discussed with Dr. Riley Kill and he is in agreement. We will sign off.    Estill Dooms, PT, DPT Admissions Coordinator [phone] 05/28/23  12:49 PM

## 2023-05-28 NOTE — Progress Notes (Signed)
Physical Therapy Treatment Patient Details Name: Andrew Harding MRN: [id number] DOB: 1951/02/21 Today's Date: 05/28/2023   History of Present Illness Andrew Harding is a 73 y.o. male with medical history significant of insulin-dependent DM 2, A-fib on Eliquis, HTN, CAD status post CABG, CKD 3B, PAD s/p transmetatarsal amputation left foot presenting w/ sepsis, suspected LLE cellulitis vs. Osteomyelitis. 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    PT Comments  Pt seen this pm for PT and at length discussion regarding pt's current functional deficits and concerns of returning home alone as a new L BKA. Pt is not at a functional independent level despite progressing very well during prolonged hospital stay. Pt educated on benefits of STR to address more precise PT/OT interventions and training to facilitate a safer transition home alone. Pt educated on stump care, propper ace wrap application to promote healing and shape of distal L LE. Pt continues to independently complete B LE HEP and desensitization techniques. Continue PT per POC.    If plan is discharge home, recommend the following: A little help with walking and/or transfers;A little help with bathing/dressing/bathroom;Assistance with cooking/housework;Assist for transportation;Help with stairs or ramp for entrance   Can travel by private vehicle     Yes  Equipment Recommendations  Rolling walker (2 wheels);BSC/3in1;Wheelchair (measurements PT);Wheelchair cushion (measurements PT)    Recommendations for Other Services       Precautions / Restrictions Precautions Precautions: Fall Precaution Comments: L BKA Required Braces or Orthoses: Other Brace Other Brace: LLE shrinker Restrictions Weight Bearing Restrictions Per Provider Order: Yes LLE Weight Bearing Per Provider Order: Non weight bearing     Mobility  Bed Mobility Overal bed mobility: Modified Independent Bed Mobility: Rolling, Sidelying to Sit, Supine to Sit Rolling: Modified independent  (Device/Increase time) Sidelying to sit: Modified independent (Device/Increase time) Supine to sit: Modified independent (Device/Increase time) Sit to supine: Supervision        Transfers Overall transfer level: Modified independent Equipment used: Rolling walker (2 wheels) Transfers: Sit to/from Stand Sit to Stand: Supervision           General transfer comment: no physical assistance or vcs    Ambulation/Gait Ambulation/Gait assistance: Supervision Gait Distance (Feet): 125 Feet Assistive device: Rolling walker (2 wheels) Gait Pattern/deviations: Step-to pattern Gait velocity: decr     General Gait Details: Frequent standing rest breaks due to fatigue   Stairs             Wheelchair Mobility     Tilt Bed    Modified Rankin (Stroke Patients Only)       Balance Overall balance assessment: Needs assistance Sitting-balance support: Feet supported Sitting balance-Leahy Scale: Normal Sitting balance - Comments: able to don shoe independently while sitting at edge of bed   Standing balance support: During functional activity, Reliant on assistive device for balance, Single extremity supported Standing balance-Leahy Scale: Fair Standing balance comment: no LOB noted during session this afternoon with use of RW                            Cognition Arousal: Alert Behavior During Therapy: WFL for tasks assessed/performed Overall Cognitive Status: Within Functional Limits for tasks assessed                                 General Comments: voices frustrations over still being in hospital and his perception of nothing being  done to help get him out of here.        Exercises Amputee Exercises Knee Flexion: AROM, Left, 10 reps Straight Leg Raises: AROM, 10 reps, Left Other Exercises Other Exercises: Pt educated on ace wrapping technique for L BKA through demonstration/application    General Comments General comments (skin  integrity, edema, etc.): Long discussion regarding role of PT in prep for returning home as well as other disciplines and benefits of Rehab prior to returning home alone      Pertinent Vitals/Pain Pain Assessment Pain Assessment: No/denies pain    Home Living                          Prior Function            PT Goals (current goals can now be found in the care plan section) Acute Rehab PT Goals Patient Stated Goal: " get so I wont have to rely on my wife when I go home." Progress towards PT goals: Progressing toward goals    Frequency    7X/week      PT Plan      Co-evaluation              AM-PAC PT "6 Clicks" Mobility   Outcome Measure  Help needed turning from your back to your side while in a flat bed without using bedrails?: None Help needed moving from lying on your back to sitting on the side of a flat bed without using bedrails?: None Help needed moving to and from a bed to a chair (including a wheelchair)?: A Little Help needed standing up from a chair using your arms (e.g., wheelchair or bedside chair)?: A Little Help needed to walk in hospital room?: A Little Help needed climbing 3-5 steps with a railing? : A Little 6 Click Score: 20    End of Session Equipment Utilized During Treatment: Gait belt Activity Tolerance: Patient tolerated treatment well Patient left: in bed;with call bell/phone within reach;with family/visitor present Nurse Communication: Mobility status PT Visit Diagnosis: Unsteadiness on feet (R26.81);Difficulty in walking, not elsewhere classified (R26.2);Other abnormalities of gait and mobility (R26.89)     Time: 1340-1426 PT Time Calculation (min) (ACUTE ONLY): 46 min  Charges:    $Gait Training: 8-22 mins $Therapeutic Exercise: 8-22 mins $Therapeutic Activity: 8-22 mins PT General Charges $$ ACUTE PT VISIT: 1 Visit                    Zadie Cleverly, PTA  Jannet Askew 05/28/2023, 3:57 PM

## 2023-05-28 NOTE — Progress Notes (Signed)
PROGRESS NOTE    Andrew Harding  ZOX:[id number] DOB: 21-Apr-1951 DOA: 05/10/2023 PCP: Lynnea Ferrier, MD    Brief Narrative:   HAMAD WHYTE is a 73 y.o. male with medical history significant of insulin-dependent DM 2, A-fib on Eliquis, HTN, CAD status post CABG, CKD 3B, PAD s/p transmetatarsal amputation left foot presenting w/ sepsis, suspected LLE cellulitis vs. Osteomyelitis. Pt reports worsening pain over L foot for multiple weeks.  Noted to have been admitted 03/2023 for issues including MSSA bacteremia, diabetic foot infection.Has had outpt follow up w/ ID and podiatry since discharge.    Noted recent evaluation w/ ID  cultures showing enterobacter, klebsiella and MSSA in the recent culture taken from the lateral wound- transitioned from cefadroxil to cipro. No reported medical noncompliance. Had follow up with podiatry 05/03/2023 w/ plan for MRI and vascular referral.    On presentation patient was febrile at 100.5, heart rate 100s, WBC 10.8, sodium 127, creatinine 1.42, glucose 242, lactic acid 2.3>>1.1.  DG left foot with soft tissue swelling.  Chest x-ray normal.  Procalcitonin 0.48. Vascular surgery and podiatry was consulted. Started on broad-spectrum antibiotics. MRI of the left foot was concerning for osteomyelitis.  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   Completed course of IV cefepime on 05/20/2023. Continue analgesics as needed for pain. He is okay for discharge from vascular surgeon's standpoint. Medically ready for discharge.  Plan: It is my medical opinion the patient will benefit from disposition directly from the hospital into a monitored setting.  He has made strides however he does live alone and he underwent a successful BKA during this hospitalization.  I recommended disposition into a monitored facility such as acute inpatient rehab versus skilled nursing facility.     Transient hypotension Resolved     Hypokalemia Improved     Atrial fibrillation (HCC) Rate controlled. Continue Eliquis and metoprolol     Type 2 diabetes mellitus with hyperglycemia Overall, glucose levels are optimal.   Continue basal bolus regimen Carb modified diet     Comorbidities include CAD s/p CABG, hypertension, mixed hyperlipidemia, CKD stage IIIa, anemia of chronic disease       He is medically stable for discharge.  Awaiting insurance authorization for discharge to SNF.    DVT prophylaxis: Eliquis Code Status: Full Family Communication: Spouse at bedside 1/25, 1/26 Disposition Plan: Status is: Inpatient Remains inpatient appropriate because: Unsafe discharge plan.  Medically appropriate for discharge to skilled nursing facility versus acute inpatient rehab.   Level of care: Med-Surg  Consultants:  None  Procedures:  Left BKA  Antimicrobials: None   Subjective: Seen and examined.  No visible distress.  No complaints pain this morning.  Objective: Vitals:   05/27/23 0952 05/27/23 1614 05/27/23 2013 05/28/23 0402  BP: 132/70 119/67 102/71 134/71  Pulse:  68 78 74  Resp:  18 18 18   Temp:  98.3 F (36.8 C) 98.1 F (36.7 C) 97.6 F (36.4 C)  TempSrc:   Oral Oral  SpO2:  99% 100% 100%  Weight:      Height:        Intake/Output Summary (Last  24 hours) at 05/28/2023 1314 Last data filed at 05/28/2023 0900 Gross per 24 hour  Intake 120 ml  Output --  Net 120 ml   Filed Weights   05/10/23 0931 05/16/23 1103  Weight: 101 kg 101 kg    Examination:  General exam: NAD Respiratory system: Lungs clear.  Normal work of breathing.  Room air Cardiovascular system: S1-S2, RRR, no murmurs, no pedal edema Gastrointestinal system: Soft, NT/ND, normal bowel sounds Central nervous system: Alert and oriented. No focal neurological deficits. Extremities: Status post left BKA.  Incision CDI Skin: No rashes, lesions or ulcers Psychiatry: Judgement and insight appear normal. Mood & affect appropriate.     Data Reviewed: I have personally reviewed following labs and imaging studies  CBC: Recent Labs  Lab 05/27/23 0510  WBC 5.8  HGB 10.8*  HCT 32.7*  MCV 86.3  PLT 284    Basic Metabolic Panel: No results for input(s): "NA", "K", "CL", "CO2", "GLUCOSE", "BUN", "CREATININE", "CALCIUM", "MG", "PHOS" in the last 168 hours.  GFR: Estimated Creatinine Clearance: 74.9 mL/min (by C-G formula based on SCr of 1.05 mg/dL). Liver Function Tests: No results for input(s): "AST", "ALT", "ALKPHOS", "BILITOT", "PROT", "ALBUMIN" in the last 168 hours. No results for input(s): "LIPASE", "AMYLASE" in the last 168 hours. No results for input(s): "AMMONIA" in the last 168 hours. Coagulation Profile: No results for input(s): "INR", "PROTIME" in the last 168 hours. Cardiac Enzymes: No results for input(s): "CKTOTAL", "CKMB", "CKMBINDEX", "TROPONINI" in the last 168 hours. BNP (last 3 results) No results for input(s): "PROBNP" in the last 8760 hours. HbA1C: No results for input(s): "HGBA1C" in the last 72 hours. CBG: Recent Labs  Lab 05/27/23 1205 05/27/23 1615 05/27/23 2121 05/28/23 0848 05/28/23 1211  GLUCAP 151* 73 220* 165* 153*   Lipid Profile: No results for input(s): "CHOL", "HDL", "LDLCALC", "TRIG", "CHOLHDL", "LDLDIRECT" in the  last 72 hours. Thyroid Function Tests: No results for input(s): "TSH", "T4TOTAL", "FREET4", "T3FREE", "THYROIDAB" in the last 72 hours. Anemia Panel: No results for input(s): "VITAMINB12", "FOLATE", "FERRITIN", "TIBC", "IRON", "RETICCTPCT" in the last 72 hours. Sepsis Labs: No results for input(s): "PROCALCITON", "LATICACIDVEN" in the last 168 hours.  No results found for this or any previous visit (from the past 240 hours).       Radiology Studies: No results found.      Scheduled Meds:  apixaban  5 mg Oral BID   insulin aspart  0-15 Units Subcutaneous TID WC   insulin aspart  0-5 Units Subcutaneous QHS   insulin glargine-yfgn  22 Units Subcutaneous Daily   metoprolol tartrate  25 mg Oral BID   Continuous Infusions:   LOS: 18 days   Tresa Moore, MD [facility]   If 7PM-7AM, please contact night-coverage  05/28/2023, 1:14 PM

## 2023-05-28 NOTE — Plan of Care (Signed)

## 2023-05-29 DIAGNOSIS — M86172 Other acute osteomyelitis, left ankle and foot: Secondary | ICD-10-CM | POA: Diagnosis not present

## 2023-05-29 LAB — GLUCOSE, CAPILLARY
Glucose-Capillary: 149 mg/dL — ABNORMAL HIGH (ref 70–99)
Glucose-Capillary: 167 mg/dL — ABNORMAL HIGH (ref 70–99)

## 2023-05-29 NOTE — Discharge Instructions (Signed)
[facility] [address] [place] [address]

## 2023-05-29 NOTE — Discharge Summary (Signed)
Physician Discharge Summary  Andrew Harding ZOX:[id number] DOB: 1951-04-17 DOA: 05/10/2023  PCP: Lynnea Ferrier, MD  Admit date: 05/10/2023 Discharge date: 05/29/2023  Admitted From: Home Disposition:  SNF  Recommendations for Outpatient Follow-up:  Follow up with PCP in 1-2 weeks Follow up with vascular surgery  Home Health:No  Equipment/Devices:None   Discharge Condition:Stable  CODE STATUS:FULL  Diet recommendation: Carb mod  Brief/Interim Summary:  Andrew Harding is a 73 y.o. male with medical history significant of insulin-dependent DM 2, A-fib on Eliquis, HTN, CAD status post CABG, CKD 3B, PAD s/p transmetatarsal amputation left foot presenting w/ sepsis, suspected LLE cellulitis vs. Osteomyelitis. Pt reports worsening pain over L foot for multiple weeks.  Noted to have been admitted 03/2023 for issues including MSSA bacteremia, diabetic foot infection.Has had outpt follow up w/ ID and podiatry since discharge.    Noted recent evaluation w/ ID  cultures showing enterobacter, klebsiella and MSSA in the recent culture taken from the lateral wound- transitioned from cefadroxil to cipro. No reported medical noncompliance. Had follow up with podiatry 05/03/2023 w/ plan for MRI and vascular referral.    On presentation patient was febrile at 100.5, heart rate 100s, WBC 10.8, sodium 127, creatinine 1.42, glucose 242, lactic acid 2.3>>1.1.  DG left foot with soft tissue swelling.  Chest x-ray normal.  Procalcitonin 0.48. Vascular surgery and podiatry was consulted. Started on broad-spectrum antibiotics. MRI of the left foot was concerning for osteomyelitis.    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   Completed course of IV cefepime on 05/20/2023. Continue analgesics as needed for pain. He is okay for discharge from vascular surgeon's standpoint. Medically ready for discharge.  Plan: Discharge to SNF FU OP PCP and vascular surgery   Transient hypotension Resolved     Hypokalemia Improved     Atrial fibrillation (HCC) Rate controlled. Continue Eliquis and metoprolol     Type 2 diabetes mellitus with hyperglycemia Overall, glucose levels are optimal.   Continue basal bolus regimen Carb modified diet     Comorbidities include CAD s/p CABG, hypertension, mixed hyperlipidemia, CKD stage IIIa, anemia of chronic disease       He is medically stable for discharge.  Awaiting insurance authorization for discharge to SNF.   Discharge Instructions  Discharge Instructions     Diet - low sodium heart healthy   Complete by: As directed    Increase activity slowly   Complete by: As directed    No wound care   Complete by: As directed       Allergies as of 05/29/2023   No Known Allergies      Medication List     PAUSE taking these medications    clopidogrel 75 MG tablet Wait to take this until your doctor or other care provider tells you to start again. Commonly  known as: PLAVIX TAKE 1 TABLET BY MOUTH EVERY DAY WITH BREAKFAST       STOP taking these medications    ciprofloxacin 500 MG tablet Commonly known as: CIPRO       TAKE these medications    acetaminophen 325 MG tablet Commonly known as: TYLENOL Take 1-2 tablets (325-650 mg total) by mouth every  4 (four) hours as needed for mild pain.   apixaban 5 MG Tabs tablet Commonly known as: ELIQUIS Take 1 tablet (5 mg total) by mouth 2 (two) times daily.   docusate sodium 100 MG capsule Commonly known as: COLACE Take 1 capsule (100 mg total) by mouth 2 (two) times daily.   Fifty50 Pen Needles 32G X 4 MM Misc Generic drug: Insulin Pen Needle USE 4 TIMES DAILY   FreeStyle Libre 2 Sensor Misc   gabapentin 300 MG capsule Commonly known as: NEURONTIN Take 1 capsule (300 mg total) by mouth 3 (three) times daily.   Lantus SoloStar 100 UNIT/ML Solostar Pen Generic drug: insulin glargine Inject 36 Units into the skin at bedtime. What changed: how much to take   metFORMIN 500 MG 24 hr tablet Commonly known as: GLUCOPHAGE-XR Take 2,000 mg by mouth every evening. Take with dinner.   metoprolol tartrate 25 MG tablet Commonly known as: LOPRESSOR Take 1 tablet (25 mg total) by mouth 2 (two) times daily.   ondansetron 4 MG tablet Commonly known as: Zofran Take 1 tablet (4 mg total) by mouth every 6 (six) hours as needed for nausea or vomiting.   OneTouch Ultra test strip Generic drug: glucose blood Use 3 (three) times daily   oxyCODONE 5 MG immediate release tablet Commonly known as: Oxy IR/ROXICODONE Take 1 tablet (5 mg total) by mouth every 6 (six) hours as needed for moderate pain (pain score 4-6). SNF use only.  Refills per SNF providers   Ozempic (0.25 or 0.5 MG/DOSE) 2 MG/3ML Sopn Generic drug: Semaglutide(0.25 or 0.5MG /DOS) Inject 0.75 mLs into the skin once a week.   pravastatin 80 MG tablet Commonly known as: PRAVACHOL TAKE ONE TABLET BY MOUTH AT NIGHT   traZODone 50 MG tablet Commonly known as: DESYREL TAKE ONE TABLET AT BEDTIME AS NEEDED        Follow-up Information     Annice Needy, MD Follow up in 1 month(s).   Specialties: Vascular Surgery, Radiology, Interventional Cardiology Why: Ultrasound bilateral lower extremities with ABI's Contact  information: [address] [place] [address] [phone]                No Known Allergies  Consultations: Vascular sx   Procedures/Studies: [place] OR NERVE BLOCK-IMAGE ONLY [facility]) Result Date: 05/16/2023 There is no interpretation for this exam.  This order is for images obtained during a surgical procedure.  Please See "Surgeries" Tab for more information regarding the procedure.   PERIPHERAL VASCULAR CATHETERIZATION Result Date: 05/11/2023 See surgical note for result.  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 Sepsis. Suspected left lower extremity cellulitis versus osteomyelitis. Worsening pain within left foot for multiple weeks. EXAM: MRI OF THE LEFT FOOT WITHOUT CONTRAST TECHNIQUE: Multiplanar, multisequence MR imaging of the left foot was performed. No intravenous contrast was administered. COMPARISON:  left foot radiographs 05/10/2023 and 02/26/2023; MRI left forefoot 08/18/2022 FINDINGS: Bones/Joint/Cartilage Postsurgical changes are again seen of amputation of the first through fourth metatarsal to the level of the  mid metatarsal shafts and the first metatarsal to the level of the proximal metaphysis. There is high-grade marrow edema throughout the remaining first metatarsal. There is moderate to high-grade marrow edema within the proximal greater than distal aspect of the postsurgical stump of the second metatarsal and moderate marrow edema within the proximal shaft of the postsurgical third metatarsal stump. Given the surrounding forefoot soft tissue edema, findings are suspicious for osteomyelitis of the remaining shaft of the second and third metatarsals. There is high-grade marrow edema throughout the remaining base of the fifth metatarsal. There is moderate cortical erosion at the proximal plantar lateral aspect of the base of the fifth metatarsal,  indicating acute osteomyelitis of the remaining fifth metatarsal. There is mild patchy marrow edema within the cuboid without definite cortical erosion, nonspecific and possibly stress related/reactive. Ligaments The Lisfranc ligament complex is intact. Muscles and Tendons There is diffuse edema throughout the intrinsic plantar greater than dorsal foot musculature. Soft tissues There is moderate subcutaneous fat soft tissue swelling and edema. There is a concave defect within the soft tissues of the distal medial aspect of the., as seen on yesterday's radiographs (series 5, image 29 and series 7, image 9). This is adjacent to the distal dorsomedial aspect of the first metatarsal amputation stump. There is moderate edema and skin thickening in this region, and there appears to be a decreased T1 increased T2 signal sinus tract extending from the skin surface to the distal aspect of the first metatarsal stump (series 7, image 12). There is a soft tissue ulcer measuring up to 1.6 cm along the longitudinal dimension of the foot (series 5, image 35) and 1.8 cm along the short axis of the foot (coronal series 7, image 31) and contacting the lateral base of the fifth metatarsal. IMPRESSION: 1. Postsurgical changes of amputation of the first through fourth metatarsals to the level of the mid metatarsal shafts and the first metatarsal to the level of the proximal metaphysis. 2. There is a concavity within the distal medial forefoot amputation soft tissues with apparent sinus tract extending to the distal aspect of the remaining first metatarsal. Cortical erosion and high-grade marrow edema within the remaining first metatarsal indicating acute osteomyelitis. 3. Soft tissue ulcer at the plantar lateral midfoot adjacent to the base of the fifth metatarsal with cortical erosion and high-grade marrow edema throughout the remaining base of the fifth metatarsal, indicating acute osteomyelitis of the remaining postsurgical fifth  metatarsal. 4. There is moderate marrow edema within the proximal greater than distal aspect of the postsurgical stump of the second metatarsal and moderate marrow edema within the proximal shaft of the postsurgical third metatarsal stump. Given the surrounding forefoot soft tissue edema, findings are suspicious for osteomyelitis of the remaining shaft of the second and third metatarsals. However, no definitive sinus tract is seen contacting the second or third metatarsal stumps. Electronically Signed   By: Neita Garnet M.D.   On: 05/11/2023 10:34   DG Foot Complete Left Result Date: 05/10/2023 CLINICAL DATA:  Wound in the lateral left foot. Concern for osteomyelitis or free air. EXAM: LEFT FOOT - COMPLETE 3+ VIEW COMPARISON:  Radiograph dated 02/26/2023. FINDINGS: Status post transmetatarsal amputation of the foot. There is no acute fracture or dislocation. The bones are osteopenic. There is degenerative changes of the tarsometatarsal joints. No erosive changes or periosteal elevation to suggest acute osteomyelitis. Soft tissue thickening of the stump of the forefoot with irregularity of the skin over the medial stump. No soft tissue gas.  Vascular calcifications. IMPRESSION: 1. Status post transmetatarsal amputation of the foot. No radiographic evidence of acute osteomyelitis. 2. Soft tissue thickening of the stump of the forefoot with irregularity of the skin over the medial stump. No soft tissue gas. Electronically Signed   By: Elgie Collard M.D.   On: 05/10/2023 11:07   DG Chest Port 1 View Result Date: 05/10/2023 CLINICAL DATA:  [id number] Sepsis [facility]) [id number] EXAM: PORTABLE CHEST 1 VIEW COMPARISON:  CXR 02/26/23 FINDINGS: Assessment is slightly limited due to patient positioning. No pleural effusion. No pneumothorax. Likely unchanged cardiac and mediastinal contours. Status post median sternotomy and CABG. No focal airspace opacity. No radiographically apparent displaced rib fractures. Visualized upper  abdomen is unremarkable. IMPRESSION: No focal airspace opacity Electronically Signed   By: Lorenza Cambridge M.D.   On: 05/10/2023 10:04      Subjective: Seen and examined.  Appropriate for dispo to SNF.  Discharge Exam: Vitals:   05/29/23 0516 05/29/23 0936  BP: 123/69 106/79  Pulse: 84 93  Resp: 16 18  Temp: 98.4 F (36.9 C) 98.1 F (36.7 C)  SpO2: 96% 100%   Vitals:   05/28/23 0402 05/28/23 2136 05/29/23 0516 05/29/23 0936  BP: 134/71 114/77 123/69 106/79  Pulse: 74 89 84 93  Resp: 18 20 16 18   Temp: 97.6 F (36.4 C) 98.4 F (36.9 C) 98.4 F (36.9 C) 98.1 F (36.7 C)  TempSrc: Oral Oral Oral Oral  SpO2: 100% 98% 96% 100%  Weight:      Height:        General: Pt is alert, awake, not in acute distress Cardiovascular: RRR, S1/S2 +, no rubs, no gallops Respiratory: CTA bilaterally, no wheezing, no rhonchi Abdominal: Soft, NT, ND, bowel sounds + Extremities: Left BKA    The results of significant diagnostics from this hospitalization (including imaging, microbiology, ancillary and laboratory) are listed below for reference.     Microbiology: No results found for this or any previous visit (from the past 240 hours).   Labs: BNP (last 3 results) Recent Labs    08/17/22 0944  BNP 137.3*   Basic Metabolic Panel: No results for input(s): "NA", "K", "CL", "CO2", "GLUCOSE", "BUN", "CREATININE", "CALCIUM", "MG", "PHOS" in the last 168 hours. Liver Function Tests: No results for input(s): "AST", "ALT", "ALKPHOS", "BILITOT", "PROT", "ALBUMIN" in the last 168 hours. No results for input(s): "LIPASE", "AMYLASE" in the last 168 hours. No results for input(s): "AMMONIA" in the last 168 hours. CBC: Recent Labs  Lab 05/27/23 0510  WBC 5.8  HGB 10.8*  HCT 32.7*  MCV 86.3  PLT 284   Cardiac Enzymes: No results for input(s): "CKTOTAL", "CKMB", "CKMBINDEX", "TROPONINI" in the last 168 hours. BNP: Invalid input(s): "POCBNP" CBG: Recent Labs  Lab 05/28/23 0848  05/28/23 1211 05/28/23 1856 05/28/23 2137 05/29/23 0838  GLUCAP 165* 153* 209* 169* 149*   D-Dimer No results for input(s): "DDIMER" in the last 72 hours. Hgb A1c No results for input(s): "HGBA1C" in the last 72 hours. Lipid Profile No results for input(s): "CHOL", "HDL", "LDLCALC", "TRIG", "CHOLHDL", "LDLDIRECT" in the last 72 hours. Thyroid function studies No results for input(s): "TSH", "T4TOTAL", "T3FREE", "THYROIDAB" in the last 72 hours.  Invalid input(s): "FREET3" Anemia work up No results for input(s): "VITAMINB12", "FOLATE", "FERRITIN", "TIBC", "IRON", "RETICCTPCT" in the last 72 hours. Urinalysis    Component Value Date/Time   COLORURINE YELLOW (A) 05/10/2023 0937   APPEARANCEUR HAZY (A) 05/10/2023 0937   APPEARANCEUR Hazy (A) 05/30/2022 1026   LABSPEC  1.017 05/10/2023 0937   PHURINE 5.0 05/10/2023 0937   GLUCOSEU 50 (A) 05/10/2023 0937   HGBUR MODERATE (A) 05/10/2023 0937   BILIRUBINUR NEGATIVE 05/10/2023 0937   BILIRUBINUR Negative 05/30/2022 1026   KETONESUR NEGATIVE 05/10/2023 0937   PROTEINUR 100 (A) 05/10/2023 0937   UROBILINOGEN 0.2 04/05/2018 1042   NITRITE NEGATIVE 05/10/2023 0937   LEUKOCYTESUR NEGATIVE 05/10/2023 0937   Sepsis Labs Recent Labs  Lab 05/27/23 0510  WBC 5.8   Microbiology No results found for this or any previous visit (from the past 240 hours).   Time coordinating discharge: Over 30 minutes  SIGNED:   Tresa Moore, MD  [facility] 05/29/2023, 10:39 AM Pager   If 7PM-7AM, please contact night-coverage

## 2023-05-29 NOTE — Progress Notes (Signed)
Occupational Therapy Treatment Patient Details Name: Andrew Harding MRN: [id number] DOB: 13-Oct-1950 Today's Date: 05/29/2023   History of present illness Andrew Harding is a 73 y.o. male with medical history significant of insulin-dependent DM 2, A-fib on Eliquis, HTN, CAD status post CABG, CKD 3B, PAD s/p transmetatarsal amputation left foot presenting w/ sepsis, suspected LLE cellulitis vs. Osteomyelitis. 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   OT comments  Pt seen for OT tx today, spouse present start of session. Pt making good progress towards goals. Performing ADL session including oral care, shaving, washing face, UB/LB bathing/dressing from seated EOB with setup of items nearby, and LB ADLs from STS with supervision. Pt endorsing that pain overall in residual limb has improved. Motivated to continue progression at next LOC. OT will continue to follow for functional gains. Patient will benefit from continued inpatient follow up therapy, <3 hours/day.       If plan is discharge home, recommend the following:  A little help with walking and/or transfers;A little help with bathing/dressing/bathroom;Help with stairs or ramp for entrance   Equipment Recommendations  BSC/3in1    Recommendations for Other Services      Precautions / Restrictions Precautions Precautions: Fall Precaution Comments: L BKA Required Braces or Orthoses: Other Brace Other Brace: LLE shrinker Restrictions Weight Bearing Restrictions Per Provider Order: Yes LLE Weight Bearing Per Provider Order: Non weight bearing       Mobility Bed Mobility Overal bed mobility: Modified Independent                  Transfers Overall transfer level: Modified independent     Sit to Stand: Supervision                 Balance Overall balance assessment: Needs assistance Sitting-balance support: Feet supported Sitting balance-Leahy Scale: Normal                                     ADL either performed or  assessed with clinical judgement   ADL Overall ADL's : Needs assistance/impaired     Grooming: Wash/dry hands;Wash/dry face;Sitting;Oral care (+shaving) Grooming Details (indicate cue type and reason): set up assist for grooming tasks seated at EOB Upper Body Bathing: Set up;Sitting Upper Body Bathing Details (indicate cue type and reason): no physical assist, setup to perform sitting EOB Lower Body Bathing: Set up;Sit to/from stand   Upper Body Dressing : Set up;Sitting   Lower Body Dressing: Sit to/from stand;Set up Lower Body Dressing Details (indicate cue type and reason): dons pants and underwear using bed features, no physical assist                      Cognition Arousal: Alert Behavior During Therapy: WFL for tasks assessed/performed Overall Cognitive Status: Within Functional Limits for tasks assessed                                                General Comments Residual limb wrapped upon arrival    Pertinent Vitals/ Pain       Pain Assessment Pain Assessment: No/denies pain         Frequency  Min 1X/week        Progress Toward Goals  OT Goals(current goals can now be found in  the care plan section)  Progress towards OT goals: Progressing toward goals  Acute Rehab OT Goals OT Goal Formulation: With patient Time For Goal Achievement: 05/31/23 Potential to Achieve Goals: Good ADL Goals Pt Will Perform Grooming: Independently;standing Pt Will Perform Lower Body Dressing: Independently;sit to/from stand Pt Will Transfer to Toilet: with modified independence;ambulating;regular height toilet  Plan         AM-PAC OT "6 Clicks" Daily Activity     Outcome Measure   Help from another person eating meals?: None Help from another person taking care of personal grooming?: None Help from another person toileting, which includes using toliet, bedpan, or urinal?: A Little Help from another person bathing (including washing, rinsing,  drying)?: A Little Help from another person to put on and taking off regular upper body clothing?: None Help from another person to put on and taking off regular lower body clothing?: A Little 6 Click Score: 21    End of Session    OT Visit Diagnosis: Other abnormalities of gait and mobility (R26.89);Muscle weakness (generalized) (M62.81)   Activity Tolerance Patient tolerated treatment well   Patient Left in bed;with call bell/phone within reach;with bed alarm set   Nurse Communication Mobility status        Time: 0454-0981 OT Time Calculation (min): 28 min  Charges: OT General Charges $OT Visit: 1 Visit OT Treatments $Self Care/Home Management : 23-37 mins  Winner Valeriano L. Swan Zayed, OTR/L  05/29/23, 1:12 PM

## 2023-05-29 NOTE — TOC Transition Note (Signed)
Transition of Care [facility]) - Discharge Note   Patient Details  Name: Andrew Harding MRN: [id number] Date of Birth: 07-Dec-1950  [facility]) CM/SW Contact:  Chapman Fitch, RN Phone Number: 05/29/2023, 11:14 AM   Clinical Narrative:     Confirmed with [name] at [facility] that patient has auth to admit today  [facility] will [facility] to[facility] Anticipated DC date: 05/29/23  Family notified: Per MD he has updated patient and wife at bedside Transport by: family to transport  Per MD patient ready for DC to . RN, patient, patient's family, and facility notified of DC. Discharge Summary sent to facility. RN given number for report. DC packet on chart. Ambulance transport requested for patient.   Signed script in dc packet TOC signing off.     Barriers to Discharge: Continued Medical Work up   Patient Goals and CMS Choice            Discharge Placement                       Discharge Plan and Services Additional resources added to the After Visit Summary for       Post Acute Care Choice: Skilled Nursing Facility                               Social Drivers of Health (SDOH) Interventions SDOH Screenings   Food Insecurity: No Food Insecurity (05/12/2023)  Housing: Low Risk  (05/12/2023)  Transportation Needs: No Transportation Needs (05/12/2023)  Utilities: Not At Risk (05/12/2023)  Depression (PHQ2-9): Low Risk  (05/03/2023)  Financial Resource Strain: Low Risk  (12/13/2022)   Received from [facility]  Social Connections: Moderately Isolated (05/12/2023)  Tobacco Use: Medium Risk (05/16/2023)     Readmission Risk Interventions    02/28/2023   12:53 PM  Readmission Risk Prevention Plan  Transportation Screening Complete  PCP or Specialist Appt within 3-5 Days Complete  HRI or Home Care Consult Complete  Social Work Consult for Recovery Care Planning/Counseling Complete  Palliative Care Screening Not Applicable  Medication  Review Oceanographer) Complete

## 2023-05-29 NOTE — Plan of Care (Signed)
Report called to [facility] rehab, patient going to room 102.  Dressing changed prior to discharge and blood sugar checked and insulin given.  Signed Rx placed in patients chart to be taken with him to facility.  Patient transported by private vehicle.

## 2023-06-05 ENCOUNTER — Other Ambulatory Visit (INDEPENDENT_AMBULATORY_CARE_PROVIDER_SITE_OTHER): Payer: Self-pay | Admitting: Vascular Surgery

## 2023-06-05 ENCOUNTER — Telehealth (INDEPENDENT_AMBULATORY_CARE_PROVIDER_SITE_OTHER): Payer: Self-pay | Admitting: Vascular Surgery

## 2023-06-05 DIAGNOSIS — Z89512 Acquired absence of left leg below knee: Secondary | ICD-10-CM

## 2023-06-05 DIAGNOSIS — Z9889 Other specified postprocedural states: Secondary | ICD-10-CM

## 2023-06-05 NOTE — Telephone Encounter (Signed)
 [name] from [facility] called to make fu with Andrew Harding post surgery. Patient is a [facility] and Andrew Harding patient. Patient already had ABI & appointment scheduled with [facility] on 06/07/23. Per Andrew Harding ok to keep 06/07/23 appointments with [facility]. They can also take out staples at that time.  LVM for [name] @ [facility] at [phone] and spoke with patient wife RE: 06/07/23 ABI & [facility] appointment ok. They will make fu appointment as needed

## 2023-06-07 ENCOUNTER — Ambulatory Visit (INDEPENDENT_AMBULATORY_CARE_PROVIDER_SITE_OTHER): Payer: Managed Care, Other (non HMO) | Admitting: Vascular Surgery

## 2023-06-07 ENCOUNTER — Ambulatory Visit (INDEPENDENT_AMBULATORY_CARE_PROVIDER_SITE_OTHER): Payer: Medicare Other

## 2023-06-07 ENCOUNTER — Encounter (INDEPENDENT_AMBULATORY_CARE_PROVIDER_SITE_OTHER): Payer: Self-pay | Admitting: Vascular Surgery

## 2023-06-07 VITALS — BP 121/73 | HR 64 | Resp 16

## 2023-06-07 DIAGNOSIS — I739 Peripheral vascular disease, unspecified: Secondary | ICD-10-CM | POA: Diagnosis not present

## 2023-06-07 DIAGNOSIS — Z9889 Other specified postprocedural states: Secondary | ICD-10-CM | POA: Diagnosis not present

## 2023-06-07 DIAGNOSIS — Z89512 Acquired absence of left leg below knee: Secondary | ICD-10-CM | POA: Diagnosis not present

## 2023-06-07 NOTE — Progress Notes (Addendum)
 Patient ID: FREDRICK GEOGHEGAN, male   DOB: 1951/04/03, 73 y.o.   MRN: [id number]  Chief Complaint  Patient presents with   Routine Post Op    [facility] 3 week follow up    HPI PER BEAGLEY is a 73 y.o. male.    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   Sepsis (HCC) 08/17/2022   Tubular adenoma    Vitamin D  deficiency     Past Surgical History:  Procedure Laterality Date   AMPUTATION Left 08/19/2022   Procedure: TRANSMETATARSAL AMPUTATION LEFT FOOT WITH IRRIGATION AND DEBRIDEMENT;  Surgeon: Lennie Barter, DPM;  Location: [facility] ORS;  Service: Podiatry;  Laterality: Left;   AMPUTATION Left 05/16/2023   Procedure: AMPUTATION BELOW KNEE;  Surgeon: Marea Selinda RAMAN, MD;  Location: [facility];  Service: General;  Laterality: Left;   AMPUTATION TOE Right 06/01/2015   Procedure: AMPUTATION TOE;  Surgeon: Donnice Cory, DPM;  Location: [facility];  Service: Podiatry;  Laterality: Right;   AMPUTATION TOE Left 08/11/2022   Procedure: AMPUTATION TOE 2, 3, 4;  Surgeon: [name], DPM;  Location: [facility];  Service: Podiatry;  Laterality: Left;   CATARACT EXTRACTION, BILATERAL     CIRCUMCISION N/A 06/12/2022   Procedure: CIRCUMCISION ADULT;  Surgeon: Penne Knee, MD;  Location: [facility];  Service: Urology;  Laterality: N/A;   COLONOSCOPY WITH PROPOFOL  N/A 02/14/2016   Procedure: COLONOSCOPY WITH PROPOFOL ;  Surgeon: Gladis RAYMOND Mariner, MD;   Location: [facility] ENDOSCOPY;  Service: Endoscopy;  Laterality: N/A;   COLONOSCOPY WITH PROPOFOL  N/A 01/07/2019   Procedure: COLONOSCOPY WITH PROPOFOL ;  Surgeon: Mariner Gladis RAYMOND, MD;  Location: [facility] ENDOSCOPY;  Service: Endoscopy;  Laterality: N/A;   CORONARY ARTERY BYPASS GRAFT N/A 08/2012   EXCISION PARTIAL PHALANX Right 06/01/2015   Procedure: EXCISION PARTIAL PHALANX /  BONE;  Surgeon: Donnice Cory, DPM;  Location: [facility];  Service: Podiatry;  Laterality: Right;   FLEXIBLE SIGMOIDOSCOPY N/A 05/29/2016   Procedure: FLEXIBLE SIGMOIDOSCOPY;  Surgeon: Gladis RAYMOND Mariner, MD;  Location: [facility] ENDOSCOPY;  Service: Endoscopy;  Laterality: N/A;   INCISION AND DRAINAGE Left 08/23/2022   Procedure: INCISION AND DRAINAGE;  Surgeon: [name], DPM;  Location: [facility];  Service: Podiatry;  Laterality: Left;   INCISION AND DRAINAGE OF WOUND Left 09/15/2022   Procedure: 11044 - DEBRIDE BONE and EXCISION IF 1ST METATARSAL BONE WITH  DELAY PRIMARY CLOSURE;  Surgeon: [name], DPM;  Location: [facility];  Service: Podiatry;  Laterality: Left;   IRRIGATION AND DEBRIDEMENT FOOT Left 02/28/2023   Procedure: IRRIGATION AND DEBRIDEMENT FOOT;  Surgeon: [name], DPM;  Location: [facility];  Service: Orthopedics/Podiatry;  Laterality: Left;   KNEE ARTHROSCOPY Left    LOWER EXTREMITY ANGIOGRAPHY Left 08/22/2022   Procedure: Lower Extremity Angiography;  Surgeon: Jama Cordella MATSU, MD;  Location: [facility]IVE [facility];  Service: Cardiovascular;  Laterality: Left;   LOWER EXTREMITY ANGIOGRAPHY Left 05/11/2023   Procedure: Lower Extremity Angiography;  Surgeon: Marea Selinda RAMAN, MD;  Location: [facility];  Service: Cardiovascular;  Laterality: Left;   TEE WITHOUT CARDIOVERSION N/A 03/01/2023   Procedure: TRANSESOPHAGEAL ECHOCARDIOGRAM;  Surgeon: Alluri, Keller BROCKS, MD;  Location: [facility];  Service: Cardiovascular;  Laterality: N/A;   WOUND DEBRIDEMENT Left 09/15/2022   Procedure: 11043 - DEBRIDE SKIN.  MUSCLE FASCIA;  Surgeon: [name], DPM;  Location: [facility];  Service: Podiatry;  Laterality: Left;      No Known Allergies  Current Outpatient Medications  Medication Sig Dispense Refill   acetaminophen  (TYLENOL ) 325 MG tablet Take 1-2 tablets (325-650 mg total) by mouth every 4 (four) hours as needed for mild pain.     apixaban  (ELIQUIS ) 5 MG TABS tablet Take 1 tablet (5 mg total) by mouth 2 (two) times daily. 60 tablet 2   [Paused] clopidogrel  (PLAVIX ) 75 MG tablet TAKE 1 TABLET BY MOUTH EVERY DAY WITH BREAKFAST 30 tablet 5   Continuous Glucose Sensor (FREESTYLE LIBRE 2 SENSOR) MISC      docusate sodium  (COLACE) 100 MG capsule Take 1 capsule (100 mg total) by mouth 2 (two) times daily. 100 capsule 0   gabapentin  (NEURONTIN ) 300 MG capsule Take 1 capsule (300 mg total) by mouth 3 (three) times daily. 90 capsule 0   glucose blood (ONETOUCH ULTRA) test strip Use 3 (three) times daily     insulin  glargine (LANTUS ) 100 UNIT/ML Solostar Pen Inject 36 Units into the skin at bedtime. (Patient taking differently: Inject 34 Units into the skin at bedtime.) 15 mL 11   Insulin  Pen Needle (FIFTY50 PEN NEEDLES) 32G X 4 MM MISC USE 4 TIMES DAILY     metFORMIN  (GLUCOPHAGE -XR) 500 MG 24 hr tablet Take 2,000 mg by mouth every evening. Take with dinner.     metoprolol  tartrate (LOPRESSOR ) 25 MG tablet Take 1 tablet (25 mg total) by mouth 2 (two) times daily. 60 tablet 0   oxyCODONE  (OXY IR/ROXICODONE ) 5 MG immediate release tablet Take 1 tablet (5 mg total) by mouth every 6 (six) hours as needed for moderate pain (pain score 4-6). SNF use only.  Refills per SNF providers 12 tablet 0   OZEMPIC, 0.25 OR 0.5 MG/DOSE, 2 MG/3ML SOPN Inject 0.75 mLs into the skin once a week.     pravastatin  (PRAVACHOL ) 80 MG tablet TAKE ONE TABLET BY MOUTH AT NIGHT     traZODone  (DESYREL ) 50 MG tablet TAKE ONE TABLET AT BEDTIME AS NEEDED     ondansetron  (ZOFRAN ) 4 MG tablet Take 1 tablet (4 mg total) by mouth every 6 (six)  hours as needed for nausea or vomiting. (Patient not taking: Reported on 05/10/2023) 30 tablet 2   No current facility-administered medications for this visit.        Physical Exam BP 121/73   Pulse 64   Resp 16  Gen:  WD/WN, NAD Skin: incision C/D/I; staples removed; there is a moderate-sized dogear in the medial corner of the incision     Assessment/Plan:  1. S/P BKA (below knee amputation) unilateral, left (HCC) (Primary) Patient is doing well and we will move forward with fitting for his prosthesis.  I did mention to him that the dogear may be problematic with fitting and if this is true then we will just trim this area of skin as an outpatient.  Mr. Brodhead is a highly  motivated left below-knee amputee K3 level ambulator.  He is well-healed and ready for his prosthesis fitting.  I am ordering a carbon energy storing foot with hydraulic ankle.  This foot will assist him in his activities of daily living.  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  He enjoys gardening and maintaining his yard and home.  This foot will allow him to ambulate at different cadences and walk up and down inclines much easier and safer.       Cordella Shawl 06/07/2023, 9:06 PM   This note was created with Dragon medical transcription system.  Any errors from dictation are unintentional.

## 2023-06-08 LAB — VAS US ABI WITH/WO TBI

## 2023-06-09 ENCOUNTER — Encounter (INDEPENDENT_AMBULATORY_CARE_PROVIDER_SITE_OTHER): Payer: Self-pay | Admitting: Vascular Surgery

## 2023-06-09 DIAGNOSIS — Z89512 Acquired absence of left leg below knee: Secondary | ICD-10-CM

## 2023-06-09 HISTORY — DX: Acquired absence of left leg below knee: Z89.512

## 2023-06-27 ENCOUNTER — Other Ambulatory Visit: Payer: Self-pay | Admitting: Infectious Diseases

## 2023-08-01 ENCOUNTER — Telehealth (INDEPENDENT_AMBULATORY_CARE_PROVIDER_SITE_OTHER): Payer: Self-pay

## 2023-08-01 NOTE — Telephone Encounter (Signed)
 Patient left a message stating that he has stitch at the edge of left bka site. Patient notice stitch while at [facility] for fitting and recommended to contact the office. Please Advise

## 2023-08-01 NOTE — Telephone Encounter (Signed)
 Judy Pimple from [facility] sent a picture and I'm not certain it is a stitch, but he can come in and we can take a look.  It looks like it may be old gauze from the pic

## 2023-08-16 ENCOUNTER — Telehealth (INDEPENDENT_AMBULATORY_CARE_PROVIDER_SITE_OTHER): Payer: Self-pay

## 2023-08-16 NOTE — Telephone Encounter (Signed)
 Referral placed by Dr.Kline (PCP)

## 2023-08-16 NOTE — Telephone Encounter (Signed)
 Patient called to get a referral from [facility] to start PT in [place]. Pt has received his prothesis for his left BKA. [facility]

## 2023-08-17 ENCOUNTER — Ambulatory Visit: Attending: Internal Medicine

## 2023-08-17 DIAGNOSIS — Z89432 Acquired absence of left foot: Secondary | ICD-10-CM | POA: Insufficient documentation

## 2023-08-17 DIAGNOSIS — M86172 Other acute osteomyelitis, left ankle and foot: Secondary | ICD-10-CM | POA: Diagnosis present

## 2023-08-17 DIAGNOSIS — M6281 Muscle weakness (generalized): Secondary | ICD-10-CM | POA: Insufficient documentation

## 2023-08-17 DIAGNOSIS — R262 Difficulty in walking, not elsewhere classified: Secondary | ICD-10-CM | POA: Diagnosis present

## 2023-08-17 NOTE — Therapy (Signed)
 OUTPATIENT PHYSICAL THERAPY PROSTHETIC EVALUATION   Patient Name: Andrew Harding MRN: [id number] DOB:1950-05-21, 73 y.o., male Today's Date: 08/18/2023  END OF SESSION:  PT End of Session - 08/18/23 1048     Visit Number 1    Number of Visits 25    Date for PT Re-Evaluation 11/09/23    Authorization Type eval: 08/18/23    PT Start Time 0940    PT Stop Time 1025    PT Time Calculation (min) 45 min    Equipment Utilized During Treatment Gait belt    Activity Tolerance Patient tolerated treatment well    Behavior During Therapy WFL for tasks assessed/performed            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   Sepsis (HCC) 08/17/2022   Tubular adenoma    Vitamin D  deficiency    Past Surgical History:  Procedure Laterality Date   AMPUTATION Left 08/19/2022   Procedure: TRANSMETATARSAL AMPUTATION LEFT FOOT WITH IRRIGATION AND DEBRIDEMENT;  Surgeon: Lennie Barter, DPM;  Location: [facility];  Service: Podiatry;  Laterality: Left;   AMPUTATION Left 05/16/2023   Procedure: AMPUTATION BELOW KNEE;  Surgeon: Marea Selinda RAMAN, MD;  Location: [facility];  Service: General;  Laterality: Left;   AMPUTATION TOE Right 06/01/2015   Procedure: AMPUTATION TOE;  Surgeon: Donnice Cory, DPM;  Location: [facility];  Service: Podiatry;  Laterality: Right;   AMPUTATION TOE Left 08/11/2022   Procedure: AMPUTATION TOE 2, 3, 4;  Surgeon: [name], DPM;  Location: [facility];  Service: Podiatry;  Laterality: Left;   CATARACT EXTRACTION,  BILATERAL     CIRCUMCISION N/A 06/12/2022   Procedure: CIRCUMCISION ADULT;  Surgeon: Penne Knee, MD;  Location: [facility];  Service: Urology;  Laterality: N/A;   COLONOSCOPY WITH PROPOFOL  N/A 02/14/2016   Procedure: COLONOSCOPY WITH PROPOFOL ;  Surgeon: Gladis RAYMOND Mariner, MD;  Location: [facility] ENDOSCOPY;  Service: Endoscopy;  Laterality: N/A;   COLONOSCOPY WITH PROPOFOL  N/A 01/07/2019   Procedure: COLONOSCOPY WITH PROPOFOL ;  Surgeon: Mariner Gladis RAYMOND, MD;  Location: [facility] ENDOSCOPY;  Service: Endoscopy;  Laterality: N/A;   CORONARY ARTERY BYPASS GRAFT N/A 08/2012   EXCISION PARTIAL PHALANX Right 06/01/2015   Procedure: EXCISION PARTIAL PHALANX /  BONE;  Surgeon: Donnice Cory, DPM;  Location: [facility];  Service: Podiatry;  Laterality: Right;   FLEXIBLE SIGMOIDOSCOPY N/A 05/29/2016   Procedure: FLEXIBLE SIGMOIDOSCOPY;  Surgeon: Gladis RAYMOND Mariner, MD;  Location: [facility] ENDOSCOPY;  Service: Endoscopy;  Laterality: N/A;   INCISION AND DRAINAGE Left 08/23/2022   Procedure: INCISION AND DRAINAGE;  Surgeon: [name], DPM;  Location: [facility];  Service: Podiatry;  Laterality: Left;   INCISION AND DRAINAGE OF WOUND Left 09/15/2022   Procedure: 11044 - DEBRIDE BONE and EXCISION IF 1ST METATARSAL BONE WITH  DELAY PRIMARY CLOSURE;  Surgeon: [name], DPM;  Location: [facility];  Service: Podiatry;  Laterality: Left;   IRRIGATION AND DEBRIDEMENT  FOOT Left 02/28/2023   Procedure: IRRIGATION AND DEBRIDEMENT FOOT;  Surgeon: [name], DPM;  Location: [facility];  Service: Orthopedics/Podiatry;  Laterality: Left;   KNEE ARTHROSCOPY Left    LOWER EXTREMITY ANGIOGRAPHY Left 08/22/2022   Procedure: Lower Extremity Angiography;  Surgeon: Jama Cordella MATSU, MD;  Location: [facility]VASIVE CV LAB;  Service: Cardiovascular;  Laterality: Left;   LOWER EXTREMITY ANGIOGRAPHY Left 05/11/2023   Procedure: Lower Extremity Angiography;  Surgeon: Marea Selinda RAMAN, MD;  Location: [facility] CV LAB;  Service:  Cardiovascular;  Laterality: Left;   TEE WITHOUT CARDIOVERSION N/A 03/01/2023   Procedure: TRANSESOPHAGEAL ECHOCARDIOGRAM;  Surgeon: Alluri, Keller BROCKS, MD;  Location: [facility];  Service: Cardiovascular;  Laterality: N/A;   WOUND DEBRIDEMENT Left 09/15/2022   Procedure: 11043 - DEBRIDE SKIN. MUSCLE FASCIA;  Surgeon: [name], DPM;  Location: [facility];  Service: Podiatry;  Laterality: Left;   Patient Active Problem List   Diagnosis Date Noted   S/P BKA (below knee amputation) unilateral, 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   Gram-negative bacteremia 05/11/2023   Sepsis (HCC) 05/10/2023   Open wound of left foot with complication 05/10/2023   MSSA bacteremia 02/28/2023   Controlled type 2 diabetes mellitus with neuropathy (HCC) 02/27/2023   Postural dizziness with presyncope 02/26/2023   Diabetic peripheral neuropathy (HCC)    Wound drainage 09/02/2022   Diabetic foot infection with possible osteomyelitis, left  (HCC) 08/29/2022   S/P transmetatarsal amputation of foot, left (HCC) 08/29/2022   Acute osteomyelitis of left ankle or foot (HCC) 08/24/2022   Medication management 08/24/2022   Diabetic infection of left foot (HCC) 08/24/2022   Infection of left foot 08/19/2022   Atrial fibrillation (HCC) 08/19/2022   Cellulitis 08/17/2022   Hypotension due to hypovolemia 08/17/2022   Gout 04/05/2018   Vitamin D  deficiency, unspecified 04/05/2018   Diabetic ulcer of toe of left foot associated with type 2 diabetes mellitus, limited to breakdown of skin (HCC) 12/21/2017   Status post amputation of toe of right foot (HCC) 11/01/2015   CKD (chronic kidney disease) stage 3, GFR 30-59 ml/min (HCC) 10/08/2015   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   Osteomyelitis (HCC) 05/31/2015   Type 2 diabetes mellitus with stage 3b chronic kidney disease, with long-term current use of insulin  (HCC) 05/31/2015   History of osteomyelitis 05/31/2015   Benign essential hypertension 09/14/2014   Coronary artery disease  09/17/2012   Mixed hyperlipidemia 09/17/2012   CAD 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    PCP: Fernande Ophelia [name], MD  REFERRING PROVIDER: Fernande Ophelia [name], MD   REFERRING DIAG: [phone] (ICD-10-CM) - Acquired absence of left leg below knee   RATIONALE FOR EVALUATION AND TREATMENT: Rehabilitation  THERAPY DIAG: Difficulty in walking, not elsewhere classified  Muscle weakness (generalized)  ONSET DATE: 05/16/23  FOLLOW-UP APPT SCHEDULED WITH REFERRING PROVIDER: No    SUBJECTIVE:  SUBJECTIVE STATEMENT:  S/P L BKA 05/16/23  PERTINENT HISTORY:  GIANNY SABINO is a 73 y.o. male who presented to vascular with left foot osteomyelitis status post previous transmetatarsal amputation. Podiatry deemed his foot was no longer salvageable. He underwent a left below-the-knee amputation on 05/16/23. After discharge he spent 1 week at [facility] and was discharged with [facility] PT. He has been consistent with his HEP. Pt denies any post-operative complications or signs of infection. He had a follow-up with vascular who considered him a K3 level ambulator and ordered him a carbon energy storing foot with hydraulic ankle and pin fit socket. He was fitted for his L BKA prosthesis at [facility] earlier this week and was able to ambulate in the parallel bars at their office. He was  provided a size II shrinker, short, 20-21mmHg as well as single and multi ply socks. He does have a lateral dogear that was considered possibly problematic with fitting by vascular. It has reduced considerably in size over the last few weeks and if it remains a problem for fit vascular has advised that it can be trimmed as an outpatient. Pt has tried some short distance, limited driving at home.  Pain: Yes, intermittent, phantom L foot pain Numbness/Tingling: No Focal  Weakness: Yes, L quad  Prior history of physical therapy for balance/gait:  Yes Dominant hand: right Imaging: No, denies recent imaging; Red flags: Negative for chills/fever, night sweats, nausea, vomiting, unrelenting pain  PRECAUTIONS: Fall  WEIGHT BEARING RESTRICTIONS: No  FALLS: Has patient fallen in last 6 months? Yes. Number of falls 1 , One fall since being back home.  Living Environment Lives with: lives alone, ex-wife and friend help occasionally; Lives in: House/apartment, bonus room upstairs but bedroom and bathroom on main level. Tub shower with tub bench, no grab bars, detachable shower head, elevated height commode Stairs: 7 steps to enter, stairs modified (lowered height and deepened each step by platform on each step. Pt and his walker are able to fit flat on each step.  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 He would like to begin walking as soon as possible.    OBJECTIVE:   Patient Surveys  ABC: 78.8%  Cognition Patient is oriented to person, place, and time.  Recent memory is intact.  Remote memory is intact.  Attention span and concentration are intact.  Expressive speech is intact.  Patient's fund of knowledge is within normal limits for educational level.    Gross Musculoskeletal Assessment Tremor: None Bulk: Slight decrease in L quad definition Tone: Normal Slight eschar on lateral side of LLE incision. Small dogear noted.  Posture: No gross abnormalities noted in standing or seated posture  AROM Deferred  LE MMT: Deferred  Sensation Deferred  Reflexes Deferred  Bed mobility: Not  assessed  Transfers: Assistive device utilized: Walker - 2 wheeled  Sit to stand: Modified independence Stand to sit: Modified independence Chair to chair: Modified independence Floor:  Not assessed Pt able to don and doff LLE prosthesis without assistance from therapist. His first attempt resulted in pin too anterior so removed and repeated by patient.  Curb:  Curb Comments: Not assessed  Stairs: Level of  Assistance: CGA Number of Stairs: 4  Height of Stairs: 6  Comments: Pt able to ascend/descend stairs forward using L prosthesis with bilateral rails and step-to pattern. No overt LOB. Pt educated how to perform stair ascend/descend with rolling walker (backward with ascend and forward with descend). He is able to complete this twice safely with the rolling walker.  Gait: Distance walked: 500' Assistive device utilized: Environmental Consultant - 2 wheeled, single ply sock Level of assistance: CGA Comments: Pt able to ambulate into clinic with hop-to pattern using rolling walker without his prosthetic. Throughout the course of his evaluation he ambulated approximately 500' with LLE BKA prosthesis and rolling walker. Pt utilizes partial step-through pattern. Extensive ambulation performed with patient in the // bars, rehab gym, hallway, and out to his car in the parking lot at the end of the session. In the // bars he is able to take a few small steps without UE support but notable instability in LLE withotu UE support. Pt requires cue for LLE heel strike during gait with walker initially but quickly integrates into his ambulation. He also initially steps too close to the front of his walker so cued to increase distance of anterior movement of walker prior to stepping. No overt LOB requiring any external correction from therapist.   Functional Outcome Measures  Results Comments  BERG    DGI    FGA    TUG    5TSTS    6 Minute Walk Test    10 Meter Gait Speed    (Blank rows = not tested)   TODAY'S  TREATMENT   Gait Training Pt able to ambulate into clinic with hop-to pattern using rolling walker without his prosthetic. Throughout the course of his evaluation he ambulated approximately 500' with LLE BKA prosthesis and rolling walker. Pt utilizes partial step-through pattern. Extensive ambulation performed with patient in the // bars, rehab gym, hallway, and out to his car in the parking lot at the end of the session. In the // bars he is able to take a few small steps without UE support but notable instability in LLE withotu UE support. Pt requires cue for LLE heel strike during gait with walker initially but quickly integrates into his ambulation. He also initially steps too close to the front of his walker so cued to increase distance of anterior movement of walker prior to stepping. No overt LOB requiring any external correction from therapist.    PATIENT EDUCATION:  Education details: Plan of care, safe for short distance ambulation with front wheeled walker inside the house utilizing LLE prosthesis. Pt educated about prosthesis wear schedule. Also discussed wear schedule for liner/shrinker. Educated about monitoring for signs of redness or infection.  Person educated: Patient and friend Education method: Explanation and Verbal cues Education comprehension: verbalized understanding and needs further education   HOME EXERCISE PROGRAM:  Continue HH PT HEP, will update at future appointment  ASSESSMENT:  CLINICAL IMPRESSION: Patient is a 73 y.o. male who was seen today for physical therapy evaluation and treatment for LLE BKA prosthesis use.   OBJECTIVE IMPAIRMENTS: Abnormal gait, decreased balance, difficulty walking, decreased strength, prosthetic dependency , and pain.   ACTIVITY LIMITATIONS: carrying, standing, squatting, stairs, and transfers  PARTICIPATION LIMITATIONS: meal prep, cleaning, laundry, driving, shopping, community activity, occupation, and yard work  PERSONAL  FACTORS: Age, Past/current experiences, and 3+ comorbidities: Afib, CKD, CAD, DM, and HTN  are also affecting patient's functional outcome.   REHAB POTENTIAL: Excellent  CLINICAL DECISION MAKING: Unstable/unpredictable  EVALUATION COMPLEXITY: High   GOALS: Goals reviewed with patient? No  SHORT TERM GOALS: Target date: 09/28/2023  Pt will be independent with HEP in order to improve strength and balance in order to decrease fall risk and improve function at home. Baseline:  Goal status: INITIAL   LONG TERM GOALS: Target date: 11/09/2023  1.  Pt will improve BERG by at least 3 points in order to demonstrate clinically significant improvement in balance.   Baseline: To be completed Goal status: INITIAL  2. Pt will decrease 5TSTS by at least 3 seconds in order to demonstrate clinically significant improvement in LE strength      Baseline: To be completed Goal status: INITIAL  3. Pt will decrease TUG to below 14 seconds/decrease in order to demonstrate decreased fall risk.  Baseline: To be completed Goal status: INITIAL  4. Pt will increase by at least 89m (11ft) in order to demonstrate clinically significant improvement in cardiopulmonary endurance and community ambulation   Baseline: To be completed Goal status: INITIAL   PLAN: PT FREQUENCY: 1-2x/week  PT DURATION: 12 weeks  PLANNED INTERVENTIONS: Therapeutic exercises, Therapeutic activity, Neuromuscular re-education, Balance training, Gait training, Patient/Family education, Self Care, Joint mobilization, Joint manipulation, Vestibular training, Canalith repositioning, Orthotic/Fit training, DME instructions, Dry Needling, Electrical stimulation, Spinal manipulation, Spinal mobilization, Cryotherapy, Moist heat, Taping, Traction, Ultrasound, Ionotophoresis 4mg /ml Dexamethasone , Manual therapy, and Re-evaluation.  PLAN FOR NEXT SESSION: BLE MMT, L knee and hip ROM measures, BERG, TUG, 5TSTS, , review/modify HEP  provided by [facility] PT   Selinda BIRCH Jamarii Banks PT, DPT, GCS  Watson Robarge 08/18/2023, 11:20 AM

## 2023-08-20 ENCOUNTER — Ambulatory Visit: Admitting: Physical Therapy

## 2023-08-20 ENCOUNTER — Encounter: Payer: Self-pay | Admitting: Physical Therapy

## 2023-08-20 DIAGNOSIS — R262 Difficulty in walking, not elsewhere classified: Secondary | ICD-10-CM | POA: Diagnosis not present

## 2023-08-20 DIAGNOSIS — Z89432 Acquired absence of left foot: Secondary | ICD-10-CM

## 2023-08-20 DIAGNOSIS — M6281 Muscle weakness (generalized): Secondary | ICD-10-CM

## 2023-08-20 DIAGNOSIS — M86172 Other acute osteomyelitis, left ankle and foot: Secondary | ICD-10-CM

## 2023-08-20 NOTE — Therapy (Signed)
 OUTPATIENT PHYSICAL THERAPY PROSTHETIC TREATMENT  Patient Name: Andrew Harding MRN: [id number] DOB:01/18/1951, 73 y.o., male Today's Date: 08/20/2023  END OF SESSION:  PT End of Session - 08/20/23 1019     Visit Number 2    Number of Visits 25    Date for PT Re-Evaluation 11/09/23    Authorization Type eval: 08/18/23    PT Start Time 1019    PT Stop Time 1117    PT Time Calculation (min) 58 min    Equipment Utilized During Treatment Gait belt    Activity Tolerance Patient tolerated treatment well    Behavior During Therapy WFL for tasks assessed/performed            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   Sepsis (HCC) 08/17/2022   Tubular adenoma    Vitamin D  deficiency    Past Surgical History:  Procedure Laterality Date   AMPUTATION Left 08/19/2022   Procedure: TRANSMETATARSAL AMPUTATION LEFT FOOT WITH IRRIGATION AND DEBRIDEMENT;  Surgeon: Pink Bridges, DPM;  Location: [facility];  Service: Podiatry;  Laterality: Left;   AMPUTATION Left 05/16/2023   Procedure: AMPUTATION BELOW KNEE;  Surgeon: Celso College, MD;  Location: [facility];  Service: General;  Laterality: Left;   AMPUTATION TOE Right 06/01/2015   Procedure: AMPUTATION TOE;  Surgeon: Sharlyn Deaner, DPM;  Location: [facility];  Service: Podiatry;  Laterality: Right;   AMPUTATION TOE Left 08/11/2022   Procedure: AMPUTATION TOE 2, 3, 4;  Surgeon: Anell Baptist, DPM;  Location: [facility];  Service: Podiatry;  Laterality: Left;   CATARACT EXTRACTION, BILATERAL      CIRCUMCISION N/A 06/12/2022   Procedure: CIRCUMCISION ADULT;  Surgeon: Dustin Gimenez, MD;  Location: [facility];  Service: Urology;  Laterality: N/A;   COLONOSCOPY WITH PROPOFOL  N/A 02/14/2016   Procedure: COLONOSCOPY WITH PROPOFOL ;  Surgeon: Deveron Fly, MD;  Location: [facility] ENDOSCOPY;  Service: Endoscopy;  Laterality: N/A;   COLONOSCOPY WITH PROPOFOL  N/A 01/07/2019   Procedure: COLONOSCOPY WITH PROPOFOL ;  Surgeon: Deveron Fly, MD;  Location: [facility] ENDOSCOPY;  Service: Endoscopy;  Laterality: N/A;   CORONARY ARTERY BYPASS GRAFT N/A 08/2012   EXCISION PARTIAL PHALANX Right 06/01/2015   Procedure: EXCISION PARTIAL PHALANX /  BONE;  Surgeon: Sharlyn Deaner, DPM;  Location: [facility];  Service: Podiatry;  Laterality: Right;   FLEXIBLE SIGMOIDOSCOPY N/A 05/29/2016   Procedure: FLEXIBLE SIGMOIDOSCOPY;  Surgeon: Deveron Fly, MD;  Location: [facility] ENDOSCOPY;  Service: Endoscopy;  Laterality: N/A;   INCISION AND DRAINAGE Left 08/23/2022   Procedure: INCISION AND DRAINAGE;  Surgeon: Anell Baptist, DPM;  Location: [facility];  Service: Podiatry;  Laterality: Left;   INCISION AND DRAINAGE OF WOUND Left 09/15/2022   Procedure: 11044 - DEBRIDE BONE and EXCISION IF 1ST METATARSAL BONE WITH  DELAY PRIMARY CLOSURE;  Surgeon: Anell Baptist, DPM;  Location: [facility];  Service: Podiatry;  Laterality: Left;   IRRIGATION AND DEBRIDEMENT FOOT  Left 02/28/2023   Procedure: IRRIGATION AND DEBRIDEMENT FOOT;  Surgeon: Anell Baptist, DPM;  Location: [facility];  Service: Orthopedics/Podiatry;  Laterality: Left;   KNEE ARTHROSCOPY Left    LOWER EXTREMITY ANGIOGRAPHY Left 08/22/2022   Procedure: Lower Extremity Angiography;  Surgeon: Jackquelyn Mass, MD;  Location: [facility];  Service: Cardiovascular;  Laterality: Left;   LOWER EXTREMITY ANGIOGRAPHY Left 05/11/2023   Procedure: Lower Extremity Angiography;  Surgeon: Celso College, MD;  Location: [facility];  Service: Cardiovascular;   Laterality: Left;   TEE WITHOUT CARDIOVERSION N/A 03/01/2023   Procedure: TRANSESOPHAGEAL ECHOCARDIOGRAM;  Surgeon: Alluri, Odessa Bene, MD;  Location: [facility];  Service: Cardiovascular;  Laterality: N/A;   WOUND DEBRIDEMENT Left 09/15/2022   Procedure: 11043 - DEBRIDE SKIN. MUSCLE FASCIA;  Surgeon: Anell Baptist, DPM;  Location: [facility];  Service: Podiatry;  Laterality: Left;   Patient Active Problem List   Diagnosis Date Noted   S/P BKA (below knee amputation) unilateral, 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   Gram-negative bacteremia 05/11/2023   Sepsis (HCC) 05/10/2023   Open wound of left foot with complication 05/10/2023   MSSA bacteremia 02/28/2023   Controlled type 2 diabetes mellitus with neuropathy (HCC) 02/27/2023   Postural dizziness with presyncope 02/26/2023   Diabetic peripheral neuropathy (HCC)    Wound drainage 09/02/2022   Diabetic foot infection with possible osteomyelitis, left  (HCC) 08/29/2022   S/P transmetatarsal amputation of foot, left (HCC) 08/29/2022   Acute osteomyelitis of left ankle or foot (HCC) 08/24/2022   Medication management 08/24/2022   Diabetic infection of left foot (HCC) 08/24/2022   Infection of left foot 08/19/2022   Atrial fibrillation (HCC) 08/19/2022   Cellulitis 08/17/2022   Hypotension due to hypovolemia 08/17/2022   Gout 04/05/2018   Vitamin D  deficiency, unspecified 04/05/2018   Diabetic ulcer of toe of left foot associated with type 2 diabetes mellitus, limited to breakdown of skin (HCC) 12/21/2017   Status post amputation of toe of right foot (HCC) 11/01/2015   CKD (chronic kidney disease) stage 3, GFR 30-59 ml/min (HCC) 10/08/2015   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   Osteomyelitis (HCC) 05/31/2015   Type 2 diabetes mellitus with stage 3b chronic kidney disease, with long-term current use of insulin  (HCC) 05/31/2015   History of osteomyelitis 05/31/2015   Benign essential hypertension 09/14/2014   Coronary artery disease 09/17/2012    Mixed hyperlipidemia 09/17/2012   CAD 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    PCP: Melchor Spoon, MD  REFERRING PROVIDER: Melchor Spoon, MD   REFERRING DIAG: [phone] (ICD-10-CM) - Acquired absence of left leg below knee   RATIONALE FOR EVALUATION AND TREATMENT: Rehabilitation  THERAPY DIAG: Difficulty in walking, not elsewhere classified  Muscle weakness (generalized)  S/P transmetatarsal amputation of foot, left (HCC)  Osteomyelitis of foot, left, acute (HCC)  ONSET DATE: 05/16/23  FOLLOW-UP APPT SCHEDULED WITH REFERRING PROVIDER: No    SUBJECTIVE:  SUBJECTIVE STATEMENT:  S/P L BKA 05/16/23  PERTINENT HISTORY:  MAXIMO SPRATLING is a 73 y.o. male who presented to vascular with left foot osteomyelitis status post previous transmetatarsal amputation. Podiatry deemed his foot was no longer salvageable. He underwent a left below-the-knee amputation on 05/16/23. After discharge he spent 1 week at [facility] and was discharged with [facility]. He has been consistent with his HEP. Pt denies any post-operative complications or signs of infection. He had a follow-up with vascular who considered him a K3 level ambulator and ordered him a carbon energy storing foot with hydraulic ankle and pin fit socket. He was fitted for his L BKA prosthesis at [facility] earlier this week and was able to ambulate in the parallel bars at their office. He was  provided a size II shrinker, short, 20-64mmHg as well as single and multi ply socks. He does have a lateral dogear that was considered possibly problematic with fitting by vascular. It has reduced considerably in size over the last few weeks and if it remains a problem for fit vascular has advised that it can be trimmed as an outpatient. Pt has tried some short distance, limited driving at  home.  Pain: Yes, intermittent, phantom L foot pain Numbness/Tingling: No Focal Weakness: Yes, L quad  Prior history of physical therapy for balance/gait:  Yes Dominant hand: right Imaging: No, denies recent imaging; Red flags: Negative for chills/fever, night sweats, nausea, vomiting, unrelenting pain  PRECAUTIONS: Fall  WEIGHT BEARING RESTRICTIONS: No  FALLS: Has patient fallen in last 6 months? Yes. Number of falls 1 , One fall since being back home.  Living Environment Lives with: lives alone, ex-wife and friend help occasionally; Lives in: House/apartment, bonus room upstairs but bedroom and bathroom on main level. Tub shower with tub bench, no grab bars, detachable shower head, elevated height commode Stairs: 7 steps to enter, stairs modified (lowered height and deepened each step by platform on each step. Pt and his walker are able to fit flat on each step.  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 He would like to begin walking as soon as possible.    OBJECTIVE:   Patient Surveys  ABC: 78.8%  Cognition Patient is oriented to person, place, and time.  Recent memory is intact.  Remote memory is intact.  Attention span and concentration are intact.  Expressive speech is intact.  Patient's fund of knowledge is within normal limits for educational level.    Gross Musculoskeletal Assessment Tremor: None Bulk: Slight decrease in L quad definition Tone: Normal Slight eschar on lateral side of LLE incision. Small dogear noted.  Posture: No gross abnormalities noted in standing or seated posture  AROM Deferred  LE  MMT: Deferred  Sensation Deferred  Reflexes Deferred  Bed mobility: Not assessed  Transfers: Assistive device utilized: Walker - 2 wheeled  Sit to stand: Modified independence Stand to sit: Modified independence Chair to chair: Modified independence Floor:  Not assessed Pt able to don and doff LLE prosthesis without assistance from therapist. His first attempt resulted in pin too anterior so removed and repeated by patient.  Curb:  Curb Comments: Not assessed  Stairs: Level of  Assistance: CGA Number of Stairs: 4  Height of Stairs: 6"  Comments: Pt able to ascend/descend stairs forward using L prosthesis with bilateral rails and step-to pattern. No overt LOB. Pt educated how to perform stair ascend/descend with rolling walker (backward with ascend and forward with descend). He is able to complete this twice safely with the rolling walker.  Gait: Distance walked: 500' Assistive device utilized: Environmental consultant - 2 wheeled, single ply sock Level of assistance: CGA Comments: Pt able to ambulate into clinic with hop-to pattern using rolling walker without his prosthetic. Throughout the course of his evaluation he ambulated approximately 500' with LLE BKA prosthesis and rolling walker. Pt utilizes partial step-through pattern. Extensive ambulation performed with patient in the // bars, rehab gym, hallway, and out to his car in the parking lot at the end of the session. In the // bars he is able to take a few small steps without UE support but notable instability in LLE withotu UE support. Pt requires cue for LLE heel strike during gait with walker initially but quickly integrates into his ambulation. He also initially steps too close to the front of his walker so cued to increase distance of anterior movement of walker prior to stepping. No overt LOB requiring any external correction from therapist.   Functional Outcome Measures  Results Comments  BERG    DGI    FGA    TUG    5TSTS    6  Minute Walk Test    10 Meter Gait Speed    (Blank rows = not tested)   TODAY'S TREATMENT   08/20/2023  Subjective:  No c/o L knee/residual limb symptoms.   Pt. Wearing prosthetic leg about 1-2 hours/day.   Pt. Drove yesterday for the 1st time with no issues.  Pt. Entered PT with use of RW and pts. Friend drove pt. To PT today.  PT discussed pts. 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   Neuro:  Sit to stands from gray chair requires 1 UE assist on arm rest 10x.  Standing posture correction/ wt. Shifting to L LE in //-bars with light to no UE assist (mirror feedback).    Prosthetic training:  Donned/doffed prosthetic leg and STM to L residual limb.  PT inspection distal/lateral residual limb and instructed pt. To avoid lotion on 2 small scabs.  Pt. Has been keeping residual limb clean/ dry and using Vaseline lotion for diabetics.    Pt. Independent managing prosthetic leg and instructed to slowly increase wear time as tolerated.    Gait training:  Gait in //-bars with B UE progressing to use of R UE only with consistent 2-point gait pattern 8 laps.  Walking high marches/ lateral walking/ backward walking in //-bars with light UE assist 5 laps each.  Banker.    Amb. In gym/ hallway with instruction on use of SPC with consistent recip. Gait pattern and 2-point gait pattern.  Pt. Had 2 small LOB and able to self-correct.  Marked improvement in gait sequencing with increase distance walked.     Discussed steps to bonus room at home.    PATIENT EDUCATION:  Education details: Plan of care, safe for short distance ambulation with front wheeled walker inside the house utilizing LLE prosthesis. Pt educated about prosthesis wear schedule. Also discussed wear schedule for liner/shrinker. Educated about monitoring for signs of redness or infection.  Person educated: Patient and friend Education method: Explanation  and Verbal cues Education comprehension: verbalized understanding and needs further education   HOME EXERCISE PROGRAM:  Continue HH PT HEP, will update at future appointment  ASSESSMENT:  CLINICAL IMPRESSION: Pt. Able to ambulate with mod. Independent and use of RW in clinic.  Pt. Progressing to use of SPC with SBA/CGA and consistent recip. Gait pattern.  Pt. Highly motivated to return to work and has to be able to ambulate without assistive device. Pt. Reports no pain in L residual limb t/o tx. Session and instructed in proper care of residual limb.  PT will issue a new HEP next tx.    OBJECTIVE IMPAIRMENTS: Abnormal gait, decreased balance, difficulty walking, decreased strength, prosthetic dependency , and pain.   ACTIVITY LIMITATIONS: carrying, standing, squatting, stairs, and transfers  PARTICIPATION LIMITATIONS: meal prep, cleaning, laundry, driving, shopping, community activity, occupation, and yard work  PERSONAL FACTORS: Age, Past/current experiences, and 3+ comorbidities: Afib, CKD, CAD, DM, and HTN  are also affecting patient's functional outcome.   REHAB POTENTIAL: Excellent  CLINICAL DECISION MAKING: Unstable/unpredictable  EVALUATION COMPLEXITY: High   GOALS: Goals reviewed with patient? No  SHORT TERM GOALS: Target date: 09/28/2023  Pt will be independent with HEP in order to improve strength and balance in order to decrease fall risk and improve function at home. Baseline:  Goal status: INITIAL   LONG TERM GOALS: Target date: 11/09/2023  1.  Pt will improve BERG by at least 3 points in order to demonstrate clinically significant improvement in balance.   Baseline: To be completed Goal status: INITIAL  2. Pt will decrease 5TSTS by at least 3 seconds in order to demonstrate clinically significant improvement in LE strength      Baseline: To be completed Goal status: INITIAL  3. Pt will decrease TUG to below 14 seconds/decrease in order to demonstrate  decreased fall risk.  Baseline: To be completed Goal status: INITIAL  4. Pt will increase by at least 26m (117ft) in order to demonstrate clinically significant improvement in cardiopulmonary endurance and community ambulation   Baseline: To be completed Goal status: INITIAL   PLAN: PT FREQUENCY: 1-2x/week  PT DURATION: 12 weeks  PLANNED INTERVENTIONS: Therapeutic exercises, Therapeutic activity, Neuromuscular re-education, Balance training, Gait training, Patient/Family education, Self Care, Joint mobilization, Joint manipulation, Vestibular training, Canalith repositioning, Orthotic/Fit training, DME instructions, Dry Needling, Electrical stimulation, Spinal manipulation, Spinal mobilization, Cryotherapy, Moist heat, Taping, Traction, Ultrasound, Ionotophoresis 4mg /ml Dexamethasone , Manual therapy, and Re-evaluation.  PLAN FOR NEXT SESSION: BLE MMT, L knee and hip ROM measures, BERG, TUG, 5TSTS, , review/modify HEP provided by [facility]  Lendell Quarry, PT, DPT # [phone] 08/20/2023, 1:28 PM

## 2023-08-22 ENCOUNTER — Encounter: Admitting: Physical Therapy

## 2023-08-23 ENCOUNTER — Encounter: Payer: Self-pay | Admitting: Physical Therapy

## 2023-08-23 ENCOUNTER — Ambulatory Visit: Admitting: Physical Therapy

## 2023-08-23 DIAGNOSIS — M6281 Muscle weakness (generalized): Secondary | ICD-10-CM

## 2023-08-23 DIAGNOSIS — R262 Difficulty in walking, not elsewhere classified: Secondary | ICD-10-CM

## 2023-08-23 DIAGNOSIS — Z89432 Acquired absence of left foot: Secondary | ICD-10-CM

## 2023-08-23 DIAGNOSIS — M86172 Other acute osteomyelitis, left ankle and foot: Secondary | ICD-10-CM

## 2023-08-23 NOTE — Therapy (Signed)
 OUTPATIENT PHYSICAL THERAPY PROSTHETIC TREATMENT  Patient Name: Andrew Harding MRN: [id number] DOB:1951-03-22, 73 y.o., male Today's Date: 08/23/2023  END OF SESSION:  PT End of Session - 08/23/23 0728     Visit Number 3    Number of Visits 25    Date for PT Re-Evaluation 11/09/23    Authorization Type eval: 08/18/23    PT Start Time 0811    PT Stop Time 0900    PT Time Calculation (min) 49 min    Equipment Utilized During Treatment Gait belt    Activity Tolerance Patient tolerated treatment well    Behavior During Therapy [facility] for tasks assessed/performed            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   Sepsis (HCC) 08/17/2022   Tubular adenoma    Vitamin D  deficiency    Past Surgical History:  Procedure Laterality Date   AMPUTATION Left 08/19/2022   Procedure: TRANSMETATARSAL AMPUTATION LEFT FOOT WITH IRRIGATION AND DEBRIDEMENT;  Surgeon: Pink Bridges, DPM;  Location: [facility];  Service: Podiatry;  Laterality: Left;   AMPUTATION Left 05/16/2023   Procedure: AMPUTATION BELOW KNEE;  Surgeon: Celso College, MD;  Location: [facility];  Service: General;  Laterality: Left;   AMPUTATION TOE Right 06/01/2015   Procedure: AMPUTATION TOE;  Surgeon: Sharlyn Deaner, DPM;  Location: [facility];  Service: Podiatry;  Laterality: Right;   AMPUTATION TOE Left 08/11/2022   Procedure: AMPUTATION TOE 2, 3, 4;  Surgeon: Anell Baptist, DPM;  Location: [facility];  Service: Podiatry;  Laterality: Left;   CATARACT EXTRACTION, BILATERAL      CIRCUMCISION N/A 06/12/2022   Procedure: CIRCUMCISION ADULT;  Surgeon: Dustin Gimenez, MD;  Location: [facility];  Service: Urology;  Laterality: N/A;   COLONOSCOPY WITH PROPOFOL  N/A 02/14/2016   Procedure: COLONOSCOPY WITH PROPOFOL ;  Surgeon: Deveron Fly, MD;  Location: [facility] ENDOSCOPY;  Service: Endoscopy;  Laterality: N/A;   COLONOSCOPY WITH PROPOFOL  N/A 01/07/2019   Procedure: COLONOSCOPY WITH PROPOFOL ;  Surgeon: Deveron Fly, MD;  Location: [facility] ENDOSCOPY;  Service: Endoscopy;  Laterality: N/A;   CORONARY ARTERY BYPASS GRAFT N/A 08/2012   EXCISION PARTIAL PHALANX Right 06/01/2015   Procedure: EXCISION PARTIAL PHALANX /  BONE;  Surgeon: Sharlyn Deaner, DPM;  Location: [facility];  Service: Podiatry;  Laterality: Right;   FLEXIBLE SIGMOIDOSCOPY N/A 05/29/2016   Procedure: FLEXIBLE SIGMOIDOSCOPY;  Surgeon: Deveron Fly, MD;  Location: [facility] ENDOSCOPY;  Service: Endoscopy;  Laterality: N/A;   INCISION AND DRAINAGE Left 08/23/2022   Procedure: INCISION AND DRAINAGE;  Surgeon: Anell Baptist, DPM;  Location: [facility];  Service: Podiatry;  Laterality: Left;   INCISION AND DRAINAGE OF WOUND Left 09/15/2022   Procedure: 11044 - DEBRIDE BONE and EXCISION IF 1ST METATARSAL BONE WITH  DELAY PRIMARY CLOSURE;  Surgeon: Anell Baptist, DPM;  Location: [facility];  Service: Podiatry;  Laterality: Left;   IRRIGATION AND DEBRIDEMENT FOOT  Left 02/28/2023   Procedure: IRRIGATION AND DEBRIDEMENT FOOT;  Surgeon: Anell Baptist, DPM;  Location: [facility];  Service: Orthopedics/Podiatry;  Laterality: Left;   KNEE ARTHROSCOPY Left    LOWER EXTREMITY ANGIOGRAPHY Left 08/22/2022   Procedure: Lower Extremity Angiography;  Surgeon: Jackquelyn Mass, MD;  Location: [facility];  Service: Cardiovascular;  Laterality: Left;   LOWER EXTREMITY ANGIOGRAPHY Left 05/11/2023   Procedure: Lower Extremity Angiography;  Surgeon: Celso College, MD;  Location: [facility];  Service: Cardiovascular;   Laterality: Left;   TEE WITHOUT CARDIOVERSION N/A 03/01/2023   Procedure: TRANSESOPHAGEAL ECHOCARDIOGRAM;  Surgeon: Alluri, Odessa Bene, MD;  Location: [facility];  Service: Cardiovascular;  Laterality: N/A;   WOUND DEBRIDEMENT Left 09/15/2022   Procedure: 11043 - DEBRIDE SKIN. MUSCLE FASCIA;  Surgeon: Anell Baptist, DPM;  Location: [facility];  Service: Podiatry;  Laterality: Left;   Patient Active Problem List   Diagnosis Date Noted   S/P BKA (below knee amputation) unilateral, 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   Gram-negative bacteremia 05/11/2023   Sepsis (HCC) 05/10/2023   Open wound of left foot with complication 05/10/2023   MSSA bacteremia 02/28/2023   Controlled type 2 diabetes mellitus with neuropathy (HCC) 02/27/2023   Postural dizziness with presyncope 02/26/2023   Diabetic peripheral neuropathy (HCC)    Wound drainage 09/02/2022   Diabetic foot infection with possible osteomyelitis, left  (HCC) 08/29/2022   S/P transmetatarsal amputation of foot, left (HCC) 08/29/2022   Acute osteomyelitis of left ankle or foot (HCC) 08/24/2022   Medication management 08/24/2022   Diabetic infection of left foot (HCC) 08/24/2022   Infection of left foot 08/19/2022   Atrial fibrillation (HCC) 08/19/2022   Cellulitis 08/17/2022   Hypotension due to hypovolemia 08/17/2022   Gout 04/05/2018   Vitamin D  deficiency, unspecified 04/05/2018   Diabetic ulcer of toe of left foot associated with type 2 diabetes mellitus, limited to breakdown of skin (HCC) 12/21/2017   Status post amputation of toe of right foot (HCC) 11/01/2015   CKD (chronic kidney disease) stage 3, GFR 30-59 ml/min (HCC) 10/08/2015   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   Osteomyelitis (HCC) 05/31/2015   Type 2 diabetes mellitus with stage 3b chronic kidney disease, with long-term current use of insulin  (HCC) 05/31/2015   History of osteomyelitis 05/31/2015   Benign essential hypertension 09/14/2014   Coronary artery disease 09/17/2012    Mixed hyperlipidemia 09/17/2012   CAD 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    PCP: Melchor Spoon, MD  REFERRING PROVIDER: Melchor Spoon, MD   REFERRING DIAG: [id number] (ICD-10-CM) - Acquired absence of left leg below knee   RATIONALE FOR EVALUATION AND TREATMENT: Rehabilitation  THERAPY DIAG: Difficulty in walking, not elsewhere classified  Muscle weakness (generalized)  S/P transmetatarsal amputation of foot, left (HCC)  Osteomyelitis of foot, left, acute (HCC)  ONSET DATE: 05/16/23  FOLLOW-UP APPT SCHEDULED WITH REFERRING PROVIDER: No    SUBJECTIVE:  SUBJECTIVE STATEMENT:  S/P L BKA 05/16/23  PERTINENT HISTORY:  Andrew Harding is a 73 y.o. male who presented to vascular with left foot osteomyelitis status post previous transmetatarsal amputation. Podiatry deemed his foot was no longer salvageable. He underwent a left below-the-knee amputation on 05/16/23. After discharge he spent 1 week at [facility] and was discharged with [facility] PT. He has been consistent with his HEP. Pt denies any post-operative complications or signs of infection. He had a follow-up with vascular who considered him a K3 level ambulator and ordered him a carbon energy storing foot with hydraulic ankle and pin fit socket. He was fitted for his L BKA prosthesis at [facility] earlier this week and was able to ambulate in the parallel bars at their office. He was  provided a size II shrinker, short, 20-86mmHg as well as single and multi ply socks. He does have a lateral dogear that was considered possibly problematic with fitting by vascular. It has reduced considerably in size over the last few weeks and if it remains a problem for fit vascular has advised that it can be trimmed as an outpatient. Pt has tried some short distance, limited driving at  home.  Pain: Yes, intermittent, phantom L foot pain Numbness/Tingling: No Focal Weakness: Yes, L quad  Prior history of physical therapy for balance/gait:  Yes Dominant hand: right Imaging: No, denies recent imaging; Red flags: Negative for chills/fever, night sweats, nausea, vomiting, unrelenting pain  PRECAUTIONS: Fall  WEIGHT BEARING RESTRICTIONS: No  FALLS: Has patient fallen in last 6 months? Yes. Number of falls 1 , One fall since being back home.  Living Environment Lives with: lives alone, ex-wife and friend help occasionally; Lives in: House/apartment, bonus room upstairs but bedroom and bathroom on main level. Tub shower with tub bench, no grab bars, detachable shower head, elevated height commode Stairs: 7 steps to enter, stairs modified (lowered height and deepened each step by platform on each step. Pt and his walker are able to fit flat on each step.  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 He would like to begin walking as soon as possible.   OBJECTIVE:   Patient Surveys  ABC: 78.8%  Cognition Patient is oriented to person, place, and time.  Recent memory is intact.  Remote memory is intact.  Attention span and concentration are intact.  Expressive speech is intact.  Patient's fund of knowledge is within normal limits for educational level.    Gross Musculoskeletal Assessment Tremor: None Bulk: Slight decrease in L quad definition Tone: Normal Slight eschar on lateral side of LLE incision. Small dogear noted.  Posture: No gross abnormalities noted in standing or seated posture  AROM Deferred  LE  MMT: Deferred  Sensation Deferred  Reflexes Deferred  Bed mobility: Not assessed  Transfers: Assistive device utilized: Walker - 2 wheeled  Sit to stand: Modified independence Stand to sit: Modified independence Chair to chair: Modified independence Floor:  Not assessed Pt able to don and doff LLE prosthesis without assistance from therapist. His first attempt resulted in pin too anterior so removed and repeated by patient.  Curb:  Curb Comments: Not assessed  Stairs: Level of Assistance:  CGA Number of Stairs: 4  Height of Stairs: 6"  Comments: Pt able to ascend/descend stairs forward using L prosthesis with bilateral rails and step-to pattern. No overt LOB. Pt educated how to perform stair ascend/descend with rolling walker (backward with ascend and forward with descend). He is able to complete this twice safely with the rolling walker.  Gait: Distance walked: 500' Assistive device utilized: Environmental consultant - 2 wheeled, single ply sock Level of assistance: CGA Comments: Pt able to ambulate into clinic with hop-to pattern using rolling walker without his prosthetic. Throughout the course of his evaluation he ambulated approximately 500' with LLE BKA prosthesis and rolling walker. Pt utilizes partial step-through pattern. Extensive ambulation performed with patient in the // bars, rehab gym, hallway, and out to his car in the parking lot at the end of the session. In the // bars he is able to take a few small steps without UE support but notable instability in LLE withotu UE support. Pt requires cue for LLE heel strike during gait with walker initially but quickly integrates into his ambulation. He also initially steps too close to the front of his walker so cued to increase distance of anterior movement of walker prior to stepping. No overt LOB requiring any external correction from therapist.   Functional Outcome Measures  Results Comments  BERG    DGI    FGA    TUG    5TSTS    6  Minute Walk Test    10 Meter Gait Speed    (Blank rows = not tested)   TODAY'S TREATMENT   08/23/2023  Subjective:  Pt. Wearing prosthetic leg about 4 hours/day (2 hours in morning/ 2 hours in afternoon).  Pt. Entered PT with use of RW and brought SPC.  PT discussed pts. 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   Pt. Want to participate with extra duty hours at [employer] for 2 hours for 2-3 days/week.    Gait training:  Gait in //-bars with B UE progressing to use of R UE only with consistent 2-point gait pattern 8 laps.  Walking high marches/ backward walking in //-bars with light UE assist 3 laps each.  Banker.    Ambulates 4 laps in //-bars with no UE assist working on consistent recip. Gait pattern.  Pt. Challenged with L LE wt. Bearing/ R swing through phase of gait.  No LOB but extra time/ cautious with L LE wt. Bearing without UE assist.      Amb. In gym/ hallway with instruction on use of SPC with consistent recip. Gait pattern and 2-point gait pattern.  Marked improvement in gait sequencing with increase distance walked.     Walking outside with [facility] and CGA while stepping down curb with L LE first.    Neuro.mm.:  Airex pad: standing wt. Shifting in //-bars/ step ups and downs with light to no UE assist.  CGA for safety and mirror feedback for posture.    TUG: 23.45 sec./ 21.62 sec.    Walking cone taps in //-bars with R UE assist and attempted without UE assist but limited.  Pt. Benefits from use of SPC at this time with short distance ambulation.       PATIENT EDUCATION:  Education details: Plan of care, safe for short distance ambulation with front wheeled walker inside the house utilizing LLE prosthesis. Pt educated about prosthesis wear schedule. Also discussed wear schedule  for liner/shrinker. Educated about monitoring for signs of redness or infection.  Person educated: Patient and  friend Education method: Explanation and Verbal cues Education comprehension: verbalized understanding and needs further education   HOME EXERCISE PROGRAM:  Continue HH PT HEP, will update at future appointment  ASSESSMENT:  CLINICAL IMPRESSION: Pt. Progressing to use of SPC with SBA/CGA and consistent recip. Gait pattern.  Pt. Highly motivated to return to work and has to be able to ambulate without assistive device. Pt. Reports no pain in L residual limb t/o tx. Session and instructed in proper care of residual limb.  Pt. Slowly increasing wear time of prosthetic leg at home and brought in [facility] today.  SPC properly sized to pt. And pt. Demonstrates improved 2-point gait pattern.    OBJECTIVE IMPAIRMENTS: Abnormal gait, decreased balance, difficulty walking, decreased strength, prosthetic dependency , and pain.   ACTIVITY LIMITATIONS: carrying, standing, squatting, stairs, and transfers  PARTICIPATION LIMITATIONS: meal prep, cleaning, laundry, driving, shopping, community activity, occupation, and yard work  PERSONAL FACTORS: Age, Past/current experiences, and 3+ comorbidities: Afib, CKD, CAD, DM, and HTN  are also affecting patient's functional outcome.   REHAB POTENTIAL: Excellent  CLINICAL DECISION MAKING: Unstable/unpredictable  EVALUATION COMPLEXITY: High   GOALS: Goals reviewed with patient? No  SHORT TERM GOALS: Target date: 09/28/2023  Pt will be independent with HEP in order to improve strength and balance in order to decrease fall risk and improve function at home. Baseline:  Goal status: INITIAL   LONG TERM GOALS: Target date: 11/09/2023  1.  Pt will improve BERG by at least 3 points in order to demonstrate clinically significant improvement in balance.   Baseline: To be completed Goal status: INITIAL  2. Pt will decrease 5TSTS by at least 3 seconds in order to demonstrate clinically significant improvement in LE strength      Baseline: To be completed Goal  status: INITIAL  3. Pt will decrease TUG to below 14 seconds/decrease in order to demonstrate decreased fall risk.  Baseline: To be completed Goal status: INITIAL  4. Pt will increase by at least 79m (151ft) in order to demonstrate clinically significant improvement in cardiopulmonary endurance and community ambulation   Baseline: To be completed Goal status: INITIAL   PLAN: PT FREQUENCY: 1-2x/week  PT DURATION: 12 weeks  PLANNED INTERVENTIONS: Therapeutic exercises, Therapeutic activity, Neuromuscular re-education, Balance training, Gait training, Patient/Family education, Self Care, Joint mobilization, Joint manipulation, Vestibular training, Canalith repositioning, Orthotic/Fit training, DME instructions, Dry Needling, Electrical stimulation, Spinal manipulation, Spinal mobilization, Cryotherapy, Moist heat, Taping, Traction, Ultrasound, Ionotophoresis 4mg /ml Dexamethasone , Manual therapy, and Re-evaluation.  PLAN FOR NEXT SESSION: CHECK LE MMT next visit and issue HEP.    Lendell Quarry, PT, DPT # [phone] 08/23/2023, 12:33 PM

## 2023-08-26 ENCOUNTER — Other Ambulatory Visit (INDEPENDENT_AMBULATORY_CARE_PROVIDER_SITE_OTHER): Payer: Self-pay | Admitting: Nurse Practitioner

## 2023-08-27 ENCOUNTER — Ambulatory Visit: Admitting: Physical Therapy

## 2023-08-27 ENCOUNTER — Encounter: Payer: Self-pay | Admitting: Physical Therapy

## 2023-08-27 DIAGNOSIS — M86172 Other acute osteomyelitis, left ankle and foot: Secondary | ICD-10-CM

## 2023-08-27 DIAGNOSIS — M6281 Muscle weakness (generalized): Secondary | ICD-10-CM

## 2023-08-27 DIAGNOSIS — R262 Difficulty in walking, not elsewhere classified: Secondary | ICD-10-CM | POA: Diagnosis not present

## 2023-08-27 DIAGNOSIS — Z89432 Acquired absence of left foot: Secondary | ICD-10-CM

## 2023-08-27 NOTE — Therapy (Signed)
 OUTPATIENT PHYSICAL THERAPY PROSTHETIC TREATMENT  Patient Name: Andrew Harding MRN: [id number] DOB:14-Mar-1951, 73 y.o., male Today's Date: 08/27/2023  END OF SESSION:  PT End of Session - 08/27/23 1025     Visit Number 4    Number of Visits 25    Date for PT Re-Evaluation 11/09/23    Authorization Type eval: 08/18/23    PT Start Time 1018    PT Stop Time 1115    PT Time Calculation (min) 57 min    Equipment Utilized During Treatment Gait belt    Activity Tolerance Patient tolerated treatment well    Behavior During Therapy WFL for tasks assessed/performed            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   Sepsis (HCC) 08/17/2022   Tubular adenoma    Vitamin D  deficiency    Past Surgical History:  Procedure Laterality Date   AMPUTATION Left 08/19/2022   Procedure: TRANSMETATARSAL AMPUTATION LEFT FOOT WITH IRRIGATION AND DEBRIDEMENT;  Surgeon: Pink Bridges, DPM;  Location: [facility];  Service: Podiatry;  Laterality: Left;   AMPUTATION Left 05/16/2023   Procedure: AMPUTATION BELOW KNEE;  Surgeon: Celso College, MD;  Location: [facility];  Service: General;  Laterality: Left;   AMPUTATION TOE Right 06/01/2015   Procedure: AMPUTATION TOE;  Surgeon: Sharlyn Deaner, DPM;  Location: [facility];  Service: Podiatry;  Laterality: Right;   AMPUTATION TOE Left 08/11/2022   Procedure: AMPUTATION TOE 2, 3, 4;  Surgeon: Anell Baptist, DPM;  Location: [facility];  Service: Podiatry;  Laterality: Left;   CATARACT EXTRACTION, BILATERAL      CIRCUMCISION N/A 06/12/2022   Procedure: CIRCUMCISION ADULT;  Surgeon: Dustin Gimenez, MD;  Location: [facility];  Service: Urology;  Laterality: N/A;   COLONOSCOPY WITH PROPOFOL  N/A 02/14/2016   Procedure: COLONOSCOPY WITH PROPOFOL ;  Surgeon: Deveron Fly, MD;  Location: [facility] ENDOSCOPY;  Service: Endoscopy;  Laterality: N/A;   COLONOSCOPY WITH PROPOFOL  N/A 01/07/2019   Procedure: COLONOSCOPY WITH PROPOFOL ;  Surgeon: Deveron Fly, MD;  Location: [facility]COPY;  Service: Endoscopy;  Laterality: N/A;   CORONARY ARTERY BYPASS GRAFT N/A 08/2012   EXCISION PARTIAL PHALANX Right 06/01/2015   Procedure: EXCISION PARTIAL PHALANX /  BONE;  Surgeon: Sharlyn Deaner, DPM;  Location: [facility];  Service: Podiatry;  Laterality: Right;   FLEXIBLE SIGMOIDOSCOPY N/A 05/29/2016   Procedure: FLEXIBLE SIGMOIDOSCOPY;  Surgeon: Deveron Fly, MD;  Location: [facility] ENDOSCOPY;  Service: Endoscopy;  Laterality: N/A;   INCISION AND DRAINAGE Left 08/23/2022   Procedure: INCISION AND DRAINAGE;  Surgeon: Anell Baptist, DPM;  Location: [facility];  Service: Podiatry;  Laterality: Left;   INCISION AND DRAINAGE OF WOUND Left 09/15/2022   Procedure: 11044 - DEBRIDE BONE and EXCISION IF 1ST METATARSAL BONE WITH  DELAY PRIMARY CLOSURE;  Surgeon: Anell Baptist, DPM;  Location: [facility];  Service: Podiatry;  Laterality: Left;   IRRIGATION AND DEBRIDEMENT FOOT  Left 02/28/2023   Procedure: IRRIGATION AND DEBRIDEMENT FOOT;  Surgeon: Anell Baptist, DPM;  Location: [facility];  Service: Orthopedics/Podiatry;  Laterality: Left;   KNEE ARTHROSCOPY Left    LOWER EXTREMITY ANGIOGRAPHY Left 08/22/2022   Procedure: Lower Extremity Angiography;  Surgeon: Jackquelyn Mass, MD;  Location: [facility];  Service: Cardiovascular;  Laterality: Left;   LOWER EXTREMITY ANGIOGRAPHY Left 05/11/2023   Procedure: Lower Extremity Angiography;  Surgeon: Celso College, MD;  Location: [facility];  Service: Cardiovascular;   Laterality: Left;   TEE WITHOUT CARDIOVERSION N/A 03/01/2023   Procedure: TRANSESOPHAGEAL ECHOCARDIOGRAM;  Surgeon: Alluri, Odessa Bene, MD;  Location: [facility];  Service: Cardiovascular;  Laterality: N/A;   WOUND DEBRIDEMENT Left 09/15/2022   Procedure: 11043 - DEBRIDE SKIN. MUSCLE FASCIA;  Surgeon: Anell Baptist, DPM;  Location: [facility];  Service: Podiatry;  Laterality: Left;   Patient Active Problem List   Diagnosis Date Noted   S/P BKA (below knee amputation) unilateral, 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   Gram-negative bacteremia 05/11/2023   Sepsis (HCC) 05/10/2023   Open wound of left foot with complication 05/10/2023   MSSA bacteremia 02/28/2023   Controlled type 2 diabetes mellitus with neuropathy (HCC) 02/27/2023   Postural dizziness with presyncope 02/26/2023   Diabetic peripheral neuropathy (HCC)    Wound drainage 09/02/2022   Diabetic foot infection with possible osteomyelitis, left  (HCC) 08/29/2022   S/P transmetatarsal amputation of foot, left (HCC) 08/29/2022   Acute osteomyelitis of left ankle or foot (HCC) 08/24/2022   Medication management 08/24/2022   Diabetic infection of left foot (HCC) 08/24/2022   Infection of left foot 08/19/2022   Atrial fibrillation (HCC) 08/19/2022   Cellulitis 08/17/2022   Hypotension due to hypovolemia 08/17/2022   Gout 04/05/2018   Vitamin D  deficiency, unspecified 04/05/2018   Diabetic ulcer of toe of left foot associated with type 2 diabetes mellitus, limited to breakdown of skin (HCC) 12/21/2017   Status post amputation of toe of right foot (HCC) 11/01/2015   CKD (chronic kidney disease) stage 3, GFR 30-59 ml/min (HCC) 10/08/2015   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   Osteomyelitis (HCC) 05/31/2015   Type 2 diabetes mellitus with stage 3b chronic kidney disease, with long-term current use of insulin  (HCC) 05/31/2015   History of osteomyelitis 05/31/2015   Benign essential hypertension 09/14/2014   Coronary artery disease 09/17/2012    Mixed hyperlipidemia 09/17/2012   CAD 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    PCP: Melchor Spoon, MD  REFERRING PROVIDER: Melchor Spoon, MD   REFERRING DIAG: [phone] (ICD-10-CM) - Acquired absence of left leg below knee   RATIONALE FOR EVALUATION AND TREATMENT: Rehabilitation  THERAPY DIAG: Difficulty in walking, not elsewhere classified  Muscle weakness (generalized)  S/P transmetatarsal amputation of foot, left (HCC)  Osteomyelitis of foot, left, acute (HCC)  ONSET DATE: 05/16/23  FOLLOW-UP APPT SCHEDULED WITH REFERRING PROVIDER: No    SUBJECTIVE:  SUBJECTIVE STATEMENT:  S/P L BKA 05/16/23  PERTINENT HISTORY:  ZAHRAN BURFORD is a 73 y.o. male who presented to vascular with left foot osteomyelitis status post previous transmetatarsal amputation. Podiatry deemed his foot was no longer salvageable. He underwent a left below-the-knee amputation on 05/16/23. After discharge he spent 1 week at [facility] and was discharged with [facility] PT. He has been consistent with his HEP. Pt denies any post-operative complications or signs of infection. He had a follow-up with vascular who considered him a K3 level ambulator and ordered him a carbon energy storing foot with hydraulic ankle and pin fit socket. He was fitted for his L BKA prosthesis at [facility] earlier this week and was able to ambulate in the parallel bars at their office. He was  provided a size II shrinker, short, 20-60mmHg as well as single and multi ply socks. He does have a lateral dogear that was considered possibly problematic with fitting by vascular. It has reduced considerably in size over the last few weeks and if it remains a problem for fit vascular has advised that it can be trimmed as an outpatient. Pt has tried some short distance, limited driving at  home.  Pain: Yes, intermittent, phantom L foot pain Numbness/Tingling: No Focal Weakness: Yes, L quad  Prior history of physical therapy for balance/gait:  Yes Dominant hand: right Imaging: No, denies recent imaging; Red flags: Negative for chills/fever, night sweats, nausea, vomiting, unrelenting pain  PRECAUTIONS: Fall  WEIGHT BEARING RESTRICTIONS: No  FALLS: Has patient fallen in last 6 months? Yes. Number of falls 1 , One fall since being back home.  Living Environment Lives with: lives alone, ex-wife and friend help occasionally; Lives in: House/apartment, bonus room upstairs but bedroom and bathroom on main level. Tub shower with tub bench, no grab bars, detachable shower head, elevated height commode Stairs: 7 steps to enter, stairs modified (lowered height and deepened each step by platform on each step. Pt and his walker are able to fit flat on each step.  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 He would like to begin walking as soon as possible.   OBJECTIVE:   Patient Surveys  ABC: 78.8%  Cognition Patient is oriented to person, place, and time.  Recent memory is intact.  Remote memory is intact.  Attention span and concentration are intact.  Expressive speech is intact.  Patient's fund of knowledge is within normal limits for educational level.    Gross Musculoskeletal Assessment Tremor: None Bulk: Slight decrease in L quad definition Tone: Normal Slight eschar on lateral side of LLE incision. Small dogear noted.  Posture: No gross abnormalities noted in standing or seated posture  AROM Deferred  LE  MMT: Deferred  Sensation Deferred  Reflexes Deferred  Bed mobility: Not assessed  Transfers: Assistive device utilized: Walker - 2 wheeled  Sit to stand: Modified independence Stand to sit: Modified independence Chair to chair: Modified independence Floor:  Not assessed Pt able to don and doff LLE prosthesis without assistance from therapist. His first attempt resulted in pin too anterior so removed and repeated by patient.  Curb:  Curb Comments: Not assessed  Stairs: Level of Assistance:  CGA Number of Stairs: 4  Height of Stairs: 6"  Comments: Pt able to ascend/descend stairs forward using L prosthesis with bilateral rails and step-to pattern. No overt LOB. Pt educated how to perform stair ascend/descend with rolling walker (backward with ascend and forward with descend). He is able to complete this twice safely with the rolling walker.  Gait: Distance walked: 500' Assistive device utilized: Environmental consultant - 2 wheeled, single ply sock Level of assistance: CGA Comments: Pt able to ambulate into clinic with hop-to pattern using rolling walker without his prosthetic. Throughout the course of his evaluation he ambulated approximately 500' with LLE BKA prosthesis and rolling walker. Pt utilizes partial step-through pattern. Extensive ambulation performed with patient in the // bars, rehab gym, hallway, and out to his car in the parking lot at the end of the session. In the // bars he is able to take a few small steps without UE support but notable instability in LLE withotu UE support. Pt requires cue for LLE heel strike during gait with walker initially but quickly integrates into his ambulation. He also initially steps too close to the front of his walker so cued to increase distance of anterior movement of walker prior to stepping. No overt LOB requiring any external correction from therapist.   Functional Outcome Measures  Results Comments  BERG    DGI    FGA    TUG    5TSTS    6  Minute Walk Test    10 Meter Gait Speed    (Blank rows = not tested)  TUG: 23.45 sec./ 21.62 sec.     TODAY'S TREATMENT   08/27/2023  Subjective:  Pt. Wearing prosthetic leg about 4-5 hours/day (2+ hours in morning/ 2+ hours in afternoon).  Pt. Entered PT with use of SPC today.  No falls reported and pt. Stopped by employer at [employer] to determine walking/ limitations for prosthetic leg.  Pt. 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   Pt. Wants to participate with extra duty hours at [employer] for 2 hours for 2-3 days/week.  Pt. Returns to Dr. Althea Atkinson tomorrow and Wava Hagedorn on Wednesday and PT on Thursday.    There.ex.:  Nustep L4 10 min. B UE/LE (warm-up)  L/R LE strength testing: hip flexion (4/4+), abduction (4+/4+), quad (4+/5), hamstring (5/5).   Discussed sore area on bottom of R foot.  Pt. Returns to Dr. Althea Atkinson this week.    Gait training:  Gait in //-bars with B UE progressing to use of R UE only with consistent 2-point gait pattern 5 laps.  Walking high marches/ backward walking in //-bars with light UE assist 3 laps each.  Banker.  Challenged with backwards walking.    Amb. Outside on ramp/ grassy terrain/ parking lot with use of SPC and 2-point gait pattern.  Ascend/ descend outside stairs with step to pattern but able to ascend stairs with L LE and use of L handrail/ R SPC.  CGA for safety/ cuing.    Amb. In //-bars/ gym hallway with no assistive device.  Moderate LE muscle fatigue noted towards end of PT tx. Session.  CGA for safety due to L knee buckle with initial turning from chair.    Neuro.mm.:  Blaze pods lateral walking in //-bars (random set up)- 4 min.  With use of 1 UE and progressing to no UE assist.  Cuing for posture correction.  Standing wt. Shifting in //-bars with mirror feedback.    Sit to stands from mat table (varying heights)- with no UE assist  7x.    Amb. In //-bars with 6" hurdles with recip. Step overs and use of R UE only.   Not Today:  Airex pad: standing wt. Shifting in //-bars/ step ups and downs with light to no UE assist.  CGA for safety and mirror feedback for posture.    Walking cone taps in //-bars with R UE assist and attempted without UE assist but limited.  Pt. Benefits from use of SPC at this time with short distance ambulation.       PATIENT EDUCATION:  Education details: Plan of care, safe for short distance ambulation with front wheeled walker inside the house utilizing LLE prosthesis. Pt educated about prosthesis wear schedule. Also discussed wear schedule for liner/shrinker. Educated about monitoring for signs of redness or infection.  Person educated: Patient and friend Education method: Explanation and Verbal cues Education comprehension: verbalized understanding and needs further education   HOME EXERCISE PROGRAM:  Continue HH PT HEP, will update at future appointment  ASSESSMENT:  CLINICAL IMPRESSION: Pt. Progressing to use of SPC with mod.I/ SBA and consistent recip. Gait pattern.  Pt. Highly motivated to return to work and has to be able to ambulate without assistive device. Pt. Reports no pain in L residual limb t/o tx. Session and instructed in proper care of residual limb.  Pt. Slowly increasing wear time of prosthetic leg at home and brought in [facility] today.  Pt. Instructed to increase walking distance with use of SPC and amb. Short distances at home without assistive device in safe environment.   OBJECTIVE IMPAIRMENTS: Abnormal gait, decreased balance, difficulty walking, decreased strength, prosthetic dependency , and pain.   ACTIVITY LIMITATIONS: carrying, standing, squatting, stairs, and transfers  PARTICIPATION LIMITATIONS: meal prep, cleaning, laundry, driving, shopping, community activity, occupation, and yard work  PERSONAL FACTORS: Age, Past/current experiences, and 3+ comorbidities:  Afib, CKD, CAD, DM, and HTN  are also affecting patient's functional outcome.   REHAB POTENTIAL: Excellent  CLINICAL DECISION MAKING: Unstable/unpredictable  EVALUATION COMPLEXITY: High   GOALS: Goals reviewed with patient? No  SHORT TERM GOALS: Target date: 09/28/2023  Pt will be independent with HEP in order to improve strength and balance in order to decrease fall risk and improve function at home. Baseline:  Goal status: INITIAL   LONG TERM GOALS: Target date: 11/09/2023  1.  Pt will improve BERG by at least 3 points in order to demonstrate clinically significant improvement in balance.   Baseline: To be completed Goal status: INITIAL  2. Pt will decrease 5TSTS by at least 3 seconds in order to demonstrate clinically significant improvement in LE strength      Baseline: To be completed Goal status: INITIAL  3. Pt will decrease TUG to below 14 seconds/decrease in order to demonstrate decreased fall risk.  Baseline: To be completed Goal status: INITIAL  4. Pt will increase by at least 8m (146ft) in order to demonstrate clinically significant improvement in cardiopulmonary endurance and community ambulation   Baseline: To be completed Goal status: INITIAL   PLAN: PT FREQUENCY: 1-2x/week  PT DURATION: 12 weeks  PLANNED INTERVENTIONS: Therapeutic exercises, Therapeutic activity, Neuromuscular re-education, Balance training, Gait training, Patient/Family education, Self Care, Joint mobilization, Joint manipulation, Vestibular training, Canalith repositioning, Orthotic/Fit training, DME instructions, Dry Needling, Electrical stimulation, Spinal manipulation, Spinal mobilization, Cryotherapy, Moist heat, Taping, Traction, Ultrasound, Ionotophoresis 4mg /ml Dexamethasone , Manual therapy,  and Re-evaluation.  PLAN FOR NEXT SESSION:  Progress gait training without SPC.  Functional reaching tasks.     Lendell Quarry, PT, DPT # [phone] 08/27/2023, 1:27 PM

## 2023-08-29 ENCOUNTER — Encounter: Admitting: Physical Therapy

## 2023-08-29 NOTE — Therapy (Signed)
 OUTPATIENT PHYSICAL THERAPY PROSTHETIC TREATMENT  Patient Name: Andrew Harding MRN: [id number] DOB:1950/07/04, 73 y.o., male Today's Date: 08/30/2023  END OF SESSION:  PT End of Session - 08/30/23 1113     Visit Number 5    Number of Visits 25    Date for PT Re-Evaluation 11/09/23    Authorization Type eval: 08/18/23    PT Start Time 1115    PT Stop Time 1156    PT Time Calculation (min) 41 min    Equipment Utilized During Treatment Gait belt    Activity Tolerance Patient tolerated treatment well    Behavior During Therapy WFL for tasks assessed/performed             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   Sepsis (HCC) 08/17/2022   Tubular adenoma    Vitamin D  deficiency    Past Surgical History:  Procedure Laterality Date   AMPUTATION Left 08/19/2022   Procedure: TRANSMETATARSAL AMPUTATION LEFT FOOT WITH IRRIGATION AND DEBRIDEMENT;  Surgeon: Pink Bridges, DPM;  Location: [facility];  Service: Podiatry;  Laterality: Left;   AMPUTATION Left 05/16/2023   Procedure: AMPUTATION BELOW KNEE;  Surgeon: Celso College, MD;  Location: [facility];  Service: General;  Laterality: Left;   AMPUTATION TOE Right 06/01/2015   Procedure: AMPUTATION TOE;  Surgeon: Sharlyn Deaner, DPM;  Location: [facility];  Service: Podiatry;  Laterality: Right;   AMPUTATION TOE Left 08/11/2022   Procedure: AMPUTATION TOE 2, 3, 4;  Surgeon: Anell Baptist, DPM;  Location: [facility];  Service: Podiatry;  Laterality: Left;   CATARACT EXTRACTION, BILATERAL      CIRCUMCISION N/A 06/12/2022   Procedure: CIRCUMCISION ADULT;  Surgeon: Dustin Gimenez, MD;  Location: [facility];  Service: Urology;  Laterality: N/A;   COLONOSCOPY WITH PROPOFOL  N/A 02/14/2016   Procedure: COLONOSCOPY WITH PROPOFOL ;  Surgeon: Deveron Fly, MD;  Location: [facility];  Service: Endoscopy;  Laterality: N/A;   COLONOSCOPY WITH PROPOFOL  N/A 01/07/2019   Procedure: COLONOSCOPY WITH PROPOFOL ;  Surgeon: Deveron Fly, MD;  Location: [facility] ENDOSCOPY;  Service: Endoscopy;  Laterality: N/A;   CORONARY ARTERY BYPASS GRAFT N/A 08/2012   EXCISION PARTIAL PHALANX Right 06/01/2015   Procedure: EXCISION PARTIAL PHALANX /  BONE;  Surgeon: Sharlyn Deaner, DPM;  Location: [facility];  Service: Podiatry;  Laterality: Right;   FLEXIBLE SIGMOIDOSCOPY N/A 05/29/2016   Procedure: FLEXIBLE SIGMOIDOSCOPY;  Surgeon: Deveron Fly, MD;  Location: [facility] ENDOSCOPY;  Service: Endoscopy;  Laterality: N/A;   INCISION AND DRAINAGE Left 08/23/2022   Procedure: INCISION AND DRAINAGE;  Surgeon: Anell Baptist, DPM;  Location: [facility];  Service: Podiatry;  Laterality: Left;   INCISION AND DRAINAGE OF WOUND Left 09/15/2022   Procedure: 11044 - DEBRIDE BONE and EXCISION IF 1ST METATARSAL BONE WITH  DELAY PRIMARY CLOSURE;  Surgeon: Anell Baptist, DPM;  Location: [facility];  Service: Podiatry;  Laterality: Left;   IRRIGATION AND DEBRIDEMENT  FOOT Left 02/28/2023   Procedure: IRRIGATION AND DEBRIDEMENT FOOT;  Surgeon: Anell Baptist, DPM;  Location: [facility];  Service: Orthopedics/Podiatry;  Laterality: Left;   KNEE ARTHROSCOPY Left    LOWER EXTREMITY ANGIOGRAPHY Left 08/22/2022   Procedure: Lower Extremity Angiography;  Surgeon: Jackquelyn Mass, MD;  Location: [facility];  Service: Cardiovascular;  Laterality: Left;   LOWER EXTREMITY ANGIOGRAPHY Left 05/11/2023   Procedure: Lower Extremity Angiography;  Surgeon: Celso College, MD;  Location: [facility];  Service: Cardiovascular;   Laterality: Left;   TEE WITHOUT CARDIOVERSION N/A 03/01/2023   Procedure: TRANSESOPHAGEAL ECHOCARDIOGRAM;  Surgeon: Alluri, Odessa Bene, MD;  Location: [facility];  Service: Cardiovascular;  Laterality: N/A;   WOUND DEBRIDEMENT Left 09/15/2022   Procedure: 11043 - DEBRIDE SKIN. MUSCLE FASCIA;  Surgeon: Anell Baptist, DPM;  Location: [facility];  Service: Podiatry;  Laterality: Left;   Patient Active Problem List   Diagnosis Date Noted   S/P BKA (below knee amputation) unilateral, 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   Gram-negative bacteremia 05/11/2023   Sepsis (HCC) 05/10/2023   Open wound of left foot with complication 05/10/2023   MSSA bacteremia 02/28/2023   Controlled type 2 diabetes mellitus with neuropathy (HCC) 02/27/2023   Postural dizziness with presyncope 02/26/2023   Diabetic peripheral neuropathy (HCC)    Wound drainage 09/02/2022   Diabetic foot infection with possible osteomyelitis, left  (HCC) 08/29/2022   S/P transmetatarsal amputation of foot, left (HCC) 08/29/2022   Acute osteomyelitis of left ankle or foot (HCC) 08/24/2022   Medication management 08/24/2022   Diabetic infection of left foot (HCC) 08/24/2022   Infection of left foot 08/19/2022   Atrial fibrillation (HCC) 08/19/2022   Cellulitis 08/17/2022   Hypotension due to hypovolemia 08/17/2022   Gout 04/05/2018   Vitamin D  deficiency, unspecified 04/05/2018   Diabetic ulcer of toe of left foot associated with type 2 diabetes mellitus, limited to breakdown of skin (HCC) 12/21/2017   Status post amputation of toe of right foot (HCC) 11/01/2015   CKD (chronic kidney disease) stage 3, GFR 30-59 ml/min (HCC) 10/08/2015   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   Osteomyelitis (HCC) 05/31/2015   Type 2 diabetes mellitus with stage 3b chronic kidney disease, with long-term current use of insulin  (HCC) 05/31/2015   History of osteomyelitis 05/31/2015   Benign essential hypertension 09/14/2014   Coronary artery disease 09/17/2012    Mixed hyperlipidemia 09/17/2012   CAD 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    PCP: Melchor Spoon, MD  REFERRING PROVIDER: Melchor Spoon, MD   REFERRING DIAG: [phone] (ICD-10-CM) - Acquired absence of left leg below knee   RATIONALE FOR EVALUATION AND TREATMENT: Rehabilitation  THERAPY DIAG: Difficulty in walking, not elsewhere classified  Muscle weakness (generalized)  ONSET DATE: 05/16/23  FOLLOW-UP APPT SCHEDULED WITH REFERRING PROVIDER: No    SUBJECTIVE:  SUBJECTIVE STATEMENT:  S/P L BKA 05/16/23  PERTINENT HISTORY:  MADDUX SZOT is a 73 y.o. male who presented to vascular with left foot osteomyelitis status post previous transmetatarsal amputation. Podiatry deemed his foot was no longer salvageable. He underwent a left below-the-knee amputation on 05/16/23. After discharge he spent 1 week at [facility] and was discharged with [facility] PT. He has been consistent with his HEP. Pt denies any post-operative complications or signs of infection. He had a follow-up with vascular who considered him a K3 level ambulator and ordered him a carbon energy storing foot with hydraulic ankle and pin fit socket. He was fitted for his L BKA prosthesis at [facility] earlier this week and was able to ambulate in the parallel bars at their office. He was  provided a size II shrinker, short, 20-42mmHg as well as single and multi ply socks. He does have a lateral dogear that was considered possibly problematic with fitting by vascular. It has reduced considerably in size over the last few weeks and if it remains a problem for fit vascular has advised that it can be trimmed as an outpatient. Pt has tried some short distance, limited driving at home.  Pain: Yes, intermittent, phantom L foot pain Numbness/Tingling: No Focal Weakness: Yes, L quad   Prior history of physical therapy for balance/gait:  Yes Dominant hand: right Imaging: No, denies recent imaging; Red flags: Negative for chills/fever, night sweats, nausea, vomiting, unrelenting pain  PRECAUTIONS: Fall  WEIGHT BEARING RESTRICTIONS: No  FALLS: Has patient fallen in last 6 months? Yes. Number of falls 1 , One fall since being back home.  Living Environment Lives with: lives alone, ex-wife and friend help occasionally; Lives in: House/apartment, bonus room upstairs but bedroom and bathroom on main level. Tub shower with tub bench, no grab bars, detachable shower head, elevated height commode Stairs: 7 steps to enter, stairs modified (lowered height and deepened each step by platform on each step. Pt and his Reice Bienvenue are able to fit flat on each step.  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 He would like to begin walking as soon as possible.   OBJECTIVE:   Patient Surveys  ABC: 78.8%  Cognition Patient is oriented to person, place, and time.  Recent memory is intact.  Remote memory is intact.  Attention span and concentration are intact.  Expressive speech is intact.  Patient's fund of knowledge is within normal limits for educational level.    Gross Musculoskeletal Assessment Tremor: None Bulk: Slight decrease in L quad definition Tone: Normal Slight eschar on lateral side of LLE incision. Small dogear noted.  Posture: No gross abnormalities noted in standing or seated posture  AROM Deferred  LE MMT: Deferred  Sensation Deferred  Reflexes Deferred  Bed mobility: Not assessed  Transfers: Assistive device  utilized: Annaleese Guier - 2 wheeled  Sit to stand: Modified independence Stand to sit: Modified independence Chair to chair: Modified independence Floor:  Not assessed Pt able to don and doff LLE prosthesis without assistance from therapist. His first attempt resulted in pin too anterior so removed and repeated by patient.  Curb:  Curb Comments: Not assessed  Stairs: Level of Assistance:  CGA Number of Stairs: 4  Height of Stairs: 6"  Comments: Pt able to ascend/descend stairs forward using L prosthesis with bilateral rails and step-to pattern. No overt LOB. Pt educated how to perform stair ascend/descend with rolling Jovonda Selner (backward with ascend and forward with descend). He is able to complete this twice safely with the rolling Sanskriti Greenlaw.  Gait: Distance walked: 500' Assistive device utilized: Environmental consultant - 2 wheeled, single ply sock Level of assistance: CGA Comments: Pt able to ambulate into clinic with hop-to pattern using rolling Hyman Crossan without his prosthetic. Throughout the course of his evaluation he ambulated approximately 500' with LLE BKA prosthesis and rolling Lige Lakeman. Pt utilizes partial step-through pattern. Extensive ambulation performed with patient in the // bars, rehab gym, hallway, and out to his car in the parking lot at the end of the session. In the // bars he is able to take a few small steps without UE support but notable instability in LLE withotu UE support. Pt requires cue for LLE heel strike during gait with Terrius Gentile initially but quickly integrates into his ambulation. He also initially steps too close to the front of his Maddyson Keil so cued to increase distance of anterior movement of Dardan Shelton prior to stepping. No overt LOB requiring any external correction from therapist.   Functional Outcome Measures  Results Comments  BERG    DGI    FGA    TUG    5TSTS    6 Minute Walk Test    10 Meter Gait Speed    (Blank rows = not tested)  TUG: 23.45 sec./ 21.62 sec.     TODAY'S  TREATMENT   08/30/2023   Subjective:  Patient entered session with L prosthetic donned. Patient reports per MD, patient should minimize weightbearing on R foot due to ulcer. Patient states he hasn't been doing a great job but often sits in his recliner when not walking in the house to bathroom, kitchen, etc.    Gait training:  Sidestepping in // bars with no UE support x 4 laps   Amb. In //-bars with no assistive device.  Moderate LE muscle fatigue noted towards end of PT tx. Session.  CGA-SBA for safety   TA:  Nustep L4 10 min. B UE/LE (warm-up)  Sit to stands, seated on 2 airex pads 2 x 10 with no UE support   Amb. In //-bars with 6" hurdles with recip. Step overs and use of R UE only.   Not Today:  Airex pad: standing wt. Shifting in //-bars/ step ups and downs with light to no UE assist.  CGA for safety and mirror feedback for posture.    Walking cone taps in //-bars with R UE assist and attempted without UE assist but limited.  Pt. Benefits from use of SPC at this time with short distance ambulation.       PATIENT EDUCATION:  Education details: Plan of care, safe for short distance ambulation with front wheeled Gerardo Caiazzo inside the house utilizing LLE prosthesis. Pt educated about prosthesis wear schedule. Also discussed wear schedule for liner/shrinker. Educated about monitoring for signs of redness or infection.  Person educated: Patient and friend Education method: Explanation and Verbal cues Education comprehension: verbalized understanding and needs further education   HOME EXERCISE PROGRAM:  Continue HH PT HEP, will update at future appointment  ASSESSMENT:  CLINICAL IMPRESSION:   Patient advised to limit weightbearing to RLE which patient has been fairly sedentary at home. Session focused on ambulation without AD in // bars  with CGA-SBA. Patient seems to be progressing well towards goals and eager to progress towards ambulation with no AD to return work.   OBJECTIVE  IMPAIRMENTS: Abnormal gait, decreased balance, difficulty walking, decreased strength, prosthetic dependency , and pain.   ACTIVITY LIMITATIONS: carrying, standing, squatting, stairs, and transfers  PARTICIPATION LIMITATIONS: meal prep, cleaning, laundry, driving, shopping, community activity, occupation, and yard work  PERSONAL FACTORS: Age, Past/current experiences, and 3+ comorbidities: Afib, CKD, CAD, DM, and HTN  are also affecting patient's functional outcome.   REHAB POTENTIAL: Excellent  CLINICAL DECISION MAKING: Unstable/unpredictable  EVALUATION COMPLEXITY: High   GOALS: Goals reviewed with patient? No  SHORT TERM GOALS: Target date: 09/28/2023  Pt will be independent with HEP in order to improve strength and balance in order to decrease fall risk and improve function at home. Baseline:  Goal status: INITIAL   LONG TERM GOALS: Target date: 11/09/2023  1.  Pt will improve BERG by at least 3 points in order to demonstrate clinically significant improvement in balance.   Baseline: To be completed Goal status: INITIAL  2. Pt will decrease 5TSTS by at least 3 seconds in order to demonstrate clinically significant improvement in LE strength      Baseline: To be completed Goal status: INITIAL  3. Pt will decrease TUG to below 14 seconds/decrease in order to demonstrate decreased fall risk.  Baseline: To be completed Goal status: INITIAL  4. Pt will increase by at least 90m (174ft) in order to demonstrate clinically significant improvement in cardiopulmonary endurance and community ambulation   Baseline: To be completed Goal status: INITIAL   PLAN: PT FREQUENCY: 1-2x/week  PT DURATION: 12 weeks  PLANNED INTERVENTIONS: Therapeutic exercises, Therapeutic activity, Neuromuscular re-education, Balance training, Gait training, Patient/Family education, Self Care, Joint mobilization, Joint manipulation, Vestibular training, Canalith repositioning, Orthotic/Fit  training, DME instructions, Dry Needling, Electrical stimulation, Spinal manipulation, Spinal mobilization, Cryotherapy, Moist heat, Taping, Traction, Ultrasound, Ionotophoresis 4mg /ml Dexamethasone , Manual therapy, and Re-evaluation.  PLAN FOR NEXT SESSION:  Progress gait training without SPC.  Functional reaching tasks.     Janine Melbourne, PT, DPT Physical Therapist - [facility] 08/30/2023, 11:14 AM

## 2023-08-30 ENCOUNTER — Encounter: Payer: Self-pay | Admitting: Physical Therapy

## 2023-08-30 ENCOUNTER — Ambulatory Visit: Attending: Internal Medicine

## 2023-08-30 DIAGNOSIS — R262 Difficulty in walking, not elsewhere classified: Secondary | ICD-10-CM | POA: Diagnosis present

## 2023-08-30 DIAGNOSIS — M6281 Muscle weakness (generalized): Secondary | ICD-10-CM | POA: Diagnosis present

## 2023-09-02 DIAGNOSIS — I70219 Atherosclerosis of native arteries of extremities with intermittent claudication, unspecified extremity: Secondary | ICD-10-CM | POA: Insufficient documentation

## 2023-09-02 NOTE — H&P (View-Only) (Signed)
 MRN : [id number]  Andrew Harding is a 73 y.o. (1950-11-12) male who presents with chief complaint of check circulation.  History of Present Illness: The patient returns to the office for followup regarding atherosclerotic changes of the lower extremities.   He has developed an an ulcer underneath the right first ray likely from trying to modify his gait to use his below-knee prosthesis on the left.  He also has a small area in the midline of his below-knee amputation that is being irritated by his prosthesis  There have been no significant changes to the patient's overall health care.  The patient denies amaurosis fugax or recent TIA symptoms. There are no documented recent neurological changes noted. There is no history of DVT, PE or superficial thrombophlebitis. The patient denies recent episodes of angina or shortness of breath.    No outpatient medications have been marked as taking for the 09/03/23 encounter (Appointment) with Prescilla Brod, Ninette Basque, MD.    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   Sepsis (HCC) 08/17/2022   Tubular adenoma    Vitamin D  deficiency     Past Surgical History:  Procedure Laterality Date   AMPUTATION Left 08/19/2022   Procedure: TRANSMETATARSAL AMPUTATION LEFT FOOT WITH IRRIGATION AND DEBRIDEMENT;  Surgeon: Pink Bridges, DPM;  Location: [facility];  Service: Podiatry;  Laterality: Left;   AMPUTATION Left 05/16/2023    Procedure: AMPUTATION BELOW KNEE;  Surgeon: Celso College, MD;  Location: [facility];  Service: General;  Laterality: Left;   AMPUTATION TOE Right 06/01/2015   Procedure: AMPUTATION TOE;  Surgeon: Sharlyn Deaner, DPM;  Location: [facility];  Service: Podiatry;  Laterality: Right;   AMPUTATION TOE Left 08/11/2022   Procedure: AMPUTATION TOE 2, 3, 4;  Surgeon: Anell Baptist, DPM;  Location: [facility];  Service: Podiatry;  Laterality: Left;   CATARACT EXTRACTION, BILATERAL     CIRCUMCISION N/A 06/12/2022   Procedure: CIRCUMCISION ADULT;  Surgeon: Dustin Gimenez, MD;  Location: [facility];  Service: Urology;  Laterality: N/A;   COLONOSCOPY WITH PROPOFOL  N/A 02/14/2016   Procedure: COLONOSCOPY WITH PROPOFOL ;  Surgeon: Deveron Fly, MD;  Location: [facility] ENDOSCOPY;  Service: Endoscopy;  Laterality: N/A;   COLONOSCOPY WITH PROPOFOL  N/A 01/07/2019   Procedure: COLONOSCOPY WITH PROPOFOL ;  Surgeon: Deveron Fly, MD;  Location: [facility] ENDOSCOPY;  Service: Endoscopy;  Laterality: N/A;   CORONARY ARTERY BYPASS GRAFT N/A 08/2012   EXCISION PARTIAL PHALANX Right 06/01/2015   Procedure: EXCISION PARTIAL PHALANX /  BONE;  Surgeon: Sharlyn Deaner, DPM;  Location: [facility];  Service: Podiatry;  Laterality: Right;   FLEXIBLE SIGMOIDOSCOPY N/A 05/29/2016   Procedure: FLEXIBLE SIGMOIDOSCOPY;  Surgeon: Deveron Fly, MD;  Location: [facility] ENDOSCOPY;  Service: Endoscopy;  Laterality: N/A;   INCISION AND DRAINAGE Left 08/23/2022   Procedure: INCISION AND DRAINAGE;  Surgeon: Anell Baptist, DPM;  Location: [facility];  Service: Podiatry;  Laterality: Left;   INCISION AND DRAINAGE OF WOUND Left 09/15/2022   Procedure: 11044 - DEBRIDE BONE and EXCISION IF 1ST METATARSAL BONE WITH  DELAY PRIMARY CLOSURE;  Surgeon: Anell Baptist, DPM;  Location: [facility];  Service: Podiatry;  Laterality: Left;   IRRIGATION AND DEBRIDEMENT FOOT Left 02/28/2023   Procedure: IRRIGATION AND DEBRIDEMENT FOOT;  Surgeon: Anell Baptist, DPM;   Location: [facility];  Service: Orthopedics/Podiatry;  Laterality: Left;   KNEE ARTHROSCOPY Left    LOWER EXTREMITY ANGIOGRAPHY Left 08/22/2022   Procedure: Lower Extremity Angiography;  Surgeon: Jackquelyn Mass, MD;  Location: [facility];  Service: Cardiovascular;  Laterality: Left;   LOWER EXTREMITY ANGIOGRAPHY Left 05/11/2023   Procedure: Lower Extremity Angiography;  Surgeon: Celso College, MD;  Location: [facility];  Service: Cardiovascular;  Laterality: Left;   TEE WITHOUT CARDIOVERSION N/A 03/01/2023   Procedure: TRANSESOPHAGEAL ECHOCARDIOGRAM;  Surgeon: Alluri, Odessa Bene, MD;  Location: [facility];  Service: Cardiovascular;  Laterality: N/A;   WOUND DEBRIDEMENT Left 09/15/2022   Procedure: 11043 - DEBRIDE SKIN. MUSCLE FASCIA;  Surgeon: Anell Baptist, DPM;  Location: [facility];  Service: Podiatry;  Laterality: Left;    Social History Social History   Tobacco Use   Smoking status: Former    Types: Cigars   Smokeless tobacco: Never  Vaping Use   Vaping status: Never Used  Substance Use Topics   Alcohol use: No   Drug use: No    Family History Family History  Problem Relation Age of Onset   Diabetes Mellitus II Mother    CAD Mother     No Known Allergies   REVIEW OF SYSTEMS (Negative unless checked)  Constitutional: [] Weight loss  [] Fever  [] Chills Cardiac: [] Chest pain   [] Chest pressure   [] Palpitations   [] Shortness of breath when laying flat   [] Shortness of breath with exertion. Vascular:  [x] Pain in legs with walking   [] Pain in legs at rest  [] History of DVT   [] Phlebitis   [] Swelling in legs   [] Varicose veins   [] Non-healing ulcers Pulmonary:   [] Uses home oxygen   [] Productive cough   [] Hemoptysis   [] Wheeze  [] COPD   [] Asthma Neurologic:  [] Dizziness   [] Seizures   [] History of stroke   [] History of TIA  [] Aphasia   [] Vissual changes   [] Weakness or numbness in arm   [] Weakness or numbness in leg Musculoskeletal:   [] Joint swelling   [] Joint  pain   [] Low back pain Hematologic:  [] Easy bruising  [] Easy bleeding   [] Hypercoagulable state   [] Anemic Gastrointestinal:  [] Diarrhea   [] Vomiting  [] Gastroesophageal reflux/heartburn   [] Difficulty swallowing. Genitourinary:  [] Chronic kidney disease   [] Difficult urination  [] Frequent urination   [] Blood in urine Skin:  [] Rashes   [] Ulcers  Psychological:  [] History of anxiety   []  History of major depression.  Physical Examination  There were no vitals filed for this visit. There is no height or weight on file to calculate BMI. Gen: WD/WN, NAD Head: [place]/AT, No temporalis wasting.  Ear/Nose/Throat: Hearing grossly intact, nares w/o erythema or drainage Eyes: PER, EOMI, sclera nonicteric.  Neck: Supple, no masses.  No bruit or JVD.  Pulmonary:  Good air movement, no audible wheezing, no use of accessory muscles.  Cardiac: RRR, normal S1, S2, no Murmurs. Vascular:  mild trophic changes, right plantar open wound Vessel Right Left  Radial Palpable Palpable  PT Not Palpable BKA  DP Not Palpable BKA  Gastrointestinal: soft, non-distended. No guarding/no peritoneal signs.  Musculoskeletal: M/S 5/5 throughout.  No visible deformity.  Neurologic: CN 2-12 intact. Pain and light touch intact in extremities.  Symmetrical.  Speech is fluent. Motor exam as listed above. Psychiatric: Judgment intact, Mood & affect appropriate for pt's clinical situation. Dermatologic: No rashes or ulcers noted.  No changes consistent with cellulitis.   CBC Lab Results  Component Value Date   WBC 5.8 05/27/2023   HGB 10.8 (L) 05/27/2023   HCT 32.7 (L) 05/27/2023   MCV 86.3 05/27/2023   PLT 284 05/27/2023    BMET    Component Value Date/Time   NA 137 05/18/2023 0720   K 3.7 05/18/2023 0720   CL 108 05/18/2023 0720   CO2 21 (L) 05/18/2023 0720   GLUCOSE 172 (H) 05/18/2023 0720   GLUCOSE 402 (H) 09/17/2012 1417   BUN 10 05/18/2023 0720   CREATININE 1.05 05/18/2023 0720   CALCIUM 8.1 (L) 05/18/2023  0720   GFRNONAA >60 05/18/2023 0720   GFRAA 58 (L) 06/02/2015 0656   CrCl cannot be calculated (Patient's most recent lab result is older than the maximum 21 days allowed.).  COAG Lab Results  Component Value Date   INR 1.3 (H) 05/10/2023   INR 1.6 (H) 02/27/2023   INR 1.6 (H) 02/26/2023    Radiology No results found.   Assessment/Plan 1. Atherosclerosis of native arteries of the extremities with ulceration (HCC)  Recommend:  The patient has evidence of severe atherosclerotic changes of both lower extremities associated with ulceration and tissue loss of the right foot.  This represents a limb threatening ischemia and places the patient at the risk for right limb loss.  Patient should undergo angiography of the right lower extremity with the hope for intervention for limb salvage.  The risks and benefits as well as the alternative therapies was discussed in detail with the patient.  All questions were answered.  Patient agrees to proceed with right angiography.  The patient will follow up with me in the office after the procedure.  - MR FOOT RIGHT W WO CONTRAST; Future  2. S/P BKA (below knee amputation) unilateral, left (HCC) (Primary) Patient is wearing his prosthesis.  He does have an area of irritation.  I will contact Wava Hagedorn at [facility] to have him look at it.  3. Benign essential hypertension Continue antihypertensive medications as already ordered, these medications have been reviewed and there are no changes at this time.  4. 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  Continue statin as ordered and reviewed, no changes at this time  Nitrates PRN for chest pain  5. Mixed hyperlipidemia Continue statin as ordered and reviewed, no changes at this time    Devon Fogo, MD  09/02/2023 3:12 PM

## 2023-09-02 NOTE — Progress Notes (Signed)
 MRN : [id number]  Andrew Harding is a 73 y.o. (1950-11-12) male who presents with chief complaint of check circulation.  History of Present Illness: The patient returns to the office for followup regarding atherosclerotic changes of the lower extremities.   He has developed an an ulcer underneath the right first ray likely from trying to modify his gait to use his below-knee prosthesis on the left.  He also has a small area in the midline of his below-knee amputation that is being irritated by his prosthesis  There have been no significant changes to the patient's overall health care.  The patient denies amaurosis fugax or recent TIA symptoms. There are no documented recent neurological changes noted. There is no history of DVT, PE or superficial thrombophlebitis. The patient denies recent episodes of angina or shortness of breath.    No outpatient medications have been marked as taking for the 09/03/23 encounter (Appointment) with Prescilla Brod, Ninette Basque, MD.    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   Sepsis (HCC) 08/17/2022   Tubular adenoma    Vitamin D  deficiency     Past Surgical History:  Procedure Laterality Date   AMPUTATION Left 08/19/2022   Procedure: TRANSMETATARSAL AMPUTATION LEFT FOOT WITH IRRIGATION AND DEBRIDEMENT;  Surgeon: Pink Bridges, DPM;  Location: [facility];  Service: Podiatry;  Laterality: Left;   AMPUTATION Left 05/16/2023    Procedure: AMPUTATION BELOW KNEE;  Surgeon: Celso College, MD;  Location: [facility];  Service: General;  Laterality: Left;   AMPUTATION TOE Right 06/01/2015   Procedure: AMPUTATION TOE;  Surgeon: Sharlyn Deaner, DPM;  Location: [facility];  Service: Podiatry;  Laterality: Right;   AMPUTATION TOE Left 08/11/2022   Procedure: AMPUTATION TOE 2, 3, 4;  Surgeon: Anell Baptist, DPM;  Location: [facility];  Service: Podiatry;  Laterality: Left;   CATARACT EXTRACTION, BILATERAL     CIRCUMCISION N/A 06/12/2022   Procedure: CIRCUMCISION ADULT;  Surgeon: Dustin Gimenez, MD;  Location: [facility];  Service: Urology;  Laterality: N/A;   COLONOSCOPY WITH PROPOFOL  N/A 02/14/2016   Procedure: COLONOSCOPY WITH PROPOFOL ;  Surgeon: Deveron Fly, MD;  Location: [facility] ENDOSCOPY;  Service: Endoscopy;  Laterality: N/A;   COLONOSCOPY WITH PROPOFOL  N/A 01/07/2019   Procedure: COLONOSCOPY WITH PROPOFOL ;  Surgeon: Deveron Fly, MD;  Location: [facility] ENDOSCOPY;  Service: Endoscopy;  Laterality: N/A;   CORONARY ARTERY BYPASS GRAFT N/A 08/2012   EXCISION PARTIAL PHALANX Right 06/01/2015   Procedure: EXCISION PARTIAL PHALANX /  BONE;  Surgeon: Sharlyn Deaner, DPM;  Location: [facility];  Service: Podiatry;  Laterality: Right;   FLEXIBLE SIGMOIDOSCOPY N/A 05/29/2016   Procedure: FLEXIBLE SIGMOIDOSCOPY;  Surgeon: Deveron Fly, MD;  Location: [facility] ENDOSCOPY;  Service: Endoscopy;  Laterality: N/A;   INCISION AND DRAINAGE Left 08/23/2022   Procedure: INCISION AND DRAINAGE;  Surgeon: Anell Baptist, DPM;  Location: [facility];  Service: Podiatry;  Laterality: Left;   INCISION AND DRAINAGE OF WOUND Left 09/15/2022   Procedure: 11044 - DEBRIDE BONE and EXCISION IF 1ST METATARSAL BONE WITH  DELAY PRIMARY CLOSURE;  Surgeon: Anell Baptist, DPM;  Location: [facility];  Service: Podiatry;  Laterality: Left;   IRRIGATION AND DEBRIDEMENT FOOT Left 02/28/2023   Procedure: IRRIGATION AND DEBRIDEMENT FOOT;  Surgeon: Anell Baptist, DPM;   Location: [facility];  Service: Orthopedics/Podiatry;  Laterality: Left;   KNEE ARTHROSCOPY Left    LOWER EXTREMITY ANGIOGRAPHY Left 08/22/2022   Procedure: Lower Extremity Angiography;  Surgeon: Jackquelyn Mass, MD;  Location: [facility];  Service: Cardiovascular;  Laterality: Left;   LOWER EXTREMITY ANGIOGRAPHY Left 05/11/2023   Procedure: Lower Extremity Angiography;  Surgeon: Celso College, MD;  Location: [facility];  Service: Cardiovascular;  Laterality: Left;   TEE WITHOUT CARDIOVERSION N/A 03/01/2023   Procedure: TRANSESOPHAGEAL ECHOCARDIOGRAM;  Surgeon: Alluri, Odessa Bene, MD;  Location: [facility];  Service: Cardiovascular;  Laterality: N/A;   WOUND DEBRIDEMENT Left 09/15/2022   Procedure: 11043 - DEBRIDE SKIN. MUSCLE FASCIA;  Surgeon: Anell Baptist, DPM;  Location: [facility];  Service: Podiatry;  Laterality: Left;    Social History Social History   Tobacco Use   Smoking status: Former    Types: Cigars   Smokeless tobacco: Never  Vaping Use   Vaping status: Never Used  Substance Use Topics   Alcohol use: No   Drug use: No    Family History Family History  Problem Relation Age of Onset   Diabetes Mellitus II Mother    CAD Mother     No Known Allergies   REVIEW OF SYSTEMS (Negative unless checked)  Constitutional: [] Weight loss  [] Fever  [] Chills Cardiac: [] Chest pain   [] Chest pressure   [] Palpitations   [] Shortness of breath when laying flat   [] Shortness of breath with exertion. Vascular:  [x] Pain in legs with walking   [] Pain in legs at rest  [] History of DVT   [] Phlebitis   [] Swelling in legs   [] Varicose veins   [] Non-healing ulcers Pulmonary:   [] Uses home oxygen   [] Productive cough   [] Hemoptysis   [] Wheeze  [] COPD   [] Asthma Neurologic:  [] Dizziness   [] Seizures   [] History of stroke   [] History of TIA  [] Aphasia   [] Vissual changes   [] Weakness or numbness in arm   [] Weakness or numbness in leg Musculoskeletal:   [] Joint swelling   [] Joint  pain   [] Low back pain Hematologic:  [] Easy bruising  [] Easy bleeding   [] Hypercoagulable state   [] Anemic Gastrointestinal:  [] Diarrhea   [] Vomiting  [] Gastroesophageal reflux/heartburn   [] Difficulty swallowing. Genitourinary:  [] Chronic kidney disease   [] Difficult urination  [] Frequent urination   [] Blood in urine Skin:  [] Rashes   [] Ulcers  Psychological:  [] History of anxiety   []  History of major depression.  Physical Examination  There were no vitals filed for this visit. There is no height or weight on file to calculate BMI. Gen: WD/WN, NAD Head: [place]/AT, No temporalis wasting.  Ear/Nose/Throat: Hearing grossly intact, nares w/o erythema or drainage Eyes: PER, EOMI, sclera nonicteric.  Neck: Supple, no masses.  No bruit or JVD.  Pulmonary:  Good air movement, no audible wheezing, no use of accessory muscles.  Cardiac: RRR, normal S1, S2, no Murmurs. Vascular:  mild trophic changes, right plantar open wound Vessel Right Left  Radial Palpable Palpable  PT Not Palpable BKA  DP Not Palpable BKA  Gastrointestinal: soft, non-distended. No guarding/no peritoneal signs.  Musculoskeletal: M/S 5/5 throughout.  No visible deformity.  Neurologic: CN 2-12 intact. Pain and light touch intact in extremities.  Symmetrical.  Speech is fluent. Motor exam as listed above. Psychiatric: Judgment intact, Mood & affect appropriate for pt's clinical situation. Dermatologic: No rashes or ulcers noted.  No changes consistent with cellulitis.   CBC Lab Results  Component Value Date   WBC 5.8 05/27/2023   HGB 10.8 (L) 05/27/2023   HCT 32.7 (L) 05/27/2023   MCV 86.3 05/27/2023   PLT 284 05/27/2023    BMET    Component Value Date/Time   NA 137 05/18/2023 0720   K 3.7 05/18/2023 0720   CL 108 05/18/2023 0720   CO2 21 (L) 05/18/2023 0720   GLUCOSE 172 (H) 05/18/2023 0720   GLUCOSE 402 (H) 09/17/2012 1417   BUN 10 05/18/2023 0720   CREATININE 1.05 05/18/2023 0720   CALCIUM 8.1 (L) 05/18/2023  0720   GFRNONAA >60 05/18/2023 0720   GFRAA 58 (L) 06/02/2015 0656   CrCl cannot be calculated (Patient's most recent lab result is older than the maximum 21 days allowed.).  COAG Lab Results  Component Value Date   INR 1.3 (H) 05/10/2023   INR 1.6 (H) 02/27/2023   INR 1.6 (H) 02/26/2023    Radiology No results found.   Assessment/Plan 1. Atherosclerosis of native arteries of the extremities with ulceration (HCC)  Recommend:  The patient has evidence of severe atherosclerotic changes of both lower extremities associated with ulceration and tissue loss of the right foot.  This represents a limb threatening ischemia and places the patient at the risk for right limb loss.  Patient should undergo angiography of the right lower extremity with the hope for intervention for limb salvage.  The risks and benefits as well as the alternative therapies was discussed in detail with the patient.  All questions were answered.  Patient agrees to proceed with right angiography.  The patient will follow up with me in the office after the procedure.  - MR FOOT RIGHT W WO CONTRAST; Future  2. S/P BKA (below knee amputation) unilateral, left (HCC) (Primary) Patient is wearing his prosthesis.  He does have an area of irritation.  I will contact Wava Hagedorn at [facility] to have him look at it.  3. Benign essential hypertension Continue antihypertensive medications as already ordered, these medications have been reviewed and there are no changes at this time.  4. 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  Continue statin as ordered and reviewed, no changes at this time  Nitrates PRN for chest pain  5. Mixed hyperlipidemia Continue statin as ordered and reviewed, no changes at this time    Devon Fogo, MD  09/02/2023 3:12 PM

## 2023-09-03 ENCOUNTER — Ambulatory Visit
Admission: RE | Admit: 2023-09-03 | Discharge: 2023-09-03 | Disposition: A | Discharge status: Home / Self Care | Source: Ambulatory Visit | Attending: Vascular Surgery | Admitting: Vascular Surgery

## 2023-09-03 ENCOUNTER — Ambulatory Visit (INDEPENDENT_AMBULATORY_CARE_PROVIDER_SITE_OTHER): Payer: Medicare Other | Admitting: Vascular Surgery

## 2023-09-03 ENCOUNTER — Encounter: Admitting: Physical Therapy

## 2023-09-03 ENCOUNTER — Telehealth (INDEPENDENT_AMBULATORY_CARE_PROVIDER_SITE_OTHER): Payer: Self-pay | Admitting: Vascular Surgery

## 2023-09-03 VITALS — BP 121/80 | HR 84 | Resp 16

## 2023-09-03 DIAGNOSIS — I7025 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: Secondary | ICD-10-CM

## 2023-09-03 DIAGNOSIS — Z89512 Acquired absence of left leg below knee: Secondary | ICD-10-CM

## 2023-09-03 DIAGNOSIS — I25119 Atherosclerotic heart disease of native coronary artery with unspecified angina pectoris: Secondary | ICD-10-CM | POA: Diagnosis not present

## 2023-09-03 DIAGNOSIS — I70219 Atherosclerosis of native arteries of extremities with intermittent claudication, unspecified extremity: Secondary | ICD-10-CM

## 2023-09-03 DIAGNOSIS — I10 Essential (primary) hypertension: Secondary | ICD-10-CM

## 2023-09-03 DIAGNOSIS — E782 Mixed hyperlipidemia: Secondary | ICD-10-CM

## 2023-09-03 HISTORY — DX: 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: I70.25

## 2023-09-03 MED ORDER — GADOBUTROL 1 MMOL/ML IV SOLN
10.0000 mL | Freq: Once | INTRAVENOUS | Status: AC | PRN
  Administered 2023-09-03: 10 mL via INTRAVENOUS

## 2023-09-03 NOTE — Telephone Encounter (Signed)
 Spoke with pt to advise the emergent MRI ordered by Dr. Prescilla Brod has been approved via [employer]. I gave the number [phone]) to the patient and pt acknowledged that he will call and schedule ASAP per Dr. Prescilla Brod.

## 2023-09-04 ENCOUNTER — Other Ambulatory Visit: Payer: Self-pay | Admitting: Podiatry

## 2023-09-04 ENCOUNTER — Ambulatory Visit: Admitting: Physical Therapy

## 2023-09-05 ENCOUNTER — Encounter: Admitting: Physical Therapy

## 2023-09-06 ENCOUNTER — Ambulatory Visit: Admitting: Physical Therapy

## 2023-09-07 ENCOUNTER — Telehealth (INDEPENDENT_AMBULATORY_CARE_PROVIDER_SITE_OTHER): Payer: Self-pay | Admitting: Vascular Surgery

## 2023-09-07 ENCOUNTER — Telehealth (INDEPENDENT_AMBULATORY_CARE_PROVIDER_SITE_OTHER): Payer: Self-pay

## 2023-09-07 NOTE — Telephone Encounter (Signed)
 Patient is calling about scheduling angio. He is worried they will not do his foot surgery if the angio is not schedule. Patient is requesting a call back today.

## 2023-09-07 NOTE — Telephone Encounter (Signed)
 Called the patient to let him know that once the office note is complete I will apply for a prior authorization with his insurance and then schedule him for his leg angio with Dr. Prescilla Brod. Patient started getting irate and complaining about we are doing him wrong and how I don't care about what happens to him. I stated that we do all we can and care about taking care of our patients as well. Patient stated he wasn't going to have anything to do with us  ever again and tell everyone that we were awful and not to use us . I stated that was his right to do so but we are doing everything we could to get him scheduled. Patient then started cursing at me and telling me I needed to be cursed at and I terminated the conversation.

## 2023-09-09 ENCOUNTER — Encounter (INDEPENDENT_AMBULATORY_CARE_PROVIDER_SITE_OTHER): Payer: Self-pay | Admitting: Vascular Surgery

## 2023-09-10 ENCOUNTER — Encounter
Admission: RE | Admit: 2023-09-10 | Discharge: 2023-09-10 | Disposition: A | Discharge status: Home / Self Care | Source: Ambulatory Visit | Attending: Podiatry | Admitting: Podiatry

## 2023-09-10 ENCOUNTER — Telehealth (INDEPENDENT_AMBULATORY_CARE_PROVIDER_SITE_OTHER): Payer: Self-pay

## 2023-09-10 ENCOUNTER — Other Ambulatory Visit: Payer: Self-pay

## 2023-09-10 ENCOUNTER — Ambulatory Visit: Admitting: Physical Therapy

## 2023-09-10 VITALS — Ht 75.0 in | Wt 206.0 lb

## 2023-09-10 DIAGNOSIS — M86172 Other acute osteomyelitis, left ankle and foot: Secondary | ICD-10-CM

## 2023-09-10 DIAGNOSIS — E1142 Type 2 diabetes mellitus with diabetic polyneuropathy: Secondary | ICD-10-CM

## 2023-09-10 DIAGNOSIS — Z01812 Encounter for preprocedural laboratory examination: Secondary | ICD-10-CM

## 2023-09-10 DIAGNOSIS — E1140 Type 2 diabetes mellitus with diabetic neuropathy, unspecified: Secondary | ICD-10-CM

## 2023-09-10 NOTE — Patient Instructions (Addendum)
 Your procedure is scheduled on:  FRIDAY MAY 16  Report to the Registration Desk on the 1st floor of the [facility]. To find out your arrival time, please call [phone] between 1PM - 3PM on:  THURSDAY MAY 15  If your arrival time is 6:00 am, do not arrive before that time as the [facility] entrance doors do not open until 6:00 am.  REMEMBER: Instructions that are not followed completely may result in serious medical risk, up to and including death; or upon the discretion of your surgeon and anesthesiologist your surgery may need to be rescheduled.  Do not eat food after midnight the night before surgery.  No gum chewing or hard candies.  You may however, drink WATER up to 2 hours before you are scheduled to arrive for your surgery. Do not drink anything within 2 hours of your scheduled arrival time.  In addition, your doctor has ordered for you to drink the provided:  Gatorade G2  Drinking this carbohydrate drink up to two hours before surgery helps to reduce insulin  resistance and improve patient outcomes. Please complete drinking 2 hours before scheduled arrival time.  One week prior to surgery:  Stop Anti-inflammatories (NSAIDS) such as Advil, Aleve, Ibuprofen, Motrin, Naproxen, Naprosyn and Aspirin  based products such as Excedrin, Goody's Powder, BC Powder. Stop ANY OVER THE COUNTER supplements until after surgery.  You may however, continue to take Tylenol  if needed for pain up until the day of surgery.  **Follow guidelines for insulin  and diabetes medications.** insulin  glargine (LANTUS ) take 18 units the night before surgery ( the night of THURSDAY MAY 15)  metFORMIN (GLUCOPHAGE-XR) hold 2 days. Last dose TUESDAY MAY 13  OZEMPIC hold for 7 days prior to surgery, last dose TUESDAY MAY 8   **Follow recommendations regarding stopping blood thinners.** clopidogrel  (PLAVIX ) has been placed on hold  ELIQUIS  stop immediately per Dr. Althea Atkinson   Continue taking all of your other  prescription medications up until the day of surgery.  ON THE DAY OF SURGERY ONLY TAKE THESE MEDICATIONS WITH SIPS OF WATER:  cefUROXime (CEFTIN)  metoprolol  tartrate (LOPRESSOR )  sulfamethoxazole-trimethoprim (BACTRIM DS)   No Alcohol for 24 hours before or after surgery.  No Smoking including e-cigarettes for 24 hours before surgery.  No chewable tobacco products for at least 6 hours before surgery.  No nicotine patches on the day of surgery.  Do not use any "recreational" drugs for at least a week (preferably 2 weeks) before your surgery.  Please be advised that the combination of cocaine and anesthesia may have negative outcomes, up to and including death. If you test positive for cocaine, your surgery will be cancelled.  On the morning of surgery brush your teeth with toothpaste and water, you may rinse your mouth with mouthwash if you wish. Do not swallow any toothpaste or mouthwash.  Do not wear jewelry, make-up, hairpins, clips or nail polish.  For welded (permanent) jewelry: bracelets, anklets, waist bands, etc.  Please have this removed prior to surgery.  If it is not removed, there is a chance that hospital personnel will need to cut it off on the day of surgery.  Do not wear lotions, powders, or perfumes.   Do not shave body hair from the neck down 48 hours before surgery.  Contact lenses, hearing aids and dentures may not be worn into surgery.  Do not bring valuables to the hospital. [facility] is not responsible for any missing/lost belongings or valuables.   Notify your  doctor if there is any change in your medical condition (cold, fever, infection).  Wear comfortable clothing (specific to your surgery type) to the hospital.  After surgery, you can help prevent lung complications by doing breathing exercises.  Take deep breaths and cough every 1-2 hours.   If you are being discharged the day of surgery, you will not be allowed to drive home. You will need a  responsible individual to drive you home and stay with you for 24 hours after surgery.   If you are taking public transportation, you will need to have a responsible individual with you.  Please call the Pre-admissions Testing Dept. at [phone] if you have any questions about these instructions.  Surgery Visitation Policy:  Patients having surgery or a procedure may have two visitors.  Children under the age of 17 must have an adult with them who is not the patient.

## 2023-09-10 NOTE — Telephone Encounter (Signed)
 Spoke with the patient and he is scheduled with Dr. Prescilla Brod for a right leg angio on 09/11/23 with a 11:00 am arrival time to the [facility]. Pre-procedure instructions were discussed and will be sent to Mychart.

## 2023-09-11 ENCOUNTER — Other Ambulatory Visit: Payer: Self-pay

## 2023-09-11 ENCOUNTER — Encounter
Admission: RE | Disposition: A | Discharge status: Home / Self Care | Payer: Self-pay | Source: Home / Self Care | Attending: Vascular Surgery

## 2023-09-11 ENCOUNTER — Encounter: Payer: Self-pay | Admitting: Vascular Surgery

## 2023-09-11 ENCOUNTER — Ambulatory Visit
Admission: RE | Admit: 2023-09-11 | Discharge: 2023-09-11 | Disposition: A | Discharge status: Home / Self Care | Attending: Vascular Surgery | Admitting: Vascular Surgery

## 2023-09-11 DIAGNOSIS — Z87891 Personal history of nicotine dependence: Secondary | ICD-10-CM | POA: Diagnosis not present

## 2023-09-11 DIAGNOSIS — E11621 Type 2 diabetes mellitus with foot ulcer: Secondary | ICD-10-CM | POA: Diagnosis not present

## 2023-09-11 DIAGNOSIS — I7025 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: Secondary | ICD-10-CM

## 2023-09-11 DIAGNOSIS — M868X7 Other osteomyelitis, ankle and foot: Secondary | ICD-10-CM | POA: Diagnosis not present

## 2023-09-11 DIAGNOSIS — E1122 Type 2 diabetes mellitus with diabetic chronic kidney disease: Secondary | ICD-10-CM | POA: Insufficient documentation

## 2023-09-11 DIAGNOSIS — M869 Osteomyelitis, unspecified: Secondary | ICD-10-CM

## 2023-09-11 DIAGNOSIS — N189 Chronic kidney disease, unspecified: Secondary | ICD-10-CM | POA: Insufficient documentation

## 2023-09-11 DIAGNOSIS — I700 Atherosclerosis of aorta: Secondary | ICD-10-CM

## 2023-09-11 DIAGNOSIS — I70235 Atherosclerosis of native arteries of right leg with ulceration of other part of foot: Secondary | ICD-10-CM | POA: Insufficient documentation

## 2023-09-11 DIAGNOSIS — Z79899 Other long term (current) drug therapy: Secondary | ICD-10-CM | POA: Insufficient documentation

## 2023-09-11 DIAGNOSIS — I25119 Atherosclerotic heart disease of native coronary artery with unspecified angina pectoris: Secondary | ICD-10-CM | POA: Diagnosis not present

## 2023-09-11 DIAGNOSIS — L97519 Non-pressure chronic ulcer of other part of right foot with unspecified severity: Secondary | ICD-10-CM | POA: Insufficient documentation

## 2023-09-11 DIAGNOSIS — I129 Hypertensive chronic kidney disease with stage 1 through stage 4 chronic kidney disease, or unspecified chronic kidney disease: Secondary | ICD-10-CM | POA: Diagnosis not present

## 2023-09-11 DIAGNOSIS — I70213 Atherosclerosis of native arteries of extremities with intermittent claudication, bilateral legs: Secondary | ICD-10-CM | POA: Diagnosis not present

## 2023-09-11 DIAGNOSIS — E782 Mixed hyperlipidemia: Secondary | ICD-10-CM | POA: Insufficient documentation

## 2023-09-11 DIAGNOSIS — Z89512 Acquired absence of left leg below knee: Secondary | ICD-10-CM | POA: Insufficient documentation

## 2023-09-11 DIAGNOSIS — I70229 Atherosclerosis of native arteries of extremities with rest pain, unspecified extremity: Secondary | ICD-10-CM

## 2023-09-11 DIAGNOSIS — E1151 Type 2 diabetes mellitus with diabetic peripheral angiopathy without gangrene: Secondary | ICD-10-CM | POA: Diagnosis present

## 2023-09-11 HISTORY — PX: LOWER EXTREMITY INTERVENTION: CATH118252

## 2023-09-11 HISTORY — PX: LOWER EXTREMITY ANGIOGRAPHY: CATH118251

## 2023-09-11 LAB — BUN: BUN: 11 mg/dL (ref 8–23)

## 2023-09-11 LAB — CREATININE, SERUM
Creatinine, Ser: 1.35 mg/dL — ABNORMAL HIGH (ref 0.61–1.24)
GFR, Estimated: 55 mL/min — ABNORMAL LOW (ref >60)

## 2023-09-11 LAB — GLUCOSE, CAPILLARY: Glucose-Capillary: 95 mg/dL (ref 70–99)

## 2023-09-11 SURGERY — LOWER EXTREMITY INTERVENTION
Anesthesia: Moderate Sedation | Site: Leg Lower | Laterality: Right

## 2023-09-11 MED ORDER — MIDAZOLAM HCL 2 MG/2ML IJ SOLN
INTRAMUSCULAR | Status: DC | PRN
  Administered 2023-09-11 (×3): 1 mg via INTRAVENOUS
  Administered 2023-09-11: .5 mg via INTRAVENOUS
  Administered 2023-09-11: 2 mg via INTRAVENOUS

## 2023-09-11 MED ORDER — FENTANYL CITRATE (PF) 100 MCG/2ML IJ SOLN
INTRAMUSCULAR | Status: DC | PRN
  Administered 2023-09-11 (×3): 50 ug via INTRAVENOUS

## 2023-09-11 MED ORDER — CLOPIDOGREL BISULFATE 300 MG PO TABS
300.0000 mg | ORAL_TABLET | ORAL | Status: AC
  Administered 2023-09-11: 300 mg via ORAL

## 2023-09-11 MED ORDER — MIDAZOLAM HCL 2 MG/2ML IJ SOLN
INTRAMUSCULAR | Status: AC
  Filled 2023-09-11: qty 2

## 2023-09-11 MED ORDER — SODIUM CHLORIDE 0.9 % IV SOLN: 250.0000 mL | INTRAVENOUS | Status: DC | PRN

## 2023-09-11 MED ORDER — ONDANSETRON HCL 4 MG/2ML IJ SOLN: 4.0000 mg | Freq: Four times a day (QID) | INTRAMUSCULAR | Status: DC | PRN

## 2023-09-11 MED ORDER — SODIUM CHLORIDE 0.9 % IV SOLN: INTRAVENOUS | Status: DC

## 2023-09-11 MED ORDER — CEFAZOLIN SODIUM-DEXTROSE 2-4 GM/100ML-% IV SOLN
INTRAVENOUS | Status: AC
  Filled 2023-09-11: qty 100

## 2023-09-11 MED ORDER — SODIUM CHLORIDE 0.9% FLUSH: 3.0000 mL | INTRAVENOUS | Status: DC | PRN

## 2023-09-11 MED ORDER — FAMOTIDINE 20 MG PO TABS
40.0000 mg | ORAL_TABLET | Freq: Once | ORAL | Status: DC | PRN
Start: 2023-09-11 — End: 2023-09-11

## 2023-09-11 MED ORDER — NITROGLYCERIN 1 MG/10 ML FOR IR/CATH LAB
INTRA_ARTERIAL | Status: DC | PRN
  Administered 2023-09-11: 200 ug via INTRA_ARTERIAL
  Administered 2023-09-11: 300 ug via INTRA_ARTERIAL

## 2023-09-11 MED ORDER — MIDAZOLAM HCL 2 MG/ML PO SYRP: 8.0000 mg | ORAL_SOLUTION | Freq: Once | ORAL | Status: DC | PRN

## 2023-09-11 MED ORDER — HEPARIN (PORCINE) IN NACL 1000-0.9 UT/500ML-% IV SOLN
INTRAVENOUS | Status: DC | PRN
  Administered 2023-09-11: 1000 mL

## 2023-09-11 MED ORDER — IODIXANOL 320 MG/ML IV SOLN
INTRAVENOUS | Status: DC | PRN
  Administered 2023-09-11: 85 mL

## 2023-09-11 MED ORDER — NITROGLYCERIN 1 MG/10 ML FOR IR/CATH LAB
INTRA_ARTERIAL | Status: AC
  Filled 2023-09-11: qty 10

## 2023-09-11 MED ORDER — LABETALOL HCL 5 MG/ML IV SOLN: 10.0000 mg | INTRAVENOUS | Status: DC | PRN

## 2023-09-11 MED ORDER — HYDROMORPHONE HCL 1 MG/ML IJ SOLN: 1.0000 mg | Freq: Once | INTRAMUSCULAR | Status: DC | PRN

## 2023-09-11 MED ORDER — FENTANYL CITRATE PF 50 MCG/ML IJ SOSY
PREFILLED_SYRINGE | INTRAMUSCULAR | Status: AC
Start: 2023-09-11 — End: ?
  Filled 2023-09-11: qty 1

## 2023-09-11 MED ORDER — FENTANYL CITRATE PF 50 MCG/ML IJ SOSY
PREFILLED_SYRINGE | INTRAMUSCULAR | Status: AC
  Filled 2023-09-11: qty 1

## 2023-09-11 MED ORDER — LIDOCAINE-EPINEPHRINE (PF) 1 %-1:200000 IJ SOLN
INTRAMUSCULAR | Status: DC | PRN
  Administered 2023-09-11: 10 mL

## 2023-09-11 MED ORDER — CEFAZOLIN SODIUM-DEXTROSE 2-4 GM/100ML-% IV SOLN
2.0000 g | INTRAVENOUS | Status: AC
  Administered 2023-09-11: 2 g via INTRAVENOUS

## 2023-09-11 MED ORDER — HEPARIN SODIUM (PORCINE) 1000 UNIT/ML IJ SOLN
INTRAMUSCULAR | Status: DC | PRN
  Administered 2023-09-11: 6000 [IU] via INTRAVENOUS

## 2023-09-11 MED ORDER — CLOPIDOGREL BISULFATE 75 MG PO TABS
ORAL_TABLET | ORAL | Status: AC
  Filled 2023-09-11: qty 4

## 2023-09-11 MED ORDER — ACETAMINOPHEN 325 MG PO TABS: 650.0000 mg | ORAL_TABLET | ORAL | Status: DC | PRN

## 2023-09-11 MED ORDER — SODIUM CHLORIDE 0.9% FLUSH: 3.0000 mL | Freq: Two times a day (BID) | INTRAVENOUS | Status: DC

## 2023-09-11 MED ORDER — HYDRALAZINE HCL 20 MG/ML IJ SOLN: 5.0000 mg | INTRAMUSCULAR | Status: DC | PRN

## 2023-09-11 MED ORDER — DIPHENHYDRAMINE HCL 50 MG/ML IJ SOLN
50.0000 mg | Freq: Once | INTRAMUSCULAR | Status: DC | PRN
Start: 2023-09-11 — End: 2023-09-11

## 2023-09-11 MED ORDER — METHYLPREDNISOLONE SODIUM SUCC 125 MG IJ SOLR: 125.0000 mg | Freq: Once | INTRAMUSCULAR | Status: DC | PRN

## 2023-09-11 SURGICAL SUPPLY — 30 items
BALLOON JADE .0142.5 X 40 (BALLOONS) IMPLANT
BALLOON LUTONIX 018 5X40X130 (BALLOONS) IMPLANT
BALLOON LUTONIX 018 5X60X130 (BALLOONS) IMPLANT
BALLOON ULTRSC 014 2.5X100X150 (BALLOONS) IMPLANT
BALLOON ULTRSC 014 2.5X150X150 (BALLOONS) IMPLANT
BALLOON ULTRSCRE 014 3X40X150 (BALLOONS) IMPLANT
CATH PIG 70CM (CATHETERS) IMPLANT
CATH TEMPO 5F RIM 65CM (CATHETERS) IMPLANT
CATH VERT 5FR 125CM (CATHETERS) IMPLANT
COVER PROBE ULTRASOUND 5X96 (MISCELLANEOUS) IMPLANT
DEVICE PRESTO INFLATION (MISCELLANEOUS) IMPLANT
DEVICE STARCLOSE SE CLOSURE (Vascular Products) IMPLANT
DEVICE TORQUE (MISCELLANEOUS) IMPLANT
GLIDECATH 4FR STR (CATHETERS) IMPLANT
GLIDECATH ANGL TAPER 5F (CATHETERS) IMPLANT
GLIDEWIRE ADV .035X260CM (WIRE) IMPLANT
GOWN STRL REUS W/ TWL LRG LVL3 (GOWN DISPOSABLE) ×1 IMPLANT
GUIDEWIRE ANGLED .035 180CM (WIRE) IMPLANT
NDL ENTRY 21GA 7CM ECHOTIP (NEEDLE) IMPLANT
NEEDLE ENTRY 21GA 7CM ECHOTIP (NEEDLE) ×1 IMPLANT
PACK ANGIOGRAPHY (CUSTOM PROCEDURE TRAY) ×1 IMPLANT
SET INTRO CAPELLA COAXIAL (SET/KITS/TRAYS/PACK) IMPLANT
SHEATH ANL2 6FRX45 HC (SHEATH) IMPLANT
SHEATH BRITE TIP 5FRX11 (SHEATH) IMPLANT
SHEATH NEURON MAX 6FR 80CM (SHEATH) IMPLANT
STENT ESPRIT BTK 2.5X38 SCAFF (Permanent Stent) IMPLANT
SYR MEDRAD MARK 7 150ML (SYRINGE) IMPLANT
TUBING CONTRAST HIGH PRESS 72 (TUBING) IMPLANT
WIRE COMMAND ST STR 014 300 (WIRE) IMPLANT
WIRE J 3MM .035X145CM (WIRE) IMPLANT

## 2023-09-11 NOTE — Discharge Instructions (Signed)

## 2023-09-11 NOTE — Op Note (Signed)
 [facility]  Percutaneous Study/Intervention Procedural Note   Date of Surgery: 09/11/2023  Surgeon:  Jackquelyn Mass, MD.  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  Introduction catheter into right lower extremity 3rd order catheter placement with additional third order catheter placement             2.    Contrast injection right lower extremity for distal runoff             3.  Percutaneous transluminal angioplasty right mid popliteal artery.              4.  Percutaneous transluminal angioplasty and stent placement right anterior tibial artery.                5.  Percutaneous transluminal angioplasty right tibioperoneal trunk.                6.   Star close closure left common femoral arteriotomy  Anesthesia: Conscious sedation was administered under my direct supervision by the interventional radiology RN. IV Versed  plus fentanyl  were utilized. Continuous ECG, pulse oximetry and blood pressure was monitored throughout the entire procedure.  Conscious sedation was for a total of 1 hour 53 minutes and 25 seconds.  Sheath: 6 French 80 cm neuron sheath left common femoral retrograde  Contrast: 85 cc  Fluoroscopy Time: 16.6 minutes  Indications:  Andrew Harding presents with an ulceration of the plantar surface of the right foot overlying the metatarsal head.  He has known atherosclerotic occlusive disease.  This places him at high risk for limb loss.  Angiography with the hope for intervention is recommended.  The risks and benefits are reviewed all questions answered patient agrees to proceed.  Procedure:  Andrew Harding is a 73 y.o. y.o. male who was identified and appropriate procedural time out was performed.  The patient was then placed supine on the table and prepped and draped in the usual  sterile fashion.    Ultrasound was placed in the sterile sleeve and the left groin was evaluated the left common femoral artery was echolucent and pulsatile indicating patency.  Image was recorded for the permanent record and under real-time visualization a microneedle was inserted into the common femoral artery microwire followed by a micro-sheath.  A J-wire was then advanced through the micro-sheath and a  5 [place] sheath was then inserted over a J-wire. J-wire was then advanced and a 5 French pigtail catheter was positioned at the level of T12. AP projection of the aorta was then obtained. Pigtail catheter was repositioned to above the bifurcation and a LAO view of the pelvis was obtained.  Subsequently a rim catheter with the floppy angle Glidewire was used to cross the aortic bifurcation, the then a 4 French straight glide catheter was advanced over the floppy glide wire down into the SFA.  With the catheter hubbed at the sheath an advantage wire was then advanced up and over and down into the right SFA.  Next a 6 [place] was advanced and exchanged for the 5 [place] catheter up and over the aortic bifurcation and positioned with its tip in the common femoral artery. Oblique view of the femoral bifurcation was then obtained and subsequently the wire was reintroduced and the pigtail catheter negotiated into the SFA representing  third order catheter placement. Distal runoff was then performed.  6000 units of heparin  was then given and allowed to circulate and a 6 [place] neuron 80 cm sheath was advanced up and over the bifurcation and positioned in the popliteal artery.  KMP  catheter and advantage Glidewire were then negotiated down into the mid posterior tibial artery.  A 5 mm x 40 mm Lutonix drug-eluting balloon was advanced across the mid popliteal stenosis inflation was to 10 atm for 1 minute.  Follow-up imaging demonstrated less than 10% residual stenosis.  The detector was then repositioned more  distally and imaging was obtained of the tibioperoneal trunk.  The advantage wire was exchanged for a 0.014 command wire which was then reintroduced and a 3 mm x 40 mm ultra score balloon was used to angioplasty the tibioperoneal trunk.  Angioplasty was to 10 atm for 1 minute.  Follow-up imaging demonstrated greater than 50% residual stenosis and a 5 mm x 60 mm Lutonix drug-eluting balloon was advanced across this lesion and inflated to 10 atm for 1 minute.  Follow-up imaging demonstrated less than 10% residual stenosis.  Kumpe catheter was then advanced over the command wire and the wire catheter were negotiated into the anterior tibial.  Using the wire catheter combination the anterior tibial artery occlusion was crossed.  A 2.5 mm x 200 mm ultra score balloon was then advanced across the proximal area of stenosis and inflated to 10 atm for 2 minutes.  Follow-up imaging demonstrated persistent occlusive disease more distally and a 2.5 mm x 100 mm balloon was advanced across this area inflated to 10 atm for 2 minutes.  Follow-up imaging demonstrated greater than 70% residual stenosis in the area that was just treated.  A total of 400 mcg of nitroglycerin was administered intra-arterially however this did not improve the appearance of this mid anterior tibial lesion.  Given the suboptimal result I elected to place a 2.5 mm x 38 mm Esprit stent.  After deployment follow-up imaging demonstrated that another 25 mm of coverage was needed and a second 2.5 mm x 38 mm Esprit stent was deployed.  Deployments were to 10 to 12 atm for the 30 seconds.  Follow-up angioplasty per protocol used a 40 mm x 2.5 mm Jade balloon.  3 separate inflations were performed each of these to 14 to 16 atm for 15 to 30 seconds.  Follow-up imaging through a Kumpe catheter fitted with a Touhy Borst demonstrated wide patency of the anterior tibial with preservation of the distal outflow to the pedal arch.  After review of these images the  sheath is pulled into the left external iliac oblique of the common femoral is obtained and a Star close device deployed. There no immediate complications.   Findings:  The abdominal aorta is opacified with a bolus injection contrast. Renal arteries are single and widely patent. The aorta itself has diffuse disease but no hemodynamically significant lesions. The common and external iliac arteries are widely patent bilaterally.  The right common femoral is widely patent as is the profunda femoris.  The SFA is mildly diseased throughout but there are no hemodynamically significant stenoses.  The mid popliteal does indeed have a significant stenosis of approximately 70% at the level of the tibial plateau.  The distal popliteal is patent.  Trifurcation is heavily diseased with a greater than 80% stenosis of the tibioperoneal trunk the peroneal is patent throughout its course but somewhat small.  The posterior tibial occludes just above the ankle.  The anterior tibial demonstrates moderate 50 to 60% stenosis at its ostia and then a long segment of greater than 70% stenosis in its proximal one third with an occlusion in its midportion.  Distal to the occlusion the artery appears to be widely patent filling the dorsalis pedis and the pedal arch.  Following angioplasty the mid popliteal is now patent with in-line flow and looks quite nice there is less than 10% residual stenosis.  Following angioplasty to 5 mm the tibioperoneal trunk is now widely patent and there is improved filling of the peroneal.  Posterior tibial artery remains occluded from just above the ankle distally.  Following angioplasty and stent placement within the anterior tibial there is now wide patency with less than 10% residual stenosis and direct filling of the dorsalis pedis and pedal arch.    Summary: Successful recanalization right lower extremity for limb salvage                        Disposition: Patient was taken to the  recovery room in stable condition having tolerated the procedure well.  Jackquelyn Mass 09/11/2023,3:05 PM

## 2023-09-12 ENCOUNTER — Encounter: Payer: Self-pay | Admitting: Vascular Surgery

## 2023-09-12 ENCOUNTER — Encounter: Admitting: Physical Therapy

## 2023-09-12 NOTE — Telephone Encounter (Signed)
 Called patient this morning and spoke directly with him.  He will stop his Eliquis  today but continue Plavix .  There will be no Eliquis  Wednesday Thursday or the morning of surgery but Plavix  will continue 75 mg on a daily basis.

## 2023-09-14 ENCOUNTER — Ambulatory Visit: Admitting: Anesthesiology

## 2023-09-14 ENCOUNTER — Encounter: Payer: Self-pay | Admitting: Podiatry

## 2023-09-14 ENCOUNTER — Ambulatory Visit
Admission: RE | Admit: 2023-09-14 | Discharge: 2023-09-14 | Disposition: A | Discharge status: Home / Self Care | Attending: Podiatry | Admitting: Podiatry

## 2023-09-14 ENCOUNTER — Other Ambulatory Visit: Payer: Self-pay

## 2023-09-14 ENCOUNTER — Encounter
Admission: RE | Disposition: A | Discharge status: Home / Self Care | Payer: Self-pay | Source: Home / Self Care | Attending: Podiatry

## 2023-09-14 ENCOUNTER — Ambulatory Visit

## 2023-09-14 DIAGNOSIS — Z87891 Personal history of nicotine dependence: Secondary | ICD-10-CM | POA: Diagnosis not present

## 2023-09-14 DIAGNOSIS — I2510 Atherosclerotic heart disease of native coronary artery without angina pectoris: Secondary | ICD-10-CM | POA: Diagnosis not present

## 2023-09-14 DIAGNOSIS — Z951 Presence of aortocoronary bypass graft: Secondary | ICD-10-CM | POA: Diagnosis not present

## 2023-09-14 DIAGNOSIS — Z794 Long term (current) use of insulin: Secondary | ICD-10-CM | POA: Insufficient documentation

## 2023-09-14 DIAGNOSIS — E1142 Type 2 diabetes mellitus with diabetic polyneuropathy: Secondary | ICD-10-CM

## 2023-09-14 DIAGNOSIS — E11621 Type 2 diabetes mellitus with foot ulcer: Secondary | ICD-10-CM | POA: Diagnosis present

## 2023-09-14 DIAGNOSIS — I10 Essential (primary) hypertension: Secondary | ICD-10-CM | POA: Insufficient documentation

## 2023-09-14 DIAGNOSIS — Z7985 Long-term (current) use of injectable non-insulin antidiabetic drugs: Secondary | ICD-10-CM | POA: Insufficient documentation

## 2023-09-14 DIAGNOSIS — M62461 Contracture of muscle, right lower leg: Secondary | ICD-10-CM | POA: Insufficient documentation

## 2023-09-14 DIAGNOSIS — E1140 Type 2 diabetes mellitus with diabetic neuropathy, unspecified: Secondary | ICD-10-CM

## 2023-09-14 DIAGNOSIS — Z01812 Encounter for preprocedural laboratory examination: Secondary | ICD-10-CM

## 2023-09-14 DIAGNOSIS — I4891 Unspecified atrial fibrillation: Secondary | ICD-10-CM | POA: Insufficient documentation

## 2023-09-14 DIAGNOSIS — M86172 Other acute osteomyelitis, left ankle and foot: Secondary | ICD-10-CM

## 2023-09-14 DIAGNOSIS — M86671 Other chronic osteomyelitis, right ankle and foot: Secondary | ICD-10-CM | POA: Insufficient documentation

## 2023-09-14 HISTORY — PX: AMPUTATION: SHX166

## 2023-09-14 HISTORY — PX: ACHILLES TENDON SURGERY: SHX542

## 2023-09-14 LAB — GLUCOSE, CAPILLARY
Glucose-Capillary: 122 mg/dL — ABNORMAL HIGH (ref 70–99)
Glucose-Capillary: 168 mg/dL — ABNORMAL HIGH (ref 70–99)

## 2023-09-14 LAB — TYPE AND SCREEN
ABO/RH(D): AB POS
Antibody Screen: NEGATIVE

## 2023-09-14 SURGERY — AMPUTATION, FOOT, RAY
Anesthesia: General | Site: First Toe | Laterality: Right

## 2023-09-14 MED ORDER — CEFAZOLIN SODIUM-DEXTROSE 2-4 GM/100ML-% IV SOLN
2.0000 g | INTRAVENOUS | Status: AC
  Administered 2023-09-14: 2 g via INTRAVENOUS

## 2023-09-14 MED ORDER — CHLORHEXIDINE GLUCONATE 4 % EX SOLN: 1.0000 | CUTANEOUS | 1 refills | Status: DC

## 2023-09-14 MED ORDER — BUPIVACAINE HCL (PF) 0.5 % IJ SOLN
INTRAMUSCULAR | Status: DC | PRN
  Administered 2023-09-14 (×2): 10 mL

## 2023-09-14 MED ORDER — PROPOFOL 10 MG/ML IV BOLUS
INTRAVENOUS | Status: DC | PRN
  Administered 2023-09-14: 130 mg via INTRAVENOUS

## 2023-09-14 MED ORDER — DEXAMETHASONE SODIUM PHOSPHATE 10 MG/ML IJ SOLN
INTRAMUSCULAR | Status: DC | PRN
  Administered 2023-09-14: 6 mg via INTRAVENOUS

## 2023-09-14 MED ORDER — OXYCODONE HCL 5 MG/5ML PO SOLN: 5.0000 mg | Freq: Once | ORAL | Status: DC | PRN

## 2023-09-14 MED ORDER — ORAL CARE MOUTH RINSE: 15.0000 mL | Freq: Once | OROMUCOSAL | Status: AC

## 2023-09-14 MED ORDER — MIDAZOLAM HCL 2 MG/2ML IJ SOLN
INTRAMUSCULAR | Status: DC | PRN
  Administered 2023-09-14 (×2): 1 mg via INTRAVENOUS

## 2023-09-14 MED ORDER — ONDANSETRON HCL 4 MG/2ML IJ SOLN
INTRAMUSCULAR | Status: DC | PRN
Start: 2023-09-14 — End: 2023-09-14
  Administered 2023-09-14: 4 mg via INTRAVENOUS

## 2023-09-14 MED ORDER — 0.9 % SODIUM CHLORIDE (POUR BTL) OPTIME
TOPICAL | Status: DC | PRN
  Administered 2023-09-14: 500 mL

## 2023-09-14 MED ORDER — BUPIVACAINE HCL (PF) 0.5 % IJ SOLN
INTRAMUSCULAR | Status: AC
  Filled 2023-09-14: qty 30

## 2023-09-14 MED ORDER — ONDANSETRON HCL 4 MG/2ML IJ SOLN: 4.0000 mg | Freq: Once | INTRAMUSCULAR | Status: DC | PRN

## 2023-09-14 MED ORDER — CEFAZOLIN SODIUM-DEXTROSE 2-4 GM/100ML-% IV SOLN
INTRAVENOUS | Status: AC
  Filled 2023-09-14: qty 100

## 2023-09-14 MED ORDER — PHENYLEPHRINE 80 MCG/ML (10ML) SYRINGE FOR IV PUSH (FOR BLOOD PRESSURE SUPPORT)
PREFILLED_SYRINGE | INTRAVENOUS | Status: DC | PRN
  Administered 2023-09-14 (×2): 80 ug via INTRAVENOUS

## 2023-09-14 MED ORDER — VASOPRESSIN 20 UNIT/ML IV SOLN
INTRAVENOUS | Status: DC | PRN
  Administered 2023-09-14 (×4): 1 [IU] via INTRAVENOUS

## 2023-09-14 MED ORDER — OXYCODONE-ACETAMINOPHEN 5-325 MG PO TABS: 1.0000 | ORAL_TABLET | Freq: Four times a day (QID) | ORAL | 0 refills | Status: DC | PRN

## 2023-09-14 MED ORDER — PROPOFOL 1000 MG/100ML IV EMUL
INTRAVENOUS | Status: AC
  Filled 2023-09-14: qty 100

## 2023-09-14 MED ORDER — PHENYLEPHRINE 80 MCG/ML (10ML) SYRINGE FOR IV PUSH (FOR BLOOD PRESSURE SUPPORT)
PREFILLED_SYRINGE | INTRAVENOUS | Status: AC
  Filled 2023-09-14: qty 10

## 2023-09-14 MED ORDER — OXYCODONE HCL 5 MG PO TABS: 5.0000 mg | ORAL_TABLET | Freq: Once | ORAL | Status: DC | PRN

## 2023-09-14 MED ORDER — EPHEDRINE SULFATE-NACL 50-0.9 MG/10ML-% IV SOSY
PREFILLED_SYRINGE | INTRAVENOUS | Status: DC | PRN
  Administered 2023-09-14 (×2): 5 mg via INTRAVENOUS

## 2023-09-14 MED ORDER — MUPIROCIN 2 % EX OINT: 1.0000 | TOPICAL_OINTMENT | Freq: Two times a day (BID) | CUTANEOUS | 0 refills | Status: AC

## 2023-09-14 MED ORDER — DEXAMETHASONE SODIUM PHOSPHATE 10 MG/ML IJ SOLN
INTRAMUSCULAR | Status: AC
  Filled 2023-09-14: qty 1

## 2023-09-14 MED ORDER — PROPOFOL 10 MG/ML IV BOLUS
INTRAVENOUS | Status: AC
  Filled 2023-09-14: qty 20

## 2023-09-14 MED ORDER — MIDAZOLAM HCL 2 MG/2ML IJ SOLN
INTRAMUSCULAR | Status: AC
  Filled 2023-09-14: qty 2

## 2023-09-14 MED ORDER — ACETAMINOPHEN 10 MG/ML IV SOLN: 1000.0000 mg | Freq: Once | INTRAVENOUS | Status: DC | PRN

## 2023-09-14 MED ORDER — LIDOCAINE HCL 1 % IJ SOLN
INTRAMUSCULAR | Status: DC | PRN
  Administered 2023-09-14: 10 mL

## 2023-09-14 MED ORDER — FENTANYL CITRATE (PF) 100 MCG/2ML IJ SOLN
INTRAMUSCULAR | Status: DC | PRN
Start: 2023-09-14 — End: 2023-09-14
  Administered 2023-09-14 (×2): 25 ug via INTRAVENOUS

## 2023-09-14 MED ORDER — LIDOCAINE HCL (PF) 1 % IJ SOLN
INTRAMUSCULAR | Status: AC
  Filled 2023-09-14: qty 30

## 2023-09-14 MED ORDER — CHLORHEXIDINE GLUCONATE 0.12 % MT SOLN
OROMUCOSAL | Status: AC
  Filled 2023-09-14: qty 15

## 2023-09-14 MED ORDER — LIDOCAINE HCL (CARDIAC) PF 100 MG/5ML IV SOSY
PREFILLED_SYRINGE | INTRAVENOUS | Status: DC | PRN
  Administered 2023-09-14: 60 mg via INTRAVENOUS

## 2023-09-14 MED ORDER — ONDANSETRON HCL 4 MG/2ML IJ SOLN
INTRAMUSCULAR | Status: AC
  Filled 2023-09-14: qty 2

## 2023-09-14 MED ORDER — FENTANYL CITRATE (PF) 100 MCG/2ML IJ SOLN: 25.0000 ug | INTRAMUSCULAR | Status: DC | PRN

## 2023-09-14 MED ORDER — SODIUM CHLORIDE 0.9 % IV SOLN: INTRAVENOUS | Status: DC

## 2023-09-14 MED ORDER — SULFAMETHOXAZOLE-TRIMETHOPRIM 800-160 MG PO TABS: 1.0000 | ORAL_TABLET | Freq: Two times a day (BID) | ORAL | 0 refills | Status: AC

## 2023-09-14 MED ORDER — FENTANYL CITRATE (PF) 100 MCG/2ML IJ SOLN
INTRAMUSCULAR | Status: AC
Start: 2023-09-14 — End: ?
  Filled 2023-09-14: qty 2

## 2023-09-14 MED ORDER — CHLORHEXIDINE GLUCONATE 0.12 % MT SOLN
15.0000 mL | Freq: Once | OROMUCOSAL | Status: AC
  Administered 2023-09-14: 15 mL via OROMUCOSAL

## 2023-09-14 MED ORDER — EPHEDRINE 5 MG/ML INJ
INTRAVENOUS | Status: AC
Start: 2023-09-14 — End: ?
  Filled 2023-09-14: qty 5

## 2023-09-14 MED ORDER — CEFUROXIME AXETIL 500 MG PO TABS: 500.0000 mg | ORAL_TABLET | Freq: Two times a day (BID) | ORAL | 0 refills | Status: AC

## 2023-09-14 SURGICAL SUPPLY — 44 items
BAG COUNTER SPONGE SURGICOUNT (BAG) IMPLANT
BLADE MED AGGRESSIVE (BLADE) IMPLANT
BLADE OSC/SAGITTAL 5.5X25 (BLADE) ×2 IMPLANT
BLADE SURG 15 STRL LF DISP TIS (BLADE) IMPLANT
BNDG COHESIVE 4X5 TAN STRL LF (GAUZE/BANDAGES/DRESSINGS) ×2 IMPLANT
BNDG ELASTIC 4X5.8 VLCR NS LF (GAUZE/BANDAGES/DRESSINGS) ×2 IMPLANT
BNDG ESMARCH 4X12 STRL LF (GAUZE/BANDAGES/DRESSINGS) ×2 IMPLANT
BNDG GAUZE DERMACEA FLUFF 4 (GAUZE/BANDAGES/DRESSINGS) ×2 IMPLANT
BNDG STRETCH 4X75 STRL LF (GAUZE/BANDAGES/DRESSINGS) ×2 IMPLANT
BOOT STEPPER DURA LG (SOFTGOODS) IMPLANT
CNTNR URN SCR LID CUP LEK RST (MISCELLANEOUS) ×2 IMPLANT
CUFF TOURN SGL QUICK 12 (TOURNIQUET CUFF) IMPLANT
CUFF TOURN SGL QUICK 18X4 (TOURNIQUET CUFF) IMPLANT
DRAIN PENROSE 12X.25 LTX STRL (MISCELLANEOUS) IMPLANT
DRAPE FLUOR MINI C-ARM 54X84 (DRAPES) IMPLANT
DURAPREP 26ML APPLICATOR (WOUND CARE) ×2 IMPLANT
ELECTRODE REM PT RTRN 9FT ADLT (ELECTROSURGICAL) ×2 IMPLANT
GAUZE SPONGE 4X4 12PLY STRL (GAUZE/BANDAGES/DRESSINGS) ×4 IMPLANT
GAUZE XEROFORM 1X8 LF (GAUZE/BANDAGES/DRESSINGS) ×2 IMPLANT
GLOVE BIO SURGEON STRL SZ7.5 (GLOVE) ×2 IMPLANT
GLOVE INDICATOR 8.0 STRL GRN (GLOVE) ×2 IMPLANT
GOWN STRL REUS W/ TWL XL LVL3 (GOWN DISPOSABLE) ×2 IMPLANT
GOWN STRL REUS W/TWL XL LVL4 (GOWN DISPOSABLE) ×2 IMPLANT
HANDLE YANKAUER SUCT BULB TIP (MISCELLANEOUS) IMPLANT
HANDPIECE VERSAJET DEBRIDEMENT (MISCELLANEOUS) IMPLANT
KIT TURNOVER KIT A (KITS) ×2 IMPLANT
LABEL OR SOLS (LABEL) IMPLANT
MANIFOLD NEPTUNE II (INSTRUMENTS) ×2 IMPLANT
NDL HYPO 25X1 1.5 SAFETY (NEEDLE) ×2 IMPLANT
NDL SAFETY ECLIPSE 18X1.5 (NEEDLE) ×2 IMPLANT
NEEDLE HYPO 25X1 1.5 SAFETY (NEEDLE) ×4 IMPLANT
NS IRRIG 500ML POUR BTL (IV SOLUTION) ×2 IMPLANT
PACK EXTREMITY ARMC (MISCELLANEOUS) ×2 IMPLANT
PAD ABD DERMACEA PRESS 5X9 (GAUZE/BANDAGES/DRESSINGS) ×4 IMPLANT
PENCIL SMOKE EVACUATOR (MISCELLANEOUS) ×2 IMPLANT
SOLUTION PREP PVP 2OZ (MISCELLANEOUS) ×2 IMPLANT
SPONGE T-LAP 18X18 ~~LOC~~+RFID (SPONGE) ×2 IMPLANT
STOCKINETTE M/LG 89821 (MISCELLANEOUS) ×2 IMPLANT
SUT VIC AB 3-0 SH 27X BRD (SUTURE) IMPLANT
SUTURE EHLN 3-0 FS-10 30 BLK (SUTURE) ×2 IMPLANT
SWAB CULTURE AMIES ANAERIB BLU (MISCELLANEOUS) IMPLANT
SYR 10ML LL (SYRINGE) ×4 IMPLANT
TRAP FLUID SMOKE EVACUATOR (MISCELLANEOUS) ×2 IMPLANT
WATER STERILE IRR 500ML POUR (IV SOLUTION) ×2 IMPLANT

## 2023-09-14 NOTE — Transfer of Care (Signed)
 Immediate Anesthesia Transfer of Care Note  [name]  Procedure(s) Performed: AMPUTATION, FOOT, RAY (Right: First Toe) TENOTOMY, ACHILLES (Right)  Patient Location: PACU  Anesthesia Type:General  Level of Consciousness: drowsy and patient cooperative  Airway & Oxygen Therapy: Patient Spontanous Breathing and Patient connected to face mask oxygen  Post-op Assessment: Report given to RN and Post -op Vital signs reviewed and stable  Post vital signs: stable  Last Vitals:  Vitals Value Taken Time  BP 114/71 09/14/23 0854  Temp    Pulse 81 09/14/23 0857  Resp 8 09/14/23 0857  SpO2 98 % 09/14/23 0857  Vitals shown include unfiled device data.  Last Pain:  Vitals:   09/14/23 0617  PainSc: 0-No pain         Complications: No notable events documented.

## 2023-09-14 NOTE — Anesthesia Preprocedure Evaluation (Signed)
 Anesthesia Evaluation  Patient identified by MRN, date of birth, ID band Patient awake    Reviewed: Allergy & Precautions, NPO status , Patient's Chart, lab work & pertinent test results  History of Anesthesia Complications Negative for: history of anesthetic complications  Airway Mallampati: II  TM Distance: >3 FB Neck ROM: full    Dental no notable dental hx. (+) Teeth Intact   Pulmonary neg sleep apnea, neg COPD, Patient abstained from smoking.Not current smoker, former smoker   Pulmonary exam normal breath sounds clear to auscultation       Cardiovascular Exercise Tolerance: Good METShypertension, Pt. on medications + CAD, + CABG and + Peripheral Vascular Disease  (-) Past MI + dysrhythmias Atrial Fibrillation  Rhythm:Irregular Rate:Normal - Systolic murmurs TTE 2024:   1. Left ventricular ejection fraction, by estimation, is 55 to 60%. The  left ventricle has normal function.   2. Right ventricular systolic function is normal. The right ventricular  size is normal.   3. Left atrial size was dilated. No left atrial/left atrial appendage  thrombus was detected.   4. Right atrial size was dilated.   5. The mitral valve is normal in structure. Mild mitral valve  regurgitation.   6. The aortic valve is tricuspid. There is mild calcification of the  aortic valve. There is mild thickening of the aortic valve. Aortic valve  regurgitation is trivial.     Neuro/Psych  Neuromuscular disease  negative psych ROS   GI/Hepatic negative GI ROS, Neg liver ROS,neg GERD  ,,  Endo/Other  diabetes, Type 2, Insulin  Dependent  GLP1 agonist held for "at least 7 days". Denies Gi symptoms.  Renal/GU Renal InsufficiencyRenal disease  negative genitourinary   Musculoskeletal negative musculoskeletal ROS (+)    Abdominal Normal abdominal exam  (+)   Peds negative pediatric ROS (+)  Hematology  (+) Blood dyscrasia, anemia    Anesthesia Other Findings - Sepsis (HCC) - Open wound of left foot with complication - Gram-negative bacteremia   Past Medical History: No date: Bladder neck obstruction No date: Chronic kidney disease No date: Coronary artery disease     Comment:  a.) s/p 4v CABG in 2014 No date: Diabetes mellitus without complication (HCC) No date: Diabetic neuropathy (HCC) No date: Diabetic peripheral neuropathy (HCC) No date: Diverticulosis No date: Gout No date: Heart murmur No date: Hypercholesteremia No date: Hyperlipidemia No date: Hypertension No date: Peripheral neuropathy 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 No date: Tubular adenoma No date: Vitamin D  deficiency  Past Surgical History: 08/19/2022: AMPUTATION; Left     Comment:  Procedure: TRANSMETATARSAL AMPUTATION LEFT FOOT WITH               IRRIGATION AND DEBRIDEMENT;  Surgeon: Pink Bridges, DPM;              Location: [facility];  Service: Podiatry;  Laterality:               Left; 06/01/2015: AMPUTATION TOE; Right     Comment:  Procedure: AMPUTATION TOE;  Surgeon: Sharlyn Deaner,               DPM;  Location: [facility];  Service: Podiatry;                Laterality: Right; 08/11/2022: AMPUTATION TOE; Left     Comment:  Procedure: AMPUTATION TOE 2, 3, 4;  Surgeon: Anell Baptist, DPM;  Location: [facility];  Service: Podiatry;                Laterality: Left; No date: CATARACT EXTRACTION, BILATERAL 06/12/2022: CIRCUMCISION; N/A     Comment:  Procedure: CIRCUMCISION ADULT;  Surgeon: Dustin Gimenez, MD;  Location: [facility];  Service: Urology;                Laterality: N/A; 02/14/2016: COLONOSCOPY WITH PROPOFOL ; N/A     Comment:  Procedure: COLONOSCOPY WITH PROPOFOL ;  Surgeon: Deveron Fly, MD;  Location: [facility] ENDOSCOPY;  Service:               Endoscopy;  Laterality: N/A; 01/07/2019: COLONOSCOPY WITH PROPOFOL ; N/A     Comment:  Procedure: COLONOSCOPY WITH PROPOFOL ;  Surgeon:                Deveron Fly, MD;  Location: [facility] ENDOSCOPY;                Service: Endoscopy;  Laterality: N/A; 08/2012: CORONARY ARTERY BYPASS GRAFT; N/A 06/01/2015: EXCISION PARTIAL PHALANX; Right     Comment:  Procedure: EXCISION PARTIAL PHALANX /  BONE;  Surgeon:               Sharlyn Deaner, DPM;  Location: [facility];  Service:               Podiatry;  Laterality: Right; 05/29/2016: FLEXIBLE SIGMOIDOSCOPY; N/A     Comment:  Procedure: FLEXIBLE SIGMOIDOSCOPY;  Surgeon: Deveron Fly, MD;  Location: [facility] ENDOSCOPY;  Service:               Endoscopy;  Laterality: N/A; 08/23/2022: INCISION AND DRAINAGE; Left     Comment:  Procedure: INCISION AND DRAINAGE;  Surgeon: Anell Baptist, DPM;  Location: [facility];  Service: Podiatry;                Laterality: Left; 09/15/2022: INCISION AND DRAINAGE OF WOUND; Left     Comment:  Procedure: 11044 - DEBRIDE BONE and EXCISION IF 1ST               METATARSAL BONE WITH  DELAY PRIMARY CLOSURE;  Surgeon:               Anell Baptist, DPM;  Location: [facility];  Service:               Podiatry;  Laterality: Left; 02/28/2023: IRRIGATION AND DEBRIDEMENT FOOT; Left     Comment:  Procedure: IRRIGATION AND DEBRIDEMENT FOOT;  Surgeon:               Anell Baptist, DPM;  Location: [facility];  Service:               Orthopedics/Podiatry;  Laterality: Left; No date: KNEE ARTHROSCOPY; Left 08/22/2022: LOWER EXTREMITY ANGIOGRAPHY; Left     Comment:  Procedure: Lower Extremity Angiography;  Surgeon:               Jackquelyn Mass, MD;  Location: [facility];               Service: Cardiovascular;  Laterality: Left; 09/15/2022: WOUND DEBRIDEMENT; Left     Comment:  Procedure: 11043 - DEBRIDE SKIN. MUSCLE FASCIA;                Surgeon: Anell Baptist, DPM;  Location: [facility];                Service: Podiatry;  Laterality: Left;  BMI    Body Mass Index: 26.73 kg/m      Reproductive/Obstetrics negative OB ROS                              Anesthesia Physical Anesthesia Plan  ASA: 3  Anesthesia Plan: General   Post-op Pain Management: Ofirmev  IV (intra-op)*   Induction: Intravenous  PONV Risk Score and Plan: 2 and Ondansetron , Dexamethasone  and Treatment may vary due to age or medical condition  Airway Management Planned: Natural Airway, Nasal Cannula and LMA  Additional Equipment: None  Intra-op Plan:   Post-operative Plan: Extubation in OR  Informed Consent: I have reviewed the patients History and Physical, chart, labs and discussed the procedure including the risks, benefits and alternatives for the proposed anesthesia with the patient or authorized representative who has indicated his/her understanding and acceptance.     Dental Advisory Given  Plan Discussed with: CRNA and Surgeon  Anesthesia Plan Comments: (Discussed risks of anesthesia with patient, including PONV, sore throat, lip/dental/eye damage. Rare risks discussed as well, such as cardiorespiratory and neurological sequelae, and allergic reactions. Discussed the role of CRNA in patient's perioperative care. Patient understands.)       Anesthesia Quick Evaluation

## 2023-09-14 NOTE — Anesthesia Postprocedure Evaluation (Signed)
 Anesthesia Post Note  [name]  Procedure(s) Performed: AMPUTATION, FOOT, RAY (Right: First Toe) TENOTOMY, ACHILLES (Right)  Patient location during evaluation: PACU Anesthesia Type: General Level of consciousness: awake and alert Pain management: pain level controlled Vital Signs Assessment: post-procedure vital signs reviewed and stable Respiratory status: spontaneous breathing, nonlabored ventilation, respiratory function stable and patient connected to nasal cannula oxygen Cardiovascular status: blood pressure returned to baseline and stable Postop Assessment: no apparent nausea or vomiting Anesthetic complications: no   No notable events documented.   Last Vitals:  Vitals:   09/14/23 0930 09/14/23 0946  BP: 136/79 133/79  Pulse: 75 90  Resp:  16  Temp:  36.5 C  SpO2: 95% 99%    Last Pain:  Vitals:   09/14/23 0946  TempSrc: Temporal  PainSc: 0-No pain                 Lattie Poli

## 2023-09-14 NOTE — H&P (Signed)
 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  The various methods of treatment have been discussed with the patient.  No guarantees were given.  After consideration of risks, benefits and other options for treatment, the patient has consented to surgery.  I have reviewed the patients' chart and labs.     A history and physical examination was performed in my office.  The patient was reexamined.  There have been no changes to this history and physical examination.  [name]

## 2023-09-14 NOTE — Discharge Instructions (Signed)
 [facility]  POST OPERATIVE INSTRUCTIONS FOR DR. Althea Atkinson AND DR. BAKER [facility] PODIATRY DEPARTMENT   Take your medication as prescribed.  Pain medication should be taken only as needed.  Keep the dressing clean, dry and intact.  Keep your foot elevated above the heart level for the first 48 hours.  We have instructed you to be non-weight bearing.  You can use your right lower extremity for transfer only with assistance.  Always wear your post-op shoe when walking.  Always use your crutches if you are to be non-weight bearing.  Do not take a shower. Baths are permissible as long as the foot is kept out of the water.   Every hour you are awake:   Massage calf 15 times  Call [facility] [phone]) if any of the following problems occur: You develop a temperature or fever. The bandage becomes saturated with blood. Medication does not stop your pain. Injury of the foot occurs. Any symptoms of infection including redness, odor, or red streaks running from wound.

## 2023-09-14 NOTE — Op Note (Signed)
 Operative note   Surgeon:Victoria Euceda [employer] logistics/support/administrative officer: None    Preop diagnosis: 1 ulcer right first MTPJ with osteomyelitis 2.  Equinus contracture right lower extremity    Postop diagnosis: Same    Procedure: 1.  Excision first metatarsal right foot 2.  Excision sesamoids plantar right first MTPJ 3.  Percutaneous tendo Achilles lengthening right lower extremity 4.  Intraoperative fluoroscopy use without assistance of radiologist    EBL: Minimal    Anesthesia:local and general.  Local consists of a total of 20 cc of 0.5% bupivacaine  and 10 cc of lidocaine     Hemostasis: None    Specimen: Deep wound culture and bone and sesamoid for pathology    Complications: None    Operative indications:Andrew Harding is an 73 y.o. that presents today for surgical intervention.  The risks/benefits/alternatives/complications have been discussed and consent has been given.    Procedure:  Patient was brought into the OR and placed on the operating table in thesupine position. After anesthesia was obtained theright lower extremity was prepped and draped in usual sterile fashion.  Attention was initially directed to the posterior aspect of the right lower extremity at the Achilles insertional site.  At 1 3 and 5 cm from its insertion to medial Hemi sections and 1 lateral Hemi section were performed.  Dorsiflexion of the foot was performed and excursion of the tendon was noted with more flexibility.  The equinus contracture was markedly improved.  At this time the 3 incisions were closed with 3-0 nylon.  Attention was directed to the dorsal aspect of the first metatarsal where a dorsal incision was performed.  Full-thickness flaps were created down to the metatarsal.  At the midshaft of the metatarsal an osteotomy was created beveled to decrease medial pressure.  The metatarsal was then excised and removed from the surgical field in toto.  Attention was directed to the plantar aspect of the foot where the noted  necrotic ulceration was found.  Full-thickness excision of the necrotic tissue was performed.  At this time the sesamoids were noted and both the tibial and fibular sesamoids were then excised through the plantar ulcerative incision site.  All wounds were flushed with copious amounts of irrigation.  The plantar ulceration was then debrided with a Versajet.  The wounds were flushed 1 final time.  At this time the dorsal incision was closed with a 3-0 Vicryl the subcutaneous tissue and 3-0 nylon for the skin.  The plantar ulceration was closed with a 3-0 nylon.  Intraoperative fluoroscopy was used throughout to assist with removal of the metatarsal and sesamoids.    Patient tolerated the procedure and anesthesia well.  Was transported from the OR to the PACU with all vital signs stable and vascular status intact. To be discharged per routine protocol.  Will follow up in approximately 1 week in the outpatient clinic.

## 2023-09-15 ENCOUNTER — Encounter: Payer: Self-pay | Admitting: Podiatry

## 2023-09-17 ENCOUNTER — Encounter: Admitting: Physical Therapy

## 2023-09-17 LAB — SURGICAL PATHOLOGY

## 2023-09-18 ENCOUNTER — Encounter (INDEPENDENT_AMBULATORY_CARE_PROVIDER_SITE_OTHER): Payer: Self-pay

## 2023-09-19 ENCOUNTER — Encounter: Admitting: Physical Therapy

## 2023-09-23 LAB — AEROBIC/ANAEROBIC CULTURE W GRAM STAIN (SURGICAL/DEEP WOUND): Gram Stain: NONE SEEN

## 2023-09-27 ENCOUNTER — Encounter: Admitting: Physical Therapy

## 2023-09-27 NOTE — Progress Notes (Signed)
 CC: Chief Complaint  Patient presents with  . Post Operative Visit    Right foot    Andrew Harding presents for follow up of right first toe amputation.  Also tendo Achilles lengthening..    Objective: Incisions are coapted.  No dehiscence.     The following labs were personally reviewed by myself: Lab Results  Component Value Date   WBC 6.1 06/19/2023   HGB 12.1 (L) 06/19/2023   HCT 37.5 (L) 06/19/2023   PLT 207 06/19/2023   Lab Results  Component Value Date   NA 142 06/19/2023   K 4.4 06/19/2023   CL 110 (H) 06/19/2023   CO2 17.0 (L) 06/19/2023   BUN 15 06/19/2023   CREATININE 1.1 06/19/2023   CALCIUM 8.8 06/19/2023   TP 6.5 09/17/2012   ALB 4.2 06/19/2023   TBILI 0.4 06/19/2023   ALKPHOS 53 06/19/2023   AST 19 06/19/2023   ALT 14 06/19/2023   GLUCOSE 117 (H) 06/19/2023   GFR 71 06/19/2023   Lab Results  Component Value Date   HGBA1C 6.8 (H) 06/19/2023    Assessment: Encounter Diagnoses  Name Primary?  . Postop check Yes  . 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 Type 2 diabetes mellitus with polyneuropathy (CMS/HHS-HCC)     Plan: Continue with dressing changes and offloading.  Follow-up in a week..  Orders Placed This Encounter  Procedures  . X-ray foot right 3 plus views    Standing Status:   Future    Number of Occurrences:   1    Standing Expiration Date:   09/19/2024    Order Specific Question:   This procedure is enabled for patient scheduling in MyChart. Please indicate if the patient should not be able to schedule an appointment for this procedure in MyChart.    Answer:   Allow MyChart Scheduling    Order Specific Question:   Release to patient    Answer:   Immediate

## 2023-09-27 NOTE — Progress Notes (Signed)
 Chief Complaint:   Chief Complaint  Patient presents with  . Post Operative Visit    Subjective  HPI  Andrew Harding is a 73 y.o. male who presents for Post Operative Visit History of Present Illness Andrew Harding is a 73 year old male with diabetes who presents for follow-up on wound healing post foot surgery.  He is two weeks post-surgery for a foot wound, which involved debridement and closure. There is ongoing drainage from the wound, particularly from a challenging area on the bottom of the foot. The drainage is more pronounced than bleeding, and his wife changes the dressing every two to three days, using Betadine  for cleaning. The drainage causes maceration and weakness of the surrounding skin.  He completed a course of sulfa  antibiotics and is currently off antibiotics. The wound on the top of the foot is healing well, but the bottom wound remains problematic. His wife diligently cleans and monitors the wound, which he finds helpful.  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 Stress on the foot, such as squatting or pushing up, is difficult at this time. He is using a boot, which affects his balance due to the height difference with his other shoe.  No fever or signs of infection, but there is ongoing drainage from the wound.  Review of Systems  Patient Active Problem List  Diagnosis  . Coronary artery disease  . Mixed hyperlipidemia  . Gout  . Vitamin D  deficiency  . Benign essential hypertension  . CKD (chronic kidney disease) stage 3, GFR 30-59 ml/min (CMS/HHS-HCC)  . Status post amputation of toe of right foot (CMS/HHS-HCC)  . Diabetic polyneuropathy associated with type 2 diabetes mellitus (CMS/HHS-HCC)  . Type 2 diabetes mellitus with stage 3 chronic kidney disease, with long-term current use of insulin  (CMS/HHS-HCC)  . Type 2 diabetes mellitus with vascular disease  (CMS/HHS-HCC)  . Uncontrolled type 2 diabetes mellitus with hyperglycemia, with long-term current use of insulin  (CMS/HHS-HCC)  . Paroxysmal atrial fibrillation (CMS/HHS-HCC)  . Postural dizziness with presyncope  . S/P BKA (below knee amputation) unilateral, left (CMS/HHS-HCC)    Outpatient Medications Prior to Visit  Medication Sig Dispense Refill  . apixaban  (ELIQUIS ) 5 mg tablet Take 5 mg by mouth every 12 (twelve) hours    . chlorhexidine  (HIBICLENS ) 4 % external wash Apply 1 Application topically    . clopidogreL  (PLAVIX ) 75 mg tablet Take 75 mg by mouth once daily    . flash glucose scanning (FREESTYLE LIBRE 2 READER) reader Use 1 Device as directed (Patient not taking: Reported on 09/20/2023) 1 each 1  . flash glucose sensor (FREESTYLE LIBRE 2 SENSOR) kit USE ONE (1) SENSOR EVERY 14 DAYS FOR GLUCOSE MONITORING AS DIRECTED (Patient not taking: Reported on 09/20/2023) 6 kit 3  . gabapentin  (NEURONTIN ) 300 MG capsule Take 1 capsule (300 mg total) by mouth 3 (three) times daily 270 capsule 3  . insulin  GLARGINE (LANTUS  SOLOSTAR U-100 INSULIN ) pen injector (concentration 100 units/mL) Inject 36 Units subcutaneously at bedtime 45 mL 3  . lancets (ONETOUCH DELICA PLUS LANCET) Use 1 each 3 (three) times daily 300 each 3  . metFORMIN (GLUCOPHAGE-XR) 500 MG XR tablet Take 4 tablets (2,000 mg total) by mouth daily with dinner 360 tablet 3  . metoprolol  TARTrate (LOPRESSOR ) 25 MG tablet Take 1 tablet (25 mg total) by mouth 2 (two) times daily 180 tablet 3  . mupirocin  (BACTROBAN ) 2 % ointment  Place 1 Application into one nostril    . oxyCODONE -acetaminophen  (PERCOCET) 5-325 mg tablet Take 1-2 tablets by mouth    . pen needle, diabetic (BD ULTRA-FINE NANO PEN NEEDLE) 32 gauge x 5/32 Ndle 1 each once daily 100 each 1  . pravastatin  (PRAVACHOL ) 80 MG tablet Take 1 tablet (80 mg total) by mouth every evening 90 tablet 3  . semaglutide (OZEMPIC) 1 mg/dose (4 mg/3 mL) pen injector Inject 0.75 mLs (1 mg  total) subcutaneously once a week 9 mL 3  . traZODone  (DESYREL ) 50 MG tablet Take 1 tablet (50 mg total) by mouth at bedtime as needed 90 tablet 3   No facility-administered medications prior to visit.      Objective   Vitals:   09/27/23 1135  BP: 139/85  Weight: 91.6 kg (201 lb 15.1 oz)  Height: 190.5 cm (6' 3)  PainSc: 0-No pain   Body mass index is 25.24 kg/m.  Home Vitals:     Physical Exam Physical Exam SKIN: Redness around wound, slightly irritated. Dark area, possible blister under dead skin. Wound draining, skin weak. Incision on top healing well. Wound on bottom in tough spot, slow healing.     The following labs were personally reviewed: - Lab Results  Component Value Date   WBC 6.1 06/19/2023   HGB 12.1 (L) 06/19/2023   HCT 37.5 (L) 06/19/2023   PLT 207 06/19/2023   - Lab Results  Component Value Date   NA 142 06/19/2023   K 4.4 06/19/2023   CL 110 (H) 06/19/2023   CO2 17.0 (L) 06/19/2023   BUN 15 06/19/2023   CREATININE 1.1 06/19/2023   GLUCOSE 117 (H) 06/19/2023   - Lab Results  Component Value Date   NA 142 06/19/2023   K 4.4 06/19/2023   CL 110 (H) 06/19/2023   CO2 17.0 (L) 06/19/2023   BUN 15 06/19/2023   CREATININE 1.1 06/19/2023   CALCIUM 8.8 06/19/2023   TP 6.5 09/17/2012   ALB 4.2 06/19/2023   TBILI 0.4 06/19/2023   ALKPHOS 53 06/19/2023   AST 19 06/19/2023   ALT 14 06/19/2023   GLUCOSE 117 (H) 06/19/2023   GFR 71 06/19/2023   - Lab Results  Component Value Date   HGBA1C 6.8 (H) 06/19/2023      Results Procedure: Wound debridement Description: Cleaned the wound with Betadine . Removed necrotic tissue and eschar. Opened a blister under necrotic tissue to allow drainage. Applied padding and tape.     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 The dorsal incision is healing well, but the plantar wound is slow to heal, which is expected given the location and his diabetic status. Previous debridement and bone scraping were performed during surgery. Current drainage weakens the skin, necessitating monitoring for infection or increased erythema. He is advised against submerging the foot in water to prevent complications. - Continue daily dressing changes to manage drainage and prevent maceration. - Use Betadine  for wound cleaning and drying. - Ensure proper padding to protect the area and prevent irritation from the wrap. - Avoid submerging the foot in water. - Consider using an 'even up' sandal to improve balance and comfort while ambulating. - Monitor for signs of infection, such as increased erythema or drainage. - Schedule follow-up in 1.5 weeks to assess healing progress. - I  did review his culture result which was consistent with MRSA.  Sensitive to Bactrim .  Will go ahead and refill for 1 more week Bactrim .  This was sent into his pharmacy today.   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 (CMS/HHS-HCC)  Other orders -     sulfamethoxazole -trimethoprim  (BACTRIM  DS) 800-160 mg tablet; Take 1 tablet (160 mg of trimethoprim  total) by mouth 2 (two) times daily for 7 days          Future Appointments     Date/Time Provider Department Center Visit Type   10/09/2023 9:15 AM Ashley Eva Dawn, DPM [facility] C POST-OP   11/06/2023 7:45 AM [facility] C LAB   11/09/2023 11:15 AM Solum, Therisa Setter, MD [facility] C RETURN VISIT       There are no Patient Instructions on file for this visit.   This note has been created using automated tools and reviewed for accuracy by JUSTIN ALLEN FOWLER.

## 2023-09-28 NOTE — Interval H&P Note (Signed)
 History and Physical Interval Note:  09/28/2023 6:03 PM  Andrew Harding  has presented today for surgery, with the diagnosis of RLE Angio   ASO w rest pain.  The various methods of treatment have been discussed with the patient and family. After consideration of risks, benefits and other options for treatment, the patient has consented to  Procedure(s): LOWER EXTREMITY INTERVENTION (Right) Lower Extremity Angiography (Right) as a surgical intervention.  The patient's history has been reviewed, patient examined, no change in status, stable for surgery.  I have reviewed the patient's chart and labs.  Questions were answered to the patient's satisfaction.     Devon Fogo

## 2023-10-01 ENCOUNTER — Encounter: Payer: Self-pay | Admitting: Vascular Surgery

## 2023-10-02 ENCOUNTER — Encounter: Admitting: Physical Therapy

## 2023-10-02 NOTE — Progress Notes (Signed)
 Chief Complaint:   Chief Complaint  Patient presents with  . Post Operative Visit    Right foot ulcer.     Subjective  HPI  Andrew Harding is a 73 y.o. male who presents for Post Operative Visit (Right foot ulcer. ) History of Present Illness Andrew Harding is a 73 year old male who presents with increased drainage and irritation at a foot surgical site.  He has increased drainage from the surgical site on his foot, which was first noticed by his wife. The drainage became more significant last night, and there is concern about a possible odor, although no smell was noticed before. The drainage is coming from the most affected part of the tissue, which appears dark and irritated.  He has been taking Bactrim , with only three doses remaining. His wife changes the bandages every evening around 7 PM, and he has observed that the drainage increases between 7 PM and 11 PM.  He experiences a lack of energy and decreased appetite since the surgery. He has not been walking much since being hospitalized on January 9th, leading to a loss of strength in his arms and legs. He only gets up to go to the bathroom and occasionally to the kitchen, contributing to his decreased physical condition.  He is currently taking his medications as prescribed, including those for his blood sugar, which was 108 today and has been between 140 and 90. He has difficulty sleeping, often waking up multiple times during the night, and sometimes falls asleep in his chair with his foot elevated.  No fever, chills, or significant changes in his condition since the surgery, except for decreased energy and appetite.  Review of Systems  Patient Active Problem List  Diagnosis  . Coronary artery disease  . Mixed hyperlipidemia  . Gout  . Vitamin D  deficiency  . Benign essential hypertension  . CKD (chronic kidney disease) stage 3, GFR 30-59 ml/min (CMS/HHS-HCC)  . Status post amputation of toe of right foot (CMS/HHS-HCC)  . Diabetic  polyneuropathy associated with type 2 diabetes mellitus (CMS/HHS-HCC)  . Type 2 diabetes mellitus with stage 3 chronic kidney disease, with long-term current use of insulin  (CMS/HHS-HCC)  . Type 2 diabetes mellitus with vascular disease (CMS/HHS-HCC)  . Uncontrolled type 2 diabetes mellitus with hyperglycemia, with long-term current use of insulin  (CMS/HHS-HCC)  . Paroxysmal atrial fibrillation (CMS/HHS-HCC)  . Postural dizziness with presyncope  . S/P BKA (below knee amputation) unilateral, left (CMS/HHS-HCC)    Outpatient Medications Prior to Visit  Medication Sig Dispense Refill  . apixaban  (ELIQUIS ) 5 mg tablet Take 5 mg by mouth every 12 (twelve) hours    . chlorhexidine  (HIBICLENS ) 4 % external wash Apply 1 Application topically    . clopidogreL  (PLAVIX ) 75 mg tablet Take 75 mg by mouth once daily    . gabapentin  (NEURONTIN ) 300 MG capsule Take 1 capsule (300 mg total) by mouth 3 (three) times daily 270 capsule 3  . insulin  GLARGINE (LANTUS  SOLOSTAR U-100 INSULIN ) pen injector (concentration 100 units/mL) Inject 36 Units subcutaneously at bedtime 45 mL 3  . metFORMIN (GLUCOPHAGE-XR) 500 MG XR tablet Take 4 tablets (2,000 mg total) by mouth daily with dinner 360 tablet 3  . metoprolol  TARTrate (LOPRESSOR ) 25 MG tablet Take 1 tablet (25 mg total) by mouth 2 (two) times daily 180 tablet 3  . mupirocin  (BACTROBAN ) 2 % ointment Place 1 Application into one nostril    . oxyCODONE -acetaminophen  (PERCOCET) 5-325 mg tablet Take 1-2 tablets by mouth    . pen  needle, diabetic (BD ULTRA-FINE NANO PEN NEEDLE) 32 gauge x 5/32 Ndle 1 each once daily 100 each 1  . pravastatin  (PRAVACHOL ) 80 MG tablet Take 1 tablet (80 mg total) by mouth every evening 90 tablet 3  . semaglutide (OZEMPIC) 1 mg/dose (4 mg/3 mL) pen injector Inject 0.75 mLs (1 mg total) subcutaneously once a week 9 mL 3  . sulfamethoxazole -trimethoprim  (BACTRIM  DS) 800-160 mg tablet Take 1 tablet (160 mg of trimethoprim  total) by mouth 2  (two) times daily for 7 days 14 tablet 0  . traZODone  (DESYREL ) 50 MG tablet Take 1 tablet (50 mg total) by mouth at bedtime as needed 90 tablet 3  . flash glucose scanning (FREESTYLE LIBRE 2 READER) reader Use 1 Device as directed (Patient not taking: Reported on 07/23/2023) 1 each 1  . flash glucose sensor (FREESTYLE LIBRE 2 SENSOR) kit USE ONE (1) SENSOR EVERY 14 DAYS FOR GLUCOSE MONITORING AS DIRECTED (Patient not taking: Reported on 10/02/2023) 6 kit 3  . lancets (ONETOUCH DELICA PLUS LANCET) Use 1 each 3 (three) times daily 300 each 3   No facility-administered medications prior to visit.      Objective   Vitals:   10/02/23 1118  BP: 131/77  Weight: 91.6 kg (201 lb 15.1 oz)  Height: 190.5 cm (6' 3)  PainSc: 0-No pain   Body mass index is 25.24 kg/m.  Home Vitals:     Physical Exam Physical Exam SKIN: Skin irritated with drainage.  Noted drainage to the plantar aspect of the skin flap.  Still some darkened discoloration to the distal portion of the flap.  Surrounding erythema looks to be stable at this time.    The following labs were personally reviewed: - Lab Results  Component Value Date   WBC 6.1 06/19/2023   HGB 12.1 (L) 06/19/2023   HCT 37.5 (L) 06/19/2023   PLT 207 06/19/2023   - Lab Results  Component Value Date   NA 142 06/19/2023   K 4.4 06/19/2023   CL 110 (H) 06/19/2023   CO2 17.0 (L) 06/19/2023   BUN 15 06/19/2023   CREATININE 1.1 06/19/2023   GLUCOSE 117 (H) 06/19/2023   - Lab Results  Component Value Date   NA 142 06/19/2023   K 4.4 06/19/2023   CL 110 (H) 06/19/2023   CO2 17.0 (L) 06/19/2023   BUN 15 06/19/2023   CREATININE 1.1 06/19/2023   CALCIUM 8.8 06/19/2023   TP 6.5 09/17/2012   ALB 4.2 06/19/2023   TBILI 0.4 06/19/2023   ALKPHOS 53 06/19/2023   AST 19 06/19/2023   ALT 14 06/19/2023   GLUCOSE 117 (H) 06/19/2023   GFR 71 06/19/2023   - Lab Results  Component Value Date   HGBA1C 6.8 (H) 06/19/2023       Results Procedure: Wound Debridement Description: Debrided outer skin. Observed healthy underlying tissue. Removed one suture to inspect the underlying skin, which appeared healthy. Noted some serous drainage and mild erythema.  LABS Blood Glucose: 108 (10/02/2023) Blood Glucose: 140 (10/02/2023) Blood Glucose: 90 (10/02/2023)  RADIOLOGY Left Foot X-ray: Normal (10/02/2023)     Assessment/Plan:   Assessment & Plan Ulcer of right foot with bone involvement The ulcer on the right foot exhibits increased drainage and redness, suggesting possible infection. X-rays show stable bone involvement, but the wound is not healing as expected, raising concerns about the viability of the skin flap. The outer skin is dark, though healthy skin is present underneath. There is a risk of flap necrosis,  which could necessitate a transmetatarsal amputation if tissue viability is compromised. He and his wife are informed of the potential need for further surgery if the condition deteriorates. - Continue Bactrim  and add Ciprofloxacin  for broader antibiotic coverage. - Send wound culture to identify specific pathogens. - Monitor for severe infection signs: increased redness, odor, pus, fever, and chills. - Clean wound with betadine  iodine  and maintain wound care regimen with dressing changes. - Schedule follow-up appointment for next Tuesday. - Consider stitch removal next week if healing progresses. - Discuss potential for transmetatarsal amputation if flap necrosis occurs.  Diabetes mellitus Blood glucose levels are well-controlled, ranging from 108 to 140. He is compliant with his medication regimen, which is crucial for wound healing and preventing complications. - Continue monitoring blood glucose levels. - Maintain current diabetes medication regimen, ensuring compliance with insulin  and oral hypoglycemic agents.  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 without evidence of necrosis (CMS/HHS-HCC) -     X-ray foot right 3 plus views -     Anaerobic and Aerobic Culture - [facility]  Other orders -     sulfamethoxazole -trimethoprim  (BACTRIM  DS) 800-160 mg tablet; Take 1 tablet (160 mg of trimethoprim  total) by mouth 2 (two) times daily for 7 days -     ciprofloxacin  HCl (CIPRO ) 500 MG tablet; Take 1 tablet (500 mg total) by mouth 2 (two) times daily for 7 days          Future Appointments     Date/Time Provider Department Center Visit Type   10/09/2023 9:15 AM Ashley Eva Dawn, DPM [facility] C POST-OP   11/06/2023 7:45 AM [facility] C LAB   11/09/2023 11:15 AM Solum, Therisa Setter, MD [facility] C RETURN VISIT       There are no Patient Instructions on file for this visit.   This note has been created using automated tools and reviewed for accuracy by JUSTIN ALLEN FOWLER.

## 2023-10-04 ENCOUNTER — Other Ambulatory Visit (INDEPENDENT_AMBULATORY_CARE_PROVIDER_SITE_OTHER): Payer: Self-pay | Admitting: Vascular Surgery

## 2023-10-04 DIAGNOSIS — I739 Peripheral vascular disease, unspecified: Secondary | ICD-10-CM

## 2023-10-08 ENCOUNTER — Ambulatory Visit (INDEPENDENT_AMBULATORY_CARE_PROVIDER_SITE_OTHER): Admitting: Vascular Surgery

## 2023-10-08 ENCOUNTER — Encounter (INDEPENDENT_AMBULATORY_CARE_PROVIDER_SITE_OTHER): Payer: Self-pay | Admitting: Vascular Surgery

## 2023-10-08 ENCOUNTER — Ambulatory Visit (INDEPENDENT_AMBULATORY_CARE_PROVIDER_SITE_OTHER)

## 2023-10-08 VITALS — BP 117/77 | HR 71 | Resp 16

## 2023-10-08 DIAGNOSIS — I7025 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: Secondary | ICD-10-CM

## 2023-10-08 DIAGNOSIS — I739 Peripheral vascular disease, unspecified: Secondary | ICD-10-CM | POA: Diagnosis not present

## 2023-10-08 DIAGNOSIS — Z9889 Other specified postprocedural states: Secondary | ICD-10-CM | POA: Diagnosis not present

## 2023-10-08 DIAGNOSIS — I4811 Longstanding persistent atrial fibrillation: Secondary | ICD-10-CM

## 2023-10-08 DIAGNOSIS — I25119 Atherosclerotic heart disease of native coronary artery with unspecified angina pectoris: Secondary | ICD-10-CM

## 2023-10-08 DIAGNOSIS — I10 Essential (primary) hypertension: Secondary | ICD-10-CM

## 2023-10-08 DIAGNOSIS — E1140 Type 2 diabetes mellitus with diabetic neuropathy, unspecified: Secondary | ICD-10-CM

## 2023-10-08 LAB — VAS US ABI WITH/WO TBI: Right ABI: >1

## 2023-10-09 NOTE — Progress Notes (Signed)
 Chief Complaint:   Chief Complaint  Patient presents with  . Post Operative Visit    Right foot     Subjective  HPI  Andrew Harding is a 73 y.o. male who presents for Post Operative Visit (Right foot ) History of Present Illness Andrew Harding is a 73 year old male who presents with follow-up of wound drainage and management for a MRSA infection.  He is following up for a MRSA infection that has been treated with Bactrim  and Cipro . He is close to running out of these medications and has been taking them as prescribed.  The infection is located on his skin, with significant drainage from the wound ongoing for over fifteen hours since the last bandage change at 7 PM the previous night. The drainage occurs from both the top and bottom of the wound.  He is currently using Betadine  for wound care and has been following a regimen of bandage changes to manage the drainage.  No fever or increased redness reported.  Review of Systems  Patient Active Problem List  Diagnosis  . Coronary artery disease  . Mixed hyperlipidemia  . Gout  . Vitamin D  deficiency  . Benign essential hypertension  . CKD (chronic kidney disease) stage 3, GFR 30-59 ml/min (CMS/HHS-HCC)  . Status post amputation of toe of right foot (CMS/HHS-HCC)  . Diabetic polyneuropathy associated with type 2 diabetes mellitus (CMS/HHS-HCC)  . Type 2 diabetes mellitus with stage 3 chronic kidney disease, with long-term current use of insulin  (CMS/HHS-HCC)  . Type 2 diabetes mellitus with vascular disease (CMS/HHS-HCC)  . Uncontrolled type 2 diabetes mellitus with hyperglycemia, with long-term current use of insulin  (CMS/HHS-HCC)  . Paroxysmal atrial fibrillation (CMS/HHS-HCC)  . Postural dizziness with presyncope  . S/P BKA (below knee amputation) unilateral, left (CMS/HHS-HCC)  . Atherosclerosis of native arteries of extremity with intermittent claudication ()  . Atherosclerosis of native arteries of the extremities with ulceration  (CMS/HHS-HCC)    Outpatient Medications Prior to Visit  Medication Sig Dispense Refill  . apixaban  (ELIQUIS ) 5 mg tablet Take 5 mg by mouth every 12 (twelve) hours    . chlorhexidine  (HIBICLENS ) 4 % external wash Apply 1 Application topically    . clopidogreL  (PLAVIX ) 75 mg tablet Take 75 mg by mouth once daily    . flash glucose scanning (FREESTYLE LIBRE 2 READER) reader Use 1 Device as directed (Patient not taking: Reported on 07/23/2023) 1 each 1  . flash glucose sensor (FREESTYLE LIBRE 2 SENSOR) kit USE ONE (1) SENSOR EVERY 14 DAYS FOR GLUCOSE MONITORING AS DIRECTED (Patient not taking: Reported on 10/02/2023) 6 kit 3  . gabapentin  (NEURONTIN ) 300 MG capsule Take 1 capsule (300 mg total) by mouth 3 (three) times daily 270 capsule 3  . insulin  GLARGINE (LANTUS  SOLOSTAR U-100 INSULIN ) pen injector (concentration 100 units/mL) Inject 36 Units subcutaneously at bedtime 45 mL 3  . lancets (ONETOUCH DELICA PLUS LANCET) Use 1 each 3 (three) times daily 300 each 3  . metFORMIN (GLUCOPHAGE-XR) 500 MG XR tablet Take 4 tablets (2,000 mg total) by mouth daily with dinner 360 tablet 3  . metoprolol  TARTrate (LOPRESSOR ) 25 MG tablet Take 1 tablet (25 mg total) by mouth 2 (two) times daily 180 tablet 3  . mupirocin  (BACTROBAN ) 2 % ointment Place 1 Application into one nostril    . oxyCODONE -acetaminophen  (PERCOCET) 5-325 mg tablet Take 1-2 tablets by mouth    . pen needle, diabetic (BD ULTRA-FINE NANO PEN NEEDLE) 32 gauge x 5/32 Ndle 1 each once  daily 100 each 1  . pravastatin  (PRAVACHOL ) 80 MG tablet Take 1 tablet (80 mg total) by mouth every evening 90 tablet 3  . semaglutide (OZEMPIC) 1 mg/dose (4 mg/3 mL) pen injector Inject 0.75 mLs (1 mg total) subcutaneously once a week 9 mL 3  . traZODone  (DESYREL ) 50 MG tablet Take 1 tablet (50 mg total) by mouth at bedtime as needed 90 tablet 3  . ciprofloxacin  HCl (CIPRO ) 500 MG tablet Take 1 tablet (500 mg total) by mouth 2 (two) times daily for 7 days 7 tablet 1   . sulfamethoxazole -trimethoprim  (BACTRIM  DS) 800-160 mg tablet Take 1 tablet (160 mg of trimethoprim  total) by mouth 2 (two) times daily for 7 days 14 tablet 0   No facility-administered medications prior to visit.      Objective  Vitals:   10/09/23 1347  BP: 125/82  Weight: 91.6 kg (201 lb 15.1 oz)  Height: 190.5 cm (6' 3)  PainSc: 0-No pain   Body mass index is 25.24 kg/m.  Home Vitals:     Physical Exam Physical Exam SKIN: Drainage present on skin with ongoing drainage from a main spot, both top and bottom. Skin flap appears improved, but some skin edges are not fully closed. Good skin appearance in some areas, with pink skin along the edge and redder material in the middle appearing good. Black area indicates necrotic outer skin layers. Good skin found underneath peeled area. Previously bright red area now improved.   The following labs were personally reviewed: - Lab Results  Component Value Date   WBC 6.1 06/19/2023   HGB 12.1 (L) 06/19/2023   HCT 37.5 (L) 06/19/2023   PLT 207 06/19/2023   - Lab Results  Component Value Date   NA 142 06/19/2023   K 4.4 06/19/2023   CL 110 (H) 06/19/2023   CO2 17.0 (L) 06/19/2023   BUN 15 06/19/2023   CREATININE 1.1 06/19/2023   GLUCOSE 117 (H) 06/19/2023   - Lab Results  Component Value Date   NA 142 06/19/2023   K 4.4 06/19/2023   CL 110 (H) 06/19/2023   CO2 17.0 (L) 06/19/2023   BUN 15 06/19/2023   CREATININE 1.1 06/19/2023   CALCIUM 8.8 06/19/2023   TP 6.5 09/17/2012   ALB 4.2 06/19/2023   TBILI 0.4 06/19/2023   ALKPHOS 53 06/19/2023   AST 19 06/19/2023   ALT 14 06/19/2023   GLUCOSE 117 (H) 06/19/2023   GFR 71 06/19/2023   - Lab Results  Component Value Date   HGBA1C 6.8 (H) 06/19/2023      Results Procedure: Wound Debridement Description: Debrided non-viable tissue from the wound. Examined underlying tissue, noting healthy tissue and areas of compromised skin. Identified drainage from both the  superior and inferior aspects of the wound. Observed healthy skin along the edge and erythematous tissue in the center. Noted necrotic areas with viable tissue underneath.     Assessment/Plan:   Assessment & Plan MRSA infection The MRSA infection on the right foot is being managed with Cipro  and Bactrim . There is drainage present, indicating ongoing infection, but signs of improvement are noted in some areas. The flap of skin is monitored to prevent infection pockets and promote healing. Necrosis is present in outer skin layers, but viable skin underneath suggests healing potential. Drainage is slowing healing, but improvement is seen in redness and skin edges. Calcium alginate with silver is introduced to absorb fluid and kill superficial bacteria, aiding healing. - Refill Cipro  and Bactrim  for  at least another week. - Order calcium alginate with silver for daily application to absorb fluid and kill superficial bacteria. - Instruct to apply calcium alginate directly on the wound, cover with gauze, pad, and wrap. - Schedule follow-up appointment in two weeks. - If issues arise, consult with Doctor Lennie or Doctor Fernande, who are familiar with the case.  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 (CMS/HHS-HCC)  Type 2 diabetes mellitus with polyneuropathy (CMS/HHS-HCC)  Other acute osteomyelitis of left foot (CMS/HHS-HCC)  Other orders -     ciprofloxacin  HCl (CIPRO ) 500 MG tablet; Take 1 tablet (500 mg total) by mouth 2 (two) times daily for 7 days -     sulfamethoxazole -trimethoprim  (BACTRIM  DS) 800-160 mg tablet; Take 1 tablet (160 mg of trimethoprim  total) by mouth 2 (two) times daily for 7 days -     Order          Future Appointments     Date/Time Provider Department Center Visit Type   10/23/2023 2:30 PM Ashley Eva Dawn, DPM [facility] C POST-OP   11/06/2023 7:45 AM [facility] C LAB   11/09/2023 11:15 AM Solum, Therisa Setter, MD [facility] C RETURN VISIT       There are no Patient Instructions on file for this visit.   This note has been created using automated tools and reviewed for accuracy by JUSTIN ALLEN FOWLER.

## 2023-10-10 ENCOUNTER — Encounter (INDEPENDENT_AMBULATORY_CARE_PROVIDER_SITE_OTHER): Payer: Self-pay | Admitting: Vascular Surgery

## 2023-10-10 NOTE — Progress Notes (Signed)
 MRN : [id number]  Andrew Harding is a 73 y.o. (05/01/1951) male who presents with chief complaint of check circulation.  History of Present Illness:   The patient returns to the office for followup and review status post angiogram with intervention on 09/11/2023.   Procedure:  1.  Percutaneous transluminal angioplasty right mid popliteal artery.  2.  Percutaneous transluminal angioplasty and stent placement right anterior tibial artery.     3.  Percutaneous transluminal angioplasty right tibioperoneal trunk.    The patient notes improvement in the lower extremity symptoms. No interval shortening of the patient's claudication distance and his pain symptoms are improved. No new ulcers or wounds have occurred since the last visit.  He is to follow-up with Dr. Alicia Inoue later this week.  There have been no significant changes to the patient's overall health care.  No documented history of amaurosis fugax or recent TIA symptoms. There are no recent neurological changes noted. No documented history of DVT, PE or superficial thrombophlebitis. The patient denies recent episodes of angina or shortness of breath.   ABI's Rt=[place] TBI=0.33 and Lt=BKA     Current Meds  Medication Sig   acetaminophen  (TYLENOL ) 325 MG tablet Take 1-2 tablets (325-650 mg total) by mouth every 4 (four) hours as needed for mild pain.   cefUROXime  (CEFTIN ) 500 MG tablet Take 500 mg by mouth 2 (two) times daily.   chlorhexidine  (HIBICLENS ) 4 % external liquid Apply 15 mLs (1 Application total) topically as directed for 30 doses. Use as directed daily for 5 days every other week for 6 weeks.   Continuous Glucose Sensor (FREESTYLE LIBRE 2 SENSOR) MISC    ELIQUIS  5 MG TABS tablet TAKE 1 TABLET(5 MG) BY MOUTH TWICE DAILY   gabapentin  (NEURONTIN ) 300 MG capsule Take 1 capsule (300 mg total) by mouth 3 (three) times daily.   glucose blood (ONETOUCH ULTRA) test  strip Use 3 (three) times daily   insulin  glargine (LANTUS ) 100 UNIT/ML Solostar Pen Inject 36 Units into the skin at bedtime. (Patient taking differently: Inject 34 Units into the skin at bedtime.)   Insulin  Pen Needle (FIFTY50 PEN NEEDLES) 32G X 4 MM MISC USE 4 TIMES DAILY   metFORMIN (GLUCOPHAGE-XR) 500 MG 24 hr tablet Take 2,000 mg by mouth daily with supper.   metoprolol  tartrate (LOPRESSOR ) 25 MG tablet Take 1 tablet (25 mg total) by mouth 2 (two) times daily.   mupirocin  ointment (BACTROBAN ) 2 % Place 1 Application into the nose 2 (two) times daily for 60 doses. Use as directed 2 times daily for 5 days every other week for 6 weeks.   oxyCODONE -acetaminophen  (PERCOCET) 5-325 MG tablet Take 1-2 tablets by mouth every 6 (six) hours as needed for severe pain (pain score 7-10). Max 6 tabs per day   pravastatin  (PRAVACHOL ) 80 MG tablet Take 80 mg by mouth at bedtime.   traZODone  (DESYREL ) 50 MG tablet Take 50 mg by mouth at bedtime.    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) 09/03/2023   Atrial fibrillation with RVR (HCC) 08/19/2022   Bladder neck obstruction    Cellulitis 08/17/2022   Chronic kidney disease    Coronary artery disease    a.) s/p 4v CABG in 2014   Diabetes mellitus without complication (HCC)    Diabetic neuropathy (HCC)    Diabetic peripheral neuropathy (HCC)    Diabetic ulcer of toe of left foot associated with diabetes mellitus of other type, limited to breakdown of skin (HCC) 12/21/2017   Diverticulosis    Gout    Gram-negative bacteremia 05/11/2023   Heart murmur    History of osteomyelitis 05/31/2015   Hypercholesteremia    Hyperlipidemia    Hypertension    Hypotension due to hypovolemia 08/17/2022   Infection of left foot 08/19/2022   MSSA bacteremia 02/28/2023   Open wound of  left foot with complication 05/10/2023   Osteomyelitis (HCC) 05/31/2015   Peripheral neuropathy    Postural dizziness with presyncope 02/26/2023   S/P BKA (below knee amputation) unilateral, 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   S/P transmetatarsal amputation of foot, left (HCC) 08/29/2022   Sepsis (HCC) 08/17/2022   Sepsis (HCC) 05/10/2023   Status post amputation of toe of right foot (HCC) 11/01/2015   Tubular adenoma    Vitamin D  deficiency     Past Surgical History:  Procedure Laterality Date   ACHILLES TENDON SURGERY Right 09/14/2023   Procedure: TENOTOMY, ACHILLES;  Surgeon: Anell Baptist, DPM;  Location: [facility];  Service: Orthopedics/Podiatry;  Laterality: Right;   AMPUTATION Left 08/19/2022   Procedure: TRANSMETATARSAL AMPUTATION LEFT FOOT WITH IRRIGATION AND DEBRIDEMENT;  Surgeon: Pink Bridges, DPM;  Location: [facility];  Service: Podiatry;  Laterality: Left;   AMPUTATION Left 05/16/2023   Procedure: AMPUTATION BELOW KNEE;  Surgeon: Celso College, MD;  Location: [facility];  Service: General;  Laterality: Left;   AMPUTATION Right 09/14/2023   Procedure: AMPUTATION, FOOT, RAY;  Surgeon: Anell Baptist, DPM;  Location: [facility];  Service: Orthopedics/Podiatry;  Laterality: Right;   AMPUTATION TOE Right 06/01/2015   Procedure: AMPUTATION TOE;  Surgeon: Sharlyn Deaner, DPM;  Location: [facility];  Service: Podiatry;  Laterality: Right;   AMPUTATION TOE Left 08/11/2022   Procedure: AMPUTATION TOE 2, 3, 4;  Surgeon: Anell Baptist, DPM;  Location: [facility];  Service: Podiatry;  Laterality: Left;   CATARACT EXTRACTION, BILATERAL     CIRCUMCISION N/A 06/12/2022   Procedure: CIRCUMCISION ADULT;  Surgeon: Dustin Gimenez, MD;  Location: [facility];  Service: Urology;  Laterality: N/A;   COLONOSCOPY WITH PROPOFOL  N/A 02/14/2016   Procedure: COLONOSCOPY WITH PROPOFOL ;  Surgeon: Deveron Fly, MD;  Location: [facility];  Service: Endoscopy;  Laterality: N/A;   COLONOSCOPY WITH  PROPOFOL  N/A 01/07/2019   Procedure: COLONOSCOPY WITH PROPOFOL ;  Surgeon: Deveron Fly, MD;  Location: [facility] ENDOSCOPY;  Service: Endoscopy;  Laterality: N/A;   CORONARY ARTERY BYPASS GRAFT N/A 08/2012   EXCISION PARTIAL PHALANX Right 06/01/2015   Procedure: EXCISION PARTIAL PHALANX /  BONE;  Surgeon: Sharlyn Deaner, DPM;  Location: [facility];  Service: Podiatry;  Laterality: Right;   FLEXIBLE SIGMOIDOSCOPY N/A 05/29/2016   Procedure: FLEXIBLE SIGMOIDOSCOPY;  Surgeon: Deveron Fly, MD;  Location: [facility] ENDOSCOPY;  Service: Endoscopy;  Laterality: N/A;   INCISION AND DRAINAGE Left 08/23/2022   Procedure: INCISION AND DRAINAGE;  Surgeon: Anell Baptist, DPM;  Location: [facility];  Service: Podiatry;  Laterality:  Left;   INCISION AND DRAINAGE OF WOUND Left 09/15/2022   Procedure: 11044 - DEBRIDE BONE and EXCISION IF 1ST METATARSAL BONE WITH  DELAY PRIMARY CLOSURE;  Surgeon: Anell Baptist, DPM;  Location: [facility];  Service: Podiatry;  Laterality: Left;   IRRIGATION AND DEBRIDEMENT FOOT Left 02/28/2023   Procedure: IRRIGATION AND DEBRIDEMENT FOOT;  Surgeon: Anell Baptist, DPM;  Location: [facility];  Service: Orthopedics/Podiatry;  Laterality: Left;   KNEE ARTHROSCOPY Left    LOWER EXTREMITY ANGIOGRAPHY Left 08/22/2022   Procedure: Lower Extremity Angiography;  Surgeon: Jackquelyn Mass, MD;  Location: [facility];  Service: Cardiovascular;  Laterality: Left;   LOWER EXTREMITY ANGIOGRAPHY Left 05/11/2023   Procedure: Lower Extremity Angiography;  Surgeon: Celso College, MD;  Location: [facility];  Service: Cardiovascular;  Laterality: Left;   LOWER EXTREMITY ANGIOGRAPHY Right 09/11/2023   Procedure: Lower Extremity Angiography;  Surgeon: Jackquelyn Mass, MD;  Location: [facility];  Service: Cardiovascular;  Laterality: Right;   LOWER EXTREMITY INTERVENTION Right 09/11/2023   Procedure: LOWER EXTREMITY INTERVENTION;  Surgeon: Jackquelyn Mass, MD;  Location: [facility];  Service: Cardiovascular;  Laterality: Right;   TEE WITHOUT CARDIOVERSION N/A 03/01/2023   Procedure: TRANSESOPHAGEAL ECHOCARDIOGRAM;  Surgeon: Alluri, Odessa Bene, MD;  Location: [facility];  Service: Cardiovascular;  Laterality: N/A;   WOUND DEBRIDEMENT Left 09/15/2022   Procedure: 11043 - DEBRIDE SKIN. MUSCLE FASCIA;  Surgeon: Anell Baptist, DPM;  Location: [facility];  Service: Podiatry;  Laterality: Left;    Social History Social History   Tobacco Use   Smoking status: Former    Types: Cigars   Smokeless tobacco: Never  Vaping Use   Vaping status: Never Used  Substance Use Topics   Alcohol use: No   Drug use: No    Family History Family History  Problem Relation Age of Onset   Diabetes Mellitus II Mother    CAD Mother     No Known Allergies   REVIEW OF SYSTEMS (Negative unless checked)  Constitutional: [] Weight loss  [] Fever  [] Chills Cardiac: [] Chest pain   [] Chest pressure   [] Palpitations   [] Shortness of breath when laying flat   [] Shortness of breath with exertion. Vascular:  [x] Pain in legs with walking   [] Pain in legs at rest  [] History of DVT   [] Phlebitis   [] Swelling in legs   [] Varicose veins   [] Non-healing ulcers Pulmonary:   [] Uses home oxygen   [] Productive cough   [] Hemoptysis   [] Wheeze  [] COPD   [] Asthma Neurologic:  [] Dizziness   [] Seizures   [] History of stroke   [] History of TIA  [] Aphasia   [] Vissual changes   [] Weakness or numbness in arm   [] Weakness or numbness in leg Musculoskeletal:   [] Joint swelling   [] Joint pain   [] Low back pain Hematologic:  [] Easy bruising  [] Easy bleeding   [] Hypercoagulable state   [] Anemic Gastrointestinal:  [] Diarrhea   [] Vomiting  [] Gastroesophageal reflux/heartburn   [] Difficulty swallowing. Genitourinary:  [] Chronic kidney disease   [] Difficult urination  [] Frequent urination   [] Blood in urine Skin:  [] Rashes   [] Ulcers  Psychological:  [] History of anxiety   []  History of major  depression.  Physical Examination  Vitals:   10/08/23 1528  BP: 117/77  Pulse: 71  Resp: 16   There is no height or weight on file to calculate BMI. Gen: WD/WN, NAD Head: [place]/AT, No temporalis wasting.  Ear/Nose/Throat: Hearing grossly intact, nares w/o erythema or  drainage Eyes: PER, EOMI, sclera nonicteric.  Neck: Supple, no masses.  No bruit or JVD.  Pulmonary:  Good air movement, no audible wheezing, no use of accessory muscles.  Cardiac: RRR, normal S1, S2, no Murmurs. Vascular:  mild trophic changes, no open wounds Vessel Right Left  Radial Palpable Palpable  PT Not Palpable BKA  DP Not Palpable BKA  Gastrointestinal: soft, non-distended. No guarding/no peritoneal signs.  Musculoskeletal: M/S 5/5 throughout.  No visible deformity.  Neurologic: CN 2-12 intact. Pain and light touch intact in extremities.  Symmetrical.  Speech is fluent. Motor exam as listed above. Psychiatric: Judgment intact, Mood & affect appropriate for pt's clinical situation. Dermatologic: No rashes or ulcers noted.  No changes consistent with cellulitis.   CBC Lab Results  Component Value Date   WBC 5.8 05/27/2023   HGB 10.8 (L) 05/27/2023   HCT 32.7 (L) 05/27/2023   MCV 86.3 05/27/2023   PLT 284 05/27/2023    BMET    Component Value Date/Time   NA 137 05/18/2023 0720   K 3.7 05/18/2023 0720   CL 108 05/18/2023 0720   CO2 21 (L) 05/18/2023 0720   GLUCOSE 172 (H) 05/18/2023 0720   GLUCOSE 402 (H) 09/17/2012 1417   BUN 11 09/11/2023 1031   CREATININE 1.35 (H) 09/11/2023 1031   CALCIUM 8.1 (L) 05/18/2023 0720   GFRNONAA 55 (L) 09/11/2023 1031   GFRAA 58 (L) 06/02/2015 0656   CrCl cannot be calculated (Patient's most recent lab result is older than the maximum 21 days allowed.).  COAG Lab Results  Component Value Date   INR 1.3 (H) 05/10/2023   INR 1.6 (H) 02/27/2023   INR 1.6 (H) 02/26/2023    Radiology VAS US  ABI WITH/WO TBI Result Date: 10/08/2023  LOWER EXTREMITY DOPPLER  STUDY Patient Name:  LAURENCE CROFFORD  Date of Exam:   10/08/2023 Medical Rec #: [id number]    Accession #:    [id number] Date of Birth: 1950/05/30     Patient Gender: M Patient Age:   26 years Exam Location:  [facility] Procedure:      VAS US  ABI WITH/WO TBI Referring Phys: Devon Fogo --------------------------------------------------------------------------------  Indications: Peripheral artery disease. High Risk Factors: Hypertension, hyperlipidemia, coronary artery disease.  Vascular Interventions: Left AT PTA 05/11/2023                         Left BKA 05/16/2023                          09/11/2023: PTA Rt Mid Popliteal Artery. PTA and Stent                         Rt ATA. PTA Rt Tibioperoneal trunk. Comparison Study: 06/07/2023 Performing Technologist: Tonie Franks RVS  Examination Guidelines: A complete evaluation includes at minimum, Doppler waveform signals and systolic blood pressure reading at the level of bilateral brachial, anterior tibial, and posterior tibial arteries, when vessel segments are accessible. Bilateral testing is considered an integral part of a complete examination. Photoelectric Plethysmograph (PPG) waveforms and toe systolic pressure readings are included as required and additional duplex testing as needed. Limited examinations for reoccurring indications may be performed as noted.  ABI Findings: +---------+------------------+-----+--------+--------+ Right    Rt Pressure (mmHg)IndexWaveformComment  +---------+------------------+-----+--------+--------+ Brachial 132                                     +---------+------------------+-----+--------+--------+  ATA      122               0.89 biphasic         +---------+------------------+-----+--------+--------+ PTA      236               1.72 biphasic         +---------+------------------+-----+--------+--------+ Great Toe45                0.33 Abnormal          +---------+------------------+-----+--------+--------+ +--------+------------------+-----+--------+-------+ Left    Lt Pressure (mmHg)IndexWaveformComment +--------+------------------+-----+--------+-------+ [id number]                                    +--------+------------------+-----+--------+-------+ +-------+-----------+-----------+------------+------------+ ABI/TBIToday's ABIToday's TBIPrevious ABIPrevious TBI +-------+-----------+-----------+------------+------------+ Right  >1.0 [place]    .33        2.01        0.0          +-------+-----------+-----------+------------+------------+  Right ABIs appear essentially unchanged compared to prior study on 06/07/2023. Right TBIs appear increased compared to prior study on 06/07/2023.  Summary: Right: Resting right ankle-brachial index indicates noncompressible right lower extremity arteries. The right toe-brachial index is abnormal. *See table(s) above for measurements and observations.  Electronically signed by Devon Fogo MD on 10/08/2023 at 5:29:11 PM.    Final    DG MINI C-ARM IMAGE ONLY Result Date: 09/14/2023 There is no interpretation for this exam.  This order is for images obtained during a surgical procedure.  Please See Surgeries Tab for more information regarding the procedure.   PERIPHERAL VASCULAR CATHETERIZATION Result Date: 09/11/2023 See surgical note for result.    Assessment/Plan 1. Atherosclerosis of native arteries of the extremities with ulceration (HCC) (Primary) Recommend:  The patient is status post successful angiogram with intervention.  The patient reports that the claudication symptoms and leg pain has improved.   The patient denies lifestyle limiting changes at this point in time.  No further invasive studies, angiography or surgery at this time. The patient should continue walking and begin a more formal exercise program.  The patient should continue antiplatelet therapy and aggressive  treatment of the lipid abnormalities  Continued surveillance is indicated as atherosclerosis is likely to progress with time.    The patient will follow-up with me in 1 month no studies.  This will be primarily a wound check to ensure that no further interventions are needed  2. Benign essential hypertension Continue antihypertensive medications as already ordered, these medications have been reviewed and there are no changes at this time.  3. 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  Continue statin as ordered and reviewed, no changes at this time  Nitrates PRN for chest pain  4. Longstanding persistent atrial fibrillation (HCC) Continue antiarrhythmia medications as already ordered, these medications have been reviewed and there are no changes at this time.  Continue anticoagulation as ordered by Cardiology Service  5. Controlled type 2 diabetes mellitus with neuropathy (HCC) Continue hypoglycemic medications as already ordered, these medications have been reviewed and there are no changes at this time.  Hgb A1C to be monitored as already arranged by primary service     Devon Fogo, MD  10/10/2023 1:59 PM

## 2023-10-23 NOTE — Progress Notes (Signed)
 Chief Complaint:   Chief Complaint  Patient presents with  . Follow-up    Recheck right foot ulcer.     Subjective  HPI  Trace is a 73 y.o. male who presents for Follow-up (Recheck right foot ulcer. ) History of Present Illness Andrew Harding is a 73 year old male with a history of foot issues who presents with a non-healing wound on his foot.  He has a non-healing wound on his foot with necrotic tissue and some granulation tissue forming around it. He manages the wound with daily dressing changes using calcium alginate with silver.  He is concerned about the need for additional antibiotics and questions the necessity of a wound culture, especially in a deeper area of the wound.  He has previously consulted with vascular surgeons who confirmed adequate blood flow, and he has a follow-up appointment scheduled in about a month.  He is currently out of antibiotics and is worried about the potential need for further surgical intervention, including a transmetatarsal amputation, which may impact his mobility and ability to return to work. He may require an assistive device like a cane for balance.  No red streaks or significant surrounding redness are noted around the wound.  Review of Systems  Patient Active Problem List  Diagnosis  . Coronary artery disease  . Mixed hyperlipidemia  . Gout  . Vitamin D  deficiency  . Benign essential hypertension  . CKD (chronic kidney disease) stage 3, GFR 30-59 ml/min (CMS/HHS-HCC)  . Status post amputation of toe of right foot (CMS/HHS-HCC)  . Diabetic polyneuropathy associated with type 2 diabetes mellitus (CMS/HHS-HCC)  . Type 2 diabetes mellitus with stage 3 chronic kidney disease, with long-term current use of insulin  (CMS/HHS-HCC)  . Type 2 diabetes mellitus with vascular disease (CMS/HHS-HCC)  . Uncontrolled type 2 diabetes mellitus with hyperglycemia, with long-term current use of insulin  (CMS/HHS-HCC)  . Paroxysmal atrial fibrillation  (CMS/HHS-HCC)  . Postural dizziness with presyncope  . S/P BKA (below knee amputation) unilateral, left (CMS/HHS-HCC)  . Atherosclerosis of native arteries of extremity with intermittent claudication ()  . Atherosclerosis of native arteries of the extremities with ulceration (CMS/HHS-HCC)    Outpatient Medications Prior to Visit  Medication Sig Dispense Refill  . apixaban  (ELIQUIS ) 5 mg tablet Take 5 mg by mouth every 12 (twelve) hours    . chlorhexidine  (HIBICLENS ) 4 % external wash Apply 1 Application topically    . clopidogreL  (PLAVIX ) 75 mg tablet Take 75 mg by mouth once daily    . gabapentin  (NEURONTIN ) 300 MG capsule Take 1 capsule (300 mg total) by mouth 3 (three) times daily 270 capsule 3  . insulin  GLARGINE (LANTUS  SOLOSTAR U-100 INSULIN ) pen injector (concentration 100 units/mL) Inject 36 Units subcutaneously at bedtime 45 mL 3  . lancets (ONETOUCH DELICA PLUS LANCET) Use 1 each 3 (three) times daily 300 each 3  . metFORMIN (GLUCOPHAGE-XR) 500 MG XR tablet Take 4 tablets (2,000 mg total) by mouth daily with dinner 360 tablet 3  . metoprolol  TARTrate (LOPRESSOR ) 25 MG tablet Take 1 tablet (25 mg total) by mouth 2 (two) times daily 180 tablet 3  . oxyCODONE -acetaminophen  (PERCOCET) 5-325 mg tablet Take 1-2 tablets by mouth    . pen needle, diabetic (BD ULTRA-FINE NANO PEN NEEDLE) 32 gauge x 5/32 Ndle 1 each once daily 100 each 1  . pravastatin  (PRAVACHOL ) 80 MG tablet Take 1 tablet (80 mg total) by mouth every evening 90 tablet 3  . semaglutide (OZEMPIC) 1 mg/dose (4 mg/3 mL)  pen injector Inject 0.75 mLs (1 mg total) subcutaneously once a week 9 mL 3  . traZODone  (DESYREL ) 50 MG tablet Take 1 tablet (50 mg total) by mouth at bedtime as needed 90 tablet 3  . flash glucose scanning (FREESTYLE LIBRE 2 READER) reader Use 1 Device as directed (Patient not taking: Reported on 07/23/2023) 1 each 1  . flash glucose sensor (FREESTYLE LIBRE 2 SENSOR) kit USE ONE (1) SENSOR EVERY 14 DAYS FOR  GLUCOSE MONITORING AS DIRECTED (Patient not taking: Reported on 10/02/2023) 6 kit 3   No facility-administered medications prior to visit.      Objective  Vitals:   10/23/23 1413  BP: (!) 142/77  Weight: 91.6 kg (201 lb 15.1 oz)  Height: 190.5 cm (6' 3)  PainSc: 0-No pain  PainLoc: Foot   Body mass index is 25.24 kg/m.  Home Vitals:     Physical Exam Physical Exam EXTREMITIES: Necrotic tissue on foot with surrounding fibrotic tissue. Good blood flow in extremities.  Unfortunately the skin flap has failed at this time.  Large amount of necrotic tissue with deep fibrotic tissue.  Probes deep into the first intermetatarsal space at this time.  The bone is probed as well.  No purulent drainage noted.  The surrounding skin does not show signs of acute infection currently.  X-rays of the right foot shows no erosive changes of the first metatarsal.  The following labs were personally reviewed: - Lab Results  Component Value Date   WBC 6.1 06/19/2023   HGB 12.1 (L) 06/19/2023   HCT 37.5 (L) 06/19/2023   PLT 207 06/19/2023   - Lab Results  Component Value Date   NA 142 06/19/2023   K 4.4 06/19/2023   CL 110 (H) 06/19/2023   CO2 17.0 (L) 06/19/2023   BUN 15 06/19/2023   CREATININE 1.1 06/19/2023   GLUCOSE 117 (H) 06/19/2023   - Lab Results  Component Value Date   NA 142 06/19/2023   K 4.4 06/19/2023   CL 110 (H) 06/19/2023   CO2 17.0 (L) 06/19/2023   BUN 15 06/19/2023   CREATININE 1.1 06/19/2023   CALCIUM 8.8 06/19/2023   TP 6.5 09/17/2012   ALB 4.2 06/19/2023   TBILI 0.4 06/19/2023   ALKPHOS 53 06/19/2023   AST 19 06/19/2023   ALT 14 06/19/2023   GLUCOSE 117 (H) 06/19/2023   GFR 71 06/19/2023   - Lab Results  Component Value Date   HGBA1C 6.8 (H) 06/19/2023      Results Procedure: Debridement of necrotic tissue Description: Removal of necrotic and fibrotic tissue from the wound. The surrounding tissue was assessed, and a deep culture was taken from a  deeper area of the wound.     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 The ulcer is deep, with insufficient surrounding tissue for closure, and may require surgical intervention. Transmetatarsal amputation is considered to facilitate wound closure and potentially improve ambulation, though it may necessitate lifelong use of an assistive device for balance. Alternative treatments, such as wound vac and skin grafting, depend on x-ray findings to determine bone integrity. The decision will be based on the x-ray results and the potential to salvage the foot while ensuring weight-bearing capability. - Debride necrotic tissue from the ulcer. - Order x-ray of the right foot to assess bone condition. - Perform deep culture of  the ulcer to check for infection. - Continue daily dressing changes with calcium alginate with silver. - Submit form to replenish dressing supply. - Consider transmetatarsal amputation if x-ray indicates insufficient bone for closure. - Discuss potential use of wound vac and skin grafting if bone is intact. - After long discussion the final plan is to have him come back next week.  I am going to keep him on antibiotics for a little bit longer until we can get him admitted.  He has got a need to undergo debridement and likely transmetatarsal amputation of this right foot.  He will likely need to be admitted as skilled rehab will most likely be needed given the fact he has a prosthetic on his left side and will need to be nonweightbearing on the right foot.    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   This note has been created using automated tools and reviewed for accuracy by JUSTIN ALLEN FOWLER.

## 2023-11-01 ENCOUNTER — Other Ambulatory Visit: Payer: Self-pay

## 2023-11-01 ENCOUNTER — Inpatient Hospital Stay
Admission: EM | Admit: 2023-11-01 | Discharge: 2023-11-08 | DRG: 271 | Disposition: A | Discharge status: Skilled Nursing Facility | Attending: Internal Medicine | Admitting: Internal Medicine

## 2023-11-01 DIAGNOSIS — E7800 Pure hypercholesterolemia, unspecified: Secondary | ICD-10-CM | POA: Diagnosis present

## 2023-11-01 DIAGNOSIS — L97519 Non-pressure chronic ulcer of other part of right foot with unspecified severity: Secondary | ICD-10-CM | POA: Diagnosis present

## 2023-11-01 DIAGNOSIS — I10 Essential (primary) hypertension: Secondary | ICD-10-CM | POA: Diagnosis present

## 2023-11-01 DIAGNOSIS — E11621 Type 2 diabetes mellitus with foot ulcer: Secondary | ICD-10-CM | POA: Diagnosis present

## 2023-11-01 DIAGNOSIS — I739 Peripheral vascular disease, unspecified: Secondary | ICD-10-CM | POA: Diagnosis not present

## 2023-11-01 DIAGNOSIS — L97516 Non-pressure chronic ulcer of other part of right foot with bone involvement without evidence of necrosis: Secondary | ICD-10-CM | POA: Diagnosis not present

## 2023-11-01 DIAGNOSIS — B9562 Methicillin resistant Staphylococcus aureus infection as the cause of diseases classified elsewhere: Secondary | ICD-10-CM | POA: Diagnosis present

## 2023-11-01 DIAGNOSIS — E1169 Type 2 diabetes mellitus with other specified complication: Secondary | ICD-10-CM | POA: Diagnosis present

## 2023-11-01 DIAGNOSIS — Z7984 Long term (current) use of oral hypoglycemic drugs: Secondary | ICD-10-CM | POA: Diagnosis not present

## 2023-11-01 DIAGNOSIS — M109 Gout, unspecified: Secondary | ICD-10-CM | POA: Diagnosis present

## 2023-11-01 DIAGNOSIS — M869 Osteomyelitis, unspecified: Secondary | ICD-10-CM | POA: Diagnosis present

## 2023-11-01 DIAGNOSIS — I4891 Unspecified atrial fibrillation: Secondary | ICD-10-CM | POA: Diagnosis present

## 2023-11-01 DIAGNOSIS — Z951 Presence of aortocoronary bypass graft: Secondary | ICD-10-CM

## 2023-11-01 DIAGNOSIS — L089 Local infection of the skin and subcutaneous tissue, unspecified: Secondary | ICD-10-CM | POA: Diagnosis not present

## 2023-11-01 DIAGNOSIS — Z89512 Acquired absence of left leg below knee: Secondary | ICD-10-CM

## 2023-11-01 DIAGNOSIS — D509 Iron deficiency anemia, unspecified: Secondary | ICD-10-CM | POA: Diagnosis present

## 2023-11-01 DIAGNOSIS — Z794 Long term (current) use of insulin: Secondary | ICD-10-CM

## 2023-11-01 DIAGNOSIS — M86171 Other acute osteomyelitis, right ankle and foot: Secondary | ICD-10-CM | POA: Diagnosis present

## 2023-11-01 DIAGNOSIS — M86071 Acute hematogenous osteomyelitis, right ankle and foot: Secondary | ICD-10-CM | POA: Diagnosis not present

## 2023-11-01 DIAGNOSIS — E11628 Type 2 diabetes mellitus with other skin complications: Secondary | ICD-10-CM | POA: Diagnosis present

## 2023-11-01 DIAGNOSIS — T82868A Thrombosis of vascular prosthetic devices, implants and grafts, initial encounter: Secondary | ICD-10-CM | POA: Diagnosis not present

## 2023-11-01 DIAGNOSIS — Z7902 Long term (current) use of antithrombotics/antiplatelets: Secondary | ICD-10-CM | POA: Diagnosis not present

## 2023-11-01 DIAGNOSIS — Z89421 Acquired absence of other right toe(s): Secondary | ICD-10-CM

## 2023-11-01 DIAGNOSIS — I2510 Atherosclerotic heart disease of native coronary artery without angina pectoris: Secondary | ICD-10-CM | POA: Diagnosis present

## 2023-11-01 DIAGNOSIS — Z87891 Personal history of nicotine dependence: Secondary | ICD-10-CM | POA: Diagnosis not present

## 2023-11-01 DIAGNOSIS — Z833 Family history of diabetes mellitus: Secondary | ICD-10-CM | POA: Diagnosis not present

## 2023-11-01 DIAGNOSIS — Z9842 Cataract extraction status, left eye: Secondary | ICD-10-CM

## 2023-11-01 DIAGNOSIS — D519 Vitamin B12 deficiency anemia, unspecified: Secondary | ICD-10-CM | POA: Diagnosis present

## 2023-11-01 DIAGNOSIS — Z79899 Other long term (current) drug therapy: Secondary | ICD-10-CM

## 2023-11-01 DIAGNOSIS — Z7985 Long-term (current) use of injectable non-insulin antidiabetic drugs: Secondary | ICD-10-CM | POA: Diagnosis not present

## 2023-11-01 DIAGNOSIS — D508 Other iron deficiency anemias: Secondary | ICD-10-CM | POA: Diagnosis not present

## 2023-11-01 DIAGNOSIS — N179 Acute kidney failure, unspecified: Secondary | ICD-10-CM | POA: Diagnosis present

## 2023-11-01 DIAGNOSIS — E1142 Type 2 diabetes mellitus with diabetic polyneuropathy: Secondary | ICD-10-CM | POA: Diagnosis present

## 2023-11-01 DIAGNOSIS — I480 Paroxysmal atrial fibrillation: Secondary | ICD-10-CM | POA: Diagnosis present

## 2023-11-01 DIAGNOSIS — I70235 Atherosclerosis of native arteries of right leg with ulceration of other part of foot: Secondary | ICD-10-CM | POA: Diagnosis not present

## 2023-11-01 DIAGNOSIS — Z95820 Peripheral vascular angioplasty status with implants and grafts: Secondary | ICD-10-CM | POA: Diagnosis not present

## 2023-11-01 DIAGNOSIS — M86172 Other acute osteomyelitis, left ankle and foot: Secondary | ICD-10-CM

## 2023-11-01 DIAGNOSIS — E1151 Type 2 diabetes mellitus with diabetic peripheral angiopathy without gangrene: Secondary | ICD-10-CM | POA: Diagnosis not present

## 2023-11-01 DIAGNOSIS — Z89411 Acquired absence of right great toe: Secondary | ICD-10-CM | POA: Diagnosis not present

## 2023-11-01 DIAGNOSIS — E1152 Type 2 diabetes mellitus with diabetic peripheral angiopathy with gangrene: Secondary | ICD-10-CM | POA: Diagnosis present

## 2023-11-01 DIAGNOSIS — Z7901 Long term (current) use of anticoagulants: Secondary | ICD-10-CM

## 2023-11-01 DIAGNOSIS — E1159 Type 2 diabetes mellitus with other circulatory complications: Secondary | ICD-10-CM | POA: Diagnosis not present

## 2023-11-01 DIAGNOSIS — Z635 Disruption of family by separation and divorce: Secondary | ICD-10-CM

## 2023-11-01 DIAGNOSIS — Z9841 Cataract extraction status, right eye: Secondary | ICD-10-CM

## 2023-11-01 DIAGNOSIS — Z9889 Other specified postprocedural states: Secondary | ICD-10-CM | POA: Diagnosis not present

## 2023-11-01 DIAGNOSIS — Z8249 Family history of ischemic heart disease and other diseases of the circulatory system: Secondary | ICD-10-CM

## 2023-11-01 DIAGNOSIS — B952 Enterococcus as the cause of diseases classified elsewhere: Secondary | ICD-10-CM | POA: Diagnosis not present

## 2023-11-01 DIAGNOSIS — T8789 Other complications of amputation stump: Secondary | ICD-10-CM | POA: Diagnosis not present

## 2023-11-01 DIAGNOSIS — D510 Vitamin B12 deficiency anemia due to intrinsic factor deficiency: Secondary | ICD-10-CM | POA: Diagnosis not present

## 2023-11-01 LAB — CBC
HCT: 37.9 % — ABNORMAL LOW (ref 39.0–52.0)
Hemoglobin: 12.5 g/dL — ABNORMAL LOW (ref 13.0–17.0)
MCH: 29.3 pg (ref 26.0–34.0)
MCHC: 33 g/dL (ref 30.0–36.0)
MCV: 88.8 fL (ref 80.0–100.0)
Platelets: 285 10*3/uL (ref 150–400)
RBC: 4.27 MIL/uL (ref 4.22–5.81)
RDW: 14.8 % (ref 11.5–15.5)
WBC: 7.6 10*3/uL (ref 4.0–10.5)
nRBC: 0 % (ref 0.0–0.2)

## 2023-11-01 LAB — BASIC METABOLIC PANEL WITH GFR
Anion gap: 11 (ref 5–15)
BUN: 23 mg/dL (ref 8–23)
CO2: 21 mmol/L — ABNORMAL LOW (ref 22–32)
Calcium: 9.9 mg/dL (ref 8.9–10.3)
Chloride: 106 mmol/L (ref 98–111)
Creatinine, Ser: 1.76 mg/dL — ABNORMAL HIGH (ref 0.61–1.24)
GFR, Estimated: 40 mL/min — ABNORMAL LOW (ref >60)
Glucose, Bld: 113 mg/dL — ABNORMAL HIGH (ref 70–99)
Potassium: 5.1 mmol/L (ref 3.5–5.1)
Sodium: 138 mmol/L (ref 135–145)

## 2023-11-01 LAB — GLUCOSE, CAPILLARY
Glucose-Capillary: 91 mg/dL (ref 70–99)
Glucose-Capillary: 118 mg/dL — ABNORMAL HIGH (ref 70–99)

## 2023-11-01 LAB — LACTIC ACID, PLASMA: Lactic Acid, Venous: 1.7 mmol/L (ref 0.5–1.9)

## 2023-11-01 LAB — HEMOGLOBIN A1C
Hgb A1c MFr Bld: 5.7 % — ABNORMAL HIGH (ref 4.8–5.6)
Mean Plasma Glucose: 116.89 mg/dL

## 2023-11-01 MED ORDER — INSULIN GLARGINE-YFGN 100 UNIT/ML ~~LOC~~ SOLN
28.0000 [IU] | Freq: Every day | SUBCUTANEOUS | Status: DC
  Administered 2023-11-01 – 2023-11-07 (×7): 28 [IU] via SUBCUTANEOUS
  Filled 2023-11-01 (×8): qty 0.28

## 2023-11-01 MED ORDER — VANCOMYCIN HCL IN DEXTROSE 1-5 GM/200ML-% IV SOLN
1000.0000 mg | Freq: Once | INTRAVENOUS | Status: AC
  Administered 2023-11-01: 1000 mg via INTRAVENOUS
  Filled 2023-11-01: qty 200

## 2023-11-01 MED ORDER — POVIDONE-IODINE 10 % EX SWAB: 2.0000 | Freq: Once | CUTANEOUS | Status: DC

## 2023-11-01 MED ORDER — VANCOMYCIN HCL 1250 MG/250ML IV SOLN
1250.0000 mg | INTRAVENOUS | Status: DC
  Filled 2023-11-01: qty 250

## 2023-11-01 MED ORDER — SODIUM CHLORIDE 0.9 % IV SOLN
2.0000 g | INTRAVENOUS | Status: DC
  Administered 2023-11-01: 2 g via INTRAVENOUS
  Filled 2023-11-01 (×2): qty 12.5

## 2023-11-01 MED ORDER — GABAPENTIN 300 MG PO CAPS
300.0000 mg | ORAL_CAPSULE | Freq: Three times a day (TID) | ORAL | Status: DC
  Administered 2023-11-01 – 2023-11-08 (×19): 300 mg via ORAL
  Filled 2023-11-01 (×20): qty 1

## 2023-11-01 MED ORDER — SODIUM CHLORIDE 0.9 % IV SOLN
3.0000 g | Freq: Once | INTRAVENOUS | Status: AC
  Administered 2023-11-01: 3 g via INTRAVENOUS
  Filled 2023-11-01: qty 8

## 2023-11-01 MED ORDER — SODIUM CHLORIDE 0.9 % IV BOLUS
1000.0000 mL | Freq: Once | INTRAVENOUS | Status: AC
  Administered 2023-11-01: 1000 mL via INTRAVENOUS

## 2023-11-01 MED ORDER — ACETAMINOPHEN 325 MG PO TABS: 325.0000 mg | ORAL_TABLET | ORAL | Status: DC | PRN

## 2023-11-01 MED ORDER — METOPROLOL TARTRATE 25 MG PO TABS
25.0000 mg | ORAL_TABLET | Freq: Two times a day (BID) | ORAL | Status: DC
  Administered 2023-11-01 – 2023-11-08 (×12): 25 mg via ORAL
  Filled 2023-11-01 (×12): qty 1

## 2023-11-01 MED ORDER — TRAZODONE HCL 50 MG PO TABS
50.0000 mg | ORAL_TABLET | Freq: Every day | ORAL | Status: DC
  Administered 2023-11-01: 50 mg via ORAL
  Filled 2023-11-01: qty 1

## 2023-11-01 MED ORDER — CHLORHEXIDINE GLUCONATE 4 % EX SOLN: 60.0000 mL | Freq: Once | CUTANEOUS | Status: DC

## 2023-11-01 MED ORDER — OXYCODONE HCL 5 MG PO TABS
5.0000 mg | ORAL_TABLET | Freq: Four times a day (QID) | ORAL | Status: DC | PRN
  Administered 2023-11-05 – 2023-11-07 (×2): 5 mg via ORAL
  Filled 2023-11-01 (×2): qty 1

## 2023-11-01 MED ORDER — DOCUSATE SODIUM 100 MG PO CAPS: 100.0000 mg | ORAL_CAPSULE | Freq: Two times a day (BID) | ORAL | Status: DC | PRN

## 2023-11-01 MED ORDER — INSULIN ASPART 100 UNIT/ML IJ SOLN
0.0000 [IU] | Freq: Every day | INTRAMUSCULAR | Status: DC
  Administered 2023-11-05: 2 [IU] via SUBCUTANEOUS
  Filled 2023-11-01: qty 1

## 2023-11-01 MED ORDER — INSULIN ASPART 100 UNIT/ML IJ SOLN
0.0000 [IU] | Freq: Three times a day (TID) | INTRAMUSCULAR | Status: DC
  Administered 2023-11-02: 5 [IU] via SUBCUTANEOUS
  Administered 2023-11-03 – 2023-11-04 (×4): 2 [IU] via SUBCUTANEOUS
  Administered 2023-11-05: 3 [IU] via SUBCUTANEOUS
  Administered 2023-11-06: 2 [IU] via SUBCUTANEOUS
  Administered 2023-11-06: 3 [IU] via SUBCUTANEOUS
  Administered 2023-11-07 (×2): 2 [IU] via SUBCUTANEOUS
  Administered 2023-11-07: 3 [IU] via SUBCUTANEOUS
  Administered 2023-11-08 (×2): 2 [IU] via SUBCUTANEOUS
  Filled 2023-11-01 (×13): qty 1

## 2023-11-01 MED ORDER — PRAVASTATIN SODIUM 20 MG PO TABS: 80.0000 mg | ORAL_TABLET | Freq: Every day | ORAL | Status: DC

## 2023-11-01 MED ORDER — ENOXAPARIN SODIUM 100 MG/ML IJ SOSY
1.0000 mg/kg | PREFILLED_SYRINGE | Freq: Two times a day (BID) | INTRAMUSCULAR | Status: DC
  Administered 2023-11-01 – 2023-11-04 (×5): 85 mg via SUBCUTANEOUS
  Filled 2023-11-01 (×8): qty 1

## 2023-11-01 NOTE — Progress Notes (Signed)
 Pharmacy Antibiotic Note  Andrew Harding is a 73 y.o. male admitted on 11/01/2023 with osteomyelitis.  Pharmacy has been consulted for Vancomycin  dosing.  Plan: Vancomycin  2000 mg IV loading dose followed by Vancomycin  1250 mg IV Q 24 hrs. Goal AUC 400-550. Expected AUC: 497.3 SCr used: 1.76 Expected Cmin: 13.0   Height: 6' 3 (190.5 cm) Weight: 85.3 kg (188 lb) IBW/kg (Calculated) : 84.5  Temp (24hrs), Avg:98.2 F (36.8 C), Min:98.1 F (36.7 C), Max:98.2 F (36.8 C)  Recent Labs  Lab 11/01/23 1232  WBC 7.6  CREATININE 1.76*  LATICACIDVEN 1.7    Estimated Creatinine Clearance: 44 mL/min (A) (by C-G formula based on SCr of 1.76 mg/dL (H)).    No Known Allergies  Antimicrobials this admission: Vancomycin  7/3 >>  Cefepime  7/3 >>   Dose adjustments this admission:  Microbiology results:  Thank you for allowing pharmacy to be a part of this patient's care.  Olam Fritter, PharmD, BCPS 11/01/2023 5:33 PM

## 2023-11-01 NOTE — ED Provider Notes (Signed)
 [facility] Provider Note    Event Date/Time   First MD Initiated Contact with Patient 11/01/23 1312     (approximate)   History   Toe Pain   HPI  NIVEK POWLEY is a 73 y.o. 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  Patient reports that he went to see Dr. Ashley today and he advised that he come in for surgery tomorrow.  Patient reports that he has had a nonhealing wound for approximately 7 weeks and has been on oral antibiotics.  He is unsure which antibiotics he has been on. No fevers or chills.  Patient Active Problem List   Diagnosis Date Noted   Atherosclerosis of native arteries of the extremities with ulceration (HCC) 09/03/2023   Atherosclerosis of native arteries of extremity with intermittent claudication (HCC) 09/02/2023   S/P BKA (below knee amputation) unilateral, 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   Gram-negative bacteremia 05/11/2023   Sepsis (HCC) 05/10/2023   Open wound of left foot with complication 05/10/2023   MSSA bacteremia 02/28/2023   Controlled type 2 diabetes mellitus with neuropathy (HCC) 02/27/2023   Postural dizziness with presyncope 02/26/2023   Diabetic peripheral neuropathy (HCC)    Wound drainage 09/02/2022   Diabetic foot infection with possible osteomyelitis, left  (HCC) 08/29/2022   S/P transmetatarsal amputation of foot, left (HCC) 08/29/2022   Acute osteomyelitis of left ankle or foot (HCC) 08/24/2022   Medication management 08/24/2022   Diabetic infection of left foot (HCC) 08/24/2022   Infection of left foot 08/19/2022   Atrial fibrillation (HCC) 08/19/2022   Cellulitis 08/17/2022   Hypotension due to hypovolemia 08/17/2022   Gout 04/05/2018   Vitamin D  deficiency, unspecified 04/05/2018   Diabetic ulcer of toe of left foot associated with  type 2 diabetes mellitus, limited to breakdown of skin (HCC) 12/21/2017   Status post amputation of toe of right foot (HCC) 11/01/2015   CKD (chronic kidney disease) stage 3, GFR 30-59 ml/min (HCC) 10/08/2015   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   Osteomyelitis (HCC) 05/31/2015   Type 2 diabetes mellitus with stage 3b chronic kidney disease, with long-term current use of insulin  (HCC) 05/31/2015   History of osteomyelitis 05/31/2015   Benign essential hypertension 09/14/2014   Coronary artery disease 09/17/2012   Mixed hyperlipidemia 09/17/2012   CAD 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          Physical Exam   Triage Vital Signs: ED Triage Vitals  Encounter Vitals Group     BP 11/01/23 1228 110/74     Girls Systolic BP Percentile --      Girls Diastolic BP Percentile --      Boys Systolic BP Percentile --      Boys Diastolic BP Percentile --      Pulse Rate 11/01/23 1228 (!) 114     Resp 11/01/23 1228 18     Temp 11/01/23 1228 98.1 F (36.7 C)     Temp src --      SpO2 11/01/23 1228 96 %     Weight 11/01/23 1225 210 lb (95.3 kg)     Height 11/01/23 1225 6' 3 (1.905 m)     Head Circumference --      Peak Flow --      Pain Score 11/01/23 1224 0  Pain Loc --      Pain Education --      Exclude from Growth Chart --     Most recent vital signs: Vitals:   11/01/23 1228  BP: 110/74  Pulse: (!) 114  Resp: 18  Temp: 98.1 F (36.7 C)  SpO2: 96%    Physical Exam Vitals and nursing note reviewed.  Constitutional:      General: Awake and alert. No acute distress.    Appearance: Normal appearance. The patient is normal weight.  HENT:     Head: Normocephalic and atraumatic.     Mouth: Mucous membranes are moist.  Eyes:     General: PERRL. Normal EOMs        Right eye: No discharge.        Left eye: No discharge.     Conjunctiva/sclera: Conjunctivae normal.  Cardiovascular:     Rate and Rhythm: Tachycardic rate.     Pulses: Normal pulses.  Pulmonary:     Effort:  Pulmonary effort is normal. No respiratory distress.     Breath sounds: Normal breath sounds.  Abdominal:     Abdomen is soft. There is no abdominal tenderness. No rebound or guarding. No distention. Musculoskeletal:        General: No swelling. Normal range of motion.     Cervical back: Normal range of motion and neck supple.  Left BKA Right lower extremity in Ace wrap and cam boot, applied by podiatry just prior to arrival Skin:    General: Skin is warm and dry.     Capillary Refill: Capillary refill takes less than 2 seconds.     Findings: No rash.  Neurological:     Mental Status: The patient is awake and alert.      ED Results / Procedures / Treatments   Labs (all labs ordered are listed, but only abnormal results are displayed) Labs Reviewed  CBC - Abnormal; Notable for the following components:      Result Value   Hemoglobin 12.5 (*)    HCT 37.9 (*)    All other components within normal limits  BASIC METABOLIC PANEL WITH GFR - Abnormal; Notable for the following components:   CO2 21 (*)    Glucose, Bld 113 (*)    Creatinine, Ser 1.76 (*)    GFR, Estimated 40 (*)    All other components within normal limits  LACTIC ACID, PLASMA  LACTIC ACID, PLASMA     EKG     RADIOLOGY     PROCEDURES:  Critical Care performed:   Procedures   MEDICATIONS ORDERED IN ED: Medications  Ampicillin-Sulbactam (UNASYN) 3 g in sodium chloride  0.9 % 100 mL IVPB (3 g Intravenous New Bag/Given 11/01/23 1411)    And  vancomycin  (VANCOCIN ) IVPB 1000 mg/200 mL premix (has no administration in time range)  sodium chloride  0.9 % bolus 1,000 mL (has no administration in time range)     IMPRESSION / MDM / ASSESSMENT AND PLAN / ED COURSE  I reviewed the triage vital signs and the nursing notes.   Differential diagnosis includes, but is not limited to, osteomyelitis, diabetic wound, poor wound healing, necrotic bone.  I reviewed the patient's chart.  Patient has been following  with Dr. Ashley with podiatry for a nonhealing wound on his foot with necrotic tissue and granulation tissue.  He has previously consulted the vascular surgeons who have confirmed adequate blood flow per Dr. Layla note.  He has had debridements of the necrotic tissue and  deep cultures.  He is planning to undergo transmetatarsal amputation after his visit today.  It appears that he is currently on Ceftin  and Hibiclens .  Patient is awake and alert, tachycardic to 114 on arrival though afebrile and normotensive.  His foot has just been wrapped by the podiatry team, he did not wish me to unwrap it.  I feel this is reasonable given that it was just evaluated by podiatry.  Labs obtained in triage are unremarkable, mild AKI.  Patient was started on broad-spectrum antibiotics and IV fluids given his tachycardia and his known foot infection.  Antibiotics ordered through osteomyelitis order set with history of MRSA.  I consulted Dr. Ashley with podiatry who agrees with antibiotics, does not need any further imaging.  He is requested admission to the hospitalist service.  I consulted hospitalist for admission.  Patient accepted by Dr.Krugh. He is scheduled for transmetatarsal foot amputation tomorrow.  Patient's presentation is most consistent with acute presentation with potential threat to life or bodily function.   Clinical Course as of 11/01/23 1452  Thu Nov 01, 2023  1337 Secure chat sent to Dr. Ashley [JP]  1408 Per Dr. Ashley, start antibiotics, no imaging needed [JP]    Clinical Course User Index [JP] Ladonna Vanorder E, PA-C     FINAL CLINICAL IMPRESSION(S) / ED DIAGNOSES   Final diagnoses:  Osteomyelitis of right foot, unspecified type [facility])     Rx / DC Orders   ED Discharge Orders     None        Note:  This document was prepared using Dragon voice recognition software and may include unintentional dictation errors.   Debraann Livingstone E, PA-C 11/01/23 1452    Viviann Pastor,  MD 11/01/23 [phone]

## 2023-11-01 NOTE — ED Triage Notes (Addendum)
 Pt comes with c/o toe infection. Pt states he has wound that has not been healing. Pt states plans for wound cleaning and surgery tomorrow  with Ashley.

## 2023-11-01 NOTE — ED Notes (Signed)
 See triage note  Presents with right foot infection  States he had a wound on right foot/toe  Not healing

## 2023-11-01 NOTE — ED Triage Notes (Signed)
 Right foot infection, sent from [facility] for ED evaluation.

## 2023-11-01 NOTE — H&P (Signed)
 History and Physical    [name] FMW:[id number] DOB: 02-01-1951 DOA: 11/01/2023 DOS: the patient was seen and examined on 11/01/2023 PCP: Fernande Ophelia [name], MD  Patient coming from: Home   Chief Complaint:  Chief Complaint  Patient presents with   Toe Pain   HPI: Andrew Harding is a 73 y.o. 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 Ashley.  Patient reports compliance with his daily dressing changes.  He denies any improvement in his drainage or appearance of the foot since being on outpatient antibiotics.  Pain is adequately controlled. Denies any other acute issues.   In the emergency department, afebrile without leukocytosis, hemodynamically stable.  Lab work fairly unremarkable.  Creatinine seems to be a little higher than baseline. Last dose of eliquis  this am. Podiatry aware pt's been on eliquis .   Admit to hospitalist service pending right transmetatarsal amputation tomorrow.  Review of Systems: Review of Systems  Constitutional:  Negative for chills, fever, malaise/fatigue and weight loss.  Respiratory:  Negative for cough and shortness of breath.   Cardiovascular:  Negative for chest pain, palpitations and leg swelling.  Gastrointestinal:  Negative for abdominal pain, constipation, diarrhea, nausea and vomiting.  Genitourinary:  Negative for dysuria, frequency and hematuria.  Musculoskeletal:  Positive for joint pain. Negative for back pain and myalgias.  Skin:  Negative for rash.  Neurological:  Negative for dizziness, tingling, tremors, focal weakness, seizures, weakness and headaches.  Psychiatric/Behavioral:  Negative for substance abuse. The patient is not nervous/anxious.      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) 09/03/2023   Atrial fibrillation with RVR (HCC) 08/19/2022   Bladder neck obstruction    Cellulitis 08/17/2022   Chronic kidney disease    Coronary artery disease    a.) s/p 4v CABG in 2014   Diabetes mellitus without complication (HCC)    Diabetic neuropathy (HCC)    Diabetic peripheral neuropathy (HCC)    Diabetic ulcer of toe of left foot associated with diabetes mellitus of other type, limited to breakdown of skin (HCC) 12/21/2017   Diverticulosis    Gout    Gram-negative bacteremia 05/11/2023   Heart murmur    History of osteomyelitis 05/31/2015   Hypercholesteremia    Hyperlipidemia    Hypertension    Hypotension due to hypovolemia 08/17/2022   Infection of left foot 08/19/2022   MSSA bacteremia 02/28/2023   Open wound of left foot with complication 05/10/2023   Osteomyelitis (HCC) 05/31/2015   Peripheral neuropathy    Postural dizziness with presyncope 02/26/2023   S/P BKA (below knee amputation) unilateral, 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   S/P transmetatarsal amputation of foot, left (HCC) 08/29/2022   Sepsis (HCC) 08/17/2022   Sepsis (HCC) 05/10/2023   Status post amputation of toe of right foot (HCC) 11/01/2015   Tubular adenoma    Vitamin D  deficiency    Past Surgical History:  Procedure Laterality Date   ACHILLES TENDON SURGERY Right 09/14/2023   Procedure: TENOTOMY, ACHILLES;  Surgeon: [name], DPM;  Location:  [facility];  Service: Orthopedics/Podiatry;  Laterality: Right;   AMPUTATION Left 08/19/2022   Procedure: TRANSMETATARSAL AMPUTATION LEFT FOOT WITH IRRIGATION AND DEBRIDEMENT;  Surgeon: Lennie Barter, DPM;  Location: [facility];  Service: Podiatry;  Laterality: Left;   AMPUTATION Left 05/16/2023   Procedure:  AMPUTATION BELOW KNEE;  Surgeon: Marea Selinda RAMAN, MD;  Location: [facility];  Service: General;  Laterality: Left;   AMPUTATION Right 09/14/2023   Procedure: AMPUTATION, FOOT, RAY;  Surgeon: [name], DPM;  Location: [facility];  Service: Orthopedics/Podiatry;  Laterality: Right;   AMPUTATION TOE Right 06/01/2015   Procedure: AMPUTATION TOE;  Surgeon: Donnice Cory, DPM;  Location: [facility];  Service: Podiatry;  Laterality: Right;   AMPUTATION TOE Left 08/11/2022   Procedure: AMPUTATION TOE 2, 3, 4;  Surgeon: [name], DPM;  Location: [facility];  Service: Podiatry;  Laterality: Left;   CATARACT EXTRACTION, BILATERAL     CIRCUMCISION N/A 06/12/2022   Procedure: CIRCUMCISION ADULT;  Surgeon: Penne Knee, MD;  Location: [facility];  Service: Urology;  Laterality: N/A;   COLONOSCOPY WITH PROPOFOL  N/A 02/14/2016   Procedure: COLONOSCOPY WITH PROPOFOL ;  Surgeon: Gladis RAYMOND Mariner, MD;  Location: [facility];  Service: Endoscopy;  Laterality: N/A;   COLONOSCOPY WITH PROPOFOL  N/A 01/07/2019   Procedure: COLONOSCOPY WITH PROPOFOL ;  Surgeon: Mariner Gladis RAYMOND, MD;  Location: [facility] ENDOSCOPY;  Service: Endoscopy;  Laterality: N/A;   CORONARY ARTERY BYPASS GRAFT N/A 08/2012   EXCISION PARTIAL PHALANX Right 06/01/2015   Procedure: EXCISION PARTIAL PHALANX /  BONE;  Surgeon: Donnice Cory, DPM;  Location: [facility];  Service: Podiatry;  Laterality: Right;   FLEXIBLE SIGMOIDOSCOPY N/A 05/29/2016   Procedure: FLEXIBLE SIGMOIDOSCOPY;  Surgeon: Gladis RAYMOND Mariner, MD;  Location: [facility];  Service: Endoscopy;  Laterality: N/A;   INCISION AND DRAINAGE Left 08/23/2022   Procedure: INCISION AND DRAINAGE;  Surgeon: [name], DPM;  Location: [facility];  Service: Podiatry;  Laterality: Left;   INCISION AND DRAINAGE OF WOUND Left 09/15/2022   Procedure: 11044 - DEBRIDE BONE and EXCISION IF 1ST METATARSAL BONE WITH  DELAY PRIMARY CLOSURE;  Surgeon: [name], DPM;  Location: [facility];  Service:  Podiatry;  Laterality: Left;   IRRIGATION AND DEBRIDEMENT FOOT Left 02/28/2023   Procedure: IRRIGATION AND DEBRIDEMENT FOOT;  Surgeon: [name], DPM;  Location: [facility];  Service: Orthopedics/Podiatry;  Laterality: Left;   KNEE ARTHROSCOPY Left    LOWER EXTREMITY ANGIOGRAPHY Left 08/22/2022   Procedure: Lower Extremity Angiography;  Surgeon: Jama Cordella MATSU, MD;  Location: [facility];  Service: Cardiovascular;  Laterality: Left;   LOWER EXTREMITY ANGIOGRAPHY Left 05/11/2023   Procedure: Lower Extremity Angiography;  Surgeon: Marea Selinda RAMAN, MD;  Location: [facility];  Service: Cardiovascular;  Laterality: Left;   LOWER EXTREMITY ANGIOGRAPHY Right 09/11/2023   Procedure: Lower Extremity Angiography;  Surgeon: Jama Cordella MATSU, MD;  Location: [facility];  Service: Cardiovascular;  Laterality: Right;   LOWER EXTREMITY INTERVENTION Right 09/11/2023   Procedure: LOWER EXTREMITY INTERVENTION;  Surgeon: Jama Cordella MATSU, MD;  Location: [facility];  Service: Cardiovascular;  Laterality: Right;   TEE WITHOUT CARDIOVERSION N/A 03/01/2023   Procedure: TRANSESOPHAGEAL ECHOCARDIOGRAM;  Surgeon: Alluri, Keller BROCKS, MD;  Location: [facility];  Service: Cardiovascular;  Laterality: N/A;   WOUND DEBRIDEMENT Left 09/15/2022   Procedure: 11043 - DEBRIDE SKIN. MUSCLE FASCIA;  Surgeon: [name], DPM;  Location: [facility];  Service: Podiatry;  Laterality: Left;   Social History:  reports that  he has quit smoking. His smoking use included cigars. He has never used smokeless tobacco. He reports that he does not drink alcohol and does not use drugs.  No Known Allergies  Family History  Problem Relation Age of Onset   Diabetes Mellitus II Mother    CAD Mother     Prior to Admission medications   Medication Sig Start Date End Date Taking? Authorizing Provider  acetaminophen  (TYLENOL ) 325 MG tablet Take 1-2 tablets (325-650 mg total) by mouth every 4 (four) hours as  needed for mild pain. 08/30/22   Love, Sharlet RAMAN, PA-C  cefUROXime  (CEFTIN ) 500 MG tablet Take 500 mg by mouth 2 (two) times daily. 09/04/23   [provider]  chlorhexidine  (HIBICLENS ) 4 % external liquid Apply 15 mLs (1 Application total) topically as directed for 30 doses. Use as directed daily for 5 days every other week for 6 weeks. 09/14/23   [name], DPM  clopidogrel  (PLAVIX ) 75 MG tablet TAKE 1 TABLET BY MOUTH EVERY DAY WITH BREAKFAST Patient not taking: No sig reported 04/24/23   Delores Orvin BRAVO, NP  Continuous Glucose Sensor (FREESTYLE LIBRE 2 SENSOR) MISC  09/05/22   [provider]  docusate sodium  (COLACE) 100 MG capsule Take 1 capsule (100 mg total) by mouth 2 (two) times daily. Patient not taking: Reported on 09/05/2023 09/05/22   Love, Sharlet RAMAN, PA-C  ELIQUIS  5 MG TABS tablet TAKE 1 TABLET(5 MG) BY MOUTH TWICE DAILY 08/27/23   Brown, Fallon E, NP  gabapentin  (NEURONTIN ) 300 MG capsule Take 1 capsule (300 mg total) by mouth 3 (three) times daily. 09/05/22   Love, Sharlet RAMAN, PA-C  glucose blood (ONETOUCH ULTRA) test strip Use 3 (three) times daily 09/09/18   [provider]  insulin  glargine (LANTUS ) 100 UNIT/ML Solostar Pen Inject 36 Units into the skin at bedtime. Patient taking differently: Inject 34 Units into the skin at bedtime. 09/05/22   Love, Sharlet RAMAN, PA-C  Insulin  Pen Needle (FIFTY50 PEN NEEDLES) 32G X 4 MM MISC USE 4 TIMES DAILY 07/29/18   [provider]  metFORMIN (GLUCOPHAGE-XR) 500 MG 24 hr tablet Take 2,000 mg by mouth daily with supper.    [provider]  metoprolol  tartrate (LOPRESSOR ) 25 MG tablet Take 1 tablet (25 mg total) by mouth 2 (two) times daily. 09/05/22   Love, Sharlet RAMAN, PA-C  ondansetron  (ZOFRAN ) 4 MG tablet Take 1 tablet (4 mg total) by mouth every 6 (six) hours as needed for nausea or vomiting. Patient not taking: Reported on 09/05/2023 03/05/23   Fausto Sor A, DO  oxyCODONE  (OXY IR/ROXICODONE ) 5 MG immediate release  tablet Take 1 tablet (5 mg total) by mouth every 6 (six) hours as needed for moderate pain (pain score 4-6). SNF use only.  Refills per SNF providers Patient not taking: Reported on 09/03/2023 05/23/23   Jhonny Calvin NOVAK, MD  oxyCODONE -acetaminophen  (PERCOCET) 5-325 MG tablet Take 1-2 tablets by mouth every 6 (six) hours as needed for severe pain (pain score 7-10). Max 6 tabs per day 09/14/23   [name], DPM  OZEMPIC, 0.25 OR 0.5 MG/DOSE, 2 MG/3ML SOPN Inject 0.75 mLs into the skin once a week. Patient not taking: Reported on 09/05/2023    [provider]  pravastatin  (PRAVACHOL ) 80 MG tablet Take 80 mg by mouth at bedtime. 07/08/18   [provider]  traZODone  (DESYREL ) 50 MG tablet Take 50 mg by mouth at bedtime. 10/21/18   [provider]    Physical Exam:  Vitals:   11/01/23 1228 11/01/23 1418 11/01/23 1530 11/01/23 1659  BP: 110/74  131/76   Pulse: (!) 114  70   Resp: 18  18   Temp: 98.1 F (36.7 C)   98.2 F (36.8 C)  TempSrc:    Oral  SpO2: 96%  100%   Weight:  85.3 kg    Height:      Physical Exam Vitals and nursing note reviewed.  Constitutional:      General: He is not in acute distress.    Appearance: He is not ill-appearing or toxic-appearing.  HENT:     Head: Normocephalic and atraumatic.     Mouth/Throat:     Mouth: Mucous membranes are moist.  Eyes:     Extraocular Movements: Extraocular movements intact.     Pupils: Pupils are equal, round, and reactive to light.  Cardiovascular:     Rate and Rhythm: Normal rate and regular rhythm.  Pulmonary:     Effort: Pulmonary effort is normal. No respiratory distress.     Breath sounds: Normal breath sounds. No wheezing.  Abdominal:     General: Abdomen is flat. Bowel sounds are normal. There is no distension.     Palpations: Abdomen is soft.     Tenderness: There is no abdominal tenderness.  Musculoskeletal:     Cervical back: Normal range of motion and neck supple.     Right lower leg: No  edema.     Comments: L BKA, prosthetic in place Right foot wrapped in immobilizer, direct examination deferred  Skin:    Capillary Refill: Capillary refill takes less than 2 seconds.  Neurological:     General: No focal deficit present.     Mental Status: He is alert.  Psychiatric:        Mood and Affect: Mood normal.        Behavior: Behavior normal.     Data Reviewed:    Labs on Admission: I have personally reviewed following labs and imaging studies  CBC: Recent Labs  Lab 11/01/23 1232  WBC 7.6  HGB 12.5*  HCT 37.9*  MCV 88.8  PLT 285   Basic Metabolic Panel: Recent Labs  Lab 11/01/23 1232  NA 138  K 5.1  CL 106  CO2 21*  GLUCOSE 113*  BUN 23  CREATININE 1.76*  CALCIUM 9.9   GFR: Estimated Creatinine Clearance: 44 mL/min (A) (by C-G formula based on SCr of 1.76 mg/dL (H)). Liver Function Tests: No results for input(s): AST, ALT, ALKPHOS, BILITOT, PROT, ALBUMIN in the last 168 hours. No results for input(s): LIPASE, AMYLASE in the last 168 hours. No results for input(s): AMMONIA in the last 168 hours. Coagulation Profile: No results for input(s): INR, PROTIME in the last 168 hours. Cardiac Enzymes: No results for input(s): CKTOTAL, CKMB, CKMBINDEX, TROPONINI in the last 168 hours. BNP (last 3 results) No results for input(s): PROBNP in the last 8760 hours. HbA1C: No results for input(s): HGBA1C in the last 72 hours. CBG: No results for input(s): GLUCAP in the last 168 hours. Lipid Profile: No results for input(s): CHOL, HDL, LDLCALC, TRIG, CHOLHDL, LDLDIRECT in the last 72 hours. Thyroid Function Tests: No results for input(s): TSH, T4TOTAL, FREET4, T3FREE, THYROIDAB in the last 72 hours. Anemia Panel: No results for input(s): VITAMINB12, FOLATE, FERRITIN, TIBC, IRON, RETICCTPCT in the last 72 hours. Urine analysis:    Component Value Date/Time   COLORURINE YELLOW (A) 05/10/2023  0937   APPEARANCEUR HAZY (A) 05/10/2023 9062  APPEARANCEUR Hazy (A) 05/30/2022 1026   LABSPEC 1.017 05/10/2023 0937   PHURINE 5.0 05/10/2023 0937   GLUCOSEU 50 (A) 05/10/2023 0937   HGBUR MODERATE (A) 05/10/2023 0937   BILIRUBINUR NEGATIVE 05/10/2023 0937   BILIRUBINUR Negative 05/30/2022 1026   KETONESUR NEGATIVE 05/10/2023 0937   PROTEINUR 100 (A) 05/10/2023 0937   UROBILINOGEN 0.2 04/05/2018 1042   NITRITE NEGATIVE 05/10/2023 0937   LEUKOCYTESUR NEGATIVE 05/10/2023 9062    Radiological Exams on Admission: No results found.    Assessment and Plan: No notes have been filed under this hospital service. 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 Ashley.  Ulcer of right foot with bone involvement  s/p right first to amputation with poor wound healing  - Management largely per podiatry, plan for OR tomorrow - hold eliquis  and plavix  - N.p.o. midnight - Pain control - optimize glucose control  -- cefepime /vancomycin  for now   CAD s/p CABG PAF on eliquis   - hold eliquis  and plavix . Last dose this am.   - anticoagulate with lovenox  tonight  - cont metoprolol  and statin  IDT2DM - cont at bedtime lantus   - SSI   Consistent carb diet  No IVF  Monitor/replace electrolytes  Fully anticoagulated  Consistent carb diet, NPO midnight    Advance Care Planning:   Code Status: Full Code discussed with patient at time of admission  Consults: Podiatry     Severity of Illness: The appropriate patient status for this patient is INPATIENT. Inpatient status is judged to be reasonable and necessary in order to provide the required intensity of service to ensure the patient's safety. The patient's presenting symptoms, physical exam findings, and initial radiographic and laboratory data in the context of their chronic comorbidities is felt to place them at high risk for further clinical  deterioration. Furthermore, it is not anticipated that the patient will be medically stable for discharge from the hospital within 2 midnights of admission.   * I certify that at the point of admission it is my clinical judgment that the patient will require inpatient hospital care spanning beyond 2 midnights from the point of admission due to high intensity of service, high risk for further deterioration and high frequency of surveillance required.*  Author: Daved JAYSON Pump, DO 11/01/2023 5:26 PM  For on call review [email or web address].

## 2023-11-01 NOTE — Consult Note (Signed)
 ORTHOPAEDIC CONSULTATION  REQUESTING PHYSICIAN: Krugh, Marissa C, DO  Chief Complaint: Worsening infection right foot  HPI: Andrew Harding is a 73 y.o. male who complains of worsening gangrenous changes and wound to his right foot.  He is been seen in the outpatient come by myself.  We previously performed a partial first ray amputation due to infection to the right foot.  Unfortunately dehiscence and necrosis has occurred to this area.  He is in need of further debridement of this right foot to attempt for primary closure of this wound.  He is also been seen by vascular surgery in the past.  Will likely need vascular evaluation and recommendations going forward as well.  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) 09/03/2023   Atrial fibrillation with RVR (HCC) 08/19/2022   Bladder neck obstruction    Cellulitis 08/17/2022   Chronic kidney disease    Coronary artery disease    a.) s/p 4v CABG in 2014   Diabetes mellitus without complication (HCC)    Diabetic neuropathy (HCC)    Diabetic peripheral neuropathy (HCC)    Diabetic ulcer of toe of left foot associated with diabetes mellitus of other type, limited to breakdown of skin (HCC) 12/21/2017   Diverticulosis    Gout    Gram-negative bacteremia 05/11/2023   Heart murmur    History of osteomyelitis 05/31/2015   Hypercholesteremia    Hyperlipidemia    Hypertension    Hypotension due to hypovolemia 08/17/2022   Infection of left foot 08/19/2022   MSSA bacteremia 02/28/2023   Open wound of left foot with complication 05/10/2023   Osteomyelitis (HCC) 05/31/2015   Peripheral neuropathy    Postural dizziness with presyncope 02/26/2023   S/P BKA (below knee amputation) unilateral, 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   S/P transmetatarsal  amputation of foot, left (HCC) 08/29/2022   Sepsis (HCC) 08/17/2022   Sepsis (HCC) 05/10/2023   Status post amputation of toe of right foot (HCC) 11/01/2015   Tubular adenoma    Vitamin D  deficiency    Past Surgical History:  Procedure Laterality Date   ACHILLES TENDON SURGERY Right 09/14/2023   Procedure: TENOTOMY, ACHILLES;  Surgeon: [name], DPM;  Location: [facility];  Service: Orthopedics/Podiatry;  Laterality: Right;   AMPUTATION Left 08/19/2022   Procedure: TRANSMETATARSAL AMPUTATION LEFT FOOT WITH IRRIGATION AND DEBRIDEMENT;  Surgeon: Lennie Barter, DPM;  Location: [facility];  Service: Podiatry;  Laterality: Left;   AMPUTATION Left 05/16/2023   Procedure: AMPUTATION BELOW KNEE;  Surgeon: Marea Selinda RAMAN, MD;  Location: [facility];  Service: General;  Laterality: Left;   AMPUTATION Right 09/14/2023   Procedure: AMPUTATION, FOOT, RAY;  Surgeon: [name], DPM;  Location: [facility];  Service: Orthopedics/Podiatry;  Laterality: Right;   AMPUTATION TOE Right 06/01/2015   Procedure: AMPUTATION TOE;  Surgeon: Donnice Cory, DPM;  Location: [facility];  Service: Podiatry;  Laterality: Right;   AMPUTATION TOE Left 08/11/2022   Procedure: AMPUTATION TOE 2, 3, 4;  Surgeon: [name], DPM;  Location: [facility];  Service: Podiatry;  Laterality: Left;   CATARACT EXTRACTION, BILATERAL     CIRCUMCISION N/A 06/12/2022   Procedure: CIRCUMCISION ADULT;  Surgeon: Penne Knee, MD;  Location: [facility];  Service: Urology;  Laterality: N/A;   COLONOSCOPY WITH PROPOFOL  N/A 02/14/2016  Procedure: COLONOSCOPY WITH PROPOFOL ;  Surgeon: Gladis RAYMOND Mariner, MD;  Location: [facility] ENDOSCOPY;  Service: Endoscopy;  Laterality: N/A;   COLONOSCOPY WITH PROPOFOL  N/A 01/07/2019   Procedure: COLONOSCOPY WITH PROPOFOL ;  Surgeon: Mariner Gladis RAYMOND, MD;  Location: [facility] ENDOSCOPY;  Service: Endoscopy;  Laterality: N/A;   CORONARY ARTERY BYPASS GRAFT N/A 08/2012   EXCISION PARTIAL PHALANX Right 06/01/2015   Procedure:  EXCISION PARTIAL PHALANX /  BONE;  Surgeon: Donnice Cory, DPM;  Location: [facility];  Service: Podiatry;  Laterality: Right;   FLEXIBLE SIGMOIDOSCOPY N/A 05/29/2016   Procedure: FLEXIBLE SIGMOIDOSCOPY;  Surgeon: Gladis RAYMOND Mariner, MD;  Location: [facility] ENDOSCOPY;  Service: Endoscopy;  Laterality: N/A;   INCISION AND DRAINAGE Left 08/23/2022   Procedure: INCISION AND DRAINAGE;  Surgeon: [name], DPM;  Location: [facility];  Service: Podiatry;  Laterality: Left;   INCISION AND DRAINAGE OF WOUND Left 09/15/2022   Procedure: 11044 - DEBRIDE BONE and EXCISION IF 1ST METATARSAL BONE WITH  DELAY PRIMARY CLOSURE;  Surgeon: [name], DPM;  Location: [facility];  Service: Podiatry;  Laterality: Left;   IRRIGATION AND DEBRIDEMENT FOOT Left 02/28/2023   Procedure: IRRIGATION AND DEBRIDEMENT FOOT;  Surgeon: [name], DPM;  Location: [facility];  Service: Orthopedics/Podiatry;  Laterality: Left;   KNEE ARTHROSCOPY Left    LOWER EXTREMITY ANGIOGRAPHY Left 08/22/2022   Procedure: Lower Extremity Angiography;  Surgeon: Jama Cordella MATSU, MD;  Location: [facility];  Service: Cardiovascular;  Laterality: Left;   LOWER EXTREMITY ANGIOGRAPHY Left 05/11/2023   Procedure: Lower Extremity Angiography;  Surgeon: Marea Selinda RAMAN, MD;  Location: [facility];  Service: Cardiovascular;  Laterality: Left;   LOWER EXTREMITY ANGIOGRAPHY Right 09/11/2023   Procedure: Lower Extremity Angiography;  Surgeon: Jama Cordella MATSU, MD;  Location: [facility];  Service: Cardiovascular;  Laterality: Right;   LOWER EXTREMITY INTERVENTION Right 09/11/2023   Procedure: LOWER EXTREMITY INTERVENTION;  Surgeon: Jama Cordella MATSU, MD;  Location: [facility];  Service: Cardiovascular;  Laterality: Right;   TEE WITHOUT CARDIOVERSION N/A 03/01/2023   Procedure: TRANSESOPHAGEAL ECHOCARDIOGRAM;  Surgeon: Alluri, Keller BROCKS, MD;  Location: [facility];  Service: Cardiovascular;  Laterality: N/A;   WOUND  DEBRIDEMENT Left 09/15/2022   Procedure: 11043 - DEBRIDE SKIN. MUSCLE FASCIA;  Surgeon: [name], DPM;  Location: [facility];  Service: Podiatry;  Laterality: Left;   Social History   Socioeconomic History   Marital status: Divorced    Spouse name: Ashley,Angela C   Number of children: Not on file   Years of education: Not on file   Highest education level: Not on file  Occupational History   Not on file  Tobacco Use   Smoking status: Former    Types: Cigars   Smokeless tobacco: Never  Vaping Use   Vaping status: Never Used  Substance and Sexual Activity   Alcohol use: No   Drug use: No   Sexual activity: Never  Other Topics Concern   Not on file  Social History Narrative   Patient is legally separated and lives at home by himself. His ex -wife is his HCPOA. Neighbors and friends are his support system. He used to work for [employer]    Social Drivers of Corporate investment banker Strain: Low Risk  (12/13/2022)   Received from [facility]   Overall Financial Resource Strain (CARDIA)    Difficulty of Paying Living Expenses: Not hard at all  Food Insecurity: No Food Insecurity (  05/12/2023)   Hunger Vital Sign    Worried About Running Out of Food in the Last Year: Never true    Ran Out of Food in the Last Year: Never true  Transportation Needs: No Transportation Needs (05/12/2023)   PRAPARE - Administrator, Civil Service (Medical): No    Lack of Transportation (Non-Medical): No  Physical Activity: Not on file  Stress: Not on file  Social Connections: Moderately Isolated (05/12/2023)   Social Connection and Isolation Panel    Frequency of Communication with Friends and Family: More than three times a week    Frequency of Social Gatherings with Friends and Family: Twice a week    Attends Religious Services: More than 4 times per year    Active Member of [employer] or Organizations: No    Attends Banker Meetings:  Never    Marital Status: Divorced   Family History  Problem Relation Age of Onset   Diabetes Mellitus II Mother    CAD Mother    No Known Allergies Prior to Admission medications   Medication Sig Start Date End Date Taking? Authorizing Provider  cefUROXime  (CEFTIN ) 500 MG tablet Take 500 mg by mouth 2 (two) times daily. 09/04/23  Yes [provider]  clopidogrel  (PLAVIX ) 75 MG tablet TAKE 1 TABLET BY MOUTH EVERY DAY WITH BREAKFAST 04/24/23  Yes Brown, Fallon E, NP  ELIQUIS  5 MG TABS tablet TAKE 1 TABLET(5 MG) BY MOUTH TWICE DAILY 08/27/23  Yes Brown, Fallon E, NP  gabapentin  (NEURONTIN ) 300 MG capsule Take 1 capsule (300 mg total) by mouth 3 (three) times daily. 09/05/22  Yes Love, Sharlet RAMAN, PA-C  insulin  glargine (LANTUS ) 100 UNIT/ML Solostar Pen Inject 36 Units into the skin at bedtime. Patient taking differently: Inject 34 Units into the skin at bedtime. 09/05/22  Yes Love, Sharlet RAMAN, PA-C  metFORMIN (GLUCOPHAGE-XR) 500 MG 24 hr tablet Take 2,000 mg by mouth daily with supper.   Yes [provider]  metoprolol  tartrate (LOPRESSOR ) 25 MG tablet Take 1 tablet (25 mg total) by mouth 2 (two) times daily. 09/05/22  Yes Love, Sharlet RAMAN, PA-C  OZEMPIC, 0.25 OR 0.5 MG/DOSE, 2 MG/3ML SOPN Inject 0.75 mLs into the skin once a week.   Yes [provider]  pravastatin  (PRAVACHOL ) 80 MG tablet Take 80 mg by mouth at bedtime. 07/08/18  Yes [provider]  traZODone  (DESYREL ) 50 MG tablet Take 50 mg by mouth at bedtime. 10/21/18  Yes [provider]  acetaminophen  (TYLENOL ) 325 MG tablet Take 1-2 tablets (325-650 mg total) by mouth every 4 (four) hours as needed for mild pain. Patient not taking: Reported on 11/01/2023 08/30/22   Love, Sharlet RAMAN, PA-C  chlorhexidine  (HIBICLENS ) 4 % external liquid Apply 15 mLs (1 Application total) topically as directed for 30 doses. Use as directed daily for 5 days every other week for 6 weeks. 09/14/23   [name], DPM  Continuous  Glucose Sensor (FREESTYLE LIBRE 2 SENSOR) MISC  09/05/22   [provider]  docusate sodium  (COLACE) 100 MG capsule Take 1 capsule (100 mg total) by mouth 2 (two) times daily. Patient not taking: Reported on 09/05/2023 09/05/22   Love, Sharlet RAMAN, PA-C  glucose blood (ONETOUCH ULTRA) test strip Use 3 (three) times daily 09/09/18   [provider]  Insulin  Pen Needle (FIFTY50 PEN NEEDLES) 32G X 4 MM MISC USE 4 TIMES DAILY 07/29/18   [provider]  ondansetron  (ZOFRAN ) 4 MG tablet Take 1  tablet (4 mg total) by mouth every 6 (six) hours as needed for nausea or vomiting. Patient not taking: Reported on 09/05/2023 03/05/23   Fausto Sor A, DO  oxyCODONE  (OXY IR/ROXICODONE ) 5 MG immediate release tablet Take 1 tablet (5 mg total) by mouth every 6 (six) hours as needed for moderate pain (pain score 4-6). SNF use only.  Refills per SNF providers Patient not taking: Reported on 09/03/2023 05/23/23   Jhonny Calvin NOVAK, MD  oxyCODONE -acetaminophen  (PERCOCET) 5-325 MG tablet Take 1-2 tablets by mouth every 6 (six) hours as needed for severe pain (pain score 7-10). Max 6 tabs per day Patient not taking: Reported on 11/01/2023 09/14/23   [name], DPM   No results found.  Positive ROS: All other systems have been reviewed and were otherwise negative with the exception of those mentioned in the HPI and as above.  12 point ROS was performed.  Physical Exam: General: Alert and oriented.  No apparent distress.  Vascular:  Left foot:Status post below-knee amputation left side  Right foot: Dorsalis Pedis:  diminished Posterior Tibial:  diminished  Neuro:absent protective sensation  Derm: Necrotic ulceration to the medial aspect of the right foot along the previous first ray amputation.  Mild surrounding erythema  Ortho/MS: Status post left-sided BKA.  Status post right first ray amputation  Assessment: Diabetes with neuropathy Peripheral vascular disease Gangrene right first  ray  Plan: Patient has failed conservative treatment in the outpatient clinic.  At this point he is admitted for continued antibiotics and transmetatarsal amputation of the right foot.  He will need to be nonweightbearing on this right foot.  We had a long discussion regards to this and my recommendation is for possible SNF placement postoperatively.  He is somewhat resistant at this time.  Will plan for surgery tomorrow with transmetatarsal amputation.  The risk benefits alternatives and complications associated with surgery were discussed with the patient full consent has been given.    [name] LABOR, DPM Cell [phone]   11/01/2023 5:52 PM

## 2023-11-02 ENCOUNTER — Ambulatory Visit: Admission: RE | Admit: 2023-11-02 | Source: Home / Self Care | Admitting: Podiatry

## 2023-11-02 ENCOUNTER — Inpatient Hospital Stay: Payer: Self-pay | Admitting: Anesthesiology

## 2023-11-02 ENCOUNTER — Encounter
Admission: EM | Disposition: A | Discharge status: Skilled Nursing Facility | Payer: Self-pay | Source: Home / Self Care | Attending: Emergency Medicine

## 2023-11-02 ENCOUNTER — Other Ambulatory Visit: Payer: Self-pay

## 2023-11-02 DIAGNOSIS — L089 Local infection of the skin and subcutaneous tissue, unspecified: Secondary | ICD-10-CM | POA: Diagnosis not present

## 2023-11-02 HISTORY — PX: TRANSMETATARSAL AMPUTATION: SHX6197

## 2023-11-02 LAB — GLUCOSE, CAPILLARY
Glucose-Capillary: 93 mg/dL (ref 70–99)
Glucose-Capillary: 87 mg/dL (ref 70–99)
Glucose-Capillary: 202 mg/dL — ABNORMAL HIGH (ref 70–99)
Glucose-Capillary: 156 mg/dL — ABNORMAL HIGH (ref 70–99)

## 2023-11-02 LAB — CBC
HCT: 31.8 % — ABNORMAL LOW (ref 39.0–52.0)
Hemoglobin: 10.3 g/dL — ABNORMAL LOW (ref 13.0–17.0)
MCH: 29.1 pg (ref 26.0–34.0)
MCHC: 32.4 g/dL (ref 30.0–36.0)
MCV: 89.8 fL (ref 80.0–100.0)
Platelets: 218 K/uL (ref 150–400)
RBC: 3.54 MIL/uL — ABNORMAL LOW (ref 4.22–5.81)
RDW: 14.9 % (ref 11.5–15.5)
WBC: 5.7 K/uL (ref 4.0–10.5)
nRBC: 0 % (ref 0.0–0.2)

## 2023-11-02 LAB — BASIC METABOLIC PANEL WITH GFR
Anion gap: 10 (ref 5–15)
BUN: 18 mg/dL (ref 8–23)
CO2: 23 mmol/L (ref 22–32)
Calcium: 9 mg/dL (ref 8.9–10.3)
Chloride: 105 mmol/L (ref 98–111)
Creatinine, Ser: 1.5 mg/dL — ABNORMAL HIGH (ref 0.61–1.24)
GFR, Estimated: 49 mL/min — ABNORMAL LOW (ref >60)
Glucose, Bld: 88 mg/dL (ref 70–99)
Potassium: 4 mmol/L (ref 3.5–5.1)
Sodium: 138 mmol/L (ref 135–145)

## 2023-11-02 LAB — CK: Total CK: 38 U/L — ABNORMAL LOW (ref 49–397)

## 2023-11-02 SURGERY — AMPUTATION, FOOT, TRANSMETATARSAL
Anesthesia: General | Site: Foot | Laterality: Right

## 2023-11-02 MED ORDER — OXIDIZED CELLULOSE EX PADS
MEDICATED_PAD | CUTANEOUS | Status: DC | PRN
  Administered 2023-11-02: 1 via TOPICAL

## 2023-11-02 MED ORDER — FENTANYL CITRATE (PF) 100 MCG/2ML IJ SOLN
INTRAMUSCULAR | Status: DC | PRN
  Administered 2023-11-02: 25 ug via INTRAVENOUS

## 2023-11-02 MED ORDER — DEXAMETHASONE SODIUM PHOSPHATE 10 MG/ML IJ SOLN
INTRAMUSCULAR | Status: DC | PRN
  Administered 2023-11-02: 4 mg via INTRAVENOUS

## 2023-11-02 MED ORDER — SODIUM CHLORIDE 0.9 % IV SOLN
INTRAVENOUS | Status: DC | PRN
Start: 2023-11-02 — End: 2023-11-02

## 2023-11-02 MED ORDER — PROPOFOL 10 MG/ML IV BOLUS
INTRAVENOUS | Status: AC
Start: 2023-11-02 — End: 2023-11-02
  Filled 2023-11-02: qty 20

## 2023-11-02 MED ORDER — BUPIVACAINE HCL (PF) 0.5 % IJ SOLN
INTRAMUSCULAR | Status: DC | PRN
  Administered 2023-11-02: 20 mL

## 2023-11-02 MED ORDER — ONDANSETRON HCL 4 MG/2ML IJ SOLN
INTRAMUSCULAR | Status: DC | PRN
  Administered 2023-11-02: 4 mg via INTRAVENOUS

## 2023-11-02 MED ORDER — FENTANYL CITRATE (PF) 100 MCG/2ML IJ SOLN: 25.0000 ug | INTRAMUSCULAR | Status: DC | PRN

## 2023-11-02 MED ORDER — OXYCODONE HCL 5 MG PO TABS: 5.0000 mg | ORAL_TABLET | Freq: Once | ORAL | Status: DC | PRN

## 2023-11-02 MED ORDER — QUETIAPINE FUMARATE 25 MG PO TABS
25.0000 mg | ORAL_TABLET | Freq: Every day | ORAL | Status: AC
  Administered 2023-11-02 – 2023-11-03 (×2): 25 mg via ORAL
  Filled 2023-11-02 (×2): qty 1

## 2023-11-02 MED ORDER — ONDANSETRON HCL 4 MG/2ML IJ SOLN
INTRAMUSCULAR | Status: AC
  Filled 2023-11-02: qty 2

## 2023-11-02 MED ORDER — FENTANYL CITRATE (PF) 100 MCG/2ML IJ SOLN
INTRAMUSCULAR | Status: AC
  Filled 2023-11-02: qty 2

## 2023-11-02 MED ORDER — PROPOFOL 10 MG/ML IV BOLUS
INTRAVENOUS | Status: DC | PRN
  Administered 2023-11-02: 40 mg via INTRAVENOUS
  Administered 2023-11-02: 140 mg via INTRAVENOUS
  Administered 2023-11-02: 60 mg via INTRAVENOUS

## 2023-11-02 MED ORDER — BUPIVACAINE HCL (PF) 0.5 % IJ SOLN
INTRAMUSCULAR | Status: AC
  Filled 2023-11-02: qty 30

## 2023-11-02 MED ORDER — SUCCINYLCHOLINE CHLORIDE 200 MG/10ML IV SOSY
PREFILLED_SYRINGE | INTRAVENOUS | Status: DC | PRN
Start: 2023-11-02 — End: 2023-11-02
  Administered 2023-11-02: 100 mg via INTRAVENOUS

## 2023-11-02 MED ORDER — DEXAMETHASONE SODIUM PHOSPHATE 10 MG/ML IJ SOLN
INTRAMUSCULAR | Status: AC
  Filled 2023-11-02: qty 1

## 2023-11-02 MED ORDER — VANCOMYCIN HCL 1500 MG/300ML IV SOLN
1500.0000 mg | INTRAVENOUS | Status: DC
  Filled 2023-11-02: qty 300

## 2023-11-02 MED ORDER — MIDAZOLAM HCL 2 MG/2ML IJ SOLN
INTRAMUSCULAR | Status: AC
  Filled 2023-11-02: qty 2

## 2023-11-02 MED ORDER — 0.9 % SODIUM CHLORIDE (POUR BTL) OPTIME
TOPICAL | Status: DC | PRN
  Administered 2023-11-02: 700 mL

## 2023-11-02 MED ORDER — SODIUM CHLORIDE 0.9 % IV SOLN
2.0000 g | Freq: Two times a day (BID) | INTRAVENOUS | Status: DC
  Administered 2023-11-02 – 2023-11-03 (×2): 2 g via INTRAVENOUS
  Filled 2023-11-02 (×2): qty 12.5

## 2023-11-02 MED ORDER — LIDOCAINE HCL (CARDIAC) PF 100 MG/5ML IV SOSY
PREFILLED_SYRINGE | INTRAVENOUS | Status: DC | PRN
  Administered 2023-11-02: 50 mg via INTRAVENOUS

## 2023-11-02 MED ORDER — MIDAZOLAM HCL 2 MG/2ML IJ SOLN
INTRAMUSCULAR | Status: DC | PRN
  Administered 2023-11-02: 1 mg via INTRAVENOUS

## 2023-11-02 MED ORDER — TRAZODONE HCL 100 MG PO TABS: 100.0000 mg | ORAL_TABLET | Freq: Every day | ORAL | Status: DC

## 2023-11-02 MED ORDER — DAPTOMYCIN-SODIUM CHLORIDE 700-0.9 MG/100ML-% IV SOLN
8.0000 mg/kg | Freq: Every day | INTRAVENOUS | Status: DC
  Administered 2023-11-02 – 2023-11-08 (×7): 700 mg via INTRAVENOUS
  Filled 2023-11-02 (×7): qty 100

## 2023-11-02 MED ORDER — PHENYLEPHRINE 80 MCG/ML (10ML) SYRINGE FOR IV PUSH (FOR BLOOD PRESSURE SUPPORT)
PREFILLED_SYRINGE | INTRAVENOUS | Status: DC | PRN
Start: 2023-11-02 — End: 2023-11-02
  Administered 2023-11-02 (×5): 160 ug via INTRAVENOUS

## 2023-11-02 MED ORDER — OXYCODONE HCL 5 MG/5ML PO SOLN: 5.0000 mg | Freq: Once | ORAL | Status: DC | PRN

## 2023-11-02 SURGICAL SUPPLY — 42 items
BAG COUNTER SPONGE SURGICOUNT (BAG) IMPLANT
BLADE OSC/SAGITTAL 5.5X25 (BLADE) ×1 IMPLANT
BNDG COHESIVE 4X5 TAN STRL LF (GAUZE/BANDAGES/DRESSINGS) ×1 IMPLANT
BNDG ELASTIC 4X5.8 VLCR NS LF (GAUZE/BANDAGES/DRESSINGS) ×1 IMPLANT
BNDG ESMARCH 4X12 STRL LF (GAUZE/BANDAGES/DRESSINGS) ×1 IMPLANT
BNDG GAUZE DERMACEA FLUFF 4 (GAUZE/BANDAGES/DRESSINGS) ×1 IMPLANT
BNDG STRETCH 4X75 STRL LF (GAUZE/BANDAGES/DRESSINGS) ×1 IMPLANT
BOOT STEPPER DURA SM (SOFTGOODS) IMPLANT
CNTNR URN SCR LID CUP LEK RST (MISCELLANEOUS) ×1 IMPLANT
CUFF TOURN SGL QUICK 12 (TOURNIQUET CUFF) IMPLANT
CUFF TOURN SGL QUICK 18X4 (TOURNIQUET CUFF) IMPLANT
DRAIN PENROSE 12X.25 LTX STRL (MISCELLANEOUS) IMPLANT
DURAPREP 26ML APPLICATOR (WOUND CARE) ×1 IMPLANT
ELECTRODE REM PT RTRN 9FT ADLT (ELECTROSURGICAL) ×1 IMPLANT
GAUZE SPONGE 4X4 12PLY STRL (GAUZE/BANDAGES/DRESSINGS) ×2 IMPLANT
GAUZE XEROFORM 1X8 LF (GAUZE/BANDAGES/DRESSINGS) ×1 IMPLANT
GLOVE BIO SURGEON STRL SZ7.5 (GLOVE) ×1 IMPLANT
GLOVE INDICATOR 8.0 STRL GRN (GLOVE) ×1 IMPLANT
GOWN STRL REUS W/ TWL XL LVL3 (GOWN DISPOSABLE) ×1 IMPLANT
GOWN STRL REUS W/TWL XL LVL4 (GOWN DISPOSABLE) ×1 IMPLANT
HANDLE YANKAUER SUCT BULB TIP (MISCELLANEOUS) IMPLANT
HANDPIECE VERSAJET 45 DEG 14 (MISCELLANEOUS) IMPLANT
KIT TURNOVER KIT A (KITS) ×1 IMPLANT
LABEL OR SOLS (LABEL) IMPLANT
MANIFOLD NEPTUNE II (INSTRUMENTS) ×1 IMPLANT
NDL HYPO 25X1 1.5 SAFETY (NEEDLE) ×1 IMPLANT
NDL SAFETY ECLIPSE 18X1.5 (NEEDLE) ×1 IMPLANT
NEEDLE HYPO 25X1 1.5 SAFETY (NEEDLE) ×1 IMPLANT
NS IRRIG 500ML POUR BTL (IV SOLUTION) ×1 IMPLANT
PACK EXTREMITY ARMC (MISCELLANEOUS) ×1 IMPLANT
PAD ABD DERMACEA PRESS 5X9 (GAUZE/BANDAGES/DRESSINGS) ×2 IMPLANT
PENCIL SMOKE EVACUATOR (MISCELLANEOUS) ×1 IMPLANT
POWDER SURGICEL 3.0 GRAM (HEMOSTASIS) IMPLANT
SOLUTION PREP PVP 2OZ (MISCELLANEOUS) ×1 IMPLANT
SPONGE T-LAP 18X18 ~~LOC~~+RFID (SPONGE) ×1 IMPLANT
STAPLER SKIN PROX 35W (STAPLE) IMPLANT
STOCKINETTE M/LG 89821 (MISCELLANEOUS) ×1 IMPLANT
SUT VIC AB 2-0 SH 27XBRD (SUTURE) IMPLANT
SUTURE EHLN 3-0 FS-10 30 BLK (SUTURE) ×1 IMPLANT
SYR 10ML LL (SYRINGE) ×2 IMPLANT
TRAP FLUID SMOKE EVACUATOR (MISCELLANEOUS) ×1 IMPLANT
WATER STERILE IRR 500ML POUR (IV SOLUTION) ×1 IMPLANT

## 2023-11-02 NOTE — Progress Notes (Signed)
 Progress Note   [name] FMW:[id number] DOB: 1950/12/07 DOA: 11/01/2023     1 DOS: the patient was seen and examined on 11/02/2023   Brief hospital course: From HPI Andrew Harding is a 73 y.o. 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 Ashley.  Patient reports compliance with his daily dressing changes.  He denies any improvement in his drainage or appearance of the foot since being on outpatient antibiotics.  Pain is adequately controlled. Denies any other acute issues.      Assessment and Plan:   Ulcer of right foot with bone involvement  s/p right first to amputation with poor wound healing  - Management largely per podiatry, plan for OR tomorrow - Continue to hold eliquis  and plavix  Advance diet after surgery - optimize glucose control  -- cefepime /vancomycin  for now    CAD s/p CABG PAF on eliquis   - hold eliquis  and plavix . Last dose this am.   - anticoagulate with lovenox  tonight  Continue metoprolol  and statin   IDT2DM Continue monitoring glucose Continue insulin  therapy    Advance Care Planning:   Code Status: Full Code discussed with patient at time of admission   Consults: Podiatry   Subjective:  Patient seen and examined at bedside this morning Patient planned for surgical intervention Denied worsening foot pain nausea vomiting abdominal pain  Physical Exam:    General: He is not in acute distress.    Appearance: He is not ill-appearing or toxic-appearing.  HENT:     Head: Normocephalic and atraumatic.     Mouth/Throat:     Mouth: Mucous membranes are moist.  Eyes:     Extraocular Movements: Extraocular movements intact.     Pupils: Pupils are equal, round,  and reactive to light.  Cardiovascular:     Rate and Rhythm: Normal rate and regular rhythm.  Pulmonary:     Effort: Pulmonary effort is normal. No respiratory distress.     Breath sounds: Normal breath sounds. No wheezing.  Abdominal:     General: Abdomen is flat. Bowel sounds are normal. There is no distension.     Palpations: Abdomen is soft.     Tenderness: There is no abdominal tenderness.  Musculoskeletal:     Cervical back: Normal range of motion and neck supple.     Right lower leg: No edema.     Comments: L BKA, prosthetic in place Right foot wrapped in immobilizer, direct examination deferred  Skin:    Capillary Refill: Capillary refill takes less than 2 seconds.  Neurological:     General: No focal deficit present.     Mental Status: He is alert.  Psychiatric:        Mood and Affect: Mood normal.        Behavior: Behavior normal.   Vitals:   11/02/23 1316 11/02/23 1330 11/02/23 1341 11/02/23 1649  BP: 116/71 98/70 110/60 103/65  Pulse: 85 84 87 87  Resp: 20 18 16 17   Temp:  (!) 96.9 F (36.1 C) 97.6 F (36.4 C) 97.6 F (36.4 C)  TempSrc:   Oral Oral  SpO2: 99% 100% 100% 100%  Weight:      Height:        Data Reviewed:  Latest Ref Rng & Units 11/02/2023    5:21 AM 11/01/2023   12:32 PM 05/27/2023    5:10 AM  CBC  WBC 4.0 - 10.5 K/uL 5.7  7.6  5.8   Hemoglobin 13.0 - 17.0 g/dL 89.6  87.4  89.1   Hematocrit 39.0 - 52.0 % 31.8  37.9  32.7   Platelets 150 - 400 K/uL 218  285  284        Latest Ref Rng & Units 11/02/2023    5:21 AM 11/01/2023   12:32 PM 09/11/2023   10:31 AM  BMP  Glucose 70 - 99 mg/dL 88  886    BUN 8 - 23 mg/dL 18  23  11    Creatinine 0.61 - 1.24 mg/dL 8.49  8.23  8.64   Sodium 135 - 145 mmol/L 138  138    Potassium 3.5 - 5.1 mmol/L 4.0  5.1    Chloride 98 - 111 mmol/L 105  106    CO2 22 - 32 mmol/L 23  21    Calcium 8.9 - 10.3 mg/dL 9.0  9.9        Time spent: 55 minutes  Author: Drue Andrew Potter, MD 11/02/2023 5:56 PM  For on  call review [email or web address].

## 2023-11-02 NOTE — Op Note (Signed)
 Operative note   Surgeon:Janee Ureste Press photographer: None    Preop diagnosis: Gangrene with nonhealing ulcer right foot    Postop diagnosis: Same    Procedure: Transmetatarsal amputation right foot    EBL: 30 mL    Anesthesia: General With local.    Hemostasis: None    Specimen: Deep wound culture, bone culture, and bone for pathology    Complications: None    Operative indications:Andrew Harding is an 73 y.o. that presents today for surgical intervention.  The risks/benefits/alternatives/complications have been discussed and consent has been given.    Procedure:  Patient was brought into the OR and placed on the operating table in thesupine position. After anesthesia was obtained theright lower extremity was prepped and draped in usual sterile fashion.  Attention was initially directed to the medial aspect of the right foot where the large necrotic wound was noted.  At this time skin flaps were mapped out for the transmetatarsal amputation.  Full-thickness flaps were created dorsal and plantar.  Osteotomies were created diffusely through all 5 metatarsals and the residual forefoot was removed from the surgical field in toto.  At this time a deep bone culture was performed to the first metatarsal.  A swab was also performed to the first metatarsal.  Finally the residual forefoot was sent for pathological examination with the proximal margin containing ink.  The wound was flushed with copious amounts of irrigation.  Good perfusion of the skin flaps were noted at this time.  Multiple bleeders were Bovie cauterized as appropriate.  This was further supplemented with Surgicel powder.  The skin was reapproximated with a 2-0 Vicryl for subcutaneous tissue and 3-0 nylon with skin staples for the skin.  A large bulky compressive sterile dressing was applied to the right foot.  An equalizer walker boot was applied to the right lower extremity postprocedure.    Patient tolerated the procedure and  anesthesia well.  Was transported from the OR to the PACU with all vital signs stable and vascular status intact. To be discharged per routine protocol.  Will follow up in approximately 1 week in the outpatient clinic.

## 2023-11-02 NOTE — Anesthesia Procedure Notes (Signed)
 Procedure Name: Intubation Date/Time: 11/02/2023 11:44 AM  Performed by: Delores Evalene BROCKS, CRNAPre-anesthesia Checklist: Patient identified, Patient being monitored, Timeout performed, Emergency Drugs available and Suction available Patient Re-evaluated:Patient Re-evaluated prior to induction Oxygen Delivery Method: Circle system utilized Preoxygenation: Pre-oxygenation with 100% oxygen Induction Type: IV induction and Rapid sequence Laryngoscope Size: McGrath and 4 Grade View: Grade I Tube type: Oral Tube size: 7.5 mm Number of attempts: 1 Airway Equipment and Method: Stylet and Video-laryngoscopy Placement Confirmation: ETT inserted through vocal cords under direct vision, positive ETCO2 and breath sounds checked- equal and bilateral Secured at: 23 cm Tube secured with: Tape Dental Injury: Teeth and Oropharynx as per pre-operative assessment

## 2023-11-02 NOTE — Progress Notes (Signed)
 Pharmacy Antibiotic Note  Andrew Harding is a 73 y.o. male admitted on 11/01/2023 with osteomyelitis.  Pharmacy has been consulted for Vancomycin  dosing.  Plan: Vancomycin  2000 mg IV loading dose followed by Vancomycin  1500 mg IV Q 24 hrs. Goal AUC 400-550. Expected AUC: 435.0 SCr used: 1.5 Expected Cmin: 13.0    Height: 6' 3 (190.5 cm) Weight: 82.4 kg (181 lb 10.5 oz) IBW/kg (Calculated) : 84.5  Temp (24hrs), Avg:97.9 F (36.6 C), Min:96.8 F (36 C), Max:98.3 F (36.8 C)  Recent Labs  Lab 11/01/23 1232 11/02/23 0521  WBC 7.6 5.7  CREATININE 1.76* 1.50*  LATICACIDVEN 1.7  --     Estimated Creatinine Clearance: 50.4 mL/min (A) (by C-G formula based on SCr of 1.5 mg/dL (H)).    No Known Allergies  Antimicrobials this admission: Vancomycin  7/3 >>  Cefepime  7/3 >>   Dose adjustments this admission:  Microbiology results: 7/4 wound cx: collected  Thank you for allowing pharmacy to be a part of this patient's care.  Canesha Tesfaye A Dayna Alia, PharmD Clinical Pharmacist 11/02/2023 1:30 PM

## 2023-11-02 NOTE — Progress Notes (Signed)
 ID PROGRESS NOTE  73yo M with CAD s/p CAB, afib, left BKA, CKD, T2DM, with gangrenous non healing ulcer to right foot, previous first ray amputation, hx of MRSA. He was empirically started on vancomycin  and cefepime . Dr Ashley from podiatry evaluated patient and recommended  right foot TMA on 7/4. He has sent cultures and path, which are pending. He suspects still ongoing osteomyelitis. ID asked to weigh in.  Labs:  Lab Results  Component Value Date   WBC 5.7 11/02/2023   HGB 10.3 (L) 11/02/2023   HCT 31.8 (L) 11/02/2023   MCV 89.8 11/02/2023   PLT 218 11/02/2023   Lab Results  Component Value Date   CREATININE 1.50 (H) 11/02/2023   Lab Results  Component Value Date   ESRSEDRATE 41 (H) 04/05/2023   Lab Results  Component Value Date   CRP 2.0 (H) 04/05/2023    Old micro Methicillin resistant staphylococcus aureus      MIC    CIPROFLOXACIN  >=8 RESISTANT Resistant    CLINDAMYCIN >=8 RESISTANT Resistant    ERYTHROMYCIN >=8 RESISTANT Resistant    GENTAMICIN  <=0.5 SENSI... Sensitive    Inducible Clindamycin NEGATIVE Sensitive    LINEZOLID 2 SENSITIVE Sensitive    OXACILLIN >=4 RESISTANT Resistant    RIFAMPIN <=0.5 SENSI... Sensitive    TETRACYCLINE >=16 RESIST... Resistant    TRIMETH /SULFA  <=10 SENSIT... Sensitive    VANCOMYCIN  2 SENSITIVE Sensitive     A/P: right diabetic foot osteomyelitis s/p TMA - will continue on current regimen of MRSA coverage, change vancomycin  to daptomycin  - will check baseline ck - will continue with cefepime  - await culture results to make final recommendations.  Montie FURY Luiz MD MPH [facility] [phone]

## 2023-11-02 NOTE — Significant Event (Addendum)
       CROSS COVER NOTE  NAME: Andrew Harding MRN: [id number] DOB : February 03, 1951 ATTENDING PHYSICIAN: Dorinda Drue DASEN, MD    Date of Service   11/02/2023   HPI/Events of Note   Message received from RN hello, this patient want something to make him sleep tonight. he has a schedule trazodone  tonight but he said that it didn't work last night essage received from RN   Interventions   Assessment/Plan: Discontinue trazodone  Seroquel  25 mg q hs x 2 doses - reassess on rounds for effectiveness        Erminio LITTIE Cone NP [facility] Cross Cover 7pm-7am - check amion for availability Pager [phone]

## 2023-11-02 NOTE — Anesthesia Preprocedure Evaluation (Signed)
 Anesthesia Evaluation  Patient identified by MRN, date of birth, ID band Patient awake    Reviewed: Allergy & Precautions, NPO status , Patient's Chart, lab work & pertinent test results  History of Anesthesia Complications Negative for: history of anesthetic complications  Airway Mallampati: III  TM Distance: <3 FB Neck ROM: full    Dental  (+) Chipped   Pulmonary neg shortness of breath, former smoker   Pulmonary exam normal        Cardiovascular Exercise Tolerance: Good hypertension, + CAD and + CABG  Normal cardiovascular exam     Neuro/Psych  Neuromuscular disease  negative psych ROS   GI/Hepatic negative GI ROS, Neg liver ROS,,,  Endo/Other  diabetes, Type 2    Renal/GU Renal disease     Musculoskeletal   Abdominal   Peds  Hematology negative hematology ROS (+)   Anesthesia Other Findings Past Medical History: 08/24/2022: Acute osteomyelitis of left ankle or foot (HCC) 09/05/2022: AKI (acute kidney injury) (HCC) 09/03/2023: 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) 08/19/2022: Atrial fibrillation with RVR (HCC) No date: Bladder neck obstruction 08/17/2022: Cellulitis No date: Chronic kidney disease No date: Coronary artery disease     Comment:  a.) s/p 4v CABG in 2014 No date: Diabetes mellitus without complication (HCC) No date: Diabetic neuropathy (HCC) No date: Diabetic peripheral neuropathy (HCC) 12/21/2017: Diabetic ulcer of toe of left foot associated with  diabetes mellitus of other type, limited to breakdown of skin (HCC) No date: Diverticulosis No date: Gout 05/11/2023: Gram-negative bacteremia No date: Heart murmur 05/31/2015: History of osteomyelitis No date: Hypercholesteremia No date: Hyperlipidemia No date: Hypertension 08/17/2022: Hypotension due to hypovolemia 08/19/2022: Infection of left foot 02/28/2023: MSSA bacteremia 05/10/2023: Open  wound of left foot with complication 05/31/2015: Osteomyelitis (HCC) No date: Peripheral neuropathy 02/26/2023: Postural dizziness with presyncope 06/09/2023: S/P BKA (below knee amputation) unilateral, left (HCC) 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 08/29/2022: S/P transmetatarsal amputation of foot, left (HCC) 08/17/2022: Sepsis (HCC) 05/10/2023: Sepsis (HCC) 11/01/2015: Status post amputation of toe of right foot (HCC) No date: Tubular adenoma No date: Vitamin D  deficiency  Past Surgical History: 09/14/2023: ACHILLES TENDON SURGERY; Right     Comment:  Procedure: TENOTOMY, ACHILLES;  Surgeon: [name],              DPM;  Location: [facility];  Service: Orthopedics/Podiatry;              Laterality: Right; 08/19/2022: AMPUTATION; Left     Comment:  Procedure: TRANSMETATARSAL AMPUTATION LEFT FOOT WITH               IRRIGATION AND DEBRIDEMENT;  Surgeon: Lennie Barter, DPM;              Location: [facility];  Service: Podiatry;  Laterality:               Left; 05/16/2023: AMPUTATION; Left     Comment:  Procedure: AMPUTATION BELOW KNEE;  Surgeon: Marea Selinda RAMAN, MD;  Location: [facility];  Service: General;                Laterality: Left; 09/14/2023: AMPUTATION; Right     Comment:  Procedure: AMPUTATION, FOOT, RAY;  Surgeon: [name], DPM;  Location: [facility];  Service:  Orthopedics/Podiatry;  Laterality: Right; 06/01/2015: AMPUTATION TOE; Right     Comment:  Procedure: AMPUTATION TOE;  Surgeon: Donnice Cory,               DPM;  Location: [facility];  Service: Podiatry;                Laterality: Right; 08/11/2022: AMPUTATION TOE; Left     Comment:  Procedure: AMPUTATION TOE 2, 3, 4;  Surgeon: [name], DPM;  Location: [facility];  Service: Podiatry;                Laterality: Left; No date: CATARACT EXTRACTION, BILATERAL 06/12/2022: CIRCUMCISION; N/A     Comment:  Procedure: CIRCUMCISION ADULT;  Surgeon: Penne Knee, MD;  Location: [facility];  Service: Urology;                Laterality: N/A; 02/14/2016: COLONOSCOPY WITH PROPOFOL ; N/A     Comment:  Procedure: COLONOSCOPY WITH PROPOFOL ;  Surgeon: Gladis RAYMOND Mariner, MD;  Location: [facility];  Service:               Endoscopy;  Laterality: N/A; 01/07/2019: COLONOSCOPY WITH PROPOFOL ; N/A     Comment:  Procedure: COLONOSCOPY WITH PROPOFOL ;  Surgeon:               Mariner Gladis RAYMOND, MD;  Location: [facility];                Service: Endoscopy;  Laterality: N/A; 08/2012: CORONARY ARTERY BYPASS GRAFT; N/A 06/01/2015: EXCISION PARTIAL PHALANX; Right     Comment:  Procedure: EXCISION PARTIAL PHALANX /  BONE;  Surgeon:               Donnice Cory, DPM;  Location: [facility];  Service:               Podiatry;  Laterality: Right; 05/29/2016: FLEXIBLE SIGMOIDOSCOPY; N/A     Comment:  Procedure: FLEXIBLE SIGMOIDOSCOPY;  Surgeon: Gladis RAYMOND Mariner, MD;  Location: [facility];  Service:               Endoscopy;  Laterality: N/A; 08/23/2022: INCISION AND DRAINAGE; Left     Comment:  Procedure: INCISION AND DRAINAGE;  Surgeon: [name], DPM;  Location: [facility];  Service: Podiatry;                Laterality: Left; 09/15/2022: INCISION AND DRAINAGE OF WOUND; Left     Comment:  Procedure: 11044 - DEBRIDE BONE and EXCISION IF 1ST               METATARSAL BONE WITH  DELAY PRIMARY CLOSURE;  Surgeon:               [name], DPM;  Location: [facility];  Service:               Podiatry;  Laterality: Left; 02/28/2023: IRRIGATION AND DEBRIDEMENT FOOT; Left     Comment:  Procedure: IRRIGATION AND DEBRIDEMENT FOOT;  Surgeon:               [name],  DPM;  Location: [facility];  Service:               Orthopedics/Podiatry;  Laterality: Left; No date: KNEE ARTHROSCOPY; Left 08/22/2022: LOWER EXTREMITY ANGIOGRAPHY; Left     Comment:  Procedure: Lower Extremity Angiography;  Surgeon:               Jama Cordella MATSU, MD;  Location: [facility];               Service: Cardiovascular;  Laterality: Left; 05/11/2023: LOWER EXTREMITY ANGIOGRAPHY; Left     Comment:  Procedure: Lower Extremity Angiography;  Surgeon: Marea Selinda RAMAN, MD;  Location: [facility];  Service:               Cardiovascular;  Laterality: Left; 09/11/2023: LOWER EXTREMITY ANGIOGRAPHY; Right     Comment:  Procedure: Lower Extremity Angiography;  Surgeon:               Jama Cordella MATSU, MD;  Location: [facility];               Service: Cardiovascular;  Laterality: Right; 09/11/2023: LOWER EXTREMITY INTERVENTION; Right     Comment:  Procedure: LOWER EXTREMITY INTERVENTION;  Surgeon:               Jama Cordella MATSU, MD;  Location: [facility];               Service: Cardiovascular;  Laterality: Right; 03/01/2023: TEE WITHOUT CARDIOVERSION; N/A     Comment:  Procedure: TRANSESOPHAGEAL ECHOCARDIOGRAM;  Surgeon:               Wilburn Keller BROCKS, MD;  Location: [facility];  Service:               Cardiovascular;  Laterality: N/A; 09/15/2022: WOUND DEBRIDEMENT; Left     Comment:  Procedure: 11043 - DEBRIDE SKIN. MUSCLE FASCIA;                Surgeon: [name], DPM;  Location: [facility];                Service: Podiatry;  Laterality: Left;  BMI    Body Mass Index: 23.50 kg/m      Reproductive/Obstetrics negative OB ROS                              Anesthesia Physical Anesthesia Plan  ASA: 3  Anesthesia Plan: General ETT   Post-op Pain Management:    Induction: Intravenous  PONV Risk Score and Plan: Ondansetron , Dexamethasone , Midazolam  and Treatment may vary due to age or medical condition  Airway Management Planned: Oral ETT  Additional Equipment:   Intra-op Plan:   Post-operative Plan: Extubation in OR  Informed Consent: I have reviewed the patients History and Physical, chart, labs and discussed the procedure including the risks,  benefits and alternatives for the proposed anesthesia with the patient or authorized representative who has indicated his/her understanding and acceptance.     Dental Advisory Given  Plan Discussed with: Anesthesiologist, CRNA and Surgeon  Anesthesia Plan Comments: (Patient consented for risks of anesthesia including but not limited to:  - adverse reactions to medications - damage to eyes, teeth, lips or other oral mucosa - nerve damage due to positioning  - sore throat or hoarseness - Damage  to heart, brain, nerves, lungs, other parts of body or loss of life  Patient voiced understanding and assent.)        Anesthesia Quick Evaluation

## 2023-11-02 NOTE — Transfer of Care (Signed)
 Immediate Anesthesia Transfer of Care Note  [name] Feeling  Procedure(s) Performed: AMPUTATION, FOOT, TRANSMETATARSAL (Right: Toe)  Patient Location: PACU  Anesthesia Type:General  Level of Consciousness: drowsy  Airway & Oxygen Therapy: Patient Spontanous Breathing and Patient connected to face mask oxygen  Post-op Assessment: Report given to RN and Post -op Vital signs reviewed and stable  Post vital signs: Reviewed and stable  Last Vitals:  Vitals Value Taken Time  BP 118/72 11/02/23 12:51  Temp    Pulse 99 11/02/23 12:53  Resp 15 11/02/23 12:53  SpO2 100 % 11/02/23 12:53  Vitals shown include unfiled device data.  Last Pain:  Vitals:   11/02/23 0800  TempSrc:   PainSc: 0-No pain      Patients Stated Pain Goal: 0 (11/01/23 2200)  Complications: No notable events documented.

## 2023-11-02 NOTE — TOC Initial Note (Signed)
 Transition of Care [facility]) - Initial/Assessment Note    Patient Details  Name: KIEFER OPHEIM MRN: [id number] Date of Birth: [birth year]  Transition of [facility] CM/SW Contact:    Quintella Suzen Jansky, RN Phone Number: 11/02/2023, 9:10 AM  Clinical Narrative:                 Patient lives with spouse. She assists with dressing changes. He has PCP in community for follow-up. TOC will continue to monitor for discharge needs.        Patient Goals and CMS Choice            Expected Discharge Plan and Services                                              Prior Living Arrangements/Services                       Activities of Daily Living   ADL Screening (condition at time of admission) Independently performs ADLs?: Yes (appropriate for developmental age) Is the patient deaf or have difficulty hearing?: No Does the patient have difficulty seeing, even when wearing glasses/contacts?: No Does the patient have difficulty concentrating, remembering, or making decisions?: No  Permission Sought/Granted                  Emotional Assessment              Admission diagnosis:  Osteomyelitis of right foot (HCC) [M86.9] Osteomyelitis of right foot, unspecified type [facility] [M86.9] Patient Active Problem List   Diagnosis Date Noted   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) 11/01/2023   Osteomyelitis of right foot (HCC) 11/01/2023   Atherosclerosis of native arteries of the extremities with ulceration (HCC) 09/03/2023   Atherosclerosis of native arteries of extremity with intermittent claudication (HCC) 09/02/2023   S/P BKA (below knee amputation) unilateral, 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   Gram-negative bacteremia 05/11/2023   Sepsis (HCC) 05/10/2023   Open wound of left foot with complication 05/10/2023   MSSA bacteremia 02/28/2023   Controlled type 2 diabetes mellitus with neuropathy (HCC) 02/27/2023   Postural dizziness with presyncope 02/26/2023    Diabetic peripheral neuropathy (HCC)    Wound drainage 09/02/2022   Diabetic foot infection with possible osteomyelitis, left  (HCC) 08/29/2022   S/P transmetatarsal amputation of foot, left (HCC) 08/29/2022   Acute osteomyelitis of left ankle or foot (HCC) 08/24/2022   Medication management 08/24/2022   Diabetic infection of left foot (HCC) 08/24/2022   Infection of left foot 08/19/2022   Atrial fibrillation (HCC) 08/19/2022   Cellulitis 08/17/2022   Hypotension due to hypovolemia 08/17/2022   Gout 04/05/2018   Vitamin D  deficiency, unspecified 04/05/2018   Diabetic ulcer of toe of left foot associated with type 2 diabetes mellitus, limited to breakdown of skin (HCC) 12/21/2017   Status post amputation of toe of right foot (HCC) 11/01/2015   CKD (chronic kidney disease) stage 3, GFR 30-59 ml/min (HCC) 10/08/2015   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   Osteomyelitis (HCC) 05/31/2015   Type 2 diabetes mellitus with stage 3b chronic kidney disease, with long-term current use of insulin  (HCC) 05/31/2015   History of osteomyelitis 05/31/2015   Benign essential hypertension 09/14/2014   Coronary artery disease 09/17/2012   Mixed hyperlipidemia 09/17/2012   CAD 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  PCP:  Fernande Ophelia [name], MD Pharmacy:   [facility] [phone] [place], [place] - [address] AT [facility] OF [address] [place] [address] Phone: [phone] Fax: [phone]  [facility] [address] [place] [address] Phone: [phone] Fax: [phone]     Social Drivers of Health (SDOH) Social History: SDOH Screenings   Food Insecurity: No Food Insecurity (11/01/2023)  Housing: Low Risk  (11/01/2023)  Transportation Needs: No Transportation Needs (11/01/2023)  Utilities: Not At Risk (11/01/2023)  Depression (PHQ2-9): Low Risk  (05/03/2023)  Financial Resource Strain: Low Risk  (12/13/2022)   Received  from [facility]  Social Connections: Moderately Isolated (11/01/2023)  Tobacco Use: Medium Risk (11/01/2023)   SDOH Interventions:     Readmission Risk Interventions    02/28/2023   12:53 PM  Readmission Risk Prevention Plan  Transportation Screening Complete  PCP or Specialist Appt within 3-5 Days Complete  HRI or Home Care Consult Complete  Social Work Consult for Recovery Care Planning/Counseling Complete  Palliative Care Screening Not Applicable  Medication Review Oceanographer) Complete

## 2023-11-02 NOTE — Anesthesia Postprocedure Evaluation (Signed)
 Anesthesia Post Note  [name]  Procedure(s) Performed: AMPUTATION, FOOT, TRANSMETATARSAL (Right: Toe)  Patient location during evaluation: PACU Anesthesia Type: General Level of consciousness: awake and alert Pain management: pain level controlled Vital Signs Assessment: post-procedure vital signs reviewed and stable Respiratory status: spontaneous breathing, nonlabored ventilation and respiratory function stable Cardiovascular status: blood pressure returned to baseline and stable Postop Assessment: no apparent nausea or vomiting Anesthetic complications: no   No notable events documented.   Last Vitals:  Vitals:   11/02/23 1330 11/02/23 1341  BP: 98/70 110/60  Pulse: 84 87  Resp: 18 16  Temp: (!) 36.1 C 36.4 C  SpO2: 100% 100%    Last Pain:  Vitals:   11/02/23 1341  TempSrc: Oral  PainSc:                  Fairy POUR Devyn Griffing

## 2023-11-02 NOTE — Plan of Care (Signed)
 Problem: Education: Goal: Ability to describe self-care measures that may prevent or decrease complications (Diabetes Survival Skills Education) will improve 11/02/2023 0743 by Jenel Candis HERO, RN Outcome: Progressing 11/02/2023 0742 by Jenel Candis HERO, RN Outcome: Progressing Goal: Individualized Educational Video(s) 11/02/2023 0743 by Jenel Candis HERO, RN Outcome: Progressing 11/02/2023 0742 by Jenel Candis HERO, RN Outcome: Progressing   Problem: Coping: Goal: Ability to adjust to condition or change in health will improve 11/02/2023 0743 by Jenel Candis HERO, RN Outcome: Progressing 11/02/2023 0742 by Jenel Candis HERO, RN Outcome: Progressing   Problem: Fluid Volume: Goal: Ability to maintain a balanced intake and output will improve 11/02/2023 0743 by Jenel Candis HERO, RN Outcome: Progressing 11/02/2023 0742 by Jenel Candis HERO, RN Outcome: Progressing   Problem: Health Behavior/Discharge Planning: Goal: Ability to identify and utilize available resources and services will improve 11/02/2023 0743 by Jenel Candis HERO, RN Outcome: Progressing 11/02/2023 0742 by Jenel Candis HERO, RN Outcome: Progressing Goal: Ability to manage health-related needs will improve 11/02/2023 0743 by Jenel Candis HERO, RN Outcome: Progressing 11/02/2023 0742 by Jenel Candis HERO, RN Outcome: Progressing   Problem: Metabolic: Goal: Ability to maintain appropriate glucose levels will improve 11/02/2023 0743 by Jenel Candis HERO, RN Outcome: Progressing 11/02/2023 0742 by Jenel Candis HERO, RN Outcome: Progressing   Problem: Nutritional: Goal: Maintenance of adequate nutrition will improve 11/02/2023 0743 by Jenel Candis HERO, RN Outcome: Progressing 11/02/2023 0742 by Jenel Candis HERO, RN Outcome: Progressing Goal: Progress toward achieving an optimal weight will improve 11/02/2023 0743 by Jenel Candis HERO, RN Outcome: Progressing 11/02/2023 0742 by Jenel Candis HERO, RN Outcome: Progressing   Problem: Skin Integrity: Goal: Risk for impaired skin  integrity will decrease 11/02/2023 0743 by Jenel Candis HERO, RN Outcome: Progressing 11/02/2023 0742 by Jenel Candis HERO, RN Outcome: Progressing   Problem: Tissue Perfusion: Goal: Adequacy of tissue perfusion will improve 11/02/2023 0743 by Jenel Candis HERO, RN Outcome: Progressing 11/02/2023 0742 by Jenel Candis HERO, RN Outcome: Progressing   Problem: Education: Goal: Knowledge of General Education information will improve Description: Including pain rating scale, medication(s)/side effects and non-pharmacologic comfort measures 11/02/2023 0743 by Jenel Candis HERO, RN Outcome: Progressing 11/02/2023 0742 by Jenel Candis HERO, RN Outcome: Progressing   Problem: Health Behavior/Discharge Planning: Goal: Ability to manage health-related needs will improve 11/02/2023 0743 by Jenel Candis HERO, RN Outcome: Progressing 11/02/2023 0742 by Jenel Candis HERO, RN Outcome: Progressing   Problem: Clinical Measurements: Goal: Ability to maintain clinical measurements within normal limits will improve 11/02/2023 0743 by Jenel Candis HERO, RN Outcome: Progressing 11/02/2023 0742 by Jenel Candis HERO, RN Outcome: Progressing Goal: Will remain free from infection 11/02/2023 0743 by Jenel Candis HERO, RN Outcome: Progressing 11/02/2023 0742 by Jenel Candis HERO, RN Outcome: Progressing Goal: Diagnostic test results will improve 11/02/2023 0743 by Jenel Candis HERO, RN Outcome: Progressing 11/02/2023 0742 by Jenel Candis HERO, RN Outcome: Progressing Goal: Respiratory complications will improve 11/02/2023 0743 by Jenel Candis HERO, RN Outcome: Progressing 11/02/2023 0742 by Jenel Candis HERO, RN Outcome: Progressing Goal: Cardiovascular complication will be avoided 11/02/2023 0743 by Jenel Candis HERO, RN Outcome: Progressing 11/02/2023 0742 by Jenel Candis HERO, RN Outcome: Progressing   Problem: Activity: Goal: Risk for activity intolerance will decrease 11/02/2023 0743 by Jenel Candis HERO, RN Outcome: Progressing 11/02/2023 0742 by Jenel Candis HERO, RN Outcome:  Progressing   Problem: Nutrition: Goal: Adequate nutrition will be maintained 11/02/2023 0743 by Jenel Candis HERO, RN Outcome: Progressing 11/02/2023 0742 by Jenel Candis HERO, RN Outcome: Progressing  Problem: Coping: Goal: Level of anxiety will decrease 11/02/2023 0743 by Jenel Candis HERO, RN Outcome: Progressing 11/02/2023 0742 by Jenel Candis HERO, RN Outcome: Progressing   Problem: Elimination: Goal: Will not experience complications related to bowel motility 11/02/2023 0743 by Jenel Candis HERO, RN Outcome: Progressing 11/02/2023 0742 by Jenel Candis HERO, RN Outcome: Progressing Goal: Will not experience complications related to urinary retention 11/02/2023 0743 by Jenel Candis HERO, RN Outcome: Progressing 11/02/2023 0742 by Jenel Candis HERO, RN Outcome: Progressing   Problem: Pain Managment: Goal: General experience of comfort will improve and/or be controlled 11/02/2023 0743 by Jenel Candis HERO, RN Outcome: Progressing 11/02/2023 0742 by Jenel Candis HERO, RN Outcome: Progressing   Problem: Safety: Goal: Ability to remain free from injury will improve 11/02/2023 0743 by Jenel Candis HERO, RN Outcome: Progressing 11/02/2023 0742 by Jenel Candis HERO, RN Outcome: Progressing   Problem: Skin Integrity: Goal: Risk for impaired skin integrity will decrease 11/02/2023 0743 by Jenel Candis HERO, RN Outcome: Progressing 11/02/2023 0742 by Jenel Candis HERO, RN Outcome: Progressing

## 2023-11-02 NOTE — Plan of Care (Signed)

## 2023-11-03 ENCOUNTER — Encounter: Payer: Self-pay | Admitting: Podiatry

## 2023-11-03 DIAGNOSIS — L97516 Non-pressure chronic ulcer of other part of right foot with bone involvement without evidence of necrosis: Secondary | ICD-10-CM | POA: Diagnosis not present

## 2023-11-03 LAB — CBC WITH DIFFERENTIAL/PLATELET
Abs Immature Granulocytes: 0.03 K/uL (ref 0.00–0.07)
Basophils Absolute: 0 K/uL (ref 0.0–0.1)
Basophils Relative: 0 %
Eosinophils Absolute: 0.1 K/uL (ref 0.0–0.5)
Eosinophils Relative: 1 %
HCT: 30.5 % — ABNORMAL LOW (ref 39.0–52.0)
Hemoglobin: 9.9 g/dL — ABNORMAL LOW (ref 13.0–17.0)
Immature Granulocytes: 1 %
Lymphocytes Relative: 13 %
Lymphs Abs: 0.8 K/uL (ref 0.7–4.0)
MCH: 28.9 pg (ref 26.0–34.0)
MCHC: 32.5 g/dL (ref 30.0–36.0)
MCV: 88.9 fL (ref 80.0–100.0)
Monocytes Absolute: 0.4 K/uL (ref 0.1–1.0)
Monocytes Relative: 7 %
Neutro Abs: 4.7 K/uL (ref 1.7–7.7)
Neutrophils Relative %: 78 %
Platelets: 200 K/uL (ref 150–400)
RBC: 3.43 MIL/uL — ABNORMAL LOW (ref 4.22–5.81)
RDW: 14.7 % (ref 11.5–15.5)
WBC: 6 K/uL (ref 4.0–10.5)
nRBC: 0 % (ref 0.0–0.2)

## 2023-11-03 LAB — BASIC METABOLIC PANEL WITH GFR
Anion gap: 8 (ref 5–15)
BUN: 17 mg/dL (ref 8–23)
CO2: 21 mmol/L — ABNORMAL LOW (ref 22–32)
Calcium: 8.8 mg/dL — ABNORMAL LOW (ref 8.9–10.3)
Chloride: 108 mmol/L (ref 98–111)
Creatinine, Ser: 1.22 mg/dL (ref 0.61–1.24)
GFR, Estimated: >60 mL/min (ref >60)
Glucose, Bld: 177 mg/dL — ABNORMAL HIGH (ref 70–99)
Potassium: 4.4 mmol/L (ref 3.5–5.1)
Sodium: 137 mmol/L (ref 135–145)

## 2023-11-03 LAB — GLUCOSE, CAPILLARY
Glucose-Capillary: 121 mg/dL — ABNORMAL HIGH (ref 70–99)
Glucose-Capillary: 118 mg/dL — ABNORMAL HIGH (ref 70–99)
Glucose-Capillary: 140 mg/dL — ABNORMAL HIGH (ref 70–99)
Glucose-Capillary: 140 mg/dL — ABNORMAL HIGH (ref 70–99)

## 2023-11-03 MED ORDER — SODIUM CHLORIDE 0.9 % IV SOLN
2.0000 g | Freq: Three times a day (TID) | INTRAVENOUS | Status: DC
  Administered 2023-11-03 – 2023-11-06 (×9): 2 g via INTRAVENOUS
  Filled 2023-11-03 (×12): qty 12.5

## 2023-11-03 NOTE — Progress Notes (Signed)
 Progress Note   [name] FMW:[id number] DOB: [birth year] DOA: 11/01/2023     2 DOS: the patient was seen and examined on 11/03/2023     Brief hospital course: From HPI Andrew Harding is a 73 y.o. 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 Ashley.  Patient reports compliance with his daily dressing changes.  He denies any improvement in his drainage or appearance of the foot since being on outpatient antibiotics.  Pain is adequately controlled. Denies any other acute issues.       Assessment and Plan:    Ulcer of right foot with bone involvement  s/p right first to amputation with poor wound healing  Patient underwent surgery by podiatry on 11/02/2023 - Continue to hold eliquis  and plavix  Advance diet after surgery - optimize glucose control  -- cefepime /vancomycin  for now  Continue PT OT   CAD s/p CABG PAF on eliquis   - hold eliquis  and plavix . Last dose this am.   - anticoagulate with lovenox  tonight  Continue metoprolol  and statin   IDT2DM Continue monitoring glucose Continue insulin  therapy    Advance Care Planning:   Code Status: Full Code discussed with patient at time of admission   Consults: Podiatry   Subjective:  Patient seen and examined at bedside this morning Patient underwent surgical intervention on 11/02/2023 He denies worsening foot pain nausea vomiting abdominal pain   Physical Exam:     General: He is not in acute distress.    Appearance: He is not ill-appearing or toxic-appearing.  HENT:     Head: Normocephalic and atraumatic.     Mouth/Throat:     Mouth: Mucous membranes are moist.  Eyes:     Extraocular Movements: Extraocular movements intact.      Pupils: Pupils are equal, round, and reactive to light.  Cardiovascular:     Rate and Rhythm: Normal rate and regular rhythm.  Pulmonary:     Effort: Pulmonary effort is normal. No respiratory distress.     Breath sounds: Normal breath sounds. No wheezing.  Abdominal:     General: Abdomen is flat. Bowel sounds are normal. There is no distension.     Palpations: Abdomen is soft.     Tenderness: There is no abdominal tenderness.  Musculoskeletal:     Cervical back: Normal range of motion and neck supple.     Right lower leg: No edema.     Comments: L BKA, prosthetic in place Right foot wrapped in immobilizer, direct examination deferred  Skin:    Capillary Refill: Capillary refill takes less than 2 seconds.  Neurological:     General: No focal deficit present.     Mental Status: He is alert.  Psychiatric:        Mood and Affect: Mood normal.        Behavior: Behavior normal.      Data Reviewed:     Vitals:   11/03/23 0000 11/03/23 0434 11/03/23 0810 11/03/23 0846  BP: 111/65 100/62 100/67 100/67  Pulse: 88 79 81 81  Resp: 18 16 17    Temp: 97.9 F (36.6 C) 97.9 F (36.6 C) 97.7 F (36.5 C)   TempSrc:   Oral   SpO2: 98% 97% 100%   Weight:  Height:          Latest Ref Rng & Units 11/03/2023    5:23 AM 11/02/2023    5:21 AM 11/01/2023   12:32 PM  CBC  WBC 4.0 - 10.5 K/uL 6.0  5.7  7.6   Hemoglobin 13.0 - 17.0 g/dL 9.9  89.6  87.4   Hematocrit 39.0 - 52.0 % 30.5  31.8  37.9   Platelets 150 - 400 K/uL 200  218  285        Latest Ref Rng & Units 11/03/2023    5:23 AM 11/02/2023    5:21 AM 11/01/2023   12:32 PM  BMP  Glucose 70 - 99 mg/dL 822  88  886   BUN 8 - 23 mg/dL 17  18  23    Creatinine 0.61 - 1.24 mg/dL 8.77  8.49  8.23   Sodium 135 - 145 mmol/L 137  138  138   Potassium 3.5 - 5.1 mmol/L 4.4  4.0  5.1   Chloride 98 - 111 mmol/L 108  105  106   CO2 22 - 32 mmol/L 21  23  21    Calcium 8.9 - 10.3 mg/dL 8.8  9.0  9.9      Author: Drue ONEIDA Potter, MD 11/03/2023  3:09 PM  For on call review [email or web address].

## 2023-11-03 NOTE — Plan of Care (Signed)
  Problem: Coping: Goal: Ability to adjust to condition or change in health will improve Outcome: Progressing   Problem: Metabolic: Goal: Ability to maintain appropriate glucose levels will improve Outcome: Progressing   Problem: Clinical Measurements: Goal: Diagnostic test results will improve Outcome: Progressing   Problem: Pain Managment: Goal: General experience of comfort will improve and/or be controlled Outcome: Progressing

## 2023-11-03 NOTE — Evaluation (Signed)
 Physical Therapy Evaluation Patient Details Name: Andrew Harding MRN: [id number] DOB: [birth year] Today's Date: 11/03/2023  History of Present Illness  Andrew Harding is a 73 y.o. 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 Ashley.  Clinical Impression  Patient received in bed. He is very pleasant and cooperative and agrees to PT assessment. Patient is independent with bed mobility. Dons L LE prosthetic independently. Patient is able to stand from low bed with cga. He is able to step pivot to recliner with cga/supervision. He is currently limited to transfers only with heel weightbearing on Right. Patient understands recommendations for promotion of wound healing. He will continue to benefit from skilled PT while here to improve mobility.         If plan is discharge home, recommend the following: Help with stairs or ramp for entrance   Can travel by private vehicle    yes    Equipment Recommendations None recommended by PT  Recommendations for Other Services       Functional Status Assessment Patient has had a recent decline in their functional status and demonstrates the ability to make significant improvements in function in a reasonable and predictable amount of time.     Precautions / Restrictions Precautions Precautions: Fall Recall of Precautions/Restrictions: Intact Restrictions Weight Bearing Restrictions Per Provider Order: Yes Other Position/Activity Restrictions: heel weight bearing transfers only      Mobility  Bed Mobility Overal bed mobility: Independent                  Transfers Overall transfer level: Needs assistance Equipment used: Rolling walker (2 wheels) Transfers: Sit to/from Stand Sit to  Stand: Contact guard assist           General transfer comment: cues for hand placement, good carryover.    Ambulation/Gait Ambulation/Gait assistance: Contact guard assist Gait Distance (Feet): 3 Feet Assistive device: Rolling walker (2 wheels) Gait Pattern/deviations: Step-to pattern Gait velocity: decr     General Gait Details: step to 3 feet from bed to recliner- patient limited to transfers only  Stairs            Wheelchair Mobility     Tilt Bed    Modified Rankin (Stroke Patients Only)       Balance Overall balance assessment: Needs assistance Sitting-balance support: Feet supported Sitting balance-Leahy Scale: Normal     Standing balance support: Bilateral upper extremity supported, During functional activity, Reliant on assistive device for balance Standing balance-Leahy Scale: Good Standing balance comment: supervision to cga                             Pertinent Vitals/Pain Pain Assessment Pain Assessment: No/denies pain    Home Living Family/patient expects to be discharged to:: Private residence Living Arrangements: Alone Available Help at Discharge: Family;Friend(s);Available PRN/intermittently Type of Home: House Home Access: Stairs to enter   Entrance Stairs-Number of Steps: 1   Home Layout: One level Home Equipment: Agricultural consultant (2 wheels);Cane - single point;Wheelchair - manual;Lift chair      Prior Function Prior Level of Function : Independent/Modified Independent;Driving             Mobility Comments: has R LE prosthetic. Normally does not use  AD in home, will use cane or walker when out as needed ADLs Comments: independent     Extremity/Trunk Assessment   Upper Extremity Assessment Upper Extremity Assessment: Overall WFL for tasks assessed    Lower Extremity Assessment Lower Extremity Assessment: Overall WFL for tasks assessed    Cervical / Trunk Assessment Cervical / Trunk Assessment: Normal   Communication   Communication Communication: No apparent difficulties    Cognition Arousal: Alert Behavior During Therapy: WFL for tasks assessed/performed   PT - Cognitive impairments: No apparent impairments                         Following commands: Intact       Cueing Cueing Techniques: Verbal cues     General Comments      Exercises     Assessment/Plan    PT Assessment Patient needs continued PT services  PT Problem List Decreased activity tolerance;Decreased balance;Decreased mobility       PT Treatment Interventions Patient/family education;Functional mobility training;Therapeutic activities;Balance training    PT Goals (Current goals can be found in the Care Plan section)  Acute Rehab PT Goals Patient Stated Goal: return home PT Goal Formulation: With patient Time For Goal Achievement: 11/10/23 Potential to Achieve Goals: Good    Frequency Min 2X/week     Co-evaluation               AM-PAC PT 6 Clicks Mobility  Outcome Measure Help needed turning from your back to your side while in a flat bed without using bedrails?: None Help needed moving from lying on your back to sitting on the side of a flat bed without using bedrails?: None Help needed moving to and from a bed to a chair (including a wheelchair)?: A Little Help needed standing up from a chair using your arms (e.g., wheelchair or bedside chair)?: A Little Help needed to walk in hospital room?: A Little Help needed climbing 3-5 steps with a railing? : A Little 6 Click Score: 20    End of Session   Activity Tolerance: Patient tolerated treatment well Patient left: in chair;with call bell/phone within reach;with chair alarm set Nurse Communication: Mobility status PT Visit Diagnosis: Unsteadiness on feet (R26.81);Other abnormalities of gait and mobility (R26.89)    Time: 1100-1116 PT Time Calculation (min) (ACUTE ONLY): 16 min   Charges:   PT Evaluation $PT Eval Low  Complexity: 1 Low   PT General Charges $$ ACUTE PT VISIT: 1 Visit         Odysseus Cada, PT, GCS 11/03/23,11:30 AM

## 2023-11-03 NOTE — Progress Notes (Signed)
 Daily Progress Note   Subjective  - 1 Day Post-Op  Status post right foot TMA day 1.  No complaints at this time.  Resting comfortably in chair with foot elevated.  Objective Vitals:   11/03/23 0000 11/03/23 0434 11/03/23 0810 11/03/23 0846  BP: 111/65 100/62 100/67 100/67  Pulse: 88 79 81 81  Resp: 18 16 17    Temp: 97.9 F (36.6 C) 97.9 F (36.6 C) 97.7 F (36.5 C)   TempSrc:   Oral   SpO2: 98% 97% 100%   Weight:      Height:        Physical Exam: Dressing change.  Wound is well coapted.  No dehiscence.  Good perfusion of skin flap.  Mild bloody drainage on bandage.  Minimal erythema.  Intraoperative culture showing rare gram-positive cocci.  Patient with history of MRSA infection in the past.  I have consulted infectious disease.  Nonpalpable pulses to the dorsalis pedis on right foot.  Skin flap well-perfused.  Good bleeding noted intraoperatively.       Laboratory CBC    Component Value Date/Time   WBC 6.0 11/03/2023 0523   HGB 9.9 (L) 11/03/2023 0523   HCT 30.5 (L) 11/03/2023 0523   PLT 200 11/03/2023 0523    BMET    Component Value Date/Time   NA 137 11/03/2023 0523   K 4.4 11/03/2023 0523   CL 108 11/03/2023 0523   CO2 21 (L) 11/03/2023 0523   GLUCOSE 177 (H) 11/03/2023 0523   GLUCOSE 402 (H) 09/17/2012 1417   BUN 17 11/03/2023 0523   CREATININE 1.22 11/03/2023 0523   CALCIUM 8.8 (L) 11/03/2023 0523   GFRNONAA >60 11/03/2023 0523   GFRAA 58 (L) 06/02/2015 0656    Assessment/Planning: Diabetes with peripheral vascular disease and gangrene Status post TMA right forefoot  Dressing changed today.  Skin is well coapted at this time.  No signs of active infection currently.  Erythema is minimal. Cultures with GPC's.  Infectious disease has been consulted with history of MRSA and osteomyelitis. Consultation of vascular surgery as well.  Patient high risk having undergone BKA on left side and now TMA on right.  That is post angio May 13. Physical  therapy is evaluating as well.  Patient in boot on the right side keeping right lower extremity protected.  Okay for heel weightbearing for transfer only.  Otherwise nonweightbearing right lower extremity. Will follow-up tomorrow.   [name] A  11/03/2023, 11:38 AM

## 2023-11-03 NOTE — Progress Notes (Signed)
 PHARMACY NOTE:  ANTIMICROBIAL RENAL DOSAGE ADJUSTMENT  Current antimicrobial regimen includes a mismatch between antimicrobial dosage and estimated renal function.  As per policy approved by the Pharmacy & Therapeutics and Medical Executive Committees, the antimicrobial dosage will be adjusted accordingly.  Current antimicrobial dosage:  Cefepime  2 gm IV q12h  Indication: wound cx  Renal Function:  Estimated Creatinine Clearance: 61.9 mL/min (by C-G formula based on SCr of 1.22 mg/dL).     Antimicrobial dosage has been changed to:  Cefepime  2 gm IV q8h  Additional comments:   Thank you for allowing pharmacy to be a part of this patient's care.  Allean Haas PharmD Clinical Pharmacist 11/03/2023

## 2023-11-04 DIAGNOSIS — L089 Local infection of the skin and subcutaneous tissue, unspecified: Secondary | ICD-10-CM | POA: Diagnosis not present

## 2023-11-04 DIAGNOSIS — I2510 Atherosclerotic heart disease of native coronary artery without angina pectoris: Secondary | ICD-10-CM

## 2023-11-04 DIAGNOSIS — Z951 Presence of aortocoronary bypass graft: Secondary | ICD-10-CM

## 2023-11-04 DIAGNOSIS — E1151 Type 2 diabetes mellitus with diabetic peripheral angiopathy without gangrene: Secondary | ICD-10-CM

## 2023-11-04 DIAGNOSIS — Z95820 Peripheral vascular angioplasty status with implants and grafts: Secondary | ICD-10-CM

## 2023-11-04 DIAGNOSIS — T8789 Other complications of amputation stump: Secondary | ICD-10-CM

## 2023-11-04 DIAGNOSIS — I4891 Unspecified atrial fibrillation: Secondary | ICD-10-CM

## 2023-11-04 DIAGNOSIS — E1159 Type 2 diabetes mellitus with other circulatory complications: Secondary | ICD-10-CM

## 2023-11-04 LAB — BASIC METABOLIC PANEL WITH GFR
Anion gap: 7 (ref 5–15)
BUN: 17 mg/dL (ref 8–23)
CO2: 23 mmol/L (ref 22–32)
Calcium: 8.5 mg/dL — ABNORMAL LOW (ref 8.9–10.3)
Chloride: 106 mmol/L (ref 98–111)
Creatinine, Ser: 1.2 mg/dL (ref 0.61–1.24)
GFR, Estimated: >60 mL/min (ref >60)
Glucose, Bld: 144 mg/dL — ABNORMAL HIGH (ref 70–99)
Potassium: 4.2 mmol/L (ref 3.5–5.1)
Sodium: 136 mmol/L (ref 135–145)

## 2023-11-04 LAB — CBC WITH DIFFERENTIAL/PLATELET
Abs Immature Granulocytes: 0.04 K/uL (ref 0.00–0.07)
Basophils Absolute: 0 K/uL (ref 0.0–0.1)
Basophils Relative: 0 %
Eosinophils Absolute: 0.2 K/uL (ref 0.0–0.5)
Eosinophils Relative: 3 %
HCT: 29.9 % — ABNORMAL LOW (ref 39.0–52.0)
Hemoglobin: 9.7 g/dL — ABNORMAL LOW (ref 13.0–17.0)
Immature Granulocytes: 1 %
Lymphocytes Relative: 15 %
Lymphs Abs: 1 K/uL (ref 0.7–4.0)
MCH: 29 pg (ref 26.0–34.0)
MCHC: 32.4 g/dL (ref 30.0–36.0)
MCV: 89.5 fL (ref 80.0–100.0)
Monocytes Absolute: 0.5 K/uL (ref 0.1–1.0)
Monocytes Relative: 7 %
Neutro Abs: 5 K/uL (ref 1.7–7.7)
Neutrophils Relative %: 74 %
Platelets: 224 K/uL (ref 150–400)
RBC: 3.34 MIL/uL — ABNORMAL LOW (ref 4.22–5.81)
RDW: 14.8 % (ref 11.5–15.5)
WBC: 6.7 K/uL (ref 4.0–10.5)
nRBC: 0 % (ref 0.0–0.2)

## 2023-11-04 LAB — GLUCOSE, CAPILLARY
Glucose-Capillary: 100 mg/dL — ABNORMAL HIGH (ref 70–99)
Glucose-Capillary: 136 mg/dL — ABNORMAL HIGH (ref 70–99)
Glucose-Capillary: 139 mg/dL — ABNORMAL HIGH (ref 70–99)
Glucose-Capillary: 136 mg/dL — ABNORMAL HIGH (ref 70–99)

## 2023-11-04 MED ORDER — QUETIAPINE FUMARATE 25 MG PO TABS
25.0000 mg | ORAL_TABLET | Freq: Two times a day (BID) | ORAL | Status: DC | PRN
  Administered 2023-11-04 – 2023-11-07 (×4): 25 mg via ORAL
  Filled 2023-11-04 (×4): qty 1

## 2023-11-04 MED ORDER — ENOXAPARIN SODIUM 80 MG/0.8ML IJ SOSY
1.0000 mg/kg | PREFILLED_SYRINGE | Freq: Two times a day (BID) | INTRAMUSCULAR | Status: DC
  Filled 2023-11-04: qty 0.82

## 2023-11-04 MED ORDER — ENOXAPARIN SODIUM 80 MG/0.8ML IJ SOSY
1.0000 mg/kg | PREFILLED_SYRINGE | Freq: Two times a day (BID) | INTRAMUSCULAR | Status: AC
  Administered 2023-11-04: 82.5 mg via SUBCUTANEOUS
  Filled 2023-11-04: qty 0.82

## 2023-11-04 NOTE — Progress Notes (Signed)
 Progress Note   [name] FMW:[id number] DOB: 01-13-1951 DOA: 11/01/2023     3 DOS: the patient was seen and examined on 11/04/2023    Brief hospital course: From HPI Andrew Harding is a 73 y.o. 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 Ashley.  Patient reports compliance with his daily dressing changes.  He denies any improvement in his drainage or appearance of the foot since being on outpatient antibiotics.  Pain is adequately controlled. Denies any other acute issues.       Assessment and Plan:    Ulcer of right foot with bone involvement  s/p right first to amputation with poor wound healing  Patient underwent surgery by podiatry on 11/02/2023 - Continue to hold eliquis  and plavix  Advance diet after surgery - optimize glucose control  Continue vancomycin  and cefepime  Vascular surgery planning angiography tomorrow Continue PT OT   CAD s/p CABG PAF on eliquis   - hold eliquis  and plavix . Last dose this am.   - anticoagulate with lovenox  tonight  Continue metoprolol  and statin   IDT2DM Continue monitoring glucose Continue insulin  therapy    Advance Care Planning:   Code Status: Full Code discussed with patient at time of admission   Consults: Podiatry   Subjective:  Patient seen and examined at bedside this morning Patient underwent surgical intervention on 11/02/2023 He denies worsening foot pain nausea vomiting abdominal pain   Physical Exam:     General: He is not in acute distress.    Appearance: He is not ill-appearing or toxic-appearing.  HENT:     Head: Normocephalic and atraumatic.     Mouth/Throat:     Mouth: Mucous membranes are moist.  Eyes:     Extraocular  Movements: Extraocular movements intact.     Pupils: Pupils are equal, round, and reactive to light.  Cardiovascular:     Rate and Rhythm: Normal rate and regular rhythm.  Pulmonary:     Effort: Pulmonary effort is normal. No respiratory distress.     Breath sounds: Normal breath sounds. No wheezing.  Abdominal:     General: Abdomen is flat. Bowel sounds are normal. There is no distension.     Palpations: Abdomen is soft.     Tenderness: There is no abdominal tenderness.  Musculoskeletal:     Cervical back: Normal range of motion and neck supple.     Right lower leg: No edema.     Comments: L BKA, prosthetic in place Right foot wrapped in immobilizer, direct examination deferred  Skin:    Capillary Refill: Capillary refill takes less than 2 seconds.  Neurological:     General: No focal deficit present.     Mental Status: He is alert.  Psychiatric:        Mood and Affect: Mood normal.        Behavior: Behavior normal.      Data Reviewed:      Vitals:   11/03/23 2104 11/04/23 0527 11/04/23 0813 11/04/23 1639  BP: 126/72 116/61 117/74 139/77  Pulse: 76 89 82 94  Resp: 18 18 16 17   Temp: 98 F (36.7 C) 97.9 F (36.6 C) 98.7 F (37.1 C) 98.6 F (37 C)  TempSrc:  SpO2: 99% 100% 100% 100%  Weight:      Height:          Latest Ref Rng & Units 11/04/2023    4:41 AM 11/03/2023    5:23 AM 11/02/2023    5:21 AM  CBC  WBC 4.0 - 10.5 K/uL 6.7  6.0  5.7   Hemoglobin 13.0 - 17.0 g/dL 9.7  9.9  89.6   Hematocrit 39.0 - 52.0 % 29.9  30.5  31.8   Platelets 150 - 400 K/uL 224  200  218        Latest Ref Rng & Units 11/04/2023    4:41 AM 11/03/2023    5:23 AM 11/02/2023    5:21 AM  BMP  Glucose 70 - 99 mg/dL 855  822  88   BUN 8 - 23 mg/dL 17  17  18    Creatinine 0.61 - 1.24 mg/dL 8.79  8.77  8.49   Sodium 135 - 145 mmol/L 136  137  138   Potassium 3.5 - 5.1 mmol/L 4.2  4.4  4.0   Chloride 98 - 111 mmol/L 106  108  105   CO2 22 - 32 mmol/L 23  21  23    Calcium 8.9 - 10.3 mg/dL  8.5  8.8  9.0      Author: Drue ONEIDA Potter, MD 11/04/2023 5:48 PM  For on call review [email or web address].

## 2023-11-04 NOTE — Plan of Care (Signed)

## 2023-11-04 NOTE — H&P (View-Only) (Signed)
 [facility]  Patient name: Andrew Harding MRN: [id number] DOB: 30-Nov-1950 Sex: male   REQUESTING PROVIDER:    Dr . Ashley   REASON FOR CONSULT:    Non-healing right TMA  HISTORY OF PRESENT ILLNESS:   Andrew Harding is a 73 y.o. male, who is status post angiogram with right popliteal artery angioplasty and stenting of the right anterior tibial artery.  He has subsequently undergone transmetatarsal amputation.  There has been some drainage from the amputation site.  He is worried about wound healing, as he has a left below-knee amputation.   The patient is anticoagulated for atrial fibrillation.  He has a history of coronary artery disease, status post CABG.  He is a diabetic.  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) 09/03/2023   Atrial fibrillation with RVR (HCC) 08/19/2022   Bladder neck obstruction    Cellulitis 08/17/2022   Chronic kidney disease    Coronary artery disease    a.) s/p 4v CABG in 2014   Diabetes mellitus without complication (HCC)    Diabetic neuropathy (HCC)    Diabetic peripheral neuropathy (HCC)    Diabetic ulcer of toe of left foot associated with diabetes mellitus of other type, limited to breakdown of skin (HCC) 12/21/2017   Diverticulosis    Gout    Gram-negative bacteremia 05/11/2023   Heart murmur    History of osteomyelitis 05/31/2015   Hypercholesteremia    Hyperlipidemia    Hypertension    Hypotension due to hypovolemia 08/17/2022   Infection of left foot 08/19/2022   MSSA bacteremia 02/28/2023   Open wound of left foot with complication 05/10/2023   Osteomyelitis (HCC) 05/31/2015   Peripheral neuropathy    Postural dizziness with presyncope  02/26/2023   S/P BKA (below knee amputation) unilateral, 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   S/P transmetatarsal amputation of foot, left (HCC) 08/29/2022   Sepsis (HCC) 08/17/2022   Sepsis (HCC) 05/10/2023   Status post amputation of toe of right foot (HCC) 11/01/2015   Tubular adenoma    Vitamin D  deficiency      FAMILY HISTORY   Family History  Problem Relation Age of Onset   Diabetes Mellitus II Mother    CAD Mother     SOCIAL HISTORY:   Social History   Socioeconomic History   Marital status: Divorced    Spouse name: Ashley,Angela C   Number of children: Not on file   Years of education: Not on file   Highest education level: Not on file  Occupational History   Not on file  Tobacco Use   Smoking status: Former    Types: Cigars   Smokeless tobacco: Never  Vaping Use   Vaping status: Never Used  Substance and Sexual Activity   Alcohol use: No   Drug use: No   Sexual activity: Never  Other Topics Concern   Not on file  Social History Narrative   Patient is legally separated and lives at home by himself. His ex -wife is his HCPOA. Neighbors and friends are his support system. He used to work for [employer]    Social Drivers of Corporate investment banker Strain: Low Risk  (  12/13/2022)   Received from [facility]   Overall Financial Resource Strain (CARDIA)    Difficulty of Paying Living Expenses: Not hard at all  Food Insecurity: No Food Insecurity (11/01/2023)   Hunger Vital Sign    Worried About Running Out of Food in the Last Year: Never true    Ran Out of Food in the Last Year: Never true  Transportation Needs: No Transportation Needs (11/01/2023)   PRAPARE - Administrator, Civil Service (Medical): No    Lack of Transportation (Non-Medical): No  Physical Activity: Not on file  Stress: Not on file  Social Connections: Moderately Isolated (11/01/2023)   Social Connection and Isolation Panel     Frequency of Communication with Friends and Family: More than three times a week    Frequency of Social Gatherings with Friends and Family: Twice a week    Attends Religious Services: More than 4 times per year    Active Member of [employer] or Organizations: No    Attends Banker Meetings: Never    Marital Status: Divorced  Catering manager Violence: Not At Risk (11/01/2023)   Humiliation, Afraid, Rape, and Kick questionnaire    Fear of Current or Ex-Partner: No    Emotionally Abused: No    Physically Abused: No    Sexually Abused: No    ALLERGIES:    No Known Allergies  CURRENT MEDICATIONS:    Current Facility-Administered Medications  Medication Dose Route Frequency Provider Last Rate Last Admin   acetaminophen  (TYLENOL ) tablet 325-650 mg  325-650 mg Oral Q4H PRN [name], DPM       ceFEPIme  (MAXIPIME ) 2 g in sodium chloride  0.9 % 100 mL IVPB  2 g Intravenous Q8H Merrill, Kristin A, RPH 200 mL/hr at 11/04/23 0426 2 g at 11/04/23 0426   DAPTOmycin  (CUBICIN ) IVPB 700 mg/127mL premix  8 mg/kg Intravenous Q1400 Luiz Channel, MD   Stopped at 11/03/23 1905   docusate sodium  (COLACE) capsule 100 mg  100 mg Oral BID PRN [name], DPM       enoxaparin  (LOVENOX ) injection 85 mg  1 mg/kg Subcutaneous Q12H [name], DPM   85 mg at 11/04/23 9145   gabapentin  (NEURONTIN ) capsule 300 mg  300 mg Oral TID Fowler, Justin, DPM   300 mg at 11/04/23 0854   insulin  aspart (novoLOG ) injection 0-15 Units  0-15 Units Subcutaneous TID WC Fowler, Justin, DPM   2 Units at 11/03/23 1710   insulin  aspart (novoLOG ) injection 0-5 Units  0-5 Units Subcutaneous QHS [name], DPM       insulin  glargine-yfgn (SEMGLEE ) injection 28 Units  28 Units Subcutaneous QHS [name], DPM   28 Units at 11/03/23 2229   metoprolol  tartrate (LOPRESSOR ) tablet 25 mg  25 mg Oral BID [name], DPM   25 mg at 11/04/23 9145   oxyCODONE  (Oxy IR/ROXICODONE ) immediate release tablet 5 mg  5 mg  Oral Q6H PRN [name], DPM        REVIEW OF SYSTEMS:   [X]  denotes positive finding, [ ]  denotes negative finding Cardiac  Comments:  Chest pain or chest pressure:    Shortness of breath upon exertion:    Short of breath when lying flat:    Irregular heart rhythm:        Vascular    Pain in calf, thigh, or hip brought on by ambulation:    Pain in feet at night that wakes you up from your sleep:  Blood clot in your veins:    Leg swelling:         Pulmonary    Oxygen at home:    Productive cough:     Wheezing:         Neurologic    Sudden weakness in arms or legs:     Sudden numbness in arms or legs:     Sudden onset of difficulty speaking or slurred speech:    Temporary loss of vision in one eye:     Problems with dizziness:         Gastrointestinal    Blood in stool:      Vomited blood:         Genitourinary    Burning when urinating:     Blood in urine:        Psychiatric    Major depression:         Hematologic    Bleeding problems:    Problems with blood clotting too easily:        Skin    Rashes or ulcers:        Constitutional    Fever or chills:     PHYSICAL EXAM:   Vitals:   11/03/23 1612 11/03/23 2104 11/04/23 0527 11/04/23 0813  BP: 104/61 126/72 116/61 117/74  Pulse: 76 76 89 82  Resp: 17 18 18 16   Temp: 97.8 F (36.6 C) 98 F (36.7 C) 97.9 F (36.6 C) 98.7 F (37.1 C)  TempSrc: Oral     SpO2: 100% 99% 100% 100%  Weight:      Height:        GENERAL: The patient is a well-nourished male, in no acute distress. The vital signs are documented above. CARDIAC: There is a regular rate and rhythm.  PULMONARY: Nonlabored respirations ABDOMEN: Soft and non-tender MUSCULOSKELETAL: There are no major deformities or cyanosis. NEUROLOGIC: No focal weakness or paresthesias are detected. PSYCHIATRIC: The patient has a normal affect.       ASSESSMENT and PLAN   Status post right transmetatarsal amputation.  The patient has a  history of recent angiography with popliteal and tibial intervention.  There was concerned about wound healing.  The patient is very eager to have repeat angiography to confirm that his blood flow is optimized.  This appears to be reasonable.  I will make him n.p.o. after midnight and we will schedule angiography tomorrow  I am holding his morning dose of Lovenox   Malvina Serene CLORE, MD, FACS [facility] [phone] Pager [phone]

## 2023-11-04 NOTE — Progress Notes (Addendum)
 Vascular and Vein Specialist of [facility]  Patient name: Andrew Harding MRN: [id number] DOB: 19-Jan-1951 Sex: male   REQUESTING PROVIDER:    Dr . Ashley   REASON FOR CONSULT:    Non-healing right TMA  HISTORY OF PRESENT ILLNESS:   Andrew Harding is a 73 y.o. male, who is status post angiogram with right popliteal artery angioplasty and stenting of the right anterior tibial artery.  He has subsequently undergone transmetatarsal amputation.  There has been some drainage from the amputation site.  He is worried about wound healing, as he has a left below-knee amputation.   The patient is anticoagulated for atrial fibrillation.  He has a history of coronary artery disease, status post CABG.  He is a diabetic.  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) 09/03/2023   Atrial fibrillation with RVR (HCC) 08/19/2022   Bladder neck obstruction    Cellulitis 08/17/2022   Chronic kidney disease    Coronary artery disease    a.) s/p 4v CABG in 2014   Diabetes mellitus without complication (HCC)    Diabetic neuropathy (HCC)    Diabetic peripheral neuropathy (HCC)    Diabetic ulcer of toe of left foot associated with diabetes mellitus of other type, limited to breakdown of skin (HCC) 12/21/2017   Diverticulosis    Gout    Gram-negative bacteremia 05/11/2023   Heart murmur    History of osteomyelitis 05/31/2015   Hypercholesteremia    Hyperlipidemia    Hypertension    Hypotension due to hypovolemia 08/17/2022   Infection of left foot 08/19/2022   MSSA bacteremia 02/28/2023   Open wound of left foot with complication 05/10/2023   Osteomyelitis (HCC) 05/31/2015   Peripheral neuropathy    Postural dizziness with presyncope  02/26/2023   S/P BKA (below knee amputation) unilateral, 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   S/P transmetatarsal amputation of foot, left (HCC) 08/29/2022   Sepsis (HCC) 08/17/2022   Sepsis (HCC) 05/10/2023   Status post amputation of toe of right foot (HCC) 11/01/2015   Tubular adenoma    Vitamin D  deficiency      FAMILY HISTORY   Family History  Problem Relation Age of Onset   Diabetes Mellitus II Mother    CAD Mother     SOCIAL HISTORY:   Social History   Socioeconomic History   Marital status: Divorced    Spouse name: Ashley,Angela C   Number of children: Not on file   Years of education: Not on file   Highest education level: Not on file  Occupational History   Not on file  Tobacco Use   Smoking status: Former    Types: Cigars   Smokeless tobacco: Never  Vaping Use   Vaping status: Never Used  Substance and Sexual Activity   Alcohol use: No   Drug use: No   Sexual activity: Never  Other Topics Concern   Not on file  Social History Narrative   Patient is legally separated and lives at home by himself. His ex -wife is his HCPOA. Neighbors and friends are his support system. He used to work for [employer]    Social Drivers of Corporate investment banker Strain: Low Risk  (  12/13/2022)   Received from [facility]   Overall Financial Resource Strain (CARDIA)    Difficulty of Paying Living Expenses: Not hard at all  Food Insecurity: No Food Insecurity (11/01/2023)   Hunger Vital Sign    Worried About Running Out of Food in the Last Year: Never true    Ran Out of Food in the Last Year: Never true  Transportation Needs: No Transportation Needs (11/01/2023)   PRAPARE - Administrator, Civil Service (Medical): No    Lack of Transportation (Non-Medical): No  Physical Activity: Not on file  Stress: Not on file  Social Connections: Moderately Isolated (11/01/2023)   Social Connection and Isolation Panel     Frequency of Communication with Friends and Family: More than three times a week    Frequency of Social Gatherings with Friends and Family: Twice a week    Attends Religious Services: More than 4 times per year    Active Member of [employer] or Organizations: No    Attends Banker Meetings: Never    Marital Status: Divorced  Catering manager Violence: Not At Risk (11/01/2023)   Humiliation, Afraid, Rape, and Kick questionnaire    Fear of Current or Ex-Partner: No    Emotionally Abused: No    Physically Abused: No    Sexually Abused: No    ALLERGIES:    No Known Allergies  CURRENT MEDICATIONS:    Current Facility-Administered Medications  Medication Dose Route Frequency Provider Last Rate Last Admin   acetaminophen  (TYLENOL ) tablet 325-650 mg  325-650 mg Oral Q4H PRN [name], DPM       ceFEPIme  (MAXIPIME ) 2 g in sodium chloride  0.9 % 100 mL IVPB  2 g Intravenous Q8H Merrill, Kristin A, RPH 200 mL/hr at 11/04/23 0426 2 g at 11/04/23 0426   DAPTOmycin  (CUBICIN ) IVPB 700 mg/154mL premix  8 mg/kg Intravenous Q1400 Luiz Channel, MD   Stopped at 11/03/23 1905   docusate sodium  (COLACE) capsule 100 mg  100 mg Oral BID PRN [name], DPM       enoxaparin  (LOVENOX ) injection 85 mg  1 mg/kg Subcutaneous Q12H [name], DPM   85 mg at 11/04/23 9145   gabapentin  (NEURONTIN ) capsule 300 mg  300 mg Oral TID Fowler, Justin, DPM   300 mg at 11/04/23 0854   insulin  aspart (novoLOG ) injection 0-15 Units  0-15 Units Subcutaneous TID WC [name], DPM   2 Units at 11/03/23 1710   insulin  aspart (novoLOG ) injection 0-5 Units  0-5 Units Subcutaneous QHS [name], DPM       insulin  glargine-yfgn (SEMGLEE ) injection 28 Units  28 Units Subcutaneous QHS [name], DPM   28 Units at 11/03/23 2229   metoprolol  tartrate (LOPRESSOR ) tablet 25 mg  25 mg Oral BID [name], DPM   25 mg at 11/04/23 9145   oxyCODONE  (Oxy IR/ROXICODONE ) immediate release tablet 5 mg  5 mg  Oral Q6H PRN [name], DPM        REVIEW OF SYSTEMS:   [X]  denotes positive finding, [ ]  denotes negative finding Cardiac  Comments:  Chest pain or chest pressure:    Shortness of breath upon exertion:    Short of breath when lying flat:    Irregular heart rhythm:        Vascular    Pain in calf, thigh, or hip brought on by ambulation:    Pain in feet at night that wakes you up from your sleep:  Blood clot in your veins:    Leg swelling:         Pulmonary    Oxygen at home:    Productive cough:     Wheezing:         Neurologic    Sudden weakness in arms or legs:     Sudden numbness in arms or legs:     Sudden onset of difficulty speaking or slurred speech:    Temporary loss of vision in one eye:     Problems with dizziness:         Gastrointestinal    Blood in stool:      Vomited blood:         Genitourinary    Burning when urinating:     Blood in urine:        Psychiatric    Major depression:         Hematologic    Bleeding problems:    Problems with blood clotting too easily:        Skin    Rashes or ulcers:        Constitutional    Fever or chills:     PHYSICAL EXAM:   Vitals:   11/03/23 1612 11/03/23 2104 11/04/23 0527 11/04/23 0813  BP: 104/61 126/72 116/61 117/74  Pulse: 76 76 89 82  Resp: 17 18 18 16   Temp: 97.8 F (36.6 C) 98 F (36.7 C) 97.9 F (36.6 C) 98.7 F (37.1 C)  TempSrc: Oral     SpO2: 100% 99% 100% 100%  Weight:      Height:        GENERAL: The patient is a well-nourished male, in no acute distress. The vital signs are documented above. CARDIAC: There is a regular rate and rhythm.  PULMONARY: Nonlabored respirations ABDOMEN: Soft and non-tender MUSCULOSKELETAL: There are no major deformities or cyanosis. NEUROLOGIC: No focal weakness or paresthesias are detected. PSYCHIATRIC: The patient has a normal affect.       ASSESSMENT and PLAN   Status post right transmetatarsal amputation.  The patient has a  history of recent angiography with popliteal and tibial intervention.  There was concerned about wound healing.  The patient is very eager to have repeat angiography to confirm that his blood flow is optimized.  This appears to be reasonable.  I will make him n.p.o. after midnight and we will schedule angiography tomorrow  I am holding his morning dose of Lovenox   Malvina Serene CLORE, MD, FACS [facility] Specialists of [facility] [phone] Pager [phone]

## 2023-11-04 NOTE — Progress Notes (Signed)
 Daily Progress Note   Subjective  - 2 Days Post-Op  Follow-up right foot TMA.  No complaints today.  Objective Vitals:   11/03/23 1612 11/03/23 2104 11/04/23 0527 11/04/23 0813  BP: 104/61 126/72 116/61 117/74  Pulse: 76 76 89 82  Resp: 17 18 18 16   Temp: 97.8 F (36.6 C) 98 F (36.7 C) 97.9 F (36.6 C) 98.7 F (37.1 C)  TempSrc: Oral     SpO2: 100% 99% 100% 100%  Weight:      Height:        Physical Exam: Wound is stable.  Good perfusion to the flaps at this time.  Mild drainage bloody drainage along wound site.  Intraoperative cultures showing few gram-positive cocci.  Still waiting on ID and sensitivities.  Laboratory CBC    Component Value Date/Time   WBC 6.7 11/04/2023 0441   HGB 9.7 (L) 11/04/2023 0441   HCT 29.9 (L) 11/04/2023 0441   PLT 224 11/04/2023 0441    BMET    Component Value Date/Time   NA 136 11/04/2023 0441   K 4.2 11/04/2023 0441   CL 106 11/04/2023 0441   CO2 23 11/04/2023 0441   GLUCOSE 144 (H) 11/04/2023 0441   GLUCOSE 402 (H) 09/17/2012 1417   BUN 17 11/04/2023 0441   CREATININE 1.20 11/04/2023 0441   CALCIUM 8.5 (L) 11/04/2023 0441   GFRNONAA >60 11/04/2023 0441   GFRAA 58 (L) 06/02/2015 0656    Assessment/Planning: Status post right foot transmetatarsal amputation Recent left-sided below the knee amputation Diabetes with peripheral vascular disease and profound neuropathy  Incision is stable at this time.  Mild drainage from wound as expected.  No purulence noted. Appreciate infectious disease assistance with this.  We are still awaiting cultures and pathology and final recommendations for antibiotics.  Tissue swab and bone intraoperatively was sent for culture and and bone sent to pathology. Physical therapy has seen and evaluated.  Patient will likely at least need home health physical therapy and/or nursing if PICC is warranted..  May be candidate for SNF.  Patient expresses resistance to SNF placement but willing if this is  the definitive recommendation. I have consulted vascular surgery.  History of recent vascular invention to the right lower extremity. Will continue to follow   Andrew Harding LABOR  11/04/2023, 10:51 AM

## 2023-11-05 ENCOUNTER — Encounter
Admission: EM | Disposition: A | Discharge status: Skilled Nursing Facility | Payer: Self-pay | Source: Home / Self Care | Attending: Internal Medicine

## 2023-11-05 DIAGNOSIS — I70235 Atherosclerosis of native arteries of right leg with ulceration of other part of foot: Secondary | ICD-10-CM | POA: Diagnosis not present

## 2023-11-05 DIAGNOSIS — I739 Peripheral vascular disease, unspecified: Secondary | ICD-10-CM

## 2023-11-05 DIAGNOSIS — Z9889 Other specified postprocedural states: Secondary | ICD-10-CM | POA: Diagnosis not present

## 2023-11-05 DIAGNOSIS — M86171 Other acute osteomyelitis, right ankle and foot: Secondary | ICD-10-CM

## 2023-11-05 DIAGNOSIS — Z89512 Acquired absence of left leg below knee: Secondary | ICD-10-CM

## 2023-11-05 DIAGNOSIS — T82868A Thrombosis of vascular prosthetic devices, implants and grafts, initial encounter: Secondary | ICD-10-CM | POA: Diagnosis not present

## 2023-11-05 DIAGNOSIS — Z89411 Acquired absence of right great toe: Secondary | ICD-10-CM

## 2023-11-05 DIAGNOSIS — E11628 Type 2 diabetes mellitus with other skin complications: Secondary | ICD-10-CM

## 2023-11-05 DIAGNOSIS — L97519 Non-pressure chronic ulcer of other part of right foot with unspecified severity: Secondary | ICD-10-CM | POA: Diagnosis not present

## 2023-11-05 DIAGNOSIS — L089 Local infection of the skin and subcutaneous tissue, unspecified: Secondary | ICD-10-CM | POA: Diagnosis not present

## 2023-11-05 HISTORY — PX: ABDOMINAL AORTOGRAM W/LOWER EXTREMITY: CATH118223

## 2023-11-05 LAB — CBC WITH DIFFERENTIAL/PLATELET
Abs Immature Granulocytes: 0.05 K/uL (ref 0.00–0.07)
Basophils Absolute: 0 K/uL (ref 0.0–0.1)
Basophils Relative: 1 %
Eosinophils Absolute: 0.2 K/uL (ref 0.0–0.5)
Eosinophils Relative: 2 %
HCT: 30.2 % — ABNORMAL LOW (ref 39.0–52.0)
Hemoglobin: 9.9 g/dL — ABNORMAL LOW (ref 13.0–17.0)
Immature Granulocytes: 1 %
Lymphocytes Relative: 13 %
Lymphs Abs: 1 K/uL (ref 0.7–4.0)
MCH: 29.9 pg (ref 26.0–34.0)
MCHC: 32.8 g/dL (ref 30.0–36.0)
MCV: 91.2 fL (ref 80.0–100.0)
Monocytes Absolute: 0.6 K/uL (ref 0.1–1.0)
Monocytes Relative: 8 %
Neutro Abs: 6.2 K/uL (ref 1.7–7.7)
Neutrophils Relative %: 75 %
Platelets: 236 K/uL (ref 150–400)
RBC: 3.31 MIL/uL — ABNORMAL LOW (ref 4.22–5.81)
RDW: 14.6 % (ref 11.5–15.5)
WBC: 8.1 K/uL (ref 4.0–10.5)
nRBC: 0 % (ref 0.0–0.2)

## 2023-11-05 LAB — GLUCOSE, CAPILLARY
Glucose-Capillary: 81 mg/dL (ref 70–99)
Glucose-Capillary: 116 mg/dL — ABNORMAL HIGH (ref 70–99)
Glucose-Capillary: 124 mg/dL — ABNORMAL HIGH (ref 70–99)
Glucose-Capillary: 197 mg/dL — ABNORMAL HIGH (ref 70–99)
Glucose-Capillary: 206 mg/dL — ABNORMAL HIGH (ref 70–99)

## 2023-11-05 LAB — BASIC METABOLIC PANEL WITH GFR
Anion gap: 7 (ref 5–15)
BUN: 20 mg/dL (ref 8–23)
CO2: 24 mmol/L (ref 22–32)
Calcium: 8.6 mg/dL — ABNORMAL LOW (ref 8.9–10.3)
Chloride: 105 mmol/L (ref 98–111)
Creatinine, Ser: 1.23 mg/dL (ref 0.61–1.24)
GFR, Estimated: >60 mL/min (ref >60)
Glucose, Bld: 144 mg/dL — ABNORMAL HIGH (ref 70–99)
Potassium: 4.1 mmol/L (ref 3.5–5.1)
Sodium: 136 mmol/L (ref 135–145)

## 2023-11-05 SURGERY — ABDOMINAL AORTOGRAM W/LOWER EXTREMITY
Anesthesia: IV Sedation (MBSC Only)

## 2023-11-05 MED ORDER — HEPARIN (PORCINE) IN NACL 1000-0.9 UT/500ML-% IV SOLN
INTRAVENOUS | Status: DC | PRN
  Administered 2023-11-05: 1000 mL

## 2023-11-05 MED ORDER — IODIXANOL 320 MG/ML IV SOLN
INTRAVENOUS | Status: DC | PRN
  Administered 2023-11-05: 80 mL via INTRA_ARTERIAL

## 2023-11-05 MED ORDER — APIXABAN 5 MG PO TABS
5.0000 mg | ORAL_TABLET | Freq: Two times a day (BID) | ORAL | Status: DC
  Administered 2023-11-05 – 2023-11-08 (×6): 5 mg via ORAL
  Filled 2023-11-05 (×8): qty 1

## 2023-11-05 MED ORDER — LIDOCAINE-EPINEPHRINE (PF) 1 %-1:200000 IJ SOLN
INTRAMUSCULAR | Status: DC | PRN
  Administered 2023-11-05: 10 mL via INTRADERMAL

## 2023-11-05 MED ORDER — FENTANYL CITRATE PF 50 MCG/ML IJ SOSY
PREFILLED_SYRINGE | INTRAMUSCULAR | Status: AC
  Filled 2023-11-05: qty 1

## 2023-11-05 MED ORDER — CLOPIDOGREL BISULFATE 75 MG PO TABS
75.0000 mg | ORAL_TABLET | Freq: Every day | ORAL | Status: DC
  Administered 2023-11-06 – 2023-11-08 (×3): 75 mg via ORAL
  Filled 2023-11-05 (×4): qty 1

## 2023-11-05 MED ORDER — HEPARIN SODIUM (PORCINE) 1000 UNIT/ML IJ SOLN
INTRAMUSCULAR | Status: DC | PRN
  Administered 2023-11-05: 5000 [IU] via INTRAVENOUS

## 2023-11-05 MED ORDER — HEPARIN SODIUM (PORCINE) 1000 UNIT/ML IJ SOLN
INTRAMUSCULAR | Status: AC
  Filled 2023-11-05: qty 10

## 2023-11-05 MED ORDER — CEFAZOLIN SODIUM-DEXTROSE 2-4 GM/100ML-% IV SOLN
2.0000 g | Freq: Once | INTRAVENOUS | Status: AC
  Administered 2023-11-05: 2 g via INTRAVENOUS

## 2023-11-05 MED ORDER — MIDAZOLAM HCL 2 MG/2ML IJ SOLN
INTRAMUSCULAR | Status: AC
  Filled 2023-11-05: qty 2

## 2023-11-05 MED ORDER — ENOXAPARIN SODIUM 80 MG/0.8ML IJ SOSY
80.0000 mg | PREFILLED_SYRINGE | Freq: Two times a day (BID) | INTRAMUSCULAR | Status: DC
  Filled 2023-11-05: qty 0.8

## 2023-11-05 MED ORDER — FENTANYL CITRATE (PF) 100 MCG/2ML IJ SOLN
INTRAMUSCULAR | Status: DC | PRN
  Administered 2023-11-05: 50 ug via INTRAVENOUS
  Administered 2023-11-05: 25 ug via INTRAVENOUS
  Administered 2023-11-05 (×2): 12.5 ug via INTRAVENOUS

## 2023-11-05 MED ORDER — MIDAZOLAM HCL 2 MG/2ML IJ SOLN
INTRAMUSCULAR | Status: DC | PRN
  Administered 2023-11-05 (×2): .5 mg via INTRAVENOUS
  Administered 2023-11-05: 1 mg via INTRAVENOUS
  Administered 2023-11-05: 2 mg via INTRAVENOUS

## 2023-11-05 SURGICAL SUPPLY — 24 items
BALLOON JADE .014 3.5 X 40 (BALLOONS) IMPLANT
BALLOON LUTONIX 018 4X40X130 (BALLOONS) IMPLANT
BALLOON LUTONIX 018 5X40X130 (BALLOONS) IMPLANT
BALLOON ULTRVRSE 2.5X150X150 (BALLOONS) IMPLANT
BALLOON ULTRVRSE 3X300X150 (BALLOONS) IMPLANT
CANISTER PENUMBRA ENGINE (MISCELLANEOUS) IMPLANT
CATH ANGIO 5F PIGTAIL 65CM (CATHETERS) IMPLANT
CATH CXI SUPP ANG 4FR 135 (CATHETERS) IMPLANT
CATH LIGHTNING BOLT 6 XTX (CATHETERS) IMPLANT
CATH SEEKER .018X150 (CATHETERS) IMPLANT
COVER PROBE ULTRASOUND 5X96 (MISCELLANEOUS) IMPLANT
DEVICE PRESTO INFLATION (MISCELLANEOUS) IMPLANT
DEVICE STARCLOSE SE CLOSURE (Vascular Products) IMPLANT
GLIDEWIRE ADV .014X300CM (WIRE) IMPLANT
GLIDEWIRE ADV .035X260CM (WIRE) IMPLANT
SHEATH BRITE TIP 5FRX11 (SHEATH) IMPLANT
SHEATH RAABE 6FRX70 (SHEATH) IMPLANT
STENT ESPRIT BTK 3.0X38 SCAFF (Permanent Stent) IMPLANT
STENT ESPRIT BTK 3.5X38 SCAFF (Permanent Stent) IMPLANT
STENT VIABAHN 6X25X120 (Permanent Stent) IMPLANT
SYR MEDRAD MARK 7 150ML (SYRINGE) IMPLANT
TUBING CONTRAST HIGH PRESS 72 (TUBING) IMPLANT
WIRE COMMAND ST ANG 014 300 (WIRE) IMPLANT
WIRE J 3MM .035X145CM (WIRE) IMPLANT

## 2023-11-05 NOTE — Interval H&P Note (Signed)
 History and Physical Interval Note:  11/05/2023 8:57 AM  [name] Feeling  has presented today for surgery, with the diagnosis of pad.  The various methods of treatment have been discussed with the patient and family. After consideration of risks, benefits and other options for treatment, the patient has consented to  Procedure(s): ABDOMINAL AORTOGRAM W/LOWER EXTREMITY (N/A) as a surgical intervention.  The patient's history has been reviewed, patient examined, no change in status, stable for surgery.  I have reviewed the patient's chart and labs.  Questions were answered to the patient's satisfaction.     Andrew Harding

## 2023-11-05 NOTE — Op Note (Signed)
 [facility]  Percutaneous Study/Intervention Procedural Note   Date of Surgery: 11/05/2023  Surgeon(s):Audra Kagel    Assistants:none  Pre-operative Diagnosis: PAD with ulceration RLE  Post-operative diagnosis:  Same  Procedure(s) Performed:             1.  Ultrasound guidance for vascular access left femoral artery             2.  Catheter placement into right common femoral artery from left femoral approach             3.  Aortogram and selective right lower extremity angiogram             4.  Mechanical thrombectomy using the penumbra 6 bolt device in the right anterior tibial artery             5.  Angioplasty of the right anterior tibial artery with 2.5 mm diameter and 3 mm diameter angioplasty balloons in the midsegment and a 4 mm diameter Lutonix drug-coated balloon proximally  6.  Stent placement x 2 to the right anterior tibial artery with a 3 mm diameter by 38 mm length Esprit scaffolding system and a 3.5 mm diameter by 38 mm length Esprit scaffolding system  7.  Angioplasty of the right distal popliteal artery and tibioperoneal trunk with 5 mm diameter by 6 cm length Lutonix drug-coated angioplasty balloon  8.  Stent placement to the right tibioperoneal trunk with 6 mm diameter by 2.5 cm length Viabahn stent             9.  StarClose closure device left femoral artery  EBL: 75 cc  Contrast: 80 cc  Fluoro Time: 16.8 minutes  Moderate Conscious Sedation Time: approximately 88 minutes using 4 mg of Versed  and 100 mcg of Fentanyl               Indications:  Patient is a 73 y.o.male with nonhealing ulceration on the right foot and loss of his anterior tibial pulse since his last intervention with continued poor healing of the right foot wound.  The patient is brought in for angiography for further evaluation and potential treatment.  Due to the limb threatening nature of the situation, angiogram was performed for attempted limb salvage. The patient is aware  that if the procedure fails, amputation would be expected.  The patient also understands that even with successful revascularization, amputation may still be required due to the severity of the situation.  Risks and benefits are discussed and informed consent is obtained.   Procedure:  The patient was identified and appropriate procedural time out was performed.  The patient was then placed supine on the table and prepped and draped in the usual sterile fashion. Moderate conscious sedation was administered during a face to face encounter with the patient throughout the procedure with my supervision of the RN administering medicines and monitoring the patient's vital signs, pulse oximetry, telemetry and mental status throughout from the start of the procedure until the patient was taken to the recovery room. Ultrasound was used to evaluate the left common femoral artery.  It was patent .  A digital ultrasound image was acquired.  A Seldinger needle was used to access the left common femoral artery under direct ultrasound guidance and a permanent image was performed.  A 0.035 J wire was advanced without resistance and a 5Fr sheath was placed.  Pigtail catheter was placed into the aorta and an AP aortogram was performed. This demonstrated normal renal arteries  and normal aorta and iliac segments without significant stenosis. I then crossed the aortic bifurcation and advanced to the right femoral head. Selective right lower extremity angiogram was then performed. This demonstrated fairly normal common femoral artery, profunda femoris artery, and superficial femoral arteries.  The popliteal artery was fairly normal proximally, but the distal popliteal artery had significant disease that spilled into both the origins of the anterior tibial artery and the tibioperoneal trunk.  Between the distal popliteal artery and the proximal portions of these vessels, there were greater than 70% stenosis.  The posterior tibial and  peroneal arteries were small and diseased and did not fill the foot particularly well, but there were large collaterals that came off of these vessels to help.  The anterior tibial artery after the proximal stenosis normalized for about 8 to 10 cm and then occluded in the proximal to mid segment and the areas of previous interventions with stents that were thrombosed.  There was reconstitution of the mid to distal anterior tibial artery and this was the only flow that was good into the foot. It was felt that it was in the patient's best interest to proceed with intervention after these images to avoid a second procedure and a larger amount of contrast and fluoroscopy based off of the findings from the initial angiogram. The patient was systemically heparinized and a 6 French 70 cm Ansell sheath was then placed over the [employer] wire. I then used a Kumpe catheter and the advantage wire to navigate first into the anterior tibial artery and cross the occlusion without difficulty exchanging for a 0.014 advantage wire.  I then brought the penumbra 6 bolt mechanical thrombectomy device onto the field and over the 0.014 advantage wire, we were able to perform mechanical thrombectomy of the right SFA.  This helped debulk the thrombus, but there remained significant disease with occlusion.  I ballooned these areas first with a 2-1/2 and then a 3 mm diameter angioplasty balloon but there remained greater than 50% residual stenosis throughout the areas in the proximal to mid anterior tibial artery.  I elected to stent these areas.  A 3 mm diameter by 38 mm length Esprit stent was placed more distally and then a 3.5 mm diameter by 38 mm length Esprit stent was placed more proximally.  These were both postdilated with 3.5 mm balloons with excellent result and less than 20% residual stenosis.  The proximal anterior tibial artery was addressed with a 4 mm diameter by 40 cm length Lutonix drug-coated angioplasty balloon  inflated to 10 atm for 1 minute.  Completion imaging showed a 30 to 40% residual stenosis in this location that was suboptimal for stent placement as it would potentially jail off the large tibioperoneal trunk and collaterals downstream from this.  The anterior tibial artery was now also in line to the foot.  I then turned my attention to the distal popliteal artery and tibioperoneal trunk lesion after redirecting the wire down to the posterior tibial artery.  This was an extremely large vessel.  A 5 mm diameter by 6 cm length Lutonix drug-coated angioplasty balloon was inflated in the distal popliteal artery and tibioperoneal trunk.  This was inflated to 12 atm for 1 minute.  The distal popliteal artery was markedly improved with less than 20% residual stenosis but there remained a narrowing of greater than 50% in the tibioperoneal trunk and a 6 mm diameter by 2.5 cm length Viabahn stent was selected and deployed in the tibioperoneal  trunk and postdilated with a 5 mm diameter balloon with excellent angiographic completion result and less than 10% residual stenosis. I elected to terminate the procedure. The sheath was removed and StarClose closure device was deployed in the left femoral artery with excellent hemostatic result. The patient was taken to the recovery room in stable condition having tolerated the procedure well.  Findings:               Aortogram:  This demonstrated normal renal arteries and normal aorta and iliac segments without significant stenosis.              Right lower Extremity:  This demonstrated fairly normal common femoral artery, profunda femoris artery, and superficial femoral arteries.  The popliteal artery was fairly normal proximally, but the distal popliteal artery had significant disease that spilled into both the origins of the anterior tibial artery and the tibioperoneal trunk.  Between the distal popliteal artery and the proximal portions of these vessels, there were greater  than 70% stenosis.  The posterior tibial and peroneal arteries were small and diseased and did not fill the foot particularly well, but there were large collaterals that came off of these vessels to help.  The anterior tibial artery after the proximal stenosis normalized for about 8 to 10 cm and then occluded in the proximal to mid segment and the areas of previous interventions with stents that were thrombosed.  There was reconstitution of the mid to distal anterior tibial artery and this was the only flow that was good into the foot.   Disposition: Patient was taken to the recovery room in stable condition having tolerated the procedure well.  Complications: None  Selinda Gu 11/05/2023 11:28 AM   This note was created with Dragon Medical transcription system. Any errors in dictation are purely unintentional.

## 2023-11-05 NOTE — Consult Note (Signed)
 NAME: [name]  DOB: 1950-05-29  MRN: [id number]  Date/Time: 11/05/2023 1:00 PM  REQUESTING PROVIDER: Dr. Dorinda Subjective:  REASON FOR CONSULT: Right foot osteomyelitis ? Andrew Harding is a 73 y.o. 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 Ashley..  Culture then was MRSA.  Patient has been getting Bactrim  since that time. Patient at that time also underwent angio and had angioplasty and stent placement to the right anterior tibial artery, percutaneous transluminal angioplasty of the right mid popliteal artery and the right tibioperoneal trunk. Patient does not have any fever or chills  Vitals in the ED BP of 110/66, temperature 98.3, pulse 73, respiratory rate 20 and sats of 100% WBC 7.6, Hb 12.5, platelet 285 and creatinine 1.76 Broad-spectrum antibiotic was started. Patient was seen by the podiatrist and taken for TMA on 11/02/2023.  Patient is on broad-spectrum antibiotics and I am asked to see the patient for antibiotic management  Patient today went for angio and had mechanical thrombectomy of the right anterior tibial artery, angioplasty of the right anterior tibial artery with 2 stent placement to the right anterior tibial artery.  Stent was placed in the right tibioperoneal trunk.  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) 09/03/2023   Atrial fibrillation with RVR (HCC) 08/19/2022   Bladder neck obstruction    Cellulitis 08/17/2022   Chronic kidney disease    Coronary artery disease    a.) s/p 4v CABG in 2014   Diabetes mellitus without complication (HCC)    Diabetic neuropathy (HCC)    Diabetic peripheral neuropathy (HCC)    Diabetic ulcer of toe of left foot associated with diabetes mellitus of other type, limited to breakdown of skin (HCC) 12/21/2017   Diverticulosis    Gout    Gram-negative bacteremia 05/11/2023   Heart murmur    History of osteomyelitis 05/31/2015   Hypercholesteremia    Hyperlipidemia    Hypertension    Hypotension due to hypovolemia 08/17/2022   Infection of left foot 08/19/2022   MSSA bacteremia 02/28/2023   Open wound of left foot with complication 05/10/2023   Osteomyelitis (HCC) 05/31/2015   Peripheral neuropathy    Postural dizziness with presyncope 02/26/2023   S/P BKA (below knee amputation) unilateral, 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   S/P transmetatarsal amputation of foot, left (HCC) 08/29/2022   Sepsis (HCC) 08/17/2022   Sepsis (HCC) 05/10/2023   Status post amputation of toe of right foot (HCC) 11/01/2015   Tubular adenoma    Vitamin D  deficiency     Past Surgical History:  Procedure Laterality Date   ACHILLES TENDON SURGERY Right 09/14/2023   Procedure: TENOTOMY, ACHILLES;  Surgeon: [name], DPM;  Location: [facility];  Service: Orthopedics/Podiatry;  Laterality: Right;   AMPUTATION Left 08/19/2022   Procedure: TRANSMETATARSAL AMPUTATION LEFT FOOT  WITH IRRIGATION AND DEBRIDEMENT;  Surgeon: Lennie Barter, DPM;  Location: [facility];  Service: Podiatry;  Laterality: Left;   AMPUTATION Left 05/16/2023   Procedure: AMPUTATION BELOW KNEE;  Surgeon: Marea Selinda RAMAN, MD;  Location: [facility];  Service: General;  Laterality: Left;   AMPUTATION Right 09/14/2023   Procedure: AMPUTATION, FOOT, RAY;  Surgeon: [name], DPM;  Location:  [facility];  Service: Orthopedics/Podiatry;  Laterality: Right;   AMPUTATION TOE Right 06/01/2015   Procedure: AMPUTATION TOE;  Surgeon: Donnice Cory, DPM;  Location: [facility];  Service: Podiatry;  Laterality: Right;   AMPUTATION TOE Left 08/11/2022   Procedure: AMPUTATION TOE 2, 3, 4;  Surgeon: [name], DPM;  Location: [facility];  Service: Podiatry;  Laterality: Left;   CATARACT EXTRACTION, BILATERAL     CIRCUMCISION N/A 06/12/2022   Procedure: CIRCUMCISION ADULT;  Surgeon: Penne Knee, MD;  Location: [facility];  Service: Urology;  Laterality: N/A;   COLONOSCOPY WITH PROPOFOL  N/A 02/14/2016   Procedure: COLONOSCOPY WITH PROPOFOL ;  Surgeon: Gladis RAYMOND Mariner, MD;  Location: [facility];  Service: Endoscopy;  Laterality: N/A;   COLONOSCOPY WITH PROPOFOL  N/A 01/07/2019   Procedure: COLONOSCOPY WITH PROPOFOL ;  Surgeon: Mariner Gladis RAYMOND, MD;  Location: [facility]OSCOPY;  Service: Endoscopy;  Laterality: N/A;   CORONARY ARTERY BYPASS GRAFT N/A 08/2012   EXCISION PARTIAL PHALANX Right 06/01/2015   Procedure: EXCISION PARTIAL PHALANX /  BONE;  Surgeon: Donnice Cory, DPM;  Location: [facility];  Service: Podiatry;  Laterality: Right;   FLEXIBLE SIGMOIDOSCOPY N/A 05/29/2016   Procedure: FLEXIBLE SIGMOIDOSCOPY;  Surgeon: Gladis RAYMOND Mariner, MD;  Location: [facility];  Service: Endoscopy;  Laterality: N/A;   INCISION AND DRAINAGE Left 08/23/2022   Procedure: INCISION AND DRAINAGE;  Surgeon: [name], DPM;  Location: [facility];  Service: Podiatry;  Laterality: Left;   INCISION AND DRAINAGE OF WOUND Left 09/15/2022   Procedure: 11044 - DEBRIDE BONE and EXCISION IF 1ST METATARSAL BONE WITH  DELAY PRIMARY CLOSURE;  Surgeon: [name], DPM;  Location: [facility];  Service: Podiatry;  Laterality: Left;   IRRIGATION AND DEBRIDEMENT FOOT Left 02/28/2023   Procedure: IRRIGATION AND DEBRIDEMENT FOOT;  Surgeon: [name], DPM;  Location: [facility];  Service: Orthopedics/Podiatry;   Laterality: Left;   KNEE ARTHROSCOPY Left    LOWER EXTREMITY ANGIOGRAPHY Left 08/22/2022   Procedure: Lower Extremity Angiography;  Surgeon: Jama Cordella MATSU, MD;  Location: [facility];  Service: Cardiovascular;  Laterality: Left;   LOWER EXTREMITY ANGIOGRAPHY Left 05/11/2023   Procedure: Lower Extremity Angiography;  Surgeon: Marea Selinda RAMAN, MD;  Location: [facility];  Service: Cardiovascular;  Laterality: Left;   LOWER EXTREMITY ANGIOGRAPHY Right 09/11/2023   Procedure: Lower Extremity Angiography;  Surgeon: Jama Cordella MATSU, MD;  Location: [facility];  Service: Cardiovascular;  Laterality: Right;   LOWER EXTREMITY INTERVENTION Right 09/11/2023   Procedure: LOWER EXTREMITY INTERVENTION;  Surgeon: Jama Cordella MATSU, MD;  Location: [facility];  Service: Cardiovascular;  Laterality: Right;   TEE WITHOUT CARDIOVERSION N/A 03/01/2023   Procedure: TRANSESOPHAGEAL ECHOCARDIOGRAM;  Surgeon: Alluri, Keller BROCKS, MD;  Location: [facility];  Service: Cardiovascular;  Laterality: N/A;   TRANSMETATARSAL AMPUTATION Right 11/02/2023   Procedure: AMPUTATION, FOOT, TRANSMETATARSAL;  Surgeon: [name], DPM;  Location: [facility];  Service: Orthopedics/Podiatry;  Laterality: Right;   WOUND DEBRIDEMENT Left 09/15/2022   Procedure: 11043 - DEBRIDE SKIN. MUSCLE FASCIA;  Surgeon: [name], DPM;  Location: [facility];  Service: Podiatry;  Laterality: Left;  Social History   Socioeconomic History   Marital status: Divorced    Spouse name: Ashley,Angela C   Number of children: Not on file   Years of education: Not on file   Highest education level: Not on file  Occupational History   Not on file  Tobacco Use   Smoking status: Former    Types: Cigars   Smokeless tobacco: Never  Vaping Use   Vaping status: Never Used  Substance and Sexual Activity   Alcohol use: No   Drug use: No   Sexual activity: Never  Other Topics Concern   Not on file  Social History  Narrative   Patient is legally separated and lives at home by himself. His ex -wife is his HCPOA. Neighbors and friends are his support system. He used to work for [employer]    Social Drivers of Corporate investment banker Strain: Low Risk  (12/13/2022)   Received from [facility]   Overall Financial Resource Strain (CARDIA)    Difficulty of Paying Living Expenses: Not hard at all  Food Insecurity: No Food Insecurity (11/01/2023)   Hunger Vital Sign    Worried About Running Out of Food in the Last Year: Never true    Ran Out of Food in the Last Year: Never true  Transportation Needs: No Transportation Needs (11/01/2023)   PRAPARE - Administrator, Civil Service (Medical): No    Lack of Transportation (Non-Medical): No  Physical Activity: Not on file  Stress: Not on file  Social Connections: Moderately Isolated (11/01/2023)   Social Connection and Isolation Panel    Frequency of Communication with Friends and Family: More than three times a week    Frequency of Social Gatherings with Friends and Family: Twice a week    Attends Religious Services: More than 4 times per year    Active Member of [employer] or Organizations: No    Attends Banker Meetings: Never    Marital Status: Divorced  Catering manager Violence: Not At Risk (11/01/2023)   Humiliation, Afraid, Rape, and Kick questionnaire    Fear of Current or Ex-Partner: No    Emotionally Abused: No    Physically Abused: No    Sexually Abused: No    Family History  Problem Relation Age of Onset   Diabetes Mellitus II Mother    CAD Mother    No Known Allergies I? Current Facility-Administered Medications  Medication Dose Route Frequency Provider Last Rate Last Admin   acetaminophen  (TYLENOL ) tablet 325-650 mg  325-650 mg Oral Q4H PRN Dew, Jason S, MD       apixaban  (ELIQUIS ) tablet 5 mg  5 mg Oral BID Dew, Jason S, MD       ceFEPIme  (MAXIPIME ) 2 g in sodium chloride  0.9 %  100 mL IVPB  2 g Intravenous Q8H Dew, Jason S, MD 200 mL/hr at 11/05/23 0410 2 g at 11/05/23 0410   clopidogrel  (PLAVIX ) tablet 75 mg  75 mg Oral Daily Dew, Jason S, MD       DAPTOmycin  (CUBICIN ) IVPB 700 mg/100mL premix  8 mg/kg Intravenous Q1400 Dew, Jason S, MD 200 mL/hr at 11/04/23 1336 700 mg at 11/04/23 1336   docusate sodium  (COLACE) capsule 100 mg  100 mg Oral BID PRN Dew, Jason S, MD       enoxaparin  (LOVENOX ) injection 80 mg  80 mg Subcutaneous Q12H Madueme, Elvira C, RPH       gabapentin  (NEURONTIN )  capsule 300 mg  300 mg Oral TID Dew, Jason S, MD   300 mg at 11/04/23 2105   insulin  aspart (novoLOG ) injection 0-15 Units  0-15 Units Subcutaneous TID WC Dew, Jason S, MD   2 Units at 11/04/23 1710   insulin  aspart (novoLOG ) injection 0-5 Units  0-5 Units Subcutaneous QHS Dew, Jason S, MD       insulin  glargine-yfgn (SEMGLEE ) injection 28 Units  28 Units Subcutaneous QHS Dew, Jason S, MD   28 Units at 11/04/23 2106   metoprolol  tartrate (LOPRESSOR ) tablet 25 mg  25 mg Oral BID Dew, Jason S, MD   25 mg at 11/04/23 2104   oxyCODONE  (Oxy IR/ROXICODONE ) immediate release tablet 5 mg  5 mg Oral Q6H PRN Dew, Jason S, MD       QUEtiapine  (SEROQUEL ) tablet 25 mg  25 mg Oral BID BM & HS PRN Dew, Jason S, MD   25 mg at 11/04/23 2105     Abtx:  Anti-infectives (From admission, onward)    Start     Dose/Rate Route Frequency Ordered Stop   11/05/23 1030  ceFAZolin  (ANCEF ) IVPB 2g/100 mL premix        2 g 200 mL/hr over 30 Minutes Intravenous  Once 11/05/23 0938 11/05/23 1014   11/03/23 1200  ceFEPIme  (MAXIPIME ) 2 g in sodium chloride  0.9 % 100 mL IVPB        2 g 200 mL/hr over 30 Minutes Intravenous Every 8 hours 11/03/23 0934     11/02/23 1800  vancomycin  (VANCOREADY) IVPB 1250 mg/250 mL  Status:  Discontinued        1,250 mg 166.7 mL/hr over 90 Minutes Intravenous Every 24 hours 11/01/23 1730 11/02/23 1352   11/02/23 1800  vancomycin  (VANCOREADY) IVPB 1500 mg/300 mL  Status:  Discontinued         1,500 mg 150 mL/hr over 120 Minutes Intravenous Every 24 hours 11/02/23 1352 11/02/23 1626   11/02/23 1715  DAPTOmycin  (CUBICIN ) IVPB 700 mg/151mL premix        8 mg/kg  82.4 kg 200 mL/hr over 30 Minutes Intravenous Daily 11/02/23 1626     11/02/23 1600  ceFEPIme  (MAXIPIME ) 2 g in sodium chloride  0.9 % 100 mL IVPB  Status:  Discontinued        2 g 200 mL/hr over 30 Minutes Intravenous Every 12 hours 11/02/23 1355 11/03/23 0934   11/01/23 1800  ceFEPIme  (MAXIPIME ) 2 g in sodium chloride  0.9 % 100 mL IVPB  Status:  Discontinued        2 g 200 mL/hr over 30 Minutes Intravenous Every 24 hours 11/01/23 1726 11/02/23 1355   11/01/23 1745  vancomycin  (VANCOCIN ) IVPB 1000 mg/200 mL premix        1,000 mg 200 mL/hr over 60 Minutes Intravenous  Once 11/01/23 1730 11/01/23 2256   11/01/23 1330  Ampicillin -Sulbactam (UNASYN ) 3 g in sodium chloride  0.9 % 100 mL IVPB       Placed in And Linked Group   3 g 200 mL/hr over 30 Minutes Intravenous  Once 11/01/23 1327 11/01/23 1506   11/01/23 1330  vancomycin  (VANCOCIN ) IVPB 1000 mg/200 mL premix       Placed in And Linked Group   1,000 mg 200 mL/hr over 60 Minutes Intravenous  Once 11/01/23 1327 11/01/23 1611       REVIEW OF SYSTEMS:  Const: negative fever, negative chills, negative weight loss Eyes: negative diplopia or visual changes, negative eye pain ENT: negative coryza, negative  sore throat Resp: negative cough, hemoptysis, dyspnea Cards: negative for chest pain, palpitations, lower extremity edema GU: negative for frequency, dysuria and hematuria GI: Negative for abdominal pain, diarrhea, bleeding, constipation Skin: negative for rash and pruritus Heme: negative for easy bruising and gum/nose bleeding MS: As above Neurolo:negative for headaches, dizziness, vertigo, memory problems  Psych: negative for feelings of anxiety, depression  Endocrine: diabetes Allergy/Immunology- negative for any medication or food  allergies ?  Objective:  VITALS:  BP 107/66 (BP Location: Left Arm)   Pulse 90   Temp 97.8 F (36.6 C) (Oral)   Resp 16   Ht 6' 3 (1.905 m)   Wt 82.4 kg   SpO2 100%   BMI 22.71 kg/m   PHYSICAL EXAM:  General: Alert, cooperative, no distress, appears stated age.  Head: Normocephalic, without obvious abnormality, atraumatic. Eyes: Conjunctivae clear, anicteric sclerae. Pupils are equal ENT Nares normal. No drainage or sinus tenderness. Lips, mucosa, and tongue normal. No Thrush Neck: Supple, symmetrical, no adenopathy, thyroid: non tender no carotid bruit and no JVD. Back: No CVA tenderness. Lungs: Clear to auscultation bilaterally. No Wheezing or Rhonchi. No rales. Heart: Regular rate and rhythm, no murmur, rub or gallop. Abdomen: Soft, non-tender,not distended. Bowel sounds normal. No masses Extremities: Left foot has TMA TMA site has some bleeding Wound well-approximated     Skin: No rashes or lesions. Or bruising Lymph: Cervical, supraclavicular normal. Neurologic: Grossly non-focal Pertinent Labs Lab Results CBC    Component Value Date/Time   WBC 8.1 11/05/2023 0430   RBC 3.31 (L) 11/05/2023 0430   HGB 9.9 (L) 11/05/2023 0430   HCT 30.2 (L) 11/05/2023 0430   PLT 236 11/05/2023 0430   MCV 91.2 11/05/2023 0430   MCH 29.9 11/05/2023 0430   MCHC 32.8 11/05/2023 0430   RDW 14.6 11/05/2023 0430   LYMPHSABS 1.0 11/05/2023 0430   MONOABS 0.6 11/05/2023 0430   EOSABS 0.2 11/05/2023 0430   BASOSABS 0.0 11/05/2023 0430       Latest Ref Rng & Units 11/05/2023    4:30 AM 11/04/2023    4:41 AM 11/03/2023    5:23 AM  CMP  Glucose 70 - 99 mg/dL 855  855  822   BUN 8 - 23 mg/dL 20  17  17    Creatinine 0.61 - 1.24 mg/dL 8.76  8.79  8.77   Sodium 135 - 145 mmol/L 136  136  137   Potassium 3.5 - 5.1 mmol/L 4.1  4.2  4.4   Chloride 98 - 111 mmol/L 105  106  108   CO2 22 - 32 mmol/L 24  23  21    Calcium 8.9 - 10.3 mg/dL 8.6  8.5  8.8       Microbiology: Recent  Results (from the past 240 hours)  Aerobic/Anaerobic Culture w Gram Stain (surgical/deep wound)     Status: None (Preliminary result)   Collection Time: 11/02/23 12:07 PM   Specimen: Foot, Right; Wound  Result Value Ref Range Status   Specimen Description TISSUE BONE  Final   Special Requests RIGHT FOOT AMPUTATION RT 1ST METATARSAL  Final   Gram Stain   Final    RARE WBC PRESENT, PREDOMINANTLY PMN RARE GRAM POSITIVE COCCI Performed at [facility], [address] [place], [place] [address]    Culture   Final    FEW STAPHYLOCOCCUS AUREUS CULTURE REINCUBATED FOR BETTER GROWTH NO ANAEROBES ISOLATED; CULTURE IN PROGRESS FOR 5 DAYS    Report Status PENDING  Incomplete  Aerobic/Anaerobic Culture w Gram Stain (surgical/deep wound)     Status: None (Preliminary result)   Collection Time: 11/02/23 12:09 PM   Specimen: Foot, Right; Wound  Result Value Ref Range Status   Specimen Description   Final    WOUND Performed at [facility] [place] [facility], [address] [place], [place] [address]    Special Requests   Final    RIGHT FOOT AMPUTATION SWAB OF RT 1ST METATARSAL UCLER   Gram Stain   Final    RARE WBC PRESENT, PREDOMINANTLY PMN FEW GRAM POSITIVE COCCI    Culture   Final    FEW STAPHYLOCOCCUS AUREUS FEW ENTEROCOCCUS FAECALIS NO ANAEROBES ISOLATED; CULTURE IN PROGRESS FOR 5 DAYS SUSCEPTIBILITIES TO FOLLOW Performed at [facility], [address] [place], [place] [address]    Report Status PENDING  Incomplete    IMAGING RESULTS: I have personally reviewed the films ? Impression/Recommendation Diabetic foot infection with peripheral artery disease involving the right foot at the site of prior amputation of the great toe Underwent TMA on 11/02/2023 Currently on broad-spectrum antibiotic vancomycin  and cefepime  Culture pending Bone pathology pending Depending on the above results we will decide whether he needs IV antibiotic versus oral antibiotic  History of left  BKA Site is healthy  CAD Status post CABG  A-fib on  Eliquis    ________________________________________________ Discussed with patient, requesting provider Note:  This document was prepared using Dragon voice recognition software and may include unintentional dictation errors.

## 2023-11-05 NOTE — Progress Notes (Signed)
 Physical Therapy Treatment Patient Details Name: Andrew Harding MRN: [id number] DOB: 01-Sep-1950 Today's Date: 11/05/2023   History of Present Illness NEHAL SHIVES is a 73 y.o. 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 Ashley.    PT Comments  Pt was pleasant and motivated to participate during the session and put forth good effort throughout. Pt was able to tolerate therex described below. Pt required VC's for proper gait sequencing when transferring from EOB to chair, which needed to be repeated throughout, due to pt being impulsive and demonstrating improper gait sequence. PT discussed with pt about the benefits of going to a SNF upon d/c to maximize RLE weight bearing compliance and healing. Pt stated that he was willing to go to SNF upon d/c given location has a good reputation. Pt's care team was made aware of change of discharge recommendation to SNF. Pt's surgical site from today's procedure was monitored throughout today's visit and no concerns noted. Pt reported no adverse symptoms during the session with SpO2 and HR WNL throughout on room air. Pt will benefit from continued PT services upon discharge to safely address deficits listed in patient problem list for decreased caregiver assistance and eventual return to PLOF.       If plan is discharge home, recommend the following: Help with stairs or ramp for entrance   Can travel by private vehicle        Equipment Recommendations  Other (comment) (TBD at next venue of care)    Recommendations for Other Services       Precautions / Restrictions Precautions Precautions: Fall Recall of Precautions/Restrictions: Intact Restrictions Weight Bearing Restrictions Per Provider Order:  Yes RLE Weight Bearing Per Provider Order: Non weight bearing Other Position/Activity Restrictions: heel weight bearing transfers only     Mobility  Bed Mobility Overal bed mobility: Independent             General bed mobility comments: Pt independent with coming to sitting EOB from supine without use of bedrails or physical assistance    Transfers Overall transfer level: Needs assistance Equipment used: Rolling walker (2 wheels) Transfers: Sit to/from Stand Sit to Stand: Contact guard assist           General transfer comment: VC's were utilized on foot placement to maintain heel WB precaution on RLE throughout STS    Ambulation/Gait Ambulation/Gait assistance: Contact guard assist Gait Distance (Feet): 3 Feet Assistive device: Rolling walker (2 wheels) Gait Pattern/deviations: Step-to pattern Gait velocity: decr     General Gait Details: VC's were utilized for proper gait sequencing forwards, backwards, and turning while maintaining heel WB precaution on RLE. VC's needed to be repeated throughout transfer from EOB to chair. Pt limited to functional transfers only   Stairs             Wheelchair Mobility     Tilt Bed    Modified Rankin (Stroke Patients Only)       Balance Overall balance assessment: Needs assistance Sitting-balance support: Feet supported, No upper extremity supported Sitting balance-Leahy Scale: Normal     Standing balance support: Bilateral upper extremity supported, During functional activity, Reliant on assistive device for balance Standing balance-Leahy Scale: Good Standing balance comment: Pt able to maintain balance in stance and ambulation, remained  steady throughout and no LOBs occured                            Communication Communication Communication: No apparent difficulties  Cognition Arousal: Alert Behavior During Therapy: Impulsive   PT - Cognitive impairments: No apparent impairments                          Following commands: Intact      Cueing Cueing Techniques: Verbal cues, Gestural cues  Exercises Other Exercises Other Exercises: Quad sets x 11 BLE Other Exercises: SLR with resistance x 11 RLE Other Exercises: Hip abduction with resistance x 11 BLE Other Exercises: Knee flexion/extension with resistance x 11 RLE Other Exercises: Hip flexion with resistance x 11 RLE Other exercises: Bolster bridges x 11 Knee flexion/extension without resistance x 11 LLE Glute sets x 11   General Comments  Pt educated on WB compliance for proper wound healing      Pertinent Vitals/Pain Pain Assessment Pain Assessment: No/denies pain    Home Living                          Prior Function            PT Goals (current goals can now be found in the care plan section) Progress towards PT goals: Progressing toward goals    Frequency    Min 2X/week      PT Plan      Co-evaluation              AM-PAC PT 6 Clicks Mobility   Outcome Measure  Help needed turning from your back to your side while in a flat bed without using bedrails?: None Help needed moving from lying on your back to sitting on the side of a flat bed without using bedrails?: None Help needed moving to and from a bed to a chair (including a wheelchair)?: A Little Help needed standing up from a chair using your arms (e.g., wheelchair or bedside chair)?: A Little Help needed to walk in hospital room?: A Little Help needed climbing 3-5 steps with a railing? : A Lot 6 Click Score: 19    End of Session Equipment Utilized During Treatment: Gait belt Activity Tolerance: Patient tolerated treatment well Patient left: in chair;with call bell/phone within reach;with chair alarm set Nurse Communication: Mobility status PT Visit Diagnosis: Unsteadiness on feet (R26.81);Other abnormalities of gait and mobility (R26.89)     Time: 8550-8468 PT Time Calculation (min) (ACUTE ONLY): 42  min  Charges:                            Leontine Ingles, SPT 11/05/23, 4:09 PM

## 2023-11-05 NOTE — TOC Progression Note (Signed)
 Transition of Care [facility]) - Progression Note    Patient Details  Name: Andrew Harding MRN: [id number] Date of Birth: 1950-11-24  Transition of Care [facility]) CM/SW Contact  [place] L Imran [place], [place] Phone Number: 11/05/2023, 1:00 PM  Clinical Narrative:     Patient is expected to have vascular surgery today. TOC will continue to follow-up regarding needs.        Expected Discharge Plan and Services                                               Social Determinants of Health (SDOH) Interventions SDOH Screenings   Food Insecurity: No Food Insecurity (11/01/2023)  Housing: Low Risk  (11/01/2023)  Transportation Needs: No Transportation Needs (11/01/2023)  Utilities: Not At Risk (11/01/2023)  Depression (PHQ2-9): Low Risk  (05/03/2023)  Financial Resource Strain: Low Risk  (12/13/2022)   Received from [facility]  Social Connections: Moderately Isolated (11/01/2023)  Tobacco Use: Medium Risk (11/01/2023)    Readmission Risk Interventions    02/28/2023   12:53 PM  Readmission Risk Prevention Plan  Transportation Screening Complete  PCP or Specialist Appt within 3-5 Days Complete  HRI or Home Care Consult Complete  Social Work Consult for Recovery Care Planning/Counseling Complete  Palliative Care Screening Not Applicable  Medication Review Oceanographer) Complete

## 2023-11-05 NOTE — Plan of Care (Signed)
  Problem: Nutritional: Goal: Maintenance of adequate nutrition will improve Outcome: Progressing   Problem: Nutrition: Goal: Adequate nutrition will be maintained Outcome: Progressing   Problem: Coping: Goal: Level of anxiety will decrease Outcome: Progressing   Problem: Safety: Goal: Ability to remain free from injury will improve Outcome: Progressing

## 2023-11-05 NOTE — Progress Notes (Signed)
 Progress Note   [name] FMW:[id number] DOB: 28-Mar-1951 DOA: 11/01/2023     4 DOS: the patient was seen and examined on 11/05/2023     Brief hospital course: From HPI Andrew Harding is a 73 y.o. 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 Ashley.  Patient reports compliance with his daily dressing changes.  He denies any improvement in his drainage or appearance of the foot since being on outpatient antibiotics.  Pain is adequately controlled. Denies any other acute issues.       Assessment and Plan:    Ulcer of right foot with bone involvement  s/p right first to amputation with poor wound healing  Patient underwent surgery by podiatry on 11/02/2023 - Continue to hold eliquis  and plavix  Advance diet after surgery - optimize glucose control  Continue vancomycin  and cefepime  Vascular surgery planning angiography today Continue PT OT   CAD s/p CABG PAF on eliquis   - hold eliquis  and plavix  until cleared by vascular surgery Continue metoprolol  and statin   IDT2DM Continue monitoring glucose Continue insulin  therapy    Advance Care Planning:   Code Status: Full Code discussed with patient at time of admission   Consults: Podiatry   Subjective:  Patient seen and examined at bedside this morning Patient awaiting angiogram by vascular surgery today Patient denies worsening foot pain nausea vomiting abdominal pain   Physical Exam:     General: He is not in acute distress.    Appearance: He is not ill-appearing or toxic-appearing.  HENT:     Head: Normocephalic and atraumatic.     Mouth/Throat:     Mouth: Mucous membranes are moist.  Eyes:     Extraocular Movements: Extraocular  movements intact.     Pupils: Pupils are equal, round, and reactive to light.  Cardiovascular:     Rate and Rhythm: Normal rate and regular rhythm.  Pulmonary:     Effort: Pulmonary effort is normal. No respiratory distress.     Breath sounds: Normal breath sounds. No wheezing.  Abdominal:     General: Abdomen is flat. Bowel sounds are normal. There is no distension.     Palpations: Abdomen is soft.     Tenderness: There is no abdominal tenderness.  Musculoskeletal:     Cervical back: Normal range of motion and neck supple.     Right lower leg: No edema.     Comments: L BKA, prosthetic in place Right foot wrapped in immobilizer.  Foot in a clean and dry dressing  Skin:    Capillary Refill: Capillary refill takes less than 2 seconds.  Neurological:     General: No focal deficit present.     Mental Status: He is alert.  Psychiatric:        Mood and Affect: Mood normal.        Behavior: Behavior normal.    Disposition: Skilled nursing facility  Data Reviewed:   Vitals:   11/05/23 1115 11/05/23 1130 11/05/23 1145 11/05/23 1226  BP: 108/67 122/65 105/72 107/66  Pulse: 88 84 89 90  Resp: 15 15 15 16   Temp:   97.9 F (36.6 C) 97.8 F (36.6 C)  TempSrc:   Temporal Oral  SpO2: 96% 96% 96% 100%  Weight:      Height:          Latest Ref Rng & Units 11/05/2023    4:30 AM 11/04/2023    4:41 AM 11/03/2023    5:23 AM  CBC  WBC 4.0 - 10.5 K/uL 8.1  6.7  6.0   Hemoglobin 13.0 - 17.0 g/dL 9.9  9.7  9.9   Hematocrit 39.0 - 52.0 % 30.2  29.9  30.5   Platelets 150 - 400 K/uL 236  224  200        Latest Ref Rng & Units 11/05/2023    4:30 AM 11/04/2023    4:41 AM 11/03/2023    5:23 AM  BMP  Glucose 70 - 99 mg/dL 855  855  822   BUN 8 - 23 mg/dL 20  17  17    Creatinine 0.61 - 1.24 mg/dL 8.76  8.79  8.77   Sodium 135 - 145 mmol/L 136  136  137   Potassium 3.5 - 5.1 mmol/L 4.1  4.2  4.4   Chloride 98 - 111 mmol/L 105  106  108   CO2 22 - 32 mmol/L 24  23  21    Calcium 8.9 - 10.3 mg/dL  8.6  8.5  8.8    =  Author: Drue ONEIDA Potter, MD 11/05/2023 3:50 PM  For on call review [email or web address].

## 2023-11-05 NOTE — Plan of Care (Signed)
  Problem: Nutritional: Goal: Maintenance of adequate nutrition will improve Outcome: Progressing   Problem: Clinical Measurements: Goal: Will remain free from infection Outcome: Progressing   Problem: Activity: Goal: Risk for activity intolerance will decrease Outcome: Progressing   Problem: Coping: Goal: Level of anxiety will decrease Outcome: Progressing   Problem: Elimination: Goal: Will not experience complications related to urinary retention Outcome: Progressing   Problem: Pain Managment: Goal: General experience of comfort will improve and/or be controlled Outcome: Progressing

## 2023-11-06 ENCOUNTER — Encounter: Payer: Self-pay | Admitting: Vascular Surgery

## 2023-11-06 DIAGNOSIS — Z794 Long term (current) use of insulin: Secondary | ICD-10-CM

## 2023-11-06 DIAGNOSIS — E11628 Type 2 diabetes mellitus with other skin complications: Secondary | ICD-10-CM | POA: Diagnosis not present

## 2023-11-06 DIAGNOSIS — L97519 Non-pressure chronic ulcer of other part of right foot with unspecified severity: Secondary | ICD-10-CM | POA: Diagnosis not present

## 2023-11-06 DIAGNOSIS — B9562 Methicillin resistant Staphylococcus aureus infection as the cause of diseases classified elsewhere: Secondary | ICD-10-CM

## 2023-11-06 DIAGNOSIS — M86171 Other acute osteomyelitis, right ankle and foot: Secondary | ICD-10-CM | POA: Diagnosis not present

## 2023-11-06 DIAGNOSIS — L089 Local infection of the skin and subcutaneous tissue, unspecified: Secondary | ICD-10-CM | POA: Diagnosis not present

## 2023-11-06 DIAGNOSIS — Z89512 Acquired absence of left leg below knee: Secondary | ICD-10-CM | POA: Diagnosis not present

## 2023-11-06 DIAGNOSIS — I739 Peripheral vascular disease, unspecified: Secondary | ICD-10-CM | POA: Diagnosis not present

## 2023-11-06 DIAGNOSIS — B952 Enterococcus as the cause of diseases classified elsewhere: Secondary | ICD-10-CM

## 2023-11-06 LAB — BASIC METABOLIC PANEL WITH GFR
Anion gap: 5 (ref 5–15)
BUN: 19 mg/dL (ref 8–23)
CO2: 25 mmol/L (ref 22–32)
Calcium: 8.5 mg/dL — ABNORMAL LOW (ref 8.9–10.3)
Chloride: 107 mmol/L (ref 98–111)
Creatinine, Ser: 1.26 mg/dL — ABNORMAL HIGH (ref 0.61–1.24)
GFR, Estimated: 60 mL/min — ABNORMAL LOW (ref >60)
Glucose, Bld: 148 mg/dL — ABNORMAL HIGH (ref 70–99)
Potassium: 4.6 mmol/L (ref 3.5–5.1)
Sodium: 137 mmol/L (ref 135–145)

## 2023-11-06 LAB — SURGICAL PATHOLOGY

## 2023-11-06 LAB — CBC WITH DIFFERENTIAL/PLATELET
Abs Immature Granulocytes: 0.06 K/uL (ref 0.00–0.07)
Basophils Absolute: 0 K/uL (ref 0.0–0.1)
Basophils Relative: 0 %
Eosinophils Absolute: 0.1 K/uL (ref 0.0–0.5)
Eosinophils Relative: 1 %
HCT: 28.5 % — ABNORMAL LOW (ref 39.0–52.0)
Hemoglobin: 9 g/dL — ABNORMAL LOW (ref 13.0–17.0)
Immature Granulocytes: 1 %
Lymphocytes Relative: 10 %
Lymphs Abs: 0.9 K/uL (ref 0.7–4.0)
MCH: 28.4 pg (ref 26.0–34.0)
MCHC: 31.6 g/dL (ref 30.0–36.0)
MCV: 89.9 fL (ref 80.0–100.0)
Monocytes Absolute: 0.8 K/uL (ref 0.1–1.0)
Monocytes Relative: 9 %
Neutro Abs: 7.4 K/uL (ref 1.7–7.7)
Neutrophils Relative %: 79 %
Platelets: 225 K/uL (ref 150–400)
RBC: 3.17 MIL/uL — ABNORMAL LOW (ref 4.22–5.81)
RDW: 14.7 % (ref 11.5–15.5)
WBC: 9.2 K/uL (ref 4.0–10.5)
nRBC: 0 % (ref 0.0–0.2)

## 2023-11-06 LAB — GLUCOSE, CAPILLARY
Glucose-Capillary: 157 mg/dL — ABNORMAL HIGH (ref 70–99)
Glucose-Capillary: 170 mg/dL — ABNORMAL HIGH (ref 70–99)
Glucose-Capillary: 126 mg/dL — ABNORMAL HIGH (ref 70–99)
Glucose-Capillary: 163 mg/dL — ABNORMAL HIGH (ref 70–99)

## 2023-11-06 NOTE — Plan of Care (Signed)
  Problem: Coping: Goal: Ability to adjust to condition or change in health will improve Outcome: Progressing   Problem: Metabolic: Goal: Ability to maintain appropriate glucose levels will improve Outcome: Progressing   Problem: Nutritional: Goal: Maintenance of adequate nutrition will improve Outcome: Progressing   Problem: Activity: Goal: Risk for activity intolerance will decrease Outcome: Progressing   Problem: Pain Managment: Goal: General experience of comfort will improve and/or be controlled Outcome: Progressing

## 2023-11-06 NOTE — Progress Notes (Signed)
 Daily Progress Note   Subjective  - 1 Day Post-Op  Follow-up right foot transmetatarsal amputation.  Patient underwent angio with revascularization with successful intervention.  No complaints today.  Objective Vitals:   11/05/23 1933 11/06/23 0448 11/06/23 0751 11/06/23 1523  BP: 127/74 (!) 94/59 100/68 108/75  Pulse: 86 88 94 93  Resp: 18  16 17   Temp: 99 F (37.2 C) 98.1 F (36.7 C) 98.4 F (36.9 C) 99.8 F (37.7 C)  TempSrc:  Oral Oral   SpO2: 99% 99% 97% 99%  Weight:      Height:        Physical Exam: Dressing changes.  Continued perfusion of the flaps noted at this time.  1 small area on the most medial aspect of the incision with darkened discoloration.  Small blood blister on the dorsal skin flap in the central aspect.  No active purulent drainage.  No severe redness.  Culture with MRSA and Enterococcus on swab.  MRSA on bone.   Pathology: FINAL DIAGNOSIS        1. Foot, amputation, right forefoot, proximal margin marked with ink :       ULCER WITH ACUTE INFLAMMATION AND NECROSIS.       ACUTE OSTEOMYELITIS.       OSSEOUS MARGIN NEGATIVE FOR OSTEOMYELITIS.   Laboratory CBC    Component Value Date/Time   WBC 9.2 11/06/2023 0537   HGB 9.0 (L) 11/06/2023 0537   HCT 28.5 (L) 11/06/2023 0537   PLT 225 11/06/2023 0537    BMET    Component Value Date/Time   NA 137 11/06/2023 0537   K 4.6 11/06/2023 0537   CL 107 11/06/2023 0537   CO2 25 11/06/2023 0537   GLUCOSE 148 (H) 11/06/2023 0537   GLUCOSE 402 (H) 09/17/2012 1417   BUN 19 11/06/2023 0537   CREATININE 1.26 (H) 11/06/2023 0537   CALCIUM 8.5 (L) 11/06/2023 0537   GFRNONAA 60 (L) 11/06/2023 0537   GFRAA 58 (L) 06/02/2015 0656    Assessment/Planning: Peripheral vascular disease status post transmetatarsal amputation right foot MRSA infection right foot  Infectious disease has been following.  Appreciate their recommendations. Wound is stable at this point.  I would recommend 3 times a week  dressing changes with Betadine  along incision site and padded dressing.  Orders will be placed in discharge instructions. Weightbearing to heel only for transfers and minimal ambulation less than 10 feet. Can follow-up with podiatry in the outpatient clinic in 2 weeks.   [name] A  11/06/2023, 4:51 PM

## 2023-11-06 NOTE — Progress Notes (Signed)
 Date of Admission:  11/01/2023      ID: Andrew Harding is a 73 y.o. 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 Ashley..  Culture then was MRSA.  Patient has been getting Bactrim  since that time. Patient at that time also underwent angio and had angioplasty and stent placement to the right anterior tibial artery, percutaneous transluminal angioplasty of the right mid popliteal artery and the right tibioperoneal trunk. Patient does not have any fever or chills Expand All Collapse All  NAME: Andrew Harding  DOB: Sep 13, 1950  MRN: [id number]  Date/Time: 11/05/2023 1:00 PM   REQUESTING PROVIDER: Dr. Dorinda Subjective:  REASON FOR CONSULT: Right foot osteomyelitis ? Andrew Harding is a 73 y.o. 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 Ashley..  Culture  then was MRSA.  Patient has been getting Bactrim  since that time. Patient at that time also underwent angio and had angioplasty and stent placement to the right anterior tibial artery, percutaneous transluminal angioplasty of the right mid popliteal artery and the right tibioperoneal trunk. Patient does not have any fever or chills  Patient was seen by the podiatrist and taken for TMA on 11/02/2023.   Patient is on broad-spectrum antibiotics and I am asked to see the patient for antibiotic management   Patient today went for angio and had mechanical thrombectomy of the right anterior tibial artery, angioplasty of the right anterior tibial artery with 2 stent placement to the right anterior tibial artery.  Stent was placed in the right tibioperoneal trunk   Subjective: Doing better  Medications:   apixaban   5 mg Oral BID   clopidogrel   75 mg Oral Daily   gabapentin   300 mg Oral TID   insulin  aspart  0-15 Units Subcutaneous TID WC   insulin  aspart  0-5 Units Subcutaneous QHS   insulin  glargine-yfgn  28 Units Subcutaneous QHS   metoprolol  tartrate  25 mg Oral BID    Objective: Vital signs in last 24 hours: Patient Vitals for the past 24 hrs:  BP Temp Temp src Pulse Resp SpO2  11/06/23 0751 100/68 98.4 F (36.9 C) Oral 94 16 97 %  11/06/23 0448 (!) 94/59 98.1 F (36.7 C) Oral 88 -- 99 %  11/05/23 1933 127/74 99 F (37.2 C) -- 86 18 99 %  11/05/23 1605 125/67 98.2 F (36.8 C) Oral  88 16 99 %  11/05/23 1550 123/69 98.7 F (37.1 C) Oral (!) 103 16 100 %      PHYSICAL EXAM:  General: Alert, cooperative, no distress, appears stated age.  Head: Normocephalic, without obvious abnormality, atraumatic. Eyes: Conjunctivae clear, anicteric sclerae. Pupils are equal ENT Nares normal. No drainage or sinus tenderness. Lips, mucosa, and tongue normal. No Thrush Neck: Supple, symmetrical, no adenopathy, thyroid: non tender no carotid bruit and no JVD. Back: No CVA tenderness. Lungs: Clear  to auscultation bilaterally. No Wheezing or Rhonchi. No rales. Heart: Regular rate and rhythm, no murmur, rub or gallop. Abdomen: Soft, non-tender,not distended. Bowel sounds nort leg dressing atraumatic, no cyanosis. No edema. No clubbing Skin: No rashes or lesions. Or bruising Lymph: Cervical, supraclavicular normal. Neurologic: Grossly non-focal  Lab Results    Latest Ref Rng & Units 11/06/2023    5:37 AM 11/05/2023    4:30 AM 11/04/2023    4:41 AM  CBC  WBC 4.0 - 10.5 K/uL 9.2  8.1  6.7   Hemoglobin 13.0 - 17.0 g/dL 9.0  9.9  9.7   Hematocrit 39.0 - 52.0 % 28.5  30.2  29.9   Platelets 150 - 400 K/uL 225  236  224        Latest Ref Rng & Units 11/06/2023    5:37 AM 11/05/2023    4:30 AM 11/04/2023    4:41 AM  CMP  Glucose 70 - 99 mg/dL 851  855  855   BUN 8 - 23 mg/dL 19  20  17    Creatinine 0.61 - 1.24 mg/dL 8.73  8.76  8.79   Sodium 135 - 145 mmol/L 137  136  136   Potassium 3.5 - 5.1 mmol/L 4.6  4.1  4.2   Chloride 98 - 111 mmol/L 107  105  106   CO2 22 - 32 mmol/L 25  24  23    Calcium 8.9 - 10.3 mg/dL 8.5  8.6  8.5       Microbiology: MRSA and enterococcus in culture     Assessment/Plan: Diabetic foot infection with peripheral artery disease involving the right foot at the site of prior amputation of the great toe Underwent TMA on 11/02/2023 Currently on broad-spectrum antibiotic vancomycin  and cefepime  Culture MRSA/enterococcus- DC cefepime  Bone pathology pending Depending on the above results we will decide whether he needs IV antibiotic versus oral antibiotic   History of left BKA Site is healthy   CAD Status post CABG   A-fib on  Eliquis   Discussed the management with the patient

## 2023-11-06 NOTE — NC FL2 (Signed)
 [place]  MEDICAID FL2 LEVEL OF CARE FORM     IDENTIFICATION  Patient Name: Andrew Harding Birthdate: 1950-05-05 Sex: male Admission Date (Current Location): 11/01/2023  [place] and [place] Number:  Chiropodist and Address:  [facility], [address], [place], [place] [address]      Provider Number: [id number]  Attending Physician Name and Address:  Dorinda Drue DASEN, MD  Relative Name and Phone Number:  Jon Knee [phone]    Current Level of Care: Hospital Recommended Level of Care: Skilled Nursing Facility Prior Approval Number:    Date Approved/Denied:   PASRR Number: [id number]  Discharge Plan: SNF    Current Diagnoses: Patient Active Problem List   Diagnosis Date Noted   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) 11/01/2023   Osteomyelitis of right foot (HCC) 11/01/2023   Atherosclerosis of native arteries of the extremities with ulceration (HCC) 09/03/2023   Atherosclerosis of native arteries of extremity with intermittent claudication (HCC) 09/02/2023   S/P BKA (below knee amputation) unilateral, 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   Gram-negative bacteremia 05/11/2023   Sepsis (HCC) 05/10/2023   Open wound of left foot with complication 05/10/2023   MSSA bacteremia 02/28/2023   Controlled type 2 diabetes mellitus with neuropathy (HCC) 02/27/2023   Postural dizziness with presyncope 02/26/2023   Diabetic peripheral neuropathy (HCC)    Wound drainage 09/02/2022   Diabetic foot infection with possible osteomyelitis, left  (HCC) 08/29/2022   S/P transmetatarsal amputation of foot, left (HCC) 08/29/2022   Acute osteomyelitis of left ankle or foot (HCC) 08/24/2022   Medication management 08/24/2022   Diabetic infection of left foot (HCC) 08/24/2022   Infection of left foot 08/19/2022   Atrial fibrillation (HCC) 08/19/2022   Cellulitis 08/17/2022   Hypotension due to hypovolemia 08/17/2022   Gout 04/05/2018   Vitamin D  deficiency,  unspecified 04/05/2018   Diabetic ulcer of toe of left foot associated with type 2 diabetes mellitus, limited to breakdown of skin (HCC) 12/21/2017   Status post amputation of toe of right foot (HCC) 11/01/2015   CKD (chronic kidney disease) stage 3, GFR 30-59 ml/min (HCC) 10/08/2015   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   Osteomyelitis (HCC) 05/31/2015   Type 2 diabetes mellitus with stage 3b chronic kidney disease, with long-term current use of insulin  (HCC) 05/31/2015   History of osteomyelitis 05/31/2015   Benign essential hypertension 09/14/2014   Coronary artery disease 09/17/2012   Mixed hyperlipidemia 09/17/2012   CAD 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    Orientation RESPIRATION BLADDER Height & Weight     Self, Time, Situation  Normal Continent Weight: 181 lb 10.5 oz (82.4 kg) Height:  6' 3 (190.5 cm)  BEHAVIORAL SYMPTOMS/MOOD NEUROLOGICAL BOWEL NUTRITION STATUS      Continent Diet  AMBULATORY STATUS COMMUNICATION OF NEEDS Skin   Limited Assist Verbally Normal                       Personal Care Assistance Level of Assistance  Bathing, Feeding, Dressing Bathing Assistance: Independent Feeding assistance: Independent Dressing Assistance: Independent     Functional Limitations Info  Sight, Hearing, Speech          SPECIAL CARE FACTORS FREQUENCY  PT (By licensed PT), OT (By licensed OT)     PT Frequency: 5x OT Frequency: 5x            Contractures Contractures Info: Not present    Additional Factors Info  Code Status, Allergies  Code Status Info: FULL Allergies Info: NKA           Current Medications (11/06/2023):  This is the current hospital active medication list Current Facility-Administered Medications  Medication Dose Route Frequency Provider Last Rate Last Admin   acetaminophen  (TYLENOL ) tablet 325-650 mg  325-650 mg Oral Q4H PRN Dew, Jason S, MD       apixaban  (ELIQUIS ) tablet 5 mg  5 mg Oral BID Dew, Jason S, MD   5 mg at 11/06/23 9070    clopidogrel  (PLAVIX ) tablet 75 mg  75 mg Oral Daily Dew, Jason S, MD   75 mg at 11/06/23 0929   DAPTOmycin  (CUBICIN ) IVPB 700 mg/100mL premix  8 mg/kg Intravenous Q1400 Dew, Jason S, MD 200 mL/hr at 11/06/23 1513 700 mg at 11/06/23 1513   docusate sodium  (COLACE) capsule 100 mg  100 mg Oral BID PRN Dew, Jason S, MD       gabapentin  (NEURONTIN ) capsule 300 mg  300 mg Oral TID Dew, Jason S, MD   300 mg at 11/06/23 0929   insulin  aspart (novoLOG ) injection 0-15 Units  0-15 Units Subcutaneous TID WC Dew, Jason S, MD   3 Units at 11/06/23 1222   insulin  aspart (novoLOG ) injection 0-5 Units  0-5 Units Subcutaneous QHS Dew, Jason S, MD   2 Units at 11/05/23 2108   insulin  glargine-yfgn (SEMGLEE ) injection 28 Units  28 Units Subcutaneous QHS Dew, Jason S, MD   28 Units at 11/05/23 2108   metoprolol  tartrate (LOPRESSOR ) tablet 25 mg  25 mg Oral BID Dew, Jason S, MD   25 mg at 11/06/23 9070   oxyCODONE  (Oxy IR/ROXICODONE ) immediate release tablet 5 mg  5 mg Oral Q6H PRN Dew, Jason S, MD   5 mg at 11/05/23 1439   QUEtiapine  (SEROQUEL ) tablet 25 mg  25 mg Oral BID BM & HS PRN Dew, Jason S, MD   25 mg at 11/05/23 2115     Discharge Medications: Please see discharge summary for a list of discharge medications.  Relevant Imaging Results:  Relevant Lab Results:   Additional Information [id number]  [place] L Anela Bensman, LCSW

## 2023-11-06 NOTE — TOC Progression Note (Signed)
 Transition of Care [facility]) - Progression Note    Patient Details  Name: Andrew Harding MRN: [id number] Date of Birth: 07-16-1950  [facility] CM/SW Contact  [place] L [place], [place] Phone Number: 11/06/2023, 4:28 PM  Clinical Narrative:     CSW met with patient. Patient advised that he wants to go home with [facility]. He advised that he will only go to a facility if he gets an offer to [employer] and [employer].        Expected Discharge Plan and Services                                               Social Determinants of Health (SDOH) Interventions SDOH Screenings   Food Insecurity: No Food Insecurity (11/01/2023)  Housing: Low Risk  (11/01/2023)  Transportation Needs: No Transportation Needs (11/01/2023)  Utilities: Not At Risk (11/01/2023)  Depression (PHQ2-9): Low Risk  (05/03/2023)  Financial Resource Strain: Low Risk  (12/13/2022)   Received from [facility]  Social Connections: Moderately Isolated (11/01/2023)  Tobacco Use: Medium Risk (11/01/2023)    Readmission Risk Interventions    02/28/2023   12:53 PM  Readmission Risk Prevention Plan  Transportation Screening Complete  PCP or Specialist Appt within 3-5 Days Complete  HRI or Home Care Consult Complete  Social Work Consult for Recovery Care Planning/Counseling Complete  Palliative Care Screening Not Applicable  Medication Review Oceanographer) Complete

## 2023-11-06 NOTE — Progress Notes (Signed)
 Progress Note    11/06/2023 10:09 AM 1 Day Post-Op  Subjective:  Andrew Harding is a 73 yo male now POD #1 from:  Procedure(s) Performed:             1.  Ultrasound guidance for vascular access left femoral artery             2.  Catheter placement into right common femoral artery from left femoral approach             3.  Aortogram and selective right lower extremity angiogram             4.  Mechanical thrombectomy using the penumbra 6 bolt device in the right anterior tibial artery             5.  Angioplasty of the right anterior tibial artery with 2.5 mm diameter and 3 mm diameter angioplasty balloons in the midsegment and a 4 mm diameter Lutonix drug-coated balloon proximally             6.  Stent placement x 2 to the right anterior tibial artery with a 3 mm diameter by 38 mm length Esprit scaffolding system and a 3.5 mm diameter by 38 mm length Esprit scaffolding system             7.  Angioplasty of the right distal popliteal artery and tibioperoneal trunk with 5 mm diameter by 6 cm length Lutonix drug-coated angioplasty balloon             8.  Stent placement to the right tibioperoneal trunk with 6 mm diameter by 2.5 cm length Viabahn stent             9.  StarClose closure device left femoral artery  Patient also POD #4 from right foot trans metatarsal amputation.   Patient is resting comfortably in bed this morning.  Patient endorses he is feeling better overall.  He also endorses right leg feels much better.  Patient's right lower extremity is wrapped and covered with Ace bandage.  Patient endorses Dr. Ashley will be in later today to take the dressing down and take a look at his foot.  He is currently wearing a walking boot to help support his foot.  Patient states he is allowed to ambulate on his heel only.  No complications overnight vitals all remained stable.   Vitals:   11/06/23 0448 11/06/23 0751  BP: (!) 94/59 100/68  Pulse: 88 94  Resp:  16  Temp: 98.1 F (36.7 C) 98.4  F (36.9 C)  SpO2: 99% 97%   Physical Exam: Cardiac:  Irregular Rhythm HX of AFIB, On eliquis  and plavix . No murmurs appreciated.  Lungs:  Clear throughout on auscultation. No rales rhonchi or wheezing noted.  Incisions:  Right Trans Met and HX of left BKA. Extremities:  HX Left BKA. Right lower extremity warm to touch  Abdomen:  Positive bowel sounds throughout, soft, non tender and non distended.  Neurologic: AAOX3 answers all questions and follows commands appropriately.   CBC    Component Value Date/Time   WBC 9.2 11/06/2023 0537   RBC 3.17 (L) 11/06/2023 0537   HGB 9.0 (L) 11/06/2023 0537   HCT 28.5 (L) 11/06/2023 0537   PLT 225 11/06/2023 0537   MCV 89.9 11/06/2023 0537   MCH 28.4 11/06/2023 0537   MCHC 31.6 11/06/2023 0537   RDW 14.7 11/06/2023 0537   LYMPHSABS 0.9 11/06/2023 0537   MONOABS 0.8 11/06/2023 0537  EOSABS 0.1 11/06/2023 0537   BASOSABS 0.0 11/06/2023 0537    BMET    Component Value Date/Time   NA 137 11/06/2023 0537   K 4.6 11/06/2023 0537   CL 107 11/06/2023 0537   CO2 25 11/06/2023 0537   GLUCOSE 148 (H) 11/06/2023 0537   GLUCOSE 402 (H) 09/17/2012 1417   BUN 19 11/06/2023 0537   CREATININE 1.26 (H) 11/06/2023 0537   CALCIUM 8.5 (L) 11/06/2023 0537   GFRNONAA 60 (L) 11/06/2023 0537   GFRAA 58 (L) 06/02/2015 0656    INR    Component Value Date/Time   INR 1.3 (H) 05/10/2023 0939     Intake/Output Summary (Last 24 hours) at 11/06/2023 1009 Last data filed at 11/06/2023 0448 Gross per 24 hour  Intake --  Output 600 ml  Net -600 ml     Assessment/Plan:  73 y.o. male is s/p See Above 1 Day Post-Op   Patient is resting comfortably in bed this morning.  No complications post angiogram to his right lower extremity or TMA.  Surgical incision remains wrapped.  Right lower extremity is warm to touch down to the ankle.  PLAN Vascular surgery is okay with patient discharge at this time as long as it is deemed he is medically stable and ready for  discharge.  Patient to follow-up with vein and vascular surgery in clinic in 6 weeks with vascular arterial ultrasounds with ABI to his right lower extremity.  Patient was informed today about follow-up.  He verbalizes understanding.  Vascular surgery recommends he continue with his Eliquis  5 mg twice daily and Plavix  75 mg once daily.   DVT prophylaxis: Eliquis  5 mg twice daily, Plavix  75 mg daily.   [name] 11/06/2023 10:09 AM

## 2023-11-06 NOTE — Progress Notes (Signed)
 Progress Note   [name] FMW:[id number] DOB: Oct 08, 1950 DOA: 11/01/2023     5 DOS: the patient was seen and examined on 11/06/2023     Brief hospital course: From HPI IKEEM CLECKLER is a 73 y.o. 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 Ashley.  Patient reports compliance with his daily dressing changes.  He denies any improvement in his drainage or appearance of the foot since being on outpatient antibiotics.  Pain is adequately controlled. Denies any other acute issues.       Assessment and Plan:    Ulcer of right foot with bone involvement  s/p right first to amputation with poor wound healing  Patient underwent surgery by podiatry on 11/02/2023 Optimize glucose control Continue vancomycin  and cefepime  Patient was taken by vascular surgery for angiogram on 11/05/2023 Continue PT OT Infectious disease on board and awaiting final cultures to determine antibiotic discharge PT OT has recommended skilled nursing facility [facility] working on this   CAD s/p CABG PAF on eliquis   Continue Plavix  and Eliquis  as r resumed by vascular surgery Continue metoprolol  and statin   IDT2DM Continue monitoring glucose Continue insulin  therapy    Advance Care Planning:   Code Status: Full Code discussed with patient at time of admission   Consults: Podiatry   Subjective:  Patient seen and examined at bedside this morning Patient denies worsening foot pain nausea vomiting abdominal pain He prefers to go home but will accept to go to [facility] or [facility].  Case management informed and working on this  Physical Exam:     General: He is not in acute distress.    Appearance: He is not  ill-appearing or toxic-appearing.  HENT:     Head: Normocephalic and atraumatic.     Mouth/Throat:     Mouth: Mucous membranes are moist.  Eyes:     Extraocular Movements: Extraocular movements intact.     Pupils: Pupils are equal, round, and reactive to light.  Cardiovascular:     Rate and Rhythm: Normal rate and regular rhythm.  Pulmonary:     Effort: Pulmonary effort is normal. No respiratory distress.     Breath sounds: Normal breath sounds. No wheezing.  Abdominal:     General: Abdomen is flat. Bowel sounds are normal. There is no distension.     Palpations: Abdomen is soft.     Tenderness: There is no abdominal tenderness.  Musculoskeletal:     Cervical back: Normal range of motion and neck supple.     Right lower leg: No edema.     Comments: L BKA, prosthetic in place Right foot wrapped in immobilizer.  Foot in a clean and dry dressing   Skin:    Capillary Refill: Capillary refill takes less than 2 seconds.  Neurological:     General: No focal deficit present.     Mental Status: He is alert.  Psychiatric:        Mood and Affect: Mood normal.        Behavior: Behavior normal.    Disposition: Skilled nursing facility   Data Reviewed:        Latest Ref Rng & Units 11/06/2023    5:37 AM 11/05/2023  4:30 AM 11/04/2023    4:41 AM  BMP  Glucose 70 - 99 mg/dL 851  855  855   BUN 8 - 23 mg/dL 19  20  17    Creatinine 0.61 - 1.24 mg/dL 8.73  8.76  8.79   Sodium 135 - 145 mmol/L 137  136  136   Potassium 3.5 - 5.1 mmol/L 4.6  4.1  4.2   Chloride 98 - 111 mmol/L 107  105  106   CO2 22 - 32 mmol/L 25  24  23    Calcium 8.9 - 10.3 mg/dL 8.5  8.6  8.5        Latest Ref Rng & Units 11/06/2023    5:37 AM 11/05/2023    4:30 AM 11/04/2023    4:41 AM  CBC  WBC 4.0 - 10.5 K/uL 9.2  8.1  6.7   Hemoglobin 13.0 - 17.0 g/dL 9.0  9.9  9.7   Hematocrit 39.0 - 52.0 % 28.5  30.2  29.9   Platelets 150 - 400 K/uL 225  236  224     Vitals:   11/05/23 1605 11/05/23 1933 11/06/23 0448  11/06/23 0751  BP: 125/67 127/74 (!) 94/59 100/68  Pulse: 88 86 88 94  Resp: 16 18  16   Temp: 98.2 F (36.8 C) 99 F (37.2 C) 98.1 F (36.7 C) 98.4 F (36.9 C)  TempSrc: Oral  Oral Oral  SpO2: 99% 99% 99% 97%  Weight:      Height:        Time spent: 41 minutes  Author: Drue ONEIDA Potter, MD 11/06/2023 3:10 PM  For on call review [email or web address].

## 2023-11-06 NOTE — Progress Notes (Signed)
 Physical Therapy Treatment Patient Details Name: Andrew Harding MRN: [id number] DOB: 01-Jul-1950 Today's Date: 11/06/2023   History of Present Illness MONTA POLICE is a 73 y.o. 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 Ashley.    PT Comments  Pt able to partake in increased volume of transfers training, emphasis on LLE use for majority of effort. Pt also increases tolerance to stance time on prosthetic leg without LOB. Pt report incongruent interface of WC and household- knee scooter may improve NWB access to living environment, pt asks about trial next session. Explained need for secondary apparatus for ADL tasks given need for 2 hands on wheel while scootering. Pt is worried that insurance restrictions will be a barrier to having bed offers at preferential facilities. Will need to begin stairs training soon as well.     If plan is discharge home, recommend the following: Help with stairs or ramp for entrance;Assistance with cooking/housework;Assist for transportation   Can travel by private vehicle        Equipment Recommendations       Recommendations for Other Services       Precautions / Restrictions Precautions Precautions: Fall Recall of Precautions/Restrictions: Intact Restrictions RLE Weight Bearing Per Provider Order: Partial weight bearing Other Position/Activity Restrictions: heel weight bearing transfers only     Mobility  Bed Mobility Overal bed mobility: Independent                  Transfers Overall transfer level: Needs assistance Equipment used: Rolling walker (2 wheels) Transfers: Sit to/from Stand Sit to Stand: Supervision, From elevated surface                Ambulation/Gait                General Gait Details: Not cleared for AMB at this time; xfers only.   Stairs             Wheelchair Mobility     Tilt Bed    Modified Rankin (Stroke Patients Only)       Balance                                            Communication    Cognition                                        Cueing    Exercises Other Exercises Other Exercises: STS from elevated surface height, cues for as little RLE use as possible x10 Other Exercises: standing RLE marching in place x15    General Comments        Pertinent Vitals/Pain Pain Assessment Pain Assessment: No/denies pain    Home Living                          Prior Function            PT Goals (current goals can now be found in the care plan section) Acute Rehab PT Goals Patient Stated Goal: return home PT Goal Formulation:  With patient Time For Goal Achievement: 11/10/23 Potential to Achieve Goals: Good Progress towards PT goals: Progressing toward goals    Frequency    Min 2X/week      PT Plan      Co-evaluation              AM-PAC PT 6 Clicks Mobility   Outcome Measure  Help needed turning from your back to your side while in a flat bed without using bedrails?: None Help needed moving from lying on your back to sitting on the side of a flat bed without using bedrails?: None Help needed moving to and from a bed to a chair (including a wheelchair)?: A Little Help needed standing up from a chair using your arms (e.g., wheelchair or bedside chair)?: A Little Help needed to walk in hospital room?: A Little Help needed climbing 3-5 steps with a railing? : A Lot 6 Click Score: 19    End of Session   Activity Tolerance: Patient tolerated treatment well;No increased pain Patient left: in chair;with call bell/phone within reach;with chair alarm set;with family/visitor present Nurse Communication: Mobility status PT Visit Diagnosis:  Unsteadiness on feet (R26.81);Other abnormalities of gait and mobility (R26.89)     Time: 8683-8662 PT Time Calculation (min) (ACUTE ONLY): 21 min  Charges:    $Therapeutic Exercise: 8-22 mins PT General Charges $$ ACUTE PT VISIT: 1 Visit                    1:49 PM, 11/06/23 Peggye JAYSON Linear, PT, DPT Physical Therapist - [facility]  [phone] (ASCOM)    Janith Nielson C 11/06/2023, 1:45 PM

## 2023-11-07 ENCOUNTER — Inpatient Hospital Stay

## 2023-11-07 ENCOUNTER — Other Ambulatory Visit: Payer: Self-pay

## 2023-11-07 ENCOUNTER — Other Ambulatory Visit (HOSPITAL_COMMUNITY): Payer: Self-pay

## 2023-11-07 ENCOUNTER — Telehealth (HOSPITAL_COMMUNITY): Payer: Self-pay | Admitting: Pharmacy Technician

## 2023-11-07 DIAGNOSIS — M86071 Acute hematogenous osteomyelitis, right ankle and foot: Secondary | ICD-10-CM

## 2023-11-07 DIAGNOSIS — I739 Peripheral vascular disease, unspecified: Secondary | ICD-10-CM | POA: Diagnosis not present

## 2023-11-07 DIAGNOSIS — E11628 Type 2 diabetes mellitus with other skin complications: Secondary | ICD-10-CM

## 2023-11-07 DIAGNOSIS — D509 Iron deficiency anemia, unspecified: Secondary | ICD-10-CM | POA: Insufficient documentation

## 2023-11-07 DIAGNOSIS — D508 Other iron deficiency anemias: Secondary | ICD-10-CM

## 2023-11-07 DIAGNOSIS — B952 Enterococcus as the cause of diseases classified elsewhere: Secondary | ICD-10-CM | POA: Diagnosis not present

## 2023-11-07 DIAGNOSIS — B9562 Methicillin resistant Staphylococcus aureus infection as the cause of diseases classified elsewhere: Secondary | ICD-10-CM | POA: Diagnosis not present

## 2023-11-07 DIAGNOSIS — L97519 Non-pressure chronic ulcer of other part of right foot with unspecified severity: Secondary | ICD-10-CM | POA: Diagnosis not present

## 2023-11-07 LAB — BASIC METABOLIC PANEL WITH GFR
Anion gap: 7 (ref 5–15)
BUN: 18 mg/dL (ref 8–23)
CO2: 24 mmol/L (ref 22–32)
Calcium: 8.5 mg/dL — ABNORMAL LOW (ref 8.9–10.3)
Chloride: 106 mmol/L (ref 98–111)
Creatinine, Ser: 1.26 mg/dL — ABNORMAL HIGH (ref 0.61–1.24)
GFR, Estimated: 60 mL/min — ABNORMAL LOW (ref >60)
Glucose, Bld: 168 mg/dL — ABNORMAL HIGH (ref 70–99)
Potassium: 4 mmol/L (ref 3.5–5.1)
Sodium: 137 mmol/L (ref 135–145)

## 2023-11-07 LAB — CBC WITH DIFFERENTIAL/PLATELET
Abs Immature Granulocytes: 0.05 K/uL (ref 0.00–0.07)
Basophils Absolute: 0 K/uL (ref 0.0–0.1)
Basophils Relative: 0 %
Eosinophils Absolute: 0.1 K/uL (ref 0.0–0.5)
Eosinophils Relative: 1 %
HCT: 26.4 % — ABNORMAL LOW (ref 39.0–52.0)
Hemoglobin: 8.5 g/dL — ABNORMAL LOW (ref 13.0–17.0)
Immature Granulocytes: 1 %
Lymphocytes Relative: 12 %
Lymphs Abs: 0.9 K/uL (ref 0.7–4.0)
MCH: 28.9 pg (ref 26.0–34.0)
MCHC: 32.2 g/dL (ref 30.0–36.0)
MCV: 89.8 fL (ref 80.0–100.0)
Monocytes Absolute: 0.6 K/uL (ref 0.1–1.0)
Monocytes Relative: 8 %
Neutro Abs: 5.8 K/uL (ref 1.7–7.7)
Neutrophils Relative %: 78 %
Platelets: 196 K/uL (ref 150–400)
RBC: 2.94 MIL/uL — ABNORMAL LOW (ref 4.22–5.81)
RDW: 14.7 % (ref 11.5–15.5)
WBC: 7.5 K/uL (ref 4.0–10.5)
nRBC: 0 % (ref 0.0–0.2)

## 2023-11-07 LAB — IRON AND TIBC
Iron: 13 ug/dL — ABNORMAL LOW (ref 45–182)
Saturation Ratios: 6 % — ABNORMAL LOW (ref 17.9–39.5)
TIBC: 214 ug/dL — ABNORMAL LOW (ref 250–450)
UIBC: 201 ug/dL

## 2023-11-07 LAB — GLUCOSE, CAPILLARY
Glucose-Capillary: 171 mg/dL — ABNORMAL HIGH (ref 70–99)
Glucose-Capillary: 149 mg/dL — ABNORMAL HIGH (ref 70–99)
Glucose-Capillary: 141 mg/dL — ABNORMAL HIGH (ref 70–99)
Glucose-Capillary: 148 mg/dL — ABNORMAL HIGH (ref 70–99)

## 2023-11-07 LAB — AEROBIC/ANAEROBIC CULTURE W GRAM STAIN (SURGICAL/DEEP WOUND)

## 2023-11-07 LAB — FERRITIN: Ferritin: 194 ng/mL (ref 24–336)

## 2023-11-07 LAB — VITAMIN B12: Vitamin B-12: 61 pg/mL — ABNORMAL LOW (ref 180–914)

## 2023-11-07 MED ORDER — POLYSACCHARIDE IRON COMPLEX 150 MG PO CAPS
150.0000 mg | ORAL_CAPSULE | Freq: Every day | ORAL | Status: DC
  Administered 2023-11-07 – 2023-11-08 (×2): 150 mg via ORAL
  Filled 2023-11-07 (×2): qty 1

## 2023-11-07 MED ORDER — SODIUM CHLORIDE 0.9% FLUSH: 10.0000 mL | INTRAVENOUS | Status: DC | PRN

## 2023-11-07 MED ORDER — SODIUM CHLORIDE 0.9% FLUSH
10.0000 mL | Freq: Two times a day (BID) | INTRAVENOUS | Status: DC
  Administered 2023-11-07 – 2023-11-08 (×3): 10 mL

## 2023-11-07 MED ORDER — CHLORHEXIDINE GLUCONATE CLOTH 2 % EX PADS
6.0000 | MEDICATED_PAD | Freq: Every day | CUTANEOUS | Status: DC
  Administered 2023-11-07 – 2023-11-08 (×2): 6 via TOPICAL

## 2023-11-07 NOTE — Progress Notes (Signed)
  Progress Note   [name] FMW:[id number] DOB: 15-Aug-1950 DOA: 11/01/2023     6 DOS: the patient was seen and examined on 11/07/2023   Brief hospital course: From HPI SIRUS LABRIE is a 73 y.o. 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 Culture from bone biopsy grew MRSA, patient followed by ID, antibiotic switched to daptomycin .     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 POA Right lower extremity peripheral arterial disease. s/p right first to amputation with poor wound healing  Patient underwent surgery by podiatry on 11/02/2023 Right LE angiogram on 11/05/2023 with angioplasty, mechanical thrombectomy and stent placed. Culture from bone biopsy grew MRSA, seen by ID, initially treated with vancomycin  and cefepime , changed to daptomycin . Will place a PICC line. Discussed with infectious disease Patient also been seen by PT/OT, recommending nursing home placement.  Discussed with TOC, pending insurance authorization.   CAD s/p CABG PAF on eliquis   Resumed dual antiplatelet treatment, statin.  Patient also on metoprolol .   IDT2DM Continue current treatment.  Iron  deficient anemia. Hemoglobin 8.5, significant iron  deficiency, B12 pending.  Start iron  supplement.  Acute kidney injury. Renal function slightly higher than baseline.  Continue to follow.       Subjective:  Patient  doing well today, no short of breath.  Physical Exam: Vitals:   11/06/23 1523 11/06/23 1955 11/07/23 0233 11/07/23 0834  BP: 108/75 111/64 112/81 110/60  Pulse: 93 95 84 92  Resp: 17 18 18 17   Temp: 99.8 F (37.7 C) 98.9 F (37.2 C) 98.2 F (36.8 C) 98.7 F (37.1 C)  TempSrc:    Oral  SpO2: 99% 98% 100% 100%  Weight:      Height:       General exam: Appears calm and comfortable  Respiratory system: Clear to auscultation. Respiratory effort normal. Cardiovascular system: S1 & S2 heard, RRR. No JVD, murmurs, rubs, gallops or clicks. Gastrointestinal system: Abdomen is nondistended, soft and nontender. No organomegaly or masses felt. Normal bowel sounds heard. Central nervous system: Alert and oriented. No focal neurological deficits. Extremities: Left BKA. Skin: No rashes, lesions or ulcers Psychiatry: Judgement and insight appear normal. Mood & affect appropriate.    Data Reviewed:  Lab Results, MRI results reviewed.  Family Communication: None  Disposition: Status is: Inpatient Remains inpatient appropriate because: Severity of disease, IV treatment.     Time spent: 55 minutes  Author: Murvin Mana, MD 11/07/2023 12:29 PM  For on call review [email or web address].

## 2023-11-07 NOTE — Telephone Encounter (Signed)
 Patient Product/process development scientist completed.    The patient is insured through [facility]. Patient has [employer], may use a copay card, and/or apply for patient assistance if available.    Ran test claim for linezolid 600 mg and the current 14 day co-pay is $16.00.   This test claim was processed through [facility]- copay amounts may vary at other pharmacies due to pharmacy/plan contracts, or as the patient moves through the different stages of their insurance plan.     Reyes Sharps, CPHT Pharmacy Technician III Certified Patient Advocate [facility] Patient Advocate Team Direct Number: [phone]  Fax: [phone]

## 2023-11-07 NOTE — Hospital Course (Signed)
 From HPI Andrew Harding is a 73 y.o. 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 Culture from bone biopsy grew MRSA, patient followed by ID, antibiotic switched to daptomycin .

## 2023-11-07 NOTE — Plan of Care (Signed)
  Problem: Coping: Goal: Ability to adjust to condition or change in health will improve Outcome: Progressing   Problem: Activity: Goal: Risk for activity intolerance will decrease Outcome: Progressing   Problem: Nutrition: Goal: Adequate nutrition will be maintained Outcome: Progressing   Problem: Coping: Goal: Level of anxiety will decrease Outcome: Progressing

## 2023-11-07 NOTE — Progress Notes (Signed)
 Physical Therapy Treatment Patient Details Name: Andrew Harding MRN: [id number] DOB: July 22, 1950 Today's Date: 11/07/2023   History of Present Illness Andrew Harding is a 73 y.o. 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 Ashley.    PT Comments  Pt continues to be at goal for pain, remains aware of heel weightbearing status, but dino reminds him that this is really a consolation prize of sorts, that its more likely that he should be NWB to promote optimal heeling, that heel weightbearing is only permitted to facilitate transfers. Author explained we will not be working on heel weightbearing AMB. Pt able to safely demonstrate BUE supported LLE SLS phase during transfers. Pt successful manages multiple mout/dismount of kneeling scooter, able to safely navigate unit and maintain balance. Pt feels such a device would allow for access to BR and bedroom in home, whereas a WC would not accommodate all of his living spaces at home. Still need to work on stairs training in preparation for eventual return to home.    If plan is discharge home, recommend the following: Help with stairs or ramp for entrance;Assistance with cooking/housework;Assist for transportation   Can travel by private vehicle        Equipment Recommendations  Other (comment) (kneeling scooter)    Recommendations for Other Services       Precautions / Restrictions Precautions Precautions: Fall Recall of Precautions/Restrictions: Intact Restrictions Weight Bearing Restrictions Per Provider Order: Yes Other Position/Activity Restrictions: heel weight bearing transfers only     Mobility  Bed Mobility Overal bed mobility: Independent                   Transfers Overall transfer level: Needs assistance Equipment used: Rolling walker (2 wheels) Transfers: Sit to/from Stand Sit to Stand: Supervision, From elevated surface           General transfer comment: x5    Ambulation/Gait               General Gait Details: Not cleared for AMB at this time; xfers only.   Stairs             Merchant navy officer mobility: Yes Wheelchair propulsion: Both upper extremities, Left lower extremity Pension scheme manager;) Distance: 220 Wheelchair Assistance Details (indicate cue type and reason): out of room to desk and back then knee platform is adjusted.   Tilt Bed    Modified Rankin (Stroke Patients Only)       Balance                                            Communication    Cognition                                        Cueing    Exercises Other Exercises Other Exercises: standing RLE marching in place x15    General Comments        Pertinent Vitals/Pain Pain Assessment Pain Assessment: No/denies pain    Home Living  Prior Function            PT Goals (current goals can now be found in the care plan section) Acute Rehab PT Goals Patient Stated Goal: return home PT Goal Formulation: With patient Time For Goal Achievement: 11/10/23 Potential to Achieve Goals: Good Progress towards PT goals: Progressing toward goals    Frequency    Min 2X/week      PT Plan      Co-evaluation              AM-PAC PT 6 Clicks Mobility   Outcome Measure  Help needed turning from your back to your side while in a flat bed without using bedrails?: None Help needed moving from lying on your back to sitting on the side of a flat bed without using bedrails?: None Help needed moving to and from a bed to a chair (including a wheelchair)?: A Little Help needed standing up from a chair using  your arms (e.g., wheelchair or bedside chair)?: A Little Help needed to walk in hospital room?: A Lot Help needed climbing 3-5 steps with a railing? : A Lot 6 Click Score: 18    End of Session Equipment Utilized During Treatment: Gait belt Activity Tolerance: Patient tolerated treatment well;No increased pain Patient left: in chair;with call bell/phone within reach;with chair alarm set;with family/visitor present Nurse Communication: Mobility status PT Visit Diagnosis: Unsteadiness on feet (R26.81);Other abnormalities of gait and mobility (R26.89)     Time: 8654-8581 PT Time Calculation (min) (ACUTE ONLY): 33 min  Charges:    $Therapeutic Activity: 23-37 mins PT General Charges $$ ACUTE PT VISIT: 1 Visit                    4:05 PM, 11/07/23 Andrew Harding, PT, DPT Physical Therapist - [facility]  [phone] (ASCOM)    Andrew Harding 11/07/2023, 4:01 PM

## 2023-11-07 NOTE — TOC Progression Note (Signed)
 Transition of Care [facility]) - Progression Note    Patient Details  Name: Andrew Harding MRN: [id number] Date of Birth: 1950/09/12  Transition of Care [facility]) CM/SW Contact  [place], [place] Phone Number: 11/07/2023, 11:49 AM  Clinical Narrative:      CSW met with patient. Pt advised that he received a bed offer at [facility]. Auth will be started by the facility.       Expected Discharge Plan and Services                                               Social Determinants of Health (SDOH) Interventions SDOH Screenings   Food Insecurity: No Food Insecurity (11/01/2023)  Housing: Low Risk  (11/01/2023)  Transportation Needs: No Transportation Needs (11/01/2023)  Utilities: Not At Risk (11/01/2023)  Depression (PHQ2-9): Low Risk  (05/03/2023)  Financial Resource Strain: Low Risk  (12/13/2022)   Received from [facility]  Social Connections: Moderately Isolated (11/01/2023)  Tobacco Use: Medium Risk (11/01/2023)    Readmission Risk Interventions    02/28/2023   12:53 PM  Readmission Risk Prevention Plan  Transportation Screening Complete  PCP or Specialist Appt within 3-5 Days Complete  HRI or Home Care Consult Complete  Social Work Consult for Recovery Care Planning/Counseling Complete  Palliative Care Screening Not Applicable  Medication Review Oceanographer) Complete

## 2023-11-07 NOTE — Progress Notes (Signed)
 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 To discharging provider:  please sign these orders via discharge navigator,  Select New Orders & click on the button choice - Manage This Unsigned Work.     Thank you for allowing pharmacy to be a part of this patient's care. Demetry Bendickson, PharmD, BCPS, BCIDP Work Cell: [phone] 11/07/2023 10:30 AM

## 2023-11-07 NOTE — Progress Notes (Signed)
 Date of Admission:  11/01/2023      ID: Andrew Harding is a 73 y.o. 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) Active Problems:   Benign essential hypertension   Coronary artery disease   Atrial fibrillation (HCC)   S/P BKA (below knee amputation) unilateral, left (HCC)   Osteomyelitis of right foot (HCC)   Iron  deficiency anemia    Subjective: Doing better No specific complaint  Medications:   apixaban   5 mg Oral BID   Chlorhexidine  Gluconate Cloth  6 each Topical Daily   clopidogrel   75 mg Oral Daily   gabapentin   300 mg Oral TID   insulin  aspart  0-15 Units Subcutaneous TID WC   insulin  aspart  0-5 Units Subcutaneous QHS   insulin  glargine-yfgn  28 Units Subcutaneous QHS   iron  polysaccharides  150 mg Oral Daily   metoprolol  tartrate  25 mg Oral BID   sodium chloride  flush  10-40 mL Intracatheter Q12H    Objective: Vital signs in last 24 hours: Patient Vitals for the past 24 hrs:  BP Temp Temp src Pulse Resp SpO2  11/07/23 2000 119/68 -- -- 82 17 100 %  11/07/23 1554 108/66 (!) 97.3 F (36.3 C) -- 92 17 100 %  11/07/23 0834 110/60 98.7 F (37.1 C) Oral 92 17 100 %  11/07/23 0233 112/81 98.2 F (36.8 C) -- 84 18 100 %     LDA Rt PICC  PHYSICAL EXAM:  General: Alert, cooperative, no distress, appears stated age.  Head: Normocephalic, without obvious abnormality, atraumatic. Eyes: Conjunctivae clear, anicteric sclerae. Pupils are equal ENT Nares normal. No drainage or sinus tenderness. Lips, mucosa, and tongue normal. No Thrush Neck: Supple, symmetrical, no adenopathy, thyroid: non tender no carotid bruit and no JVD. Back: No CVA tenderness. Lungs: Clear to auscultation bilaterally. No Wheezing or Rhonchi. No rales. Heart: Regular rate and rhythm, no murmur, rub or gallop. Abdomen: Soft, non-tender,not distended. Bowel sounds normal. No masses Extremities: rt foot TMA site covered with dressing     Left BKA Skin: No rashes  or lesions. Or bruising Lymph: Cervical, supraclavicular normal. Neurologic: Grossly non-focal  Lab Results    Latest Ref Rng & Units 11/07/2023    4:25 AM 11/06/2023    5:37 AM 11/05/2023    4:30 AM  CBC  WBC 4.0 - 10.5 K/uL 7.5  9.2  8.1   Hemoglobin 13.0 - 17.0 g/dL 8.5  9.0  9.9   Hematocrit 39.0 - 52.0 % 26.4  28.5  30.2   Platelets 150 - 400 K/uL 196  225  236        Latest Ref Rng & Units 11/07/2023    4:25 AM 11/06/2023    5:37 AM 11/05/2023    4:30 AM  CMP  Glucose 70 - 99 mg/dL 831  851  855   BUN 8 - 23 mg/dL 18  19  20    Creatinine 0.61 - 1.24 mg/dL 8.73  8.73  8.76   Sodium 135 - 145 mmol/L 137  137  136   Potassium 3.5 - 5.1 mmol/L 4.0  4.6  4.1   Chloride 98 - 111 mmol/L 106  107  105   CO2 22 - 32 mmol/L 24  25  24    Calcium 8.9 - 10.3 mg/dL 8.5  8.5  8.6       Microbiology: WC- MRSA enterococcus Studies/Results: DG Chest Port 1 View Result Date: 11/07/2023 CLINICAL DATA:  [id number] S/P PICC central line placement [id number] EXAM: PORTABLE CHEST - 1 VIEW COMPARISON:  October 08, 2023 FINDINGS: Right PICC has been placed in the interim, terminating in the lower SVC. No focal airspace consolidation, pleural effusion, or pneumothorax. No cardiomegaly. Sternotomy wires and CABG markers. Tortuous aorta with aortic atherosclerosis. No acute fracture or destructive lesions. Multilevel thoracic osteophytosis. IMPRESSION: Right PICC terminates in the lower SVC. Electronically Signed   By: Rogelia Myers M.D.   On: 11/07/2023 14:10   US  EKG SITE RITE Result Date: 11/07/2023 If Site Rite image not attached, placement could not be confirmed due to current cardiac rhythm.    Assessment/Plan:  Diabetic foot infection with peripheral artery disease involving the right foot at the site of prior amputation of the great toe Underwent TMA on 11/02/2023 Culture MRSA/enterococcus- on daptomycin  now Bone pathology acute osteo , margin clear of infection Discussed with Dr.Fowler- will treat  aggressively as risk for further amputation Will do Iv dapto for 4 weeks   History of left BKA Site is healthy   CAD Status post CABG   A-fib on  Eliquis    Discussed hte management with the patient OPAT entered

## 2023-11-07 NOTE — Treatment Plan (Signed)
 Diagnosis: Acute osteomyelitis of rt foot Baseline Creatinine 1.26  Culture Result: MRSA  No Known Allergies  OPAT Orders Discharge antibiotics: Daptomycin  700mg  IV every 24 hours ( 8mg /kg)  Duration: 4 weeks End Date:11/30/23 May extend   [facility] Care Per Protocol:  Labs weekly while on IV antibiotics: _X_ CBC with differential _X_ CK _X_ CMP _X_ CRP _X_ ESR   _ _X_ Please leave PIC in place until doctor has seen patient or been notified  Fax weekly lab results  promptly to [phone]  Clinic Follow Up Appt: 11/20/23 at 10.45 am with Dr.Kaelin Holford   Call [phone] with critical values or any questions

## 2023-11-07 NOTE — Progress Notes (Signed)
 Peripherally Inserted Central Catheter Placement  The IV Nurse has discussed with the patient and/or persons authorized to consent for the patient, the purpose of this procedure and the potential benefits and risks involved with this procedure.  The benefits include less needle sticks, lab draws from the catheter, and the patient may be discharged home with the catheter. Risks include, but not limited to, infection, bleeding, blood clot (thrombus formation), and puncture of an artery; nerve damage and irregular heartbeat and possibility to perform a PICC exchange if needed/ordered by physician.  Alternatives to this procedure were also discussed.  Bard Power PICC patient education guide, fact sheet on infection prevention and patient information card has been provided to patient /or left at bedside.    PICC Placement Documentation  PICC Single Lumen 11/07/23 Right Basilic 40 cm 0 cm (Active)  Indication for Insertion or Continuance of Line Home intravenous therapies (PICC only) 11/07/23 1313  Exposed Catheter (cm) 0 cm 11/07/23 1313  Site Assessment Clean, Dry, Intact 11/07/23 1313  Line Status Saline locked;Blood return noted 11/07/23 1313  Dressing Type Transparent;Securing device 11/07/23 1313  Dressing Status Antimicrobial disc/dressing in place;Clean, Dry, Intact 11/07/23 1313  Line Care Connections checked and tightened 11/07/23 1313  Line Adjustment (NICU/IV Team Only) No 11/07/23 1313  Dressing Intervention New dressing;Adhesive placed at insertion site (IV team only) 11/07/23 1313  Dressing Change Due 11/14/23 11/07/23 1313       Matthias Merle Chenice 11/07/2023, 1:14 PM

## 2023-11-08 DIAGNOSIS — D510 Vitamin B12 deficiency anemia due to intrinsic factor deficiency: Secondary | ICD-10-CM

## 2023-11-08 DIAGNOSIS — M86171 Other acute osteomyelitis, right ankle and foot: Secondary | ICD-10-CM | POA: Diagnosis not present

## 2023-11-08 DIAGNOSIS — D519 Vitamin B12 deficiency anemia, unspecified: Secondary | ICD-10-CM | POA: Insufficient documentation

## 2023-11-08 DIAGNOSIS — D508 Other iron deficiency anemias: Secondary | ICD-10-CM | POA: Diagnosis not present

## 2023-11-08 LAB — GLUCOSE, CAPILLARY
Glucose-Capillary: 144 mg/dL — ABNORMAL HIGH (ref 70–99)
Glucose-Capillary: 121 mg/dL — ABNORMAL HIGH (ref 70–99)

## 2023-11-08 MED ORDER — VITAMIN B-12 1000 MCG PO TABS: 1000.0000 ug | ORAL_TABLET | Freq: Every day | ORAL | Status: AC

## 2023-11-08 MED ORDER — CYANOCOBALAMIN 1000 MCG/ML IJ SOLN
1000.0000 ug | Freq: Every day | INTRAMUSCULAR | Status: DC
  Administered 2023-11-08: 1000 ug via INTRAMUSCULAR
  Filled 2023-11-08: qty 1

## 2023-11-08 MED ORDER — DAPTOMYCIN IV (FOR PTA / DISCHARGE USE ONLY)
700.0000 mg | INTRAVENOUS | 0 refills | Status: AC
Start: 2023-11-08 — End: 2023-11-30

## 2023-11-08 MED ORDER — INSULIN GLARGINE 100 UNIT/ML SOLOSTAR PEN: 28.0000 [IU] | PEN_INJECTOR | Freq: Every day | SUBCUTANEOUS | Status: AC

## 2023-11-08 MED ORDER — POLYSACCHARIDE IRON COMPLEX 150 MG PO CAPS: 150.0000 mg | ORAL_CAPSULE | Freq: Every day | ORAL | Status: DC

## 2023-11-08 MED ORDER — OXYCODONE HCL 5 MG PO TABS: 5.0000 mg | ORAL_TABLET | Freq: Four times a day (QID) | ORAL | 0 refills | Status: DC | PRN

## 2023-11-08 NOTE — TOC Progression Note (Deleted)
 Transition of Care [facility]) - Progression Note    Patient Details  Name: Andrew Harding MRN: [id number] Date of Birth: [birth year]  Transition of Care [facility]) CM/SW Contact  [place] L Jalayiah Bibian, [place] Phone Number: 11/08/2023, 12:57 PM  Clinical Narrative:     Patient received a bed at [facility]. Patient being discharged today. No further TOC needs identified. Next of kin notified of transfer. Transportation set up with [facility].   TOC signing off.        Expected Discharge Plan and Services         Expected Discharge Date: 11/08/23                                     Social Determinants of Health (SDOH) Interventions SDOH Screenings   Food Insecurity: No Food Insecurity (11/01/2023)  Housing: Low Risk  (11/01/2023)  Transportation Needs: No Transportation Needs (11/01/2023)  Utilities: Not At Risk (11/01/2023)  Depression (PHQ2-9): Low Risk  (05/03/2023)  Financial Resource Strain: Low Risk  (12/13/2022)   Received [facility]  Social Connections: Moderately Isolated (11/01/2023)  Tobacco Use: Medium Risk (11/01/2023)    Readmission Risk Interventions    02/28/2023   12:53 PM  Readmission Risk Prevention Plan  Transportation Screening Complete  PCP or Specialist Appt within 3-5 Days Complete  HRI or Home Care Consult Complete  Social Work Consult for Recovery Care Planning/Counseling Complete  Palliative Care Screening Not Applicable  Medication Review Oceanographer) Complete

## 2023-11-08 NOTE — Progress Notes (Signed)
 Dressing changed today.  Continues to have improvement to the wound site.  Scant drainage from the incision site.  Good perfusion of the skin flaps.  Patient being sent to skilled nursing facility.  They can perform dressing changes as provided in the discharge instructions.  He will follow-up with me in 2 weeks.

## 2023-11-08 NOTE — Discharge Summary (Signed)
 Physician Discharge Summary   [name] MRN: [id number] DOB: 09-Mar-1951  Admit date:     11/01/2023  Discharge date: 11/08/23  Discharge Physician: Murvin Mana   PCP: Fernande Ophelia [name], MD   Recommendations at discharge:    Follow-up with PCP in 1 week. Follow-up with infectious disease in 3 weeks. 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 [facility] Course: From HPI Andrew Harding is a 73 y.o. 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 Culture from bone biopsy grew MRSA, patient followed by ID, antibiotic switched to daptomycin .    Assessment and Plan: Ulcer of right foot Right foot osteomyelitis secondary to MRSA. POA Right lower extremity peripheral arterial disease. s/p right first to amputation with poor wound healing  Patient underwent surgery by podiatry on 11/02/2023 Right LE angiogram on 11/05/2023 with angioplasty, mechanical thrombectomy and stent placed. Culture from bone biopsy grew MRSA, seen by ID, initially treated with vancomycin  and cefepime , changed to daptomycin . PICC line has been placed, patient will be followed with ID for continued daptomycin  infusion.  Acute stable for discharge, patient going to rehab   CAD s/p CABG PAF on eliquis    Resumed dual antiplatelet treatment, statin.  Patient also on metoprolol .   IDT2DM Resume home treatment   Iron  deficient anemia. Vitamin B12 deficient anemia Continue iron  and B12 supplements.   Acute kidney injury. Renal function slightly higher than baseline.            Consultants: Vascular surgery, ID, podiatry. Procedures performed: Lower extremity vascular intervention, Disposition: Skilled nursing facility Diet recommendation:  Discharge Diet Orders (From admission, onward)     Start     Ordered   11/08/23 0000  Diet Carb Modified        11/08/23 1147           Carb modified diet DISCHARGE MEDICATION: Allergies as of 11/08/2023   No Known Allergies      Medication List     STOP taking these medications    acetaminophen  325 MG tablet Commonly known as: TYLENOL    cefUROXime  500 MG tablet Commonly known as: CEFTIN    metFORMIN 500 MG 24 hr tablet Commonly known as: GLUCOPHAGE-XR   ondansetron  4 MG tablet Commonly known as: Zofran    oxyCODONE -acetaminophen  5-325 MG tablet Commonly known as: Percocet   pravastatin  80 MG tablet Commonly known as: PRAVACHOL        TAKE these medications    chlorhexidine  4 % external liquid Commonly known as: HIBICLENS  Apply 15 mLs (1 Application total) topically as directed for 30 doses. Use as directed daily for 5 days every other week for 6 weeks.   clopidogrel  75 MG tablet Commonly known as: PLAVIX  TAKE 1 TABLET BY MOUTH EVERY DAY WITH BREAKFAST   cyanocobalamin  1000 MCG tablet  Commonly known as: VITAMIN B12 Take 1 tablet (1,000 mcg total) by mouth daily.   daptomycin  IVPB Commonly known as: CUBICIN  Inject 700 mg into the vein daily for 22 days. 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   Eliquis  5 MG Tabs tablet Generic drug: apixaban  TAKE 1 TABLET(5 MG) BY MOUTH TWICE DAILY   Fifty50 Pen Needles 32G X 4 MM Misc Generic drug: Insulin  Pen Needle USE 4 TIMES DAILY   FreeStyle Libre 2 Sensor Misc   gabapentin  300 MG capsule Commonly known as: NEURONTIN  Take 1 capsule (300 mg total) by mouth 3 (three) times daily.   insulin  glargine 100 UNIT/ML Solostar Pen Commonly known as: LANTUS  Inject 28 Units into the skin at bedtime. What changed: how much to take   iron  polysaccharides 150 MG capsule Commonly known as: NIFEREX Take 1 capsule (150 mg total) by mouth daily. Start taking on: November 09, 2023   metoprolol  tartrate 25 MG tablet Commonly known as: LOPRESSOR  Take 1 tablet (25 mg total) by mouth 2 (two) times daily.   OneTouch Ultra test strip Generic drug: glucose blood Use 3 (three) times daily   oxyCODONE  5 MG immediate release tablet Commonly known as: Oxy IR/ROXICODONE  Take 1 tablet (5 mg total) by mouth every 6 (six) hours as needed for moderate pain (pain score 4-6). SNF use only.  Refills per SNF providers   Ozempic (0.25 or 0.5 MG/DOSE) 2 MG/3ML Sopn Generic drug: Semaglutide(0.25 or 0.5MG /DOS) Inject 0.75 mLs into the skin once a week.   traZODone  50 MG tablet Commonly known as: DESYREL  Take 50 mg by mouth at bedtime.               Discharge Care Instructions  (From admission, onward)           Start     Ordered   11/08/23 0000  Change dressing on IV access line weekly and PRN  (Home infusion instructions - [facility] )        11/08/23 1147   11/08/23 0000  Discharge wound care:        Comments: RN to follow   11/08/23 1147            Contact information for follow-up providers     Fernande Ophelia PARAS III, MD Follow up in 1 week(s).   Specialty: Internal Medicine Contact information: [address] [facility] [place] [address] [phone]         [name], DPM Follow up in 2 week(s).   Specialty: Podiatry Contact information: [address] [place] [address] [phone]         Fayette Bodily, MD Follow up in 3 week(s).   Specialty: Infectious Diseases Contact information: [address] [place] [address] [phone]              Contact information for after-discharge care     Destination     [facility]   Service: Skilled Nursing Contact information: [address] [place]  [address] [phone]                    Discharge Exam: Filed Weights   11/01/23 1225 11/01/23 1418 11/02/23 1031  Weight: 95.3 kg 85.3 kg 82.4 kg   General exam: Appears calm and comfortable  Respiratory system: Clear to auscultation. Respiratory effort normal. Cardiovascular system: S1 & S2 heard, RRR. No JVD, murmurs, rubs, gallops or clicks. No pedal edema. Gastrointestinal system: Abdomen is nondistended, soft and nontender. No organomegaly or masses felt. Normal bowel sounds heard. Central nervous system: Alert and oriented. No focal neurological deficits. Extremities: Left BKA Skin: No rashes, lesions or ulcers Psychiatry: Judgement and insight appear normal. Mood & affect appropriate.    Condition at discharge: good  The results of significant diagnostics from this hospitalization (including imaging, microbiology, ancillary and laboratory) are listed below for reference.   Imaging Studies: DG Chest Port 1 View Result Date: 11/07/2023 CLINICAL DATA:  [id number] S/P PICC central line placement [id number] EXAM: PORTABLE CHEST - 1 VIEW COMPARISON:  October 08, 2023 FINDINGS: Right PICC has been placed in the interim, terminating in the lower SVC. No focal airspace consolidation, pleural effusion, or pneumothorax. No cardiomegaly. Sternotomy wires and CABG markers. Tortuous aorta with aortic atherosclerosis. No acute fracture or destructive lesions. Multilevel thoracic osteophytosis. IMPRESSION: Right PICC terminates in the lower SVC. Electronically Signed   By: Rogelia Myers M.D.   On: 11/07/2023 14:10   US  EKG SITE RITE Result Date: 11/07/2023 If Site Rite image not attached, placement could not be confirmed due to current cardiac rhythm.  PERIPHERAL VASCULAR CATHETERIZATION Result Date: 11/05/2023 See surgical note for result.   Microbiology: Results for orders placed or performed during the hospital encounter of 11/01/23  Aerobic/Anaerobic Culture w Gram Stain (surgical/deep wound)     Status: None   Collection Time: 11/02/23 12:07 PM   Specimen: Foot, Right; Wound  Result Value Ref Range Status   Specimen Description TISSUE BONE  Final   Special Requests RIGHT FOOT AMPUTATION RT 1ST METATARSAL  Final   Gram Stain   Final    RARE WBC PRESENT, PREDOMINANTLY PMN RARE GRAM POSITIVE COCCI    Culture   Final    FEW STAPHYLOCOCCUS AUREUS SUSCEPTIBILITIES PERFORMED ON PREVIOUS CULTURE WITHIN THE LAST 5 DAYS. NO ANAEROBES ISOLATED Performed at [facility], [address], [place], [place] [address]    Report Status 11/07/2023 FINAL  Final  Aerobic/Anaerobic Culture w Gram Stain (surgical/deep wound)     Status: None (Preliminary result)   Collection Time: 11/02/23 12:09 PM   Specimen: Foot, Right; Wound  Result Value Ref Range Status   Specimen Description   Final    WOUND Performed at [facility], [address] [place], [place] [address]    Special Requests   Final    RIGHT FOOT AMPUTATION SWAB OF RT 1ST METATARSAL UCLER   Gram Stain   Final    RARE WBC PRESENT, PREDOMINANTLY PMN FEW GRAM POSITIVE COCCI    Culture    Final    FEW METHICILLIN RESISTANT STAPHYLOCOCCUS AUREUS FEW ENTEROCOCCUS FAECALIS NO ANAEROBES ISOLATED Sent to [facility] for further susceptibility testing. Performed at [facility], [address], [place], [place] [address]    Report Status PENDING  Incomplete   Organism ID, Bacteria METHICILLIN RESISTANT STAPHYLOCOCCUS AUREUS  Final   Organism ID, Bacteria ENTEROCOCCUS FAECALIS  Final      Susceptibility   Enterococcus faecalis - MIC*    AMPICILLIN  <=2 SENSITIVE Sensitive     VANCOMYCIN  1 SENSITIVE Sensitive     GENTAMICIN  SYNERGY SENSITIVE Sensitive     * FEW ENTEROCOCCUS FAECALIS   Methicillin resistant staphylococcus aureus - MIC*    CIPROFLOXACIN  >=8 RESISTANT Resistant     ERYTHROMYCIN >=8 RESISTANT Resistant     GENTAMICIN  <=0.5 SENSITIVE Sensitive     OXACILLIN >=4 RESISTANT Resistant     TETRACYCLINE >=16 RESISTANT Resistant     VANCOMYCIN  2 SENSITIVE Sensitive     TRIMETH /SULFA  <=10 SENSITIVE Sensitive     CLINDAMYCIN >=8 RESISTANT Resistant     RIFAMPIN <=0.5 SENSITIVE Sensitive     Inducible Clindamycin NEGATIVE Sensitive     LINEZOLID 4 SENSITIVE Sensitive     * FEW METHICILLIN RESISTANT STAPHYLOCOCCUS AUREUS    Labs: CBC: Recent Labs  Lab 11/03/23 0523 11/04/23 0441 11/05/23 0430 11/06/23 0537 11/07/23 0425  WBC 6.0 6.7 8.1 9.2 7.5  NEUTROABS 4.7 5.0 6.2 7.4 5.8  HGB 9.9* 9.7* 9.9* 9.0* 8.5*  HCT 30.5* 29.9* 30.2* 28.5* 26.4*  MCV 88.9 89.5 91.2 89.9 89.8  PLT 200 224 236 225 196   Basic Metabolic Panel: Recent Labs  Lab 11/03/23 0523 11/04/23 0441 11/05/23 0430 11/06/23 0537 11/07/23 0425  NA 137 136 136 137 137  K 4.4 4.2 4.1 4.6 4.0  CL 108 106 105 107 106  CO2 21* 23 24 25 24   GLUCOSE 177* 144* 144* 148* 168*  BUN 17 17 20 19 18   CREATININE 1.22 1.20 1.23 1.26* 1.26*  CALCIUM 8.8* 8.5* 8.6* 8.5* 8.5*   Liver Function Tests: No results for input(s): AST, ALT, ALKPHOS, BILITOT, PROT, ALBUMIN in the last 168  hours. CBG: Recent Labs  Lab 11/07/23 0834 11/07/23 1126 11/07/23 1626 11/07/23 2135 11/08/23 0826  GLUCAP 171* 149* 141* 148* 144*    Discharge time spent: greater than 30 minutes.  Signed: Murvin Mana, MD [facility] 11/08/2023

## 2023-11-08 NOTE — Progress Notes (Signed)
 [facility] was called and report was given to Nurse  Garrel. Patient leaving by [facility] on stretcher, no distress noted when leaving the building.

## 2023-11-08 NOTE — TOC Transition Note (Signed)
 Transition of Care [facility]) - Discharge Note   Patient Details  Name: Andrew Harding MRN: [id number] Date of Birth: [birth year]  [facility]) CM/SW Contact:  [place] L Chastin Garlitz, LCSW Phone Number: 11/08/2023, 1:17 PM   Clinical Narrative:     Patient received a bed at [facility]. Patient being discharged today. No further TOC needs identified. Next of kin notified of transfer. Transportation set up with [facility].   TOC signing off.          Patient Goals and CMS Choice            Discharge Placement                       Discharge Plan and Services Additional resources added to the After Visit Summary for                                       Social Drivers of Health (SDOH) Interventions SDOH Screenings   Food Insecurity: No Food Insecurity (11/01/2023)  Housing: Low Risk  (11/01/2023)  Transportation Needs: No Transportation Needs (11/01/2023)  Utilities: Not At Risk (11/01/2023)  Depression (PHQ2-9): Low Risk  (05/03/2023)  Financial Resource Strain: Low Risk  (12/13/2022)   Received from [facility]  Social Connections: Moderately Isolated (11/01/2023)  Tobacco Use: Medium Risk (11/01/2023)     Readmission Risk Interventions    02/28/2023   12:53 PM  Readmission Risk Prevention Plan  Transportation Screening Complete  PCP or Specialist Appt within 3-5 Days Complete  HRI or Home Care Consult Complete  Social Work Consult for Recovery Care Planning/Counseling Complete  Palliative Care Screening Not Applicable  Medication Review Oceanographer) Complete

## 2023-11-11 LAB — AEROBIC/ANAEROBIC CULTURE W GRAM STAIN (SURGICAL/DEEP WOUND)

## 2023-11-11 LAB — MIC RESULT

## 2023-11-11 LAB — MINIMUM INHIBITORY CONC. (1 DRUG)

## 2023-11-12 ENCOUNTER — Ambulatory Visit (INDEPENDENT_AMBULATORY_CARE_PROVIDER_SITE_OTHER): Admitting: Vascular Surgery

## 2023-11-12 LAB — LAB REPORT - SCANNED: EGFR: 72

## 2023-11-19 LAB — LAB REPORT - SCANNED: EGFR: 55

## 2023-11-20 ENCOUNTER — Ambulatory Visit: Attending: Infectious Diseases | Admitting: Infectious Diseases

## 2023-11-20 ENCOUNTER — Encounter: Payer: Self-pay | Admitting: Infectious Diseases

## 2023-11-20 VITALS — BP 115/74 | HR 69 | Temp 98.0°F

## 2023-11-20 DIAGNOSIS — Z89411 Acquired absence of right great toe: Secondary | ICD-10-CM

## 2023-11-20 DIAGNOSIS — I4891 Unspecified atrial fibrillation: Secondary | ICD-10-CM | POA: Diagnosis not present

## 2023-11-20 DIAGNOSIS — Z951 Presence of aortocoronary bypass graft: Secondary | ICD-10-CM | POA: Diagnosis not present

## 2023-11-20 DIAGNOSIS — L089 Local infection of the skin and subcutaneous tissue, unspecified: Secondary | ICD-10-CM | POA: Diagnosis not present

## 2023-11-20 DIAGNOSIS — E1169 Type 2 diabetes mellitus with other specified complication: Secondary | ICD-10-CM | POA: Insufficient documentation

## 2023-11-20 DIAGNOSIS — I739 Peripheral vascular disease, unspecified: Secondary | ICD-10-CM | POA: Diagnosis not present

## 2023-11-20 DIAGNOSIS — T8743 Infection of amputation stump, right lower extremity: Secondary | ICD-10-CM | POA: Diagnosis present

## 2023-11-20 DIAGNOSIS — Z794 Long term (current) use of insulin: Secondary | ICD-10-CM | POA: Insufficient documentation

## 2023-11-20 DIAGNOSIS — Z7902 Long term (current) use of antithrombotics/antiplatelets: Secondary | ICD-10-CM | POA: Diagnosis not present

## 2023-11-20 DIAGNOSIS — E11628 Type 2 diabetes mellitus with other skin complications: Secondary | ICD-10-CM | POA: Diagnosis not present

## 2023-11-20 DIAGNOSIS — Z7901 Long term (current) use of anticoagulants: Secondary | ICD-10-CM | POA: Diagnosis not present

## 2023-11-20 DIAGNOSIS — I2510 Atherosclerotic heart disease of native coronary artery without angina pectoris: Secondary | ICD-10-CM | POA: Diagnosis not present

## 2023-11-20 DIAGNOSIS — E1151 Type 2 diabetes mellitus with diabetic peripheral angiopathy without gangrene: Secondary | ICD-10-CM | POA: Diagnosis not present

## 2023-11-20 DIAGNOSIS — B9562 Methicillin resistant Staphylococcus aureus infection as the cause of diseases classified elsewhere: Secondary | ICD-10-CM | POA: Insufficient documentation

## 2023-11-20 DIAGNOSIS — Z89512 Acquired absence of left leg below knee: Secondary | ICD-10-CM | POA: Insufficient documentation

## 2023-11-20 DIAGNOSIS — E1140 Type 2 diabetes mellitus with diabetic neuropathy, unspecified: Secondary | ICD-10-CM | POA: Diagnosis not present

## 2023-11-20 DIAGNOSIS — M86171 Other acute osteomyelitis, right ankle and foot: Secondary | ICD-10-CM | POA: Diagnosis not present

## 2023-11-20 NOTE — Patient Instructions (Addendum)
 You are here for follow up of rt foot infection- you are on daptomycin  There is superficial ischemia of the surgical site Plan is to continue daptomycin  for 6 weeks- will extend by 2 more weeks Please follow up with Dr.Fowler and vascular  OPAT Diagnosis: Acute osteomyelitis of rt foot Baseline Creatinine 1.26   Culture Result: MRSA   Allergies  No Known Allergies     OPAT Orders Discharge antibiotics: Daptomycin  700mg  IV every 24 hours ( 8mg /kg)   Duration: 6 weeks End Date: 12/14/23      [facility] Care Per Protocol:   Labs weekly while on IV antibiotics: _X_ CBC with differential _X_ CK _X_ CMP _X_ CRP _X_ ESR     _ _X_ Please leave PIC in place until doctor has seen patient or been notified   Fax weekly lab results  promptly to [phone]   Clinic Follow Up Appt: 11/29/23 at 10.45 am with Dr.Zully Frane

## 2023-11-20 NOTE — Progress Notes (Signed)
 NAME: Andrew Harding  DOB: [birth year]  MRN: [id number]  Date/Time: 11/20/2023 11:48 AM   Subjective:  Follow up after recent hospitalization for rt foot infection ? Andrew Harding is a 73 y.o. with a history of diabetes mellitus, peripheral neuropathy, PAD left BKA ,CAD status post CABG, hypertension,  A-fib on Eliquis , right great toe amputation Presented to [facility] on 7/7  with nonhealing wound on right great toe at the site of the amputation Patient  in the month of May and underwent excision first metatarsal right foot along with excision of the sesamoid plantar right first MTP joint by Dr. Ashley..  Culture then was MRSA.  Patient placed on  Bactrim   Patient at that time also underwent angio and had angioplasty and stent placement to the right anterior tibial artery, percutaneous transluminal angioplasty of the right mid popliteal artery and the right tibioperoneal trunk. 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) 09/03/2023   Atrial fibrillation with RVR (HCC) 08/19/2022   Bladder neck obstruction    Cellulitis 08/17/2022   Chronic kidney disease    Coronary artery disease    a.) s/p 4v CABG in 2014   Diabetes mellitus without complication (HCC)    Diabetic neuropathy (HCC)    Diabetic peripheral neuropathy (HCC)    Diabetic ulcer of toe of left foot associated with diabetes mellitus of other type, limited to breakdown of skin (HCC) 12/21/2017   Diverticulosis    Gout     Gram-negative bacteremia 05/11/2023   Heart murmur    History of osteomyelitis 05/31/2015   Hypercholesteremia    Hyperlipidemia    Hypertension    Hypotension due to hypovolemia 08/17/2022   Infection of left foot 08/19/2022   MSSA bacteremia 02/28/2023   Open wound of left foot with complication 05/10/2023   Osteomyelitis (HCC) 05/31/2015   Peripheral neuropathy    Postural dizziness with presyncope 02/26/2023   S/P BKA (below knee amputation) unilateral, 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   S/P transmetatarsal amputation of foot, left (HCC) 08/29/2022   Sepsis (HCC) 08/17/2022   Sepsis (HCC) 05/10/2023   Status post amputation of toe of right foot (HCC) 11/01/2015   Tubular adenoma    Vitamin D  deficiency     Past Surgical History:  Procedure Laterality Date   ABDOMINAL AORTOGRAM W/LOWER EXTREMITY N/A 11/05/2023   Procedure: ABDOMINAL AORTOGRAM W/LOWER EXTREMITY;  Surgeon: Marea Selinda RAMAN, MD;  Location: [facility];  Service: Cardiovascular;  Laterality: N/A;   ACHILLES TENDON SURGERY Right 09/14/2023   Procedure: TENOTOMY, ACHILLES;  Surgeon: [name], DPM;  Location: [facility];  Service: Orthopedics/Podiatry;  Laterality: Right;   AMPUTATION Left 08/19/2022   Procedure: TRANSMETATARSAL AMPUTATION LEFT FOOT WITH IRRIGATION AND DEBRIDEMENT;  Surgeon: Lennie Barter, DPM;  Location: [facility];  Service: Podiatry;  Laterality: Left;   AMPUTATION Left 05/16/2023   Procedure: AMPUTATION BELOW KNEE;  Surgeon: Marea Selinda RAMAN, MD;  Location: [facility];  Service: General;  Laterality: Left;   AMPUTATION Right 09/14/2023   Procedure: AMPUTATION, FOOT, RAY;  Surgeon: [name], DPM;  Location: [facility];  Service: Orthopedics/Podiatry;  Laterality: Right;   AMPUTATION TOE Right 06/01/2015   Procedure: AMPUTATION TOE;  Surgeon: Donnice Cory, DPM;  Location: [facility];  Service: Podiatry;  Laterality: Right;   AMPUTATION TOE Left 08/11/2022   Procedure: AMPUTATION TOE 2,  3, 4;  Surgeon: [name], DPM;  Location: [facility];  Service: Podiatry;  Laterality: Left;   CATARACT EXTRACTION, BILATERAL     CIRCUMCISION N/A 06/12/2022   Procedure: CIRCUMCISION ADULT;  Surgeon: Penne Knee, MD;  Location: [facility];  Service: Urology;  Laterality: N/A;   COLONOSCOPY WITH PROPOFOL  N/A 02/14/2016   Procedure: COLONOSCOPY WITH PROPOFOL ;  Surgeon: Gladis RAYMOND Mariner, MD;  Location: [facility];  Service: Endoscopy;  Laterality: N/A;   COLONOSCOPY WITH PROPOFOL  N/A 01/07/2019   Procedure: COLONOSCOPY WITH PROPOFOL ;  Surgeon: Mariner Gladis RAYMOND, MD;  Location: [facility] ENDOSCOPY;  Service: Endoscopy;  Laterality: N/A;   CORONARY ARTERY BYPASS GRAFT N/A 08/2012   EXCISION PARTIAL PHALANX Right 06/01/2015   Procedure: EXCISION PARTIAL PHALANX /  BONE;  Surgeon: Donnice Cory, DPM;  Location: [facility];  Service: Podiatry;  Laterality: Right;   FLEXIBLE SIGMOIDOSCOPY N/A 05/29/2016   Procedure: FLEXIBLE SIGMOIDOSCOPY;  Surgeon: Gladis RAYMOND Mariner, MD;  Location: [facility] ENDOSCOPY;  Service: Endoscopy;  Laterality: N/A;   INCISION AND DRAINAGE Left 08/23/2022   Procedure: INCISION AND DRAINAGE;  Surgeon: [name], DPM;  Location: [facility];  Service: Podiatry;  Laterality: Left;   INCISION AND DRAINAGE OF WOUND Left 09/15/2022   Procedure: 11044 - DEBRIDE BONE and EXCISION IF 1ST METATARSAL BONE WITH  DELAY PRIMARY CLOSURE;  Surgeon: [name], DPM;  Location: [facility];  Service: Podiatry;  Laterality: Left;   IRRIGATION AND DEBRIDEMENT FOOT Left 02/28/2023   Procedure: IRRIGATION AND DEBRIDEMENT FOOT;  Surgeon: [name], DPM;  Location: [facility];  Service: Orthopedics/Podiatry;  Laterality: Left;   KNEE ARTHROSCOPY Left    LOWER EXTREMITY ANGIOGRAPHY Left 08/22/2022   Procedure: Lower Extremity Angiography;  Surgeon: Jama Cordella MATSU, MD;  Location: [facility];  Service: Cardiovascular;  Laterality: Left;   LOWER EXTREMITY ANGIOGRAPHY Left 05/11/2023    Procedure: Lower Extremity Angiography;  Surgeon: Marea Selinda RAMAN, MD;  Location: [facility];  Service: Cardiovascular;  Laterality: Left;   LOWER EXTREMITY ANGIOGRAPHY Right 09/11/2023   Procedure: Lower Extremity Angiography;  Surgeon: Jama Cordella MATSU, MD;  Location: [facility];  Service: Cardiovascular;  Laterality: Right;   LOWER EXTREMITY INTERVENTION Right 09/11/2023   Procedure: LOWER EXTREMITY INTERVENTION;  Surgeon: Jama Cordella MATSU, MD;  Location: [facility];  Service: Cardiovascular;  Laterality: Right;   TEE WITHOUT CARDIOVERSION N/A 03/01/2023   Procedure: TRANSESOPHAGEAL ECHOCARDIOGRAM;  Surgeon: Alluri, Keller BROCKS, MD;  Location: [facility];  Service: Cardiovascular;  Laterality: N/A;   TRANSMETATARSAL AMPUTATION Right 11/02/2023   Procedure: AMPUTATION, FOOT, TRANSMETATARSAL;  Surgeon: [name], DPM;  Location: [facility];  Service: Orthopedics/Podiatry;  Laterality: Right;   WOUND DEBRIDEMENT Left 09/15/2022   Procedure: 11043 - DEBRIDE SKIN. MUSCLE FASCIA;  Surgeon: [name], DPM;  Location: [facility];  Service: Podiatry;  Laterality: Left;    Social History   Socioeconomic History   Marital status: Divorced    Spouse name: Ashley,Angela C   Number of children: Not on file   Years of education: Not on file  Highest education level: Not on file  Occupational History   Not on file  Tobacco Use   Smoking status: Former    Types: Cigars   Smokeless tobacco: Never  Vaping Use   Vaping status: Never Used  Substance and Sexual Activity   Alcohol use: No   Drug use: No   Sexual activity: Never  Other Topics Concern   Not on file  Social History Narrative   Patient is legally separated and lives at home by himself. His ex -wife is his HCPOA. Neighbors and friends are his support system. He used to work for [employer]    Social Drivers of Corporate investment banker Strain: Low Risk  (12/13/2022)   Received from  [facility]   Overall Financial Resource Strain (CARDIA)    Difficulty of Paying Living Expenses: Not hard at all  Food Insecurity: No Food Insecurity (11/01/2023)   Hunger Vital Sign    Worried About Running Out of Food in the Last Year: Never true    Ran Out of Food in the Last Year: Never true  Transportation Needs: No Transportation Needs (11/01/2023)   PRAPARE - Administrator, Civil Service (Medical): No    Lack of Transportation (Non-Medical): No  Physical Activity: Not on file  Stress: Not on file  Social Connections: Moderately Isolated (11/01/2023)   Social Connection and Isolation Panel    Frequency of Communication with Friends and Family: More than three times a week    Frequency of Social Gatherings with Friends and Family: Twice a week    Attends Religious Services: More than 4 times per year    Active Member of [employer] or Organizations: No    Attends Banker Meetings: Never    Marital Status: Divorced  Catering manager Violence: Not At Risk (11/01/2023)   Humiliation, Afraid, Rape, and Kick questionnaire    Fear of Current or Ex-Partner: No    Emotionally Abused: No    Physically Abused: No    Sexually Abused: No    Family History  Problem Relation Age of Onset   Diabetes Mellitus II Mother    CAD Mother    No Known Allergies I? Current Outpatient Medications  Medication Sig Dispense Refill   chlorhexidine  (HIBICLENS ) 4 % external liquid Apply 15 mLs (1 Application total) topically as directed for 30 doses. Use as directed daily for 5 days every other week for 6 weeks. 946 mL 1   clopidogrel  (PLAVIX ) 75 MG tablet TAKE 1 TABLET BY MOUTH EVERY DAY WITH BREAKFAST 30 tablet 5   Continuous Glucose Sensor (FREESTYLE LIBRE 2 SENSOR) MISC      cyanocobalamin  (VITAMIN B12) 1000 MCG tablet Take 1 tablet (1,000 mcg total) by mouth daily.     daptomycin  (CUBICIN ) IVPB Inject 700 mg into the vein daily for 22 days. 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 100 capsule 0   ELIQUIS  5  MG TABS tablet TAKE 1 TABLET(5 MG) BY MOUTH TWICE DAILY 60 tablet 2   gabapentin  (NEURONTIN ) 300 MG capsule Take 1 capsule (300 mg total) by mouth 3 (three) times daily. 90 capsule 0   glucose blood (ONETOUCH ULTRA) test strip Use 3 (three) times daily     insulin  glargine (LANTUS ) 100 UNIT/ML Solostar Pen Inject 28 Units into the skin at bedtime.     Insulin  Pen Needle (FIFTY50 PEN NEEDLES) 32G X 4 MM MISC USE 4 TIMES DAILY     iron  polysaccharides (NIFEREX) 150 MG capsule Take 1 capsule (150 mg total) by mouth daily.     metoprolol  tartrate (LOPRESSOR ) 25 MG tablet Take 1 tablet (25 mg total) by mouth 2 (two) times daily. 60 tablet 0   oxyCODONE  (OXY IR/ROXICODONE ) 5 MG immediate release tablet Take 1 tablet (5 mg total) by mouth every 6 (six) hours as needed for moderate pain (pain score 4-6). SNF use only.  Refills per SNF providers 12 tablet 0   OZEMPIC, 0.25 OR 0.5 MG/DOSE, 2 MG/3ML SOPN Inject 0.75 mLs into the skin once a week.     traZODone  (DESYREL ) 50 MG tablet Take 50 mg by mouth at bedtime.     No current facility-administered medications for this visit.     Abtx:  Anti-infectives (From admission, onward)    None       REVIEW OF SYSTEMS:  Const: negative fever, negative chills, negative weight loss Eyes: negative diplopia or visual changes, negative eye pain ENT: negative coryza, negative sore throat Resp: negative  cough, hemoptysis, dyspnea Cards: negative for chest pain, palpitations, lower extremity edema GU: negative for frequency, dysuria and hematuria GI: Negative for abdominal pain, diarrhea, bleeding, constipation Skin: negative for rash and pruritus Heme: negative for easy bruising and gum/nose bleeding MS: as above Neurolo:negative for headaches, dizziness, vertigo, memory problems  Psych: negative for feelings of anxiety, depression  Endocrine: , diabetes Allergy/Immunology- negative for any medication or food allergies ?  Objective:  VITALS:  BP 115/74   Pulse 69   Temp 98 F (36.7 C) (Temporal)   SpO2 98%  LDA Rt PICC PHYSICAL EXAM:  General: Alert, cooperative, no distress, appears stated age. In wheel chair Head: Normocephalic, without obvious abnormality, atraumatic. Eyes: Conjunctivae clear, anicteric sclerae. Pupils are equal ENT Nares normal. No drainage or sinus tenderness. Lips, mucosa, and tongue normal. No Thrush Neck: Supple, symmetrical, no adenopathy, thyroid: non tender no carotid bruit and no JVD. Back: No CVA tenderness. Lungs: Clear to auscultation bilaterally. No Wheezing or Rhonchi. No rales. Heart: Regular rate and rhythm, no murmur, rub or gallop. Abdomen: Soft, non-tender,not distended. Bowel sounds normal. No masses Extremities: rt foot TMA site- has superficial ischemia /necrosis at the surgical site         Left leg BKA Prosthetic leg Skin: No rashes or lesions. Or bruising Lymph: Cervical, supraclavicular normal. Neurologic: Grossly non-focal Pertinent Labs Lab Results CBC    Component Value Date/Time   WBC 7.5 11/07/2023 0425   RBC 2.94 (L) 11/07/2023 0425   HGB 8.5 (L) 11/07/2023 0425   HCT 26.4 (L) 11/07/2023 0425   PLT 196 11/07/2023 0425   MCV 89.8 11/07/2023 0425   MCH 28.9 11/07/2023 0425   MCHC 32.2 11/07/2023 0425   RDW 14.7 11/07/2023 0425   LYMPHSABS 0.9 11/07/2023 0425   MONOABS 0.6 11/07/2023 0425   EOSABS 0.1  11/07/2023 0425   BASOSABS 0.0 11/07/2023 0425       Latest Ref Rng &  Units 11/07/2023    4:25 AM 11/06/2023    5:37 AM 11/05/2023    4:30 AM  CMP  Glucose 70 - 99 mg/dL 831  851  855   BUN 8 - 23 mg/dL 18  19  20    Creatinine 0.61 - 1.24 mg/dL 8.73  8.73  8.76   Sodium 135 - 145 mmol/L 137  137  136   Potassium 3.5 - 5.1 mmol/L 4.0  4.6  4.1   Chloride 98 - 111 mmol/L 106  107  105   CO2 22 - 32 mmol/L 24  25  24    Calcium 8.9 - 10.3 mg/dL 8.5  8.5  8.6      ? Impression/Recommendation ?Diabetic foot infection with peripheral artery disease involving the right foot at the site of prior amputation of the great toe Underwent TMA on 11/02/2023 Culture MRSA  Bone pathology acute osteo but margin clear. [facility]  Some ischemic necrosis of the skin at the surgical site He has a fu with Dr.Fowler on this Thursday Continue dapto follow up 10-14 days    History of left BKA    CAD Status post CABG   A-fib on  Eliquis    ?  ________________________________________________ Discussed with patient, and his friend- [facility]

## 2023-11-22 LAB — BASIC METABOLIC PANEL WITH GFR: EGFR: 58

## 2023-11-25 NOTE — Progress Notes (Signed)
 MRN : [id number]  Andrew Harding is a 73 y.o. ([birth year]) male who presents with chief complaint of check circulation.  History of Present Illness:   The patient returns to the office for followup and review status post angiogram with intervention on 11/05/2023.   Procedure:  Angioplasty of the right anterior tibial artery with 2.5 mm diameter and 3 mm diameter angioplasty balloons in the midsegment and a 4 mm diameter Lutonix drug-coated balloon proximally 2.    Stent placement x 2 to the right anterior tibial artery with a 3 mm diameter by 38 mm length Esprit scaffolding system and a 3.5 mm diameter by 38 mm length Esprit scaffolding system 3.     Angioplasty of the right distal popliteal artery and tibioperoneal trunk with 5 mm diameter by 6 cm length Lutonix drug-coated angioplasty balloon 4.     Stent placement to the right tibioperoneal trunk with 6 mm diameter by 2.5 cm length Viabahn stent  The patient notes improvement in the lower extremity symptoms. No interval shortening of the patient's claudication distance or rest pain symptoms. No new ulcers or wounds have occurred since the last visit.  There have been no significant changes to the patient's overall health care.  No documented history of amaurosis fugax or recent TIA symptoms. There are no recent neurological changes noted. No documented history of DVT, PE or superficial thrombophlebitis. The patient denies recent episodes of angina or shortness of breath.    No outpatient medications have been marked as taking for the 11/26/23 encounter (Appointment) with Jama, Cordella MATSU, MD.    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) 09/03/2023   Atrial fibrillation with RVR (HCC)  08/19/2022   Bladder neck obstruction    Cellulitis 08/17/2022   Chronic kidney disease    Coronary artery disease    a.) s/p 4v CABG in 2014   Diabetes mellitus without complication (HCC)    Diabetic neuropathy (HCC)    Diabetic peripheral neuropathy (HCC)    Diabetic ulcer of toe of left foot associated with diabetes mellitus of other type, limited to breakdown of skin (HCC) 12/21/2017   Diverticulosis    Gout    Gram-negative bacteremia 05/11/2023   Heart murmur    History of osteomyelitis 05/31/2015   Hypercholesteremia    Hyperlipidemia    Hypertension    Hypotension due to hypovolemia 08/17/2022   Infection of left foot 08/19/2022   MSSA bacteremia 02/28/2023   Open wound of left foot with complication 05/10/2023   Osteomyelitis (HCC) 05/31/2015   Peripheral neuropathy    Postural dizziness with presyncope 02/26/2023   S/P BKA (below knee amputation) unilateral, 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   S/P transmetatarsal amputation of foot, left (HCC) 08/29/2022   Sepsis (HCC) 08/17/2022   Sepsis (HCC) 05/10/2023   Status post amputation of toe of right foot (HCC) 11/01/2015   Tubular adenoma  Vitamin D  deficiency     Past Surgical History:  Procedure Laterality Date   ABDOMINAL AORTOGRAM W/LOWER EXTREMITY N/A 11/05/2023   Procedure: ABDOMINAL AORTOGRAM W/LOWER EXTREMITY;  Surgeon: Marea Selinda RAMAN, MD;  Location: [facility];  Service: Cardiovascular;  Laterality: N/A;   ACHILLES TENDON SURGERY Right 09/14/2023   Procedure: TENOTOMY, ACHILLES;  Surgeon: [name], DPM;  Location: [facility];  Service: Orthopedics/Podiatry;  Laterality: Right;   AMPUTATION Left 08/19/2022   Procedure: TRANSMETATARSAL AMPUTATION LEFT FOOT WITH IRRIGATION AND DEBRIDEMENT;  Surgeon: Lennie Barter, DPM;  Location: [facility];  Service: Podiatry;  Laterality: Left;   AMPUTATION Left 05/16/2023   Procedure: AMPUTATION BELOW KNEE;  Surgeon: Marea Selinda RAMAN, MD;  Location: [facility];   Service: General;  Laterality: Left;   AMPUTATION Right 09/14/2023   Procedure: AMPUTATION, FOOT, RAY;  Surgeon: [name], DPM;  Location: [facility];  Service: Orthopedics/Podiatry;  Laterality: Right;   AMPUTATION TOE Right 06/01/2015   Procedure: AMPUTATION TOE;  Surgeon: Donnice Cory, DPM;  Location: [facility];  Service: Podiatry;  Laterality: Right;   AMPUTATION TOE Left 08/11/2022   Procedure: AMPUTATION TOE 2, 3, 4;  Surgeon: [name], DPM;  Location: [facility];  Service: Podiatry;  Laterality: Left;   CATARACT EXTRACTION, BILATERAL     CIRCUMCISION N/A 06/12/2022   Procedure: CIRCUMCISION ADULT;  Surgeon: Penne Knee, MD;  Location: [facility];  Service: Urology;  Laterality: N/A;   COLONOSCOPY WITH PROPOFOL  N/A 02/14/2016   Procedure: COLONOSCOPY WITH PROPOFOL ;  Surgeon: Gladis RAYMOND Mariner, MD;  Location: [facility];  Service: Endoscopy;  Laterality: N/A;   COLONOSCOPY WITH PROPOFOL  N/A 01/07/2019   Procedure: COLONOSCOPY WITH PROPOFOL ;  Surgeon: Mariner Gladis RAYMOND, MD;  Location: [facility] ENDOSCOPY;  Service: Endoscopy;  Laterality: N/A;   CORONARY ARTERY BYPASS GRAFT N/A 08/2012   EXCISION PARTIAL PHALANX Right 06/01/2015   Procedure: EXCISION PARTIAL PHALANX /  BONE;  Surgeon: Donnice Cory, DPM;  Location: [facility];  Service: Podiatry;  Laterality: Right;   FLEXIBLE SIGMOIDOSCOPY N/A 05/29/2016   Procedure: FLEXIBLE SIGMOIDOSCOPY;  Surgeon: Gladis RAYMOND Mariner, MD;  Location: [facility];  Service: Endoscopy;  Laterality: N/A;   INCISION AND DRAINAGE Left 08/23/2022   Procedure: INCISION AND DRAINAGE;  Surgeon: [name], DPM;  Location: [facility];  Service: Podiatry;  Laterality: Left;   INCISION AND DRAINAGE OF WOUND Left 09/15/2022   Procedure: 11044 - DEBRIDE BONE and EXCISION IF 1ST METATARSAL BONE WITH  DELAY PRIMARY CLOSURE;  Surgeon: [name], DPM;  Location: [facility];  Service: Podiatry;  Laterality: Left;   IRRIGATION AND DEBRIDEMENT FOOT Left  02/28/2023   Procedure: IRRIGATION AND DEBRIDEMENT FOOT;  Surgeon: [name], DPM;  Location: [facility];  Service: Orthopedics/Podiatry;  Laterality: Left;   KNEE ARTHROSCOPY Left    LOWER EXTREMITY ANGIOGRAPHY Left 08/22/2022   Procedure: Lower Extremity Angiography;  Surgeon: Jama Cordella MATSU, MD;  Location: [facility];  Service: Cardiovascular;  Laterality: Left;   LOWER EXTREMITY ANGIOGRAPHY Left 05/11/2023   Procedure: Lower Extremity Angiography;  Surgeon: Marea Selinda RAMAN, MD;  Location: [facility];  Service: Cardiovascular;  Laterality: Left;   LOWER EXTREMITY ANGIOGRAPHY Right 09/11/2023   Procedure: Lower Extremity Angiography;  Surgeon: Jama Cordella MATSU, MD;  Location: [facility];  Service: Cardiovascular;  Laterality: Right;   LOWER EXTREMITY INTERVENTION Right 09/11/2023   Procedure: LOWER EXTREMITY INTERVENTION;  Surgeon: Jama Cordella MATSU, MD;  Location: [facility];  Service: Cardiovascular;  Laterality:  Right;   TEE WITHOUT CARDIOVERSION N/A 03/01/2023   Procedure: TRANSESOPHAGEAL ECHOCARDIOGRAM;  Surgeon: Alluri, Keller BROCKS, MD;  Location: [facility];  Service: Cardiovascular;  Laterality: N/A;   TRANSMETATARSAL AMPUTATION Right 11/02/2023   Procedure: AMPUTATION, FOOT, TRANSMETATARSAL;  Surgeon: [name], DPM;  Location: [facility];  Service: Orthopedics/Podiatry;  Laterality: Right;   WOUND DEBRIDEMENT Left 09/15/2022   Procedure: 11043 - DEBRIDE SKIN. MUSCLE FASCIA;  Surgeon: [name], DPM;  Location: [facility];  Service: Podiatry;  Laterality: Left;    Social History Social History   Tobacco Use   Smoking status: Former    Types: Cigars   Smokeless tobacco: Never  Vaping Use   Vaping status: Never Used  Substance Use Topics   Alcohol use: No   Drug use: No    Family History Family History  Problem Relation Age of Onset   Diabetes Mellitus II Mother    CAD Mother     No Known Allergies   REVIEW OF SYSTEMS  (Negative unless checked)  Constitutional: [] Weight loss  [] Fever  [] Chills Cardiac: [] Chest pain   [] Chest pressure   [] Palpitations   [] Shortness of breath when laying flat   [] Shortness of breath with exertion. Vascular:  [x] Pain in legs with walking   [] Pain in legs at rest  [] History of DVT   [] Phlebitis   [] Swelling in legs   [] Varicose veins   [] Non-healing ulcers Pulmonary:   [] Uses home oxygen   [] Productive cough   [] Hemoptysis   [] Wheeze  [] COPD   [] Asthma Neurologic:  [] Dizziness   [] Seizures   [] History of stroke   [] History of TIA  [] Aphasia   [] Vissual changes   [] Weakness or numbness in arm   [] Weakness or numbness in leg Musculoskeletal:   [] Joint swelling   [] Joint pain   [] Low back pain Hematologic:  [] Easy bruising  [] Easy bleeding   [] Hypercoagulable state   [] Anemic Gastrointestinal:  [] Diarrhea   [] Vomiting  [] Gastroesophageal reflux/heartburn   [] Difficulty swallowing. Genitourinary:  [] Chronic kidney disease   [] Difficult urination  [] Frequent urination   [] Blood in urine Skin:  [] Rashes   [] Ulcers  Psychological:  [] History of anxiety   []  History of major depression.  Physical Examination  There were no vitals filed for this visit. There is no height or weight on file to calculate BMI. Gen: WD/WN, NAD Head: [place]/AT, No temporalis wasting.  Ear/Nose/Throat: Hearing grossly intact, nares w/o erythema or drainage Eyes: PER, EOMI, sclera nonicteric.  Neck: Supple, no masses.  No bruit or JVD.  Pulmonary:  Good air movement, no audible wheezing, no use of accessory muscles.  Cardiac: RRR, normal S1, S2, no Murmurs. Vascular:  mild trophic changes, no open wounds Vessel Right Left  Radial Palpable Palpable  PT Not Palpable BKA  DP Not Palpable BKA  Gastrointestinal: soft, non-distended. No guarding/no peritoneal signs.  Musculoskeletal: M/S 5/5 throughout.  No visible deformity.  Neurologic: CN 2-12 intact. Pain and light touch intact in extremities.  Symmetrical.   Speech is fluent. Motor exam as listed above. Psychiatric: Judgment intact, Mood & affect appropriate for pt's clinical situation. Dermatologic: No rashes or ulcers noted.  No changes consistent with cellulitis.   CBC Lab Results  Component Value Date   WBC 7.5 11/07/2023   HGB 8.5 (L) 11/07/2023   HCT 26.4 (L) 11/07/2023   MCV 89.8 11/07/2023   PLT 196 11/07/2023    BMET    Component Value Date/Time   NA 137 11/07/2023 0425   K 4.0 11/07/2023  0425   CL 106 11/07/2023 0425   CO2 24 11/07/2023 0425   GLUCOSE 168 (H) 11/07/2023 0425   GLUCOSE 402 (H) 09/17/2012 1417   BUN 18 11/07/2023 0425   CREATININE 1.26 (H) 11/07/2023 0425   CALCIUM 8.5 (L) 11/07/2023 0425   GFRNONAA 60 (L) 11/07/2023 0425   GFRAA 58 (L) 06/02/2015 0656   CrCl cannot be calculated (Unknown ideal weight.).  COAG Lab Results  Component Value Date   INR 1.3 (H) 05/10/2023   INR 1.6 (H) 02/27/2023   INR 1.6 (H) 02/26/2023    Radiology DG Chest Port 1 View Result Date: 11/07/2023 CLINICAL DATA:  [id number] S/P PICC central line placement [id number] EXAM: PORTABLE CHEST - 1 VIEW COMPARISON:  October 08, 2023 FINDINGS: Right PICC has been placed in the interim, terminating in the lower SVC. No focal airspace consolidation, pleural effusion, or pneumothorax. No cardiomegaly. Sternotomy wires and CABG markers. Tortuous aorta with aortic atherosclerosis. No acute fracture or destructive lesions. Multilevel thoracic osteophytosis. IMPRESSION: Right PICC terminates in the lower SVC. Electronically Signed   By: Rogelia Myers M.D.   On: 11/07/2023 14:10   US  EKG SITE RITE Result Date: 11/07/2023 If Site Rite image not attached, placement could not be confirmed due to current cardiac rhythm.  PERIPHERAL VASCULAR CATHETERIZATION Result Date: 11/05/2023 See surgical note for result.    Assessment/Plan 1. 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  The patient does not voice lifestyle limiting changes at this point in time.  Noninvasive studies do not suggest clinically significant change.  No invasive studies, angiography or surgery at this time The patient should continue walking and begin a more formal exercise program.  The patient should continue antiplatelet therapy and aggressive treatment of the lipid abnormalities  No changes in the patient's medications at this time  Continued surveillance is indicated as atherosclerosis is likely to progress with time.    The patient will continue follow up with noninvasive studies as ordered.   2. S/P BKA (below knee amputation) unilateral, left (HCC) Currently his prosthesis is fitting well no changes at this time  3. Benign essential hypertension Continue antihypertensive medications as already ordered, these medications have been reviewed and there are no changes at this time.  4. 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  Continue statin as ordered and reviewed, no changes at this time  Nitrates PRN for chest pain  5. Longstanding persistent atrial fibrillation (HCC) Continue antiarrhythmia medications as already ordered, these medications have been reviewed and there are no changes at this time.  Continue anticoagulation as ordered by Cardiology Service    Cordella Shawl, MD  11/25/2023 4:32 PM

## 2023-11-26 ENCOUNTER — Other Ambulatory Visit (INDEPENDENT_AMBULATORY_CARE_PROVIDER_SITE_OTHER): Payer: Self-pay | Admitting: Nurse Practitioner

## 2023-11-26 ENCOUNTER — Other Ambulatory Visit (INDEPENDENT_AMBULATORY_CARE_PROVIDER_SITE_OTHER): Payer: Self-pay | Admitting: Vascular Surgery

## 2023-11-26 ENCOUNTER — Encounter (INDEPENDENT_AMBULATORY_CARE_PROVIDER_SITE_OTHER): Payer: Self-pay | Admitting: Vascular Surgery

## 2023-11-26 ENCOUNTER — Ambulatory Visit (INDEPENDENT_AMBULATORY_CARE_PROVIDER_SITE_OTHER): Admitting: Vascular Surgery

## 2023-11-26 VITALS — BP 129/79 | HR 89 | Resp 18 | Wt 191.0 lb

## 2023-11-26 DIAGNOSIS — I10 Essential (primary) hypertension: Secondary | ICD-10-CM | POA: Diagnosis not present

## 2023-11-26 DIAGNOSIS — I4811 Longstanding persistent atrial fibrillation: Secondary | ICD-10-CM

## 2023-11-26 DIAGNOSIS — I739 Peripheral vascular disease, unspecified: Secondary | ICD-10-CM

## 2023-11-26 DIAGNOSIS — Z89512 Acquired absence of left leg below knee: Secondary | ICD-10-CM

## 2023-11-26 DIAGNOSIS — I70219 Atherosclerosis of native arteries of extremities with intermittent claudication, unspecified extremity: Secondary | ICD-10-CM | POA: Diagnosis not present

## 2023-11-26 DIAGNOSIS — I25119 Atherosclerotic heart disease of native coronary artery with unspecified angina pectoris: Secondary | ICD-10-CM

## 2023-11-26 LAB — LAB REPORT - SCANNED: EGFR: 56

## 2023-11-27 ENCOUNTER — Other Ambulatory Visit (INDEPENDENT_AMBULATORY_CARE_PROVIDER_SITE_OTHER)

## 2023-11-27 DIAGNOSIS — Z9889 Other specified postprocedural states: Secondary | ICD-10-CM

## 2023-11-27 DIAGNOSIS — I739 Peripheral vascular disease, unspecified: Secondary | ICD-10-CM

## 2023-11-28 LAB — VAS US ABI WITH/WO TBI

## 2023-11-29 ENCOUNTER — Ambulatory Visit: Attending: Infectious Diseases | Admitting: Infectious Diseases

## 2023-11-29 ENCOUNTER — Telehealth: Payer: Self-pay

## 2023-11-29 VITALS — BP 108/73 | HR 91 | Temp 98.1°F

## 2023-11-29 DIAGNOSIS — Z794 Long term (current) use of insulin: Secondary | ICD-10-CM | POA: Diagnosis not present

## 2023-11-29 DIAGNOSIS — B9561 Methicillin susceptible Staphylococcus aureus infection as the cause of diseases classified elsewhere: Secondary | ICD-10-CM

## 2023-11-29 DIAGNOSIS — Z89512 Acquired absence of left leg below knee: Secondary | ICD-10-CM | POA: Diagnosis not present

## 2023-11-29 DIAGNOSIS — I2510 Atherosclerotic heart disease of native coronary artery without angina pectoris: Secondary | ICD-10-CM | POA: Insufficient documentation

## 2023-11-29 DIAGNOSIS — I739 Peripheral vascular disease, unspecified: Secondary | ICD-10-CM | POA: Diagnosis not present

## 2023-11-29 DIAGNOSIS — I4891 Unspecified atrial fibrillation: Secondary | ICD-10-CM | POA: Diagnosis not present

## 2023-11-29 DIAGNOSIS — E11628 Type 2 diabetes mellitus with other skin complications: Secondary | ICD-10-CM | POA: Diagnosis present

## 2023-11-29 DIAGNOSIS — Z89421 Acquired absence of other right toe(s): Secondary | ICD-10-CM

## 2023-11-29 DIAGNOSIS — Z7901 Long term (current) use of anticoagulants: Secondary | ICD-10-CM | POA: Diagnosis not present

## 2023-11-29 DIAGNOSIS — Z87891 Personal history of nicotine dependence: Secondary | ICD-10-CM | POA: Insufficient documentation

## 2023-11-29 DIAGNOSIS — Z951 Presence of aortocoronary bypass graft: Secondary | ICD-10-CM | POA: Diagnosis not present

## 2023-11-29 DIAGNOSIS — A4902 Methicillin resistant Staphylococcus aureus infection, unspecified site: Secondary | ICD-10-CM

## 2023-11-29 DIAGNOSIS — N189 Chronic kidney disease, unspecified: Secondary | ICD-10-CM | POA: Diagnosis not present

## 2023-11-29 DIAGNOSIS — Z7985 Long-term (current) use of injectable non-insulin antidiabetic drugs: Secondary | ICD-10-CM | POA: Diagnosis not present

## 2023-11-29 DIAGNOSIS — E1151 Type 2 diabetes mellitus with diabetic peripheral angiopathy without gangrene: Secondary | ICD-10-CM | POA: Diagnosis not present

## 2023-11-29 DIAGNOSIS — E11621 Type 2 diabetes mellitus with foot ulcer: Secondary | ICD-10-CM | POA: Insufficient documentation

## 2023-11-29 DIAGNOSIS — L089 Local infection of the skin and subcutaneous tissue, unspecified: Secondary | ICD-10-CM | POA: Insufficient documentation

## 2023-11-29 DIAGNOSIS — E1122 Type 2 diabetes mellitus with diabetic chronic kidney disease: Secondary | ICD-10-CM | POA: Diagnosis not present

## 2023-11-29 DIAGNOSIS — B952 Enterococcus as the cause of diseases classified elsewhere: Secondary | ICD-10-CM

## 2023-11-29 DIAGNOSIS — Z89411 Acquired absence of right great toe: Secondary | ICD-10-CM

## 2023-11-29 DIAGNOSIS — I129 Hypertensive chronic kidney disease with stage 1 through stage 4 chronic kidney disease, or unspecified chronic kidney disease: Secondary | ICD-10-CM | POA: Diagnosis not present

## 2023-11-29 NOTE — Telephone Encounter (Addendum)
 I spoke to the nurse [facility] with [facility] and gave orders to extend IV abx until 12/14/23 per Dr. Fayette.  Orders also sent back with the patient. I also requested labs be faxed to our office. Jovanie Verge ONEIDA Ligas, CMA

## 2023-11-29 NOTE — Progress Notes (Signed)
 NAME: Andrew Harding  DOB: 1950-08-24  MRN: [id number]  Date/Time: 11/29/2023 11:18 AM   Subjective:  Follow up visit for  ? Andrew Harding is a 73 y.o. with a history of diabetes mellitus, peripheral neuropathy, PAD left BKA ,CAD status post CABG, hypertension,  A-fib on Eliquis , right great toe amputation Presented to [facility] on 7/7  with nonhealing wound on right great toe at the site of the prior amputation Patient  in the month of May 2025   underwent excision first metatarsal right foot along with excision of the sesamoid  right first MTP joint by Dr. Ashley..  Culture then was MRSA.  Patient placed on  Bactrim   Patient at that time also underwent angio and had angioplasty and stent placement to the right anterior tibial artery, percutaneous transluminal angioplasty of the right mid popliteal artery and the right tibioperoneal trunk.. As the wound progressed he underwent TMA on 11/02/23 Pathology acute osteo but margin clear of osteo- culture MRSA/ enterococcus fecalis - decided to treat with 4 weeks of IV dapto in order to give the rt foot a chance to heel completely and to prevent further amputation I last saw 10 days ago and there was superficial ischemic necrosis of the skin He saw poditatrist and vascular after that. 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) 09/03/2023   Atrial fibrillation with RVR (HCC) 08/19/2022   Bladder neck obstruction    Cellulitis 08/17/2022   Chronic kidney disease    Coronary artery disease    a.) s/p 4v CABG in 2014   Diabetes mellitus without complication (HCC)    Diabetic  neuropathy (HCC)    Diabetic peripheral neuropathy (HCC)    Diabetic ulcer of toe of left foot associated with diabetes mellitus of other type, limited to breakdown of skin (HCC) 12/21/2017   Diverticulosis    Gout    Gram-negative bacteremia 05/11/2023   Heart murmur    History of osteomyelitis 05/31/2015   Hypercholesteremia    Hyperlipidemia    Hypertension    Hypotension due to hypovolemia 08/17/2022   Infection of left foot 08/19/2022   MSSA bacteremia 02/28/2023   Open wound of left foot with complication 05/10/2023   Osteomyelitis (HCC) 05/31/2015   Peripheral neuropathy    Postural dizziness with presyncope 02/26/2023   S/P BKA (below knee amputation) unilateral, 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   S/P transmetatarsal amputation of foot, left (HCC) 08/29/2022   Sepsis (HCC) 08/17/2022   Sepsis (HCC) 05/10/2023   Status post amputation of toe of right foot (HCC) 11/01/2015   Tubular adenoma    Vitamin D  deficiency     Past Surgical History:  Procedure Laterality Date   ABDOMINAL AORTOGRAM W/LOWER EXTREMITY N/A 11/05/2023   Procedure: ABDOMINAL AORTOGRAM W/LOWER EXTREMITY;  Surgeon: Marea Selinda RAMAN, MD;  Location: [facility];  Service: Cardiovascular;  Laterality: N/A;   ACHILLES TENDON SURGERY Right 09/14/2023   Procedure: TENOTOMY, ACHILLES;  Surgeon: [name], DPM;  Location: [facility];  Service: Orthopedics/Podiatry;  Laterality: Right;  AMPUTATION Left 08/19/2022   Procedure: TRANSMETATARSAL AMPUTATION LEFT FOOT WITH IRRIGATION AND DEBRIDEMENT;  Surgeon: Lennie Barter, DPM;  Location: [facility];  Service: Podiatry;  Laterality: Left;   AMPUTATION Left 05/16/2023   Procedure: AMPUTATION BELOW KNEE;  Surgeon: Marea Selinda RAMAN, MD;  Location: [facility];  Service: General;  Laterality: Left;   AMPUTATION Right 09/14/2023   Procedure: AMPUTATION, FOOT, RAY;  Surgeon: [name], DPM;  Location: [facility];  Service: Orthopedics/Podiatry;  Laterality: Right;    AMPUTATION TOE Right 06/01/2015   Procedure: AMPUTATION TOE;  Surgeon: Donnice Cory, DPM;  Location: [facility];  Service: Podiatry;  Laterality: Right;   AMPUTATION TOE Left 08/11/2022   Procedure: AMPUTATION TOE 2, 3, 4;  Surgeon: [name], DPM;  Location: [facility];  Service: Podiatry;  Laterality: Left;   CATARACT EXTRACTION, BILATERAL     CIRCUMCISION N/A 06/12/2022   Procedure: CIRCUMCISION ADULT;  Surgeon: Penne Knee, MD;  Location: [facility];  Service: Urology;  Laterality: N/A;   COLONOSCOPY WITH PROPOFOL  N/A 02/14/2016   Procedure: COLONOSCOPY WITH PROPOFOL ;  Surgeon: Gladis RAYMOND Mariner, MD;  Location: [facility]OS[facility];  Service: Endoscopy;  Laterality: N/A;   COLONOSCOPY WITH PROPOFOL  N/A 01/07/2019   Procedure: COLONOSCOPY WITH PROPOFOL ;  Surgeon: Mariner Gladis RAYMOND, MD;  Location: [facility] ENDOSCOPY;  Service: Endoscopy;  Laterality: N/A;   CORONARY ARTERY BYPASS GRAFT N/A 08/2012   EXCISION PARTIAL PHALANX Right 06/01/2015   Procedure: EXCISION PARTIAL PHALANX /  BONE;  Surgeon: Donnice Cory, DPM;  Location: [facility];  Service: Podiatry;  Laterality: Right;   FLEXIBLE SIGMOIDOSCOPY N/A 05/29/2016   Procedure: FLEXIBLE SIGMOIDOSCOPY;  Surgeon: Gladis RAYMOND Mariner, MD;  Location: [facility];  Service: Endoscopy;  Laterality: N/A;   INCISION AND DRAINAGE Left 08/23/2022   Procedure: INCISION AND DRAINAGE;  Surgeon: [name], DPM;  Location: [facility];  Service: Podiatry;  Laterality: Left;   INCISION AND DRAINAGE OF WOUND Left 09/15/2022   Procedure: 11044 - DEBRIDE BONE and EXCISION IF 1ST METATARSAL BONE WITH  DELAY PRIMARY CLOSURE;  Surgeon: [name], DPM;  Location: [facility];  Service: Podiatry;  Laterality: Left;   IRRIGATION AND DEBRIDEMENT FOOT Left 02/28/2023   Procedure: IRRIGATION AND DEBRIDEMENT FOOT;  Surgeon: [name], DPM;  Location: [facility];  Service: Orthopedics/Podiatry;  Laterality: Left;   KNEE ARTHROSCOPY Left    LOWER EXTREMITY  ANGIOGRAPHY Left 08/22/2022   Procedure: Lower Extremity Angiography;  Surgeon: Jama Cordella MATSU, MD;  Location: [facility];  Service: Cardiovascular;  Laterality: Left;   LOWER EXTREMITY ANGIOGRAPHY Left 05/11/2023   Procedure: Lower Extremity Angiography;  Surgeon: Marea Selinda RAMAN, MD;  Location: [facility];  Service: Cardiovascular;  Laterality: Left;   LOWER EXTREMITY ANGIOGRAPHY Right 09/11/2023   Procedure: Lower Extremity Angiography;  Surgeon: Jama Cordella MATSU, MD;  Location: [facility];  Service: Cardiovascular;  Laterality: Right;   LOWER EXTREMITY INTERVENTION Right 09/11/2023   Procedure: LOWER EXTREMITY INTERVENTION;  Surgeon: Jama Cordella MATSU, MD;  Location: [facility];  Service: Cardiovascular;  Laterality: Right;   TEE WITHOUT CARDIOVERSION N/A 03/01/2023   Procedure: TRANSESOPHAGEAL ECHOCARDIOGRAM;  Surgeon: Alluri, Keller BROCKS, MD;  Location: [facility];  Service: Cardiovascular;  Laterality: N/A;   TRANSMETATARSAL AMPUTATION Right 11/02/2023   Procedure: AMPUTATION, FOOT, TRANSMETATARSAL;  Surgeon: [name], DPM;  Location: [facility];  Service: Orthopedics/Podiatry;  Laterality: Right;   WOUND DEBRIDEMENT Left 09/15/2022   Procedure: 11043 - DEBRIDE SKIN. MUSCLE FASCIA;  Surgeon: [name], DPM;  Location: [facility];  Service: Podiatry;  Laterality: Left;    Social History   Socioeconomic History   Marital status: Divorced    Spouse name: Ashley,Angela C   Number of children: Not on file   Years of education: Not on file   Highest education level: Not on file  Occupational History   Not on file  Tobacco Use   Smoking status: Former    Types: Cigars   Smokeless tobacco: Never  Vaping Use   Vaping status: Never Used  Substance and Sexual Activity   Alcohol use: No   Drug use: No   Sexual activity: Never  Other Topics Concern   Not on file  Social History Narrative   Patient is legally separated and lives at home by  himself. His ex -wife is his HCPOA. Neighbors and friends are his support system. He used to work for [employer]    Social Drivers of Corporate investment banker Strain: Low Risk  (12/13/2022)   Received from [facility]   Overall Financial Resource Strain (CARDIA)    Difficulty of Paying Living Expenses: Not hard at all  Food Insecurity: No Food Insecurity (11/01/2023)   Hunger Vital Sign    Worried About Running Out of Food in the Last Year: Never true    Ran Out of Food in the Last Year: Never true  Transportation Needs: No Transportation Needs (11/01/2023)   PRAPARE - Administrator, Civil Service (Medical): No    Lack of Transportation (Non-Medical): No  Physical Activity: Not on file  Stress: Not on file  Social Connections: Moderately Isolated (11/01/2023)   Social Connection and Isolation Panel    Frequency of Communication with Friends and Family: More than three times a week    Frequency of Social Gatherings with Friends and Family: Twice a week    Attends Religious Services: More than 4 times per year    Active Member of [employer] or Organizations: No    Attends Banker Meetings: Never    Marital Status: Divorced  Catering manager Violence: Not At Risk (11/01/2023)   Humiliation, Afraid, Rape, and Kick questionnaire    Fear of Current or Ex-Partner: No    Emotionally Abused: No    Physically Abused: No    Sexually Abused: No    Family History  Problem Relation Age of Onset   Diabetes Mellitus II Mother    CAD Mother    No Known Allergies I? Current Outpatient Medications  Medication Sig Dispense Refill   chlorhexidine  (HIBICLENS ) 4 % external liquid Apply 15 mLs (1 Application total) topically as directed for 30 doses. Use as directed daily for 5 days every other week for 6 weeks. 946 mL 1   clopidogrel  (PLAVIX ) 75 MG tablet TAKE 1 TABLET BY MOUTH EVERY DAY WITH BREAKFAST 30 tablet 5   Continuous Glucose Sensor  (FREESTYLE LIBRE 2 SENSOR) MISC      cyanocobalamin  (VITAMIN B12) 1000 MCG tablet Take 1 tablet (1,000 mcg total) by mouth daily.     daptomycin  (CUBICIN ) IVPB Inject 700 mg into the vein daily for 22 days. 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 100 capsule 0   ELIQUIS  5 MG TABS tablet TAKE 1 TABLET(5 MG) BY MOUTH TWICE DAILY 60 tablet 2   gabapentin  (NEURONTIN ) 300 MG capsule Take 1 capsule (300 mg total) by mouth 3 (three) times daily. 90 capsule 0   glucose blood (ONETOUCH ULTRA) test strip Use 3 (three) times daily     insulin  glargine (LANTUS ) 100 UNIT/ML Solostar Pen Inject 28 Units into the skin at bedtime.     Insulin  Pen Needle (FIFTY50 PEN NEEDLES) 32G X 4 MM MISC USE 4 TIMES DAILY     iron  polysaccharides (NIFEREX) 150 MG capsule Take 1 capsule (150 mg total) by mouth daily.     metoprolol  tartrate (LOPRESSOR ) 25 MG tablet Take 1 tablet (25 mg total) by mouth 2 (two) times daily. 60 tablet 0   oxyCODONE  (OXY IR/ROXICODONE ) 5 MG immediate release tablet Take 1 tablet (5 mg total) by mouth every 6 (six) hours as needed for moderate pain (pain score 4-6). SNF use only.  Refills per SNF providers 12 tablet 0   OZEMPIC, 0.25 OR 0.5 MG/DOSE, 2 MG/3ML SOPN Inject 0.75 mLs into the skin once a week.     traZODone  (DESYREL ) 50 MG tablet Take 50 mg by mouth at bedtime.     No current facility-administered medications for this visit.     Abtx:  Anti-infectives (From admission,  onward)    None       REVIEW OF SYSTEMS:  Const: negative fever, negative chills, negative weight loss Eyes: negative diplopia or visual changes, negative eye pain ENT: negative coryza, negative sore throat Resp: negative cough, hemoptysis, dyspnea Cards: negative for chest pain, palpitations, lower extremity edema GU: negative for frequency, dysuria and hematuria GI: Negative for abdominal pain, diarrhea, bleeding, constipation Skin: negative for rash and pruritus Heme: negative for easy bruising and gum/nose bleeding MS: as above Neurolo:negative for headaches, dizziness, vertigo, memory problems  Psych: negative for feelings of anxiety, depression  Endocrine: , diabetes Allergy/Immunology- negative for any medication or food allergies ?  Objective:  VITALS:  BP 108/73   Pulse 91   Temp 98.1 F (36.7 C) (Temporal)   SpO2 97%  LDA Rt PICC PHYSICAL EXAM:  General: Alert, cooperative, no distress, appears stated age. In wheel chair Head: Normocephalic, without obvious abnormality, atraumatic. Eyes: Conjunctivae clear, anicteric sclerae. Pupils are equal ENT Nares normal. No drainage or sinus tenderness. Lips, mucosa, and tongue normal. No Thrush Neck: Supple, symmetrical, no adenopathy, thyroid: non tender no carotid bruit and no JVD. Back: No CVA tenderness. Lungs: Clear to auscultation bilaterally. No Wheezing or Rhonchi. No rales. Heart: Regular rate and rhythm, no murmur, rub or gallop. Abdomen: Soft, non-tender,not distended. Bowel sounds normal. No masses Extremities: rt foot TMA site- has superficial ischemia /necrosis at the surgical site   11/29/23         11/20/23          Left leg BKA Prosthetic leg Skin: No rashes or lesions. Or bruising Lymph: Cervical, supraclavicular normal. Neurologic: Grossly non-focal Pertinent Labs Lab Results CBC    Component Value Date/Time   WBC 7.5 11/07/2023 0425   RBC 2.94 (L) 11/07/2023 0425   HGB  8.5 (L) 11/07/2023 0425   HCT 26.4 (L) 11/07/2023 0425  PLT 196 11/07/2023 0425   MCV 89.8 11/07/2023 0425   MCH 28.9 11/07/2023 0425   MCHC 32.2 11/07/2023 0425   RDW 14.7 11/07/2023 0425   LYMPHSABS 0.9 11/07/2023 0425   MONOABS 0.6 11/07/2023 0425   EOSABS 0.1 11/07/2023 0425   BASOSABS 0.0 11/07/2023 0425       Latest Ref Rng & Units 11/07/2023    4:25 AM 11/06/2023    5:37 AM 11/05/2023    4:30 AM  CMP  Glucose 70 - 99 mg/dL 831  851  855   BUN 8 - 23 mg/dL 18  19  20    Creatinine 0.61 - 1.24 mg/dL 8.73  8.73  8.76   Sodium 135 - 145 mmol/L 137  137  136   Potassium 3.5 - 5.1 mmol/L 4.0  4.6  4.1   Chloride 98 - 111 mmol/L 106  107  105   CO2 22 - 32 mmol/L 24  25  24    Calcium 8.9 - 10.3 mg/dL 8.5  8.5  8.6      ? Impression/Recommendation ?Diabetic foot infection with peripheral artery disease involving the right foot at the site of prior amputation of the great toe Underwent TMA on 11/02/2023 Culture MRSA / enterococcus fecalis Bone pathology acute osteo but margin clear. [facility]  Some ischemic necrosis of the skin at the surgical site improving Has 2 pressure wounds from the cam boot Saw podiatrist last week and has an appt today Saw vascular Will Continue dapto  for 2 more weeks ( 8/15) and may do linezolid after that   History of left BKA    CAD Status post CABG   A-fib on  Eliquis    ?  ________________________________________________ Discussed with patient, and his friend- informed [facility] up 2 weeks

## 2023-11-29 NOTE — Patient Instructions (Signed)
 ou came in today for a follow-up on your foot infection. The infection is showing signs of improvement with less drainage and some of the blackened areas falling off naturally. You are currently on antibiotics, which are due to finish tomorrow. You have been wearing a boot to protect your foot and keeping it elevated. You are concerned about the presence of MRSA and the level of care at your current facility. You are eager to return to work but understand the need to stay off your feet for a few more weeks.  YOUR PLAN:  -FOOT ULCER WITH SECONDARY INFECTION DUE TO METHICILLIN-RESISTANT STAPHYLOCOCCUS AUREUS (MRSA): A foot ulcer is an open sore on the foot, and in your case, it has a secondary infection caused by a type of bacteria called MRSA. The infection is improving, with less drainage and the blackened areas starting to fall off. You will need to extend your IV antibiotics for two more weeks. We will discuss with Doctor Ashley about possibly removing the boot at night to reduce pressure points. Continue to keep your foot elevated and avoid putting weight on it. We will monitor the healing process and transition you to oral antibiotics after completing the IV therapy.  INSTRUCTIONS:  Extend IV antibiotics for two more weeks. Discuss with Doctor Ashley about removing the boot at night. Keep your foot elevated and avoid weight-bearing. Monitor for signs of healing and closure of the ulcer. Transition to oral antibiotics after completing IV therapy.

## 2023-12-01 ENCOUNTER — Encounter (INDEPENDENT_AMBULATORY_CARE_PROVIDER_SITE_OTHER): Payer: Self-pay | Admitting: Vascular Surgery

## 2023-12-03 LAB — LAB REPORT - SCANNED: EGFR: 58

## 2023-12-06 NOTE — Progress Notes (Signed)
 Chief Complaint:   Chief Complaint  Patient presents with  . Follow-up    Right foot    Subjective  HPI  Andrew Harding is a 73 y.o. male who presents for Follow-up (Right foot) History of Present Illness Andrew Harding is a 73 year old male who presents with a non-healing wound and staple removal.  Status post transmetatarsal amputation . for came to close on this.  Unfortunately he is also developed pressure ulcers medial lateral ankle posterior heel and lateral fifth metatarsal.  He has a non-healing wound with drainage that has been stable but not fully healed. Some areas of the wound have necrotic tissue, and he uses pads to manage the drainage. The skin has peeled off, which he feels has improved the condition, although it is not completely healed.  He uses a boot to offload pressure from the wound but removes it when sitting to prevent further pressure sores. He is concerned about pressure sores on thin areas of skin, which take a long time to heal. He uses a walker and is cautious about how he steps to avoid putting pressure on the wound.  He is currently on antibiotics, which he believes will run out next Friday. He is unsure if they are effective but understands they are for infection prevention. His labs are being monitored, although he has not been informed of the results recently.  He wants to return to work and is hopeful that the wound will heal without further surgical intervention. He is concerned about the nutritional quality of his meals at his current residence and notes inconsistency in the timing of his medication administration. He receives daptomycin  once a day, which runs for thirty minutes.  He is cautious about the boot causing further issues and is looking for alternatives to prevent additional ulcers.  Review of Systems  Patient Active Problem List  Diagnosis  . Coronary artery disease  . Mixed hyperlipidemia  . Gout  . Vitamin D  deficiency  . Benign essential  hypertension  . CKD (chronic kidney disease) stage 3, GFR 30-59 ml/min (CMS/HHS-HCC)  . Status post amputation of toe of right foot (CMS/HHS-HCC)  . Diabetic polyneuropathy associated with type 2 diabetes mellitus (CMS/HHS-HCC)  . Type 2 diabetes mellitus with stage 3 chronic kidney disease, with long-term current use of insulin  (CMS/HHS-HCC)  . Type 2 diabetes mellitus with vascular disease (CMS/HHS-HCC)  . Uncontrolled type 2 diabetes mellitus with hyperglycemia, with long-term current use of insulin  (CMS/HHS-HCC)  . Paroxysmal atrial fibrillation (CMS/HHS-HCC)  . Postural dizziness with presyncope  . S/P BKA (below knee amputation) unilateral, left (CMS/HHS-HCC)  . Atherosclerosis of native arteries of extremity with intermittent claudication ()  . Atherosclerosis of native arteries of the extremities with ulceration (CMS/HHS-HCC)  . Diabetic foot infection  (CMS/HHS-HCC)  . B12 deficiency anemia  . Absence of lower extremity (CMS/HHS-HCC)  . Acquired absence of organ  . Atherosclerosis of coronary artery without angina pectoris  . Difficulty walking  . Dysphagia  . Methicillin susceptible Staphylococcus aureus infection  . Muscle weakness    Outpatient Medications Prior to Visit  Medication Sig Dispense Refill  . apixaban  (ELIQUIS ) 5 mg tablet Take 5 mg by mouth every 12 (twelve) hours    . blood-glucose sensor (FREESTYLE LIBRE 2 PLUS SENSOR) Use 1 each every 15 (fifteen) days 6 each 1  . chlorhexidine  (HIBICLENS ) 4 % external wash Apply 1 Application topically    . clopidogreL  (PLAVIX ) 75 mg tablet Take 75 mg by mouth once daily    .  cyanocobalamin  (VITAMIN B12) 1000 MCG tablet Take 1,000 mcg by mouth once daily    . flash glucose sensor (FREESTYLE LIBRE 2 SENSOR) Kit USE 1 SENSOR EVERY 14 DAYS FOR GLUCOSE MONITORING AS DIRECTED    . gabapentin  (NEURONTIN ) 300 MG capsule Take 1 capsule (300 mg total) by mouth 3 (three) times daily 270 capsule 3  . insulin  GLARGINE (LANTUS   SOLOSTAR U-100 INSULIN ) pen injector (concentration 100 units/mL) Inject 36 Units subcutaneously at bedtime 45 mL 3  . iron  polysaccharides (FERREX) 150 mg iron  capsule Take 150 mg by mouth once daily    . lancets (ONETOUCH DELICA PLUS LANCET) Use 1 each 3 (three) times daily 300 each 3  . metFORMIN (GLUCOPHAGE-XR) 500 MG XR tablet Take 4 tablets (2,000 mg total) by mouth daily with dinner 360 tablet 3  . metoprolol  TARTrate (LOPRESSOR ) 25 MG tablet Take 1 tablet (25 mg total) by mouth 2 (two) times daily 180 tablet 3  . oxyCODONE  (ROXICODONE ) 5 MG immediate release tablet Take 5 mg by mouth    . oxyCODONE -acetaminophen  (PERCOCET) 5-325 mg tablet Take 1-2 tablets by mouth    . pen needle, diabetic (BD ULTRA-FINE NANO PEN NEEDLE) 32 gauge x 5/32 Ndle 1 each once daily 100 each 1  . pravastatin  (PRAVACHOL ) 80 MG tablet Take 1 tablet (80 mg total) by mouth every evening 90 tablet 3  . semaglutide (OZEMPIC) 1 mg/dose (4 mg/3 mL) pen injector Inject 0.75 mLs (1 mg total) subcutaneously once a week 9 mL 3  . traZODone  (DESYREL ) 50 MG tablet Take 1 tablet (50 mg total) by mouth at bedtime as needed 90 tablet 3   No facility-administered medications prior to visit.      Objective  Vitals:   12/06/23 1344  BP: 135/74  Weight: 82.6 kg (182 lb)  Height: 190.5 cm (6' 3)  PainSc: 0-No pain   Body mass index is 22.75 kg/m.  Home Vitals:     Physical Exam SKIN: Drainage and fluid present on skin. Skin peeling improved. Improvement in skin condition. Skin condition appears good. Pressure spots due to fragile skin and circulation issues.            The following labs were personally reviewed: - Lab Results  Component Value Date   WBC 6.1 06/19/2023   HGB 12.1 (L) 06/19/2023   HCT 37.5 (L) 06/19/2023   PLT 207 06/19/2023   - Lab Results  Component Value Date   NA 142 06/19/2023   K 4.4 06/19/2023   CL 110 (H) 06/19/2023   CO2 17.0 (L) 06/19/2023   BUN 15 06/19/2023    CREATININE 1.1 06/19/2023   GLUCOSE 117 (H) 06/19/2023   - Lab Results  Component Value Date   NA 142 06/19/2023   K 4.4 06/19/2023   CL 110 (H) 06/19/2023   CO2 17.0 (L) 06/19/2023   BUN 15 06/19/2023   CREATININE 1.1 06/19/2023   CALCIUM 8.8 06/19/2023   TP 6.5 09/17/2012   ALB 4.2 06/19/2023   TBILI 0.4 06/19/2023   ALKPHOS 53 06/19/2023   AST 19 06/19/2023   ALT 14 06/19/2023   GLUCOSE 117 (H) 06/19/2023   GFR 71 06/19/2023   - Lab Results  Component Value Date   HGBA1C 6.8 (H) 06/19/2023      Results Procedure: Staple removal Description: Staples removed from wound with minimal exudate observed. Necrotic tissue noted to be stable.     Assessment/Plan:   Assessment & Plan Chronic non-healing right foot wound with  drainage Chronic non-healing wound on the right foot with drainage, not full thickness to the bone. Black tissue present, healing monitored. Antibiotics used to prevent infection, labs monitored for infection status. Nutritional support important for healing. Possible need for surgical intervention if not healing from inside out. Post-op shoe used to accommodate bandaging and prevent pressure. - Remove some staples, leave two staples and a stitch in place. - Apply Steri-Strips to the wound. - Continue antibiotics until next Friday. - Monitor labs for signs of infection. - Encourage nutritional support, including protein shakes if possible. - Use a post-op shoe to accommodate bandaging and prevent pressure on the wound. - Avoid putting weight on the front of the foot; weight can be placed on the heel. - Schedule follow-up in one week.  Pressure ulcer of right heel, medial lateral ankle and fifth metatarsal base Slow healing due to thin skin and circulation issues. Managed with foam padding to prevent further pressure and damage. - Apply foam padding to the heel and other pressure points. - Encourage keeping the boot off to prevent pressure on the  heel. - Monitor for any signs of worsening or deepening of the ulcer.  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   This note has been created using automated tools and reviewed for accuracy by JUSTIN ALLEN FOWLER.

## 2023-12-10 LAB — LAB REPORT - SCANNED: EGFR: 58

## 2023-12-13 ENCOUNTER — Encounter: Payer: Self-pay | Admitting: Infectious Diseases

## 2023-12-13 ENCOUNTER — Telehealth (INDEPENDENT_AMBULATORY_CARE_PROVIDER_SITE_OTHER): Payer: Self-pay | Admitting: Vascular Surgery

## 2023-12-13 ENCOUNTER — Ambulatory Visit: Attending: Infectious Diseases | Admitting: Infectious Diseases

## 2023-12-13 ENCOUNTER — Telehealth: Payer: Self-pay

## 2023-12-13 VITALS — BP 112/63 | HR 86 | Temp 97.3°F

## 2023-12-13 DIAGNOSIS — B9561 Methicillin susceptible Staphylococcus aureus infection as the cause of diseases classified elsewhere: Secondary | ICD-10-CM

## 2023-12-13 DIAGNOSIS — N189 Chronic kidney disease, unspecified: Secondary | ICD-10-CM | POA: Diagnosis not present

## 2023-12-13 DIAGNOSIS — Z955 Presence of coronary angioplasty implant and graft: Secondary | ICD-10-CM | POA: Diagnosis not present

## 2023-12-13 DIAGNOSIS — E1122 Type 2 diabetes mellitus with diabetic chronic kidney disease: Secondary | ICD-10-CM | POA: Insufficient documentation

## 2023-12-13 DIAGNOSIS — I2510 Atherosclerotic heart disease of native coronary artery without angina pectoris: Secondary | ICD-10-CM | POA: Insufficient documentation

## 2023-12-13 DIAGNOSIS — E11628 Type 2 diabetes mellitus with other skin complications: Secondary | ICD-10-CM

## 2023-12-13 DIAGNOSIS — Z87891 Personal history of nicotine dependence: Secondary | ICD-10-CM | POA: Insufficient documentation

## 2023-12-13 DIAGNOSIS — Z7901 Long term (current) use of anticoagulants: Secondary | ICD-10-CM | POA: Insufficient documentation

## 2023-12-13 DIAGNOSIS — X58XXXA Exposure to other specified factors, initial encounter: Secondary | ICD-10-CM | POA: Insufficient documentation

## 2023-12-13 DIAGNOSIS — Z89512 Acquired absence of left leg below knee: Secondary | ICD-10-CM | POA: Insufficient documentation

## 2023-12-13 DIAGNOSIS — I4891 Unspecified atrial fibrillation: Secondary | ICD-10-CM | POA: Diagnosis not present

## 2023-12-13 DIAGNOSIS — B952 Enterococcus as the cause of diseases classified elsewhere: Secondary | ICD-10-CM

## 2023-12-13 DIAGNOSIS — T8149XA Infection following a procedure, other surgical site, initial encounter: Secondary | ICD-10-CM | POA: Insufficient documentation

## 2023-12-13 DIAGNOSIS — Z89411 Acquired absence of right great toe: Secondary | ICD-10-CM | POA: Diagnosis not present

## 2023-12-13 DIAGNOSIS — I129 Hypertensive chronic kidney disease with stage 1 through stage 4 chronic kidney disease, or unspecified chronic kidney disease: Secondary | ICD-10-CM | POA: Insufficient documentation

## 2023-12-13 DIAGNOSIS — Z794 Long term (current) use of insulin: Secondary | ICD-10-CM | POA: Diagnosis not present

## 2023-12-13 DIAGNOSIS — Z8614 Personal history of Methicillin resistant Staphylococcus aureus infection: Secondary | ICD-10-CM | POA: Insufficient documentation

## 2023-12-13 DIAGNOSIS — E1140 Type 2 diabetes mellitus with diabetic neuropathy, unspecified: Secondary | ICD-10-CM | POA: Diagnosis not present

## 2023-12-13 DIAGNOSIS — E1151 Type 2 diabetes mellitus with diabetic peripheral angiopathy without gangrene: Secondary | ICD-10-CM | POA: Insufficient documentation

## 2023-12-13 DIAGNOSIS — Z7985 Long-term (current) use of injectable non-insulin antidiabetic drugs: Secondary | ICD-10-CM

## 2023-12-13 DIAGNOSIS — L089 Local infection of the skin and subcutaneous tissue, unspecified: Secondary | ICD-10-CM | POA: Diagnosis not present

## 2023-12-13 NOTE — Telephone Encounter (Signed)
-----   Message from DONALD BERLIN sent at 12/13/2023  4:04 PM EDT ----- Hi, I spoke to his podiatrist- HE is planning a rpeeat surgery next Friday- he did some deep cultures today.We are continuing daptomycin  IV . Can you please let the nursing home know tocontinue Daptomycin  until further instructions- we will get back nec=xt week after we get the culture report an can adjust antibiotic as needed- thx( he is supposed to complete tomorrow)

## 2023-12-13 NOTE — Telephone Encounter (Signed)
 Studies show that his blood flow is intact.  If he has additional questions he should be seen

## 2023-12-13 NOTE — Telephone Encounter (Signed)
 Left a message on the patient voicemail to return call

## 2023-12-13 NOTE — Telephone Encounter (Signed)
 I spoke to [name] with [facility] and gave verbal orders for patient to continue IV abx for the next 7 days or until they hear back from our office. I have advised her we are waiting on culture results.  Andrew Harding, CMA

## 2023-12-13 NOTE — Telephone Encounter (Signed)
 Andrew Harding is calling asking if he can get his US  results. Offered to make pt appt, but prefers to get them over the phone if possible. Saw GS on 11/26/23 & had US 's on 11/27/23. Just wants to make sure his blood flow is ok. Please advise.

## 2023-12-13 NOTE — Patient Instructions (Signed)
 I saw you today for the rt foot infection- I will discuss with Dr.Fowler this afternoon and then plan on future management- will be in touch

## 2023-12-13 NOTE — Progress Notes (Signed)
 NAME: Andrew Harding  DOB: 06-Dec-1950  MRN: [id number]  Date/Time: 12/13/2023 11:52 AM   Subjective:  Follow up visit for right foot infection He is here with his friend He is on IV daptomycin .  No side effects from the medication. He is at [facility] He has an appointment with the podiatrist today  Following taken from visit note Andrew Harding is a 73 y.o. male with a history of diabetes mellitus, peripheral neuropathy, PAD left BKA ,CAD status post CABG, hypertension,  A-fib on Eliquis , right great toe amputation Presented to [facility] on 7/7  with nonhealing wound on right great toe at the site of the prior amputation Patient  in the month of May 2025   underwent excision first metatarsal right foot along with excision of the sesamoid  right first MTP joint by Dr. Ashley..  Culture then was MRSA.  Patient placed on  Bactrim   Patient at that time also underwent angio and had angioplasty and stent placement to the right anterior tibial artery, percutaneous transluminal angioplasty of the right mid popliteal artery and the right tibioperoneal trunk.. As the wound progressed he underwent TMA on 11/02/23 Pathology acute osteo but margin clear of osteo- culture MRSA/ enterococcus fecalis - decided to treat with 4 weeks of IV dapto which were extended by 2 more weeks to complete 6 weeks on 12/14/2023 The wound healing has been complicated by superficial necrosis of the surgical site and also some dehiscence He also had developed 2 pressure ulcers on the malleolus from the cam boot.  This has gotten better since he removed the cam boot.   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) 09/03/2023   Atrial fibrillation with RVR (HCC) 08/19/2022   Bladder neck obstruction    Cellulitis 08/17/2022   Chronic kidney disease    Coronary artery  disease    a.) s/p 4v CABG in 2014   Diabetes mellitus without complication (HCC)    Diabetic neuropathy (HCC)    Diabetic peripheral neuropathy (HCC)    Diabetic ulcer of toe of left foot associated with diabetes mellitus of other type, limited to breakdown of skin (HCC) 12/21/2017   Diverticulosis    Gout    Gram-negative bacteremia 05/11/2023   Heart murmur    History of osteomyelitis 05/31/2015   Hypercholesteremia    Hyperlipidemia    Hypertension    Hypotension due to hypovolemia 08/17/2022   Infection of left foot 08/19/2022   MSSA bacteremia 02/28/2023   Open wound of left foot with complication 05/10/2023   Osteomyelitis (HCC) 05/31/2015   Peripheral neuropathy    Postural dizziness with presyncope 02/26/2023   S/P BKA (below knee amputation) unilateral, 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   S/P transmetatarsal amputation of foot, left (HCC) 08/29/2022   Sepsis (HCC) 08/17/2022   Sepsis (HCC) 05/10/2023   Status post amputation of toe of right foot (HCC) 11/01/2015   Tubular adenoma    Vitamin D  deficiency     Past Surgical History:  Procedure Laterality Date   ABDOMINAL AORTOGRAM W/LOWER EXTREMITY N/A 11/05/2023   Procedure: ABDOMINAL AORTOGRAM W/LOWER EXTREMITY;  Surgeon: Marea Selinda RAMAN, MD;  Location: [facility];  Service: Cardiovascular;  Laterality: N/A;   ACHILLES TENDON SURGERY Right 09/14/2023   Procedure: TENOTOMY, ACHILLES;  Surgeon: [name],  DPM;  Location: [facility];  Service: Orthopedics/Podiatry;  Laterality: Right;   AMPUTATION Left 08/19/2022   Procedure: TRANSMETATARSAL AMPUTATION LEFT FOOT WITH IRRIGATION AND DEBRIDEMENT;  Surgeon: Lennie Barter, DPM;  Location: [facility];  Service: Podiatry;  Laterality: Left;   AMPUTATION Left 05/16/2023   Procedure: AMPUTATION BELOW KNEE;  Surgeon: Marea Selinda RAMAN, MD;  Location: [facility];  Service: General;  Laterality: Left;   AMPUTATION Right 09/14/2023   Procedure: AMPUTATION, FOOT, RAY;   Surgeon: [name], DPM;  Location: [facility];  Service: Orthopedics/Podiatry;  Laterality: Right;   AMPUTATION TOE Right 06/01/2015   Procedure: AMPUTATION TOE;  Surgeon: Donnice Cory, DPM;  Location: [facility];  Service: Podiatry;  Laterality: Right;   AMPUTATION TOE Left 08/11/2022   Procedure: AMPUTATION TOE 2, 3, 4;  Surgeon: [name], DPM;  Location: [facility];  Service: Podiatry;  Laterality: Left;   CATARACT EXTRACTION, BILATERAL     CIRCUMCISION N/A 06/12/2022   Procedure: CIRCUMCISION ADULT;  Surgeon: Penne Knee, MD;  Location: [facility];  Service: Urology;  Laterality: N/A;   COLONOSCOPY WITH PROPOFOL  N/A 02/14/2016   Procedure: COLONOSCOPY WITH PROPOFOL ;  Surgeon: Gladis RAYMOND Mariner, MD;  Location: [facility] ENDOSCOPY;  Service: Endoscopy;  Laterality: N/A;   COLONOSCOPY WITH PROPOFOL  N/A 01/07/2019   Procedure: COLONOSCOPY WITH PROPOFOL ;  Surgeon: Mariner Gladis RAYMOND, MD;  Location: [facility] ENDOSCOPY;  Service: Endoscopy;  Laterality: N/A;   CORONARY ARTERY BYPASS GRAFT N/A 08/2012   EXCISION PARTIAL PHALANX Right 06/01/2015   Procedure: EXCISION PARTIAL PHALANX /  BONE;  Surgeon: Donnice Cory, DPM;  Location: [facility];  Service: Podiatry;  Laterality: Right;   FLEXIBLE SIGMOIDOSCOPY N/A 05/29/2016   Procedure: FLEXIBLE SIGMOIDOSCOPY;  Surgeon: Gladis RAYMOND Mariner, MD;  Location: [facility] ENDOSCOPY;  Service: Endoscopy;  Laterality: N/A;   INCISION AND DRAINAGE Left 08/23/2022   Procedure: INCISION AND DRAINAGE;  Surgeon: [name], DPM;  Location: [facility];  Service: Podiatry;  Laterality: Left;   INCISION AND DRAINAGE OF WOUND Left 09/15/2022   Procedure: 11044 - DEBRIDE BONE and EXCISION IF 1ST METATARSAL BONE WITH  DELAY PRIMARY CLOSURE;  Surgeon: [name], DPM;  Location: [facility];  Service: Podiatry;  Laterality: Left;   IRRIGATION AND DEBRIDEMENT FOOT Left 02/28/2023   Procedure: IRRIGATION AND DEBRIDEMENT FOOT;  Surgeon: [name], DPM;  Location: [facility];   Service: Orthopedics/Podiatry;  Laterality: Left;   KNEE ARTHROSCOPY Left    LOWER EXTREMITY ANGIOGRAPHY Left 08/22/2022   Procedure: Lower Extremity Angiography;  Surgeon: Jama Cordella MATSU, MD;  Location: [facility];  Service: Cardiovascular;  Laterality: Left;   LOWER EXTREMITY ANGIOGRAPHY Left 05/11/2023   Procedure: Lower Extremity Angiography;  Surgeon: Marea Selinda RAMAN, MD;  Location: [facility];  Service: Cardiovascular;  Laterality: Left;   LOWER EXTREMITY ANGIOGRAPHY Right 09/11/2023   Procedure: Lower Extremity Angiography;  Surgeon: Jama Cordella MATSU, MD;  Location: [facility];  Service: Cardiovascular;  Laterality: Right;   LOWER EXTREMITY INTERVENTION Right 09/11/2023   Procedure: LOWER EXTREMITY INTERVENTION;  Surgeon: Jama Cordella MATSU, MD;  Location: [facility];  Service: Cardiovascular;  Laterality: Right;   TEE WITHOUT CARDIOVERSION N/A 03/01/2023   Procedure: TRANSESOPHAGEAL ECHOCARDIOGRAM;  Surgeon: Alluri, Keller BROCKS, MD;  Location: [facility];  Service: Cardiovascular;  Laterality: N/A;   TRANSMETATARSAL AMPUTATION Right 11/02/2023   Procedure: AMPUTATION, FOOT, TRANSMETATARSAL;  Surgeon: [name], DPM;  Location: [facility];  Service: Orthopedics/Podiatry;  Laterality: Right;   WOUND DEBRIDEMENT Left 09/15/2022  Procedure: 11043 - DEBRIDE SKIN. MUSCLE FASCIA;  Surgeon: [name], DPM;  Location: [facility];  Service: Podiatry;  Laterality: Left;    Social History   Socioeconomic History   Marital status: Divorced    Spouse name: Ashley,Angela C   Number of children: Not on file   Years of education: Not on file   Highest education level: Not on file  Occupational History   Not on file  Tobacco Use   Smoking status: Former    Types: Cigars   Smokeless tobacco: Never  Vaping Use   Vaping status: Never Used  Substance and Sexual Activity   Alcohol use: No   Drug use: No   Sexual activity: Never  Other Topics Concern   Not  on file  Social History Narrative   Patient is legally separated and lives at home by himself. His ex -wife is his HCPOA. Neighbors and friends are his support system. He used to work for [employer]    Social Drivers of Corporate investment banker Strain: Low Risk  (12/13/2022)   Received from [facility]   Overall Financial Resource Strain (CARDIA)    Difficulty of Paying Living Expenses: Not hard at all  Food Insecurity: No Food Insecurity (11/01/2023)   Hunger Vital Sign    Worried About Running Out of Food in the Last Year: Never true    Ran Out of Food in the Last Year: Never true  Transportation Needs: No Transportation Needs (11/01/2023)   PRAPARE - Administrator, Civil Service (Medical): No    Lack of Transportation (Non-Medical): No  Physical Activity: Not on file  Stress: Not on file  Social Connections: Moderately Isolated (11/01/2023)   Social Connection and Isolation Panel    Frequency of Communication with Friends and Family: More than three times a week    Frequency of Social Gatherings with Friends and Family: Twice a week    Attends Religious Services: More than 4 times per year    Active Member of [employer] or Organizations: No    Attends Banker Meetings: Never    Marital Status: Divorced  Catering manager Violence: Not At Risk (11/01/2023)   Humiliation, Afraid, Rape, and Kick questionnaire    Fear of Current or Ex-Partner: No    Emotionally Abused: No    Physically Abused: No    Sexually Abused: No    Family History  Problem Relation Age of Onset   Diabetes Mellitus II Mother    CAD Mother    No Known Allergies I? Current Outpatient Medications  Medication Sig Dispense Refill   chlorhexidine  (HIBICLENS ) 4 % external liquid Apply 15 mLs (1 Application total) topically as directed for 30 doses. Use as directed daily for 5 days every other week for 6 weeks. 946 mL 1   clopidogrel  (PLAVIX ) 75 MG tablet  TAKE 1 TABLET BY MOUTH EVERY DAY WITH BREAKFAST 30 tablet 5   Continuous Glucose Sensor (FREESTYLE LIBRE 2 SENSOR) MISC      cyanocobalamin  (VITAMIN B12) 1000 MCG tablet Take 1 tablet (1,000 mcg total) by mouth daily.     docusate sodium  (COLACE) 100 MG capsule Take 1 capsule (100 mg total) by mouth 2 (two) times daily. 100 capsule 0   ELIQUIS  5 MG TABS tablet TAKE 1 TABLET(5 MG) BY MOUTH TWICE DAILY 60 tablet 2   gabapentin  (NEURONTIN ) 300 MG capsule Take 1 capsule (300 mg total) by mouth 3 (three) times daily.  90 capsule 0   glucose blood (ONETOUCH ULTRA) test strip Use 3 (three) times daily     insulin  glargine (LANTUS ) 100 UNIT/ML Solostar Pen Inject 28 Units into the skin at bedtime.     Insulin  Pen Needle (FIFTY50 PEN NEEDLES) 32G X 4 MM MISC USE 4 TIMES DAILY     iron  polysaccharides (NIFEREX) 150 MG capsule Take 1 capsule (150 mg total) by mouth daily.     metoprolol  tartrate (LOPRESSOR ) 25 MG tablet Take 1 tablet (25 mg total) by mouth 2 (two) times daily. 60 tablet 0   oxyCODONE  (OXY IR/ROXICODONE ) 5 MG immediate release tablet Take 1 tablet (5 mg total) by mouth every 6 (six) hours as needed for moderate pain (pain score 4-6). SNF use only.  Refills per SNF providers 12 tablet 0   OZEMPIC, 0.25 OR 0.5 MG/DOSE, 2 MG/3ML SOPN Inject 0.75 mLs into the skin once a week.     pravastatin  (PRAVACHOL ) 80 MG tablet Take 80 mg by mouth daily.     traZODone  (DESYREL ) 50 MG tablet Take 50 mg by mouth at bedtime.     No current facility-administered medications for this visit.     Abtx:  Anti-infectives (From admission, onward)    None       REVIEW OF SYSTEMS:  Const: negative fever, negative chills, negative weight loss Eyes: negative diplopia or visual changes, negative eye pain ENT: negative coryza, negative sore throat Resp: negative cough, hemoptysis, dyspnea Cards: negative for chest pain, palpitations, lower extremity edema GU: negative for frequency, dysuria and hematuria GI:  Negative for abdominal pain, diarrhea, bleeding, constipation Skin: negative for rash and pruritus Heme: negative for easy bruising and gum/nose bleeding MS: as above Neurolo:negative for headaches, dizziness, vertigo, memory problems  Psych: negative for feelings of anxiety, depression  Endocrine: , diabetes Allergy/Immunology- negative for any medication or food allergies ?  Objective:  VITALS:  BP 112/63   Pulse 86   Temp (!) 97.3 F (36.3 C) (Temporal)   SpO2 96%  LDA Rt PICC PHYSICAL EXAM:  General: Alert, cooperative, no distress, appears stated age. In wheel chair Head: Normocephalic, without obvious abnormality, atraumatic. Eyes: Conjunctivae clear, anicteric sclerae. Pupils are equal ENT Nares normal. No drainage or sinus tenderness. Lips, mucosa, and tongue normal. No Thrush Neck: Supple, symmetrical, no adenopathy, thyroid: non tender no carotid bruit and no JVD. Back: No CVA tenderness. Lungs: Clear to auscultation bilaterally. No Wheezing or Rhonchi. No rales. Heart: Regular rate and rhythm, no murmur, rub or gallop. Abdomen: Soft, non-tender,not distended. Bowel sounds normal. No masses Extremities: rt foot TMA site- has superficial ischemia /necrosis at the surgical site There is dehiscence at the lateral side   11/29/23         11/20/23          Left leg BKA Prosthetic leg Skin: No rashes or lesions. Or bruising Lymph: Cervical, supraclavicular normal. Neurologic: Grossly non-focal Pertinent Labs Lab Results CBC    Component Value Date/Time   WBC 7.5 11/07/2023 0425   RBC 2.94 (L) 11/07/2023 0425   HGB 8.5 (L) 11/07/2023 0425   HCT 26.4 (L) 11/07/2023 0425   PLT 196 11/07/2023 0425   MCV 89.8 11/07/2023 0425   MCH 28.9 11/07/2023 0425   MCHC 32.2 11/07/2023 0425   RDW 14.7 11/07/2023 0425   LYMPHSABS 0.9 11/07/2023 0425   MONOABS 0.6 11/07/2023 0425   EOSABS 0.1 11/07/2023 0425   BASOSABS 0.0 11/07/2023 0425       Latest  Ref Rng & Units 11/07/2023    4:25 AM 11/06/2023    5:37 AM 11/05/2023    4:30 AM  CMP  Glucose 70 - 99 mg/dL 831  851  855   BUN 8 - 23 mg/dL 18  19  20    Creatinine 0.61 - 1.24 mg/dL 8.73  8.73  8.76   Sodium 135 - 145 mmol/L 137  137  136   Potassium 3.5 - 5.1 mmol/L 4.0  4.6  4.1   Chloride 98 - 111 mmol/L 106  107  105   CO2 22 - 32 mmol/L 24  25  24    Calcium 8.9 - 10.3 mg/dL 8.5  8.5  8.6      ? Impression/Recommendation ?Diabetic foot infection with peripheral artery disease involving the right foot at the site of prior amputation of the great toe Underwent TMA on 11/02/2023 Culture MRSA / enterococcus fecalis Bone pathology acute osteo but margin clear. Was sent to SNF  on 4 weeks of IV daptomycin  which was extended by 2 more weeks last visit and he will complete on 12/14/23 Some ischemic necrosis of the skin at the surgical site There is some dehiscence at the surgical site on the lateral margin Has 2 pressure wounds from the cam boot just healing Seeing podiatrist today. Saw vascular last week.  ESR improved to 36 CRP 1.3  Will discuss with Dr. Ashley after he sees him regarding further management  PAD Followed by vein and vascular On 11/05/2023 underwent angio and had mechanical thrombectomy of the right anterior tibial artery, balloon angioplasty of the right anterior tibial artery and stent placement of the same.  Angioplasty of the right distal popliteal artery, tibioperoneal trunk and stent placement in the right tibioperoneal trunk   History of left BKA    CAD Status post CABG   A-fib on  Eliquis    ?  ________________________________________________ Discussed with patient, and his friend-   Spoke to Dr. Ashley in the evening. The floor follow-up plans to do some further debridement next week Today's send deep cultures Will continue daptomycin  IV until the culture is back Patient is to continue in skilled nursing facility and has to be  nonweightbearing  [facility] was informed of the IV antibiotic plan by Diminique Gainey  Follow up 3 weeks

## 2023-12-14 ENCOUNTER — Other Ambulatory Visit: Payer: Self-pay | Admitting: Podiatry

## 2023-12-14 NOTE — Telephone Encounter (Signed)
 Patient has been notified with medical recommendations and verbalized understanding

## 2023-12-14 NOTE — Telephone Encounter (Signed)
 Received call from Persina with [facility] requesting orders to extend dapto. Relayed that [facility] accepted verbal orders yesterday.   She states they need them faxed. Orders faxed to [facility] at [phone].  Arlynn Mcdermid, BSN, RN

## 2023-12-17 ENCOUNTER — Telehealth: Payer: Self-pay

## 2023-12-17 NOTE — Telephone Encounter (Signed)
 Received call from [facility] with [facility] regarding IV antibiotics and patients upcoming discharge from SNF. Pt is expected to discharge on 7/20 to home. SNF would like to ask provider if he can bw switched to oral alternative or she would like for him to continue with IV antibiotics. Per Dr. Fayette would need to continue IV dapto through upcoming surgery on 8/22. Relayed to Rn who verbalized understanding. Will speak with case manager regarding HH options for IV antibiotics and picc care. Lorenda CHRISTELLA Code, RMA

## 2023-12-20 ENCOUNTER — Encounter
Admission: RE | Admit: 2023-12-20 | Discharge: 2023-12-20 | Disposition: A | Discharge status: Home / Self Care | Source: Ambulatory Visit | Attending: Podiatry | Admitting: Podiatry

## 2023-12-20 ENCOUNTER — Other Ambulatory Visit: Payer: Self-pay

## 2023-12-20 DIAGNOSIS — Z01812 Encounter for preprocedural laboratory examination: Secondary | ICD-10-CM

## 2023-12-20 DIAGNOSIS — I10 Essential (primary) hypertension: Secondary | ICD-10-CM

## 2023-12-20 DIAGNOSIS — E1140 Type 2 diabetes mellitus with diabetic neuropathy, unspecified: Secondary | ICD-10-CM

## 2023-12-20 DIAGNOSIS — I2510 Atherosclerotic heart disease of native coronary artery without angina pectoris: Secondary | ICD-10-CM

## 2023-12-20 DIAGNOSIS — I4891 Unspecified atrial fibrillation: Secondary | ICD-10-CM

## 2023-12-20 DIAGNOSIS — Z01810 Encounter for preprocedural cardiovascular examination: Secondary | ICD-10-CM

## 2023-12-20 DIAGNOSIS — D509 Iron deficiency anemia, unspecified: Secondary | ICD-10-CM

## 2023-12-20 DIAGNOSIS — D508 Other iron deficiency anemias: Secondary | ICD-10-CM

## 2023-12-20 DIAGNOSIS — M869 Osteomyelitis, unspecified: Secondary | ICD-10-CM

## 2023-12-20 DIAGNOSIS — Z951 Presence of aortocoronary bypass graft: Secondary | ICD-10-CM

## 2023-12-20 NOTE — Patient Instructions (Addendum)
 Your procedure is scheduled on:12/21/23 Report to the Registration Desk on the 1st floor of the [facility]. To find out your arrival time, please call [phone] between 1PM - 3PM on: 12/20/23. If your arrival time is 6:00 am, do not arrive before that time as the [facility] entrance doors do not open until 6:00 am.  REMEMBER: Instructions that are not followed completely may result in serious medical risk, up to and including death; or upon the discretion of your surgeon and anesthesiologist your surgery may need to be rescheduled.  Do not eat food after midnight the night before surgery.  No gum chewing or hard candies.  You may however, drink WATER up to 2 hours before you are scheduled to arrive for your surgery. Do not drink anything within 2 hours of your scheduled arrival time.      One week prior to surgery:  Stop Anti-inflammatories (NSAIDS) such as Advil, Aleve, Ibuprofen, Motrin, Naproxen, Naprosyn and Aspirin  based products such as Excedrin, Goody's Powder, BC Powder. You may take Tylenol  if needed for pain up until the day of surgery.  Stop ANY OVER THE COUNTER supplements until after surgery.    **Follow guidelines for insulin  and diabetes medications.**  - insulin  glargine (LANTUS ) - hold on the morning of surgery.  - metFORMIN (GLUCOPHAGE-XR) hold 2 days. Last dose 12/18/23. - OZEMPIC hold for 7 days prior to surgery, last dose 12/10/23.   **Follow recommendations regarding stopping blood thinners.**  - clopidogrel  (PLAVIX ) - per patient, stopped taking on 12/16/23. - ELIQUIS  -  per patient, stopped taking on 12/16/23    Continue taking all of your other prescription medications up until the day of surgery.   ON THE DAY OF SURGERY ONLY TAKE THESE MEDICATIONS WITH SIPS OF WATER:   metoprolol  tartrate (LOPRESSOR )  2.   gabapentin  (NEURONTIN )      No Alcohol for 24 hours before or after surgery.  No Smoking including e-cigarettes for 24 hours before  surgery.  No chewable tobacco products for at least 6 hours before surgery.  No nicotine patches on the day of surgery.  Do not use any recreational drugs for at least a week (preferably 2 weeks) before your surgery.  Please be advised that the combination of cocaine and anesthesia may have negative outcomes, up to and including death. If you test positive for cocaine, your surgery will be cancelled.  On the morning of surgery brush your teeth with toothpaste and water, you may rinse your mouth with mouthwash if you wish. Do not swallow any toothpaste or mouthwash.  Use CHG Soap or wipes as directed on instruction sheet.  Do not wear jewelry, make-up, hairpins, clips or nail polish.  For welded (permanent) jewelry: bracelets, anklets, waist bands, etc.  Please have this removed prior to surgery.  If it is not removed, there is a chance that hospital personnel will need to cut it off on the day of surgery.  Do not wear lotions, powders, or perfumes.   Do not shave body hair from the neck down 48 hours before surgery.  Contact lenses, hearing aids and dentures may not be worn into surgery.  Do not bring valuables to the hospital. [facility] is not responsible for any missing/lost belongings or valuables.   Notify your doctor if there is any change in your medical condition (cold, fever, infection).  Wear comfortable clothing (specific to your surgery type) to the hospital.  After surgery, you can help prevent lung complications by doing  breathing exercises.  Take deep breaths and cough every 1-2 hours. Your doctor may order a device called an Incentive Spirometer to help you take deep breaths. When coughing or sneezing, hold a pillow firmly against your incision with both hands. This is called "splinting." Doing this helps protect your incision. It also decreases belly discomfort.  If you are being admitted to the hospital overnight, leave your suitcase in the car. After surgery it  may be brought to your room.  In case of increased patient census, it may be necessary for you, the patient, to continue your postoperative care in the Same Day Surgery department.  If you are being discharged the day of surgery, you will not be allowed to drive home. You will need a responsible individual to drive you home and stay with you for 24 hours after surgery.   If you are taking public transportation, you will need to have a responsible individual with you.  Please call the Pre-admissions Testing Dept. at [phone] if you have any questions about these instructions.  Surgery Visitation Policy:  Patients having surgery or a procedure may have two visitors.  Children under the age of 83 must have an adult with them who is not the patient.  Inpatient Visitation:    Visiting hours are 7 a.m. to 8 p.m. Up to four visitors are allowed at one time in a patient room. The visitors may rotate out with other people during the day.  One visitor age 20 or older may stay with the patient overnight and must be in the room by 8 p.m.   Merchandiser, retail to address health-related social needs:  [email or web address]

## 2023-12-21 ENCOUNTER — Other Ambulatory Visit: Payer: Self-pay

## 2023-12-21 ENCOUNTER — Ambulatory Visit

## 2023-12-21 ENCOUNTER — Encounter: Payer: Self-pay | Admitting: Podiatry

## 2023-12-21 ENCOUNTER — Ambulatory Visit
Admission: RE | Admit: 2023-12-21 | Discharge: 2023-12-21 | Disposition: A | Discharge status: Home / Self Care | Attending: Podiatry | Admitting: Podiatry

## 2023-12-21 ENCOUNTER — Ambulatory Visit: Payer: Self-pay | Admitting: Urgent Care

## 2023-12-21 ENCOUNTER — Encounter
Admission: RE | Disposition: A | Discharge status: Home / Self Care | Payer: Self-pay | Source: Home / Self Care | Attending: Podiatry

## 2023-12-21 DIAGNOSIS — N189 Chronic kidney disease, unspecified: Secondary | ICD-10-CM | POA: Insufficient documentation

## 2023-12-21 DIAGNOSIS — I129 Hypertensive chronic kidney disease with stage 1 through stage 4 chronic kidney disease, or unspecified chronic kidney disease: Secondary | ICD-10-CM | POA: Insufficient documentation

## 2023-12-21 DIAGNOSIS — L97514 Non-pressure chronic ulcer of other part of right foot with necrosis of bone: Secondary | ICD-10-CM | POA: Diagnosis present

## 2023-12-21 DIAGNOSIS — E1142 Type 2 diabetes mellitus with diabetic polyneuropathy: Secondary | ICD-10-CM | POA: Diagnosis present

## 2023-12-21 DIAGNOSIS — Z951 Presence of aortocoronary bypass graft: Secondary | ICD-10-CM | POA: Diagnosis not present

## 2023-12-21 DIAGNOSIS — E11621 Type 2 diabetes mellitus with foot ulcer: Secondary | ICD-10-CM | POA: Insufficient documentation

## 2023-12-21 DIAGNOSIS — Z01812 Encounter for preprocedural laboratory examination: Secondary | ICD-10-CM

## 2023-12-21 DIAGNOSIS — I4891 Unspecified atrial fibrillation: Secondary | ICD-10-CM | POA: Diagnosis not present

## 2023-12-21 DIAGNOSIS — E785 Hyperlipidemia, unspecified: Secondary | ICD-10-CM | POA: Diagnosis not present

## 2023-12-21 DIAGNOSIS — L97516 Non-pressure chronic ulcer of other part of right foot with bone involvement without evidence of necrosis: Secondary | ICD-10-CM | POA: Diagnosis present

## 2023-12-21 DIAGNOSIS — E1122 Type 2 diabetes mellitus with diabetic chronic kidney disease: Secondary | ICD-10-CM | POA: Diagnosis not present

## 2023-12-21 DIAGNOSIS — M869 Osteomyelitis, unspecified: Secondary | ICD-10-CM

## 2023-12-21 DIAGNOSIS — I2510 Atherosclerotic heart disease of native coronary artery without angina pectoris: Secondary | ICD-10-CM | POA: Diagnosis not present

## 2023-12-21 DIAGNOSIS — Z01810 Encounter for preprocedural cardiovascular examination: Secondary | ICD-10-CM

## 2023-12-21 DIAGNOSIS — M86172 Other acute osteomyelitis, left ankle and foot: Secondary | ICD-10-CM

## 2023-12-21 DIAGNOSIS — Z87891 Personal history of nicotine dependence: Secondary | ICD-10-CM | POA: Diagnosis not present

## 2023-12-21 DIAGNOSIS — L97416 Non-pressure chronic ulcer of right heel and midfoot with bone involvement without evidence of necrosis: Secondary | ICD-10-CM | POA: Diagnosis not present

## 2023-12-21 DIAGNOSIS — E1140 Type 2 diabetes mellitus with diabetic neuropathy, unspecified: Secondary | ICD-10-CM

## 2023-12-21 DIAGNOSIS — L089 Local infection of the skin and subcutaneous tissue, unspecified: Secondary | ICD-10-CM

## 2023-12-21 DIAGNOSIS — D509 Iron deficiency anemia, unspecified: Secondary | ICD-10-CM

## 2023-12-21 HISTORY — PX: TRANSMETATARSAL AMPUTATION: SHX6197

## 2023-12-21 LAB — CBC
HCT: 32 % — ABNORMAL LOW (ref 39.0–52.0)
Hemoglobin: 10.4 g/dL — ABNORMAL LOW (ref 13.0–17.0)
MCH: 28.3 pg (ref 26.0–34.0)
MCHC: 32.5 g/dL (ref 30.0–36.0)
MCV: 87.2 fL (ref 80.0–100.0)
Platelets: 375 K/uL (ref 150–400)
RBC: 3.67 MIL/uL — ABNORMAL LOW (ref 4.22–5.81)
RDW: 13.2 % (ref 11.5–15.5)
WBC: 10.6 K/uL — ABNORMAL HIGH (ref 4.0–10.5)
nRBC: 0 % (ref 0.0–0.2)

## 2023-12-21 LAB — GLUCOSE, CAPILLARY
Glucose-Capillary: 161 mg/dL — ABNORMAL HIGH (ref 70–99)
Glucose-Capillary: 169 mg/dL — ABNORMAL HIGH (ref 70–99)

## 2023-12-21 SURGERY — AMPUTATION, FOOT, TRANSMETATARSAL
Anesthesia: General | Site: Foot | Laterality: Right

## 2023-12-21 MED ORDER — CHLORHEXIDINE GLUCONATE 0.12 % MT SOLN
15.0000 mL | Freq: Once | OROMUCOSAL | Status: AC
  Administered 2023-12-21: 15 mL via OROMUCOSAL

## 2023-12-21 MED ORDER — CIPROFLOXACIN HCL 500 MG PO TABS
500.0000 mg | ORAL_TABLET | Freq: Two times a day (BID) | ORAL | 0 refills | Status: AC
Start: 2023-12-21 — End: 2023-12-31

## 2023-12-21 MED ORDER — METOCLOPRAMIDE HCL 5 MG/ML IJ SOLN: 5.0000 mg | Freq: Three times a day (TID) | INTRAMUSCULAR | Status: DC | PRN

## 2023-12-21 MED ORDER — MIDAZOLAM HCL 2 MG/2ML IJ SOLN
INTRAMUSCULAR | Status: AC
  Filled 2023-12-21: qty 2

## 2023-12-21 MED ORDER — METOCLOPRAMIDE HCL 10 MG PO TABS: 5.0000 mg | ORAL_TABLET | Freq: Three times a day (TID) | ORAL | Status: DC | PRN

## 2023-12-21 MED ORDER — CHLORHEXIDINE GLUCONATE 4 % EX SOLN: 1.0000 | CUTANEOUS | 1 refills | Status: DC

## 2023-12-21 MED ORDER — FENTANYL CITRATE (PF) 100 MCG/2ML IJ SOLN: 25.0000 ug | INTRAMUSCULAR | Status: DC | PRN

## 2023-12-21 MED ORDER — CHLORHEXIDINE GLUCONATE 0.12 % MT SOLN
OROMUCOSAL | Status: AC
  Filled 2023-12-21: qty 15

## 2023-12-21 MED ORDER — OXYCODONE HCL 5 MG/5ML PO SOLN: 5.0000 mg | Freq: Once | ORAL | Status: DC | PRN

## 2023-12-21 MED ORDER — SODIUM CHLORIDE 0.9 % IV SOLN: INTRAVENOUS | Status: DC

## 2023-12-21 MED ORDER — LIDOCAINE HCL (PF) 1 % IJ SOLN
INTRAMUSCULAR | Status: AC
  Filled 2023-12-21: qty 30

## 2023-12-21 MED ORDER — BUPIVACAINE HCL (PF) 0.5 % IJ SOLN
INTRAMUSCULAR | Status: AC
Start: 2023-12-21 — End: 2023-12-21
  Filled 2023-12-21: qty 30

## 2023-12-21 MED ORDER — ONDANSETRON HCL 4 MG PO TABS: 4.0000 mg | ORAL_TABLET | Freq: Four times a day (QID) | ORAL | Status: DC | PRN

## 2023-12-21 MED ORDER — PROPOFOL 500 MG/50ML IV EMUL
INTRAVENOUS | Status: DC | PRN
  Administered 2023-12-21: 150 ug/kg/min via INTRAVENOUS
  Administered 2023-12-21: 50 mg via INTRAVENOUS

## 2023-12-21 MED ORDER — AMOXICILLIN-POT CLAVULANATE 875-125 MG PO TABS: 1.0000 | ORAL_TABLET | Freq: Two times a day (BID) | ORAL | 0 refills | Status: DC

## 2023-12-21 MED ORDER — DROPERIDOL 2.5 MG/ML IJ SOLN: 0.6250 mg | Freq: Once | INTRAMUSCULAR | Status: DC | PRN

## 2023-12-21 MED ORDER — 0.9 % SODIUM CHLORIDE (POUR BTL) OPTIME
TOPICAL | Status: DC | PRN
  Administered 2023-12-21: 500 mL

## 2023-12-21 MED ORDER — OXYCODONE HCL 5 MG PO TABS: 5.0000 mg | ORAL_TABLET | Freq: Once | ORAL | Status: DC | PRN

## 2023-12-21 MED ORDER — CEFAZOLIN SODIUM-DEXTROSE 2-4 GM/100ML-% IV SOLN
INTRAVENOUS | Status: AC
  Filled 2023-12-21: qty 100

## 2023-12-21 MED ORDER — LIDOCAINE HCL 1 % IJ SOLN
INTRAMUSCULAR | Status: DC | PRN
  Administered 2023-12-21: 18 mL via INTRAMUSCULAR

## 2023-12-21 MED ORDER — SODIUM CHLORIDE 0.9 % IR SOLN
Status: DC | PRN
  Administered 2023-12-21: 1000 mL

## 2023-12-21 MED ORDER — ONDANSETRON HCL 4 MG/2ML IJ SOLN: 4.0000 mg | Freq: Four times a day (QID) | INTRAMUSCULAR | Status: DC | PRN

## 2023-12-21 MED ORDER — ORAL CARE MOUTH RINSE: 15.0000 mL | Freq: Once | OROMUCOSAL | Status: AC

## 2023-12-21 MED ORDER — CEFAZOLIN SODIUM-DEXTROSE 2-4 GM/100ML-% IV SOLN
2.0000 g | INTRAVENOUS | Status: AC
  Administered 2023-12-21: 2 g via INTRAVENOUS

## 2023-12-21 MED ORDER — ACETAMINOPHEN 10 MG/ML IV SOLN: 1000.0000 mg | Freq: Once | INTRAVENOUS | Status: DC | PRN

## 2023-12-21 MED ORDER — MUPIROCIN 2 % EX OINT: 1.0000 | TOPICAL_OINTMENT | Freq: Two times a day (BID) | CUTANEOUS | 0 refills | Status: AC

## 2023-12-21 MED ORDER — LIDOCAINE HCL (PF) 2 % IJ SOLN
INTRAMUSCULAR | Status: AC
  Filled 2023-12-21: qty 5

## 2023-12-21 MED ORDER — PROPOFOL 1000 MG/100ML IV EMUL
INTRAVENOUS | Status: AC
Start: 2023-12-21 — End: 2023-12-21
  Filled 2023-12-21: qty 100

## 2023-12-21 SURGICAL SUPPLY — 45 items
BENZOIN TINCTURE PRP APPL 2/3 (GAUZE/BANDAGES/DRESSINGS) IMPLANT
BLADE MED AGGRESSIVE (BLADE) IMPLANT
BLADE OSC/SAGITTAL 5.5X25 (BLADE) ×1 IMPLANT
BLADE SURG 15 STRL LF DISP TIS (BLADE) IMPLANT
BNDG COHESIVE 4X5 TAN STRL LF (GAUZE/BANDAGES/DRESSINGS) ×1 IMPLANT
BNDG ELASTIC 4X5.8 VLCR NS LF (GAUZE/BANDAGES/DRESSINGS) ×1 IMPLANT
BNDG ESMARCH 4X12 STRL LF (GAUZE/BANDAGES/DRESSINGS) ×1 IMPLANT
BNDG GAUZE DERMACEA FLUFF 4 (GAUZE/BANDAGES/DRESSINGS) ×1 IMPLANT
BNDG STRETCH 4X75 STRL LF (GAUZE/BANDAGES/DRESSINGS) ×1 IMPLANT
CNTNR URN SCR LID CUP LEK RST (MISCELLANEOUS) ×1 IMPLANT
CUFF TOURN SGL QUICK 12 (TOURNIQUET CUFF) IMPLANT
CUFF TOURN SGL QUICK 18X4 (TOURNIQUET CUFF) IMPLANT
DRAIN PENROSE 12X.25 LTX STRL (MISCELLANEOUS) IMPLANT
DRAPE FLUOR MINI C-ARM 54X84 (DRAPES) IMPLANT
DRSG MEPILEX FLEX 3X3 (GAUZE/BANDAGES/DRESSINGS) IMPLANT
DRSG MEPILEX HEEL 8.7X9.1 (GAUZE/BANDAGES/DRESSINGS) IMPLANT
ELECTRODE REM PT RTRN 9FT ADLT (ELECTROSURGICAL) ×1 IMPLANT
GAUZE SPONGE 4X4 12PLY STRL (GAUZE/BANDAGES/DRESSINGS) ×2 IMPLANT
GAUZE XEROFORM 1X8 LF (GAUZE/BANDAGES/DRESSINGS) ×1 IMPLANT
GLOVE BIO SURGEON STRL SZ7.5 (GLOVE) ×1 IMPLANT
GLOVE INDICATOR 8.0 STRL GRN (GLOVE) ×1 IMPLANT
GOWN STRL REUS W/ TWL XL LVL3 (GOWN DISPOSABLE) ×1 IMPLANT
GOWN STRL REUS W/TWL XL LVL4 (GOWN DISPOSABLE) ×1 IMPLANT
HANDLE YANKAUER SUCT BULB TIP (MISCELLANEOUS) IMPLANT
HANDPIECE VERSAJET DEBRIDEMENT (MISCELLANEOUS) IMPLANT
IV NS 1000ML BAXH (IV SOLUTION) IMPLANT
KIT TURNOVER KIT A (KITS) ×1 IMPLANT
LABEL OR SOLS (LABEL) IMPLANT
MANIFOLD NEPTUNE II (INSTRUMENTS) ×1 IMPLANT
NDL HYPO 25X1 1.5 SAFETY (NEEDLE) ×1 IMPLANT
NEEDLE HYPO 25X1 1.5 SAFETY (NEEDLE) ×2 IMPLANT
NS IRRIG 500ML POUR BTL (IV SOLUTION) ×1 IMPLANT
PACK EXTREMITY ARMC (MISCELLANEOUS) ×1 IMPLANT
PAD ABD DERMACEA PRESS 5X9 (GAUZE/BANDAGES/DRESSINGS) ×2 IMPLANT
PENCIL SMOKE EVACUATOR (MISCELLANEOUS) ×1 IMPLANT
SOLUTION PREP PVP 2OZ (MISCELLANEOUS) ×1 IMPLANT
SPONGE T-LAP 18X18 ~~LOC~~+RFID (SPONGE) ×1 IMPLANT
STOCKINETTE M/LG 89821 (MISCELLANEOUS) ×1 IMPLANT
STRIP CLOSURE SKIN 1/2X4 (GAUZE/BANDAGES/DRESSINGS) IMPLANT
SUT ETHILON 2 0 FS 18 (SUTURE) IMPLANT
SUT VIC AB 2-0 SH 27XBRD (SUTURE) IMPLANT
SUTURE EHLN 3-0 FS-10 30 BLK (SUTURE) ×1 IMPLANT
SWAB CULTURE AMIES ANAERIB BLU (MISCELLANEOUS) IMPLANT
SYR 10ML LL (SYRINGE) ×2 IMPLANT
TRAP FLUID SMOKE EVACUATOR (MISCELLANEOUS) ×1 IMPLANT

## 2023-12-21 NOTE — H&P (Signed)
 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  The various methods of treatment have been discussed with the patient.  No guarantees were given.  After consideration of risks, benefits and other options for treatment, the patient has consented to surgery.  I have reviewed the patients' chart and labs.     A history and physical examination was performed in my office.  The patient was reexamined.  There have been no changes to this history and physical examination.  [name] A

## 2023-12-21 NOTE — Progress Notes (Signed)
 PICC Removal Note:  PICC line removed from Right arm antecubital after sterile site prepped per protocol. PICC catheter tip visualized and intact. Pressure dressing applied with Vaseline gauze, 4x4, and paper tape: No redness, ecchymosis, edema, swelling, or drainage noted at site.Instructions provided on post PICC discharge care, including followup notification instructions.

## 2023-12-21 NOTE — Op Note (Signed)
 Operative note   Surgeon:Vaudine Dutan [employer] logistics/support/administrative officer: None    Preop diagnosis: Nonhealing transmetatarsal amputation right foot    Postop diagnosis: Same    Procedure: Revision transmetatarsal amputation right foot.  Intraoperative fluoroscopy use    EBL: 20 mL    Anesthesia:local and IV sedation.  Local consisted of a total of 18 cc of one-to-one mixture of 0.25% plain bupivacaine  and 2% lidocaine .    Hemostasis: None    Specimen: Deep wound culture, bone for culture, and bone for pathology with proximal margin inked    Complications: None    Operative indications:Gentle H Eichenberger is an 73 y.o. that presents today for surgical intervention.  The risks/benefits/alternatives/complications have been discussed and consent has been given.    Procedure:  Patient was brought into the OR and placed on the operating table in thesupine position. After anesthesia was obtained theright lower extremity was prepped and draped in usual sterile fashion.  Attention was directed to the distal right foot where the dorsal lateral incision site had dehisced with large amounts of fibrotic tissue.  There was a small skin island close down over the first metatarsal region but an open wound to the lateral aspect of the first metatarsal.  Initially all nonviable tissue was removed with a Versajet down to the level of and including bone and deep tissue.  At this time the 2nd through 5th metatarsals were debrided away with a 15 blade to expose further dorsal bone.  A power saw was then used to remove the distal portion of the metatarsals.  This was to the level of the proximal one third of the metatarsals.  A deep wound culture was performed in this region.  The bone from this area was marked at the proximal margin and sent for pathological examination.  All bleeders were cauterized as needed.  Attention was then directed to the medial aspect of the first metatarsal where the previous dehisced wound was noted.  Further  dissection of the distal portion of the residual first metatarsal was performed and an approximate 1 cm portion of the distal first metatarsal was then removed from the surgical field in toto.  This was sent for wound culture.  All wounds were flushed with copious amounts of irrigation.  Further dissection of the tissue was performed and the Versajet to good healthy bleeding tissue.  Closure was performed with a 2-0 Vicryl for the deeper tissues.  The skin was closed with vertical mattresses.  This was using a 2-0 nylon.  A bulky sterile dressing was applied.  Good perfusion of the skin flaps were noted.  New dressing was applied.  Patient has pressure induced ulcers to the medial and lateral aspect of the ankle as well as lateral right foot.  These were covered with silicone border foam pads and the remainder of the foot was covered with bulky sterile dressing.    Patient tolerated the procedure and anesthesia well.  Was transported from the OR to the PACU with all vital signs stable and vascular status intact. To be discharged per routine protocol.  Will follow up in approximately 1 week in the outpatient clinic.

## 2023-12-21 NOTE — Discharge Instructions (Addendum)
 Dressing changes: Leave the current dressing in place until Monday.  Then change 3 times a week.  Paint incision with Betadine .  Apply gauze to the incision and a padded dressing.  Change silicone bordered foam pad to heel and lateral foot with each dressing change 3 times a week.  [facility]  POST OPERATIVE INSTRUCTIONS FOR DR. ASHLEY AND DR. BAKER [facility] PODIATRY DEPARTMENT   Take your medication as prescribed.  Pain medication should be taken only as needed.  Okay to restart Eliquis  and Plavix  tomorrow.  Keep your foot elevated above the heart level for the first 48 hours.  We have instructed you to be non-weight bearing.  Always wear your post-op shoe when walking.  Always use your crutches if you are to be non-weight bearing.  Do not take a shower. Baths are permissible as long as the foot is kept out of the water.   Every hour you are awake:  Bend your knee 15 times. Flex foot 15 times Massage calf 15 times  Call [facility] [phone]) if any of the following problems occur: You develop a temperature or fever. The bandage becomes saturated with blood. Medication does not stop your pain. Injury of the foot occurs. Any symptoms of infection including redness, odor, or red streaks running from wound.

## 2023-12-21 NOTE — Transfer of Care (Signed)
 Immediate Anesthesia Transfer of Care Note  [name] Feeling  Procedure(s) Performed: REVISION, AMPUTATION, FOOT, TRANSMETATARSAL (Right: Foot)  Patient Location: PACU  Anesthesia Type:General  Level of Consciousness: drowsy  Airway & Oxygen Therapy: Patient Spontanous Breathing  Post-op Assessment: Report given to RN and Post -op Vital signs reviewed and stable  Post vital signs: Reviewed and stable  Last Vitals:  Vitals Value Taken Time  BP 114/65 12/21/23 13:45  Temp    Pulse 89 12/21/23 13:50  Resp 22 12/21/23 13:50  SpO2 96 % 12/21/23 13:50  Vitals shown include unfiled device data.  Last Pain:  Vitals:   12/21/23 1041  TempSrc: Temporal  PainSc: 0-No pain         Complications: There were no known notable events for this encounter.

## 2023-12-21 NOTE — Anesthesia Preprocedure Evaluation (Addendum)
 Anesthesia Evaluation  Patient identified by MRN, date of birth, ID band Patient awake    Reviewed: Allergy & Precautions, NPO status , Patient's Chart, lab work & pertinent test results  History of Anesthesia Complications Negative for: history of anesthetic complications  Airway Mallampati: III  TM Distance: <3 FB Neck ROM: full    Dental  (+) Chipped   Pulmonary neg shortness of breath, former smoker   Pulmonary exam normal        Cardiovascular Exercise Tolerance: Good hypertension, + CAD, + CABG and + Peripheral Vascular Disease  + dysrhythmias Atrial Fibrillation      Neuro/Psych  Neuromuscular disease  negative psych ROS   GI/Hepatic negative GI ROS, Neg liver ROS,,,  Endo/Other  diabetes, Type 2    Renal/GU Renal disease     Musculoskeletal   Abdominal Normal abdominal exam  (+)   Peds  Hematology  (+) Blood dyscrasia, anemia   Anesthesia Other Findings Past Medical History: 08/24/2022: Acute osteomyelitis of left ankle or foot (HCC) 09/05/2022: AKI (acute kidney injury) (HCC) 09/03/2023: 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) 08/19/2022: Atrial fibrillation with RVR (HCC) No date: Bladder neck obstruction 08/17/2022: Cellulitis No date: Chronic kidney disease No date: Coronary artery disease     Comment:  a.) s/p 4v CABG in 2014 No date: Diabetes mellitus without complication (HCC) No date: Diabetic neuropathy (HCC) No date: Diabetic peripheral neuropathy (HCC) 12/21/2017: Diabetic ulcer of toe of left foot associated with  diabetes mellitus of other type, limited to breakdown of skin (HCC) No date: Diverticulosis No date: Gout 05/11/2023: Gram-negative bacteremia No date: Heart murmur 05/31/2015: History of osteomyelitis No date: Hypercholesteremia No date: Hyperlipidemia No date: Hypertension 08/17/2022: Hypotension due to hypovolemia 08/19/2022:  Infection of left foot 02/28/2023: MSSA bacteremia 05/10/2023: Open wound of left foot with complication 05/31/2015: Osteomyelitis (HCC) No date: Peripheral neuropathy 02/26/2023: Postural dizziness with presyncope 06/09/2023: S/P BKA (below knee amputation) unilateral, left (HCC) 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 08/29/2022: S/P transmetatarsal amputation of foot, left (HCC) 08/17/2022: Sepsis (HCC) 05/10/2023: Sepsis (HCC) 11/01/2015: Status post amputation of toe of right foot (HCC) No date: Tubular adenoma No date: Vitamin D  deficiency  Past Surgical History: 09/14/2023: ACHILLES TENDON SURGERY; Right     Comment:  Procedure: TENOTOMY, ACHILLES;  Surgeon: [name],              DPM;  Location: [facility];  Service: Orthopedics/Podiatry;              Laterality: Right; 08/19/2022: AMPUTATION; Left     Comment:  Procedure: TRANSMETATARSAL AMPUTATION LEFT FOOT WITH               IRRIGATION AND DEBRIDEMENT;  Surgeon: Lennie Barter, DPM;              Location: [facility];  Service: Podiatry;  Laterality:               Left; 05/16/2023: AMPUTATION; Left     Comment:  Procedure: AMPUTATION BELOW KNEE;  Surgeon: Marea Selinda RAMAN, MD;  Location: [facility];  Service: General;                Laterality: Left; 09/14/2023: AMPUTATION; Right     Comment:  Procedure: AMPUTATION, FOOT, RAY;  Surgeon: [name], DPM;  Location: [facility];  Service:               Orthopedics/Podiatry;  Laterality: Right; 06/01/2015: AMPUTATION TOE; Right     Comment:  Procedure: AMPUTATION TOE;  Surgeon: Donnice Cory,               DPM;  Location: [facility];  Service: Podiatry;                Laterality: Right; 08/11/2022: AMPUTATION TOE; Left     Comment:  Procedure: AMPUTATION TOE 2, 3, 4;  Surgeon: [name], DPM;  Location: [facility];  Service: Podiatry;                Laterality: Left; No date: CATARACT EXTRACTION, BILATERAL 06/12/2022: CIRCUMCISION; N/A     Comment:   Procedure: CIRCUMCISION ADULT;  Surgeon: Penne Knee, MD;  Location: [facility];  Service: Urology;                Laterality: N/A; 02/14/2016: COLONOSCOPY WITH PROPOFOL ; N/A     Comment:  Procedure: COLONOSCOPY WITH PROPOFOL ;  Surgeon: Gladis RAYMOND Mariner, MD;  Location: [facility];  Service:               Endoscopy;  Laterality: N/A; 01/07/2019: COLONOSCOPY WITH PROPOFOL ; N/A     Comment:  Procedure: COLONOSCOPY WITH PROPOFOL ;  Surgeon:               Mariner Gladis RAYMOND, MD;  Location: [facility] ENDOSCOPY;                Service: Endoscopy;  Laterality: N/A; 08/2012: CORONARY ARTERY BYPASS GRAFT; N/A 06/01/2015: EXCISION PARTIAL PHALANX; Right     Comment:  Procedure: EXCISION PARTIAL PHALANX /  BONE;  Surgeon:               Donnice Cory, DPM;  Location: [facility];  Service:               Podiatry;  Laterality: Right; 05/29/2016: FLEXIBLE SIGMOIDOSCOPY; N/A     Comment:  Procedure: FLEXIBLE SIGMOIDOSCOPY;  Surgeon: Gladis RAYMOND Mariner, MD;  Location: [facility] ENDOSCOPY;  Service:               Endoscopy;  Laterality: N/A; 08/23/2022: INCISION AND DRAINAGE; Left     Comment:  Procedure: INCISION AND DRAINAGE;  Surgeon: [name], DPM;  Location: [facility];  Service: Podiatry;                Laterality: Left; 09/15/2022: INCISION AND DRAINAGE OF WOUND; Left     Comment:  Procedure: 11044 - DEBRIDE BONE and EXCISION IF 1ST               METATARSAL BONE WITH  DELAY PRIMARY CLOSURE;  Surgeon:               [name], DPM;  Location: [facility];  Service:               Podiatry;  Laterality: Left; 02/28/2023: IRRIGATION AND DEBRIDEMENT FOOT; Left     Comment:  Procedure: IRRIGATION AND DEBRIDEMENT FOOT;  Surgeon:  [name], DPM;  Location: [facility];  Service:               Orthopedics/Podiatry;  Laterality: Left; No date: KNEE ARTHROSCOPY; Left 08/22/2022: LOWER EXTREMITY ANGIOGRAPHY; Left     Comment:  Procedure:  Lower Extremity Angiography;  Surgeon:               Jama Cordella MATSU, MD;  Location: [facility];               Service: Cardiovascular;  Laterality: Left; 05/11/2023: LOWER EXTREMITY ANGIOGRAPHY; Left     Comment:  Procedure: Lower Extremity Angiography;  Surgeon: Marea Selinda RAMAN, MD;  Location: [facility];  Service:               Cardiovascular;  Laterality: Left; 09/11/2023: LOWER EXTREMITY ANGIOGRAPHY; Right     Comment:  Procedure: Lower Extremity Angiography;  Surgeon:               Jama Cordella MATSU, MD;  Location: [facility];               Service: Cardiovascular;  Laterality: Right; 09/11/2023: LOWER EXTREMITY INTERVENTION; Right     Comment:  Procedure: LOWER EXTREMITY INTERVENTION;  Surgeon:               Jama Cordella MATSU, MD;  Location: [facility];               Service: Cardiovascular;  Laterality: Right; 03/01/2023: TEE WITHOUT CARDIOVERSION; N/A     Comment:  Procedure: TRANSESOPHAGEAL ECHOCARDIOGRAM;  Surgeon:               Wilburn Keller BROCKS, MD;  Location: [facility];  Service:               Cardiovascular;  Laterality: N/A; 09/15/2022: WOUND DEBRIDEMENT; Left     Comment:  Procedure: 11043 - DEBRIDE SKIN. MUSCLE FASCIA;                Surgeon: [name], DPM;  Location: [facility];                Service: Podiatry;  Laterality: Left;  BMI    Body Mass Index: 23.50 kg/m      Reproductive/Obstetrics negative OB ROS                              Anesthesia Physical Anesthesia Plan  ASA: 3  Anesthesia Plan: General   Post-op Pain Management: Regional block*   Induction: Intravenous  PONV Risk Score and Plan: Ondansetron , Dexamethasone , Treatment may vary due to age or medical condition, TIVA and Propofol  infusion  Airway Management Planned: Natural Airway and Simple Face Mask  Additional Equipment:   Intra-op Plan:   Post-operative Plan:   Informed Consent: I have reviewed  the patients History and Physical, chart, labs and discussed the procedure including the risks, benefits and alternatives for the proposed anesthesia with the patient or authorized representative who has indicated his/her understanding and acceptance.     Dental Advisory Given  Plan Discussed with: Anesthesiologist, CRNA and Surgeon  Anesthesia Plan Comments:          Anesthesia Quick Evaluation

## 2023-12-24 ENCOUNTER — Encounter: Payer: Self-pay | Admitting: Podiatry

## 2023-12-25 LAB — SURGICAL PATHOLOGY

## 2023-12-25 NOTE — Anesthesia Postprocedure Evaluation (Signed)
 Anesthesia Post Note  [name] Feeling  Procedure(s) Performed: REVISION, AMPUTATION, FOOT, TRANSMETATARSAL (Right: Foot)  Patient location during evaluation: PACU Anesthesia Type: General Level of consciousness: awake and alert Pain management: pain level controlled Vital Signs Assessment: post-procedure vital signs reviewed and stable Respiratory status: spontaneous breathing, nonlabored ventilation and respiratory function stable Cardiovascular status: blood pressure returned to baseline and stable Postop Assessment: no apparent nausea or vomiting Anesthetic complications: no   There were no known notable events for this encounter.   Last Vitals:  Vitals:   12/21/23 1445 12/21/23 1504  BP: 111/72 114/71  Pulse: 72 73  Resp: 20 18  Temp:  36.6 C  SpO2: 98% 98%    Last Pain:  Vitals:   12/21/23 1504  TempSrc: Temporal  PainSc:                  Camellia Merilee Louder

## 2023-12-26 LAB — AEROBIC/ANAEROBIC CULTURE W GRAM STAIN (SURGICAL/DEEP WOUND)
Culture: NO GROWTH
Gram Stain: NONE SEEN

## 2023-12-27 NOTE — Progress Notes (Signed)
 Chief Complaint:   Chief Complaint  Patient presents with  . Post Operative Visit    Right foot    Subjective  HPI  Andrew Harding is a 73 y.o. male who presents for Post Operative Visit (Right foot) History of Present Illness Andrew Harding is a 73 year old male who presents for follow-up care after foot surgery.  He is recovering from foot surgery where bone was removed and is currently on oral antibiotics due to a wound culture showing bacterial presence. The infection is not MRSA. He wants strong antibiotics to ensure the infection is cleared.  Dressings are changed three times a week on Monday, Wednesday, and Thursday.  He is staying in a rehab facility and was supposed to leave last Friday but remains due to the surgery. He is undergoing physical therapy, usually sitting down and doing exercises, and is following a non-weight-bearing protocol.  He shares social interactions, mentioning a connection with a nurse's family and a humorous anecdote about attending a funeral. He describes the rehab facility as challenging but necessary for his recovery.  Review of Systems  Patient Active Problem List  Diagnosis  . Coronary artery disease  . Mixed hyperlipidemia  . Gout  . Vitamin D  deficiency  . Benign essential hypertension  . CKD (chronic kidney disease) stage 3, GFR 30-59 ml/min (CMS/HHS-HCC)  . Status post amputation of toe of right foot (CMS/HHS-HCC)  . Diabetic polyneuropathy associated with type 2 diabetes mellitus (CMS/HHS-HCC)  . Type 2 diabetes mellitus with stage 3 chronic kidney disease, with long-term current use of insulin  (CMS/HHS-HCC)  . Type 2 diabetes mellitus with vascular disease (CMS/HHS-HCC)  . Uncontrolled type 2 diabetes mellitus with hyperglycemia, with long-term current use of insulin  (CMS/HHS-HCC)  . Paroxysmal atrial fibrillation (CMS/HHS-HCC)  . Postural dizziness with presyncope  . S/P BKA (below knee amputation) unilateral, left (CMS/HHS-HCC)  .  Atherosclerosis of native arteries of extremity with intermittent claudication ()  . Atherosclerosis of native arteries of the extremities with ulceration (CMS/HHS-HCC)  . Diabetic foot infection  (CMS/HHS-HCC)  . B12 deficiency anemia  . Absence of lower extremity (CMS/HHS-HCC)  . Acquired absence of organ  . Atherosclerosis of coronary artery without angina pectoris  . Difficulty walking  . Dysphagia  . Methicillin susceptible Staphylococcus aureus infection  . Muscle weakness    Outpatient Medications Prior to Visit  Medication Sig Dispense Refill  . amoxicillin -clavulanate (AUGMENTIN ) 875-125 mg tablet Take 1 tablet by mouth 2 (two) times daily    . ciprofloxacin  HCl (CIPRO ) 500 MG tablet Take 500 mg by mouth 2 (two) times daily    . mupirocin  (BACTROBAN ) 2 % ointment Apply 1 Application topically 2 (two) times daily    . apixaban  (ELIQUIS ) 5 mg tablet Take 5 mg by mouth every 12 (twelve) hours    . blood-glucose sensor (FREESTYLE LIBRE 2 PLUS SENSOR) Use 1 each every 15 (fifteen) days 6 each 1  . blood-glucose sensor (FREESTYLE LIBRE 2 PLUS SENSOR) Use 1 each every 15 (fifteen) days 6 each 1  . chlorhexidine  (HIBICLENS ) 4 % external wash Apply 1 Application topically    . clopidogreL  (PLAVIX ) 75 mg tablet Take 75 mg by mouth once daily    . cyanocobalamin  (VITAMIN B12) 1000 MCG tablet Take 1,000 mcg by mouth once daily    . dulaglutide (TRULICITY) 0.75 mg/0.5 mL subcutaneous pen injector Inject 0.75 mg subcutaneously once a week    . flash glucose sensor (FREESTYLE LIBRE 2 SENSOR) Kit USE 1 SENSOR EVERY 14 DAYS FOR GLUCOSE  MONITORING AS DIRECTED    . gabapentin  (NEURONTIN ) 300 MG capsule Take 1 capsule (300 mg total) by mouth 3 (three) times daily 270 capsule 3  . insulin  GLARGINE (LANTUS  SOLOSTAR U-100 INSULIN ) pen injector (concentration 100 units/mL) Inject 36 Units subcutaneously at bedtime 45 mL 3  . iron  polysaccharides (FERREX) 150 mg iron  capsule Take 150 mg by mouth once  daily    . lancets (ONETOUCH DELICA PLUS LANCET) Use 1 each 3 (three) times daily 300 each 3  . metFORMIN (GLUCOPHAGE-XR) 500 MG XR tablet Take 4 tablets (2,000 mg total) by mouth daily with dinner 360 tablet 3  . metoprolol  TARTrate (LOPRESSOR ) 25 MG tablet Take 1 tablet (25 mg total) by mouth 2 (two) times daily 180 tablet 3  . oxyCODONE -acetaminophen  (PERCOCET) 5-325 mg tablet Take 1-2 tablets by mouth    . pen needle, diabetic (BD ULTRA-FINE NANO PEN NEEDLE) 32 gauge x 5/32 Ndle 1 each once daily 100 each 1  . pravastatin  (PRAVACHOL ) 80 MG tablet Take 1 tablet (80 mg total) by mouth every evening 90 tablet 3  . semaglutide (OZEMPIC) 1 mg/dose (4 mg/3 mL) pen injector Inject 0.75 mLs (1 mg total) subcutaneously once a week 9 mL 3  . traZODone  (DESYREL ) 50 MG tablet Take 1 tablet (50 mg total) by mouth at bedtime as needed 90 tablet 3  . oxyCODONE  (ROXICODONE ) 5 MG immediate release tablet Take 5 mg by mouth     No facility-administered medications prior to visit.      Objective  Vitals:   12/27/23 1409  BP: (!) 160/81  Weight: 82.6 kg (182 lb 1.6 oz)  Height: 190.5 cm (6' 3)  PainSc: 0-No pain   Body mass index is 22.76 kg/m.  Home Vitals:     Physical Exam SKIN: Skin clean and well-healed. Lesions drying up and healing well. No dehiscence throughout the foot.  Noted bloody drainage on the bandage today.  Afforded culture results.  This looks to be contamination though there was one Enterobacter result noted.   The following labs were personally reviewed: - Lab Results  Component Value Date   WBC 6.1 06/19/2023   HGB 12.1 (L) 06/19/2023   HCT 37.5 (L) 06/19/2023   PLT 207 06/19/2023   - Lab Results  Component Value Date   NA 142 06/19/2023   K 4.4 06/19/2023   CL 110 (H) 06/19/2023   CO2 17.0 (L) 06/19/2023   BUN 15 06/19/2023   CREATININE 1.1 06/19/2023   GLUCOSE 117 (H) 06/19/2023   - Lab Results  Component Value Date   NA 142 06/19/2023   K 4.4  06/19/2023   CL 110 (H) 06/19/2023   CO2 17.0 (L) 06/19/2023   BUN 15 06/19/2023   CREATININE 1.1 06/19/2023   CALCIUM 8.8 06/19/2023   TP 6.5 09/17/2012   ALB 4.2 06/19/2023   TBILI 0.4 06/19/2023   ALKPHOS 53 06/19/2023   AST 19 06/19/2023   ALT 14 06/19/2023   GLUCOSE 117 (H) 06/19/2023   GFR 71 06/19/2023   - Lab Results  Component Value Date   HGBA1C 6.8 (H) 06/19/2023      Results LABS Wound Culture: No growth aerobically on the bone; Enterobacter, Granulicatella adiacens (12/21/2023)  RADIOLOGY Foot X-ray: No osteomyelitis (12/27/2023)  Procedure: Suture removal and replacement Description: SteriStrips were removed and replaced. Internal and external sutures were noted.     Assessment/Plan:   Assessment & Plan Non-healing right foot surgical wound post partial amputation with recent infection,  on antibiotics, no current MRSA The surgical wound on the right foot post partial amputation is improving with reduced size and clean skin. Drainage is present, but no current MRSA infection. Cultures indicate colonization with Pseudomonas and Enterobacter, without overt infection. - Continue antibiotics as prescribed - Perform dressing changes three times a week on Sunday, Tuesday, and Thursday - Monitor for drainage and signs of infection - Send culture results to Dr. 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 Recent surgical intervention included bone removal and application of Betadine . - Continue dressing changes three times a week - Monitor healing progress and signs of necrosis  Type 2 diabetes mellitus with diabetic polyneuropathy Type 2 diabetes mellitus is managed with insulin  and oral medications. Diabetic polyneuropathy contributes to impaired mobility and wound healing challenges. - Monitor blood glucose levels  regularly  Impaired mobility and non-weight bearing status, right lower extremity, requiring intensive rehabilitation Impaired mobility due to non-weight bearing status on the right lower extremity following partial foot amputation. Intensive rehabilitation is required to improve mobility and prevent complications. He is currently in a rehab facility to facilitate recovery. - Continue intensive rehabilitation at the rehab facility - Write a letter to the rehab center to justify continued stay for intensive rehabilitation and skilled nursing assistance with dressing care. - Reassess weight-bearing status in one month, contingent on healing progress  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 (CMS/HHS-HCC) -     X-ray foot right 3 plus views; Future          Future Appointments     Date/Time Provider Department Center Visit Type   01/18/2024 2:30 PM Solum, Therisa Setter, MD [facility] RETURN VISIT       There are no Patient Instructions on file for this visit.   This note has been created using automated tools and reviewed for accuracy by Andrew Harding.

## 2023-12-28 ENCOUNTER — Ambulatory Visit: Payer: Self-pay

## 2023-12-28 LAB — AEROBIC/ANAEROBIC CULTURE W GRAM STAIN (SURGICAL/DEEP WOUND): Gram Stain: NONE SEEN

## 2024-01-01 ENCOUNTER — Ambulatory Visit: Admitting: Infectious Diseases

## 2024-01-01 NOTE — Telephone Encounter (Signed)
 [facility] call for clarification on Levaquin. Nurse is asking for end date. Per note informed pt is supposed to be on this for two weeks and would reevaluate on appt 9/9. Verbalized understanding.  Lorenda CHRISTELLA Code, RMA

## 2024-01-08 ENCOUNTER — Encounter: Payer: Self-pay | Admitting: Infectious Diseases

## 2024-01-08 ENCOUNTER — Ambulatory Visit: Attending: Infectious Diseases | Admitting: Infectious Diseases

## 2024-01-08 VITALS — BP 117/75 | HR 82 | Temp 97.3°F

## 2024-01-08 DIAGNOSIS — Z89512 Acquired absence of left leg below knee: Secondary | ICD-10-CM | POA: Diagnosis not present

## 2024-01-08 DIAGNOSIS — L089 Local infection of the skin and subcutaneous tissue, unspecified: Secondary | ICD-10-CM | POA: Insufficient documentation

## 2024-01-08 DIAGNOSIS — E1151 Type 2 diabetes mellitus with diabetic peripheral angiopathy without gangrene: Secondary | ICD-10-CM | POA: Diagnosis not present

## 2024-01-08 DIAGNOSIS — Z8614 Personal history of Methicillin resistant Staphylococcus aureus infection: Secondary | ICD-10-CM | POA: Diagnosis not present

## 2024-01-08 DIAGNOSIS — Z955 Presence of coronary angioplasty implant and graft: Secondary | ICD-10-CM | POA: Diagnosis not present

## 2024-01-08 DIAGNOSIS — I2510 Atherosclerotic heart disease of native coronary artery without angina pectoris: Secondary | ICD-10-CM | POA: Diagnosis not present

## 2024-01-08 DIAGNOSIS — E1142 Type 2 diabetes mellitus with diabetic polyneuropathy: Secondary | ICD-10-CM | POA: Diagnosis not present

## 2024-01-08 DIAGNOSIS — I4891 Unspecified atrial fibrillation: Secondary | ICD-10-CM | POA: Insufficient documentation

## 2024-01-08 DIAGNOSIS — E11628 Type 2 diabetes mellitus with other skin complications: Secondary | ICD-10-CM | POA: Diagnosis not present

## 2024-01-08 DIAGNOSIS — Z7901 Long term (current) use of anticoagulants: Secondary | ICD-10-CM | POA: Diagnosis not present

## 2024-01-08 DIAGNOSIS — E11621 Type 2 diabetes mellitus with foot ulcer: Secondary | ICD-10-CM | POA: Insufficient documentation

## 2024-01-08 DIAGNOSIS — B9562 Methicillin resistant Staphylococcus aureus infection as the cause of diseases classified elsewhere: Secondary | ICD-10-CM

## 2024-01-08 DIAGNOSIS — Z89421 Acquired absence of other right toe(s): Secondary | ICD-10-CM

## 2024-01-08 DIAGNOSIS — Z89411 Acquired absence of right great toe: Secondary | ICD-10-CM | POA: Insufficient documentation

## 2024-01-08 DIAGNOSIS — Z951 Presence of aortocoronary bypass graft: Secondary | ICD-10-CM | POA: Diagnosis not present

## 2024-01-08 DIAGNOSIS — B952 Enterococcus as the cause of diseases classified elsewhere: Secondary | ICD-10-CM

## 2024-01-08 NOTE — Progress Notes (Unsigned)
 NAME: Andrew Harding  DOB: 21-Feb-1951  MRN: [id number]  Date/Time: 01/08/2024 10:50 AM   Subjective:  Follow up visit for right foot infection He is here with his friend He recently had debridement on 8/22- culture was enterobacter , pseudeschericia, and granulocutela- He was placed on augmentin  and levaquin and IV daptomycin  was completed and PICc was removed HE still has a place on the lateral margin which is oozing fluid- a repeat cultrue taken there on 9/4 is enterobacter - susceptibility pending- was sent to [facility] May need IV antibiotc    Following taken from visit note Andrew Harding is a 73 y.o. male with a history of diabetes mellitus, peripheral neuropathy, PAD left BKA ,CAD status post CABG, hypertension,  A-fib on Eliquis , right great toe amputation Presented to [facility] on 7/7  with nonhealing wound on right great toe at the site of the prior amputation Patient  in the month of May 2025   underwent excision first metatarsal right foot along with excision of the sesamoid  right first MTP joint by Dr. Ashley..  Culture then was MRSA.  Patient placed on  Bactrim   Patient at that time also underwent angio and had angioplasty and stent placement to the right anterior tibial artery, percutaneous transluminal angioplasty of the right mid popliteal artery and the right tibioperoneal trunk.. As the wound progressed he underwent TMA on 11/02/23 Pathology acute osteo but margin clear of osteo- culture MRSA/ enterococcus fecalis - decided to treat with 4 weeks of IV dapto which were extended by 2 more weeks to complete 6 weeks on 12/14/2023 The wound healing has been complicated by superficial necrosis of the surgical site and also some dehiscence He also had developed 2 pressure ulcers on the malleolus from the cam boot.  This has gotten better since he removed the cam boot.   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 (HCC) 09/03/2023   Atrial fibrillation with RVR (HCC) 08/19/2022   Bladder neck obstruction    Cellulitis 08/17/2022   Chronic kidney disease    Coronary artery disease    a.) s/p 4v CABG in 2014   Diabetes mellitus without complication (HCC)    Diabetic neuropathy (HCC)    Diabetic peripheral neuropathy (HCC)    Diabetic ulcer of toe of left foot associated with diabetes mellitus of other type, limited to breakdown of skin (HCC) 12/21/2017   Diverticulosis    Gout    Gram-negative bacteremia 05/11/2023   Heart murmur    History of osteomyelitis 05/31/2015   Hypercholesteremia    Hyperlipidemia    Hypertension    Hypotension due to hypovolemia 08/17/2022   Infection of left foot 08/19/2022   MSSA bacteremia 02/28/2023   Open wound of left foot with complication 05/10/2023   Osteomyelitis (HCC) 05/31/2015   Peripheral neuropathy    Postural dizziness with presyncope 02/26/2023   S/P BKA (below knee amputation) unilateral, 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   S/P transmetatarsal amputation of foot, left (HCC) 08/29/2022   Sepsis (HCC) 08/17/2022   Sepsis (HCC) 05/10/2023   Status post amputation of toe of right foot (HCC) 11/01/2015   Tubular adenoma    Vitamin D  deficiency     Past Surgical History:  Procedure Laterality Date   ABDOMINAL AORTOGRAM W/LOWER EXTREMITY N/A 11/05/2023  Procedure: ABDOMINAL AORTOGRAM W/LOWER EXTREMITY;  Surgeon: Marea Selinda RAMAN, MD;  Location: [facility];  Service: Cardiovascular;  Laterality: N/A;   ACHILLES TENDON SURGERY Right 09/14/2023   Procedure: TENOTOMY, ACHILLES;  Surgeon: [name], DPM;  Location: [facility];  Service: Orthopedics/Podiatry;  Laterality: Right;   AMPUTATION Left 08/19/2022   Procedure: TRANSMETATARSAL AMPUTATION LEFT FOOT WITH IRRIGATION AND DEBRIDEMENT;  Surgeon: Lennie Barter, DPM;  Location: [facility];  Service:  Podiatry;  Laterality: Left;   AMPUTATION Left 05/16/2023   Procedure: AMPUTATION BELOW KNEE;  Surgeon: Marea Selinda RAMAN, MD;  Location: [facility];  Service: General;  Laterality: Left;   AMPUTATION Right 09/14/2023   Procedure: AMPUTATION, FOOT, RAY;  Surgeon: [name], DPM;  Location: [facility];  Service: Orthopedics/Podiatry;  Laterality: Right;   AMPUTATION TOE Right 06/01/2015   Procedure: AMPUTATION TOE;  Surgeon: Donnice Cory, DPM;  Location: [facility];  Service: Podiatry;  Laterality: Right;   AMPUTATION TOE Left 08/11/2022   Procedure: AMPUTATION TOE 2, 3, 4;  Surgeon: [name], DPM;  Location: [facility];  Service: Podiatry;  Laterality: Left;   CATARACT EXTRACTION, BILATERAL     CIRCUMCISION N/A 06/12/2022   Procedure: CIRCUMCISION ADULT;  Surgeon: Penne Knee, MD;  Location: [facility];  Service: Urology;  Laterality: N/A;   COLONOSCOPY WITH PROPOFOL  N/A 02/14/2016   Procedure: COLONOSCOPY WITH PROPOFOL ;  Surgeon: Gladis RAYMOND Mariner, MD;  Location: [facility];  Service: Endoscopy;  Laterality: N/A;   COLONOSCOPY WITH PROPOFOL  N/A 01/07/2019   Procedure: COLONOSCOPY WITH PROPOFOL ;  Surgeon: Mariner Gladis RAYMOND, MD;  Location: [facility]OSCOPY;  Service: Endoscopy;  Laterality: N/A;   CORONARY ARTERY BYPASS GRAFT N/A 08/2012   EXCISION PARTIAL PHALANX Right 06/01/2015   Procedure: EXCISION PARTIAL PHALANX /  BONE;  Surgeon: Donnice Cory, DPM;  Location: [facility];  Service: Podiatry;  Laterality: Right;   FLEXIBLE SIGMOIDOSCOPY N/A 05/29/2016   Procedure: FLEXIBLE SIGMOIDOSCOPY;  Surgeon: Gladis RAYMOND Mariner, MD;  Location: [facility] ENDOSCOPY;  Service: Endoscopy;  Laterality: N/A;   INCISION AND DRAINAGE Left 08/23/2022   Procedure: INCISION AND DRAINAGE;  Surgeon: [name], DPM;  Location: [facility];  Service: Podiatry;  Laterality: Left;   INCISION AND DRAINAGE OF WOUND Left 09/15/2022   Procedure: 11044 - DEBRIDE BONE and EXCISION IF 1ST METATARSAL BONE WITH  DELAY PRIMARY  CLOSURE;  Surgeon: [name], DPM;  Location: [facility];  Service: Podiatry;  Laterality: Left;   IRRIGATION AND DEBRIDEMENT FOOT Left 02/28/2023   Procedure: IRRIGATION AND DEBRIDEMENT FOOT;  Surgeon: [name], DPM;  Location: [facility];  Service: Orthopedics/Podiatry;  Laterality: Left;   KNEE ARTHROSCOPY Left    LOWER EXTREMITY ANGIOGRAPHY Left 08/22/2022   Procedure: Lower Extremity Angiography;  Surgeon: Jama Cordella MATSU, MD;  Location: [facility];  Service: Cardiovascular;  Laterality: Left;   LOWER EXTREMITY ANGIOGRAPHY Left 05/11/2023   Procedure: Lower Extremity Angiography;  Surgeon: Marea Selinda RAMAN, MD;  Location: [facility];  Service: Cardiovascular;  Laterality: Left;   LOWER EXTREMITY ANGIOGRAPHY Right 09/11/2023   Procedure: Lower Extremity Angiography;  Surgeon: Jama Cordella MATSU, MD;  Location: [facility];  Service: Cardiovascular;  Laterality: Right;   LOWER EXTREMITY INTERVENTION Right 09/11/2023   Procedure: LOWER EXTREMITY INTERVENTION;  Surgeon: Jama Cordella MATSU, MD;  Location: [facility];  Service: Cardiovascular;  Laterality: Right;   TEE WITHOUT CARDIOVERSION N/A 03/01/2023   Procedure: TRANSESOPHAGEAL ECHOCARDIOGRAM;  Surgeon: Wilburn Keller BROCKS, MD;  Location: [facility];  Service: Cardiovascular;  Laterality: N/A;   TRANSMETATARSAL AMPUTATION Right 11/02/2023   Procedure: AMPUTATION, FOOT, TRANSMETATARSAL;  Surgeon: [name], DPM;  Location: [facility];  Service: Orthopedics/Podiatry;  Laterality: Right;   TRANSMETATARSAL AMPUTATION Right 12/21/2023   Procedure: REVISION, AMPUTATION, FOOT, TRANSMETATARSAL;  Surgeon: [name], DPM;  Location: [facility];  Service: Orthopedics/Podiatry;  Laterality: Right;   WOUND DEBRIDEMENT Left 09/15/2022   Procedure: 11043 - DEBRIDE SKIN. MUSCLE FASCIA;  Surgeon: [name], DPM;  Location: [facility];  Service: Podiatry;  Laterality: Left;    Social History   Socioeconomic History    Marital status: Divorced    Spouse name: Ashley,Angela C   Number of children: Not on file   Years of education: Not on file   Highest education level: Not on file  Occupational History   Not on file  Tobacco Use   Smoking status: Former    Types: Cigars   Smokeless tobacco: Never  Vaping Use   Vaping status: Never Used  Substance and Sexual Activity   Alcohol use: No   Drug use: No   Sexual activity: Never  Other Topics Concern   Not on file  Social History Narrative   Patient currently resides at    [employer]   [address]  [place], [place]  [phone]      Patient is legally separated and lives at home by himself. His ex -wife is his HCPOA. Neighbors and friends are his support system. He used to work for [employer]    Social Drivers of Corporate investment banker Strain: Low Risk  (12/13/2022)   Received from [facility]   Overall Financial Resource Strain (CARDIA)    Difficulty of Paying Living Expenses: Not hard at all  Food Insecurity: No Food Insecurity (11/01/2023)   Hunger Vital Sign    Worried About Running Out of Food in the Last Year: Never true    Ran Out of Food in the Last Year: Never true  Transportation Needs: No Transportation Needs (11/01/2023)   PRAPARE - Administrator, Civil Service (Medical): No    Lack of Transportation (Non-Medical): No  Physical Activity: Not on file  Stress: Not on file  Social Connections: Moderately Isolated (11/01/2023)   Social Connection and Isolation Panel    Frequency of Communication with Friends and Family: More than three times a week    Frequency of Social Gatherings with Friends and Family: Twice a week    Attends Religious Services: More than 4 times per year    Active Member of [employer] or Organizations: No    Attends Banker Meetings: Never    Marital Status: Divorced  Catering manager Violence: Not At Risk (11/01/2023)   Humiliation, Afraid,  Rape, and Kick questionnaire    Fear of Current or Ex-Partner: No    Emotionally Abused: No    Physically Abused: No    Sexually Abused: No    Family History  Problem Relation Age of Onset   Diabetes Mellitus II Mother    CAD Mother    No Known Allergies I? Current Outpatient Medications  Medication Sig Dispense Refill   amoxicillin -clavulanate (AUGMENTIN ) 875-125 MG tablet Take 1 tablet by mouth 2 (two) times daily. 20 tablet 0   chlorhexidine  (HIBICLENS ) 4 % external liquid Apply 15 mLs (1 Application total) topically as directed for 30 doses. Use as directed daily for 5 days every other week for 6 weeks. 946 mL  1   chlorhexidine  (HIBICLENS ) 4 % external liquid Apply 15 mLs (1 Application total) topically as directed for 30 doses. Use as directed daily for 5 days every other week for 6 weeks. 946 mL 1   clopidogrel  (PLAVIX ) 75 MG tablet TAKE 1 TABLET BY MOUTH EVERY DAY WITH BREAKFAST 30 tablet 5   Continuous Glucose Sensor (FREESTYLE LIBRE 2 SENSOR) MISC      cyanocobalamin  (VITAMIN B12) 1000 MCG tablet Take 1 tablet (1,000 mcg total) by mouth daily.     docusate sodium  (COLACE) 100 MG capsule Take 1 capsule (100 mg total) by mouth 2 (two) times daily. 100 capsule 0   Dulaglutide  (TRULICITY ) 0.75 MG/0.5ML SOAJ Inject 0.75 mg into the skin once a week. Every Monday     ELIQUIS  5 MG TABS tablet TAKE 1 TABLET(5 MG) BY MOUTH TWICE DAILY 60 tablet 2   gabapentin  (NEURONTIN ) 300 MG capsule Take 1 capsule (300 mg total) by mouth 3 (three) times daily. 90 capsule 0   glucose blood (ONETOUCH ULTRA) test strip Use 3 (three) times daily     insulin  glargine (LANTUS ) 100 UNIT/ML Solostar Pen Inject 28 Units into the skin at bedtime. (Patient taking differently: Inject 28 Units into the skin daily.)     Insulin  Pen Needle (FIFTY50 PEN NEEDLES) 32G X 4 MM MISC USE 4 TIMES DAILY     iron  polysaccharides (NIFEREX) 150 MG capsule Take 1 capsule (150 mg total) by mouth daily.     metFORMIN   (GLUCOPHAGE -XR) 500 MG 24 hr tablet Take 1,000 mg by mouth 2 (two) times daily with a meal.     metoprolol  tartrate (LOPRESSOR ) 25 MG tablet Take 1 tablet (25 mg total) by mouth 2 (two) times daily. (Patient taking differently: Take 12.5 mg by mouth 2 (two) times daily.) 60 tablet 0   mupirocin  ointment (BACTROBAN ) 2 % Place 1 Application into the nose 2 (two) times daily for 60 doses. Use as directed 2 times daily for 5 days every other week for 6 weeks. 60 g 0   oxyCODONE  (OXY IR/ROXICODONE ) 5 MG immediate release tablet Take 1 tablet (5 mg total) by mouth every 6 (six) hours as needed for moderate pain (pain score 4-6). SNF use only.  Refills per SNF providers 12 tablet 0   OZEMPIC, 0.25 OR 0.5 MG/DOSE, 2 MG/3ML SOPN Inject 0.75 mLs into the skin once a week.     pravastatin  (PRAVACHOL ) 80 MG tablet Take 80 mg by mouth daily.     Semaglutide, 1 MG/DOSE, (OZEMPIC, 1 MG/DOSE,) 4 MG/3ML SOPN Inject 1 mg into the skin every 7 (seven) days.     traZODone  (DESYREL ) 50 MG tablet Take 50 mg by mouth at bedtime.     No current facility-administered medications for this visit.     Abtx:  Anti-infectives (From admission, onward)    None       REVIEW OF SYSTEMS:  Const: negative fever, negative chills, negative weight loss Eyes: negative diplopia or visual changes, negative eye pain ENT: negative coryza, negative sore throat Resp: negative cough, hemoptysis, dyspnea Cards: negative for chest pain, palpitations, lower extremity edema GU: negative for frequency, dysuria and hematuria GI: Negative for abdominal pain, diarrhea, bleeding, constipation Skin: negative for rash and pruritus Heme: negative for easy bruising and gum/nose bleeding MS: as above Neurolo:negative for headaches, dizziness, vertigo, memory problems  Psych: pt upset that the foot is not healing and does not want to get IV antibiotics Endocrine: , diabetes Allergy/Immunology- negative for any  medication or food  allergies ?  Objective:  VITALS:  BP 117/75   Pulse 82   Temp (!) 97.3 F (36.3 C) (Temporal)   SpO2 99%  LDA Rt PICC PHYSICAL EXAM:  General: Alert, cooperative, no distress, Lungs: Clear to auscultation bilaterally. No Wheezing or Rhonchi. No rales. Heart: Regular rate and rhythm, no murmur, rub or gallop. Abdomen: did not examine Extremities: rt foot TMA site- is so much improved except at the lateral end     12/13/23   11/29/23    11/20/23          Left leg BKA Prosthetic leg Skin: No rashes or lesions. Or bruising Lymph: Cervical, supraclavicular normal. Neurologic: Grossly non-focal Pertinent Labs Lab Results CBC    Component Value Date/Time   WBC 10.6 (H) 12/21/2023 1043   RBC 3.67 (L) 12/21/2023 1043   HGB 10.4 (L) 12/21/2023 1043   HCT 32.0 (L) 12/21/2023 1043   PLT 375 12/21/2023 1043   MCV 87.2 12/21/2023 1043   MCH 28.3 12/21/2023 1043   MCHC 32.5 12/21/2023 1043   RDW 13.2 12/21/2023 1043   LYMPHSABS 0.9 11/07/2023 0425   MONOABS 0.6 11/07/2023 0425   EOSABS 0.1 11/07/2023 0425   BASOSABS 0.0 11/07/2023 0425       Latest Ref Rng & Units 11/07/2023    4:25 AM 11/06/2023    5:37 AM 11/05/2023    4:30 AM  CMP  Glucose 70 - 99 mg/dL 831  851  855   BUN 8 - 23 mg/dL 18  19  20    Creatinine 0.61 - 1.24 mg/dL 8.73  8.73  8.76   Sodium 135 - 145 mmol/L 137  137  136   Potassium 3.5 - 5.1 mmol/L 4.0  4.6  4.1   Chloride 98 - 111 mmol/L 106  107  105   CO2 22 - 32 mmol/L 24  25  24    Calcium 8.9 - 10.3 mg/dL 8.5  8.5  8.6      ? Impression/Recommendation ?Diabetic foot infection with peripheral artery disease involving the right foot at the site of prior amputation of the great toe Underwent TMA on 11/02/2023 Culture MRSA / enterococcus fecalis Bone pathology acute osteo but margin clear. Completed 6 weeks of IV daptomycin  arund 12/21/23 Some ischemic necrosis of the skin at the surgical site There is some dehiscence at the surgical site  on the lateral margin Further debridement on 12/21/23- Enterobacter, pseudeschricia  and granulocutella- on levaquin and augmentin  01/03/24 culture sent by Dr.Fowler- enterobacter- susceptibility pending- will need likely IV antibiotic if there is no Po option   Last ESR improved to 36 CRP 1.3   PAD Followed by vein and vascular On 11/05/2023 underwent angio and had mechanical thrombectomy of the right anterior tibial artery, balloon angioplasty of the right anterior tibial artery and stent placement of the same.  Angioplasty of the right distal popliteal artery, tibioperoneal trunk and stent placement in the right tibioperoneal trunk   History of left BKA    CAD Status post CABG   A-fib on  Eliquis    ?  ________________________________________________ Discussed with patient, and his friend-   Pt was upset during the visit Sent him to Dr.Fowlers office to discuss further plan with him-including the role of Hyper baric oxygen

## 2024-01-10 ENCOUNTER — Other Ambulatory Visit: Payer: Self-pay

## 2024-01-10 ENCOUNTER — Inpatient Hospital Stay: Admit: 2024-01-10 | Admitting: Hospitalist

## 2024-01-10 ENCOUNTER — Ambulatory Visit
Admission: RE | Admit: 2024-01-10 | Discharge: 2024-01-10 | Disposition: A | Discharge status: Home / Self Care | Source: Ambulatory Visit | Attending: Infectious Diseases | Admitting: Infectious Diseases

## 2024-01-10 ENCOUNTER — Inpatient Hospital Stay: Admission: EM | Admit: 2024-01-10 | Discharge: 2024-01-16 | DRG: 504 | Disposition: A | Discharge status: Home Health

## 2024-01-10 ENCOUNTER — Encounter (HOSPITAL_COMMUNITY): Payer: Self-pay

## 2024-01-10 DIAGNOSIS — Z7901 Long term (current) use of anticoagulants: Secondary | ICD-10-CM | POA: Diagnosis not present

## 2024-01-10 DIAGNOSIS — M109 Gout, unspecified: Secondary | ICD-10-CM | POA: Diagnosis present

## 2024-01-10 DIAGNOSIS — I4891 Unspecified atrial fibrillation: Secondary | ICD-10-CM | POA: Diagnosis not present

## 2024-01-10 DIAGNOSIS — Z89412 Acquired absence of left great toe: Secondary | ICD-10-CM | POA: Diagnosis not present

## 2024-01-10 DIAGNOSIS — E7800 Pure hypercholesterolemia, unspecified: Secondary | ICD-10-CM | POA: Diagnosis present

## 2024-01-10 DIAGNOSIS — Z9842 Cataract extraction status, left eye: Secondary | ICD-10-CM

## 2024-01-10 DIAGNOSIS — L089 Local infection of the skin and subcutaneous tissue, unspecified: Secondary | ICD-10-CM | POA: Diagnosis not present

## 2024-01-10 DIAGNOSIS — E11621 Type 2 diabetes mellitus with foot ulcer: Secondary | ICD-10-CM | POA: Diagnosis present

## 2024-01-10 DIAGNOSIS — Z89411 Acquired absence of right great toe: Secondary | ICD-10-CM

## 2024-01-10 DIAGNOSIS — Z7985 Long-term (current) use of injectable non-insulin antidiabetic drugs: Secondary | ICD-10-CM

## 2024-01-10 DIAGNOSIS — I129 Hypertensive chronic kidney disease with stage 1 through stage 4 chronic kidney disease, or unspecified chronic kidney disease: Secondary | ICD-10-CM | POA: Diagnosis present

## 2024-01-10 DIAGNOSIS — I739 Peripheral vascular disease, unspecified: Secondary | ICD-10-CM | POA: Diagnosis present

## 2024-01-10 DIAGNOSIS — B9689 Other specified bacterial agents as the cause of diseases classified elsewhere: Secondary | ICD-10-CM | POA: Diagnosis present

## 2024-01-10 DIAGNOSIS — Z89421 Acquired absence of other right toe(s): Secondary | ICD-10-CM | POA: Diagnosis not present

## 2024-01-10 DIAGNOSIS — Z7982 Long term (current) use of aspirin: Secondary | ICD-10-CM | POA: Diagnosis not present

## 2024-01-10 DIAGNOSIS — G8929 Other chronic pain: Secondary | ICD-10-CM | POA: Diagnosis present

## 2024-01-10 DIAGNOSIS — Z89422 Acquired absence of other left toe(s): Secondary | ICD-10-CM

## 2024-01-10 DIAGNOSIS — E1142 Type 2 diabetes mellitus with diabetic polyneuropathy: Secondary | ICD-10-CM | POA: Diagnosis present

## 2024-01-10 DIAGNOSIS — Z9841 Cataract extraction status, right eye: Secondary | ICD-10-CM

## 2024-01-10 DIAGNOSIS — Z79899 Other long term (current) drug therapy: Secondary | ICD-10-CM

## 2024-01-10 DIAGNOSIS — Z9889 Other specified postprocedural states: Secondary | ICD-10-CM | POA: Diagnosis not present

## 2024-01-10 DIAGNOSIS — E1151 Type 2 diabetes mellitus with diabetic peripheral angiopathy without gangrene: Secondary | ICD-10-CM | POA: Diagnosis present

## 2024-01-10 DIAGNOSIS — Z23 Encounter for immunization: Secondary | ICD-10-CM

## 2024-01-10 DIAGNOSIS — D61818 Other pancytopenia: Secondary | ICD-10-CM | POA: Diagnosis present

## 2024-01-10 DIAGNOSIS — I2510 Atherosclerotic heart disease of native coronary artery without angina pectoris: Secondary | ICD-10-CM | POA: Diagnosis present

## 2024-01-10 DIAGNOSIS — Z95828 Presence of other vascular implants and grafts: Secondary | ICD-10-CM | POA: Diagnosis not present

## 2024-01-10 DIAGNOSIS — Z951 Presence of aortocoronary bypass graft: Secondary | ICD-10-CM | POA: Diagnosis not present

## 2024-01-10 DIAGNOSIS — T8789 Other complications of amputation stump: Secondary | ICD-10-CM | POA: Diagnosis not present

## 2024-01-10 DIAGNOSIS — E11628 Type 2 diabetes mellitus with other skin complications: Secondary | ICD-10-CM | POA: Diagnosis present

## 2024-01-10 DIAGNOSIS — N1831 Chronic kidney disease, stage 3a: Secondary | ICD-10-CM | POA: Diagnosis present

## 2024-01-10 DIAGNOSIS — Z22350 Carrier of carbapenem-resistant enterobacterales: Secondary | ICD-10-CM | POA: Diagnosis not present

## 2024-01-10 DIAGNOSIS — Z7984 Long term (current) use of oral hypoglycemic drugs: Secondary | ICD-10-CM

## 2024-01-10 DIAGNOSIS — E1122 Type 2 diabetes mellitus with diabetic chronic kidney disease: Secondary | ICD-10-CM | POA: Diagnosis present

## 2024-01-10 DIAGNOSIS — R5381 Other malaise: Secondary | ICD-10-CM | POA: Diagnosis present

## 2024-01-10 DIAGNOSIS — Z833 Family history of diabetes mellitus: Secondary | ICD-10-CM

## 2024-01-10 DIAGNOSIS — Z87891 Personal history of nicotine dependence: Secondary | ICD-10-CM | POA: Diagnosis not present

## 2024-01-10 DIAGNOSIS — Z635 Disruption of family by separation and divorce: Secondary | ICD-10-CM

## 2024-01-10 DIAGNOSIS — Z89512 Acquired absence of left leg below knee: Secondary | ICD-10-CM

## 2024-01-10 DIAGNOSIS — T8743 Infection of amputation stump, right lower extremity: Secondary | ICD-10-CM | POA: Diagnosis present

## 2024-01-10 DIAGNOSIS — I7025 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: Secondary | ICD-10-CM | POA: Diagnosis present

## 2024-01-10 DIAGNOSIS — Z7902 Long term (current) use of antithrombotics/antiplatelets: Secondary | ICD-10-CM

## 2024-01-10 DIAGNOSIS — Z794 Long term (current) use of insulin: Secondary | ICD-10-CM

## 2024-01-10 DIAGNOSIS — Z8249 Family history of ischemic heart disease and other diseases of the circulatory system: Secondary | ICD-10-CM

## 2024-01-10 DIAGNOSIS — Z1620 Resistance to unspecified antibiotic: Secondary | ICD-10-CM | POA: Diagnosis not present

## 2024-01-10 DIAGNOSIS — I10 Essential (primary) hypertension: Secondary | ICD-10-CM | POA: Diagnosis present

## 2024-01-10 DIAGNOSIS — E1140 Type 2 diabetes mellitus with diabetic neuropathy, unspecified: Secondary | ICD-10-CM | POA: Diagnosis present

## 2024-01-10 DIAGNOSIS — I480 Paroxysmal atrial fibrillation: Secondary | ICD-10-CM | POA: Diagnosis present

## 2024-01-10 DIAGNOSIS — M86171 Other acute osteomyelitis, right ankle and foot: Secondary | ICD-10-CM | POA: Diagnosis present

## 2024-01-10 DIAGNOSIS — E1169 Type 2 diabetes mellitus with other specified complication: Secondary | ICD-10-CM | POA: Diagnosis present

## 2024-01-10 DIAGNOSIS — E538 Deficiency of other specified B group vitamins: Secondary | ICD-10-CM | POA: Diagnosis present

## 2024-01-10 DIAGNOSIS — Y848 Other medical procedures as the cause of abnormal reaction of the patient, or of later complication, without mention of misadventure at the time of the procedure: Secondary | ICD-10-CM | POA: Diagnosis present

## 2024-01-10 DIAGNOSIS — A498 Other bacterial infections of unspecified site: Secondary | ICD-10-CM

## 2024-01-10 DIAGNOSIS — I70235 Atherosclerosis of native arteries of right leg with ulceration of other part of foot: Secondary | ICD-10-CM | POA: Diagnosis not present

## 2024-01-10 DIAGNOSIS — M869 Osteomyelitis, unspecified: Secondary | ICD-10-CM | POA: Diagnosis not present

## 2024-01-10 DIAGNOSIS — L97519 Non-pressure chronic ulcer of other part of right foot with unspecified severity: Secondary | ICD-10-CM | POA: Diagnosis not present

## 2024-01-10 DIAGNOSIS — I70201 Unspecified atherosclerosis of native arteries of extremities, right leg: Secondary | ICD-10-CM | POA: Diagnosis not present

## 2024-01-10 LAB — CBC WITH DIFFERENTIAL/PLATELET
Abs Immature Granulocytes: 0.03 K/uL (ref 0.00–0.07)
Basophils Absolute: 0.1 K/uL (ref 0.0–0.1)
Basophils Relative: 1 %
Eosinophils Absolute: 0.3 K/uL (ref 0.0–0.5)
Eosinophils Relative: 5 %
HCT: 36.5 % — ABNORMAL LOW (ref 39.0–52.0)
Hemoglobin: 11.6 g/dL — ABNORMAL LOW (ref 13.0–17.0)
Immature Granulocytes: 1 %
Lymphocytes Relative: 18 %
Lymphs Abs: 1.2 K/uL (ref 0.7–4.0)
MCH: 28.5 pg (ref 26.0–34.0)
MCHC: 31.8 g/dL (ref 30.0–36.0)
MCV: 89.7 fL (ref 80.0–100.0)
Monocytes Absolute: 0.4 K/uL (ref 0.1–1.0)
Monocytes Relative: 6 %
Neutro Abs: 4.5 K/uL (ref 1.7–7.7)
Neutrophils Relative %: 69 %
Platelets: 193 K/uL (ref 150–400)
RBC: 4.07 MIL/uL — ABNORMAL LOW (ref 4.22–5.81)
RDW: 13.6 % (ref 11.5–15.5)
WBC: 6.4 K/uL (ref 4.0–10.5)
nRBC: 0 % (ref 0.0–0.2)

## 2024-01-10 LAB — BASIC METABOLIC PANEL WITH GFR
Anion gap: 10 (ref 5–15)
BUN: 12 mg/dL (ref 8–23)
CO2: 23 mmol/L (ref 22–32)
Calcium: 8.9 mg/dL (ref 8.9–10.3)
Chloride: 107 mmol/L (ref 98–111)
Creatinine, Ser: 1.35 mg/dL — ABNORMAL HIGH (ref 0.61–1.24)
GFR, Estimated: 55 mL/min — ABNORMAL LOW (ref >60)
Glucose, Bld: 147 mg/dL — ABNORMAL HIGH (ref 70–99)
Potassium: 4.1 mmol/L (ref 3.5–5.1)
Sodium: 140 mmol/L (ref 135–145)

## 2024-01-10 LAB — LACTIC ACID, PLASMA
Lactic Acid, Venous: 1.4 mmol/L (ref 0.5–1.9)
Lactic Acid, Venous: 1.4 mmol/L (ref 0.5–1.9)

## 2024-01-10 LAB — MAGNESIUM: Magnesium: 2 mg/dL (ref 1.7–2.4)

## 2024-01-10 LAB — GLUCOSE, CAPILLARY: Glucose-Capillary: 191 mg/dL — ABNORMAL HIGH (ref 70–99)

## 2024-01-10 MED ORDER — MEDIHONEY WOUND/BURN DRESSING EX PSTE
1.0000 | PASTE | Freq: Every day | CUTANEOUS | Status: DC
  Administered 2024-01-13 – 2024-01-16 (×3): 1 via TOPICAL
  Filled 2024-01-10 (×2): qty 44

## 2024-01-10 MED ORDER — VANCOMYCIN HCL 2000 MG/400ML IV SOLN
2000.0000 mg | Freq: Once | INTRAVENOUS | Status: AC
  Administered 2024-01-10: 2000 mg via INTRAVENOUS
  Filled 2024-01-10: qty 400

## 2024-01-10 MED ORDER — ONDANSETRON HCL 4 MG/2ML IJ SOLN: 4.0000 mg | Freq: Four times a day (QID) | INTRAMUSCULAR | Status: DC | PRN

## 2024-01-10 MED ORDER — VANCOMYCIN HCL 750 MG/150ML IV SOLN
750.0000 mg | Freq: Two times a day (BID) | INTRAVENOUS | Status: DC
  Administered 2024-01-11 – 2024-01-13 (×6): 750 mg via INTRAVENOUS
  Filled 2024-01-10 (×6): qty 150

## 2024-01-10 MED ORDER — INSULIN ASPART 100 UNIT/ML IJ SOLN
0.0000 [IU] | Freq: Three times a day (TID) | INTRAMUSCULAR | Status: DC
  Administered 2024-01-10 – 2024-01-11 (×2): 2 [IU] via SUBCUTANEOUS
  Administered 2024-01-11 – 2024-01-12 (×2): 1 [IU] via SUBCUTANEOUS
  Administered 2024-01-12: 2 [IU] via SUBCUTANEOUS
  Administered 2024-01-12 – 2024-01-14 (×4): 1 [IU] via SUBCUTANEOUS
  Administered 2024-01-16: 2 [IU] via SUBCUTANEOUS
  Filled 2024-01-10 (×10): qty 1

## 2024-01-10 MED ORDER — PNEUMOCOCCAL 20-VAL CONJ VACC 0.5 ML IM SUSY: 0.5000 mL | PREFILLED_SYRINGE | INTRAMUSCULAR | Status: DC

## 2024-01-10 MED ORDER — ONDANSETRON HCL 4 MG PO TABS: 4.0000 mg | ORAL_TABLET | Freq: Four times a day (QID) | ORAL | Status: DC | PRN

## 2024-01-10 MED ORDER — INFLUENZA VAC SPLIT HIGH-DOSE 0.5 ML IM SUSY
0.5000 mL | PREFILLED_SYRINGE | INTRAMUSCULAR | Status: AC
  Administered 2024-01-11: 0.5 mL via INTRAMUSCULAR
  Filled 2024-01-10: qty 0.5

## 2024-01-10 MED ORDER — ACETAMINOPHEN 650 MG RE SUPP: 650.0000 mg | Freq: Four times a day (QID) | RECTAL | Status: DC | PRN

## 2024-01-10 MED ORDER — BISACODYL 10 MG RE SUPP: 10.0000 mg | Freq: Every day | RECTAL | Status: DC | PRN

## 2024-01-10 MED ORDER — ACETAMINOPHEN 325 MG PO TABS: 650.0000 mg | ORAL_TABLET | Freq: Four times a day (QID) | ORAL | Status: DC | PRN

## 2024-01-10 MED ORDER — POLYETHYLENE GLYCOL 3350 17 G PO PACK: 17.0000 g | PACK | Freq: Every day | ORAL | Status: DC | PRN

## 2024-01-10 MED ORDER — POVIDONE-IODINE 10 % EX SWAB: 2.0000 | Freq: Once | CUTANEOUS | Status: DC

## 2024-01-10 MED ORDER — PIPERACILLIN-TAZOBACTAM 3.375 G IVPB 30 MIN
3.3750 g | Freq: Once | INTRAVENOUS | Status: AC
  Administered 2024-01-10: 3.375 g via INTRAVENOUS
  Filled 2024-01-10 (×2): qty 50

## 2024-01-10 MED ORDER — CHLORHEXIDINE GLUCONATE 4 % EX SOLN
60.0000 mL | Freq: Once | CUTANEOUS | Status: AC
  Administered 2024-01-11: 4 via TOPICAL

## 2024-01-10 MED ORDER — SODIUM CHLORIDE 0.9 % IV SOLN
2.0000 g | Freq: Two times a day (BID) | INTRAVENOUS | Status: DC
  Administered 2024-01-10 – 2024-01-12 (×4): 2 g via INTRAVENOUS
  Filled 2024-01-10 (×5): qty 12.5

## 2024-01-10 NOTE — Consult Note (Addendum)
 WOC Nurse Consult Note: Reason for Consult: Requested to assess a Diabetic foot ulcer. Podiatry is following.  Wound type: DFU. transmetatarsal amputation; multidrug-resistant Enterobacter infection.  Pressure Injury POA: NA Measurement: Suture present. Amputation of all metatarsals. Draining pus on the distal lateral Right. Malleolus wound, 100% dark eschar, aprox. 1.5 x 1 cm. Wound bed: sutures, 100% yellow at the dehiscence. Drainage (amount, consistency, odor) Purulent. Notes: possible osteomyelitis. Periwound: intact, suture, erythema. Dressing procedure/placement/frequency: Cleanse with saline, pat dry the peri-wound skin. Apply Medihoney to the wound beds (dehiscence and malleolus), cover with non adherent silicone Mepitel [phone]. wrap with Kerlix and ACE wrap. Change Daily or PRN.  Addedum: If confirmed an osteomyelitis, the dressing applied will be palliative. The pt will be benefit with systemic treatment and possible debridement.   WOC team will not plan to follow further. Please reconsult if further assistance is needed. Thank-you,  Lela Holm RN, CNS, [employer], MSN.  (Phone [phone])

## 2024-01-10 NOTE — Progress Notes (Signed)
 Pharmacy Antibiotic Note  Andrew Harding is a 73 y.o. male admitted on 01/10/2024 with osteomyelitis.  Pharmacy has been consulted for vancomycin  dosing.  Plan: Vancomycin  load of 2000 mg IV x1 given in ED Start vancomycin  750 mg IV every 12 hours (eAUC 484, Scr 1.35, IBW used, Vd 0.72 L/kg) Continue cefepime  2 g IV every 12 per MD Continue to monitor renal function, clinical status, culture data, and antibiotic LOT  Height: 6' 3 (190.5 cm) Weight: 82.6 kg (182 lb) IBW/kg (Calculated) : 84.5  Temp (24hrs), Avg:97.5 F (36.4 C), Min:97.5 F (36.4 C), Max:97.5 F (36.4 C)  Recent Labs  Lab 01/10/24 1218 01/10/24 1219  WBC 6.4  --   CREATININE 1.35*  --   LATICACIDVEN  --  1.4    Estimated Creatinine Clearance: 56.1 mL/min (A) (by C-G formula based on SCr of 1.35 mg/dL (H)).    No Known Allergies  Antimicrobials this admission: Vancomycin  9/11 >>  Cefepime  9/11 >>  Zosyn  9/11 x1  Dose adjustments this admission: N/A  Microbiology results: 9/11 BCx: pending  Thank you for involving pharmacy in this patient's care.   Damien Napoleon, PharmD Clinical Pharmacist 01/10/2024 1:35 PM

## 2024-01-10 NOTE — H&P (Signed)
 History and Physical    [name] FMW:[id number] DOB: 08-Sep-1950 DOA: 01/10/2024 DOS: the patient was seen and examined on 01/10/2024 PCP: Fernande Ophelia [name], MD  Patient coming from: Podiatrist's office.  Chief Complaint:  Chief Complaint  Patient presents with   Wound Infection   HPI: Andrew Harding is a 73 y.o. male with medical history significant for  DM II, peripheral neuropathy,CAD, s/p CABG, HTN, Atrial fibrillation with eliquis  for stroke prophylaxis, s/p right great toe amputation.  PAD s/p stent to rt ant tibial artery, s/p percutaneous angioplasty of the right mid popliteal artery and the right tibioperoneal trunk followed by TMA on the right on 11/02/23, and left BKA. Pathology from TMA was positive for osteomyelitis but margin was clear of osteo. Cultures from surgery grew out MRSA and enterococcus fecalis. He received treatment with 4 weeks of IV daptomycin  which was extended by two more weeks to complete 6 weeks of therapy on 12/14/2023.  The patient presented to podiatrists office on the morning of 01/10/1024 with complaints of worsening infection to his right foot. He was found to have drainage to the distal lateral aspect of the right foot from the most recent transmetatarsal amputation. MDRO enterobacter has been grown from this drainage. Susceptibilities demonstrated sensitivity to amikacin, aztreonam, cefepime , imipenem and meropenem . There was frank purulence from the wound today.   PICC line was placed prior to presentation in ED in anticipation of another course of outpatient antibiotics. He was sent to the ED for admission. I & D by podiatry has been scheduled for 01/11/2024.  The patient denies pain (he has severe neuropathy in that limb), fevers, chills, nausea, vomiting, diarrhea, constipation, cough, chest pain, neurological deficits, confusion, headaches.  In the ED the patient is found to have: Vitals:   01/10/24 1109  BP: 119/76  Pulse: 77  Resp: 16  Temp:  (!) 97.5 F (36.4 C)  SpO2: 100%    Review of Systems: As mentioned in the history of present illness. All other systems reviewed and are negative. 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) 09/03/2023   Atrial fibrillation with RVR (HCC) 08/19/2022   Bladder neck obstruction    Cellulitis 08/17/2022   Chronic kidney disease    Coronary artery disease    a.) s/p 4v CABG in 2014   Diabetes mellitus without complication (HCC)    Diabetic neuropathy (HCC)    Diabetic peripheral neuropathy (HCC)    Diabetic ulcer of toe of left foot associated with diabetes mellitus of other type, limited to breakdown of skin (HCC) 12/21/2017   Diverticulosis    Gout    Gram-negative bacteremia 05/11/2023   Heart murmur    History of osteomyelitis 05/31/2015   Hypercholesteremia    Hyperlipidemia    Hypertension    Hypotension due to hypovolemia 08/17/2022   Infection of left foot 08/19/2022   MSSA bacteremia 02/28/2023   Open wound of left foot with complication 05/10/2023   Osteomyelitis (HCC) 05/31/2015   Peripheral neuropathy    Postural dizziness with presyncope 02/26/2023   S/P BKA (below knee amputation) unilateral, 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   S/P transmetatarsal amputation of foot, left (HCC) 08/29/2022   Sepsis (HCC) 08/17/2022   Sepsis (HCC) 05/10/2023   Status post amputation of toe of  right foot (HCC) 11/01/2015   Tubular adenoma    Vitamin D  deficiency    Past Surgical History:  Procedure Laterality Date   ABDOMINAL AORTOGRAM W/LOWER EXTREMITY N/A 11/05/2023   Procedure: ABDOMINAL AORTOGRAM W/LOWER EXTREMITY;  Surgeon: Marea Selinda RAMAN, MD;  Location: [facility];  Service: Cardiovascular;  Laterality: N/A;   ACHILLES TENDON SURGERY Right 09/14/2023   Procedure: TENOTOMY,  ACHILLES;  Surgeon: [name], DPM;  Location: [facility];  Service: Orthopedics/Podiatry;  Laterality: Right;   AMPUTATION Left 08/19/2022   Procedure: TRANSMETATARSAL AMPUTATION LEFT FOOT WITH IRRIGATION AND DEBRIDEMENT;  Surgeon: Lennie Barter, DPM;  Location: [facility];  Service: Podiatry;  Laterality: Left;   AMPUTATION Left 05/16/2023   Procedure: AMPUTATION BELOW KNEE;  Surgeon: Marea Selinda RAMAN, MD;  Location: [facility];  Service: General;  Laterality: Left;   AMPUTATION Right 09/14/2023   Procedure: AMPUTATION, FOOT, RAY;  Surgeon: [name], DPM;  Location: [facility];  Service: Orthopedics/Podiatry;  Laterality: Right;   AMPUTATION TOE Right 06/01/2015   Procedure: AMPUTATION TOE;  Surgeon: Donnice Cory, DPM;  Location: [facility];  Service: Podiatry;  Laterality: Right;   AMPUTATION TOE Left 08/11/2022   Procedure: AMPUTATION TOE 2, 3, 4;  Surgeon: [name], DPM;  Location: [facility];  Service: Podiatry;  Laterality: Left;   CATARACT EXTRACTION, BILATERAL     CIRCUMCISION N/A 06/12/2022   Procedure: CIRCUMCISION ADULT;  Surgeon: Penne Knee, MD;  Location: [facility];  Service: Urology;  Laterality: N/A;   COLONOSCOPY WITH PROPOFOL  N/A 02/14/2016   Procedure: COLONOSCOPY WITH PROPOFOL ;  Surgeon: Gladis RAYMOND Mariner, MD;  Location: [facility];  Service: Endoscopy;  Laterality: N/A;   COLONOSCOPY WITH PROPOFOL  N/A 01/07/2019   Procedure: COLONOSCOPY WITH PROPOFOL ;  Surgeon: Mariner Gladis RAYMOND, MD;  Location: [facility];  Service: Endoscopy;  Laterality: N/A;   CORONARY ARTERY BYPASS GRAFT N/A 08/2012   EXCISION PARTIAL PHALANX Right 06/01/2015   Procedure: EXCISION PARTIAL PHALANX /  BONE;  Surgeon: Donnice Cory, DPM;  Location: [facility];  Service: Podiatry;  Laterality: Right;   FLEXIBLE SIGMOIDOSCOPY N/A 05/29/2016   Procedure: FLEXIBLE SIGMOIDOSCOPY;  Surgeon: Gladis RAYMOND Mariner, MD;  Location: [facility];  Service: Endoscopy;  Laterality: N/A;   INCISION AND  DRAINAGE Left 08/23/2022   Procedure: INCISION AND DRAINAGE;  Surgeon: [name], DPM;  Location: [facility];  Service: Podiatry;  Laterality: Left;   INCISION AND DRAINAGE OF WOUND Left 09/15/2022   Procedure: 11044 - DEBRIDE BONE and EXCISION IF 1ST METATARSAL BONE WITH  DELAY PRIMARY CLOSURE;  Surgeon: [name], DPM;  Location: [facility];  Service: Podiatry;  Laterality: Left;   IRRIGATION AND DEBRIDEMENT FOOT Left 02/28/2023   Procedure: IRRIGATION AND DEBRIDEMENT FOOT;  Surgeon: [name], DPM;  Location: [facility];  Service: Orthopedics/Podiatry;  Laterality: Left;   KNEE ARTHROSCOPY Left    LOWER EXTREMITY ANGIOGRAPHY Left 08/22/2022   Procedure: Lower Extremity Angiography;  Surgeon: Jama Cordella MATSU, MD;  Location: [facility];  Service: Cardiovascular;  Laterality: Left;   LOWER EXTREMITY ANGIOGRAPHY Left 05/11/2023   Procedure: Lower Extremity Angiography;  Surgeon: Marea Selinda RAMAN, MD;  Location: [facility];  Service: Cardiovascular;  Laterality: Left;   LOWER EXTREMITY ANGIOGRAPHY Right 09/11/2023   Procedure: Lower Extremity Angiography;  Surgeon: Jama Cordella MATSU, MD;  Location: [facility];  Service: Cardiovascular;  Laterality: Right;   LOWER EXTREMITY INTERVENTION Right 09/11/2023   Procedure: LOWER EXTREMITY INTERVENTION;  Surgeon: Jama Cordella MATSU, MD;  Location: [facility];  Service: Cardiovascular;  Laterality: Right;   TEE WITHOUT CARDIOVERSION N/A 03/01/2023   Procedure: TRANSESOPHAGEAL ECHOCARDIOGRAM;  Surgeon: Alluri, Keller BROCKS, MD;  Location: [facility];  Service: Cardiovascular;  Laterality: N/A;   TRANSMETATARSAL AMPUTATION Right 11/02/2023   Procedure: AMPUTATION, FOOT, TRANSMETATARSAL;  Surgeon: [name], DPM;  Location: [facility];  Service: Orthopedics/Podiatry;  Laterality: Right;   TRANSMETATARSAL AMPUTATION Right 12/21/2023   Procedure: REVISION, AMPUTATION, FOOT, TRANSMETATARSAL;  Surgeon: [name], DPM;   Location: [facility];  Service: Orthopedics/Podiatry;  Laterality: Right;   WOUND DEBRIDEMENT Left 09/15/2022   Procedure: 11043 - DEBRIDE SKIN. MUSCLE FASCIA;  Surgeon: [name], DPM;  Location: [facility];  Service: Podiatry;  Laterality: Left;   Social History:  reports that he has quit smoking. His smoking use included cigars. He has never used smokeless tobacco. He reports that he does not drink alcohol and does not use drugs.  No Known Allergies  Family History  Problem Relation Age of Onset   Diabetes Mellitus II Mother    CAD Mother     Prior to Admission medications   Medication Sig Start Date End Date Taking? Authorizing Provider  amoxicillin -clavulanate (AUGMENTIN ) 875-125 MG tablet Take 1 tablet by mouth 2 (two) times daily. 12/21/23   [name], DPM  chlorhexidine  (HIBICLENS ) 4 % external liquid Apply 15 mLs (1 Application total) topically as directed for 30 doses. Use as directed daily for 5 days every other week for 6 weeks. 09/14/23   [name], DPM  chlorhexidine  (HIBICLENS ) 4 % external liquid Apply 15 mLs (1 Application total) topically as directed for 30 doses. Use as directed daily for 5 days every other week for 6 weeks. 12/21/23   [name], DPM  clopidogrel  (PLAVIX ) 75 MG tablet TAKE 1 TABLET BY MOUTH EVERY DAY WITH BREAKFAST 11/26/23   Brown, Fallon E, NP  Continuous Glucose Sensor (FREESTYLE LIBRE 2 SENSOR) MISC  09/05/22   [provider]  cyanocobalamin  (VITAMIN B12) 1000 MCG tablet Take 1 tablet (1,000 mcg total) by mouth daily. 11/08/23   Laurita Pillion, MD  docusate sodium  (COLACE) 100 MG capsule Take 1 capsule (100 mg total) by mouth 2 (two) times daily. 09/05/22   Love, Sharlet RAMAN, PA-C  Dulaglutide  (TRULICITY ) 0.75 MG/0.5ML SOAJ Inject 0.75 mg into the skin once a week. Every Monday    [provider]  ELIQUIS  5 MG TABS tablet TAKE 1 TABLET(5 MG) BY MOUTH TWICE DAILY 08/27/23   Brown, Fallon E, NP  gabapentin  (NEURONTIN ) 300 MG capsule  Take 1 capsule (300 mg total) by mouth 3 (three) times daily. 09/05/22   Love, Sharlet RAMAN, PA-C  glucose blood (ONETOUCH ULTRA) test strip Use 3 (three) times daily 09/09/18   [provider]  insulin  glargine (LANTUS ) 100 UNIT/ML Solostar Pen Inject 28 Units into the skin at bedtime. Patient taking differently: Inject 28 Units into the skin daily. 11/08/23   Laurita Pillion, MD  Insulin  Pen Needle (FIFTY50 PEN NEEDLES) 32G X 4 MM MISC USE 4 TIMES DAILY 07/29/18   [provider]  iron  polysaccharides (NIFEREX) 150 MG capsule Take 1 capsule (150 mg total) by mouth daily. 11/09/23   Laurita Pillion, MD  metFORMIN  (GLUCOPHAGE -XR) 500 MG 24 hr tablet Take 1,000 mg by mouth 2 (two) times daily with a meal.    [provider]  metoprolol  tartrate (LOPRESSOR ) 25 MG tablet Take 1 tablet (25 mg total) by mouth 2 (two) times daily. Patient taking differently: Take 12.5  mg by mouth 2 (two) times daily. 09/05/22   Love, Sharlet RAMAN, PA-C  mupirocin  ointment (BACTROBAN ) 2 % Place 1 Application into the nose 2 (two) times daily for 60 doses. Use as directed 2 times daily for 5 days every other week for 6 weeks. 12/21/23 01/20/24  [name], DPM  oxyCODONE  (OXY IR/ROXICODONE ) 5 MG immediate release tablet Take 1 tablet (5 mg total) by mouth every 6 (six) hours as needed for moderate pain (pain score 4-6). SNF use only.  Refills per SNF providers 11/08/23   Laurita Pillion, MD  OZEMPIC, 0.25 OR 0.5 MG/DOSE, 2 MG/3ML SOPN Inject 0.75 mLs into the skin once a week.    [provider]  pravastatin  (PRAVACHOL ) 80 MG tablet Take 80 mg by mouth daily. 05/30/23   [provider]  Semaglutide, 1 MG/DOSE, (OZEMPIC, 1 MG/DOSE,) 4 MG/3ML SOPN Inject 1 mg into the skin every 7 (seven) days.    [provider]  traZODone  (DESYREL ) 50 MG tablet Take 50 mg by mouth at bedtime. 10/21/18   [provider]    Physical Exam: Vitals:   01/10/24 1109 01/10/24 1112  BP: 119/76   Pulse: 77    Resp: 16   Temp: (!) 97.5 F (36.4 C)   TempSrc: Oral   SpO2: 100%   Weight:  82.6 kg  Height:  6' 3 (1.905 m)   Exam:  Constitutional:  The patient is awake, alert, and oriented x 3. No acute distress. Eyes:  pupils and irises appear normal Normal lids and conjunctivae ENMT:  grossly normal hearing  Lips appear normal external ears, nose appear normal Oropharynx: mucosa, tongue,posterior pharynx appear normal Neck:  neck appears normal, no masses, normal ROM, supple no thyromegaly Respiratory:  No increased work of breathing. No wheezes, rales, or rhonchi No tactile fremitus Cardiovascular:  Regular rate and rhythm No murmurs, ectopy, or gallups. No lateral PMI. No thrills. Abdomen:  Abdomen is soft, non-tender, non-distended No hernias, masses, or organomegaly Normoactive bowel sounds.  Musculoskeletal:  No cyanosis, clubbing, or edema Right extremity demonstrates TMA with staples in place. There is foul smelling purulent drainage from the lateral aspect of the wound. There is erythema of the foot and their is an ulceration to the lateral malleolus of the right lower extremity.  S/P left BKA. Skin:  There is erythema and ulceration of the right lower extremity with purulent drainage from the wound from TMA.  palpation of skin: no induration or nodules Neurologic:  CN 2-12 intact Sensation all 4 extremities intact Psychiatric:  Mental status Mood, affect appropriate Orientation to person, place, time  judgment and insight appear intact  Data Reviewed:  BMP Lactic acid CBC with diff  Assessment and Plan: Osteomyelitis and worsening diabetic wound of the right foot/s/p TMA Ulceration of lateral malleolus of the right lower extremity History of BKA Left Lower extremity Will place the patient on IV cefepime  and Vancomycin . Consult ID. PICC has already been placed. Plan is for I & D with podiatry on 01/11/2024. MRSA positive wound culture from 11/02/2023.  Infectious disease consulted. Blood cultures x 2 pending.  DM II HBA1c is 5.7 on 11/01/2023. Will continue patient on home regimen of Lantus  28 units daily, metformin , and dulaglutide . Semaglutide and ozempic are held.  Will follow glucoses with FSBS and SSI.   Atrial fibrillation on Eliquis  Given plans for surgery in the morning, the eliquis  has been held. The patient's rate is well controlled on metoprolol .   CAD, s/p CABG Continue  pravachol , metoprolol , and plavix . Will not start Plavix  until 01/12/2024 following surgery on 01/11/2024.  History of Lt BKA Prosthesis for this limb.    Advance Care Planning:  Full Code  Consults:  Podiatry Infectious disease  Family Communication: Family at bedside.  Severity of Illness: The appropriate patient status for this patient is INPATIENT. Inpatient status is judged to be reasonable and necessary in order to provide the required intensity of service to ensure the patient's safety. The patient's presenting symptoms, physical exam findings, and initial radiographic and laboratory data in the context of their chronic comorbidities is felt to place them at high risk for further clinical deterioration. Furthermore, it is not anticipated that the patient will be medically stable for discharge from the hospital within 2 midnights of admission.   * I certify that at the point of admission it is my clinical judgment that the patient will require inpatient hospital care spanning beyond 2 midnights from the point of admission due to high intensity of service, high risk for further deterioration and high frequency of surveillance required.*  Author: Jasma Seevers, DO 01/10/2024 12:42 PM  For on call review [email or web address].

## 2024-01-10 NOTE — Consult Note (Signed)
 NAME: Andrew Harding  DOB: April 26, 1951  MRN: [id number]  Date/Time: 01/10/2024 4:13 PM  REQUESTING PROVIDER: Dr. Soledad Subjective:  REASON FOR CONSULT: Right foot infection ? KEETON KASSEBAUM is a 73 y.o. male with a history of diabetes mellitus, peripheral neuropathy, PAD, left BKA, CAD status post CABG, hypertension, A-fib on Eliquis , right TMA, infection at the surgical site, is admitted for surgical debridement tomorrow Patient initially in month of May underwent excision of the first metatarsal of the right foot along with excision of the sesamoid bone.  Culture then was MRSA and he was placed on Bactrim .  At that time he also underwent angio and had stent placement to the right anterior tibial artery, percutaneous transluminal angioplasty of the right mid popliteal artery and the right tibioperoneal trunk.  But the wound progressed and he underwent TMA on 11/02/2023.  Pathology showed acute osteo but margin was clear.  Culture was MRSA and Enterococcus faecalis.  Patient received 6 weeks of IV Daptomycin  and completed it on 12/14/2023.  He was at the skilled nursing facility.  The wound healing initially was complicated by superficial necrosis and eschar and it was debrided on 12/21/2023.  The cultures were sent from that debridement and it had Enterobacter, Pseudescheralis and anaerobes.  Patient was placed on Levaquin and p.o. Augmentin . I saw the patient in my clinic on Tuesday and there was an area at the surgical site that was oozing brownish fluid.  The plan was to place a PICC line and restart antibiotics Today he saw Dr. Ashley in in his office and Dr. Ashley noted purulent discharge.  The patient had to get a a PICC line this morning at 10 AM but he was admitted to the hospital.  He is to undergo debridement tomorrow He has got no fever or chills Recent culture which was taken on 01/03/2024 in the podiatrist office had Enterobacter cloacae which is resistant to pretty much all the antibiotics except IV,  aztreonam, aminoglycosides and meropenem  Vitals  01/10/24 11:09  BP 119/76  Temp 97.5 F (36.4 C) !  Pulse Rate 77  Resp 16  SpO2 100 %     Latest Reference Range & Units 01/10/24 12:18  WBC 4.0 - 10.5 K/uL 6.4  Hemoglobin 13.0 - 17.0 g/dL 88.3 (L)  HCT 60.9 - 47.9 % 36.5 (L)  Platelets 150 - 400 K/uL 193  Creatinine 0.61 - 1.24 mg/dL 8.64 (H)  Blood culture has been sent. He has been started on IV vancomycin  and cefepime . I am asked to see the patient for the same.    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) 09/03/2023   Atrial fibrillation with RVR (HCC) 08/19/2022   Bladder neck obstruction    Cellulitis 08/17/2022   Chronic kidney disease    Coronary artery disease    a.) s/p 4v CABG in 2014   Diabetes mellitus without complication (HCC)    Diabetic neuropathy (HCC)    Diabetic peripheral neuropathy (HCC)    Diabetic ulcer of toe of left foot associated with diabetes mellitus of other type, limited to breakdown of skin (HCC) 12/21/2017   Diverticulosis    Gout    Gram-negative bacteremia 05/11/2023   Heart murmur    History of osteomyelitis 05/31/2015   Hypercholesteremia    Hyperlipidemia    Hypertension  Hypotension due to hypovolemia 08/17/2022   Infection of left foot 08/19/2022   MSSA bacteremia 02/28/2023   Open wound of left foot with complication 05/10/2023   Osteomyelitis (HCC) 05/31/2015   Peripheral neuropathy    Postural dizziness with presyncope 02/26/2023   S/P BKA (below knee amputation) unilateral, 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   S/P transmetatarsal amputation of foot, left (HCC) 08/29/2022   Sepsis (HCC) 08/17/2022   Sepsis (HCC) 05/10/2023   Status post amputation of toe of right foot (HCC) 11/01/2015   Tubular adenoma    Vitamin D   deficiency     Past Surgical History:  Procedure Laterality Date   ABDOMINAL AORTOGRAM W/LOWER EXTREMITY N/A 11/05/2023   Procedure: ABDOMINAL AORTOGRAM W/LOWER EXTREMITY;  Surgeon: Marea Selinda RAMAN, MD;  Location: [facility];  Service: Cardiovascular;  Laterality: N/A;   ACHILLES TENDON SURGERY Right 09/14/2023   Procedure: TENOTOMY, ACHILLES;  Surgeon: [name], DPM;  Location: [facility];  Service: Orthopedics/Podiatry;  Laterality: Right;   AMPUTATION Left 08/19/2022   Procedure: TRANSMETATARSAL AMPUTATION LEFT FOOT WITH IRRIGATION AND DEBRIDEMENT;  Surgeon: Lennie Barter, DPM;  Location: [facility];  Service: Podiatry;  Laterality: Left;   AMPUTATION Left 05/16/2023   Procedure: AMPUTATION BELOW KNEE;  Surgeon: Marea Selinda RAMAN, MD;  Location: [facility];  Service: General;  Laterality: Left;   AMPUTATION Right 09/14/2023   Procedure: AMPUTATION, FOOT, RAY;  Surgeon: [name], DPM;  Location: [facility];  Service: Orthopedics/Podiatry;  Laterality: Right;   AMPUTATION TOE Right 06/01/2015   Procedure: AMPUTATION TOE;  Surgeon: Donnice Cory, DPM;  Location: [facility];  Service: Podiatry;  Laterality: Right;   AMPUTATION TOE Left 08/11/2022   Procedure: AMPUTATION TOE 2, 3, 4;  Surgeon: [name], DPM;  Location: [facility];  Service: Podiatry;  Laterality: Left;   CATARACT EXTRACTION, BILATERAL     CIRCUMCISION N/A 06/12/2022   Procedure: CIRCUMCISION ADULT;  Surgeon: Penne Knee, MD;  Location: [facility];  Service: Urology;  Laterality: N/A;   COLONOSCOPY WITH PROPOFOL  N/A 02/14/2016   Procedure: COLONOSCOPY WITH PROPOFOL ;  Surgeon: Gladis RAYMOND Mariner, MD;  Location: [facility] ENDOS[facility];  Service: Endoscopy;  Laterality: N/A;   COLONOSCOPY WITH PROPOFOL  N/A 01/07/2019   Procedure: COLONOSCOPY WITH PROPOFOL ;  Surgeon: Mariner Gladis RAYMOND, MD;  Location: [facility] ENDOSCOPY;  Service: Endoscopy;  Laterality: N/A;   CORONARY ARTERY BYPASS GRAFT N/A 08/2012   EXCISION PARTIAL PHALANX Right  06/01/2015   Procedure: EXCISION PARTIAL PHALANX /  BONE;  Surgeon: Donnice Cory, DPM;  Location: [facility];  Service: Podiatry;  Laterality: Right;   FLEXIBLE SIGMOIDOSCOPY N/A 05/29/2016   Procedure: FLEXIBLE SIGMOIDOSCOPY;  Surgeon: Gladis RAYMOND Mariner, MD;  Location: [facility] ENDOSCOPY;  Service: Endoscopy;  Laterality: N/A;   INCISION AND DRAINAGE Left 08/23/2022   Procedure: INCISION AND DRAINAGE;  Surgeon: [name], DPM;  Location: [facility];  Service: Podiatry;  Laterality: Left;   INCISION AND DRAINAGE OF WOUND Left 09/15/2022   Procedure: 11044 - DEBRIDE BONE and EXCISION IF 1ST METATARSAL BONE WITH  DELAY PRIMARY CLOSURE;  Surgeon: [name], DPM;  Location: [facility];  Service: Podiatry;  Laterality: Left;   IRRIGATION AND DEBRIDEMENT FOOT Left 02/28/2023   Procedure: IRRIGATION AND DEBRIDEMENT FOOT;  Surgeon: [name], DPM;  Location: [facility];  Service: Orthopedics/Podiatry;  Laterality: Left;   KNEE ARTHROSCOPY Left    LOWER EXTREMITY ANGIOGRAPHY Left 08/22/2022   Procedure: Lower Extremity Angiography;  Surgeon: Jama Cordella MATSU, MD;  Location: [facility]  INVASIVE CV LAB;  Service: Cardiovascular;  Laterality: Left;   LOWER EXTREMITY ANGIOGRAPHY Left 05/11/2023   Procedure: Lower Extremity Angiography;  Surgeon: Marea Selinda RAMAN, MD;  Location: [facility];  Service: Cardiovascular;  Laterality: Left;   LOWER EXTREMITY ANGIOGRAPHY Right 09/11/2023   Procedure: Lower Extremity Angiography;  Surgeon: Jama Cordella MATSU, MD;  Location: [facility];  Service: Cardiovascular;  Laterality: Right;   LOWER EXTREMITY INTERVENTION Right 09/11/2023   Procedure: LOWER EXTREMITY INTERVENTION;  Surgeon: Jama Cordella MATSU, MD;  Location: [facility];  Service: Cardiovascular;  Laterality: Right;   TEE WITHOUT CARDIOVERSION N/A 03/01/2023   Procedure: TRANSESOPHAGEAL ECHOCARDIOGRAM;  Surgeon: Alluri, Keller BROCKS, MD;  Location: [facility];  Service: Cardiovascular;   Laterality: N/A;   TRANSMETATARSAL AMPUTATION Right 11/02/2023   Procedure: AMPUTATION, FOOT, TRANSMETATARSAL;  Surgeon: [name], DPM;  Location: [facility];  Service: Orthopedics/Podiatry;  Laterality: Right;   TRANSMETATARSAL AMPUTATION Right 12/21/2023   Procedure: REVISION, AMPUTATION, FOOT, TRANSMETATARSAL;  Surgeon: [name], DPM;  Location: [facility];  Service: Orthopedics/Podiatry;  Laterality: Right;   WOUND DEBRIDEMENT Left 09/15/2022   Procedure: 11043 - DEBRIDE SKIN. MUSCLE FASCIA;  Surgeon: [name], DPM;  Location: [facility];  Service: Podiatry;  Laterality: Left;    Social History   Socioeconomic History   Marital status: Divorced    Spouse name: Ashley,Angela C   Number of children: Not on file   Years of education: Not on file   Highest education level: Not on file  Occupational History   Not on file  Tobacco Use   Smoking status: Former    Types: Cigars   Smokeless tobacco: Never  Vaping Use   Vaping status: Never Used  Substance and Sexual Activity   Alcohol use: No   Drug use: No   Sexual activity: Never  Other Topics Concern   Not on file  Social History Narrative   Patient currently resides at    [facility]   [address]  [place], [place]  [phone]      Patient is legally separated and lives at home by himself. His ex -wife is his HCPOA. Neighbors and friends are his support system. He used to work for [employer]    Social Drivers of Corporate investment banker Strain: Low Risk  (12/13/2022)   Received [facility]   Overall Financial Resource Strain (CARDIA)    Difficulty of Paying Living Expenses: Not hard at all  Food Insecurity: No Food Insecurity (11/01/2023)   Hunger Vital Sign    Worried About Running Out of Food in the Last Year: Never true    Ran Out of Food in the Last Year: Never true  Transportation Needs: No Transportation Needs (11/01/2023)   PRAPARE - Therapist, art (Medical): No    Lack of Transportation (Non-Medical): No  Physical Activity: Not on file  Stress: Not on file  Social Connections: Moderately Isolated (11/01/2023)   Social Connection and Isolation Panel    Frequency of Communication with Friends and Family: More than three times a week    Frequency of Social Gatherings with Friends and Family: Twice a week    Attends Religious Services: More than 4 times per year    Active Member of [employer] or Organizations: No    Attends Banker Meetings: Never    Marital Status: Divorced  Catering manager Violence: Not At Risk (11/01/2023)  Humiliation, Afraid, Rape, and Kick questionnaire    Fear of Current or Ex-Partner: No    Emotionally Abused: No    Physically Abused: No    Sexually Abused: No    Family History  Problem Relation Age of Onset   Diabetes Mellitus II Mother    CAD Mother    No Known Allergies I? Current Facility-Administered Medications  Medication Dose Route Frequency Provider Last Rate Last Admin   acetaminophen  (TYLENOL ) tablet 650 mg  650 mg Oral Q6H PRN Swayze, Ava, DO       Or   acetaminophen  (TYLENOL ) suppository 650 mg  650 mg Rectal Q6H PRN Swayze, Ava, DO       bisacodyl  (DULCOLAX) suppository 10 mg  10 mg Rectal Daily PRN Swayze, Ava, DO       ceFEPIme  (MAXIPIME ) 2 g in sodium chloride  0.9 % 100 mL IVPB  2 g Intravenous Q12H Swayze, Ava, DO 200 mL/hr at 01/10/24 1521 2 g at 01/10/24 1521   insulin  aspart (novoLOG ) injection 0-9 Units  0-9 Units Subcutaneous TID WC Swayze, Ava, DO       leptospermum manuka honey (MEDIHONEY) paste 1 Application  1 Application Topical Daily Swayze, Ava, DO       ondansetron  (ZOFRAN ) tablet 4 mg  4 mg Oral Q6H PRN Swayze, Ava, DO       Or   ondansetron  (ZOFRAN ) injection 4 mg  4 mg Intravenous Q6H PRN Swayze, Ava, DO       polyethylene glycol (MIRALAX  / GLYCOLAX ) packet 17 g  17 g Oral Daily PRN Swayze, Ava, DO       [START ON 01/11/2024] vancomycin   (VANCOREADY) IVPB 750 mg/150 mL  750 mg Intravenous Q12H Elesa Perkins, RPH       Current Outpatient Medications  Medication Sig Dispense Refill   clopidogrel  (PLAVIX ) 75 MG tablet TAKE 1 TABLET BY MOUTH EVERY DAY WITH BREAKFAST 30 tablet 5   cyanocobalamin  (VITAMIN B12) 1000 MCG tablet Take 1 tablet (1,000 mcg total) by mouth daily.     Dulaglutide  (TRULICITY ) 0.75 MG/0.5ML SOAJ Inject 0.75 mg into the skin once a week. Every Monday     ELIQUIS  5 MG TABS tablet TAKE 1 TABLET(5 MG) BY MOUTH TWICE DAILY 60 tablet 2   gabapentin  (NEURONTIN ) 300 MG capsule Take 1 capsule (300 mg total) by mouth 3 (three) times daily. 90 capsule 0   insulin  glargine (LANTUS ) 100 UNIT/ML Solostar Pen Inject 28 Units into the skin at bedtime. (Patient taking differently: Inject 28 Units into the skin daily.)     metoprolol  tartrate (LOPRESSOR ) 25 MG tablet Take 1 tablet (25 mg total) by mouth 2 (two) times daily. (Patient taking differently: Take 12.5 mg by mouth 2 (two) times daily.) 60 tablet 0   mupirocin  ointment (BACTROBAN ) 2 % Place 1 Application into the nose 2 (two) times daily for 60 doses. Use as directed 2 times daily for 5 days every other week for 6 weeks. 60 g 0   pravastatin  (PRAVACHOL ) 80 MG tablet Take 80 mg by mouth daily.     traZODone  (DESYREL ) 50 MG tablet Take 50 mg by mouth at bedtime.     amoxicillin -clavulanate (AUGMENTIN ) 875-125 MG tablet Take 1 tablet by mouth 2 (two) times daily. (Patient not taking: Reported on 01/10/2024) 20 tablet 0   chlorhexidine  (HIBICLENS ) 4 % external liquid Apply 15 mLs (1 Application total) topically as directed for 30 doses. Use as directed daily for 5 days every other week for 6  weeks. (Patient not taking: Reported on 01/10/2024) 946 mL 1   chlorhexidine  (HIBICLENS ) 4 % external liquid Apply 15 mLs (1 Application total) topically as directed for 30 doses. Use as directed daily for 5 days every other week for 6 weeks. (Patient not taking: Reported on 01/10/2024) 946  mL 1   Continuous Glucose Sensor (FREESTYLE LIBRE 2 SENSOR) MISC      docusate sodium  (COLACE) 100 MG capsule Take 1 capsule (100 mg total) by mouth 2 (two) times daily. (Patient not taking: Reported on 01/10/2024) 100 capsule 0   glucose blood (ONETOUCH ULTRA) test strip Use 3 (three) times daily     Insulin  Pen Needle (FIFTY50 PEN NEEDLES) 32G X 4 MM MISC USE 4 TIMES DAILY     iron  polysaccharides (NIFEREX) 150 MG capsule Take 1 capsule (150 mg total) by mouth daily. (Patient not taking: Reported on 01/10/2024)     metFORMIN  (GLUCOPHAGE -XR) 500 MG 24 hr tablet Take 1,000 mg by mouth 2 (two) times daily with a meal. (Patient not taking: Reported on 01/10/2024)     oxyCODONE  (OXY IR/ROXICODONE ) 5 MG immediate release tablet Take 1 tablet (5 mg total) by mouth every 6 (six) hours as needed for moderate pain (pain score 4-6). SNF use only.  Refills per SNF providers (Patient not taking: Reported on 01/10/2024) 12 tablet 0   OZEMPIC, 0.25 OR 0.5 MG/DOSE, 2 MG/3ML SOPN Inject 0.75 mLs into the skin once a week. (Patient not taking: Reported on 01/10/2024)     Semaglutide, 1 MG/DOSE, (OZEMPIC, 1 MG/DOSE,) 4 MG/3ML SOPN Inject 1 mg into the skin every 7 (seven) days. (Patient not taking: Reported on 01/10/2024)       Abtx:  Anti-infectives (From admission, onward)    Start     Dose/Rate Route Frequency Ordered Stop   01/11/24 0100  vancomycin  (VANCOREADY) IVPB 750 mg/150 mL        750 mg 150 mL/hr over 60 Minutes Intravenous Every 12 hours 01/10/24 1340     01/10/24 1330  ceFEPIme  (MAXIPIME ) 2 g in sodium chloride  0.9 % 100 mL IVPB        2 g 200 mL/hr over 30 Minutes Intravenous Every 12 hours 01/10/24 1324     01/10/24 1230  piperacillin -tazobactam (ZOSYN ) IVPB 3.375 g        3.375 g 100 mL/hr over 30 Minutes Intravenous  Once 01/10/24 1218 01/10/24 1355   01/10/24 1230  vancomycin  (VANCOREADY) IVPB 2000 mg/400 mL        2,000 mg 200 mL/hr over 120 Minutes Intravenous  Once 01/10/24 1218 01/10/24  1512       REVIEW OF SYSTEMS:  Const: negative fever, negative chills, negative weight loss Eyes: negative diplopia or visual changes, negative eye pain ENT: negative coryza, negative sore throat Resp: negative cough, hemoptysis, dyspnea Cards: negative for chest pain, palpitations, lower extremity edema GU: negative for frequency, dysuria and hematuria GI: Negative for abdominal pain, diarrhea, bleeding, constipation Skin: negative for rash and pruritus Heme: negative for easy bruising and gum/nose bleeding MS: negative for myalgias, arthralgias, back pain and muscle weakness Neurolo:negative for headaches, dizziness, vertigo, memory problems  Psych:  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  Objective:  VITALS:  BP 119/76 (BP Location: Left Arm)   Pulse 77   Temp 97.9 F (36.6 C) (Oral)   Resp 16   Ht 6' 3 (1.905 m)   Wt 82.6 kg  SpO2 100%   BMI 22.75 kg/m   PHYSICAL EXAM:  General: Alert, cooperative, no distress, appears stated age.  Head: Normocephalic, without obvious abnormality, atraumatic. Eyes: Conjunctivae clear, anicteric sclerae. Pupils are equal ENT Nares normal. No drainage or sinus tenderness. Lips, mucosa, and tongue normal. No Thrush Neck: Supple, symmetrical, no adenopathy, thyroid: non tender no carotid bruit and no JVD. Back: No CVA tenderness. Lungs: Clear to auscultation bilaterally. No Wheezing or Rhonchi. No rales. Heart: Irregular.  Rate well-controlled.   Abdomen: Soft, non-tender,not distended. Bowel sounds normal. No masses Extremities: Right foot At the lateral margin there is soggy Purulence   Left BKA  Skin: No rashes or lesions. Or bruising Lymph: Cervical, supraclavicular normal. Neurologic: Grossly non-focal Pertinent Labs Lab Results CBC    Component Value Date/Time   WBC 6.4 01/10/2024 1218   RBC 4.07 (L) 01/10/2024 1218   HGB  11.6 (L) 01/10/2024 1218   HCT 36.5 (L) 01/10/2024 1218   PLT 193 01/10/2024 1218   MCV 89.7 01/10/2024 1218   MCH 28.5 01/10/2024 1218   MCHC 31.8 01/10/2024 1218   RDW 13.6 01/10/2024 1218   LYMPHSABS 1.2 01/10/2024 1218   MONOABS 0.4 01/10/2024 1218   EOSABS 0.3 01/10/2024 1218   BASOSABS 0.1 01/10/2024 1218       Latest Ref Rng & Units 01/10/2024   12:18 PM 11/07/2023    4:25 AM 11/06/2023    5:37 AM  CMP  Glucose 70 - 99 mg/dL 852  831  851   BUN 8 - 23 mg/dL 12  18  19    Creatinine 0.61 - 1.24 mg/dL 8.64  8.73  8.73   Sodium 135 - 145 mmol/L 140  137  137   Potassium 3.5 - 5.1 mmol/L 4.1  4.0  4.6   Chloride 98 - 111 mmol/L 107  106  107   CO2 22 - 32 mmol/L 23  24  25    Calcium 8.9 - 10.3 mg/dL 8.9  8.5  8.5       Microbiology: Blood culture pending   IMAGING RESULTS: None to reviewed this admission I have personally reviewed the films ? Impression/Recommendation ? Diabetic foot infection with peripheral artery disease involving the right foot at the site of the prior TMA Initially had TMA on 11/02/2023 and the culture was MRSA and Enterococcus faecalis had acute osteomyelitis and he was treated with 6 weeks of IV daptomycin  and completed around to 12/21/2023 Now he has some purulence at the surgical site at 1 margin Enterobacter cloacae which is multidrug-resistant is growing from the wound culture that was done in the podiatrist office on 01/03/2024 Patient is currently on cefepime  and started on vancomycin  Is going for debridement tomorrow.  Deep cultures will be sent.  PAD On 11/05/2023 underwent angio and had mechanical thrombectomy of the right anterior tibial artery, balloon angioplasty and stent placement.  Also underwent angioplasty of the right distal popliteal, tibioperoneal trunk and stent placement in the right tibioperoneal trunk.  History of left BKA  Diabetes mellitus  CAD status post CABG  A-fib This consult involved complex antimicrobial  management. ? ___I have personally spent  -60--minutes involved in face-to-face and non-face-to-face activities for this patient on the day of the visit. Professional time spent includes the following activities: Preparing to see the patient (review of tests), Obtaining and/or reviewing separately obtained history (admission/discharge record), Performing a medically appropriate examination and/or evaluation , Ordering medications/tests/procedures, referring and communicating with other health care professionals, Documenting clinical information  in the EMR, Independently interpreting results (not separately reported), Communicating results to the patient/family/caregiver, Counseling and educating the patient/family/caregiver and Care coordination (not separately reported).    ________________________________________________  Note:  This document was prepared using Conservation officer, historic buildings and may include unintentional dictation errors.

## 2024-01-10 NOTE — Consult Note (Signed)
 ORTHOPAEDIC CONSULTATION  REQUESTING PHYSICIAN: Claudene Rover, MD  Chief Complaint: Diabetic foot infection right foot  HPI: Andrew Harding is a 73 y.o. male who complains of worsening infection to his right foot.  Longstanding well-known patient to my office.  He is undergoing multiple surgical debridements including partial ray amputation right foot and debridement of transmetatarsal amputation x 2.  Has been seen in outpatient clinic over the last several weeks.  Noted drainage to the distal lateral aspect of the right foot from the most recent transmetatarsal amputation.  Culture is growing a multidrug-resistant Enterobacter.  Upon evaluation today frank purulence was noted from the wound.  Also there was an area of concern on x-ray for possible acute osteomyelitis.  At this time I have recommended admission to the hospital for IV antibiotics.  He had a PICC line placed today planning for an outpatient IV antibiotics but given this recent worsening infection and concern for osteomyelitis will admit to the hospital.  Patient has undergone previous left-sided BKA.  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) 09/03/2023   Atrial fibrillation with RVR (HCC) 08/19/2022   Bladder neck obstruction    Cellulitis 08/17/2022   Chronic kidney disease    Coronary artery disease    a.) s/p 4v CABG in 2014   Diabetes mellitus without complication (HCC)    Diabetic neuropathy (HCC)    Diabetic peripheral neuropathy (HCC)    Diabetic ulcer of toe of left foot associated with diabetes mellitus of other type, limited to breakdown of skin (HCC) 12/21/2017   Diverticulosis    Gout    Gram-negative bacteremia 05/11/2023   Heart murmur    History of osteomyelitis 05/31/2015   Hypercholesteremia    Hyperlipidemia    Hypertension     Hypotension due to hypovolemia 08/17/2022   Infection of left foot 08/19/2022   MSSA bacteremia 02/28/2023   Open wound of left foot with complication 05/10/2023   Osteomyelitis (HCC) 05/31/2015   Peripheral neuropathy    Postural dizziness with presyncope 02/26/2023   S/P BKA (below knee amputation) unilateral, 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   S/P transmetatarsal amputation of foot, left (HCC) 08/29/2022   Sepsis (HCC) 08/17/2022   Sepsis (HCC) 05/10/2023   Status post amputation of toe of right foot (HCC) 11/01/2015   Tubular adenoma    Vitamin D  deficiency    Past Surgical History:  Procedure Laterality Date   ABDOMINAL AORTOGRAM W/LOWER EXTREMITY N/A 11/05/2023   Procedure: ABDOMINAL AORTOGRAM W/LOWER EXTREMITY;  Surgeon: Marea Selinda RAMAN, MD;  Location: [facility];  Service: Cardiovascular;  Laterality: N/A;   ACHILLES TENDON SURGERY Right 09/14/2023   Procedure: TENOTOMY, ACHILLES;  Surgeon: [name], DPM;  Location: [facility];  Service: Orthopedics/Podiatry;  Laterality: Right;   AMPUTATION Left 08/19/2022   Procedure: TRANSMETATARSAL AMPUTATION LEFT FOOT WITH IRRIGATION AND DEBRIDEMENT;  Surgeon: Lennie Barter, DPM;  Location: [facility];  Service: Podiatry;  Laterality: Left;   AMPUTATION Left 05/16/2023   Procedure: AMPUTATION BELOW KNEE;  Surgeon: Marea Selinda RAMAN, MD;  Location: [facility];  Service: General;  Laterality: Left;   AMPUTATION Right 09/14/2023   Procedure: AMPUTATION, FOOT, RAY;  Surgeon: [name], DPM;  Location: [facility];  Service: Orthopedics/Podiatry;  Laterality: Right;   AMPUTATION TOE Right 06/01/2015  Procedure: AMPUTATION TOE;  Surgeon: Donnice Cory, DPM;  Location: [facility];  Service: Podiatry;  Laterality: Right;   AMPUTATION TOE Left 08/11/2022   Procedure: AMPUTATION TOE 2, 3, 4;  Surgeon: [name], DPM;  Location: [facility];  Service: Podiatry;  Laterality: Left;   CATARACT EXTRACTION, BILATERAL     CIRCUMCISION N/A  06/12/2022   Procedure: CIRCUMCISION ADULT;  Surgeon: Penne Knee, MD;  Location: [facility];  Service: Urology;  Laterality: N/A;   COLONOSCOPY WITH PROPOFOL  N/A 02/14/2016   Procedure: COLONOSCOPY WITH PROPOFOL ;  Surgeon: Gladis RAYMOND Mariner, MD;  Location: [facility];  Service: Endoscopy;  Laterality: N/A;   COLONOSCOPY WITH PROPOFOL  N/A 01/07/2019   Procedure: COLONOSCOPY WITH PROPOFOL ;  Surgeon: Mariner Gladis RAYMOND, MD;  Location: [facility];  Service: Endoscopy;  Laterality: N/A;   CORONARY ARTERY BYPASS GRAFT N/A 08/2012   EXCISION PARTIAL PHALANX Right 06/01/2015   Procedure: EXCISION PARTIAL PHALANX /  BONE;  Surgeon: Donnice Cory, DPM;  Location: [facility];  Service: Podiatry;  Laterality: Right;   FLEXIBLE SIGMOIDOSCOPY N/A 05/29/2016   Procedure: FLEXIBLE SIGMOIDOSCOPY;  Surgeon: Gladis RAYMOND Mariner, MD;  Location: [facility] ENDOSCOPY;  Service: Endoscopy;  Laterality: N/A;   INCISION AND DRAINAGE Left 08/23/2022   Procedure: INCISION AND DRAINAGE;  Surgeon: [name], DPM;  Location: [facility];  Service: Podiatry;  Laterality: Left;   INCISION AND DRAINAGE OF WOUND Left 09/15/2022   Procedure: 11044 - DEBRIDE BONE and EXCISION IF 1ST METATARSAL BONE WITH  DELAY PRIMARY CLOSURE;  Surgeon: [name], DPM;  Location: [facility];  Service: Podiatry;  Laterality: Left;   IRRIGATION AND DEBRIDEMENT FOOT Left 02/28/2023   Procedure: IRRIGATION AND DEBRIDEMENT FOOT;  Surgeon: [name], DPM;  Location: [facility];  Service: Orthopedics/Podiatry;  Laterality: Left;   KNEE ARTHROSCOPY Left    LOWER EXTREMITY ANGIOGRAPHY Left 08/22/2022   Procedure: Lower Extremity Angiography;  Surgeon: Jama Cordella MATSU, MD;  Location: [facility];  Service: Cardiovascular;  Laterality: Left;   LOWER EXTREMITY ANGIOGRAPHY Left 05/11/2023   Procedure: Lower Extremity Angiography;  Surgeon: Marea Selinda RAMAN, MD;  Location: [facility];  Service: Cardiovascular;  Laterality: Left;   LOWER  EXTREMITY ANGIOGRAPHY Right 09/11/2023   Procedure: Lower Extremity Angiography;  Surgeon: Jama Cordella MATSU, MD;  Location: [facility];  Service: Cardiovascular;  Laterality: Right;   LOWER EXTREMITY INTERVENTION Right 09/11/2023   Procedure: LOWER EXTREMITY INTERVENTION;  Surgeon: Jama Cordella MATSU, MD;  Location: [facility];  Service: Cardiovascular;  Laterality: Right;   TEE WITHOUT CARDIOVERSION N/A 03/01/2023   Procedure: TRANSESOPHAGEAL ECHOCARDIOGRAM;  Surgeon: Alluri, Keller BROCKS, MD;  Location: [facility];  Service: Cardiovascular;  Laterality: N/A;   TRANSMETATARSAL AMPUTATION Right 11/02/2023   Procedure: AMPUTATION, FOOT, TRANSMETATARSAL;  Surgeon: [name], DPM;  Location: [facility];  Service: Orthopedics/Podiatry;  Laterality: Right;   TRANSMETATARSAL AMPUTATION Right 12/21/2023   Procedure: REVISION, AMPUTATION, FOOT, TRANSMETATARSAL;  Surgeon: [name], DPM;  Location: [facility];  Service: Orthopedics/Podiatry;  Laterality: Right;   WOUND DEBRIDEMENT Left 09/15/2022   Procedure: 11043 - DEBRIDE SKIN. MUSCLE FASCIA;  Surgeon: [name], DPM;  Location: [facility];  Service: Podiatry;  Laterality: Left;   Social History   Socioeconomic History   Marital status: Divorced    Spouse name: Ashley,Angela C   Number of children: Not on file   Years of education: Not on file   Highest education level: Not on file  Occupational History   Not on file  Tobacco Use   Smoking status: Former    Types: Cigars   Smokeless tobacco: Never  Vaping Use   Vaping status: Never Used  Substance and Sexual Activity   Alcohol use: No   Drug use: No   Sexual activity: Never  Other Topics Concern   Not on file  Social History Narrative   Patient currently resides at    [employer]   [address]  [place], [place]  [phone]      Patient is legally separated and lives at home by himself. His ex -wife is his HCPOA. Neighbors and friends are his support  system. He used to work for [employer]    Social Drivers of Corporate investment banker Strain: Low Risk  (12/13/2022)   Received from [facility]   Overall Financial Resource Strain (CARDIA)    Difficulty of Paying Living Expenses: Not hard at all  Food Insecurity: No Food Insecurity (11/01/2023)   Hunger Vital Sign    Worried About Running Out of Food in the Last Year: Never true    Ran Out of Food in the Last Year: Never true  Transportation Needs: No Transportation Needs (11/01/2023)   PRAPARE - Administrator, Civil Service (Medical): No    Lack of Transportation (Non-Medical): No  Physical Activity: Not on file  Stress: Not on file  Social Connections: Moderately Isolated (11/01/2023)   Social Connection and Isolation Panel    Frequency of Communication with Friends and Family: More than three times a week    Frequency of Social Gatherings with Friends and Family: Twice a week    Attends Religious Services: More than 4 times per year    Active Member of [employer] or Organizations: No    Attends Banker Meetings: Never    Marital Status: Divorced   Family History  Problem Relation Age of Onset   Diabetes Mellitus II Mother    CAD Mother    No Known Allergies Prior to Admission medications   Medication Sig Start Date End Date Taking? Authorizing Provider  amoxicillin -clavulanate (AUGMENTIN ) 875-125 MG tablet Take 1 tablet by mouth 2 (two) times daily. 12/21/23   [name], DPM  chlorhexidine  (HIBICLENS ) 4 % external liquid Apply 15 mLs (1 Application total) topically as directed for 30 doses. Use as directed daily for 5 days every other week for 6 weeks. 09/14/23   [name], DPM  chlorhexidine  (HIBICLENS ) 4 % external liquid Apply 15 mLs (1 Application total) topically as directed for 30 doses. Use as directed daily for 5 days every other week for 6 weeks. 12/21/23   [name], DPM  clopidogrel  (PLAVIX ) 75 MG  tablet TAKE 1 TABLET BY MOUTH EVERY DAY WITH BREAKFAST 11/26/23   Brown, Fallon E, NP  Continuous Glucose Sensor (FREESTYLE LIBRE 2 SENSOR) MISC  09/05/22   [provider]  cyanocobalamin  (VITAMIN B12) 1000 MCG tablet Take 1 tablet (1,000 mcg total) by mouth daily. 11/08/23   Laurita Pillion, MD  docusate sodium  (COLACE) 100 MG capsule Take 1 capsule (100 mg total) by mouth 2 (two) times daily. 09/05/22   Love, Sharlet RAMAN, PA-C  Dulaglutide  (TRULICITY ) 0.75 MG/0.5ML SOAJ Inject 0.75 mg into the skin once a week. Every Monday    [provider]  ELIQUIS  5 MG TABS tablet TAKE 1 TABLET(5 MG) BY MOUTH TWICE DAILY 08/27/23   Brown, Fallon E, NP  gabapentin  (NEURONTIN ) 300 MG capsule Take 1 capsule (  300 mg total) by mouth 3 (three) times daily. 09/05/22   Love, Sharlet RAMAN, PA-C  glucose blood (ONETOUCH ULTRA) test strip Use 3 (three) times daily 09/09/18   [provider]  insulin  glargine (LANTUS ) 100 UNIT/ML Solostar Pen Inject 28 Units into the skin at bedtime. Patient taking differently: Inject 28 Units into the skin daily. 11/08/23   Laurita Pillion, MD  Insulin  Pen Needle (FIFTY50 PEN NEEDLES) 32G X 4 MM MISC USE 4 TIMES DAILY 07/29/18   [provider]  iron  polysaccharides (NIFEREX) 150 MG capsule Take 1 capsule (150 mg total) by mouth daily. 11/09/23   Laurita Pillion, MD  metFORMIN  (GLUCOPHAGE -XR) 500 MG 24 hr tablet Take 1,000 mg by mouth 2 (two) times daily with a meal.    [provider]  metoprolol  tartrate (LOPRESSOR ) 25 MG tablet Take 1 tablet (25 mg total) by mouth 2 (two) times daily. Patient taking differently: Take 12.5 mg by mouth 2 (two) times daily. 09/05/22   Love, Sharlet RAMAN, PA-C  mupirocin  ointment (BACTROBAN ) 2 % Place 1 Application into the nose 2 (two) times daily for 60 doses. Use as directed 2 times daily for 5 days every other week for 6 weeks. 12/21/23 01/20/24  [name], DPM  oxyCODONE  (OXY IR/ROXICODONE ) 5 MG immediate release tablet Take 1 tablet (5  mg total) by mouth every 6 (six) hours as needed for moderate pain (pain score 4-6). SNF use only.  Refills per SNF providers 11/08/23   Laurita Pillion, MD  OZEMPIC, 0.25 OR 0.5 MG/DOSE, 2 MG/3ML SOPN Inject 0.75 mLs into the skin once a week.    [provider]  pravastatin  (PRAVACHOL ) 80 MG tablet Take 80 mg by mouth daily. 05/30/23   [provider]  Semaglutide, 1 MG/DOSE, (OZEMPIC, 1 MG/DOSE,) 4 MG/3ML SOPN Inject 1 mg into the skin every 7 (seven) days.    [provider]  traZODone  (DESYREL ) 50 MG tablet Take 50 mg by mouth at bedtime. 10/21/18   [provider]   No results found.  Positive ROS: All other systems have been reviewed and were otherwise negative with the exception of those mentioned in the HPI and as above.  12 point ROS was performed.  Physical Exam: General: Alert and oriented.  No apparent distress.  Vascular:  Left foot:Patient status post BKA  Right foot: Dorsalis Pedis:  diminished Posterior Tibial:  diminished  Neuro:absent  Derm: He is undergoing recent transmetatarsal amputation to the right foot.  The medial two thirds of the incision are completely coapted.  The lateral one third of the incision is open and draining.  There was noted purulence from the wound today.  Ortho/MS: Status post right side transmetatarsal amputation.  Status post left-sided below the knee amputation   Outpatient culture shows Enterobacter cloacae multidrug-resistant.  Susceptible to amikacin, aztreonam, cefepime , imipenem, and meropenem .  Assessment: New diabetic foot infection with multidrug-resistant organism. Diabetes with neuropathy Peripheral vascular disease Acute osteomyelitis  Plan: At this point we will plan for surgical debridement irrigation and removal of bone from the area.  Patient will need antibiotics IV.  A PICC line has been placed already.  He will maintain nonweightbearing to the right foot postoperatively.  Orders have been  placed for surgical intervention tomorrow.    [name] LABOR, DPM Cell [phone]   01/10/2024 12:36 PM

## 2024-01-10 NOTE — ED Provider Notes (Signed)
 [facility] Provider Note    Event Date/Time   First MD Initiated Contact with Patient 01/10/24 1203     (approximate)   History   Wound Infection   HPI  Andrew Harding is a 73 y.o. male who presents to the ED for evaluation of Wound Infection   Patient presents from podiatry clinic for admission for planned I&D of his right foot.  Patient reports no symptoms.  Not currently on antibiotics.  History of multiple podiatric surgeries, all digits have been amputated on his right foot   Physical Exam   Triage Vital Signs: ED Triage Vitals  Encounter Vitals Group     BP 01/10/24 1109 119/76     Girls Systolic BP Percentile --      Girls Diastolic BP Percentile --      Boys Systolic BP Percentile --      Boys Diastolic BP Percentile --      Pulse Rate 01/10/24 1109 77     Resp 01/10/24 1109 16     Temp 01/10/24 1109 (!) 97.5 F (36.4 C)     Temp Source 01/10/24 1109 Oral     SpO2 01/10/24 1109 100 %     Weight 01/10/24 1112 182 lb (82.6 kg)     Height 01/10/24 1112 6' 3 (1.905 m)     Head Circumference --      Peak Flow --      Pain Score 01/10/24 1110 0     Pain Loc --      Pain Education --      Exclude from Growth Chart --     Most recent vital signs: Vitals:   01/10/24 1109  BP: 119/76  Pulse: 77  Resp: 16  Temp: (!) 97.5 F (36.4 C)  SpO2: 100%    General: Awake, no distress.  CV:  Good peripheral perfusion.  Resp:  Normal effort.  Abd:  No distention.  MSK:  No deformity noted.  Neuro:  No focal deficits appreciated. Other:  I take down a clean bandage over his right foot, reportedly applied at the podiatry clinic, lateral aspect of suture line, as pictured below, with surrounding erythema and purulence     ED Results / Procedures / Treatments   Labs (all labs ordered are listed, but only abnormal results are displayed) Labs Reviewed  CULTURE, BLOOD (SINGLE)  CBC WITH DIFFERENTIAL/PLATELET  BASIC METABOLIC PANEL WITH  GFR  LACTIC ACID, PLASMA  LACTIC ACID, PLASMA    EKG   RADIOLOGY   Official radiology report(s): No results found.  PROCEDURES and INTERVENTIONS:  Procedures  Medications  piperacillin -tazobactam (ZOSYN ) IVPB 3.375 g (has no administration in time range)  vancomycin  (VANCOREADY) IVPB 2000 mg/400 mL (has no administration in time range)     IMPRESSION / MDM / ASSESSMENT AND PLAN / ED COURSE  I reviewed the triage vital signs and the nursing notes.  Differential diagnosis includes, but is not limited to, osteomyelitis, abscess, cellulitis, wound dehiscence, sepsis or bacteremia  {Patient presents with symptoms of an acute illness or injury that is potentially life-threatening.  Patient presents to the ED asymptomatic from podiatry clinic for admission for I&D of diabetic foot infection.  Normal vital signs.  Exam with signs of infection to the lateral aspect of previous suture line of his right foot, as pictured above.  Will obtain basic labs, draw single culture, provide antibiotics and consult medicine for admission.  Clinical Course as of 01/10/24 1236  Thu  Jan 10, 2024  1234 I consult with medicine who agrees to admit [DS]    Clinical Course User Index [DS] Claudene Rover, MD     FINAL CLINICAL IMPRESSION(S) / ED DIAGNOSES   Final diagnoses:  Diabetic foot infection (HCC)     Rx / DC Orders   ED Discharge Orders     None        Note:  This document was prepared using Dragon voice recognition software and may include unintentional dictation errors.   Claudene Rover, MD 01/10/24 1236

## 2024-01-10 NOTE — ED Triage Notes (Signed)
 Pt states he has infection in his foot and is being admitted to the hospital for a procedure tomorrow. States Dr Eva Gay sent him to the ED for a PICC line.

## 2024-01-11 ENCOUNTER — Inpatient Hospital Stay

## 2024-01-11 ENCOUNTER — Encounter
Admission: EM | Disposition: A | Discharge status: Home Health | Payer: Self-pay | Source: Home / Self Care | Attending: Obstetrics and Gynecology

## 2024-01-11 ENCOUNTER — Ambulatory Visit: Admission: RE | Admit: 2024-01-11 | Source: Home / Self Care | Admitting: Podiatry

## 2024-01-11 ENCOUNTER — Encounter: Payer: Self-pay | Admitting: Internal Medicine

## 2024-01-11 ENCOUNTER — Other Ambulatory Visit: Payer: Self-pay

## 2024-01-11 DIAGNOSIS — Z9889 Other specified postprocedural states: Secondary | ICD-10-CM

## 2024-01-11 DIAGNOSIS — Z7902 Long term (current) use of antithrombotics/antiplatelets: Secondary | ICD-10-CM

## 2024-01-11 DIAGNOSIS — N1831 Chronic kidney disease, stage 3a: Secondary | ICD-10-CM

## 2024-01-11 DIAGNOSIS — I4891 Unspecified atrial fibrillation: Secondary | ICD-10-CM

## 2024-01-11 DIAGNOSIS — I739 Peripheral vascular disease, unspecified: Secondary | ICD-10-CM

## 2024-01-11 DIAGNOSIS — Z951 Presence of aortocoronary bypass graft: Secondary | ICD-10-CM

## 2024-01-11 DIAGNOSIS — Z87891 Personal history of nicotine dependence: Secondary | ICD-10-CM

## 2024-01-11 DIAGNOSIS — Z95828 Presence of other vascular implants and grafts: Secondary | ICD-10-CM

## 2024-01-11 DIAGNOSIS — T8789 Other complications of amputation stump: Secondary | ICD-10-CM

## 2024-01-11 DIAGNOSIS — Z89512 Acquired absence of left leg below knee: Secondary | ICD-10-CM

## 2024-01-11 DIAGNOSIS — Z7984 Long term (current) use of oral hypoglycemic drugs: Secondary | ICD-10-CM

## 2024-01-11 DIAGNOSIS — I70201 Unspecified atherosclerosis of native arteries of extremities, right leg: Secondary | ICD-10-CM

## 2024-01-11 DIAGNOSIS — Z79899 Other long term (current) drug therapy: Secondary | ICD-10-CM

## 2024-01-11 DIAGNOSIS — L089 Local infection of the skin and subcutaneous tissue, unspecified: Secondary | ICD-10-CM | POA: Diagnosis not present

## 2024-01-11 DIAGNOSIS — Z7901 Long term (current) use of anticoagulants: Secondary | ICD-10-CM

## 2024-01-11 HISTORY — PX: IRRIGATION AND DEBRIDEMENT FOOT: SHX6602

## 2024-01-11 LAB — CBC
HCT: 33.3 % — ABNORMAL LOW (ref 39.0–52.0)
Hemoglobin: 10.5 g/dL — ABNORMAL LOW (ref 13.0–17.0)
MCH: 28.2 pg (ref 26.0–34.0)
MCHC: 31.5 g/dL (ref 30.0–36.0)
MCV: 89.3 fL (ref 80.0–100.0)
Platelets: 165 K/uL (ref 150–400)
RBC: 3.73 MIL/uL — ABNORMAL LOW (ref 4.22–5.81)
RDW: 13.6 % (ref 11.5–15.5)
WBC: 7.3 K/uL (ref 4.0–10.5)
nRBC: 0 % (ref 0.0–0.2)

## 2024-01-11 LAB — BASIC METABOLIC PANEL WITH GFR
Anion gap: 11 (ref 5–15)
BUN: 13 mg/dL (ref 8–23)
CO2: 23 mmol/L (ref 22–32)
Calcium: 8.5 mg/dL — ABNORMAL LOW (ref 8.9–10.3)
Chloride: 107 mmol/L (ref 98–111)
Creatinine, Ser: 1.31 mg/dL — ABNORMAL HIGH (ref 0.61–1.24)
GFR, Estimated: 57 mL/min — ABNORMAL LOW (ref >60)
Glucose, Bld: 165 mg/dL — ABNORMAL HIGH (ref 70–99)
Potassium: 3.7 mmol/L (ref 3.5–5.1)
Sodium: 141 mmol/L (ref 135–145)

## 2024-01-11 LAB — GLUCOSE, CAPILLARY
Glucose-Capillary: 143 mg/dL — ABNORMAL HIGH (ref 70–99)
Glucose-Capillary: 146 mg/dL — ABNORMAL HIGH (ref 70–99)
Glucose-Capillary: 138 mg/dL — ABNORMAL HIGH (ref 70–99)
Glucose-Capillary: 128 mg/dL — ABNORMAL HIGH (ref 70–99)
Glucose-Capillary: 175 mg/dL — ABNORMAL HIGH (ref 70–99)
Glucose-Capillary: 95 mg/dL (ref 70–99)

## 2024-01-11 SURGERY — IRRIGATION AND DEBRIDEMENT FOOT
Anesthesia: General | Site: Foot | Laterality: Right

## 2024-01-11 MED ORDER — PROPOFOL 500 MG/50ML IV EMUL
INTRAVENOUS | Status: DC | PRN
  Administered 2024-01-11: 125 ug/kg/min via INTRAVENOUS

## 2024-01-11 MED ORDER — PROPOFOL 1000 MG/100ML IV EMUL
INTRAVENOUS | Status: AC
  Filled 2024-01-11: qty 100

## 2024-01-11 MED ORDER — FENTANYL CITRATE (PF) 100 MCG/2ML IJ SOLN
INTRAMUSCULAR | Status: DC | PRN
  Administered 2024-01-11: 25 ug via INTRAVENOUS

## 2024-01-11 MED ORDER — BUPIVACAINE HCL 0.5 % IJ SOLN
INTRAMUSCULAR | Status: DC | PRN
  Administered 2024-01-11: 5 mL

## 2024-01-11 MED ORDER — LIDOCAINE HCL (PF) 2 % IJ SOLN
INTRAMUSCULAR | Status: AC
  Filled 2024-01-11: qty 5

## 2024-01-11 MED ORDER — PROPOFOL 10 MG/ML IV BOLUS
INTRAVENOUS | Status: AC
  Filled 2024-01-11: qty 20

## 2024-01-11 MED ORDER — PHENYLEPHRINE 80 MCG/ML (10ML) SYRINGE FOR IV PUSH (FOR BLOOD PRESSURE SUPPORT)
PREFILLED_SYRINGE | INTRAVENOUS | Status: DC | PRN
  Administered 2024-01-11 (×3): 80 ug via INTRAVENOUS

## 2024-01-11 MED ORDER — PROPOFOL 10 MG/ML IV BOLUS
INTRAVENOUS | Status: DC | PRN
Start: 2024-01-11 — End: 2024-01-11
  Administered 2024-01-11: 50 mg via INTRAVENOUS

## 2024-01-11 MED ORDER — LIDOCAINE HCL (PF) 2 % IJ SOLN
INTRAMUSCULAR | Status: DC | PRN
  Administered 2024-01-11: 40 mg via INTRADERMAL

## 2024-01-11 MED ORDER — FENTANYL CITRATE (PF) 100 MCG/2ML IJ SOLN: 25.0000 ug | INTRAMUSCULAR | Status: DC | PRN

## 2024-01-11 MED ORDER — LIDOCAINE-EPINEPHRINE 1 %-1:100000 IJ SOLN
INTRAMUSCULAR | Status: AC
Start: 2024-01-11 — End: 2024-01-11
  Filled 2024-01-11: qty 1

## 2024-01-11 MED ORDER — 0.9 % SODIUM CHLORIDE (POUR BTL) OPTIME
TOPICAL | Status: DC | PRN
  Administered 2024-01-11: 1500 mL

## 2024-01-11 MED ORDER — ONDANSETRON HCL 4 MG/2ML IJ SOLN
INTRAMUSCULAR | Status: DC | PRN
  Administered 2024-01-11: 4 mg via INTRAVENOUS

## 2024-01-11 MED ORDER — MEDIHONEY WOUND/BURN DRESSING EX PSTE
1.0000 | PASTE | Freq: Every day | CUTANEOUS | Status: DC
  Administered 2024-01-13 – 2024-01-16 (×3): 1 via TOPICAL
  Filled 2024-01-11: qty 44

## 2024-01-11 MED ORDER — MIDAZOLAM HCL 2 MG/2ML IJ SOLN
INTRAMUSCULAR | Status: DC | PRN
  Administered 2024-01-11: 1 mg via INTRAVENOUS

## 2024-01-11 MED ORDER — SODIUM CHLORIDE 0.9 % IV SOLN
INTRAVENOUS | Status: DC | PRN
Start: 2024-01-11 — End: 2024-01-11

## 2024-01-11 MED ORDER — FENTANYL CITRATE (PF) 100 MCG/2ML IJ SOLN
INTRAMUSCULAR | Status: AC
  Filled 2024-01-11: qty 2

## 2024-01-11 MED ORDER — MIDAZOLAM HCL 2 MG/2ML IJ SOLN
INTRAMUSCULAR | Status: AC
  Filled 2024-01-11: qty 2

## 2024-01-11 MED ORDER — PHENYLEPHRINE 80 MCG/ML (10ML) SYRINGE FOR IV PUSH (FOR BLOOD PRESSURE SUPPORT)
PREFILLED_SYRINGE | INTRAVENOUS | Status: AC
  Filled 2024-01-11: qty 10

## 2024-01-11 MED ORDER — LIDOCAINE-EPINEPHRINE 1 %-1:100000 IJ SOLN
INTRAMUSCULAR | Status: DC | PRN
  Administered 2024-01-11: 5 mL

## 2024-01-11 SURGICAL SUPPLY — 60 items
BLADE OSC/SAGITTAL MD 5.5X18 (BLADE) IMPLANT
BLADE SURG 15 STRL LF DISP TIS (BLADE) ×1 IMPLANT
BLADE SW THK.38XMED LNG THN (BLADE) IMPLANT
BNDG COHESIVE 4X5 TAN STRL LF (GAUZE/BANDAGES/DRESSINGS) ×1 IMPLANT
BNDG COHESIVE 6X5 TAN ST LF (GAUZE/BANDAGES/DRESSINGS) ×1 IMPLANT
BNDG ELASTIC 4INX 5YD STR LF (GAUZE/BANDAGES/DRESSINGS) ×1 IMPLANT
BNDG ESMARCH 4X12 STRL LF (GAUZE/BANDAGES/DRESSINGS) ×1 IMPLANT
BNDG GAUZE DERMACEA FLUFF 4 (GAUZE/BANDAGES/DRESSINGS) ×1 IMPLANT
BNDG STRETCH GAUZE 3IN X12FT (GAUZE/BANDAGES/DRESSINGS) ×1 IMPLANT
CANISTER WOUND CARE 500ML ATS (WOUND CARE) ×1 IMPLANT
CNTNR URN SCR LID CUP LEK RST (MISCELLANEOUS) IMPLANT
CUFF TOURN SGL QUICK 12 (TOURNIQUET CUFF) IMPLANT
CUFF TOURN SGL QUICK 18X4 (TOURNIQUET CUFF) IMPLANT
DRAPE FLUOR MINI C-ARM 54X84 (DRAPES) IMPLANT
DRAPE XRAY CASSETTE 23X24 (DRAPES) IMPLANT
DRSG MEPILEX FLEX 3X3 (GAUZE/BANDAGES/DRESSINGS) IMPLANT
DRSG MEPILEX HEEL 8.7X9.1 (GAUZE/BANDAGES/DRESSINGS) IMPLANT
DURAPREP 26ML APPLICATOR (WOUND CARE) ×1 IMPLANT
ELECTRODE REM PT RTRN 9FT ADLT (ELECTROSURGICAL) ×1 IMPLANT
GAUZE PACKING 0.25INX5YD STRL (GAUZE/BANDAGES/DRESSINGS) ×1 IMPLANT
GAUZE SPONGE 4X4 12PLY STRL (GAUZE/BANDAGES/DRESSINGS) ×1 IMPLANT
GAUZE STRETCH 2X75IN STRL (MISCELLANEOUS) ×1 IMPLANT
GAUZE XEROFORM 1X8 LF (GAUZE/BANDAGES/DRESSINGS) ×1 IMPLANT
GLOVE BIO SURGEON STRL SZ7.5 (GLOVE) ×1 IMPLANT
GLOVE BIOGEL PI IND STRL 8 (GLOVE) ×1 IMPLANT
GOWN STRL REUS W/ TWL LRG LVL3 (GOWN DISPOSABLE) ×1 IMPLANT
GOWN STRL REUS W/TWL MED LVL3 (GOWN DISPOSABLE) ×1 IMPLANT
GOWN STRL REUS W/TWL XL LVL4 (GOWN DISPOSABLE) ×1 IMPLANT
HANDPIECE VERSAJET DEBRIDEMENT (MISCELLANEOUS) IMPLANT
IV NS 1000ML BAXH (IV SOLUTION) ×1 IMPLANT
KIT DRSG VAC SLVR GRANUFM (MISCELLANEOUS) ×1 IMPLANT
KIT TURNOVER KIT A (KITS) ×1 IMPLANT
LABEL OR SOLS (LABEL) ×1 IMPLANT
MANIFOLD NEPTUNE II (INSTRUMENTS) ×1 IMPLANT
NDL FILTER BLUNT 18X1 1/2 (NEEDLE) ×1 IMPLANT
NDL HYPO 25X1 1.5 SAFETY (NEEDLE) ×1 IMPLANT
NDL SAFETY ECLIPSE 18X1.5 (NEEDLE) IMPLANT
NEEDLE FILTER BLUNT 18X1 1/2 (NEEDLE) ×1 IMPLANT
NEEDLE HYPO 25X1 1.5 SAFETY (NEEDLE) ×1 IMPLANT
NS IRRIG 500ML POUR BTL (IV SOLUTION) ×1 IMPLANT
PACK EXTREMITY ARMC (MISCELLANEOUS) ×1 IMPLANT
PACKING GAUZE IODOFORM 1INX5YD (GAUZE/BANDAGES/DRESSINGS) ×1 IMPLANT
PAD ABD DERMACEA PRESS 5X9 (GAUZE/BANDAGES/DRESSINGS) ×1 IMPLANT
PENCIL SMOKE EVACUATOR (MISCELLANEOUS) ×1 IMPLANT
RASP SM TEAR CROSS CUT (RASP) IMPLANT
SHIELD FULL FACE ANTIFOG 7M (MISCELLANEOUS) ×1 IMPLANT
SOL .9 NS 3000ML IRR UROMATIC (IV SOLUTION) ×1 IMPLANT
STOCKINETTE IMPERVIOUS 9X36 MD (GAUZE/BANDAGES/DRESSINGS) ×1 IMPLANT
SUCTION TUBE FRAZIER 10FR DISP (SUCTIONS) IMPLANT
SUT VIC AB 3-0 SH 27X BRD (SUTURE) ×1 IMPLANT
SUTURE EHLN 3-0 FS-10 30 BLK (SUTURE) IMPLANT
SWAB CULTURE AMIES ANAERIB BLU (MISCELLANEOUS) IMPLANT
SYR 10ML LL (SYRINGE) ×1 IMPLANT
SYR 3ML LL SCALE MARK (SYRINGE) ×1 IMPLANT
TIP FAN IRRIG PULSAVAC PLUS (DISPOSABLE) ×1 IMPLANT
TRAP FLUID SMOKE EVACUATOR (MISCELLANEOUS) ×1 IMPLANT
TUBING CONNECTING 10 (TUBING) IMPLANT
ULTRASOUND BK UROLOGY (MISCELLANEOUS) ×1 IMPLANT
ULTRASOUND BK UROLOGY PROCEDUR (MISCELLANEOUS) IMPLANT
WATER STERILE IRR 500ML POUR (IV SOLUTION) ×1 IMPLANT

## 2024-01-11 NOTE — Progress Notes (Signed)
 Pt is off unit for Debridement procedure.  Pt is A&Ox4. Pt has signed consents. Pt is stable at this time.

## 2024-01-11 NOTE — Progress Notes (Signed)
 Date of Admission:  01/10/2024      ID: Andrew Harding is a 73 y.o. male  Principal Problem:   Foot infection Active Problems:   Benign essential hypertension   CKD stage 3a, GFR 45-59 ml/min (HCC)   Atrial fibrillation (HCC)   CAD S/P CABG x 4   Controlled type 2 diabetes mellitus with neuropathy (HCC)   S/P BKA (below knee amputation) unilateral, left (HCC)   PAD (peripheral artery disease) (HCC)    Subjective: Pt underwent debridement today In good spirits  Medications:   Influenza vac split trivalent PF  0.5 mL Intramuscular Tomorrow-1000   insulin  aspart  0-9 Units Subcutaneous TID WC   leptospermum manuka honey  1 Application Topical Daily   leptospermum manuka honey  1 Application Topical Daily   pneumococcal 20-valent conjugate vaccine  0.5 mL Intramuscular Tomorrow-1000    Objective: Vital signs in last 24 hours: Patient Vitals for the past 24 hrs:  BP Temp Temp src Pulse Resp SpO2  01/11/24 1245 (!) 181/82 -- -- -- 11 --  01/11/24 1225 (!) 173/65 98.1 F (36.7 C) Oral 66 16 98 %  01/11/24 1216 (!) 101/58 97.6 F (36.4 C) Oral (!) 57 16 100 %  01/11/24 1130 (!) 92/58 -- -- 77 (!) 21 100 %  01/11/24 1115 110/66 -- -- 76 19 100 %  01/11/24 1100 101/63 98.3 F (36.8 C) -- 78 (!) 27 100 %  01/11/24 0854 108/68 97.9 F (36.6 C) Temporal 78 18 100 %  01/11/24 0829 114/64 98.5 F (36.9 C) Oral 69 16 100 %  01/11/24 0523 113/65 98 F (36.7 C) -- 79 16 --  01/11/24 0100 110/69 98 F (36.7 C) -- 90 16 99 %  01/10/24 2008 107/65 98 F (36.7 C) Oral 67 16 100 %  01/10/24 1803 106/72 97.6 F (36.4 C) -- 73 16 100 %  01/10/24 1526 -- 97.9 F (36.6 C) Oral -- -- --     Lines and Device Date on insertion # of days DC  Chiropractor     Foley     ETT       PHYSICAL EXAM:  General: Alert, cooperative, no distress, appears stated age.  Heart: Regular rate and rhythm, no murmur, rub or gallop. Abdomen: Soft, non-tender,not distended. Bowel sounds  normal. No masses Extremities:rt foot surgical dresisng not removed Left BKA Skin: No rashes or lesions. Or bruising Lymph: Cervical, supraclavicular normal. Neurologic: Grossly non-focal  Lab Results    Latest Ref Rng & Units 01/11/2024    5:07 AM 01/10/2024   12:18 PM 12/21/2023   10:43 AM  CBC  WBC 4.0 - 10.5 K/uL 7.3  6.4  10.6   Hemoglobin 13.0 - 17.0 g/dL 89.4  88.3  89.5   Hematocrit 39.0 - 52.0 % 33.3  36.5  32.0   Platelets 150 - 400 K/uL 165  193  375        Latest Ref Rng & Units 01/11/2024    5:07 AM 01/10/2024   12:18 PM 11/07/2023    4:25 AM  CMP  Glucose 70 - 99 mg/dL 834  852  831   BUN 8 - 23 mg/dL 13  12  18    Creatinine 0.61 - 1.24 mg/dL 8.68  8.64  8.73   Sodium 135 - 145 mmol/L 141  140  137   Potassium 3.5 - 5.1 mmol/L 3.7  4.1  4.0   Chloride 98 - 111 mmol/L 107  107  106   CO2 22 - 32 mmol/L 23  23  24    Calcium 8.9 - 10.3 mg/dL 8.5  8.9  8.5       Microbiology: WC sent BC NG    Assessment/Plan: Diabetic foot infection with peripheral artery disease involving the right foot at the site of prior TMA Patient underwent debridement today with excision of the 4th and 5th metatarsal bones He has a complicated infectious history of the right foot His initial TMA was on 11/02/2023 and the culture then was MRSA and Enterococcus faecalis.  He had acute osteomyelitis of the bone and he was treated with 6 weeks of intravenous daptomycin  and completed around 12/21/2023 He then was on Levaquin and Augmentin  for Enterobacter cloacae and other organisms on 01/03/2019 5 repeat culture was done by Dr. Ashley and that had Enterobacter cloacae which is now resistant to oral medications The patient is currently on cefepime  and vancomycin  The bone has been sent for culture and biopsy today Await susceptibility before finalizing the antibiotics He is going to need 6 weeks of IV antibiotic  PAD Patient has undergone angio in July 2025 and also had mechanical thrombectomy of  the right anterior tibial artery, balloon angioplasty and stent placement.  He has also had stent placement in the right tibioperoneal trunk. Vascular has seen him this admission and they had ordered an ABI  History of left BKA  Diabetes mellitus  CAD status post CABG  A-fib well-controlled CKD creatinine is 1.35.  Observe closely and may have to change vancomycin  to Dapto if needed  Discussed the management with the patient and the hospitalist on podiatrist Discussed with her nurse Discussed with his friend Ron ID will follow him remotely this weekend.  On-call ID available by phone for urgent issue.  Call if needed.

## 2024-01-11 NOTE — Plan of Care (Signed)
   Problem: Education: Goal: Ability to describe self-care measures that may prevent or decrease complications (Diabetes Survival Skills Education) will improve Outcome: Progressing Goal: Individualized Educational Video(s) Outcome: Progressing   Problem: Coping: Goal: Ability to adjust to condition or change in health will improve Outcome: Progressing   Problem: Fluid Volume: Goal: Ability to maintain a balanced intake and output will improve Outcome: Progressing   Problem: Health Behavior/Discharge Planning: Goal: Ability to identify and utilize available resources and services will improve Outcome: Progressing Goal: Ability to manage health-related needs will improve Outcome: Progressing   Problem: Metabolic: Goal: Ability to maintain appropriate glucose levels will improve Outcome: Progressing   Problem: Nutritional: Goal: Maintenance of adequate nutrition will improve Outcome: Progressing Goal: Progress toward achieving an optimal weight will improve Outcome: Progressing   Problem: Skin Integrity: Goal: Risk for impaired skin integrity will decrease Outcome: Progressing   Problem: Tissue Perfusion: Goal: Adequacy of tissue perfusion will improve Outcome: Progressing   Problem: Education: Goal: Knowledge of General Education information will improve Description: Including pain rating scale, medication(s)/side effects and non-pharmacologic comfort measures Outcome: Progressing   Problem: Health Behavior/Discharge Planning: Goal: Ability to manage health-related needs will improve Outcome: Progressing   Problem: Clinical Measurements: Goal: Ability to maintain clinical measurements within normal limits will improve Outcome: Progressing Goal: Will remain free from infection Outcome: Progressing Goal: Diagnostic test results will improve Outcome: Progressing Goal: Respiratory complications will improve Outcome: Progressing Goal: Cardiovascular complication will  be avoided Outcome: Progressing   Problem: Activity: Goal: Risk for activity intolerance will decrease Outcome: Progressing   Problem: Nutrition: Goal: Adequate nutrition will be maintained Outcome: Progressing   Problem: Coping: Goal: Level of anxiety will decrease Outcome: Progressing   Problem: Elimination: Goal: Will not experience complications related to bowel motility Outcome: Progressing Goal: Will not experience complications related to urinary retention Outcome: Progressing   Problem: Pain Managment: Goal: General experience of comfort will improve and/or be controlled Outcome: Progressing   Problem: Safety: Goal: Ability to remain free from injury will improve Outcome: Progressing   Problem: Skin Integrity: Goal: Risk for impaired skin integrity will decrease Outcome: Progressing   Problem: Education: Goal: Knowledge of disease or condition will improve Outcome: Progressing Goal: Understanding of medication regimen will improve Outcome: Progressing Goal: Individualized Educational Video(s) Outcome: Progressing   Problem: Activity: Goal: Ability to tolerate increased activity will improve Outcome: Progressing   Problem: Cardiac: Goal: Ability to achieve and maintain adequate cardiopulmonary perfusion will improve Outcome: Progressing   Problem: Health Behavior/Discharge Planning: Goal: Ability to safely manage health-related needs after discharge will improve Outcome: Progressing

## 2024-01-11 NOTE — Progress Notes (Signed)
 PROGRESS NOTE    THAXTON Harding  FMW:[id number] DOB: 06/13/1950 DOA: 01/10/2024 PCP: Fernande Ophelia [name], MD     Brief Narrative:   From admission h and p  Andrew Harding Feeling is a 73 y.o. male with medical history significant for  DM II, peripheral neuropathy,CAD, s/p CABG, HTN, Atrial fibrillation with eliquis  for stroke prophylaxis, s/p right great toe amputation.  PAD s/p stent to rt ant tibial artery, s/p percutaneous angioplasty of the right mid popliteal artery and the right tibioperoneal trunk followed by TMA on the right on 11/02/23, and left BKA. Pathology from TMA was positive for osteomyelitis but margin was clear of osteo. Cultures from surgery grew out MRSA and enterococcus fecalis. He received treatment with 4 weeks of IV daptomycin  which was extended by two more weeks to complete 6 weeks of therapy on 12/14/2023.  The patient presented to podiatrists office on the morning of 01/10/1024 with complaints of worsening infection to his right foot. He was found to have drainage to the distal lateral aspect of the right foot from the most recent transmetatarsal amputation. MDRO enterobacter has been grown from this drainage. Susceptibilities demonstrated sensitivity to amikacin, aztreonam, cefepime , imipenem and meropenem . There was frank purulence from the wound today.    PICC line was placed prior to presentation in ED in anticipation of another course of outpatient antibiotics. He was sent to the ED for admission. I & D by podiatry has been scheduled for 01/11/2024.   The patient denies pain (he has severe neuropathy in that limb), fevers, chills, nausea, vomiting, diarrhea, constipation, cough, chest pain, neurological deficits, confusion, headaches.  Assessment & Plan:   Principal Problem:   Foot infection Active Problems:   Atrial fibrillation (HCC)   Benign essential hypertension   CKD stage 3a, GFR 45-59 ml/min (HCC)   CAD S/P CABG x 4   Controlled type 2 diabetes mellitus with neuropathy  (HCC)   S/P BKA (below knee amputation) unilateral, left (HCC)   PAD (peripheral artery disease) (HCC)   # Right foot osteomyelitis Complicated recent infectious course, see ID note for details. TMA performed 7/4, revision 8/22. 9/4 culture growing e faecalis, mdr. S/p excision of bone right 4th and 5th metatarsals today for ongoing osteo there with podiatry. Intra-op culture obtained - follow cultures - abx per ID currently on vanc/cefepime  - wound care per podiatry - pt/ot consult (patient lives alone)  # PAD S/p thrombectomy, angioplasty, and stent placement right leg on 7/7 with dr. Marea. Vascular has been consulted to ensure patient is optimized - f/u vascular recs - home plavix   # Chronic pain - home gabapentin   # CAD S/p cabg x4, asymptomatic - home plavix , metoprolol , statin  # HTN Bp well controlled - home metop  # A-fib, paroxysmal Rate controlled - home BB - per podiatry likely ok to resume apixaban  tomorrow  # T2DM Euglycemic - SSI  DVT prophylaxis: SCDs Code Status: full Family Communication: friend updated @ bedside  Level of care: Telemetry Surgical Status is: Inpatient Remains inpatient appropriate because: ongoing inpatinet evaluation and treatment    Consultants:  ID, podiatry  Procedures: See above  Antimicrobials:  See above    Subjective: Mild right foot pain  Objective: Vitals:   01/11/24 1115 01/11/24 1130 01/11/24 1216 01/11/24 1225  BP: 110/66 (!) 92/58 (!) 101/58 (!) 173/65  Pulse: 76 77 (!) 57 66  Resp: 19 (!) 21 16 16   Temp:   97.6 F (36.4 C) 98.1 F (36.7 C)  TempSrc:   Oral Oral  SpO2: 100% 100% 100% 98%  Weight:      Height:        Intake/Output Summary (Last 24 hours) at 01/11/2024 1230 Last data filed at 01/11/2024 1101 Gross per 24 hour  Intake 1097.59 ml  Output 5 ml  Net 1092.59 ml   Filed Weights   01/10/24 1112  Weight: 82.6 kg    Examination:  General exam: Appears calm and comfortable   Respiratory system: Clear to auscultation. Respiratory effort normal. Cardiovascular system: S1 & S2 heard, RR  Gastrointestinal system: Abdomen is nondistended, soft and nontender.   Central nervous system: Alert and oriented. No focal neurological deficits. Extremities: Symmetric 5 x 5 power. Bandage over right foot. S/p left BKA Skin: bandage right foot Psychiatry: Judgement and insight appear normal. Mood & affect appropriate.     Data Reviewed: I have personally reviewed following labs and imaging studies  CBC: Recent Labs  Lab 01/10/24 1218 01/11/24 0507  WBC 6.4 7.3  NEUTROABS 4.5  --   HGB 11.6* 10.5*  HCT 36.5* 33.3*  MCV 89.7 89.3  PLT 193 165   Basic Metabolic Panel: Recent Labs  Lab 01/10/24 1218 01/10/24 1247 01/11/24 0507  NA 140  --  141  K 4.1  --  3.7  CL 107  --  107  CO2 23  --  23  GLUCOSE 147*  --  165*  BUN 12  --  13  CREATININE 1.35*  --  1.31*  CALCIUM 8.9  --  8.5*  MG  --  2.0  --    GFR: Estimated Creatinine Clearance: 57.8 mL/min (A) (by C-G formula based on SCr of 1.31 mg/dL (H)). Liver Function Tests: No results for input(s): AST, ALT, ALKPHOS, BILITOT, PROT, ALBUMIN in the last 168 hours. No results for input(s): LIPASE, AMYLASE in the last 168 hours. No results for input(s): AMMONIA in the last 168 hours. Coagulation Profile: No results for input(s): INR, PROTIME in the last 168 hours. Cardiac Enzymes: No results for input(s): CKTOTAL, CKMB, CKMBINDEX, TROPONINI in the last 168 hours. BNP (last 3 results) No results for input(s): PROBNP in the last 8760 hours. HbA1C: No results for input(s): HGBA1C in the last 72 hours. CBG: Recent Labs  Lab 01/10/24 1804 01/11/24 0915 01/11/24 1105 01/11/24 1216  GLUCAP 191* 146* 138* 128*   Lipid Profile: No results for input(s): CHOL, HDL, LDLCALC, TRIG, CHOLHDL, LDLDIRECT in the last 72 hours. Thyroid Function Tests: No results for  input(s): TSH, T4TOTAL, FREET4, T3FREE, THYROIDAB in the last 72 hours. Anemia Panel: No results for input(s): VITAMINB12, FOLATE, FERRITIN, TIBC, IRON , RETICCTPCT in the last 72 hours. Urine analysis:    Component Value Date/Time   COLORURINE YELLOW (A) 05/10/2023 0937   APPEARANCEUR HAZY (A) 05/10/2023 0937   APPEARANCEUR Hazy (A) 05/30/2022 1026   LABSPEC 1.017 05/10/2023 0937   PHURINE 5.0 05/10/2023 0937   GLUCOSEU 50 (A) 05/10/2023 0937   HGBUR MODERATE (A) 05/10/2023 0937   BILIRUBINUR NEGATIVE 05/10/2023 0937   BILIRUBINUR Negative 05/30/2022 1026   KETONESUR NEGATIVE 05/10/2023 0937   PROTEINUR 100 (A) 05/10/2023 0937   UROBILINOGEN 0.2 04/05/2018 1042   NITRITE NEGATIVE 05/10/2023 0937   LEUKOCYTESUR NEGATIVE 05/10/2023 0937   Sepsis Labs: @LABRCNTIP (procalcitonin:4,lacticidven:4)  ) Recent Results (from the past 240 hours)  Culture, blood (Routine X 2) w Reflex to ID Panel     Status: None (Preliminary result)   Collection Time: 01/10/24 12:33 PM   Specimen:  BLOOD  Result Value Ref Range Status   Specimen Description BLOOD RIGHT ANTECUBITAL  Final   Special Requests   Final    BOTTLES DRAWN AEROBIC AND ANAEROBIC Blood Culture results may not be optimal due to an inadequate volume of blood received in culture bottles   Culture   Final    NO GROWTH < 24 HOURS Performed at [facility], [address] [place], [place] [address]    Report Status PENDING  Incomplete  Blood culture (single)     Status: None (Preliminary result)   Collection Time: 01/10/24  1:28 PM   Specimen: BLOOD  Result Value Ref Range Status   Specimen Description BLOOD BLOOD LEFT HAND  Final   Special Requests   Final    BOTTLES DRAWN AEROBIC AND ANAEROBIC Blood Culture adequate volume   Culture   Final    NO GROWTH < 24 HOURS Performed at [facility], [address] [place], [place] [address]    Report Status PENDING  Incomplete          Radiology Studies: DG MINI C-ARM IMAGE ONLY Result Date: 01/11/2024 There is no interpretation for this exam.  This order is for images obtained during a surgical procedure.  Please See Surgeries Tab for more information regarding the procedure.        Scheduled Meds:  Influenza vac split trivalent PF  0.5 mL Intramuscular Tomorrow-1000   insulin  aspart  0-9 Units Subcutaneous TID WC   leptospermum manuka honey  1 Application Topical Daily   leptospermum manuka honey  1 Application Topical Daily   pneumococcal 20-valent conjugate vaccine  0.5 mL Intramuscular Tomorrow-1000   Continuous Infusions:  ceFEPime  (MAXIPIME ) IV Stopped (01/11/24 9790)   vancomycin  150 mL/hr at 01/11/24 0309     LOS: 1 day     Devaughn KATHEE Ban, MD [facility]   If 7PM-7AM, please contact night-coverage [email or web address] Password [facility] 01/11/2024, 12:30 PM

## 2024-01-11 NOTE — Transfer of Care (Signed)
 Immediate Anesthesia Transfer of Care Note  Patient: [name] Feeling  Procedure(s) Performed: IRRIGATION AND DEBRIDEMENT FOOT (Right: Foot)  Patient Location: PACU  Anesthesia Type:General  Level of Consciousness: drowsy  Airway & Oxygen Therapy: Patient Spontanous Breathing and Patient connected to face mask oxygen  Post-op Assessment: Report given to RN, Post -op Vital signs reviewed and stable, and Patient moving all extremities  Post vital signs: Reviewed and stable  Last Vitals:  Vitals Value Taken Time  BP 101/63 01/11/24 11:01  Temp 36.8 C 01/11/24 11:00  Pulse 75 01/11/24 11:02  Resp 17 01/11/24 11:02  SpO2 100 % 01/11/24 11:02  Vitals shown include unfiled device data.  Last Pain:  Vitals:   01/11/24 1100  TempSrc:   PainSc: Asleep      Patients Stated Pain Goal: 0 (01/11/24 0025)  Complications: No notable events documented.

## 2024-01-11 NOTE — Op Note (Signed)
 Operative note   Surgeon:Sasuke Yaffe [employer] forces logistics/support/administrative officer: None    Preop diagnosis: Osteomyelitis right 4th and 5th metatarsals    Postop diagnosis: Same    Procedure: 1.  Excision bone right fourth metatarsal 2.  Excision bone right fifth metatarsal    EBL: Minimal    Anesthesia: IV with local.  Local consisted of a total of 10 cc of one-to-one mixture of 1% lidocaine  with epinephrine  and 0.5% bupivacaine  plain    Hemostasis: Lidocaine  with epinephrine  along the incision site    Specimen: Swab for deep wound culture, bone from fifth metatarsal for culture, bone from 4th and 5th metatarsal for pathology    Complications: None    Operative indications:Andrew Harding is an 73 y.o. that presents today for surgical intervention.  The risks/benefits/alternatives/complications have been discussed and consent has been given.    Procedure:  Patient was brought into the OR and placed on the operating table in thesupine position. After anesthesia was obtained theright lower extremity was prepped and draped in usual sterile fashion.  Attention was directed to the dorsal lateral aspect of the right foot where previous surgery had been performed.  Noted drainage from the 4th and 5th metatarsal region of the previous transmetatarsal amputation site was noted.  The previous wound was then entered by removal and sutures.  Full-thickness blunt dissection carried down to the bone.  The wound was initially flushed with copious amounts of irrigation.  Small portion of the distal fifth metatarsal was then removed and sent for culture.  Bone from the 4th and 5th metatarsal were then removed and sent for pathological examination.  The fifth metatarsal bone was noted to be quite soft.  At this time all soft tissue and bone was then debrided with a Versajet.  Further flush was performed with bulb syringe.  No further signs of deep infection at this time.  The incision was then closed with nylon.  Scant area of opening was  left at the most distal lateral incision for drainage.  A large bulky sterile dressing was applied.  Padded foam dressings were applied to the medial lateral ankle and posterior heel at pressure ulceration sites.    Patient tolerated the procedure and anesthesia well.  Was transported from the OR to the PACU with all vital signs stable and vascular status intact. To be discharged per routine protocol.  Will follow up in approximately 1 week in the outpatient clinic.

## 2024-01-11 NOTE — Anesthesia Postprocedure Evaluation (Signed)
 Anesthesia Post Note  [name]  Procedure(s) Performed: IRRIGATION AND DEBRIDEMENT FOOT (Right: Foot)  Patient location during evaluation: PACU Anesthesia Type: General Level of consciousness: awake and alert Pain management: pain level controlled Vital Signs Assessment: post-procedure vital signs reviewed and stable Respiratory status: spontaneous breathing, nonlabored ventilation, respiratory function stable and patient connected to nasal cannula oxygen Cardiovascular status: blood pressure returned to baseline and stable Postop Assessment: no apparent nausea or vomiting Anesthetic complications: no   No notable events documented.   Last Vitals:  Vitals:   01/11/24 1115 01/11/24 1130  BP: 110/66 (!) 92/58  Pulse: 76 77  Resp: 19 (!) 21  Temp:    SpO2: 100% 100%    Last Pain:  Vitals:   01/11/24 1100  TempSrc:   PainSc: Asleep                 Prentice Murphy

## 2024-01-11 NOTE — Consult Note (Signed)
 Hospital Consult    Reason for Consult:  Right Lower Extremity Non Healing TMA  Requesting Physician:  Dr Eva Gay MD MRN #:  [id number]  History of Present Illness: This is a 73 y.o. male with a history of Atrial fibrillation with eliquis  for stroke prophylaxis, s/p right great toe amputation. PAD s/p stent to rt ant tibial artery, s/p percutaneous angioplasty of the right mid popliteal artery and the right tibioperoneal trunk followed by TMA on the right on 11/02/23, and left BKA. The patient presented to podiatrists office on the morning of 01/10/1024 with complaints of worsening infection to his right foot. He was found to have drainage to the distal lateral aspect of the right foot from the most recent transmetatarsal amputation. He was sent to the ED for admission. I & D by podiatry has been scheduled for 01/11/2024 and completed. Dr Gay consulted Vascular Surgery due to the difficulty to heal the surgical incision.    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) 09/03/2023   Atrial fibrillation with RVR (HCC) 08/19/2022   Bladder neck obstruction    Cellulitis 08/17/2022   Chronic kidney disease    Coronary artery disease    a.) s/p 4v CABG in 2014   Diabetes mellitus without complication (HCC)    Diabetic neuropathy (HCC)    Diabetic peripheral neuropathy (HCC)    Diabetic ulcer of toe of left foot associated with diabetes mellitus of other type, limited to breakdown of skin (HCC) 12/21/2017   Diverticulosis    Gout    Gram-negative bacteremia 05/11/2023   Heart murmur    History of osteomyelitis 05/31/2015   Hypercholesteremia    Hyperlipidemia    Hypertension    Hypotension due to hypovolemia 08/17/2022   Infection of left foot 08/19/2022   MSSA bacteremia 02/28/2023   Open wound of left  foot with complication 05/10/2023   Osteomyelitis (HCC) 05/31/2015   Peripheral neuropathy    Postural dizziness with presyncope 02/26/2023   S/P BKA (below knee amputation) unilateral, 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   S/P transmetatarsal amputation of foot, left (HCC) 08/29/2022   Sepsis (HCC) 08/17/2022   Sepsis (HCC) 05/10/2023   Status post amputation of toe of right foot (HCC) 11/01/2015   Tubular adenoma    Vitamin D  deficiency     Past Surgical History:  Procedure Laterality Date   ABDOMINAL AORTOGRAM W/LOWER EXTREMITY N/A 11/05/2023   Procedure: ABDOMINAL AORTOGRAM W/LOWER EXTREMITY;  Surgeon: Marea Selinda RAMAN, MD;  Location: [facility];  Service: Cardiovascular;  Laterality: N/A;   ACHILLES TENDON SURGERY Right 09/14/2023   Procedure: TENOTOMY, ACHILLES;  Surgeon: Gay Eva, DPM;  Location: [facility];  Service: Orthopedics/Podiatry;  Laterality: Right;   AMPUTATION Left 08/19/2022   Procedure: TRANSMETATARSAL AMPUTATION LEFT FOOT WITH IRRIGATION AND DEBRIDEMENT;  Surgeon: Lennie Barter, DPM;  Location: [facility];  Service: Podiatry;  Laterality: Left;   AMPUTATION Left 05/16/2023   Procedure: AMPUTATION BELOW KNEE;  Surgeon: Marea Selinda RAMAN, MD;  Location: [facility];  Service: General;  Laterality: Left;   AMPUTATION Right 09/14/2023   Procedure: AMPUTATION, FOOT, RAY;  Surgeon: Gay Eva, DPM;  Location: [facility];  Service: Orthopedics/Podiatry;  Laterality: Right;   AMPUTATION TOE Right 06/01/2015   Procedure: AMPUTATION TOE;  Surgeon: Donnice Cory, DPM;  Location: [facility];  Service: Podiatry;  Laterality: Right;   AMPUTATION TOE Left 08/11/2022   Procedure: AMPUTATION TOE 2, 3, 4;  Surgeon: [name], DPM;  Location: [facility];  Service: Podiatry;  Laterality: Left;   CATARACT EXTRACTION, BILATERAL     CIRCUMCISION N/A 06/12/2022   Procedure: CIRCUMCISION ADULT;  Surgeon: Penne Knee, MD;  Location: [facility];  Service: Urology;  Laterality: N/A;    COLONOSCOPY WITH PROPOFOL  N/A 02/14/2016   Procedure: COLONOSCOPY WITH PROPOFOL ;  Surgeon: Gladis RAYMOND Mariner, MD;  Location: [facility];  Service: Endoscopy;  Laterality: N/A;   COLONOSCOPY WITH PROPOFOL  N/A 01/07/2019   Procedure: COLONOSCOPY WITH PROPOFOL ;  Surgeon: Mariner Gladis RAYMOND, MD;  Location: [facility] ENDOSCOPY;  Service: Endoscopy;  Laterality: N/A;   CORONARY ARTERY BYPASS GRAFT N/A 08/2012   EXCISION PARTIAL PHALANX Right 06/01/2015   Procedure: EXCISION PARTIAL PHALANX /  BONE;  Surgeon: Donnice Cory, DPM;  Location: [facility];  Service: Podiatry;  Laterality: Right;   FLEXIBLE SIGMOIDOSCOPY N/A 05/29/2016   Procedure: FLEXIBLE SIGMOIDOSCOPY;  Surgeon: Gladis RAYMOND Mariner, MD;  Location: [facility]COPY;  Service: Endoscopy;  Laterality: N/A;   INCISION AND DRAINAGE Left 08/23/2022   Procedure: INCISION AND DRAINAGE;  Surgeon: [name], DPM;  Location: [facility];  Service: Podiatry;  Laterality: Left;   INCISION AND DRAINAGE OF WOUND Left 09/15/2022   Procedure: 11044 - DEBRIDE BONE and EXCISION IF 1ST METATARSAL BONE WITH  DELAY PRIMARY CLOSURE;  Surgeon: [name], DPM;  Location: [facility];  Service: Podiatry;  Laterality: Left;   IRRIGATION AND DEBRIDEMENT FOOT Left 02/28/2023   Procedure: IRRIGATION AND DEBRIDEMENT FOOT;  Surgeon: [name], DPM;  Location: [facility];  Service: Orthopedics/Podiatry;  Laterality: Left;   KNEE ARTHROSCOPY Left    LOWER EXTREMITY ANGIOGRAPHY Left 08/22/2022   Procedure: Lower Extremity Angiography;  Surgeon: Jama Cordella MATSU, MD;  Location: [facility];  Service: Cardiovascular;  Laterality: Left;   LOWER EXTREMITY ANGIOGRAPHY Left 05/11/2023   Procedure: Lower Extremity Angiography;  Surgeon: Marea Selinda RAMAN, MD;  Location: [facility];  Service: Cardiovascular;  Laterality: Left;   LOWER EXTREMITY ANGIOGRAPHY Right 09/11/2023   Procedure: Lower Extremity Angiography;  Surgeon: Jama Cordella MATSU, MD;  Location: [facility];  Service: Cardiovascular;  Laterality: Right;   LOWER EXTREMITY INTERVENTION Right 09/11/2023   Procedure: LOWER EXTREMITY INTERVENTION;  Surgeon: Jama Cordella MATSU, MD;  Location: [facility];  Service: Cardiovascular;  Laterality: Right;   TEE WITHOUT CARDIOVERSION N/A 03/01/2023   Procedure: TRANSESOPHAGEAL ECHOCARDIOGRAM;  Surgeon: Alluri, Keller BROCKS, MD;  Location: [facility];  Service: Cardiovascular;  Laterality: N/A;   TRANSMETATARSAL AMPUTATION Right 11/02/2023   Procedure: AMPUTATION, FOOT, TRANSMETATARSAL;  Surgeon: [name], DPM;  Location: [facility];  Service: Orthopedics/Podiatry;  Laterality: Right;   TRANSMETATARSAL AMPUTATION Right 12/21/2023   Procedure: REVISION, AMPUTATION, FOOT, TRANSMETATARSAL;  Surgeon: [name], DPM;  Location: [facility];  Service: Orthopedics/Podiatry;  Laterality: Right;   WOUND DEBRIDEMENT Left 09/15/2022   Procedure: 11043 - DEBRIDE SKIN. MUSCLE FASCIA;  Surgeon: [name], DPM;  Location: [facility];  Service: Podiatry;  Laterality: Left;    No Known Allergies  Prior to Admission medications   Medication Sig Start Date End Date Taking? Authorizing Provider  clopidogrel  (PLAVIX ) 75 MG tablet TAKE 1 TABLET BY MOUTH EVERY DAY WITH BREAKFAST 11/26/23  Yes Brown, Fallon E, NP  cyanocobalamin  (VITAMIN B12) 1000 MCG tablet Take 1 tablet (1,000 mcg total) by mouth daily. 11/08/23  Yes Laurita Pillion, MD  Dulaglutide  (TRULICITY ) 0.75 MG/0.5ML SOAJ Inject 0.75 mg into the skin once a week. Every Monday   Yes [provider]  ELIQUIS  5 MG TABS tablet TAKE 1 TABLET(5 MG) BY MOUTH TWICE DAILY 08/27/23  Yes Brown, Fallon E, NP  gabapentin  (NEURONTIN ) 300 MG capsule Take 1 capsule (300 mg total) by mouth 3 (three) times daily. 09/05/22  Yes Love, Sharlet RAMAN, PA-C  insulin  glargine (LANTUS ) 100 UNIT/ML Solostar Pen Inject 28 Units into the skin at bedtime. Patient taking differently: Inject 28 Units into the skin daily. 11/08/23  Yes  Laurita Pillion, MD  metoprolol  tartrate (LOPRESSOR ) 25 MG tablet Take 1 tablet (25 mg total) by mouth 2 (two) times daily. Patient taking differently: Take 12.5 mg by mouth 2 (two) times daily. 09/05/22  Yes Love, Sharlet RAMAN, PA-C  mupirocin  ointment (BACTROBAN ) 2 % Place 1 Application into the nose 2 (two) times daily for 60 doses. Use as directed 2 times daily for 5 days every other week for 6 weeks. 12/21/23 01/20/24 Yes [name], DPM  pravastatin  (PRAVACHOL ) 80 MG tablet Take 80 mg by mouth daily. 05/30/23  Yes [provider]  traZODone  (DESYREL ) 50 MG tablet Take 50 mg by mouth at bedtime. 10/21/18  Yes [provider]  amoxicillin -clavulanate (AUGMENTIN ) 875-125 MG tablet Take 1 tablet by mouth 2 (two) times daily. Patient not taking: Reported on 01/10/2024 12/21/23   [name], DPM  chlorhexidine  (HIBICLENS ) 4 % external liquid Apply 15 mLs (1 Application total) topically as directed for 30 doses. Use as directed daily for 5 days every other week for 6 weeks. Patient not taking: Reported on 01/10/2024 09/14/23   [name], DPM  chlorhexidine  (HIBICLENS ) 4 % external liquid Apply 15 mLs (1 Application total) topically as directed for 30 doses. Use as directed daily for 5 days every other week for 6 weeks. Patient not taking: Reported on 01/10/2024 12/21/23   [name], DPM  Continuous Glucose Sensor (FREESTYLE LIBRE 2 SENSOR) MISC  09/05/22   [provider]  docusate sodium  (COLACE) 100 MG capsule Take 1 capsule (100 mg total) by mouth 2 (two) times daily. Patient not taking: Reported on 01/10/2024 09/05/22   Love, Sharlet RAMAN, PA-C  glucose blood (ONETOUCH ULTRA) test strip Use 3 (three) times daily 09/09/18   [provider]  Insulin  Pen Needle (FIFTY50 PEN NEEDLES) 32G X 4 MM MISC USE 4 TIMES DAILY 07/29/18   [provider]  iron  polysaccharides (NIFEREX) 150 MG capsule Take 1 capsule (150 mg total) by mouth daily. Patient not taking: Reported on  01/10/2024 11/09/23   Laurita Pillion, MD  metFORMIN  (GLUCOPHAGE -XR) 500 MG 24 hr tablet Take 1,000 mg by mouth 2 (two) times daily with a meal. Patient not taking: Reported on 01/10/2024    [provider]  oxyCODONE  (OXY IR/ROXICODONE ) 5 MG immediate release tablet Take 1 tablet (5 mg total) by mouth every 6 (six) hours as needed for moderate pain (pain score 4-6). SNF use only.  Refills per SNF providers Patient not taking: Reported on 01/10/2024 11/08/23   Laurita Pillion, MD  OZEMPIC, 0.25 OR 0.5 MG/DOSE, 2 MG/3ML SOPN Inject 0.75 mLs into the skin once a week. Patient not taking: Reported on 01/10/2024    [provider]  Semaglutide, 1 MG/DOSE, (OZEMPIC, 1 MG/DOSE,) 4 MG/3ML SOPN Inject 1 mg into the skin every 7 (seven) days. Patient not taking: Reported on 01/10/2024    [provider]    Social  History   Socioeconomic History   Marital status: Divorced    Spouse name: Ashley,Angela C   Number of children: Not on file   Years of education: Not on file   Highest education level: Not on file  Occupational History   Not on file  Tobacco Use   Smoking status: Former    Types: Cigars   Smokeless tobacco: Never  Vaping Use   Vaping status: Never Used  Substance and Sexual Activity   Alcohol use: No   Drug use: No   Sexual activity: Never  Other Topics Concern   Not on file  Social History Narrative   Patient currently resides at    [address] [facility]   [address]  [place], [place]  [phone]      Patient is legally separated and lives at home by himself. His ex -wife is his HCPOA. Neighbors and friends are his support system. He used to work for [employer]    Social Drivers of Corporate investment banker Strain: Low Risk  (12/13/2022)   Received from [facility]   Overall Financial Resource Strain (CARDIA)    Difficulty of Paying Living Expenses: Not hard at all  Food Insecurity: No Food Insecurity (01/10/2024)    Hunger Vital Sign    Worried About Running Out of Food in the Last Year: Never true    Ran Out of Food in the Last Year: Never true  Transportation Needs: No Transportation Needs (01/10/2024)   PRAPARE - Administrator, Civil Service (Medical): No    Lack of Transportation (Non-Medical): No  Physical Activity: Not on file  Stress: Not on file  Social Connections: Moderately Isolated (01/10/2024)   Social Connection and Isolation Panel    Frequency of Communication with Friends and Family: More than three times a week    Frequency of Social Gatherings with Friends and Family: Twice a week    Attends Religious Services: More than 4 times per year    Active Member of [employer] or Organizations: No    Attends Banker Meetings: Never    Marital Status: Divorced  Catering manager Violence: Not At Risk (01/10/2024)   Humiliation, Afraid, Rape, and Kick questionnaire    Fear of Current or Ex-Partner: No    Emotionally Abused: No    Physically Abused: No    Sexually Abused: No     Family History  Problem Relation Age of Onset   Diabetes Mellitus II Mother    CAD Mother     ROS: Otherwise negative unless mentioned in HPI  Physical Examination  Vitals:   01/11/24 1225 01/11/24 1245  BP: (!) 173/65 (!) 181/82  Pulse: 66   Resp: 16 11  Temp: 98.1 F (36.7 C)   SpO2: 98%    Body mass index is 22.75 kg/m.  General:  WDWN in NAD Gait: Not observed HENT: WNL, normocephalic Pulmonary: normal non-labored breathing, without Rales, rhonchi,  wheezing Cardiac: regular, without  Murmurs, rubs or gallops; without carotid bruits Abdomen: Positive bowel sounds throughout,  soft, NT/ND, no masses Skin: without rashes Vascular Exam/Pulses: Positive PT pulses by doppler Extremities: without ischemic changes, without Gangrene , without cellulitis; without open wounds; Positive surgical wound.  Musculoskeletal: no muscle wasting or atrophy  Neurologic: A&O X 3;  No focal  weakness or paresthesias are detected; speech is fluent/normal Psychiatric:  The pt has Normal affect. Lymph:  Unremarkable  CBC    Component Value  Date/Time   WBC 7.3 01/11/2024 0507   RBC 3.73 (L) 01/11/2024 0507   HGB 10.5 (L) 01/11/2024 0507   HCT 33.3 (L) 01/11/2024 0507   PLT 165 01/11/2024 0507   MCV 89.3 01/11/2024 0507   MCH 28.2 01/11/2024 0507   MCHC 31.5 01/11/2024 0507   RDW 13.6 01/11/2024 0507   LYMPHSABS 1.2 01/10/2024 1218   MONOABS 0.4 01/10/2024 1218   EOSABS 0.3 01/10/2024 1218   BASOSABS 0.1 01/10/2024 1218    BMET    Component Value Date/Time   NA 141 01/11/2024 0507   K 3.7 01/11/2024 0507   CL 107 01/11/2024 0507   CO2 23 01/11/2024 0507   GLUCOSE 165 (H) 01/11/2024 0507   GLUCOSE 402 (H) 09/17/2012 1417   BUN 13 01/11/2024 0507   CREATININE 1.31 (H) 01/11/2024 0507   CALCIUM 8.5 (L) 01/11/2024 0507   GFRNONAA 57 (L) 01/11/2024 0507   GFRAA 58 (L) 06/02/2015 0656    COAGS: Lab Results  Component Value Date   INR 1.3 (H) 05/10/2023   INR 1.6 (H) 02/27/2023   INR 1.6 (H) 02/26/2023     Non-Invasive Vascular Imaging:   Right Lower Extremity ABI Ordered  Statin:  Yes.   Beta Blocker:  Yes.   Aspirin :  Yes.   ACEI:  No. ARB:  No. CCB use:  No Other antiplatelets/anticoagulants:  Yes.   Plavix  75 mg daily, Eliquis  5 mg BID   ASSESSMENT/PLAN: This is a 73 y.o. male Presented to [facility] by Podiatry for revision of patients prior TMA that was not healing. Post Surgical revision of the patient right foot Dr Eva Gay consulted Vascular Surgery due to the patients inability to heal his right foot and a decrease in bleeding during the operation.   Vascular surgery recommends getting ABI to the patients right lower extremity. Patient has had multiple angiograms to his right lower extremity on 09/11/23 and 11/05/23 with stent placement. If the ABI reveals any reduction in arterial blood flow we will consider another angiogram.     -I discussed the the case in detail with Dr cordella Shawl MD    [name] [facility] 01/11/2024 3:43 PM

## 2024-01-11 NOTE — Anesthesia Preprocedure Evaluation (Signed)
 Anesthesia Evaluation  Patient identified by MRN, date of birth, ID band Patient awake    Reviewed: Allergy & Precautions, NPO status , Patient's Chart, lab work & pertinent test results  History of Anesthesia Complications Negative for: history of anesthetic complications  Airway Mallampati: III  TM Distance: <3 FB Neck ROM: full    Dental  (+) Chipped, Dental Advidsory Given   Pulmonary neg shortness of breath, former smoker   Pulmonary exam normal        Cardiovascular Exercise Tolerance: Good hypertension, + CAD, + CABG and + Peripheral Vascular Disease  (-) Past MI + dysrhythmias Atrial Fibrillation + Valvular Problems/Murmurs      Neuro/Psych  Neuromuscular disease  negative psych ROS   GI/Hepatic negative GI ROS, Neg liver ROS,,,  Endo/Other  diabetes, Type 2    Renal/GU Renal disease     Musculoskeletal   Abdominal Normal abdominal exam  (+)   Peds  Hematology  (+) Blood dyscrasia, anemia   Anesthesia Other Findings Past Medical History: 08/24/2022: Acute osteomyelitis of left ankle or foot (HCC) 09/05/2022: AKI (acute kidney injury) (HCC) 09/03/2023: 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) 08/19/2022: Atrial fibrillation with RVR (HCC) No date: Bladder neck obstruction 08/17/2022: Cellulitis No date: Chronic kidney disease No date: Coronary artery disease     Comment:  a.) s/p 4v CABG in 2014 No date: Diabetes mellitus without complication (HCC) No date: Diabetic neuropathy (HCC) No date: Diabetic peripheral neuropathy (HCC) 12/21/2017: Diabetic ulcer of toe of left foot associated with  diabetes mellitus of other type, limited to breakdown of skin (HCC) No date: Diverticulosis No date: Gout 05/11/2023: Gram-negative bacteremia No date: Heart murmur 05/31/2015: History of osteomyelitis No date: Hypercholesteremia No date: Hyperlipidemia No date:  Hypertension 08/17/2022: Hypotension due to hypovolemia 08/19/2022: Infection of left foot 02/28/2023: MSSA bacteremia 05/10/2023: Open wound of left foot with complication 05/31/2015: Osteomyelitis (HCC) No date: Peripheral neuropathy 02/26/2023: Postural dizziness with presyncope 06/09/2023: S/P BKA (below knee amputation) unilateral, left (HCC) 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 08/29/2022: S/P transmetatarsal amputation of foot, left (HCC) 08/17/2022: Sepsis (HCC) 05/10/2023: Sepsis (HCC) 11/01/2015: Status post amputation of toe of right foot (HCC) No date: Tubular adenoma No date: Vitamin D  deficiency  Past Surgical History: 09/14/2023: ACHILLES TENDON SURGERY; Right     Comment:  Procedure: TENOTOMY, ACHILLES;  Surgeon: [name],              DPM;  Location: [facility];  Service: Orthopedics/Podiatry;              Laterality: Right; 08/19/2022: AMPUTATION; Left     Comment:  Procedure: TRANSMETATARSAL AMPUTATION LEFT FOOT WITH               IRRIGATION AND DEBRIDEMENT;  Surgeon: Lennie Barter, DPM;              Location: [facility];  Service: Podiatry;  Laterality:               Left; 05/16/2023: AMPUTATION; Left     Comment:  Procedure: AMPUTATION BELOW KNEE;  Surgeon: Marea Selinda RAMAN, MD;  Location: [facility];  Service: General;                Laterality: Left; 09/14/2023: AMPUTATION; Right     Comment:  Procedure: AMPUTATION, FOOT, RAY;  Surgeon: Ashley,  Eva, DPM;  Location: [facility];  Service:               Orthopedics/Podiatry;  Laterality: Right; 06/01/2015: AMPUTATION TOE; Right     Comment:  Procedure: AMPUTATION TOE;  Surgeon: Donnice Cory,               DPM;  Location: [facility];  Service: Podiatry;                Laterality: Right; 08/11/2022: AMPUTATION TOE; Left     Comment:  Procedure: AMPUTATION TOE 2, 3, 4;  Surgeon: Ashley Eva, DPM;  Location: [facility];  Service: Podiatry;                Laterality: Left; No date:  CATARACT EXTRACTION, BILATERAL 06/12/2022: CIRCUMCISION; N/A     Comment:  Procedure: CIRCUMCISION ADULT;  Surgeon: Penne Knee, MD;  Location: [facility];  Service: Urology;                Laterality: N/A; 02/14/2016: COLONOSCOPY WITH PROPOFOL ; N/A     Comment:  Procedure: COLONOSCOPY WITH PROPOFOL ;  Surgeon: Gladis RAYMOND Mariner, MD;  Location: [facility];  Service:               Endoscopy;  Laterality: N/A; 01/07/2019: COLONOSCOPY WITH PROPOFOL ; N/A     Comment:  Procedure: COLONOSCOPY WITH PROPOFOL ;  Surgeon:               Mariner Gladis RAYMOND, MD;  Location: [facility];                Service: Endoscopy;  Laterality: N/A; 08/2012: CORONARY ARTERY BYPASS GRAFT; N/A 06/01/2015: EXCISION PARTIAL PHALANX; Right     Comment:  Procedure: EXCISION PARTIAL PHALANX /  BONE;  Surgeon:               Donnice Cory, DPM;  Location: [facility];  Service:               Podiatry;  Laterality: Right; 05/29/2016: FLEXIBLE SIGMOIDOSCOPY; N/A     Comment:  Procedure: FLEXIBLE SIGMOIDOSCOPY;  Surgeon: Gladis RAYMOND Mariner, MD;  Location: [facility];  Service:               Endoscopy;  Laterality: N/A; 08/23/2022: INCISION AND DRAINAGE; Left     Comment:  Procedure: INCISION AND DRAINAGE;  Surgeon: Ashley Eva, DPM;  Location: [facility];  Service: Podiatry;                Laterality: Left; 09/15/2022: INCISION AND DRAINAGE OF WOUND; Left     Comment:  Procedure: 11044 - DEBRIDE BONE and EXCISION IF 1ST               METATARSAL BONE WITH  DELAY PRIMARY CLOSURE;  Surgeon:               Ashley Eva, DPM;  Location: [facility];  Service:               Podiatry;  Laterality: Left; 02/28/2023: IRRIGATION AND DEBRIDEMENT FOOT; Left     Comment:  Procedure:  IRRIGATION AND DEBRIDEMENT FOOT;  Surgeon:               [name], DPM;  Location: [facility];  Service:               Orthopedics/Podiatry;  Laterality: Left; No date: KNEE ARTHROSCOPY;  Left 08/22/2022: LOWER EXTREMITY ANGIOGRAPHY; Left     Comment:  Procedure: Lower Extremity Angiography;  Surgeon:               Jama Cordella MATSU, MD;  Location: [facility];               Service: Cardiovascular;  Laterality: Left; 05/11/2023: LOWER EXTREMITY ANGIOGRAPHY; Left     Comment:  Procedure: Lower Extremity Angiography;  Surgeon: Marea Selinda RAMAN, MD;  Location: [facility];  Service:               Cardiovascular;  Laterality: Left; 09/11/2023: LOWER EXTREMITY ANGIOGRAPHY; Right     Comment:  Procedure: Lower Extremity Angiography;  Surgeon:               Jama Cordella MATSU, MD;  Location: [facility];               Service: Cardiovascular;  Laterality: Right; 09/11/2023: LOWER EXTREMITY INTERVENTION; Right     Comment:  Procedure: LOWER EXTREMITY INTERVENTION;  Surgeon:               Jama Cordella MATSU, MD;  Location: [facility];               Service: Cardiovascular;  Laterality: Right; 03/01/2023: TEE WITHOUT CARDIOVERSION; N/A     Comment:  Procedure: TRANSESOPHAGEAL ECHOCARDIOGRAM;  Surgeon:               Wilburn Keller BROCKS, MD;  Location: [facility];  Service:               Cardiovascular;  Laterality: N/A; 09/15/2022: WOUND DEBRIDEMENT; Left     Comment:  Procedure: 11043 - DEBRIDE SKIN. MUSCLE FASCIA;                Surgeon: [name], DPM;  Location: [facility];                Service: Podiatry;  Laterality: Left;  BMI    Body Mass Index: 23.50 kg/m      Reproductive/Obstetrics negative OB ROS                              Anesthesia Physical Anesthesia Plan  ASA: 3  Anesthesia Plan: General   Post-op Pain Management:    Induction: Intravenous  PONV Risk Score and Plan: Ondansetron , Dexamethasone , Treatment may vary due to age or medical condition, TIVA and Propofol  infusion  Airway Management Planned: Natural Airway and Simple Face Mask  Additional Equipment:   Intra-op Plan:    Post-operative Plan:   Informed Consent: I have reviewed the patients History and Physical, chart, labs and discussed the procedure including the risks, benefits and alternatives for the proposed anesthesia with the patient or authorized representative who has indicated his/her understanding and acceptance.     Dental Advisory Given  Plan Discussed with: Anesthesiologist, CRNA and Surgeon  Anesthesia Plan Comments:          Anesthesia Quick Evaluation

## 2024-01-11 NOTE — Progress Notes (Signed)
 Pt has returned to the unit, s/p surgical irrigation & debridement of the R foot (4th & 5th metatarsals) Swab for wound culture was taken. Pt denies pain/discomfort at this time.  Per report, procedure was successful, no wound care orders at this time.  Pt will f/u w/ outpatient podiatry/vascular appt in 1 week. Pt is stable at this time.

## 2024-01-12 ENCOUNTER — Encounter: Payer: Self-pay | Admitting: Podiatry

## 2024-01-12 DIAGNOSIS — L089 Local infection of the skin and subcutaneous tissue, unspecified: Secondary | ICD-10-CM | POA: Diagnosis not present

## 2024-01-12 LAB — PROTIME-INR
INR: 1.2 (ref 0.8–1.2)
Prothrombin Time: 16 s — ABNORMAL HIGH (ref 11.4–15.2)

## 2024-01-12 LAB — GLUCOSE, CAPILLARY
Glucose-Capillary: 139 mg/dL — ABNORMAL HIGH (ref 70–99)
Glucose-Capillary: 128 mg/dL — ABNORMAL HIGH (ref 70–99)
Glucose-Capillary: 199 mg/dL — ABNORMAL HIGH (ref 70–99)
Glucose-Capillary: 132 mg/dL — ABNORMAL HIGH (ref 70–99)

## 2024-01-12 LAB — CBC
HCT: 32.6 % — ABNORMAL LOW (ref 39.0–52.0)
Hemoglobin: 10.5 g/dL — ABNORMAL LOW (ref 13.0–17.0)
MCH: 28.5 pg (ref 26.0–34.0)
MCHC: 32.2 g/dL (ref 30.0–36.0)
MCV: 88.3 fL (ref 80.0–100.0)
Platelets: 163 K/uL (ref 150–400)
RBC: 3.69 MIL/uL — ABNORMAL LOW (ref 4.22–5.81)
RDW: 13.7 % (ref 11.5–15.5)
WBC: 8.9 K/uL (ref 4.0–10.5)
nRBC: 0 % (ref 0.0–0.2)

## 2024-01-12 LAB — BASIC METABOLIC PANEL WITH GFR
Anion gap: 7 (ref 5–15)
BUN: 13 mg/dL (ref 8–23)
CO2: 24 mmol/L (ref 22–32)
Calcium: 8.5 mg/dL — ABNORMAL LOW (ref 8.9–10.3)
Chloride: 108 mmol/L (ref 98–111)
Creatinine, Ser: 1.24 mg/dL (ref 0.61–1.24)
GFR, Estimated: >60 mL/min (ref >60)
Glucose, Bld: 129 mg/dL — ABNORMAL HIGH (ref 70–99)
Potassium: 3.6 mmol/L (ref 3.5–5.1)
Sodium: 139 mmol/L (ref 135–145)

## 2024-01-12 LAB — APTT: aPTT: 42 s — ABNORMAL HIGH (ref 24–36)

## 2024-01-12 LAB — HEPARIN LEVEL (UNFRACTIONATED): Heparin Unfractionated: 0.27 [IU]/mL — ABNORMAL LOW (ref 0.30–0.70)

## 2024-01-12 MED ORDER — TRAZODONE HCL 50 MG PO TABS
50.0000 mg | ORAL_TABLET | Freq: Every day | ORAL | Status: DC
  Administered 2024-01-12 – 2024-01-15 (×3): 50 mg via ORAL
  Filled 2024-01-12 (×4): qty 1

## 2024-01-12 MED ORDER — GABAPENTIN 300 MG PO CAPS
300.0000 mg | ORAL_CAPSULE | Freq: Three times a day (TID) | ORAL | Status: DC
  Administered 2024-01-12 – 2024-01-16 (×11): 300 mg via ORAL
  Filled 2024-01-12 (×12): qty 1

## 2024-01-12 MED ORDER — INSULIN GLARGINE 100 UNIT/ML ~~LOC~~ SOLN
28.0000 [IU] | Freq: Every day | SUBCUTANEOUS | Status: AC
Start: 2024-01-12 — End: ?
  Administered 2024-01-12 – 2024-01-16 (×4): 28 [IU] via SUBCUTANEOUS
  Filled 2024-01-12 (×5): qty 0.28

## 2024-01-12 MED ORDER — OXYCODONE HCL 5 MG PO TABS: 5.0000 mg | ORAL_TABLET | Freq: Four times a day (QID) | ORAL | Status: DC | PRN

## 2024-01-12 MED ORDER — HEPARIN BOLUS VIA INFUSION
4500.0000 [IU] | Freq: Once | INTRAVENOUS | Status: AC
  Administered 2024-01-12: 4500 [IU] via INTRAVENOUS
  Filled 2024-01-12: qty 4500

## 2024-01-12 MED ORDER — VITAMIN B-12 1000 MCG PO TABS
1000.0000 ug | ORAL_TABLET | Freq: Every day | ORAL | Status: DC
  Administered 2024-01-12 – 2024-01-16 (×5): 1000 ug via ORAL
  Filled 2024-01-12 (×5): qty 1

## 2024-01-12 MED ORDER — CLOPIDOGREL BISULFATE 75 MG PO TABS
75.0000 mg | ORAL_TABLET | Freq: Every day | ORAL | Status: DC
Start: 2024-01-12 — End: 2024-01-13
  Administered 2024-01-12 – 2024-01-13 (×2): 75 mg via ORAL
  Filled 2024-01-12 (×2): qty 1

## 2024-01-12 MED ORDER — DULAGLUTIDE 0.75 MG/0.5ML ~~LOC~~ SOAJ: 0.7500 mg | SUBCUTANEOUS | Status: DC

## 2024-01-12 MED ORDER — METFORMIN HCL ER 500 MG PO TB24
1000.0000 mg | ORAL_TABLET | Freq: Two times a day (BID) | ORAL | Status: DC
Start: 2024-01-12 — End: 2024-01-12
  Filled 2024-01-12: qty 2

## 2024-01-12 MED ORDER — SODIUM CHLORIDE 0.9 % IV SOLN
2.0000 g | Freq: Three times a day (TID) | INTRAVENOUS | Status: AC
  Administered 2024-01-12 – 2024-01-15 (×11): 2 g via INTRAVENOUS
  Filled 2024-01-12 (×11): qty 12.5

## 2024-01-12 MED ORDER — METOPROLOL TARTRATE 25 MG PO TABS
12.5000 mg | ORAL_TABLET | Freq: Two times a day (BID) | ORAL | Status: DC
  Administered 2024-01-12 – 2024-01-15 (×8): 12.5 mg via ORAL
  Filled 2024-01-12 (×9): qty 1

## 2024-01-12 MED ORDER — DOCUSATE SODIUM 100 MG PO CAPS
100.0000 mg | ORAL_CAPSULE | Freq: Two times a day (BID) | ORAL | Status: DC
  Administered 2024-01-12 – 2024-01-16 (×7): 100 mg via ORAL
  Filled 2024-01-12 (×10): qty 1

## 2024-01-12 MED ORDER — PRAVASTATIN SODIUM 20 MG PO TABS
80.0000 mg | ORAL_TABLET | Freq: Every day | ORAL | Status: DC
  Administered 2024-01-12 – 2024-01-15 (×4): 80 mg via ORAL
  Filled 2024-01-12 (×4): qty 4

## 2024-01-12 MED ORDER — HEPARIN (PORCINE) 25000 UT/250ML-% IV SOLN
1500.0000 [IU]/h | INTRAVENOUS | Status: DC
  Administered 2024-01-12 – 2024-01-13 (×3): 1200 [IU]/h via INTRAVENOUS
  Administered 2024-01-14: 1350 [IU]/h via INTRAVENOUS
  Administered 2024-01-15: 1500 [IU]/h via INTRAVENOUS
  Filled 2024-01-12 (×4): qty 250

## 2024-01-12 NOTE — Evaluation (Signed)
 Occupational Therapy Evaluation Patient Details Name: Andrew Harding MRN: [id number] DOB: 12-09-1950 Today's Date: 01/12/2024   History of Present Illness   Per chart review, Andrew Harding is a 72 y.o. male with medical history significant for  DM II, peripheral neuropathy,CAD, s/p CABG, HTN, Atrial fibrillation with eliquis  for stroke prophylaxis, s/p right great toe amputation.  PAD s/p stent to rt ant tibial artery, s/p percutaneous angioplasty of the right mid popliteal artery and the right tibioperoneal trunk followed by TMA on the right on 11/02/23, and left BKA. He was admitted with worsening pain and infection in right foot. s/p Excision of 4-5th right metatarsal on 01/11/2024     Clinical Impressions Andrew Harding presents to [facility] from [facility], where he has been staying since July 2025, following TMA on R LE. Prior to his foot surgery, pt lived alone in a 1 story home, 5 STE. Per podiatrist order, it is of utmost importance for pt to weight bear in heel of R foot only, while wearing post-op shoe, and to keep WBing to an absolute minimum, to avoid further complications to R LE. During today's evaluation, pt finds it difficult to perform transfers, toileting, or any OOB movements without putting undo pressure on R LE. Therefore, believe it would be unsafe for pt to return home alone. He does have a WC at home, but neither his bedroom nor bathroom are WC-accessible. Pt is able to don and doff post-op shoe INDly; however, is unable to consistently maintain heel-only pressure on his R LE.    If plan is discharge home, recommend the following:   A little help with walking and/or transfers;A little help with bathing/dressing/bathroom;Help with stairs or ramp for entrance     Functional Status Assessment   Patient has had a recent decline in their functional status and demonstrates the ability to make significant improvements in function in a reasonable and predictable amount of time.     Equipment  Recommendations   None recommended by OT     Recommendations for Other Services         Precautions/Restrictions   Precautions Precautions: Fall Recall of Precautions/Restrictions: Intact Precaution/Restrictions Comments: no recent falls Restrictions Weight Bearing Restrictions Per Provider Order: Yes Other Position/Activity Restrictions: Per podiatrist, min WB in heel of RLE PRN to maintain balance with transfers, w/ WBing kept to an absolute minimum     Mobility Bed Mobility Overal bed mobility: Independent                  Transfers Overall transfer level: Needs assistance Equipment used: Rolling walker (2 wheels) Transfers: Bed to chair/wheelchair/BSC, Sit to/from Stand Sit to Stand: Supervision Stand pivot transfers: Supervision                Balance Overall balance assessment: Needs assistance Sitting-balance support: Feet supported Sitting balance-Leahy Scale: Good     Standing balance support: Bilateral upper extremity supported, Reliant on assistive device for balance Standing balance-Leahy Scale: Fair                             ADL either performed or assessed with clinical judgement   ADL Overall ADL's : Needs assistance/impaired                     Lower Body Dressing: Supervision/safety Lower Body Dressing Details (indicate cue type and reason): Pt able to don/doff post-surgical boot Toilet Transfer: Radiographer, therapeutic  Details (indicate cue type and reason): Cueing re: WBing precautions                 Vision         Perception         Praxis         Pertinent Vitals/Pain Pain Assessment Pain Assessment: 0-10 Pain Score: 3  Pain Location: right foot Pain Descriptors / Indicators: Shooting Pain Intervention(s): Repositioned, Limited activity within patient's tolerance     Extremity/Trunk Assessment Upper Extremity Assessment Upper Extremity Assessment: Overall WFL for  tasks assessed   Lower Extremity Assessment Lower Extremity Assessment: LLE deficits/detail;RLE deficits/detail RLE Deficits / Details: 4/5 hip/knee, ankle not tested as recent surgery LLE Deficits / Details: 5/5 for hip/knee   Cervical / Trunk Assessment Cervical / Trunk Assessment: Normal   Communication Communication Communication: No apparent difficulties   Cognition Arousal: Alert Behavior During Therapy: WFL for tasks assessed/performed Cognition: No apparent impairments                               Following commands: Intact       Cueing  General Comments          Exercises Other Exercises Other Exercises: Educ re: PoC, DC recs, WBing precautions   Shoulder Instructions      Home Living Family/patient expects to be discharged to:: Private residence Living Arrangements: Alone Available Help at Discharge: Family;Friend(s);Available PRN/intermittently Type of Home: House Home Access: Stairs to enter [employer] of Steps: 5 Entrance Stairs-Rails: Right Home Layout: One level     Bathroom Shower/Tub: Chief Strategy Officer: Handicapped height Bathroom Accessibility: Yes   Home Equipment: Agricultural consultant (2 wheels);Cane - single point;Wheelchair - manual;Grab bars - tub/shower;Tub bench          Prior Functioning/Environment Prior Level of Function : Independent/Modified Independent;Driving             Mobility Comments: has L LE prosthetic, ambulates w/o AD ADLs Comments: Pt was at [facility] prior to recent hospital admission after recent infection in right foot; He has wheelchair at home, but states it will not fit in bathroom or bedroom    OT Problem List: Impaired balance (sitting and/or standing);Decreased activity tolerance;Decreased range of motion;Decreased knowledge of use of DME or AE   OT Treatment/Interventions:        OT Goals(Current goals can be found in the care plan section)   Acute Rehab  OT Goals Patient Stated Goal: to go home OT Goal Formulation: With patient Time For Goal Achievement: 01/26/24 Potential to Achieve Goals: Good ADL Goals Pt Will Perform Lower Body Dressing: sitting/lateral leans;with modified independence (including donning/doffing post-op shoe with 100% accuracy at every appropriate moment (no WBing w/o post-op shoe on).) Pt Will Transfer to Toilet: with modified independence;ambulating (while adhereing to [facility] precautions with 100% accuracy) Additional ADL Goal #1: Pt will clearly describe and demonstrate WBing precautions   OT Frequency:  Min 2X/week    Co-evaluation              AM-PAC OT 6 Clicks Daily Activity     Outcome Measure Help from another person eating meals?: None Help from another person taking care of personal grooming?: A Little Help from another person toileting, which includes using toliet, bedpan, or urinal?: A Little Help from another person bathing (including washing, rinsing, drying)?: A Lot Help from another person to put on and  taking off regular upper body clothing?: None Help from another person to put on and taking off regular lower body clothing?: A Little 6 Click Score: 19   End of Session Equipment Utilized During Treatment: Rolling walker (2 wheels)  Activity Tolerance: Patient tolerated treatment well Patient left: in chair;with call bell/phone within reach  OT Visit Diagnosis: Unsteadiness on feet (R26.81);Other abnormalities of gait and mobility (R26.89);Muscle weakness (generalized) (M62.81)                Time: 9069-9056 OT Time Calculation (min): 13 min Charges:  OT General Charges $OT Visit: 1 Visit OT Evaluation $OT Eval Low Complexity: 1 Low Suzen Hock, PhD, MS, OTR/L 01/12/24, 1:57 PM

## 2024-01-12 NOTE — Progress Notes (Signed)
      Daily Progress Note   Assessment/Planning:   POD #1 s/p R 4th and 4th MT excision  ABI reviewed demonstrate medial calcification with waveforms suggestive of mild-mod PAD Defer to Dr. Dreama possible repeat RLE angiogram on Tuesday Nothing planned over the weekend   Subjective  - 1 Day Post-Op   Pain tolerable   Objective   Vitals:   01/11/24 1940 01/12/24 0015 01/12/24 0428 01/12/24 0739  BP: 116/69 110/69 116/67 111/75  Pulse: 90 (!) 108 86 80  Resp:    18  Temp: 99.4 F (37.4 C) 99.6 F (37.6 C) 99.1 F (37.3 C) 98.5 F (36.9 C)  TempSrc: Oral Oral Oral   SpO2: 99% 99% 99% 99%  Weight:      Height:         Intake/Output Summary (Last 24 hours) at 01/12/2024 0837 Last data filed at 01/11/2024 1538 Gross per 24 hour  Intake 258.63 ml  Output 5 ml  Net 253.63 ml    VASC R foot bandaged    Laboratory   CBC    Latest Ref Rng & Units 01/12/2024    4:20 AM 01/11/2024    5:07 AM 01/10/2024   12:18 PM  CBC  WBC 4.0 - 10.5 K/uL 8.9  7.3  6.4   Hemoglobin 13.0 - 17.0 g/dL 89.4  89.4  88.3   Hematocrit 39.0 - 52.0 % 32.6  33.3  36.5   Platelets 150 - 400 K/uL 163  165  193     BMET    Component Value Date/Time   NA 139 01/12/2024 0420   K 3.6 01/12/2024 0420   CL 108 01/12/2024 0420   CO2 24 01/12/2024 0420   GLUCOSE 129 (H) 01/12/2024 0420   GLUCOSE 402 (H) 09/17/2012 1417   BUN 13 01/12/2024 0420   CREATININE 1.24 01/12/2024 0420   CALCIUM 8.5 (L) 01/12/2024 0420   GFRNONAA >60 01/12/2024 0420   GFRAA 58 (L) 06/02/2015 0656     Redell Door, MD, FACS, FSVS Covering for [facility]: [phone]  01/12/2024, 8:37 AM

## 2024-01-12 NOTE — Progress Notes (Signed)
 PROGRESS NOTE    Andrew Harding  FMW:[id number] DOB: 10/19/1950 DOA: 01/10/2024 PCP: Fernande Ophelia [name], MD     Brief Narrative:   From admission h and p  Andrew Harding Feeling is a 73 y.o. male with medical history significant for  DM II, peripheral neuropathy,CAD, s/p CABG, HTN, Atrial fibrillation with eliquis  for stroke prophylaxis, s/p right great toe amputation.  PAD s/p stent to rt ant tibial artery, s/p percutaneous angioplasty of the right mid popliteal artery and the right tibioperoneal trunk followed by TMA on the right on 11/02/23, and left BKA. Pathology from TMA was positive for osteomyelitis but margin was clear of osteo. Cultures from surgery grew out MRSA and enterococcus fecalis. He received treatment with 4 weeks of IV daptomycin  which was extended by two more weeks to complete 6 weeks of therapy on 12/14/2023.  The patient presented to podiatrists office on the morning of 01/10/1024 with complaints of worsening infection to his right foot. He was found to have drainage to the distal lateral aspect of the right foot from the most recent transmetatarsal amputation. MDRO enterobacter has been grown from this drainage. Susceptibilities demonstrated sensitivity to amikacin, aztreonam, cefepime , imipenem and meropenem . There was frank purulence from the wound today.    PICC line was placed prior to presentation in ED in anticipation of another course of outpatient antibiotics. He was sent to the ED for admission. I & D by podiatry has been scheduled for 01/11/2024.   The patient denies pain (he has severe neuropathy in that limb), fevers, chills, nausea, vomiting, diarrhea, constipation, cough, chest pain, neurological deficits, confusion, headaches.  Assessment & Plan:   Principal Problem:   Foot infection Active Problems:   Atrial fibrillation (HCC)   Benign essential hypertension   CKD stage 3a, GFR 45-59 ml/min (HCC)   CAD S/P CABG x 4   Controlled type 2 diabetes mellitus with neuropathy  (HCC)   S/P BKA (below knee amputation) unilateral, left (HCC)   PAD (peripheral artery disease) (HCC)   # Right foot osteomyelitis Complicated recent infectious course, see ID note for details. TMA performed 7/4, revision 8/22. 9/4 culture growing e faecalis, mdr. S/p excision of bone right 4th and 5th metatarsals 9/12 for ongoing osteo there with podiatry. Intra-op culture obtained, growing GNRs - follow cultures - abx per ID currently on vanc/cefepime  - wound care per podiatry - pt/ot   # Debility PT advising return to snf - [facility] consulted  # PAD S/p thrombectomy, angioplasty, and stent placement right leg on 7/7 with dr. Marea. Vascular has been consulted to ensure patient is optimized. ABI shows signs mild/mod PAD - weekday team to determine if angiogram needed - home plavix  on hold - home statin - heparin  for home apixaban   # Chronic pain - home gabapentin   # CAD S/p cabg x4, asymptomatic - home metoprolol , statin - heparin   # HTN Bp well controlled - home metop  # A-fib, paroxysmal Rate controlled - home BB - per podiatry likely ok to resume anticoagulation today. Given need for possible procedure will start heparin   # T2DM Euglycemic - SSI  DVT prophylaxis: heparin  Code Status: full Family Communication: friend updated @ bedside 9/12  Level of care: Telemetry Surgical Status is: Inpatient Remains inpatient appropriate because: ongoing inpatinet evaluation and treatment    Consultants:  ID, podiatry  Procedures: See above  Antimicrobials:  See above    Subjective: No complaints  Objective: Vitals:   01/12/24 0015 01/12/24 0428 01/12/24 9260  01/12/24 1206  BP: 110/69 116/67 111/75 125/67  Pulse: (!) 108 86 80 82  Resp:   18 18  Temp: 99.6 F (37.6 C) 99.1 F (37.3 C) 98.5 F (36.9 C) 98 F (36.7 C)  TempSrc: Oral Oral    SpO2: 99% 99% 99% 98%  Weight:      Height:        Intake/Output Summary (Last 24 hours) at 01/12/2024  1344 Last data filed at 01/12/2024 0900 Gross per 24 hour  Intake 58.63 ml  Output --  Net 58.63 ml   Filed Weights   01/10/24 1112  Weight: 82.6 kg    Examination:  General exam: Appears calm and comfortable  Respiratory system: Clear to auscultation. Respiratory effort normal. Cardiovascular system: S1 & S2 heard, RR  Gastrointestinal system: Abdomen is nondistended, soft and nontender.   Central nervous system: Alert and oriented. No focal neurological deficits. Extremities: Symmetric 5 x 5 power. Bandage over right foot. S/p left BKA Skin: bandage right foot Psychiatry: Judgement and insight appear normal. Mood & affect appropriate.     Data Reviewed: I have personally reviewed following labs and imaging studies  CBC: Recent Labs  Lab 01/10/24 1218 01/11/24 0507 01/12/24 0420  WBC 6.4 7.3 8.9  NEUTROABS 4.5  --   --   HGB 11.6* 10.5* 10.5*  HCT 36.5* 33.3* 32.6*  MCV 89.7 89.3 88.3  PLT 193 165 163   Basic Metabolic Panel: Recent Labs  Lab 01/10/24 1218 01/10/24 1247 01/11/24 0507 01/12/24 0420  NA 140  --  141 139  K 4.1  --  3.7 3.6  CL 107  --  107 108  CO2 23  --  23 24  GLUCOSE 147*  --  165* 129*  BUN 12  --  13 13  CREATININE 1.35*  --  1.31* 1.24  CALCIUM 8.9  --  8.5* 8.5*  MG  --  2.0  --   --    GFR: Estimated Creatinine Clearance: 61.1 mL/min (by C-G formula based on SCr of 1.24 mg/dL). Liver Function Tests: No results for input(s): AST, ALT, ALKPHOS, BILITOT, PROT, ALBUMIN in the last 168 hours. No results for input(s): LIPASE, AMYLASE in the last 168 hours. No results for input(s): AMMONIA in the last 168 hours. Coagulation Profile: No results for input(s): INR, PROTIME in the last 168 hours. Cardiac Enzymes: No results for input(s): CKTOTAL, CKMB, CKMBINDEX, TROPONINI in the last 168 hours. BNP (last 3 results) No results for input(s): PROBNP in the last 8760 hours. HbA1C: No results for input(s):  HGBA1C in the last 72 hours. CBG: Recent Labs  Lab 01/11/24 1216 01/11/24 1603 01/11/24 2030 01/12/24 0741 01/12/24 1207  GLUCAP 128* 175* 95 139* 128*   Lipid Profile: No results for input(s): CHOL, HDL, LDLCALC, TRIG, CHOLHDL, LDLDIRECT in the last 72 hours. Thyroid Function Tests: No results for input(s): TSH, T4TOTAL, FREET4, T3FREE, THYROIDAB in the last 72 hours. Anemia Panel: No results for input(s): VITAMINB12, FOLATE, FERRITIN, TIBC, IRON , RETICCTPCT in the last 72 hours. Urine analysis:    Component Value Date/Time   COLORURINE YELLOW (A) 05/10/2023 0937   APPEARANCEUR HAZY (A) 05/10/2023 0937   APPEARANCEUR Hazy (A) 05/30/2022 1026   LABSPEC 1.017 05/10/2023 0937   PHURINE 5.0 05/10/2023 0937   GLUCOSEU 50 (A) 05/10/2023 0937   HGBUR MODERATE (A) 05/10/2023 0937   BILIRUBINUR NEGATIVE 05/10/2023 0937   BILIRUBINUR Negative 05/30/2022 1026   KETONESUR NEGATIVE 05/10/2023 0937   PROTEINUR 100 (A)  05/10/2023 0937   UROBILINOGEN 0.2 04/05/2018 1042   NITRITE NEGATIVE 05/10/2023 0937   LEUKOCYTESUR NEGATIVE 05/10/2023 0937   Sepsis Labs: @LABRCNTIP (procalcitonin:4,lacticidven:4)  ) Recent Results (from the past 240 hours)  Culture, blood (Routine X 2) w Reflex to ID Panel     Status: None (Preliminary result)   Collection Time: 01/10/24 12:33 PM   Specimen: BLOOD  Result Value Ref Range Status   Specimen Description BLOOD RIGHT ANTECUBITAL  Final   Special Requests   Final    BOTTLES DRAWN AEROBIC AND ANAEROBIC Blood Culture results may not be optimal due to an inadequate volume of blood received in culture bottles   Culture   Final    NO GROWTH 2 DAYS Performed at [facility], [address] [place], [place] [address]    Report Status PENDING  Incomplete  Blood culture (single)     Status: None (Preliminary result)   Collection Time: 01/10/24  1:28 PM   Specimen: BLOOD  Result Value Ref Range Status    Specimen Description BLOOD BLOOD LEFT HAND  Final   Special Requests   Final    BOTTLES DRAWN AEROBIC AND ANAEROBIC Blood Culture adequate volume   Culture   Final    NO GROWTH 2 DAYS Performed at [facility], [address], [place], [place] [address]    Report Status PENDING  Incomplete  Aerobic/Anaerobic Culture w Gram Stain (surgical/deep wound)     Status: None (Preliminary result)   Collection Time: 01/11/24 11:12 AM   Specimen: Foot, Right; Tissue  Result Value Ref Range Status   Specimen Description   Final    WOUND Performed at [facility], [address], [place], [place] [address]    Special Requests   Final    RIGHT FOOT TISSUE SWAB Performed at [facility], [address] [place], [place] [address]    Gram Stain NO WBC SEEN RARE GRAM NEGATIVE RODS   Final   Culture   Final    FEW GRAM NEGATIVE RODS IDENTIFICATION TO FOLLOW Performed at [facility], [address] [place], [place] [address]    Report Status PENDING  Incomplete  Aerobic/Anaerobic Culture w Gram Stain (surgical/deep wound)     Status: None (Preliminary result)   Collection Time: 01/11/24 11:12 AM   Specimen: Foot, Right; Blood  Result Value Ref Range Status   Specimen Description   Final    WOUND Performed at [facility], [address] [place], [place] [address]    Special Requests   Final    BONE CULTURE RIGHT FOOT CUP Performed at [facility], [address] [place], [place] [address]    Gram Stain   Final    RARE WBC PRESENT, PREDOMINANTLY PMN NO ORGANISMS SEEN    Culture   Final    RARE GRAM NEGATIVE RODS IDENTIFICATION TO FOLLOW Performed at [facility], [address] [place], [place] [address]    Report Status PENDING  Incomplete         Radiology Studies: US  ARTERIAL ABI (SCREENING LOWER EXTREMITY) Result Date: 01/12/2024 CLINICAL DATA:  Delayed wound healing EXAM: NONINVASIVE PHYSIOLOGIC VASCULAR  STUDY OF BILATERAL LOWER EXTREMITIES TECHNIQUE: Evaluation of both lower extremities were performed at rest, including calculation of ankle-brachial indices with single level Doppler, pressure and pulse volume recording. COMPARISON:  None Available. FINDINGS: Right ABI:  1.89 Right Lower Extremity: Abnormal biphasic arterial waveforms at the ankle. > 1.4 Non diagnostic  secondary to incompressible vessel calcifications (medial arterial sclerosis of [place]) IMPRESSION: Nondiagnostic right ankle-brachial index due to non compressibility of the arteries at the ankle. This is often seen in the setting of chronic medial arteriosclerosis. However, the arterial waveforms are abnormal in monophasic indicating potentially occlusive proximal peripheral arterial disease. If further imaging is clinically warranted, CTA of the aorta with lower extremity runoff could further evaluate. Electronically Signed   By: Wilkie Lent M.D.   On: 01/12/2024 08:02   US  EKG SITE RITE Result Date: 01/11/2024 If Site Rite image not attached, placement could not be confirmed due to current cardiac rhythm.  DG MINI C-ARM IMAGE ONLY Result Date: 01/11/2024 There is no interpretation for this exam.  This order is for images obtained during a surgical procedure.  Please See Surgeries Tab for more information regarding the procedure.        Scheduled Meds:  insulin  aspart  0-9 Units Subcutaneous TID WC   leptospermum manuka honey  1 Application Topical Daily   leptospermum manuka honey  1 Application Topical Daily   pneumococcal 20-valent conjugate vaccine  0.5 mL Intramuscular Tomorrow-1000   Continuous Infusions:  ceFEPime  (MAXIPIME ) IV     vancomycin  750 mg (01/12/24 1236)     LOS: 2 days     Devaughn KATHEE Ban, MD [facility]   If 7PM-7AM, please contact night-coverage [email or web address] Password [email or web address] 01/12/2024, 1:44 PM

## 2024-01-12 NOTE — Progress Notes (Signed)
 PODIATRY: PROGRESS NOTE    Surgery:  Procedure(s) (LRB): IRRIGATION AND DEBRIDEMENT FOOT (Right) POD:  1 Day Post-Op  O/N: NAEON  Subjective:  Patient resting comfortably at bedside.  Denies F/C/N/V/SOB/CP. Denies acute calf pain.    PHYSICAL EXAMINATION: BP 125/67 (BP Location: Left Arm)   Pulse 82   Temp 98 F (36.7 C)   Resp 18   Ht 6' 3 (1.905 m)   Wt 82.6 kg   SpO2 98%   BMI 22.75 kg/m ? GEN: NAD. AOX3. ? RESP: Non-labored breathing on RA.? ABD: NT/ND of all four quadrants.? NEURO: Moving all four extremities spontaneously. ? ? FOCUSED LOWER EXTREMITY EXAMINATION:? NEURO: ? - LOPS - diminished gross touch  - No paresthesias elicited on examination. ??  VASCULAR: ? - DP/PT - diminished  - Focal edema noted adjacent to surgical site ; as expected for post-operative state. ??  MSK: ? - s/p L BKA  - S/p R TMA  - TTP none? - No calf tenderness RLE ?  DERM: ? - Dressings - with sanguinous strikethrough - Surgical site well coapted. ?medially and centrally - the lateral aspect of incision well coapted with maceration noted to adjacent skin bridge without frank dehiscence. no purulent / active drainage. ? - No proximal streaking. No malodor. ? - No clinical signs of infection noted. ??         Outpatient culture shows Enterobacter cloacae multidrug-resistant.  Susceptible to amikacin, aztreonam, cefepime , imipenem, and meropene   Results for orders placed or performed during the hospital encounter of 01/10/24  Culture, blood (Routine X 2) w Reflex to ID Panel     Status: None (Preliminary result)   Collection Time: 01/10/24 12:33 PM   Specimen: BLOOD  Result Value Ref Range Status   Specimen Description BLOOD RIGHT ANTECUBITAL  Final   Special Requests   Final    BOTTLES DRAWN AEROBIC AND ANAEROBIC Blood Culture results may not be optimal due to an inadequate volume of blood received in culture bottles   Culture   Final    NO GROWTH 2 DAYS Performed  at [facility], [address] [place], [place] [address]    Report Status PENDING  Incomplete  Blood culture (single)     Status: None (Preliminary result)   Collection Time: 01/10/24  1:28 PM   Specimen: BLOOD  Result Value Ref Range Status   Specimen Description BLOOD BLOOD LEFT HAND  Final   Special Requests   Final    BOTTLES DRAWN AEROBIC AND ANAEROBIC Blood Culture adequate volume   Culture   Final    NO GROWTH 2 DAYS Performed at [facility], [address], [place], [place] [address]    Report Status PENDING  Incomplete  Aerobic/Anaerobic Culture w Gram Stain (surgical/deep wound)     Status: None (Preliminary result)   Collection Time: 01/11/24 11:12 AM   Specimen: Foot, Right; Tissue  Result Value Ref Range Status   Specimen Description   Final    WOUND Performed at [facility], [address] [place], [place] [address]    Special Requests   Final    RIGHT FOOT TISSUE SWAB Performed at [facility], [address] [place], [place] [address]    Gram Stain NO WBC SEEN RARE GRAM NEGATIVE RODS   Final   Culture   Final    FEW GRAM NEGATIVE RODS IDENTIFICATION TO FOLLOW Performed at [facility], [address], [place], [place] [address]  Report Status PENDING  Incomplete  Aerobic/Anaerobic Culture w Gram Stain (surgical/deep wound)     Status: None (Preliminary result)   Collection Time: 01/11/24 11:12 AM   Specimen: Foot, Right; Blood  Result Value Ref Range Status   Specimen Description   Final    WOUND Performed at [facility], [address] [place], [place] [address]    Special Requests   Final    BONE CULTURE RIGHT FOOT CUP Performed at [facility], [address] [place], [place] [address]    Gram Stain   Final    RARE WBC PRESENT, PREDOMINANTLY PMN NO ORGANISMS SEEN    Culture   Final    RARE GRAM NEGATIVE RODS IDENTIFICATION TO FOLLOW Performed at [facility], [address], [place], [place] [address]    Report Status PENDING  Incomplete    ID Type Source Tests Collected by Time Destination  1 : right foot tissue/.bone Tissue Foot, Right SURGICAL PATHOLOGY [name], DPM 01/11/2024 1024   A : Culture, right foot Tissue Foot, Right AEROBIC/ANAEROBIC CULTURE W GRAM STAIN (SURGICAL/DEEP WOUND) [name], DPM 01/11/2024 1013   B : Bone Culture, Right foot Blood Foot, Right AEROBIC/ANAEROBIC CULTURE W VONNE STAIN (SURGICAL/DEEP WOUND) [name], DPM 01/11/2024 1017      ASSESSMENT:?  Andrew Harding is a 73 y.o. male ?s/p revision RIGHT TMA ; doing well   New diabetic foot infection with multidrug-resistant organism. Diabetes with neuropathy Peripheral vascular disease Acute osteomyelitis  PLAN:? - Activity: NWB to the RLE   - Wound Care: Podiatry to conduct post-op. Materials: Betadine  / Adaptic / Xeroform / Gauze (4x4) / Kerlix / 4 inch ACE bandage Instructions: *Podiatry to conduct post-op. Supplement Reccs: Okay to reinforce with dry gauze / loose ACE overlap prn strikethrough. Please leave underlayer intact.  - ABX: Appreciate assistance with antibiotic stewardship from medicine / pharmacy / ID services.  Anti-infectives (From admission, onward)    Start     Dose/Rate Route Frequency Ordered Stop   01/11/24 0100  vancomycin  (VANCOREADY) IVPB 750 mg/150 mL        750 mg 150 mL/hr over 60 Minutes Intravenous Every 12 hours 01/10/24 1340     01/10/24 1330  ceFEPIme  (MAXIPIME ) 2 g in sodium chloride  0.9 % 100 mL IVPB        2 g 200 mL/hr over 30 Minutes Intravenous Every 12 hours 01/10/24 1324     01/10/24 1230  piperacillin -tazobactam (ZOSYN ) IVPB 3.375 g        3.375 g 100 mL/hr over 30 Minutes Intravenous  Once 01/10/24 1218 01/10/24 1355   01/10/24 1230  vancomycin  (VANCOREADY) IVPB 2000 mg/400 mL        2,000 mg 200 mL/hr over 120 Minutes Intravenous  Once 01/10/24 1218 01/10/24 1512       - Dispo: Pending PICC  line placement - possible vascular intervention?

## 2024-01-12 NOTE — Progress Notes (Signed)
 Physical Therapy Treatment Patient Details Name: Andrew Harding MRN: [id number] DOB: 23-Dec-1950 Today's Date: 01/12/2024   History of Present Illness Per chart review, Andrew Harding is a 73 y.o. male with medical history significant for  DM II, peripheral neuropathy,CAD, s/p CABG, HTN, Atrial fibrillation with eliquis  for stroke prophylaxis, s/p right great toe amputation.  PAD s/p stent to rt ant tibial artery, s/p percutaneous angioplasty of the right mid popliteal artery and the right tibioperoneal trunk followed by TMA on the right on 11/02/23, and left BKA. He was admitted with worsening pain and infection in right foot. s/p Excision of 4-5th right metatarsal on 01/11/2024    PT Comments  PT went in room to further discuss discharge recommendations. Upon entering room, patient was resting in bed with post op shoe off and right leg flexed and foot flat on bed. PT re-educated patient on weight bearing restrictions, re-applied post op shoe and recommended patient rest in bed with right leg extended, heel resting on pillow to reduce weight/pressure to foot. While in room, patient expressed desire to use the bathroom. PT suggested BSC, however patient stated, no I want to go to the real bathroom. PT explained to patient that he should keep weight bearing to a minimum per MD order. Upon further education, he was agreeable to use bedside commode. While patient is oriented to situation, concerned he may have difficulty adhering to weight bearing precautions. He would benefit from additional education on precautions as well as how to safely transfer and increase mobility safely.     If plan is discharge home, recommend the following: A lot of help with walking and/or transfers;A lot of help with bathing/dressing/bathroom;Assistance with cooking/housework;Assist for transportation;Direct supervision/assist for medications management;Help with stairs or ramp for entrance   Can travel by private vehicle     No   Equipment Recommendations  None recommended by PT    Recommendations for Other Services       Precautions / Restrictions Precautions Precautions: Fall Recall of Precautions/Restrictions: Intact Precaution/Restrictions Comments: no recent falls Restrictions Weight Bearing Restrictions Per Provider Order: Yes RLE Weight Bearing Per Provider Order:  (min WB in RLE, use heel for transfers) Other Position/Activity Restrictions: Per podiatrist, min WB in heel of RLE PRN to maintain balance with transfers, w/ WBing kept to an absolute minimum     Mobility  Bed Mobility Overal bed mobility: Independent                  Transfers Overall transfer level: Needs assistance Equipment used: Rolling walker (2 wheels) Transfers: Bed to chair/wheelchair/BSC Sit to Stand: Contact guard assist Stand pivot transfers: Contact guard assist         General transfer comment: Required min VCs to advance RLE forward to maintain minimal weight bearing.  Patient does appear impulsive and requires re-education to slow down to maintain WB precautions. Able to maintain heel weight bearing during pivot transfer.    Ambulation/Gait               General Gait Details: N/A, per MD orders, limit weight bearing to RLE to heel weight bearing for transfers;   Stairs Stairs: Yes Stairs assistance: Min assist Stair Management: One rail Right Number of Stairs: 4 General stair comments: step to negotiation, ascending with LLE, descending with RLE; able to maintain heel WB with post op shoe; required min VCs for sequencing/positioning   Wheelchair Mobility     Tilt Bed    Modified Rankin (Stroke Patients  Only)       Balance Overall balance assessment: Needs assistance Sitting-balance support: Feet supported Sitting balance-Leahy Scale: Good     Standing balance support: Bilateral upper extremity supported, Reliant on assistive device for balance   Standing balance comment: able  to maintain minimal WB to RLE heel for pivot transfer with CGA,                            Communication Communication Communication: No apparent difficulties  Cognition Arousal: Alert Behavior During Therapy: WFL for tasks assessed/performed   PT - Cognitive impairments: No apparent impairments                       PT - Cognition Comments: oriented x4 Following commands: Intact      Cueing Cueing Techniques: Verbal cues  Exercises Other Exercises Other Exercises: Upon entering patient room, PT noticed that patient had removed post op shoe and was resting in bed with RLE bent and foot flat on the bed. PT re-emphasized WB restrictions, re-applied post op shoe and educated patient the importance of resting with right leg extended and heel resting on pillows for elevation.    General Comments        Pertinent Vitals/Pain Pain Assessment Pain Assessment: 0-10 Pain Score: 6  Pain Location: right foot Pain Descriptors / Indicators: Shooting Pain Intervention(s): Limited activity within patient's tolerance, Monitored during session, Repositioned    Home Living Family/patient expects to be discharged to:: Private residence Living Arrangements: Alone Available Help at Discharge: Family;Friend(s);Available PRN/intermittently Type of Home: House Home Access: Stairs to enter Entrance Stairs-Rails: Right Entrance Stairs-Number of Steps: 5   Home Layout: One level Home Equipment: Agricultural consultant (2 wheels);Cane - single point;Wheelchair - manual;Grab bars - tub/shower;Tub bench      Prior Function            PT Goals (current goals can now be found in the care plan section) Acute Rehab PT Goals Patient Stated Goal: to go home PT Goal Formulation: With patient Time For Goal Achievement: 01/26/24 Potential to Achieve Goals: Poor    Frequency    Min 1X/week      PT Plan      Co-evaluation              AM-PAC PT 6 Clicks Mobility    Outcome Measure  Help needed turning from your back to your side while in a flat bed without using bedrails?: None Help needed moving from lying on your back to sitting on the side of a flat bed without using bedrails?: None Help needed moving to and from a bed to a chair (including a wheelchair)?: A Little Help needed standing up from a chair using your arms (e.g., wheelchair or bedside chair)?: A Little Help needed to walk in hospital room?: A Lot Help needed climbing 3-5 steps with a railing? : A Little 6 Click Score: 19    End of Session Equipment Utilized During Treatment: Gait belt Activity Tolerance: Patient tolerated treatment well Patient left: in bed;with bed alarm set;with call bell/phone within reach Nurse Communication: Mobility status;Weight bearing status PT Visit Diagnosis: Other abnormalities of gait and mobility (R26.89)     Time: 1150-1200 PT Time Calculation (min) (ACUTE ONLY): 10 min  Charges:    $Therapeutic Activity: 8-22 mins PT General Charges $$ ACUTE PT VISIT: 1 Visit  Bradely Rudin PT, DPT 01/12/2024, 2:37 PM

## 2024-01-12 NOTE — Consult Note (Addendum)
 PHARMACY - ANTICOAGULATION CONSULT NOTE  Pharmacy Consult for heparin  Indication: atrial fibrillation  No Known Allergies  Patient Measurements: Height: 6' 3 (190.5 cm) Weight: 82.6 kg (182 lb) IBW/kg (Calculated) : 84.5 HEPARIN  DW (KG): 82.6  Vital Signs: Temp: 98 F (36.7 C) (09/13 1206) Temp Source: Oral (09/13 0428) BP: 125/67 (09/13 1206) Pulse Rate: 82 (09/13 1206)  Labs: Recent Labs    01/10/24 1218 01/11/24 0507 01/12/24 0420  HGB 11.6* 10.5* 10.5*  HCT 36.5* 33.3* 32.6*  PLT 193 165 163  CREATININE 1.35* 1.31* 1.24    Estimated Creatinine Clearance: 61.1 mL/min (by C-G formula based on SCr of 1.24 mg/dL).   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) 09/03/2023   Atrial fibrillation with RVR (HCC) 08/19/2022   Bladder neck obstruction    Cellulitis 08/17/2022   Chronic kidney disease    Coronary artery disease    a.) s/p 4v CABG in 2014   Diabetes mellitus without complication (HCC)    Diabetic neuropathy (HCC)    Diabetic peripheral neuropathy (HCC)    Diabetic ulcer of toe of left foot associated with diabetes mellitus of other type, limited to breakdown of skin (HCC) 12/21/2017   Diverticulosis    Gout    Gram-negative bacteremia 05/11/2023   Heart murmur    History of osteomyelitis 05/31/2015   Hypercholesteremia    Hyperlipidemia    Hypertension    Hypotension due to hypovolemia 08/17/2022   Infection of left foot 08/19/2022   MSSA bacteremia 02/28/2023   Open wound of left foot with complication 05/10/2023   Osteomyelitis (HCC) 05/31/2015   Peripheral neuropathy    Postural dizziness with presyncope 02/26/2023   S/P BKA (below knee amputation) unilateral, 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 Past medical history notable for T2DM, peripheral neuropathy, CAD s/p CABG, HTN, afib (on eliquis ), PAD s/p stent, percutaneous angioplasty, and TMA on right foot on 11/02/2023, and left BKA. Takes clopidogrel  and apixaban  at home. However, per med rec patient has not taken apixaban  in almost 72h. CHADSVASC score 4 (HTN, age+1, DM, CAD/PAD.) Hemoglobin at baseline consistent with patient's history. Platelets below historical baseline ~low 200s.  Baseline labs 9/13 Hgb: 10.5     Plt: 163        INR, aPTT, HL pending  Goal of Therapy:  Heparin  level 0.3-0.7 units/ml aPTT 66-102 seconds Monitor platelets by anticoagulation protocol: Yes   Plan:  Give 4500 units bolus x 1 (considering last apixaban  dose 9/10 PM) Start heparin  infusion at 1200 units/hr Check anti-Xa and aPTT in 8 hours and daily while on heparin  until confirmed correlation Continue to monitor H&H and platelets  Andrew Harding Andrew Harding 01/12/2024,2:12 PM

## 2024-01-12 NOTE — Plan of Care (Signed)
  Problem: Coping: Goal: Ability to adjust to condition or change in health will improve Outcome: Progressing   Problem: Health Behavior/Discharge Planning: Goal: Ability to manage health-related needs will improve Outcome: Progressing   Problem: Skin Integrity: Goal: Risk for impaired skin integrity will decrease Outcome: Progressing   Problem: Tissue Perfusion: Goal: Adequacy of tissue perfusion will improve Outcome: Progressing

## 2024-01-12 NOTE — Evaluation (Signed)
 Physical Therapy Evaluation Patient Details Name: Andrew Harding MRN: [id number] DOB: 06-15-1950 Today's Date: 01/12/2024  History of Present Illness  Per chart review, Andrew Harding is a 73 y.o. male with medical history significant for  DM II, peripheral neuropathy,CAD, s/p CABG, HTN, Atrial fibrillation with eliquis  for stroke prophylaxis, s/p right great toe amputation.  PAD s/p stent to rt ant tibial artery, s/p percutaneous angioplasty of the right mid popliteal artery and the right tibioperoneal trunk followed by TMA on the right on 11/02/23, and left BKA. He was admitted with worsening pain and infection in right foot. s/p Excision of 4-5th right metatarsal on 01/11/2024  Clinical Impression  73 yo Male s/p excision of 4-5th right metatarsal secondary to prolonged infection to right foot. Patient has a significant PMH including left BKA. He does has prosthesis and is able to bear weight through LLE well. Initially patient expressed desire to return home after discharge from hospitalization. He lives alone and has 4-5 steps to enter house with right railing. Per podiatrist patient can minimally weight bear in right heel with post op shoe for transfers. He is mod I for bed mobility. He does require verbal cues for weight bearing precautions including to advance RLE forward prior to transferring to stand to minimize weight bearing in foot. Upon standing, patient was able to pivot to bedside chair while maintaining weight bearing precautions. Patient was taken to PT gym in bedside chair and educated on safe stair negotiation for maintaining weight bearing precautions. Patient was able to side step up steps using BUE on railing, leading with LLE ascending, RLE descending to maintain RLE minimal heel weight bearing. Patient expressed desire to return home. However he doesn't have 24/7 support. He does have wheelchair to use around the house however upon additional discussion he reports it will not fit into the  bathroom or bedroom. When asked how patient would go to the bathroom at home he stated, I will just get up and walk on my heel to the bathroom. Concerned patient would not be able to maintain weight bearing precautions and would try ambulating on right foot. He would benefit from additional skilled PT intervention to improve understanding of WB precautions and increase safety with mobility.         If plan is discharge home, recommend the following: A lot of help with walking and/or transfers;A lot of help with bathing/dressing/bathroom;Assistance with cooking/housework;Assist for transportation;Direct supervision/assist for medications management;Help with stairs or ramp for entrance   Can travel by private vehicle   No    Equipment Recommendations None recommended by PT  Recommendations for Other Services       Functional Status Assessment Patient has had a recent decline in their functional status and/or demonstrates limited ability to make significant improvements in function in a reasonable and predictable amount of time     Precautions / Restrictions Precautions Precautions: Fall Recall of Precautions/Restrictions: Intact Precaution/Restrictions Comments: no recent falls Restrictions Weight Bearing Restrictions Per Provider Order: Yes RLE Weight Bearing Per Provider Order:  (min WB in RLE, use heel for transfers) Other Position/Activity Restrictions: Per MD order, min WB in RLE, use heel for transfers      Mobility  Bed Mobility Overal bed mobility: Independent                  Transfers Overall transfer level: Needs assistance Equipment used: Rolling walker (2 wheels) Transfers: Bed to chair/wheelchair/BSC   Stand pivot transfers: Contact guard assist  General transfer comment: Required min VCs to advance RLE forward to maintain minimal weight bearing. Able to maintain heel weight bearing during pivot transfer.    Ambulation/Gait                General Gait Details: N/A, per MD orders, limit weight bearing to RLE to heel weight bearing for transfers;  Stairs Stairs: Yes Stairs assistance: Min assist Stair Management: One rail Right Number of Stairs: 4 General stair comments: step to negotiation, ascending with LLE, descending with RLE; able to maintain heel WB with post op shoe; required min VCs for sequencing/positioning  Wheelchair Mobility     Tilt Bed    Modified Rankin (Stroke Patients Only)       Balance Overall balance assessment: Needs assistance Sitting-balance support: Feet supported Sitting balance-Leahy Scale: Good     Standing balance support: Bilateral upper extremity supported, Reliant on assistive device for balance   Standing balance comment: able to maintain minimal WB to RLE heel for pivot transfer with CGA,                             Pertinent Vitals/Pain Pain Assessment Pain Assessment: 0-10 Pain Score: 6  Pain Location: right foot Pain Descriptors / Indicators: Shooting Pain Intervention(s): Limited activity within patient's tolerance, Monitored during session, Repositioned    Home Living Family/patient expects to be discharged to:: Private residence Living Arrangements: Alone Available Help at Discharge: Family;Friend(s);Available PRN/intermittently Type of Home: House Home Access: Stairs to enter Entrance Stairs-Rails: Right Entrance Stairs-Number of Steps: 5   Home Layout: One level Home Equipment: Agricultural consultant (2 wheels);Cane - single point;Wheelchair - manual;Grab bars - tub/shower;Tub bench      Prior Function Prior Level of Function : Independent/Modified Independent;Driving             Mobility Comments: has L LE prosthetic. ADLs Comments: Pt was at [facility] prior to recent hospital admission after recent infection in right foot; He has wheelchair at home, but states it will not fit in bathroom or bedroom     Extremity/Trunk Assessment    Upper Extremity Assessment Upper Extremity Assessment: Overall WFL for tasks assessed    Lower Extremity Assessment Lower Extremity Assessment: LLE deficits/detail;RLE deficits/detail RLE Deficits / Details: 4/5 hip/knee, ankle not tested as recent surgery LLE Deficits / Details: 5/5 for hip/knee    Cervical / Trunk Assessment Cervical / Trunk Assessment: Normal  Communication   Communication Communication: No apparent difficulties    Cognition Arousal: Alert Behavior During Therapy: WFL for tasks assessed/performed   PT - Cognitive impairments: No apparent impairments                       PT - Cognition Comments: oriented x4 Following commands: Intact       Cueing Cueing Techniques: Verbal cues     General Comments      Exercises Other Exercises Other Exercises: Educ re: PoC, DC recs, WBing precautions   Assessment/Plan    PT Assessment Patient needs continued PT services  PT Problem List Pain;Decreased activity tolerance;Decreased safety awareness;Decreased mobility;Decreased knowledge of precautions       PT Treatment Interventions DME instruction;Balance training;Gait training;Stair training;Functional mobility training;Therapeutic activities;Therapeutic exercise;Patient/family education;Wheelchair mobility training    PT Goals (Current goals can be found in the Care Plan section)  Acute Rehab PT Goals Patient Stated Goal: to go home PT Goal Formulation: With patient Time For Goal  Achievement: 01/26/24 Potential to Achieve Goals: Poor    Frequency Min 1X/week     Co-evaluation               AM-PAC PT 6 Clicks Mobility  Outcome Measure Help needed turning from your back to your side while in a flat bed without using bedrails?: None Help needed moving from lying on your back to sitting on the side of a flat bed without using bedrails?: None Help needed moving to and from a bed to a chair (including a wheelchair)?: A Little Help  needed standing up from a chair using your arms (e.g., wheelchair or bedside chair)?: A Little Help needed to walk in hospital room?: A Lot Help needed climbing 3-5 steps with a railing? : A Little 6 Click Score: 19    End of Session Equipment Utilized During Treatment: Gait belt Activity Tolerance: Patient tolerated treatment well Patient left: in chair;with call bell/phone within reach;with chair alarm set Nurse Communication: Mobility status;Weight bearing status PT Visit Diagnosis: Other abnormalities of gait and mobility (R26.89)    Time: 9144-9076 PT Time Calculation (min) (ACUTE ONLY): 28 min   Charges:   PT Evaluation $PT Eval Low Complexity: 1 Low   PT General Charges $$ ACUTE PT VISIT: 1 Visit         Makiyah Zentz PT, DPT 01/12/2024, 2:23 PM

## 2024-01-13 ENCOUNTER — Inpatient Hospital Stay

## 2024-01-13 DIAGNOSIS — L089 Local infection of the skin and subcutaneous tissue, unspecified: Secondary | ICD-10-CM | POA: Diagnosis not present

## 2024-01-13 LAB — BASIC METABOLIC PANEL WITH GFR
Anion gap: 7 (ref 5–15)
BUN: 14 mg/dL (ref 8–23)
CO2: 22 mmol/L (ref 22–32)
Calcium: 8.4 mg/dL — ABNORMAL LOW (ref 8.9–10.3)
Chloride: 108 mmol/L (ref 98–111)
Creatinine, Ser: 1.26 mg/dL — ABNORMAL HIGH (ref 0.61–1.24)
GFR, Estimated: 60 mL/min — ABNORMAL LOW (ref >60)
Glucose, Bld: 115 mg/dL — ABNORMAL HIGH (ref 70–99)
Potassium: 3.6 mmol/L (ref 3.5–5.1)
Sodium: 137 mmol/L (ref 135–145)

## 2024-01-13 LAB — CBC
HCT: 29.7 % — ABNORMAL LOW (ref 39.0–52.0)
Hemoglobin: 9.6 g/dL — ABNORMAL LOW (ref 13.0–17.0)
MCH: 28.3 pg (ref 26.0–34.0)
MCHC: 32.3 g/dL (ref 30.0–36.0)
MCV: 87.6 fL (ref 80.0–100.0)
Platelets: 129 K/uL — ABNORMAL LOW (ref 150–400)
RBC: 3.39 MIL/uL — ABNORMAL LOW (ref 4.22–5.81)
RDW: 13.7 % (ref 11.5–15.5)
WBC: 6.1 K/uL (ref 4.0–10.5)
nRBC: 0 % (ref 0.0–0.2)

## 2024-01-13 LAB — GLUCOSE, CAPILLARY
Glucose-Capillary: 103 mg/dL — ABNORMAL HIGH (ref 70–99)
Glucose-Capillary: 145 mg/dL — ABNORMAL HIGH (ref 70–99)
Glucose-Capillary: 85 mg/dL (ref 70–99)
Glucose-Capillary: 136 mg/dL — ABNORMAL HIGH (ref 70–99)

## 2024-01-13 LAB — HEPARIN LEVEL (UNFRACTIONATED)
Heparin Unfractionated: 0.35 [IU]/mL (ref 0.30–0.70)
Heparin Unfractionated: 0.39 [IU]/mL (ref 0.30–0.70)

## 2024-01-13 LAB — APTT: aPTT: 81 s — ABNORMAL HIGH (ref 24–36)

## 2024-01-13 LAB — VANCOMYCIN, TROUGH: Vancomycin Tr: 31 ug/mL (ref 15–20)

## 2024-01-13 MED ORDER — SODIUM CHLORIDE 0.9% FLUSH
10.0000 mL | Freq: Two times a day (BID) | INTRAVENOUS | Status: DC
  Administered 2024-01-13 – 2024-01-16 (×6): 10 mL

## 2024-01-13 MED ORDER — SODIUM CHLORIDE 0.9% FLUSH: 10.0000 mL | INTRAVENOUS | Status: DC | PRN

## 2024-01-13 MED ORDER — CHLORHEXIDINE GLUCONATE CLOTH 2 % EX PADS
6.0000 | MEDICATED_PAD | Freq: Every day | CUTANEOUS | Status: DC
  Administered 2024-01-13 – 2024-01-16 (×4): 6 via TOPICAL

## 2024-01-13 NOTE — Progress Notes (Signed)
 PODIATRY: PROGRESS NOTE    Surgery:  Procedure(s) (LRB): IRRIGATION AND DEBRIDEMENT FOOT (Right) POD:  2 Days Post-Op  O/N: NAEON  Subjective:  Patient resting comfortably at bedside.  Denies F/C/N/V/SOB/CP. Denies acute calf pain.    PHYSICAL EXAMINATION: BP 91/60 (BP Location: Left Arm)   Pulse (!) 59   Temp 98.1 F (36.7 C) (Oral)   Resp 18   Ht 6' 3 (1.905 m)   Wt 82.6 kg   SpO2 100%   BMI 22.75 kg/m ? GEN: NAD. AOX3. ? RESP: Non-labored breathing on RA.? ABD: NT/ND of all four quadrants.? NEURO: Moving all four extremities spontaneously. ? ? FOCUSED LOWER EXTREMITY EXAMINATION:? NEURO: ? - LOPS - diminished gross touch  - No paresthesias elicited on examination. ??  VASCULAR: ? - DP/PT - diminished  - Focal edema noted adjacent to surgical site ; as expected for post-operative state. ??  MSK: ? - s/p L BKA  - S/p R TMA  - TTP none? - No calf tenderness RLE ?  DERM: ? - Dressings - with sanguinous strikethrough - Surgical site well coapted. ?medially and centrally - the lateral aspect of incision well coapted with maceration noted to adjacent skin bridge without frank dehiscence (improved on 01/13/24 examination). no purulent / active drainage. ? - No proximal streaking. No malodor. ? - No clinical signs of infection noted. ??            Results for orders placed or performed during the hospital encounter of 01/10/24  Culture, blood (Routine X 2) w Reflex to ID Panel     Status: None (Preliminary result)   Collection Time: 01/10/24 12:33 PM   Specimen: BLOOD  Result Value Ref Range Status   Specimen Description BLOOD RIGHT ANTECUBITAL  Final   Special Requests   Final    BOTTLES DRAWN AEROBIC AND ANAEROBIC Blood Culture results may not be optimal due to an inadequate volume of blood received in culture bottles   Culture   Final    NO GROWTH 3 DAYS Performed at [facility], [address] [place], [place] [address]    Report  Status PENDING  Incomplete  Blood culture (single)     Status: None (Preliminary result)   Collection Time: 01/10/24  1:28 PM   Specimen: BLOOD  Result Value Ref Range Status   Specimen Description BLOOD BLOOD LEFT HAND  Final   Special Requests   Final    BOTTLES DRAWN AEROBIC AND ANAEROBIC Blood Culture adequate volume   Culture   Final    NO GROWTH 3 DAYS Performed at [facility], [address], [place], [place] [address]    Report Status PENDING  Incomplete  Aerobic/Anaerobic Culture w Gram Stain (surgical/deep wound)     Status: None (Preliminary result)   Collection Time: 01/11/24 11:12 AM   Specimen: Foot, Right; Tissue  Result Value Ref Range Status   Specimen Description   Final    WOUND Performed at [facility], [address] [place], [place] [address]    Special Requests   Final    RIGHT FOOT TISSUE SWAB Performed at [facility], [address] [place], [place] [address]    Gram Stain NO WBC SEEN RARE GRAM NEGATIVE RODS   Final   Culture   Final    FEW GRAM NEGATIVE RODS IDENTIFICATION AND SUSCEPTIBILITIES TO FOLLOW Performed at [facility], [address], [place], [place] [address]    Report Status PENDING  Incomplete  Aerobic/Anaerobic Culture w Gram Stain (surgical/deep wound)     Status: None (Preliminary result)   Collection Time: 01/11/24 11:12 AM   Specimen: Foot, Right; Blood  Result Value Ref Range Status   Specimen Description   Final    WOUND Performed at [facility], [address] [place], [place] [address]    Special Requests   Final    BONE CULTURE RIGHT FOOT CUP Performed at [facility], [address] [place], [place] [address]    Gram Stain   Final    RARE WBC PRESENT, PREDOMINANTLY PMN NO ORGANISMS SEEN    Culture   Final    RARE ENTEROBACTER CLOACAE CRITICAL RESULT CALLED TO, READ BACK BY AND VERIFIED WITH: RN DANIAL BEAL [phone] AT 0955, ADC Performed at [facility], [address], [place], [place] [address]    Report Status PENDING  Incomplete   Organism ID, Bacteria ENTEROBACTER CLOACAE  Final      Susceptibility   Enterobacter cloacae - MIC*    CEFEPIME  0.5 SENSITIVE Sensitive     GENTAMICIN  8 INTERMEDIATE Intermediate     MEROPENEM  <=0.25 SENSITIVE Sensitive     TRIMETH /SULFA  >=320 RESISTANT Resistant     PIP/TAZO Value in next row Intermediate ug/mL     32 INTERMEDIATEThis is a modified FDA-approved test that has been validated and its performance characteristics determined by the reporting laboratory.  This laboratory is certified under the Clinical Laboratory Improvement Amendments CLIA as qualified to perform high complexity clinical laboratory testing.    * RARE ENTEROBACTER CLOACAE    ID Type Source Tests Collected by Time Destination  1 : right foot tissue/.bone Tissue Foot, Right SURGICAL PATHOLOGY [name], DPM 01/11/2024 1024   A : Culture, right foot Tissue Foot, Right AEROBIC/ANAEROBIC CULTURE W GRAM STAIN (SURGICAL/DEEP WOUND) [name], DPM 01/11/2024 1013   B : Bone Culture, Right foot Blood Foot, Right AEROBIC/ANAEROBIC CULTURE W VONNE STAIN (SURGICAL/DEEP WOUND) [name], DPM 01/11/2024 1017      ASSESSMENT:?  Andrew Harding is a 73 y.o. male ?s/p revision RIGHT TMA ; doing well   New diabetic foot infection with multidrug-resistant organism. Diabetes with neuropathy Peripheral vascular disease Acute osteomyelitis  PLAN:? - Activity: NWB to the RLE ; would recommend removal of the post operative shoe while in bed - possibly with addition of a heel offloading boot (if patient is amenable) given there is mild redness at the lateral proximal aspect of the incision that likely represents pressure from shoe or way patient is leaning/ favoring foot in bed. Order placed for offloading boot.   - Wound Care: Podiatry to conduct post-op. Materials: Betadine  / Adaptic / Xeroform / Gauze (4x4) / Kerlix / 4 inch ACE  bandage Instructions: *Podiatry to conduct post-op. Supplement Reccs: Okay to reinforce with dry gauze / loose ACE overlap prn strikethrough. Please leave underlayer intact.  - ABX: Appreciate assistance with antibiotic stewardship from medicine / pharmacy / ID services.  Anti-infectives (From admission, onward)    Start     Dose/Rate Route Frequency Ordered Stop   01/12/24 1315  ceFEPIme  (MAXIPIME ) 2 g in sodium chloride  0.9 % 100 mL IVPB        2 g 200 mL/hr over 30 Minutes Intravenous Every 8 hours 01/12/24 1311     01/11/24 0100  vancomycin  (VANCOREADY) IVPB 750 mg/150 mL        750 mg 150 mL/hr over 60 Minutes Intravenous Every 12 hours  01/10/24 1340     01/10/24 1330  ceFEPIme  (MAXIPIME ) 2 g in sodium chloride  0.9 % 100 mL IVPB  Status:  Discontinued        2 g 200 mL/hr over 30 Minutes Intravenous Every 12 hours 01/10/24 1324 01/12/24 1311   01/10/24 1230  piperacillin -tazobactam (ZOSYN ) IVPB 3.375 g        3.375 g 100 mL/hr over 30 Minutes Intravenous  Once 01/10/24 1218 01/10/24 1355   01/10/24 1230  vancomycin  (VANCOREADY) IVPB 2000 mg/400 mL        2,000 mg 200 mL/hr over 120 Minutes Intravenous  Once 01/10/24 1218 01/10/24 1512       - Dispo: SNF ; possible vascular intervention?

## 2024-01-13 NOTE — Plan of Care (Signed)
  Problem: Education: Goal: Individualized Educational Video(s) Outcome: Progressing   Problem: Fluid Volume: Goal: Ability to maintain a balanced intake and output will improve Outcome: Progressing   Problem: Health Behavior/Discharge Planning: Goal: Ability to manage health-related needs will improve Outcome: Progressing   Problem: Nutritional: Goal: Maintenance of adequate nutrition will improve Outcome: Progressing

## 2024-01-13 NOTE — Consult Note (Signed)
 PHARMACY - ANTICOAGULATION CONSULT NOTE  Pharmacy Consult for heparin  Indication: atrial fibrillation  No Known Allergies  Patient Measurements: Height: 6' 3 (190.5 cm) Weight: 82.6 kg (182 lb) IBW/kg (Calculated) : 84.5 HEPARIN  DW (KG): 82.6  Vital Signs: Temp: 98.7 F (37.1 C) (09/13 2354) Temp Source: Oral (09/13 2354) BP: 101/59 (09/13 2354) Pulse Rate: 58 (09/13 2354)  Labs: Recent Labs    01/10/24 1218 01/11/24 0507 01/12/24 0420 01/12/24 1433 01/13/24 0054  HGB 11.6* 10.5* 10.5*  --   --   HCT 36.5* 33.3* 32.6*  --   --   PLT 193 165 163  --   --   APTT  --   --   --  42* 81*  LABPROT  --   --   --  16.0*  --   INR  --   --   --  1.2  --   HEPARINUNFRC  --   --   --  0.27* 0.35  CREATININE 1.35* 1.31* 1.24  --   --     Estimated Creatinine Clearance: 61.1 mL/min (by C-G formula based on SCr of 1.24 mg/dL).   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) 09/03/2023   Atrial fibrillation with RVR (HCC) 08/19/2022   Bladder neck obstruction    Cellulitis 08/17/2022   Chronic kidney disease    Coronary artery disease    a.) s/p 4v CABG in 2014   Diabetes mellitus without complication (HCC)    Diabetic neuropathy (HCC)    Diabetic peripheral neuropathy (HCC)    Diabetic ulcer of toe of left foot associated with diabetes mellitus of other type, limited to breakdown of skin (HCC) 12/21/2017   Diverticulosis    Gout    Gram-negative bacteremia 05/11/2023   Heart murmur    History of osteomyelitis 05/31/2015   Hypercholesteremia    Hyperlipidemia    Hypertension    Hypotension due to hypovolemia 08/17/2022   Infection of left foot 08/19/2022   MSSA bacteremia 02/28/2023   Open wound of left foot with complication 05/10/2023   Osteomyelitis (HCC) 05/31/2015    Peripheral neuropathy    Postural dizziness with presyncope 02/26/2023   S/P BKA (below knee amputation) unilateral, 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 Past medical history notable for T2DM, peripheral neuropathy, CAD s/p CABG, HTN, afib (on eliquis ), PAD s/p stent, percutaneous angioplasty, and TMA on right foot on 11/02/2023, and left BKA. Takes clopidogrel  and apixaban  at home. However, per med rec patient has not taken apixaban  in almost 72h. CHADSVASC score 4 (HTN, age+1, DM, CAD/PAD.) Hemoglobin at baseline consistent with patient's history. Platelets below historical baseline ~low 200s.  Baseline labs 9/13 Hgb: 10.5     Plt: 163        INR, aPTT, HL pending  Goal of Therapy:  Heparin  level 0.3-0.7 units/ml aPTT 66-102 seconds Monitor platelets by anticoagulation  protocol: Yes   Plan:  9/14 @ 0054:  aPTT = 81,  HL = 0.35 - aPTT therapeutic X 1, now correlating with HL  - will use HL to guide dosing from here on - continue pt on current rate and recheck HL in 8 hrs Continue to monitor H&H and platelets  Zeya Balles D 01/13/2024,1:33 AM

## 2024-01-13 NOTE — Progress Notes (Signed)
 PROGRESS NOTE    Andrew Harding  FMW:[id number] DOB: Nov 12, 1950 DOA: 01/10/2024 PCP: Fernande Ophelia [name], MD     Brief Narrative:   From admission h and p  Andrew Harding Feeling is a 73 y.o. male with medical history significant for  DM II, peripheral neuropathy,CAD, s/p CABG, HTN, Atrial fibrillation with eliquis  for stroke prophylaxis, s/p right great toe amputation.  PAD s/p stent to rt ant tibial artery, s/p percutaneous angioplasty of the right mid popliteal artery and the right tibioperoneal trunk followed by TMA on the right on 11/02/23, and left BKA. Pathology from TMA was positive for osteomyelitis but margin was clear of osteo. Cultures from surgery grew out MRSA and enterococcus fecalis. He received treatment with 4 weeks of IV daptomycin  which was extended by two more weeks to complete 6 weeks of therapy on 12/14/2023.  The patient presented to podiatrists office on the morning of 01/10/1024 with complaints of worsening infection to his right foot. He was found to have drainage to the distal lateral aspect of the right foot from the most recent transmetatarsal amputation. MDRO enterobacter has been grown from this drainage. Susceptibilities demonstrated sensitivity to amikacin, aztreonam, cefepime , imipenem and meropenem . There was frank purulence from the wound today.    PICC line was placed prior to presentation in ED in anticipation of another course of outpatient antibiotics. He was sent to the ED for admission. I & D by podiatry has been scheduled for 01/11/2024.   The patient denies pain (he has severe neuropathy in that limb), fevers, chills, nausea, vomiting, diarrhea, constipation, cough, chest pain, neurological deficits, confusion, headaches.  Assessment & Plan:   Principal Problem:   Foot infection Active Problems:   Atrial fibrillation (HCC)   Benign essential hypertension   CKD stage 3a, GFR 45-59 ml/min (HCC)   CAD S/P CABG x 4   Controlled type 2 diabetes mellitus with neuropathy  (HCC)   S/P BKA (below knee amputation) unilateral, left (HCC)   PAD (peripheral artery disease) (HCC)   # Right foot osteomyelitis Complicated recent infectious course, see ID note for details. TMA performed 7/4, revision 8/22. 9/4 culture growing e faecalis, mdr. S/p excision of bone right 4th and 5th metatarsals 9/12 for ongoing osteo there with podiatry. Intra-op culture obtained, growing e faecalis - follow cultures - will d/c vanc, continue cefepime  - wound care per podiatry, appears to be healing appropriately - pt/ot   # Debility PT advising return to snf and patient is agreeable - TOC consulted  # PAD S/p thrombectomy, angioplasty, and stent placement right leg on 7/7 with dr. Marea. Vascular has been consulted to ensure patient is optimized. ABI shows signs mild/mod PAD - weekday team to determine if angiogram needed - home plavix  on hold - home statin - heparin  for home apixaban   # Chronic pain - home gabapentin   # CAD S/p cabg x4, asymptomatic - home metoprolol , statin - heparin   # HTN Bp well controlled - home metop  # A-fib, paroxysmal Rate controlled - home BB - heparin  pending vascular decision regarding angiogram  # T2DM Euglycemic - SSI  DVT prophylaxis: heparin  Code Status: full Family Communication: friend updated @ bedside 9/12  Level of care: Telemetry Surgical Status is: Inpatient Remains inpatient appropriate because: ongoing inpatinet evaluation and treatment    Consultants:  ID, podiatry  Procedures: See above  Antimicrobials:  See above    Subjective: No complaints, tolerating diet, pain controlled  Objective: Vitals:   01/12/24 2354 01/13/24  0407 01/13/24 0832 01/13/24 1151  BP: (!) 101/59 113/68 101/66 91/60  Pulse: (!) 58 65 66 (!) 59  Resp: 16 16 18 18   Temp: 98.7 F (37.1 C) 98.1 F (36.7 C) (!) 97.5 F (36.4 C) 98.1 F (36.7 C)  TempSrc: Oral Oral Oral Oral  SpO2: 100% 98% 100% 100%  Weight:      Height:        No intake or output data in the 24 hours ending 01/13/24 1337  Filed Weights   01/10/24 1112  Weight: 82.6 kg    Examination:  General exam: Appears calm and comfortable  Respiratory system: Clear to auscultation. Respiratory effort normal. Cardiovascular system: S1 & S2 heard, RR  Gastrointestinal system: Abdomen is nondistended, soft and nontender.   Central nervous system: Alert and oriented. No focal neurological deficits. Extremities: Symmetric 5 x 5 power. Bandage over right foot. S/p left BKA Skin: bandage right foot Psychiatry: Judgement and insight appear normal. Mood & affect appropriate.     Data Reviewed: I have personally reviewed following labs and imaging studies  CBC: Recent Labs  Lab 01/10/24 1218 01/11/24 0507 01/12/24 0420 01/13/24 0457  WBC 6.4 7.3 8.9 6.1  NEUTROABS 4.5  --   --   --   HGB 11.6* 10.5* 10.5* 9.6*  HCT 36.5* 33.3* 32.6* 29.7*  MCV 89.7 89.3 88.3 87.6  PLT 193 165 163 129*   Basic Metabolic Panel: Recent Labs  Lab 01/10/24 1218 01/10/24 1247 01/11/24 0507 01/12/24 0420 01/13/24 0457  NA 140  --  141 139 137  K 4.1  --  3.7 3.6 3.6  CL 107  --  107 108 108  CO2 23  --  23 24 22   GLUCOSE 147*  --  165* 129* 115*  BUN 12  --  13 13 14   CREATININE 1.35*  --  1.31* 1.24 1.26*  CALCIUM 8.9  --  8.5* 8.5* 8.4*  MG  --  2.0  --   --   --    GFR: Estimated Creatinine Clearance: 60.1 mL/min (A) (by C-G formula based on SCr of 1.26 mg/dL (H)). Liver Function Tests: No results for input(s): AST, ALT, ALKPHOS, BILITOT, PROT, ALBUMIN in the last 168 hours. No results for input(s): LIPASE, AMYLASE in the last 168 hours. No results for input(s): AMMONIA in the last 168 hours. Coagulation Profile: Recent Labs  Lab 01/12/24 1433  INR 1.2   Cardiac Enzymes: No results for input(s): CKTOTAL, CKMB, CKMBINDEX, TROPONINI in the last 168 hours. BNP (last 3 results) No results for input(s): PROBNP in the  last 8760 hours. HbA1C: No results for input(s): HGBA1C in the last 72 hours. CBG: Recent Labs  Lab 01/12/24 1207 01/12/24 1653 01/12/24 2047 01/13/24 0833 01/13/24 1151  GLUCAP 128* 199* 132* 103* 145*   Lipid Profile: No results for input(s): CHOL, HDL, LDLCALC, TRIG, CHOLHDL, LDLDIRECT in the last 72 hours. Thyroid Function Tests: No results for input(s): TSH, T4TOTAL, FREET4, T3FREE, THYROIDAB in the last 72 hours. Anemia Panel: No results for input(s): VITAMINB12, FOLATE, FERRITIN, TIBC, IRON , RETICCTPCT in the last 72 hours. Urine analysis:    Component Value Date/Time   COLORURINE YELLOW (A) 05/10/2023 0937   APPEARANCEUR HAZY (A) 05/10/2023 0937   APPEARANCEUR Hazy (A) 05/30/2022 1026   LABSPEC 1.017 05/10/2023 0937   PHURINE 5.0 05/10/2023 0937   GLUCOSEU 50 (A) 05/10/2023 0937   HGBUR MODERATE (A) 05/10/2023 0937   BILIRUBINUR NEGATIVE 05/10/2023 0937   BILIRUBINUR Negative 05/30/2022  1026   KETONESUR NEGATIVE 05/10/2023 0937   PROTEINUR 100 (A) 05/10/2023 0937   UROBILINOGEN 0.2 04/05/2018 1042   NITRITE NEGATIVE 05/10/2023 0937   LEUKOCYTESUR NEGATIVE 05/10/2023 0937   Sepsis Labs: @LABRCNTIP (procalcitonin:4,lacticidven:4)  ) Recent Results (from the past 240 hours)  Culture, blood (Routine X 2) w Reflex to ID Panel     Status: None (Preliminary result)   Collection Time: 01/10/24 12:33 PM   Specimen: BLOOD  Result Value Ref Range Status   Specimen Description BLOOD RIGHT ANTECUBITAL  Final   Special Requests   Final    BOTTLES DRAWN AEROBIC AND ANAEROBIC Blood Culture results may not be optimal due to an inadequate volume of blood received in culture bottles   Culture   Final    NO GROWTH 3 DAYS Performed at [facility], [address], [place], [place] [address]    Report Status PENDING  Incomplete  Blood culture (single)     Status: None (Preliminary result)   Collection Time: 01/10/24  1:28 PM    Specimen: BLOOD  Result Value Ref Range Status   Specimen Description BLOOD BLOOD LEFT HAND  Final   Special Requests   Final    BOTTLES DRAWN AEROBIC AND ANAEROBIC Blood Culture adequate volume   Culture   Final    NO GROWTH 3 DAYS Performed at [facility], [address] [place], [place] [address]    Report Status PENDING  Incomplete  Aerobic/Anaerobic Culture w Gram Stain (surgical/deep wound)     Status: None (Preliminary result)   Collection Time: 01/11/24 11:12 AM   Specimen: Foot, Right; Tissue  Result Value Ref Range Status   Specimen Description   Final    WOUND Performed at [facility], [address] [place], [place] [address]    Special Requests   Final    RIGHT FOOT TISSUE SWAB Performed at [facility], [address] [place], [place] [address]    Gram Stain NO WBC SEEN RARE GRAM NEGATIVE RODS   Final   Culture   Final    FEW GRAM NEGATIVE RODS IDENTIFICATION AND SUSCEPTIBILITIES TO FOLLOW HOLDING FOR POSSIBLE ANAEROBE Performed at [facility], [address], [place], [place] [address]    Report Status PENDING  Incomplete  Aerobic/Anaerobic Culture w Gram Stain (surgical/deep wound)     Status: None (Preliminary result)   Collection Time: 01/11/24 11:12 AM   Specimen: Foot, Right; Blood  Result Value Ref Range Status   Specimen Description   Final    WOUND Performed at [facility], [address] [place], [place] [address]    Special Requests   Final    BONE CULTURE RIGHT FOOT CUP Performed at [facility], [address] [place], [place] [address]    Gram Stain   Final    RARE WBC PRESENT, PREDOMINANTLY PMN NO ORGANISMS SEEN    Culture   Final    RARE ENTEROBACTER CLOACAE CRITICAL RESULT CALLED TO, READ BACK BY AND VERIFIED WITH: RN DANIAL BEAL [phone] AT 0955, ADC HOLDING FOR POSSIBLE ANAEROBE Performed at [facility], [address] [place], [place] [address]    Report Status  PENDING  Incomplete   Organism ID, Bacteria ENTEROBACTER CLOACAE  Final      Susceptibility   Enterobacter cloacae - MIC*    CEFEPIME  0.5 SENSITIVE Sensitive     GENTAMICIN  8 INTERMEDIATE Intermediate     MEROPENEM  <=0.25 SENSITIVE Sensitive  TRIMETH /SULFA  >=320 RESISTANT Resistant     PIP/TAZO Value in next row Intermediate ug/mL     32 INTERMEDIATEThis is a modified FDA-approved test that has been validated and its performance characteristics determined by the reporting laboratory.  This laboratory is certified under the Clinical Laboratory Improvement Amendments CLIA as qualified to perform high complexity clinical laboratory testing.    * RARE ENTEROBACTER CLOACAE         Radiology Studies: DG Chest Port 1 View Result Date: 01/13/2024 CLINICAL DATA:  73 year old male PICC line placement. EXAM: PORTABLE CHEST 1 VIEW COMPARISON:  Portable chest 11/07/2023 and earlier. FINDINGS: Portable AP semi upright view at 0839 hours. Right upper extremity PICC line has been placed, tip projects about 8 mm below the expected cavoatrial junction level, although lower lung volumes on this view also. Prior sternotomy, CABG. Normal cardiac size and mediastinal contours. No pneumothorax. Allowing for portable technique the lungs are clear. Visualized tracheal air column is within normal limits. No acute osseous abnormality identified. Paucity of visible bowel gas. IMPRESSION: 1. Right upper extremity PICC line placed with satisfactory tip placement - near the cavoatrial junction - considering somewhat low lung volumes. 2. No acute cardiopulmonary abnormality. Electronically Signed   By: Harding Hurst M.D.   On: 01/13/2024 08:54   US  ARTERIAL ABI (SCREENING LOWER EXTREMITY) Result Date: 01/12/2024 CLINICAL DATA:  Delayed wound healing EXAM: NONINVASIVE PHYSIOLOGIC VASCULAR STUDY OF BILATERAL LOWER EXTREMITIES TECHNIQUE: Evaluation of both lower extremities were performed at rest, including calculation of  ankle-brachial indices with single level Doppler, pressure and pulse volume recording. COMPARISON:  None Available. FINDINGS: Right ABI:  1.89 Right Lower Extremity: Abnormal biphasic arterial waveforms at the ankle. > 1.4 Non diagnostic secondary to incompressible vessel calcifications (medial arterial sclerosis of [place]) IMPRESSION: Nondiagnostic right ankle-brachial index due to non compressibility of the arteries at the ankle. This is often seen in the setting of chronic medial arteriosclerosis. However, the arterial waveforms are abnormal in monophasic indicating potentially occlusive proximal peripheral arterial disease. If further imaging is clinically warranted, CTA of the aorta with lower extremity runoff could further evaluate. Electronically Signed   By: Wilkie Lent M.D.   On: 01/12/2024 08:02   US  EKG SITE RITE Result Date: 01/11/2024 If Site Rite image not attached, placement could not be confirmed due to current cardiac rhythm.       Scheduled Meds:  Chlorhexidine  Gluconate Cloth  6 each Topical Daily   clopidogrel   75 mg Oral Daily   cyanocobalamin   1,000 mcg Oral Daily   docusate sodium   100 mg Oral BID   gabapentin   300 mg Oral TID   insulin  aspart  0-9 Units Subcutaneous TID WC   insulin  glargine  28 Units Subcutaneous Daily   leptospermum manuka honey  1 Application Topical Daily   leptospermum manuka honey  1 Application Topical Daily   metoprolol  tartrate  12.5 mg Oral BID   pneumococcal 20-valent conjugate vaccine  0.5 mL Intramuscular Tomorrow-1000   pravastatin   80 mg Oral QHS   sodium chloride  flush  10-40 mL Intracatheter Q12H   traZODone   50 mg Oral QHS   Continuous Infusions:  ceFEPime  (MAXIPIME ) IV 2 g (01/13/24 0629)   heparin  1,200 Units/hr (01/13/24 0942)     LOS: 3 days     Devaughn KATHEE Ban, MD [facility]   If 7PM-7AM, please contact night-coverage [email or web address] Password [facility] 01/13/2024, 1:37 PM

## 2024-01-13 NOTE — Consult Note (Signed)
 PHARMACY - ANTICOAGULATION CONSULT NOTE  Pharmacy Consult for heparin  Indication: atrial fibrillation  No Known Allergies  Patient Measurements: Height: 6' 3 (190.5 cm) Weight: 82.6 kg (182 lb) IBW/kg (Calculated) : 84.5 HEPARIN  DW (KG): 82.6  Vital Signs: Temp: 97.5 F (36.4 C) (09/14 0832) Temp Source: Oral (09/14 0832) BP: 101/66 (09/14 0832) Pulse Rate: 66 (09/14 0832)  Labs: Recent Labs    01/11/24 0507 01/12/24 0420 01/12/24 1433 01/13/24 0054 01/13/24 0457  HGB 10.5* 10.5*  --   --  9.6*  HCT 33.3* 32.6*  --   --  29.7*  PLT 165 163  --   --  129*  APTT  --   --  42* 81*  --   LABPROT  --   --  16.0*  --   --   INR  --   --  1.2  --   --   HEPARINUNFRC  --   --  0.27* 0.35 0.39  CREATININE 1.31* 1.24  --   --  1.26*    Estimated Creatinine Clearance: 60.1 mL/min (A) (by C-G formula based on SCr of 1.26 mg/dL (H)).   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) 09/03/2023   Atrial fibrillation with RVR (HCC) 08/19/2022   Bladder neck obstruction    Cellulitis 08/17/2022   Chronic kidney disease    Coronary artery disease    a.) s/p 4v CABG in 2014   Diabetes mellitus without complication (HCC)    Diabetic neuropathy (HCC)    Diabetic peripheral neuropathy (HCC)    Diabetic ulcer of toe of left foot associated with diabetes mellitus of other type, limited to breakdown of skin (HCC) 12/21/2017   Diverticulosis    Gout    Gram-negative bacteremia 05/11/2023   Heart murmur    History of osteomyelitis 05/31/2015   Hypercholesteremia    Hyperlipidemia    Hypertension    Hypotension due to hypovolemia 08/17/2022   Infection of left foot 08/19/2022   MSSA bacteremia 02/28/2023   Open wound of left foot with complication 05/10/2023   Osteomyelitis (HCC)  05/31/2015   Peripheral neuropathy    Postural dizziness with presyncope 02/26/2023   S/P BKA (below knee amputation) unilateral, 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 Past medical history notable for T2DM, peripheral neuropathy, CAD s/p CABG, HTN, afib (on eliquis ), PAD s/p stent, percutaneous angioplasty, and TMA on right foot on 11/02/2023, and left BKA. Takes clopidogrel  and apixaban  at home. However, per med rec patient has not taken apixaban  in almost 72h. CHADSVASC score 4 (HTN, age+1, DM, CAD/PAD.) Hemoglobin at baseline consistent with patient's history. Platelets below historical baseline ~low 200s.  Baseline labs 9/13 Hgb: 10.5     Plt: 163        INR 1.2, aPTT 42 sec, HL 0.27  Goal of Therapy:  Heparin  level 0.3-0.7 units/ml aPTT 66-102 seconds Monitor  platelets by anticoagulation protocol: Yes  9/14 @ 0054:  aPTT = 81,  HL = 0.35, therapeutic x 1(transitioning to HL dosing) 9/14 @ 0457: HL 0.39, therapeutic x 2   Plan:  - continue pt on current heparin  rate of 1200 un/hr - recheck HL tomorrow with morning labs - continue to monitor H&H and platelets  Tiphani Mells A Tayven Renteria 01/13/2024,8:48 AM

## 2024-01-13 NOTE — NC FL2 (Signed)
 [place]  MEDICAID FL2 LEVEL OF CARE FORM     IDENTIFICATION  Patient Name: Andrew Harding Birthdate: 09-19-1950 Sex: male Admission Date (Current Location): 01/10/2024  [facility] [place] Number:  Chiropodist and Address:  [facility], [address], [place], [place] [address]      Provider Number: [id number]  Attending Physician Name and Address:  Kandis Devaughn Sayres, MD  Relative Name and Phone Number:  Rosina Jon BROCKS (Spouse)  [phone] [facility] Phone)    Current Level of Care: Hospital Recommended Level of Care: Skilled Nursing Facility Prior Approval Number:    Date Approved/Denied:   PASRR Number: [id number]  Discharge Plan: SNF    Current Diagnoses: Patient Active Problem List   Diagnosis Date Noted   Foot infection 01/10/2024   B12 deficiency anemia 11/08/2023   Iron  deficiency anemia 11/07/2023   PAD (peripheral artery disease) (HCC) 11/07/2023   Diabetic foot infection (HCC) 11/07/2023   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) 11/01/2023   Osteomyelitis of right foot (HCC) 11/01/2023   Atherosclerosis of native arteries of the extremities with ulceration (HCC) 09/03/2023   Atherosclerosis of native arteries of extremity with intermittent claudication (HCC) 09/02/2023   S/P BKA (below knee amputation) unilateral, 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   Gram-negative bacteremia 05/11/2023   Sepsis (HCC) 05/10/2023   Open wound of left foot with complication 05/10/2023   MSSA bacteremia 02/28/2023   Controlled type 2 diabetes mellitus with neuropathy (HCC) 02/27/2023   Postural dizziness with presyncope 02/26/2023   Diabetic peripheral neuropathy (HCC)    Wound drainage 09/02/2022   Diabetic foot infection with possible osteomyelitis, left  (HCC) 08/29/2022   S/P transmetatarsal amputation of foot, left (HCC) 08/29/2022   Acute osteomyelitis of left ankle or foot (HCC) 08/24/2022   Medication management 08/24/2022   Diabetic  infection of left foot (HCC) 08/24/2022   Infection of left foot 08/19/2022   Atrial fibrillation (HCC) 08/19/2022   Cellulitis 08/17/2022   Hypotension due to hypovolemia 08/17/2022   Gout 04/05/2018   Vitamin D  deficiency, unspecified 04/05/2018   Diabetic ulcer of toe of left foot associated with type 2 diabetes mellitus, limited to breakdown of skin (HCC) 12/21/2017   Status post amputation of toe of right foot (HCC) 11/01/2015   CKD stage 3a, GFR 45-59 ml/min (HCC) 10/08/2015   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   Osteomyelitis (HCC) 05/31/2015   Type 2 diabetes mellitus with stage 3b chronic kidney disease, with long-term current use of insulin  (HCC) 05/31/2015   History of osteomyelitis 05/31/2015   Benign essential hypertension 09/14/2014   Coronary artery disease 09/17/2012   Mixed hyperlipidemia 09/17/2012   CAD 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    Orientation RESPIRATION BLADDER Height & Weight     Self, Time, Situation    Continent Weight: 182 lb (82.6 kg) Height:  6' 3 (190.5 cm)  BEHAVIORAL SYMPTOMS/MOOD NEUROLOGICAL BOWEL NUTRITION STATUS      Continent Diet  AMBULATORY STATUS COMMUNICATION OF NEEDS Skin   Extensive Assist (non-weightbearing on his R foot) Verbally Surgical wounds (amputation and wound care)                       Personal Care Assistance Level of Assistance  Bathing, Feeding, Dressing Bathing Assistance: Limited assistance Feeding assistance: Independent Dressing Assistance: Limited assistance     Functional Limitations Info  Sight, Hearing, Speech Sight Info: Adequate Hearing Info: Adequate Speech Info: Adequate    SPECIAL CARE  FACTORS FREQUENCY  PT (By licensed PT), OT (By licensed OT)     PT Frequency: 5x a week OT Frequency: 3x a week            Contractures Contractures Info: Not present    Additional Factors Info  Code Status, Allergies Code Status Info: Full Allergies Info: No Known Allergies           Current  Medications (01/13/2024):  This is the current hospital active medication list Current Facility-Administered Medications  Medication Dose Route Frequency Provider Last Rate Last Admin   acetaminophen  (TYLENOL ) tablet 650 mg  650 mg Oral Q6H PRN [name], DPM       Or   acetaminophen  (TYLENOL ) suppository 650 mg  650 mg Rectal Q6H PRN [name], DPM       bisacodyl  (DULCOLAX) suppository 10 mg  10 mg Rectal Daily PRN [name], DPM       ceFEPIme  (MAXIPIME ) 2 g in sodium chloride  0.9 % 100 mL IVPB  2 g Intravenous Q8H Nazari, Walid A, RPH 200 mL/hr at 01/13/24 0629 2 g at 01/13/24 9370   Chlorhexidine  Gluconate Cloth 2 % PADS 6 each  6 each Topical Daily Wouk, Devaughn Sayres, MD       clopidogrel  (PLAVIX ) tablet 75 mg  75 mg Oral Daily Swayze, Ava, DO   75 mg at 01/13/24 0931   cyanocobalamin  (VITAMIN B12) tablet 1,000 mcg  1,000 mcg Oral Daily Swayze, Ava, DO   1,000 mcg at 01/13/24 0931   docusate sodium  (COLACE) capsule 100 mg  100 mg Oral BID Swayze, Ava, DO   100 mg at 01/12/24 2224   gabapentin  (NEURONTIN ) capsule 300 mg  300 mg Oral TID Swayze, Ava, DO   300 mg at 01/13/24 0931   heparin  ADULT infusion 100 units/mL (25000 units/250mL)  1,200 Units/hr Intravenous Continuous Viviana Leonor BROCKS, [place] 12 mL/hr at 01/13/24 0942 1,200 Units/hr at 01/13/24 9057   insulin  aspart (novoLOG ) injection 0-9 Units  0-9 Units Subcutaneous TID WC [name], DPM   2 Units at 01/12/24 1731   insulin  glargine (LANTUS ) injection 28 Units  28 Units Subcutaneous Daily Swayze, Ava, DO   28 Units at 01/12/24 1619   leptospermum manuka honey (MEDIHONEY) paste 1 Application  1 Application Topical Daily Fowler, Justin, DPM       leptospermum manuka honey (MEDIHONEY) paste 1 Application  1 Application Topical Daily Fowler, Justin, DPM       metoprolol  tartrate (LOPRESSOR ) tablet 12.5 mg  12.5 mg Oral BID Swayze, Ava, DO   12.5 mg at 01/13/24 0931   ondansetron  (ZOFRAN ) tablet 4 mg  4 mg Oral Q6H PRN  [name], DPM       Or   ondansetron  (ZOFRAN ) injection 4 mg  4 mg Intravenous Q6H PRN [name], DPM       oxyCODONE  (Oxy IR/ROXICODONE ) immediate release tablet 5 mg  5 mg Oral Q6H PRN Swayze, Ava, DO       pneumococcal 20-valent conjugate vaccine (PREVNAR 20) injection 0.5 mL  0.5 mL Intramuscular Tomorrow-1000 [name], DPM       polyethylene glycol (MIRALAX  / GLYCOLAX ) packet 17 g  17 g Oral Daily PRN Fowler, Justin, DPM       pravastatin  (PRAVACHOL ) tablet 80 mg  80 mg Oral QHS Swayze, Ava, DO   80 mg at 01/12/24 2224   sodium chloride  flush (NS) 0.9 % injection 10-40 mL  10-40 mL Intracatheter Q12H Wouk, Devaughn Sayres, MD  sodium chloride  flush (NS) 0.9 % injection 10-40 mL  10-40 mL Intracatheter PRN Wouk, Devaughn Sayres, MD       traZODone  (DESYREL ) tablet 50 mg  50 mg Oral QHS Swayze, Ava, DO   50 mg at 01/12/24 2225   vancomycin  (VANCOREADY) IVPB 750 mg/150 mL  750 mg Intravenous Q12H [name], DPM 150 mL/hr at 01/13/24 0020 750 mg at 01/13/24 0020     Discharge Medications: Please see discharge summary for a list of discharge medications.  Relevant Imaging Results:  Relevant Lab Results:   Additional Information SS# [id number]  Edsel DELENA Fischer, LCSW

## 2024-01-13 NOTE — Plan of Care (Signed)
  Problem: Education: Goal: Ability to describe self-care measures that may prevent or decrease complications (Diabetes Survival Skills Education) will improve Outcome: Progressing Goal: Individualized Educational Video(s) Outcome: Progressing   Problem: Coping: Goal: Ability to adjust to condition or change in health will improve Outcome: Progressing   Problem: Health Behavior/Discharge Planning: Goal: Ability to identify and utilize available resources and services will improve Outcome: Progressing Goal: Ability to manage health-related needs will improve Outcome: Progressing   Problem: Nutritional: Goal: Maintenance of adequate nutrition will improve Outcome: Progressing Goal: Progress toward achieving an optimal weight will improve Outcome: Progressing   Problem: Health Behavior/Discharge Planning: Goal: Ability to manage health-related needs will improve Outcome: Progressing   Problem: Activity: Goal: Risk for activity intolerance will decrease Outcome: Progressing   Problem: Elimination: Goal: Will not experience complications related to bowel motility Outcome: Progressing Goal: Will not experience complications related to urinary retention Outcome: Progressing

## 2024-01-13 NOTE — TOC CM/SW Note (Signed)
..  Transition of Care [facility]) - Inpatient Brief Assessment   Patient Details  Name: Andrew Harding MRN: [id number] Date of Birth: 1950/07/11  Transition of Care [facility]) CM/SW Contact:    Edsel DELENA Fischer, LCSW Phone Number: 01/13/2024, 11:00 AM   Clinical Narrative:  FL2 completed. TOC submitted FL2 for rehab placement. Waiting on response   Transition of Care Asessment:

## 2024-01-13 NOTE — Progress Notes (Signed)
 Peripherally Inserted Central Catheter Placement  The IV Nurse has discussed with the patient and/or persons authorized to consent for the patient, the purpose of this procedure and the potential benefits and risks involved with this procedure.  The benefits include less needle sticks, lab draws from the catheter, and the patient may be discharged home with the catheter. Risks include, but not limited to, infection, bleeding, blood clot (thrombus formation), and puncture of an artery; nerve damage and irregular heartbeat and possibility to perform a PICC exchange if needed/ordered by physician.  Alternatives to this procedure were also discussed.  Bard Power PICC patient education guide, fact sheet on infection prevention and patient information card has been provided to patient /or left at bedside.    PICC Placement Documentation  PICC Single Lumen 01/13/24 Right Cephalic 39 cm 0 cm (Active)  Indication for Insertion or Continuance of Line Prolonged intravenous therapies 01/13/24 0827  Exposed Catheter (cm) 0 cm 01/13/24 0827  Site Assessment Clean, Dry, Intact 01/13/24 0827  Line Status Flushed;Saline locked;Blood return noted 01/13/24 0827  Dressing Type Transparent;Securing device 01/13/24 0827  Dressing Status Antimicrobial disc/dressing in place;Clean, Dry, Intact 01/13/24 0827  Line Care Connections checked and tightened 01/13/24 0827  Line Adjustment (NICU/IV Team Only) No 01/13/24 0827  Dressing Intervention New dressing;Adhesive placed at insertion site (IV team only);Adhesive placed around edges of dressing (IV team/ICU RN only) 01/13/24 0827  Dressing Change Due 01/20/24 01/13/24 0827       Andrew Harding 01/13/2024, 8:28 AM

## 2024-01-14 DIAGNOSIS — Z89421 Acquired absence of other right toe(s): Secondary | ICD-10-CM

## 2024-01-14 DIAGNOSIS — B9689 Other specified bacterial agents as the cause of diseases classified elsewhere: Secondary | ICD-10-CM

## 2024-01-14 DIAGNOSIS — Z1620 Resistance to unspecified antibiotic: Secondary | ICD-10-CM

## 2024-01-14 DIAGNOSIS — M86171 Other acute osteomyelitis, right ankle and foot: Secondary | ICD-10-CM

## 2024-01-14 DIAGNOSIS — Z89411 Acquired absence of right great toe: Secondary | ICD-10-CM

## 2024-01-14 DIAGNOSIS — Z794 Long term (current) use of insulin: Secondary | ICD-10-CM

## 2024-01-14 DIAGNOSIS — L089 Local infection of the skin and subcutaneous tissue, unspecified: Secondary | ICD-10-CM | POA: Diagnosis not present

## 2024-01-14 LAB — CBC
HCT: 31.3 % — ABNORMAL LOW (ref 39.0–52.0)
Hemoglobin: 10 g/dL — ABNORMAL LOW (ref 13.0–17.0)
MCH: 28.2 pg (ref 26.0–34.0)
MCHC: 31.9 g/dL (ref 30.0–36.0)
MCV: 88.2 fL (ref 80.0–100.0)
Platelets: 145 K/uL — ABNORMAL LOW (ref 150–400)
RBC: 3.55 MIL/uL — ABNORMAL LOW (ref 4.22–5.81)
RDW: 13.5 % (ref 11.5–15.5)
WBC: 7.4 K/uL (ref 4.0–10.5)
nRBC: 0 % (ref 0.0–0.2)

## 2024-01-14 LAB — GLUCOSE, CAPILLARY
Glucose-Capillary: 125 mg/dL — ABNORMAL HIGH (ref 70–99)
Glucose-Capillary: 121 mg/dL — ABNORMAL HIGH (ref 70–99)
Glucose-Capillary: 91 mg/dL (ref 70–99)
Glucose-Capillary: 217 mg/dL — ABNORMAL HIGH (ref 70–99)

## 2024-01-14 LAB — AEROBIC/ANAEROBIC CULTURE W GRAM STAIN (SURGICAL/DEEP WOUND): Gram Stain: NONE SEEN

## 2024-01-14 LAB — HEPARIN LEVEL (UNFRACTIONATED)
Heparin Unfractionated: 0.23 [IU]/mL — ABNORMAL LOW (ref 0.30–0.70)
Heparin Unfractionated: 0.2 [IU]/mL — ABNORMAL LOW (ref 0.30–0.70)
Heparin Unfractionated: 0.31 [IU]/mL (ref 0.30–0.70)

## 2024-01-14 LAB — SURGICAL PATHOLOGY

## 2024-01-14 MED ORDER — METRONIDAZOLE 500 MG PO TABS
500.0000 mg | ORAL_TABLET | Freq: Two times a day (BID) | ORAL | Status: AC
  Administered 2024-01-14 – 2024-01-15 (×4): 500 mg via ORAL
  Filled 2024-01-14 (×4): qty 1

## 2024-01-14 MED ORDER — HEPARIN BOLUS VIA INFUSION
1250.0000 [IU] | Freq: Once | INTRAVENOUS | Status: AC
  Administered 2024-01-14: 1250 [IU] via INTRAVENOUS
  Filled 2024-01-14: qty 1250

## 2024-01-14 NOTE — H&P (View-Only) (Signed)
  Progress Note    01/14/2024 1:01 PM 3 Days Post-Op  Subjective:  Andrew Harding is a 73 yo male who is now POD #3 from 1. Excision bone right fourth metatarsal 2. Excision bone right fifth metatarsal.    Vitals:   01/14/24 0556 01/14/24 1042  BP: (!) 108/59 107/60  Pulse: 71 (!) 58  Resp: 12 16  Temp: 97.7 F (36.5 C) 98.4 F (36.9 C)  SpO2: 100% 99%   Physical Exam: Cardiac:  RRR, Normal S1, S2. No murmurs. Lungs:  Clear on auscultation throughout, No rales, rhonchi or wheezing. Non labored breathing.  Incisions:  Right foot. Dressing clean dry and intact.  Extremities:  Left BKA, Right lower extremity with TMA. Warm and positive pulses.  Abdomen:  Positive bowel sounds throughout. Soft, non tender and non distended.  Neurologic: AAOX3, Answers questions and follows commands.   CBC    Component Value Date/Time   WBC 7.4 01/14/2024 0345   RBC 3.55 (L) 01/14/2024 0345   HGB 10.0 (L) 01/14/2024 0345   HCT 31.3 (L) 01/14/2024 0345   PLT 145 (L) 01/14/2024 0345   MCV 88.2 01/14/2024 0345   MCH 28.2 01/14/2024 0345   MCHC 31.9 01/14/2024 0345   RDW 13.5 01/14/2024 0345   LYMPHSABS 1.2 01/10/2024 1218   MONOABS 0.4 01/10/2024 1218   EOSABS 0.3 01/10/2024 1218   BASOSABS 0.1 01/10/2024 1218    BMET    Component Value Date/Time   NA 137 01/13/2024 0457   K 3.6 01/13/2024 0457   CL 108 01/13/2024 0457   CO2 22 01/13/2024 0457   GLUCOSE 115 (H) 01/13/2024 0457   GLUCOSE 402 (H) 09/17/2012 1417   BUN 14 01/13/2024 0457   CREATININE 1.26 (H) 01/13/2024 0457   CALCIUM 8.4 (L) 01/13/2024 0457   GFRNONAA 60 (L) 01/13/2024 0457   GFRAA 58 (L) 06/02/2015 0656    INR    Component Value Date/Time   INR 1.2 01/12/2024 1433     Intake/Output Summary (Last 24 hours) at 01/14/2024 1301 Last data filed at 01/14/2024 1024 Gross per 24 hour  Intake 720 ml  Output --  Net 720 ml     Assessment/Plan:  73 y.o. male is s/p SEE ABOVE 3 Days Post-Op   PLAN Vascular Surgery  plans on taking the patient to the vascular lab tomorrow 01/15/24 for a right lower extremity angiogram with possible intervention. I discussed in detail with the patient the procedure, benefits, risks and complications. Patient verbalizes his understanding and wishes to proceed. I answered all his questions today. Patient will be made NPO after midnight tonight for the procedure tomorrow.   I discussed the case in detail with Dr Cordella Shawl MD and he agrees with the plan.   DVT prophylaxis:  Heparin  Infusion.    [name] [facility] 01/14/2024 1:01 PM

## 2024-01-14 NOTE — Progress Notes (Signed)
 PROGRESS NOTE    Andrew Harding  FMW:[id number] DOB: 1951/01/07 DOA: 01/10/2024 PCP: Fernande Ophelia [name], MD     Brief Narrative:   From admission h and p  Andrew Harding Feeling is a 73 y.o. male with medical history significant for  DM II, peripheral neuropathy,CAD, s/p CABG, HTN, Atrial fibrillation with eliquis  for stroke prophylaxis, s/p right great toe amputation.  PAD s/p stent to rt ant tibial artery, s/p percutaneous angioplasty of the right mid popliteal artery and the right tibioperoneal trunk followed by TMA on the right on 11/02/23, and left BKA. Pathology from TMA was positive for osteomyelitis but margin was clear of osteo. Cultures from surgery grew out MRSA and enterococcus fecalis. He received treatment with 4 weeks of IV daptomycin  which was extended by two more weeks to complete 6 weeks of therapy on 12/14/2023.  The patient presented to podiatrists office on the morning of 01/10/1024 with complaints of worsening infection to his right foot. He was found to have drainage to the distal lateral aspect of the right foot from the most recent transmetatarsal amputation. MDRO enterobacter has been grown from this drainage. Susceptibilities demonstrated sensitivity to amikacin, aztreonam, cefepime , imipenem and meropenem . There was frank purulence from the wound today.    PICC line was placed prior to presentation in ED in anticipation of another course of outpatient antibiotics. He was sent to the ED for admission. I & D by podiatry has been scheduled for 01/11/2024.   The patient denies pain (he has severe neuropathy in that limb), fevers, chills, nausea, vomiting, diarrhea, constipation, cough, chest pain, neurological deficits, confusion, headaches.  Assessment & Plan:   Principal Problem:   Foot infection Active Problems:   Atrial fibrillation (HCC)   Benign essential hypertension   CKD stage 3a, GFR 45-59 ml/min (HCC)   CAD S/P CABG x 4   Controlled type 2 diabetes mellitus with neuropathy  (HCC)   S/P BKA (below knee amputation) unilateral, left (HCC)   PAD (peripheral artery disease) (HCC)   # Right foot osteomyelitis Complicated recent infectious course, see ID note for details. TMA performed 7/4, revision 8/22. 9/4 culture growing e faecalis, mdr. S/p excision of bone right 4th and 5th metatarsals 9/12 for ongoing osteo there with podiatry. Intra-op culture obtained, growing e faecalis, sensitive to cefepime  - continue cefepime , ID recs for today pending - wound care per podiatry, appears to be healing appropriately - pt/ot   # Debility PT advising return to snf and patient is agreeable but only to certain places, if no agreeable bed offers will want home with home health - [facility] consulted  # PAD S/p thrombectomy, angioplasty, and stent placement right leg on 7/7 with dr. Marea. Vascular has been consulted to ensure patient is optimized. ABI shows signs mild/mod PAD - weekday team today to determine if angiogram needed - home plavix  on hold - home statin - heparin  for home apixaban   # Chronic pain - home gabapentin   # CAD S/p cabg x4, asymptomatic - home metoprolol , statin - heparin   # HTN Bp well controlled - home metop  # A-fib, paroxysmal Rate controlled - home BB - heparin  pending vascular decision regarding angiogram  # T2DM Euglycemic - SSI  DVT prophylaxis: heparin  Code Status: full Family Communication: friend updated @ bedside 9/12  Level of care: Telemetry Surgical Status is: Inpatient Remains inpatient appropriate because: ongoing inpatinet evaluation and treatment    Consultants:  ID, podiatry  Procedures: See above  Antimicrobials:  See  above    Subjective: No complaints, tolerating diet, pain controlled  Objective: Vitals:   01/13/24 2334 01/14/24 0400 01/14/24 0556 01/14/24 1042  BP: 103/62 100/60 (!) 108/59 107/60  Pulse: 67 70 71 (!) 58  Resp: 16 12 12 16   Temp: 98.3 F (36.8 C) 97.7 F (36.5 C) 97.7 F (36.5 C)  98.4 F (36.9 C)  TempSrc:  Oral Oral   SpO2: 100% 99% 100% 99%  Weight:      Height:        Intake/Output Summary (Last 24 hours) at 01/14/2024 1343 Last data filed at 01/14/2024 1024 Gross per 24 hour  Intake 720 ml  Output --  Net 720 ml    Filed Weights   01/10/24 1112  Weight: 82.6 kg    Examination:  General exam: Appears calm and comfortable  Respiratory system: Clear to auscultation. Respiratory effort normal. Cardiovascular system: S1 & S2 heard, RR  Gastrointestinal system: Abdomen is nondistended, soft and nontender.   Central nervous system: Alert and oriented. No focal neurological deficits. Extremities: Symmetric 5 x 5 power. Bandage over right foot. S/p left BKA Skin: bandage right foot Psychiatry: Judgement and insight appear normal. Mood & affect appropriate.     Data Reviewed: I have personally reviewed following labs and imaging studies  CBC: Recent Labs  Lab 01/10/24 1218 01/11/24 0507 01/12/24 0420 01/13/24 0457 01/14/24 0345  WBC 6.4 7.3 8.9 6.1 7.4  NEUTROABS 4.5  --   --   --   --   HGB 11.6* 10.5* 10.5* 9.6* 10.0*  HCT 36.5* 33.3* 32.6* 29.7* 31.3*  MCV 89.7 89.3 88.3 87.6 88.2  PLT 193 165 163 129* 145*   Basic Metabolic Panel: Recent Labs  Lab 01/10/24 1218 01/10/24 1247 01/11/24 0507 01/12/24 0420 01/13/24 0457  NA 140  --  141 139 137  K 4.1  --  3.7 3.6 3.6  CL 107  --  107 108 108  CO2 23  --  23 24 22   GLUCOSE 147*  --  165* 129* 115*  BUN 12  --  13 13 14   CREATININE 1.35*  --  1.31* 1.24 1.26*  CALCIUM 8.9  --  8.5* 8.5* 8.4*  MG  --  2.0  --   --   --    GFR: Estimated Creatinine Clearance: 60.1 mL/min (A) (by C-G formula based on SCr of 1.26 mg/dL (H)). Liver Function Tests: No results for input(s): AST, ALT, ALKPHOS, BILITOT, PROT, ALBUMIN in the last 168 hours. No results for input(s): LIPASE, AMYLASE in the last 168 hours. No results for input(s): AMMONIA in the last 168  hours. Coagulation Profile: Recent Labs  Lab 01/12/24 1433  INR 1.2   Cardiac Enzymes: No results for input(s): CKTOTAL, CKMB, CKMBINDEX, TROPONINI in the last 168 hours. BNP (last 3 results) No results for input(s): PROBNP in the last 8760 hours. HbA1C: No results for input(s): HGBA1C in the last 72 hours. CBG: Recent Labs  Lab 01/13/24 1151 01/13/24 1636 01/13/24 1938 01/14/24 0804 01/14/24 1150  GLUCAP 145* 85 136* 125* 121*   Lipid Profile: No results for input(s): CHOL, HDL, LDLCALC, TRIG, CHOLHDL, LDLDIRECT in the last 72 hours. Thyroid Function Tests: No results for input(s): TSH, T4TOTAL, FREET4, T3FREE, THYROIDAB in the last 72 hours. Anemia Panel: No results for input(s): VITAMINB12, FOLATE, FERRITIN, TIBC, IRON , RETICCTPCT in the last 72 hours. Urine analysis:    Component Value Date/Time   COLORURINE YELLOW (A) 05/10/2023 0937   APPEARANCEUR HAZY (  A) 05/10/2023 0937   APPEARANCEUR Hazy (A) 05/30/2022 1026   LABSPEC 1.017 05/10/2023 0937   PHURINE 5.0 05/10/2023 0937   GLUCOSEU 50 (A) 05/10/2023 0937   HGBUR MODERATE (A) 05/10/2023 0937   BILIRUBINUR NEGATIVE 05/10/2023 0937   BILIRUBINUR Negative 05/30/2022 1026   KETONESUR NEGATIVE 05/10/2023 0937   PROTEINUR 100 (A) 05/10/2023 0937   UROBILINOGEN 0.2 04/05/2018 1042   NITRITE NEGATIVE 05/10/2023 0937   LEUKOCYTESUR NEGATIVE 05/10/2023 0937   Sepsis Labs: @LABRCNTIP (procalcitonin:4,lacticidven:4)  ) Recent Results (from the past 240 hours)  Culture, blood (Routine X 2) w Reflex to ID Panel     Status: None (Preliminary result)   Collection Time: 01/10/24 12:33 PM   Specimen: BLOOD  Result Value Ref Range Status   Specimen Description BLOOD RIGHT ANTECUBITAL  Final   Special Requests   Final    BOTTLES DRAWN AEROBIC AND ANAEROBIC Blood Culture results may not be optimal due to an inadequate volume of blood received in culture bottles   Culture   Final     NO GROWTH 4 DAYS Performed at [facility], [address], [place], [place] [address]    Report Status PENDING  Incomplete  Blood culture (single)     Status: None (Preliminary result)   Collection Time: 01/10/24  1:28 PM   Specimen: BLOOD  Result Value Ref Range Status   Specimen Description BLOOD BLOOD LEFT HAND  Final   Special Requests   Final    BOTTLES DRAWN AEROBIC AND ANAEROBIC Blood Culture adequate volume   Culture   Final    NO GROWTH 4 DAYS Performed at [facility], [address] [place], [place] [address]    Report Status PENDING  Incomplete  Aerobic/Anaerobic Culture w Gram Stain (surgical/deep wound)     Status: None   Collection Time: 01/11/24 11:12 AM   Specimen: Foot, Right; Tissue  Result Value Ref Range Status   Specimen Description   Final    WOUND Performed at [facility], [address], [place], [place] [address]    Special Requests   Final    RIGHT FOOT TISSUE SWAB Performed at [facility], [address] [place], [place] [address]    Gram Stain NO WBC SEEN RARE GRAM NEGATIVE RODS   Final   Culture   Final    FEW ENTEROBACTER CLOACAE FEW PREVOTELLA MELANINOGENICA BETA LACTAMASE POSITIVE Performed at [facility], [address], [place], [place] [address]    Report Status 01/14/2024 FINAL  Final   Organism ID, Bacteria ENTEROBACTER CLOACAE  Final      Susceptibility   Enterobacter cloacae - MIC*    CEFEPIME  0.5 SENSITIVE Sensitive     GENTAMICIN  8 INTERMEDIATE Intermediate     MEROPENEM  <=0.25 SENSITIVE Sensitive     TRIMETH /SULFA  >=320 RESISTANT Resistant     PIP/TAZO Value in next row Intermediate ug/mL     64 INTERMEDIATEThis is a modified FDA-approved test that has been validated and its performance characteristics determined by the reporting laboratory.  This laboratory is certified under the Clinical Laboratory Improvement Amendments CLIA as qualified to perform high complexity  clinical laboratory testing.    * FEW ENTEROBACTER CLOACAE  Aerobic/Anaerobic Culture w Gram Stain (surgical/deep wound)     Status: None   Collection Time: 01/11/24 11:12 AM   Specimen: Foot, Right; Blood  Result Value Ref Range Status   Specimen Description   Final    WOUND Performed at [facility],  [address] [place], [place] [address]    Special Requests   Final    BONE CULTURE RIGHT FOOT CUP Performed at [facility], [address] [place], [place] [address]    Gram Stain   Final    RARE WBC PRESENT, PREDOMINANTLY PMN NO ORGANISMS SEEN    Culture   Final    RARE ENTEROBACTER CLOACAE CRITICAL RESULT CALLED TO, READ BACK BY AND VERIFIED WITH: RN DANIAL BEAL [phone] AT 0955, ADC FEW PREVOTELLA MELANINOGENICA BETA LACTAMASE POSITIVE Performed at [facility], [address], [place], [place] [address]    Report Status 01/14/2024 FINAL  Final   Organism ID, Bacteria ENTEROBACTER CLOACAE  Final      Susceptibility   Enterobacter cloacae - MIC*    CEFEPIME  0.5 SENSITIVE Sensitive     GENTAMICIN  8 INTERMEDIATE Intermediate     MEROPENEM  <=0.25 SENSITIVE Sensitive     TRIMETH /SULFA  >=320 RESISTANT Resistant     PIP/TAZO Value in next row Intermediate ug/mL     32 INTERMEDIATEThis is a modified FDA-approved test that has been validated and its performance characteristics determined by the reporting laboratory.  This laboratory is certified under the Clinical Laboratory Improvement Amendments CLIA as qualified to perform high complexity clinical laboratory testing.    * RARE ENTEROBACTER CLOACAE         Radiology Studies: DG Chest Port 1 View Result Date: 01/13/2024 CLINICAL DATA:  73 year old male PICC line placement. EXAM: PORTABLE CHEST 1 VIEW COMPARISON:  Portable chest 11/07/2023 and earlier. FINDINGS: Portable AP semi upright view at 0839 hours. Right upper extremity PICC line has been placed, tip projects about 8 mm below the expected  cavoatrial junction level, although lower lung volumes on this view also. Prior sternotomy, CABG. Normal cardiac size and mediastinal contours. No pneumothorax. Allowing for portable technique the lungs are clear. Visualized tracheal air column is within normal limits. No acute osseous abnormality identified. Paucity of visible bowel gas. IMPRESSION: 1. Right upper extremity PICC line placed with satisfactory tip placement - near the cavoatrial junction - considering somewhat low lung volumes. 2. No acute cardiopulmonary abnormality. Electronically Signed   By: Harding Hurst M.D.   On: 01/13/2024 08:54        Scheduled Meds:  Chlorhexidine  Gluconate Cloth  6 each Topical Daily   cyanocobalamin   1,000 mcg Oral Daily   docusate sodium   100 mg Oral BID   gabapentin   300 mg Oral TID   insulin  aspart  0-9 Units Subcutaneous TID WC   insulin  glargine  28 Units Subcutaneous Daily   leptospermum manuka honey  1 Application Topical Daily   leptospermum manuka honey  1 Application Topical Daily   metoprolol  tartrate  12.5 mg Oral BID   metroNIDAZOLE   500 mg Oral Q12H   pneumococcal 20-valent conjugate vaccine  0.5 mL Intramuscular Tomorrow-1000   pravastatin   80 mg Oral QHS   sodium chloride  flush  10-40 mL Intracatheter Q12H   traZODone   50 mg Oral QHS   Continuous Infusions:  ceFEPime  (MAXIPIME ) IV 2 g (01/14/24 0523)   heparin  1,350 Units/hr (01/14/24 9362)     LOS: 4 days     Andrew KATHEE Ban, MD [facility]   If 7PM-7AM, please contact night-coverage [email or web address] Password [facility] 01/14/2024, 1:43 PM

## 2024-01-14 NOTE — Consult Note (Signed)
 PHARMACY - ANTICOAGULATION CONSULT NOTE  Pharmacy Consult for heparin  Indication: atrial fibrillation  No Known Allergies  Patient Measurements: Height: 6' 3 (190.5 cm) Weight: 82.6 kg (182 lb) IBW/kg (Calculated) : 84.5 HEPARIN  DW (KG): 82.6  Vital Signs: Temp: 98.4 F (36.9 C) (09/15 1042) Temp Source: Oral (09/15 0556) BP: 107/60 (09/15 1042) Pulse Rate: 58 (09/15 1042)  Labs: Recent Labs    01/12/24 0420 01/12/24 1433 01/12/24 1433 01/13/24 0054 01/13/24 0457 01/14/24 0345 01/14/24 1255  HGB 10.5*  --   --   --  9.6* 10.0*  --   HCT 32.6*  --   --   --  29.7* 31.3*  --   PLT 163  --   --   --  129* 145*  --   APTT  --  42*  --  81*  --   --   --   LABPROT  --  16.0*  --   --   --   --   --   INR  --  1.2  --   --   --   --   --   HEPARINUNFRC  --  0.27*   < > 0.35 0.39 0.23* 0.20*  CREATININE 1.24  --   --   --  1.26*  --   --    < > = values in this interval not displayed.    Estimated Creatinine Clearance: 60.1 mL/min (A) (by C-G formula based on SCr of 1.26 mg/dL (H)).   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) 09/03/2023   Atrial fibrillation with RVR (HCC) 08/19/2022   Bladder neck obstruction    Cellulitis 08/17/2022   Chronic kidney disease    Coronary artery disease    a.) s/p 4v CABG in 2014   Diabetes mellitus without complication (HCC)    Diabetic neuropathy (HCC)    Diabetic peripheral neuropathy (HCC)    Diabetic ulcer of toe of left foot associated with diabetes mellitus of other type, limited to breakdown of skin (HCC) 12/21/2017   Diverticulosis    Gout    Gram-negative bacteremia 05/11/2023   Heart murmur    History of osteomyelitis 05/31/2015   Hypercholesteremia    Hyperlipidemia    Hypertension    Hypotension due to hypovolemia  08/17/2022   Infection of left foot 08/19/2022   MSSA bacteremia 02/28/2023   Open wound of left foot with complication 05/10/2023   Osteomyelitis (HCC) 05/31/2015   Peripheral neuropathy    Postural dizziness with presyncope 02/26/2023   S/P BKA (below knee amputation) unilateral, 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 Past medical history notable for T2DM, peripheral neuropathy, CAD s/p CABG, HTN, afib (on eliquis ), PAD s/p stent, percutaneous angioplasty, and TMA on right foot on 11/02/2023, and left BKA. Takes clopidogrel  and apixaban  at home. However, per med rec patient has not taken apixaban   in almost 72h. CHADSVASC score 4 (HTN, age+1, DM, CAD/PAD.) Hemoglobin at baseline consistent with patient's history. Platelets below historical baseline ~low 200s.  Baseline labs 9/13 Hgb: 10.5     Plt: 163        INR 1.2, aPTT 42 sec, HL 0.27  Goal of Therapy:  Heparin  level 0.3-0.7 units/ml aPTT 66-102 seconds Monitor platelets by anticoagulation protocol: Yes  9/14 @ 0054:  aPTT = 81,  HL = 0.35, therapeutic x 1(transitioning to HL dosing) 9/14 @ 0457: HL 0.39, therapeutic x 2 9/15 @ 0345: HL 0.23, SUBtherapeutic  9/15 @ 1255: HL 0.20, SUBtherapeutic   Plan:  Give heparin  bolus of 1250 units x1 Increase heparin  infusion rate to 1500 units/hour Recheck HL 8 hrs after rate change  Continue to monitor H&H and platelets  Thank you for involving pharmacy in this patient's care.   Damien Napoleon, PharmD Clinical Pharmacist 01/14/2024 2:04 PM

## 2024-01-14 NOTE — Plan of Care (Signed)
  Problem: Education: Goal: Ability to describe self-care measures that may prevent or decrease complications (Diabetes Survival Skills Education) will improve Outcome: Progressing Goal: Individualized Educational Video(s) Outcome: Progressing   Problem: Coping: Goal: Ability to adjust to condition or change in health will improve Outcome: Progressing   Problem: Fluid Volume: Goal: Ability to maintain a balanced intake and output will improve Outcome: Progressing   Problem: Health Behavior/Discharge Planning: Goal: Ability to identify and utilize available resources and services will improve Outcome: Progressing Goal: Ability to manage health-related needs will improve Outcome: Progressing   Problem: Metabolic: Goal: Ability to maintain appropriate glucose levels will improve Outcome: Progressing   Problem: Nutritional: Goal: Maintenance of adequate nutrition will improve Outcome: Progressing Goal: Progress toward achieving an optimal weight will improve Outcome: Progressing   Problem: Skin Integrity: Goal: Risk for impaired skin integrity will decrease Outcome: Progressing   Problem: Tissue Perfusion: Goal: Adequacy of tissue perfusion will improve Outcome: Progressing   Problem: Health Behavior/Discharge Planning: Goal: Ability to manage health-related needs will improve Outcome: Progressing   Problem: Clinical Measurements: Goal: Ability to maintain clinical measurements within normal limits will improve Outcome: Progressing Goal: Will remain free from infection Outcome: Progressing Goal: Diagnostic test results will improve Outcome: Progressing Goal: Respiratory complications will improve Outcome: Progressing Goal: Cardiovascular complication will be avoided Outcome: Progressing   Problem: Activity: Goal: Risk for activity intolerance will decrease Outcome: Progressing   Problem: Nutrition: Goal: Adequate nutrition will be maintained Outcome:  Progressing   Problem: Coping: Goal: Level of anxiety will decrease Outcome: Progressing   Problem: Elimination: Goal: Will not experience complications related to bowel motility Outcome: Progressing Goal: Will not experience complications related to urinary retention Outcome: Progressing   Problem: Pain Managment: Goal: General experience of comfort will improve and/or be controlled Outcome: Progressing   Problem: Safety: Goal: Ability to remain free from injury will improve Outcome: Progressing   Problem: Skin Integrity: Goal: Risk for impaired skin integrity will decrease Outcome: Progressing   Problem: Education: Goal: Knowledge of disease or condition will improve Outcome: Progressing Goal: Understanding of medication regimen will improve Outcome: Progressing Goal: Individualized Educational Video(s) Outcome: Progressing

## 2024-01-14 NOTE — Progress Notes (Signed)
   01/14/24 0400  Assess: MEWS Score  Temp 97.7 F (36.5 C)  BP 100/60  MAP (mmHg) 74  Pulse Rate 70  Resp 12  SpO2 99 %  O2 Device Room Air  Assess: MEWS Score  MEWS Temp 0  MEWS Systolic 1  MEWS Pulse 0  MEWS RR 1  MEWS LOC 0  MEWS Score 2  MEWS Score Color Yellow  Assess: if the MEWS score is Yellow or Red  Were vital signs accurate and taken at a resting state? Yes  Does the patient meet 2 or more of the SIRS criteria? No  MEWS guidelines implemented  Yes, yellow  Treat  MEWS Interventions Considered administering scheduled or prn medications/treatments as ordered  Take Vital Signs  Increase Vital Sign Frequency  Yellow: Q2hr x1, continue Q4hrs until patient remains green for 12hrs  Escalate  MEWS: Escalate Yellow: Discuss with charge nurse and consider notifying provider and/or RRT  Notify: Charge Nurse/RN  Name of Charge Nurse/RN Notified Donna,RN  Assess: SIRS CRITERIA  SIRS Temperature  0  SIRS Respirations  0  SIRS Pulse 0  SIRS WBC 0  SIRS Score Sum  0

## 2024-01-14 NOTE — Progress Notes (Signed)
 PODIATRY: PROGRESS NOTE    Surgery:  Procedure(s) (LRB): IRRIGATION AND DEBRIDEMENT FOOT (Right) POD:  3 Days Post-Op  O/N: NAEON  Subjective:  Patient resting comfortably at bedside.  Made NPO tonight for poss vascular intervention tomorrow. PICC line in place. Patient expressed wish to not dispo to SNF - relays he has support (friend w/ medical background) that is able to conduct dressing changes and able to help with infusions prn need.  Denies F/C/N/V/SOB/CP. Denies acute calf pain.    PHYSICAL EXAMINATION: BP (!) 111/97 (BP Location: Left Arm)   Pulse 67   Temp 97.7 F (36.5 C) (Oral)   Resp 18   Ht 6' 3 (1.905 m)   Wt 82.6 kg   SpO2 100%   BMI 22.75 kg/m ? GEN: NAD. AOX3. ? RESP: Non-labored breathing on RA.? ABD: NT/ND of all four quadrants.? NEURO: Moving all four extremities spontaneously. ? ? FOCUSED LOWER EXTREMITY EXAMINATION:? NEURO: ? - LOPS - diminished gross touch  - No paresthesias elicited on examination. ??  VASCULAR: ? - DP/PT - diminished  - Focal edema noted adjacent to surgical site ; as expected for post-operative state. ??  MSK: ? - s/p L BKA  - S/p R TMA  - TTP none? - No calf tenderness RLE ?  DERM: ? - Dressings - with sanguinous strikethrough - Surgical site well coapted. ?medially and centrally - the lateral aspect of incision well coapted with maceration noted to adjacent skin bridge without frank dehiscence (improved on 01/13/24 examination). no purulent / active drainage. ? - No proximal streaking. No malodor. ? - No clinical signs of infection noted. ??                 Results for orders placed or performed during the hospital encounter of 01/10/24  Culture, blood (Routine X 2) w Reflex to ID Panel     Status: None (Preliminary result)   Collection Time: 01/10/24 12:33 PM   Specimen: BLOOD  Result Value Ref Range Status   Specimen Description BLOOD RIGHT ANTECUBITAL  Final   Special Requests   Final    BOTTLES  DRAWN AEROBIC AND ANAEROBIC Blood Culture results may not be optimal due to an inadequate volume of blood received in culture bottles   Culture   Final    NO GROWTH 4 DAYS Performed at [facility], [address], [place], [place] [address]    Report Status PENDING  Incomplete  Blood culture (single)     Status: None (Preliminary result)   Collection Time: 01/10/24  1:28 PM   Specimen: BLOOD  Result Value Ref Range Status   Specimen Description BLOOD BLOOD LEFT HAND  Final   Special Requests   Final    BOTTLES DRAWN AEROBIC AND ANAEROBIC Blood Culture adequate volume   Culture   Final    NO GROWTH 4 DAYS Performed at [facility], [address] [place], [place] [address]    Report Status PENDING  Incomplete  Aerobic/Anaerobic Culture w Gram Stain (surgical/deep wound)     Status: None   Collection Time: 01/11/24 11:12 AM   Specimen: Foot, Right; Tissue  Result Value Ref Range Status   Specimen Description   Final    WOUND Performed at [facility], [address], [place], [place] [address]    Special Requests   Final    RIGHT FOOT TISSUE SWAB Performed at [facility], [address], [place], [place] [address]    Gram  Stain NO WBC SEEN RARE GRAM NEGATIVE RODS   Final   Culture   Final    FEW ENTEROBACTER CLOACAE FEW PREVOTELLA MELANINOGENICA BETA LACTAMASE POSITIVE Performed at [facility], [address] [place], [place] [address]    Report Status 01/14/2024 FINAL  Final   Organism ID, Bacteria ENTEROBACTER CLOACAE  Final      Susceptibility   Enterobacter cloacae - MIC*    CEFEPIME  0.5 SENSITIVE Sensitive     GENTAMICIN  8 INTERMEDIATE Intermediate     MEROPENEM  <=0.25 SENSITIVE Sensitive     TRIMETH /SULFA  >=320 RESISTANT Resistant     PIP/TAZO Value in next row Intermediate ug/mL     64 INTERMEDIATEThis is a modified FDA-approved test that has been validated and its performance characteristics determined by the  reporting laboratory.  This laboratory is certified under the Clinical Laboratory Improvement Amendments CLIA as qualified to perform high complexity clinical laboratory testing.    * FEW ENTEROBACTER CLOACAE  Aerobic/Anaerobic Culture w Gram Stain (surgical/deep wound)     Status: None   Collection Time: 01/11/24 11:12 AM   Specimen: Foot, Right; Blood  Result Value Ref Range Status   Specimen Description   Final    WOUND Performed at [facility], [address], [place], [place] [address]    Special Requests   Final    BONE CULTURE RIGHT FOOT CUP Performed at [facility], [address] [place], [place] [address]    Gram Stain   Final    RARE WBC PRESENT, PREDOMINANTLY PMN NO ORGANISMS SEEN    Culture   Final    RARE ENTEROBACTER CLOACAE CRITICAL RESULT CALLED TO, READ BACK BY AND VERIFIED WITH: RN DANIAL BEAL [phone] AT [phone], ADC FEW PREVOTELLA MELANINOGENICA BETA LACTAMASE POSITIVE Performed at [facility], [address] [place], [place] [address]    Report Status 01/14/2024 FINAL  Final   Organism ID, Bacteria ENTEROBACTER CLOACAE  Final      Susceptibility   Enterobacter cloacae - MIC*    CEFEPIME  0.5 SENSITIVE Sensitive     GENTAMICIN  8 INTERMEDIATE Intermediate     MEROPENEM  <=0.25 SENSITIVE Sensitive     TRIMETH /SULFA  >=320 RESISTANT Resistant     PIP/TAZO Value in next row Intermediate ug/mL     32 INTERMEDIATEThis is a modified FDA-approved test that has been validated and its performance characteristics determined by the reporting laboratory.  This laboratory is certified under the Clinical Laboratory Improvement Amendments CLIA as qualified to perform high complexity clinical laboratory testing.    * RARE ENTEROBACTER CLOACAE    ID Type Source Tests Collected by Time Destination  1 : right foot tissue/.bone Tissue Foot, Right SURGICAL PATHOLOGY [name], DPM 01/11/2024 1024   A : Culture, right foot Tissue Foot, Right  AEROBIC/ANAEROBIC CULTURE W GRAM STAIN (SURGICAL/DEEP WOUND) [name], DPM 01/11/2024 1013   B : Bone Culture, Right foot Blood Foot, Right AEROBIC/ANAEROBIC CULTURE W VONNE STAIN (SURGICAL/DEEP WOUND) [name], DPM 01/11/2024 1017      ASSESSMENT:?  Andrew Harding is a 73 y.o. male ?s/p revision RIGHT TMA ; doing well   New diabetic foot infection with multidrug-resistant organism. Diabetes with neuropathy Peripheral vascular disease Acute osteomyelitis  PLAN:? - Activity: NWB to the RLE ; Prevalon pressure offloading boot while in bed.   - Wound Care: Daily  Materials: Betadine  / Adaptic / Xeroform / Mepilex gentle boarder (heel) Gauze (4x4) / Kerlix / 4 inch ACE bandage Instructions:  Betadine  soaked adaptic to the lateral incision site. Xeroform to medial incision sites. - this baseline dressing should be conducted daily. Betadine  paint to pressure injury areas ( areas noted prior to admission) cover with mepilex heel border dressings - this is to be done daily versus every other day (patient aware). Anchor the baseline dressing layer with 4x4 gauze, ABD pad, kerlix, and loosely placed 4 inch ACE.   Supplement Reccs: Okay to reinforce with dry gauze / loose ACE overlap prn strikethrough. Please leave underlayer intact.   - ABX: Appreciate assistance with antibiotic stewardship from medicine / pharmacy / ID services.  Anti-infectives (From admission, onward)    Start     Dose/Rate Route Frequency Ordered Stop   01/14/24 1400  metroNIDAZOLE  (FLAGYL ) tablet 500 mg        500 mg Oral Every 12 hours 01/14/24 1314     01/12/24 1315  ceFEPIme  (MAXIPIME ) 2 g in sodium chloride  0.9 % 100 mL IVPB        2 g 200 mL/hr over 30 Minutes Intravenous Every 8 hours 01/12/24 1311     01/11/24 0100  vancomycin  (VANCOREADY) IVPB 750 mg/150 mL  Status:  Discontinued        750 mg 150 mL/hr over 60 Minutes Intravenous Every 12 hours 01/10/24 1340 01/13/24 1332   01/10/24 1330  ceFEPIme   (MAXIPIME ) 2 g in sodium chloride  0.9 % 100 mL IVPB  Status:  Discontinued        2 g 200 mL/hr over 30 Minutes Intravenous Every 12 hours 01/10/24 1324 01/12/24 1311   01/10/24 1230  piperacillin -tazobactam (ZOSYN ) IVPB 3.375 g        3.375 g 100 mL/hr over 30 Minutes Intravenous  Once 01/10/24 1218 01/10/24 1355   01/10/24 1230  vancomycin  (VANCOREADY) IVPB 2000 mg/400 mL        2,000 mg 200 mL/hr over 120 Minutes Intravenous  Once 01/10/24 1218 01/10/24 1512       - Dispo: Vacsular intervention tomorrow 9/16 ; SNF versus home if possible

## 2024-01-14 NOTE — Progress Notes (Signed)
 Date of Admission:  01/10/2024      ID: Andrew Harding is a 73 y.o. male  Principal Problem:   Foot infection Active Problems:   Benign essential hypertension   CKD stage 3a, GFR 45-59 ml/min (HCC)   Atrial fibrillation (HCC)   CAD S/P CABG x 4   Controlled type 2 diabetes mellitus with neuropathy (HCC)   S/P BKA (below knee amputation) unilateral, left (HCC)   PAD (peripheral artery disease) (HCC)    Subjective: Pt is feeling much better   Medications:   Chlorhexidine  Gluconate Cloth  6 each Topical Daily   cyanocobalamin   1,000 mcg Oral Daily   docusate sodium   100 mg Oral BID   gabapentin   300 mg Oral TID   insulin  aspart  0-9 Units Subcutaneous TID WC   insulin  glargine  28 Units Subcutaneous Daily   leptospermum manuka honey  1 Application Topical Daily   leptospermum manuka honey  1 Application Topical Daily   metoprolol  tartrate  12.5 mg Oral BID   metroNIDAZOLE   500 mg Oral Q12H   pneumococcal 20-valent conjugate vaccine  0.5 mL Intramuscular Tomorrow-1000   pravastatin   80 mg Oral QHS   sodium chloride  flush  10-40 mL Intracatheter Q12H   traZODone   50 mg Oral QHS    Objective: Vital signs in last 24 hours: Patient Vitals for the past 24 hrs:  BP Temp Temp src Pulse Resp SpO2  01/14/24 1417 (!) 111/97 97.7 F (36.5 C) Oral 67 18 100 %  01/14/24 1042 107/60 98.4 F (36.9 C) -- (!) 58 16 99 %  01/14/24 0556 (!) 108/59 97.7 F (36.5 C) Oral 71 12 100 %  01/14/24 0400 100/60 97.7 F (36.5 C) Oral 70 12 99 %  01/13/24 2334 103/62 98.3 F (36.8 C) -- 67 16 100 %  01/13/24 1933 124/77 97.7 F (36.5 C) -- 90 16 100 %       PHYSICAL EXAM:  General: Alert, cooperative, no distress, appears stated age.  Heart: Regular rate and rhythm, no murmur, rub or gallop. Abdomen: Soft, non-tender,not distended. Bowel sounds normal. No masses Extremities:rt foot surgical dresisng not removed Pictures reviewed    Rt PICc Left BKA Skin: No rashes or lesions. Or  bruising Lymph: Cervical, supraclavicular normal. Neurologic: Grossly non-focal  Lab Results    Latest Ref Rng & Units 01/14/2024    3:45 AM 01/13/2024    4:57 AM 01/12/2024    4:20 AM  CBC  WBC 4.0 - 10.5 K/uL 7.4  6.1  8.9   Hemoglobin 13.0 - 17.0 g/dL 89.9  9.6  89.4   Hematocrit 39.0 - 52.0 % 31.3  29.7  32.6   Platelets 150 - 400 K/uL 145  129  163        Latest Ref Rng & Units 01/13/2024    4:57 AM 01/12/2024    4:20 AM 01/11/2024    5:07 AM  CMP  Glucose 70 - 99 mg/dL 884  870  834   BUN 8 - 23 mg/dL 14  13  13    Creatinine 0.61 - 1.24 mg/dL 8.73  8.75  8.68   Sodium 135 - 145 mmol/L 137  139  141   Potassium 3.5 - 5.1 mmol/L 3.6  3.6  3.7   Chloride 98 - 111 mmol/L 108  108  107   CO2 22 - 32 mmol/L 22  24  23    Calcium 8.9 - 10.3 mg/dL 8.4  8.5  8.5  Microbiology: WC sent BC NG    Assessment/Plan: Diabetic foot infection with peripheral artery disease involving the right foot at the site of prior TMA Patient underwent debridement with excision of the 4th and 5th metatarsal bones. Culture enterobacter cloacae resistant to oral antibiotic- pt is on cefepime -flagyl  added  On discharge will  change to IV ertapenem  for ease of administration as it is Q 24 compared to Q8 cefepime  Will need 6 weeks of IV antibiotic Vancomycin  discontinued  He has a complicated infectious history of the right foot His initial TMA was on 11/02/2023 and the culture then was MRSA and Enterococcus faecalis.  He had acute osteomyelitis of the bone and he was treated with 6 weeks of intravenous daptomycin  and completed around 12/21/2023 He then was on Levaquin and Augmentin  for Enterobacter cloacae and other organisms on 01/03/2024 repeat culture was done by Dr. Ashley and that had Enterobacter cloacae which is now resistant to oral medications  PAD Patient has undergone angio in July 2025 and also had mechanical thrombectomy of the right anterior tibial artery, balloon angioplasty and stent  placement.  He has also had stent placement in the right tibioperoneal trunk. Vascular has seen him this admission and they had ordered an ABI  History of left BKA  Diabetes mellitus  CAD status post CABG  A-fib well-controlled   Discussed the management with the patient and the hospitalist   OPAT orders   Diagnosis: Rt foot osteomyelitis Enterobacter cloacae DFI,  Baseline Creatinine 1.2  No Known Allergies  OPAT Orders Discharge antibiotics: Ertapenem  1 gram IV every 24 hours Duration: 6 weeks End Date: 02/21/24  [facility] Care Per Protocol:  Labs weekly while on IV antibiotics: _X_ CBC with differential  _X_ CMP _X_ CRP _X_ ESR   _X_ Please pull PIC at completion of IV antibiotics   Fax weekly lab results  promptly to [phone]  Clinic Follow Up Appt:01/31/24 at 10.45 Am with Dr.Lynnie Koehler   Call [phone] with any questions or critical value

## 2024-01-14 NOTE — Progress Notes (Signed)
  Progress Note    01/14/2024 1:01 PM 3 Days Post-Op  Subjective:  Andrew Harding is a 73 yo male who is now POD #3 from 1. Excision bone right fourth metatarsal 2. Excision bone right fifth metatarsal.    Vitals:   01/14/24 0556 01/14/24 1042  BP: (!) 108/59 107/60  Pulse: 71 (!) 58  Resp: 12 16  Temp: 97.7 F (36.5 C) 98.4 F (36.9 C)  SpO2: 100% 99%   Physical Exam: Cardiac:  RRR, Normal S1, S2. No murmurs. Lungs:  Clear on auscultation throughout, No rales, rhonchi or wheezing. Non labored breathing.  Incisions:  Right foot. Dressing clean dry and intact.  Extremities:  Left BKA, Right lower extremity with TMA. Warm and positive pulses.  Abdomen:  Positive bowel sounds throughout. Soft, non tender and non distended.  Neurologic: AAOX3, Answers questions and follows commands.   CBC    Component Value Date/Time   WBC 7.4 01/14/2024 0345   RBC 3.55 (L) 01/14/2024 0345   HGB 10.0 (L) 01/14/2024 0345   HCT 31.3 (L) 01/14/2024 0345   PLT 145 (L) 01/14/2024 0345   MCV 88.2 01/14/2024 0345   MCH 28.2 01/14/2024 0345   MCHC 31.9 01/14/2024 0345   RDW 13.5 01/14/2024 0345   LYMPHSABS 1.2 01/10/2024 1218   MONOABS 0.4 01/10/2024 1218   EOSABS 0.3 01/10/2024 1218   BASOSABS 0.1 01/10/2024 1218    BMET    Component Value Date/Time   NA 137 01/13/2024 0457   K 3.6 01/13/2024 0457   CL 108 01/13/2024 0457   CO2 22 01/13/2024 0457   GLUCOSE 115 (H) 01/13/2024 0457   GLUCOSE 402 (H) 09/17/2012 1417   BUN 14 01/13/2024 0457   CREATININE 1.26 (H) 01/13/2024 0457   CALCIUM 8.4 (L) 01/13/2024 0457   GFRNONAA 60 (L) 01/13/2024 0457   GFRAA 58 (L) 06/02/2015 0656    INR    Component Value Date/Time   INR 1.2 01/12/2024 1433     Intake/Output Summary (Last 24 hours) at 01/14/2024 1301 Last data filed at 01/14/2024 1024 Gross per 24 hour  Intake 720 ml  Output --  Net 720 ml     Assessment/Plan:  73 y.o. male is s/p SEE ABOVE 3 Days Post-Op   PLAN Vascular Surgery  plans on taking the patient to the vascular lab tomorrow 01/15/24 for a right lower extremity angiogram with possible intervention. I discussed in detail with the patient the procedure, benefits, risks and complications. Patient verbalizes his understanding and wishes to proceed. I answered all his questions today. Patient will be made NPO after midnight tonight for the procedure tomorrow.   I discussed the case in detail with Dr Cordella Shawl MD and he agrees with the plan.   DVT prophylaxis:  Heparin  Infusion.    [name] 01/14/2024 1:01 PM

## 2024-01-14 NOTE — Consult Note (Signed)
 PHARMACY - ANTICOAGULATION CONSULT NOTE  Pharmacy Consult for heparin  Indication: atrial fibrillation  No Known Allergies  Patient Measurements: Height: 6' 3 (190.5 cm) Weight: 82.6 kg (182 lb) IBW/kg (Calculated) : 84.5 HEPARIN  DW (KG): 82.6  Vital Signs: Temp: 97.7 F (36.5 C) (09/15 0400) Temp Source: Oral (09/15 0400) BP: 100/60 (09/15 0400) Pulse Rate: 70 (09/15 0400)  Labs: Recent Labs    01/12/24 0420 01/12/24 1433 01/12/24 1433 01/13/24 0054 01/13/24 0457 01/14/24 0345  HGB 10.5*  --   --   --  9.6* 10.0*  HCT 32.6*  --   --   --  29.7* 31.3*  PLT 163  --   --   --  129* 145*  APTT  --  42*  --  81*  --   --   LABPROT  --  16.0*  --   --   --   --   INR  --  1.2  --   --   --   --   HEPARINUNFRC  --  0.27*   < > 0.35 0.39 0.23*  CREATININE 1.24  --   --   --  1.26*  --    < > = values in this interval not displayed.    Estimated Creatinine Clearance: 60.1 mL/min (A) (by C-G formula based on SCr of 1.26 mg/dL (H)).   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) 09/03/2023   Atrial fibrillation with RVR (HCC) 08/19/2022   Bladder neck obstruction    Cellulitis 08/17/2022   Chronic kidney disease    Coronary artery disease    a.) s/p 4v CABG in 2014   Diabetes mellitus without complication (HCC)    Diabetic neuropathy (HCC)    Diabetic peripheral neuropathy (HCC)    Diabetic ulcer of toe of left foot associated with diabetes mellitus of other type, limited to breakdown of skin (HCC) 12/21/2017   Diverticulosis    Gout    Gram-negative bacteremia 05/11/2023   Heart murmur    History of osteomyelitis 05/31/2015   Hypercholesteremia    Hyperlipidemia    Hypertension    Hypotension due to hypovolemia 08/17/2022   Infection of left foot 08/19/2022   MSSA  bacteremia 02/28/2023   Open wound of left foot with complication 05/10/2023   Osteomyelitis (HCC) 05/31/2015   Peripheral neuropathy    Postural dizziness with presyncope 02/26/2023   S/P BKA (below knee amputation) unilateral, 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 Past medical history notable for T2DM, peripheral neuropathy, CAD s/p CABG, HTN, afib (on eliquis ), PAD s/p stent, percutaneous angioplasty, and TMA on right foot on 11/02/2023, and left BKA. Takes clopidogrel  and apixaban  at home. However, per med rec patient has not taken apixaban  in almost 72h. CHADSVASC score 4 (HTN, age+1, DM, CAD/PAD.) Hemoglobin at baseline consistent with patient's history. Platelets below historical baseline ~low 200s.  Baseline labs 9/13 Hgb: 10.5     Plt: 163        INR 1.2, aPTT 42 sec, HL 0.27  Goal of Therapy:  Heparin  level 0.3-0.7 units/ml aPTT 66-102 seconds Monitor platelets by anticoagulation protocol: Yes  9/14 @ 0054:  aPTT = 81,  HL = 0.35, therapeutic x 1(transitioning to HL dosing) 9/14 @ 0457: HL 0.39, therapeutic x 2 9/15 @ 0345: HL 0.23, SUBtherapeutic    Plan:  9/15:  HL @ 0345 = 0.23, SUBtherapeutic  - will order heparin  1250 units IV X 1 bolus and increase drip rate to 1350 units/hr - recheck HL 8 hrs after rate change  - continue to monitor H&H and platelets  Fredrico Beedle D 01/14/2024,5:09 AM

## 2024-01-15 ENCOUNTER — Encounter
Admission: EM | Disposition: A | Discharge status: Home Health | Payer: Self-pay | Source: Home / Self Care | Attending: Obstetrics and Gynecology

## 2024-01-15 DIAGNOSIS — I70235 Atherosclerosis of native arteries of right leg with ulceration of other part of foot: Secondary | ICD-10-CM

## 2024-01-15 DIAGNOSIS — D61818 Other pancytopenia: Secondary | ICD-10-CM

## 2024-01-15 DIAGNOSIS — L97519 Non-pressure chronic ulcer of other part of right foot with unspecified severity: Secondary | ICD-10-CM

## 2024-01-15 DIAGNOSIS — L089 Local infection of the skin and subcutaneous tissue, unspecified: Secondary | ICD-10-CM | POA: Diagnosis not present

## 2024-01-15 DIAGNOSIS — M869 Osteomyelitis, unspecified: Secondary | ICD-10-CM

## 2024-01-15 DIAGNOSIS — E538 Deficiency of other specified B group vitamins: Secondary | ICD-10-CM

## 2024-01-15 HISTORY — PX: LOWER EXTREMITY ANGIOGRAPHY: CATH118251

## 2024-01-15 LAB — CBC
HCT: 28.8 % — ABNORMAL LOW (ref 39.0–52.0)
Hemoglobin: 9.2 g/dL — ABNORMAL LOW (ref 13.0–17.0)
MCH: 28.4 pg (ref 26.0–34.0)
MCHC: 31.9 g/dL (ref 30.0–36.0)
MCV: 88.9 fL (ref 80.0–100.0)
Platelets: 131 K/uL — ABNORMAL LOW (ref 150–400)
RBC: 3.24 MIL/uL — ABNORMAL LOW (ref 4.22–5.81)
RDW: 13.4 % (ref 11.5–15.5)
WBC: 4.8 K/uL (ref 4.0–10.5)
nRBC: 0 % (ref 0.0–0.2)

## 2024-01-15 LAB — CULTURE, BLOOD (SINGLE)
Culture: NO GROWTH
Special Requests: ADEQUATE

## 2024-01-15 LAB — GLUCOSE, CAPILLARY
Glucose-Capillary: 96 mg/dL (ref 70–99)
Glucose-Capillary: 105 mg/dL — ABNORMAL HIGH (ref 70–99)
Glucose-Capillary: 94 mg/dL (ref 70–99)
Glucose-Capillary: 162 mg/dL — ABNORMAL HIGH (ref 70–99)

## 2024-01-15 LAB — CULTURE, BLOOD (ROUTINE X 2): Culture: NO GROWTH

## 2024-01-15 LAB — HEPARIN LEVEL (UNFRACTIONATED): Heparin Unfractionated: 0.35 [IU]/mL (ref 0.30–0.70)

## 2024-01-15 SURGERY — LOWER EXTREMITY ANGIOGRAPHY
Anesthesia: Moderate Sedation | Laterality: Right

## 2024-01-15 MED ORDER — MIDAZOLAM HCL 2 MG/ML PO SYRP: 8.0000 mg | ORAL_SOLUTION | Freq: Once | ORAL | Status: DC | PRN

## 2024-01-15 MED ORDER — CEFAZOLIN SODIUM-DEXTROSE 2-4 GM/100ML-% IV SOLN
2.0000 g | INTRAVENOUS | Status: AC
  Administered 2024-01-15: 2 g via INTRAVENOUS

## 2024-01-15 MED ORDER — FENTANYL CITRATE (PF) 100 MCG/2ML IJ SOLN
INTRAMUSCULAR | Status: AC
  Filled 2024-01-15: qty 2

## 2024-01-15 MED ORDER — SODIUM CHLORIDE 0.9 % IV SOLN: INTRAVENOUS | Status: DC

## 2024-01-15 MED ORDER — FENTANYL CITRATE (PF) 100 MCG/2ML IJ SOLN
INTRAMUSCULAR | Status: DC | PRN
  Administered 2024-01-15: 25 ug via INTRAVENOUS
  Administered 2024-01-15: 50 ug via INTRAVENOUS
  Administered 2024-01-15: 25 ug via INTRAVENOUS

## 2024-01-15 MED ORDER — MIDAZOLAM HCL 5 MG/5ML IJ SOLN
INTRAMUSCULAR | Status: AC
  Filled 2024-01-15: qty 5

## 2024-01-15 MED ORDER — METHYLPREDNISOLONE SODIUM SUCC 125 MG IJ SOLR: 125.0000 mg | Freq: Once | INTRAMUSCULAR | Status: DC | PRN

## 2024-01-15 MED ORDER — DIPHENHYDRAMINE HCL 50 MG/ML IJ SOLN: 50.0000 mg | Freq: Once | INTRAMUSCULAR | Status: DC | PRN

## 2024-01-15 MED ORDER — HEPARIN (PORCINE) 25000 UT/250ML-% IV SOLN: 1500.0000 [IU]/h | INTRAVENOUS | Status: DC

## 2024-01-15 MED ORDER — SODIUM CHLORIDE 0.9% FLUSH
3.0000 mL | Freq: Two times a day (BID) | INTRAVENOUS | Status: DC
  Administered 2024-01-15 – 2024-01-16 (×2): 3 mL via INTRAVENOUS

## 2024-01-15 MED ORDER — HEPARIN (PORCINE) IN NACL 1000-0.9 UT/500ML-% IV SOLN
INTRAVENOUS | Status: DC | PRN
  Administered 2024-01-15: 1000 mL

## 2024-01-15 MED ORDER — IODIXANOL 320 MG/ML IV SOLN
INTRAVENOUS | Status: DC | PRN
  Administered 2024-01-15: 80 mL via INTRA_ARTERIAL

## 2024-01-15 MED ORDER — FAMOTIDINE 20 MG PO TABS: 40.0000 mg | ORAL_TABLET | Freq: Once | ORAL | Status: DC | PRN

## 2024-01-15 MED ORDER — SODIUM CHLORIDE 0.9 % IV SOLN: 250.0000 mL | INTRAVENOUS | Status: DC | PRN

## 2024-01-15 MED ORDER — HEPARIN (PORCINE) 25000 UT/250ML-% IV SOLN
1500.0000 [IU]/h | INTRAVENOUS | Status: DC
  Administered 2024-01-16: 1500 [IU]/h via INTRAVENOUS
  Filled 2024-01-15: qty 250

## 2024-01-15 MED ORDER — HEPARIN SODIUM (PORCINE) 1000 UNIT/ML IJ SOLN
INTRAMUSCULAR | Status: DC | PRN
  Administered 2024-01-15: 6000 [IU] via INTRAVENOUS

## 2024-01-15 MED ORDER — SODIUM CHLORIDE 0.9 % IV SOLN
1.0000 g | INTRAVENOUS | Status: DC
  Administered 2024-01-16: 1 g via INTRAVENOUS
  Filled 2024-01-15 (×2): qty 1000

## 2024-01-15 MED ORDER — HEPARIN SODIUM (PORCINE) 1000 UNIT/ML IJ SOLN
INTRAMUSCULAR | Status: AC
  Filled 2024-01-15: qty 10

## 2024-01-15 MED ORDER — LIDOCAINE HCL (PF) 1 % IJ SOLN
INTRAMUSCULAR | Status: DC | PRN
  Administered 2024-01-15: 10 mL via INTRADERMAL

## 2024-01-15 MED ORDER — SODIUM CHLORIDE 0.9% FLUSH: 3.0000 mL | INTRAVENOUS | Status: DC | PRN

## 2024-01-15 MED ORDER — SODIUM CHLORIDE 0.9 % IV SOLN: INTRAVENOUS | Status: AC

## 2024-01-15 MED ORDER — CEFAZOLIN SODIUM-DEXTROSE 2-4 GM/100ML-% IV SOLN
INTRAVENOUS | Status: AC
  Filled 2024-01-15: qty 100

## 2024-01-15 MED ORDER — MIDAZOLAM HCL 2 MG/2ML IJ SOLN
INTRAMUSCULAR | Status: DC | PRN
  Administered 2024-01-15: .5 mg via INTRAVENOUS
  Administered 2024-01-15: 1 mg via INTRAVENOUS
  Administered 2024-01-15 (×2): .5 mg via INTRAVENOUS
  Administered 2024-01-15: 1 mg via INTRAVENOUS

## 2024-01-15 SURGICAL SUPPLY — 28 items
BALLOON JADE .014 3.5 X 20 (BALLOONS) IMPLANT
BALLOON JADE .0142.5 X 40 (BALLOONS) IMPLANT
BALLOON LUTONIX DCB 5X40X130 (BALLOONS) IMPLANT
BALLOON ULTRVRSE 2.5X40X150 (BALLOONS) IMPLANT
BALLOON ULTRVRSE 3.5X40X150 (BALLOONS) IMPLANT
CATH ANGIO 5F PIGTAIL 65CM (CATHETERS) IMPLANT
CATH BEACON TIP VERT 5FR 125 (CATHETERS) IMPLANT
CATH CXI SUPP 2.6F 150 ANG (CATHETERS) IMPLANT
COVER PROBE ULTRASOUND 5X96 (MISCELLANEOUS) IMPLANT
DEVICE PRESTO INFLATION (MISCELLANEOUS) IMPLANT
DEVICE STARCLOSE SE CLOSURE (Vascular Products) IMPLANT
GLIDEWIRE ADV .014X300CM (WIRE) IMPLANT
GLIDEWIRE ADV .035X260CM (WIRE) IMPLANT
GOWN STRL REUS W/ TWL LRG LVL3 (GOWN DISPOSABLE) ×1 IMPLANT
NDL ENTRY 21GA 7CM ECHOTIP (NEEDLE) IMPLANT
NEEDLE ENTRY 21GA 7CM ECHOTIP (NEEDLE) ×1 IMPLANT
PACK ANGIOGRAPHY (CUSTOM PROCEDURE TRAY) ×1 IMPLANT
SET INTRO CAPELLA COAXIAL (SET/KITS/TRAYS/PACK) IMPLANT
SHEATH BRITE TIP 5FRX11 (SHEATH) IMPLANT
SHEATH BRITE TIP 6FRX11 (SHEATH) IMPLANT
SHEATH RAABE 6FRX70 (SHEATH) IMPLANT
STENT ESPRIT BTK 3.5X28 SCAFF (Permanent Stent) IMPLANT
STENT LIFESTENT 5F 6X40X135 (Permanent Stent) IMPLANT
STENT OTW ESPRIT BTK 2.5X38 (Permanent Stent) IMPLANT
SYR MEDRAD MARK 7 150ML (SYRINGE) IMPLANT
TUBING CONTRAST HIGH PRESS 72 (TUBING) IMPLANT
WIRE COMMAND ST ANG 014 300 (WIRE) IMPLANT
WIRE J 3MM .035X145CM (WIRE) IMPLANT

## 2024-01-15 NOTE — Consult Note (Signed)
 PHARMACY - ANTICOAGULATION CONSULT NOTE  Pharmacy Consult for heparin  Indication: atrial fibrillation  No Known Allergies  Patient Measurements: Height: 6' 3 (190.5 cm) Weight: 82.6 kg (182 lb) IBW/kg (Calculated) : 84.5 HEPARIN  DW (KG): 82.6  Vital Signs: Temp: 98.3 F (36.8 C) (09/16 0204) Temp Source: Oral (09/16 0204) BP: 108/66 (09/16 0204) Pulse Rate: 69 (09/16 0204)  Labs: Recent Labs    01/12/24 1433 01/13/24 0054 01/13/24 0054 01/13/24 0457 01/14/24 0345 01/14/24 1255 01/14/24 2229 01/15/24 0454  HGB  --   --    < > 9.6* 10.0*  --   --  9.2*  HCT  --   --   --  29.7* 31.3*  --   --  28.8*  PLT  --   --   --  129* 145*  --   --  131*  APTT 42* 81*  --   --   --   --   --   --   LABPROT 16.0*  --   --   --   --   --   --   --   INR 1.2  --   --   --   --   --   --   --   HEPARINUNFRC 0.27* 0.35  --  0.39 0.23* 0.20* 0.31 0.35  CREATININE  --   --   --  1.26*  --   --   --   --    < > = values in this interval not displayed.    Estimated Creatinine Clearance: 60.1 mL/min (A) (by C-G formula based on SCr of 1.26 mg/dL (H)).   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) 09/03/2023   Atrial fibrillation with RVR (HCC) 08/19/2022   Bladder neck obstruction    Cellulitis 08/17/2022   Chronic kidney disease    Coronary artery disease    a.) s/p 4v CABG in 2014   Diabetes mellitus without complication (HCC)    Diabetic neuropathy (HCC)    Diabetic peripheral neuropathy (HCC)    Diabetic ulcer of toe of left foot associated with diabetes mellitus of other type, limited to breakdown of skin (HCC) 12/21/2017   Diverticulosis    Gout    Gram-negative bacteremia 05/11/2023   Heart murmur    History of osteomyelitis 05/31/2015   Hypercholesteremia     Hyperlipidemia    Hypertension    Hypotension due to hypovolemia 08/17/2022   Infection of left foot 08/19/2022   MSSA bacteremia 02/28/2023   Open wound of left foot with complication 05/10/2023   Osteomyelitis (HCC) 05/31/2015   Peripheral neuropathy    Postural dizziness with presyncope 02/26/2023   S/P BKA (below knee amputation) unilateral, 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 Past medical history notable for T2DM, peripheral neuropathy, CAD s/p CABG, HTN, afib (on eliquis ), PAD s/p stent, percutaneous angioplasty, and  TMA on right foot on 11/02/2023, and left BKA. Takes clopidogrel  and apixaban  at home. However, per med rec patient has not taken apixaban  in almost 72h. CHADSVASC score 4 (HTN, age+1, DM, CAD/PAD.) Hemoglobin at baseline consistent with patient's history. Platelets below historical baseline ~low 200s.  Baseline labs 9/13 Hgb: 10.5     Plt: 163        INR 1.2, aPTT 42 sec, HL 0.27  Goal of Therapy:  Heparin  level 0.3-0.7 units/ml aPTT 66-102 seconds Monitor platelets by anticoagulation protocol: Yes  9/14 @ 0054:  aPTT = 81,  HL = 0.35, therapeutic x 1(transitioning to HL dosing) 9/14 @ 0457: HL 0.39, therapeutic x 2 9/15 @ 0345: HL 0.23, SUBtherapeutic  9/15 @ 1255: HL 0.20, SUBtherapeutic 9/15 2229 HL 0.31, therapeutic x 1 9/16 0454 HL 0.35, therapeutic x 2   Plan:  Continue heparin  infusion rate at 1500 units/hour Recheck HL daily w/ AM labs while therapeutic  Continue to monitor H&H and platelets  Thank you for involving pharmacy in this patient's care.   Rankin CANDIE Dills, PharmD, [facility] 01/15/2024 5:58 AM

## 2024-01-15 NOTE — Discharge Instructions (Signed)
 Dressing changes right foot: Transmetatarsal amputation site: Begin daily dressing changes.  Cleanse incision site with Betadine .  Pat dry.  Apply bulky padded gauze dressing and gauze wrap.  Medial and lateral ankle ulcer as well as posterior heel ulcer: Remove padded foam dressing and apply new dressing 3 times a week.

## 2024-01-15 NOTE — Interval H&P Note (Signed)
 History and Physical Interval Note:  01/15/2024 4:02 PM  Andrew Harding Feeling  has presented today for surgery, with the diagnosis of Non Healing Surgical Wound..  The various methods of treatment have been discussed with the patient and family. After consideration of risks, benefits and other options for treatment, the patient has consented to  Procedure(s): Lower Extremity Angiography (Right) as a surgical intervention.  The patient's history has been reviewed, patient examined, no change in status, stable for surgery.  I have reviewed the patient's chart and labs.  Questions were answered to the patient's satisfaction.     Cordella Shawl

## 2024-01-15 NOTE — OR Nursing (Addendum)
 Pt verbalizing concern that his personal disposable razor (made by [employer]), shave cream,oothbrush and tooth paste were not with his belonging upon transfer from 108 to 204. I checked room 108 bathroom where he said he used it there last. It was not in there. Patient notified.   his clothes, prosthesis, cell phone, glasses and hospital hygiene items were brought to his room.

## 2024-01-15 NOTE — Progress Notes (Signed)
 Daily Progress Note   Subjective  - 4 Days Post-Op  Follow-up right foot debridement of bone.  Patient awaiting vascular procedure later today.  Objective Vitals:   01/14/24 1947 01/15/24 0204 01/15/24 0622 01/15/24 1015  BP: 106/61 108/66 105/64 (!) 108/58  Pulse: 84 69 71 (!) 56  Resp: 17 16 17 18   Temp: 98.4 F (36.9 C) 98.3 F (36.8 C) 97.9 F (36.6 C) 97.8 F (36.6 C)  TempSrc: Oral Oral Oral Oral  SpO2: 99% 98% 100% 92%  Weight:      Height:        Physical Exam: Bloody drainage and mild maceration to the distal lateral incision site.  No frank purulence.  Erythema is minimal.  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  Current culture shows similar infection.  Infectious disease has recommended 6 weeks of ertapenem .  PICC line is in place. Patient has decided on discharge to home.  He had a ramp placed to allow him to get in and out of his home without traversing stairs.  We discussed minimal weightbearing to his heel. He has a family friend who can perform dressing changes.  He will need at least initial daily dressings performed.  I will provide dressings in the discharge instructions but basically cleanse wound with Betadine  and cover with padded gauze dressing for the forefoot region.  He has pressure induced lesion to medial lateral ankle  as well as posterior heel and lateral foot in need of pressure reducing dressings that can be changed 3 times a week.   Home health for PICC line care, antibiotics and dressing care will be needed. He will need follow-up with me in about a week upon discharge.  He typically follows up on Thursdays with me.   [name] A  01/15/2024, 1:55 PM

## 2024-01-15 NOTE — Plan of Care (Signed)
  Problem: Education: Goal: Ability to describe self-care measures that may prevent or decrease complications (Diabetes Survival Skills Education) will improve Outcome: Progressing Goal: Individualized Educational Video(s) Outcome: Progressing   Problem: Coping: Goal: Ability to adjust to condition or change in health will improve Outcome: Progressing   Problem: Fluid Volume: Goal: Ability to maintain a balanced intake and output will improve Outcome: Progressing   Problem: Health Behavior/Discharge Planning: Goal: Ability to identify and utilize available resources and services will improve Outcome: Progressing Goal: Ability to manage health-related needs will improve Outcome: Progressing   Problem: Metabolic: Goal: Ability to maintain appropriate glucose levels will improve Outcome: Progressing   Problem: Nutritional: Goal: Maintenance of adequate nutrition will improve Outcome: Progressing Goal: Progress toward achieving an optimal weight will improve Outcome: Progressing   Problem: Skin Integrity: Goal: Risk for impaired skin integrity will decrease Outcome: Progressing   Problem: Tissue Perfusion: Goal: Adequacy of tissue perfusion will improve Outcome: Progressing   Problem: Health Behavior/Discharge Planning: Goal: Ability to manage health-related needs will improve Outcome: Progressing   Problem: Clinical Measurements: Goal: Ability to maintain clinical measurements within normal limits will improve Outcome: Progressing Goal: Will remain free from infection Outcome: Progressing Goal: Diagnostic test results will improve Outcome: Progressing Goal: Respiratory complications will improve Outcome: Progressing Goal: Cardiovascular complication will be avoided Outcome: Progressing   Problem: Activity: Goal: Risk for activity intolerance will decrease Outcome: Progressing   Problem: Nutrition: Goal: Adequate nutrition will be maintained Outcome:  Progressing   Problem: Coping: Goal: Level of anxiety will decrease Outcome: Progressing   Problem: Elimination: Goal: Will not experience complications related to bowel motility Outcome: Progressing Goal: Will not experience complications related to urinary retention Outcome: Progressing   Problem: Pain Managment: Goal: General experience of comfort will improve and/or be controlled Outcome: Progressing   Problem: Safety: Goal: Ability to remain free from injury will improve Outcome: Progressing   Problem: Skin Integrity: Goal: Risk for impaired skin integrity will decrease Outcome: Progressing   Problem: Activity: Goal: Ability to tolerate increased activity will improve Outcome: Progressing   Problem: Education: Goal: Knowledge of disease or condition will improve Outcome: Progressing Goal: Understanding of medication regimen will improve Outcome: Progressing Goal: Individualized Educational Video(s) Outcome: Progressing

## 2024-01-15 NOTE — TOC Progression Note (Signed)
 Transition of Care [facility]) - Progression Note    Patient Details  Name: Andrew Harding MRN: [id number] Date of Birth: 26-Sep-1950  [facility] of [facility]) CM/SW Contact  Andrew GORMAN Fuse, RN Phone Number: 01/15/2024, 3:13 PM  Clinical Narrative:     Patient received call from [facility] [phone] ext [phone], she is available to assist with dc planning. Patient has declined SNF. Patient will need long-term IV abx and wound care. TOC sent referral for IV abx to [facility], Holley Herring accepted the referral. Pam will be out to speak with the patient later tonight or tomorrow. The payor is Sherleen, there is only one HH agency INN. Referral for [facility] RN/PT/OT called to Leopoldo Cones accepted.                     Expected Discharge Plan and Services                                               Social Drivers of Health (SDOH) Interventions SDOH Screenings   Food Insecurity: No Food Insecurity (01/10/2024)  Housing: Low Risk  (01/10/2024)  Transportation Needs: No Transportation Needs (01/10/2024)  Utilities: Not At Risk (01/10/2024)  Depression (PHQ2-9): Low Risk  (05/03/2023)  Financial Resource Strain: Low Risk  (12/13/2022)   Received from [facility]  Social Connections: Moderately Isolated (01/10/2024)  Tobacco Use: Medium Risk (01/11/2024)    Readmission Risk Interventions    01/14/2024   10:52 AM 02/28/2023   12:53 PM  Readmission Risk Prevention Plan  Transportation Screening Complete Complete  PCP or Specialist Appt within 3-5 Days Complete Complete  HRI or Home Care Consult Complete Complete  Social Work Consult for Recovery Care Planning/Counseling Complete Complete  Palliative Care Screening Not Applicable Not Applicable  Medication Review Oceanographer) Complete Complete

## 2024-01-15 NOTE — Progress Notes (Signed)
 Date of Admission:  01/10/2024      ID: Andrew Harding is a 73 y.o. male  Principal Problem:   Foot infection Active Problems:   Benign essential hypertension   CKD stage 3a, GFR 45-59 ml/min (HCC)   Atrial fibrillation (HCC)   CAD S/P CABG x 4   Controlled type 2 diabetes mellitus with neuropathy (HCC)   S/P BKA (below knee amputation) unilateral, left (HCC)   PAD (peripheral artery disease) (HCC)   Osteomyelitis of foot, right, acute (HCC)    Subjective: Pt is feeling much better   Medications:   Chlorhexidine  Gluconate Cloth  6 each Topical Daily   cyanocobalamin   1,000 mcg Oral Daily   docusate sodium   100 mg Oral BID   gabapentin   300 mg Oral TID   insulin  aspart  0-9 Units Subcutaneous TID WC   insulin  glargine  28 Units Subcutaneous Daily   leptospermum manuka honey  1 Application Topical Daily   leptospermum manuka honey  1 Application Topical Daily   metoprolol  tartrate  12.5 mg Oral BID   metroNIDAZOLE   500 mg Oral Q12H   pneumococcal 20-valent conjugate vaccine  0.5 mL Intramuscular Tomorrow-1000   pravastatin   80 mg Oral QHS   sodium chloride  flush  10-40 mL Intracatheter Q12H   traZODone   50 mg Oral QHS    Objective: Vital signs in last 24 hours: Patient Vitals for the past 24 hrs:  BP Temp Temp src Pulse Resp SpO2  01/15/24 1448 (!) 141/67 -- -- 81 16 93 %  01/15/24 1400 (!) 99/59 97.8 F (36.6 C) Oral -- 16 --  01/15/24 1015 (!) 108/58 97.8 F (36.6 C) Oral (!) 56 18 92 %  01/15/24 0622 105/64 97.9 F (36.6 C) Oral 71 17 100 %  01/15/24 0204 108/66 98.3 F (36.8 C) Oral 69 16 98 %  01/14/24 1947 106/61 98.4 F (36.9 C) Oral 84 17 99 %  01/14/24 1757 117/66 97.8 F (36.6 C) Oral 81 18 100 %       PHYSICAL EXAM:  General: Alert, cooperative, no distress, appears stated age.  Heart: Regular rate and rhythm, no murmur, rub or gallop. Abdomen: Soft, non-tender,not distended. Bowel sounds normal. No masses Extremities:rt foot surgical dresisng  not removed Pictures reviewed    Rt PICc Left BKA Skin: No rashes or lesions. Or bruising Lymph: Cervical, supraclavicular normal. Neurologic: Grossly non-focal  Lab Results    Latest Ref Rng & Units 01/15/2024    4:54 AM 01/14/2024    3:45 AM 01/13/2024    4:57 AM  CBC  WBC 4.0 - 10.5 K/uL 4.8  7.4  6.1   Hemoglobin 13.0 - 17.0 g/dL 9.2  89.9  9.6   Hematocrit 39.0 - 52.0 % 28.8  31.3  29.7   Platelets 150 - 400 K/uL 131  145  129        Latest Ref Rng & Units 01/13/2024    4:57 AM 01/12/2024    4:20 AM 01/11/2024    5:07 AM  CMP  Glucose 70 - 99 mg/dL 884  870  834   BUN 8 - 23 mg/dL 14  13  13    Creatinine 0.61 - 1.24 mg/dL 8.73  8.75  8.68   Sodium 135 - 145 mmol/L 137  139  141   Potassium 3.5 - 5.1 mmol/L 3.6  3.6  3.7   Chloride 98 - 111 mmol/L 108  108  107   CO2 22 - 32  mmol/L 22  24  23    Calcium 8.9 - 10.3 mg/dL 8.4  8.5  8.5       Microbiology: WC sent BC NG    Assessment/Plan: Diabetic foot infection with peripheral artery disease involving the right foot at the site of prior TMA Patient underwent debridement with excision of the 4th and 5th metatarsal bones. Culture enterobacter cloacae resistant to oral antibiotic- pt is on cefepime -flagyl  added  On discharge will  change to IV ertapenem  for ease of administration as it is Q 24 compared to Q8 cefepime  Will need 6 weeks of IV antibiotic Vancomycin  discontinued  He has a complicated infectious history of the right foot His initial TMA was on 11/02/2023 and the culture then was MRSA and Enterococcus faecalis.  He had acute osteomyelitis of the bone and he was treated with 6 weeks of intravenous daptomycin  and completed around 12/21/2023 He then was on Levaquin and Augmentin  for Enterobacter cloacae and other organisms on 01/03/2024 repeat culture was done by Dr. Ashley and that had Enterobacter cloacae which is now resistant to oral medications  PAD Patient has undergone angio in July 2025 and also had  mechanical thrombectomy of the right anterior tibial artery, balloon angioplasty and stent placement.  He has also had stent placement in the right tibioperoneal trunk. Vascular has seen him this admission and they had ordered an ABI  History of left BKA  Diabetes mellitus  CAD status post CABG  A-fib well-controlled   Discussed the management with the patient and the hospitalist   OPAT orders   Diagnosis: Rt foot osteomyelitis Enterobacter cloacae DFI,  Baseline Creatinine 1.2  No Known Allergies  OPAT Orders Discharge antibiotics: Ertapenem  1 gram IV every 24 hours Duration: 6 weeks End Date: 02/21/24  [facility] Care Per Protocol:  Labs weekly while on IV antibiotics: _X_ CBC with differential  _X_ CMP _X_ CRP _X_ ESR   _X_ Please pull PIC at completion of IV antibiotics   Fax weekly lab results  promptly to [phone]  Clinic Follow Up Appt:01/31/24 at 10.45 Am with Dr.Kohen Reither   Call [phone] with any questions or critical value

## 2024-01-15 NOTE — Progress Notes (Signed)
 PHARMACY CONSULT NOTE FOR:  OUTPATIENT  PARENTERAL ANTIBIOTIC THERAPY (OPAT)  Indication: E cloacae diabetic foot infection and osteomyelitis Regimen: Ertapenem  1gm IV q24h End date: 02/21/2024  Labs - Once weekly:  CBC/D, CMP, ESR and CRP Fax weekly lab results  promptly to [phone]  Please pull PICC at completion of IV antibiotics Call [phone] with any questions or critical value  IV antibiotic discharge orders are pended. To discharging provider:  please sign these orders via discharge navigator,  Select New Orders & click on the button choice - Manage This Unsigned Work.     Thank you for allowing pharmacy to be a part of this patient's care.  Hanifa Antonetti, PharmD, BCPS, BCIDP Work Cell: [phone] 01/15/2024 10:04 AM

## 2024-01-15 NOTE — Progress Notes (Addendum)
 PROGRESS NOTE    Andrew Harding  FMW:[id number] DOB: 1950/06/18 DOA: 01/10/2024 PCP: Andrew Ophelia [name], MD     Brief Narrative:   From admission h and p  Andrew Harding Feeling is a 73 y.o. male with medical history significant for  DM II, peripheral neuropathy,CAD, s/p CABG, HTN, Atrial fibrillation with eliquis  for stroke prophylaxis, s/p right great toe amputation.  PAD s/p stent to rt ant tibial artery, s/p percutaneous angioplasty of the right mid popliteal artery and the right tibioperoneal trunk followed by TMA on the right on 11/02/23, and left BKA. Pathology from TMA was positive for osteomyelitis but margin was clear of osteo. Cultures from surgery grew out MRSA and enterococcus fecalis. He received treatment with 4 weeks of IV daptomycin  which was extended by two more weeks to complete 6 weeks of therapy on 12/14/2023.  The patient presented to podiatrists office on the morning of 01/10/1024 with complaints of worsening infection to his right foot. He was found to have drainage to the distal lateral aspect of the right foot from the most recent transmetatarsal amputation. MDRO enterobacter has been grown from this drainage. Susceptibilities demonstrated sensitivity to amikacin, aztreonam, cefepime , imipenem and meropenem . There was frank purulence from the wound today.    PICC line was placed prior to presentation in ED in anticipation of another course of outpatient antibiotics. He was sent to the ED for admission. I & D by podiatry has been scheduled for 01/11/2024.   The patient denies pain (he has severe neuropathy in that limb), fevers, chills, nausea, vomiting, diarrhea, constipation, cough, chest pain, neurological deficits, confusion, headaches.  Assessment & Plan:   Principal Problem:   Foot infection Active Problems:   Atrial fibrillation (HCC)   Benign essential hypertension   CKD stage 3a, GFR 45-59 ml/min (HCC)   CAD S/P CABG x 4   Controlled type 2 diabetes mellitus with neuropathy  (HCC)   S/P BKA (below knee amputation) unilateral, left (HCC)   PAD (peripheral artery disease) (HCC)   Osteomyelitis of foot, right, acute (HCC)   # Right foot osteomyelitis Complicated recent infectious course, see ID note for details. TMA performed 7/4, revision 8/22. 9/4 culture growing e faecalis, mdr. S/p excision of bone right 4th and 5th metatarsals 9/12 for ongoing osteo there with podiatry. Intra-op culture obtained, growing e faecalis, sensitive to cefepime  - continue cefepime , opat plan is ertapenem  for 6 weeks ending 10/23 (opat orders are in) - wound care per podiatry, appears to be healing appropriately  # Debility pt/ot advising snf but patient declines all bed offers so plan will be home with home health. Per PT, patient already has all the DME he will need. Home health orders have been placed (PT/OT/RN)  # PAD S/p thrombectomy, angioplasty, and stent placement right leg on 7/7 with dr. Marea. Vascular has been consulted to ensure patient is optimized. ABI shows signs mild/mod PAD - angiogram today - home statin - heparin  for home apixaban   # Chronic pain - home gabapentin   # CAD S/p cabg x4, asymptomatic - home metoprolol , statin - heparin   # HTN Bp well controlled - home metop  # A-fib, paroxysmal Rate controlled - home BB - heparin  for now  # T2DM Euglycemic - SSI  DVT prophylaxis: heparin  Code Status: full Family Communication: friend updated @ bedside 9/12  Level of care: Telemetry Surgical Status is: Inpatient Remains inpatient appropriate because: ongoing inpatinet evaluation and treatment    Consultants:  ID, podiatry  Procedures:  See above  Antimicrobials:  See above    Subjective: No complaints, tolerating diet, pain controlled, ready for procedure today  Objective: Vitals:   01/14/24 1947 01/15/24 0204 01/15/24 0622 01/15/24 1015  BP: 106/61 108/66 105/64 (!) 108/58  Pulse: 84 69 71 (!) 56  Resp: 17 16 17 18   Temp: 98.4  F (36.9 C) 98.3 F (36.8 C) 97.9 F (36.6 C) 97.8 F (36.6 C)  TempSrc: Oral Oral Oral Oral  SpO2: 99% 98% 100% 92%  Weight:      Height:        Intake/Output Summary (Last 24 hours) at 01/15/2024 1254 Last data filed at 01/14/2024 1944 Gross per 24 hour  Intake 1491.48 ml  Output --  Net 1491.48 ml    Filed Weights   01/10/24 1112  Weight: 82.6 kg    Examination:  General exam: Appears calm and comfortable  Respiratory system: Clear to auscultation. Respiratory effort normal. Cardiovascular system: S1 & S2 heard, RR  Gastrointestinal system: Abdomen is nondistended, soft and nontender.   Central nervous system: Alert and oriented. No focal neurological deficits. Extremities: Symmetric 5 x 5 power. Bandage over right foot. S/p left BKA Skin: bandage right foot Psychiatry: Judgement and insight appear normal. Mood & affect appropriate.     Data Reviewed: I have personally reviewed following labs and imaging studies  CBC: Recent Labs  Lab 01/10/24 1218 01/11/24 0507 01/12/24 0420 01/13/24 0457 01/14/24 0345 01/15/24 0454  WBC 6.4 7.3 8.9 6.1 7.4 4.8  NEUTROABS 4.5  --   --   --   --   --   HGB 11.6* 10.5* 10.5* 9.6* 10.0* 9.2*  HCT 36.5* 33.3* 32.6* 29.7* 31.3* 28.8*  MCV 89.7 89.3 88.3 87.6 88.2 88.9  PLT 193 165 163 129* 145* 131*   Basic Metabolic Panel: Recent Labs  Lab 01/10/24 1218 01/10/24 1247 01/11/24 0507 01/12/24 0420 01/13/24 0457  NA 140  --  141 139 137  K 4.1  --  3.7 3.6 3.6  CL 107  --  107 108 108  CO2 23  --  23 24 22   GLUCOSE 147*  --  165* 129* 115*  BUN 12  --  13 13 14   CREATININE 1.35*  --  1.31* 1.24 1.26*  CALCIUM 8.9  --  8.5* 8.5* 8.4*  MG  --  2.0  --   --   --    GFR: Estimated Creatinine Clearance: 60.1 mL/min (A) (by C-G formula based on SCr of 1.26 mg/dL (H)). Liver Function Tests: No results for input(s): AST, ALT, ALKPHOS, BILITOT, PROT, ALBUMIN in the last 168 hours. No results for input(s):  LIPASE, AMYLASE in the last 168 hours. No results for input(s): AMMONIA in the last 168 hours. Coagulation Profile: Recent Labs  Lab 01/12/24 1433  INR 1.2   Cardiac Enzymes: No results for input(s): CKTOTAL, CKMB, CKMBINDEX, TROPONINI in the last 168 hours. BNP (last 3 results) No results for input(s): PROBNP in the last 8760 hours. HbA1C: No results for input(s): HGBA1C in the last 72 hours. CBG: Recent Labs  Lab 01/14/24 1150 01/14/24 1549 01/14/24 1944 01/15/24 0731 01/15/24 1146  GLUCAP 121* 91 217* 96 105*   Lipid Profile: No results for input(s): CHOL, HDL, LDLCALC, TRIG, CHOLHDL, LDLDIRECT in the last 72 hours. Thyroid Function Tests: No results for input(s): TSH, T4TOTAL, FREET4, T3FREE, THYROIDAB in the last 72 hours. Anemia Panel: No results for input(s): VITAMINB12, FOLATE, FERRITIN, TIBC, IRON , RETICCTPCT in the last 72  hours. Urine analysis:    Component Value Date/Time   COLORURINE YELLOW (A) 05/10/2023 0937   APPEARANCEUR HAZY (A) 05/10/2023 0937   APPEARANCEUR Hazy (A) 05/30/2022 1026   LABSPEC 1.017 05/10/2023 0937   PHURINE 5.0 05/10/2023 0937   GLUCOSEU 50 (A) 05/10/2023 0937   HGBUR MODERATE (A) 05/10/2023 0937   BILIRUBINUR NEGATIVE 05/10/2023 0937   BILIRUBINUR Negative 05/30/2022 1026   KETONESUR NEGATIVE 05/10/2023 0937   PROTEINUR 100 (A) 05/10/2023 0937   UROBILINOGEN 0.2 04/05/2018 1042   NITRITE NEGATIVE 05/10/2023 0937   LEUKOCYTESUR NEGATIVE 05/10/2023 0937   Sepsis Labs: @LABRCNTIP (procalcitonin:4,lacticidven:4)  ) Recent Results (from the past 240 hours)  Culture, blood (Routine X 2) w Reflex to ID Panel     Status: None   Collection Time: 01/10/24 12:33 PM   Specimen: BLOOD  Result Value Ref Range Status   Specimen Description BLOOD RIGHT ANTECUBITAL  Final   Special Requests   Final    BOTTLES DRAWN AEROBIC AND ANAEROBIC Blood Culture results may not be optimal due to an  inadequate volume of blood received in culture bottles   Culture   Final    NO GROWTH 5 DAYS Performed at [facility], [address] [place], [place] [address]    Report Status 01/15/2024 FINAL  Final  Blood culture (single)     Status: None   Collection Time: 01/10/24  1:28 PM   Specimen: BLOOD  Result Value Ref Range Status   Specimen Description BLOOD BLOOD LEFT HAND  Final   Special Requests   Final    BOTTLES DRAWN AEROBIC AND ANAEROBIC Blood Culture adequate volume   Culture   Final    NO GROWTH 5 DAYS Performed at [facility], [address] [place], [place] [address]    Report Status 01/15/2024 FINAL  Final  Aerobic/Anaerobic Culture w Gram Stain (surgical/deep wound)     Status: None   Collection Time: 01/11/24 11:12 AM   Specimen: Foot, Right; Tissue  Result Value Ref Range Status   Specimen Description   Final    WOUND Performed at [facility], [address], [place], [place] [address]    Special Requests   Final    RIGHT FOOT TISSUE SWAB Performed at [facility], [address] [place], [place] [address]    Gram Stain NO WBC SEEN RARE GRAM NEGATIVE RODS   Final   Culture   Final    FEW ENTEROBACTER CLOACAE FEW PREVOTELLA MELANINOGENICA BETA LACTAMASE POSITIVE Performed at [facility], [address] [place], [place] [address]    Report Status 01/14/2024 FINAL  Final   Organism ID, Bacteria ENTEROBACTER CLOACAE  Final      Susceptibility   Enterobacter cloacae - MIC*    CEFEPIME  0.5 SENSITIVE Sensitive     GENTAMICIN  8 INTERMEDIATE Intermediate     MEROPENEM  <=0.25 SENSITIVE Sensitive     TRIMETH /SULFA  >=320 RESISTANT Resistant     PIP/TAZO Value in next row Intermediate ug/mL     64 INTERMEDIATEThis is a modified FDA-approved test that has been validated and its performance characteristics determined by the reporting laboratory.  This laboratory is certified under the Clinical Laboratory Improvement  Amendments CLIA as qualified to perform high complexity clinical laboratory testing.    * FEW ENTEROBACTER CLOACAE  Aerobic/Anaerobic Culture w Gram Stain (surgical/deep wound)     Status: None   Collection Time: 01/11/24 11:12 AM   Specimen: Foot, Right; Blood  Result Value Ref  Range Status   Specimen Description   Final    WOUND Performed at [facility], [address], [place], [place] [address]    Special Requests   Final    BONE CULTURE RIGHT FOOT CUP Performed at [facility], [address] [place], [place] [address]    Gram Stain   Final    RARE WBC PRESENT, PREDOMINANTLY PMN NO ORGANISMS SEEN    Culture   Final    RARE ENTEROBACTER CLOACAE CRITICAL RESULT CALLED TO, READ BACK BY AND VERIFIED WITH: RN DANIAL BEAL [phone] AT [phone], ADC FEW PREVOTELLA MELANINOGENICA BETA LACTAMASE POSITIVE Performed at [facility], [address], [place], [place] [address]    Report Status 01/14/2024 FINAL  Final   Organism ID, Bacteria ENTEROBACTER CLOACAE  Final      Susceptibility   Enterobacter cloacae - MIC*    CEFEPIME  0.5 SENSITIVE Sensitive     GENTAMICIN  8 INTERMEDIATE Intermediate     MEROPENEM  <=0.25 SENSITIVE Sensitive     TRIMETH /SULFA  >=320 RESISTANT Resistant     PIP/TAZO Value in next row Intermediate ug/mL     32 INTERMEDIATEThis is a modified FDA-approved test that has been validated and its performance characteristics determined by the reporting laboratory.  This laboratory is certified under the Clinical Laboratory Improvement Amendments CLIA as qualified to perform high complexity clinical laboratory testing.    * RARE ENTEROBACTER CLOACAE         Radiology Studies: No results found.       Scheduled Meds:  Chlorhexidine  Gluconate Cloth  6 each Topical Daily   cyanocobalamin   1,000 mcg Oral Daily   docusate sodium   100 mg Oral BID   gabapentin   300 mg Oral TID   insulin  aspart  0-9 Units Subcutaneous TID WC   insulin  glargine   28 Units Subcutaneous Daily   leptospermum manuka honey  1 Application Topical Daily   leptospermum manuka honey  1 Application Topical Daily   metoprolol  tartrate  12.5 mg Oral BID   metroNIDAZOLE   500 mg Oral Q12H   pneumococcal 20-valent conjugate vaccine  0.5 mL Intramuscular Tomorrow-1000   pravastatin   80 mg Oral QHS   sodium chloride  flush  10-40 mL Intracatheter Q12H   traZODone   50 mg Oral QHS   Continuous Infusions:  ceFEPime  (MAXIPIME ) IV 2 g (01/15/24 0529)   heparin  1,500 Units/hr (01/15/24 0233)     LOS: 5 days     Devaughn KATHEE Ban, MD [facility]   If 7PM-7AM, please contact night-coverage [email or web address] Password [facility] 01/15/2024, 12:54 PM

## 2024-01-15 NOTE — Op Note (Signed)
 [facility]  Percutaneous Study/Intervention Procedural Note   Date of Surgery: 01/15/2024  Surgeon:  Cordella Shawl  Pre-operative Diagnosis: Atherosclerotic occlusive disease bilateral lower extremities with right lower extremity with osteomyelitis of the right foot and ulceration.  Post-operative diagnosis:  Same  Procedure(s) Performed:             1.  Introduction catheter into right lower extremity 3rd order catheter placement               2.    Contrast injection right lower extremity for distal runoff             3.  Percutaneous transluminal angioplasty and stent placement right popliteal artery to 5 mm             4.  Percutaneous transluminal angioplasty and stent placement right anterior tibial in 2 locations             5.  Star close closure left common femoral arteriotomy  Anesthesia: Conscious sedation was administered under my direct supervision by the interventional radiology RN. IV Versed  plus fentanyl  were utilized. Continuous ECG, pulse oximetry and blood pressure was monitored throughout the entire procedure.  Conscious sedation was for a total of 85 minutes.  Sheath: 6 [place] Rabie left common femoral retrograde  Contrast: 80 cc  Fluoroscopy Time: 16.5 minutes  Indications:  Andrew Harding Feeling presents with worsening infection of the right lower extremity.  His foot ulcer has not healed and he has developed osteomyelitis of the 4th and 5th toes this suggests the patient is having limb threatening ischemia. The risks and benefits are reviewed all questions answered patient agrees to proceed.  Procedure:   Andrew Harding is a 73 y.o. y.o. male who was identified and appropriate procedural time out was performed.  The patient was then placed supine on the table and prepped and draped in the usual sterile fashion.    Ultrasound was placed in the sterile sleeve and the left groin was evaluated the left common femoral artery was echolucent and pulsatile  indicating patency.  Image was recorded for the permanent record and under real-time visualization a microneedle was inserted into the common femoral artery followed by the microwire and then the micro-sheath.  A J-wire was then advanced through the micro-sheath and a 6 French sheath was then inserted over a J-wire.  The pigtail catheter was then advanced to the distal aorta and AP projection of the aortic bifurcation and right iliac arteries was obtained.  Subsequently a pigtail catheter with an Advantage wire was used to cross the aortic bifurcation.  The catheter and wire were advanced down into the right distal external iliac artery. Oblique view of the femoral bifurcation was then obtained and subsequently the wire was reintroduced and the pigtail catheter negotiated into the SFA representing third order catheter placement.  Given the difficulties he has been having with wound healing after reviewing the study from July I had a high suspicion that the distal runoff did not well-characterized a lesion or 2 and therefore the distal runoff was then performed both LAO as well as RAO views and sometimes adding an AP view from the common femoral down to the ankle.  This did uncover 3 lesions that were hemodynamically significant to of which did not appear to have been this severe in the previous studies.  These were the mid popliteal lesion as well as the ostial lesion in the anterior tibial artery.  The critical  lesion in the mid anterior tibial artery appears to have been more of a hyperplastic response when compared to the previous study  6000 units of heparin  was then given and allowed to circulate for several minutes.  A 6 French 70 cm Rabie sheath was advanced up and over the bifurcation and positioned in the mid to distal superficial femoral artery  KMP catheter and advantage Glidewire were then negotiated down into the distal popliteal and then into the TP trunk and peroneal.  Magnified imaging of the mid  popliteal was then obtained and a 6 mm x 40 mm life stent was deployed across this high-grade lesion.  A 5 mm x 40 mm Lutonix drug-eluting balloon was used to angioplasty the mid popliteal and the newly placed stent.  The inflation was for 1 minute at 10 atm. Follow-up imaging demonstrated patency with less than 10% residual stenosis.  The detector was then positioned in a steep LAO projection and magnified imaging of the ostia of the anterior tibial was obtained. A 3.5 mm x 40 mm balloon was advanced into the origin of the anterior tibial.  Inflation was to 12 atmospheres for approximately 1 minute.  Next a 3.5 mm x 38 mm Esprit stent was advanced across the ostial lesion and deployed so that it is extending into the popliteal artery by 2 to 3 mm.  It was then postdilated with a 3.5 mm x 20 mm Jade balloon the initial inflation at the origin was to 20 atm for a maximal diameter of 3.7 mm the more distal portion of the stent was postdilated to 12 atm.  Follow-up imaging demonstrated patency with less than 10% residual stenosis. Distal runoff was then reassessed and the third lesion just distal to the previously placed Esprit stent in the anterior tibial was identified.  This was predilated with a 2.5 mm x 40 mm Jade balloon inflated to 14 atm.  Next a 2.5 mm x 38 mm Esprit stent was advanced across this lesion and deployed without difficulty.  It was postdilated with a 2.5 mm x 40 mm Jade balloon inflated to 14 to 16 atm.  Follow-up imaging demonstrated wide patency with less than 10% residual stenosis and preservation of the distal runoff into the dorsalis pedis and filling the pedal arch.  After review of these images the sheath is pulled into the left external iliac oblique of the common femoral is obtained and a Star close device deployed. There no immediate Complications.  Findings:  The distal abdominal aorta is opacified with a bolus injection contrast. The aorta itself has mild disease but no  hemodynamically significant lesions. The common and external iliac arteries are widely patent bilaterally.  The right common femoral is widely patent as is the profunda femoris.  The SFA widely patent.  Using multiple oblique views as well as AP views angiography did uncover 3 lesions that were hemodynamically significant to of which did not appear to have been this severe in the previous studies.  These were the mid popliteal lesion as well as the ostial lesion in the anterior tibial artery.  The critical lesion in the mid anterior tibial artery appears to have been more of a hyperplastic response when compared to the previous study.  The previously placed stent in the TP trunk is widely patent.  Peroneal is patent down to the ankle but does not seem to contribute to the foot much.  Posterior tibial is patent proximally but demonstrates diffuse disease which is rather extensive in  its midportion and then its distal one third is occluded and there is minimal if any reconstitution of the plantar arteries.  Following angioplasty and stent placement in 2 locations of the anterior tibial there now is in-line flow and looks quite nice with less than 10% residual stenosis. Angioplasty and stent placement of the mid popliteal yields an excellent result with less than 10% residual stenosis.  Summary: Successful recanalization right lower extremity for limb salvage                           Disposition: Patient was taken to the recovery room in stable condition having tolerated the procedure well.  Cordella Neko Boyajian 01/15/2024,5:36 PM

## 2024-01-15 NOTE — Consult Note (Signed)
 PHARMACY - ANTICOAGULATION CONSULT NOTE  Pharmacy Consult for heparin  Indication: atrial fibrillation  No Known Allergies  Patient Measurements: Height: 6' 3 (190.5 cm) Weight: 82.6 kg (182 lb) IBW/kg (Calculated) : 84.5 HEPARIN  DW (KG): 82.6  Vital Signs: Temp: 96.8 F (36 C) (09/16 1735) Temp Source: Temporal (09/16 1735) BP: 106/59 (09/16 1735) Pulse Rate: (P) 66 (09/16 1745)  Labs: Recent Labs    01/13/24 0054 01/13/24 0054 01/13/24 0457 01/14/24 0345 01/14/24 1255 01/14/24 2229 01/15/24 0454  HGB  --    < > 9.6* 10.0*  --   --  9.2*  HCT  --   --  29.7* 31.3*  --   --  28.8*  PLT  --   --  129* 145*  --   --  131*  APTT 81*  --   --   --   --   --   --   HEPARINUNFRC 0.35  --  0.39 0.23* 0.20* 0.31 0.35  CREATININE  --   --  1.26*  --   --   --   --    < > = values in this interval not displayed.    Estimated Creatinine Clearance: 60.1 mL/min (A) (by C-G formula based on SCr of 1.26 mg/dL (H)).   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) 09/03/2023   Atrial fibrillation with RVR (HCC) 08/19/2022   Bladder neck obstruction    Cellulitis 08/17/2022   Chronic kidney disease    Coronary artery disease    a.) s/p 4v CABG in 2014   Diabetes mellitus without complication (HCC)    Diabetic neuropathy (HCC)    Diabetic peripheral neuropathy (HCC)    Diabetic ulcer of toe of left foot associated with diabetes mellitus of other type, limited to breakdown of skin (HCC) 12/21/2017   Diverticulosis    Gout    Gram-negative bacteremia 05/11/2023   Heart murmur    History of osteomyelitis 05/31/2015   Hypercholesteremia    Hyperlipidemia    Hypertension    Hypotension due to hypovolemia 08/17/2022   Infection of left foot 08/19/2022   MSSA bacteremia 02/28/2023    Open wound of left foot with complication 05/10/2023   Osteomyelitis (HCC) 05/31/2015   Peripheral neuropathy    Postural dizziness with presyncope 02/26/2023   S/P BKA (below knee amputation) unilateral, 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 Past medical history notable for T2DM, peripheral neuropathy, CAD s/p CABG, HTN, afib (on eliquis ), PAD s/p stent, percutaneous angioplasty, and TMA on right foot on 11/02/2023, and left BKA. Takes clopidogrel  and apixaban  at home. However, per med rec patient has not taken apixaban  in almost 72h. CHADSVASC score 4 (HTN, age+1, DM, CAD/PAD.) Hemoglobin at baseline consistent with patient's history. Platelets below historical baseline ~low 200s.  Baseline labs 9/13 Hgb: 10.5     Plt: 163  INR 1.2, aPTT 42 sec, HL 0.27  Goal of Therapy:  Heparin  level 0.3-0.7 units/ml aPTT 66-102 seconds Monitor platelets by anticoagulation protocol: Yes  9/14 @ 0054:  aPTT = 81,  HL = 0.35, therapeutic x 1(transitioning to HL dosing) 9/14 @ 0457: HL 0.39, therapeutic x 2 9/15 @ 0345: HL 0.23, SUBtherapeutic  9/15 @ 1255: HL 0.20, SUBtherapeutic 9/15 2229 HL 0.31, therapeutic x 1 9/16 0454 HL 0.35, therapeutic x 2   Plan:  Per MD: Restart the heparin  at 6 PM today January 15, 2024.  Restart at the previous rate no initial bolus but okay to follow the nomogram thereafter  Will restart heparin  infusion at a rate of 1500 units/hour Check heparin  level in 8 hours Monitor CBC daily while on heparin    Thank you for involving pharmacy in this patient's care.   Lum Mania,  PharmD 01/15/2024 6:02 PM

## 2024-01-15 NOTE — OR Nursing (Signed)
 Assisted to bedside commode voided and had BM.

## 2024-01-16 ENCOUNTER — Other Ambulatory Visit: Payer: Self-pay

## 2024-01-16 ENCOUNTER — Encounter: Payer: Self-pay | Admitting: Infectious Diseases

## 2024-01-16 ENCOUNTER — Encounter: Payer: Self-pay | Admitting: Vascular Surgery

## 2024-01-16 DIAGNOSIS — E11628 Type 2 diabetes mellitus with other skin complications: Secondary | ICD-10-CM

## 2024-01-16 DIAGNOSIS — L089 Local infection of the skin and subcutaneous tissue, unspecified: Secondary | ICD-10-CM | POA: Diagnosis not present

## 2024-01-16 DIAGNOSIS — A498 Other bacterial infections of unspecified site: Secondary | ICD-10-CM

## 2024-01-16 LAB — GLUCOSE, CAPILLARY
Glucose-Capillary: 109 mg/dL — ABNORMAL HIGH (ref 70–99)
Glucose-Capillary: 157 mg/dL — ABNORMAL HIGH (ref 70–99)

## 2024-01-16 LAB — CBC
HCT: 27.9 % — ABNORMAL LOW (ref 39.0–52.0)
Hemoglobin: 8.9 g/dL — ABNORMAL LOW (ref 13.0–17.0)
MCH: 28.2 pg (ref 26.0–34.0)
MCHC: 31.9 g/dL (ref 30.0–36.0)
MCV: 88.3 fL (ref 80.0–100.0)
Platelets: 121 K/uL — ABNORMAL LOW (ref 150–400)
RBC: 3.16 MIL/uL — ABNORMAL LOW (ref 4.22–5.81)
RDW: 13.3 % (ref 11.5–15.5)
WBC: 3.7 K/uL — ABNORMAL LOW (ref 4.0–10.5)
nRBC: 0 % (ref 0.0–0.2)

## 2024-01-16 LAB — HEPARIN LEVEL (UNFRACTIONATED): Heparin Unfractionated: 0.35 [IU]/mL (ref 0.30–0.70)

## 2024-01-16 MED ORDER — ERTAPENEM IV (FOR PTA / DISCHARGE USE ONLY): 1.0000 g | INTRAVENOUS | 0 refills | Status: AC

## 2024-01-16 MED ORDER — ASPIRIN 81 MG PO TBEC
81.0000 mg | DELAYED_RELEASE_TABLET | Freq: Every day | ORAL | 1 refills | Status: DC
  Filled 2024-01-16: qty 90, 90d supply, fill #0

## 2024-01-16 MED ORDER — ACETAMINOPHEN 325 MG PO TABS
650.0000 mg | ORAL_TABLET | Freq: Four times a day (QID) | ORAL | 0 refills | Status: DC | PRN
  Filled 2024-01-16: qty 30, 4d supply, fill #0

## 2024-01-16 MED ORDER — APIXABAN 5 MG PO TABS
5.0000 mg | ORAL_TABLET | Freq: Two times a day (BID) | ORAL | Status: DC
  Filled 2024-01-16: qty 1

## 2024-01-16 MED ORDER — CLOPIDOGREL BISULFATE 75 MG PO TABS
75.0000 mg | ORAL_TABLET | Freq: Every day | ORAL | Status: DC
  Administered 2024-01-16: 75 mg via ORAL
  Filled 2024-01-16: qty 1

## 2024-01-16 MED ORDER — METOPROLOL TARTRATE 25 MG PO TABS: 12.5000 mg | ORAL_TABLET | Freq: Two times a day (BID) | ORAL | Status: AC

## 2024-01-16 MED ORDER — ASPIRIN 81 MG PO TBEC
81.0000 mg | DELAYED_RELEASE_TABLET | Freq: Every day | ORAL | Status: DC
Start: 2024-01-16 — End: 2024-01-16
  Administered 2024-01-16: 81 mg via ORAL
  Filled 2024-01-16: qty 1

## 2024-01-16 NOTE — Treatment Plan (Signed)
 OPAT orders   Diagnosis: Rt foot osteomyelitis Enterobacter cloacae DFI,  Baseline Creatinine 1.2  No Known Allergies  OPAT Orders Discharge antibiotics: Ertapenem  1 gram IV every 24 hours Duration: 6 weeks End Date: 02/21/24  [facility] Care Per Protocol:  Labs weekly while on IV antibiotics: _X_ CBC with differential  _X_ CMP _X_ CRP _X_ ESR   _X_ Please pull PIC at completion of IV antibiotics   Fax weekly lab results  promptly to [phone]  Clinic Follow Up Appt:01/31/24 at 10.45 Am with Dr.Virginia Francisco   Call [phone] with any questions or critical value

## 2024-01-16 NOTE — Progress Notes (Signed)
 Progress Note    01/16/2024 9:54 AM 1 Day Post-Op  Subjective:  Andrew Harding is a 73 yo male now POD #1 from:   Procedure(s) Performed:             1.  Introduction catheter into right lower extremity 3rd order catheter placement               2.    Contrast injection right lower extremity for distal runoff             3.  Percutaneous transluminal angioplasty and stent placement right popliteal artery to 5 mm             4.  Percutaneous transluminal angioplasty and stent placement right anterior tibial in 2 locations             5.  Star close closure left common femoral arteriotomy  Patient is resting comfortably in bed this morning.  States that his lower extremity feels better and warmer this morning.  No complaints overnight.  Patient remains on heparin  infusion this morning.  Vitals are remained stable. Vitals:   01/16/24 0708 01/16/24 0918  BP: 101/65 (!) 104/55  Pulse: 80 88  Resp: 17   Temp: 97.7 F (36.5 C)   SpO2: 98% 98%   Physical Exam: Cardiac:  RRR, Normal S1, S2. No murmurs. Lungs:  Clear on auscultation throughout, No rales, rhonchi or wheezing. Non labored breathing.  Incisions:  Right foot. Dressing clean dry and intact.  Extremities:  Left BKA, Right lower extremity with TMA. Warm and positive pulses.  Abdomen:  Positive bowel sounds throughout. Soft, non tender and non distended.  Neurologic: AAOX3, Answers questions and follows commands.   CBC    Component Value Date/Time   WBC 3.7 (L) 01/16/2024 0311   RBC 3.16 (L) 01/16/2024 0311   HGB 8.9 (L) 01/16/2024 0311   HCT 27.9 (L) 01/16/2024 0311   PLT 121 (L) 01/16/2024 0311   MCV 88.3 01/16/2024 0311   MCH 28.2 01/16/2024 0311   MCHC 31.9 01/16/2024 0311   RDW 13.3 01/16/2024 0311   LYMPHSABS 1.2 01/10/2024 1218   MONOABS 0.4 01/10/2024 1218   EOSABS 0.3 01/10/2024 1218   BASOSABS 0.1 01/10/2024 1218    BMET    Component Value Date/Time   NA 137 01/13/2024 0457   K 3.6 01/13/2024 0457   CL  108 01/13/2024 0457   CO2 22 01/13/2024 0457   GLUCOSE 115 (H) 01/13/2024 0457   GLUCOSE 402 (H) 09/17/2012 1417   BUN 14 01/13/2024 0457   CREATININE 1.26 (H) 01/13/2024 0457   CALCIUM 8.4 (L) 01/13/2024 0457   GFRNONAA 60 (L) 01/13/2024 0457   GFRAA 58 (L) 06/02/2015 0656    INR    Component Value Date/Time   INR 1.2 01/12/2024 1433     Intake/Output Summary (Last 24 hours) at 01/16/2024 0954 Last data filed at 01/16/2024 0227 Gross per 24 hour  Intake 1359.87 ml  Output 400 ml  Net 959.87 ml     Assessment/Plan:  73 y.o. male is s/p SEE ABOVE  1 Day Post-Op   PLAN Discontinue IV heparin  infusion. Restart patient home ASA 81 mg daily at Eliquis  5 mg twice daily. Okay per vascular surgery for patient to discharge home Follow-up with infectious disease as scheduled due to home IV antibiotics. Follow-up with podiatry as scheduled Follow-up with vein and vascular services in 4 weeks with right lower extremity arterial duplex ultrasound with ABI.  DVT prophylaxis:  ASA 81 mg daily, Eliquis  5 mg twice daily.   [name] [facility] 01/16/2024 9:54 AM

## 2024-01-16 NOTE — Progress Notes (Signed)
 Nurse Manager had a discussion with the patient and family friend. We discussed is missing razor and how his room was taken away from him while his was at a procedure. His belonging was left in the bathroom which was his razor, toothbrush, toothpaste, and shaving cream. He has all the proper equipment at home. He needs to arrange someone to get food for him because he lives alone. Also, he is not sure if home health or a family friend will be giving him is antibiotics. Also, that they will have his antibiotics when he gets home. He would like to be discharged in the morning to make sure everything is in place. No one has talked to him.

## 2024-01-16 NOTE — TOC Progression Note (Addendum)
 Transition of Care [facility]) - Progression Note    Patient Details  Name: Andrew Harding MRN: [id number] Date of Birth: 04-14-1951  [facility] of [facility]) CM/SW Contact  [place] L Pietro [place], [place] Phone Number: 01/16/2024, 12:11 PM  Clinical Narrative:     CSW spoke with Holley Herring regarding home infusions. Pam advised that she came by [facility] yesterday to teach and do education with patient but he was not in the room. She stated that she will be able to come by today to provide education. CSW advised that discharge is planned for today.   Home Health Services in place with [facility]. SOC will begin up to 48 hours after discharge.       1:44pm: CSW spoke with patient. Patient eager to discharge. CSW advised patient that he is unable to discharge until he is taught how to administer his antibiotics. CSW clarified that patient will need to give himself antibiotics or have the person who will be administering at the hospital so that they can be taught.   Patient advised that he lives alone. He stated that it is short notice. CSW advised that patient has been informed that he would have to administer his antibiotics at home. CSW advised that if he unable to, he will most likely need to discharge to a SNF.                Expected Discharge Plan and Services                                               Social Drivers of Health (SDOH) Interventions SDOH Screenings   Food Insecurity: No Food Insecurity (01/10/2024)  Housing: Low Risk  (01/10/2024)  Transportation Needs: No Transportation Needs (01/10/2024)  Utilities: Not At Risk (01/10/2024)  Depression (PHQ2-9): Low Risk  (05/03/2023)  Financial Resource Strain: Low Risk  (12/13/2022)   Received from [facility]  Social Connections: Moderately Isolated (01/10/2024)  Tobacco Use: Medium Risk (01/11/2024)    Readmission Risk Interventions    01/14/2024   10:52 AM 02/28/2023   12:53 PM  Readmission Risk Prevention  Plan  Transportation Screening Complete Complete  PCP or Specialist Appt within 3-5 Days Complete Complete  HRI or Home Care Consult Complete Complete  Social Work Consult for Recovery Care Planning/Counseling Complete Complete  Palliative Care Screening Not Applicable Not Applicable  Medication Review Oceanographer) Complete Complete

## 2024-01-16 NOTE — Consult Note (Signed)
 PHARMACY - ANTICOAGULATION CONSULT NOTE  Pharmacy Consult for heparin  Indication: atrial fibrillation  No Known Allergies  Patient Measurements: Height: 6' 3 (190.5 cm) Weight: 82.6 kg (182 lb) IBW/kg (Calculated) : 84.5 HEPARIN  DW (KG): 82.6  Vital Signs: Temp: 98.8 F (37.1 C) (09/17 0338) Temp Source: Oral (09/17 0338) BP: 100/63 (09/17 0338) Pulse Rate: 74 (09/17 0338)  Labs: Recent Labs    01/13/24 0457 01/14/24 0345 01/14/24 1255 01/14/24 2229 01/15/24 0454 01/16/24 0311  HGB 9.6* 10.0*  --   --  9.2* 8.9*  HCT 29.7* 31.3*  --   --  28.8* 27.9*  PLT 129* 145*  --   --  131* 121*  HEPARINUNFRC 0.39 0.23*   < > 0.31 0.35 0.35  CREATININE 1.26*  --   --   --   --   --    < > = values in this interval not displayed.    Estimated Creatinine Clearance: 60.1 mL/min (A) (by C-G formula based on SCr of 1.26 mg/dL (H)).   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) 09/03/2023   Atrial fibrillation with RVR (HCC) 08/19/2022   Bladder neck obstruction    Cellulitis 08/17/2022   Chronic kidney disease    Coronary artery disease    a.) s/p 4v CABG in 2014   Diabetes mellitus without complication (HCC)    Diabetic neuropathy (HCC)    Diabetic peripheral neuropathy (HCC)    Diabetic ulcer of toe of left foot associated with diabetes mellitus of other type, limited to breakdown of skin (HCC) 12/21/2017   Diverticulosis    Gout    Gram-negative bacteremia 05/11/2023   Heart murmur    History of osteomyelitis 05/31/2015   Hypercholesteremia    Hyperlipidemia    Hypertension    Hypotension due to hypovolemia 08/17/2022   Infection of left foot 08/19/2022   MSSA bacteremia 02/28/2023   Open wound of left foot with complication 05/10/2023   Osteomyelitis (HCC) 05/31/2015    Peripheral neuropathy    Postural dizziness with presyncope 02/26/2023   S/P BKA (below knee amputation) unilateral, 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 Past medical history notable for T2DM, peripheral neuropathy, CAD s/p CABG, HTN, afib (on eliquis ), PAD s/p stent, percutaneous angioplasty, and TMA on right foot on 11/02/2023, and left BKA. Takes clopidogrel  and apixaban  at home. However, per med rec patient has not taken apixaban  in almost 72h. CHADSVASC score 4 (HTN, age+1, DM, CAD/PAD.) Hemoglobin at baseline consistent with patient's history. Platelets below historical baseline ~low 200s.  Baseline labs 9/13 Hgb: 10.5     Plt: 163        INR 1.2, aPTT 42 sec, HL 0.27  Goal of Therapy:  Heparin  level 0.3-0.7 units/ml aPTT 66-102 seconds Monitor platelets by anticoagulation protocol: Yes  9/14 @ 0054:  aPTT = 81,  HL = 0.35, therapeutic x 1(transitioning  to HL dosing) 9/14 @ 0457: HL 0.39, therapeutic x 2 9/15 @ 0345: HL 0.23, SUBtherapeutic  9/15 @ 1255: HL 0.20, SUBtherapeutic 9/15 2229 HL 0.31, therapeutic x 1 9/16 0454 HL 0.35, therapeutic x 2 9/17 0311 HL 0.35, therapeutic x 1 after restart   Plan:   Will continue heparin  infusion at a rate of 1500 units/hour Recheck heparin  level in 8 hours to confirm Monitor CBC daily while on heparin    Thank you for involving pharmacy in this patient's care.   Lum Mania, PharmD 01/16/2024 3:47 AM

## 2024-01-16 NOTE — Discharge Summary (Addendum)
 Physician Discharge Summary   [name] MRN: [id number] DOB: 20-Jan-1951  Admit date:     01/10/2024  Discharge date: 01/16/24  Discharge Physician: Laree Lock   PCP: Fernande Ophelia [name], MD   Recommendations at discharge:  Follow up with PCP within 1-2 weeks - Repeat BMP, CBC  Follow-up with ID outpatient Follow-up with vascular surgery outpatient Follow-up with podiatry outpatient in a week  Home PT/OT/RN/infusions set up  Discharge Diagnoses: Principal Problem:   Foot infection Active Problems:   Atrial fibrillation (HCC)   Benign essential hypertension   CKD stage 3a, GFR 45-59 ml/min (HCC)   CAD S/P CABG x 4   Controlled type 2 diabetes mellitus with neuropathy (HCC)   S/P BKA (below knee amputation) unilateral, left (HCC)   PAD (peripheral artery disease) (HCC)   Osteomyelitis of foot, right, acute [facility]   Hospital Course: Andrew Harding is a 73 y.o. male with medical history significant for  DM II, peripheral neuropathy,CAD, s/p CABG, HTN, Atrial fibrillation with eliquis  for stroke prophylaxis, s/p right great toe amputation.  PAD s/p stent to rt ant tibial artery, s/p percutaneous angioplasty of the right mid popliteal artery and the right tibioperoneal trunk followed by TMA on the right on 11/02/23, and left BKA. Pathology from TMA was positive for osteomyelitis but margin was clear of osteo. Cultures from surgery grew out MRSA and enterococcus fecalis. He received treatment with 4 weeks of IV daptomycin  which was extended by two more weeks to complete 6 weeks of therapy on 12/14/2023.  The patient presented to podiatrists office on the morning of 01/10/1024 with complaints of worsening infection to his right foot. He was found to have drainage to the distal lateral aspect of the right foot from the most recent transmetatarsal amputation. MDRO enterobacter has been grown from this drainage. Susceptibilities demonstrated sensitivity to amikacin, aztreonam, cefepime ,  imipenem and meropenem . There was frank purulence from the wound.   PICC line was placed prior to presentation in ED in anticipation of another course of outpatient antibiotics. He was sent to the ED for admission. I & D by podiatry has been scheduled for 01/11/2024.   Hospital course as below  Assessment and Plan: Right foot osteomyelitis Complicated recent infectious course, see ID note for details. TMA performed 7/4, revision 8/22. 9/4 culture growing e faecalis, mdr. S/p excision of bone right 4th and 5th metatarsals 9/12 for ongoing osteo there with podiatry. Intra-op culture obtained, growing Enterobacter cloacae - was on IV cefepime , discharge on opat plan IV ertapenem  for 6 weeks ending 10/23 - wound care per podiatry, appears to be healing appropriately -Teaching for home infusions done -Follow-up with podiatry and ID outpatient   Debility pt/ot advising snf but patient declines all bed offers so plan will be home with home health. Per PT, patient already has all the DME he will need. Home health orders have been placed (PT/OT/RN)    PAD S/p thrombectomy, angioplasty, and stent placement right leg on 7/7 with dr. Marea. Vascular has been consulted to ensure patient is optimized. ABI shows signs mild/mod PAD - s/p angiogram 09/16 successful recanalization right lower extremity for limb salvage - home statin - Discharge on Eliquis , aspirin .  Discontinue Plavix  (discussed with vascular surgery)    CAD S/p cabg x4, asymptomatic - home metoprolol , statin   A-fib, paroxysmal Rate controlled - home BB, Eliquis   Pancytopenia B12 def - repeat CBC outpatient in 1 week - on daily supplements    Consultants:  Infectious disease, vascular surgery, podiatry Disposition: Home health Diet recommendation:  Discharge Diet Orders (From admission, onward)     Start     Ordered   01/16/24 0000  Diet - low sodium heart healthy        01/16/24 1315            DISCHARGE  MEDICATION: Allergies as of 01/16/2024   No Known Allergies      Medication List     STOP taking these medications    amoxicillin -clavulanate 875-125 MG tablet Commonly known as: AUGMENTIN    clopidogrel  75 MG tablet Commonly known as: PLAVIX    oxyCODONE  5 MG immediate release tablet Commonly known as: Oxy IR/ROXICODONE    Ozempic (0.25 or 0.5 MG/DOSE) 2 MG/3ML Sopn Generic drug: Semaglutide(0.25 or 0.5MG /DOS)   Ozempic (1 MG/DOSE) 4 MG/3ML Sopn Generic drug: Semaglutide (1 MG/DOSE)       TAKE these medications    acetaminophen  325 MG tablet Commonly known as: TYLENOL  Take 2 tablets (650 mg total) by mouth every 6 (six) hours as needed for mild pain (pain score 1-3) or fever (or Fever >/= 101).   aspirin  EC 81 MG tablet Take 1 tablet (81 mg total) by mouth daily. Swallow whole. Start taking on: January 17, 2024   chlorhexidine  4 % external liquid Commonly known as: HIBICLENS  Apply 15 mLs (1 Application total) topically as directed for 30 doses. Use as directed daily for 5 days every other week for 6 weeks.   chlorhexidine  4 % external liquid Commonly known as: HIBICLENS  Apply 15 mLs (1 Application total) topically as directed for 30 doses. Use as directed daily for 5 days every other week for 6 weeks.   cyanocobalamin  1000 MCG tablet Commonly known as: VITAMIN B12 Take 1 tablet (1,000 mcg total) by mouth daily.   docusate sodium  100 MG capsule Commonly known as: COLACE Take 1 capsule (100 mg total) by mouth 2 (two) times daily.   Eliquis  5 MG Tabs tablet Generic drug: apixaban  TAKE 1 TABLET(5 MG) BY MOUTH TWICE DAILY   ertapenem  IVPB Commonly known as: INVANZ  Inject 1 g into the vein daily. Indication:  E cloacae diabetic foot infection and osteomyelitis First Dose: Yes Last Day of Therapy:  02/21/2024 Labs - Once weekly:  CBC/D, CMP, ESR and CRP Fax weekly lab results  promptly to [phone]  Method of administration: Mini-Bag Plus /  Gravity Method of administration may be changed at the discretion of home infusion pharmacist based upon assessment of the patient and/or caregiver's ability to self-administer the medication ordered. Please pull PICC at completion of IV antibiotics Call [phone] with any questions or critical value   Fifty50 Pen Needles 32G X 4 MM Misc Generic drug: Insulin  Pen Needle USE 4 TIMES DAILY   FreeStyle Libre 2 Sensor Misc   gabapentin  300 MG capsule Commonly known as: NEURONTIN  Take 1 capsule (300 mg total) by mouth 3 (three) times daily.   insulin  glargine 100 UNIT/ML Solostar Pen Commonly known as: LANTUS  Inject 28 Units into the skin at bedtime. What changed: when to take this   iron  polysaccharides 150 MG capsule Commonly known as: NIFEREX Take 1 capsule (150 mg total) by mouth daily.   metFORMIN  500 MG 24 hr tablet Commonly known as: GLUCOPHAGE -XR Take 1,000 mg by mouth 2 (two) times daily with a meal.   metoprolol  tartrate 25 MG tablet Commonly known as: LOPRESSOR  Take 0.5 tablets (12.5 mg total) by mouth 2 (two) times daily.   mupirocin  ointment 2 %  Commonly known as: BACTROBAN  Place 1 Application into the nose 2 (two) times daily for 60 doses. Use as directed 2 times daily for 5 days every other week for 6 weeks.   OneTouch Ultra test strip Generic drug: glucose blood Use 3 (three) times daily   pravastatin  80 MG tablet Commonly known as: PRAVACHOL  Take 80 mg by mouth daily.   traZODone  50 MG tablet Commonly known as: DESYREL  Take 50 mg by mouth at bedtime.   Trulicity  0.75 MG/0.5ML Soaj Generic drug: Dulaglutide  Inject 0.75 mg into the skin once a week. Every Monday               Discharge Care Instructions  (From admission, onward)           Start     Ordered   01/16/24 0000  Change dressing on IV access line weekly and PRN  (Home infusion instructions - [facility] )        01/16/24 1303   01/16/24 0000  Discharge wound care:        Comments: Cleanse with saline, pat dry the peri-wound skin. Apply Medihoney to the wound beds (dehiscence and malleolus), cover with non adherent silicone Mepitel [phone]. wrap with Kerlix and ACE wrap. Change Daily or PRN   01/16/24 1315            Follow-up Information     Fernande Ophelia [name], MD Follow up.   Specialty: Internal Medicine Why: hospital follow up Contact information: [address] [facility] [place] [address] [phone]         [name], DPM Follow up on 01/24/2024.   Specialty: Podiatry Contact information: [address] [place] [address] [phone]         Jama, Cordella MATSU, MD Follow up in 4 week(s).   Specialties: Vascular Surgery, Cardiology, Radiology, Vascular Surgery Why: Right lower extremity Arterial duplex U/S with ABI Contact information: [address] [place] [address] [phone]                Discharge Exam: Fredricka Weights   01/10/24 1112  Weight: 82.6 kg    General exam: Appears calm and comfortable  Respiratory system: Clear to auscultation. Respiratory effort normal. Cardiovascular system: S1 & S2 heard, RR  Gastrointestinal system: Abdomen is nondistended, soft and nontender.   Central nervous system: Alert and oriented. No focal neurological deficits. Extremities: Symmetric 5 x 5 power. Bandage over right foot. S/p left BKA Skin: bandage right foot Psychiatry: Judgement and insight appear normal. Mood & affect appropriate  Condition at discharge: good  The results of significant diagnostics from this hospitalization (including imaging, microbiology, ancillary and laboratory) are listed below for reference.   Imaging Studies: PERIPHERAL VASCULAR CATHETERIZATION Result Date: 01/15/2024 See surgical note for result.  DG Chest Port 1 View Result Date: 01/13/2024 CLINICAL DATA:  73 year old male PICC line placement. EXAM: PORTABLE CHEST 1 VIEW  COMPARISON:  Portable chest 11/07/2023 and earlier. FINDINGS: Portable AP semi upright view at 0839 hours. Right upper extremity PICC line has been placed, tip projects about 8 mm below the expected cavoatrial junction level, although lower lung volumes on this view also. Prior sternotomy, CABG. Normal cardiac size and mediastinal contours. No pneumothorax. Allowing for portable technique the lungs are clear. Visualized tracheal air column is within normal limits. No acute osseous abnormality identified. Paucity of visible bowel gas. IMPRESSION: 1. Right upper extremity PICC line placed with satisfactory tip placement -  near the cavoatrial junction - considering somewhat low lung volumes. 2. No acute cardiopulmonary abnormality. Electronically Signed   By: VEAR Hurst M.D.   On: 01/13/2024 08:54   US  ARTERIAL ABI (SCREENING LOWER EXTREMITY) Result Date: 01/12/2024 CLINICAL DATA:  Delayed wound healing EXAM: NONINVASIVE PHYSIOLOGIC VASCULAR STUDY OF BILATERAL LOWER EXTREMITIES TECHNIQUE: Evaluation of both lower extremities were performed at rest, including calculation of ankle-brachial indices with single level Doppler, pressure and pulse volume recording. COMPARISON:  None Available. FINDINGS: Right ABI:  1.89 Right Lower Extremity: Abnormal biphasic arterial waveforms at the ankle. > 1.4 Non diagnostic secondary to incompressible vessel calcifications (medial arterial sclerosis of [place]) IMPRESSION: Nondiagnostic right ankle-brachial index due to non compressibility of the arteries at the ankle. This is often seen in the setting of chronic medial arteriosclerosis. However, the arterial waveforms are abnormal in monophasic indicating potentially occlusive proximal peripheral arterial disease. If further imaging is clinically warranted, CTA of the aorta with lower extremity runoff could further evaluate. Electronically Signed   By: Wilkie Lent M.D.   On: 01/12/2024 08:02   US  EKG SITE RITE Result Date:  01/11/2024 If Site Rite image not attached, placement could not be confirmed due to current cardiac rhythm.  DG MINI C-ARM IMAGE ONLY Result Date: 01/11/2024 There is no interpretation for this exam.  This order is for images obtained during a surgical procedure.  Please See Surgeries Tab for more information regarding the procedure.   DG MINI C-ARM IMAGE ONLY Result Date: 12/21/2023 There is no interpretation for this exam.  This order is for images obtained during a surgical procedure.  Please See Surgeries Tab for more information regarding the procedure.    Microbiology: Results for orders placed or performed during the hospital encounter of 01/10/24  Culture, blood (Routine X 2) w Reflex to ID Panel     Status: None   Collection Time: 01/10/24 12:33 PM   Specimen: BLOOD  Result Value Ref Range Status   Specimen Description BLOOD RIGHT ANTECUBITAL  Final   Special Requests   Final    BOTTLES DRAWN AEROBIC AND ANAEROBIC Blood Culture results may not be optimal due to an inadequate volume of blood received in culture bottles   Culture   Final    NO GROWTH 5 DAYS Performed at [facility], [address] [place], [place] [address]    Report Status 01/15/2024 FINAL  Final  Blood culture (single)     Status: None   Collection Time: 01/10/24  1:28 PM   Specimen: BLOOD  Result Value Ref Range Status   Specimen Description BLOOD BLOOD LEFT HAND  Final   Special Requests   Final    BOTTLES DRAWN AEROBIC AND ANAEROBIC Blood Culture adequate volume   Culture   Final    NO GROWTH 5 DAYS Performed at [facility], [address] [place], [place] [address]    Report Status 01/15/2024 FINAL  Final  Aerobic/Anaerobic Culture w Gram Stain (surgical/deep wound)     Status: None   Collection Time: 01/11/24 11:12 AM   Specimen: Foot, Right; Tissue  Result Value Ref Range Status   Specimen Description   Final    WOUND Performed at [facility], [address], [place], [place] [address]    Special Requests   Final    RIGHT FOOT TISSUE SWAB Performed at [facility], [address] [place], [place] [address]    Gram Stain NO WBC SEEN RARE GRAM NEGATIVE RODS  Final   Culture   Final    FEW ENTEROBACTER CLOACAE FEW PREVOTELLA MELANINOGENICA BETA LACTAMASE POSITIVE Performed at [facility], [address] [place], [place] [address]    Report Status 01/14/2024 FINAL  Final   Organism ID, Bacteria ENTEROBACTER CLOACAE  Final      Susceptibility   Enterobacter cloacae - MIC*    CEFEPIME  0.5 SENSITIVE Sensitive     GENTAMICIN  8 INTERMEDIATE Intermediate     MEROPENEM  <=0.25 SENSITIVE Sensitive     TRIMETH /SULFA  >=320 RESISTANT Resistant     PIP/TAZO Value in next row Intermediate ug/mL     64 INTERMEDIATEThis is a modified FDA-approved test that has been validated and its performance characteristics determined by the reporting laboratory.  This laboratory is certified under the Clinical Laboratory Improvement Amendments CLIA as qualified to perform high complexity clinical laboratory testing.    * FEW ENTEROBACTER CLOACAE  Aerobic/Anaerobic Culture w Gram Stain (surgical/deep wound)     Status: None   Collection Time: 01/11/24 11:12 AM   Specimen: Foot, Right; Blood  Result Value Ref Range Status   Specimen Description   Final    WOUND Performed at [facility], [address] [place], [place] [address]    Special Requests   Final    BONE CULTURE RIGHT FOOT CUP Performed at [facility], [address] [place], [place] [address]    Gram Stain   Final    RARE WBC PRESENT, PREDOMINANTLY PMN NO ORGANISMS SEEN    Culture   Final    RARE ENTEROBACTER CLOACAE CRITICAL RESULT CALLED TO, READ BACK BY AND VERIFIED WITH: RN DANIAL BEAL [phone] AT 0955, ADC FEW PREVOTELLA MELANINOGENICA BETA LACTAMASE POSITIVE Performed at [facility], [address], [place], [place] [address]    Report  Status 01/14/2024 FINAL  Final   Organism ID, Bacteria ENTEROBACTER CLOACAE  Final      Susceptibility   Enterobacter cloacae - MIC*    CEFEPIME  0.5 SENSITIVE Sensitive     GENTAMICIN  8 INTERMEDIATE Intermediate     MEROPENEM  <=0.25 SENSITIVE Sensitive     TRIMETH /SULFA  >=320 RESISTANT Resistant     PIP/TAZO Value in next row Intermediate ug/mL     32 INTERMEDIATEThis is a modified FDA-approved test that has been validated and its performance characteristics determined by the reporting laboratory.  This laboratory is certified under the Clinical Laboratory Improvement Amendments CLIA as qualified to perform high complexity clinical laboratory testing.    * RARE ENTEROBACTER CLOACAE    Labs: CBC: Recent Labs  Lab 01/10/24 1218 01/11/24 0507 01/12/24 0420 01/13/24 0457 01/14/24 0345 01/15/24 0454 01/16/24 0311  WBC 6.4   < > 8.9 6.1 7.4 4.8 3.7*  NEUTROABS 4.5  --   --   --   --   --   --   HGB 11.6*   < > 10.5* 9.6* 10.0* 9.2* 8.9*  HCT 36.5*   < > 32.6* 29.7* 31.3* 28.8* 27.9*  MCV 89.7   < > 88.3 87.6 88.2 88.9 88.3  PLT 193   < > 163 129* 145* 131* 121*   < > = values in this interval not displayed.   Basic Metabolic Panel: Recent Labs  Lab 01/10/24 1218 01/10/24 1247 01/11/24 0507 01/12/24 0420 01/13/24 0457  NA 140  --  141 139 137  K 4.1  --  3.7 3.6 3.6  CL 107  --  107 108 108  CO2 23  --  23 24 22   GLUCOSE 147*  --  165* 129* 115*  BUN 12  --  13 13 14   CREATININE 1.35*  --  1.31* 1.24 1.26*  CALCIUM 8.9  --  8.5* 8.5* 8.4*  MG  --  2.0  --   --   --    Liver Function Tests: No results for input(s): AST, ALT, ALKPHOS, BILITOT, PROT, ALBUMIN in the last 168 hours. CBG: Recent Labs  Lab 01/15/24 1146 01/15/24 1824 01/15/24 2028 01/16/24 0709 01/16/24 1134  GLUCAP 105* 94 162* 109* 157*    Discharge time spent: greater than 30 minutes.  Signed: Laree Lock, MD [facility] 01/16/2024

## 2024-01-16 NOTE — Progress Notes (Signed)
 Physical Therapy Treatment Patient Details Name: Andrew Harding MRN: [id number] DOB: [birth year] Today's Date: 01/16/2024   History of Present Illness Per chart review, Andrew Harding is a 73 y.o. male with medical history significant for  DM II, peripheral neuropathy,CAD, s/p CABG, HTN, Atrial fibrillation with eliquis  for stroke prophylaxis, s/p right great toe amputation.  PAD s/p stent to rt ant tibial artery, s/p percutaneous angioplasty of the right mid popliteal artery and the right tibioperoneal trunk followed by TMA on the right on 11/02/23, and left BKA. He was admitted with worsening pain and infection in right foot. s/p Excision of 4-5th right metatarsal on 01/11/2024    PT Comments  Pt was long sitting in bed upon arrival. He is A and O x 4. Endorses no pain at rest. He had already independently apply prosthesis prior to arrival. Author instructed pt to remove prevlon boot and apply post op shoe for OOB activity. Lengthy discussion about instructions not to ambulate long distances and that focus on rehab needs to be on RLE healing. Pt easily and safely able to exit bed, stand, and ambulate with RW without LOB. Pt is planning to DC home with HHPT.    If plan is discharge home, recommend the following: A little help with walking and/or transfers;A little help with bathing/dressing/bathroom;Assist for transportation;Help with stairs or ramp for entrance     Equipment Recommendations  None recommended by PT       Precautions / Restrictions Precautions Precautions: Fall Recall of Precautions/Restrictions: Intact Restrictions Weight Bearing Restrictions Per Provider Order: Yes RLE Weight Bearing Per Provider Order: Weight bearing as tolerated Other Position/Activity Restrictions: Per podiatrist, min WB in heel of RLE PRN to maintain balance with transfers, w/ WBing kept to an absolute minimum     Mobility  Bed Mobility Overal bed mobility: Modified Independent   Transfers Overall transfer  level: Modified independent Equipment used: Rolling walker (2 wheels) Transfers: Sit to/from Stand Sit to Stand: Modified independent (Device/Increase time)   Ambulation/Gait Ambulation/Gait assistance: Supervision Gait Distance (Feet): 50 Feet Assistive device: Rolling walker (2 wheels) Gait Pattern/deviations: Step-through pattern Gait velocity: decreased  General Gait Details: Pt was able to ambulate ~ 50 ft with RW. No LOB or safety concern throughout.   Stairs  General stair comments: pt elected not to perform stairs but does demonstrate safe abilities to be able to perform without physical assistance. pt discribed stair performance and states confidenece in abilities to perform at DC   Balance Overall balance assessment: Needs assistance Sitting-balance support: Feet supported Sitting balance-Leahy Scale: Good     Standing balance support: Bilateral upper extremity supported Standing balance-Leahy Scale: Good     Communication Communication Communication: No apparent difficulties  Cognition Arousal: Alert Behavior During Therapy: WFL for tasks assessed/performed   PT - Cognitive impairments: No apparent impairments    PT - Cognition Comments: Pt is A and O x4. Seems to have good insight of deficits. Planning to DC directly home once cleared medically Following commands: Intact      Cueing Cueing Techniques: Verbal cues         Pertinent Vitals/Pain Pain Assessment Pain Assessment: No/denies pain Pain Score: 0-No pain Pain Location: right foot Pain Intervention(s): Limited activity within patient's tolerance, Monitored during session     PT Goals (current goals can now be found in the care plan section) Acute Rehab PT Goals Patient Stated Goal: go home Progress towards PT goals: Progressing toward goals    Frequency  Min 1X/week       AM-PAC PT 6 Clicks Mobility   Outcome Measure  Help needed turning from your back to your side while in a  flat bed without using bedrails?: None Help needed moving from lying on your back to sitting on the side of a flat bed without using bedrails?: None Help needed moving to and from a bed to a chair (including a wheelchair)?: None Help needed standing up from a chair using your arms (e.g., wheelchair or bedside chair)?: A Little Help needed to walk in hospital room?: A Little Help needed climbing 3-5 steps with a railing? : A Little 6 Click Score: 21    End of Session Equipment Utilized During Treatment: Other (comment) (prosthesis LLE) Activity Tolerance: Patient tolerated treatment well Patient left: in bed;with bed alarm set;with call bell/phone within reach Nurse Communication: Mobility status;Weight bearing status PT Visit Diagnosis: Other abnormalities of gait and mobility (R26.89)     Time: 9262-9192 PT Time Calculation (min) (ACUTE ONLY): 30 min  Charges:    $Gait Training: 8-22 mins $Therapeutic Activity: 8-22 mins PT General Charges $$ ACUTE PT VISIT: 1 Visit                    Rankin Essex PTA 01/16/24, 8:16 AM

## 2024-01-16 NOTE — Progress Notes (Signed)
 Occupational Therapy Treatment Patient Details Name: Andrew Harding MRN: [id number] DOB: 11-Sep-1950 Today's Date: 01/16/2024   History of present illness Per chart review, Andrew Harding is a 73 y.o. male with medical history significant for  DM II, peripheral neuropathy,CAD, s/p CABG, HTN, Atrial fibrillation with eliquis  for stroke prophylaxis, s/p right great toe amputation.  PAD s/p stent to rt ant tibial artery, s/p percutaneous angioplasty of the right mid popliteal artery and the right tibioperoneal trunk followed by TMA on the right on 11/02/23, and left BKA. He was admitted with worsening pain and infection in right foot. s/p Excision of 4-5th right metatarsal on 01/11/2024   OT comments  Pt seen for OT treatment on this date. Upon arrival to room pt sitting up in bed with MD present addressing wounds, agreeable to tx. Pt completed bed mobility with MODI as well as donning of post op shoe and prosthetic. Pt STS from lowest bed height with good technique throughout. Pt took few steps into recliner to simulate toilet transfer with use of RW. Pt verbalized eagerness to return home and get back to his life. 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 Pt educated on safe techniques when retuning home including showering with appropriate DME and transferring/ambulating self onto uneven surfaces. Pt verbalizes understanding via teach back method. Pt making good progress toward goals, will continue to follow POC. Discharge recommendation remains appropriate.        If plan is discharge home, recommend the following:  A little help with walking and/or transfers;A little help with bathing/dressing/bathroom;Help with stairs or ramp for entrance   Equipment Recommendations  None recommended by OT    Recommendations for Other Services      Precautions / Restrictions Precautions Precautions: Fall Recall of Precautions/Restrictions: Intact Precaution/Restrictions Comments: no recent  falls Restrictions Weight Bearing Restrictions Per Provider Order: Yes RLE Weight Bearing Per Provider Order: Weight bearing as tolerated Other Position/Activity Restrictions: Per podiatrist, min WB in heel of RLE PRN to maintain balance with transfers, w/ WBing kept to an absolute minimum       Mobility Bed Mobility Overal bed mobility: Modified Independent             General bed mobility comments: no physical support when supine<>sitting on EOB    Transfers Overall transfer level: Modified independent Equipment used: Rolling walker (2 wheels) Transfers: Sit to/from Stand Sit to Stand: Modified independent (Device/Increase time)           General transfer comment: Min verbal cues for safety awareness     Balance Overall balance assessment: Needs assistance Sitting-balance support: Feet supported Sitting balance-Leahy Scale: Good Sitting balance - Comments: Steady reaching outside BOS   Standing balance support: Bilateral upper extremity supported Standing balance-Leahy Scale: Good Standing balance comment: able to maintain minimal WB to RLE heel for transfer with RW                           ADL either performed or assessed with clinical judgement   ADL Overall ADL's : Needs assistance/impaired                     Lower Body Dressing: Modified independent Lower Body Dressing Details (indicate cue type and reason): Don/doff prosthesis and postop shoe Toilet Transfer: Supervision/safety;Rolling walker (2 wheels)           Functional mobility during ADLs: Modified independent;Rolling walker (2 wheels) General ADL  Comments: review WB precautions prior to OOB mobility     Communication Communication Communication: No apparent difficulties   Cognition Arousal: Alert Behavior During Therapy: WFL for tasks assessed/performed Cognition: No apparent impairments             OT - Cognition Comments: A/Ox4                  Following commands: Intact        Cueing   Cueing Techniques: Verbal cues  Exercises Exercises: Other exercises Other Exercises Other Exercises: Edu: Role of OT session, purpose of OT at DC, safe ADL completion    Shoulder Instructions       General Comments MD in room on arrival changing RLE dressings    Pertinent Vitals/ Pain       Pain Assessment Pain Assessment: No/denies pain                                                          Frequency  Min 2X/week        Progress Toward Goals  OT Goals(current goals can now be found in the care plan section)  Progress towards OT goals: Progressing toward goals  Acute Rehab OT Goals OT Goal Formulation: With patient Time For Goal Achievement: 01/26/24 Potential to Achieve Goals: Good ADL Goals Pt Will Perform Lower Body Dressing: sitting/lateral leans;with modified independence Pt Will Transfer to Toilet: with modified independence;ambulating Additional ADL Goal #1: Pt will clearly describe and demonstrate WBing precautions   AM-PAC OT 6 Clicks Daily Activity     Outcome Measure   Help from another person eating meals?: None Help from another person taking care of personal grooming?: A Little Help from another person toileting, which includes using toliet, bedpan, or urinal?: A Little Help from another person bathing (including washing, rinsing, drying)?: A Little Help from another person to put on and taking off regular upper body clothing?: None Help from another person to put on and taking off regular lower body clothing?: None 6 Click Score: 21    End of Session Equipment Utilized During Treatment: Rolling walker (2 wheels)  OT Visit Diagnosis: Unsteadiness on feet (R26.81);Other abnormalities of gait and mobility (R26.89);Muscle weakness (generalized) (M62.81)   Activity Tolerance Patient tolerated treatment well   Patient Left in bed;with call bell/phone within reach   Nurse  Communication Mobility status        Time: 1349-1414 OT Time Calculation (min): 25 min  Charges: OT General Charges $OT Visit: 1 Visit OT Treatments $Self Care/Home Management : 8-22 mins $Therapeutic Activity: 23-37 mins  Larraine Colas M.S. OTR/L  01/16/24, 2:25 PM

## 2024-01-31 ENCOUNTER — Ambulatory Visit: Attending: Infectious Diseases | Admitting: Infectious Diseases

## 2024-01-31 VITALS — BP 116/63 | HR 59 | Temp 98.1°F

## 2024-01-31 DIAGNOSIS — E1140 Type 2 diabetes mellitus with diabetic neuropathy, unspecified: Secondary | ICD-10-CM | POA: Diagnosis not present

## 2024-01-31 DIAGNOSIS — L97319 Non-pressure chronic ulcer of right ankle with unspecified severity: Secondary | ICD-10-CM | POA: Insufficient documentation

## 2024-01-31 DIAGNOSIS — I4891 Unspecified atrial fibrillation: Secondary | ICD-10-CM | POA: Insufficient documentation

## 2024-01-31 DIAGNOSIS — Z951 Presence of aortocoronary bypass graft: Secondary | ICD-10-CM | POA: Insufficient documentation

## 2024-01-31 DIAGNOSIS — Z87891 Personal history of nicotine dependence: Secondary | ICD-10-CM | POA: Diagnosis not present

## 2024-01-31 DIAGNOSIS — T8743 Infection of amputation stump, right lower extremity: Secondary | ICD-10-CM | POA: Insufficient documentation

## 2024-01-31 DIAGNOSIS — Z89411 Acquired absence of right great toe: Secondary | ICD-10-CM

## 2024-01-31 DIAGNOSIS — Z7901 Long term (current) use of anticoagulants: Secondary | ICD-10-CM | POA: Diagnosis not present

## 2024-01-31 DIAGNOSIS — T8142XA Infection following a procedure, deep incisional surgical site, initial encounter: Secondary | ICD-10-CM | POA: Diagnosis not present

## 2024-01-31 DIAGNOSIS — Z89421 Acquired absence of other right toe(s): Secondary | ICD-10-CM

## 2024-01-31 DIAGNOSIS — Z794 Long term (current) use of insulin: Secondary | ICD-10-CM

## 2024-01-31 DIAGNOSIS — Z89512 Acquired absence of left leg below knee: Secondary | ICD-10-CM | POA: Diagnosis not present

## 2024-01-31 DIAGNOSIS — Z7982 Long term (current) use of aspirin: Secondary | ICD-10-CM | POA: Insufficient documentation

## 2024-01-31 DIAGNOSIS — E1151 Type 2 diabetes mellitus with diabetic peripheral angiopathy without gangrene: Secondary | ICD-10-CM | POA: Diagnosis not present

## 2024-01-31 DIAGNOSIS — I739 Peripheral vascular disease, unspecified: Secondary | ICD-10-CM

## 2024-01-31 DIAGNOSIS — E1169 Type 2 diabetes mellitus with other specified complication: Secondary | ICD-10-CM | POA: Diagnosis not present

## 2024-01-31 DIAGNOSIS — I2510 Atherosclerotic heart disease of native coronary artery without angina pectoris: Secondary | ICD-10-CM | POA: Insufficient documentation

## 2024-01-31 DIAGNOSIS — I10 Essential (primary) hypertension: Secondary | ICD-10-CM | POA: Diagnosis not present

## 2024-01-31 DIAGNOSIS — M86171 Other acute osteomyelitis, right ankle and foot: Secondary | ICD-10-CM | POA: Diagnosis not present

## 2024-01-31 DIAGNOSIS — E11628 Type 2 diabetes mellitus with other skin complications: Secondary | ICD-10-CM

## 2024-01-31 DIAGNOSIS — B9689 Other specified bacterial agents as the cause of diseases classified elsewhere: Secondary | ICD-10-CM | POA: Diagnosis not present

## 2024-01-31 DIAGNOSIS — M86671 Other chronic osteomyelitis, right ankle and foot: Secondary | ICD-10-CM

## 2024-01-31 DIAGNOSIS — E11622 Type 2 diabetes mellitus with other skin ulcer: Secondary | ICD-10-CM | POA: Diagnosis not present

## 2024-01-31 DIAGNOSIS — Z95820 Peripheral vascular angioplasty status with implants and grafts: Secondary | ICD-10-CM | POA: Diagnosis not present

## 2024-01-31 DIAGNOSIS — E1142 Type 2 diabetes mellitus with diabetic polyneuropathy: Secondary | ICD-10-CM

## 2024-01-31 DIAGNOSIS — Z7984 Long term (current) use of oral hypoglycemic drugs: Secondary | ICD-10-CM

## 2024-01-31 DIAGNOSIS — Z4789 Encounter for other orthopedic aftercare: Secondary | ICD-10-CM | POA: Diagnosis present

## 2024-01-31 DIAGNOSIS — Z8614 Personal history of Methicillin resistant Staphylococcus aureus infection: Secondary | ICD-10-CM | POA: Diagnosis not present

## 2024-01-31 NOTE — Progress Notes (Signed)
 NAME: Andrew Harding  DOB: [birth year]  MRN: [id number]  Date/Time: 01/31/2024 10:36 AM   Subjective:  Follow up visit for right foot infection he.  He is here with his friend Ron.  He is in wheelchair.  Coming from him. He was at [facility] 9/11-9/17 and had debridement of the rt foot surgical site Bone biopsy was - ACTIVE AND CHRONIC OSTEOMYELITIS. Culture was Enterobacter and prevotella He underwent angio on 01/15/2024 and the findings were as below The right common femoral was widely patent as was the profunda femoris.  The SFA was widely patent With 3 lesions that were hemodynamically significant and these were in the mid popliteal area as well as the ostial lesion in the anterior tibial artery.  The critical lesion in the mid anterior tibial artery appears to be more of a hyperplastic response when compared to the previous study.  The previously placed stent in the TP trunk was widely patent Peroneal is patent down to the ankle but does not seem to contribute to the foot much. Posterior tibial was patent proximally but demonstrated diffuse disease which is rather extensive in its midportion and then its distal one third is occluded and there is minimal if any reconstitution of the plantar arteries. Following angioplasty and stent placement in 2 locations of the anterior tibial there was inline flow and looked quite nice with less than 10% residual stenosis.  Angioplasty and stent placement of the mid popliteal yielded an excellent result with less than 10% residual stenosis so there was successful recannulization of the right lower extremity for limb salvage. 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 male with a history of diabetes mellitus, peripheral neuropathy, PAD  left BKA ,CAD status post CABG, hypertension,  A-fib on Eliquis , right great toe amputation Presented to [facility] on 7/7  with nonhealing wound on right great toe at the site of the prior amputation Patient  in the month of May 2025   underwent excision first metatarsal right foot along with excision of the sesamoid  right first MTP joint by Dr. Ashley..  Culture then was MRSA.  Patient placed on  Bactrim   Patient at that time also underwent angio and had angioplasty and stent placement to the right anterior tibial artery, percutaneous transluminal angioplasty of the right mid popliteal artery and the right tibioperoneal trunk.. As the wound progressed he underwent TMA on 11/02/23 Pathology acute osteo but margin clear of osteo- culture MRSA/ enterococcus fecalis - decided to treat with 4 weeks of IV dapto which were extended by 2 more weeks to complete 6 weeks on 12/14/2023 The wound healing has been complicated by superficial necrosis of the surgical site and also some dehiscence He also had developed 2 pressure ulcers on the malleolus from the cam boot.  This has gotten better since he removed the cam boot. 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) 09/03/2023   Atrial fibrillation  with RVR (HCC) 08/19/2022   Bladder neck obstruction    Cellulitis 08/17/2022   Chronic kidney disease    Coronary artery disease    a.) s/p 4v CABG in 2014   Diabetes mellitus without complication (HCC)    Diabetic neuropathy (HCC)    Diabetic peripheral neuropathy (HCC)    Diabetic ulcer of toe of left foot associated with diabetes mellitus of other type, limited  to breakdown of skin (HCC) 12/21/2017   Diverticulosis    Gout    Gram-negative bacteremia 05/11/2023   Heart murmur    History of osteomyelitis 05/31/2015   Hypercholesteremia    Hyperlipidemia    Hypertension    Hypotension due to hypovolemia 08/17/2022   Infection of left foot 08/19/2022   MSSA bacteremia 02/28/2023   Open wound of left foot with complication 05/10/2023   Osteomyelitis (HCC) 05/31/2015   Peripheral neuropathy    Postural dizziness with presyncope 02/26/2023   S/P BKA (below knee amputation) unilateral, 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   S/P transmetatarsal amputation of foot, left (HCC) 08/29/2022   Sepsis (HCC) 08/17/2022   Sepsis (HCC) 05/10/2023   Status post amputation of toe of right foot 11/01/2015   Tubular adenoma    Vitamin D  deficiency     Past Surgical History:  Procedure Laterality Date   ABDOMINAL AORTOGRAM W/LOWER EXTREMITY N/A 11/05/2023   Procedure: ABDOMINAL AORTOGRAM W/LOWER EXTREMITY;  Surgeon: Marea Selinda RAMAN, MD;  Location: [facility];  Service: Cardiovascular;  Laterality: N/A;   ACHILLES TENDON SURGERY Right 09/14/2023   Procedure: TENOTOMY, ACHILLES;  Surgeon: [name], DPM;  Location: [facility];  Service: Orthopedics/Podiatry;  Laterality: Right;   AMPUTATION Left 08/19/2022   Procedure: TRANSMETATARSAL AMPUTATION LEFT FOOT WITH IRRIGATION AND DEBRIDEMENT;  Surgeon: Lennie Barter, DPM;  Location: [facility];  Service: Podiatry;  Laterality: Left;   AMPUTATION Left 05/16/2023   Procedure: AMPUTATION BELOW KNEE;  Surgeon: Marea Selinda RAMAN, MD;  Location: [facility];  Service: General;  Laterality: Left;   AMPUTATION Right 09/14/2023   Procedure: AMPUTATION, FOOT, RAY;  Surgeon: [name], DPM;  Location: [facility];  Service: Orthopedics/Podiatry;  Laterality: Right;   AMPUTATION TOE Right 06/01/2015   Procedure: AMPUTATION TOE;  Surgeon: Donnice Cory, DPM;  Location: [facility];  Service: Podiatry;  Laterality: Right;    AMPUTATION TOE Left 08/11/2022   Procedure: AMPUTATION TOE 2, 3, 4;  Surgeon: [name], DPM;  Location: [facility];  Service: Podiatry;  Laterality: Left;   CATARACT EXTRACTION, BILATERAL     CIRCUMCISION N/A 06/12/2022   Procedure: CIRCUMCISION ADULT;  Surgeon: Penne Knee, MD;  Location: [facility];  Service: Urology;  Laterality: N/A;   COLONOSCOPY WITH PROPOFOL  N/A 02/14/2016   Procedure: COLONOSCOPY WITH PROPOFOL ;  Surgeon: Gladis RAYMOND Mariner, MD;  Location[facility] ENDOSCOPY;  Service: Endoscopy;  Laterality: N/A;   COLONOSCOPY WITH PROPOFOL  N/A 01/07/2019   Procedure: COLONOSCOPY WITH PROPOFOL ;  Surgeon: Mariner Gladis RAYMOND, MD;  Location: [facility] ENDOSCOPY;  Service: Endoscopy;  Laterality: N/A;   CORONARY ARTERY BYPASS GRAFT N/A 08/2012   EXCISION PARTIAL PHALANX Right 06/01/2015   Procedure: EXCISION PARTIAL PHALANX /  BONE;  Surgeon: Donnice Cory, DPM;  Location: [facility];  Service: Podiatry;  Laterality: Right;   FLEXIBLE SIGMOIDOSCOPY N/A 05/29/2016   Procedure: FLEXIBLE SIGMOIDOSCOPY;  Surgeon: Gladis RAYMOND Mariner, MD;  Location[facility] ENDOSCOPY;  Service: Endoscopy;  Laterality: N/A;   INCISION AND DRAINAGE Left 08/23/2022   Procedure: INCISION AND DRAINAGE;  Surgeon: [name], DPM;  Location: [facility];  Service: Podiatry;  Laterality: Left;   INCISION AND DRAINAGE OF WOUND Left 09/15/2022   Procedure: 11044 - DEBRIDE BONE and EXCISION IF 1ST METATARSAL BONE WITH  DELAY PRIMARY CLOSURE;  Surgeon: [name], DPM;  Location: [facility];  Service: Podiatry;  Laterality: Left;   IRRIGATION AND DEBRIDEMENT FOOT Left 02/28/2023   Procedure: IRRIGATION AND DEBRIDEMENT FOOT;  Surgeon: [name], DPM;  Location: [facility];  Service: Orthopedics/Podiatry;  Laterality: Left;   IRRIGATION AND DEBRIDEMENT FOOT Right 01/11/2024   Procedure: IRRIGATION AND DEBRIDEMENT FOOT;  Surgeon: [name], DPM;  Location: [facility];  Service: Orthopedics/Podiatry;  Laterality: Right;   KNEE  ARTHROSCOPY Left    LOWER EXTREMITY ANGIOGRAPHY Left 08/22/2022   Procedure: Lower Extremity Angiography;  Surgeon: Jama Cordella MATSU, MD;  Location: [facility];  Service: Cardiovascular;  Laterality: Left;   LOWER EXTREMITY ANGIOGRAPHY Left 05/11/2023   Procedure: Lower Extremity Angiography;  Surgeon: Marea Selinda RAMAN, MD;  Location: [facility];  Service: Cardiovascular;  Laterality: Left;   LOWER EXTREMITY ANGIOGRAPHY Right 09/11/2023   Procedure: Lower Extremity Angiography;  Surgeon: Jama Cordella MATSU, MD;  Location: [facility];  Service: Cardiovascular;  Laterality: Right;   LOWER EXTREMITY ANGIOGRAPHY Right 01/15/2024   Procedure: Lower Extremity Angiography;  Surgeon: Jama Cordella MATSU, MD;  Location: [facility];  Service: Cardiovascular;  Laterality: Right;   LOWER EXTREMITY INTERVENTION Right 09/11/2023   Procedure: LOWER EXTREMITY INTERVENTION;  Surgeon: Jama Cordella MATSU, MD;  Location: [facility];  Service: Cardiovascular;  Laterality: Right;   TEE WITHOUT CARDIOVERSION N/A 03/01/2023   Procedure: TRANSESOPHAGEAL ECHOCARDIOGRAM;  Surgeon: Alluri, Keller BROCKS, MD;  Location: [facility];  Service: Cardiovascular;  Laterality: N/A;   TRANSMETATARSAL AMPUTATION Right 11/02/2023   Procedure: AMPUTATION, FOOT, TRANSMETATARSAL;  Surgeon: [name], DPM;  Location: [facility];  Service: Orthopedics/Podiatry;  Laterality: Right;   TRANSMETATARSAL AMPUTATION Right 12/21/2023   Procedure: REVISION, AMPUTATION, FOOT, TRANSMETATARSAL;  Surgeon: [name], DPM;  Location: [facility];  Service: Orthopedics/Podiatry;  Laterality: Right;   WOUND DEBRIDEMENT Left 09/15/2022   Procedure: 11043 - DEBRIDE SKIN. MUSCLE FASCIA;  Surgeon: [name], DPM;  Location: [facility];  Service: Podiatry;  Laterality: Left;    Social History   Socioeconomic History   Marital status: Divorced    Spouse name: Ashley,Angela C   Number of children: Not on file   Years  of education: Not on file   Highest education level: Not on file  Occupational History   Not on file  Tobacco Use   Smoking status: Former    Types: Cigars   Smokeless tobacco: Never  Vaping Use   Vaping status: Never Used  Substance and Sexual Activity   Alcohol use: No   Drug use: No   Sexual activity: Never  Other Topics Concern   Not on file  Social History Narrative   Patient currently resides at    [employer]   [address]  [place], [place]  [phone]      Patient is legally separated and lives at home by himself. His ex -wife is his HCPOA. Neighbors and friends are his support system. He used to work for [employer]    Social Drivers of Corporate investment banker Strain: Low Risk  (12/13/2022)   Received from [facility]   Overall Financial Resource Strain (CARDIA)    Difficulty of  Paying Living Expenses: Not hard at all  Food Insecurity: No Food Insecurity (01/10/2024)   Hunger Vital Sign    Worried About Running Out of Food in the Last Year: Never true    Ran Out of Food in the Last Year: Never true  Transportation Needs: No Transportation Needs (01/10/2024)   PRAPARE - Administrator, Civil Service (Medical): No    Lack of Transportation (Non-Medical): No  Physical Activity: Not on file  Stress: Not on file  Social Connections: Moderately Isolated (01/10/2024)   Social Connection and Isolation Panel    Frequency of Communication with Friends and Family: More than three times a week    Frequency of Social Gatherings with Friends and Family: Twice a week    Attends Religious Services: More than 4 times per year    Active Member of [employer] or Organizations: No    Attends Banker Meetings: Never    Marital Status: Divorced  Catering manager Violence: Not At Risk (01/10/2024)   Humiliation, Afraid, Rape, and Kick questionnaire    Fear of Current or Ex-Partner: No    Emotionally Abused: No     Physically Abused: No    Sexually Abused: No    Family History  Problem Relation Age of Onset   Diabetes Mellitus II Mother    CAD Mother    No Known Allergies I? Current Outpatient Medications  Medication Sig Dispense Refill   acetaminophen  (TYLENOL ) 325 MG tablet Take 2 tablets (650 mg total) by mouth every 6 (six) hours as needed for mild pain (pain score 1-3) or fever (or Fever >/= 101). 30 tablet 0   aspirin  EC 81 MG tablet Take 1 tablet (81 mg total) by mouth daily. Swallow whole. 90 tablet 1   Continuous Glucose Sensor (FREESTYLE LIBRE 2 SENSOR) MISC      cyanocobalamin  (VITAMIN B12) 1000 MCG tablet Take 1 tablet (1,000 mcg total) by mouth daily.     Dulaglutide  (TRULICITY ) 0.75 MG/0.5ML SOAJ Inject 0.75 mg into the skin once a week. Every Monday     ELIQUIS  5 MG TABS tablet TAKE 1 TABLET(5 MG) BY MOUTH TWICE DAILY 60 tablet 2   ertapenem  (INVANZ ) IVPB Inject 1 g into the vein daily. Indication:  E cloacae diabetic foot infection and osteomyelitis First Dose: Yes Last Day of Therapy:  02/21/2024 Labs - Once weekly:  CBC/D, CMP, ESR and CRP Fax weekly lab results  promptly to [phone]  Method of administration: Mini-Bag Plus / Gravity Method of administration may be changed at the discretion of home infusion pharmacist based upon assessment of the patient and/or caregiver's ability to self-administer the medication ordered. Please pull PICC at completion of IV antibiotics Call [phone] with any questions or critical value 37 Units 0   gabapentin  (NEURONTIN ) 300 MG capsule Take 1 capsule (300 mg total) by mouth 3 (three) times daily. 90 capsule 0   glucose blood (ONETOUCH ULTRA) test strip Use 3 (three) times daily     insulin  glargine (LANTUS ) 100 UNIT/ML Solostar Pen Inject 28 Units into the skin at bedtime. (Patient taking differently: Inject 28 Units into the skin daily.)     Insulin  Pen Needle (FIFTY50 PEN NEEDLES) 32G X 4 MM MISC USE 4 TIMES DAILY     metFORMIN   (GLUCOPHAGE -XR) 500 MG 24 hr tablet Take 1,000 mg by mouth 2 (two) times daily with a meal.     metoprolol  tartrate (LOPRESSOR ) 25 MG tablet Take 0.5 tablets (12.5 mg total)  by mouth 2 (two) times daily.     pravastatin  (PRAVACHOL ) 80 MG tablet Take 80 mg by mouth daily.     traZODone  (DESYREL ) 50 MG tablet Take 50 mg by mouth at bedtime.     No current facility-administered medications for this visit.     Abtx:  Anti-infectives (From admission, onward)    None       REVIEW OF SYSTEMS:  Const: negative fever, negative chills, negative weight loss Eyes: negative diplopia or visual changes, negative eye pain ENT: negative coryza, negative sore throat Resp: negative cough, hemoptysis, dyspnea Cards: negative for chest pain, palpitations, lower extremity edema GU: negative for frequency, dysuria and hematuria GI: Negative for abdominal pain, diarrhea, bleeding, constipation Skin: negative for rash and pruritus Heme: negative for easy bruising and gum/nose bleeding MS: as above Neurolo:negative for headaches, dizziness, vertigo, memory problems  Psych: pt upset that the foot is not healing and does not want to get IV antibiotics Endocrine: , diabetes Allergy/Immunology- negative for any medication or food allergies ?  Objective:  VITALS:  BP 116/63   Pulse (!) 59   Temp 98.1 F (36.7 C) (Temporal)   SpO2 99%  LDA Rt PICC PHYSICAL EXAM:  General: Alert, cooperative, no distress, Lungs: Clear to auscultation bilaterally. No Wheezing or Rhonchi. No rales. Heart: Regular rate and rhythm, no murmur, rub or gallop. Abdomen: did not examine Extremities: rt foot TMA site- much improved sutures have been removed except at the lateral end      01/08/24     12/13/23   11/29/23    11/20/23          Left leg BKA Prosthetic leg Skin: No rashes or lesions. Or bruising Lymph: Cervical, supraclavicular normal. Neurologic: Grossly non-focal Pertinent Labs Lab  Results CBC    Component Value Date/Time   WBC 3.7 (L) 01/16/2024 0311   RBC 3.16 (L) 01/16/2024 0311   HGB 8.9 (L) 01/16/2024 0311   HCT 27.9 (L) 01/16/2024 0311   PLT 121 (L) 01/16/2024 0311   MCV 88.3 01/16/2024 0311   MCH 28.2 01/16/2024 0311   MCHC 31.9 01/16/2024 0311   RDW 13.3 01/16/2024 0311   LYMPHSABS 1.2 01/10/2024 1218   MONOABS 0.4 01/10/2024 1218   EOSABS 0.3 01/10/2024 1218   BASOSABS 0.1 01/10/2024 1218       Latest Ref Rng & Units 01/13/2024    4:57 AM 01/12/2024    4:20 AM 01/11/2024    5:07 AM  CMP  Glucose 70 - 99 mg/dL 884  870  834   BUN 8 - 23 mg/dL 14  13  13    Creatinine 0.61 - 1.24 mg/dL 8.73  8.75  8.68   Sodium 135 - 145 mmol/L 137  139  141   Potassium 3.5 - 5.1 mmol/L 3.6  3.6  3.7   Chloride 98 - 111 mmol/L 108  108  107   CO2 22 - 32 mmol/L 22  24  23    Calcium 8.9 - 10.3 mg/dL 8.4  8.5  8.5      ? Impression/Recommendation ?Diabetic foot infection with peripheral artery disease involving the right foot at the site of prior amputation of the great toe Underwent TMA on 11/02/2023 Culture MRSA / enterococcus fecalis Bone pathology acute osteo but margin clear. Completed 6 weeks of IV daptomycin  around 12/21/23 Some ischemic necrosis of the skin at the surgical site There was some dehiscence at the surgical site on the lateral margin Further debridement on 12/21/23-  Enterobacter, pseudeschricia  and granulocutella- on levaquin and augmentin  01/03/24 culture sent by Dr.Fowler- enterobacter-MDR Admitted 9/11-9/17 further debridement of the site Bone biopsy was - ACTIVE AND CHRONIC OSTEOMYELITIS. Culture was Enterobacter and prevotella Discharged on ertapenem  to complete 6 weeks on 02/21/24 Doing better Recent ESR and CRP N   PAD Followed by vein and vascular On 11/05/2023 underwent angio and had mechanical thrombectomy of the right anterior tibial artery, balloon angioplasty of the right anterior tibial artery and stent placement of the same.   Angioplasty of the right distal popliteal artery, tibioperoneal trunk and stent placement in the right tibioperoneal trunk 01/15/24 angio and stent to anterior tibial and mid popliteal  The rt posterior tibial artery   had diffuse disease which is rather extensive in its midportion and then its distal one third is occluded and there is minimal if any reconstitution of the plantar arteries.   History of left BKA    CAD Status post CABG   A-fib on  Eliquis    ?  ________________________________________________ Discussed with patient, and his friend-  Follow in 3 weeks Continue ertapenem 

## 2024-01-31 NOTE — Patient Instructions (Signed)
 Continue IV ertapenem  Weekly dressing change of PICC site Follow with Dr.Fowler- weekly appt Will follow in 3 weeks

## 2024-02-01 NOTE — Progress Notes (Signed)
 73 y.o. returns for followup. He was last seen on 07/23/2023.   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 Hospital records were reviewed.  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 He has a persistent right diabetic foot ulcer and follows with Podiatry. He is receiving IV antibiotics via a PICC line. His has been doing PT and has not yet been recommended for weight bearing. Today is in a wheelchair.   Type 2 diabetes. His Hb A1c was 7.1% on 01/28/2024. He does have anemia and this Hb A1c does not match with the blood glucose readings available for review on his CGM. He has a [employer] 2 CGM. His CGM was downloaded and reviewed. Over the last 14 days, his avg sugar was 234 mg/dl. He had 22% of sugars in target range, 78% over range, and 0% under range with a target range set at 70 - 180 mg/dl. Sensor data captured was only 28%. He scans blood glucose readings with Libre reader on avg 1 time daily  Pattern difficult to assess as there are few readings. Tends to have hyperglycemia throughout the day and night with lowest readings possible from 6-7am. Now taking for diabetes: Ozempic 1.0 mg weekly, Lantus  10 units qhs, and metformin  ER 2000 mg daily. He explains he had not been taking any Lantus  up until 3 days ago. Prior to that, was either at the hospital or at a rehab facility and he believes he had not taken Lantus  or metformin  for a few months. We discussed his diet. He is eating cereals for breakfast most days. He frequently is eating simple refined carbohydrates.   His diabetes is complicated by vascular disease, peripheral neuropathy and microalbuminuria. He had a normal dilated eye exam at [facility] on 07/25/2022.    Hypertension. Hypertension is controlled on metoprolol . No longer taking ACE-I due to hyperkalemia.   Hyperlipidemia.  Now taking pravastatin . He cannot recall when or why it was stopped.   Past Medical History:  Diagnosis Date  . Atrial fibrillation (CMS/HHS-HCC)   . Bacteremia due to Klebsiella pneumoniae    Hospitalized 8/12 at [facility] with Klebsiella bacteremia/sepsis with phlegmonous changes in liver.   . Bladder neck obstruction   . CAD (coronary artery disease)   . Diverticulosis 02/14/2016  . Flow murmur   . Gout   . History of sepsis    Hospitalization 2013  . Hyperlipidemia   . Hypertension   . Hypertriglyceridemia   . Obesity   . Osteomyelitis of left foot (CMS/HHS-HCC) 07/2022  . Tubular adenoma of colon 05/29/2016  . 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 Type 2 diabetes mellitus (CMS/HHS-HCC)    with peripheral neuropathy and retinopathy  . Vitamin D  deficiency     Outpatient Medications Marked as Taking for the 02/01/24 encounter (Office Visit) with Solum, Anna Melissa, MD  Medication Sig Dispense Refill  . apixaban  (ELIQUIS ) 5 mg tablet Take 5 mg by mouth every 12 (twelve) hours    . collagenase (SANTYL) ointment Apply to affected area with dressing change 30 g 2  . cyanocobalamin  (VITAMIN B12) 1000 MCG tablet Take 1,000 mcg by mouth once daily    . gabapentin  (NEURONTIN ) 300 MG capsule Take 1 capsule (300 mg total) by mouth 3 (three) times daily 270 capsule 3  . honey (MEDIHONEY, HONEY,) 100 % Pste Apply 1 g topically  once daily With dressing change 15 mL 2  . insulin  GLARGINE (LANTUS  SOLOSTAR U-100 INSULIN ) pen injector (concentration 100 units/mL) Inject 36 Units subcutaneously at bedtime (Patient taking differently: Inject 10 Units subcutaneously at bedtime) 45 mL 3  . iron  polysaccharides (FERREX) 150 mg iron  capsule Take 150 mg by mouth once daily    . metFORMIN  (GLUCOPHAGE -XR) 500 MG XR tablet Take 4 tablets (2,000 mg total) by mouth daily with dinner 360 tablet 3  . metoprolol  TARTrate (LOPRESSOR ) 25 MG tablet Take 1 tablet (25 mg total) by mouth 2 (two) times daily 180  tablet 3  . pravastatin  (PRAVACHOL ) 80 MG tablet Take 1 tablet (80 mg total) by mouth every evening 90 tablet 3  . semaglutide (OZEMPIC) 1 mg/dose (4 mg/3 mL) pen injector Inject 0.75 mLs (1 mg total) subcutaneously once a week 3 mL 11  . traZODone  (DESYREL ) 50 MG tablet Take 1 tablet (50 mg total) by mouth at bedtime as needed 90 tablet 3    Exam BP 100/62   Pulse 56   Ht 185.4 cm (6' 1)   Wt 88.5 kg (195 lb)   SpO2 99%   BMI 25.73 kg/m   GEN: well developed male in NAD.  PSYC: alert and oriented, good insight.   Labs Office Visit on 01/28/2024  Component Date Value Ref Range Status  . WBC (White Blood Cell Count) 01/28/2024 7.1  4.1 - 10.2 10^3/uL Final  . RBC (Red Blood Cell Count) 01/28/2024 3.87 (L)  4.69 - 6.13 10^6/uL Final  . Hemoglobin 01/28/2024 11.0 (L)  14.1 - 18.1 gm/dL Final  . Hematocrit 90/70/7974 34.5 (L)  40.0 - 52.0 % Final  . MCV (Mean Corpuscular Volume) 01/28/2024 89.1  80.0 - 100.0 fl Final  . MCH (Mean Corpuscular Hemoglobin) 01/28/2024 28.4  27.0 - 31.2 pg Final  . MCHC (Mean Corpuscular Hemoglobin * 01/28/2024 31.9 (L)  32.0 - 36.0 gm/dL Final  . Platelet Count 01/28/2024 207  150 - 450 10^3/uL Final  . RDW-CV (Red Cell Distribution Widt* 01/28/2024 13.3  11.6 - 14.8 % Final  . MPV (Mean Platelet Volume) 01/28/2024 11.6  9.4 - 12.4 fl Final  . Neutrophils 01/28/2024 4.99  1.50 - 7.80 10^3/uL Final  . Lymphocytes 01/28/2024 1.18  1.00 - 3.60 10^3/uL Final  . Monocytes 01/28/2024 0.37  0.00 - 1.50 10^3/uL Final  . Eosinophils 01/28/2024 0.51  0.00 - 0.55 10^3/uL Final  . Basophils 01/28/2024 0.05  0.00 - 0.09 10^3/uL Final  . Neutrophil % 01/28/2024 70.0  32.0 - 70.0 % Final  . Lymphocyte % 01/28/2024 16.6  10.0 - 50.0 % Final  . Monocyte % 01/28/2024 5.2  4.0 - 13.0 % Final  . Eosinophil % 01/28/2024 7.2 (H)  1.0 - 5.0 % Final  . Basophil% 01/28/2024 0.7  0.0 - 2.0 % Final  . Immature Granulocyte % 01/28/2024 0.3  <=0.7 % Final  . Immature  Granulocyte Count 01/28/2024 0.02  <=0.06 10^3/L Final  . Glucose 01/28/2024 237 (H)  70 - 110 mg/dL Final  . Sodium 90/70/7974 139  136 - 145 mmol/L Final  . Potassium 01/28/2024 4.5  3.6 - 5.1 mmol/L Final  . Chloride 01/28/2024 106  97 - 109 mmol/L Final  . Carbon Dioxide (CO2) 01/28/2024 24.4  22.0 - 32.0 mmol/L Final  . Urea  Nitrogen (BUN) 01/28/2024 17  7 - 25 mg/dL Final  . Creatinine 90/70/7974 1.1  0.7 - 1.3 mg/dL Final  . Glomerular Filtration Rate (eGFR) 01/28/2024 70  >  60 mL/min/1.73sq m Final   CKD-EPI (2021) does not include patient's race in the calculation of eGFR.  Monitoring changes of plasma creatinine and eGFR over time is useful for monitoring kidney function.   Interpretive Ranges for eGFR (CKD-EPI 2021):  eGFR:       >60 mL/min/1.73 sq. m - Normal eGFR:       30-59 mL/min/1.73 sq. m - Moderately Decreased eGFR:       15-29 mL/min/1.73 sq. m  - Severely Decreased eGFR:       < 15 mL/min/1.73 sq. m  - Kidney Failure    Note: These eGFR calculations do not apply in acute situations when eGFR is changing rapidly or patients on dialysis.  . Calcium 01/28/2024 9.0  8.7 - 10.3 mg/dL Final  . AST  90/70/7974 48 (H)  8 - 39 U/L Final  . ALT  01/28/2024 47  6 - 57 U/L Final  . Alk Phos (alkaline Phosphatase) 01/28/2024 93  34 - 104 U/L Final  . Albumin 01/28/2024 3.9  3.5 - 4.8 g/dL Final  . Bilirubin, Total 01/28/2024 0.3  0.3 - 1.2 mg/dL Final  . Protein, Total 01/28/2024 6.8  6.1 - 7.9 g/dL Final  . A/G Ratio 90/70/7974 1.3  1.0 - 5.0 gm/dL Final  . Hemoglobin J8R 01/28/2024 7.1 (H)  4.2 - 5.6 % Final  . Average Blood Glucose (Calc) 01/28/2024 157  mg/dL Final  . Cholesterol, Total 01/28/2024 185  100 - 200 mg/dL Final  . Triglyceride 90/70/7974 235 (H)  35 - 199 mg/dL Final  . HDL (High Density Lipoprotein) Cho* 01/28/2024 41.5  29.0 - 71.0 mg/dL Final  . LDL Calculated 01/28/2024 97  0 - 130 mg/dL Final  . VLDL Cholesterol 01/28/2024 47  mg/dL Final  .  Cholesterol/HDL Ratio 01/28/2024 4.5   Final  . Color 01/28/2024 Yellow  Colorless, Straw, Light Yellow, Yellow, Dark Yellow Final  . Clarity 01/28/2024 Clear  Clear Final  . Specific Gravity 01/28/2024 1.022  1.005 - 1.030 Final  . pH, Urine 01/28/2024 5.0  5.0 - 8.0 Final  . Protein, Urinalysis 01/28/2024 1+ (!)  Negative mg/dL Final  . Glucose, Urinalysis 01/28/2024 Trace (!)  Negative mg/dL Final  . Ketones, Urinalysis 01/28/2024 Negative  Negative mg/dL Final  . Blood, Urinalysis 01/28/2024 Trace (!)  Negative Final  . Nitrite, Urinalysis 01/28/2024 Negative  Negative Final  . Leukocyte Esterase, Urinalysis 01/28/2024 Negative  Negative Final  . Bilirubin, Urinalysis 01/28/2024 Negative  Negative Final  . Urobilinogen, Urinalysis 01/28/2024 0.2  0.2 - 1.0 mg/dL Final  . WBC, UA 90/70/7974 1  <=5 /hpf Final  . Red Blood Cells, Urinalysis 01/28/2024 1  <=3 /hpf Final  . Bacteria, Urinalysis 01/28/2024 0-5  0 - 5 /hpf Final  . Squamous Epithelial Cells, Urinaly* 01/28/2024 0  /hpf Final  . Thyroid Stimulating Hormone (TSH) 01/28/2024 1.076  0.450-5.330 uIU/ml uIU/mL Final  . Creatinine, Random Urine 01/28/2024 196.1  40.0 - 300.0 mg/dL Final  . Urine Albumin, Random 01/28/2024 93    mg/L Final  . Urine Albumin/Creatinine Ratio 01/28/2024 47.4 (H)  <30.0 ug/mg Final   Urine:         Spot collection              (g/mg creatinine)     Normal               < 30   Moderately          30-299  increased   Clinical             >=300 albuminuria  . Uric Acid 01/28/2024 5.3  4.4 - 7.6 mg/dL Final    Assessment 1. Poorly controlled type 2 diabetes mellitus (CMS/HHS-HCC)   2. Type 2 diabetes mellitus with diabetic polyneuropathy, with long-term current use of insulin  (CMS/HHS-HCC)   3. Type 2 diabetes mellitus with vascular disease (CMS/HHS-HCC)   4. Type 2 diabetes mellitus with stage 3a chronic kidney disease, with long-term current use of insulin  (CMS/HHS-HCC)   5. Type 2  diabetes mellitus with microalbuminuria, with long-term current use of insulin  (CMS/HHS-HCC)   6. S/P BKA (below knee amputation) unilateral, left (CMS/HHS-HCC)   7. Diabetic ulcer of right foot associated with type 2 diabetes mellitus, unspecified part of foot, unspecified ulcer stage (CMS/HHS-HCC)     Plan  - Counseled him that his diabetes is uncontrolled. We reviewed blood glucose and Hb A1c targets.  - Adjust the Lantus  insulin  to 16 units now. In 3 days, your fasting sugar is over 150 - adjust the Lantus  again to 20 units nightly. In 6 days, if fasting sugar is over 150 - adjust the Lantus  again to 24 units nightly.  - Continue Ozempic and metformin  as prescribed.  - Continue use of [employer] Libre CGM. Scan sugars 4x daily.  - Avoid all sweet drinks. Avoid all concentrated sweets. Cut back on refined carbohydrates. Suggested alternative high protein, low carb meal substitutes. - Hypertension is controlled. Continue metoprolol .  - Continue pravastatin  as prescribed for now. Will consider adjusting in follow up if LDL remains >70 mg/dl.     - A retinal scan will be obtained today. - Follow up in 4 weeks, or sooner if necessary.    I spent a total of 44 minutes in both face-to-face and non-face-to-face activities, excluding procedures performed, for this visit on the date of this encounter.

## 2024-02-19 ENCOUNTER — Other Ambulatory Visit (INDEPENDENT_AMBULATORY_CARE_PROVIDER_SITE_OTHER): Payer: Self-pay | Admitting: Vascular Surgery

## 2024-02-19 DIAGNOSIS — I739 Peripheral vascular disease, unspecified: Secondary | ICD-10-CM

## 2024-02-21 ENCOUNTER — Ambulatory Visit: Attending: Infectious Diseases | Admitting: Infectious Diseases

## 2024-02-21 ENCOUNTER — Ambulatory Visit: Admitting: Infectious Diseases

## 2024-02-21 ENCOUNTER — Encounter (INDEPENDENT_AMBULATORY_CARE_PROVIDER_SITE_OTHER)

## 2024-02-21 ENCOUNTER — Ambulatory Visit (INDEPENDENT_AMBULATORY_CARE_PROVIDER_SITE_OTHER)

## 2024-02-21 ENCOUNTER — Ambulatory Visit (INDEPENDENT_AMBULATORY_CARE_PROVIDER_SITE_OTHER): Admitting: Vascular Surgery

## 2024-02-21 ENCOUNTER — Encounter (INDEPENDENT_AMBULATORY_CARE_PROVIDER_SITE_OTHER): Payer: Self-pay | Admitting: Vascular Surgery

## 2024-02-21 VITALS — BP 120/71 | HR 79 | Temp 97.1°F

## 2024-02-21 VITALS — BP 97/63 | HR 82 | Resp 18 | Ht 75.0 in | Wt 180.2 lb

## 2024-02-21 DIAGNOSIS — B952 Enterococcus as the cause of diseases classified elsewhere: Secondary | ICD-10-CM | POA: Diagnosis not present

## 2024-02-21 DIAGNOSIS — E11628 Type 2 diabetes mellitus with other skin complications: Secondary | ICD-10-CM

## 2024-02-21 DIAGNOSIS — E1151 Type 2 diabetes mellitus with diabetic peripheral angiopathy without gangrene: Secondary | ICD-10-CM | POA: Insufficient documentation

## 2024-02-21 DIAGNOSIS — I4891 Unspecified atrial fibrillation: Secondary | ICD-10-CM | POA: Insufficient documentation

## 2024-02-21 DIAGNOSIS — Z794 Long term (current) use of insulin: Secondary | ICD-10-CM

## 2024-02-21 DIAGNOSIS — B9562 Methicillin resistant Staphylococcus aureus infection as the cause of diseases classified elsewhere: Secondary | ICD-10-CM | POA: Diagnosis not present

## 2024-02-21 DIAGNOSIS — Z89411 Acquired absence of right great toe: Secondary | ICD-10-CM | POA: Diagnosis present

## 2024-02-21 DIAGNOSIS — I4811 Longstanding persistent atrial fibrillation: Secondary | ICD-10-CM | POA: Diagnosis not present

## 2024-02-21 DIAGNOSIS — I10 Essential (primary) hypertension: Secondary | ICD-10-CM | POA: Diagnosis not present

## 2024-02-21 DIAGNOSIS — Z89512 Acquired absence of left leg below knee: Secondary | ICD-10-CM | POA: Diagnosis not present

## 2024-02-21 DIAGNOSIS — E1122 Type 2 diabetes mellitus with diabetic chronic kidney disease: Secondary | ICD-10-CM | POA: Diagnosis not present

## 2024-02-21 DIAGNOSIS — Z95820 Peripheral vascular angioplasty status with implants and grafts: Secondary | ICD-10-CM | POA: Insufficient documentation

## 2024-02-21 DIAGNOSIS — E1140 Type 2 diabetes mellitus with diabetic neuropathy, unspecified: Secondary | ICD-10-CM | POA: Diagnosis not present

## 2024-02-21 DIAGNOSIS — M86171 Other acute osteomyelitis, right ankle and foot: Secondary | ICD-10-CM | POA: Insufficient documentation

## 2024-02-21 DIAGNOSIS — I739 Peripheral vascular disease, unspecified: Secondary | ICD-10-CM

## 2024-02-21 DIAGNOSIS — Z7901 Long term (current) use of anticoagulants: Secondary | ICD-10-CM | POA: Insufficient documentation

## 2024-02-21 DIAGNOSIS — Z89421 Acquired absence of other right toe(s): Secondary | ICD-10-CM

## 2024-02-21 DIAGNOSIS — Z9889 Other specified postprocedural states: Secondary | ICD-10-CM | POA: Diagnosis not present

## 2024-02-21 DIAGNOSIS — I2510 Atherosclerotic heart disease of native coronary artery without angina pectoris: Secondary | ICD-10-CM | POA: Diagnosis not present

## 2024-02-21 DIAGNOSIS — Z951 Presence of aortocoronary bypass graft: Secondary | ICD-10-CM | POA: Insufficient documentation

## 2024-02-21 DIAGNOSIS — E1169 Type 2 diabetes mellitus with other specified complication: Secondary | ICD-10-CM | POA: Insufficient documentation

## 2024-02-21 DIAGNOSIS — N1832 Chronic kidney disease, stage 3b: Secondary | ICD-10-CM

## 2024-02-21 DIAGNOSIS — T8743 Infection of amputation stump, right lower extremity: Secondary | ICD-10-CM | POA: Diagnosis present

## 2024-02-21 DIAGNOSIS — M86671 Other chronic osteomyelitis, right ankle and foot: Secondary | ICD-10-CM | POA: Diagnosis not present

## 2024-02-21 DIAGNOSIS — Z7984 Long term (current) use of oral hypoglycemic drugs: Secondary | ICD-10-CM

## 2024-02-21 DIAGNOSIS — I7025 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: Secondary | ICD-10-CM

## 2024-02-21 LAB — VAS US ABI WITH/WO TBI: Right ABI: 1.03

## 2024-02-21 NOTE — Patient Instructions (Addendum)
 You are here for follow up of rt foot infection-today is your last day for IV ertapenem .  Follow up with podiatrist Follow up 1 month

## 2024-02-21 NOTE — Progress Notes (Signed)
 NAME: Andrew Harding  DOB: 1950/09/11  MRN: [id number]  Date/Time: 02/21/2024 9:25 AM   Subjective:   Andrew Harding Feeling is a 73 y.o. male with a history of diabetes mellitus, peripheral neuropathy, PAD left BKA ,CAD status post CABG, hypertension,  A-fib on Eliquis , right great toe amputation Presented to [facility] on 7/7  with nonhealing wound on right great toe at the site of the prior amputation  Follow up visit for right foot infection he.  He is here with his friend Andrew Harding.  He is in wheelchair.  Coming from home. Last seen a month ago Completing 6 weeks of IV ertapenem  today  He was at [facility] 9/11-9/17 and had debridement of the rt foot surgical site Bone biopsy was - ACTIVE AND CHRONIC OSTEOMYELITIS. Culture was Enterobacter and prevotella He underwent angio on 01/15/2024 and the findings were as below The right common femoral was widely patent as was the profunda femoris.  The SFA was widely patent With 3 lesions that were hemodynamically significant and these were in the mid popliteal area as well as the ostial lesion in the anterior tibial artery.  The critical lesion in the mid anterior tibial artery appears to be more of a hyperplastic response when compared to the previous study.  The previously placed stent in the TP trunk was widely patent Peroneal is patent down to the ankle but does not seem to contribute to the foot much. Posterior tibial was patent proximally but demonstrated diffuse disease which is rather extensive in its midportion and then its distal one third is occluded and there is minimal if any reconstitution of the plantar arteries. Following angioplasty and stent placement in 2 locations of the anterior tibial there was inline flow and looked quite nice with less than 10% residual stenosis.  Angioplasty and stent placement of the mid popliteal yielded an excellent result with less than 10% residual stenosis so there was successful recannulization of the right lower extremity for limb  salvage. 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 Ashley..  Culture then was MRSA.  Patient placed on  Bactrim   Patient at that time also underwent angio and had angioplasty and stent placement to the right anterior tibial artery, percutaneous transluminal angioplasty of the right mid popliteal artery and the right tibioperoneal trunk.. As the wound progressed he underwent TMA on 11/02/23 Pathology acute osteo but margin clear of osteo- culture MRSA/ enterococcus fecalis - decided to treat with 4 weeks of IV dapto which were extended by 2 more weeks to complete 6 weeks on 12/14/2023 The wound healing has been complicated by superficial necrosis of the surgical site and also some dehiscence He also had developed 2 pressure ulcers on the malleolus from the cam boot.  This has gotten better since he removed the cam boot. 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 (  HCC) 09/03/2023   Atrial fibrillation with RVR (HCC) 08/19/2022   Bladder neck obstruction    Cellulitis 08/17/2022   Chronic kidney disease    Coronary artery disease    a.) s/p 4v CABG in 2014   Diabetes mellitus without complication (HCC)    Diabetic neuropathy (HCC)    Diabetic peripheral neuropathy (HCC)    Diabetic ulcer of toe of left foot associated with diabetes mellitus of other  type, limited to breakdown of skin (HCC) 12/21/2017   Diverticulosis    Gout    Gram-negative bacteremia 05/11/2023   Heart murmur    History of osteomyelitis 05/31/2015   Hypercholesteremia    Hyperlipidemia    Hypertension    Hypotension due to hypovolemia 08/17/2022   Infection of left foot 08/19/2022   MSSA bacteremia 02/28/2023   Open wound of left foot with complication 05/10/2023   Osteomyelitis (HCC) 05/31/2015   Peripheral neuropathy    Postural dizziness with presyncope 02/26/2023   S/P BKA (below knee amputation) unilateral, 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   S/P transmetatarsal amputation of foot, left (HCC) 08/29/2022   Sepsis (HCC) 08/17/2022   Sepsis (HCC) 05/10/2023   Status post amputation of toe of right foot 11/01/2015   Tubular adenoma    Vitamin D  deficiency     Past Surgical History:  Procedure Laterality Date   ABDOMINAL AORTOGRAM W/LOWER EXTREMITY N/A 11/05/2023   Procedure: ABDOMINAL AORTOGRAM W/LOWER EXTREMITY;  Surgeon: Andrew Harding;  Location: [facility];  Service: Cardiovascular;  Laterality: N/A;   ACHILLES TENDON SURGERY Right 09/14/2023   Procedure: TENOTOMY, ACHILLES;  Surgeon: Andrew Harding;  Location: [facility];  Service: Orthopedics/Podiatry;  Laterality: Right;   AMPUTATION Left 08/19/2022   Procedure: TRANSMETATARSAL AMPUTATION LEFT FOOT WITH IRRIGATION AND DEBRIDEMENT;  Surgeon: Andrew Harding;  Location: [facility];  Service: Podiatry;  Laterality: Left;   AMPUTATION Left 05/16/2023   Procedure: AMPUTATION BELOW KNEE;  Surgeon: Andrew Harding;  Location: [facility];  Service: General;  Laterality: Left;   AMPUTATION Right 09/14/2023   Procedure: AMPUTATION, FOOT, RAY;  Surgeon: Andrew Harding;  Location: [facility];  Service: Orthopedics/Podiatry;  Laterality: Right;   AMPUTATION TOE Right 06/01/2015   Procedure: AMPUTATION TOE;  Surgeon: Andrew Harding;  Location: [facility];  Service: Podiatry;   Laterality: Right;   AMPUTATION TOE Left 08/11/2022   Procedure: AMPUTATION TOE 2, 3, 4;  Surgeon: Andrew Harding;  Location: [facility];  Service: Podiatry;  Laterality: Left;   CATARACT EXTRACTION, BILATERAL     CIRCUMCISION N/A 06/12/2022   Procedure: CIRCUMCISION ADULT;  Surgeon: Penne Knee, Harding;  Location: [facility];  Service: Urology;  Laterality: N/A;   COLONOSCOPY WITH PROPOFOL  N/A 02/14/2016   Procedure: COLONOSCOPY WITH PROPOFOL ;  Surgeon: Gladis RAYMOND Mariner, Harding;  Location: [facility] ENDOSCOPY;  Service: Endoscopy;  Laterality: N/A;   COLONOSCOPY WITH PROPOFOL  N/A 01/07/2019   Procedure: COLONOSCOPY WITH PROPOFOL ;  Surgeon: Mariner Gladis RAYMOND, Harding;  Location: [facility] ENDOSCOPY;  Service: Endoscopy;  Laterality: N/A;   CORONARY ARTERY BYPASS GRAFT N/A 08/2012   EXCISION PARTIAL PHALANX Right 06/01/2015   Procedure: EXCISION PARTIAL PHALANX /  BONE;  Surgeon: Andrew Harding;  Location: [facility];  Service: Podiatry;  Laterality: Right;   FLEXIBLE SIGMOIDOSCOPY N/A 05/29/2016   Procedure: FLEXIBLE SIGMOIDOSCOPY;  Surgeon: Gladis RAYMOND Mariner, Harding;  Location: [facility] ENDOSCOPY;  Service: Endoscopy;  Laterality: N/A;   INCISION AND DRAINAGE Left 08/23/2022  Procedure: INCISION AND DRAINAGE;  Surgeon: Andrew Harding;  Location: [facility];  Service: Podiatry;  Laterality: Left;   INCISION AND DRAINAGE OF WOUND Left 09/15/2022   Procedure: 11044 - DEBRIDE BONE and EXCISION IF 1ST METATARSAL BONE WITH  DELAY PRIMARY CLOSURE;  Surgeon: Andrew Harding;  Location: [facility];  Service: Podiatry;  Laterality: Left;   IRRIGATION AND DEBRIDEMENT FOOT Left 02/28/2023   Procedure: IRRIGATION AND DEBRIDEMENT FOOT;  Surgeon: Andrew Harding;  Location: [facility];  Service: Orthopedics/Podiatry;  Laterality: Left;   IRRIGATION AND DEBRIDEMENT FOOT Right 01/11/2024   Procedure: IRRIGATION AND DEBRIDEMENT FOOT;  Surgeon: Andrew Harding;  Location: [facility];  Service: Orthopedics/Podiatry;   Laterality: Right;   KNEE ARTHROSCOPY Left    LOWER EXTREMITY ANGIOGRAPHY Left 08/22/2022   Procedure: Lower Extremity Angiography;  Surgeon: Jama Cordella MATSU, Harding;  Location: [facility];  Service: Cardiovascular;  Laterality: Left;   LOWER EXTREMITY ANGIOGRAPHY Left 05/11/2023   Procedure: Lower Extremity Angiography;  Surgeon: Andrew Harding;  Location: [facility];  Service: Cardiovascular;  Laterality: Left;   LOWER EXTREMITY ANGIOGRAPHY Right 09/11/2023   Procedure: Lower Extremity Angiography;  Surgeon: Jama Cordella MATSU, Harding;  Location: [facility];  Service: Cardiovascular;  Laterality: Right;   LOWER EXTREMITY ANGIOGRAPHY Right 01/15/2024   Procedure: Lower Extremity Angiography;  Surgeon: Jama Cordella MATSU, Harding;  Location: [facility];  Service: Cardiovascular;  Laterality: Right;   LOWER EXTREMITY INTERVENTION Right 09/11/2023   Procedure: LOWER EXTREMITY INTERVENTION;  Surgeon: Jama Cordella MATSU, Harding;  Location: [facility];  Service: Cardiovascular;  Laterality: Right;   TEE WITHOUT CARDIOVERSION N/A 03/01/2023   Procedure: TRANSESOPHAGEAL ECHOCARDIOGRAM;  Surgeon: Alluri, Keller BROCKS, Harding;  Location: [facility];  Service: Cardiovascular;  Laterality: N/A;   TRANSMETATARSAL AMPUTATION Right 11/02/2023   Procedure: AMPUTATION, FOOT, TRANSMETATARSAL;  Surgeon: Andrew Harding;  Location: [facility];  Service: Orthopedics/Podiatry;  Laterality: Right;   TRANSMETATARSAL AMPUTATION Right 12/21/2023   Procedure: REVISION, AMPUTATION, FOOT, TRANSMETATARSAL;  Surgeon: Andrew Harding;  Location: [facility];  Service: Orthopedics/Podiatry;  Laterality: Right;   WOUND DEBRIDEMENT Left 09/15/2022   Procedure: 11043 - DEBRIDE SKIN. MUSCLE FASCIA;  Surgeon: Andrew Harding;  Location: [facility];  Service: Podiatry;  Laterality: Left;    Social History   Socioeconomic History   Marital status: Divorced    Spouse name: Andrew Harding,Angela C   Number of  children: Not on file   Years of education: Not on file   Highest education level: Not on file  Occupational History   Not on file  Tobacco Use   Smoking status: Former    Types: Cigars   Smokeless tobacco: Never  Vaping Use   Vaping status: Never Used  Substance and Sexual Activity   Alcohol use: No   Drug use: No   Sexual activity: Never  Other Topics Concern   Not on file  Social History Narrative   Patient currently resides at    [facility]   [address]  [place], [place]  [phone]      Patient is legally separated and lives at home by himself. His ex -wife is his HCPOA. Neighbors and friends are his support system. He used to work for [employer]    Social Drivers of Corporate investment banker Strain: Low Risk  (12/13/2022)   Received from [facility]   Overall Financial Resource Strain (CARDIA)  Difficulty of Paying Living Expenses: Not hard at all  Food Insecurity: No Food Insecurity (01/10/2024)   Hunger Vital Sign    Worried About Running Out of Food in the Last Year: Never true    Ran Out of Food in the Last Year: Never true  Transportation Needs: No Transportation Needs (01/10/2024)   PRAPARE - Administrator, Civil Service (Medical): No    Lack of Transportation (Non-Medical): No  Physical Activity: Not on file  Stress: Not on file  Social Connections: Moderately Isolated (01/10/2024)   Social Connection and Isolation Panel    Frequency of Communication with Friends and Family: More than three times a week    Frequency of Social Gatherings with Friends and Family: Twice a week    Attends Religious Services: More than 4 times per year    Active Member of [employer] or Organizations: No    Attends Banker Meetings: Never    Marital Status: Divorced  Catering manager Violence: Not At Risk (01/10/2024)   Humiliation, Afraid, Rape, and Kick questionnaire    Fear of Current or Ex-Partner: No     Emotionally Abused: No    Physically Abused: No    Sexually Abused: No    Family History  Problem Relation Age of Onset   Diabetes Mellitus II Mother    CAD Mother    No Known Allergies I? Current Outpatient Medications  Medication Sig Dispense Refill   acetaminophen  (TYLENOL ) 325 MG tablet Take 2 tablets (650 mg total) by mouth every 6 (six) hours as needed for mild pain (pain score 1-3) or fever (or Fever >/= 101). 30 tablet 0   aspirin  EC 81 MG tablet Take 1 tablet (81 mg total) by mouth daily. Swallow whole. 90 tablet 1   Continuous Glucose Sensor (FREESTYLE LIBRE 2 SENSOR) MISC      cyanocobalamin  (VITAMIN B12) 1000 MCG tablet Take 1 tablet (1,000 mcg total) by mouth daily.     Dulaglutide  (TRULICITY ) 0.75 MG/0.5ML SOAJ Inject 0.75 mg into the skin once a week. Every Monday     ELIQUIS  5 MG TABS tablet TAKE 1 TABLET(5 MG) BY MOUTH TWICE DAILY 60 tablet 2   ertapenem  (INVANZ ) IVPB Inject 1 g into the vein daily. Indication:  E cloacae diabetic foot infection and osteomyelitis First Dose: Yes Last Day of Therapy:  02/21/2024 Labs - Once weekly:  CBC/D, CMP, ESR and CRP Fax weekly lab results  promptly to [phone]  Method of administration: Mini-Bag Plus / Gravity Method of administration may be changed at the discretion of home infusion pharmacist based upon assessment of the patient and/or caregiver's ability to self-administer the medication ordered. Please pull PICC at completion of IV antibiotics Call [phone] with any questions or critical value 37 Units 0   gabapentin  (NEURONTIN ) 300 MG capsule Take 1 capsule (300 mg total) by mouth 3 (three) times daily. 90 capsule 0   glucose blood (ONETOUCH ULTRA) test strip Use 3 (three) times daily     insulin  glargine (LANTUS ) 100 UNIT/ML Solostar Pen Inject 28 Units into the skin at bedtime. (Patient taking differently: Inject 28 Units into the skin daily.)     Insulin  Pen Needle (FIFTY50 PEN NEEDLES) 32G X 4 MM MISC USE 4  TIMES DAILY     metFORMIN  (GLUCOPHAGE -XR) 500 MG 24 hr tablet Take 1,000 mg by mouth 2 (two) times daily with a meal.     metoprolol  tartrate (LOPRESSOR ) 25 MG tablet Take 0.5 tablets (12.5  mg total) by mouth 2 (two) times daily.     pravastatin  (PRAVACHOL ) 80 MG tablet Take 80 mg by mouth daily.     traZODone  (DESYREL ) 50 MG tablet Take 50 mg by mouth at bedtime.     No current facility-administered medications for this visit.     Abtx:  Anti-infectives (From admission, onward)    None       REVIEW OF SYSTEMS:  Const: negative fever, negative chills, negative weight loss Eyes: negative diplopia or visual changes, negative eye pain ENT: negative coryza, negative sore throat Resp: negative cough, hemoptysis, dyspnea Cards: negative for chest pain, palpitations, lower extremity edema GU: negative for frequency, dysuria and hematuria GI: Negative for abdominal pain, diarrhea, bleeding, constipation Skin: negative for rash and pruritus Heme: negative for easy bruising and gum/nose bleeding MS: as above Neurolo:negative for headaches, dizziness, vertigo, memory problems  Psych: in good spirits Planning to go back to work Dec 1st if he is cleared by podiatrist Endocrine: , diabetes Allergy/Immunology- negative for any medication or food allergies ?  Objective:  VITALS:  BP 120/71   Pulse 79   Temp (!) 97.1 F (36.2 C) (Temporal)   SpO2 99%  LDA Rt PICC PHYSICAL EXAM:  General: Alert, cooperative, no distress, Lungs: Clear to auscultation bilaterally. No Wheezing or Rhonchi. No rales. Heart: Regular rate and rhythm, no murmur, rub or gallop. Abdomen: did not examine Extremities: rt foot TMA site- much improvedexcept for the two ends 02/21/24- medial end      Lateral malleolus    Medial malleolus      10//2/25   01/08/24     12/13/23   11/29/23    11/20/23          Left leg BKA Prosthetic leg Skin: No rashes or lesions. Or  bruising Lymph: Cervical, supraclavicular normal. Neurologic: Grossly non-focal Pertinent Labs Lab Results from 02/13/24 CBC HB 11.2 CRP<1 ESR 19 Cr 1.27  ? Impression/Recommendation ?Diabetic foot infection with peripheral artery disease ,involving the right foot at the site of prior amputation of the great toe Underwent TMA on 11/02/2023 Culture MRSA / enterococcus fecalis Bone pathology acute osteo but margin clear. Completed 6 weeks of IV daptomycin  around 12/21/23 Some ischemic necrosis of the skin at the surgical site There was some dehiscence at the surgical site on the lateral margin Further debridement on 12/21/23- Enterobacter, pseudeschricia  and granulocutella- on levaquin and augmentin  01/03/24 culture sent by Dr.Fowler- enterobacter-MDR Admitted 9/11-9/17 further debridement of the site Bone biopsy was - ACTIVE AND CHRONIC OSTEOMYELITIS. Culture was Enterobacter and prevotella Discharged on ertapenem  to complete 6 weeks on 02/21/24 HE will complete today and I will discuss with Dr.Fowler tomorrow regarding any culture or further antibiotics Recent ESR and CRP N Doing better Couple of slit openings- may benefit from Prisma collagen packing- will discuss with Dr.Fowler   PAD Followed by vein and vascular On 11/05/2023 underwent angio and had mechanical thrombectomy of the right anterior tibial artery, balloon angioplasty of the right anterior tibial artery and stent placement of the same.  Angioplasty of the right distal popliteal artery, tibioperoneal trunk and stent placement in the right tibioperoneal trunk 01/15/24 angio and stent to anterior tibial and mid popliteal  The rt posterior tibial artery   had diffuse disease which is rather extensive in its midportion and then its distal one third is occluded and there is minimal if any reconstitution of the plantar arteries.   History of left BKA    CAD Status  post CABG   A-fib on  Eliquis    ?   ________________________________________________ Discussed with patient, and his friend-  Follow in 4 weeks

## 2024-02-25 ENCOUNTER — Telehealth: Payer: Self-pay

## 2024-02-25 NOTE — Telephone Encounter (Signed)
 Per Dr. Fayette patient picc line can be removed. LVM for Jenna with [facility] with orders. Also [facility] to [facility] [phone]

## 2024-02-26 ENCOUNTER — Ambulatory Visit: Admitting: Infectious Diseases

## 2024-03-02 ENCOUNTER — Encounter (INDEPENDENT_AMBULATORY_CARE_PROVIDER_SITE_OTHER): Payer: Self-pay | Admitting: Vascular Surgery

## 2024-03-02 NOTE — Progress Notes (Signed)
 MRN : [id number]  Andrew Harding is a 73 y.o. (August 04, 1950) male who presents with chief complaint of check circulation.  History of Present Illness:   The patient returns to the office for followup regarding atherosclerotic changes of the lower extremities and review of the noninvasive studies.   The wounds of his right foot are improving.  There have been no interval changes in lower extremity symptoms. No interval shortening of the patient's claudication distance or development of rest pain symptoms. No new ulcers or wounds have occurred since the last visit.  There have been no significant changes to the patient's overall health care.  ABI Rt=1.11 (triphasic) and Lt=BKA     Current Meds  Medication Sig   Continuous Glucose Sensor (FREESTYLE LIBRE 2 SENSOR) MISC    cyanocobalamin  (VITAMIN B12) 1000 MCG tablet Take 1 tablet (1,000 mcg total) by mouth daily.   Dulaglutide  (TRULICITY ) 0.75 MG/0.5ML SOAJ Inject 0.75 mg into the skin once a week. Every Monday   ELIQUIS  5 MG TABS tablet TAKE 1 TABLET(5 MG) BY MOUTH TWICE DAILY   [EXPIRED] ertapenem  (INVANZ ) IVPB Inject 1 g into the vein daily. 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 Please pull PICC at completion of IV antibiotics Call [phone] with any questions or critical value   gabapentin  (NEURONTIN ) 300 MG capsule Take 1 capsule (300 mg total) by mouth 3 (three) times daily.   glucose blood (ONETOUCH ULTRA) test strip Use 3 (three) times daily    insulin  glargine (LANTUS ) 100 UNIT/ML Solostar Pen Inject 28 Units into the skin at bedtime. (Patient taking differently: Inject 28 Units into the skin daily.)   metFORMIN  (GLUCOPHAGE -XR) 500 MG 24 hr tablet Take 1,000 mg by mouth 2 (two) times daily with a meal.   metoprolol  tartrate (LOPRESSOR ) 25 MG tablet Take 0.5 tablets (12.5 mg total) by mouth 2 (two) times daily.   pravastatin  (PRAVACHOL ) 80 MG tablet Take 80 mg by mouth daily.   traZODone  (DESYREL ) 50 MG tablet Take 50 mg by mouth at bedtime.    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) 09/03/2023   Atrial fibrillation with RVR (HCC) 08/19/2022   Bladder neck obstruction    Cellulitis 08/17/2022   Chronic kidney disease    Coronary artery disease    a.) s/p 4v CABG in 2014   Diabetes mellitus without complication (HCC)    Diabetic neuropathy (HCC)    Diabetic peripheral neuropathy (HCC)    Diabetic ulcer of toe of left foot associated with diabetes mellitus of other type, limited to breakdown  of skin (HCC) 12/21/2017   Diverticulosis    Gout    Gram-negative bacteremia 05/11/2023   Heart murmur    History of osteomyelitis 05/31/2015   Hypercholesteremia    Hyperlipidemia    Hypertension    Hypotension due to hypovolemia 08/17/2022   Infection of left foot 08/19/2022   MSSA bacteremia 02/28/2023   Open wound of left foot with complication 05/10/2023   Osteomyelitis (HCC) 05/31/2015   Peripheral neuropathy    Postural dizziness with presyncope 02/26/2023   S/P BKA (below knee amputation) unilateral, 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   S/P transmetatarsal amputation of foot, left (HCC) 08/29/2022   Sepsis (HCC) 08/17/2022   Sepsis (HCC) 05/10/2023   Status post amputation of toe of right foot 11/01/2015   Tubular adenoma    Vitamin D   deficiency     Past Surgical History:  Procedure Laterality Date   ABDOMINAL AORTOGRAM W/LOWER EXTREMITY N/A 11/05/2023   Procedure: ABDOMINAL AORTOGRAM W/LOWER EXTREMITY;  Surgeon: Marea Selinda RAMAN, MD;  Location: [facility];  Service: Cardiovascular;  Laterality: N/A;   ACHILLES TENDON SURGERY Right 09/14/2023   Procedure: TENOTOMY, ACHILLES;  Surgeon: [name], DPM;  Location: [facility];  Service: Orthopedics/Podiatry;  Laterality: Right;   AMPUTATION Left 08/19/2022   Procedure: TRANSMETATARSAL AMPUTATION LEFT FOOT WITH IRRIGATION AND DEBRIDEMENT;  Surgeon: Lennie Barter, DPM;  Location: [facility];  Service: Podiatry;  Laterality: Left;   AMPUTATION Left 05/16/2023   Procedure: AMPUTATION BELOW KNEE;  Surgeon: Marea Selinda RAMAN, MD;  Location: [facility];  Service: General;  Laterality: Left;   AMPUTATION Right 09/14/2023   Procedure: AMPUTATION, FOOT, RAY;  Surgeon: [name], DPM;  Location: [facility];  Service: Orthopedics/Podiatry;  Laterality: Right;   AMPUTATION TOE Right 06/01/2015   Procedure: AMPUTATION TOE;  Surgeon: Donnice Cory, DPM;  Location: [facility];  Service: Podiatry;  Laterality: Right;   AMPUTATION TOE Left 08/11/2022   Procedure: AMPUTATION TOE 2, 3, 4;  Surgeon: [name], DPM;  Location: [facility];  Service: Podiatry;  Laterality: Left;   CATARACT EXTRACTION, BILATERAL     CIRCUMCISION N/A 06/12/2022   Procedure: CIRCUMCISION ADULT;  Surgeon: Penne Knee, MD;  Location: [facility];  Service: Urology;  Laterality: N/A;   COLONOSCOPY WITH PROPOFOL  N/A 02/14/2016   Procedure: COLONOSCOPY WITH PROPOFOL ;  Surgeon: Gladis RAYMOND Mariner, MD;  Location: [facility];  Service: Endoscopy;  Laterality: N/A;   COLONOSCOPY WITH PROPOFOL  N/A 01/07/2019   Procedure: COLONOSCOPY WITH PROPOFOL ;  Surgeon: Mariner Gladis RAYMOND, MD;  Location: [facility];  Service: Endoscopy;  Laterality: N/A;   CORONARY ARTERY BYPASS GRAFT N/A 08/2012   EXCISION PARTIAL PHALANX Right  06/01/2015   Procedure: EXCISION PARTIAL PHALANX /  BONE;  Surgeon: Donnice Cory, DPM;  Location: [facility];  Service: Podiatry;  Laterality: Right;   FLEXIBLE SIGMOIDOSCOPY N/A 05/29/2016   Procedure: FLEXIBLE SIGMOIDOSCOPY;  Surgeon: Gladis RAYMOND Mariner, MD;  Location: [facility] ENDOSCOPY;  Service: Endoscopy;  Laterality: N/A;   INCISION AND DRAINAGE Left 08/23/2022   Procedure: INCISION AND DRAINAGE;  Surgeon: [name], DPM;  Location: [facility];  Service: Podiatry;  Laterality: Left;   INCISION AND DRAINAGE OF WOUND Left 09/15/2022   Procedure: 11044 - DEBRIDE BONE and EXCISION IF 1ST METATARSAL BONE WITH  DELAY PRIMARY CLOSURE;  Surgeon: [name], DPM;  Location: [facility];  Service: Podiatry;  Laterality: Left;   IRRIGATION AND DEBRIDEMENT FOOT Left 02/28/2023   Procedure: IRRIGATION AND DEBRIDEMENT FOOT;  Surgeon:  [name], DPM;  Location: [facility];  Service: Orthopedics/Podiatry;  Laterality: Left;   IRRIGATION AND DEBRIDEMENT FOOT Right 01/11/2024   Procedure: IRRIGATION AND DEBRIDEMENT FOOT;  Surgeon: [name], DPM;  Location: [facility];  Service: Orthopedics/Podiatry;  Laterality: Right;   KNEE ARTHROSCOPY Left    LOWER EXTREMITY ANGIOGRAPHY Left 08/22/2022   Procedure: Lower Extremity Angiography;  Surgeon: Jama Cordella MATSU, MD;  Location: [facility];  Service: Cardiovascular;  Laterality: Left;   LOWER EXTREMITY ANGIOGRAPHY Left 05/11/2023   Procedure: Lower Extremity Angiography;  Surgeon: Marea Selinda RAMAN, MD;  Location: [facility];  Service: Cardiovascular;  Laterality: Left;   LOWER EXTREMITY ANGIOGRAPHY Right 09/11/2023   Procedure: Lower Extremity Angiography;  Surgeon: Jama Cordella MATSU, MD;  Location: [facility];  Service: Cardiovascular;  Laterality: Right;   LOWER EXTREMITY ANGIOGRAPHY Right 01/15/2024   Procedure: Lower Extremity Angiography;  Surgeon: Jama Cordella MATSU, MD;  Location: [facility];  Service: Cardiovascular;   Laterality: Right;   LOWER EXTREMITY INTERVENTION Right 09/11/2023   Procedure: LOWER EXTREMITY INTERVENTION;  Surgeon: Jama Cordella MATSU, MD;  Location: [facility];  Service: Cardiovascular;  Laterality: Right;   TEE WITHOUT CARDIOVERSION N/A 03/01/2023   Procedure: TRANSESOPHAGEAL ECHOCARDIOGRAM;  Surgeon: Alluri, Keller BROCKS, MD;  Location: [facility];  Service: Cardiovascular;  Laterality: N/A;   TRANSMETATARSAL AMPUTATION Right 11/02/2023   Procedure: AMPUTATION, FOOT, TRANSMETATARSAL;  Surgeon: [name], DPM;  Location: [facility];  Service: Orthopedics/Podiatry;  Laterality: Right;   TRANSMETATARSAL AMPUTATION Right 12/21/2023   Procedure: REVISION, AMPUTATION, FOOT, TRANSMETATARSAL;  Surgeon: [name], DPM;  Location: [facility];  Service: Orthopedics/Podiatry;  Laterality: Right;   WOUND DEBRIDEMENT Left 09/15/2022   Procedure: 11043 - DEBRIDE SKIN. MUSCLE FASCIA;  Surgeon: [name], DPM;  Location: [facility];  Service: Podiatry;  Laterality: Left;    Social History Social History   Tobacco Use   Smoking status: Former    Types: Cigars   Smokeless tobacco: Never  Vaping Use   Vaping status: Never Used  Substance Use Topics   Alcohol use: No   Drug use: No    Family History Family History  Problem Relation Age of Onset   Diabetes Mellitus II Mother    CAD Mother     No Known Allergies   REVIEW OF SYSTEMS (Negative unless checked)  Constitutional: [] Weight loss  [] Fever  [] Chills Cardiac: [] Chest pain   [] Chest pressure   [] Palpitations   [] Shortness of breath when laying flat   [] Shortness of breath with exertion. Vascular:  [x] Pain in legs with walking   [] Pain in legs at rest  [] History of DVT   [] Phlebitis   [] Swelling in legs   [] Varicose veins   [] Non-healing ulcers Pulmonary:   [] Uses home oxygen   [] Productive cough   [] Hemoptysis   [] Wheeze  [] COPD   [] Asthma Neurologic:  [] Dizziness   [] Seizures   [] History of stroke   [] History of TIA   [] Aphasia   [] Vissual changes   [] Weakness or numbness in arm   [] Weakness or numbness in leg Musculoskeletal:   [] Joint swelling   [] Joint pain   [] Low back pain Hematologic:  [] Easy bruising  [] Easy bleeding   [] Hypercoagulable state   [] Anemic Gastrointestinal:  [] Diarrhea   [] Vomiting  [] Gastroesophageal reflux/heartburn   [] Difficulty swallowing. Genitourinary:  [] Chronic kidney disease   [] Difficult urination  [] Frequent urination   [] Blood in urine Skin:  [] Rashes   [] Ulcers  Psychological:  [] History of  anxiety   []  History of major depression.  Physical Examination  Vitals:   02/21/24 1512  BP: 97/63  Pulse: 82  Resp: 18  Weight: 180 lb 3.2 oz (81.7 kg)  Height: 6' 3 (1.905 m)   Body mass index is 22.52 kg/m. Gen: WD/WN, NAD Head: [place]/AT, No temporalis wasting.  Ear/Nose/Throat: Hearing grossly intact, nares w/o erythema or drainage Eyes: PER, EOMI, sclera nonicteric.  Neck: Supple, no masses.  No bruit or JVD.  Pulmonary:  Good air movement, no audible wheezing, no use of accessory muscles.  Cardiac: RRR, normal S1, S2, no Murmurs. Vascular:  mild trophic changes, open wounds appear improved Vessel Right Left  Radial Palpable Palpable  PT Not Palpable BKA  DP Not Palpable BKA  Gastrointestinal: soft, non-distended. No guarding/no peritoneal signs.  Musculoskeletal: M/S 5/5 throughout.  No visible deformity.  Neurologic: CN 2-12 intact. Pain and light touch intact in extremities.  Symmetrical.  Speech is fluent. Motor exam as listed above. Psychiatric: Judgment intact, Mood & affect appropriate for pt's clinical situation. Dermatologic: No rashes or ulcers noted.  No changes consistent with cellulitis.   CBC Lab Results  Component Value Date   WBC 3.7 (L) 01/16/2024   HGB 8.9 (L) 01/16/2024   HCT 27.9 (L) 01/16/2024   MCV 88.3 01/16/2024   PLT 121 (L) 01/16/2024    BMET    Component Value Date/Time   NA 137 01/13/2024 0457   K 3.6 01/13/2024 0457   CL  108 01/13/2024 0457   CO2 22 01/13/2024 0457   GLUCOSE 115 (H) 01/13/2024 0457   GLUCOSE 402 (H) 09/17/2012 1417   BUN 14 01/13/2024 0457   CREATININE 1.26 (H) 01/13/2024 0457   CALCIUM 8.4 (L) 01/13/2024 0457   GFRNONAA 60 (L) 01/13/2024 0457   GFRAA 58 (L) 06/02/2015 0656   CrCl cannot be calculated (Patient's most recent lab result is older than the maximum 21 days allowed.).  COAG Lab Results  Component Value Date   INR 1.2 01/12/2024   INR 1.3 (H) 05/10/2023   INR 1.6 (H) 02/27/2023    Radiology VAS US  ABI WITH/WO TBI Result Date: 02/21/2024  LOWER EXTREMITY DOPPLER STUDY Patient Name:  KAIO KUHLMAN  Date of Exam:   02/21/2024 Medical Rec #: [id number]    Accession #:    [id number] Date of Birth: 05/12/1950     Patient Gender: M Patient Age:   79 years Exam Location:  [facility] Procedure:      VAS US  ABI WITH/WO TBI Referring Phys: [facility] --------------------------------------------------------------------------------  Indications: Peripheral artery disease. High Risk Factors: Hypertension, hyperlipidemia, coronary artery disease.  Vascular Interventions: 01/15/2024 PTA and stent right pop and right anterior                         tibial arteries                          11/05/2023 Right ATA stent and PO/ Tib per PTA                          Left AT PTA 05/11/2023                         Left BKA 05/16/2023  09/11/2023: PTA Rt Mid Popliteal Artery. PTA and Stent                         Rt ATA. PTA Rt Tibioperoneal trunk. Performing Technologist: Donnice Charnley RVT  Examination Guidelines: A complete evaluation includes at minimum, Doppler waveform signals and systolic blood pressure reading at the level of bilateral brachial, anterior tibial, and posterior tibial arteries, when vessel segments are accessible. Bilateral testing is considered an integral part of a complete examination. Photoelectric Plethysmograph (PPG) waveforms and toe systolic  pressure readings are included as required and additional duplex testing as needed. Limited examinations for reoccurring indications may be performed as noted.  ABI Findings: +---------+------------------+-----+----------+--------------------+ Right    Rt Pressure (mmHg)IndexWaveform  Comment              +---------+------------------+-----+----------+--------------------+ Brachial                                  PICC line            +---------+------------------+-----+----------+--------------------+ PTA      134               1.11 triphasic                      +---------+------------------+-----+----------+--------------------+ PERO     125               1.03 hyperemic                      +---------+------------------+-----+----------+--------------------+ DP       125               1.03 monophasic                     +---------+------------------+-----+----------+--------------------+ Great Toe                                 Trans met amputation +---------+------------------+-----+----------+--------------------+ +--------+------------------+-----+--------+-------+ Left    Lt Pressure (mmHg)IndexWaveformComment +--------+------------------+-----+--------+-------+ [id number]                                    +--------+------------------+-----+--------+-------+ +-------+-----------+-----------+----------------+------------+ ABI/TBIToday's ABIToday's TBIPrevious ABI    Previous TBI +-------+-----------+-----------+----------------+------------+ Right  1.03                  Non compressible             +-------+-----------+-----------+----------------+------------+ Left   BKA                                                +-------+-----------+-----------+----------------+------------+  Tibial arteries were non compressible at the ankle on the last exam (11/27/2023).  Summary: Right: Resting right ankle-brachial index is within normal range.  *See  table(s) above for measurements and observations.  Electronically signed by Cordella Shawl MD on 02/21/2024 at 5:20:05 PM.    Final      Assessment/Plan 1. Atherosclerosis of native arteries of the extremities with ulceration (HCC) (Primary) Recommend:  The patient is status post successful angiogram with intervention.  The patient reports that the claudication symptoms and leg pain  has improved.   The patient denies lifestyle limiting changes at this point in time.  No further invasive studies, angiography or surgery at this time. The patient should continue walking and begin a more formal exercise program.  The patient should continue antiplatelet therapy and aggressive treatment of the lipid abnormalities  Continued surveillance is indicated as atherosclerosis is likely to progress with time.    Patient should undergo noninvasive studies as ordered. The patient will follow up with me to review the studies.  - VAS US  ABI WITH/WO TBI; Future  2. Longstanding persistent atrial fibrillation (HCC) Continue antiarrhythmia medications as already ordered, these medications have been reviewed and there are no changes at this time.  Continue anticoagulation as ordered by Cardiology Service  3. Benign essential hypertension Continue antihypertensive medications as already ordered, these medications have been reviewed and there are no changes at this time.  4. Type 2 diabetes mellitus with stage 3b chronic kidney disease, with long-term current use of insulin  (HCC) Continue hypoglycemic medications as already ordered, these medications have been reviewed and there are no changes at this time.  Hgb A1C to be monitored as already arranged by primary service    Cordella Shawl, MD  03/02/2024 11:30 AM

## 2024-03-03 ENCOUNTER — Other Ambulatory Visit (INDEPENDENT_AMBULATORY_CARE_PROVIDER_SITE_OTHER): Payer: Self-pay | Admitting: Nurse Practitioner

## 2024-03-06 ENCOUNTER — Telehealth (INDEPENDENT_AMBULATORY_CARE_PROVIDER_SITE_OTHER): Payer: Self-pay

## 2024-03-06 ENCOUNTER — Other Ambulatory Visit (INDEPENDENT_AMBULATORY_CARE_PROVIDER_SITE_OTHER): Payer: Self-pay | Admitting: Nurse Practitioner

## 2024-03-06 DIAGNOSIS — Z9889 Other specified postprocedural states: Secondary | ICD-10-CM

## 2024-03-06 NOTE — Telephone Encounter (Signed)
 Patient called into nurse line stating he would like a return call for scheduling. Please advise

## 2024-03-06 NOTE — Telephone Encounter (Signed)
 Per front desk appt has been scheduled for 11/7 @ 3

## 2024-03-07 ENCOUNTER — Ambulatory Visit (INDEPENDENT_AMBULATORY_CARE_PROVIDER_SITE_OTHER)

## 2024-03-07 ENCOUNTER — Ambulatory Visit (INDEPENDENT_AMBULATORY_CARE_PROVIDER_SITE_OTHER): Admitting: Nurse Practitioner

## 2024-03-07 ENCOUNTER — Encounter (INDEPENDENT_AMBULATORY_CARE_PROVIDER_SITE_OTHER): Payer: Self-pay | Admitting: Nurse Practitioner

## 2024-03-07 VITALS — BP 136/79 | HR 95 | Resp 18

## 2024-03-07 DIAGNOSIS — Z9889 Other specified postprocedural states: Secondary | ICD-10-CM | POA: Diagnosis not present

## 2024-03-07 DIAGNOSIS — I739 Peripheral vascular disease, unspecified: Secondary | ICD-10-CM | POA: Diagnosis not present

## 2024-03-07 DIAGNOSIS — I10 Essential (primary) hypertension: Secondary | ICD-10-CM

## 2024-03-07 DIAGNOSIS — E782 Mixed hyperlipidemia: Secondary | ICD-10-CM | POA: Diagnosis not present

## 2024-03-07 DIAGNOSIS — I7025 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: Secondary | ICD-10-CM

## 2024-03-08 ENCOUNTER — Encounter (INDEPENDENT_AMBULATORY_CARE_PROVIDER_SITE_OTHER): Payer: Self-pay | Admitting: Nurse Practitioner

## 2024-03-08 NOTE — H&P (View-Only) (Signed)
 Subjective:    Patient ID: Andrew Harding, male    DOB: 17-Jun-1950, 73 y.o.   MRN: [id number] Chief Complaint  Patient presents with   Follow-up    Ref Ashley consult abi and right arterial duplex    The patient presents today for follow-up started regarding his peripheral arterial disease.  He has a wound on his right lower extremity which has been very slow to heal.  He has been getting excellent wound.  Podiatry but despite these he has not shown significant improvement.  He has podiatrist is hoping that we will be able to do hyperbaric oxygen therapy to try to help heal the wound.  The patient has a history of a left below-knee amputation.  He had a recent intervention on the right lower extremity on 01/15/2024 angioplasty and stent placement to the right popliteal and anterior tibial arteries.  Today having noninvasive studies which shows a noncompressible ABI on the right.  Arterial duplex shows primarily triphasic waveforms throughout the right lower extremity but monophasic waveforms in the tibial vessels.    Review of Systems  Musculoskeletal:  Positive for gait problem.  Skin:  Positive for wound.  All other systems reviewed and are negative.      Objective:   Physical Exam Vitals reviewed.  HENT:     Head: Normocephalic.  Cardiovascular:     Rate and Rhythm: Normal rate.  Pulmonary:     Effort: Pulmonary effort is normal.  Musculoskeletal:     Left Lower Extremity: Left leg is amputated below knee.  Skin:    General: Skin is warm and dry.  Neurological:     Mental Status: He is alert and oriented to person, place, and time.     Gait: Gait abnormal.  Psychiatric:        Mood and Affect: Mood normal.        Behavior: Behavior normal.        Thought Content: Thought content normal.        Judgment: Judgment normal.     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) 09/03/2023   Atrial fibrillation with RVR (HCC) 08/19/2022   Bladder neck obstruction    Cellulitis 08/17/2022   Chronic kidney disease    Coronary artery disease    a.) s/p 4v CABG in 2014   Diabetes mellitus without complication (HCC)    Diabetic neuropathy (HCC)    Diabetic peripheral neuropathy (HCC)    Diabetic ulcer of toe of left foot associated with diabetes mellitus of other type, limited to breakdown of skin (HCC) 12/21/2017   Diverticulosis    Gout    Gram-negative bacteremia 05/11/2023   Heart murmur    History of osteomyelitis 05/31/2015   Hypercholesteremia    Hyperlipidemia    Hypertension    Hypotension due to hypovolemia 08/17/2022   Infection of left foot 08/19/2022   MSSA bacteremia 02/28/2023   Open wound of left foot with complication 05/10/2023   Osteomyelitis (HCC) 05/31/2015   Peripheral neuropathy    Postural dizziness with presyncope 02/26/2023   S/P BKA (below knee amputation) unilateral, 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   S/P transmetatarsal amputation of foot, left (HCC) 08/29/2022  Sepsis (HCC) 08/17/2022   Sepsis (HCC) 05/10/2023   Status post amputation of toe of right foot 11/01/2015   Tubular adenoma    Vitamin D  deficiency     Social History   Socioeconomic History   Marital status: Divorced    Spouse name: Ashley,Angela C   Number of children: Not on file   Years of education: Not on file   Highest education level: Not on file  Occupational History   Not on file  Tobacco Use   Smoking status: Former    Types: Cigars   Smokeless tobacco: Never  Vaping Use   Vaping status: Never Used  Substance and Sexual Activity   Alcohol use: No   Drug use: No   Sexual activity: Never  Other Topics Concern   Not on file  Social History Narrative   Patient currently resides at    [facility]   [address]  [place], [place]  [phone]      Patient is legally separated and lives at home by himself. His ex -wife is his HCPOA. Neighbors and friends are his support system. He used to work for [employer]    Social Drivers of Corporate Investment Banker Strain: Low Risk  (02/22/2024)   Received from [facility]   Overall Financial Resource Strain (CARDIA)    Difficulty of Paying Living Expenses: Not hard at all  Food Insecurity: No Food Insecurity (02/22/2024)   [facility]   Hunger Vital Sign    Within the past 12 months, you worried that your food would run out before you got the money to buy more.: Never true    Within the past 12 months, the food you bought just didn't last and you didn't have money to get more.: Never true  Transportation Needs: No Transportation Needs (02/22/2024)   Received from [facility] - Transportation    In the past 12 months, has lack of transportation kept you from medical appointments or from getting medications?: No    Lack of Transportation (Non-Medical): No  Physical Activity: Not on file  Stress: Not on file  Social Connections: Moderately Isolated (01/10/2024)   Social Connection and Isolation Panel    Frequency of Communication with Friends and Family: More than three times a week    Frequency of Social Gatherings with Friends and Family: Twice a week    Attends Religious Services: More than 4 times per year    Active Member of [employer] or Organizations: No    Attends Banker Meetings: Never    Marital Status: Divorced  Catering Manager Violence: Not At Risk (01/10/2024)   Humiliation, Afraid, Rape, and Kick questionnaire    Fear of Current or Ex-Partner: No    Emotionally Abused: No    Physically Abused: No    Sexually Abused: No    Past Surgical History:  Procedure Laterality Date   ABDOMINAL AORTOGRAM W/LOWER  EXTREMITY N/A 11/05/2023   Procedure: ABDOMINAL AORTOGRAM W/LOWER EXTREMITY;  Surgeon: Marea Selinda RAMAN, MD;  Location: [facility];  Service: Cardiovascular;  Laterality: N/A;   ACHILLES TENDON SURGERY Right 09/14/2023   Procedure: TENOTOMY, ACHILLES;  Surgeon: [name], DPM;  Location: [facility];  Service: Orthopedics/Podiatry;  Laterality: Right;   AMPUTATION Left 08/19/2022   Procedure: TRANSMETATARSAL AMPUTATION LEFT FOOT WITH IRRIGATION AND DEBRIDEMENT;  Surgeon: Lennie Barter, DPM;  Location: [facility];  Service: Podiatry;  Laterality: Left;   AMPUTATION Left 05/16/2023   Procedure: AMPUTATION BELOW KNEE;  Surgeon: Marea Selinda RAMAN, MD;  Location: [facility];  Service: General;  Laterality: Left;   AMPUTATION Right 09/14/2023   Procedure: AMPUTATION, FOOT, RAY;  Surgeon: [name], DPM;  Location: [facility];  Service: Orthopedics/Podiatry;  Laterality: Right;   AMPUTATION TOE Right 06/01/2015   Procedure: AMPUTATION TOE;  Surgeon: Donnice Cory, DPM;  Location: [facility];  Service: Podiatry;  Laterality: Right;   AMPUTATION TOE Left 08/11/2022   Procedure: AMPUTATION TOE 2, 3, 4;  Surgeon: [name], DPM;  Location: [facility];  Service: Podiatry;  Laterality: Left;   CATARACT EXTRACTION, BILATERAL     CIRCUMCISION N/A 06/12/2022   Procedure: CIRCUMCISION ADULT;  Surgeon: Penne Knee, MD;  Location: [facility];  Service: Urology;  Laterality: N/A;   COLONOSCOPY WITH PROPOFOL  N/A 02/14/2016   Procedure: COLONOSCOPY WITH PROPOFOL ;  Surgeon: Gladis RAYMOND Mariner, MD;  Location: [facility] ENDOSCOPY;  Service: Endoscopy;  Laterality: N/A;   COLONOSCOPY WITH PROPOFOL  N/A 01/07/2019   Procedure: COLONOSCOPY WITH PROPOFOL ;  Surgeon: Mariner Gladis RAYMOND, MD;  Location: [facility] ENDOSCOPY;  Service: Endoscopy;  Laterality: N/A;   CORONARY ARTERY BYPASS GRAFT N/A 08/2012   EXCISION PARTIAL PHALANX Right 06/01/2015   Procedure: EXCISION PARTIAL PHALANX /  BONE;  Surgeon: Donnice Cory, DPM;  Location:  [facility];  Service: Podiatry;  Laterality: Right;   FLEXIBLE SIGMOIDOSCOPY N/A 05/29/2016   Procedure: FLEXIBLE SIGMOIDOSCOPY;  Surgeon: Gladis RAYMOND Mariner, MD;  Location: [facility]OSCOPY;  Service: Endoscopy;  Laterality: N/A;   INCISION AND DRAINAGE Left 08/23/2022   Procedure: INCISION AND DRAINAGE;  Surgeon: [name], DPM;  Location: [facility];  Service: Podiatry;  Laterality: Left;   INCISION AND DRAINAGE OF WOUND Left 09/15/2022   Procedure: 11044 - DEBRIDE BONE and EXCISION IF 1ST METATARSAL BONE WITH  DELAY PRIMARY CLOSURE;  Surgeon: [name], DPM;  Location: [facility];  Service: Podiatry;  Laterality: Left;   IRRIGATION AND DEBRIDEMENT FOOT Left 02/28/2023   Procedure: IRRIGATION AND DEBRIDEMENT FOOT;  Surgeon: [name], DPM;  Location: [facility];  Service: Orthopedics/Podiatry;  Laterality: Left;   IRRIGATION AND DEBRIDEMENT FOOT Right 01/11/2024   Procedure: IRRIGATION AND DEBRIDEMENT FOOT;  Surgeon: [name], DPM;  Location: [facility];  Service: Orthopedics/Podiatry;  Laterality: Right;   KNEE ARTHROSCOPY Left    LOWER EXTREMITY ANGIOGRAPHY Left 08/22/2022   Procedure: Lower Extremity Angiography;  Surgeon: Jama Cordella MATSU, MD;  Location: [facility];  Service: Cardiovascular;  Laterality: Left;   LOWER EXTREMITY ANGIOGRAPHY Left 05/11/2023   Procedure: Lower Extremity Angiography;  Surgeon: Marea Selinda RAMAN, MD;  Location: [facility];  Service: Cardiovascular;  Laterality: Left;   LOWER EXTREMITY ANGIOGRAPHY Right 09/11/2023   Procedure: Lower Extremity Angiography;  Surgeon: Jama Cordella MATSU, MD;  Location: [facility];  Service: Cardiovascular;  Laterality: Right;   LOWER EXTREMITY ANGIOGRAPHY Right 01/15/2024   Procedure: Lower Extremity Angiography;  Surgeon: Jama Cordella MATSU, MD;  Location: [facility];  Service: Cardiovascular;  Laterality: Right;   LOWER EXTREMITY INTERVENTION Right 09/11/2023   Procedure: LOWER EXTREMITY  INTERVENTION;  Surgeon: Jama Cordella MATSU, MD;  Location: [facility];  Service: Cardiovascular;  Laterality: Right;   TEE WITHOUT CARDIOVERSION N/A 03/01/2023   Procedure: TRANSESOPHAGEAL ECHOCARDIOGRAM;  Surgeon: Alluri, Keller BROCKS, MD;  Location: [facility];  Service: Cardiovascular;  Laterality: N/A;   TRANSMETATARSAL AMPUTATION Right 11/02/2023   Procedure: AMPUTATION, FOOT, TRANSMETATARSAL;  Surgeon: [name], DPM;  Location: [facility];  Service: Orthopedics/Podiatry;  Laterality: Right;   TRANSMETATARSAL AMPUTATION Right 12/21/2023   Procedure: REVISION, AMPUTATION, FOOT, TRANSMETATARSAL;  Surgeon: [name], DPM;  Location: [facility];  Service: Orthopedics/Podiatry;  Laterality: Right;   WOUND DEBRIDEMENT Left 09/15/2022   Procedure: 11043 - DEBRIDE SKIN. MUSCLE FASCIA;  Surgeon: [name], DPM;  Location: [facility];  Service: Podiatry;  Laterality: Left;    Family History  Problem Relation Age of Onset   Diabetes Mellitus II Mother    CAD Mother     No Known Allergies     Latest Ref Rng & Units 01/16/2024    3:11 AM 01/15/2024    4:54 AM 01/14/2024    3:45 AM  CBC  WBC 4.0 - 10.5 K/uL 3.7  4.8  7.4   Hemoglobin 13.0 - 17.0 g/dL 8.9  9.2  89.9   Hematocrit 39.0 - 52.0 % 27.9  28.8  31.3   Platelets 150 - 400 K/uL 121  131  145       CMP     Component Value Date/Time   NA 137 01/13/2024 0457   K 3.6 01/13/2024 0457   CL 108 01/13/2024 0457   CO2 22 01/13/2024 0457   GLUCOSE 115 (H) 01/13/2024 0457   GLUCOSE 402 (H) 09/17/2012 1417   BUN 14 01/13/2024 0457   CREATININE 1.26 (H) 01/13/2024 0457   CALCIUM 8.4 (L) 01/13/2024 0457   PROT 6.9 05/11/2023 0501   ALBUMIN 3.1 (L) 05/11/2023 0501   AST 26 05/11/2023 0501   ALT 19 05/11/2023 0501   ALKPHOS 52 05/11/2023 0501   BILITOT 0.8 05/11/2023 0501   EGFR 58.0 12/10/2023 1443   GFRNONAA 60 (L) 01/13/2024 0457     VAS US  ABI WITH/WO TBI Result Date: 02/21/2024  LOWER EXTREMITY DOPPLER STUDY  Patient Name:  ALANMICHAEL BARMORE  Date of Exam:   02/21/2024 Medical Rec #: [id number]    Accession #:    [id number] Date of Birth: 06/10/1950     Patient Gender: M Patient Age:   32 years Exam Location:  [facility] Procedure:      VAS US  ABI WITH/WO TBI Referring Phys: GREGORY SCHNIER --------------------------------------------------------------------------------  Indications: Peripheral artery disease. High Risk Factors: Hypertension, hyperlipidemia, coronary artery disease.  Vascular Interventions: 01/15/2024 PTA and stent right pop and right anterior                         tibial arteries                          11/05/2023 Right ATA stent and PO/ Tib per PTA                          Left AT PTA 05/11/2023                         Left BKA 05/16/2023                          09/11/2023: PTA Rt Mid Popliteal Artery. PTA and Stent                         Rt ATA. PTA Rt Tibioperoneal trunk. Performing Technologist: Donnice Charnley RVT  Examination Guidelines: A complete evaluation includes  at minimum, Doppler waveform signals and systolic blood pressure reading at the level of bilateral brachial, anterior tibial, and posterior tibial arteries, when vessel segments are accessible. Bilateral testing is considered an integral part of a complete examination. Photoelectric Plethysmograph (PPG) waveforms and toe systolic pressure readings are included as required and additional duplex testing as needed. Limited examinations for reoccurring indications may be performed as noted.  ABI Findings: +---------+------------------+-----+----------+--------------------+ Right    Rt Pressure (mmHg)IndexWaveform  Comment              +---------+------------------+-----+----------+--------------------+ Brachial                                  PICC line            +---------+------------------+-----+----------+--------------------+ PTA      134               1.11 triphasic                       +---------+------------------+-----+----------+--------------------+ PERO     125               1.03 hyperemic                      +---------+------------------+-----+----------+--------------------+ DP       125               1.03 monophasic                     +---------+------------------+-----+----------+--------------------+ Great Toe                                 Trans met amputation +---------+------------------+-----+----------+--------------------+ +--------+------------------+-----+--------+-------+ Left    Lt Pressure (mmHg)IndexWaveformComment +--------+------------------+-----+--------+-------+ [id number]                                    +--------+------------------+-----+--------+-------+ +-------+-----------+-----------+----------------+------------+ ABI/TBIToday's ABIToday's TBIPrevious ABI    Previous TBI +-------+-----------+-----------+----------------+------------+ Right  1.03                  Non compressible             +-------+-----------+-----------+----------------+------------+ Left   BKA                                                +-------+-----------+-----------+----------------+------------+  Tibial arteries were non compressible at the ankle on the last exam (11/27/2023).  Summary: Right: Resting right ankle-brachial index is within normal range.  *See table(s) above for measurements and observations.  Electronically signed by Cordella Shawl MD on 02/21/2024 at 5:20:05 PM.    Final        Assessment & Plan:   1. Atherosclerosis of native arteries of the extremities with ulceration (HCC) (Primary)  Recommend:  The patient has evidence of severe atherosclerotic changes of both lower extremities associated with ulceration and tissue loss of the right foot.  This represents a limb threatening ischemia and places the patient at the risk for right limb loss.  Patient should undergo angiography of the right lower extremity  with the hope for intervention for limb salvage.  The risks and benefits as  well as the alternative therapies was discussed in detail with the patient.  All questions were answered.  Patient agrees to proceed with right angiography.  The patient will follow up with me in the office after the procedure.   2. Benign essential hypertension Continue antihypertensive medications as already ordered, these medications have been reviewed and there are no changes at this time.  3. Mixed hyperlipidemia Continue statin as ordered and reviewed, no changes at this time   Current Outpatient Medications on File Prior to Visit  Medication Sig Dispense Refill   Continuous Glucose Sensor (FREESTYLE LIBRE 2 SENSOR) MISC      cyanocobalamin  (VITAMIN B12) 1000 MCG tablet Take 1 tablet (1,000 mcg total) by mouth daily.     Dulaglutide  (TRULICITY ) 0.75 MG/0.5ML SOAJ Inject 0.75 mg into the skin once a week. Every Monday     ELIQUIS  5 MG TABS tablet TAKE 1 TABLET(5 MG) BY MOUTH TWICE DAILY 60 tablet 2   gabapentin  (NEURONTIN ) 300 MG capsule Take 1 capsule (300 mg total) by mouth 3 (three) times daily. 90 capsule 0   glucose blood (ONETOUCH ULTRA) test strip Use 3 (three) times daily     insulin  glargine (LANTUS ) 100 UNIT/ML Solostar Pen Inject 28 Units into the skin at bedtime. (Patient taking differently: Inject 28 Units into the skin daily.)     metFORMIN  (GLUCOPHAGE -XR) 500 MG 24 hr tablet Take 1,000 mg by mouth 2 (two) times daily with a meal.     metoprolol  tartrate (LOPRESSOR ) 25 MG tablet Take 0.5 tablets (12.5 mg total) by mouth 2 (two) times daily.     pravastatin  (PRAVACHOL ) 80 MG tablet Take 80 mg by mouth daily.     traZODone  (DESYREL ) 50 MG tablet Take 50 mg by mouth at bedtime.     acetaminophen  (TYLENOL ) 325 MG tablet Take 2 tablets (650 mg total) by mouth every 6 (six) hours as needed for mild pain (pain score 1-3) or fever (or Fever >/= 101). (Patient not taking: Reported on 03/07/2024) 30 tablet 0    aspirin  EC 81 MG tablet Take 1 tablet (81 mg total) by mouth daily. Swallow whole. (Patient not taking: Reported on 03/07/2024) 90 tablet 1   Insulin  Pen Needle (FIFTY50 PEN NEEDLES) 32G X 4 MM MISC USE 4 TIMES DAILY (Patient not taking: Reported on 03/07/2024)     No current facility-administered medications on file prior to visit.    There are no Patient Instructions on file for this visit. No follow-ups on file.   Severo Beber E Raeanne Deschler, NP

## 2024-03-08 NOTE — Progress Notes (Signed)
 Subjective:    Patient ID: Andrew Harding, male    DOB: 17-Jun-1950, 73 y.o.   MRN: [id number] Chief Complaint  Patient presents with   Follow-up    Ref Ashley consult abi and right arterial duplex    The patient presents today for follow-up started regarding his peripheral arterial disease.  He has a wound on his right lower extremity which has been very slow to heal.  He has been getting excellent wound.  Podiatry but despite these he has not shown significant improvement.  He has podiatrist is hoping that we will be able to do hyperbaric oxygen therapy to try to help heal the wound.  The patient has a history of a left below-knee amputation.  He had a recent intervention on the right lower extremity on 01/15/2024 angioplasty and stent placement to the right popliteal and anterior tibial arteries.  Today having noninvasive studies which shows a noncompressible ABI on the right.  Arterial duplex shows primarily triphasic waveforms throughout the right lower extremity but monophasic waveforms in the tibial vessels.    Review of Systems  Musculoskeletal:  Positive for gait problem.  Skin:  Positive for wound.  All other systems reviewed and are negative.      Objective:   Physical Exam Vitals reviewed.  HENT:     Head: Normocephalic.  Cardiovascular:     Rate and Rhythm: Normal rate.  Pulmonary:     Effort: Pulmonary effort is normal.  Musculoskeletal:     Left Lower Extremity: Left leg is amputated below knee.  Skin:    General: Skin is warm and dry.  Neurological:     Mental Status: He is alert and oriented to person, place, and time.     Gait: Gait abnormal.  Psychiatric:        Mood and Affect: Mood normal.        Behavior: Behavior normal.        Thought Content: Thought content normal.        Judgment: Judgment normal.     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) 09/03/2023   Atrial fibrillation with RVR (HCC) 08/19/2022   Bladder neck obstruction    Cellulitis 08/17/2022   Chronic kidney disease    Coronary artery disease    a.) s/p 4v CABG in 2014   Diabetes mellitus without complication (HCC)    Diabetic neuropathy (HCC)    Diabetic peripheral neuropathy (HCC)    Diabetic ulcer of toe of left foot associated with diabetes mellitus of other type, limited to breakdown of skin (HCC) 12/21/2017   Diverticulosis    Gout    Gram-negative bacteremia 05/11/2023   Heart murmur    History of osteomyelitis 05/31/2015   Hypercholesteremia    Hyperlipidemia    Hypertension    Hypotension due to hypovolemia 08/17/2022   Infection of left foot 08/19/2022   MSSA bacteremia 02/28/2023   Open wound of left foot with complication 05/10/2023   Osteomyelitis (HCC) 05/31/2015   Peripheral neuropathy    Postural dizziness with presyncope 02/26/2023   S/P BKA (below knee amputation) unilateral, 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   S/P transmetatarsal amputation of foot, left (HCC) 08/29/2022  Sepsis (HCC) 08/17/2022   Sepsis (HCC) 05/10/2023   Status post amputation of toe of right foot 11/01/2015   Tubular adenoma    Vitamin D  deficiency     Social History   Socioeconomic History   Marital status: Divorced    Spouse name: Ashley,Angela C   Number of children: Not on file   Years of education: Not on file   Highest education level: Not on file  Occupational History   Not on file  Tobacco Use   Smoking status: Former    Types: Cigars   Smokeless tobacco: Never  Vaping Use   Vaping status: Never Used  Substance and Sexual Activity   Alcohol use: No   Drug use: No   Sexual activity: Never  Other Topics Concern   Not on file  Social History Narrative   Patient currently resides at    [facility]   [address]  [place], [place]  [phone]      Patient is legally separated and lives at home by himself. His ex -wife is his HCPOA. Neighbors and friends are his support system. He used to work for [employer]    Social Drivers of Corporate Investment Banker Strain: Low Risk  (02/22/2024)   Received from [facility]   Overall Financial Resource Strain (CARDIA)    Difficulty of Paying Living Expenses: Not hard at all  Food Insecurity: No Food Insecurity (02/22/2024)   [facility]   Hunger Vital Sign    Within the past 12 months, you worried that your food would run out before you got the money to buy more.: Never true    Within the past 12 months, the food you bought just didn't last and you didn't have money to get more.: Never true  Transportation Needs: No Transportation Needs (02/22/2024)   Received from [facility] - Transportation    In the past 12 months, has lack of transportation kept you from medical appointments or from getting medications?: No    Lack of Transportation (Non-Medical): No  Physical Activity: Not on file  Stress: Not on file  Social Connections: Moderately Isolated (01/10/2024)   Social Connection and Isolation Panel    Frequency of Communication with Friends and Family: More than three times a week    Frequency of Social Gatherings with Friends and Family: Twice a week    Attends Religious Services: More than 4 times per year    Active Member of [employer] or Organizations: No    Attends Banker Meetings: Never    Marital Status: Divorced  Catering Manager Violence: Not At Risk (01/10/2024)   Humiliation, Afraid, Rape, and Kick questionnaire    Fear of Current or Ex-Partner: No    Emotionally Abused: No    Physically Abused: No    Sexually Abused: No    Past Surgical History:  Procedure Laterality Date   ABDOMINAL AORTOGRAM W/LOWER  EXTREMITY N/A 11/05/2023   Procedure: ABDOMINAL AORTOGRAM W/LOWER EXTREMITY;  Surgeon: Marea Selinda RAMAN, MD;  Location: [facility];  Service: Cardiovascular;  Laterality: N/A;   ACHILLES TENDON SURGERY Right 09/14/2023   Procedure: TENOTOMY, ACHILLES;  Surgeon: [name], DPM;  Location: [facility];  Service: Orthopedics/Podiatry;  Laterality: Right;   AMPUTATION Left 08/19/2022   Procedure: TRANSMETATARSAL AMPUTATION LEFT FOOT WITH IRRIGATION AND DEBRIDEMENT;  Surgeon: Lennie Barter, DPM;  Location: [facility];  Service: Podiatry;  Laterality: Left;   AMPUTATION Left 05/16/2023   Procedure: AMPUTATION BELOW KNEE;  Surgeon: Marea Selinda RAMAN, MD;  Location: [facility];  Service: General;  Laterality: Left;   AMPUTATION Right 09/14/2023   Procedure: AMPUTATION, FOOT, RAY;  Surgeon: [name], DPM;  Location: [facility];  Service: Orthopedics/Podiatry;  Laterality: Right;   AMPUTATION TOE Right 06/01/2015   Procedure: AMPUTATION TOE;  Surgeon: Donnice Cory, DPM;  Location: [facility];  Service: Podiatry;  Laterality: Right;   AMPUTATION TOE Left 08/11/2022   Procedure: AMPUTATION TOE 2, 3, 4;  Surgeon: [name], DPM;  Location: [facility];  Service: Podiatry;  Laterality: Left;   CATARACT EXTRACTION, BILATERAL     CIRCUMCISION N/A 06/12/2022   Procedure: CIRCUMCISION ADULT;  Surgeon: Penne Knee, MD;  Location: [facility];  Service: Urology;  Laterality: N/A;   COLONOSCOPY WITH PROPOFOL  N/A 02/14/2016   Procedure: COLONOSCOPY WITH PROPOFOL ;  Surgeon: Gladis RAYMOND Mariner, MD;  Location: [facility] ENDOSCOPY;  Service: Endoscopy;  Laterality: N/A;   COLONOSCOPY WITH PROPOFOL  N/A 01/07/2019   Procedure: COLONOSCOPY WITH PROPOFOL ;  Surgeon: Mariner Gladis RAYMOND, MD;  Location: [facility] ENDOSCOPY;  Service: Endoscopy;  Laterality: N/A;   CORONARY ARTERY BYPASS GRAFT N/A 08/2012   EXCISION PARTIAL PHALANX Right 06/01/2015   Procedure: EXCISION PARTIAL PHALANX /  BONE;  Surgeon: Donnice Cory, DPM;  Location:  [facility];  Service: Podiatry;  Laterality: Right;   FLEXIBLE SIGMOIDOSCOPY N/A 05/29/2016   Procedure: FLEXIBLE SIGMOIDOSCOPY;  Surgeon: Gladis RAYMOND Mariner, MD;  Location: [facility] ENDOSCOPY;  Service: Endoscopy;  Laterality: N/A;   INCISION AND DRAINAGE Left 08/23/2022   Procedure: INCISION AND DRAINAGE;  Surgeon: [name], DPM;  Location: [facility];  Service: Podiatry;  Laterality: Left;   INCISION AND DRAINAGE OF WOUND Left 09/15/2022   Procedure: 11044 - DEBRIDE BONE and EXCISION IF 1ST METATARSAL BONE WITH  DELAY PRIMARY CLOSURE;  Surgeon: [name], DPM;  Location: [facility];  Service: Podiatry;  Laterality: Left;   IRRIGATION AND DEBRIDEMENT FOOT Left 02/28/2023   Procedure: IRRIGATION AND DEBRIDEMENT FOOT;  Surgeon: [name], DPM;  Location: [facility];  Service: Orthopedics/Podiatry;  Laterality: Left;   IRRIGATION AND DEBRIDEMENT FOOT Right 01/11/2024   Procedure: IRRIGATION AND DEBRIDEMENT FOOT;  Surgeon: [name], DPM;  Location: [facility];  Service: Orthopedics/Podiatry;  Laterality: Right;   KNEE ARTHROSCOPY Left    LOWER EXTREMITY ANGIOGRAPHY Left 08/22/2022   Procedure: Lower Extremity Angiography;  Surgeon: Jama Cordella MATSU, MD;  Location: [facility];  Service: Cardiovascular;  Laterality: Left;   LOWER EXTREMITY ANGIOGRAPHY Left 05/11/2023   Procedure: Lower Extremity Angiography;  Surgeon: Marea Selinda RAMAN, MD;  Location: [facility];  Service: Cardiovascular;  Laterality: Left;   LOWER EXTREMITY ANGIOGRAPHY Right 09/11/2023   Procedure: Lower Extremity Angiography;  Surgeon: Jama Cordella MATSU, MD;  Location: [facility];  Service: Cardiovascular;  Laterality: Right;   LOWER EXTREMITY ANGIOGRAPHY Right 01/15/2024   Procedure: Lower Extremity Angiography;  Surgeon: Jama Cordella MATSU, MD;  Location: [facility];  Service: Cardiovascular;  Laterality: Right;   LOWER EXTREMITY INTERVENTION Right 09/11/2023   Procedure: LOWER EXTREMITY  INTERVENTION;  Surgeon: Jama Cordella MATSU, MD;  Location: [facility];  Service: Cardiovascular;  Laterality: Right;   TEE WITHOUT CARDIOVERSION N/A 03/01/2023   Procedure: TRANSESOPHAGEAL ECHOCARDIOGRAM;  Surgeon: Alluri, Keller BROCKS, MD;  Location: [facility];  Service: Cardiovascular;  Laterality: N/A;   TRANSMETATARSAL AMPUTATION Right 11/02/2023   Procedure: AMPUTATION, FOOT, TRANSMETATARSAL;  Surgeon: [name], DPM;  Location: [facility];  Service: Orthopedics/Podiatry;  Laterality: Right;   TRANSMETATARSAL AMPUTATION Right 12/21/2023   Procedure: REVISION, AMPUTATION, FOOT, TRANSMETATARSAL;  Surgeon: [name], DPM;  Location: [facility];  Service: Orthopedics/Podiatry;  Laterality: Right;   WOUND DEBRIDEMENT Left 09/15/2022   Procedure: 11043 - DEBRIDE SKIN. MUSCLE FASCIA;  Surgeon: [name], DPM;  Location: [facility];  Service: Podiatry;  Laterality: Left;    Family History  Problem Relation Age of Onset   Diabetes Mellitus II Mother    CAD Mother     No Known Allergies     Latest Ref Rng & Units 01/16/2024    3:11 AM 01/15/2024    4:54 AM 01/14/2024    3:45 AM  CBC  WBC 4.0 - 10.5 K/uL 3.7  4.8  7.4   Hemoglobin 13.0 - 17.0 g/dL 8.9  9.2  89.9   Hematocrit 39.0 - 52.0 % 27.9  28.8  31.3   Platelets 150 - 400 K/uL 121  131  145       CMP     Component Value Date/Time   NA 137 01/13/2024 0457   K 3.6 01/13/2024 0457   CL 108 01/13/2024 0457   CO2 22 01/13/2024 0457   GLUCOSE 115 (H) 01/13/2024 0457   GLUCOSE 402 (H) 09/17/2012 1417   BUN 14 01/13/2024 0457   CREATININE 1.26 (H) 01/13/2024 0457   CALCIUM 8.4 (L) 01/13/2024 0457   PROT 6.9 05/11/2023 0501   ALBUMIN 3.1 (L) 05/11/2023 0501   AST 26 05/11/2023 0501   ALT 19 05/11/2023 0501   ALKPHOS 52 05/11/2023 0501   BILITOT 0.8 05/11/2023 0501   EGFR 58.0 12/10/2023 1443   GFRNONAA 60 (L) 01/13/2024 0457     VAS US  ABI WITH/WO TBI Result Date: 02/21/2024  LOWER EXTREMITY DOPPLER STUDY  Patient Name:  Andrew Harding  Date of Exam:   02/21/2024 Medical Rec #: [id number]    Accession #:    [id number] Date of Birth: 06/10/1950     Patient Gender: M Patient Age:   32 years Exam Location:  [facility] Procedure:      VAS US  ABI WITH/WO TBI Referring Phys: GREGORY SCHNIER --------------------------------------------------------------------------------  Indications: Peripheral artery disease. High Risk Factors: Hypertension, hyperlipidemia, coronary artery disease.  Vascular Interventions: 01/15/2024 PTA and stent right pop and right anterior                         tibial arteries                          11/05/2023 Right ATA stent and PO/ Tib per PTA                          Left AT PTA 05/11/2023                         Left BKA 05/16/2023                          09/11/2023: PTA Rt Mid Popliteal Artery. PTA and Stent                         Rt ATA. PTA Rt Tibioperoneal trunk. Performing Technologist: Donnice Charnley RVT  Examination Guidelines: A complete evaluation includes  at minimum, Doppler waveform signals and systolic blood pressure reading at the level of bilateral brachial, anterior tibial, and posterior tibial arteries, when vessel segments are accessible. Bilateral testing is considered an integral part of a complete examination. Photoelectric Plethysmograph (PPG) waveforms and toe systolic pressure readings are included as required and additional duplex testing as needed. Limited examinations for reoccurring indications may be performed as noted.  ABI Findings: +---------+------------------+-----+----------+--------------------+ Right    Rt Pressure (mmHg)IndexWaveform  Comment              +---------+------------------+-----+----------+--------------------+ Brachial                                  PICC line            +---------+------------------+-----+----------+--------------------+ PTA      134               1.11 triphasic                       +---------+------------------+-----+----------+--------------------+ PERO     125               1.03 hyperemic                      +---------+------------------+-----+----------+--------------------+ DP       125               1.03 monophasic                     +---------+------------------+-----+----------+--------------------+ Great Toe                                 Trans met amputation +---------+------------------+-----+----------+--------------------+ +--------+------------------+-----+--------+-------+ Left    Lt Pressure (mmHg)IndexWaveformComment +--------+------------------+-----+--------+-------+ [id number]                                    +--------+------------------+-----+--------+-------+ +-------+-----------+-----------+----------------+------------+ ABI/TBIToday's ABIToday's TBIPrevious ABI    Previous TBI +-------+-----------+-----------+----------------+------------+ Right  1.03                  Non compressible             +-------+-----------+-----------+----------------+------------+ Left   BKA                                                +-------+-----------+-----------+----------------+------------+  Tibial arteries were non compressible at the ankle on the last exam (11/27/2023).  Summary: Right: Resting right ankle-brachial index is within normal range.  *See table(s) above for measurements and observations.  Electronically signed by Cordella Shawl MD on 02/21/2024 at 5:20:05 PM.    Final        Assessment & Plan:   1. Atherosclerosis of native arteries of the extremities with ulceration (HCC) (Primary)  Recommend:  The patient has evidence of severe atherosclerotic changes of both lower extremities associated with ulceration and tissue loss of the right foot.  This represents a limb threatening ischemia and places the patient at the risk for right limb loss.  Patient should undergo angiography of the right lower extremity  with the hope for intervention for limb salvage.  The risks and benefits as  well as the alternative therapies was discussed in detail with the patient.  All questions were answered.  Patient agrees to proceed with right angiography.  The patient will follow up with me in the office after the procedure.   2. Benign essential hypertension Continue antihypertensive medications as already ordered, these medications have been reviewed and there are no changes at this time.  3. Mixed hyperlipidemia Continue statin as ordered and reviewed, no changes at this time   Current Outpatient Medications on File Prior to Visit  Medication Sig Dispense Refill   Continuous Glucose Sensor (FREESTYLE LIBRE 2 SENSOR) MISC      cyanocobalamin  (VITAMIN B12) 1000 MCG tablet Take 1 tablet (1,000 mcg total) by mouth daily.     Dulaglutide  (TRULICITY ) 0.75 MG/0.5ML SOAJ Inject 0.75 mg into the skin once a week. Every Monday     ELIQUIS  5 MG TABS tablet TAKE 1 TABLET(5 MG) BY MOUTH TWICE DAILY 60 tablet 2   gabapentin  (NEURONTIN ) 300 MG capsule Take 1 capsule (300 mg total) by mouth 3 (three) times daily. 90 capsule 0   glucose blood (ONETOUCH ULTRA) test strip Use 3 (three) times daily     insulin  glargine (LANTUS ) 100 UNIT/ML Solostar Pen Inject 28 Units into the skin at bedtime. (Patient taking differently: Inject 28 Units into the skin daily.)     metFORMIN  (GLUCOPHAGE -XR) 500 MG 24 hr tablet Take 1,000 mg by mouth 2 (two) times daily with a meal.     metoprolol  tartrate (LOPRESSOR ) 25 MG tablet Take 0.5 tablets (12.5 mg total) by mouth 2 (two) times daily.     pravastatin  (PRAVACHOL ) 80 MG tablet Take 80 mg by mouth daily.     traZODone  (DESYREL ) 50 MG tablet Take 50 mg by mouth at bedtime.     acetaminophen  (TYLENOL ) 325 MG tablet Take 2 tablets (650 mg total) by mouth every 6 (six) hours as needed for mild pain (pain score 1-3) or fever (or Fever >/= 101). (Patient not taking: Reported on 03/07/2024) 30 tablet 0    aspirin  EC 81 MG tablet Take 1 tablet (81 mg total) by mouth daily. Swallow whole. (Patient not taking: Reported on 03/07/2024) 90 tablet 1   Insulin  Pen Needle (FIFTY50 PEN NEEDLES) 32G X 4 MM MISC USE 4 TIMES DAILY (Patient not taking: Reported on 03/07/2024)     No current facility-administered medications on file prior to visit.    There are no Patient Instructions on file for this visit. No follow-ups on file.   Severo Beber E Raeanne Deschler, NP

## 2024-03-10 ENCOUNTER — Telehealth (INDEPENDENT_AMBULATORY_CARE_PROVIDER_SITE_OTHER): Payer: Self-pay

## 2024-03-10 NOTE — Telephone Encounter (Signed)
 Spoke with the patient and he is scheduled with Dr. Jama on 03/11/24 with a 2:30 pm arrival time to the [facility] for a RLE angio. Pre-procedure instructions were discussed and will be sent to Mychart.

## 2024-03-11 ENCOUNTER — Other Ambulatory Visit: Payer: Self-pay

## 2024-03-11 ENCOUNTER — Encounter: Payer: Self-pay | Admitting: Vascular Surgery

## 2024-03-11 ENCOUNTER — Encounter
Admission: AD | Disposition: A | Discharge status: Skilled Nursing Facility | Payer: Self-pay | Source: Ambulatory Visit | Attending: Vascular Surgery

## 2024-03-11 ENCOUNTER — Inpatient Hospital Stay
Admission: AD | Admit: 2024-03-11 | Discharge: 2024-03-20 | DRG: 240 | Disposition: A | Discharge status: Skilled Nursing Facility | Source: Ambulatory Visit | Attending: Vascular Surgery | Admitting: Vascular Surgery

## 2024-03-11 DIAGNOSIS — L97919 Non-pressure chronic ulcer of unspecified part of right lower leg with unspecified severity: Secondary | ICD-10-CM | POA: Diagnosis not present

## 2024-03-11 DIAGNOSIS — T361X5A Adverse effect of cephalosporins and other beta-lactam antibiotics, initial encounter: Secondary | ICD-10-CM | POA: Diagnosis not present

## 2024-03-11 DIAGNOSIS — E782 Mixed hyperlipidemia: Secondary | ICD-10-CM | POA: Diagnosis present

## 2024-03-11 DIAGNOSIS — I70299 Other atherosclerosis of native arteries of extremities, unspecified extremity: Principal | ICD-10-CM | POA: Diagnosis present

## 2024-03-11 DIAGNOSIS — I70239 Atherosclerosis of native arteries of right leg with ulceration of unspecified site: Secondary | ICD-10-CM

## 2024-03-11 DIAGNOSIS — E1152 Type 2 diabetes mellitus with diabetic peripheral angiopathy with gangrene: Secondary | ICD-10-CM | POA: Diagnosis present

## 2024-03-11 DIAGNOSIS — E11621 Type 2 diabetes mellitus with foot ulcer: Secondary | ICD-10-CM | POA: Diagnosis present

## 2024-03-11 DIAGNOSIS — I129 Hypertensive chronic kidney disease with stage 1 through stage 4 chronic kidney disease, or unspecified chronic kidney disease: Secondary | ICD-10-CM | POA: Diagnosis present

## 2024-03-11 DIAGNOSIS — Z79899 Other long term (current) drug therapy: Secondary | ICD-10-CM

## 2024-03-11 DIAGNOSIS — I70261 Atherosclerosis of native arteries of extremities with gangrene, right leg: Principal | ICD-10-CM | POA: Diagnosis present

## 2024-03-11 DIAGNOSIS — Z7901 Long term (current) use of anticoagulants: Secondary | ICD-10-CM

## 2024-03-11 DIAGNOSIS — M86172 Other acute osteomyelitis, left ankle and foot: Principal | ICD-10-CM

## 2024-03-11 DIAGNOSIS — Z95828 Presence of other vascular implants and grafts: Secondary | ICD-10-CM

## 2024-03-11 DIAGNOSIS — Z89512 Acquired absence of left leg below knee: Secondary | ICD-10-CM

## 2024-03-11 DIAGNOSIS — Z7984 Long term (current) use of oral hypoglycemic drugs: Secondary | ICD-10-CM

## 2024-03-11 DIAGNOSIS — E1122 Type 2 diabetes mellitus with diabetic chronic kidney disease: Secondary | ICD-10-CM | POA: Diagnosis present

## 2024-03-11 DIAGNOSIS — M869 Osteomyelitis, unspecified: Secondary | ICD-10-CM | POA: Diagnosis present

## 2024-03-11 DIAGNOSIS — Z87891 Personal history of nicotine dependence: Secondary | ICD-10-CM

## 2024-03-11 DIAGNOSIS — Z794 Long term (current) use of insulin: Secondary | ICD-10-CM

## 2024-03-11 DIAGNOSIS — Z7985 Long-term (current) use of injectable non-insulin antidiabetic drugs: Secondary | ICD-10-CM

## 2024-03-11 DIAGNOSIS — L97514 Non-pressure chronic ulcer of other part of right foot with necrosis of bone: Secondary | ICD-10-CM | POA: Diagnosis present

## 2024-03-11 DIAGNOSIS — R000 Tachycardia, unspecified: Secondary | ICD-10-CM | POA: Diagnosis not present

## 2024-03-11 DIAGNOSIS — N1832 Chronic kidney disease, stage 3b: Secondary | ICD-10-CM | POA: Diagnosis present

## 2024-03-11 DIAGNOSIS — I2510 Atherosclerotic heart disease of native coronary artery without angina pectoris: Secondary | ICD-10-CM | POA: Diagnosis present

## 2024-03-11 DIAGNOSIS — T8789 Other complications of amputation stump: Secondary | ICD-10-CM | POA: Diagnosis not present

## 2024-03-11 DIAGNOSIS — L97909 Non-pressure chronic ulcer of unspecified part of unspecified lower leg with unspecified severity: Principal | ICD-10-CM | POA: Diagnosis present

## 2024-03-11 DIAGNOSIS — I10 Essential (primary) hypertension: Secondary | ICD-10-CM | POA: Diagnosis present

## 2024-03-11 DIAGNOSIS — I482 Chronic atrial fibrillation, unspecified: Secondary | ICD-10-CM | POA: Diagnosis present

## 2024-03-11 DIAGNOSIS — Z751 Person awaiting admission to adequate facility elsewhere: Secondary | ICD-10-CM

## 2024-03-11 DIAGNOSIS — Z7982 Long term (current) use of aspirin: Secondary | ICD-10-CM

## 2024-03-11 DIAGNOSIS — Z881 Allergy status to other antibiotic agents status: Secondary | ICD-10-CM

## 2024-03-11 DIAGNOSIS — Z833 Family history of diabetes mellitus: Secondary | ICD-10-CM

## 2024-03-11 DIAGNOSIS — E1142 Type 2 diabetes mellitus with diabetic polyneuropathy: Secondary | ICD-10-CM | POA: Diagnosis present

## 2024-03-11 DIAGNOSIS — Z951 Presence of aortocoronary bypass graft: Secondary | ICD-10-CM

## 2024-03-11 DIAGNOSIS — E785 Hyperlipidemia, unspecified: Secondary | ICD-10-CM | POA: Diagnosis present

## 2024-03-11 DIAGNOSIS — T7840XA Allergy, unspecified, initial encounter: Secondary | ICD-10-CM | POA: Diagnosis present

## 2024-03-11 DIAGNOSIS — L089 Local infection of the skin and subcutaneous tissue, unspecified: Secondary | ICD-10-CM | POA: Diagnosis present

## 2024-03-11 DIAGNOSIS — Z8249 Family history of ischemic heart disease and other diseases of the circulatory system: Secondary | ICD-10-CM

## 2024-03-11 HISTORY — PX: LOWER EXTREMITY ANGIOGRAPHY: CATH118251

## 2024-03-11 LAB — GLUCOSE, CAPILLARY
Glucose-Capillary: 101 mg/dL — ABNORMAL HIGH (ref 70–99)
Glucose-Capillary: 101 mg/dL — ABNORMAL HIGH (ref 70–99)
Glucose-Capillary: 163 mg/dL — ABNORMAL HIGH (ref 70–99)

## 2024-03-11 LAB — BUN: BUN: 17 mg/dL (ref 8–23)

## 2024-03-11 LAB — CREATININE, SERUM
Creatinine, Ser: 1.25 mg/dL — ABNORMAL HIGH (ref 0.61–1.24)
GFR, Estimated: >60 mL/min (ref >60)

## 2024-03-11 SURGERY — LOWER EXTREMITY ANGIOGRAPHY
Anesthesia: Moderate Sedation | Site: Leg Lower | Laterality: Left

## 2024-03-11 MED ORDER — INSULIN ASPART 100 UNIT/ML IJ SOLN
0.0000 [IU] | Freq: Three times a day (TID) | INTRAMUSCULAR | Status: DC
  Administered 2024-03-12: 3 [IU] via SUBCUTANEOUS
  Administered 2024-03-12: 2 [IU] via SUBCUTANEOUS
  Administered 2024-03-13: 3 [IU] via SUBCUTANEOUS
  Administered 2024-03-13: 8 [IU] via SUBCUTANEOUS
  Administered 2024-03-14 (×2): 3 [IU] via SUBCUTANEOUS
  Administered 2024-03-14: 5 [IU] via SUBCUTANEOUS
  Administered 2024-03-15: 2 [IU] via SUBCUTANEOUS
  Administered 2024-03-15 (×2): 3 [IU] via SUBCUTANEOUS
  Administered 2024-03-16: 2 [IU] via SUBCUTANEOUS
  Administered 2024-03-16: 5 [IU] via SUBCUTANEOUS
  Administered 2024-03-16: 2 [IU] via SUBCUTANEOUS
  Administered 2024-03-17: 5 [IU] via SUBCUTANEOUS
  Administered 2024-03-17: 3 [IU] via SUBCUTANEOUS
  Administered 2024-03-17 – 2024-03-18 (×2): 5 [IU] via SUBCUTANEOUS
  Administered 2024-03-18: 3 [IU] via SUBCUTANEOUS
  Administered 2024-03-18 – 2024-03-19 (×2): 2 [IU] via SUBCUTANEOUS
  Administered 2024-03-19: 3 [IU] via SUBCUTANEOUS
  Administered 2024-03-19: 5 [IU] via SUBCUTANEOUS
  Administered 2024-03-20 (×3): 3 [IU] via SUBCUTANEOUS
  Filled 2024-03-11: qty 5
  Filled 2024-03-11 (×2): qty 3
  Filled 2024-03-11 (×2): qty 5
  Filled 2024-03-11: qty 3
  Filled 2024-03-11: qty 5
  Filled 2024-03-11: qty 3
  Filled 2024-03-11: qty 2
  Filled 2024-03-11 (×3): qty 3
  Filled 2024-03-11: qty 2
  Filled 2024-03-11: qty 5
  Filled 2024-03-11: qty 2
  Filled 2024-03-11: qty 3
  Filled 2024-03-11: qty 5
  Filled 2024-03-11: qty 3
  Filled 2024-03-11: qty 2
  Filled 2024-03-11 (×2): qty 3
  Filled 2024-03-11: qty 2
  Filled 2024-03-11: qty 3
  Filled 2024-03-11: qty 2
  Filled 2024-03-11: qty 8

## 2024-03-11 MED ORDER — APIXABAN 5 MG PO TABS
5.0000 mg | ORAL_TABLET | Freq: Two times a day (BID) | ORAL | Status: DC
  Filled 2024-03-11: qty 1

## 2024-03-11 MED ORDER — FENTANYL CITRATE (PF) 50 MCG/ML IJ SOSY
PREFILLED_SYRINGE | INTRAMUSCULAR | Status: AC
  Filled 2024-03-11: qty 1

## 2024-03-11 MED ORDER — ACETAMINOPHEN 325 MG PO TABS
650.0000 mg | ORAL_TABLET | ORAL | Status: DC | PRN
Start: 2024-03-11 — End: 2024-03-21

## 2024-03-11 MED ORDER — HEPARIN SODIUM (PORCINE) 1000 UNIT/ML IJ SOLN
INTRAMUSCULAR | Status: AC
Start: 2024-03-11 — End: 2024-03-11
  Filled 2024-03-11: qty 10

## 2024-03-11 MED ORDER — MORPHINE SULFATE (PF) 2 MG/ML IV SOLN: 2.0000 mg | INTRAVENOUS | Status: DC | PRN

## 2024-03-11 MED ORDER — HEPARIN SODIUM (PORCINE) 1000 UNIT/ML IJ SOLN
INTRAMUSCULAR | Status: DC | PRN
  Administered 2024-03-11: 6000 [IU] via INTRAVENOUS

## 2024-03-11 MED ORDER — CEFAZOLIN SODIUM-DEXTROSE 2-4 GM/100ML-% IV SOLN
INTRAVENOUS | Status: AC
  Filled 2024-03-11: qty 100

## 2024-03-11 MED ORDER — METOPROLOL TARTRATE 25 MG PO TABS
12.5000 mg | ORAL_TABLET | Freq: Two times a day (BID) | ORAL | Status: DC
  Administered 2024-03-11 – 2024-03-20 (×18): 12.5 mg via ORAL
  Filled 2024-03-11 (×17): qty 1

## 2024-03-11 MED ORDER — IODIXANOL 320 MG/ML IV SOLN
INTRAVENOUS | Status: DC | PRN
  Administered 2024-03-11: 90 mL via INTRA_ARTERIAL

## 2024-03-11 MED ORDER — DIPHENHYDRAMINE HCL 50 MG/ML IJ SOLN: 50.0000 mg | Freq: Once | INTRAMUSCULAR | Status: DC | PRN

## 2024-03-11 MED ORDER — INSULIN ASPART 100 UNIT/ML IJ SOLN
0.0000 [IU] | Freq: Every day | INTRAMUSCULAR | Status: DC
  Administered 2024-03-13: 4 [IU] via SUBCUTANEOUS
  Administered 2024-03-15 – 2024-03-18 (×2): 2 [IU] via SUBCUTANEOUS
  Administered 2024-03-19: 3 [IU] via SUBCUTANEOUS
  Filled 2024-03-11: qty 2
  Filled 2024-03-11: qty 4
  Filled 2024-03-11: qty 2
  Filled 2024-03-11: qty 3

## 2024-03-11 MED ORDER — SODIUM CHLORIDE 0.9 % IV SOLN: INTRAVENOUS | Status: AC

## 2024-03-11 MED ORDER — MIDAZOLAM HCL 2 MG/2ML IJ SOLN
INTRAMUSCULAR | Status: AC
  Filled 2024-03-11: qty 2

## 2024-03-11 MED ORDER — SODIUM CHLORIDE 0.9% FLUSH
3.0000 mL | Freq: Two times a day (BID) | INTRAVENOUS | Status: DC
  Administered 2024-03-12 – 2024-03-20 (×17): 3 mL via INTRAVENOUS

## 2024-03-11 MED ORDER — TRAZODONE HCL 50 MG PO TABS
50.0000 mg | ORAL_TABLET | Freq: Every day | ORAL | Status: DC
  Administered 2024-03-11 – 2024-03-19 (×9): 50 mg via ORAL
  Filled 2024-03-11 (×9): qty 1

## 2024-03-11 MED ORDER — SODIUM CHLORIDE 0.9% FLUSH: 3.0000 mL | INTRAVENOUS | Status: DC | PRN

## 2024-03-11 MED ORDER — MIDAZOLAM HCL (PF) 2 MG/2ML IJ SOLN
INTRAMUSCULAR | Status: DC | PRN
  Administered 2024-03-11 (×3): .5 mg via INTRAVENOUS
  Administered 2024-03-11: 2 mg via INTRAVENOUS
  Administered 2024-03-11: .5 mg via INTRAVENOUS
  Administered 2024-03-11: 1 mg via INTRAVENOUS

## 2024-03-11 MED ORDER — PRAVASTATIN SODIUM 40 MG PO TABS
80.0000 mg | ORAL_TABLET | Freq: Every day | ORAL | Status: DC
  Administered 2024-03-11 – 2024-03-12 (×2): 80 mg via ORAL
  Filled 2024-03-11 (×2): qty 2

## 2024-03-11 MED ORDER — FENTANYL CITRATE (PF) 100 MCG/2ML IJ SOLN
INTRAMUSCULAR | Status: DC | PRN
  Administered 2024-03-11: 25 ug via INTRAVENOUS
  Administered 2024-03-11: 12.5 ug via INTRAVENOUS
  Administered 2024-03-11: 50 ug via INTRAVENOUS
  Administered 2024-03-11 (×2): 12.5 ug via INTRAVENOUS

## 2024-03-11 MED ORDER — METOPROLOL TARTRATE 25 MG PO TABS
12.5000 mg | ORAL_TABLET | Freq: Two times a day (BID) | ORAL | Status: DC
  Filled 2024-03-11 (×2): qty 1

## 2024-03-11 MED ORDER — OXYCODONE HCL 5 MG PO TABS
5.0000 mg | ORAL_TABLET | ORAL | Status: DC | PRN
  Administered 2024-03-15: 10 mg via ORAL
  Filled 2024-03-11: qty 2

## 2024-03-11 MED ORDER — HEPARIN (PORCINE) IN NACL 1000-0.9 UT/500ML-% IV SOLN
INTRAVENOUS | Status: DC | PRN
  Administered 2024-03-11: 1000 mL

## 2024-03-11 MED ORDER — SODIUM CHLORIDE FLUSH 0.9 % IV SOLN
INTRAVENOUS | Status: AC
  Filled 2024-03-11: qty 50

## 2024-03-11 MED ORDER — TIROFIBAN HCL IN NACL 5-0.9 MG/100ML-% IV SOLN
INTRAVENOUS | Status: AC
  Administered 2024-03-11: 2050 ug via INTRAVENOUS
  Filled 2024-03-11: qty 100

## 2024-03-11 MED ORDER — FAMOTIDINE 20 MG PO TABS: 40.0000 mg | ORAL_TABLET | Freq: Once | ORAL | Status: DC | PRN

## 2024-03-11 MED ORDER — LIDOCAINE HCL (PF) 1 % IJ SOLN
INTRAMUSCULAR | Status: DC | PRN
  Administered 2024-03-11: 10 mL via INTRADERMAL

## 2024-03-11 MED ORDER — ONDANSETRON HCL 4 MG/2ML IJ SOLN: 4.0000 mg | Freq: Four times a day (QID) | INTRAMUSCULAR | Status: DC | PRN

## 2024-03-11 MED ORDER — TIROFIBAN HCL IN NACL 5-0.9 MG/100ML-% IV SOLN
0.0750 ug/kg/min | INTRAVENOUS | Status: AC
  Administered 2024-03-12: 0.075 ug/kg/min via INTRAVENOUS
  Filled 2024-03-11: qty 100

## 2024-03-11 MED ORDER — HYDROMORPHONE HCL 1 MG/ML IJ SOLN: 1.0000 mg | Freq: Once | INTRAMUSCULAR | Status: DC | PRN

## 2024-03-11 MED ORDER — MIDAZOLAM HCL 2 MG/ML PO SYRP: 8.0000 mg | ORAL_SOLUTION | Freq: Once | ORAL | Status: DC | PRN

## 2024-03-11 MED ORDER — FENTANYL CITRATE (PF) 100 MCG/2ML IJ SOLN
INTRAMUSCULAR | Status: AC
  Filled 2024-03-11: qty 2

## 2024-03-11 MED ORDER — METHYLPREDNISOLONE SODIUM SUCC 125 MG IJ SOLR: 125.0000 mg | Freq: Once | INTRAMUSCULAR | Status: DC | PRN

## 2024-03-11 MED ORDER — SODIUM CHLORIDE 0.9 % IV SOLN
INTRAVENOUS | Status: DC
  Administered 2024-03-11: 250 mL via INTRAVENOUS

## 2024-03-11 MED ORDER — GABAPENTIN 300 MG PO CAPS
300.0000 mg | ORAL_CAPSULE | Freq: Three times a day (TID) | ORAL | Status: DC
  Administered 2024-03-11 – 2024-03-20 (×27): 300 mg via ORAL
  Filled 2024-03-11 (×27): qty 1

## 2024-03-11 MED ORDER — MIDAZOLAM HCL 5 MG/5ML IJ SOLN
INTRAMUSCULAR | Status: AC
  Filled 2024-03-11: qty 5

## 2024-03-11 MED ORDER — HEPARIN (PORCINE) 25000 UT/250ML-% IV SOLN
1300.0000 [IU]/h | INTRAVENOUS | Status: AC
  Administered 2024-03-12: 1000 [IU]/h via INTRAVENOUS
  Administered 2024-03-13: 1150 [IU]/h via INTRAVENOUS
  Filled 2024-03-11 (×2): qty 250

## 2024-03-11 MED ORDER — TIROFIBAN HCL IN NACL 5-0.9 MG/100ML-% IV SOLN
0.0750 ug/kg/min | INTRAVENOUS | Status: DC
Start: 2024-03-11 — End: 2024-03-11
  Administered 2024-03-11: 0.075 ug/kg/min via INTRAVENOUS
  Filled 2024-03-11: qty 100

## 2024-03-11 MED ORDER — CEFAZOLIN SODIUM-DEXTROSE 2-4 GM/100ML-% IV SOLN
2.0000 g | INTRAVENOUS | Status: AC
  Administered 2024-03-11: 2 g via INTRAVENOUS

## 2024-03-11 MED ORDER — SODIUM CHLORIDE 0.9 % IV SOLN: 250.0000 mL | INTRAVENOUS | Status: AC | PRN

## 2024-03-11 MED ORDER — TIROFIBAN (AGGRASTAT) BOLUS VIA INFUSION
25.0000 ug/kg | Freq: Once | INTRAVENOUS | Status: AC
Start: 2024-03-11 — End: 2024-03-11
  Filled 2024-03-11: qty 41

## 2024-03-11 SURGICAL SUPPLY — 22 items
BALLOON LUTONIX DCB 4X40X130 (BALLOONS) IMPLANT
BALLOON ULTRSCRE 014 3X200X150 (BALLOONS) IMPLANT
BALLOON ULTRVRSE 3X300X150 OTW (BALLOONS) IMPLANT
CATH ANGIO 5F PIGTAIL 65CM (CATHETERS) IMPLANT
CATH CXI SUPP 2.6F 150 ST (CATHETERS) IMPLANT
CATH VERT 5FR 125CM (CATHETERS) IMPLANT
COVER PROBE ULTRASOUND 5X96 (MISCELLANEOUS) IMPLANT
DEVICE PRESTO INFLATION (MISCELLANEOUS) IMPLANT
DEVICE STARCLOSE SE CLOSURE (Vascular Products) IMPLANT
GLIDEWIRE ADV .035X260CM (WIRE) IMPLANT
GOWN STRL REUS W/ TWL LRG LVL3 (GOWN DISPOSABLE) ×1 IMPLANT
GUIDEWIRE PFTE-COATED .018X300 (WIRE) IMPLANT
NDL ENTRY 21GA 7CM ECHOTIP (NEEDLE) IMPLANT
NEEDLE ENTRY 21GA 7CM ECHOTIP (NEEDLE) IMPLANT
PACK ANGIOGRAPHY (CUSTOM PROCEDURE TRAY) ×1 IMPLANT
SET INTRO CAPELLA COAXIAL (SET/KITS/TRAYS/PACK) IMPLANT
SHEATH BRITE TIP 5FRX11 (SHEATH) IMPLANT
SHEATH RAABE 6FRX70 (SHEATH) IMPLANT
SYR MEDRAD MARK 7 150ML (SYRINGE) IMPLANT
TUBING CONTRAST HIGH PRESS 72 (TUBING) IMPLANT
WIRE COMMAND ST STR 014 300 (WIRE) IMPLANT
WIRE J 3MM .035X145CM (WIRE) IMPLANT

## 2024-03-11 NOTE — Interval H&P Note (Signed)
 History and Physical Interval Note:  03/11/2024 2:39 PM  Andrew Harding  has presented today for surgery, with the diagnosis of RLE Angio   ASO w ulceration.  The various methods of treatment have been discussed with the patient and family. After consideration of risks, benefits and other options for treatment, the patient has consented to  Procedure(s): Lower Extremity Angiography (Left) as a surgical intervention.  The patient's history has been reviewed, patient examined, no change in status, stable for surgery.  I have reviewed the patient's chart and labs.  Questions were answered to the patient's satisfaction.     Cordella Shawl

## 2024-03-11 NOTE — Consult Note (Addendum)
 Pharmacy Consult Note - Anticoagulation  Pharmacy Consult for Heparin  Indication: ACS No Known Allergies  PATIENT MEASUREMENTS: Height: 6' 3 (190.5 cm) Weight: 82 kg (180 lb 12.4 oz) IBW/kg (Calculated) : 84.5 HEPARIN  DW (KG): 82  VITAL SIGNS: Temp: 97.6 F (36.4 C) (11/11 1847) Temp Source: Temporal (11/11 1730) BP: 111/74 (11/11 1847) Pulse Rate: 88 (11/11 1847)  Recent Labs    03/11/24 1505  CREATININE 1.25*    Estimated Creatinine Clearance: 60.1 mL/min (A) (by C-G formula based on SCr of 1.25 mg/dL (H)).  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) 09/03/2023   Atrial fibrillation with RVR (HCC) 08/19/2022   Bladder neck obstruction    Cellulitis 08/17/2022   Chronic kidney disease    Coronary artery disease    a.) s/p 4v CABG in 2014   Diabetes mellitus without complication (HCC)    Diabetic neuropathy (HCC)    Diabetic peripheral neuropathy (HCC)    Diabetic ulcer of toe of left foot associated with diabetes mellitus of other type, limited to breakdown of skin (HCC) 12/21/2017   Diverticulosis    Gout    Gram-negative bacteremia 05/11/2023   Heart murmur    History of osteomyelitis 05/31/2015   Hypercholesteremia    Hyperlipidemia    Hypertension    Hypotension due to hypovolemia 08/17/2022   Infection of left foot 08/19/2022   MSSA bacteremia 02/28/2023   Open wound of left foot with complication 05/10/2023   Osteomyelitis (HCC) 05/31/2015   Peripheral neuropathy    Postural dizziness with presyncope 02/26/2023   S/P BKA (below knee amputation) unilateral, 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   S/P transmetatarsal amputation of foot, left (HCC) 08/29/2022   Sepsis (HCC) 08/17/2022   Sepsis (HCC) 05/10/2023   Status post amputation of toe of  right foot 11/01/2015   Tubular adenoma    Vitamin D  deficiency     Medications:  Medications Prior to Admission  Medication Sig Dispense Refill Last Dose/Taking   ELIQUIS  5 MG TABS tablet TAKE 1 TABLET(5 MG) BY MOUTH TWICE DAILY 60 tablet 2 03/10/2024   gabapentin  (NEURONTIN ) 300 MG capsule Take 1 capsule (300 mg total) by mouth 3 (three) times daily. 90 capsule 0 03/10/2024   metFORMIN  (GLUCOPHAGE -XR) 500 MG 24 hr tablet Take 1,000 mg by mouth 2 (two) times daily with a meal.   03/10/2024   metoprolol  tartrate (LOPRESSOR ) 25 MG tablet Take 0.5 tablets (12.5 mg total) by mouth 2 (two) times daily.   03/10/2024   pravastatin  (PRAVACHOL ) 80 MG tablet Take 80 mg by mouth daily.   03/10/2024   traZODone  (DESYREL ) 50 MG tablet Take 50 mg by mouth at bedtime.   03/09/2024   acetaminophen  (TYLENOL ) 325 MG tablet Take 2 tablets (650 mg total) by mouth every 6 (six) hours as needed for mild pain (pain score 1-3) or fever (or Fever >/= 101). (Patient not taking: Reported on 03/07/2024) 30 tablet 0    aspirin  EC 81 MG tablet Take 1 tablet (81 mg total) by mouth daily. Swallow whole. (Patient not taking: Reported on 03/07/2024) 90 tablet 1    Continuous Glucose Sensor (FREESTYLE LIBRE 2 SENSOR) MISC       cyanocobalamin  (VITAMIN B12) 1000 MCG tablet Take 1 tablet (1,000 mcg total) by mouth daily.  03/09/2024   Dulaglutide  (TRULICITY ) 0.75 MG/0.5ML SOAJ Inject 0.75 mg into the skin once a week. Every Monday   03/09/2024   glucose blood (ONETOUCH ULTRA) test strip Use 3 (three) times daily      insulin  glargine (LANTUS ) 100 UNIT/ML Solostar Pen Inject 28 Units into the skin at bedtime. (Patient taking differently: Inject 28 Units into the skin daily.)   03/09/2024   Insulin  Pen Needle (FIFTY50 PEN NEEDLES) 32G X 4 MM MISC USE 4 TIMES DAILY (Patient not taking: Reported on 03/07/2024)      Scheduled:   gabapentin   300 mg Oral TID   [START ON 03/12/2024] insulin  aspart  0-15 Units Subcutaneous TID WC   insulin   aspart  0-5 Units Subcutaneous QHS   metoprolol  tartrate  12.5 mg Oral BID   pravastatin   80 mg Oral Daily   sodium chloride  flush  3 mL Intravenous Q12H   traZODone   50 mg Oral QHS   Infusions:   sodium chloride      sodium chloride      [START ON 03/12/2024] heparin      tirofiban      ASSESSMENT: 73 y.o. male with PMH PAD is presenting with non-healing wounds with ulceration on his right lower extremity and Atherosclerotic occlusive disease. Patient received a percutaneous transluminal angioplasty  on 11/11. Patient was previously on Eliquis  PTA per chart review. Patient has an upcoming procedure this week and will be transitioning to a heparin  gtt on 11/12 @ 7AM per MD Schnier. Pharmacy has been consulted to initiate and manage heparin  intravenous infusion.   Goal(s) of therapy: Heparin  level 0.3 - 0.7 units/mL aPTT 66 - 102 seconds Monitor platelets by anticoagulation protocol: Yes   PLAN:  Will omit bolus dose; then start heparin  infusion at 1000 units/hour on 11/12 @ 7AM.  Check aPTT in 8 hours after start of infusion.  Continue to titrate by aPTT until heparin  level and aPTT correlate then titrate by heparin  level alone.  Check heparin  level with next AM labs.  Continue to monitor CBC daily while on heparin  infusion.  Andrew Harding, PharmD Clinical Pharmacist 03/11/2024 7:49 PM

## 2024-03-11 NOTE — Op Note (Signed)
 [facility]  Percutaneous Study/Intervention Procedural Note   Date of Surgery: 03/11/2024  Surgeon:  Cordella Shawl  Pre-operative Diagnosis: Atherosclerotic occlusive disease bilateral lower extremities with ulceration and nonhealing wounds of the right lower extremity  Post-operative diagnosis:  Same  Procedure(s) Performed:             1.  Introduction catheter into right lower extremity 3rd order catheter placement with additional third order catheter placement             2.  Contrast injection right lower extremity for distal runoff with additional 3rd order             3.  Percutaneous transluminal angioplasty right anterior tibial artery                          4.  Star close closure left common femoral arteriotomy  Anesthesia: Conscious sedation was administered under my direct supervision by the interventional radiology RN. IV Versed  plus fentanyl  were utilized. Continuous ECG, pulse oximetry and blood pressure was monitored throughout the entire procedure.  Conscious sedation was for a total of 1 hour 17 minutes.  Sheath: 6 French Rabie left common femoral retrograde  Contrast: 90 cc  Fluoroscopy Time: 16.2 minutes  Indications:  Andrew Harding Feeling presents with increasing pain of the left lower extremity.  His amputation site has deteriorated and is clearly not healing.  Noninvasive studies have deteriorated as well.  This suggests the patient is having limb threatening ischemia. The risks and benefits are reviewed all questions answered patient agrees to proceed.  Procedure: Andrew Harding is a 72 y.o. y.o. male who was identified and appropriate procedural time out was performed.  The patient was then placed supine on the table and prepped and draped in the usual sterile fashion.    Ultrasound was placed in the sterile sleeve and the left groin was evaluated the left common femoral artery was echolucent and pulsatile indicating patency.  Image was  recorded for the permanent record and under real-time visualization a microneedle was inserted into the common femoral artery followed by the microwire and then the micro-sheath.  A J-wire was then advanced through the micro-sheath and a  5 French sheath was then inserted over a J-wire. J-wire was then advanced and a 5 French pigtail catheter was positioned at the level of T12.  AP projection of the aorta was then obtained. Pigtail catheter was repositioned to above the bifurcation and a LAO view of the pelvis was obtained.  Subsequently a pigtail catheter with the stiff angle Glidewire was used to cross the aortic bifurcation the catheter wire were advanced down into the right distal external iliac artery. Oblique view of the femoral bifurcation was then obtained and subsequently the wire was reintroduced and the pigtail catheter negotiated into the SFA representing third order catheter placement. Distal runoff was then performed.  6000 units of heparin  was then given and allowed to circulate and a 6 French Rabie sheath was advanced up and over the bifurcation and positioned in the mid superficial femoral artery  Straight catheter and stiff angle Glidewire were then negotiated down into the distal popliteal. Catheter was then advanced. Hand injection contrast demonstrated the tibial anatomy in detail.  Initially using the Kumpe catheter and the advantage wire I negotiated the catheter into the posterior tibial where magnified imaging was obtained.  Distally it was difficult to determine whether the plantar arteries were reconstituted  and I exchanged the 035 wire for a 0.018 advantage wire and using a CXI catheter negotiated the catheter down to the ankle.  Hand-injection of contrast at this level confirmed the plantar arteries were indeed occluded.  The catheter and wire were then pulled back into the popliteal and after obtaining several different images in different obliquities I was able to negotiate the  catheter and wire into the anterior tibial representing the second third order catheter placement.  I was then able to negotiate the wire and catheter down to the level of the ankle where hand-injection contrast did confirm patency of the distal anterior tibial and filling of the dorsalis pedis as well as the pedal arch.  I then advanced a 0.014 wire through the catheter.  A 3 mm x 200 mm ultra score balloon was used to angioplasty the anterior tibial artery beginning just proximal to the level of the malleolus I and extending all the way through the origin.  2 separate inflations were required.  The inflations were for 1 minute at 10 atm.  Follow-up imaging demonstrated the mid portion and more proximal anterior tibial artery appeared to be patent with less than 10% residual stenosis however the origin remained stenotic with greater than 50% stenosis.  I therefore advanced a 4 mm x 40 mm Lutonix drug-eluting balloon across the origin and inflated it to 10 atm for 1 minute.  Follow-up imaging demonstrated improvement at the origin but then just distal to this balloon placement there appeared to be greater than 40% residual stenosis and a second 4 mm x 40 mm Lutonix drug-eluting balloon was advanced across this area and inflated to 12 atm for 1 minute.  Follow-up imaging now demonstrated the proximal one third of the anterior tibial look quite nice and had less than 10% residual stenosis however the mid to distal portion now appeared to be narrowed.  I was not sure if this was spasm or perhaps thrombus or just residual stenosis.  I gave 300 mcg of intra-arterial nitroglycerin  and this did not change the situation at all.  I therefore advanced a 3 mm x 300 mm balloon across the anterior tibial beginning again just above the level of the malleolar eye and extending proximally.  Inflation was to 10 atm for 1 minute.  Follow-up imaging now demonstrated patency of the anterior tibial with less than 10% residual stenosis  throughout and filling of the dorsalis pedis.   After review of these images the sheath is pulled into the left external iliac oblique of the common femoral is obtained and a Star close device deployed. There no immediate Complications.  Findings:  The abdominal aorta is opacified with a bolus injection contrast. Renal arteries are single and widely patent. The aorta itself has diffuse disease but no hemodynamically significant lesions. The common and external iliac arteries are widely patent bilaterally.  The right common femoral is widely patent as is the profunda femoris.  The SFA and popliteal him and straight diffuse disease but there are no hemodynamically significant lesions.  Previously placed stent in the distal popliteal is widely patent.  The trifurcation is diseased with hemodynamically significant stenosis of the origin of the anterior tibial, peroneal and posterior tibial arteries.   The anterior tibial artery occludes shortly after its origin and then reconstitutes just proximal to the ankle and fills the DP and the pedal arch.  The posterior tibial does not reconstitute distally.  The peroneal does not have significant collaterals crossing the ankle distally.  Following angioplasty as described above the anterior tibial now is now widely patent and demonstrates in-line flow.  It is much improved and looks quite nice with less than 10% residual stenosis.   Summary: Successful recanalization right lower extremity for limb salvage                           Disposition: Patient was taken to the recovery room in stable condition having tolerated the procedure well.  Cordella Deddrick Saindon 03/11/2024,6:51 PM

## 2024-03-12 ENCOUNTER — Encounter: Payer: Self-pay | Admitting: Vascular Surgery

## 2024-03-12 DIAGNOSIS — E11628 Type 2 diabetes mellitus with other skin complications: Secondary | ICD-10-CM | POA: Diagnosis not present

## 2024-03-12 DIAGNOSIS — T148XXD Other injury of unspecified body region, subsequent encounter: Secondary | ICD-10-CM | POA: Diagnosis not present

## 2024-03-12 DIAGNOSIS — Z7985 Long-term (current) use of injectable non-insulin antidiabetic drugs: Secondary | ICD-10-CM | POA: Diagnosis not present

## 2024-03-12 DIAGNOSIS — I10 Essential (primary) hypertension: Secondary | ICD-10-CM | POA: Diagnosis not present

## 2024-03-12 DIAGNOSIS — I129 Hypertensive chronic kidney disease with stage 1 through stage 4 chronic kidney disease, or unspecified chronic kidney disease: Secondary | ICD-10-CM | POA: Diagnosis present

## 2024-03-12 DIAGNOSIS — E782 Mixed hyperlipidemia: Secondary | ICD-10-CM | POA: Diagnosis present

## 2024-03-12 DIAGNOSIS — Z8249 Family history of ischemic heart disease and other diseases of the circulatory system: Secondary | ICD-10-CM | POA: Diagnosis not present

## 2024-03-12 DIAGNOSIS — T7840XA Allergy, unspecified, initial encounter: Secondary | ICD-10-CM | POA: Diagnosis not present

## 2024-03-12 DIAGNOSIS — Z881 Allergy status to other antibiotic agents status: Secondary | ICD-10-CM | POA: Diagnosis not present

## 2024-03-12 DIAGNOSIS — L97909 Non-pressure chronic ulcer of unspecified part of unspecified lower leg with unspecified severity: Secondary | ICD-10-CM | POA: Diagnosis not present

## 2024-03-12 DIAGNOSIS — Z95828 Presence of other vascular implants and grafts: Secondary | ICD-10-CM | POA: Diagnosis not present

## 2024-03-12 DIAGNOSIS — T361X5A Adverse effect of cephalosporins and other beta-lactam antibiotics, initial encounter: Secondary | ICD-10-CM | POA: Diagnosis not present

## 2024-03-12 DIAGNOSIS — Z7982 Long term (current) use of aspirin: Secondary | ICD-10-CM | POA: Diagnosis not present

## 2024-03-12 DIAGNOSIS — I70261 Atherosclerosis of native arteries of extremities with gangrene, right leg: Secondary | ICD-10-CM | POA: Diagnosis present

## 2024-03-12 DIAGNOSIS — E11621 Type 2 diabetes mellitus with foot ulcer: Secondary | ICD-10-CM | POA: Diagnosis present

## 2024-03-12 DIAGNOSIS — Z87891 Personal history of nicotine dependence: Secondary | ICD-10-CM | POA: Diagnosis not present

## 2024-03-12 DIAGNOSIS — L97919 Non-pressure chronic ulcer of unspecified part of right lower leg with unspecified severity: Secondary | ICD-10-CM | POA: Diagnosis not present

## 2024-03-12 DIAGNOSIS — L97514 Non-pressure chronic ulcer of other part of right foot with necrosis of bone: Secondary | ICD-10-CM | POA: Diagnosis present

## 2024-03-12 DIAGNOSIS — Z9889 Other specified postprocedural states: Secondary | ICD-10-CM | POA: Diagnosis not present

## 2024-03-12 DIAGNOSIS — E1122 Type 2 diabetes mellitus with diabetic chronic kidney disease: Secondary | ICD-10-CM | POA: Diagnosis present

## 2024-03-12 DIAGNOSIS — I70239 Atherosclerosis of native arteries of right leg with ulceration of unspecified site: Secondary | ICD-10-CM | POA: Diagnosis not present

## 2024-03-12 DIAGNOSIS — R208 Other disturbances of skin sensation: Secondary | ICD-10-CM | POA: Diagnosis not present

## 2024-03-12 DIAGNOSIS — Z833 Family history of diabetes mellitus: Secondary | ICD-10-CM | POA: Diagnosis not present

## 2024-03-12 DIAGNOSIS — Z89512 Acquired absence of left leg below knee: Secondary | ICD-10-CM | POA: Diagnosis not present

## 2024-03-12 DIAGNOSIS — Z7901 Long term (current) use of anticoagulants: Secondary | ICD-10-CM | POA: Diagnosis not present

## 2024-03-12 DIAGNOSIS — I2510 Atherosclerotic heart disease of native coronary artery without angina pectoris: Secondary | ICD-10-CM | POA: Diagnosis present

## 2024-03-12 DIAGNOSIS — Z7984 Long term (current) use of oral hypoglycemic drugs: Secondary | ICD-10-CM | POA: Diagnosis not present

## 2024-03-12 DIAGNOSIS — N1832 Chronic kidney disease, stage 3b: Secondary | ICD-10-CM | POA: Diagnosis present

## 2024-03-12 DIAGNOSIS — Z751 Person awaiting admission to adequate facility elsewhere: Secondary | ICD-10-CM | POA: Diagnosis not present

## 2024-03-12 DIAGNOSIS — I70299 Other atherosclerosis of native arteries of extremities, unspecified extremity: Secondary | ICD-10-CM | POA: Diagnosis present

## 2024-03-12 DIAGNOSIS — I482 Chronic atrial fibrillation, unspecified: Secondary | ICD-10-CM | POA: Diagnosis present

## 2024-03-12 DIAGNOSIS — E1142 Type 2 diabetes mellitus with diabetic polyneuropathy: Secondary | ICD-10-CM | POA: Diagnosis present

## 2024-03-12 DIAGNOSIS — R000 Tachycardia, unspecified: Secondary | ICD-10-CM | POA: Diagnosis not present

## 2024-03-12 DIAGNOSIS — B9561 Methicillin susceptible Staphylococcus aureus infection as the cause of diseases classified elsewhere: Secondary | ICD-10-CM | POA: Diagnosis not present

## 2024-03-12 DIAGNOSIS — L089 Local infection of the skin and subcutaneous tissue, unspecified: Secondary | ICD-10-CM | POA: Diagnosis not present

## 2024-03-12 DIAGNOSIS — Z794 Long term (current) use of insulin: Secondary | ICD-10-CM | POA: Diagnosis not present

## 2024-03-12 DIAGNOSIS — E1152 Type 2 diabetes mellitus with diabetic peripheral angiopathy with gangrene: Secondary | ICD-10-CM | POA: Diagnosis present

## 2024-03-12 LAB — APTT
aPTT: 57 s — ABNORMAL HIGH (ref 24–36)
aPTT: 74 s — ABNORMAL HIGH (ref 24–36)

## 2024-03-12 LAB — CBC
HCT: 34.3 % — ABNORMAL LOW (ref 39.0–52.0)
Hemoglobin: 11.2 g/dL — ABNORMAL LOW (ref 13.0–17.0)
MCH: 27.3 pg (ref 26.0–34.0)
MCHC: 32.7 g/dL (ref 30.0–36.0)
MCV: 83.5 fL (ref 80.0–100.0)
Platelets: 271 K/uL (ref 150–400)
RBC: 4.11 MIL/uL — ABNORMAL LOW (ref 4.22–5.81)
RDW: 13.8 % (ref 11.5–15.5)
WBC: 8.7 K/uL (ref 4.0–10.5)
nRBC: 0 % (ref 0.0–0.2)

## 2024-03-12 LAB — VAS US ABI WITH/WO TBI: Right ABI: 1.47

## 2024-03-12 LAB — GLUCOSE, CAPILLARY
Glucose-Capillary: 148 mg/dL — ABNORMAL HIGH (ref 70–99)
Glucose-Capillary: 116 mg/dL — ABNORMAL HIGH (ref 70–99)
Glucose-Capillary: 176 mg/dL — ABNORMAL HIGH (ref 70–99)
Glucose-Capillary: 143 mg/dL — ABNORMAL HIGH (ref 70–99)

## 2024-03-12 MED ORDER — HEPARIN BOLUS VIA INFUSION
1250.0000 [IU] | Freq: Once | INTRAVENOUS | Status: AC
  Administered 2024-03-12: 1250 [IU] via INTRAVENOUS
  Filled 2024-03-12: qty 1250

## 2024-03-12 MED ORDER — ROSUVASTATIN CALCIUM 10 MG PO TABS
20.0000 mg | ORAL_TABLET | Freq: Every day | ORAL | Status: DC
  Administered 2024-03-13 – 2024-03-20 (×8): 20 mg via ORAL
  Filled 2024-03-12 (×8): qty 2

## 2024-03-12 MED ORDER — ATORVASTATIN CALCIUM 20 MG PO TABS: 40.0000 mg | ORAL_TABLET | Freq: Every day | ORAL | Status: DC

## 2024-03-12 NOTE — Consult Note (Signed)
 PODIATRIC SURGERY: CONSULT NOTE   Reason for Consult: right lower limb gangrene / ulceration   HPI: Andrew Harding is a 73 y.o. male that is well known to our outside clinic providers that presented for admission to the hospital for diagnosis of significant peripheral arterial disease and current wounds to the right lower extremity that has been slow to heal.  Patient recently underwent angioplasty to the right lower extremity with vascular surgery Dr. Jama 03/11/2024.  In an attempt for limb salvage there are plans to undergo wound debridement and potential VAC placement tomorrow with Dr. Ashley 03/13/2024.  Onset of wound: Gradual Course: Stagnant Aggravating factors: PAD, ambulation  Denies: F/C/N/V  SOB/CP/calf pain.  Last PO: Set for midnight tonight 03/12/2024   PMHx:  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) 09/03/2023   Atrial fibrillation with RVR (HCC) 08/19/2022   Bladder neck obstruction    Cellulitis 08/17/2022   Chronic kidney disease    Coronary artery disease    a.) s/p 4v CABG in 2014   Diabetes mellitus without complication (HCC)    Diabetic neuropathy (HCC)    Diabetic peripheral neuropathy (HCC)    Diabetic ulcer of toe of left foot associated with diabetes mellitus of other type, limited to breakdown of skin (HCC) 12/21/2017   Diverticulosis    Gout    Gram-negative bacteremia 05/11/2023   Heart murmur    History of osteomyelitis 05/31/2015   Hypercholesteremia    Hyperlipidemia    Hypertension    Hypotension due to hypovolemia 08/17/2022   Infection of left foot 08/19/2022   MSSA bacteremia 02/28/2023   Open wound of left foot with complication 05/10/2023   Osteomyelitis (HCC) 05/31/2015   Peripheral neuropathy    Postural dizziness with presyncope 02/26/2023   S/P BKA (below  knee amputation) unilateral, 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   S/P transmetatarsal amputation of foot, left (HCC) 08/29/2022   Sepsis (HCC) 08/17/2022   Sepsis (HCC) 05/10/2023   Status post amputation of toe of right foot 11/01/2015   Tubular adenoma    Vitamin D  deficiency     Surgical Hx:  Past Surgical History:  Procedure Laterality Date   ABDOMINAL AORTOGRAM W/LOWER EXTREMITY N/A 11/05/2023   Procedure: ABDOMINAL AORTOGRAM W/LOWER EXTREMITY;  Surgeon: Marea Selinda RAMAN, MD;  Location: [facility];  Service: Cardiovascular;  Laterality: N/A;   ACHILLES TENDON SURGERY Right 09/14/2023   Procedure: TENOTOMY, ACHILLES;  Surgeon: [name], DPM;  Location: [facility];  Service: Orthopedics/Podiatry;  Laterality: Right;   AMPUTATION Left 08/19/2022   Procedure: TRANSMETATARSAL AMPUTATION LEFT FOOT WITH IRRIGATION AND DEBRIDEMENT;  Surgeon: Lennie Barter, DPM;  Location: [facility];  Service: Podiatry;  Laterality: Left;   AMPUTATION Left 05/16/2023   Procedure: AMPUTATION BELOW KNEE;  Surgeon: Marea Selinda RAMAN, MD;  Location: [facility];  Service: General;  Laterality: Left;   AMPUTATION Right 09/14/2023   Procedure: AMPUTATION, FOOT, RAY;  Surgeon: [name], DPM;  Location: [facility];  Service: Orthopedics/Podiatry;  Laterality: Right;   AMPUTATION TOE Right 06/01/2015   Procedure: AMPUTATION TOE;  Surgeon: Donnice Cory, DPM;  Location: [facility];  Service: Podiatry;  Laterality: Right;   AMPUTATION TOE Left 08/11/2022   Procedure: AMPUTATION TOE 2, 3, 4;  Surgeon: [name], DPM;  Location: [facility];  Service: Podiatry;  Laterality: Left;   CATARACT EXTRACTION, BILATERAL     CIRCUMCISION N/A 06/12/2022   Procedure: CIRCUMCISION ADULT;  Surgeon: Penne Knee, MD;  Location: [facility];  Service: Urology;  Laterality: N/A;   COLONOSCOPY WITH PROPOFOL  N/A 02/14/2016   Procedure: COLONOSCOPY WITH PROPOFOL ;  Surgeon: Gladis RAYMOND Mariner, MD;  Location: [facility]OS[facility];   Service: Endoscopy;  Laterality: N/A;   COLONOSCOPY WITH PROPOFOL  N/A 01/07/2019   Procedure: COLONOSCOPY WITH PROPOFOL ;  Surgeon: Mariner Gladis RAYMOND, MD;  Location: [facility] ENDOS[facility];  Service: Endoscopy;  Laterality: N/A;   CORONARY ARTERY BYPASS GRAFT N/A 08/2012   EXCISION PARTIAL PHALANX Right 06/01/2015   Procedure: EXCISION PARTIAL PHALANX /  BONE;  Surgeon: Donnice Cory, DPM;  Location: [facility];  Service: Podiatry;  Laterality: Right;   FLEXIBLE SIGMOIDOSCOPY N/A 05/29/2016   Procedure: FLEXIBLE SIGMOIDOSCOPY;  Surgeon: Gladis RAYMOND Mariner, MD;  Location: [facility] ENDOSCOPY;  Service: Endoscopy;  Laterality: N/A;   INCISION AND DRAINAGE Left 08/23/2022   Procedure: INCISION AND DRAINAGE;  Surgeon: [name], DPM;  Location: [facility];  Service: Podiatry;  Laterality: Left;   INCISION AND DRAINAGE OF WOUND Left 09/15/2022   Procedure: 11044 - DEBRIDE BONE and EXCISION IF 1ST METATARSAL BONE WITH  DELAY PRIMARY CLOSURE;  Surgeon: [name], DPM;  Location: [facility];  Service: Podiatry;  Laterality: Left;   IRRIGATION AND DEBRIDEMENT FOOT Left 02/28/2023   Procedure: IRRIGATION AND DEBRIDEMENT FOOT;  Surgeon: [name], DPM;  Location: [facility];  Service: Orthopedics/Podiatry;  Laterality: Left;   IRRIGATION AND DEBRIDEMENT FOOT Right 01/11/2024   Procedure: IRRIGATION AND DEBRIDEMENT FOOT;  Surgeon: [name], DPM;  Location: [facility];  Service: Orthopedics/Podiatry;  Laterality: Right;   KNEE ARTHROSCOPY Left    LOWER EXTREMITY ANGIOGRAPHY Left 08/22/2022   Procedure: Lower Extremity Angiography;  Surgeon: Jama Cordella MATSU, MD;  Location: [facility];  Service: Cardiovascular;  Laterality: Left;   LOWER EXTREMITY ANGIOGRAPHY Left 05/11/2023   Procedure: Lower Extremity Angiography;  Surgeon: Marea Selinda RAMAN, MD;  Location: [facility];  Service: Cardiovascular;  Laterality: Left;   LOWER EXTREMITY ANGIOGRAPHY Right 09/11/2023   Procedure: Lower Extremity  Angiography;  Surgeon: Jama Cordella MATSU, MD;  Location: [facility];  Service: Cardiovascular;  Laterality: Right;   LOWER EXTREMITY ANGIOGRAPHY Right 01/15/2024   Procedure: Lower Extremity Angiography;  Surgeon: Jama Cordella MATSU, MD;  Location: [facility];  Service: Cardiovascular;  Laterality: Right;   LOWER EXTREMITY ANGIOGRAPHY Left 03/11/2024   Procedure: Lower Extremity Angiography;  Surgeon: Jama Cordella MATSU, MD;  Location: [facility];  Service: Cardiovascular;  Laterality: Left;   LOWER EXTREMITY INTERVENTION Right 09/11/2023   Procedure: LOWER EXTREMITY INTERVENTION;  Surgeon: Jama Cordella MATSU, MD;  Location: [facility];  Service: Cardiovascular;  Laterality: Right;   TEE WITHOUT CARDIOVERSION N/A 03/01/2023   Procedure: TRANSESOPHAGEAL ECHOCARDIOGRAM;  Surgeon: Alluri, Keller BROCKS, MD;  Location: [facility];  Service: Cardiovascular;  Laterality: N/A;   TRANSMETATARSAL AMPUTATION Right 11/02/2023   Procedure: AMPUTATION, FOOT, TRANSMETATARSAL;  Surgeon: [name], DPM;  Location: [facility];  Service: Orthopedics/Podiatry;  Laterality: Right;   TRANSMETATARSAL AMPUTATION Right 12/21/2023   Procedure: REVISION, AMPUTATION, FOOT, TRANSMETATARSAL;  Surgeon: [name], DPM;  Location: [facility];  Service: Orthopedics/Podiatry;  Laterality: Right;   WOUND DEBRIDEMENT Left 09/15/2022   Procedure: 11043 - DEBRIDE SKIN. MUSCLE FASCIA;  Surgeon: [name], DPM;  Location: [facility];  Service: Podiatry;  Laterality: Left;  FHx:  Family History  Problem Relation Age of Onset   Diabetes Mellitus II Mother    CAD Mother     Social History:  reports that he has quit smoking. His smoking use included cigars. He has never used smokeless tobacco. He reports that he does not drink alcohol and does not use drugs.  Allergies: No Known Allergies  Medications Prior to Admission  Medication Sig Dispense Refill   ELIQUIS  5 MG TABS tablet TAKE 1  TABLET(5 MG) BY MOUTH TWICE DAILY 60 tablet 2   gabapentin  (NEURONTIN ) 300 MG capsule Take 1 capsule (300 mg total) by mouth 3 (three) times daily. 90 capsule 0   metFORMIN  (GLUCOPHAGE -XR) 500 MG 24 hr tablet Take 1,000 mg by mouth 2 (two) times daily with a meal.     metoprolol  tartrate (LOPRESSOR ) 25 MG tablet Take 0.5 tablets (12.5 mg total) by mouth 2 (two) times daily.     pravastatin  (PRAVACHOL ) 80 MG tablet Take 80 mg by mouth daily.     traZODone  (DESYREL ) 50 MG tablet Take 50 mg by mouth at bedtime.     acetaminophen  (TYLENOL ) 325 MG tablet Take 2 tablets (650 mg total) by mouth every 6 (six) hours as needed for mild pain (pain score 1-3) or fever (or Fever >/= 101). (Patient not taking: Reported on 03/07/2024) 30 tablet 0   aspirin  EC 81 MG tablet Take 1 tablet (81 mg total) by mouth daily. Swallow whole. (Patient not taking: Reported on 03/07/2024) 90 tablet 1   Continuous Glucose Sensor (FREESTYLE LIBRE 2 SENSOR) MISC      cyanocobalamin  (VITAMIN B12) 1000 MCG tablet Take 1 tablet (1,000 mcg total) by mouth daily.     Dulaglutide  (TRULICITY ) 0.75 MG/0.5ML SOAJ Inject 0.75 mg into the skin once a week. Every Monday     glucose blood (ONETOUCH ULTRA) test strip Use 3 (three) times daily     insulin  glargine (LANTUS ) 100 UNIT/ML Solostar Pen Inject 28 Units into the skin at bedtime. (Patient taking differently: Inject 28 Units into the skin daily.)     Insulin  Pen Needle (FIFTY50 PEN NEEDLES) 32G X 4 MM MISC USE 4 TIMES DAILY (Patient not taking: Reported on 03/07/2024)      Physical Exam: Blood pressure 117/64, pulse 82, temperature 98.7 F (37.1 C), resp. rate 20, height 6' 3 (1.905 m), weight 82 kg, SpO2 96%.  GEN: NAD. AOX3. ? RESP: Non-labored breathing on RA.? ABD: NT/ND of all four quadrants.? NEURO: Moving all four extremities spontaneously. ?  ? FOCUSED LOWER EXTREMITY EXAMINATION:? NEURO: ? - LOPS - diminished gross touch  - No paresthesias elicited on examination. ??    VASCULAR: ? - DP/PT - diminished  - Focal edema noted adjacent to surgical site ; as expected for post-operative state. ??   MSK: ? - s/p L BKA  - S/p R TMA  - TTP none? - No calf tenderness RLE ?   DERM: ? SKIN: Outer skin necrotic with prolonged healing expected. Wound not deep but concerning. Wound appears deeper than expected. Deeper tissue appears viable.   The lateral distal fifth metatarsal wound looks to be a little bit deeper today.  Some fibrotic tissue noted.  No purulent drainage.  It definitely probes deeper than previous.  The distal medial ulcerative site looks to be healing in nicely.   The medial lateral ankle decubitus area as well as posterior heel area are stable.   There is definite worsening of necrotic tissue to the lateral aspect  of the fifth metatarsal at this time.  Much larger than previous.  No signs of active infection. - No proximal streaking. No malodor. ? - No clinical signs of infection noted. ??         Results for orders placed or performed during the hospital encounter of 03/11/24 (from the past 48 hours)  BUN     Status: None   Collection Time: 03/11/24  3:05 PM  Result Value Ref Range   BUN 17 8 - 23 mg/dL    Comment: Performed at [facility], [address] [place], [place] [address]  Creatinine, serum     Status: Abnormal   Collection Time: 03/11/24  3:05 PM  Result Value Ref Range   Creatinine, Ser 1.25 (H) 0.61 - 1.24 mg/dL   GFR, Estimated >39 >39 mL/min    Comment: (NOTE) Calculated using the CKD-EPI Creatinine Equation (2021) Performed at [facility], [address] [place], [place] [address]   Glucose, capillary     Status: Abnormal   Collection Time: 03/11/24  3:13 PM  Result Value Ref Range   Glucose-Capillary 101 (H) 70 - 99 mg/dL    Comment: Glucose reference range applies only to samples taken after fasting for at least 8 hours.  Glucose, capillary     Status: Abnormal   Collection Time:  03/11/24  5:51 PM  Result Value Ref Range   Glucose-Capillary 101 (H) 70 - 99 mg/dL    Comment: Glucose reference range applies only to samples taken after fasting for at least 8 hours.  Glucose, capillary     Status: Abnormal   Collection Time: 03/11/24  9:39 PM  Result Value Ref Range   Glucose-Capillary 163 (H) 70 - 99 mg/dL    Comment: Glucose reference range applies only to samples taken after fasting for at least 8 hours.  CBC     Status: Abnormal   Collection Time: 03/12/24  5:53 AM  Result Value Ref Range   WBC 8.7 4.0 - 10.5 K/uL   RBC 4.11 (L) 4.22 - 5.81 MIL/uL   Hemoglobin 11.2 (L) 13.0 - 17.0 g/dL   HCT 65.6 (L) 60.9 - 47.9 %   MCV 83.5 80.0 - 100.0 fL   MCH 27.3 26.0 - 34.0 pg   MCHC 32.7 30.0 - 36.0 g/dL   RDW 86.1 88.4 - 84.4 %   Platelets 271 150 - 400 K/uL   nRBC 0.0 0.0 - 0.2 %    Comment: Performed at [facility], [address] [place], [place] [address]   PERIPHERAL VASCULAR CATHETERIZATION Result Date: 03/11/2024 See surgical note for result.   Assessment Significant peripheral arterial disease bilateral lower extremity, status post L BKA & recent 03/11/2024 angioplasty to the R LE with active gangrene to the right foot. 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 Ashley and patient regarding the possibilities of limb salvage and further attempts from podiatry standpoint.  The plan will be to undergo fifth metatarsal resection of the right foot tomorrow 03/13/2024 with debridement of all necrotic tissue, and possible wound VAC placement.  Strong possibility of below-knee amputation is present if this debridement and limb salvage effort fails.  Patient is aware.   Plan  - ABX: Appreciate Medicine / ID / Pharmacy assist with antibiotic stewardship and appropriate coverage.  - Diet: N.p.o. at midnight 03/12/2024 - Activity:   NWB RIGHT  lower extremity.   - Wound Care: pending surgical intervention    Glover Capano KANDICE Blush 03/12/2024, 7:36 AM

## 2024-03-12 NOTE — Consult Note (Signed)
 Pharmacy Consult Note - Anticoagulation  Pharmacy Consult for Heparin  Indication: ACS No Known Allergies  PATIENT MEASUREMENTS: Height: 6' 3 (190.5 cm) Weight: 82 kg (180 lb 12.4 oz) IBW/kg (Calculated) : 84.5 HEPARIN  DW (KG): 82  VITAL SIGNS: Temp: 97.6 F (36.4 C) (11/12 1945) BP: 115/78 (11/12 1945) Pulse Rate: 81 (11/12 1945)  Recent Labs    03/11/24 1505 03/12/24 0553 03/12/24 1427 03/12/24 2243  HGB  --  11.2*  --   --   HCT  --  34.3*  --   --   PLT  --  271  --   --   APTT  --   --    < > 74*  CREATININE 1.25*  --   --   --    < > = values in this interval not displayed.    Estimated Creatinine Clearance: 60.1 mL/min (A) (by C-G formula based on SCr of 1.25 mg/dL (H)).  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) 09/03/2023   Atrial fibrillation with RVR (HCC) 08/19/2022   Bladder neck obstruction    Cellulitis 08/17/2022   Chronic kidney disease    Coronary artery disease    a.) s/p 4v CABG in 2014   Diabetes mellitus without complication (HCC)    Diabetic neuropathy (HCC)    Diabetic peripheral neuropathy (HCC)    Diabetic ulcer of toe of left foot associated with diabetes mellitus of other type, limited to breakdown of skin (HCC) 12/21/2017   Diverticulosis    Gout    Gram-negative bacteremia 05/11/2023   Heart murmur    History of osteomyelitis 05/31/2015   Hypercholesteremia    Hyperlipidemia    Hypertension    Hypotension due to hypovolemia 08/17/2022   Infection of left foot 08/19/2022   MSSA bacteremia 02/28/2023   Open wound of left foot with complication 05/10/2023   Osteomyelitis (HCC) 05/31/2015   Peripheral neuropathy    Postural dizziness with presyncope 02/26/2023   S/P BKA (below knee amputation) unilateral, 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  Take 1 tablet (875 mg total) by mouth every 12 (twelve) hours for 7 days   03/09/2024   cyanocobalamin  (VITAMIN B12) 1000 MCG tablet Take 1 tablet (1,000 mcg total) by mouth daily.   03/09/2024   ELIQUIS  5 MG TABS tablet TAKE 1 TABLET(5 MG) BY MOUTH TWICE DAILY 60 tablet 2 03/10/2024   gabapentin  (NEURONTIN ) 300 MG capsule Take 1 capsule (300 mg total) by mouth 3 (three) times daily. 90 capsule 0 03/10/2024   insulin  glargine (LANTUS ) 100 UNIT/ML Solostar Pen Inject 28 Units into the skin at bedtime. (Patient taking differently: Inject 28 Units into the skin daily.)   03/09/2024   metFORMIN  (GLUCOPHAGE -XR) 500 MG 24 hr tablet Take 1,000 mg by mouth 2 (two) times daily with a meal.   03/10/2024   metoprolol  tartrate (LOPRESSOR )  25 MG tablet Take 0.5 tablets (12.5 mg total) by mouth 2 (two) times daily.   03/10/2024   OZEMPIC, 1 MG/DOSE, 4 MG/3ML SOPN Inject 1 mg into the skin once a week.   Past Week   pravastatin  (PRAVACHOL ) 80 MG tablet Take 80 mg by mouth daily.   03/10/2024   traZODone  (DESYREL ) 50 MG tablet Take 50 mg by mouth at bedtime.   03/09/2024   acetaminophen  (TYLENOL ) 325 MG tablet Take 2 tablets (650 mg total) by mouth every 6 (six) hours as needed for mild pain (pain score 1-3) or fever (or Fever >/= 101). (Patient not taking: Reported on 03/07/2024) 30 tablet 0    aspirin  EC 81 MG tablet Take 1 tablet (81 mg total) by mouth daily. Swallow whole. (Patient not taking: Reported on 03/07/2024) 90 tablet 1    Continuous Glucose Sensor (FREESTYLE LIBRE 2 SENSOR)  MISC       Dulaglutide  (TRULICITY ) 0.75 MG/0.5ML SOAJ Inject 0.75 mg into the skin once a week. Every Monday (Patient not taking: Reported on 03/12/2024)   Not Taking   glucose blood (ONETOUCH ULTRA) test strip Use 3 (three) times daily      Insulin  Pen Needle (FIFTY50 PEN NEEDLES) 32G X 4 MM MISC USE 4 TIMES DAILY (Patient not taking: Reported on 03/07/2024)      Scheduled:   gabapentin   300 mg Oral TID   insulin  aspart  0-15 Units Subcutaneous TID WC   insulin  aspart  0-5 Units Subcutaneous QHS   metoprolol  tartrate  12.5 mg Oral BID   [START ON 03/13/2024] rosuvastatin  20 mg Oral Daily   sodium chloride  flush  3 mL Intravenous Q12H   traZODone   50 mg Oral QHS   Infusions:   heparin  1,150 Units/hr (03/12/24 1707)    ASSESSMENT: 73 y.o. male with PMH PAD is presenting with non-healing wounds with ulceration on his right lower extremity and Atherosclerotic occlusive disease. Patient received a percutaneous transluminal angioplasty  on 11/11. Patient was previously on Eliquis  PTA per chart review. Patient has an upcoming procedure this week and will be transitioning to a heparin  gtt on 11/12 @ 7AM per MD Schnier. Pharmacy has been consulted to initiate and manage heparin  intravenous infusion.   Goal(s) of therapy: Heparin  level 0.3 - 0.7 units/mL aPTT 66 - 102 seconds Monitor platelets by anticoagulation protocol: Yes  Date Time aPTT Rate/Comment  11/12  1427 57 Subtherapeutic @ 1000 units/hr 11/12 2243 74 Therapeutic x 1    PLAN: Continue heparin  infusion at 1150 units/hour Recheck aPTT w/ AM labs to confirm Continue to titrate by aPTT until heparin  level and aPTT correlate then titrate by heparin  level alone. Check heparin  level with next AM labs. Continue to monitor CBC daily while on heparin  infusion.  Rankin CANDIE Dills, PharmD, [facility] 03/12/2024 11:35 PM

## 2024-03-12 NOTE — Progress Notes (Signed)
 Progress Note    03/12/2024 10:00 AM 1 Day Post-Op  Subjective:  Andrew Harding is a 73 yo male now POD #1 from:  Date of Surgery: 03/11/2024   Surgeon:  Andrew Harding   Pre-operative Diagnosis: Atherosclerotic occlusive disease bilateral lower extremities with ulceration and nonhealing wounds of the right lower extremity   Post-operative diagnosis:  Same   Procedure(s) Performed:             1.  Introduction catheter into right lower extremity 3rd order catheter placement with additional third order catheter placement             2.  Contrast injection right lower extremity for distal runoff with additional 3rd order             3.  Percutaneous transluminal angioplasty right anterior tibial artery                          4.  Star close closure left common femoral arteriotomy  Patient is resting comfortably in bed this morning.  No complaints overnight.  Patient endorses right lower extremity feels better today.  Vitals are remained stable.   Vitals:   03/12/24 0426 03/12/24 0803  BP: 117/64 104/67  Pulse: 82 84  Resp: 20 15  Temp: 98.7 F (37.1 C) 98.4 F (36.9 C)  SpO2: 96% 96%   Physical Exam: Cardiac:  Irregular Hear Rate Hx of Atrial Fibrillation  Lungs: Nonlabored breathing, clear on auscultation throughout, no rales rhonchi or wheezing. Incisions: Left groin puncture site with dressing clean dry and intact.  No hematoma seroma noted. Extremities: Right lower extremity with Doppler pulses only.  Extremities warm to touch. Abdomen: Positive bowel sounds throughout, soft, nontender nondistended. Neurologic: Alert and oriented x 3, answers all questions follows commands appropriately.  CBC    Component Value Date/Time   WBC 8.7 03/12/2024 0553   RBC 4.11 (L) 03/12/2024 0553   HGB 11.2 (L) 03/12/2024 0553   HCT 34.3 (L) 03/12/2024 0553   PLT 271 03/12/2024 0553   MCV 83.5 03/12/2024 0553   MCH 27.3 03/12/2024 0553   MCHC 32.7 03/12/2024 0553   RDW 13.8  03/12/2024 0553   LYMPHSABS 1.2 01/10/2024 1218   MONOABS 0.4 01/10/2024 1218   EOSABS 0.3 01/10/2024 1218   BASOSABS 0.1 01/10/2024 1218    BMET    Component Value Date/Time   NA 137 01/13/2024 0457   K 3.6 01/13/2024 0457   CL 108 01/13/2024 0457   CO2 22 01/13/2024 0457   GLUCOSE 115 (H) 01/13/2024 0457   GLUCOSE 402 (H) 09/17/2012 1417   BUN 17 03/11/2024 1505   CREATININE 1.25 (H) 03/11/2024 1505   CALCIUM 8.4 (L) 01/13/2024 0457   GFRNONAA >60 03/11/2024 1505   GFRAA 58 (L) 06/02/2015 0656    INR    Component Value Date/Time   INR 1.2 01/12/2024 1433     Intake/Output Summary (Last 24 hours) at 03/12/2024 1000 Last data filed at 03/12/2024 0200 Gross per 24 hour  Intake 280.42 ml  Output 400 ml  Net -119.58 ml     Assessment/Plan:  73 y.o. male is s/p SEE ABOVE 1 Day Post-Op   PLAN Pain medication as needed. OOB to chair today and ambulate in halls if able with assistance. Physical therapy consult Podiatry consult to evaluate foot for further surgical intervention.  DVT prophylaxis: Aggrastat infusion converted to heparin  infusion today.   Andrew Harding [facility] 03/12/2024 10:00 AM

## 2024-03-12 NOTE — Consult Note (Signed)
 Pharmacy Consult Note - Anticoagulation  Pharmacy Consult for Heparin  Indication: ACS No Known Allergies  PATIENT MEASUREMENTS: Height: 6' 3 (190.5 cm) Weight: 82 kg (180 lb 12.4 oz) IBW/kg (Calculated) : 84.5 HEPARIN  DW (KG): 82  VITAL SIGNS: Temp: 97.7 F (36.5 C) (11/12 1521) BP: 104/59 (11/12 1521) Pulse Rate: 79 (11/12 1521)  Recent Labs    03/11/24 1505 03/12/24 0553 03/12/24 1427  HGB  --  11.2*  --   HCT  --  34.3*  --   PLT  --  271  --   APTT  --   --  57*  CREATININE 1.25*  --   --     Estimated Creatinine Clearance: 60.1 mL/min (A) (by C-G formula based on SCr of 1.25 mg/dL (H)).  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) 09/03/2023   Atrial fibrillation with RVR (HCC) 08/19/2022   Bladder neck obstruction    Cellulitis 08/17/2022   Chronic kidney disease    Coronary artery disease    a.) s/p 4v CABG in 2014   Diabetes mellitus without complication (HCC)    Diabetic neuropathy (HCC)    Diabetic peripheral neuropathy (HCC)    Diabetic ulcer of toe of left foot associated with diabetes mellitus of other type, limited to breakdown of skin (HCC) 12/21/2017   Diverticulosis    Gout    Gram-negative bacteremia 05/11/2023   Heart murmur    History of osteomyelitis 05/31/2015   Hypercholesteremia    Hyperlipidemia    Hypertension    Hypotension due to hypovolemia 08/17/2022   Infection of left foot 08/19/2022   MSSA bacteremia 02/28/2023   Open wound of left foot with complication 05/10/2023   Osteomyelitis (HCC) 05/31/2015   Peripheral neuropathy    Postural dizziness with presyncope 02/26/2023   S/P BKA (below knee amputation) unilateral, 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  Take 1 tablet (875 mg total) by mouth every 12 (twelve) hours for 7 days   03/09/2024   cyanocobalamin  (VITAMIN B12) 1000 MCG tablet Take 1 tablet (1,000 mcg total) by mouth daily.   03/09/2024   ELIQUIS  5 MG TABS tablet TAKE 1 TABLET(5 MG) BY MOUTH TWICE DAILY 60 tablet 2 03/10/2024   gabapentin  (NEURONTIN ) 300 MG capsule Take 1 capsule (300 mg total) by mouth 3 (three) times daily. 90 capsule 0 03/10/2024   insulin  glargine (LANTUS ) 100 UNIT/ML Solostar Pen Inject 28 Units into the skin at bedtime. (Patient taking differently: Inject 28 Units into the skin daily.)   03/09/2024   metFORMIN  (GLUCOPHAGE -XR) 500 MG 24 hr tablet Take 1,000 mg by mouth 2 (two) times daily with a meal.   03/10/2024   metoprolol  tartrate (LOPRESSOR ) 25 MG tablet Take 0.5 tablets (12.5 mg total) by mouth 2 (two) times daily.   03/10/2024   OZEMPIC, 1 MG/DOSE, 4 MG/3ML SOPN Inject 1 mg  into the skin once a week.   Past Week   pravastatin  (PRAVACHOL ) 80 MG tablet Take 80 mg by mouth daily.   03/10/2024   traZODone  (DESYREL ) 50 MG tablet Take 50 mg by mouth at bedtime.   03/09/2024   acetaminophen  (TYLENOL ) 325 MG tablet Take 2 tablets (650 mg total) by mouth every 6 (six) hours as needed for mild pain (pain score 1-3) or fever (or Fever >/= 101). (Patient not taking: Reported on 03/07/2024) 30 tablet 0    aspirin  EC 81 MG tablet Take 1 tablet (81 mg total) by mouth daily. Swallow whole. (Patient not taking: Reported on 03/07/2024) 90 tablet 1    Continuous Glucose Sensor (FREESTYLE LIBRE 2 SENSOR) MISC       Dulaglutide  (TRULICITY ) 0.75 MG/0.5ML SOAJ Inject 0.75 mg into the skin once a  week. Every Monday (Patient not taking: Reported on 03/12/2024)   Not Taking   glucose blood (ONETOUCH ULTRA) test strip Use 3 (three) times daily      Insulin  Pen Needle (FIFTY50 PEN NEEDLES) 32G X 4 MM MISC USE 4 TIMES DAILY (Patient not taking: Reported on 03/07/2024)      Scheduled:   gabapentin   300 mg Oral TID   insulin  aspart  0-15 Units Subcutaneous TID WC   insulin  aspart  0-5 Units Subcutaneous QHS   metoprolol  tartrate  12.5 mg Oral BID   [START ON 03/13/2024] rosuvastatin  20 mg Oral Daily   sodium chloride  flush  3 mL Intravenous Q12H   traZODone   50 mg Oral QHS   Infusions:   sodium chloride      heparin  1,000 Units/hr (03/12/24 9340)    ASSESSMENT: 73 y.o. male with PMH PAD is presenting with non-healing wounds with ulceration on his right lower extremity and Atherosclerotic occlusive disease. Patient received a percutaneous transluminal angioplasty  on 11/11. Patient was previously on Eliquis  PTA per chart review. Patient has an upcoming procedure this week and will be transitioning to a heparin  gtt on 11/12 @ 7AM per MD Schnier. Pharmacy has been consulted to initiate and manage heparin  intravenous infusion.   Goal(s) of therapy: Heparin  level 0.3 - 0.7 units/mL aPTT 66 - 102 seconds Monitor platelets by anticoagulation protocol: Yes  Date Time aPTT Rate/Comment  11/12  1427 57 Subtherapeutic @ 1000 units/hr   PLAN:  Will give 1250 units bolus x1; then increase heparin  infusion to 1150 units/hour   Check aPTT in 8 hours after infusion rate change.  Continue to titrate by aPTT until heparin  level and aPTT correlate then titrate by heparin  level alone.  Check heparin  level with next AM labs.  Continue to monitor CBC daily while on heparin  infusion.  Annabella LOISE Banks, PharmD Clinical Pharmacist 03/12/2024 4:27 PM

## 2024-03-13 ENCOUNTER — Inpatient Hospital Stay

## 2024-03-13 ENCOUNTER — Encounter: Payer: Self-pay | Admitting: Vascular Surgery

## 2024-03-13 ENCOUNTER — Inpatient Hospital Stay: Admitting: Anesthesiology

## 2024-03-13 ENCOUNTER — Encounter
Admission: AD | Disposition: A | Discharge status: Skilled Nursing Facility | Payer: Self-pay | Source: Ambulatory Visit | Attending: Vascular Surgery

## 2024-03-13 DIAGNOSIS — I2510 Atherosclerotic heart disease of native coronary artery without angina pectoris: Secondary | ICD-10-CM | POA: Diagnosis present

## 2024-03-13 DIAGNOSIS — Z9889 Other specified postprocedural states: Secondary | ICD-10-CM | POA: Diagnosis not present

## 2024-03-13 DIAGNOSIS — L97919 Non-pressure chronic ulcer of unspecified part of right lower leg with unspecified severity: Secondary | ICD-10-CM | POA: Diagnosis not present

## 2024-03-13 DIAGNOSIS — L97909 Non-pressure chronic ulcer of unspecified part of unspecified lower leg with unspecified severity: Secondary | ICD-10-CM

## 2024-03-13 DIAGNOSIS — I10 Essential (primary) hypertension: Secondary | ICD-10-CM | POA: Diagnosis not present

## 2024-03-13 DIAGNOSIS — T7840XA Allergy, unspecified, initial encounter: Secondary | ICD-10-CM | POA: Diagnosis present

## 2024-03-13 DIAGNOSIS — I70299 Other atherosclerosis of native arteries of extremities, unspecified extremity: Secondary | ICD-10-CM | POA: Diagnosis not present

## 2024-03-13 DIAGNOSIS — E1122 Type 2 diabetes mellitus with diabetic chronic kidney disease: Secondary | ICD-10-CM

## 2024-03-13 DIAGNOSIS — Z794 Long term (current) use of insulin: Secondary | ICD-10-CM

## 2024-03-13 DIAGNOSIS — I482 Chronic atrial fibrillation, unspecified: Secondary | ICD-10-CM | POA: Diagnosis present

## 2024-03-13 DIAGNOSIS — M869 Osteomyelitis, unspecified: Secondary | ICD-10-CM

## 2024-03-13 DIAGNOSIS — I70239 Atherosclerosis of native arteries of right leg with ulceration of unspecified site: Secondary | ICD-10-CM | POA: Diagnosis not present

## 2024-03-13 DIAGNOSIS — E785 Hyperlipidemia, unspecified: Secondary | ICD-10-CM | POA: Diagnosis present

## 2024-03-13 DIAGNOSIS — N1832 Chronic kidney disease, stage 3b: Secondary | ICD-10-CM

## 2024-03-13 HISTORY — PX: IRRIGATION AND DEBRIDEMENT FOOT: SHX6602

## 2024-03-13 HISTORY — PX: APPLICATION OF WOUND VAC: SHX5189

## 2024-03-13 LAB — CBC
HCT: 36.6 % — ABNORMAL LOW (ref 39.0–52.0)
Hemoglobin: 11.6 g/dL — ABNORMAL LOW (ref 13.0–17.0)
MCH: 27.2 pg (ref 26.0–34.0)
MCHC: 31.7 g/dL (ref 30.0–36.0)
MCV: 85.7 fL (ref 80.0–100.0)
Platelets: 273 K/uL (ref 150–400)
RBC: 4.27 MIL/uL (ref 4.22–5.81)
RDW: 14 % (ref 11.5–15.5)
WBC: 7.6 K/uL (ref 4.0–10.5)
nRBC: 0 % (ref 0.0–0.2)

## 2024-03-13 LAB — BASIC METABOLIC PANEL WITH GFR
Anion gap: 12 (ref 5–15)
BUN: 16 mg/dL (ref 8–23)
CO2: 22 mmol/L (ref 22–32)
Calcium: 8.8 mg/dL — ABNORMAL LOW (ref 8.9–10.3)
Chloride: 104 mmol/L (ref 98–111)
Creatinine, Ser: 1.37 mg/dL — ABNORMAL HIGH (ref 0.61–1.24)
GFR, Estimated: 54 mL/min — ABNORMAL LOW (ref >60)
Glucose, Bld: 352 mg/dL — ABNORMAL HIGH (ref 70–99)
Potassium: 4.4 mmol/L (ref 3.5–5.1)
Sodium: 138 mmol/L (ref 135–145)

## 2024-03-13 LAB — APTT: aPTT: 60 s — ABNORMAL HIGH (ref 24–36)

## 2024-03-13 LAB — GLUCOSE, CAPILLARY
Glucose-Capillary: 160 mg/dL — ABNORMAL HIGH (ref 70–99)
Glucose-Capillary: 99 mg/dL (ref 70–99)
Glucose-Capillary: 116 mg/dL — ABNORMAL HIGH (ref 70–99)
Glucose-Capillary: 126 mg/dL — ABNORMAL HIGH (ref 70–99)
Glucose-Capillary: 295 mg/dL — ABNORMAL HIGH (ref 70–99)
Glucose-Capillary: 309 mg/dL — ABNORMAL HIGH (ref 70–99)

## 2024-03-13 LAB — HEPARIN LEVEL (UNFRACTIONATED)
Heparin Unfractionated: 0.58 [IU]/mL (ref 0.30–0.70)
Heparin Unfractionated: 0.5 [IU]/mL (ref 0.30–0.70)

## 2024-03-13 SURGERY — IRRIGATION AND DEBRIDEMENT FOOT
Anesthesia: General | Site: Foot | Laterality: Right

## 2024-03-13 MED ORDER — SODIUM CHLORIDE 0.9 % IV SOLN
3.0000 g | Freq: Three times a day (TID) | INTRAVENOUS | Status: DC
  Administered 2024-03-13 – 2024-03-16 (×8): 3 g via INTRAVENOUS
  Filled 2024-03-13 (×9): qty 8

## 2024-03-13 MED ORDER — BUPIVACAINE HCL (PF) 0.5 % IJ SOLN
INTRAMUSCULAR | Status: AC
  Filled 2024-03-13: qty 30

## 2024-03-13 MED ORDER — METOPROLOL TARTRATE 5 MG/5ML IV SOLN: 2.5000 mg | INTRAVENOUS | Status: DC | PRN

## 2024-03-13 MED ORDER — LIDOCAINE HCL (CARDIAC) PF 100 MG/5ML IV SOSY
PREFILLED_SYRINGE | INTRAVENOUS | Status: DC | PRN
  Administered 2024-03-13: 80 mg via INTRAVENOUS

## 2024-03-13 MED ORDER — CHLORHEXIDINE GLUCONATE 4 % EX SOLN: 60.0000 mL | Freq: Once | CUTANEOUS | Status: DC

## 2024-03-13 MED ORDER — SODIUM CHLORIDE 0.9 % IV SOLN
25.0000 mg | INTRAVENOUS | Status: AC
  Filled 2024-03-13: qty 0.5

## 2024-03-13 MED ORDER — FENTANYL CITRATE (PF) 100 MCG/2ML IJ SOLN
INTRAMUSCULAR | Status: DC | PRN
  Administered 2024-03-13: 25 ug via INTRAVENOUS

## 2024-03-13 MED ORDER — VANCOMYCIN HCL 1000 MG IV SOLR
INTRAVENOUS | Status: AC
  Filled 2024-03-13: qty 20

## 2024-03-13 MED ORDER — CEFAZOLIN SODIUM-DEXTROSE 2-4 GM/100ML-% IV SOLN
2.0000 g | INTRAVENOUS | Status: AC
  Administered 2024-03-13: 2 g via INTRAVENOUS

## 2024-03-13 MED ORDER — SODIUM CHLORIDE 0.9 % IV SOLN: INTRAVENOUS | Status: DC | PRN

## 2024-03-13 MED ORDER — DEXAMETHASONE SODIUM PHOSPHATE 4 MG/ML IJ SOLN
INTRAMUSCULAR | Status: DC | PRN
  Administered 2024-03-13: 4 mg via INTRAVENOUS

## 2024-03-13 MED ORDER — FENTANYL CITRATE (PF) 100 MCG/2ML IJ SOLN
INTRAMUSCULAR | Status: AC
  Filled 2024-03-13: qty 2

## 2024-03-13 MED ORDER — ONDANSETRON HCL 4 MG/2ML IJ SOLN
INTRAMUSCULAR | Status: DC | PRN
Start: 2024-03-13 — End: 2024-03-13
  Administered 2024-03-13: 4 mg via INTRAVENOUS

## 2024-03-13 MED ORDER — POVIDONE-IODINE 10 % EX SWAB: 2.0000 | Freq: Once | CUTANEOUS | Status: DC

## 2024-03-13 MED ORDER — PROPOFOL 10 MG/ML IV BOLUS
INTRAVENOUS | Status: AC
Start: 2024-03-13 — End: 2024-03-13
  Filled 2024-03-13: qty 20

## 2024-03-13 MED ORDER — ROCURONIUM BROMIDE 100 MG/10ML IV SOLN
INTRAVENOUS | Status: DC | PRN
  Administered 2024-03-13: 50 mg via INTRAVENOUS

## 2024-03-13 MED ORDER — BUPIVACAINE HCL 0.5 % IJ SOLN
INTRAMUSCULAR | Status: DC | PRN
  Administered 2024-03-13: 20 mL

## 2024-03-13 MED ORDER — HEPARIN (PORCINE) 25000 UT/250ML-% IV SOLN
1300.0000 [IU]/h | INTRAVENOUS | Status: DC
  Administered 2024-03-14: 1300 [IU]/h via INTRAVENOUS
  Filled 2024-03-13 (×2): qty 250

## 2024-03-13 MED ORDER — SODIUM CHLORIDE 0.9 % IV SOLN
2.0000 g | Freq: Three times a day (TID) | INTRAVENOUS | Status: DC
  Administered 2024-03-13: 2 g via INTRAVENOUS
  Filled 2024-03-13 (×2): qty 12.5

## 2024-03-13 MED ORDER — 0.9 % SODIUM CHLORIDE (POUR BTL) OPTIME
TOPICAL | Status: DC | PRN
  Administered 2024-03-13: 500 mL

## 2024-03-13 MED ORDER — PHENYLEPHRINE 80 MCG/ML (10ML) SYRINGE FOR IV PUSH (FOR BLOOD PRESSURE SUPPORT)
PREFILLED_SYRINGE | INTRAVENOUS | Status: AC
  Filled 2024-03-13: qty 10

## 2024-03-13 MED ORDER — SURGIFLO WITH THROMBIN (HEMOSTATIC MATRIX KIT) OPTIME
TOPICAL | Status: DC | PRN
  Administered 2024-03-13: 1 via TOPICAL

## 2024-03-13 MED ORDER — SODIUM CHLORIDE 0.9 % IR SOLN
Status: DC | PRN
  Administered 2024-03-13: 500 mL

## 2024-03-13 MED ORDER — PHENYLEPHRINE 80 MCG/ML (10ML) SYRINGE FOR IV PUSH (FOR BLOOD PRESSURE SUPPORT)
PREFILLED_SYRINGE | INTRAVENOUS | Status: DC | PRN
  Administered 2024-03-13 (×7): 80 ug via INTRAVENOUS
  Administered 2024-03-13: 160 ug via INTRAVENOUS
  Administered 2024-03-13: 80 ug via INTRAVENOUS

## 2024-03-13 MED ORDER — DIPHENHYDRAMINE HCL 50 MG/ML IJ SOLN: 25.0000 mg | Freq: Three times a day (TID) | INTRAMUSCULAR | Status: DC | PRN

## 2024-03-13 MED ORDER — HEPARIN BOLUS VIA INFUSION
1200.0000 [IU] | Freq: Once | INTRAVENOUS | Status: AC
  Administered 2024-03-13: 1200 [IU] via INTRAVENOUS
  Filled 2024-03-13: qty 1200

## 2024-03-13 MED ORDER — FENTANYL CITRATE (PF) 100 MCG/2ML IJ SOLN: 25.0000 ug | INTRAMUSCULAR | Status: DC | PRN

## 2024-03-13 MED ORDER — INSULIN GLARGINE-YFGN 100 UNIT/ML ~~LOC~~ SOLN
14.0000 [IU] | Freq: Every day | SUBCUTANEOUS | Status: DC
  Administered 2024-03-13 – 2024-03-19 (×7): 14 [IU] via SUBCUTANEOUS
  Filled 2024-03-13 (×8): qty 0.14

## 2024-03-13 MED ORDER — SUGAMMADEX SODIUM 200 MG/2ML IV SOLN
INTRAVENOUS | Status: DC | PRN
  Administered 2024-03-13: 180 mg via INTRAVENOUS

## 2024-03-13 MED ORDER — PROPOFOL 10 MG/ML IV BOLUS
INTRAVENOUS | Status: DC | PRN
  Administered 2024-03-13: 40 mg via INTRAVENOUS
  Administered 2024-03-13: 160 mg via INTRAVENOUS

## 2024-03-13 MED ORDER — ONDANSETRON HCL 4 MG/2ML IJ SOLN: 4.0000 mg | Freq: Once | INTRAMUSCULAR | Status: DC | PRN

## 2024-03-13 SURGICAL SUPPLY — 55 items
BLADE OSC/SAGITTAL MD 5.5X18 (BLADE) IMPLANT
BLADE SURG 15 STRL LF DISP TIS (BLADE) ×1 IMPLANT
BLADE SW THK.38XMED LNG THN (BLADE) IMPLANT
BNDG COHESIVE 4X5 TAN STRL LF (GAUZE/BANDAGES/DRESSINGS) ×1 IMPLANT
BNDG COHESIVE 6X5 TAN ST LF (GAUZE/BANDAGES/DRESSINGS) ×1 IMPLANT
BNDG ELASTIC 4INX 5YD STR LF (GAUZE/BANDAGES/DRESSINGS) ×1 IMPLANT
BNDG ESMARCH 4X12 STRL LF (GAUZE/BANDAGES/DRESSINGS) ×1 IMPLANT
BNDG GAUZE DERMACEA FLUFF 4 (GAUZE/BANDAGES/DRESSINGS) ×1 IMPLANT
BNDG STRETCH GAUZE 3IN X12FT (GAUZE/BANDAGES/DRESSINGS) ×1 IMPLANT
CANISTER WOUND CARE 500ML ATS (WOUND CARE) ×1 IMPLANT
CUFF TOURN SGL QUICK 12 (TOURNIQUET CUFF) IMPLANT
CUFF TOURN SGL QUICK 18X4 (TOURNIQUET CUFF) IMPLANT
DRAPE FLUOR MINI C-ARM 54X84 (DRAPES) IMPLANT
DRAPE XRAY CASSETTE 23X24 (DRAPES) IMPLANT
DRSG MEPILEX FLEX 3X3 (GAUZE/BANDAGES/DRESSINGS) IMPLANT
DURAPREP 26ML APPLICATOR (WOUND CARE) ×1 IMPLANT
ELECTRODE REM PT RTRN 9FT ADLT (ELECTROSURGICAL) ×1 IMPLANT
GAUZE PACKING 0.25INX5YD STRL (GAUZE/BANDAGES/DRESSINGS) ×1 IMPLANT
GAUZE SPONGE 4X4 12PLY STRL (GAUZE/BANDAGES/DRESSINGS) ×1 IMPLANT
GAUZE STRETCH 2X75IN STRL (MISCELLANEOUS) ×1 IMPLANT
GAUZE XEROFORM 1X8 LF (GAUZE/BANDAGES/DRESSINGS) ×1 IMPLANT
GLOVE BIO SURGEON STRL SZ7.5 (GLOVE) ×1 IMPLANT
GLOVE BIOGEL PI IND STRL 8 (GLOVE) ×1 IMPLANT
GOWN STRL REUS W/ TWL LRG LVL3 (GOWN DISPOSABLE) ×1 IMPLANT
GOWN STRL REUS W/TWL MED LVL3 (GOWN DISPOSABLE) ×1 IMPLANT
GOWN STRL REUS W/TWL XL LVL4 (GOWN DISPOSABLE) ×1 IMPLANT
HANDPIECE VERSAJET DEBRIDEMENT (MISCELLANEOUS) IMPLANT
IV 0.9% NACL 1000 ML (IV SOLUTION) ×1 IMPLANT
KIT DRSG VAC SLVR GRANUFM (MISCELLANEOUS) ×1 IMPLANT
KIT TURNOVER KIT A (KITS) ×1 IMPLANT
LABEL OR SOLS (LABEL) ×1 IMPLANT
MANIFOLD NEPTUNE II (INSTRUMENTS) ×1 IMPLANT
NDL FILTER BLUNT 18X1 1/2 (NEEDLE) ×1 IMPLANT
NDL HYPO 25X1 1.5 SAFETY (NEEDLE) ×1 IMPLANT
NEEDLE FILTER BLUNT 18X1 1/2 (NEEDLE) ×1 IMPLANT
NEEDLE HYPO 25X1 1.5 SAFETY (NEEDLE) ×1 IMPLANT
NS IRRIG 500ML POUR BTL (IV SOLUTION) ×1 IMPLANT
PACK EXTREMITY ARMC (MISCELLANEOUS) ×1 IMPLANT
PACKING GAUZE IODOFORM 1INX5YD (GAUZE/BANDAGES/DRESSINGS) ×1 IMPLANT
PAD ABD DERMACEA PRESS 5X9 (GAUZE/BANDAGES/DRESSINGS) ×1 IMPLANT
PENCIL SMOKE EVACUATOR (MISCELLANEOUS) ×1 IMPLANT
RASP SM TEAR CROSS CUT (RASP) IMPLANT
SHIELD FULL FACE ANTIFOG 7M (MISCELLANEOUS) ×1 IMPLANT
SOL .9 NS 3000ML IRR UROMATIC (IV SOLUTION) ×1 IMPLANT
STOCKINETTE IMPERVIOUS 9X36 MD (GAUZE/BANDAGES/DRESSINGS) ×1 IMPLANT
SURGIFLO W/THROMBIN 8M KIT (HEMOSTASIS) IMPLANT
SUT ETHILON 2 0 FS 18 (SUTURE) IMPLANT
SUT VIC AB 3-0 SH 27X BRD (SUTURE) ×1 IMPLANT
SUTURE EHLN 3-0 FS-10 30 BLK (SUTURE) IMPLANT
SWAB CULTURE AMIES ANAERIB BLU (MISCELLANEOUS) IMPLANT
SYR 10ML LL (SYRINGE) ×1 IMPLANT
SYR 3ML LL SCALE MARK (SYRINGE) ×1 IMPLANT
TIP FAN IRRIG PULSAVAC PLUS (DISPOSABLE) ×1 IMPLANT
TRAP FLUID SMOKE EVACUATOR (MISCELLANEOUS) ×1 IMPLANT
WATER STERILE IRR 500ML POUR (IV SOLUTION) ×1 IMPLANT

## 2024-03-13 NOTE — Anesthesia Procedure Notes (Signed)
 Procedure Name: Intubation Date/Time: 03/13/2024 12:39 PM  Performed by: Dyane Mass, CRNAPre-anesthesia Checklist: Patient identified, Emergency Drugs available, Suction available and Patient being monitored Patient Re-evaluated:Patient Re-evaluated prior to induction Oxygen Delivery Method: Circle system utilized Preoxygenation: Pre-oxygenation with 100% oxygen Induction Type: IV induction and Rapid sequence Laryngoscope Size: McGrath and 4 Grade View: Grade I Tube type: Oral Tube size: 7.5 mm Number of attempts: 1 Airway Equipment and Method: Stylet and Oral airway Placement Confirmation: ETT inserted through vocal cords under direct vision, positive ETCO2 and breath sounds checked- equal and bilateral Secured at: 22 cm Tube secured with: Tape Dental Injury: Teeth and Oropharynx as per pre-operative assessment

## 2024-03-13 NOTE — Progress Notes (Signed)
   03/13/24 1539  Spiritual Encounters  Type of Visit Initial  Care provided to: Pt not available  Conversation partners present during encounter Other (comment) (Rapid Response Team)  Referral source Code page  Reason for visit Code  OnCall Visit Yes  Interventions  Spiritual Care Interventions Made Compassionate presence  Intervention Outcomes  Outcomes Other (comment) (Chaplain prayed silently for Pt.)  Spiritual Care Plan  Spiritual Care Issues Still Outstanding Referring to oncoming chaplain for further support

## 2024-03-13 NOTE — H&P (Signed)
 HISTORY AND PHYSICAL INTERVAL NOTE:  03/13/2024  12:28 PM  Andrew Harding  has presented today for surgery, with the diagnosis of Ulcer right foot.  The various methods of treatment have been discussed with the patient.  No guarantees were given.  After consideration of risks, benefits and other options for treatment, the patient has consented to surgery.  I have reviewed the patients' chart and labs.      The patient was reexamined.  There have been no changes to this history and physical examination.  [name] A

## 2024-03-13 NOTE — Consult Note (Signed)
 Medical Consultation   Andrew Harding  FMW:[id number]  DOB: 1951-02-09  DOA: 03/11/2024  PCP: Fernande Ophelia [name], MD   Outpatient Specialists:    Requesting physician: -Dr. Jama of [facility]  Reason for consultation: -Allergic reaction to cefepime  and the tachycardia  History of Present Illness: Andrew Harding is an 73 y.o. male with PMH of PVD, hypertension, hyperlipidemia, CAD, CABG, gout, CKD stage IIIa, A-fib on Eliquis , peripheral neuropathy, who was admitted due to atherosclerosis of artery of extremity with ulceration and right foot diabetic foot ulcer with infection. We are asked to consult due to allergic reaction to cefepime .    Pt has atherosclerotic occlusive disease bilateral lower extremities with ulceration and nonhealing wounds of the right lower extremity (right foot). Pt is s/p SEE ABOVE 2 Days Post-Op by Dr. Jama.  Patient also had excision fifth metatarsal right foot, with application of wound VAC right foot by Dr. Ashley of podiatry.   Per RN report, pt developed itching all over and tachycardia with heart rate up to 160 after received 1 dose of cefepime .  Patient was given 25 mg of IV Benadryl . His symptoms have resolved.  When I saw patient on the floor, he does not have SOB or chest pain, no rash, heart rate down to 70s.  No fever or chills.  Denies nausea vomiting, diarrhea or abdominal pain.  No symptoms of UTI.  Date reviewed, lab, image and vitals: WBC 7.6, renal function close to baseline, temperature normal, blood pressure 106/69, RR 21, oxygen saturation 100% on 2 L oxygen.   EKG Not done in ED yet, will get one, stat  Review of Systems:   General: no fevers, chills, no changes in body weight, no changes in appetite Skin: no rash HEENT: no blurry vision, hearing changes or sore throat Pulm: no dyspnea, coughing, wheezing CV: no chest pain, palpitations, shortness of breath Abd: no nausea/vomiting, abdominal pain, diarrhea/constipation GU:  no dysuria, hematuria, polyuria Ext: has right foot pain Neuro: no weakness, numbness, or tingling    Past Medical History: 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) 09/03/2023   Atrial fibrillation with RVR (HCC) 08/19/2022   Bladder neck obstruction    Cellulitis 08/17/2022   Chronic kidney disease    Coronary artery disease    a.) s/p 4v CABG in 2014   Diabetes mellitus without complication (HCC)    Diabetic neuropathy (HCC)    Diabetic peripheral neuropathy (HCC)    Diabetic ulcer of toe of left foot associated with diabetes mellitus of other type, limited to breakdown of skin (HCC) 12/21/2017   Diverticulosis    Gout    Gram-negative bacteremia 05/11/2023   Heart murmur    History of osteomyelitis 05/31/2015   Hypercholesteremia    Hyperlipidemia    Hypertension    Hypotension due to hypovolemia 08/17/2022   Infection of left foot 08/19/2022   MSSA bacteremia 02/28/2023   Open wound of left foot with complication 05/10/2023   Osteomyelitis (HCC) 05/31/2015   Peripheral neuropathy    Postural dizziness with presyncope 02/26/2023   S/P BKA (below knee amputation) unilateral, 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   S/P transmetatarsal amputation of foot, left (HCC) 08/29/2022   Sepsis (HCC)  08/17/2022   Sepsis (HCC) 05/10/2023   Status post amputation of toe of right foot 11/01/2015   Tubular adenoma    Vitamin D  deficiency     Past Surgical History: Past Surgical History:  Procedure Laterality Date   ABDOMINAL AORTOGRAM W/LOWER EXTREMITY N/A 11/05/2023   Procedure: ABDOMINAL AORTOGRAM W/LOWER EXTREMITY;  Surgeon: Marea Selinda RAMAN, MD;  Location: [facility];  Service: Cardiovascular;  Laterality: N/A;   ACHILLES TENDON SURGERY Right 09/14/2023   Procedure: TENOTOMY, ACHILLES;   Surgeon: [name], DPM;  Location: [facility];  Service: Orthopedics/Podiatry;  Laterality: Right;   AMPUTATION Left 08/19/2022   Procedure: TRANSMETATARSAL AMPUTATION LEFT FOOT WITH IRRIGATION AND DEBRIDEMENT;  Surgeon: Lennie Barter, DPM;  Location: [facility];  Service: Podiatry;  Laterality: Left;   AMPUTATION Left 05/16/2023   Procedure: AMPUTATION BELOW KNEE;  Surgeon: Marea Selinda RAMAN, MD;  Location: [facility];  Service: General;  Laterality: Left;   AMPUTATION Right 09/14/2023   Procedure: AMPUTATION, FOOT, RAY;  Surgeon: [name], DPM;  Location: [facility];  Service: Orthopedics/Podiatry;  Laterality: Right;   AMPUTATION TOE Right 06/01/2015   Procedure: AMPUTATION TOE;  Surgeon: Donnice Cory, DPM;  Location: [facility];  Service: Podiatry;  Laterality: Right;   AMPUTATION TOE Left 08/11/2022   Procedure: AMPUTATION TOE 2, 3, 4;  Surgeon: [name], DPM;  Location: [facility];  Service: Podiatry;  Laterality: Left;   CATARACT EXTRACTION, BILATERAL     CIRCUMCISION N/A 06/12/2022   Procedure: CIRCUMCISION ADULT;  Surgeon: Penne Knee, MD;  Location: [facility];  Service: Urology;  Laterality: N/A;   COLONOSCOPY WITH PROPOFOL  N/A 02/14/2016   Procedure: COLONOSCOPY WITH PROPOFOL ;  Surgeon: Gladis RAYMOND Mariner, MD;  Location: [facility] ENDOSCOPY;  Service: Endoscopy;  Laterality: N/A;   COLONOSCOPY WITH PROPOFOL  N/A 01/07/2019   Procedure: COLONOSCOPY WITH PROPOFOL ;  Surgeon: Mariner Gladis RAYMOND, MD;  Location: [facility]OSCOPY;  Service: Endoscopy;  Laterality: N/A;   CORONARY ARTERY BYPASS GRAFT N/A 08/2012   EXCISION PARTIAL PHALANX Right 06/01/2015   Procedure: EXCISION PARTIAL PHALANX /  BONE;  Surgeon: Donnice Cory, DPM;  Location: [facility];  Service: Podiatry;  Laterality: Right;   FLEXIBLE SIGMOIDOSCOPY N/A 05/29/2016   Procedure: FLEXIBLE SIGMOIDOSCOPY;  Surgeon: Gladis RAYMOND Mariner, MD;  Location: [facility] ENDOSCOPY;  Service: Endoscopy;  Laterality: N/A;   INCISION AND DRAINAGE Left  08/23/2022   Procedure: INCISION AND DRAINAGE;  Surgeon: [name], DPM;  Location: [facility];  Service: Podiatry;  Laterality: Left;   INCISION AND DRAINAGE OF WOUND Left 09/15/2022   Procedure: 11044 - DEBRIDE BONE and EXCISION IF 1ST METATARSAL BONE WITH  DELAY PRIMARY CLOSURE;  Surgeon: [name], DPM;  Location: [facility];  Service: Podiatry;  Laterality: Left;   IRRIGATION AND DEBRIDEMENT FOOT Left 02/28/2023   Procedure: IRRIGATION AND DEBRIDEMENT FOOT;  Surgeon: [name], DPM;  Location: [facility];  Service: Orthopedics/Podiatry;  Laterality: Left;   IRRIGATION AND DEBRIDEMENT FOOT Right 01/11/2024   Procedure: IRRIGATION AND DEBRIDEMENT FOOT;  Surgeon: [name], DPM;  Location: [facility];  Service: Orthopedics/Podiatry;  Laterality: Right;   KNEE ARTHROSCOPY Left    LOWER EXTREMITY ANGIOGRAPHY Left 08/22/2022   Procedure: Lower Extremity Angiography;  Surgeon: Jama Cordella MATSU, MD;  Location: [facility];  Service: Cardiovascular;  Laterality: Left;   LOWER EXTREMITY ANGIOGRAPHY Left 05/11/2023   Procedure: Lower Extremity Angiography;  Surgeon: Marea Selinda RAMAN, MD;  Location: [facility];  Service: Cardiovascular;  Laterality: Left;   LOWER EXTREMITY  ANGIOGRAPHY Right 09/11/2023   Procedure: Lower Extremity Angiography;  Surgeon: Jama Cordella MATSU, MD;  Location: [facility] INVASIVE CV LAB;  Service: Cardiovascular;  Laterality: Right;   LOWER EXTREMITY ANGIOGRAPHY Right 01/15/2024   Procedure: Lower Extremity Angiography;  Surgeon: Jama Cordella MATSU, MD;  Location: [facility];  Service: Cardiovascular;  Laterality: Right;   LOWER EXTREMITY ANGIOGRAPHY Left 03/11/2024   Procedure: Lower Extremity Angiography;  Surgeon: Jama Cordella MATSU, MD;  Location: [facility];  Service: Cardiovascular;  Laterality: Left;   LOWER EXTREMITY INTERVENTION Right 09/11/2023   Procedure: LOWER EXTREMITY INTERVENTION;  Surgeon: Jama Cordella MATSU, MD;  Location:  [facility];  Service: Cardiovascular;  Laterality: Right;   TEE WITHOUT CARDIOVERSION N/A 03/01/2023   Procedure: TRANSESOPHAGEAL ECHOCARDIOGRAM;  Surgeon: Alluri, Keller BROCKS, MD;  Location: [facility];  Service: Cardiovascular;  Laterality: N/A;   TRANSMETATARSAL AMPUTATION Right 11/02/2023   Procedure: AMPUTATION, FOOT, TRANSMETATARSAL;  Surgeon: [name], DPM;  Location: [facility];  Service: Orthopedics/Podiatry;  Laterality: Right;   TRANSMETATARSAL AMPUTATION Right 12/21/2023   Procedure: REVISION, AMPUTATION, FOOT, TRANSMETATARSAL;  Surgeon: [name], DPM;  Location: [facility];  Service: Orthopedics/Podiatry;  Laterality: Right;   WOUND DEBRIDEMENT Left 09/15/2022   Procedure: 11043 - DEBRIDE SKIN. MUSCLE FASCIA;  Surgeon: [name], DPM;  Location: [facility];  Service: Podiatry;  Laterality: Left;     Allergies:   Allergies  Allergen Reactions   Cefepime  Itching     Social History:  reports that he has quit smoking. His smoking use included cigars. He has never used smokeless tobacco. He reports that he does not drink alcohol and does not use drugs.  Family History: Family History  Problem Relation Age of Onset   Diabetes Mellitus II Mother    CAD Mother      Physical Exam: Vitals:   03/13/24 2033 03/13/24 2034 03/14/24 0024 03/14/24 0026  BP: 109/76  113/79 113/79  Pulse: 72 91 81 83  Resp: 20  20 20   Temp: 98.2 F (36.8 C)  98.5 F (36.9 C) 98.5 F (36.9 C)  TempSrc:      SpO2: 99% 99% 99% 99%  Weight:      Height:         General: Not in acute distress HEENT:       Eyes: PERRL, EOMI, no scleral icterus.       ENT: No discharge from the ears and nose, no pharynx injection, no tonsillar enlargement.        Neck: No JVD, no bruit, no mass felt. Heme: No neck lymph node enlargement. Cardiac: S1/S2, RRR, No murmurs, No gallops or rubs. Respiratory: No rales, wheezing, rhonchi or rubs. GI: Soft, nondistended, nontender, no rebound pain, no  organomegaly, BS present. GU: No hematuria Ext: No pitting leg edema bilaterally. 1+DP/PT pulse bilaterally. Musculoskeletal: No joint deformities, No joint redness or warmth, no limitation of ROM in spin. Skin: No rashes.  Neuro: Alert, oriented X3, cranial nerves II-XII grossly intact, moves all extremities normally. Muscle strength 5/5 in all extremities, sensation to light touch intact. Brachial reflex 2+ bilaterally. Knee reflex 1+ bilaterally. Negative Babinski's sign. Normal finger to nose test. Psych: Patient is not psychotic, no suicidal or hemocidal ideation.    Data reviewed:  I have personally reviewed following labs and imaging studies Labs:  CBC: Recent Labs  Lab 03/12/24 0553 03/13/24 0520  WBC 8.7 7.6  HGB 11.2* 11.6*  HCT 34.3* 36.6*  MCV 83.5 85.7  PLT 271  273    Basic Metabolic Panel: Recent Labs  Lab 03/11/24 1505 03/13/24 2022  NA  --  138  K  --  4.4  CL  --  104  CO2  --  22  GLUCOSE  --  352*  BUN 17 16  CREATININE 1.25* 1.37*  CALCIUM  --  8.8*   GFR Estimated Creatinine Clearance: 54.9 mL/min (A) (by C-G formula based on SCr of 1.37 mg/dL (H)). Liver Function Tests: No results for input(s): AST, ALT, ALKPHOS, BILITOT, PROT, ALBUMIN in the last 168 hours. No results for input(s): LIPASE, AMYLASE in the last 168 hours. No results for input(s): AMMONIA in the last 168 hours. Coagulation profile No results for input(s): INR, PROTIME in the last 168 hours.  Cardiac Enzymes: No results for input(s): CKTOTAL, CKMB, CKMBINDEX, TROPONINI in the last 168 hours. BNP: Invalid input(s): POCBNP CBG: Recent Labs  Lab 03/13/24 1210 03/13/24 1347 03/13/24 1506 03/13/24 1700 03/13/24 2058  GLUCAP 99 116* 126* 295* 309*   D-Dimer No results for input(s): DDIMER in the last 72 hours. Hgb A1c No results for input(s): HGBA1C in the last 72 hours. Lipid Profile No results for input(s): CHOL, HDL, LDLCALC,  TRIG, CHOLHDL, LDLDIRECT in the last 72 hours. Thyroid function studies No results for input(s): TSH, T4TOTAL, T3FREE, THYROIDAB in the last 72 hours.  Invalid input(s): FREET3 Anemia work up No results for input(s): VITAMINB12, FOLATE, FERRITIN, TIBC, IRON , RETICCTPCT in the last 72 hours. Urinalysis    Component Value Date/Time   COLORURINE YELLOW (A) 05/10/2023 0937   APPEARANCEUR HAZY (A) 05/10/2023 0937   APPEARANCEUR Hazy (A) 05/30/2022 1026   LABSPEC 1.017 05/10/2023 0937   PHURINE 5.0 05/10/2023 0937   GLUCOSEU 50 (A) 05/10/2023 0937   HGBUR MODERATE (A) 05/10/2023 0937   BILIRUBINUR NEGATIVE 05/10/2023 0937   BILIRUBINUR Negative 05/30/2022 1026   KETONESUR NEGATIVE 05/10/2023 0937   PROTEINUR 100 (A) 05/10/2023 0937   UROBILINOGEN 0.2 04/05/2018 1042   NITRITE NEGATIVE 05/10/2023 0937   LEUKOCYTESUR NEGATIVE 05/10/2023 9062     Microbiology Recent Results (from the past 240 hours)  Aerobic/Anaerobic Culture w Gram Stain (surgical/deep wound)     Status: None (Preliminary result)   Collection Time: 03/13/24  1:00 PM   Specimen: Path fluid; Body Fluid  Result Value Ref Range Status   Specimen Description ABSCESS  Final   Special Requests RIGHT FIFTH METATARSAL  Final   Gram Stain   Final    NO WBC SEEN NO ORGANISMS SEEN Performed at [facility], [address], [place], [place] [address]    Culture PENDING  Incomplete   Report Status PENDING  Incomplete       Inpatient Medications:   Scheduled Meds:  gabapentin   300 mg Oral TID   insulin  aspart  0-15 Units Subcutaneous TID WC   insulin  aspart  0-5 Units Subcutaneous QHS   insulin  glargine-yfgn  14 Units Subcutaneous Q2200   metoprolol  tartrate  12.5 mg Oral BID   rosuvastatin  20 mg Oral Daily   sodium chloride  flush  3 mL Intravenous Q12H   traZODone   50 mg Oral QHS   Continuous Infusions:  ampicillin -sulbactam (UNASYN ) IV 3 g (03/13/24 2154)   diphenhydrAMINE       heparin  1,300 Units/hr (03/13/24 1849)     Radiological Exams on Admission: DG MINI C-ARM IMAGE ONLY Result Date: 03/13/2024 There is no interpretation for this exam.  This order is for images obtained during a surgical procedure.  Please See Surgeries Tab for more information regarding the procedure.    Impression/Recommendations Principal Problem:   Atherosclerosis of artery of extremity with ulceration (HCC) Active Problems:   Diabetic foot infection (HCC)   Chronic atrial fibrillation with RVR (HCC)   Allergic reaction   Benign essential hypertension   HLD (hyperlipidemia)   CAD (coronary artery disease)   Type 2 diabetes mellitus with stage 3b chronic kidney disease, with long-term current use of insulin  (HCC)    Assessment and Plan:  Atherosclerosis of artery of extremity with ulceration and diabetic foot infection-right foot:  Pt is s/p SEE ABOVE 2 Days Post-Op by Dr. Jama.  Patient also had excision fifth metatarsal right foot, with application of wound VAC right foot by Dr. Ashley of podiatry.  No fever or leukocytosis.  Patient seems to be allergic to cefepime .  -Dr. Jama switched patient to Unasyn  - As needed Percocet and Tylenol  for pain  Chronic atrial fibrillation with RVR [facility]): Patient possibly had A-fib with RVR, heart rate up to 160s.  Which is likely triggered by allergic reaction to cefepime .  Currently heart rates in 70s.  Patient is asymptomatic. -Continue home metoprolol  12.5 mg twice daily - IV metoprolol  2.5 mg every 2 hour for heart rate> 125 - Patient is IV heparin  (on Eliquis  at home).   - stat EKG  Allergic reaction: Symptoms have resolved.  Current heart rate 70s.  No rash. -As needed Benadryl  25 mg every 8 hour  Benign essential hypertension -Metoprolol  - IV hydralazine .  HLD (hyperlipidemia) -Crestor  CAD (coronary artery disease): No chest pain -Crestor, metoprolol    Type 2 diabetes mellitus with stage 3b chronic kidney  disease, with long-term current use of insulin  [facility]): Recent A1c 5.7, well-controlled.  Patient is taking metformin , Ozempic and Lantus  28 units daily - Glargine insulin  14 units daily - SSI       Thank you for this consultation.  Our [facility] hospitalist team will follow the patient with you.  Time Spent:  35     Inesha Sow M.D. [facility] 03/14/2024, 2:44 AM

## 2024-03-13 NOTE — Progress Notes (Signed)
 Reached out to Dr Jama in reference to adding allergic reaction to chart.  He stated he was reaching out to Dr Ashley and the hospitalist.

## 2024-03-13 NOTE — Anesthesia Preprocedure Evaluation (Addendum)
 Anesthesia Evaluation  Patient identified by MRN, date of birth, ID band Patient awake    Reviewed: Allergy & Precautions, NPO status , Patient's Chart, lab work & pertinent test results  Airway Mallampati: II  TM Distance: >3 FB Neck ROM: Full    Dental  (+) Teeth Intact   Pulmonary Patient abstained from smoking., former smoker   Pulmonary exam normal        Cardiovascular Exercise Tolerance: Poor hypertension, Pt. on medications + CAD, + CABG and + Peripheral Vascular Disease  Normal cardiovascular exam Rhythm:Regular Rate:Normal     Neuro/Psych negative neurological ROS  negative psych ROS   GI/Hepatic negative GI ROS, Neg liver ROS,,,  Endo/Other  diabetes, Type 1, Insulin  Dependent    Renal/GU CRFRenal disease     Musculoskeletal negative musculoskeletal ROS (+)    Abdominal Normal abdominal exam  (+)   Peds negative pediatric ROS (+)  Hematology negative hematology ROS (+) Blood dyscrasia, anemia   Anesthesia Other Findings Past Medical History: 08/24/2022: Acute osteomyelitis of left ankle or foot (HCC) 09/05/2022: AKI (acute kidney injury) 09/03/2023: 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) 08/19/2022: Atrial fibrillation with RVR (HCC) No date: Bladder neck obstruction 08/17/2022: Cellulitis No date: Chronic kidney disease No date: Coronary artery disease     Comment:  a.) s/p 4v CABG in 2014 No date: Diabetes mellitus without complication (HCC) No date: Diabetic neuropathy (HCC) No date: Diabetic peripheral neuropathy (HCC) 12/21/2017: Diabetic ulcer of toe of left foot associated with  diabetes mellitus of other type, limited to breakdown of skin (HCC) No date: Diverticulosis No date: Gout 05/11/2023: Gram-negative bacteremia No date: Heart murmur 05/31/2015: History of osteomyelitis No date: Hypercholesteremia No date: Hyperlipidemia No date:  Hypertension 08/17/2022: Hypotension due to hypovolemia 08/19/2022: Infection of left foot 02/28/2023: MSSA bacteremia 05/10/2023: Open wound of left foot with complication 05/31/2015: Osteomyelitis (HCC) No date: Peripheral neuropathy 02/26/2023: Postural dizziness with presyncope 06/09/2023: S/P BKA (below knee amputation) unilateral, left (HCC) 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 08/29/2022: S/P transmetatarsal amputation of foot, left (HCC) 08/17/2022: Sepsis (HCC) 05/10/2023: Sepsis (HCC) 11/01/2015: Status post amputation of toe of right foot No date: Tubular adenoma No date: Vitamin D  deficiency  Past Surgical History: 11/05/2023: ABDOMINAL AORTOGRAM W/LOWER EXTREMITY; N/A     Comment:  Procedure: ABDOMINAL AORTOGRAM W/LOWER EXTREMITY;                Surgeon: Marea Selinda RAMAN, MD;  Location: [facility]               [facility];  Service: Cardiovascular;  Laterality: N/A; 09/14/2023: ACHILLES TENDON SURGERY; Right     Comment:  Procedure: TENOTOMY, ACHILLES;  Surgeon: [name],              DPM;  Location: [facility] ORS;  Service: Orthopedics/Podiatry;              Laterality: Right; 08/19/2022: AMPUTATION; Left     Comment:  Procedure: TRANSMETATARSAL AMPUTATION LEFT FOOT WITH               IRRIGATION AND DEBRIDEMENT;  Surgeon: Lennie Barter, DPM;              Location: [facility];  Service: Podiatry;  Laterality:               Left; 05/16/2023: AMPUTATION; Left     Comment:  Procedure: AMPUTATION BELOW KNEE;  Surgeon: Marea Selinda  S, MD;  Location: [facility];  Service: General;                Laterality: Left; 09/14/2023: AMPUTATION; Right     Comment:  Procedure: AMPUTATION, FOOT, RAY;  Surgeon: [name], DPM;  Location: [facility];  Service:               Orthopedics/Podiatry;  Laterality: Right; 06/01/2015: AMPUTATION TOE; Right     Comment:  Procedure: AMPUTATION TOE;  Surgeon: Donnice Cory,               DPM;  Location: [facility];  Service: Podiatry;                 Laterality: Right; 08/11/2022: AMPUTATION TOE; Left     Comment:  Procedure: AMPUTATION TOE 2, 3, 4;  Surgeon: [name], DPM;  Location: [facility];  Service: Podiatry;                Laterality: Left; No date: CATARACT EXTRACTION, BILATERAL 06/12/2022: CIRCUMCISION; N/A     Comment:  Procedure: CIRCUMCISION ADULT;  Surgeon: Penne Knee, MD;  Location: [facility];  Service: Urology;                Laterality: N/A; 02/14/2016: COLONOSCOPY WITH PROPOFOL ; N/A     Comment:  Procedure: COLONOSCOPY WITH PROPOFOL ;  Surgeon: Gladis RAYMOND Mariner, MD;  Location: [facility] ENDOS[facility];  Service:               Endoscopy;  Laterality: N/A; 01/07/2019: COLONOSCOPY WITH PROPOFOL ; N/A     Comment:  Procedure: COLONOSCOPY WITH PROPOFOL ;  Surgeon:               Mariner Gladis RAYMOND, MD;  Location: [facility]OS[facility];                Service: Endoscopy;  Laterality: N/A; 08/2012: CORONARY ARTERY BYPASS GRAFT; N/A 06/01/2015: EXCISION PARTIAL PHALANX; Right     Comment:  Procedure: EXCISION PARTIAL PHALANX /  BONE;  Surgeon:               Donnice Cory, DPM;  Location: [facility];  Service:               Podiatry;  Laterality: Right; 05/29/2016: FLEXIBLE SIGMOIDOSCOPY; N/A     Comment:  Procedure: FLEXIBLE SIGMOIDOSCOPY;  Surgeon: Gladis RAYMOND Mariner, MD;  Location: [facility] ENDOS[facility];  Service:               Endoscopy;  Laterality: N/A; 08/23/2022: INCISION AND DRAINAGE; Left     Comment:  Procedure: INCISION AND DRAINAGE;  Surgeon: [name], DPM;  Location: [facility];  Service: Podiatry;                Laterality: Left; 09/15/2022: INCISION AND DRAINAGE OF WOUND; Left     Comment:  Procedure: 11044 - DEBRIDE BONE and EXCISION IF 1ST               METATARSAL BONE WITH  DELAY PRIMARY CLOSURE;  Surgeon:               [name], DPM;  Location: [facility];  Service:               Podiatry;  Laterality: Left; 02/28/2023: IRRIGATION AND  DEBRIDEMENT FOOT; Left     Comment:  Procedure: IRRIGATION AND DEBRIDEMENT FOOT;  Surgeon:               [name], DPM;  Location: [facility];  Service:               Orthopedics/Podiatry;  Laterality: Left; 01/11/2024: IRRIGATION AND DEBRIDEMENT FOOT; Right     Comment:  Procedure: IRRIGATION AND DEBRIDEMENT FOOT;  Surgeon:               [name], DPM;  Location: [facility];  Service:               Orthopedics/Podiatry;  Laterality: Right; No date: KNEE ARTHROSCOPY; Left 08/22/2022: LOWER EXTREMITY ANGIOGRAPHY; Left     Comment:  Procedure: Lower Extremity Angiography;  Surgeon:               Jama Cordella MATSU, MD;  Location: [facility];               Service: Cardiovascular;  Laterality: Left; 05/11/2023: LOWER EXTREMITY ANGIOGRAPHY; Left     Comment:  Procedure: Lower Extremity Angiography;  Surgeon: Marea Selinda RAMAN, MD;  Location: [facility];  Service:               Cardiovascular;  Laterality: Left; 09/11/2023: LOWER EXTREMITY ANGIOGRAPHY; Right     Comment:  Procedure: Lower Extremity Angiography;  Surgeon:               Jama Cordella MATSU, MD;  Location: [facility];               Service: Cardiovascular;  Laterality: Right; 01/15/2024: LOWER EXTREMITY ANGIOGRAPHY; Right     Comment:  Procedure: Lower Extremity Angiography;  Surgeon:               Jama Cordella MATSU, MD;  Location: [facility];               Service: Cardiovascular;  Laterality: Right; 03/11/2024: LOWER EXTREMITY ANGIOGRAPHY; Left     Comment:  Procedure: Lower Extremity Angiography;  Surgeon:               Jama Cordella MATSU, MD;  Location: [facility];               Service: Cardiovascular;  Laterality: Left; 09/11/2023: LOWER EXTREMITY INTERVENTION; Right     Comment:  Procedure: LOWER EXTREMITY INTERVENTION;  Surgeon:               Jama Cordella MATSU, MD;  Location: [facility];               Service: Cardiovascular;  Laterality:  Right; 03/01/2023: TEE WITHOUT CARDIOVERSION; N/A     Comment:  Procedure: TRANSESOPHAGEAL ECHOCARDIOGRAM;  Surgeon:               Wilburn Keller BROCKS, MD;  Location: [facility];  Service:               Cardiovascular;  Laterality: N/A; 11/02/2023: TRANSMETATARSAL AMPUTATION; Right     Comment:  Procedure: AMPUTATION,  FOOT, TRANSMETATARSAL;  Surgeon:               [name], DPM;  Location: [facility];  Service:               Orthopedics/Podiatry;  Laterality: Right; 12/21/2023: TRANSMETATARSAL AMPUTATION; Right     Comment:  Procedure: REVISION, AMPUTATION, FOOT, TRANSMETATARSAL;               Surgeon: [name], DPM;  Location: [facility];                Service: Orthopedics/Podiatry;  Laterality: Right; 09/15/2022: WOUND DEBRIDEMENT; Left     Comment:  Procedure: 11043 - DEBRIDE SKIN. MUSCLE FASCIA;                Surgeon: [name], DPM;  Location: [facility];                Service: Podiatry;  Laterality: Left;  BMI    Body Mass Index: 22.60 kg/m      Reproductive/Obstetrics negative OB ROS                              Anesthesia Physical Anesthesia Plan  ASA: 3  Anesthesia Plan: General   Post-op Pain Management:    Induction: Intravenous  PONV Risk Score and Plan: Ondansetron , Dexamethasone , Midazolam  and Treatment may vary due to age or medical condition  Airway Management Planned: Oral ETT  Additional Equipment:   Intra-op Plan:   Post-operative Plan: Extubation in OR  Informed Consent: I have reviewed the patients History and Physical, chart, labs and discussed the procedure including the risks, benefits and alternatives for the proposed anesthesia with the patient or authorized representative who has indicated his/her understanding and acceptance.     Dental Advisory Given  Plan Discussed with: CRNA  Anesthesia Plan Comments:          Anesthesia Quick Evaluation

## 2024-03-13 NOTE — Progress Notes (Signed)
 Progress Note    03/13/2024 11:57 AM 2 Days Post-Op  Subjective:  Andrew Harding is a 73 yo male now POD #2 from:  Date of Surgery: 03/11/2024   Surgeon:  Cordella Shawl   Pre-operative Diagnosis: Atherosclerotic occlusive disease bilateral lower extremities with ulceration and nonhealing wounds of the right lower extremity   Post-operative diagnosis:  Same   Procedure(s) Performed:             1.  Introduction catheter into right lower extremity 3rd order catheter placement with additional third order catheter placement             2.  Contrast injection right lower extremity for distal runoff with additional 3rd order             3.  Percutaneous transluminal angioplasty right anterior tibial artery                          4.  Star close closure left common femoral arteriotomy  Patient is recovering as expected.  No complications overnight.  Patient endorses leg feels much better.  Vitals are remained stable. Vitals:   03/13/24 0358 03/13/24 0730  BP: (!) 126/52 113/77  Pulse: 60 85  Resp: 16   Temp: 98.1 F (36.7 C) 97.9 F (36.6 C)  SpO2: 98% 100%   Physical Exam: Cardiac:  Irregular Hear Rate Hx of Atrial Fibrillation  Lungs: Nonlabored breathing, clear on auscultation throughout, no rales rhonchi or wheezing. Incisions: Left groin puncture site with dressing clean dry and intact.  No hematoma seroma noted. Extremities: Right lower extremity with Doppler pulses only.  Extremities warm to touch. Abdomen: Positive bowel sounds throughout, soft, nontender nondistended. Neurologic: Alert and oriented x 3, answers all questions follows commands appropriately.  CBC    Component Value Date/Time   WBC 7.6 03/13/2024 0520   RBC 4.27 03/13/2024 0520   HGB 11.6 (L) 03/13/2024 0520   HCT 36.6 (L) 03/13/2024 0520   PLT 273 03/13/2024 0520   MCV 85.7 03/13/2024 0520   MCH 27.2 03/13/2024 0520   MCHC 31.7 03/13/2024 0520   RDW 14.0 03/13/2024 0520   LYMPHSABS 1.2  01/10/2024 1218   MONOABS 0.4 01/10/2024 1218   EOSABS 0.3 01/10/2024 1218   BASOSABS 0.1 01/10/2024 1218    BMET    Component Value Date/Time   NA 137 01/13/2024 0457   K 3.6 01/13/2024 0457   CL 108 01/13/2024 0457   CO2 22 01/13/2024 0457   GLUCOSE 115 (H) 01/13/2024 0457   GLUCOSE 402 (H) 09/17/2012 1417   BUN 17 03/11/2024 1505   CREATININE 1.25 (H) 03/11/2024 1505   CALCIUM 8.4 (L) 01/13/2024 0457   GFRNONAA >60 03/11/2024 1505   GFRAA 58 (L) 06/02/2015 0656    INR    Component Value Date/Time   INR 1.2 01/12/2024 1433     Intake/Output Summary (Last 24 hours) at 03/13/2024 1157 Last data filed at 03/12/2024 1924 Gross per 24 hour  Intake 360 ml  Output --  Net 360 ml     Assessment/Plan:  73 y.o. male is s/p SEE ABOVE 2 Days Post-Op   PLAN The plan will be to undergo fifth metatarsal resection of the right foot tomorrow 03/13/2024 with debridement of all necrotic tissue, and possible wound VAC placement via Podiatry. Strong possibility of below-knee amputation is present if this debridement and limb salvage effort fails. Patient is aware.  Continue pain medication PRN.   DVT  prophylaxis:  Heparin  Infusion stopped for surgical procedure later today via Podiatry.    [name] [facility] 03/13/2024 11:57 AM

## 2024-03-13 NOTE — Progress Notes (Signed)
 RN spoke with MD and updated him on patient's condition. MD bedside to assess patient.

## 2024-03-13 NOTE — Progress Notes (Signed)
@   approximately 545pm patient called out and stated something is wrong after receiving IV cefepime .  Patient complained of itching all over and squirmming around in the bed, IV stopped.  Reached out to MD to make aware, patients heart rate from 150-160's, stating he felt like something was in his mouth. Rapid called.  O2 applied MD gave order to give 25 mg IV push, immediately relief expressed..  Continue to monitor vital signs, which are back at base.  Will update provider with any changes.

## 2024-03-13 NOTE — Transfer of Care (Signed)
 Immediate Anesthesia Transfer of Care Note  [name] Feeling  Procedure(s) Performed: IRRIGATION AND DEBRIDEMENT FOOT (Right: Foot) APPLICATION, WOUND VAC (Right: Foot)  Patient Location: PACU  Anesthesia Type:General  Level of Consciousness: drowsy and patient cooperative  Airway & Oxygen Therapy: Patient Spontanous Breathing and Patient connected to face mask oxygen  Post-op Assessment: Report given to RN and Post -op Vital signs reviewed and stable  Post vital signs: Reviewed and stable  Last Vitals:  Vitals Value Taken Time  BP 108/73 03/13/24 13:45  Temp    Pulse 88 03/13/24 13:47  Resp 13 03/13/24 13:47  SpO2 100 % 03/13/24 13:47  Vitals shown include unfiled device data.  Last Pain:  Vitals:   03/13/24 1208  TempSrc: Temporal  PainSc: 0-No pain         Complications: No notable events documented.

## 2024-03-13 NOTE — Consult Note (Addendum)
 Pharmacy Consult Note - Anticoagulation  Pharmacy Consult for Heparin  Indication: ACS No Known Allergies  PATIENT MEASUREMENTS: Height: 6' 3 (190.5 cm) Weight: 82 kg (180 lb 12.4 oz) IBW/kg (Calculated) : 84.5 HEPARIN  DW (KG): 82  VITAL SIGNS: Temp: 98.1 F (36.7 C) (11/13 0358) BP: 126/52 (11/13 0358) Pulse Rate: 60 (11/13 0358)  Recent Labs    03/11/24 1505 03/12/24 0553 03/13/24 0520  HGB  --    < > 11.6*  HCT  --    < > 36.6*  PLT  --    < > 273  APTT  --    < > 60*  HEPARINUNFRC  --   --  0.58  CREATININE 1.25*  --   --    < > = values in this interval not displayed.    Estimated Creatinine Clearance: 60.1 mL/min (A) (by C-G formula based on SCr of 1.25 mg/dL (H)).  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) 09/03/2023   Atrial fibrillation with RVR (HCC) 08/19/2022   Bladder neck obstruction    Cellulitis 08/17/2022   Chronic kidney disease    Coronary artery disease    a.) s/p 4v CABG in 2014   Diabetes mellitus without complication (HCC)    Diabetic neuropathy (HCC)    Diabetic peripheral neuropathy (HCC)    Diabetic ulcer of toe of left foot associated with diabetes mellitus of other type, limited to breakdown of skin (HCC) 12/21/2017   Diverticulosis    Gout    Gram-negative bacteremia 05/11/2023   Heart murmur    History of osteomyelitis 05/31/2015   Hypercholesteremia    Hyperlipidemia    Hypertension    Hypotension due to hypovolemia 08/17/2022   Infection of left foot 08/19/2022   MSSA bacteremia 02/28/2023   Open wound of left foot with complication 05/10/2023   Osteomyelitis (HCC) 05/31/2015   Peripheral neuropathy    Postural dizziness with presyncope 02/26/2023   S/P BKA (below knee amputation) unilateral, 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   S/P CABG  x 4 08/2012   S/P transmetatarsal amputation of foot, 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  Take 1 tablet (875 mg total) by mouth every 12 (twelve) hours for 7 days   03/09/2024   cyanocobalamin  (VITAMIN B12) 1000 MCG tablet Take 1 tablet (1,000 mcg total) by mouth daily.   03/09/2024   ELIQUIS  5 MG TABS tablet TAKE 1 TABLET(5 MG) BY MOUTH TWICE DAILY 60 tablet 2 03/10/2024   gabapentin  (NEURONTIN ) 300 MG capsule Take 1 capsule (300 mg total) by mouth 3 (three) times daily. 90 capsule 0 03/10/2024   insulin  glargine (LANTUS ) 100 UNIT/ML Solostar Pen Inject 28 Units into the skin at bedtime. (Patient taking differently: Inject 28 Units into the skin daily.)   03/09/2024   metFORMIN  (GLUCOPHAGE -XR) 500 MG 24 hr tablet Take 1,000 mg by mouth 2 (two) times daily with a meal.   03/10/2024   metoprolol  tartrate (LOPRESSOR ) 25 MG tablet Take 0.5  tablets (12.5 mg total) by mouth 2 (two) times daily.   03/10/2024   OZEMPIC, 1 MG/DOSE, 4 MG/3ML SOPN Inject 1 mg into the skin once a week.   Past Week   pravastatin  (PRAVACHOL ) 80 MG tablet Take 80 mg by mouth daily.   03/10/2024   traZODone  (DESYREL ) 50 MG tablet Take 50 mg by mouth at bedtime.   03/09/2024   acetaminophen  (TYLENOL ) 325 MG tablet Take 2 tablets (650 mg total) by mouth every 6 (six) hours as needed for mild pain (pain score 1-3) or fever (or Fever >/= 101). (Patient not taking: Reported on 03/07/2024) 30 tablet 0    aspirin  EC 81 MG tablet Take 1 tablet (81 mg total) by mouth daily. Swallow whole. (Patient not taking: Reported on 03/07/2024) 90 tablet 1    Continuous Glucose Sensor (FREESTYLE LIBRE 2 SENSOR) MISC        Dulaglutide  (TRULICITY ) 0.75 MG/0.5ML SOAJ Inject 0.75 mg into the skin once a week. Every Monday (Patient not taking: Reported on 03/12/2024)   Not Taking   glucose blood (ONETOUCH ULTRA) test strip Use 3 (three) times daily      Insulin  Pen Needle (FIFTY50 PEN NEEDLES) 32G X 4 MM MISC USE 4 TIMES DAILY (Patient not taking: Reported on 03/07/2024)      Scheduled:   gabapentin   300 mg Oral TID   insulin  aspart  0-15 Units Subcutaneous TID WC   insulin  aspart  0-5 Units Subcutaneous QHS   metoprolol  tartrate  12.5 mg Oral BID   rosuvastatin  20 mg Oral Daily   sodium chloride  flush  3 mL Intravenous Q12H   traZODone   50 mg Oral QHS   Infusions:   heparin  1,150 Units/hr (03/13/24 0517)    ASSESSMENT: 73 y.o. male with PMH PAD is presenting with non-healing wounds with ulceration on his right lower extremity and Atherosclerotic occlusive disease. Patient received a percutaneous transluminal angioplasty  on 11/11. Patient was previously on Eliquis  PTA per chart review. Patient has an upcoming procedure this week and will be transitioning to a heparin  gtt on 11/12 @ 7AM per MD Schnier. Pharmacy has been consulted to initiate and manage heparin  intravenous infusion.   Goal(s) of therapy: Heparin  level 0.3 - 0.7 units/mL aPTT 66 - 102 seconds Monitor platelets by anticoagulation protocol: Yes  Date Time aPTT Rate/Comment  11/12  1427 57 Subtherapeutic @ 1000 units/hr 11/12 2243 74 Therapeutic x 1 11/13 0520 60 Subtherapeutic / HL 0.58    PLAN: Bolus 1200 units x 1 Increase heparin  infusion to 1300 units/hour Heparin  scheduled to stop at 1100 prior to procedure Pharmacy to F/U anticoag plan post-op  Rankin CANDIE Dills, PharmD, [facility] 03/13/2024 6:44 AM

## 2024-03-13 NOTE — Progress Notes (Signed)
 Rapid Response Event Note   Reason for Call : allergic reaction   Initial Focused Assessment: On my arrival pt is very restless in the bed and saying that he itches and burns all over. He also initially stated that he felt like something was in his mouth. He had just received IV cefepime . Cefepime  was stopped. Pt's HR 165. Pt denies any shortness of breath.   Interventions: Dr. Jama notified and orders received for IV benadryl . Benadryl  given and pt's itching significantly decreased. Pt's HR also returned to normal. HR 80, BP 107/80, O2 sat 99%. Pt states that he only has mild itching now and denies shortness of breath. No redness noted to skin.   Plan of Care: Pt will remain on 2A at this time. No needs from primary RN.    Event Summary:   MD Notified: Dr. Jama Call Time: 1739 Arrival Time: 1741 End Time: 1815  Andrew LOISE Cools, RN

## 2024-03-13 NOTE — Op Note (Addendum)
 Operative note   Surgeon:Neeta Storey [employer]/support/administrative Officer: None    Preop diagnosis: Necrotic ulcer lateral right foot    Postop diagnosis: Same    Procedure: 1 excision fifth metatarsal right foot 2.  Application of non-disposable DME wound VAC right foot 3.  Intraoperative fluoroscopy without assistance of radiologist    EBL: Minimal    Anesthesia: General With local.  Local consists of a total of 20 cc of 0.5% plain bupivacaine     Hemostasis: None    Specimen: Deep wound culture and bone for pathology    Complications: None    Operative indications:Ilir H Tomko is an 73 y.o. that presents today for surgical intervention.  The risks/benefits/alternatives/complications have been discussed and consent has been given.    Procedure:  Patient was brought into the OR and placed on the operating table in thesupine position. After anesthesia was obtained theright lower extremity was prepped and draped in usual sterile fashion.  Attention was directed to the lateral aspect of the right foot where a large necrotic ulceration was noted at the fifth metatarsal base.  An ulcer on the very distal aspect of the fifth metatarsal was noted with drainage.  Incision was placed from the distal fifth metatarsal to the base of the fifth metatarsal.  The full-thickness necrotic flap tissue was excised at this time.  Full-thickness flaps were created both dorsal and plantar to the fifth metatarsal.  The entire residual fifth metatarsal base was then excised and removed from the surgical field in toto.  The bone was sent for pathological examination.  The wound was swabbed for aerobic and anaerobic testing.  Mild nonviable fibrotic tissue was then excised with a Versajet.  The wound was flushed with copious amounts of irrigation.  Bleeders were Bovie cauterized.  The area was infiltrated with Surgiflo with thrombin.  The distal portion of the skin flap was then closed with a 2-0 nylon.  A portion of the proximal flap  was left open and a wound VAC was then applied to the lateral aspect of the foot on the proximal aspect.  The residual wound opening was approximately 3-1/2 x 1 cm totalling 3.5 cm squared..  Intraoperative fluoroscopy was used throughout the entire procedure to note the fifth metatarsal and its excision.  Patient has medial and lateral pressure ulcers as well as a posterior heel decubitus ulcer.  This was covered with a silicone bordered foam pad dressing.    Patient tolerated the procedure and anesthesia well.  Was transported from the OR to the PACU with all vital signs stable and vascular status intact. To be discharged per routine protocol.  Will follow up in approximately 1 week in the outpatient clinic.

## 2024-03-13 NOTE — Anesthesia Postprocedure Evaluation (Signed)
 Anesthesia Post Note  [name]  Procedure(s) Performed: IRRIGATION AND DEBRIDEMENT FOOT (Right: Foot) APPLICATION, WOUND VAC (Right: Foot)  Patient location during evaluation: PACU Anesthesia Type: General Level of consciousness: awake Pain management: satisfactory to patient Vital Signs Assessment: vitals unstable Respiratory status: spontaneous breathing Cardiovascular status: stable Anesthetic complications: no   No notable events documented.   Last Vitals:  Vitals:   03/13/24 1208 03/13/24 1345  BP: (!) 140/75   Pulse: 75   Resp: 16   Temp: (!) 36.2 C (!) 36.2 C  SpO2: 98%     Last Pain:  Vitals:   03/13/24 1208  TempSrc: Temporal  PainSc: 0-No pain                 VAN STAVEREN,Boss Danielsen

## 2024-03-13 NOTE — Plan of Care (Signed)
   Problem: Education: Goal: Ability to describe self-care measures that may prevent or decrease complications (Diabetes Survival Skills Education) will improve Outcome: Progressing Goal: Individualized Educational Video(s) Outcome: Progressing   Problem: Coping: Goal: Ability to adjust to condition or change in health will improve Outcome: Progressing   Problem: Fluid Volume: Goal: Ability to maintain a balanced intake and output will improve Outcome: Progressing   Problem: Health Behavior/Discharge Planning: Goal: Ability to identify and utilize available resources and services will improve Outcome: Progressing Goal: Ability to manage health-related needs will improve Outcome: Progressing   Problem: Metabolic: Goal: Ability to maintain appropriate glucose levels will improve Outcome: Progressing   Problem: Nutritional: Goal: Maintenance of adequate nutrition will improve Outcome: Progressing Goal: Progress toward achieving an optimal weight will improve Outcome: Progressing   Problem: Skin Integrity: Goal: Risk for impaired skin integrity will decrease Outcome: Progressing   Problem: Tissue Perfusion: Goal: Adequacy of tissue perfusion will improve Outcome: Progressing   Problem: Education: Goal: Understanding of CV disease, CV risk reduction, and recovery process will improve Outcome: Progressing Goal: Individualized Educational Video(s) Outcome: Progressing   Problem: Activity: Goal: Ability to return to baseline activity level will improve Outcome: Progressing   Problem: Cardiovascular: Goal: Ability to achieve and maintain adequate cardiovascular perfusion will improve Outcome: Progressing Goal: Vascular access site(s) Level 0-1 will be maintained Outcome: Progressing   Problem: Health Behavior/Discharge Planning: Goal: Ability to safely manage health-related needs after discharge will improve Outcome: Progressing   Problem: Education: Goal: Knowledge  of General Education information will improve Description: Including pain rating scale, medication(s)/side effects and non-pharmacologic comfort measures Outcome: Progressing   Problem: Health Behavior/Discharge Planning: Goal: Ability to manage health-related needs will improve Outcome: Progressing   Problem: Clinical Measurements: Goal: Ability to maintain clinical measurements within normal limits will improve Outcome: Progressing Goal: Will remain free from infection Outcome: Progressing Goal: Diagnostic test results will improve Outcome: Progressing Goal: Respiratory complications will improve Outcome: Progressing Goal: Cardiovascular complication will be avoided Outcome: Progressing   Problem: Activity: Goal: Risk for activity intolerance will decrease Outcome: Progressing   Problem: Nutrition: Goal: Adequate nutrition will be maintained Outcome: Progressing   Problem: Coping: Goal: Level of anxiety will decrease Outcome: Progressing   Problem: Elimination: Goal: Will not experience complications related to bowel motility Outcome: Progressing Goal: Will not experience complications related to urinary retention Outcome: Progressing   Problem: Pain Managment: Goal: General experience of comfort will improve and/or be controlled Outcome: Progressing   Problem: Safety: Goal: Ability to remain free from injury will improve Outcome: Progressing   Problem: Skin Integrity: Goal: Risk for impaired skin integrity will decrease Outcome: Progressing

## 2024-03-14 ENCOUNTER — Encounter: Payer: Self-pay | Admitting: Podiatry

## 2024-03-14 DIAGNOSIS — Z9889 Other specified postprocedural states: Secondary | ICD-10-CM | POA: Diagnosis not present

## 2024-03-14 DIAGNOSIS — Z7982 Long term (current) use of aspirin: Secondary | ICD-10-CM | POA: Diagnosis not present

## 2024-03-14 DIAGNOSIS — R000 Tachycardia, unspecified: Secondary | ICD-10-CM

## 2024-03-14 DIAGNOSIS — Z89421 Acquired absence of other right toe(s): Secondary | ICD-10-CM

## 2024-03-14 DIAGNOSIS — T361X5A Adverse effect of cephalosporins and other beta-lactam antibiotics, initial encounter: Secondary | ICD-10-CM

## 2024-03-14 DIAGNOSIS — Z7901 Long term (current) use of anticoagulants: Secondary | ICD-10-CM

## 2024-03-14 DIAGNOSIS — I70261 Atherosclerosis of native arteries of extremities with gangrene, right leg: Secondary | ICD-10-CM | POA: Diagnosis not present

## 2024-03-14 DIAGNOSIS — R208 Other disturbances of skin sensation: Secondary | ICD-10-CM

## 2024-03-14 DIAGNOSIS — Z89411 Acquired absence of right great toe: Secondary | ICD-10-CM

## 2024-03-14 DIAGNOSIS — I70299 Other atherosclerosis of native arteries of extremities, unspecified extremity: Secondary | ICD-10-CM | POA: Diagnosis not present

## 2024-03-14 DIAGNOSIS — L97919 Non-pressure chronic ulcer of unspecified part of right lower leg with unspecified severity: Secondary | ICD-10-CM | POA: Diagnosis not present

## 2024-03-14 DIAGNOSIS — E11628 Type 2 diabetes mellitus with other skin complications: Secondary | ICD-10-CM | POA: Diagnosis not present

## 2024-03-14 DIAGNOSIS — I70239 Atherosclerosis of native arteries of right leg with ulceration of unspecified site: Secondary | ICD-10-CM | POA: Diagnosis not present

## 2024-03-14 DIAGNOSIS — L2989 Other pruritus: Secondary | ICD-10-CM

## 2024-03-14 DIAGNOSIS — L97909 Non-pressure chronic ulcer of unspecified part of unspecified lower leg with unspecified severity: Secondary | ICD-10-CM | POA: Diagnosis not present

## 2024-03-14 DIAGNOSIS — B9561 Methicillin susceptible Staphylococcus aureus infection as the cause of diseases classified elsewhere: Secondary | ICD-10-CM | POA: Diagnosis not present

## 2024-03-14 LAB — CBC
HCT: 31 % — ABNORMAL LOW (ref 39.0–52.0)
Hemoglobin: 10.3 g/dL — ABNORMAL LOW (ref 13.0–17.0)
MCH: 27.9 pg (ref 26.0–34.0)
MCHC: 33.2 g/dL (ref 30.0–36.0)
MCV: 84 fL (ref 80.0–100.0)
Platelets: 218 K/uL (ref 150–400)
RBC: 3.69 MIL/uL — ABNORMAL LOW (ref 4.22–5.81)
RDW: 13.6 % (ref 11.5–15.5)
WBC: 9.9 K/uL (ref 4.0–10.5)
nRBC: 0 % (ref 0.0–0.2)

## 2024-03-14 LAB — GLUCOSE, CAPILLARY
Glucose-Capillary: 243 mg/dL — ABNORMAL HIGH (ref 70–99)
Glucose-Capillary: 160 mg/dL — ABNORMAL HIGH (ref 70–99)
Glucose-Capillary: 168 mg/dL — ABNORMAL HIGH (ref 70–99)
Glucose-Capillary: 189 mg/dL — ABNORMAL HIGH (ref 70–99)

## 2024-03-14 MED ORDER — APIXABAN 5 MG PO TABS
5.0000 mg | ORAL_TABLET | Freq: Two times a day (BID) | ORAL | Status: DC
  Administered 2024-03-14 – 2024-03-20 (×13): 5 mg via ORAL
  Filled 2024-03-14 (×13): qty 1

## 2024-03-14 MED ORDER — HYDRALAZINE HCL 20 MG/ML IJ SOLN
5.0000 mg | INTRAMUSCULAR | Status: DC | PRN
Start: 2024-03-14 — End: 2024-03-21

## 2024-03-14 MED ORDER — ASPIRIN 81 MG PO TBEC
81.0000 mg | DELAYED_RELEASE_TABLET | Freq: Every day | ORAL | Status: DC
  Administered 2024-03-14 – 2024-03-20 (×7): 81 mg via ORAL
  Filled 2024-03-14 (×7): qty 1

## 2024-03-14 NOTE — Consult Note (Signed)
 NAME: Andrew Harding  DOB: 1950/12/23  MRN: [id number]  Date/Time: 03/14/2024 12:09 PM  REQUESTING PROVIDER: Dr. Ashley Subjective:  REASON FOR CONSULT: Right foot infection Patient well-known to me from multiple previous hospitalizations History from the patient Chart reviewed and detailed Andrew Harding is a 73 y.o. with a history of diabetes mellitus, peripheral neuropathy, peripheral artery disease, left BKA, CAD status post CABG, hypertension, A-fib on Eliquis , right TMA with revisions and with multiple courses of IV antibiotic is presenting from home with ongoing infection on the lateral margin of the TMA site along with an area of eschar.  Patient has had a intermittent infection of the right foot/TMA for the past few months. 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 The ESR and CRP are normalized Patient was being followed by foot specialist Dr. Ashley who noted that there was a new wound on the lateral margin of the right foot.  He debrided the wound in his clinic and took a deep culture and put him on Levaquin and Augmentin . he had him admitted for further vascular procedure and debridement.  Patient did not have any fever. Vitals done on 03/11/2024 showed a BP of 116/81, temperature 98.2, pulse 94, respiratory rate 17 Patient was seen by vascular and on 03/11/2024 underwent angio and that revealed the right common femoral to be widely patent.  The SFA and popliteal had straight diffuse disease but there were no hemodynamically significant lesions.  Previously placed stent in the distal popliteal was widely patent.  The trifurcation was disease with hemodynamically significant stenosis of the origin of the anterior tibial, peroneal and posterior tibial arteries.  The anterior tibial artery occluded shortly after its origin and then  reconstituted just proximal to the ankle and fills the dorsalis pedis and the pedal arch..  The posterior tibial did not reconstitute distally.  And the peroneal did not have any significant collaterals crossing the ankle distally.  So he underwent successful percutaneous transluminal angioplasty of the right anterior tibial artery. On 03/13/2024 he had excision of the fifth metatarsal and application of wound VAC.  Deep cultures were sent from the OR.  Perioperatively patient received cefazolin .  Postsurgically patient was given cefepime  around 5 PM yesterday and he developed burning and itching on his chest followed by a heart rate of 160.  He was given 25 mg of IV Benadryl  and symptoms resolved.  He did not have any shortness of breath or rash.  The heart rate was down to 70 when the hospitalist went to examine him.  He was placed on IV Unasyn .  I am asked to see the patient for antibiotic management. 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) 09/03/2023   Atrial fibrillation with RVR (HCC) 08/19/2022   Bladder neck obstruction    Cellulitis 08/17/2022   Chronic kidney disease    Coronary artery disease    a.) s/p 4v CABG in 2014   Diabetes mellitus without complication (HCC)    Diabetic neuropathy (HCC)    Diabetic peripheral neuropathy (HCC)    Diabetic ulcer of toe of left foot associated with  diabetes mellitus of other type, limited to breakdown of skin (HCC) 12/21/2017   Diverticulosis    Gout    Gram-negative bacteremia 05/11/2023   Heart murmur    History of osteomyelitis 05/31/2015   Hypercholesteremia    Hyperlipidemia    Hypertension    Hypotension due to hypovolemia 08/17/2022   Infection of left foot 08/19/2022   MSSA bacteremia 02/28/2023    Open wound of left foot with complication 05/10/2023   Osteomyelitis (HCC) 05/31/2015   Peripheral neuropathy    Postural dizziness with presyncope 02/26/2023   S/P BKA (below knee amputation) unilateral, 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   S/P transmetatarsal amputation of foot, left (HCC) 08/29/2022   Sepsis (HCC) 08/17/2022   Sepsis (HCC) 05/10/2023   Status post amputation of toe of right foot 11/01/2015   Tubular adenoma    Vitamin D  deficiency     Past Surgical History:  Procedure Laterality Date   ABDOMINAL AORTOGRAM W/LOWER EXTREMITY N/A 11/05/2023   Procedure: ABDOMINAL AORTOGRAM W/LOWER EXTREMITY;  Surgeon: Marea Selinda RAMAN, MD;  Location: [facility];  Service: Cardiovascular;  Laterality: N/A;   ACHILLES TENDON SURGERY Right 09/14/2023   Procedure: TENOTOMY, ACHILLES;  Surgeon: [name], DPM;  Location: [facility];  Service: Orthopedics/Podiatry;  Laterality: Right;   AMPUTATION Left 08/19/2022   Procedure: TRANSMETATARSAL AMPUTATION LEFT FOOT WITH IRRIGATION AND DEBRIDEMENT;  Surgeon: Lennie Barter, DPM;  Location: [facility];  Service: Podiatry;  Laterality: Left;   AMPUTATION Left 05/16/2023   Procedure: AMPUTATION BELOW KNEE;  Surgeon: Marea Selinda RAMAN, MD;  Location: [facility];  Service: General;  Laterality: Left;   AMPUTATION Right 09/14/2023   Procedure: AMPUTATION, FOOT, RAY;  Surgeon: [name], DPM;  Location: [facility];  Service: Orthopedics/Podiatry;  Laterality: Right;   AMPUTATION TOE Right 06/01/2015   Procedure: AMPUTATION TOE;  Surgeon: Donnice Cory, DPM;  Location: [facility];  Service: Podiatry;  Laterality: Right;   AMPUTATION TOE Left 08/11/2022   Procedure: AMPUTATION TOE 2, 3, 4;  Surgeon: [name], DPM;  Location: [facility];  Service: Podiatry;  Laterality: Left;   APPLICATION OF WOUND VAC Right 03/13/2024   Procedure: APPLICATION, WOUND VAC;  Surgeon: [name], DPM;  Location: [facility];  Service: Orthopedics/Podiatry;   Laterality: Right;   CATARACT EXTRACTION, BILATERAL     CIRCUMCISION N/A 06/12/2022   Procedure: CIRCUMCISION ADULT;  Surgeon: Penne Knee, MD;  Location: [facility];  Service: Urology;  Laterality: N/A;   COLONOSCOPY WITH PROPOFOL  N/A 02/14/2016   Procedure: COLONOSCOPY WITH PROPOFOL ;  Surgeon: Gladis RAYMOND Mariner, MD;  Location: [facility];  Service: Endoscopy;  Laterality: N/A;   COLONOSCOPY WITH PROPOFOL  N/A 01/07/2019   Procedure: COLONOSCOPY WITH PROPOFOL ;  Surgeon: Mariner Gladis RAYMOND, MD;  Location: [facility];  Service: Endoscopy;  Laterality: N/A;   CORONARY ARTERY BYPASS GRAFT N/A 08/2012   EXCISION PARTIAL PHALANX Right 06/01/2015   Procedure: EXCISION PARTIAL PHALANX /  BONE;  Surgeon: Donnice Cory, DPM;  Location: [facility];  Service: Podiatry;  Laterality: Right;   FLEXIBLE SIGMOIDOSCOPY N/A 05/29/2016   Procedure: FLEXIBLE SIGMOIDOSCOPY;  Surgeon: Gladis RAYMOND Mariner, MD;  Location: [facility] ENDOSCOPY;  Service: Endoscopy;  Laterality: N/A;   INCISION AND DRAINAGE Left 08/23/2022   Procedure: INCISION AND DRAINAGE;  Surgeon: [name], DPM;  Location: [facility];  Service: Podiatry;  Laterality: Left;   INCISION AND DRAINAGE OF WOUND Left 09/15/2022   Procedure: 11044 - DEBRIDE BONE and EXCISION IF 1ST METATARSAL BONE  WITH  DELAY PRIMARY CLOSURE;  Surgeon: [name], DPM;  Location: [facility];  Service: Podiatry;  Laterality: Left;   IRRIGATION AND DEBRIDEMENT FOOT Left 02/28/2023   Procedure: IRRIGATION AND DEBRIDEMENT FOOT;  Surgeon: [name], DPM;  Location: [facility];  Service: Orthopedics/Podiatry;  Laterality: Left;   IRRIGATION AND DEBRIDEMENT FOOT Right 01/11/2024   Procedure: IRRIGATION AND DEBRIDEMENT FOOT;  Surgeon: [name], DPM;  Location: [facility];  Service: Orthopedics/Podiatry;  Laterality: Right;   IRRIGATION AND DEBRIDEMENT FOOT Right 03/13/2024   Procedure: IRRIGATION AND DEBRIDEMENT FOOT;  Surgeon: [name], DPM;  Location: [facility];   Service: Orthopedics/Podiatry;  Laterality: Right;   KNEE ARTHROSCOPY Left    LOWER EXTREMITY ANGIOGRAPHY Left 08/22/2022   Procedure: Lower Extremity Angiography;  Surgeon: Jama Cordella MATSU, MD;  Location: [facility];  Service: Cardiovascular;  Laterality: Left;   LOWER EXTREMITY ANGIOGRAPHY Left 05/11/2023   Procedure: Lower Extremity Angiography;  Surgeon: Marea Selinda RAMAN, MD;  Location: [facility];  Service: Cardiovascular;  Laterality: Left;   LOWER EXTREMITY ANGIOGRAPHY Right 09/11/2023   Procedure: Lower Extremity Angiography;  Surgeon: Jama Cordella MATSU, MD;  Location: [facility];  Service: Cardiovascular;  Laterality: Right;   LOWER EXTREMITY ANGIOGRAPHY Right 01/15/2024   Procedure: Lower Extremity Angiography;  Surgeon: Jama Cordella MATSU, MD;  Location: [facility];  Service: Cardiovascular;  Laterality: Right;   LOWER EXTREMITY ANGIOGRAPHY Left 03/11/2024   Procedure: Lower Extremity Angiography;  Surgeon: Jama Cordella MATSU, MD;  Location: [facility];  Service: Cardiovascular;  Laterality: Left;   LOWER EXTREMITY INTERVENTION Right 09/11/2023   Procedure: LOWER EXTREMITY INTERVENTION;  Surgeon: Jama Cordella MATSU, MD;  Location: [facility];  Service: Cardiovascular;  Laterality: Right;   TEE WITHOUT CARDIOVERSION N/A 03/01/2023   Procedure: TRANSESOPHAGEAL ECHOCARDIOGRAM;  Surgeon: Alluri, Keller BROCKS, MD;  Location: [facility];  Service: Cardiovascular;  Laterality: N/A;   TRANSMETATARSAL AMPUTATION Right 11/02/2023   Procedure: AMPUTATION, FOOT, TRANSMETATARSAL;  Surgeon: [name], DPM;  Location: [facility];  Service: Orthopedics/Podiatry;  Laterality: Right;   TRANSMETATARSAL AMPUTATION Right 12/21/2023   Procedure: REVISION, AMPUTATION, FOOT, TRANSMETATARSAL;  Surgeon: [name], DPM;  Location: [facility];  Service: Orthopedics/Podiatry;  Laterality: Right;   WOUND DEBRIDEMENT Left 09/15/2022   Procedure: 11043 - DEBRIDE  SKIN. MUSCLE FASCIA;  Surgeon: [name], DPM;  Location: [facility];  Service: Podiatry;  Laterality: Left;    Social History   Socioeconomic History   Marital status: Divorced    Spouse name: Ashley,Angela C   Number of children: Not on file   Years of education: Not on file   Highest education level: Not on file  Occupational History   Not on file  Tobacco Use   Smoking status: Former    Types: Cigars   Smokeless tobacco: Never  Vaping Use   Vaping status: Never Used  Substance and Sexual Activity   Alcohol use: No   Drug use: No   Sexual activity: Never  Other Topics Concern   Not on file  Social History Narrative   Patient currently resides at    [facility]   [address]  [place], [place]  [phone]      Patient is legally separated and lives at home by himself. His ex -wife is his HCPOA. Neighbors and friends are his support system. He used to work for [employer]    Social Drivers of Corporate Investment Banker  Strain: Low Risk  (02/22/2024)   Received from [facility]   Overall Financial Resource Strain (CARDIA)    Difficulty of Paying Living Expenses: Not hard at all  Food Insecurity: No Food Insecurity (03/12/2024)   Hunger Vital Sign    Worried About Running Out of Food in the Last Year: Never true    Ran Out of Food in the Last Year: Never true  Transportation Needs: No Transportation Needs (03/12/2024)   PRAPARE - Administrator, Civil Service (Medical): No    Lack of Transportation (Non-Medical): No  Physical Activity: Not on file  Stress: Not on file  Social Connections: Unknown (03/12/2024)   Social Connection and Isolation Panel    Frequency of Communication with Friends and Family: Three times a week    Frequency of Social Gatherings with Friends and Family: Twice a week    Attends Religious Services: 1 to 4 times per year    Active Member of [employer] or Organizations: Yes    Attends Occupational Hygienist Meetings: 1 to 4 times per year    Marital Status: Patient declined  Recent Concern: Social Connections - Moderately Isolated (01/10/2024)   Social Connection and Isolation Panel    Frequency of Communication with Friends and Family: More than three times a week    Frequency of Social Gatherings with Friends and Family: Twice a week    Attends Religious Services: More than 4 times per year    Active Member of [employer] or Organizations: No    Attends Banker Meetings: Never    Marital Status: Divorced  Catering Manager Violence: Not At Risk (03/12/2024)   Humiliation, Afraid, Rape, and Kick questionnaire    Fear of Current or Ex-Partner: No    Emotionally Abused: No    Physically Abused: No    Sexually Abused: No    Family History  Problem Relation Age of Onset   Diabetes Mellitus II Mother    CAD Mother    Allergies  Allergen Reactions   Cefepime  Itching   I? Current Facility-Administered Medications  Medication Dose Route Frequency Provider Last Rate Last Admin   acetaminophen  (TYLENOL ) tablet 650 mg  650 mg Oral Q4H PRN Schnier, Gregory G, MD       Ampicillin -Sulbactam (UNASYN ) 3 g in sodium chloride  0.9 % 100 mL IVPB  3 g Intravenous Q8H Schnier, Cordella MATSU, MD 200 mL/hr at 03/14/24 0533 3 g at 03/14/24 0533   apixaban  (ELIQUIS ) tablet 5 mg  5 mg Oral BID Pace, Brien R, NP   5 mg at 03/14/24 9057   aspirin  EC tablet 81 mg  81 mg Oral Daily Pace, Brien R, NP   81 mg at 03/14/24 0941   diphenhydrAMINE  (BENADRYL ) 25 mg in sodium chloride  0.9 % 50 mL IVPB  25 mg Intravenous STAT Schnier, Cordella MATSU, MD       diphenhydrAMINE  (BENADRYL ) injection 25 mg  25 mg Intravenous Q8H PRN Niu, Xilin, MD       gabapentin  (NEURONTIN ) capsule 300 mg  300 mg Oral TID Schnier, Gregory G, MD   300 mg at 03/14/24 0941   hydrALAZINE  (APRESOLINE ) injection 5 mg  5 mg Intravenous Q2H PRN Niu, Xilin, MD       insulin  aspart (novoLOG ) injection 0-15 Units  0-15 Units Subcutaneous  TID WC Schnier, Gregory G, MD   5 Units at 03/14/24 9057   insulin  aspart (novoLOG ) injection 0-5 Units  0-5 Units Subcutaneous QHS Schnier,  Cordella MATSU, MD   4 Units at 03/13/24 2152   insulin  glargine-yfgn (SEMGLEE ) injection 14 Units  14 Units Subcutaneous Q2200 Niu, Xilin, MD   14 Units at 03/13/24 2153   metoprolol  tartrate (LOPRESSOR ) injection 2.5 mg  2.5 mg Intravenous Q2H PRN Niu, Xilin, MD       metoprolol  tartrate (LOPRESSOR ) tablet 12.5 mg  12.5 mg Oral BID Dail Rankin RAMAN, RPH   12.5 mg at 03/14/24 9057   morphine  (PF) 2 MG/ML injection 2 mg  2 mg Intravenous Q1H PRN Schnier, Cordella MATSU, MD       ondansetron  (ZOFRAN ) injection 4 mg  4 mg Intravenous Q6H PRN Brown, Fallon E, NP       oxyCODONE  (Oxy IR/ROXICODONE ) immediate release tablet 5-10 mg  5-10 mg Oral Q4H PRN Schnier, Cordella MATSU, MD       rosuvastatin (CRESTOR) tablet 20 mg  20 mg Oral Daily Schnier, Cordella MATSU, MD   20 mg at 03/14/24 0941   sodium chloride  flush (NS) 0.9 % injection 3 mL  3 mL Intravenous Q12H Schnier, Cordella MATSU, MD   3 mL at 03/14/24 9048   sodium chloride  flush (NS) 0.9 % injection 3 mL  3 mL Intravenous PRN Schnier, Cordella MATSU, MD       traZODone  (DESYREL ) tablet 50 mg  50 mg Oral QHS Schnier, Cordella MATSU, MD   50 mg at 03/13/24 2152     Abtx:  Anti-infectives (From admission, onward)    Start     Dose/Rate Route Frequency Ordered Stop   03/14/24 0600  ceFAZolin  (ANCEF ) IVPB 2g/100 mL premix        2 g 200 mL/hr over 30 Minutes Intravenous On call to O.R. 03/13/24 1203 03/13/24 1314   03/13/24 2200  Ampicillin -Sulbactam (UNASYN ) 3 g in sodium chloride  0.9 % 100 mL IVPB        3 g 200 mL/hr over 30 Minutes Intravenous Every 8 hours 03/13/24 1850     03/13/24 1500  ceFEPIme  (MAXIPIME ) 2 g in sodium chloride  0.9 % 100 mL IVPB  Status:  Discontinued        2 g 200 mL/hr over 30 Minutes Intravenous Every 8 hours 03/13/24 1401 03/13/24 1845   03/11/24 1452  ceFAZolin  (ANCEF ) IVPB 2g/100 mL premix        2 g 200  mL/hr over 30 Minutes Intravenous 30 min pre-op 03/11/24 1452 03/11/24 1632       REVIEW OF SYSTEMS:  Const: negative fever, negative chills, negative weight loss Eyes: negative diplopia or visual changes, negative eye pain ENT: negative coryza, negative sore throat Resp: negative cough, hemoptysis, dyspnea Cards: negative for chest pain, palpitations, lower extremity edema GU: negative for frequency, dysuria and hematuria GI: Negative for abdominal pain, diarrhea, bleeding, constipation Skin: negative for rash and pruritus Heme: negative for easy bruising and gum/nose bleeding MS: negative for myalgias, arthralgias, back pain and muscle weakness Neurolo:negative for headaches, dizziness, vertigo, memory problems  Psych: negative for feelings of anxiety, depression  Endocrine:  diabetes Allergy/Immunology-cefepime  Objective:  VITALS:  BP 108/72 (BP Location: Left Arm)   Pulse 71   Temp 98.3 F (36.8 C) (Oral)   Resp 19   Ht 6' 3 (1.905 m)   Wt 82 kg   SpO2 99%   BMI 22.60 kg/m   PHYSICAL EXAM:  General: Alert, cooperative, no distress, appears stated age.  Head: Normocephalic, without obvious abnormality, atraumatic. Eyes: Conjunctivae clear, anicteric sclerae. Pupils are equal ENT Nares normal.  No drainage or sinus tenderness. Lips, mucosa, and tongue normal. No Thrush Neck: Supple, symmetrical, no adenopathy, thyroid: non tender no carotid bruit and no JVD. Back: No CVA tenderness. Lungs: Clear to auscultation bilaterally. No Wheezing or Rhonchi. No rales. Heart: Regular rate and rhythm, no murmur, rub or gallop. Abdomen: Soft, non-tender,not distended. Bowel sounds normal. No masses Extremities: Right foot wound VAC not removed Left BKA site healthy  Right foot pictures reviewed Right Achilles area ulcer  Right TMA medial aspect has collagen  Right TMA lateral margin has an eschar  Skin: No rashes or lesions. Or bruising Lymph: Cervical, supraclavicular  normal. Neurologic: Grossly non-focal Pertinent Labs Lab Results CBC    Component Value Date/Time   WBC 9.9 03/14/2024 0926   RBC 3.69 (L) 03/14/2024 0926   HGB 10.3 (L) 03/14/2024 0926   HCT 31.0 (L) 03/14/2024 0926   PLT 218 03/14/2024 0926   MCV 84.0 03/14/2024 0926   MCH 27.9 03/14/2024 0926   MCHC 33.2 03/14/2024 0926   RDW 13.6 03/14/2024 0926   LYMPHSABS 1.2 01/10/2024 1218   MONOABS 0.4 01/10/2024 1218   EOSABS 0.3 01/10/2024 1218   BASOSABS 0.1 01/10/2024 1218       Latest Ref Rng & Units 03/13/2024    8:22 PM 03/11/2024    3:05 PM 01/13/2024    4:57 AM  CMP  Glucose 70 - 99 mg/dL 647   884   BUN 8 - 23 mg/dL 16  17  14    Creatinine 0.61 - 1.24 mg/dL 8.62  8.74  8.73   Sodium 135 - 145 mmol/L 138   137   Potassium 3.5 - 5.1 mmol/L 4.4   3.6   Chloride 98 - 111 mmol/L 104   108   CO2 22 - 32 mmol/L 22   22   Calcium 8.9 - 10.3 mg/dL 8.8   8.4       Microbiology: Recent Results (from the past 240 hours)  Aerobic/Anaerobic Culture w Gram Stain (surgical/deep wound)     Status: None (Preliminary result)   Collection Time: 03/13/24  1:00 PM   Specimen: Path fluid; Body Fluid  Result Value Ref Range Status   Specimen Description ABSCESS  Final   Special Requests RIGHT FIFTH METATARSAL  Final   Gram Stain NO WBC SEEN NO ORGANISMS SEEN   Final   Culture   Final    NO GROWTH < 12 HOURS Performed at [facility], [address] [place], [place] [address]    Report Status PENDING  Incomplete      IMAGING RESULTS: No imaging this admission. ? Impression/Recommendation 73 year old male with history of diabetes, peripheral neuropathy, PAD, left BKA, CAD status post CABG, hypertension, A-fib on Eliquis , presents with an ongoing ischemic wound on the lateral margin of the right TMA Since May 25 patient has been having right foot infection. Initially he had right great toe amputation followed by TMA on 11/02/2023.  He was treated with 6 weeks of IV  daptomycin  for MRSA and Enterococcus faecalis osteomyelitis. The wound healing had been complicated by superficial necrosis at the surgical site and patient has had multiple vascular procedures of the right lower extremity On 12/21/2023 he had further debridement of the right TMA and the organisms that time was Enterobacter,pseudeschericia, and granulicutella. He received levaquin and augmentin  9/11-9/17 admitted and further debridement of the TMA.  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  In the culture was Enterobacter and Prevotella.  He was discharged  on ertapenem  IV and completed 6 weeks on 02/21/2024 and PICC line was removed.  He was off antibiotics until 03/06/2024 when he was restarted on Levaquin and Augmentin  after a culture was taken by Dr. Ashley in his office  He is now admitted for further debridement of an eschar on the lateral margin as well as for vascular procedure   New ischemic eschar on the lateral margin of the right foot Recurrent infection on the lateral end of the surgical site Patient underwent irrigation and debridement and also excision of the fifth metatarsal.  Application of wound VAC Patient is currently on Unasyn  as the culture on 03/06/2024 grew Staph aureus Continue the same until the surgical culture is available  PAD Status post balloon angioplasty of the right anterior tibial artery  Cefepime  causing tachycardia and burning and itching  reaction.  Discontinued and Benadryl  given  Diabetes mellitus On insulin   A-fib on  CAD status post CABG on Crestor metoprolol    This consult involve complex antimicrobial management  I have personally spent  -60--minutes involved in face-to-face and non-face-to-face activities for this patient on the day of the visit. Professional time spent includes the following activities: Preparing to see the patient (review of tests), Obtaining and/or reviewing separately obtained history (admission/discharge record),  Performing a medically appropriate examination and/or evaluation , Ordering medications/tests/procedures, referring and communicating with other health care professionals, Documenting clinical information in the EMR, Independently interpreting results (not separately reported), Communicating results to the patient/, Counseling and educating the patient/and Care coordination (not separately reported).    ________________________________________________ ID will not routinely follow him this weekend Call if needed Note:  This document was prepared using Dragon voice recognition software and may include unintentional dictation errors.

## 2024-03-14 NOTE — Progress Notes (Signed)
 Daily Progress Note   Subjective  - 1 Day Post-Op  Follow-up right foot debridement with excision of fifth metatarsal and wound VAC placement.  Patient resting comfortably in bed at this time.  No complaints currently.  Patient did develop reaction after receiving cefepime .  Tachycardiac with itching and anxiousness.  I have requested infectious disease consult for assistance with antibiotics going forward.  Objective Vitals:   03/14/24 0024 03/14/24 0026 03/14/24 0434 03/14/24 0800  BP: 113/79 113/79 103/66 108/72  Pulse: 81 83 79 71  Resp: 20 20 20 19   Temp: 98.5 F (36.9 C) 98.5 F (36.9 C) 98.4 F (36.9 C) 98.3 F (36.8 C)  TempSrc:    Oral  SpO2: 99% 99% 99% 99%  Weight:      Height:        Physical Exam: Wound VAC in place and good seal is noted.  Only mild drainage within the canister.  No severe surrounding erythema to the right foot.  Laboratory CBC    Component Value Date/Time   WBC 9.9 03/14/2024 0926   HGB 10.3 (L) 03/14/2024 0926   HCT 31.0 (L) 03/14/2024 0926   PLT 218 03/14/2024 0926    BMET    Component Value Date/Time   NA 138 03/13/2024 2022   K 4.4 03/13/2024 2022   CL 104 03/13/2024 2022   CO2 22 03/13/2024 2022   GLUCOSE 352 (H) 03/13/2024 2022   GLUCOSE 402 (H) 09/17/2012 1417   BUN 16 03/13/2024 2022   CREATININE 1.37 (H) 03/13/2024 2022   CALCIUM 8.8 (L) 03/13/2024 2022   GFRNONAA 54 (L) 03/13/2024 2022   GFRAA 58 (L) 06/02/2015 0656    Assessment/Planning: Osteomyelitis with necrotic right lateral foot wound status post excision fifth metatarsal and debridement of ulceration with wound VAC placement  Will change wound VAC tomorrow and reevaluate foot.  Look for signs and symptoms of infection.  I suspect the amputation was therapeutic with no residual osteomyelitis.  The outpatient wound culture showed a penicillin resistant oxacillin sensitive Staph aureus.  Infectious disease is familiar with this patient and have consulted  them. Reinforce strict nonweightbearing to this right foot.  He will need wound VAC upon discharge.  Physical therapy has been consulted. Patient awaiting placement at this time.  [name] A  03/14/2024, 11:49 AM

## 2024-03-14 NOTE — Progress Notes (Addendum)
 PROGRESS NOTE    Andrew Harding  FMW:[id number] DOB: [birth year] DOA: 03/11/2024 PCP: Fernande Ophelia [name], MD   Assessment & Plan:   Principal Problem:   Atherosclerosis of artery of extremity with ulceration (HCC) Active Problems:   Diabetic foot infection (HCC)   Chronic atrial fibrillation with RVR (HCC)   Allergic reaction   Benign essential hypertension   HLD (hyperlipidemia)   CAD (coronary artery disease)   Type 2 diabetes mellitus with stage 3b chronic kidney disease, with long-term current use of insulin  (HCC)  Assessment and Plan:  Atherosclerosis of artery of extremity with ulceration and diabetic foot infection-right foot: s/p excision fifth metatarsal right foot, with application of wound VAC right foot by podiatry. Continue on IV unasyn . ID consulted. Nonweightbearing of right foot and will d/c w/ wound vac as per podiatry   Chronic a. fib: w/ RVR. Continue on home dose of metoprolol , eliquis . IV metoprolol  prn.   Allergic reaction: likely to cefepime . W/ itching all over & tachycardia which have resolved. Benadryl  prn    HTN: continue on metoprolol     HLD: continue on statin   Hx of CAD: continue on metoprolol , statin    DM2: well controlled, HbA1c 5.7. Continue on glargine, SSI w/ accuchecks   CKDIIIb: Cr is trending up from day prior. Avoid nephrotoxic meds      DVT prophylaxis: eliquis  Code Status: full  Family Communication:  Disposition Plan: as per primary team   Level of care: Progressive Consultants:  Hospitalist  ID Podiatry   Procedures:   Antimicrobials: unasyn     Subjective: Pt c/o fatigue   Objective: Vitals:   03/14/24 0024 03/14/24 0026 03/14/24 0434 03/14/24 0800  BP: 113/79 113/79 103/66 108/72  Pulse: 81 83 79 71  Resp: 20 20 20 19   Temp: 98.5 F (36.9 C) 98.5 F (36.9 C) 98.4 F (36.9 C) 98.3 F (36.8 C)  TempSrc:    Oral  SpO2: 99% 99% 99% 99%  Weight:      Height:        Intake/Output Summary (Last 24 hours) at  03/14/2024 0907 Last data filed at 03/14/2024 0715 Gross per 24 hour  Intake 1304.11 ml  Output 550 ml  Net 754.11 ml   Filed Weights   03/11/24 1508 03/13/24 0531 03/13/24 1208  Weight: 82 kg 82 kg 82 kg    Examination:  General exam: Appears calm and comfortable  Respiratory system: Clear to auscultation. Respiratory effort normal. Cardiovascular system: S1 & S2 +. No  rubs, gallops or clicks. Gastrointestinal system: Abdomen is nondistended, soft and nontender. Normal bowel sounds heard. Central nervous system: Alert and oriented. Moves all extremities  Psychiatry: Judgement and insight appear normal. Mood & affect appropriate.     Data Reviewed: I have personally reviewed following labs and imaging studies  CBC: Recent Labs  Lab 03/12/24 0553 03/13/24 0520  WBC 8.7 7.6  HGB 11.2* 11.6*  HCT 34.3* 36.6*  MCV 83.5 85.7  PLT 271 273   Basic Metabolic Panel: Recent Labs  Lab 03/11/24 1505 03/13/24 2022  NA  --  138  K  --  4.4  CL  --  104  CO2  --  22  GLUCOSE  --  352*  BUN 17 16  CREATININE 1.25* 1.37*  CALCIUM  --  8.8*   GFR: Estimated Creatinine Clearance: 54.9 mL/min (A) (by C-G formula based on SCr of 1.37 mg/dL (H)). Liver Function Tests: No results for input(s): AST, ALT, ALKPHOS, BILITOT,  PROT, ALBUMIN in the last 168 hours. No results for input(s): LIPASE, AMYLASE in the last 168 hours. No results for input(s): AMMONIA in the last 168 hours. Coagulation Profile: No results for input(s): INR, PROTIME in the last 168 hours. Cardiac Enzymes: No results for input(s): CKTOTAL, CKMB, CKMBINDEX, TROPONINI in the last 168 hours. BNP (last 3 results) No results for input(s): PROBNP in the last 8760 hours. HbA1C: No results for input(s): HGBA1C in the last 72 hours. CBG: Recent Labs  Lab 03/13/24 1347 03/13/24 1506 03/13/24 1700 03/13/24 2058 03/14/24 0812  GLUCAP 116* 126* 295* 309* 243*   Lipid  Profile: No results for input(s): CHOL, HDL, LDLCALC, TRIG, CHOLHDL, LDLDIRECT in the last 72 hours. Thyroid Function Tests: No results for input(s): TSH, T4TOTAL, FREET4, T3FREE, THYROIDAB in the last 72 hours. Anemia Panel: No results for input(s): VITAMINB12, FOLATE, FERRITIN, TIBC, IRON , RETICCTPCT in the last 72 hours. Sepsis Labs: No results for input(s): PROCALCITON, LATICACIDVEN in the last 168 hours.  Recent Results (from the past 240 hours)  Aerobic/Anaerobic Culture w Gram Stain (surgical/deep wound)     Status: None (Preliminary result)   Collection Time: 03/13/24  1:00 PM   Specimen: Path fluid; Body Fluid  Result Value Ref Range Status   Specimen Description ABSCESS  Final   Special Requests RIGHT FIFTH METATARSAL  Final   Gram Stain NO WBC SEEN NO ORGANISMS SEEN   Final   Culture   Final    NO GROWTH < 12 HOURS Performed at [facility], [address], [place], [place] [address]    Report Status PENDING  Incomplete         Radiology Studies: DG MINI C-ARM IMAGE ONLY Result Date: 03/13/2024 There is no interpretation for this exam.  This order is for images obtained during a surgical procedure.  Please See Surgeries Tab for more information regarding the procedure.        Scheduled Meds:  apixaban   5 mg Oral BID   aspirin  EC  81 mg Oral Daily   gabapentin   300 mg Oral TID   insulin  aspart  0-15 Units Subcutaneous TID WC   insulin  aspart  0-5 Units Subcutaneous QHS   insulin  glargine-yfgn  14 Units Subcutaneous Q2200   metoprolol  tartrate  12.5 mg Oral BID   rosuvastatin  20 mg Oral Daily   sodium chloride  flush  3 mL Intravenous Q12H   traZODone   50 mg Oral QHS   Continuous Infusions:  ampicillin -sulbactam (UNASYN ) IV 3 g (03/14/24 0533)   diphenhydrAMINE        LOS: 2 days      Anthony CHRISTELLA Pouch, MD [facility] Pager [phone]  If 7PM-7AM, please contact  night-coverage [email or web address] 03/14/2024, 9:07 AM

## 2024-03-14 NOTE — Plan of Care (Signed)
   Problem: Education: Goal: Ability to describe self-care measures that may prevent or decrease complications (Diabetes Survival Skills Education) will improve Outcome: Progressing Goal: Individualized Educational Video(s) Outcome: Progressing   Problem: Coping: Goal: Ability to adjust to condition or change in health will improve Outcome: Progressing   Problem: Fluid Volume: Goal: Ability to maintain a balanced intake and output will improve Outcome: Progressing   Problem: Health Behavior/Discharge Planning: Goal: Ability to identify and utilize available resources and services will improve Outcome: Progressing Goal: Ability to manage health-related needs will improve Outcome: Progressing   Problem: Metabolic: Goal: Ability to maintain appropriate glucose levels will improve Outcome: Progressing   Problem: Nutritional: Goal: Maintenance of adequate nutrition will improve Outcome: Progressing Goal: Progress toward achieving an optimal weight will improve Outcome: Progressing   Problem: Skin Integrity: Goal: Risk for impaired skin integrity will decrease Outcome: Progressing   Problem: Tissue Perfusion: Goal: Adequacy of tissue perfusion will improve Outcome: Progressing   Problem: Education: Goal: Understanding of CV disease, CV risk reduction, and recovery process will improve Outcome: Progressing Goal: Individualized Educational Video(s) Outcome: Progressing   Problem: Activity: Goal: Ability to return to baseline activity level will improve Outcome: Progressing   Problem: Cardiovascular: Goal: Ability to achieve and maintain adequate cardiovascular perfusion will improve Outcome: Progressing Goal: Vascular access site(s) Level 0-1 will be maintained Outcome: Progressing   Problem: Health Behavior/Discharge Planning: Goal: Ability to safely manage health-related needs after discharge will improve Outcome: Progressing   Problem: Education: Goal: Knowledge  of General Education information will improve Description: Including pain rating scale, medication(s)/side effects and non-pharmacologic comfort measures Outcome: Progressing   Problem: Health Behavior/Discharge Planning: Goal: Ability to manage health-related needs will improve Outcome: Progressing   Problem: Clinical Measurements: Goal: Ability to maintain clinical measurements within normal limits will improve Outcome: Progressing Goal: Will remain free from infection Outcome: Progressing Goal: Diagnostic test results will improve Outcome: Progressing Goal: Respiratory complications will improve Outcome: Progressing Goal: Cardiovascular complication will be avoided Outcome: Progressing   Problem: Activity: Goal: Risk for activity intolerance will decrease Outcome: Progressing   Problem: Nutrition: Goal: Adequate nutrition will be maintained Outcome: Progressing   Problem: Coping: Goal: Level of anxiety will decrease Outcome: Progressing   Problem: Elimination: Goal: Will not experience complications related to bowel motility Outcome: Progressing Goal: Will not experience complications related to urinary retention Outcome: Progressing   Problem: Pain Managment: Goal: General experience of comfort will improve and/or be controlled Outcome: Progressing   Problem: Safety: Goal: Ability to remain free from injury will improve Outcome: Progressing   Problem: Skin Integrity: Goal: Risk for impaired skin integrity will decrease Outcome: Progressing

## 2024-03-14 NOTE — Consult Note (Signed)
 Pharmacy Consult Note - Anticoagulation  Pharmacy Consult for Heparin  Indication: ACS Allergies  Allergen Reactions   Cefepime  Itching    PATIENT MEASUREMENTS: Height: 6' 3 (190.5 cm) Weight: 82 kg (180 lb 12.4 oz) IBW/kg (Calculated) : 84.5 HEPARIN  DW (KG): 82  VITAL SIGNS: Temp: 98.2 F (36.8 C) (11/13 2033) BP: 109/76 (11/13 2033) Pulse Rate: 72 (11/13 2033)  Recent Labs    03/13/24 0520 03/13/24 2022 03/13/24 2316  HGB 11.6*  --   --   HCT 36.6*  --   --   PLT 273  --   --   APTT 60*  --   --   HEPARINUNFRC 0.58  --  0.50  CREATININE  --  1.37*  --     Estimated Creatinine Clearance: 54.9 mL/min (A) (by C-G formula based on SCr of 1.37 mg/dL (H)).  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) 09/03/2023   Atrial fibrillation with RVR (HCC) 08/19/2022   Bladder neck obstruction    Cellulitis 08/17/2022   Chronic kidney disease    Coronary artery disease    a.) s/p 4v CABG in 2014   Diabetes mellitus without complication (HCC)    Diabetic neuropathy (HCC)    Diabetic peripheral neuropathy (HCC)    Diabetic ulcer of toe of left foot associated with diabetes mellitus of other type, limited to breakdown of skin (HCC) 12/21/2017   Diverticulosis    Gout    Gram-negative bacteremia 05/11/2023   Heart murmur    History of osteomyelitis 05/31/2015   Hypercholesteremia    Hyperlipidemia    Hypertension    Hypotension due to hypovolemia 08/17/2022   Infection of left foot 08/19/2022   MSSA bacteremia 02/28/2023   Open wound of left foot with complication 05/10/2023   Osteomyelitis (HCC) 05/31/2015   Peripheral neuropathy    Postural dizziness with presyncope 02/26/2023   S/P BKA (below knee amputation) unilateral, 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  Take 1 tablet (875 mg total) by mouth every 12 (twelve) hours for 7 days   03/09/2024   cyanocobalamin  (VITAMIN B12) 1000 MCG tablet Take 1 tablet (1,000 mcg total) by mouth daily.   03/09/2024   ELIQUIS  5 MG TABS tablet TAKE 1 TABLET(5 MG) BY MOUTH TWICE DAILY 60 tablet 2 03/10/2024   gabapentin  (NEURONTIN ) 300 MG capsule Take 1 capsule (300 mg total) by mouth 3 (three) times daily. 90 capsule 0 03/10/2024   insulin  glargine (LANTUS ) 100 UNIT/ML Solostar Pen Inject 28 Units into the skin at bedtime. (Patient taking differently: Inject 28 Units into the skin daily.)   03/09/2024   metFORMIN  (GLUCOPHAGE -XR) 500 MG 24 hr tablet Take 1,000 mg by mouth 2 (two) times daily with a meal.   03/10/2024   metoprolol  tartrate (LOPRESSOR ) 25 MG tablet Take 0.5 tablets (12.5 mg total) by mouth 2 (two) times  daily.   03/10/2024   OZEMPIC, 1 MG/DOSE, 4 MG/3ML SOPN Inject 1 mg into the skin once a week.   Past Week   pravastatin  (PRAVACHOL ) 80 MG tablet Take 80 mg by mouth daily.   03/10/2024   traZODone  (DESYREL ) 50 MG tablet Take 50 mg by mouth at bedtime.   03/09/2024   acetaminophen  (TYLENOL ) 325 MG tablet Take 2 tablets (650 mg total) by mouth every 6 (six) hours as needed for mild pain (pain score 1-3) or fever (or Fever >/= 101). (Patient not taking: Reported on 03/07/2024) 30 tablet 0    aspirin  EC 81 MG tablet Take 1 tablet (81 mg total) by mouth daily. Swallow whole. (Patient not taking: Reported on 03/07/2024) 90 tablet 1    Continuous Glucose Sensor (FREESTYLE LIBRE 2 SENSOR) MISC        Dulaglutide  (TRULICITY ) 0.75 MG/0.5ML SOAJ Inject 0.75 mg into the skin once a week. Every Monday (Patient not taking: Reported on 03/12/2024)   Not Taking   glucose blood (ONETOUCH ULTRA) test strip Use 3 (three) times daily      Insulin  Pen Needle (FIFTY50 PEN NEEDLES) 32G X 4 MM MISC USE 4 TIMES DAILY (Patient not taking: Reported on 03/07/2024)      Scheduled:   gabapentin   300 mg Oral TID   insulin  aspart  0-15 Units Subcutaneous TID WC   insulin  aspart  0-5 Units Subcutaneous QHS   insulin  glargine-yfgn  14 Units Subcutaneous Q2200   metoprolol  tartrate  12.5 mg Oral BID   rosuvastatin  20 mg Oral Daily   sodium chloride  flush  3 mL Intravenous Q12H   traZODone   50 mg Oral QHS   Infusions:   ampicillin -sulbactam (UNASYN ) IV 3 g (03/13/24 2154)   diphenhydrAMINE      heparin  1,300 Units/hr (03/13/24 1849)    ASSESSMENT: 73 y.o. male with PMH PAD is presenting with non-healing wounds with ulceration on his right lower extremity and Atherosclerotic occlusive disease. Patient received a percutaneous transluminal angioplasty  on 11/11. Patient was previously on Eliquis  PTA per chart review. Patient has an upcoming procedure this week and will be transitioning to a heparin  gtt on 11/12 @ 7AM per MD Schnier. Pharmacy has been consulted to initiate and manage heparin  intravenous infusion.   Goal(s) of therapy: Heparin  level 0.3 - 0.7 units/mL aPTT 66 - 102 seconds Monitor platelets by anticoagulation protocol: Yes  Date Time aPTT Rate/Comment  11/12  1427 57 Subtherapeutic @ 1000 units/hr 11/12 2243 74 Therapeutic x 1 11/13 0520 60 Subtherapeutic / HL 0.58 11/13 2316 HL 0.50 Therapeutic x 1 after restart    PLAN: Continue heparin  infusion to 1300 units/hour Recheck HL in 8 hrs to confirm CBC daily while on heparin   Rankin CANDIE Dills, PharmD, [facility] 03/14/2024 12:20 AM

## 2024-03-14 NOTE — NC FL2 (Signed)
 [place]  MEDICAID FL2 LEVEL OF CARE FORM     IDENTIFICATION  Patient Name: Andrew Harding Birthdate: 12-13-1950 Sex: male Admission Date (Current Location): 03/11/2024  [facility] and [place] Number:  Chiropodist and Address:  [facility], [address] [place], [place] [address]      Provider Number: [id number]  Attending Physician Name and Address:  Jama Cordella MATSU, MD  Relative Name and Phone Number:       Current Level of Care: Hospital Recommended Level of Care: Skilled Nursing Facility Prior Approval Number:    Date Approved/Denied:   PASRR Number: [id number]  Discharge Plan: SNF    Current Diagnoses: Patient Active Problem List   Diagnosis Date Noted   Chronic atrial fibrillation with RVR (HCC) 03/13/2024   Allergic reaction 03/13/2024   HLD (hyperlipidemia) 03/13/2024   CAD (coronary artery disease) 03/13/2024   Wound healing, delayed 03/12/2024   Atherosclerosis of artery of extremity with ulceration (HCC) 03/11/2024   Infection caused by Enterobacter cloacae 01/16/2024   Osteomyelitis of foot, right, acute (HCC) 01/14/2024   Foot infection 01/10/2024   B12 deficiency anemia 11/08/2023   Iron  deficiency anemia 11/07/2023   PAD (peripheral artery disease) 11/07/2023   Diabetic foot infection (HCC) 11/07/2023   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) 11/01/2023   Osteomyelitis of right foot (HCC) 11/01/2023   Atherosclerosis of native arteries of the extremities with ulceration (HCC) 09/03/2023   Atherosclerosis of native arteries of extremity with intermittent claudication 09/02/2023   S/P BKA (below knee amputation) unilateral, 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   Gram-negative bacteremia 05/11/2023   Sepsis (HCC) 05/10/2023   Open wound of left foot with complication 05/10/2023   MSSA bacteremia 02/28/2023   Controlled type 2 diabetes mellitus with neuropathy (HCC) 02/27/2023   Postural dizziness with presyncope 02/26/2023    Diabetic peripheral neuropathy (HCC)    Wound drainage 09/02/2022   Diabetic foot infection with possible osteomyelitis, left  (HCC) 08/29/2022   S/P transmetatarsal amputation of foot, left (HCC) 08/29/2022   Acute osteomyelitis of left ankle or foot (HCC) 08/24/2022   Medication management 08/24/2022   Diabetic infection of left foot (HCC) 08/24/2022   Infection of left foot 08/19/2022   Atrial fibrillation (HCC) 08/19/2022   Cellulitis 08/17/2022   Hypotension due to hypovolemia 08/17/2022   Gout 04/05/2018   Vitamin D  deficiency, unspecified 04/05/2018   Diabetic ulcer of toe of left foot associated with type 2 diabetes mellitus, limited to breakdown of skin (HCC) 12/21/2017   Status post amputation of toe of right foot 11/01/2015   CKD stage 3a, GFR 45-59 ml/min (HCC) 10/08/2015   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   Osteomyelitis (HCC) 05/31/2015   Type 2 diabetes mellitus with stage 3b chronic kidney disease, with long-term current use of insulin  (HCC) 05/31/2015   History of osteomyelitis 05/31/2015   Benign essential hypertension 09/14/2014   Coronary artery disease 09/17/2012   Mixed hyperlipidemia 09/17/2012   CAD 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 Vascular ulcers on right foot and heel: Wound  vac.)                       Personal Care Assistance Level of Assistance  Bathing, Feeding, Dressing Bathing Assistance: Maximum assistance Feeding assistance: Limited assistance Dressing Assistance: Maximum assistance     Functional  Limitations Info  Sight, Hearing, Speech Sight Info: Adequate Hearing Info: Adequate Speech Info: Adequate    SPECIAL CARE FACTORS FREQUENCY  PT (By licensed PT), OT (By licensed OT)     PT Frequency: 5 x week OT Frequency: 5 x week            Contractures Contractures Info: Not present    Additional Factors Info  Code Status, Allergies Code Status Info: Full code Allergies Info: Cefepime            Current Medications (03/14/2024):  This is the current hospital active medication list Current Facility-Administered Medications  Medication Dose Route Frequency Provider Last Rate Last Admin   acetaminophen  (TYLENOL ) tablet 650 mg  650 mg Oral Q4H PRN Schnier, Gregory G, MD       Ampicillin -Sulbactam (UNASYN ) 3 g in sodium chloride  0.9 % 100 mL IVPB  3 g Intravenous Q8H Schnier, Cordella MATSU, MD 200 mL/hr at 03/14/24 1257 3 g at 03/14/24 1257   apixaban  (ELIQUIS ) tablet 5 mg  5 mg Oral BID Pace, Brien R, NP   5 mg at 03/14/24 9057   aspirin  EC tablet 81 mg  81 mg Oral Daily Pace, Brien R, NP   81 mg at 03/14/24 0941   diphenhydrAMINE  (BENADRYL ) 25 mg in sodium chloride  0.9 % 50 mL IVPB  25 mg Intravenous STAT Schnier, Cordella MATSU, MD       diphenhydrAMINE  (BENADRYL ) injection 25 mg  25 mg Intravenous Q8H PRN Niu, Xilin, MD       gabapentin  (NEURONTIN ) capsule 300 mg  300 mg Oral TID Schnier, Gregory G, MD   300 mg at 03/14/24 0941   hydrALAZINE  (APRESOLINE ) injection 5 mg  5 mg Intravenous Q2H PRN Niu, Xilin, MD       insulin  aspart (novoLOG ) injection 0-15 Units  0-15 Units Subcutaneous TID WC Schnier, Gregory G, MD   3 Units at 03/14/24 1252   insulin  aspart (novoLOG ) injection 0-5 Units  0-5 Units Subcutaneous QHS Schnier, Gregory G, MD   4 Units at 03/13/24 2152   insulin  glargine-yfgn (SEMGLEE ) injection 14 Units  14 Units Subcutaneous Q2200 Niu, Xilin, MD   14 Units at 03/13/24 2153   metoprolol  tartrate (LOPRESSOR ) injection 2.5 mg  2.5 mg Intravenous Q2H PRN Niu, Xilin, MD        metoprolol  tartrate (LOPRESSOR ) tablet 12.5 mg  12.5 mg Oral BID Belue, Nathan S, RPH   12.5 mg at 03/14/24 9057   morphine  (PF) 2 MG/ML injection 2 mg  2 mg Intravenous Q1H PRN Schnier, Gregory G, MD       ondansetron  (ZOFRAN ) injection 4 mg  4 mg Intravenous Q6H PRN Brown, Fallon E, NP       oxyCODONE  (Oxy IR/ROXICODONE ) immediate release tablet 5-10 mg  5-10 mg Oral Q4H PRN Schnier, Gregory G, MD       rosuvastatin (CRESTOR) tablet 20 mg  20 mg Oral Daily Schnier, Gregory G, MD   20 mg at 03/14/24 0941   sodium chloride  flush (NS) 0.9 % injection 3 mL  3 mL Intravenous Q12H Schnier, Cordella MATSU, MD   3 mL at 03/14/24 0951   sodium chloride  flush (NS) 0.9 % injection  3 mL  3 mL Intravenous PRN Schnier, Cordella MATSU, MD       traZODone  (DESYREL ) tablet 50 mg  50 mg Oral QHS Schnier, Gregory G, MD   50 mg at 03/13/24 2152     Discharge Medications: Please see discharge summary for a list of discharge medications.  Relevant Imaging Results:  Relevant Lab Results:   Additional Information SS#: [id number]. Was at [facility] 7/10-9/11 (63 days). Admitted to the hospital on 11/11.  Lauraine JAYSON Carpen, LCSW

## 2024-03-14 NOTE — Inpatient Diabetes Management (Signed)
 Inpatient Diabetes Program Recommendations  AACE/ADA: New Consensus Statement on Inpatient Glycemic Control   Target Ranges:  Prepandial:   less than 140 mg/dL      Peak postprandial:   less than 180 mg/dL (1-2 hours)      Critically ill patients:  140 - 180 mg/dL    Latest Reference Range & Units 03/13/24 07:32 03/13/24 12:10 03/13/24 13:47 03/13/24 15:06 03/13/24 17:00 03/13/24 20:58 03/14/24 08:12  Glucose-Capillary 70 - 99 mg/dL 839 (H) 99 883 (H) 873 (H) 295 (H) 309 (H) 243 (H)   Review of Glycemic Control  Diabetes history: DM2 Outpatient Diabetes medications: Lantus  28 units daily, Ozempic 1 mg Qweek, Metformin  XR 1000 mg BID Current orders for Inpatient glycemic control: Semglee  14 units at bedtime, Novolog  0-15 units TID with meals, Novolog  0-5 units QHS  Inpatient Diabetes Program Recommendations:    Insulin : Patient received Decadron  4 mg at 2:49 on 03/13/24.  Please consider increasing Semglee  to 17 units at bedtime.  Thanks, Earnie Gainer, RN, MSN, CDCES Diabetes Coordinator Inpatient Diabetes Program [phone] (Team Pager from 8am to 5pm)

## 2024-03-14 NOTE — Evaluation (Signed)
 Physical Therapy Evaluation Patient Details Name: Andrew Harding MRN: [id number] DOB: 1951/02/01 Today's Date: 03/14/2024  History of Present Illness  Andrew Harding is a 73yoM who comes to [facility] on 03/11/24 for vascular surgery to address atherosclerotic occlusive disease bilateral lower extremities with ulceration and nonhealing wounds of the right lower extremity. Pt underwent debridement with podiatry 2 days later. Per podiatry patient is to be nonweightbearing on his right lower extremity.  Clinical Impression  Pt able to demonstrate modified independence in bed mobility and prosthesis management, but requires a great deal of physical assistance to perform STS transfers and step pivot transfers. Pt adheres to RLE NWB. Once assist to standing, pt has good load tolerance to Left residual limb in socket for 60sec of SLS prior to taking a few hop steps at bedside. Pt is eager to remain off his Rt foot and improve his independence with transfers and stepping with prosthesis, but he has a way to go- STR can help hip safely achieve success in these areas and improve his safety upon eventual return to home. Patient will benefit from skilled physical therapy intervention to reduce deficits and impairments identified in evaluation, in order to reduce pain, improve quality of life, and maximize activity tolerance for ADL, IADL, and leisure/fitness. Physical therapy will help pt achieve long and short term goals of care.      If plan is discharge home, recommend the following: Help with stairs or ramp for entrance;Assist for transportation;A lot of help with walking and/or transfers;A lot of help with bathing/dressing/bathroom   Can travel by private vehicle   No    Equipment Recommendations None recommended by PT  Recommendations for Other Services       Functional Status Assessment Patient has had a recent decline in their functional status and demonstrates the ability to make significant improvements in  function in a reasonable and predictable amount of time.     Precautions / Restrictions Precautions Precautions: Fall Restrictions Weight Bearing Restrictions Per Provider Order: Yes RLE Weight Bearing Per Provider Order: Non weight bearing      Mobility  Bed Mobility Overal bed mobility: Modified Independent                  Transfers Overall transfer level: Needs assistance Equipment used: Rolling walker (2 wheels) Transfers: Sit to/from Stand, Bed to chair/wheelchair/BSC Sit to Stand: Contact guard assist, Min assist, From elevated surface Stand pivot transfers: Mod assist              Ambulation/Gait Ambulation/Gait assistance: Contact guard assist Gait Distance (Feet): 2 Feet Assistive device: Rolling walker (2 wheels)            Stairs            Wheelchair Mobility     Tilt Bed    Modified Rankin (Stroke Patients Only)       Balance                                             Pertinent Vitals/Pain Pain Assessment Pain Assessment: No/denies pain    Home Living Family/patient expects to be discharged to:: Private residence Living Arrangements: Alone Available Help at Discharge: Family;Friend(s);Available PRN/intermittently Type of Home: House Home Access: Stairs to enter Entrance Stairs-Rails: Right Entrance Stairs-Number of Steps: 5   Home Layout: One level Home Equipment: Agricultural Consultant (2 wheels);Cane -  single point;Wheelchair - manual;Grab bars - tub/shower;Tub bench      Prior Function                       Extremity/Trunk Assessment                Communication        Cognition                                         Cueing       General Comments      Exercises Other Exercises Other Exercises: STS from significant bed elevation (RLE NWB), LLE prosthesis donned: to RW, then stands x60sec Other Exercises: forward steps, backward steps, and 90 degree turns  both directions with RW, prosthesis: maintains RLE NWB Other Exercises: Step pivot from elevated EOB to RW, minGuard A, near max effort Other Exercises: Step pivot from guest chair to RW, then to elevated bed (modA for full rise to standing, poster LOB during process halfway up. Other Exercises: seated EOB self management of preosthesis shrinkers, socks, socket.   Assessment/Plan    PT Assessment Patient needs continued PT services  PT Problem List Decreased strength;Decreased activity tolerance;Decreased balance;Decreased mobility       PT Treatment Interventions DME instruction;Therapeutic exercise;Gait training;Balance training;Stair training;Neuromuscular re-education;Wheelchair mobility training;Functional mobility training;Therapeutic activities;Patient/family education    PT Goals (Current goals can be found in the Care Plan section)  Acute Rehab PT Goals Patient Stated Goal: be adherent to RLE NWB and allow for healing PT Goal Formulation: With patient Time For Goal Achievement: 03/28/24 Potential to Achieve Goals: Good    Frequency Min 3X/week     Co-evaluation               AM-PAC PT 6 Clicks Mobility  Outcome Measure Help needed turning from your back to your side while in a flat bed without using bedrails?: None Help needed moving from lying on your back to sitting on the side of a flat bed without using bedrails?: None Help needed moving to and from a bed to a chair (including a wheelchair)?: A Lot Help needed standing up from a chair using your arms (e.g., wheelchair or bedside chair)?: A Lot Help needed to walk in hospital room?: A Little Help needed climbing 3-5 steps with a railing? : A Lot 6 Click Score: 17    End of Session   Activity Tolerance: Patient tolerated treatment well;Patient limited by fatigue Patient left: in bed;with call bell/phone within reach Nurse Communication: Mobility status PT Visit Diagnosis: Unsteadiness on feet  (R26.81);Other abnormalities of gait and mobility (R26.89);Difficulty in walking, not elsewhere classified (R26.2)    Time: 8475-8455 PT Time Calculation (min) (ACUTE ONLY): 20 min   Charges:   PT Evaluation $PT Eval Moderate Complexity: 1 Mod PT Treatments $Therapeutic Activity: 8-22 mins PT General Charges $$ ACUTE PT VISIT: 1 Visit    4:00 PM, 03/14/24 Peggye JAYSON Linear, PT, DPT Physical Therapist - [facility]  [phone] (ASCOM)    Ell Tiso C 03/14/2024, 3:57 PM

## 2024-03-14 NOTE — Progress Notes (Signed)
 Progress Note    03/14/2024 8:53 AM 1 Day Post-Op  Subjective:   Rudra Hobbins is a 73 yo male now POD #3 from:   Date of Surgery: 03/11/2024   Surgeon:  Cordella Shawl   Pre-operative Diagnosis: Atherosclerotic occlusive disease bilateral lower extremities with ulceration and nonhealing wounds of the right lower extremity   Post-operative diagnosis:  Same   Procedure(s) Performed:             1.  Introduction catheter into right lower extremity 3rd order catheter placement with additional third order catheter placement             2.  Contrast injection right lower extremity for distal runoff with additional 3rd order             3.  Percutaneous transluminal angioplasty right anterior tibial artery                          4.  Star close closure left common femoral arteriotomy  Operative note: 03/13/2024    Surgeon:Justin Fowler     Assistant: None     Preop diagnosis: Necrotic ulcer lateral right foot     Postop diagnosis: Same     Procedure: 1 excision fifth metatarsal right foot 2.  Application of wound VAC right foot 3.  Intraoperative fluoroscopy without assistance of radiologist Patient is recovering as expected.  Patient had a reaction to an IV antibiotic last night.  He was given Benadryl  IV and all the symptoms resolved 20 minutes later.  No complications to note.  No complications overnight. Patient endorses leg feels much better.  Per podiatry patient is to be nonweightbearing on his right lower extremity.  Vitals are remained stable.   Vitals:   03/14/24 0434 03/14/24 0800  BP: 103/66 108/72  Pulse: 79 71  Resp: 20 19  Temp: 98.4 F (36.9 C) 98.3 F (36.8 C)  SpO2: 99% 99%   Physical Exam: Cardiac:  Irregular Hear Rate Hx of Atrial Fibrillation  Lungs: Nonlabored breathing, clear on auscultation throughout, no rales rhonchi or wheezing. Incisions: Left groin puncture site with dressing clean dry and intact.  No hematoma seroma noted. Extremities: Right  lower extremity with Doppler pulses only.  Extremities warm to touch. Abdomen: Positive bowel sounds throughout, soft, nontender nondistended. Neurologic: Alert and oriented x 3, answers all questions follows commands appropriately.  CBC    Component Value Date/Time   WBC 7.6 03/13/2024 0520   RBC 4.27 03/13/2024 0520   HGB 11.6 (L) 03/13/2024 0520   HCT 36.6 (L) 03/13/2024 0520   PLT 273 03/13/2024 0520   MCV 85.7 03/13/2024 0520   MCH 27.2 03/13/2024 0520   MCHC 31.7 03/13/2024 0520   RDW 14.0 03/13/2024 0520   LYMPHSABS 1.2 01/10/2024 1218   MONOABS 0.4 01/10/2024 1218   EOSABS 0.3 01/10/2024 1218   BASOSABS 0.1 01/10/2024 1218    BMET    Component Value Date/Time   NA 138 03/13/2024 2022   K 4.4 03/13/2024 2022   CL 104 03/13/2024 2022   CO2 22 03/13/2024 2022   GLUCOSE 352 (H) 03/13/2024 2022   GLUCOSE 402 (H) 09/17/2012 1417   BUN 16 03/13/2024 2022   CREATININE 1.37 (H) 03/13/2024 2022   CALCIUM 8.8 (L) 03/13/2024 2022   GFRNONAA 54 (L) 03/13/2024 2022   GFRAA 58 (L) 06/02/2015 0656    INR    Component Value Date/Time   INR 1.2 01/12/2024 1433  Intake/Output Summary (Last 24 hours) at 03/14/2024 0853 Last data filed at 03/14/2024 0715 Gross per 24 hour  Intake 1304.11 ml  Output 550 ml  Net 754.11 ml     Assessment/Plan:  73 y.o. male is s/p SEE ABOVE  1 Day Post-Op   Plan Patient is awaiting placement to rehab as he is nonweightbearing on his right lower extremity for the next 2 weeks at a minimum. ID consult was placed today as the patient has been followed by ID outpatient for same infection to his right foot. Heparin  infusion discontinued today.  Patient started on Eliquis  5 mg twice daily and aspirin  81 mg daily. Diabetes education consult was placed to help patient assist with blood sugars.  DVT prophylaxis: Heparin  infusion to be converted to oral anticoagulation today   [name] [facility] 03/14/2024 8:53  AM

## 2024-03-14 NOTE — Progress Notes (Signed)
   03/14/24 1100  Spiritual Encounters  Type of Visit Initial  Care provided to: Patient;Friend  Referral source Chaplain team  Reason for visit Routine spiritual support  OnCall Visit Yes  Spiritual Framework  Presenting Themes Community and relationships  Community/Connection Family;Friend(s)  Patient Stress Factors Health changes  Family Stress Factors None identified  Interventions  Spiritual Care Interventions Made Compassionate presence;Reflective listening;Narrative/life review   Follow up with patient after rapid response the evening prior.  Patient feeling much better and engaged in meaningful conversation.  Provided support and compassionate listening.

## 2024-03-14 NOTE — TOC Initial Note (Addendum)
 Transition of Care [facility]) - Initial/Assessment Note    Patient Details  Name: Andrew Harding MRN: [id number] Date of Birth: 1950/05/31  Transition of [facility] CM/SW Contact:    Lauraine JAYSON Carpen, LCSW Phone Number: 03/14/2024, 2:46 PM  Clinical Narrative:   Readmission prevention screen complete. CSW met with patient. Friend at bedside. CSW introduced role and explained that discharge planning would be discussed. PCP is Ophelia Sage, MD. Alyse drives him to appointments. Pharmacy is [facility] on the corner of [address] and [address]. No issues affording medications. Patient lives home alone. No home health prior to admission. He has railing, modified steps, RW, cane, wheelchair, shower seat, handheld shower head, and raised toilets at home. Patient stated that he will need SNF placement at discharge. Vascular will consult PT and OT. Preferences are [facility] and [facility]. No further concerns. CSW will continue to follow patient for support and facilitate discharge once medically stable.               4:22 pm: Sent out SNF referral. Left message for [facility] asking them to review. [facility] was at [facility] 7/10-9/11 (63 days). He readmitted to the hospital on 11/11.  Expected Discharge Plan: Skilled Nursing Facility Barriers to Discharge: Continued Medical Work up   Patient Goals and CMS Choice            Expected Discharge Plan and Services     Post Acute Care Choice: Skilled Nursing Facility Living arrangements for the past 2 months: Single Family Home                                      Prior Living Arrangements/Services Living arrangements for the past 2 months: Single Family Home Lives with:: Self Patient language and need for interpreter reviewed:: Yes Do you feel safe going back to the place where you live?: Yes      Need for Family Participation in Patient Care: Yes (Comment) Care giver support system in place?: Yes (comment) Current  home services: DME Criminal Activity/Legal Involvement Pertinent to Current Situation/Hospitalization: No - Comment as needed  Activities of Daily Living   ADL Screening (condition at time of admission) Independently performs ADLs?: Yes (appropriate for developmental age) Does the patient have a NEW difficulty with bathing/dressing/toileting/self-feeding that is expected to last >3 days?: No Does the patient have a NEW difficulty with getting in/out of bed, walking, or climbing stairs that is expected to last >3 days?: No Does the patient have a NEW difficulty with communication that is expected to last >3 days?: No Is the patient deaf or have difficulty hearing?: Yes Does the patient have difficulty seeing, even when wearing glasses/contacts?: No Does the patient have difficulty concentrating, remembering, or making decisions?: No  Permission Sought/Granted Permission sought to share information with : Facility Medical Sales Representative, Family Supports Permission granted to share information with : Yes, Verbal Permission Granted  Share Information with NAME: Renay Moats  Permission granted to share info w AGENCY: SNF's  Permission granted to share info w Relationship: Friend  Permission granted to share info w Contact Information: [phone]  Emotional Assessment Appearance:: Appears stated age Attitude/Demeanor/Rapport: Engaged, Gracious Affect (typically observed): Accepting, Appropriate, Calm, Pleasant Orientation: : Oriented to Self, Oriented to Place, Oriented to  Time, Oriented to Situation Alcohol / Substance Use: Not Applicable Psych Involvement: No (comment)  Admission diagnosis:  Atherosclerosis of artery of  extremity with ulceration (HCC) [I70.299, L97.909] Wound healing, delayed [T14.8XXD] Patient Active Problem List   Diagnosis Date Noted   Chronic atrial fibrillation with RVR (HCC) 03/13/2024   Allergic reaction 03/13/2024   HLD (hyperlipidemia) 03/13/2024   CAD  (coronary artery disease) 03/13/2024   Wound healing, delayed 03/12/2024   Atherosclerosis of artery of extremity with ulceration (HCC) 03/11/2024   Infection caused by Enterobacter cloacae 01/16/2024   Osteomyelitis of foot, right, acute (HCC) 01/14/2024   Foot infection 01/10/2024   B12 deficiency anemia 11/08/2023   Iron  deficiency anemia 11/07/2023   PAD (peripheral artery disease) 11/07/2023   Diabetic foot infection (HCC) 11/07/2023   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) 11/01/2023   Osteomyelitis of right foot (HCC) 11/01/2023   Atherosclerosis of native arteries of the extremities with ulceration (HCC) 09/03/2023   Atherosclerosis of native arteries of extremity with intermittent claudication 09/02/2023   S/P BKA (below knee amputation) unilateral, 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   Gram-negative bacteremia 05/11/2023   Sepsis (HCC) 05/10/2023   Open wound of left foot with complication 05/10/2023   MSSA bacteremia 02/28/2023   Controlled type 2 diabetes mellitus with neuropathy (HCC) 02/27/2023   Postural dizziness with presyncope 02/26/2023   Diabetic peripheral neuropathy (HCC)    Wound drainage 09/02/2022   Diabetic foot infection with possible osteomyelitis, left  (HCC) 08/29/2022   S/P transmetatarsal amputation of foot, left (HCC) 08/29/2022   Acute osteomyelitis of left ankle or foot (HCC) 08/24/2022   Medication management 08/24/2022   Diabetic infection of left foot (HCC) 08/24/2022   Infection of left foot 08/19/2022   Atrial fibrillation (HCC) 08/19/2022   Cellulitis 08/17/2022   Hypotension due to hypovolemia 08/17/2022   Gout 04/05/2018   Vitamin D  deficiency, unspecified 04/05/2018   Diabetic ulcer of toe of left foot associated with type 2 diabetes mellitus, limited to breakdown of skin (HCC) 12/21/2017   Status post amputation of toe of right foot 11/01/2015   CKD stage 3a, GFR 45-59 ml/min (HCC) 10/08/2015   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    Osteomyelitis (HCC) 05/31/2015   Type 2 diabetes mellitus with stage 3b chronic kidney disease, with long-term current use of insulin  (HCC) 05/31/2015   History of osteomyelitis 05/31/2015   Benign essential hypertension 09/14/2014   Coronary artery disease 09/17/2012   Mixed hyperlipidemia 09/17/2012   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 [place] [address] Phone: [phone] Fax: [phone]     Social Drivers of Health (SDOH) Social History: SDOH Screenings   Food Insecurity: No Food Insecurity (03/12/2024)  Housing: Low Risk  (03/12/2024)  Transportation Needs: No Transportation Needs (03/12/2024)  Utilities: Not At Risk (03/12/2024)  Depression (PHQ2-9): Low Risk  (05/03/2023)  Financial Resource Strain: Low Risk  (02/22/2024)   Received from [facility]  Social Connections: Unknown (03/12/2024)  Recent Concern: Social Connections - Moderately Isolated (01/10/2024)  Tobacco Use: Medium Risk (03/13/2024)   SDOH Interventions:     Readmission Risk Interventions    03/14/2024    2:43 PM 01/14/2024   10:52 AM 02/28/2023   12:53 PM  Readmission Risk Prevention Plan  Transportation Screening Complete Complete Complete  PCP or Specialist  Appt within 3-5 Days Complete Complete Complete  HRI or Home Care Consult  Complete Complete  Social Work Consult for Recovery Care Planning/Counseling Complete Complete Complete  Palliative Care Screening Not Applicable Not Applicable Not Applicable  Medication Review (RN Care Manager) Complete Complete Complete

## 2024-03-15 LAB — GLUCOSE, CAPILLARY
Glucose-Capillary: 166 mg/dL — ABNORMAL HIGH (ref 70–99)
Glucose-Capillary: 149 mg/dL — ABNORMAL HIGH (ref 70–99)
Glucose-Capillary: 192 mg/dL — ABNORMAL HIGH (ref 70–99)
Glucose-Capillary: 227 mg/dL — ABNORMAL HIGH (ref 70–99)

## 2024-03-15 LAB — BASIC METABOLIC PANEL WITH GFR
Anion gap: 10 (ref 5–15)
BUN: 14 mg/dL (ref 8–23)
CO2: 23 mmol/L (ref 22–32)
Calcium: 8.6 mg/dL — ABNORMAL LOW (ref 8.9–10.3)
Chloride: 109 mmol/L (ref 98–111)
Creatinine, Ser: 1.17 mg/dL (ref 0.61–1.24)
GFR, Estimated: >60 mL/min (ref >60)
Glucose, Bld: 195 mg/dL — ABNORMAL HIGH (ref 70–99)
Potassium: 3.7 mmol/L (ref 3.5–5.1)
Sodium: 142 mmol/L (ref 135–145)

## 2024-03-15 LAB — CBC
HCT: 30.1 % — ABNORMAL LOW (ref 39.0–52.0)
Hemoglobin: 10.2 g/dL — ABNORMAL LOW (ref 13.0–17.0)
MCH: 28.1 pg (ref 26.0–34.0)
MCHC: 33.9 g/dL (ref 30.0–36.0)
MCV: 82.9 fL (ref 80.0–100.0)
Platelets: 212 K/uL (ref 150–400)
RBC: 3.63 MIL/uL — ABNORMAL LOW (ref 4.22–5.81)
RDW: 13.7 % (ref 11.5–15.5)
WBC: 6.8 K/uL (ref 4.0–10.5)
nRBC: 0 % (ref 0.0–0.2)

## 2024-03-15 NOTE — Progress Notes (Signed)
 2 Days Post-Op   Subjective/Chief Complaint: Without complaint. Pain controlled in RIGHT foot with current regimen   Objective: Vital signs in last 24 hours: Temp:  [97.7 F (36.5 C)-98.1 F (36.7 C)] 97.8 F (36.6 C) (11/15 0733) Pulse Rate:  [64-81] 69 (11/15 0733) Resp:  [16-20] 18 (11/15 0733) BP: (105-131)/(64-83) 131/83 (11/15 0733) SpO2:  [98 %-100 %] 98 % (11/15 0733) Last BM Date : 03/14/24  Intake/Output from previous day: 11/14 0701 - 11/15 0700 In: 603 [P.O.:400; I.V.:3; IV Piggyback:200] Out: 1500 [Urine:1500] Intake/Output this shift: No intake/output data recorded.  General appearance: alert and no distress Extremities: RIGHT foot with VAC dressing, warm; LEFT groin access site- soft, no hematoma  Lab Results:  Recent Labs    03/14/24 0926 03/15/24 0548  WBC 9.9 6.8  HGB 10.3* 10.2*  HCT 31.0* 30.1*  PLT 218 212   BMET Recent Labs    03/13/24 2022 03/15/24 0548  NA 138 142  K 4.4 3.7  CL 104 109  CO2 22 23  GLUCOSE 352* 195*  BUN 16 14  CREATININE 1.37* 1.17  CALCIUM 8.8* 8.6*   PT/INR No results for input(s): LABPROT, INR in the last 72 hours. ABG No results for input(s): PHART, HCO3 in the last 72 hours.  Invalid input(s): PCO2, PO2  Studies/Results: DG MINI C-ARM IMAGE ONLY Result Date: 03/13/2024 There is no interpretation for this exam.  This order is for images obtained during a surgical procedure.  Please See Surgeries Tab for more information regarding the procedure.    Anti-infectives: Anti-infectives (From admission, onward)    Start     Dose/Rate Route Frequency Ordered Stop   03/14/24 0600  ceFAZolin  (ANCEF ) IVPB 2g/100 mL premix        2 g 200 mL/hr over 30 Minutes Intravenous On call to O.R. 03/13/24 1203 03/13/24 1314   03/13/24 2200  Ampicillin -Sulbactam (UNASYN ) 3 g in sodium chloride  0.9 % 100 mL IVPB        3 g 200 mL/hr over 30 Minutes Intravenous Every 8 hours 03/13/24 1850     03/13/24 1500   ceFEPIme  (MAXIPIME ) 2 g in sodium chloride  0.9 % 100 mL IVPB  Status:  Discontinued        2 g 200 mL/hr over 30 Minutes Intravenous Every 8 hours 03/13/24 1401 03/13/24 1845   03/11/24 1452  ceFAZolin  (ANCEF ) IVPB 2g/100 mL premix        2 g 200 mL/hr over 30 Minutes Intravenous 30 min pre-op 03/11/24 1452 03/11/24 1632       Assessment/Plan: POD # 2 s/p RIGHT Anterior tibial angioplasty  RIGHT foot VAC and care per Podiatry Continue Eliquis /ASA OK from Vascular standpoint for discharge to Rehab  LOS: 3 days    Tisa Dakin A 03/15/2024

## 2024-03-15 NOTE — Plan of Care (Signed)
   Problem: Metabolic: Goal: Ability to maintain appropriate glucose levels will improve Outcome: Progressing

## 2024-03-15 NOTE — Evaluation (Signed)
 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 Pt underwent debridement with podiatry 2 days later. Per podiatry patient is to be nonweightbearing on his right lower extremity.     Clinical Impressions Pt. presents with RLE NWBing status, and limited functional mobility which limits his ability to complete basic ADL and IADL functioning.  Pt. Resides at home alone. Pt. Was independent with ADLs, and IADL functioning: including meal preparation, and medication management. Pt. Was able to drive. 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  Pt. was attending [facility] for a foot infection prior to this hospital admission.  Pt. Reports having found ways to modify daily tasks that work for him. Pt. Requires complete set-up for meals, and self-grooming tasks. Anticipate set-up for UE dressing, and modA for LE dressing. Pt. Would benefit from UE strengthening to improve UE strength needed for walker use with NWB.Pt. Will  benefit from OT services for ADL training, A/E training, UE there. Ex. and Pt./caregiver education about home modification, and DME.      If plan is discharge home, recommend the following:   A little help with walking and/or transfers;A lot of help with bathing/dressing/bathroom;Assistance with cooking/housework     Functional Status Assessment   Patient has had a recent decline in their functional status and demonstrates the ability to make significant improvements in function in a reasonable and predictable amount of time.     Equipment Recommendations         Recommendations for Other Services         Precautions/Restrictions    Precautions Precautions: Fall Recall of Precautions/Restrictions: Intact Restrictions Weight Bearing Restrictions Per Provider Order: Yes RLE Weight Bearing Per Provider Order: Non weight bearing     Mobility Bed Mobility Overal bed mobility: Modified Independent                  Transfers                   General transfer comment: Deferred      Balance                                           ADL either performed or assessed with clinical judgement   ADL Overall ADL's : Needs assistance/impaired Eating/Feeding: Independent;Set up   Grooming: Set up;Sitting;Independent                                 General ADL Comments: Anticipate independence with complete setup for UE dressing, and modA LE dressing     Vision Baseline Vision/History: 1 Wears glasses (For reading) Patient Visual Report: No change from baseline       Perception         Praxis         Pertinent Vitals/Pain Pain Assessment Pain Assessment: No/denies pain     Extremity/Trunk Assessment Upper Extremity Assessment Upper Extremity Assessment: Overall WFL for tasks assessed           Communication Communication Communication: No apparent difficulties   Cognition  Arousal: Alert Behavior During Therapy: WFL for tasks assessed/performed Cognition: No apparent impairments                               Following commands: Intact       Cueing  General Comments   Cueing Techniques: Verbal cues      Exercises     Shoulder Instructions      Home Living Family/patient expects to be discharged to:: Private residence Living Arrangements: Alone Available Help at Discharge: Family;Friend(s);Available PRN/intermittently Type of Home: House Home Access: Stairs to enter   Entrance Stairs-Rails: Right Home Layout: One level     Bathroom Shower/Tub: Chief Strategy Officer: Handicapped height Bathroom  Accessibility: Yes   Home Equipment: Agricultural Consultant (2 wheels);Cane - single point;Wheelchair - manual;Grab bars - tub/shower;Tub bench          Prior Functioning/Environment               Mobility Comments: has L LE prosthesis ADLs Comments: Pt. was at [facility] for a right foot infection prior to this hospital admission. Pt. reports that he was previously independent.    OT Problem List: Decreased strength;Decreased range of motion;Decreased activity tolerance;Decreased coordination   OT Treatment/Interventions: Self-care/ADL training;Therapeutic exercise;Neuromuscular education;Energy conservation;DME and/or AE instruction;Therapeutic activities;Patient/family education      OT Goals(Current goals can be found in the care plan section)   Acute Rehab OT Goals Patient Stated Goal: To  retrun to rehab OT Goal Formulation: With patient Time For Goal Achievement: 03/29/24 Potential to Achieve Goals: Good   OT Frequency:  Min 2X/week    Co-evaluation              AM-PAC OT 6 Clicks Daily Activity     Outcome Measure Help from another person eating meals?: None Help from another person taking care of personal grooming?: None Help from another person toileting, which includes using toliet, bedpan, or urinal?: A Lot Help from another person bathing (including washing, rinsing, drying)?: A Lot Help from another person to put on and taking off regular upper body clothing?: A Little Help from another person to put on and taking off regular lower body clothing?: A Lot 6 Click Score: 17   End of Session    Activity Tolerance: Patient tolerated treatment well Patient left: in bed;with call bell/phone within reach;with bed alarm set  OT Visit Diagnosis: Muscle weakness (generalized) (M62.81)                Time: 8444-8386 OT Time Calculation (min): 18 min Charges:  OT General Charges $OT Visit: 1 Visit OT Evaluation $OT Eval Moderate Complexity: 1 Mod Richardson Otter, MS, OTR/L   Richardson Otter 03/15/2024, 4:34 PM

## 2024-03-15 NOTE — Plan of Care (Signed)
  Problem: Metabolic: Goal: Ability to maintain appropriate glucose levels will improve Outcome: Progressing   Problem: Nutritional: Goal: Maintenance of adequate nutrition will improve Outcome: Progressing Goal: Progress toward achieving an optimal weight will improve Outcome: Progressing   Problem: Skin Integrity: Goal: Risk for impaired skin integrity will decrease Outcome: Progressing   Problem: Activity: Goal: Ability to return to baseline activity level will improve Outcome: Progressing   Problem: Cardiovascular: Goal: Ability to achieve and maintain adequate cardiovascular perfusion will improve Outcome: Progressing Goal: Vascular access site(s) Level 0-1 will be maintained Outcome: Progressing   Problem: Clinical Measurements: Goal: Will remain free from infection Outcome: Progressing Goal: Diagnostic test results will improve Outcome: Progressing Goal: Cardiovascular complication will be avoided Outcome: Progressing   Problem: Nutrition: Goal: Adequate nutrition will be maintained Outcome: Progressing

## 2024-03-15 NOTE — Progress Notes (Signed)
 PROGRESS NOTE    Andrew Harding  FMW:[id number] DOB: Feb 13, 1951 DOA: 03/11/2024 PCP: Fernande Ophelia [name], MD   Assessment & Plan:   Principal Problem:   Atherosclerosis of artery of extremity with ulceration (HCC) Active Problems:   Diabetic foot infection (HCC)   Chronic atrial fibrillation with RVR (HCC)   Allergic reaction   Benign essential hypertension   HLD (hyperlipidemia)   CAD (coronary artery disease)   Type 2 diabetes mellitus with stage 3b chronic kidney disease, with long-term current use of insulin  (HCC)  Assessment and Plan:  Atherosclerosis of artery of extremity with ulceration and diabetic foot infection-right foot: s/p excision fifth metatarsal right foot, with application of wound VAC right foot by podiatry. Continue on IV unasyn  as per ID. Wound cxs NGTD so far. Nonweightbearing of right foot and will d/c w/ wound vac as per podiatry   Chronic a. fib: w/ RVR. Continue on home dose of metoprolol , eliquis . IV metoprolol  prn.   Allergic reaction: likely to cefepime . W/ itching all over & tachycardia which have resolved. Benadryl  prn    HTN: continue on metoprolol     HLD: continue on statin    Hx of CAD: continue on statin, metoprolol     DM2: well controlled, HbA1c 5.7. Continue on glargine, SSI w/ accuchecks   CKDIIIb: Cr is trending down from day prior. Avoid nephrotoxic meds       DVT prophylaxis: eliquis  Code Status: full  Family Communication:  Disposition Plan: as per primary team   Level of care: Progressive Consultants:  Hospitalist  ID Podiatry   Procedures:   Antimicrobials: unasyn     Subjective: Pt c/o malaise  Objective: Vitals:   03/14/24 1623 03/14/24 2015 03/15/24 0316 03/15/24 0733  BP: 105/70 112/70 110/64 131/83  Pulse: 72 64 71 69  Resp: 18 20 18 18   Temp: 98.1 F (36.7 C) 97.9 F (36.6 C) 97.9 F (36.6 C) 97.8 F (36.6 C)  TempSrc:      SpO2: 99% 99% 100% 98%  Weight:      Height:        Intake/Output Summary  (Last 24 hours) at 03/15/2024 0945 Last data filed at 03/15/2024 0318 Gross per 24 hour  Intake 603 ml  Output 950 ml  Net -347 ml   Filed Weights   03/11/24 1508 03/13/24 0531 03/13/24 1208  Weight: 82 kg 82 kg 82 kg    Examination:  General exam: appears comfortable   Respiratory system: clear breath sounds b/l  Cardiovascular system: S1/S2+. No rubs or clicks Gastrointestinal system: abd is soft, NT, ND & hypoactive bowel sounds Central nervous system: alert & oriented. Moves all extremities  Psychiatry: Judgement and insight appears normal. Appropriate mood and affect     Data Reviewed: I have personally reviewed following labs and imaging studies  CBC: Recent Labs  Lab 03/12/24 0553 03/13/24 0520 03/14/24 0926 03/15/24 0548  WBC 8.7 7.6 9.9 6.8  HGB 11.2* 11.6* 10.3* 10.2*  HCT 34.3* 36.6* 31.0* 30.1*  MCV 83.5 85.7 84.0 82.9  PLT 271 273 218 212   Basic Metabolic Panel: Recent Labs  Lab 03/11/24 1505 03/13/24 2022 03/15/24 0548  NA  --  138 142  K  --  4.4 3.7  CL  --  104 109  CO2  --  22 23  GLUCOSE  --  352* 195*  BUN 17 16 14   CREATININE 1.25* 1.37* 1.17  CALCIUM  --  8.8* 8.6*   GFR: Estimated Creatinine Clearance: 64.2  mL/min (by C-G formula based on SCr of 1.17 mg/dL). Liver Function Tests: No results for input(s): AST, ALT, ALKPHOS, BILITOT, PROT, ALBUMIN in the last 168 hours. No results for input(s): LIPASE, AMYLASE in the last 168 hours. No results for input(s): AMMONIA in the last 168 hours. Coagulation Profile: No results for input(s): INR, PROTIME in the last 168 hours. Cardiac Enzymes: No results for input(s): CKTOTAL, CKMB, CKMBINDEX, TROPONINI in the last 168 hours. BNP (last 3 results) No results for input(s): PROBNP in the last 8760 hours. HbA1C: No results for input(s): HGBA1C in the last 72 hours. CBG: Recent Labs  Lab 03/14/24 0812 03/14/24 1220 03/14/24 1628 03/14/24 2140  03/15/24 0837  GLUCAP 243* 160* 168* 189* 166*   Lipid Profile: No results for input(s): CHOL, HDL, LDLCALC, TRIG, CHOLHDL, LDLDIRECT in the last 72 hours. Thyroid Function Tests: No results for input(s): TSH, T4TOTAL, FREET4, T3FREE, THYROIDAB in the last 72 hours. Anemia Panel: No results for input(s): VITAMINB12, FOLATE, FERRITIN, TIBC, IRON , RETICCTPCT in the last 72 hours. Sepsis Labs: No results for input(s): PROCALCITON, LATICACIDVEN in the last 168 hours.  Recent Results (from the past 240 hours)  Aerobic/Anaerobic Culture w Gram Stain (surgical/deep wound)     Status: None (Preliminary result)   Collection Time: 03/13/24  1:00 PM   Specimen: Path fluid; Body Fluid  Result Value Ref Range Status   Specimen Description ABSCESS  Final   Special Requests RIGHT FIFTH METATARSAL  Final   Gram Stain NO WBC SEEN NO ORGANISMS SEEN   Final   Culture   Final    NO GROWTH < 12 HOURS Performed at [facility], [address] [place], [place] [address]    Report Status PENDING  Incomplete         Radiology Studies: DG MINI C-ARM IMAGE ONLY Result Date: 03/13/2024 There is no interpretation for this exam.  This order is for images obtained during a surgical procedure.  Please See Surgeries Tab for more information regarding the procedure.        Scheduled Meds:  apixaban   5 mg Oral BID   aspirin  EC  81 mg Oral Daily   gabapentin   300 mg Oral TID   insulin  aspart  0-15 Units Subcutaneous TID WC   insulin  aspart  0-5 Units Subcutaneous QHS   insulin  glargine-yfgn  14 Units Subcutaneous Q2200   metoprolol  tartrate  12.5 mg Oral BID   rosuvastatin  20 mg Oral Daily   sodium chloride  flush  3 mL Intravenous Q12H   traZODone   50 mg Oral QHS   Continuous Infusions:  ampicillin -sulbactam (UNASYN ) IV 3 g (03/15/24 0512)     LOS: 3 days      Anthony CHRISTELLA Pouch, MD [facility] Pager [phone]  If 7PM-7AM,  please contact night-coverage [email or web address] 03/15/2024, 9:45 AM

## 2024-03-15 NOTE — Progress Notes (Addendum)
 PODIATRY: PROGRESS NOTE    Surgery:  Procedure(s) (LRB): IRRIGATION AND DEBRIDEMENT FOOT (Right) APPLICATION, WOUND VAC (Right) POD:  2 Days Post-Op  O/N: NAEON  Subjective:  Patient resting comfortably at bedside.  Did have a reaction over the past couple of days to suspected cefepime - no lingering effects. Is wondering how fast healing will come about with the wound vac. No issues with vac placement / seal.  Denies F/C/N/V/SOB/CP. Denies acute calf pain.    PHYSICAL EXAMINATION: BP 131/83 (BP Location: Right Arm)   Pulse 69   Temp 97.8 F (36.6 C)   Resp 18   Ht 6' 3 (1.905 m)   Wt 82 kg   SpO2 98%   BMI 22.60 kg/m ? GEN: NAD. AOX3. ? RESP: Non-labored breathing on RA.? ABD: NT/ND of all four quadrants.? NEURO: Moving all four extremities spontaneously. ? ? FOCUSED LOWER EXTREMITY EXAMINATION:? NEURO: ? - LOPS - diminished gross touch  - No paresthesias elicited on examination. ??  VASCULAR: ? - DP/PT - diminished  - Focal edema noted adjacent to surgical site ; as expected for post-operative state. ??  MSK: ? - s/p L BKA  - S/p R TMA  - TTP none? - No calf tenderness RLE ?  DERM: ? - Wound VAC holding seal - No proximal streaking. There is mild odor coming from wound with fibrosis of the plantar / proximal wound bed.. No active purulence. Bone exposed in wound bed appears healthy / viable quality. There is maceration to the proximal aspect of the wound with mild necrosis of tissue and maceration with fibrotic tissue to the suture line distally.  - Minimal drainage in wound vac cannister. - No clinical signs of infection noted. ??        Results for orders placed or performed during the hospital encounter of 03/11/24  Aerobic/Anaerobic Culture w Gram Stain (surgical/deep wound)     Status: None (Preliminary result)   Collection Time: 03/13/24  1:00 PM   Specimen: Path fluid; Body Fluid  Result Value Ref Range Status   Specimen Description ABSCESS   Final   Special Requests RIGHT FIFTH METATARSAL  Final   Gram Stain NO WBC SEEN NO ORGANISMS SEEN   Final   Culture   Final    NO GROWTH < 12 HOURS Performed at [facility], [address], [name], [place] [address]    Report Status PENDING  Incomplete    ID Type Source Tests Collected by Time Destination  1 : right foot tissue/.bone Tissue Foot, Right SURGICAL PATHOLOGY [name], DPM 01/11/2024 1024   A : Culture, right foot Tissue Foot, Right AEROBIC/ANAEROBIC CULTURE W GRAM STAIN (SURGICAL/DEEP WOUND) [name], DPM 01/11/2024 1013   B : Bone Culture, Right foot Blood Foot, Right AEROBIC/ANAEROBIC CULTURE W GRAM STAIN (SURGICAL/DEEP WOUND) [name], DPM 01/11/2024 1017      ASSESSMENT:?  Andrew Harding is a 73 y.o. 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  Wound vac changed today at bedside (procedure on how to apply noted below) 03/15/2024 - 02394 DME <=50sqcm    PLAN:? - Activity: NWB to the RLE ; Prevalon pressure offloading boot while in bed.   - Wound Care: Q 2 days wound vac change. At this time the other dressings should be changed as well (as follows) Materials: Wound Vac Kit, betadine , mepilex offloading, gauze, kerlix, tape.  Instructions: Wound vac change (typical) Of note -  *I incorporated wound vac to include the overlying suture / incision site given the maceration noted on todays exam- applied betadine  soaked adaptic overlying incision and wound vac sponge on top of this.   Betadine  paint to the eschars (medial ankle / lateral ankle / posterior ankle) + medial TMA incision site. this baseline dressing should be conducted daily. Betadine  paint to pressure injury areas ( areas noted prior to admission) cover with mepilex heel border dressings.   I used gauze with cut out to ensure the tube from the vac does not apply  pressure to the lateral foot. I secured all these dressings with kerlix and loosely placed 4 inch ACE.   Patient awaiting placement at this time.   - ABX: Appreciate assistance with antibiotic stewardship from medicine / pharmacy / ID services.  Anti-infectives (From admission, onward)    Start     Dose/Rate Route Frequency Ordered Stop   03/14/24 0600  ceFAZolin  (ANCEF ) IVPB 2g/100 mL premix        2 g 200 mL/hr over 30 Minutes Intravenous On call to O.R. 03/13/24 1203 03/13/24 1314   03/13/24 2200  Ampicillin -Sulbactam (UNASYN ) 3 g in sodium chloride  0.9 % 100 mL IVPB        3 g 200 mL/hr over 30 Minutes Intravenous Every 8 hours 03/13/24 1850     03/13/24 1500  ceFEPIme  (MAXIPIME ) 2 g in sodium chloride  0.9 % 100 mL IVPB  Status:  Discontinued        2 g 200 mL/hr over 30 Minutes Intravenous Every 8 hours 03/13/24 1401 03/13/24 1845   03/11/24 1452  ceFAZolin  (ANCEF ) IVPB 2g/100 mL premix        2 g 200 mL/hr over 30 Minutes Intravenous 30 min pre-op 03/11/24 1452 03/11/24 1632

## 2024-03-15 NOTE — Progress Notes (Signed)
 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 Pt underwent debridement with podiatry 2 days later. Per podiatry patient is to be nonweightbearing on his right lower extremity.    PT Comments  Pt agreeable to bed level therex this session (see below for details). Pt reporting no pain throughout and able to complete bed mobility at a modified independent level. Pt would continue to benefit from skilled physical therapy services at this time while admitted and after d/c to address the below listed limitations in order to improve overall safety and independence with functional mobility.     If plan is discharge home, recommend the following: Help with stairs or ramp for entrance;Assist for transportation;A lot of help with walking and/or transfers;A lot of help with bathing/dressing/bathroom   Can travel by private vehicle     No  Equipment Recommendations  None recommended by PT    Recommendations for Other Services       Precautions / Restrictions Precautions Precautions: Fall Recall of Precautions/Restrictions: Intact Precaution/Restrictions Comments: wound VAC Restrictions Weight Bearing Restrictions Per Provider Order: Yes RLE Weight Bearing Per Provider Order: Non weight bearing     Mobility  Bed Mobility Overal bed mobility: Modified Independent                  Transfers                   General transfer comment: pt declining OOB mobility but agreeable to therex    Ambulation/Gait                   Stairs             Wheelchair Mobility     Tilt Bed    Modified Rankin (Stroke Patients Only)       Balance Overall  balance assessment: Needs assistance Sitting-balance support: No upper extremity supported Sitting balance-Leahy Scale: Good                                      Communication Communication Communication: No apparent difficulties  Cognition Arousal: Alert Behavior During Therapy: WFL for tasks assessed/performed   PT - Cognitive impairments: No apparent impairments                         Following commands: Intact      Cueing Cueing Techniques: Verbal cues  Exercises General Exercises - Lower Extremity Ankle Circles/Pumps: AROM, Right, 20 reps, Seated Quad Sets: Strengthening, Both, 20 reps, Supine Gluteal Sets: Strengthening, Both, 20 reps, Supine Long Arc Quad: AROM, Strengthening, Right, 20 reps, Seated Hip ABduction/ADduction: AROM, Strengthening, Both, 20 reps, Seated Straight Leg Raises: AROM, Strengthening, Right, 15 reps, Supine Hip Flexion/Marching: AROM, Strengthening, Both, 20 reps, Seated    General Comments        Pertinent Vitals/Pain Pain Assessment Pain Assessment: No/denies pain    Home Living                          Prior Function            PT  Goals (current goals can now be found in the care plan section) Acute Rehab PT Goals PT Goal Formulation: With patient Time For Goal Achievement: 03/28/24 Potential to Achieve Goals: Good Progress towards PT goals: Progressing toward goals    Frequency    Min 3X/week      PT Plan      Co-evaluation              AM-PAC PT 6 Clicks Mobility   Outcome Measure  Help needed turning from your back to your side while in a flat bed without using bedrails?: None Help needed moving from lying on your back to sitting on the side of a flat bed without using bedrails?: None Help needed moving to and from a bed to a chair (including a wheelchair)?: A Lot Help needed standing up from a chair using your arms (e.g., wheelchair or bedside chair)?: A Lot Help  needed to walk in hospital room?: A Little Help needed climbing 3-5 steps with a railing? : A Lot 6 Click Score: 17    End of Session   Activity Tolerance: Patient tolerated treatment well;Patient limited by fatigue Patient left: in bed;with call bell/phone within reach Nurse Communication: Mobility status PT Visit Diagnosis: Unsteadiness on feet (R26.81);Other abnormalities of gait and mobility (R26.89);Difficulty in walking, not elsewhere classified (R26.2)     Time: 8863-8798 PT Time Calculation (min) (ACUTE ONLY): 25 min  Charges:    $Therapeutic Exercise: 8-22 mins $Therapeutic Activity: 8-22 mins PT General Charges $$ ACUTE PT VISIT: 1 Visit                     Delon DELENA KLEIN, DPT  Acute Rehabilitation Services Office [phone]    Delon HERO Saphira Lahmann 03/15/2024, 12:22 PM

## 2024-03-16 DIAGNOSIS — L97909 Non-pressure chronic ulcer of unspecified part of unspecified lower leg with unspecified severity: Secondary | ICD-10-CM | POA: Diagnosis not present

## 2024-03-16 DIAGNOSIS — I70299 Other atherosclerosis of native arteries of extremities, unspecified extremity: Secondary | ICD-10-CM | POA: Diagnosis not present

## 2024-03-16 LAB — BASIC METABOLIC PANEL WITH GFR
Anion gap: 10 (ref 5–15)
BUN: 13 mg/dL (ref 8–23)
CO2: 25 mmol/L (ref 22–32)
Calcium: 8.4 mg/dL — ABNORMAL LOW (ref 8.9–10.3)
Chloride: 106 mmol/L (ref 98–111)
Creatinine, Ser: 1.04 mg/dL (ref 0.61–1.24)
GFR, Estimated: >60 mL/min (ref >60)
Glucose, Bld: 135 mg/dL — ABNORMAL HIGH (ref 70–99)
Potassium: 3.9 mmol/L (ref 3.5–5.1)
Sodium: 141 mmol/L (ref 135–145)

## 2024-03-16 LAB — GLUCOSE, CAPILLARY
Glucose-Capillary: 139 mg/dL — ABNORMAL HIGH (ref 70–99)
Glucose-Capillary: 139 mg/dL — ABNORMAL HIGH (ref 70–99)
Glucose-Capillary: 231 mg/dL — ABNORMAL HIGH (ref 70–99)
Glucose-Capillary: 184 mg/dL — ABNORMAL HIGH (ref 70–99)

## 2024-03-16 LAB — CBC
HCT: 31.7 % — ABNORMAL LOW (ref 39.0–52.0)
Hemoglobin: 10.2 g/dL — ABNORMAL LOW (ref 13.0–17.0)
MCH: 27.4 pg (ref 26.0–34.0)
MCHC: 32.2 g/dL (ref 30.0–36.0)
MCV: 85.2 fL (ref 80.0–100.0)
Platelets: 222 K/uL (ref 150–400)
RBC: 3.72 MIL/uL — ABNORMAL LOW (ref 4.22–5.81)
RDW: 13.4 % (ref 11.5–15.5)
WBC: 6.6 K/uL (ref 4.0–10.5)
nRBC: 0 % (ref 0.0–0.2)

## 2024-03-16 MED ORDER — ORAL CARE MOUTH RINSE: 15.0000 mL | OROMUCOSAL | Status: DC | PRN

## 2024-03-16 MED ORDER — SODIUM CHLORIDE 0.9 % IV SOLN
100.0000 mg | Freq: Two times a day (BID) | INTRAVENOUS | Status: AC
  Administered 2024-03-16 – 2024-03-17 (×4): 100 mg via INTRAVENOUS
  Filled 2024-03-16 (×4): qty 100

## 2024-03-16 NOTE — Progress Notes (Signed)
 3 Days Post-Op   Subjective/Chief Complaint: Doing well. Without complaint. VAC changed yesterday per Podiatry.    Objective: Vital signs in last 24 hours: Temp:  [97.4 F (36.3 C)-98.4 F (36.9 C)] 97.7 F (36.5 C) (11/16 1119) Pulse Rate:  [59-77] 60 (11/16 1119) Resp:  [18-20] 18 (11/16 1119) BP: (106-120)/(67-78) 115/68 (11/16 1119) SpO2:  [97 %-100 %] 98 % (11/16 1119) Last BM Date : 03/16/24  Intake/Output from previous day: 11/15 0701 - 11/16 0700 In: 2496.3 [P.O.:2160; I.V.:36.3; IV Piggyback:300] Out: 200 [Urine:200] Intake/Output this shift: Total I/O In: 240 [P.O.:240] Out: -   General appearance: alert and no distress Cardio: regular rate and rhythm Extremities: RIGHT foot- VAC dressing in place- leg warm  Lab Results:  Recent Labs    03/15/24 0548 03/16/24 0523  WBC 6.8 6.6  HGB 10.2* 10.2*  HCT 30.1* 31.7*  PLT 212 222   BMET Recent Labs    03/15/24 0548 03/16/24 0523  NA 142 141  K 3.7 3.9  CL 109 106  CO2 23 25  GLUCOSE 195* 135*  BUN 14 13  CREATININE 1.17 1.04  CALCIUM 8.6* 8.4*   PT/INR No results for input(s): LABPROT, INR in the last 72 hours. ABG No results for input(s): PHART, HCO3 in the last 72 hours.  Invalid input(s): PCO2, PO2  Studies/Results: No results found.  Anti-infectives: Anti-infectives (From admission, onward)    Start     Dose/Rate Route Frequency Ordered Stop   03/16/24 1130  doxycycline  (VIBRAMYCIN ) 100 mg in sodium chloride  0.9 % 250 mL IVPB        100 mg 125 mL/hr over 120 Minutes Intravenous Every 12 hours 03/16/24 1042     03/14/24 0600  ceFAZolin  (ANCEF ) IVPB 2g/100 mL premix        2 g 200 mL/hr over 30 Minutes Intravenous On call to O.R. 03/13/24 1203 03/13/24 1314   03/13/24 2200  Ampicillin -Sulbactam (UNASYN ) 3 g in sodium chloride  0.9 % 100 mL IVPB  Status:  Discontinued        3 g 200 mL/hr over 30 Minutes Intravenous Every 8 hours 03/13/24 1850 03/16/24 1042   03/13/24 1500   ceFEPIme  (MAXIPIME ) 2 g in sodium chloride  0.9 % 100 mL IVPB  Status:  Discontinued        2 g 200 mL/hr over 30 Minutes Intravenous Every 8 hours 03/13/24 1401 03/13/24 1845   03/11/24 1452  ceFAZolin  (ANCEF ) IVPB 2g/100 mL premix        2 g 200 mL/hr over 30 Minutes Intravenous 30 min pre-op 03/11/24 1452 03/11/24 1632       Assessment/Plan: s/p Procedure(s): IRRIGATION AND DEBRIDEMENT FOOT (Right) APPLICATION, WOUND VAC (Right)  POD #3 s/p RIGHT AT angioplasty  RIGHT VAC per Podiatry and wound care Will need Rehab with consistent wound care- dispo when bed available Eliquis /ASA   LOS: 4 days    Tisa Dakin A 03/16/2024

## 2024-03-16 NOTE — Plan of Care (Signed)
   Problem: Education: Goal: Ability to describe self-care measures that may prevent or decrease complications (Diabetes Survival Skills Education) will improve Outcome: Progressing Goal: Individualized Educational Video(s) Outcome: Progressing   Problem: Coping: Goal: Ability to adjust to condition or change in health will improve Outcome: Progressing   Problem: Fluid Volume: Goal: Ability to maintain a balanced intake and output will improve Outcome: Progressing   Problem: Health Behavior/Discharge Planning: Goal: Ability to identify and utilize available resources and services will improve Outcome: Progressing Goal: Ability to manage health-related needs will improve Outcome: Progressing   Problem: Metabolic: Goal: Ability to maintain appropriate glucose levels will improve Outcome: Progressing   Problem: Nutritional: Goal: Maintenance of adequate nutrition will improve Outcome: Progressing Goal: Progress toward achieving an optimal weight will improve Outcome: Progressing   Problem: Skin Integrity: Goal: Risk for impaired skin integrity will decrease Outcome: Progressing   Problem: Tissue Perfusion: Goal: Adequacy of tissue perfusion will improve Outcome: Progressing   Problem: Education: Goal: Understanding of CV disease, CV risk reduction, and recovery process will improve Outcome: Progressing Goal: Individualized Educational Video(s) Outcome: Progressing   Problem: Activity: Goal: Ability to return to baseline activity level will improve Outcome: Progressing   Problem: Cardiovascular: Goal: Ability to achieve and maintain adequate cardiovascular perfusion will improve Outcome: Progressing Goal: Vascular access site(s) Level 0-1 will be maintained Outcome: Progressing   Problem: Health Behavior/Discharge Planning: Goal: Ability to safely manage health-related needs after discharge will improve Outcome: Progressing   Problem: Education: Goal: Knowledge  of General Education information will improve Description: Including pain rating scale, medication(s)/side effects and non-pharmacologic comfort measures Outcome: Progressing   Problem: Health Behavior/Discharge Planning: Goal: Ability to manage health-related needs will improve Outcome: Progressing   Problem: Clinical Measurements: Goal: Ability to maintain clinical measurements within normal limits will improve Outcome: Progressing Goal: Will remain free from infection Outcome: Progressing Goal: Diagnostic test results will improve Outcome: Progressing Goal: Respiratory complications will improve Outcome: Progressing Goal: Cardiovascular complication will be avoided Outcome: Progressing   Problem: Activity: Goal: Risk for activity intolerance will decrease Outcome: Progressing   Problem: Nutrition: Goal: Adequate nutrition will be maintained Outcome: Progressing   Problem: Coping: Goal: Level of anxiety will decrease Outcome: Progressing   Problem: Elimination: Goal: Will not experience complications related to bowel motility Outcome: Progressing Goal: Will not experience complications related to urinary retention Outcome: Progressing   Problem: Pain Managment: Goal: General experience of comfort will improve and/or be controlled Outcome: Progressing   Problem: Safety: Goal: Ability to remain free from injury will improve Outcome: Progressing   Problem: Skin Integrity: Goal: Risk for impaired skin integrity will decrease Outcome: Progressing

## 2024-03-16 NOTE — Progress Notes (Signed)
 PROGRESS NOTE    Andrew Harding  FMW:[id number] DOB: Sep 19, 1950 DOA: 03/11/2024 PCP: Fernande Ophelia [name], MD   Assessment & Plan:   Principal Problem:   Atherosclerosis of artery of extremity with ulceration (HCC) Active Problems:   Diabetic foot infection (HCC)   Chronic atrial fibrillation with RVR (HCC)   Allergic reaction   Benign essential hypertension   HLD (hyperlipidemia)   CAD (coronary artery disease)   Type 2 diabetes mellitus with stage 3b chronic kidney disease, with long-term current use of insulin  (HCC)  Assessment and Plan:  Atherosclerosis of artery of extremity with ulceration and diabetic right foot infection: s/p excision fifth metatarsal right foot, with application of wound VAC right foot by podiatry. Wound cx growing staph aureus, sens pending. Abxs changed to IV doxy.  Nonweightbearing of right foot and will d/c w/ wound vac as per podiatry   Chronic a. fib: w/ RVR. Continue on metoprolol , eliquis . IV metoprolol  prn.   Allergic reaction: likely to cefepime . W/ itching all over & tachycardia which have resolved. Benadryl  prn    HTN: continue on metoprolol     HLD: continue on statin    Hx of CAD: continue on metoprolol , statin   DM2: well controlled, HbA1c 5.7. Continue on glargine, SSI w/ accuchecks   CKDIIIb: Cr is trending down from day prior. Avoid nephrotoxic meds      DVT prophylaxis: eliquis  Code Status: full  Family Communication:  Disposition Plan: as per primary team   Level of care: Progressive Consultants:  Hospitalist  ID Podiatry   Procedures:   Antimicrobials: doxy   Subjective: Pt c/o fatigue   Objective: Vitals:   03/15/24 1933 03/15/24 2357 03/16/24 0349 03/16/24 0747  BP: 117/76 111/69 120/67 106/78  Pulse: 76 77 (!) 59 61  Resp: 20 18 20 18   Temp: 98.4 F (36.9 C) 98.1 F (36.7 C) 98.3 F (36.8 C) (!) 97.4 F (36.3 C)  TempSrc:      SpO2: 100% 97% 99% 99%  Weight:      Height:        Intake/Output  Summary (Last 24 hours) at 03/16/2024 0921 Last data filed at 03/16/2024 0400 Gross per 24 hour  Intake 2496.29 ml  Output 200 ml  Net 2296.29 ml   Filed Weights   03/11/24 1508 03/13/24 0531 03/13/24 1208  Weight: 82 kg 82 kg 82 kg    Examination:  General exam: appears calm & comfortable  Respiratory system: clear breath sounds b/l  Cardiovascular system: S1 & S2+. No rubs or clicks  Gastrointestinal system: abd is soft, NT, ND & normal bowel sounds  Central nervous system: alert & oriented. Moves all extremities   Psychiatry: judgement and insight appears normal. Appropriate mood and affect    Data Reviewed: I have personally reviewed following labs and imaging studies  CBC: Recent Labs  Lab 03/12/24 0553 03/13/24 0520 03/14/24 0926 03/15/24 0548 03/16/24 0523  WBC 8.7 7.6 9.9 6.8 6.6  HGB 11.2* 11.6* 10.3* 10.2* 10.2*  HCT 34.3* 36.6* 31.0* 30.1* 31.7*  MCV 83.5 85.7 84.0 82.9 85.2  PLT 271 273 218 212 222   Basic Metabolic Panel: Recent Labs  Lab 03/11/24 1505 03/13/24 2022 03/15/24 0548 03/16/24 0523  NA  --  138 142 141  K  --  4.4 3.7 3.9  CL  --  104 109 106  CO2  --  22 23 25   GLUCOSE  --  352* 195* 135*  BUN 17 16 14  13  CREATININE 1.25* 1.37* 1.17 1.04  CALCIUM  --  8.8* 8.6* 8.4*   GFR: Estimated Creatinine Clearance: 72.3 mL/min (by C-G formula based on SCr of 1.04 mg/dL). Liver Function Tests: No results for input(s): AST, ALT, ALKPHOS, BILITOT, PROT, ALBUMIN in the last 168 hours. No results for input(s): LIPASE, AMYLASE in the last 168 hours. No results for input(s): AMMONIA in the last 168 hours. Coagulation Profile: No results for input(s): INR, PROTIME in the last 168 hours. Cardiac Enzymes: No results for input(s): CKTOTAL, CKMB, CKMBINDEX, TROPONINI in the last 168 hours. BNP (last 3 results) No results for input(s): PROBNP in the last 8760 hours. HbA1C: No results for input(s): HGBA1C in the  last 72 hours. CBG: Recent Labs  Lab 03/15/24 0837 03/15/24 1237 03/15/24 1628 03/15/24 2135 03/16/24 0830  GLUCAP 166* 149* 192* 227* 139*   Lipid Profile: No results for input(s): CHOL, HDL, LDLCALC, TRIG, CHOLHDL, LDLDIRECT in the last 72 hours. Thyroid Function Tests: No results for input(s): TSH, T4TOTAL, FREET4, T3FREE, THYROIDAB in the last 72 hours. Anemia Panel: No results for input(s): VITAMINB12, FOLATE, FERRITIN, TIBC, IRON , RETICCTPCT in the last 72 hours. Sepsis Labs: No results for input(s): PROCALCITON, LATICACIDVEN in the last 168 hours.  Recent Results (from the past 240 hours)  Aerobic/Anaerobic Culture w Gram Stain (surgical/deep wound)     Status: None (Preliminary result)   Collection Time: 03/13/24  1:00 PM   Specimen: Path fluid; Body Fluid  Result Value Ref Range Status   Specimen Description ABSCESS  Final   Special Requests RIGHT FIFTH METATARSAL  Final   Gram Stain   Final    NO WBC SEEN NO ORGANISMS SEEN Performed at [facility], [address], [place], [place] [address]    Culture   Final    FEW STAPHYLOCOCCUS AUREUS CULTURE REINCUBATED FOR BETTER GROWTH NO ANAEROBES ISOLATED; CULTURE IN PROGRESS FOR 5 DAYS    Report Status PENDING  Incomplete         Radiology Studies: No results found.       Scheduled Meds:  apixaban   5 mg Oral BID   aspirin  EC  81 mg Oral Daily   gabapentin   300 mg Oral TID   insulin  aspart  0-15 Units Subcutaneous TID WC   insulin  aspart  0-5 Units Subcutaneous QHS   insulin  glargine-yfgn  14 Units Subcutaneous Q2200   metoprolol  tartrate  12.5 mg Oral BID   rosuvastatin  20 mg Oral Daily   sodium chloride  flush  3 mL Intravenous Q12H   traZODone   50 mg Oral QHS   Continuous Infusions:  ampicillin -sulbactam (UNASYN ) IV 3 g (03/16/24 0535)     LOS: 4 days      Anthony CHRISTELLA Pouch, MD [facility] Pager [phone]  If 7PM-7AM, please  contact night-coverage [email or web address] 03/16/2024, 9:21 AM

## 2024-03-17 DIAGNOSIS — L97909 Non-pressure chronic ulcer of unspecified part of unspecified lower leg with unspecified severity: Secondary | ICD-10-CM | POA: Diagnosis not present

## 2024-03-17 DIAGNOSIS — I70239 Atherosclerosis of native arteries of right leg with ulceration of unspecified site: Secondary | ICD-10-CM | POA: Diagnosis not present

## 2024-03-17 DIAGNOSIS — Z9889 Other specified postprocedural states: Secondary | ICD-10-CM | POA: Diagnosis not present

## 2024-03-17 DIAGNOSIS — Z7982 Long term (current) use of aspirin: Secondary | ICD-10-CM | POA: Diagnosis not present

## 2024-03-17 DIAGNOSIS — L97919 Non-pressure chronic ulcer of unspecified part of right lower leg with unspecified severity: Secondary | ICD-10-CM | POA: Diagnosis not present

## 2024-03-17 DIAGNOSIS — I70261 Atherosclerosis of native arteries of extremities with gangrene, right leg: Secondary | ICD-10-CM | POA: Diagnosis not present

## 2024-03-17 DIAGNOSIS — I70299 Other atherosclerosis of native arteries of extremities, unspecified extremity: Secondary | ICD-10-CM | POA: Diagnosis not present

## 2024-03-17 DIAGNOSIS — T361X5A Adverse effect of cephalosporins and other beta-lactam antibiotics, initial encounter: Secondary | ICD-10-CM | POA: Diagnosis not present

## 2024-03-17 DIAGNOSIS — B9561 Methicillin susceptible Staphylococcus aureus infection as the cause of diseases classified elsewhere: Secondary | ICD-10-CM | POA: Diagnosis not present

## 2024-03-17 DIAGNOSIS — E11628 Type 2 diabetes mellitus with other skin complications: Secondary | ICD-10-CM | POA: Diagnosis not present

## 2024-03-17 LAB — BASIC METABOLIC PANEL WITH GFR
Anion gap: 7 (ref 5–15)
BUN: 11 mg/dL (ref 8–23)
CO2: 27 mmol/L (ref 22–32)
Calcium: 8.5 mg/dL — ABNORMAL LOW (ref 8.9–10.3)
Chloride: 106 mmol/L (ref 98–111)
Creatinine, Ser: 1.08 mg/dL (ref 0.61–1.24)
GFR, Estimated: >60 mL/min (ref >60)
Glucose, Bld: 188 mg/dL — ABNORMAL HIGH (ref 70–99)
Potassium: 4.1 mmol/L (ref 3.5–5.1)
Sodium: 139 mmol/L (ref 135–145)

## 2024-03-17 LAB — GLUCOSE, CAPILLARY
Glucose-Capillary: 217 mg/dL — ABNORMAL HIGH (ref 70–99)
Glucose-Capillary: 194 mg/dL — ABNORMAL HIGH (ref 70–99)
Glucose-Capillary: 225 mg/dL — ABNORMAL HIGH (ref 70–99)
Glucose-Capillary: 178 mg/dL — ABNORMAL HIGH (ref 70–99)

## 2024-03-17 LAB — SURGICAL PATHOLOGY

## 2024-03-17 MED ORDER — SODIUM CHLORIDE 0.9 % IV SOLN
3.0000 g | Freq: Four times a day (QID) | INTRAVENOUS | Status: AC
  Administered 2024-03-18 – 2024-03-19 (×4): 3 g via INTRAVENOUS
  Filled 2024-03-17 (×4): qty 8

## 2024-03-17 NOTE — Progress Notes (Signed)
 Physical Therapy Treatment Patient Details Name: Andrew Harding MRN: [id number] DOB: 1950-12-28 Today's Date: 03/17/2024   History of Present Illness Andrew Harding is a 73yoM who comes to [facility] on 03/11/24 for vascular surgery to address atherosclerotic occlusive disease bilateral lower extremities with ulceration and nonhealing wounds of the right lower extremity. Pt underwent debridement with podiatry 2 days later. Per podiatry patient is to be nonweightbearing on his right lower extremity.    PT Comments  Patient is cooperative and motivated to participate. Transfer training continued today. Increased assistance required for standing compared to last session. Patient was able to stand x 3 bouts while maintaining NWB of RLE. Reinforcement of technique to facilitate independence and weight bearing precautions with standing. He was able to take a few side steps with assistance using rolling walker. Recommend to continue PT maximize independence. Consider rehabilitation < 3 hours/day after this hospital stay.    If plan is discharge home, recommend the following: Help with stairs or ramp for entrance;Assist for transportation;A lot of help with walking and/or transfers;A lot of help with bathing/dressing/bathroom   Can travel by private vehicle     No  Equipment Recommendations  None recommended by PT    Recommendations for Other Services       Precautions / Restrictions Precautions Precautions: Fall Recall of Precautions/Restrictions: Intact Precaution/Restrictions Comments: wound VAC Restrictions RLE Weight Bearing Per Provider Order: Non weight bearing     Mobility  Bed Mobility Overal bed mobility: Modified Independent                  Transfers Overall transfer level: Needs assistance Equipment used: Rolling walker (2 wheels) Transfers: Sit to/from Stand Sit to Stand: Mod assist, +2 physical assistance           General transfer comment: 3 standing bouts performed from  elevated bed height. significant assistance required for first stand. patient needs cues for hand placement, RLE positioning. patient has tendency to push from rolling walker to stand rather than the bed. bed height elevated for safety    Ambulation/Gait             Pre-gait activities: patient able to take 3 small side step to the right and one step backward using rolling walker with Min A for safety. he is able to maintain NWB of RLE with standing activity, occasional reinforcement of technique using UE strength for off loading prosthesis General Gait Details: limited standing tolerance for progression of walking   Stairs             Wheelchair Mobility     Tilt Bed    Modified Rankin (Stroke Patients Only)       Balance Overall balance assessment: Needs assistance Sitting-balance support: No upper extremity supported Sitting balance-Leahy Scale: Good     Standing balance support: Bilateral upper extremity supported Standing balance-Leahy Scale: Fair                              Hotel Manager: No apparent difficulties  Cognition Arousal: Alert Behavior During Therapy: WFL for tasks assessed/performed   PT - Cognitive impairments: No apparent impairments                         Following commands: Intact      Cueing Cueing Techniques: Verbal cues  Exercises      General Comments General comments (skin integrity, edema, etc.):  patient is able to don his prosthesis from bed level with set-up.      Pertinent Vitals/Pain Pain Assessment Pain Assessment: No/denies pain    Home Living                          Prior Function            PT Goals (current goals can now be found in the care plan section) Acute Rehab PT Goals Patient Stated Goal: be adherent to RLE NWB and allow for healing PT Goal Formulation: With patient Time For Goal Achievement: 03/28/24 Potential to Achieve Goals:  Good Progress towards PT goals: Progressing toward goals    Frequency    Min 3X/week      PT Plan      Co-evaluation   Reason for Co-Treatment: To address functional/ADL transfers PT goals addressed during session: Mobility/safety with mobility        AM-PAC PT 6 Clicks Mobility   Outcome Measure  Help needed turning from your back to your side while in a flat bed without using bedrails?: None Help needed moving from lying on your back to sitting on the side of a flat bed without using bedrails?: None Help needed moving to and from a bed to a chair (including a wheelchair)?: A Lot Help needed standing up from a chair using your arms (e.g., wheelchair or bedside chair)?: A Lot Help needed to walk in hospital room?: A Little Help needed climbing 3-5 steps with a railing? : A Lot 6 Click Score: 17    End of Session         PT Visit Diagnosis: Unsteadiness on feet (R26.81);Other abnormalities of gait and mobility (R26.89);Difficulty in walking, not elsewhere classified (R26.2)     Time: 8942-8875 PT Time Calculation (min) (ACUTE ONLY): 27 min  Charges:    $Therapeutic Activity: 8-22 mins PT General Charges $$ ACUTE PT VISIT: 1 Visit                     Randine Essex, PT, MPT    Randine LULLA Essex 03/17/2024, 12:49 PM

## 2024-03-17 NOTE — Progress Notes (Signed)
 Pt was concerned about dressing changes being due. MD was made aware and [name] DPM. [name] is now at bedside performing dressing and wound vac change.

## 2024-03-17 NOTE — Progress Notes (Signed)
 PROGRESS NOTE    Andrew Harding  FMW:[id number] DOB: 1950/10/15 DOA: 03/11/2024 PCP: Fernande Ophelia [name], MD   Assessment & Plan:   Principal Problem:   Atherosclerosis of artery of extremity with ulceration (HCC) Active Problems:   Diabetic foot infection (HCC)   Chronic atrial fibrillation with RVR (HCC)   Allergic reaction   Benign essential hypertension   HLD (hyperlipidemia)   CAD (coronary artery disease)   Type 2 diabetes mellitus with stage 3b chronic kidney disease, with long-term current use of insulin  (HCC)  Assessment and Plan:  Atherosclerosis of artery of extremity with ulceration and diabetic right foot infection: s/p excision fifth metatarsal right foot, with application of wound VAC right foot by podiatry. Wound cx growing staph aureus. Abxs as per ID. Nonweightbearing of right foot and will d/c w/ wound vac as per podiatry   Chronic a. fib: w/ RVR. Continue on eliquis , metoprolol . IV metoprolol  prn.   Allergic reaction: likely to cefepime . W/ itching all over & tachycardia which have resolved. Benadryl  prn    HTN: continue on metoprolol     HLD: continue on statin    Hx of CAD: continue on metoprolol , statin    DM2: well controlled, HbA1c 5.7. Continue on glargine, SSI w/ accuchecks   CKDIIIb: Cr is labile. Avoid nephrotoxic meds       DVT prophylaxis: eliquis  Code Status: full  Family Communication:  Disposition Plan: as per primary team   Level of care: Progressive Consultants:  Hospitalist  ID Podiatry   Procedures:   Antimicrobials: doxy   Subjective: Pt c/o fatigue   Objective: Vitals:   03/16/24 2026 03/17/24 0000 03/17/24 0434 03/17/24 0726  BP: 126/86 (!) 142/90 (!) 145/78 117/73  Pulse: 60 81 66 77  Resp: 20 20 16 18   Temp: 98.3 F (36.8 C) 97.7 F (36.5 C) 97.8 F (36.6 C) 97.7 F (36.5 C)  TempSrc:      SpO2: 100% 100% 100% 99%  Weight:      Height:        Intake/Output Summary (Last 24 hours) at 03/17/2024 0922 Last  data filed at 03/17/2024 0720 Gross per 24 hour  Intake 1570 ml  Output 1100 ml  Net 470 ml   Filed Weights   03/11/24 1508 03/13/24 0531 03/13/24 1208  Weight: 82 kg 82 kg 82 kg    Examination:  General exam: appears comfortable   Respiratory system: clear breath sounds b/l  Cardiovascular system: S1/S2+. No rubs or clicks Gastrointestinal system: abd is soft, NT, ND & hypoactive bowel sounds  Central nervous system: alert & oriented. Moves all extremities    Psychiatry: judgement and insight appears at baseline. Appropriate mood and affect    Data Reviewed: I have personally reviewed following labs and imaging studies  CBC: Recent Labs  Lab 03/12/24 0553 03/13/24 0520 03/14/24 0926 03/15/24 0548 03/16/24 0523  WBC 8.7 7.6 9.9 6.8 6.6  HGB 11.2* 11.6* 10.3* 10.2* 10.2*  HCT 34.3* 36.6* 31.0* 30.1* 31.7*  MCV 83.5 85.7 84.0 82.9 85.2  PLT 271 273 218 212 222   Basic Metabolic Panel: Recent Labs  Lab 03/11/24 1505 03/13/24 2022 03/15/24 0548 03/16/24 0523 03/17/24 0455  NA  --  138 142 141 139  K  --  4.4 3.7 3.9 4.1  CL  --  104 109 106 106  CO2  --  22 23 25 27   GLUCOSE  --  352* 195* 135* 188*  BUN 17 16 14 13  11  CREATININE 1.25* 1.37* 1.17 1.04 1.08  CALCIUM  --  8.8* 8.6* 8.4* 8.5*   GFR: Estimated Creatinine Clearance: 69.6 mL/min (by C-G formula based on SCr of 1.08 mg/dL). Liver Function Tests: No results for input(s): AST, ALT, ALKPHOS, BILITOT, PROT, ALBUMIN in the last 168 hours. No results for input(s): LIPASE, AMYLASE in the last 168 hours. No results for input(s): AMMONIA in the last 168 hours. Coagulation Profile: No results for input(s): INR, PROTIME in the last 168 hours. Cardiac Enzymes: No results for input(s): CKTOTAL, CKMB, CKMBINDEX, TROPONINI in the last 168 hours. BNP (last 3 results) No results for input(s): PROBNP in the last 8760 hours. HbA1C: No results for input(s): HGBA1C in the last 72  hours. CBG: Recent Labs  Lab 03/16/24 0830 03/16/24 1207 03/16/24 1638 03/16/24 2116 03/17/24 0847  GLUCAP 139* 139* 231* 184* 217*   Lipid Profile: No results for input(s): CHOL, HDL, LDLCALC, TRIG, CHOLHDL, LDLDIRECT in the last 72 hours. Thyroid Function Tests: No results for input(s): TSH, T4TOTAL, FREET4, T3FREE, THYROIDAB in the last 72 hours. Anemia Panel: No results for input(s): VITAMINB12, FOLATE, FERRITIN, TIBC, IRON , RETICCTPCT in the last 72 hours. Sepsis Labs: No results for input(s): PROCALCITON, LATICACIDVEN in the last 168 hours.  Recent Results (from the past 240 hours)  Aerobic/Anaerobic Culture w Gram Stain (surgical/deep wound)     Status: None (Preliminary result)   Collection Time: 03/13/24  1:00 PM   Specimen: Path fluid; Body Fluid  Result Value Ref Range Status   Specimen Description ABSCESS  Final   Special Requests RIGHT FIFTH METATARSAL  Final   Gram Stain   Final    NO WBC SEEN NO ORGANISMS SEEN Performed at [facility], [address] [place], [place] [address]    Culture   Final    FEW STAPHYLOCOCCUS AUREUS NO ANAEROBES ISOLATED; CULTURE IN PROGRESS FOR 5 DAYS    Report Status PENDING  Incomplete   Organism ID, Bacteria STAPHYLOCOCCUS AUREUS  Final      Susceptibility   Staphylococcus aureus - MIC*    CIPROFLOXACIN  <=0.5 SENSITIVE Sensitive     ERYTHROMYCIN RESISTANT Resistant     GENTAMICIN  <=0.5 SENSITIVE Sensitive     OXACILLIN 0.5 SENSITIVE Sensitive     TETRACYCLINE <=1 SENSITIVE Sensitive     VANCOMYCIN  <=0.5 SENSITIVE Sensitive     TRIMETH /SULFA  <=10 SENSITIVE Sensitive     CLINDAMYCIN RESISTANT Resistant     RIFAMPIN <=0.5 SENSITIVE Sensitive     Inducible Clindamycin POSITIVE Resistant     LINEZOLID 2 SENSITIVE Sensitive     * FEW STAPHYLOCOCCUS AUREUS         Radiology Studies: No results found.       Scheduled Meds:  apixaban   5 mg Oral BID   aspirin  EC  81 mg  Oral Daily   gabapentin   300 mg Oral TID   insulin  aspart  0-15 Units Subcutaneous TID WC   insulin  aspart  0-5 Units Subcutaneous QHS   insulin  glargine-yfgn  14 Units Subcutaneous Q2200   metoprolol  tartrate  12.5 mg Oral BID   rosuvastatin  20 mg Oral Daily   sodium chloride  flush  3 mL Intravenous Q12H   traZODone   50 mg Oral QHS   Continuous Infusions:  doxycycline  (VIBRAMYCIN ) IV 100 mg (03/16/24 2153)     LOS: 5 days      Anthony CHRISTELLA Pouch, MD [facility] Pager [phone]  If 7PM-7AM, please contact night-coverage [email or web address] 03/17/2024, 9:22 AM

## 2024-03-17 NOTE — Progress Notes (Signed)
 Date of Admission:  03/11/2024     ID: Andrew Harding is a 73 y.o. male Principal Problem:   Atherosclerosis of artery of extremity with ulceration (HCC) Active Problems:   Type 2 diabetes mellitus with stage 3b chronic kidney disease, with long-term current use of insulin  (HCC)   Benign essential hypertension   Diabetic foot infection (HCC)   Chronic atrial fibrillation with RVR (HCC)   Allergic reaction   HLD (hyperlipidemia)   CAD (coronary artery disease)    Subjective: Doing fine No specific complaints  Medications:   apixaban   5 mg Oral BID   aspirin  EC  81 mg Oral Daily   gabapentin   300 mg Oral TID   insulin  aspart  0-15 Units Subcutaneous TID WC   insulin  aspart  0-5 Units Subcutaneous QHS   insulin  glargine-yfgn  14 Units Subcutaneous Q2200   metoprolol  tartrate  12.5 mg Oral BID   rosuvastatin  20 mg Oral Daily   sodium chloride  flush  3 mL Intravenous Q12H   traZODone   50 mg Oral QHS    Objective: Vital signs in last 24 hours: Patient Vitals for the past 24 hrs:  BP Temp Pulse Resp SpO2  03/17/24 1126 127/73 97.7 F (36.5 C) 73 18 98 %  03/17/24 0726 117/73 97.7 F (36.5 C) 77 18 99 %  03/17/24 0434 (!) 145/78 97.8 F (36.6 C) 66 16 100 %  03/17/24 0000 (!) 142/90 97.7 F (36.5 C) 81 20 100 %  03/16/24 2026 126/86 98.3 F (36.8 C) 60 20 100 %  03/16/24 1536 (!) 101/58 98 F (36.7 C) 82 18 98 %       PHYSICAL EXAM:  General: Alert, cooperative, no distress, appears stated age.  Back: No CVA tenderness. Lungs: Clear to auscultation bilaterally. No Wheezing or Rhonchi. No rales. Heart: Regular rate and rhythm, no murmur, rub or gallop. Abdomen: Soft, non-tender,not distended. Bowel sounds normal. No masses Extremities: dressing not removed on the rt foot PICTURE reviewed      Left BKA   Skin: No rashes or lesions. Or bruising Lymph: Cervical, supraclavicular normal. Neurologic: Grossly non-focal  Lab Results    Latest Ref Rng & Units  03/16/2024    5:23 AM 03/15/2024    5:48 AM 03/14/2024    9:26 AM  CBC  WBC 4.0 - 10.5 K/uL 6.6  6.8  9.9   Hemoglobin 13.0 - 17.0 g/dL 89.7  89.7  89.6   Hematocrit 39.0 - 52.0 % 31.7  30.1  31.0   Platelets 150 - 400 K/uL 222  212  218        Latest Ref Rng & Units 03/17/2024    4:55 AM 03/16/2024    5:23 AM 03/15/2024    5:48 AM  CMP  Glucose 70 - 99 mg/dL 811  864  804   BUN 8 - 23 mg/dL 11  13  14    Creatinine 0.61 - 1.24 mg/dL 8.91  8.95  8.82   Sodium 135 - 145 mmol/L 139  141  142   Potassium 3.5 - 5.1 mmol/L 4.1  3.9  3.7   Chloride 98 - 111 mmol/L 106  106  109   CO2 22 - 32 mmol/L 27  25  23    Calcium 8.9 - 10.3 mg/dL 8.5  8.4  8.6       Microbiology 03/13/24- Culture MSSA 03/06/24 WC from podiatry office is MSSA  Studies/Results: No results found.   Assessment/Plan: 73 year old male  with history of diabetes, peripheral neuropathy, PAD, left BKA, CAD status post CABG, hypertension, A-fib on Eliquis , presents with an ongoing ischemic wound on the lateral margin of the right TMA Since May 25 patient has been having right foot infection. Initially he had right great toe amputation followed by TMA on 11/02/2023.  He was treated with 6 weeks of IV daptomycin  for MRSA and Enterococcus faecalis osteomyelitis. The wound healing had been complicated by superficial necrosis at the surgical site and patient has had multiple vascular procedures of the right lower extremity On 12/21/2023 he had further debridement of the right TMA and the organisms that time was Enterobacter,pseudeschericia, and granulicutella. He received levaquin and augmentin  9/11-9/17 admitted and further debridement of the TMA.  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  In the culture was Enterobacter and Prevotella.  He was discharged on ertapenem  IV and completed 6 weeks on 02/21/2024 and PICC line was removed.  He was off antibiotics until 03/06/2024 when he was restarted on Levaquin and Augmentin  after  a culture was taken by Dr. Ashley in his office   He is now admitted for further debridement of an eschar on the lateral margin as well as for vascular procedure     New ischemic eschar on the lateral margin of the right foot Recurrent infection on the lateral end of the surgical site Patient underwent irrigation and debridement and also excision of the fifth metatarsal.  Application of wound VAC Patient was on Unasyn  as the culture on 03/06/2024 grew Staph aureus but changed to DOXY over the weekend As it is MSSA wll DC doxy and restart Unasyn  Pathology margin is inactive osteo . Will discuss with pathologist regarding the articular surface-if IV on discharge can do cefazolin - will discuss with podiatrist   PAD Status post balloon angioplasty of the right anterior tibial artery   Cefepime  causing tachycardia and burning and itching  reaction.  Discontinued and Benadryl  given   Diabetes mellitus On insulin      CAD status post CABG on Crestor metoprolol   Discussed with the patient

## 2024-03-17 NOTE — Plan of Care (Signed)
  Problem: Fluid Volume: Goal: Ability to maintain a balanced intake and output will improve Outcome: Progressing   Problem: Nutritional: Goal: Maintenance of adequate nutrition will improve Outcome: Progressing   Problem: Clinical Measurements: Goal: Cardiovascular complication will be avoided Outcome: Progressing   Problem: Elimination: Goal: Will not experience complications related to bowel motility Outcome: Progressing   Problem: Safety: Goal: Ability to remain free from injury will improve Outcome: Progressing

## 2024-03-17 NOTE — Inpatient Diabetes Management (Signed)
 Inpatient Diabetes Program Recommendations  AACE/ADA: New Consensus Statement on Inpatient Glycemic Control (2015)  Target Ranges:  Prepandial:   less than 140 mg/dL      Peak postprandial:   less than 180 mg/dL (1-2 hours)      Critically ill patients:  140 - 180 mg/dL   Lab Results  Component Value Date   GLUCAP 217 (H) 03/17/2024   HGBA1C 5.7 (H) 11/01/2023    Review of Glycemic Control  Latest Reference Range & Units 03/16/24 08:30 03/16/24 12:07 03/16/24 16:38 03/16/24 21:16 03/17/24 08:47  Glucose-Capillary 70 - 99 mg/dL 860 (H) 860 (H) 768 (H) 184 (H) 217 (H)   Diabetes history: DM 2 Outpatient Diabetes medications:  Lantus  28 units q HS Metformin  1000 mg bid Current orders for Inpatient glycemic control:  Semglee  14 units daily Novolog  0-15 units tid with meals and HS Inpatient Diabetes Program Recommendations:    Consider increasing Semglee  to 20 units daily.   Thanks,  Randall Bullocks, RN, BC-ADM Inpatient Diabetes Coordinator Pager [phone]  (8a-5p)

## 2024-03-17 NOTE — Progress Notes (Signed)
 Occupational Therapy Treatment Patient Details Name: Andrew Harding MRN: [id number] DOB: Mar 26, 1951 Today's Date: 03/17/2024   History of present illness Elston Aldape is a 73yoM who [facility] on [facility]24 for vascular surgery to address atherosclerotic occlusive disease bilateral lower extremities with ulceration and nonhealing wounds of the right lower extremity. Pt underwent debridement with podiatry 2 days later. Per podiatry patient is to be nonweightbearing on his right lower extremity.   OT comments  Pt seen for OT and PT co-tx to optimize safety with ADL/mobility. Pt required initial MOD A +2 to stand from elevated EOB with VC for hand placement improving to MOD A +2 safety on 3rd attempt with improved hand placement noted. Pt edu in home/routines modifications for LB ADL and activity pacing. Pt verbalized understanding. Progressing well.       If plan is discharge home, recommend the following:  Assistance with cooking/housework;A lot of help with walking and/or transfers;A lot of help with bathing/dressing/bathroom;Assist for transportation   Equipment Recommendations  Other (comment) (defer)    Recommendations for Other Services      Precautions / Restrictions Precautions Precautions: Fall Recall of Precautions/Restrictions: Intact Precaution/Restrictions Comments: wound VAC Restrictions Weight Bearing Restrictions Per Provider Order: Yes RLE Weight Bearing Per Provider Order: Non weight bearing       Mobility Bed Mobility Overal bed mobility: Modified Independent                  Transfers Overall transfer level: Needs assistance Equipment used: Rolling walker (2 wheels) Transfers: Sit to/from Stand Sit to Stand: Mod assist, +2 physical assistance           General transfer comment: 3 standing bouts performed from elevated bed height. significant assistance required for first stand. patient needs cues for hand placement, RLE positioning. patient has  tendency to push from rolling walker to stand rather than the bed. bed height elevated for safety     Balance Overall balance assessment: Needs assistance Sitting-balance support: No upper extremity supported Sitting balance-Leahy Scale: Good     Standing balance support: Bilateral upper extremity supported Standing balance-Leahy Scale: Fair                             ADL either performed or assessed with clinical judgement   ADL Overall ADL's : Needs assistance/impaired                     Lower Body Dressing: Bed level;Sitting/lateral leans;Minimal assistance Lower Body Dressing Details (indicate cue type and reason): Pt indep with donning LLE prosthetic while supine in bed, anticipate PRN MIN A for LBd otherwise                    Extremity/Trunk Assessment              Vision       Perception     Praxis     Communication Communication Communication: No apparent difficulties   Cognition Arousal: Alert Behavior During Therapy: WFL for tasks assessed/performed Cognition: No apparent impairments                               Following commands: Intact        Cueing   Cueing Techniques: Verbal cues  Exercises Other Exercises Other Exercises: Pt edu in home/routines modifications for LB ADL and activity pacing  Shoulder Instructions       General Comments patient is able to don his prosthesis from bed level with set-up.    Pertinent Vitals/ Pain       Pain Assessment Pain Assessment: No/denies pain  Home Living                                          Prior Functioning/Environment              Frequency  Min 2X/week        Progress Toward Goals  OT Goals(current goals can now be found in the care plan section)  Progress towards OT goals: Progressing toward goals  Acute Rehab OT Goals Patient Stated Goal: return to rehab OT Goal Formulation: With patient Time For Goal  Achievement: 03/29/24 Potential to Achieve Goals: Good  Plan      Co-evaluation    PT/OT/SLP Co-Evaluation/Treatment: Yes Reason for Co-Treatment: To address functional/ADL transfers PT goals addressed during session: Mobility/safety with mobility OT goals addressed during session: Proper use of Adaptive equipment and DME;ADL's and self-care      AM-PAC OT 6 Clicks Daily Activity     Outcome Measure   Help from another person eating meals?: None Help from another person taking care of personal grooming?: None Help from another person toileting, which includes using toliet, bedpan, or urinal?: A Lot Help from another person bathing (including washing, rinsing, drying)?: A Lot Help from another person to put on and taking off regular upper body clothing?: A Little Help from another person to put on and taking off regular lower body clothing?: A Lot 6 Click Score: 17    End of Session Equipment Utilized During Treatment: Rolling walker (2 wheels)  OT Visit Diagnosis: Muscle weakness (generalized) (M62.81)   Activity Tolerance Patient tolerated treatment well   Patient Left in bed;with call bell/phone within reach;with bed alarm set   Nurse Communication          Time: 8942-8876 OT Time Calculation (min): 26 min  Charges: OT General Charges $OT Visit: 1 Visit OT Treatments $Therapeutic Activity: 8-22 mins  Warren SAUNDERS., MPH, MS, OTR/L ascom [phone] 03/17/24, 1:31 PM

## 2024-03-17 NOTE — Progress Notes (Signed)
 Progress Note    03/17/2024 8:16 AM 4 Days Post-Op  Subjective:  Andrew Harding is a 73 yo male now POD #6 from:    Date of Surgery: 03/11/2024   Surgeon:  Cordella Shawl   Pre-operative Diagnosis: Atherosclerotic occlusive disease bilateral lower extremities with ulceration and nonhealing wounds of the right lower extremity   Post-operative diagnosis:  Same   Procedure(s) Performed:             1.  Introduction catheter into right lower extremity 3rd order catheter placement with additional third order catheter placement             2.  Contrast injection right lower extremity for distal runoff with additional 3rd order             3.  Percutaneous transluminal angioplasty right anterior tibial artery                          4.  Star close closure left common femoral arteriotomy   Operative note: 03/13/2024  POD #4    Surgeon:Justin Ashley     Assistant: None     Preop diagnosis: Necrotic ulcer lateral right foot     Postop diagnosis: Same     Procedure: 1 excision fifth metatarsal right foot 2.  Application of wound VAC right foot 3.  Intraoperative fluoroscopy without assistance of radiologist Patient is recovering as expected.  Patient had a reaction to an IV antibiotic last night.  He was given Benadryl  IV and all the symptoms resolved 20 minutes later.  No complications to note.  No complications overnight. Patient endorses leg feels much better.  Per podiatry patient is to be nonweightbearing on his right lower extremity.  Vitals are remained stable.    Vitals:   03/17/24 0434 03/17/24 0726  BP: (!) 145/78 117/73  Pulse: 66 77  Resp: 16 18  Temp: 97.8 F (36.6 C) 97.7 F (36.5 C)  SpO2: 100% 99%   Physical Exam: Cardiac:  Irregular Hear Rate Hx of Atrial Fibrillation  Lungs: Nonlabored breathing, clear on auscultation throughout, no rales rhonchi or wheezing. Incisions: Left groin puncture site with dressing clean dry and intact.  No hematoma seroma  noted. Extremities: Right lower extremity with Doppler pulses only.  Extremities warm to touch. Wound vac in place and working well. Abdomen: Positive bowel sounds throughout, soft, nontender nondistended. Neurologic: Alert and oriented x 3, answers all questions follows commands appropriately.  CBC    Component Value Date/Time   WBC 6.6 03/16/2024 0523   RBC 3.72 (L) 03/16/2024 0523   HGB 10.2 (L) 03/16/2024 0523   HCT 31.7 (L) 03/16/2024 0523   PLT 222 03/16/2024 0523   MCV 85.2 03/16/2024 0523   MCH 27.4 03/16/2024 0523   MCHC 32.2 03/16/2024 0523   RDW 13.4 03/16/2024 0523   LYMPHSABS 1.2 01/10/2024 1218   MONOABS 0.4 01/10/2024 1218   EOSABS 0.3 01/10/2024 1218   BASOSABS 0.1 01/10/2024 1218    BMET    Component Value Date/Time   NA 139 03/17/2024 0455   K 4.1 03/17/2024 0455   CL 106 03/17/2024 0455   CO2 27 03/17/2024 0455   GLUCOSE 188 (H) 03/17/2024 0455   GLUCOSE 402 (H) 09/17/2012 1417   BUN 11 03/17/2024 0455   CREATININE 1.08 03/17/2024 0455   CALCIUM 8.5 (L) 03/17/2024 0455   GFRNONAA >60 03/17/2024 0455   GFRAA 58 (L) 06/02/2015 0656    INR  Component Value Date/Time   INR 1.2 01/12/2024 1433     Intake/Output Summary (Last 24 hours) at 03/17/2024 0816 Last data filed at 03/17/2024 0720 Gross per 24 hour  Intake 1570 ml  Output 1100 ml  Net 470 ml     Assessment/Plan:  73 y.o. male is s/p See ABOVE  4 Days Post-Op   PLAN RIGHT foot VAC and care per Podiatry Continue Eliquis /ASA Continue to work with PT/OT OK from Vascular standpoint for discharge to Rehab  DVT prophylaxis:  Eliquis  and ASA   [name] [facility] 03/17/2024 8:16 AM

## 2024-03-17 NOTE — Plan of Care (Signed)
   Problem: Education: Goal: Ability to describe self-care measures that may prevent or decrease complications (Diabetes Survival Skills Education) will improve Outcome: Progressing Goal: Individualized Educational Video(s) Outcome: Progressing   Problem: Coping: Goal: Ability to adjust to condition or change in health will improve Outcome: Progressing   Problem: Fluid Volume: Goal: Ability to maintain a balanced intake and output will improve Outcome: Progressing   Problem: Health Behavior/Discharge Planning: Goal: Ability to identify and utilize available resources and services will improve Outcome: Progressing Goal: Ability to manage health-related needs will improve Outcome: Progressing   Problem: Metabolic: Goal: Ability to maintain appropriate glucose levels will improve Outcome: Progressing   Problem: Nutritional: Goal: Maintenance of adequate nutrition will improve Outcome: Progressing Goal: Progress toward achieving an optimal weight will improve Outcome: Progressing   Problem: Skin Integrity: Goal: Risk for impaired skin integrity will decrease Outcome: Progressing   Problem: Tissue Perfusion: Goal: Adequacy of tissue perfusion will improve Outcome: Progressing   Problem: Education: Goal: Understanding of CV disease, CV risk reduction, and recovery process will improve Outcome: Progressing Goal: Individualized Educational Video(s) Outcome: Progressing   Problem: Activity: Goal: Ability to return to baseline activity level will improve Outcome: Progressing   Problem: Cardiovascular: Goal: Ability to achieve and maintain adequate cardiovascular perfusion will improve Outcome: Progressing Goal: Vascular access site(s) Level 0-1 will be maintained Outcome: Progressing   Problem: Health Behavior/Discharge Planning: Goal: Ability to safely manage health-related needs after discharge will improve Outcome: Progressing   Problem: Education: Goal: Knowledge  of General Education information will improve Description: Including pain rating scale, medication(s)/side effects and non-pharmacologic comfort measures Outcome: Progressing   Problem: Health Behavior/Discharge Planning: Goal: Ability to manage health-related needs will improve Outcome: Progressing   Problem: Clinical Measurements: Goal: Ability to maintain clinical measurements within normal limits will improve Outcome: Progressing Goal: Will remain free from infection Outcome: Progressing Goal: Diagnostic test results will improve Outcome: Progressing Goal: Respiratory complications will improve Outcome: Progressing Goal: Cardiovascular complication will be avoided Outcome: Progressing   Problem: Activity: Goal: Risk for activity intolerance will decrease Outcome: Progressing   Problem: Nutrition: Goal: Adequate nutrition will be maintained Outcome: Progressing   Problem: Coping: Goal: Level of anxiety will decrease Outcome: Progressing   Problem: Elimination: Goal: Will not experience complications related to bowel motility Outcome: Progressing Goal: Will not experience complications related to urinary retention Outcome: Progressing   Problem: Pain Managment: Goal: General experience of comfort will improve and/or be controlled Outcome: Progressing   Problem: Safety: Goal: Ability to remain free from injury will improve Outcome: Progressing   Problem: Skin Integrity: Goal: Risk for impaired skin integrity will decrease Outcome: Progressing

## 2024-03-18 DIAGNOSIS — E11628 Type 2 diabetes mellitus with other skin complications: Secondary | ICD-10-CM | POA: Diagnosis not present

## 2024-03-18 DIAGNOSIS — I70261 Atherosclerosis of native arteries of extremities with gangrene, right leg: Secondary | ICD-10-CM | POA: Diagnosis not present

## 2024-03-18 DIAGNOSIS — R208 Other disturbances of skin sensation: Secondary | ICD-10-CM | POA: Diagnosis not present

## 2024-03-18 DIAGNOSIS — L97919 Non-pressure chronic ulcer of unspecified part of right lower leg with unspecified severity: Secondary | ICD-10-CM | POA: Diagnosis not present

## 2024-03-18 DIAGNOSIS — B9561 Methicillin susceptible Staphylococcus aureus infection as the cause of diseases classified elsewhere: Secondary | ICD-10-CM | POA: Diagnosis not present

## 2024-03-18 DIAGNOSIS — Z7982 Long term (current) use of aspirin: Secondary | ICD-10-CM | POA: Diagnosis not present

## 2024-03-18 DIAGNOSIS — Z794 Long term (current) use of insulin: Secondary | ICD-10-CM | POA: Diagnosis not present

## 2024-03-18 DIAGNOSIS — L97909 Non-pressure chronic ulcer of unspecified part of unspecified lower leg with unspecified severity: Secondary | ICD-10-CM | POA: Diagnosis not present

## 2024-03-18 DIAGNOSIS — I70239 Atherosclerosis of native arteries of right leg with ulceration of unspecified site: Secondary | ICD-10-CM | POA: Diagnosis not present

## 2024-03-18 DIAGNOSIS — T148XXD Other injury of unspecified body region, subsequent encounter: Secondary | ICD-10-CM

## 2024-03-18 DIAGNOSIS — I70299 Other atherosclerosis of native arteries of extremities, unspecified extremity: Secondary | ICD-10-CM | POA: Diagnosis not present

## 2024-03-18 DIAGNOSIS — I2510 Atherosclerotic heart disease of native coronary artery without angina pectoris: Secondary | ICD-10-CM

## 2024-03-18 DIAGNOSIS — Z9889 Other specified postprocedural states: Secondary | ICD-10-CM | POA: Diagnosis not present

## 2024-03-18 LAB — AEROBIC/ANAEROBIC CULTURE W GRAM STAIN (SURGICAL/DEEP WOUND): Gram Stain: NONE SEEN

## 2024-03-18 LAB — BASIC METABOLIC PANEL WITH GFR
Anion gap: 10 (ref 5–15)
BUN: 11 mg/dL (ref 8–23)
CO2: 25 mmol/L (ref 22–32)
Calcium: 8.6 mg/dL — ABNORMAL LOW (ref 8.9–10.3)
Chloride: 104 mmol/L (ref 98–111)
Creatinine, Ser: 1.17 mg/dL (ref 0.61–1.24)
GFR, Estimated: >60 mL/min (ref >60)
Glucose, Bld: 179 mg/dL — ABNORMAL HIGH (ref 70–99)
Potassium: 3.9 mmol/L (ref 3.5–5.1)
Sodium: 139 mmol/L (ref 135–145)

## 2024-03-18 LAB — GLUCOSE, CAPILLARY
Glucose-Capillary: 182 mg/dL — ABNORMAL HIGH (ref 70–99)
Glucose-Capillary: 142 mg/dL — ABNORMAL HIGH (ref 70–99)
Glucose-Capillary: 227 mg/dL — ABNORMAL HIGH (ref 70–99)
Glucose-Capillary: 224 mg/dL — ABNORMAL HIGH (ref 70–99)

## 2024-03-18 MED ORDER — CEFAZOLIN SODIUM-DEXTROSE 2-4 GM/100ML-% IV SOLN
2.0000 g | Freq: Three times a day (TID) | INTRAVENOUS | Status: DC
  Administered 2024-03-19 – 2024-03-20 (×5): 2 g via INTRAVENOUS
  Filled 2024-03-18 (×6): qty 100

## 2024-03-18 NOTE — Progress Notes (Addendum)
 PROGRESS NOTE   HPI was taken from Dr. Jama: The patient presents today for follow-up started regarding his peripheral arterial disease.  He has a wound on his right lower extremity which has been very slow to heal.  He has been getting excellent wound.  Podiatry but despite these he has not shown significant improvement.  He has podiatrist is hoping that we will be able to do hyperbaric oxygen therapy to try to help heal the wound.  The patient has a history of a left below-knee amputation.  He had a recent intervention on the right lower extremity on 01/15/2024 angioplasty and stent placement to the right popliteal and anterior tibial arteries.  Today having noninvasive studies which shows a noncompressible ABI on the right.  Arterial duplex shows primarily triphasic waveforms throughout the right lower extremity but monophasic waveforms in the tibial vessels.     Andrew Harding  FMW:[id number] DOB: [birth year] DOA: 03/11/2024 PCP: Fernande Ophelia [name], MD   Assessment & Plan:   Principal Problem:   Atherosclerosis of artery of extremity with ulceration (HCC) Active Problems:   Diabetic foot infection (HCC)   Chronic atrial fibrillation with RVR (HCC)   Allergic reaction   Benign essential hypertension   HLD (hyperlipidemia)   CAD (coronary artery disease)   Type 2 diabetes mellitus with stage 3b chronic kidney disease, with long-term current use of insulin  (HCC)  Assessment and Plan:  Atherosclerosis of artery of extremity with ulceration and diabetic right foot infection: s/p excision fifth metatarsal right foot, with application of wound VAC right foot by podiatry. Wound cx growing staph aureus. Continue on IV unasyn  as per ID. Nonweightbearing of right foot and will d/c w/ wound vac as per podiatry   Chronic a. fib: w/ RVR. Continue on metoprolol , eliquis    Allergic reaction: likely to cefepime . W/ itching all over & tachycardia which have resolved. Benadryl  prn    HTN: continue on  metoprolol    HLD: continue on statin    Hx of CAD: continue on statin, metoprolol     DM2: well controlled, HbA1c 5.7. Continue on glargine, SSI w/ accuchecks   CKDIIIb: Cr is labile. Avoid nephrotoxic meds        DVT prophylaxis: eliquis  Code Status: full  Family Communication:  Disposition Plan: as per primary team. Hospitalist team will sign off today as pt has been medically stable for several days now. Please feel free to contact hospitalist service if any further questions or concerns arise.   Level of care: Progressive Consultants:  Hospitalist  ID Podiatry   Procedures:   Antimicrobials: unasyn    Subjective: Pt c/o malaise.   Objective: Vitals:   03/17/24 2036 03/17/24 2343 03/18/24 0331 03/18/24 0837  BP: 112/67 132/89 110/66 111/71  Pulse: 81 77 72 83  Resp: 18 17 20 17   Temp: 98.5 F (36.9 C) 98.6 F (37 C) (!) 97.4 F (36.3 C) 98 F (36.7 C)  TempSrc:      SpO2: 98% 99% 98% 99%  Weight:      Height:        Intake/Output Summary (Last 24 hours) at 03/18/2024 0920 Last data filed at 03/18/2024 0400 Gross per 24 hour  Intake 970 ml  Output 1650 ml  Net -680 ml   Filed Weights   03/11/24 1508 03/13/24 0531 03/13/24 1208  Weight: 82 kg 82 kg 82 kg    Examination:  General exam: appears calm & comfortable  Respiratory system: clear breath sounds b/l  Cardiovascular system:  S1 & S2+. No rubs or clicks  Gastrointestinal system: abd is soft, NT, ND & normal bowel sounds Central nervous system: alert & oriented. Moves all extremities Psychiatry: judgement and insight appears at baseline. Appropriate mood and affect    Data Reviewed: I have personally reviewed following labs and imaging studies  CBC: Recent Labs  Lab 03/12/24 0553 03/13/24 0520 03/14/24 0926 03/15/24 0548 03/16/24 0523  WBC 8.7 7.6 9.9 6.8 6.6  HGB 11.2* 11.6* 10.3* 10.2* 10.2*  HCT 34.3* 36.6* 31.0* 30.1* 31.7*  MCV 83.5 85.7 84.0 82.9 85.2  PLT 271 273 218 212  222   Basic Metabolic Panel: Recent Labs  Lab 03/13/24 2022 03/15/24 0548 03/16/24 0523 03/17/24 0455 03/18/24 0443  NA 138 142 141 139 139  K 4.4 3.7 3.9 4.1 3.9  CL 104 109 106 106 104  CO2 22 23 25 27 25   GLUCOSE 352* 195* 135* 188* 179*  BUN 16 14 13 11 11   CREATININE 1.37* 1.17 1.04 1.08 1.17  CALCIUM 8.8* 8.6* 8.4* 8.5* 8.6*   GFR: Estimated Creatinine Clearance: 64.2 mL/min (by C-G formula based on SCr of 1.17 mg/dL). Liver Function Tests: No results for input(s): AST, ALT, ALKPHOS, BILITOT, PROT, ALBUMIN in the last 168 hours. No results for input(s): LIPASE, AMYLASE in the last 168 hours. No results for input(s): AMMONIA in the last 168 hours. Coagulation Profile: No results for input(s): INR, PROTIME in the last 168 hours. Cardiac Enzymes: No results for input(s): CKTOTAL, CKMB, CKMBINDEX, TROPONINI in the last 168 hours. BNP (last 3 results) No results for input(s): PROBNP in the last 8760 hours. HbA1C: No results for input(s): HGBA1C in the last 72 hours. CBG: Recent Labs  Lab 03/17/24 0847 03/17/24 1305 03/17/24 1651 03/17/24 2037 03/18/24 0837  GLUCAP 217* 194* 225* 178* 182*   Lipid Profile: No results for input(s): CHOL, HDL, LDLCALC, TRIG, CHOLHDL, LDLDIRECT in the last 72 hours. Thyroid Function Tests: No results for input(s): TSH, T4TOTAL, FREET4, T3FREE, THYROIDAB in the last 72 hours. Anemia Panel: No results for input(s): VITAMINB12, FOLATE, FERRITIN, TIBC, IRON , RETICCTPCT in the last 72 hours. Sepsis Labs: No results for input(s): PROCALCITON, LATICACIDVEN in the last 168 hours.  Recent Results (from the past 240 hours)  Aerobic/Anaerobic Culture w Gram Stain (surgical/deep wound)     Status: None (Preliminary result)   Collection Time: 03/13/24  1:00 PM   Specimen: Path fluid; Body Fluid  Result Value Ref Range Status   Specimen Description ABSCESS  Final    Special Requests RIGHT FIFTH METATARSAL  Final   Gram Stain   Final    NO WBC SEEN NO ORGANISMS SEEN Performed at [facility], [address], [place], [place] [address]    Culture   Final    FEW STAPHYLOCOCCUS AUREUS NO ANAEROBES ISOLATED; CULTURE IN PROGRESS FOR 5 DAYS    Report Status PENDING  Incomplete   Organism ID, Bacteria STAPHYLOCOCCUS AUREUS  Final      Susceptibility   Staphylococcus aureus - MIC*    CIPROFLOXACIN  <=0.5 SENSITIVE Sensitive     ERYTHROMYCIN RESISTANT Resistant     GENTAMICIN  <=0.5 SENSITIVE Sensitive     OXACILLIN 0.5 SENSITIVE Sensitive     TETRACYCLINE <=1 SENSITIVE Sensitive     VANCOMYCIN  <=0.5 SENSITIVE Sensitive     TRIMETH /SULFA  <=10 SENSITIVE Sensitive     CLINDAMYCIN RESISTANT Resistant     RIFAMPIN <=0.5 SENSITIVE Sensitive     Inducible Clindamycin POSITIVE Resistant  LINEZOLID 2 SENSITIVE Sensitive     * FEW STAPHYLOCOCCUS AUREUS         Radiology Studies: No results found.       Scheduled Meds:  apixaban   5 mg Oral BID   aspirin  EC  81 mg Oral Daily   gabapentin   300 mg Oral TID   insulin  aspart  0-15 Units Subcutaneous TID WC   insulin  aspart  0-5 Units Subcutaneous QHS   insulin  glargine-yfgn  14 Units Subcutaneous Q2200   metoprolol  tartrate  12.5 mg Oral BID   rosuvastatin  20 mg Oral Daily   sodium chloride  flush  3 mL Intravenous Q12H   traZODone   50 mg Oral QHS   Continuous Infusions:  ampicillin -sulbactam (UNASYN ) IV 3 g (03/18/24 0545)     LOS: 6 days      Anthony CHRISTELLA Pouch, MD [facility] Pager [phone]  If 7PM-7AM, please contact night-coverage [email or web address] 03/18/2024, 9:20 AM

## 2024-03-18 NOTE — Progress Notes (Signed)
 Occupational Therapy Treatment Patient Details Name: Andrew Harding MRN: [id number] DOB: 1950-05-13 Today's Date: 03/18/2024   History of present illness Andrew Harding is a 73yoM who comes to [facility] on 03/11/24 for vascular surgery to address atherosclerotic occlusive disease bilateral lower extremities with ulceration and nonhealing wounds of the right lower extremity. Pt underwent debridement with podiatry 2 days later. Per podiatry patient is to be nonweightbearing on his right lower extremity.   OT comments  Pt seen for OT tx. Pt denies complaints, agreeable to session. Pt reports having just completed EOB grooming tasks with set up from nursing. Pt setup with supplies and able to complete seated UB and LB bathing with remote supervision. Pt demonstrating good dynamic sitting balance and able to lean side to side to assist in completing LB bathing. Set up for applying lotion and deodorant afterwards. Pt denied complaints before, during, and after session. Continues to benefit from skilled OT Services to maximize return to PLOF.       If plan is discharge home, recommend the following:  Assistance with cooking/housework;A lot of help with walking and/or transfers;Assist for transportation;A little help with bathing/dressing/bathroom   Equipment Recommendations  Other (comment) (defer)    Recommendations for Other Services      Precautions / Restrictions Precautions Precautions: Fall Recall of Precautions/Restrictions: Intact Precaution/Restrictions Comments: wound VAC Restrictions Weight Bearing Restrictions Per Provider Order: Yes RLE Weight Bearing Per Provider Order: Non weight bearing       Mobility Bed Mobility Overal bed mobility: Modified Independent                  Transfers                         Balance Overall balance assessment: Needs assistance Sitting-balance support: No upper extremity supported Sitting balance-Leahy Scale: Good Sitting balance -  Comments: good static and dynamic sitting during seated bathing                                   ADL either performed or assessed with clinical judgement   ADL Overall ADL's : Needs assistance/impaired     Grooming: Sitting;Modified independent   Upper Body Bathing: Sitting;Set up;Supervision/ safety   Lower Body Bathing: Sitting/lateral leans;Supervison/ safety;Set up   Upper Body Dressing : Sitting;Set up                          Extremity/Trunk Assessment              Vision       Perception     Praxis     Communication Communication Communication: No apparent difficulties   Cognition Arousal: Alert Behavior During Therapy: WFL for tasks assessed/performed Cognition: No apparent impairments                               Following commands: Intact        Cueing   Cueing Techniques: Verbal cues  Exercises      Shoulder Instructions       General Comments      Pertinent Vitals/ Pain       Pain Assessment Pain Assessment: No/denies pain  Home Living  Prior Functioning/Environment              Frequency  Min 2X/week        Progress Toward Goals  OT Goals(current goals can now be found in the care plan section)  Progress towards OT goals: Progressing toward goals  Acute Rehab OT Goals Patient Stated Goal: return to rehab OT Goal Formulation: With patient Time For Goal Achievement: 03/29/24 Potential to Achieve Goals: Good  Plan      Co-evaluation                 AM-PAC OT 6 Clicks Daily Activity     Outcome Measure   Help from another person eating meals?: None Help from another person taking care of personal grooming?: None Help from another person toileting, which includes using toliet, bedpan, or urinal?: A Lot Help from another person bathing (including washing, rinsing, drying)?: A Little Help from another person  to put on and taking off regular upper body clothing?: A Little Help from another person to put on and taking off regular lower body clothing?: A Little 6 Click Score: 19    End of Session    OT Visit Diagnosis: Muscle weakness (generalized) (M62.81)   Activity Tolerance Patient tolerated treatment well   Patient Left in bed;with call bell/phone within reach   Nurse Communication          Time: 8543-8484 OT Time Calculation (min): 19 min  Charges: OT General Charges $OT Visit: 1 Visit OT Treatments $Self Care/Home Management : 8-22 mins  Warren SAUNDERS., MPH, MS, OTR/L ascom [phone] 03/18/24, 3:18 PM

## 2024-03-18 NOTE — Progress Notes (Signed)
 Date of Admission:  03/11/2024     ID: Andrew Harding is a 73 y.o. male Principal Problem:   Atherosclerosis of artery of extremity with ulceration (HCC) Active Problems:   Type 2 diabetes mellitus with stage 3b chronic kidney disease, with long-term current use of insulin  (HCC)   Benign essential hypertension   Diabetic foot infection (HCC)   Chronic atrial fibrillation with RVR (HCC)   Allergic reaction   HLD (hyperlipidemia)   CAD (coronary artery disease)    Subjective: Pt doing well No pain  Medications:   apixaban   5 mg Oral BID   aspirin  EC  81 mg Oral Daily   gabapentin   300 mg Oral TID   insulin  aspart  0-15 Units Subcutaneous TID WC   insulin  aspart  0-5 Units Subcutaneous QHS   insulin  glargine-yfgn  14 Units Subcutaneous Q2200   metoprolol  tartrate  12.5 mg Oral BID   rosuvastatin  20 mg Oral Daily   sodium chloride  flush  3 mL Intravenous Q12H   traZODone   50 mg Oral QHS    Objective: Vital signs in last 24 hours: Patient Vitals for the past 24 hrs:  BP Temp Pulse Resp SpO2 Weight  03/18/24 1953 119/61 98.2 F (36.8 C) 77 18 98 % --  03/18/24 1544 118/71 98 F (36.7 C) 76 16 98 % --  03/18/24 1143 132/75 98.1 F (36.7 C) 60 16 98 % 76.2 kg  03/18/24 0837 111/71 98 F (36.7 C) 83 17 99 % --  03/18/24 0331 110/66 (!) 97.4 F (36.3 C) 72 20 98 % --  03/17/24 2343 132/89 98.6 F (37 C) 77 17 99 % --     Lines and Device Date on insertion # of days DC  Engineer, [employer]     ETT       PHYSICAL EXAM:  General: Alert, cooperative, no distress, appears stated age.  Lungs: Clear to auscultation bilaterally. No Wheezing or Rhonchi. No rales. Heart: Regular rate and rhythm, no murmur, rub or gallop. Abdomen: Soft, non-tender,not distended. Bowel sounds normal. No masses Extremities: rt foot- has wound vac Picture reviewed     Skin: No rashes or lesions. Or bruising Lymph: Cervical, supraclavicular normal. Neurologic: Grossly  non-focal  Lab Results    Latest Ref Rng & Units 03/16/2024    5:23 AM 03/15/2024    5:48 AM 03/14/2024    9:26 AM  CBC  WBC 4.0 - 10.5 K/uL 6.6  6.8  9.9   Hemoglobin 13.0 - 17.0 g/dL 89.7  89.7  89.6   Hematocrit 39.0 - 52.0 % 31.7  30.1  31.0   Platelets 150 - 400 K/uL 222  212  218        Latest Ref Rng & Units 03/18/2024    4:43 AM 03/17/2024    4:55 AM 03/16/2024    5:23 AM  CMP  Glucose 70 - 99 mg/dL 820  811  864   BUN 8 - 23 mg/dL 11  11  13    Creatinine 0.61 - 1.24 mg/dL 8.82  8.91  8.95   Sodium 135 - 145 mmol/L 139  139  141   Potassium 3.5 - 5.1 mmol/L 3.9  4.1  3.9   Chloride 98 - 111 mmol/L 104  106  106   CO2 22 - 32 mmol/L 25  27  25    Calcium 8.9 - 10.3 mg/dL 8.6  8.5  8.4  Microbiology: WC MSSA  Assessment/Plan: 73 year old male with history of diabetes, peripheral neuropathy, PAD, left BKA, CAD status post CABG, hypertension, A-fib on Eliquis , presents with an ongoing ischemic wound on the lateral margin of the right TMA.  Since May 2025 patient has been having right foot infection.  Initially it right great toe amputation followed by TMA on 11/02/2023 and he was treated with 6 weeks of IV daptomycin  for MRSA and Enterococcus faecalis osteomyelitis.  The wound healing had been complicated by superficial necrosis at the surgical site and patient has had multiple vascular procedures of the right lower extremity.  On 12/21/2023 he had further debridement of the right TMA there were 3 different organisms and he received Levaquin and Augmentin .  He was readmitted from 01/10/2024 until 01/16/2024 and further debridement of TMA site was done.  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  A culture with Enterobacter and Prevotella.  He was discharged on ertapenem  IV and completed 6 weeks of it on 02/21/2019 and PICC line was removed.  He was doing well and was off antibiotics until 03/06/2024 when he was restarted on Levaquin and Augmentin  after culture that was  taken by Dr. Ashley in his office. culture was MSSA.  Patient is readmitted for further debridement of an eschar on the lateral margin as well as for vascular procedure  New ischemic eschar on the lateral margin of the right foot. Recurrent infection of the lateral end of the surgical site Patient underwent irrigation and debridement on also excision of the fifth metatarsal on 03/13/2024. Surgical cultures so far is Staph aureus Patient is currently on Unasyn  Will change to cefazolin  Pathology margin is negative for osteo Discussed with podiatrist.  As patient's infection bone has been resected he could go on p.o. antibiotic  PAD status post balloon angioplasty of the right anterior tibial artery  Cefepime  caused tachycardia and burning and itching reaction It was discontinued.  And Benadryl  was given which resolved the symptoms Patient has tolerated cefepime  in the past I do not think cefazolin  will have a cross reaction as it has a different side range. He also received cefazolin  in the past as well as during this hospitalization.  Diabetes mellitus on insulin   CAD status post CABG On Crestor and metoprolol  Discussed the management with the patient.  Discussed the management with Dr. Ashley.

## 2024-03-18 NOTE — Progress Notes (Signed)
 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  Introduction catheter into right lower extremity 3rd order catheter placement with additional third order catheter placement             2.  Contrast injection right lower extremity for distal runoff with additional 3rd order             3.  Percutaneous transluminal angioplasty right anterior tibial artery                          4.  Star close closure left common femoral arteriotomy   Operative note: 03/13/2024   POD #5    Surgeon:Justin Ashley     Assistant: None     Preop diagnosis: Necrotic ulcer lateral right foot     Postop diagnosis: Same     Procedure: 1 excision fifth metatarsal right foot 2.  Application of wound VAC right foot 3.  Intraoperative fluoroscopy without assistance of radiologist Patient is recovering as expected.  Patient had a reaction to an IV antibiotic last night.  He was given Benadryl  IV and all the symptoms resolved 20 minutes later.  No complications to note.  No complications overnight. Patient endorses leg feels much better.  Per podiatry patient is to be nonweightbearing on his right lower extremity.  Vitals are remained stable.    Vitals:   03/18/24 0331 03/18/24 0837  BP: 110/66 111/71  Pulse: 72 83  Resp: 20 17  Temp: (!) 97.4 F (36.3 C) 98 F (36.7 C)  SpO2: 98% 99%   Physical Exam: Cardiac:  Irregular Hear Rate Hx of Atrial Fibrillation  Lungs: Nonlabored breathing, clear on auscultation throughout, no rales rhonchi or wheezing. Incisions: Left groin puncture site with dressing clean dry and intact.  No hematoma seroma  noted. Extremities: Right lower extremity with Doppler pulses only.  Extremities warm to touch. Wound vac in place and working well. Abdomen: Positive bowel sounds throughout, soft, nontender nondistended. Neurologic: Alert and oriented x 3, answers all questions follows commands appropriately.  CBC    Component Value Date/Time   WBC 6.6 03/16/2024 0523   RBC 3.72 (L) 03/16/2024 0523   HGB 10.2 (L) 03/16/2024 0523   HCT 31.7 (L) 03/16/2024 0523   PLT 222 03/16/2024 0523   MCV 85.2 03/16/2024 0523   MCH 27.4 03/16/2024 0523   MCHC 32.2 03/16/2024 0523   RDW 13.4 03/16/2024 0523   LYMPHSABS 1.2 01/10/2024 1218   MONOABS 0.4 01/10/2024 1218   EOSABS 0.3 01/10/2024 1218   BASOSABS 0.1 01/10/2024 1218    BMET    Component Value Date/Time   NA 139 03/18/2024 0443   K 3.9 03/18/2024 0443   CL 104 03/18/2024 0443   CO2 25 03/18/2024 0443   GLUCOSE 179 (H) 03/18/2024 0443   GLUCOSE 402 (H) 09/17/2012 1417   BUN 11 03/18/2024 0443   CREATININE 1.17 03/18/2024 0443   CALCIUM 8.6 (L) 03/18/2024 0443   GFRNONAA >60 03/18/2024 0443   GFRAA 58 (L) 06/02/2015 0656    INR  Component Value Date/Time   INR 1.2 01/12/2024 1433     Intake/Output Summary (Last 24 hours) at 03/18/2024 0943 Last data filed at 03/18/2024 0400 Gross per 24 hour  Intake 970 ml  Output 1650 ml  Net -680 ml     Assessment/Plan:  73 y.o. male is s/p See Above 5 Days Post-Op   PLAN Right Foot Vac changed yesterday via podiatry. No complications to note.  Continue Eliquis /ASA Continue to work with PT/OT OK from Vascular standpoint for discharge to Rehab  DVT prophylaxis:  Eliquis  and ASA    [name] [facility] 03/18/2024 9:43 AM

## 2024-03-18 NOTE — Consult Note (Signed)
 WOC team consulted for NPWT dressing changes M-W-F to begin Wednesday 03/19/2024.    Patient underwent right 5th metatarsal excision with NPWT placement by Dr. Ashley, 11/13.    WOC team will follow as above.  Thank you,    Powell Bar MSN, RN-BC, [employer]

## 2024-03-18 NOTE — Plan of Care (Signed)
   Problem: Education: Goal: Ability to describe self-care measures that may prevent or decrease complications (Diabetes Survival Skills Education) will improve Outcome: Progressing Goal: Individualized Educational Video(s) Outcome: Progressing   Problem: Coping: Goal: Ability to adjust to condition or change in health will improve Outcome: Progressing   Problem: Fluid Volume: Goal: Ability to maintain a balanced intake and output will improve Outcome: Progressing   Problem: Health Behavior/Discharge Planning: Goal: Ability to identify and utilize available resources and services will improve Outcome: Progressing Goal: Ability to manage health-related needs will improve Outcome: Progressing   Problem: Metabolic: Goal: Ability to maintain appropriate glucose levels will improve Outcome: Progressing   Problem: Nutritional: Goal: Maintenance of adequate nutrition will improve Outcome: Progressing Goal: Progress toward achieving an optimal weight will improve Outcome: Progressing   Problem: Skin Integrity: Goal: Risk for impaired skin integrity will decrease Outcome: Progressing   Problem: Tissue Perfusion: Goal: Adequacy of tissue perfusion will improve Outcome: Progressing   Problem: Education: Goal: Understanding of CV disease, CV risk reduction, and recovery process will improve Outcome: Progressing Goal: Individualized Educational Video(s) Outcome: Progressing   Problem: Activity: Goal: Ability to return to baseline activity level will improve Outcome: Progressing   Problem: Cardiovascular: Goal: Ability to achieve and maintain adequate cardiovascular perfusion will improve Outcome: Progressing Goal: Vascular access site(s) Level 0-1 will be maintained Outcome: Progressing   Problem: Health Behavior/Discharge Planning: Goal: Ability to safely manage health-related needs after discharge will improve Outcome: Progressing   Problem: Education: Goal: Knowledge  of General Education information will improve Description: Including pain rating scale, medication(s)/side effects and non-pharmacologic comfort measures Outcome: Progressing   Problem: Health Behavior/Discharge Planning: Goal: Ability to manage health-related needs will improve Outcome: Progressing   Problem: Clinical Measurements: Goal: Ability to maintain clinical measurements within normal limits will improve Outcome: Progressing Goal: Will remain free from infection Outcome: Progressing Goal: Diagnostic test results will improve Outcome: Progressing Goal: Respiratory complications will improve Outcome: Progressing Goal: Cardiovascular complication will be avoided Outcome: Progressing   Problem: Activity: Goal: Risk for activity intolerance will decrease Outcome: Progressing   Problem: Nutrition: Goal: Adequate nutrition will be maintained Outcome: Progressing   Problem: Coping: Goal: Level of anxiety will decrease Outcome: Progressing   Problem: Elimination: Goal: Will not experience complications related to bowel motility Outcome: Progressing Goal: Will not experience complications related to urinary retention Outcome: Progressing   Problem: Pain Managment: Goal: General experience of comfort will improve and/or be controlled Outcome: Progressing   Problem: Safety: Goal: Ability to remain free from injury will improve Outcome: Progressing   Problem: Skin Integrity: Goal: Risk for impaired skin integrity will decrease Outcome: Progressing

## 2024-03-18 NOTE — TOC Progression Note (Signed)
 Transition of Care [facility]) - Progression Note    Patient Details  Name: Andrew Harding MRN: [id number] Date of Birth: 1951/01/11  [facility] CM/SW Contact  Shasta DELENA Daring, RN Phone Number: 03/18/2024, 1:56 PM  Clinical Narrative:    Advised patient of bed offer from [facility] and patient accepted. Notified inpatient provider.  Pending medical discharge readiness.   Expected Discharge Plan: Skilled Nursing Facility Barriers to Discharge: Continued Medical Work up               Expected Discharge Plan and Services     Post Acute Care Choice: Skilled Nursing Facility Living arrangements for the past 2 months: Single Family Home                                       Social Drivers of Health (SDOH) Interventions SDOH Screenings   Food Insecurity: No Food Insecurity (03/12/2024)  Housing: Low Risk  (03/12/2024)  Transportation Needs: No Transportation Needs (03/12/2024)  Utilities: Not At Risk (03/12/2024)  Depression (PHQ2-9): Low Risk  (05/03/2023)  Financial Resource Strain: Low Risk  (02/22/2024)   Received from [facility]  Social Connections: Unknown (03/12/2024)  Recent Concern: Social Connections - Moderately Isolated (01/10/2024)  Tobacco Use: Medium Risk (03/13/2024)    Readmission Risk Interventions    03/14/2024    2:43 PM 01/14/2024   10:52 AM 02/28/2023   12:53 PM  Readmission Risk Prevention Plan  Transportation Screening Complete Complete Complete  PCP or Specialist Appt within 3-5 Days Complete Complete Complete  HRI or Home Care Consult  Complete Complete  Social Work Consult for Recovery Care Planning/Counseling Complete Complete Complete  Palliative Care Screening Not Applicable Not Applicable Not Applicable  Medication Review Oceanographer) Complete Complete Complete

## 2024-03-19 ENCOUNTER — Other Ambulatory Visit: Payer: Self-pay

## 2024-03-19 DIAGNOSIS — I70261 Atherosclerosis of native arteries of extremities with gangrene, right leg: Secondary | ICD-10-CM | POA: Diagnosis not present

## 2024-03-19 DIAGNOSIS — E11628 Type 2 diabetes mellitus with other skin complications: Secondary | ICD-10-CM | POA: Diagnosis not present

## 2024-03-19 DIAGNOSIS — L089 Local infection of the skin and subcutaneous tissue, unspecified: Secondary | ICD-10-CM

## 2024-03-19 DIAGNOSIS — B9561 Methicillin susceptible Staphylococcus aureus infection as the cause of diseases classified elsewhere: Secondary | ICD-10-CM | POA: Diagnosis not present

## 2024-03-19 DIAGNOSIS — R208 Other disturbances of skin sensation: Secondary | ICD-10-CM | POA: Diagnosis not present

## 2024-03-19 LAB — GLUCOSE, CAPILLARY
Glucose-Capillary: 171 mg/dL — ABNORMAL HIGH (ref 70–99)
Glucose-Capillary: 139 mg/dL — ABNORMAL HIGH (ref 70–99)
Glucose-Capillary: 227 mg/dL — ABNORMAL HIGH (ref 70–99)
Glucose-Capillary: 268 mg/dL — ABNORMAL HIGH (ref 70–99)

## 2024-03-19 NOTE — Discharge Instructions (Addendum)
 Vascular Surgery Discharge Instructions:  Do not lift anything heavy for the next 2 weeks.  Do not lift anything more than 10 pounds.  Left groin puncture site to be covered with a Band-Aid and change daily.  Wound VAC to right lower extremity/foot.  Wound VAC to be changed 3 times a week.  Patient will be non weight bearing on right lower extremity until follow up with Dr Eva Gay of Podiatry.   Patient to follow-up with podiatry in 2 weeks with Dr. Eva Gay.  Patient to follow up with Infectious Disease as scheduled.

## 2024-03-19 NOTE — Treatment Plan (Signed)
 Diagnosis: Staph aureus foot infection with osteomyelitis Baseline Creatinine 1.17 CrCl > 60    Allergies  Allergen Reactions   Cefepime  Itching    OPAT Orders Cefazolin  2 grams IV every 8 hours:  For 6  weeks ( we may be able to transition to PO KEFLEX  1 gram Q6 hours  after 4 weeks if his wound is progressing well)  End Date:04/24/24   [facility] Care Per Protocol:  Labs weekly while on IV antibiotics: _X_ CBC with differential  _X_ CMP _X_ CRP _X_ ESR   _X_ Please pull PIC at completion of IV antibiotics __ Please leave PIC in place until doctor has seen patient or been notified  Fax weekly lab results  promptly to [phone] & [phone]  Clinic Follow Up Appt: 04/01/24 at 8.45 AM   Call [phone] with any critical value or questions

## 2024-03-19 NOTE — TOC Progression Note (Addendum)
 Transition of Care [facility]) - Progression Note    Patient Details  Name: Andrew Harding MRN: [id number] Date of Birth: 1951/04/22  [facility] CM/SW Contact  Shasta DELENA Daring, RN Phone Number: 03/19/2024, 12:19 PM  Clinical Narrative:     [facility] will [facility] to [facility] when auth is obtained and bed confirmed. [facility] will order wound vac. Advised them that medium size dressing will be appropriate. TOC will update inpatient team when bed is available.  3:35 PM Notifed [facility] that new PT note is in.  [facility] said patient only has 16 days left for rehab.  Patient notified via telephone by SarahB, LCSW.  Expected Discharge Plan: Skilled Nursing Facility Barriers to Discharge: Continued Medical Work up               Expected Discharge Plan and Services     Post Acute Care Choice: Skilled Nursing Facility Living arrangements for the past 2 months: Single Family Home                                       Social Drivers of Health (SDOH) Interventions SDOH Screenings   Food Insecurity: No Food Insecurity (03/12/2024)  Housing: Low Risk  (03/12/2024)  Transportation Needs: No Transportation Needs (03/12/2024)  Utilities: Not At Risk (03/12/2024)  Depression (PHQ2-9): Low Risk  (05/03/2023)  Financial Resource Strain: Low Risk  (02/22/2024)   Received from [facility]  Social Connections: Unknown (03/12/2024)  Recent Concern: Social Connections - Moderately Isolated (01/10/2024)  Tobacco Use: Medium Risk (03/13/2024)    Readmission Risk Interventions    03/14/2024    2:43 PM 01/14/2024   10:52 AM 02/28/2023   12:53 PM  Readmission Risk Prevention Plan  Transportation Screening Complete Complete Complete  PCP or Specialist Appt within 3-5 Days Complete Complete Complete  HRI or Home Care Consult  Complete Complete  Social Work Consult for Recovery Care Planning/Counseling Complete Complete Complete  Palliative Care Screening Not Applicable  Not Applicable Not Applicable  Medication Review Oceanographer) Complete Complete Complete

## 2024-03-19 NOTE — Progress Notes (Signed)
 PROGRESS NOTE   HPI was taken from Dr. Jama: The patient presents today for follow-up started regarding his peripheral arterial disease.  He has a wound on his right lower extremity which has been very slow to heal.  He has been getting excellent wound.  Podiatry but despite these he has not shown significant improvement.  He has podiatrist is hoping that we will be able to do hyperbaric oxygen therapy to try to help heal the wound.  The patient has a history of a left below-knee amputation.  He had a recent intervention on the right lower extremity on 01/15/2024 angioplasty and stent placement to the right popliteal and anterior tibial arteries.  Today having noninvasive studies which shows a noncompressible ABI on the right.  Arterial duplex shows primarily triphasic waveforms throughout the right lower extremity but monophasic waveforms in the tibial vessels.     Andrew Harding  FMW:[id number] DOB: 31-Aug-1950 DOA: 03/11/2024 PCP: Fernande Ophelia [name], MD   Assessment & Plan:   Principal Problem:   Atherosclerosis of artery of extremity with ulceration (HCC) Active Problems:   Diabetic foot infection (HCC)   Chronic atrial fibrillation with RVR (HCC)   Allergic reaction   Benign essential hypertension   HLD (hyperlipidemia)   CAD (coronary artery disease)   Type 2 diabetes mellitus with stage 3b chronic kidney disease, with long-term current use of insulin  (HCC)  Assessment and Plan:  Atherosclerosis of artery of extremity with ulceration and diabetic right foot infection  osteomyelitis : s/p excision fifth metatarsal right foot, with application of wound VAC right foot by podiatry. Wound cx growing MSSA. Nonweightbearing of right foot and will d/c w/ wound vac as per podiatry continue IV cefazolin  for MSSA infection for 6 weeks Will be able to switch to PO after 4 weeks of IV if he is progressing well    Chronic a. fib: w/ RVR. Continue on metoprolol , eliquis    Allergic reaction:  Resolved now   HTN: continue on metoprolol    HLD: continue on statin    Hx of CAD: continue on statin, metoprolol     DM2: well controlled, HbA1c 5.7. Continue on glargine, SSI w/ accuchecks   CKDIIIb: Cr is stable      DVT prophylaxis: eliquis  Code Status: full  Family Communication:  Disposition Plan: as per primary team. Hospitalist team will sign off today as pt has been medically stable for several days now. Please feel free to contact hospitalist service if any further questions or concerns arise.   Level of care: Progressive Consultants:  Hospitalist  ID Podiatry   Procedures:   Antimicrobials: Ancef    Subjective:  No new issues.  Waiting for placement.  Friend at bedside  Objective: Vitals:   03/19/24 0739 03/19/24 1156 03/19/24 1339 03/19/24 1532  BP: (!) 137/59 123/64  119/64  Pulse: 79 68  80  Resp: 15 18  15   Temp: 97.6 F (36.4 C) 98.4 F (36.9 C)  98.5 F (36.9 C)  TempSrc:      SpO2: 96% 99%  97%  Weight:   75.6 kg   Height:        Intake/Output Summary (Last 24 hours) at 03/19/2024 1723 Last data filed at 03/19/2024 1641 Gross per 24 hour  Intake 1420 ml  Output 1550 ml  Net -130 ml   Filed Weights   03/13/24 1208 03/18/24 1143 03/19/24 1339  Weight: 82 kg 76.2 kg 75.6 kg    Examination:  General exam: appears calm & comfortable  Respiratory system: clear breath sounds b/l  Cardiovascular system: S1 & S2+. No rubs or clicks  Gastrointestinal system: abd is soft, NT, ND & normal bowel sounds Central nervous system: alert & oriented. Moves all extremities Psychiatry: judgement and insight appears at baseline. Appropriate mood and affect    Data Reviewed: I have personally reviewed following labs and imaging studies  CBC: Recent Labs  Lab 03/13/24 0520 03/14/24 0926 03/15/24 0548 03/16/24 0523  WBC 7.6 9.9 6.8 6.6  HGB 11.6* 10.3* 10.2* 10.2*  HCT 36.6* 31.0* 30.1* 31.7*  MCV 85.7 84.0 82.9 85.2  PLT 273 218 212 222    Basic Metabolic Panel: Recent Labs  Lab 03/13/24 2022 03/15/24 0548 03/16/24 0523 03/17/24 0455 03/18/24 0443  NA 138 142 141 139 139  K 4.4 3.7 3.9 4.1 3.9  CL 104 109 106 106 104  CO2 22 23 25 27 25   GLUCOSE 352* 195* 135* 188* 179*  BUN 16 14 13 11 11   CREATININE 1.37* 1.17 1.04 1.08 1.17  CALCIUM 8.8* 8.6* 8.4* 8.5* 8.6*    CBG: Recent Labs  Lab 03/18/24 1642 03/18/24 1954 03/19/24 0739 03/19/24 1157 03/19/24 1638  GLUCAP 227* 224* 171* 139* 227*     Recent Results (from the past 240 hours)  Aerobic/Anaerobic Culture w Gram Stain (surgical/deep wound)     Status: None   Collection Time: 03/13/24  1:00 PM   Specimen: Path fluid; Body Fluid  Result Value Ref Range Status   Specimen Description ABSCESS  Final   Special Requests RIGHT FIFTH METATARSAL  Final   Gram Stain NO WBC SEEN NO ORGANISMS SEEN   Final   Culture   Final    FEW STAPHYLOCOCCUS AUREUS NO ANAEROBES ISOLATED Performed at [facility], [address], [place], [place] [address]    Report Status 03/18/2024 FINAL  Final   Organism ID, Bacteria STAPHYLOCOCCUS AUREUS  Final      Susceptibility   Staphylococcus aureus - MIC*    CIPROFLOXACIN  <=0.5 SENSITIVE Sensitive     ERYTHROMYCIN RESISTANT Resistant     GENTAMICIN  <=0.5 SENSITIVE Sensitive     OXACILLIN 0.5 SENSITIVE Sensitive     TETRACYCLINE <=1 SENSITIVE Sensitive     VANCOMYCIN  <=0.5 SENSITIVE Sensitive     TRIMETH /SULFA  <=10 SENSITIVE Sensitive     CLINDAMYCIN RESISTANT Resistant     RIFAMPIN <=0.5 SENSITIVE Sensitive     Inducible Clindamycin POSITIVE Resistant     LINEZOLID 2 SENSITIVE Sensitive     * FEW STAPHYLOCOCCUS AUREUS         Radiology Studies: US  EKG SITE RITE Result Date: 03/19/2024 If Site Rite image not attached, placement could not be confirmed due to current cardiac rhythm.        Scheduled Meds:  apixaban   5 mg Oral BID   aspirin  EC  81 mg Oral Daily   gabapentin   300 mg Oral TID    insulin  aspart  0-15 Units Subcutaneous TID WC   insulin  aspart  0-5 Units Subcutaneous QHS   insulin  glargine-yfgn  14 Units Subcutaneous Q2200   metoprolol  tartrate  12.5 mg Oral BID   rosuvastatin  20 mg Oral Daily   sodium chloride  flush  3 mL Intravenous Q12H   traZODone   50 mg Oral QHS   Continuous Infusions:   ceFAZolin  (ANCEF ) IV 2 g (03/19/24 1413)     LOS: 7 days   Time spent 20 minutes   Cresencio Fairly, MD [facility] Pager [phone]  If 7PM-7AM, please contact night-coverage [email or web address] 03/19/2024, 5:23 PM

## 2024-03-19 NOTE — Progress Notes (Signed)
 Occupational Therapy Treatment Patient Details Name: Andrew Harding MRN: [id number] DOB: 05/27/1950 Today's Date: 03/19/2024   History of present illness Andrew Harding is a 73yoM who comes [facility] on 03/11[facility]24 for vascular surgery to address atherosclerotic occlusive disease bilateral lower extremities with ulceration and nonhealing wounds of the right lower extremity. Pt underwent debridement with podiatry 2 days later. Per podiatry patient is to be nonweightbearing on his right lower extremity.   OT comments  Session focused on UE HEP education to maximize overall UB strength for ADLs/functional transfers given RLE NWB status. Pt able to return demo 4/4 UE exercises well with only minor cues. Written handout provided to maximize carryover and encouraged pt to complete outside of therapy sessions. Patient will benefit from continued inpatient follow up therapy, <3 hours/day at DC.      If plan is discharge home, recommend the following:  Assistance with cooking/housework;A lot of help with walking and/or transfers;Assist for transportation;A little help with bathing/dressing/bathroom   Equipment Recommendations  Other (comment) (TBD pending progress)    Recommendations for Other Services      Precautions / Restrictions Precautions Precautions: Fall Recall of Precautions/Restrictions: Intact Precaution/Restrictions Comments: wound VAC RLE, hx of L BKA, has prosthetic Restrictions Weight Bearing Restrictions Per Provider Order: Yes RLE Weight Bearing Per Provider Order: Non weight bearing       Mobility Bed Mobility Overal bed mobility: Modified Independent                  Transfers                         Balance                                           ADL either performed or assessed with clinical judgement   ADL Overall ADL's : Needs assistance/impaired                                       General ADL Comments: Pt  politely deferred ADL retraining but open to UE HEP education to maximize UB strength for ADLs/transfers given RLE NWB precautions. Provided handout to maximize carryover.    Extremity/Trunk Assessment Upper Extremity Assessment Upper Extremity Assessment: Overall WFL for tasks assessed;Right hand dominant   Lower Extremity Assessment Lower Extremity Assessment: Defer to PT evaluation        Vision   Vision Assessment?: No apparent visual deficits   Perception     Praxis     Communication Communication Communication: No apparent difficulties   Cognition Arousal: Alert Behavior During Therapy: WFL for tasks assessed/performed Cognition: No apparent impairments                               Following commands: Intact        Cueing   Cueing Techniques: Verbal cues  Exercises Exercises: General Upper Extremity General Exercises - Upper Extremity Shoulder Flexion: Strengthening, Both, Theraband, 20 reps Theraband Level (Shoulder Flexion): Level 2 (Red) Shoulder Horizontal ABduction: Strengthening, Both, 20 reps, Theraband Theraband Level (Shoulder Horizontal Abduction): Level 2 (Red) Elbow Flexion: Strengthening, Both, 10 reps, Theraband Theraband Level (Elbow Flexion): Level 2 (Red) Elbow Extension: Strengthening, Both, 10 reps, Theraband  Theraband Level (Elbow Extension): Level 2 (Red)    Shoulder Instructions       General Comments      Pertinent Vitals/ Pain       Pain Assessment Pain Assessment: No/denies pain  Home Living                                          Prior Functioning/Environment              Frequency  Min 2X/week        Progress Toward Goals  OT Goals(current goals can now be found in the care plan section)  Progress towards OT goals: Progressing toward goals  Acute Rehab OT Goals Patient Stated Goal: have adequate wound care, optimal healing of R foot to avoid LE amputation OT Goal  Formulation: With patient Time For Goal Achievement: 03/29/24 Potential to Achieve Goals: Good ADL Goals Pt Will Perform Lower Body Dressing: with modified independence Pt Will Transfer to Toilet: with modified independence Additional ADL Goal #1: Pt. will be independent with HEP for BUE strength to assist with sustaining NWB status with the walker use.  Plan      Co-evaluation                 AM-PAC OT 6 Clicks Daily Activity     Outcome Measure   Help from another person eating meals?: None Help from another person taking care of personal grooming?: None Help from another person toileting, which includes using toliet, bedpan, or urinal?: A Lot Help from another person bathing (including washing, rinsing, drying)?: A Little Help from another person to put on and taking off regular upper body clothing?: A Little Help from another person to put on and taking off regular lower body clothing?: A Little 6 Click Score: 19    End of Session    OT Visit Diagnosis: Muscle weakness (generalized) (M62.81)   Activity Tolerance Patient tolerated treatment well   Patient Left in bed;with call bell/phone within reach   Nurse Communication          Time: 8873-8850 OT Time Calculation (min): 23 min  Charges: OT Treatments $Therapeutic Exercise: 8-22 mins  Mliss NOVAK, OTR/L Acute Rehab Services Office: [phone]   Mliss Fish 03/19/2024, 12:02 PM

## 2024-03-19 NOTE — Progress Notes (Signed)
 PHARMACY CONSULT NOTE FOR:  OUTPATIENT  PARENTERAL ANTIBIOTIC THERAPY (OPAT)  Indication: MSSA foot osteomyelitis Regimen: Cefazolin  2g IV q8h End date: 04/24/24 Labs - Once weekly:  CBC/D and CMP, Labs - Once weekly: ESR and CRP Fax weekly lab results  promptly to [phone] & [phone] Please pull PIC at completion of IV antibiotics Call [phone] with any critical value or questions    IV antibiotic discharge orders are pended. To discharging provider:  please sign these orders via discharge navigator,  Select New Orders & click on the button choice - Manage This Unsigned Work.     Thank you for allowing pharmacy to be a part of this patient's care.  Andrew Harding Andrew Harding 03/19/2024, 11:28 AM

## 2024-03-19 NOTE — Plan of Care (Signed)
   Problem: Education: Goal: Ability to describe self-care measures that may prevent or decrease complications (Diabetes Survival Skills Education) will improve Outcome: Progressing Goal: Individualized Educational Video(s) Outcome: Progressing   Problem: Coping: Goal: Ability to adjust to condition or change in health will improve Outcome: Progressing   Problem: Fluid Volume: Goal: Ability to maintain a balanced intake and output will improve Outcome: Progressing   Problem: Health Behavior/Discharge Planning: Goal: Ability to identify and utilize available resources and services will improve Outcome: Progressing Goal: Ability to manage health-related needs will improve Outcome: Progressing   Problem: Metabolic: Goal: Ability to maintain appropriate glucose levels will improve Outcome: Progressing   Problem: Nutritional: Goal: Maintenance of adequate nutrition will improve Outcome: Progressing Goal: Progress toward achieving an optimal weight will improve Outcome: Progressing   Problem: Skin Integrity: Goal: Risk for impaired skin integrity will decrease Outcome: Progressing   Problem: Tissue Perfusion: Goal: Adequacy of tissue perfusion will improve Outcome: Progressing   Problem: Education: Goal: Understanding of CV disease, CV risk reduction, and recovery process will improve Outcome: Progressing Goal: Individualized Educational Video(s) Outcome: Progressing   Problem: Activity: Goal: Ability to return to baseline activity level will improve Outcome: Progressing   Problem: Cardiovascular: Goal: Ability to achieve and maintain adequate cardiovascular perfusion will improve Outcome: Progressing Goal: Vascular access site(s) Level 0-1 will be maintained Outcome: Progressing   Problem: Health Behavior/Discharge Planning: Goal: Ability to safely manage health-related needs after discharge will improve Outcome: Progressing   Problem: Education: Goal: Knowledge  of General Education information will improve Description: Including pain rating scale, medication(s)/side effects and non-pharmacologic comfort measures Outcome: Progressing   Problem: Health Behavior/Discharge Planning: Goal: Ability to manage health-related needs will improve Outcome: Progressing   Problem: Clinical Measurements: Goal: Ability to maintain clinical measurements within normal limits will improve Outcome: Progressing Goal: Will remain free from infection Outcome: Progressing Goal: Diagnostic test results will improve Outcome: Progressing Goal: Respiratory complications will improve Outcome: Progressing Goal: Cardiovascular complication will be avoided Outcome: Progressing   Problem: Activity: Goal: Risk for activity intolerance will decrease Outcome: Progressing   Problem: Nutrition: Goal: Adequate nutrition will be maintained Outcome: Progressing   Problem: Coping: Goal: Level of anxiety will decrease Outcome: Progressing   Problem: Elimination: Goal: Will not experience complications related to bowel motility Outcome: Progressing Goal: Will not experience complications related to urinary retention Outcome: Progressing   Problem: Pain Managment: Goal: General experience of comfort will improve and/or be controlled Outcome: Progressing   Problem: Safety: Goal: Ability to remain free from injury will improve Outcome: Progressing   Problem: Skin Integrity: Goal: Risk for impaired skin integrity will decrease Outcome: Progressing

## 2024-03-19 NOTE — TOC CM/SW Note (Signed)
 Transition of Care [facility]) CM/SW Note    Transition of Care [facility]) - Inpatient Brief Assessment   Patient Details  Name: Andrew Harding MRN: [id number] Date of Birth: 1950/07/05  Transition of Care [facility]) CM/SW Contact:    Alfonso Rummer, LCSW Phone Number: 03/19/2024, 12:02 PM   Clinical Narrative: Insurance auth started. [facility] will transition to [facility] once [facility] advises they received ins authorization    Transition of Care Asessment:

## 2024-03-19 NOTE — Progress Notes (Signed)
 Physical Therapy Treatment Patient Details Name: Andrew Harding MRN: [id number] DOB: 1950-06-21 Today's Date: 03/19/2024   History of Present Illness Andrew Harding is a 73yoM who comes to [facility] on 03/11/24 for vascular surgery to address atherosclerotic occlusive disease bilateral lower extremities with ulceration and nonhealing wounds of the right lower extremity. Pt underwent debridement with podiatry 2 days later. Per podiatry patient is to be nonweightbearing on his right lower extremity.    PT Comments  Pt alert, pleasant and agreeable to participate in PT tx this date. Pt was received in bed, able to don/doff L prosthesis with set-up assist. Pt performed bed mobility modI. Pt completed 5 consecutive STS transfers from elevated EOB during session to improve LLE strength and endurance capacity, minA required to stabilize RW due to pt pulling to stand. VC throughout for eccentric control to sitting. Pt demonstrated good adherence to NWB RLE precautions during all transfers this date, able to hop on LLE x2 toward [facility] with CGA and no LOB. BLE HEP reviewed with pt with frequency increased to 2x/day to maximize RLE strength. Pt was left semi-reclined in bed at end of session with all needs in reach. The patient would benefit from further skilled PT intervention to continue to progress towards goals.     If plan is discharge home, recommend the following: Help with stairs or ramp for entrance;Assist for transportation;A lot of help with walking and/or transfers;A lot of help with bathing/dressing/bathroom   Can travel by private vehicle     No  Equipment Recommendations  Other (comment) (TBD at next venue of care)    Recommendations for Other Services       Precautions / Restrictions Precautions Precautions: Fall Recall of Precautions/Restrictions: Intact Precaution/Restrictions Comments: wound VAC RLE, hx of L BKA, has prosthetic Restrictions Weight Bearing Restrictions Per Provider Order:  Yes RLE Weight Bearing Per Provider Order: Non weight bearing     Mobility  Bed Mobility Overal bed mobility: Modified Independent             General bed mobility comments: no physical assistance required for supine <> sit transfers    Transfers Overall transfer level: Needs assistance Equipment used: Rolling walker (2 wheels) Transfers: Sit to/from Stand Sit to Stand: Min assist, From elevated surface           General transfer comment: 5 STS from elevated EOB with minA to stabilize RW due to pt tendency to pull to stand. Good adherence to NWB precautions for all transfers. VC for eccentric control to sitting    Ambulation/Gait Ambulation/Gait assistance: Contact guard assist Gait Distance (Feet): 2 Feet Assistive device: Rolling walker (2 wheels)         General Gait Details: Pt able to take 2 hops with LLE toward HOB with CGA and VC for sequencing and RW positioning.   Stairs             Wheelchair Mobility     Tilt Bed    Modified Rankin (Stroke Patients Only)       Balance Overall balance assessment: Needs assistance Sitting-balance support: Feet supported Sitting balance-Leahy Scale: Good Sitting balance - Comments: steady static and dynamic sitting   Standing balance support: Bilateral upper extremity supported, Reliant on assistive device for balance Standing balance-Leahy Scale: Fair Standing balance comment: heavy BUE support on RW                            Communication  Communication Communication: No apparent difficulties  Cognition Arousal: Alert Behavior During Therapy: WFL for tasks assessed/performed   PT - Cognitive impairments: No apparent impairments                       PT - Cognition Comments: A&Ox4, pleasant and cooperative Following commands: Intact      Cueing Cueing Techniques: Verbal cues  Exercises Other Exercises Other Exercises: Reviewed HEP with pt to ensure compliance,  increased frequency of HEP to 2x/day    General Comments General comments (skin integrity, edema, etc.): LLE ace-wrapping and wound vac intact throughout session. Pt able to don/doff L prosthesis with set-up assist      Pertinent Vitals/Pain Pain Assessment Pain Assessment: No/denies pain    Home Living                          Prior Function            PT Goals (current goals can now be found in the care plan section) Progress towards PT goals: Progressing toward goals    Frequency    Min 3X/week      PT Plan      Co-evaluation              AM-PAC PT 6 Clicks Mobility   Outcome Measure  Help needed turning from your back to your side while in a flat bed without using bedrails?: None Help needed moving from lying on your back to sitting on the side of a flat bed without using bedrails?: None Help needed moving to and from a bed to a chair (including a wheelchair)?: A Lot Help needed standing up from a chair using your arms (e.g., wheelchair or bedside chair)?: A Little Help needed to walk in hospital room?: A Lot Help needed climbing 3-5 steps with a railing? : A Lot 6 Click Score: 17    End of Session Equipment Utilized During Treatment: Gait belt Activity Tolerance: Patient tolerated treatment well Patient left: in bed;with call bell/phone within reach;with bed alarm set Nurse Communication: Mobility status PT Visit Diagnosis: Unsteadiness on feet (R26.81);Other abnormalities of gait and mobility (R26.89);Difficulty in walking, not elsewhere classified (R26.2)     Time: 8668-8652 PT Time Calculation (min) (ACUTE ONLY): 16 min  Charges:    $Therapeutic Activity: 8-22 mins PT General Charges $$ ACUTE PT VISIT: 1 Visit                    Deeric Cruise, SPT

## 2024-03-19 NOTE — Consult Note (Signed)
 WOC consulted for NPWT dressing to the foot. Patient to be DC to SNF today, podiatry has ordered bedside nursing to remove NPWT dressing at DC and apply saline moist gauze dressing for transport. SNF to apply new NPWT dressing when he arrives to the facility.  TOC and team aware.  Emiline Mancebo [facility], CNS, CWON-AP [phone]

## 2024-03-19 NOTE — Progress Notes (Signed)
 Date of Admission:  03/11/2024     ID: Andrew Harding is a 73 y.o. male Principal Problem:   Atherosclerosis of artery of extremity with ulceration (HCC) Active Problems:   Type 2 diabetes mellitus with stage 3b chronic kidney disease, with long-term current use of insulin  (HCC)   Benign essential hypertension   Diabetic foot infection (HCC)   Chronic atrial fibrillation with RVR (HCC)   Allergic reaction   HLD (hyperlipidemia)   CAD (coronary artery disease)    Subjective: Pt doing well No pain  Medications:   apixaban   5 mg Oral BID   aspirin  EC  81 mg Oral Daily   gabapentin   300 mg Oral TID   insulin  aspart  0-15 Units Subcutaneous TID WC   insulin  aspart  0-5 Units Subcutaneous QHS   insulin  glargine-yfgn  14 Units Subcutaneous Q2200   metoprolol  tartrate  12.5 mg Oral BID   rosuvastatin  20 mg Oral Daily   sodium chloride  flush  3 mL Intravenous Q12H   traZODone   50 mg Oral QHS    Objective: Vital signs in last 24 hours: Patient Vitals for the past 24 hrs:  BP Temp Pulse Resp SpO2  03/19/24 1156 123/64 98.4 F (36.9 C) 68 18 99 %  03/19/24 0739 (!) 137/59 97.6 F (36.4 C) 79 15 96 %  03/19/24 0415 107/68 97.8 F (36.6 C) 76 20 97 %  03/19/24 0046 124/77 98 F (36.7 C) 75 17 96 %  03/18/24 1953 119/61 98.2 F (36.8 C) 77 18 98 %  03/18/24 1544 118/71 98 F (36.7 C) 76 16 98 %       PHYSICAL EXAM:  General: Alert, cooperative, no distress, appears stated age.  Lungs: Clear to auscultation bilaterally. No Wheezing or Rhonchi. No rales. Heart:rate well controlled Abdomen: Soft, non-tender,not distended. Bowel sounds normal. No masses Extremities: rt foot- has wound vac Picture reviewed     Skin: No rashes or lesions. Or bruising Lymph: Cervical, supraclavicular normal. Neurologic: Grossly non-focal  Lab Results    Latest Ref Rng & Units 03/16/2024    5:23 AM 03/15/2024    5:48 AM 03/14/2024    9:26 AM  CBC  WBC 4.0 - 10.5 K/uL 6.6  6.8  9.9    Hemoglobin 13.0 - 17.0 g/dL 89.7  89.7  89.6   Hematocrit 39.0 - 52.0 % 31.7  30.1  31.0   Platelets 150 - 400 K/uL 222  212  218        Latest Ref Rng & Units 03/18/2024    4:43 AM 03/17/2024    4:55 AM 03/16/2024    5:23 AM  CMP  Glucose 70 - 99 mg/dL 820  811  864   BUN 8 - 23 mg/dL 11  11  13    Creatinine 0.61 - 1.24 mg/dL 8.82  8.91  8.95   Sodium 135 - 145 mmol/L 139  139  141   Potassium 3.5 - 5.1 mmol/L 3.9  4.1  3.9   Chloride 98 - 111 mmol/L 104  106  106   CO2 22 - 32 mmol/L 25  27  25    Calcium 8.9 - 10.3 mg/dL 8.6  8.5  8.4       Microbiology: WC MSSA  Assessment/Plan: 73 year old male with history of diabetes, peripheral neuropathy, PAD, left BKA, CAD status post CABG, hypertension, A-fib on Eliquis , presents with an ongoing ischemic wound on the lateral margin of the right TMA.  Since May  2025 patient has been having right foot infection.  Initially it right great toe amputation followed by TMA on 11/02/2023 and he was treated with 6 weeks of IV daptomycin  for MRSA and Enterococcus faecalis osteomyelitis.  The wound healing had been complicated by superficial necrosis at the surgical site and patient has had multiple vascular procedures of the right lower extremity.  On 12/21/2023 he had further debridement of the right TMA there were 3 different organisms and he received Levaquin and Augmentin .  He was readmitted from 01/10/2024 until 01/16/2024 and further debridement of TMA site was done.  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  A culture with Enterobacter and Prevotella.  He was discharged on ertapenem  IV and completed 6 weeks of it on 02/21/2019 and PICC line was removed.  He was doing well and was off antibiotics until 03/06/2024 when he was restarted on Levaquin and Augmentin  after culture that was taken by Dr. Ashley in his office. culture was MSSA.  Patient is readmitted for further debridement of an eschar on the lateral margin as well as for vascular  procedure  New ischemic eschar on the lateral margin of the right foot. Recurrent infection of the lateral end of the surgical site Patient underwent irrigation and debridement on also excision of the fifth metatarsal on 03/13/2024. Surgical cultures so far is Staph aureus On cefazolin  and tolerating well Spoke to Dr.Rubinas-bone margin has osteomylitis so will continue IV cefazolin  for MSSA infection for 6 weeks Will be able to switch to PO after 4 weeks of IV if he is progressing well   PAD status post balloon angioplasty of the right anterior tibial artery  Cefepime  caused tachycardia and burning and itching reaction It was discontinued.  And Benadryl  was given which resolved the symptoms Diabetes mellitus on insulin   CAD status post CABG On Crestor and metoprolol  Discussed the management with the patient.  Discussed the management with vascular team and podiatrist and ID pharmacist I will se ehim on 04/01/24 in my clinic

## 2024-03-20 ENCOUNTER — Inpatient Hospital Stay

## 2024-03-20 LAB — GLUCOSE, CAPILLARY
Glucose-Capillary: 173 mg/dL — ABNORMAL HIGH (ref 70–99)
Glucose-Capillary: 165 mg/dL — ABNORMAL HIGH (ref 70–99)
Glucose-Capillary: 167 mg/dL — ABNORMAL HIGH (ref 70–99)

## 2024-03-20 MED ORDER — ACETAMINOPHEN 325 MG PO TABS: 650.0000 mg | ORAL_TABLET | ORAL | 0 refills | Status: AC | PRN

## 2024-03-20 MED ORDER — CEFAZOLIN IV (FOR PTA / DISCHARGE USE ONLY): 2.0000 g | Freq: Three times a day (TID) | INTRAVENOUS | 0 refills | Status: DC

## 2024-03-20 MED ORDER — SODIUM CHLORIDE 0.9% FLUSH: 10.0000 mL | Freq: Two times a day (BID) | INTRAVENOUS | Status: DC

## 2024-03-20 MED ORDER — SODIUM CHLORIDE 0.9% FLUSH: 10.0000 mL | INTRAVENOUS | Status: DC | PRN

## 2024-03-20 MED ORDER — OXYCODONE HCL 5 MG PO TABS: 5.0000 mg | ORAL_TABLET | ORAL | 0 refills | Status: DC | PRN

## 2024-03-20 MED ORDER — ASPIRIN 81 MG PO TBEC: 81.0000 mg | DELAYED_RELEASE_TABLET | Freq: Every day | ORAL | 12 refills | Status: AC

## 2024-03-20 MED ORDER — CHLORHEXIDINE GLUCONATE CLOTH 2 % EX PADS
6.0000 | MEDICATED_PAD | Freq: Every day | CUTANEOUS | Status: DC
  Administered 2024-03-20: 6 via TOPICAL

## 2024-03-20 NOTE — TOC Progression Note (Signed)
 Transition of Care [facility]) - Progression Note    Patient Details  Name: Andrew Harding MRN: [id number] Date of Birth: [birth year]  Transition of Care [facility]) CM/SW Contact  Shasta DELENA Daring, RN Phone Number: 03/20/2024, 11:53 AM  Clinical Narrative:     Confirmed authorization with [facility] and that wound vac has been delivered. Patient bed will be ready at 5 pm. Patient has been made aware that he has 16 days of service left.   Expected Discharge Plan: Skilled Nursing Facility Barriers to Discharge: Continued Medical Work up               Expected Discharge Plan and Services     Post Acute Care Choice: Skilled Nursing Facility Living arrangements for the past 2 months: Single Family Home                                       Social Drivers of Health (SDOH) Interventions SDOH Screenings   Food Insecurity: No Food Insecurity (03/12/2024)  Housing: Low Risk  (03/12/2024)  Transportation Needs: No Transportation Needs (03/12/2024)  Utilities: Not At Risk (03/12/2024)  Depression (PHQ2-9): Low Risk  (05/03/2023)  Financial Resource Strain: Low Risk  (02/22/2024)   Received from [facility]  Social Connections: Unknown (03/12/2024)  Recent Concern: Social Connections - Moderately Isolated (01/10/2024)  Tobacco Use: Medium Risk (03/13/2024)    Readmission Risk Interventions    03/14/2024    2:43 PM 01/14/2024   10:52 AM 02/28/2023   12:53 PM  Readmission Risk Prevention Plan  Transportation Screening Complete Complete Complete  PCP or Specialist Appt within 3-5 Days Complete Complete Complete  HRI or Home Care Consult  Complete Complete  Social Work Consult for Recovery Care Planning/Counseling Complete Complete Complete  Palliative Care Screening Not Applicable Not Applicable Not Applicable  Medication Review Oceanographer) Complete Complete Complete

## 2024-03-20 NOTE — Discharge Summary (Signed)
 [facility]    Discharge Summary    Patient ID:  Andrew Harding MRN: [id number] DOB/AGE: 1950-10-23 73 y.o.  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) 09/03/2023   Atrial fibrillation with RVR (HCC) 08/19/2022   Bladder neck obstruction    Cellulitis 08/17/2022   Chronic kidney disease    Coronary artery disease    a.) s/p 4v CABG in 2014   Diabetes mellitus without complication (HCC)    Diabetic neuropathy (HCC)    Diabetic peripheral neuropathy (HCC)    Diabetic ulcer of toe of left foot associated with diabetes mellitus of other type, limited to breakdown of skin (HCC) 12/21/2017   Diverticulosis    Gout    Gram-negative bacteremia 05/11/2023   Heart murmur    History of osteomyelitis 05/31/2015   Hypercholesteremia    Hyperlipidemia    Hypertension    Hypotension due to hypovolemia 08/17/2022   Infection of left foot 08/19/2022   MSSA bacteremia 02/28/2023   Open wound of left foot with complication 05/10/2023   Osteomyelitis (HCC) 05/31/2015   Peripheral neuropathy    Postural dizziness with presyncope 02/26/2023   S/P BKA (below knee amputation) unilateral, 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 He has a wound on his right lower extremity which has been very slow to heal. He has been getting excellent wound. Podiatry but despite these he has not shown significant improvement. He has podiatrist is hoping that we will be able to do hyperbaric oxygen therapy to try to help heal the wound. The patient has a history of a left below-knee amputation. He had a recent intervention on the right lower extremity on 01/15/2024 angioplasty and stent placement to the right popliteal and anterior tibial arteries. Today having noninvasive studies which shows a noncompressible ABI on the right. Arterial duplex shows primarily triphasic waveforms throughout the right lower extremity but monophasic waveforms in the tibial vessels.   Hospital Course:  [name] is a 73 y.o. male is S/P Right excision fifth metatarsal right foot with application of wound vac.   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  Introduction catheter into right lower extremity 3rd order catheter placement with additional third  order catheter placement             2.  Contrast injection right lower extremity for distal runoff with additional 3rd order             3.  Percutaneous transluminal angioplasty right anterior tibial artery                          4.  Star close closure left common femoral arteriotomy   Operative note: 03/13/2024   POD #5     Surgeon:Justin Ashley     Assistant: None     Preop diagnosis: Necrotic ulcer lateral right foot     Postop diagnosis: Same     Procedure: 1 excision fifth metatarsal right foot 2.  Application of wound VAC right foot 3.  Intraoperative fluoroscopy without assistance of radiologist Patient is recovering as expected.  Patient had a reaction to an IV antibiotic last night.  He was given Benadryl  IV and all the symptoms resolved 20 minutes later.  No complications to note.  No complications overnight. Patient endorses leg feels much better.  Per podiatry patient is to be nonweightbearing on his right lower extremity.  Vitals are remained stable.   Patient is being discharged today on aspirin  81 mg daily, Eliquis  5 mg twice daily and Crestor 20 mg daily.  Patient was informed not to miss or skip taking his medications as it will interfere with the outcome of your procedure and surgery.  Patient verbalizes understanding.  I spent greater than 60 minutes in developing, implementing, teaching and discharging this patient today.   Extubated: POD # 0 Physical Exam:  Alert notes x3, no acute distress Face: Symmetrical.  Tongue is midline. Neck: Trachea is midline.  No swelling or bruising. Cardiovascular: Regular rate and rhythm Pulmonary: Clear to auscultation bilaterally Abdomen: Soft, nontender, nondistended Right groin access: Clean dry and intact.  No swelling or drainage noted Left groin access: Clean dry and intact.  No swelling or drainage noted Left lower extremity: Thigh soft.  Calf soft.  Extremities warm distally toes.  Hard to palpate pedal pulses however the foot is warm is her good capillary refill. Right lower extremity: Thigh soft.  Calf soft.  Extremities warm distally toes.  Hard to palpate pedal pulses however the foot is warm is her good capillary refill. Neurological: No deficits noted   Post-op wounds:  clean, dry, intact or healing well with the use of a wound vac.    Pt. Is Non Ambulatory, voiding and taking PO diet without difficulty. Pt pain controlled with PO pain meds.  Labs:  As below  Complications: none  Consults:  Treatment Team:  [name], DPM Trudy Anthony HERO, MD Maree Hue, MD  Significant Diagnostic Studies: CBC Lab Results  Component Value Date   WBC 6.6 03/16/2024   HGB 10.2 (L) 03/16/2024   HCT 31.7 (L) 03/16/2024   MCV 85.2 03/16/2024   PLT 222 03/16/2024    BMET    Component Value Date/Time   NA 139 03/18/2024 0443   K 3.9 03/18/2024 0443   CL 104 03/18/2024 0443   CO2 25 03/18/2024 0443   GLUCOSE 179 (H) 03/18/2024 0443   GLUCOSE 402 (H) 09/17/2012 1417   BUN 11 03/18/2024 0443   CREATININE 1.17 03/18/2024 0443   CALCIUM 8.6 (L) 03/18/2024 0443   GFRNONAA >60 03/18/2024 0443   GFRAA 58 (L) 06/02/2015 0656   COAG Lab Results  Component Value  Date   INR 1.2 01/12/2024   INR 1.3 (H) 05/10/2023   INR 1.6 (H) 02/27/2023     Disposition:  Discharge to :Rehab Discharge Instructions     Change dressing on IV access line weekly and PRN   Complete by: As directed    Flush IV access with Sodium Chloride  0.9% and Heparin  10 units/ml or 100 units/ml   Complete by: As directed    Home infusion instructions - [facility]   Complete by: As directed    Instructions: Flush IV access with Sodium Chloride  0.9% and Heparin  10units/ml or 100units/ml   Change dressing on IV access line: Weekly and PRN   Instructions Cath Flo 2mg : Administer for PICC Line occlusion and as ordered by physician for other access device   [facility] pharmacist to adjust dose for: Vancomycin , Aminoglycosides and other anti-infective therapies as requested by physician      Allergies as of 03/20/2024       Reactions   Cefepime  Itching        Medication List     STOP taking these medications    amoxicillin -clavulanate 875-125 MG tablet Commonly known as: AUGMENTIN        TAKE these  medications    acetaminophen  325 MG tablet Commonly known as: TYLENOL  Take 2 tablets (650 mg total) by mouth every 4 (four) hours as needed for headache or mild pain (pain score 1-3). What changed:  when to take this reasons to take this   aspirin  EC 81 MG tablet Take 1 tablet (81 mg total) by mouth daily. Swallow whole.   ceFAZolin  IVPB Commonly known as: ANCEF  Inject 2 g into the vein every 8 (eight) hours. Indication:  MSSA foot osteomyelitis Last Day of Therapy:  04/24/24 Labs - Once weekly:  CBC/D and CMP, Labs - Once weekly: ESR and CRP Fax weekly lab results  promptly to [phone] & [phone] Method of administration: IV Push Method of administration may be changed at the discretion of the facility and/or its pharmacy. Please pull PIC at completion of IV antibiotics  Call [phone] with any critical value or questions   cyanocobalamin  1000 MCG tablet Commonly known as: VITAMIN B12 Take 1 tablet (1,000 mcg total) by mouth daily.   Eliquis  5 MG Tabs tablet Generic drug: apixaban  TAKE 1 TABLET(5 MG) BY MOUTH TWICE DAILY   Fifty50 Pen Needles 32G X 4 MM Misc Generic drug: Insulin  Pen Needle USE 4 TIMES DAILY   FreeStyle Libre 2 Sensor Misc   gabapentin  300 MG capsule Commonly known as: NEURONTIN  Take 1 capsule (300 mg total) by mouth 3 (three) times daily.   insulin  glargine 100 UNIT/ML Solostar Pen Commonly known as: LANTUS  Inject 28 Units into the skin at bedtime. What changed: when to take this   metFORMIN  500 MG 24 hr tablet Commonly known as: GLUCOPHAGE -XR Take 1,000 mg by mouth 2 (two) times daily with a meal.   metoprolol  tartrate 25 MG tablet Commonly known as: LOPRESSOR  Take 0.5 tablets (12.5 mg total) by mouth 2 (two) times daily.   OneTouch Ultra test strip Generic drug: glucose blood Use 3 (three) times daily   oxyCODONE  5 MG immediate release tablet Commonly known as: Oxy IR/ROXICODONE  Take 1-2 tablets (5-10 mg total) by mouth  every 4 (four) hours as needed for moderate pain (pain score 4-6) or severe pain (pain score 7-10).   Ozempic (1 MG/DOSE) 4 MG/3ML Sopn Generic drug: Semaglutide (1 MG/DOSE) Inject 1 mg into the skin once  a week.   pravastatin  80 MG tablet Commonly known as: PRAVACHOL  Take 80 mg by mouth daily.   traZODone  50 MG tablet Commonly known as: DESYREL  Take 50 mg by mouth at bedtime.   Trulicity  0.75 MG/0.5ML Soaj Generic drug: Dulaglutide  Inject 0.75 mg into the skin once a week. Every Monday               Discharge Care Instructions  (From admission, onward)           Start     Ordered   03/20/24 0000  Change dressing on IV access line weekly and PRN  (Home infusion instructions - [facility] )        03/20/24 1204           Verbal and written Discharge instructions given to the patient. Wound care per Discharge AVS  Contact information for follow-up providers     Schnier, Cordella MATSU, MD Follow up in 6 week(s).   Specialties: Vascular Surgery, Cardiology, Radiology, Vascular Surgery Why: Right Lower Extremity Duplex ultrasound with ABI Contact information: [address] [place] [address] [phone]         [name], DPM Follow up in 2 week(s).   Specialty: Podiatry Why: Wound Check. Please send patient with wound vac supplies for wound vac change Contact information: [address] [place] [address] [phone]              Contact information for after-discharge care     Destination     [facility]   Service: Skilled Nursing Contact information: [address] [place]  [address] [phone]                     Signed: [name], NP  03/20/2024, 12:12 PM

## 2024-03-20 NOTE — Plan of Care (Signed)
  Problem: Education: Goal: Knowledge of General Education information will improve Description: Including pain rating scale, medication(s)/side effects and non-pharmacologic comfort measures Outcome: Progressing   Problem: Health Behavior/Discharge Planning: Goal: Ability to manage health-related needs will improve Outcome: Progressing   Problem: Clinical Measurements: Goal: Will remain free from infection Outcome: Progressing   Problem: Clinical Measurements: Goal: Respiratory complications will improve Outcome: Progressing   Problem: Clinical Measurements: Goal: Cardiovascular complication will be avoided Outcome: Progressing   Problem: Elimination: Goal: Will not experience complications related to bowel motility Outcome: Progressing   Problem: Elimination: Goal: Will not experience complications related to urinary retention Outcome: Progressing   Problem: Pain Managment: Goal: General experience of comfort will improve and/or be controlled Outcome: Progressing   Problem: Safety: Goal: Ability to remain free from injury will improve Outcome: Progressing

## 2024-03-20 NOTE — TOC Transition Note (Signed)
 Transition of Care [facility]) - Discharge Note   Patient Details  Name: Andrew Harding MRN: [id number] Date of Birth: 30-Apr-1951  [facility] CM/SW Contact:  Shasta DELENA Daring, RN Phone Number: 03/20/2024, 2:55 PM   Clinical Narrative:    Patient accepted rehab placement at [facility]. Confirmed with [facility] that wound vac was delivered today and that the will continue on IV abx after discharge. Phone number for report provided to bedside RN. Transportation will be provided by [facility]. Patient notified of discharge. Denied further questions. No additional TOC needs identified. RNCM signing off.   Final next level of care: Skilled Nursing Facility Barriers to Discharge: Barriers Resolved   Patient Goals and CMS Choice            Discharge Placement   Existing PASRR number confirmed : 03/14/24          Patient chooses bed at: [facility] Patient to be transferred to facility by: [facility] Name of family member notified: Patient will notify Patient and family notified of of transfer: 03/20/24  Discharge Plan and Services Additional resources added to the After Visit Summary for       Post Acute Care Choice: Skilled Nursing Facility                               Social Drivers of Health (SDOH) Interventions SDOH Screenings   Food Insecurity: No Food Insecurity (03/12/2024)  Housing: Low Risk  (03/12/2024)  Transportation Needs: No Transportation Needs (03/12/2024)  Utilities: Not At Risk (03/12/2024)  Depression (PHQ2-9): Low Risk  (05/03/2023)  Financial Resource Strain: Low Risk  (02/22/2024)   Received [facility]  Social Connections: Unknown (03/12/2024)  Recent Concern: Social Connections - Moderately Isolated (01/10/2024)  Tobacco Use: Medium Risk (03/13/2024)     Readmission Risk Interventions    03/14/2024    2:43 PM 01/14/2024   10:52 AM 02/28/2023   12:53 PM  Readmission Risk Prevention Plan  Transportation  Screening Complete Complete Complete  PCP or Specialist Appt within 3-5 Days Complete Complete Complete  HRI or Home Care Consult  Complete Complete  Social Work Consult for Recovery Care Planning/Counseling Complete Complete Complete  Palliative Care Screening Not Applicable Not Applicable Not Applicable  Medication Review Oceanographer) Complete Complete Complete

## 2024-03-20 NOTE — Consult Note (Signed)
 WOC Nurse wound follow up Wound type: Peripheral arterial disease (PAD). Surgical debridement. Wound vac dressing changed per NP Pace.  NPWT next change MON.  WOC team will follow further.  Please reconsult if further assistance is needed. Thank-you,  Lela Holm MSN, RN, CNS.  (Phone [phone])

## 2024-03-20 NOTE — Progress Notes (Signed)
 Peripherally Inserted Central Catheter Placement  The IV Nurse has discussed with the patient and/or persons authorized to consent for the patient, the purpose of this procedure and the potential benefits and risks involved with this procedure.  The benefits include less needle sticks, lab draws from the catheter, and the patient may be discharged home with the catheter. Risks include, but not limited to, infection, bleeding, blood clot (thrombus formation), and puncture of an artery; nerve damage and irregular heartbeat and possibility to perform a PICC exchange if needed/ordered by physician.  Alternatives to this procedure were also discussed.  Bard Power PICC patient education guide, fact sheet on infection prevention and patient information card has been provided to patient /or left at bedside.    PICC Placement Documentation  PICC Single Lumen 03/20/24 Right Basilic 40 cm 0 cm (Active)  Indication for Insertion or Continuance of Line Home intravenous therapies (PICC only) 03/20/24 1031  Exposed Catheter (cm) 0 cm 03/20/24 1031  Site Assessment Clean, Dry, Intact 03/20/24 1031  Line Status Flushed;Saline locked;Blood return noted 03/20/24 1031  Dressing Type Transparent;Securing device 03/20/24 1031  Dressing Status Antimicrobial disc/dressing in place;Clean, Dry, Intact 03/20/24 1031  Line Care Connections checked and tightened 03/20/24 1031  Line Adjustment (NICU/IV Team Only) No 03/20/24 1031  Dressing Change Due 03/27/24 03/20/24 1031       Bonni Rock Larve 03/20/2024, 10:33 AM

## 2024-03-20 NOTE — Progress Notes (Signed)
 Patient is discharging by stretcher to [facility] at this time. Denies pain as well as discomfort. Vital signs stable. Alert and oriented times 4.

## 2024-03-25 ENCOUNTER — Ambulatory Visit: Admitting: Infectious Diseases

## 2024-04-01 ENCOUNTER — Ambulatory Visit: Attending: Infectious Diseases | Admitting: Infectious Diseases

## 2024-04-01 ENCOUNTER — Encounter: Payer: Self-pay | Admitting: Infectious Diseases

## 2024-04-01 VITALS — BP 102/60 | HR 76 | Temp 98.2°F

## 2024-04-01 DIAGNOSIS — I4891 Unspecified atrial fibrillation: Secondary | ICD-10-CM | POA: Insufficient documentation

## 2024-04-01 DIAGNOSIS — E11621 Type 2 diabetes mellitus with foot ulcer: Secondary | ICD-10-CM | POA: Insufficient documentation

## 2024-04-01 DIAGNOSIS — E11628 Type 2 diabetes mellitus with other skin complications: Secondary | ICD-10-CM | POA: Insufficient documentation

## 2024-04-01 DIAGNOSIS — Z951 Presence of aortocoronary bypass graft: Secondary | ICD-10-CM | POA: Insufficient documentation

## 2024-04-01 DIAGNOSIS — L089 Local infection of the skin and subcutaneous tissue, unspecified: Secondary | ICD-10-CM | POA: Insufficient documentation

## 2024-04-01 DIAGNOSIS — I2510 Atherosclerotic heart disease of native coronary artery without angina pectoris: Secondary | ICD-10-CM | POA: Insufficient documentation

## 2024-04-01 DIAGNOSIS — E1122 Type 2 diabetes mellitus with diabetic chronic kidney disease: Secondary | ICD-10-CM | POA: Insufficient documentation

## 2024-04-01 DIAGNOSIS — Z7984 Long term (current) use of oral hypoglycemic drugs: Secondary | ICD-10-CM

## 2024-04-01 DIAGNOSIS — Z8614 Personal history of Methicillin resistant Staphylococcus aureus infection: Secondary | ICD-10-CM | POA: Insufficient documentation

## 2024-04-01 DIAGNOSIS — Z89411 Acquired absence of right great toe: Secondary | ICD-10-CM | POA: Diagnosis not present

## 2024-04-01 DIAGNOSIS — Z7901 Long term (current) use of anticoagulants: Secondary | ICD-10-CM | POA: Insufficient documentation

## 2024-04-01 DIAGNOSIS — Z794 Long term (current) use of insulin: Secondary | ICD-10-CM

## 2024-04-01 DIAGNOSIS — B9561 Methicillin susceptible Staphylococcus aureus infection as the cause of diseases classified elsewhere: Secondary | ICD-10-CM

## 2024-04-01 DIAGNOSIS — I739 Peripheral vascular disease, unspecified: Secondary | ICD-10-CM | POA: Diagnosis not present

## 2024-04-01 DIAGNOSIS — I129 Hypertensive chronic kidney disease with stage 1 through stage 4 chronic kidney disease, or unspecified chronic kidney disease: Secondary | ICD-10-CM | POA: Insufficient documentation

## 2024-04-01 DIAGNOSIS — N189 Chronic kidney disease, unspecified: Secondary | ICD-10-CM | POA: Insufficient documentation

## 2024-04-01 DIAGNOSIS — Z89421 Acquired absence of other right toe(s): Secondary | ICD-10-CM

## 2024-04-01 DIAGNOSIS — Z7985 Long-term (current) use of injectable non-insulin antidiabetic drugs: Secondary | ICD-10-CM

## 2024-04-01 DIAGNOSIS — E1142 Type 2 diabetes mellitus with diabetic polyneuropathy: Secondary | ICD-10-CM | POA: Insufficient documentation

## 2024-04-01 DIAGNOSIS — E1151 Type 2 diabetes mellitus with diabetic peripheral angiopathy without gangrene: Secondary | ICD-10-CM | POA: Insufficient documentation

## 2024-04-01 MED ORDER — CEFADROXIL 500 MG PO CAPS: 1000.0000 mg | ORAL_CAPSULE | Freq: Two times a day (BID) | ORAL | 1 refills | Status: DC

## 2024-04-01 NOTE — Progress Notes (Signed)
 NAME: Andrew Harding  DOB: [birth year]  MRN: [id number]  Date/Time: 04/01/2024 12:26 PM   Subjective:  Here with his friend Follow up after recent hospitalization between 03/11/24-03/20/24 when he had 5th metatrsal excision on 03/13/24- MSSA in culture was sent to [facility] on cefazolin  for 6 weeks until 04/24/24- He is doing okay Not seen Dr.Fowler since discharge He wants to finish IV antibiotic a few days earlier than 12/25 He is concerned about the heel pressure area  Andrew Harding is a 73 y.o. male with a history of diabetes mellitus, peripheral neuropathy, PAD left BKA ,CAD status post CABG, hypertension,  A-fib on Eliquis , right great toe amputation  Past infectious history Patient  in the month of May 2025   underwent excision first metatarsal right foot along with excision of the sesamoid  right first MTP joint by Dr. Ashley..  Culture then was MRSA.  Patient placed on  Bactrim   Patient at that time also underwent angio and had angioplasty and stent placement to the right anterior tibial artery, percutaneous transluminal angioplasty of the right mid popliteal artery and the right tibioperoneal trunk.. As the wound progressed he underwent TMA on 11/02/23 Pathology acute osteo but margin clear of osteo- culture MRSA/ enterococcus fecalis - decided to treat with 4 weeks of IV dapto which were extended by 2 more weeks to complete 6 weeks on 12/14/2023 The wound healing has been complicated by superficial necrosis of the surgical site and also some dehiscence He also had developed 2 pressure ulcers on the malleolus from the cam boot.  This has gotten better since he removed the cam boot. HE had debridement on 8/22- culture was enterobacter , pseudeschericia, and granulocutela- He was placed on augmentin  and levaquin .He was readmitted from 01/10/2024 until 01/16/2024 and further debridement of TMA site was done. 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 A culture with Enterobacter and Prevotella. He  underwent angio on 01/15/2024 and the findings were as below The right common femoral was widely patent as was the profunda femoris.  The SFA was widely patent With 3 lesions that were hemodynamically significant and these were in the mid popliteal area as well as the ostial lesion in the anterior tibial artery.  The critical lesion in the mid anterior tibial artery appears to be more of a hyperplastic response when compared to the previous study.  The previously placed stent in the TP trunk was widely patent Peroneal is patent down to the ankle but does not seem to contribute to the foot much. Posterior tibial was patent proximally but demonstrated diffuse disease which is rather extensive in its midportion and then its distal one third is occluded and there is minimal if any reconstitution of the plantar arteries. Following angioplasty and stent placement in 2 locations of the anterior tibial there was inline flow and looked quite nice with less than 10% residual stenosis.  Angioplasty and stent placement of the mid popliteal yielded an excellent result with less than 10% residual stenosis so there was successful recannulization of the right lower extremity for limb salvage.  He was discharged on ertapenem  IV and completed 6 weeks of it on 02/21/2024 and PICC line was removed. He was doing well and was off antibiotics until 03/06/2024 when he was restarted on Levaquin and Augmentin  after culture that was taken by Dr. Ashley in his office. culture was MSSA .   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 (HCC) 09/03/2023   Atrial fibrillation with RVR (HCC) 08/19/2022   Bladder neck obstruction    Cellulitis 08/17/2022   Chronic kidney disease    Coronary artery disease    a.) s/p 4v CABG in 2014   Diabetes mellitus without complication (HCC)    Diabetic neuropathy (HCC)    Diabetic peripheral neuropathy (HCC)    Diabetic ulcer of toe of left foot associated with diabetes mellitus of other type, limited to breakdown of skin (HCC) 12/21/2017   Diverticulosis    Gout    Gram-negative bacteremia 05/11/2023   Heart murmur    History of osteomyelitis 05/31/2015   Hypercholesteremia    Hyperlipidemia    Hypertension    Hypotension due to hypovolemia 08/17/2022   Infection of left foot 08/19/2022   MSSA bacteremia 02/28/2023   Open wound of left foot with complication 05/10/2023   Osteomyelitis (HCC) 05/31/2015   Peripheral neuropathy    Postural dizziness with presyncope 02/26/2023   S/P BKA (below knee amputation) unilateral, 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   S/P transmetatarsal amputation of foot, left (HCC) 08/29/2022   Sepsis (HCC) 08/17/2022   Sepsis (HCC) 05/10/2023   Status post amputation of toe of right foot 11/01/2015   Tubular adenoma    Vitamin D  deficiency     Past Surgical History:  Procedure Laterality Date   ABDOMINAL AORTOGRAM W/LOWER EXTREMITY N/A 11/05/2023   Procedure: ABDOMINAL AORTOGRAM W/LOWER EXTREMITY;  Surgeon: Marea Selinda RAMAN, MD;  Location: [facility];  Service: Cardiovascular;  Laterality: N/A;   ACHILLES TENDON SURGERY Right 09/14/2023   Procedure: TENOTOMY, ACHILLES;  Surgeon: [name], DPM;  Location: [facility];  Service: Orthopedics/Podiatry;  Laterality: Right;   AMPUTATION Left 08/19/2022   Procedure: TRANSMETATARSAL AMPUTATION LEFT FOOT WITH IRRIGATION AND DEBRIDEMENT;  Surgeon: Lennie Barter, DPM;  Location: [facility];  Service: Podiatry;  Laterality: Left;   AMPUTATION Left 05/16/2023    Procedure: AMPUTATION BELOW KNEE;  Surgeon: Marea Selinda RAMAN, MD;  Location: [facility];  Service: General;  Laterality: Left;   AMPUTATION Right 09/14/2023   Procedure: AMPUTATION, FOOT, RAY;  Surgeon: [name], DPM;  Location: [facility];  Service: Orthopedics/Podiatry;  Laterality: Right;   AMPUTATION TOE Right 06/01/2015   Procedure: AMPUTATION TOE;  Surgeon: Donnice Cory, DPM;  Location: [facility];  Service: Podiatry;  Laterality: Right;   AMPUTATION TOE Left 08/11/2022   Procedure: AMPUTATION TOE 2, 3, 4;  Surgeon: [name], DPM;  Location: [facility];  Service: Podiatry;  Laterality: Left;   APPLICATION OF WOUND VAC Right 03/13/2024   Procedure: APPLICATION, WOUND VAC;  Surgeon: [name], DPM;  Location: [facility];  Service: Orthopedics/Podiatry;  Laterality: Right;   CATARACT EXTRACTION, BILATERAL     CIRCUMCISION N/A 06/12/2022   Procedure: CIRCUMCISION ADULT;  Surgeon: Penne Knee, MD;  Location: [facility];  Service: Urology;  Laterality: N/A;   COLONOSCOPY WITH PROPOFOL  N/A 02/14/2016   Procedure: COLONOSCOPY WITH PROPOFOL ;  Surgeon: Gladis RAYMOND Mariner, MD;  Location: [facility];  Service: Endoscopy;  Laterality: N/A;   COLONOSCOPY WITH PROPOFOL  N/A  01/07/2019   Procedure: COLONOSCOPY WITH PROPOFOL ;  Surgeon: Gaylyn Gladis PENNER, MD;  Location: [facility] END[facility];  Service: Endoscopy;  Laterality: N/A;   CORONARY ARTERY BYPASS GRAFT N/A 08/2012   EXCISION PARTIAL PHALANX Right 06/01/2015   Procedure: EXCISION PARTIAL PHALANX /  BONE;  Surgeon: Donnice Cory, DPM;  Location: [facility];  Service: Podiatry;  Laterality: Right;   FLEXIBLE SIGMOIDOSCOPY N/A 05/29/2016   Procedure: FLEXIBLE SIGMOIDOSCOPY;  Surgeon: Gladis PENNER Gaylyn, MD;  Location: [facility] ENDOSCOPY;  Service: Endoscopy;  Laterality: N/A;   INCISION AND DRAINAGE Left 08/23/2022   Procedure: INCISION AND DRAINAGE;  Surgeon: [name], DPM;  Location: [facility];  Service: Podiatry;  Laterality: Left;   INCISION AND  DRAINAGE OF WOUND Left 09/15/2022   Procedure: 11044 - DEBRIDE BONE and EXCISION IF 1ST METATARSAL BONE WITH  DELAY PRIMARY CLOSURE;  Surgeon: [name], DPM;  Location: [facility];  Service: Podiatry;  Laterality: Left;   IRRIGATION AND DEBRIDEMENT FOOT Left 02/28/2023   Procedure: IRRIGATION AND DEBRIDEMENT FOOT;  Surgeon: [name], DPM;  Location: [facility];  Service: Orthopedics/Podiatry;  Laterality: Left;   IRRIGATION AND DEBRIDEMENT FOOT Right 01/11/2024   Procedure: IRRIGATION AND DEBRIDEMENT FOOT;  Surgeon: [name], DPM;  Location: [facility];  Service: Orthopedics/Podiatry;  Laterality: Right;   IRRIGATION AND DEBRIDEMENT FOOT Right 03/13/2024   Procedure: IRRIGATION AND DEBRIDEMENT FOOT;  Surgeon: [name], DPM;  Location: [facility];  Service: Orthopedics/Podiatry;  Laterality: Right;   KNEE ARTHROSCOPY Left    LOWER EXTREMITY ANGIOGRAPHY Left 08/22/2022   Procedure: Lower Extremity Angiography;  Surgeon: Jama Cordella MATSU, MD;  Location: [facility];  Service: Cardiovascular;  Laterality: Left;   LOWER EXTREMITY ANGIOGRAPHY Left 05/11/2023   Procedure: Lower Extremity Angiography;  Surgeon: Marea Selinda RAMAN, MD;  Location: [facility];  Service: Cardiovascular;  Laterality: Left;   LOWER EXTREMITY ANGIOGRAPHY Right 09/11/2023   Procedure: Lower Extremity Angiography;  Surgeon: Jama Cordella MATSU, MD;  Location: [facility];  Service: Cardiovascular;  Laterality: Right;   LOWER EXTREMITY ANGIOGRAPHY Right 01/15/2024   Procedure: Lower Extremity Angiography;  Surgeon: Jama Cordella MATSU, MD;  Location: [facility];  Service: Cardiovascular;  Laterality: Right;   LOWER EXTREMITY ANGIOGRAPHY Left 03/11/2024   Procedure: Lower Extremity Angiography;  Surgeon: Jama Cordella MATSU, MD;  Location: [facility];  Service: Cardiovascular;  Laterality: Left;   LOWER EXTREMITY INTERVENTION Right 09/11/2023   Procedure: LOWER EXTREMITY  INTERVENTION;  Surgeon: Jama Cordella MATSU, MD;  Location: [facility];  Service: Cardiovascular;  Laterality: Right;   TEE WITHOUT CARDIOVERSION N/A 03/01/2023   Procedure: TRANSESOPHAGEAL ECHOCARDIOGRAM;  Surgeon: Alluri, Keller BROCKS, MD;  Location: [facility];  Service: Cardiovascular;  Laterality: N/A;   TRANSMETATARSAL AMPUTATION Right 11/02/2023   Procedure: AMPUTATION, FOOT, TRANSMETATARSAL;  Surgeon: [name], DPM;  Location: [facility];  Service: Orthopedics/Podiatry;  Laterality: Right;   TRANSMETATARSAL AMPUTATION Right 12/21/2023   Procedure: REVISION, AMPUTATION, FOOT, TRANSMETATARSAL;  Surgeon: [name], DPM;  Location: [facility];  Service: Orthopedics/Podiatry;  Laterality: Right;   WOUND DEBRIDEMENT Left 09/15/2022   Procedure: 11043 - DEBRIDE SKIN. MUSCLE FASCIA;  Surgeon: [name], DPM;  Location: [facility];  Service: Podiatry;  Laterality: Left;    Social History   Socioeconomic History   Marital status: Divorced    Spouse name: Ashley,Angela C   Number of children: Not on file   Years of education: Not on file   Highest education level: Not on  file  Occupational History   Not on file  Tobacco Use   Smoking status: Former    Types: Cigars   Smokeless tobacco: Never  Vaping Use   Vaping status: Never Used  Substance and Sexual Activity   Alcohol use: No   Drug use: No   Sexual activity: Never  Other Topics Concern   Not on file  Social History Narrative   Patient currently resides at    [facility]   [address]  [place], [place]  [phone]      Patient is legally separated and lives at home by himself. His ex -wife is his HCPOA. Neighbors and friends are his support system. He used to work for [employer]    Social Drivers of Corporate Investment Banker Strain: Low Risk  (02/22/2024)   Received from [facility]   Overall Financial Resource Strain (CARDIA)    Difficulty of Paying Living  Expenses: Not hard at all  Food Insecurity: No Food Insecurity (03/12/2024)   Hunger Vital Sign    Worried About Running Out of Food in the Last Year: Never true    Ran Out of Food in the Last Year: Never true  Transportation Needs: No Transportation Needs (03/12/2024)   PRAPARE - Administrator, Civil Service (Medical): No    Lack of Transportation (Non-Medical): No  Physical Activity: Not on file  Stress: Not on file  Social Connections: Unknown (03/12/2024)   Social Connection and Isolation Panel    Frequency of Communication with Friends and Family: Three times a week    Frequency of Social Gatherings with Friends and Family: Twice a week    Attends Religious Services: 1 to 4 times per year    Active Member of [employer] or Organizations: Yes    Attends Banker Meetings: 1 to 4 times per year    Marital Status: Patient declined  Recent Concern: Social Connections - Moderately Isolated (01/10/2024)   Social Connection and Isolation Panel    Frequency of Communication with Friends and Family: More than three times a week    Frequency of Social Gatherings with Friends and Family: Twice a week    Attends Religious Services: More than 4 times per year    Active Member of [employer] or Organizations: No    Attends Banker Meetings: Never    Marital Status: Divorced  Catering Manager Violence: Not At Risk (03/12/2024)   Humiliation, Afraid, Rape, and Kick questionnaire    Fear of Current or Ex-Partner: No    Emotionally Abused: No    Physically Abused: No    Sexually Abused: No    Family History  Problem Relation Age of Onset   Diabetes Mellitus II Mother    CAD Mother    Allergies  Allergen Reactions   Cefepime  Itching   I? Current Outpatient Medications  Medication Sig Dispense Refill   acetaminophen  (TYLENOL ) 325 MG tablet Take 2 tablets (650 mg total) by mouth every 4 (four) hours as needed for headache or mild pain (pain score 1-3). 180 tablet  0   aspirin  EC 81 MG tablet Take 1 tablet (81 mg total) by mouth daily. Swallow whole. 30 tablet 12   ceFAZolin  (ANCEF ) IVPB Inject 2 g into the vein every 8 (eight) hours. Indication:  MSSA foot osteomyelitis Last Day of Therapy:  04/24/24 Labs - Once weekly:  CBC/D and CMP, Labs - Once weekly: ESR and CRP Fax  weekly lab results  promptly to [phone] & [phone] Method of administration: IV Push Method of administration may be changed at the discretion of the facility and/or its pharmacy. Please pull PIC at completion of IV antibiotics  Call [phone] with any critical value or questions 108 Units 0   Continuous Glucose Sensor (FREESTYLE LIBRE 2 SENSOR) MISC      cyanocobalamin  (VITAMIN B12) 1000 MCG tablet Take 1 tablet (1,000 mcg total) by mouth daily.     Dulaglutide  (TRULICITY ) 0.75 MG/0.5ML SOAJ Inject 0.75 mg into the skin once a week. Every Monday     ELIQUIS  5 MG TABS tablet TAKE 1 TABLET(5 MG) BY MOUTH TWICE DAILY 60 tablet 2   gabapentin  (NEURONTIN ) 300 MG capsule Take 1 capsule (300 mg total) by mouth 3 (three) times daily. 90 capsule 0   glucose blood (ONETOUCH ULTRA) test strip Use 3 (three) times daily     insulin  glargine (LANTUS ) 100 UNIT/ML Solostar Pen Inject 28 Units into the skin at bedtime. (Patient taking differently: Inject 28 Units into the skin daily.)     Insulin  Pen Needle (FIFTY50 PEN NEEDLES) 32G X 4 MM MISC USE 4 TIMES DAILY     metFORMIN  (GLUCOPHAGE -XR) 500 MG 24 hr tablet Take 1,000 mg by mouth 2 (two) times daily with a meal.     metoprolol  tartrate (LOPRESSOR ) 25 MG tablet Take 0.5 tablets (12.5 mg total) by mouth 2 (two) times daily.     oxyCODONE  (OXY IR/ROXICODONE ) 5 MG immediate release tablet Take 1-2 tablets (5-10 mg total) by mouth every 4 (four) hours as needed for moderate pain (pain score 4-6) or severe pain (pain score 7-10). 30 tablet 0   OZEMPIC, 1 MG/DOSE, 4 MG/3ML SOPN Inject 1 mg into the skin once a week.     pravastatin   (PRAVACHOL ) 80 MG tablet Take 80 mg by mouth daily.     traZODone  (DESYREL ) 50 MG tablet Take 50 mg by mouth at bedtime.     No current facility-administered medications for this visit.     Abtx:  Anti-infectives (From admission, onward)    None       REVIEW OF SYSTEMS:  Const: negative fever, negative chills, negative weight loss Eyes: negative diplopia or visual changes, negative eye pain ENT: negative coryza, negative sore throat Resp: negative cough, hemoptysis, dyspnea Cards: negative for chest pain, palpitations, lower extremity edema GU: negative for frequency, dysuria and hematuria GI: Negative for abdominal pain, diarrhea, bleeding, constipation Skin: negative for rash and pruritus Heme: negative for easy bruising and gum/nose bleeding MS: as above Neurolo:negative for headaches, dizziness, vertigo, memory problems  Psych: in good spirits  Endocrine: , diabetes Allergy/Immunology- negative for any medication or food allergies ?  Objective:  VITALS:  BP 102/60   Pulse 76   Temp 98.2 F (36.8 C) (Temporal)  LDA Rt PICC PHYSICAL EXAM:  General: looks well Lungs: Clear to auscultation bilaterally. No Wheezing or Rhonchi. No rales. Heart: Regular rate and rhythm, no murmur, rub or gallop. Abdomen: did not examine Extremities: rt foot TMA site-  Surgical wound area is covered with vanc- I removed it              10//2/25   01/08/24     12/13/23   11/29/23    11/20/23          Left leg BKA Prosthetic leg Skin: No rashes or lesions. Or bruising Lymph: Cervical, supraclavicular normal. Neurologic: Grossly non-focal Pertinent Labs Lab Results from 03/31/24  CBC HB 11.2 CRP<1 ESR 25 Cr 1.19  ? Impression/Recommendation ?Diabetic foot infection with peripheral artery disease ,involving the right foot at the site of prior TMA Recent 5th met resection Ongoing wound-now being treated for MSSA in culture and osteo- 6 weeks of  cefazolin   will complete on 04/24/24- He would like to finish by 12/21 or 12/22- that is fine- He can go on cefodroxil following that-  Labs looking good Will send prescription for cefadroxil  1 gram BID to  Pt tolerating cefazolin  very well though has cefepime  intolerance  PAD rt leg Followed by vein and vascular Multiple stents   left BKA    CAD Status post CABG   A-fib on  Eliquis    ?  ________________________________________________ Discussed with patient, and his friend-  Follow 6  weeks

## 2024-04-14 ENCOUNTER — Other Ambulatory Visit: Payer: Self-pay

## 2024-04-14 ENCOUNTER — Emergency Department

## 2024-04-14 ENCOUNTER — Inpatient Hospital Stay
Admission: EM | Admit: 2024-04-14 | Discharge: 2024-04-20 | DRG: 638 | Disposition: A | Discharge status: Home Health | Source: Skilled Nursing Facility | Attending: Internal Medicine | Admitting: Internal Medicine

## 2024-04-14 DIAGNOSIS — Z951 Presence of aortocoronary bypass graft: Secondary | ICD-10-CM

## 2024-04-14 DIAGNOSIS — I129 Hypertensive chronic kidney disease with stage 1 through stage 4 chronic kidney disease, or unspecified chronic kidney disease: Secondary | ICD-10-CM | POA: Diagnosis present

## 2024-04-14 DIAGNOSIS — Z7982 Long term (current) use of aspirin: Secondary | ICD-10-CM

## 2024-04-14 DIAGNOSIS — E11628 Type 2 diabetes mellitus with other skin complications: Principal | ICD-10-CM | POA: Diagnosis present

## 2024-04-14 DIAGNOSIS — Z89431 Acquired absence of right foot: Secondary | ICD-10-CM | POA: Diagnosis not present

## 2024-04-14 DIAGNOSIS — L03211 Cellulitis of face: Principal | ICD-10-CM

## 2024-04-14 DIAGNOSIS — E1169 Type 2 diabetes mellitus with other specified complication: Secondary | ICD-10-CM | POA: Diagnosis present

## 2024-04-14 DIAGNOSIS — E119 Type 2 diabetes mellitus without complications: Secondary | ICD-10-CM

## 2024-04-14 DIAGNOSIS — I2510 Atherosclerotic heart disease of native coronary artery without angina pectoris: Secondary | ICD-10-CM | POA: Diagnosis present

## 2024-04-14 DIAGNOSIS — B9561 Methicillin susceptible Staphylococcus aureus infection as the cause of diseases classified elsewhere: Secondary | ICD-10-CM

## 2024-04-14 DIAGNOSIS — N1831 Chronic kidney disease, stage 3a: Secondary | ICD-10-CM | POA: Diagnosis present

## 2024-04-14 DIAGNOSIS — M86671 Other chronic osteomyelitis, right ankle and foot: Secondary | ICD-10-CM | POA: Diagnosis present

## 2024-04-14 DIAGNOSIS — E1151 Type 2 diabetes mellitus with diabetic peripheral angiopathy without gangrene: Secondary | ICD-10-CM | POA: Diagnosis present

## 2024-04-14 DIAGNOSIS — Z87891 Personal history of nicotine dependence: Secondary | ICD-10-CM

## 2024-04-14 DIAGNOSIS — B029 Zoster without complications: Secondary | ICD-10-CM | POA: Diagnosis not present

## 2024-04-14 DIAGNOSIS — B028 Zoster with other complications: Secondary | ICD-10-CM

## 2024-04-14 DIAGNOSIS — Z8249 Family history of ischemic heart disease and other diseases of the circulatory system: Secondary | ICD-10-CM | POA: Diagnosis not present

## 2024-04-14 DIAGNOSIS — E7800 Pure hypercholesterolemia, unspecified: Secondary | ICD-10-CM | POA: Diagnosis present

## 2024-04-14 DIAGNOSIS — D631 Anemia in chronic kidney disease: Secondary | ICD-10-CM | POA: Diagnosis present

## 2024-04-14 DIAGNOSIS — E1142 Type 2 diabetes mellitus with diabetic polyneuropathy: Secondary | ICD-10-CM | POA: Diagnosis present

## 2024-04-14 DIAGNOSIS — W19XXXA Unspecified fall, initial encounter: Secondary | ICD-10-CM | POA: Diagnosis present

## 2024-04-14 DIAGNOSIS — E1122 Type 2 diabetes mellitus with diabetic chronic kidney disease: Secondary | ICD-10-CM | POA: Diagnosis present

## 2024-04-14 DIAGNOSIS — B0230 Zoster ocular disease, unspecified: Secondary | ICD-10-CM | POA: Diagnosis present

## 2024-04-14 DIAGNOSIS — Z89512 Acquired absence of left leg below knee: Secondary | ICD-10-CM | POA: Diagnosis not present

## 2024-04-14 DIAGNOSIS — E11621 Type 2 diabetes mellitus with foot ulcer: Secondary | ICD-10-CM | POA: Diagnosis present

## 2024-04-14 DIAGNOSIS — Z7984 Long term (current) use of oral hypoglycemic drugs: Secondary | ICD-10-CM | POA: Diagnosis not present

## 2024-04-14 DIAGNOSIS — Z7901 Long term (current) use of anticoagulants: Secondary | ICD-10-CM

## 2024-04-14 DIAGNOSIS — L089 Local infection of the skin and subcutaneous tissue, unspecified: Secondary | ICD-10-CM | POA: Diagnosis not present

## 2024-04-14 DIAGNOSIS — Z833 Family history of diabetes mellitus: Secondary | ICD-10-CM

## 2024-04-14 DIAGNOSIS — Z79899 Other long term (current) drug therapy: Secondary | ICD-10-CM

## 2024-04-14 DIAGNOSIS — I480 Paroxysmal atrial fibrillation: Secondary | ICD-10-CM | POA: Diagnosis present

## 2024-04-14 DIAGNOSIS — Z7985 Long-term (current) use of injectable non-insulin antidiabetic drugs: Secondary | ICD-10-CM | POA: Diagnosis not present

## 2024-04-14 DIAGNOSIS — Z881 Allergy status to other antibiotic agents status: Secondary | ICD-10-CM

## 2024-04-14 DIAGNOSIS — I482 Chronic atrial fibrillation, unspecified: Secondary | ICD-10-CM | POA: Diagnosis present

## 2024-04-14 DIAGNOSIS — Z794 Long term (current) use of insulin: Secondary | ICD-10-CM

## 2024-04-14 LAB — COMPREHENSIVE METABOLIC PANEL WITH GFR
ALT: <5 U/L (ref 0–44)
AST: 27 U/L (ref 15–41)
Albumin: 4.3 g/dL (ref 3.5–5.0)
Alkaline Phosphatase: 83 U/L (ref 38–126)
Anion gap: 11 (ref 5–15)
BUN: 12 mg/dL (ref 8–23)
CO2: 25 mmol/L (ref 22–32)
Calcium: 9.8 mg/dL (ref 8.9–10.3)
Chloride: 104 mmol/L (ref 98–111)
Creatinine, Ser: 1.27 mg/dL — ABNORMAL HIGH (ref 0.61–1.24)
GFR, Estimated: 59 mL/min — ABNORMAL LOW (ref >60)
Glucose, Bld: 117 mg/dL — ABNORMAL HIGH (ref 70–99)
Potassium: 4.4 mmol/L (ref 3.5–5.1)
Sodium: 140 mmol/L (ref 135–145)
Total Bilirubin: 0.4 mg/dL (ref 0.0–1.2)
Total Protein: 7.4 g/dL (ref 6.5–8.1)

## 2024-04-14 LAB — CBC WITH DIFFERENTIAL/PLATELET
Abs Immature Granulocytes: 0.02 K/uL (ref 0.00–0.07)
Basophils Absolute: 0 K/uL (ref 0.0–0.1)
Basophils Relative: 0 %
Eosinophils Absolute: 0.2 K/uL (ref 0.0–0.5)
Eosinophils Relative: 4 %
HCT: 38 % — ABNORMAL LOW (ref 39.0–52.0)
Hemoglobin: 12.4 g/dL — ABNORMAL LOW (ref 13.0–17.0)
Immature Granulocytes: 0 %
Lymphocytes Relative: 15 %
Lymphs Abs: 0.7 K/uL (ref 0.7–4.0)
MCH: 28.4 pg (ref 26.0–34.0)
MCHC: 32.6 g/dL (ref 30.0–36.0)
MCV: 87 fL (ref 80.0–100.0)
Monocytes Absolute: 0.4 K/uL (ref 0.1–1.0)
Monocytes Relative: 9 %
Neutro Abs: 3.4 K/uL (ref 1.7–7.7)
Neutrophils Relative %: 72 %
Platelets: 167 K/uL (ref 150–400)
RBC: 4.37 MIL/uL (ref 4.22–5.81)
RDW: 14.9 % (ref 11.5–15.5)
WBC: 4.8 K/uL (ref 4.0–10.5)
nRBC: 0 % (ref 0.0–0.2)

## 2024-04-14 LAB — GLUCOSE, CAPILLARY
Glucose-Capillary: 100 mg/dL — ABNORMAL HIGH (ref 70–99)
Glucose-Capillary: 169 mg/dL — ABNORMAL HIGH (ref 70–99)

## 2024-04-14 LAB — LACTIC ACID, PLASMA
Lactic Acid, Venous: 1 mmol/L (ref 0.5–1.9)
Lactic Acid, Venous: 1.4 mmol/L (ref 0.5–1.9)

## 2024-04-14 MED ORDER — INSULIN ASPART 100 UNIT/ML IJ SOLN
0.0000 [IU] | Freq: Three times a day (TID) | INTRAMUSCULAR | Status: DC
  Administered 2024-04-15 (×2): 1 [IU] via SUBCUTANEOUS
  Administered 2024-04-15 – 2024-04-16 (×3): 2 [IU] via SUBCUTANEOUS
  Administered 2024-04-17 – 2024-04-19 (×3): 1 [IU] via SUBCUTANEOUS
  Filled 2024-04-14 (×2): qty 2
  Filled 2024-04-14 (×4): qty 1
  Filled 2024-04-14: qty 2
  Filled 2024-04-14: qty 1

## 2024-04-14 MED ORDER — METOPROLOL TARTRATE 25 MG PO TABS
12.5000 mg | ORAL_TABLET | Freq: Two times a day (BID) | ORAL | Status: DC
  Administered 2024-04-14 – 2024-04-20 (×12): 12.5 mg via ORAL
  Filled 2024-04-14 (×12): qty 1

## 2024-04-14 MED ORDER — ERYTHROMYCIN 5 MG/GM OP OINT
TOPICAL_OINTMENT | Freq: Three times a day (TID) | OPHTHALMIC | Status: DC
  Administered 2024-04-14 – 2024-04-20 (×16): 1 via OPHTHALMIC
  Filled 2024-04-14 (×4): qty 1

## 2024-04-14 MED ORDER — TRAZODONE HCL 50 MG PO TABS
50.0000 mg | ORAL_TABLET | Freq: Every day | ORAL | Status: DC
  Administered 2024-04-14 – 2024-04-19 (×6): 50 mg via ORAL
  Filled 2024-04-14 (×6): qty 1

## 2024-04-14 MED ORDER — ASPIRIN 81 MG PO TBEC
81.0000 mg | DELAYED_RELEASE_TABLET | Freq: Every day | ORAL | Status: DC
  Administered 2024-04-15 – 2024-04-20 (×6): 81 mg via ORAL
  Filled 2024-04-14 (×6): qty 1

## 2024-04-14 MED ORDER — ONDANSETRON HCL 4 MG PO TABS: 4.0000 mg | ORAL_TABLET | Freq: Four times a day (QID) | ORAL | Status: DC | PRN

## 2024-04-14 MED ORDER — OXYCODONE HCL 5 MG PO TABS
5.0000 mg | ORAL_TABLET | ORAL | Status: DC | PRN
  Administered 2024-04-14: 22:00:00 5 mg via ORAL
  Administered 2024-04-15 – 2024-04-17 (×6): 10 mg via ORAL
  Administered 2024-04-17 – 2024-04-18 (×3): 5 mg via ORAL
  Filled 2024-04-14: qty 1
  Filled 2024-04-14: qty 2
  Filled 2024-04-14 (×2): qty 1
  Filled 2024-04-14: qty 2
  Filled 2024-04-14: qty 1
  Filled 2024-04-14: qty 2
  Filled 2024-04-14 (×2): qty 1
  Filled 2024-04-14 (×2): qty 2
  Filled 2024-04-14: qty 1

## 2024-04-14 MED ORDER — ACETAMINOPHEN 325 MG PO TABS
650.0000 mg | ORAL_TABLET | ORAL | Status: DC | PRN
  Administered 2024-04-14 – 2024-04-15 (×2): 650 mg via ORAL
  Filled 2024-04-14 (×2): qty 2

## 2024-04-14 MED ORDER — GABAPENTIN 300 MG PO CAPS
300.0000 mg | ORAL_CAPSULE | Freq: Three times a day (TID) | ORAL | Status: DC
  Administered 2024-04-14 – 2024-04-20 (×18): 300 mg via ORAL
  Filled 2024-04-14 (×18): qty 1

## 2024-04-14 MED ORDER — SODIUM CHLORIDE 0.9% FLUSH
10.0000 mL | Freq: Two times a day (BID) | INTRAVENOUS | Status: DC
  Administered 2024-04-15: 10:00:00 20 mL
  Administered 2024-04-16 – 2024-04-17 (×3): 10 mL
  Administered 2024-04-17: 21:00:00 30 mL
  Administered 2024-04-18 – 2024-04-20 (×5): 10 mL

## 2024-04-14 MED ORDER — CEFAZOLIN SODIUM-DEXTROSE 2-4 GM/100ML-% IV SOLN
2.0000 g | Freq: Three times a day (TID) | INTRAVENOUS | Status: DC
  Administered 2024-04-14 – 2024-04-20 (×19): 2 g via INTRAVENOUS
  Filled 2024-04-14 (×19): qty 100

## 2024-04-14 MED ORDER — DEXTROSE 5 % IV SOLN
10.0000 mg/kg | Freq: Three times a day (TID) | INTRAVENOUS | Status: AC
  Administered 2024-04-14 – 2024-04-17 (×8): 860 mg via INTRAVENOUS
  Filled 2024-04-14 (×8): qty 17.2

## 2024-04-14 MED ORDER — APIXABAN 5 MG PO TABS
5.0000 mg | ORAL_TABLET | Freq: Two times a day (BID) | ORAL | Status: DC
  Administered 2024-04-14 – 2024-04-20 (×12): 5 mg via ORAL
  Filled 2024-04-14 (×12): qty 1

## 2024-04-14 MED ORDER — ONDANSETRON HCL 4 MG/2ML IJ SOLN: 4.0000 mg | Freq: Four times a day (QID) | INTRAMUSCULAR | Status: DC | PRN

## 2024-04-14 MED ORDER — VANCOMYCIN HCL IN DEXTROSE 1-5 GM/200ML-% IV SOLN
1000.0000 mg | Freq: Once | INTRAVENOUS | Status: AC
  Administered 2024-04-14: 10:00:00 1000 mg via INTRAVENOUS
  Filled 2024-04-14: qty 200

## 2024-04-14 MED ORDER — VALACYCLOVIR HCL 500 MG PO TABS: 1000.0000 mg | ORAL_TABLET | Freq: Three times a day (TID) | ORAL | Status: DC

## 2024-04-14 MED ORDER — IOHEXOL 300 MG/ML  SOLN
75.0000 mL | Freq: Once | INTRAMUSCULAR | Status: AC | PRN
  Administered 2024-04-14: 11:00:00 75 mL via INTRAVENOUS

## 2024-04-14 MED ORDER — SODIUM CHLORIDE 0.9 % IV BOLUS
1000.0000 mL | Freq: Once | INTRAVENOUS | Status: AC
  Administered 2024-04-14: 10:00:00 1000 mL via INTRAVENOUS

## 2024-04-14 MED ORDER — CHLORHEXIDINE GLUCONATE CLOTH 2 % EX PADS
6.0000 | MEDICATED_PAD | Freq: Every day | CUTANEOUS | Status: DC
  Administered 2024-04-14 – 2024-04-19 (×4): 6 via TOPICAL

## 2024-04-14 MED ORDER — SODIUM CHLORIDE 0.9% FLUSH: 10.0000 mL | INTRAVENOUS | Status: DC | PRN

## 2024-04-14 MED ORDER — SODIUM CHLORIDE 0.9 % IV SOLN: INTRAVENOUS | Status: DC

## 2024-04-14 MED ORDER — PRAVASTATIN SODIUM 20 MG PO TABS
80.0000 mg | ORAL_TABLET | Freq: Every day | ORAL | Status: DC
  Administered 2024-04-14 – 2024-04-19 (×6): 80 mg via ORAL
  Filled 2024-04-14 (×6): qty 4

## 2024-04-14 MED ORDER — SENNOSIDES-DOCUSATE SODIUM 8.6-50 MG PO TABS
1.0000 | ORAL_TABLET | Freq: Every evening | ORAL | Status: DC | PRN
  Administered 2024-04-18: 1 via ORAL
  Filled 2024-04-14: qty 1

## 2024-04-14 NOTE — TOC Progression Note (Signed)
 Transition of Care [facility]) - Progression Note    Patient Details  Name: Andrew Harding MRN: [id number] Date of Birth: 21-Mar-1951  [facility] CM/SW Contact  Nathanael CHRISTELLA Ring, RN Phone Number: 04/14/2024, 4:40 PM  Clinical Narrative:     Beatris with Pam with [facility] about IV antibiotics she will be following patient progress and assist as needed with discharge.   Expected Discharge Plan: Home w Home Health Services Barriers to Discharge: Continued Medical Work up               Expected Discharge Plan and Services   Discharge Planning Services: CM Consult Post Acute Care Choice: Home Health Living arrangements for the past 2 months: Single Family Home                           HH Arranged: RN, PT, OT, Nurse's Aide, IV Antibiotics           Social Drivers of Health (SDOH) Interventions SDOH Screenings   Food Insecurity: No Food Insecurity (03/12/2024)  Housing: Low Risk (03/12/2024)  Transportation Needs: No Transportation Needs (03/12/2024)  Utilities: Not At Risk (03/12/2024)  Depression (PHQ2-9): Low Risk (05/03/2023)  Financial Resource Strain: Low Risk  (02/22/2024)   Received from [facility]  Social Connections: Unknown (03/12/2024)  Recent Concern: Social Connections - Moderately Isolated (01/10/2024)  Tobacco Use: Medium Risk (04/14/2024)    Readmission Risk Interventions    04/14/2024    2:51 PM 03/14/2024    2:43 PM 01/14/2024   10:52 AM  Readmission Risk Prevention Plan  Transportation Screening Complete Complete Complete  PCP or Specialist Appt within 3-5 Days Complete Complete Complete  HRI or Home Care Consult Complete  Complete  Social Work Consult for Recovery Care Planning/Counseling Complete Complete Complete  Palliative Care Screening Not Applicable Not Applicable Not Applicable  Medication Review Oceanographer) Complete Complete Complete

## 2024-04-14 NOTE — ED Provider Notes (Addendum)
 [facility] Provider Note    Event Date/Time   First MD Initiated Contact with Patient 04/14/24 [phone]     (approximate)   History   Chief Complaint: Fall   HPI  Andrew SONNENBERG is a 73 y.o. 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  EMS report that patient had a fall at [employer] on December 7.  Was not evaluated at that time.  Had some minor skin wounds on the left forehead.  Over the past few days he has had worsening redness and swelling of the area as well as some scant drainage.  Reports increasing pain as well.  No vision changes or headaches.  No neck pain.         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) 09/03/2023   Atrial fibrillation with RVR (HCC) 08/19/2022   Bladder neck obstruction    Cellulitis 08/17/2022   Chronic kidney disease    Coronary artery disease    a.) s/p 4v CABG in 2014   Diabetes mellitus without complication (HCC)    Diabetic neuropathy (HCC)    Diabetic peripheral neuropathy (HCC)    Diabetic ulcer of toe of left foot associated with diabetes mellitus of other type, limited to breakdown of skin (HCC) 12/21/2017   Diverticulosis    Gout    Gram-negative bacteremia 05/11/2023   Heart murmur    History of osteomyelitis 05/31/2015   Hypercholesteremia    Hyperlipidemia    Hypertension    Hypotension due to hypovolemia 08/17/2022   Infection of left foot 08/19/2022   MSSA bacteremia 02/28/2023   Open wound of left foot with complication 05/10/2023   Osteomyelitis (HCC) 05/31/2015   Peripheral neuropathy    Postural dizziness with presyncope 02/26/2023   S/P BKA (below knee amputation) unilateral, left  (HCC) 06/09/2023   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   S/P transmetatarsal amputation of foot, left (HCC) 08/29/2022   Sepsis (HCC) 08/17/2022   Sepsis (HCC) 05/10/2023   Status post amputation of toe of right foot 11/01/2015   Tubular adenoma    Vitamin D  deficiency     Current Outpatient Rx   Order #: [id number] Class: Normal   Order #: [id number] Class: Normal   [START ON 04/20/2024] Order #: [id number] Class: Normal   Order #: [id number] Class: Print   Order #: [id number] Class: Historical Med   Order #: [id number] Class: No Print   Order #: [id number] Class: Historical Med   Order #: [id number] Class: Normal   Order #: [id number] Class: Normal   Order #: [id number] Class: Historical Med   Order #: [id number] Class: No Print   Order #: [id number] Class: Historical Med   Order #: [id number] Class: Historical Med   Order #: [id number] Class: No Print   Order #: [id number] Class: Print   Order #: [id number] Class: Historical Med   Order #: [id number] Class: Historical Med   Order #: [id number] Class: Historical Med    Past Surgical History:  Procedure Laterality Date   ABDOMINAL AORTOGRAM W/LOWER EXTREMITY N/A 11/05/2023   Procedure: ABDOMINAL AORTOGRAM W/LOWER EXTREMITY;  Surgeon: Marea Selinda RAMAN, MD;  Location: [facility];  Service: Cardiovascular;  Laterality: N/A;   ACHILLES TENDON SURGERY Right 09/14/2023   Procedure: TENOTOMY, ACHILLES;  Surgeon: [name],  DPM;  Location: [facility];  Service: Orthopedics/Podiatry;  Laterality: Right;   AMPUTATION Left 08/19/2022   Procedure: TRANSMETATARSAL AMPUTATION LEFT FOOT WITH IRRIGATION AND DEBRIDEMENT;  Surgeon: Lennie Barter, DPM;  Location: [facility];  Service: Podiatry;  Laterality: Left;   AMPUTATION Left 05/16/2023   Procedure: AMPUTATION BELOW KNEE;  Surgeon: Marea Selinda RAMAN, MD;  Location: [facility];  Service: General;  Laterality: Left;   AMPUTATION Right 09/14/2023   Procedure: AMPUTATION, FOOT, RAY;  Surgeon: [name], DPM;  Location: [facility];  Service:  Orthopedics/Podiatry;  Laterality: Right;   AMPUTATION TOE Right 06/01/2015   Procedure: AMPUTATION TOE;  Surgeon: Donnice Cory, DPM;  Location: [facility];  Service: Podiatry;  Laterality: Right;   AMPUTATION TOE Left 08/11/2022   Procedure: AMPUTATION TOE 2, 3, 4;  Surgeon: [name], DPM;  Location: [facility];  Service: Podiatry;  Laterality: Left;   APPLICATION OF WOUND VAC Right 03/13/2024   Procedure: APPLICATION, WOUND VAC;  Surgeon: [name], DPM;  Location: [facility];  Service: Orthopedics/Podiatry;  Laterality: Right;   CATARACT EXTRACTION, BILATERAL     CIRCUMCISION N/A 06/12/2022   Procedure: CIRCUMCISION ADULT;  Surgeon: Penne Knee, MD;  Location: [facility];  Service: Urology;  Laterality: N/A;   COLONOSCOPY WITH PROPOFOL  N/A 02/14/2016   Procedure: COLONOSCOPY WITH PROPOFOL ;  Surgeon: Gladis RAYMOND Mariner, MD;  Location: [facility];  Service: Endoscopy;  Laterality: N/A;   COLONOSCOPY WITH PROPOFOL  N/A 01/07/2019   Procedure: COLONOSCOPY WITH PROPOFOL ;  Surgeon: Mariner Gladis RAYMOND, MD;  Location: [facility];  Service: Endoscopy;  Laterality: N/A;   CORONARY ARTERY BYPASS GRAFT N/A 08/2012   EXCISION PARTIAL PHALANX Right 06/01/2015   Procedure: EXCISION PARTIAL PHALANX /  BONE;  Surgeon: Donnice Cory, DPM;  Location: [facility];  Service: Podiatry;  Laterality: Right;   FLEXIBLE SIGMOIDOSCOPY N/A 05/29/2016   Procedure: FLEXIBLE SIGMOIDOSCOPY;  Surgeon: Gladis RAYMOND Mariner, MD;  Location: [facility];  Service: Endoscopy;  Laterality: N/A;   INCISION AND DRAINAGE Left 08/23/2022   Procedure: INCISION AND DRAINAGE;  Surgeon: [name], DPM;  Location: [facility];  Service: Podiatry;  Laterality: Left;   INCISION AND DRAINAGE OF WOUND Left 09/15/2022   Procedure: 11044 - DEBRIDE BONE and EXCISION IF 1ST METATARSAL BONE WITH  DELAY PRIMARY CLOSURE;  Surgeon: [name], DPM;  Location: [facility];  Service: Podiatry;  Laterality: Left;   IRRIGATION AND  DEBRIDEMENT FOOT Left 02/28/2023   Procedure: IRRIGATION AND DEBRIDEMENT FOOT;  Surgeon: [name], DPM;  Location: [facility];  Service: Orthopedics/Podiatry;  Laterality: Left;   IRRIGATION AND DEBRIDEMENT FOOT Right 01/11/2024   Procedure: IRRIGATION AND DEBRIDEMENT FOOT;  Surgeon: [name], DPM;  Location: [facility];  Service: Orthopedics/Podiatry;  Laterality: Right;   IRRIGATION AND DEBRIDEMENT FOOT Right 03/13/2024   Procedure: IRRIGATION AND DEBRIDEMENT FOOT;  Surgeon: [name], DPM;  Location: [facility];  Service: Orthopedics/Podiatry;  Laterality: Right;   KNEE ARTHROSCOPY Left    LOWER EXTREMITY ANGIOGRAPHY Left 08/22/2022   Procedure: Lower Extremity Angiography;  Surgeon: Jama Cordella MATSU, MD;  Location: [facility];  Service: Cardiovascular;  Laterality: Left;   LOWER EXTREMITY ANGIOGRAPHY Left 05/11/2023   Procedure: Lower Extremity Angiography;  Surgeon: Marea Selinda RAMAN, MD;  Location: [facility];  Service: Cardiovascular;  Laterality: Left;   LOWER EXTREMITY ANGIOGRAPHY Right 09/11/2023   Procedure: Lower Extremity Angiography;  Surgeon: Jama Cordella MATSU, MD;  Location: [facility];  Service: Cardiovascular;  Laterality: Right;   LOWER EXTREMITY  ANGIOGRAPHY Right 01/15/2024   Procedure: Lower Extremity Angiography;  Surgeon: Jama Cordella MATSU, MD;  Location: [facility];  Service: Cardiovascular;  Laterality: Right;   LOWER EXTREMITY ANGIOGRAPHY Left 03/11/2024   Procedure: Lower Extremity Angiography;  Surgeon: Jama Cordella MATSU, MD;  Location: [facility];  Service: Cardiovascular;  Laterality: Left;   LOWER EXTREMITY INTERVENTION Right 09/11/2023   Procedure: LOWER EXTREMITY INTERVENTION;  Surgeon: Jama Cordella MATSU, MD;  Location: [facility];  Service: Cardiovascular;  Laterality: Right;   TEE WITHOUT CARDIOVERSION N/A 03/01/2023   Procedure: TRANSESOPHAGEAL ECHOCARDIOGRAM;  Surgeon: Alluri, Keller BROCKS, MD;   Location: [facility];  Service: Cardiovascular;  Laterality: N/A;   TRANSMETATARSAL AMPUTATION Right 11/02/2023   Procedure: AMPUTATION, FOOT, TRANSMETATARSAL;  Surgeon: [name], DPM;  Location: [facility];  Service: Orthopedics/Podiatry;  Laterality: Right;   TRANSMETATARSAL AMPUTATION Right 12/21/2023   Procedure: REVISION, AMPUTATION, FOOT, TRANSMETATARSAL;  Surgeon: [name], DPM;  Location: [facility];  Service: Orthopedics/Podiatry;  Laterality: Right;   WOUND DEBRIDEMENT Left 09/15/2022   Procedure: 11043 - DEBRIDE SKIN. MUSCLE FASCIA;  Surgeon: [name], DPM;  Location: [facility];  Service: Podiatry;  Laterality: Left;    Physical Exam   Triage Vital Signs: ED Triage Vitals  Encounter Vitals Group     BP --      Girls Systolic BP Percentile --      Girls Diastolic BP Percentile --      Boys Systolic BP Percentile --      Boys Diastolic BP Percentile --      Pulse Rate 04/14/24 1001 86     Resp 04/14/24 1001 16     Temp 04/14/24 1001 98.3 F (36.8 C)     Temp Source 04/14/24 1001 Oral     SpO2 04/14/24 1001 100 %     Weight 04/14/24 0959 190 lb (86.2 kg)     Height 04/14/24 0959 6' 3 (1.905 m)     Head Circumference --      Peak Flow --      Pain Score 04/14/24 0959 5     Pain Loc --      Pain Education --      Exclude from Growth Chart --     Most recent vital signs: Vitals:   04/14/24 1005 04/14/24 1030  BP: (!) 164/87 134/86  Pulse: 78 90  Resp: 15 16  Temp:    SpO2: 100% 100%    General: Awake, no distress.  CV:  Good peripheral perfusion.  Regular rate rhythm Resp:  Normal effort.  Clear lungs Abd:  No distention.  Soft nontender Other:  There is confluent erythema, edema, warmth, tenderness of the left frontal and parietal scalp extending down to the left maxilla and mandible.  There is some crusting of wounds as well with a serous discharge.  Left eye is difficult to evaluate due to eyelid edema, but appears grossly normal   ED Results /  Procedures / Treatments   Labs (all labs ordered are listed, but only abnormal results are displayed) Labs Reviewed  COMPREHENSIVE METABOLIC PANEL WITH GFR - Abnormal; Notable for the following components:      Result Value   Glucose, Bld 117 (*)    Creatinine, Ser 1.27 (*)    GFR, Estimated 59 (*)    All other components within normal limits  CBC WITH DIFFERENTIAL/PLATELET - Abnormal; Notable for the following components:   Hemoglobin 12.4 (*)    HCT 38.0 (*)  All other components within normal limits  CULTURE, BLOOD (ROUTINE X 2)  CULTURE, BLOOD (ROUTINE X 2)  LACTIC ACID, PLASMA  LACTIC ACID, PLASMA     EKG Interpreted by me Atrial fibrillation, rate of 75.  Normal axis and intervals.  Normal QRS ST segments and T waves   RADIOLOGY CT head interpreted by me, no intracranial hemorrhage.  Radiology report reviewed CT orbits negative for orbital cellulitis.  Shows preseptal/facial cellulitis.   PROCEDURES:  Procedures   MEDICATIONS ORDERED IN ED: Medications  vancomycin  (VANCOCIN ) IVPB 1000 mg/200 mL premix (1,000 mg Intravenous New Bag/Given 04/14/24 1014)  sodium chloride  0.9 % bolus 1,000 mL (1,000 mLs Intravenous New Bag/Given 04/14/24 1012)  iohexol  (OMNIPAQUE ) 300 MG/ML solution 75 mL (75 mLs Intravenous Contrast Given 04/14/24 1058)     IMPRESSION / MDM / ASSESSMENT AND PLAN / ED COURSE  I reviewed the triage vital signs and the nursing notes.  DDx: Intracranial hemorrhage, facial cellulitis, orbital cellulitis, AKI  Patient's presentation is most consistent with acute presentation with potential threat to life or bodily function.  Patient presents with inflammatory changes of the entire left side of the face with pronounced periorbital edema.  Will obtain CT head and orbits, start IV vancomycin , plan to admit.   Clinical Course as of 04/14/24 1147  Mon Apr 14, 2024  1147 Case discussed with hospitalist [PS]    Clinical Course User Index [PS]  Viviann Pastor, MD     FINAL CLINICAL IMPRESSION(S) / ED DIAGNOSES   Final diagnoses:  Facial cellulitis  Type 2 diabetes mellitus without complication, with long-term current use of insulin  (HCC)     Rx / DC Orders   ED Discharge Orders     None        Note:  This document was prepared using Dragon voice recognition software and may include unintentional dictation errors.   Viviann Pastor, MD 04/14/24 1132    Viviann Pastor, MD 04/14/24 [phone]

## 2024-04-14 NOTE — Consult Note (Signed)
 Reason for Consult: eye infection Referring Physician: Dr. Laurita, Hospitalist Chief complaint: swelling, redness left eye  HPI: Andrew Harding is an 73 y.o. male with h/o CAD and diabetes requiring lower extremity amputations with osteomyelitis and repeated infections, ophthalmology consult was requested for for eye infection in the left eye. The patient reported that he had a with some skin abrasions and there was concern that the infections were getting worse causing redness and irritation around his left eye.  He reports no eye pain or changes in vision in either eye.  He reports vision is ok if he holds his eyelid up with his fingers.     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) 09/03/2023   Atrial fibrillation with RVR (HCC) 08/19/2022   Bladder neck obstruction    Cellulitis 08/17/2022   Chronic kidney disease    Coronary artery disease    a.) s/p 4v CABG in 2014   Diabetes mellitus without complication (HCC)    Diabetic neuropathy (HCC)    Diabetic peripheral neuropathy (HCC)    Diabetic ulcer of toe of left foot associated with diabetes mellitus of other type, limited to breakdown of skin (HCC) 12/21/2017   Diverticulosis    Gout    Gram-negative bacteremia 05/11/2023   Heart murmur    History of osteomyelitis 05/31/2015   Hypercholesteremia    Hyperlipidemia    Hypertension    Hypotension due to hypovolemia 08/17/2022   Infection of left foot 08/19/2022   MSSA bacteremia 02/28/2023   Open wound of left foot with complication 05/10/2023   Osteomyelitis (HCC) 05/31/2015   Peripheral neuropathy    Postural dizziness with presyncope 02/26/2023   S/P BKA (below knee amputation) unilateral, 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   S/P transmetatarsal amputation of foot, left (HCC) 08/29/2022    Sepsis (HCC) 08/17/2022   Sepsis (HCC) 05/10/2023   Status post amputation of toe of right foot 11/01/2015   Tubular adenoma    Vitamin D  deficiency     ROS  Past Surgical History:  Procedure Laterality Date   ABDOMINAL AORTOGRAM W/LOWER EXTREMITY N/A 11/05/2023   Procedure: ABDOMINAL AORTOGRAM W/LOWER EXTREMITY;  Surgeon: Marea Selinda RAMAN, MD;  Location: [facility];  Service: Cardiovascular;  Laterality: N/A;   ACHILLES TENDON SURGERY Right 09/14/2023   Procedure: TENOTOMY, ACHILLES;  Surgeon: [name], DPM;  Location: [facility];  Service: Orthopedics/Podiatry;  Laterality: Right;   AMPUTATION Left 08/19/2022   Procedure: TRANSMETATARSAL AMPUTATION LEFT FOOT WITH IRRIGATION AND DEBRIDEMENT;  Surgeon: Lennie Barter, DPM;  Location: [facility];  Service: Podiatry;  Laterality: Left;   AMPUTATION Left 05/16/2023   Procedure: AMPUTATION BELOW KNEE;  Surgeon: Marea Selinda RAMAN, MD;  Location: [facility];  Service: General;  Laterality: Left;   AMPUTATION Right 09/14/2023   Procedure: AMPUTATION, FOOT, RAY;  Surgeon: [name], DPM;  Location: [facility];  Service: Orthopedics/Podiatry;  Laterality: Right;   AMPUTATION TOE Right 06/01/2015   Procedure: AMPUTATION TOE;  Surgeon: Donnice Cory, DPM;  Location: [facility];  Service: Podiatry;  Laterality: Right;   AMPUTATION TOE Left 08/11/2022   Procedure: AMPUTATION TOE 2, 3, 4;  Surgeon: [name], DPM;  Location: [facility];  Service: Podiatry;  Laterality: Left;   APPLICATION OF WOUND VAC Right 03/13/2024   Procedure: APPLICATION, WOUND VAC;  Surgeon: [name], DPM;  Location: [facility];  Service: Orthopedics/Podiatry;  Laterality: Right;   CATARACT EXTRACTION, BILATERAL     CIRCUMCISION N/A 06/12/2022   Procedure: CIRCUMCISION ADULT;  Surgeon: Penne Knee, MD;  Location: [facility];  Service: Urology;  Laterality: N/A;   COLONOSCOPY WITH PROPOFOL  N/A 02/14/2016   Procedure: COLONOSCOPY WITH PROPOFOL ;  Surgeon: Gladis RAYMOND Mariner,  MD;  Location: [facility];  Service: Endoscopy;  Laterality: N/A;   COLONOSCOPY WITH PROPOFOL  N/A 01/07/2019   Procedure: COLONOSCOPY WITH PROPOFOL ;  Surgeon: Mariner Gladis RAYMOND, MD;  Location: [facility];  Service: Endoscopy;  Laterality: N/A;   CORONARY ARTERY BYPASS GRAFT N/A 08/2012   EXCISION PARTIAL PHALANX Right 06/01/2015   Procedure: EXCISION PARTIAL PHALANX /  BONE;  Surgeon: Donnice Cory, DPM;  Location: [facility];  Service: Podiatry;  Laterality: Right;   FLEXIBLE SIGMOIDOSCOPY N/A 05/29/2016   Procedure: FLEXIBLE SIGMOIDOSCOPY;  Surgeon: Gladis RAYMOND Mariner, MD;  Location: [facility] ENDOSCOPY;  Service: Endoscopy;  Laterality: N/A;   INCISION AND DRAINAGE Left 08/23/2022   Procedure: INCISION AND DRAINAGE;  Surgeon: [name], DPM;  Location: [facility];  Service: Podiatry;  Laterality: Left;   INCISION AND DRAINAGE OF WOUND Left 09/15/2022   Procedure: 11044 - DEBRIDE BONE and EXCISION IF 1ST METATARSAL BONE WITH  DELAY PRIMARY CLOSURE;  Surgeon: [name], DPM;  Location: [facility];  Service: Podiatry;  Laterality: Left;   IRRIGATION AND DEBRIDEMENT FOOT Left 02/28/2023   Procedure: IRRIGATION AND DEBRIDEMENT FOOT;  Surgeon: [name], DPM;  Location: [facility];  Service: Orthopedics/Podiatry;  Laterality: Left;   IRRIGATION AND DEBRIDEMENT FOOT Right 01/11/2024   Procedure: IRRIGATION AND DEBRIDEMENT FOOT;  Surgeon: [name], DPM;  Location: [facility];  Service: Orthopedics/Podiatry;  Laterality: Right;   IRRIGATION AND DEBRIDEMENT FOOT Right 03/13/2024   Procedure: IRRIGATION AND DEBRIDEMENT FOOT;  Surgeon: [name], DPM;  Location: [facility];  Service: Orthopedics/Podiatry;  Laterality: Right;   KNEE ARTHROSCOPY Left    LOWER EXTREMITY ANGIOGRAPHY Left 08/22/2022   Procedure: Lower Extremity Angiography;  Surgeon: Jama Cordella MATSU, MD;  Location: [facility];  Service: Cardiovascular;  Laterality: Left;   LOWER EXTREMITY ANGIOGRAPHY Left 05/11/2023    Procedure: Lower Extremity Angiography;  Surgeon: Marea Selinda RAMAN, MD;  Location: [facility];  Service: Cardiovascular;  Laterality: Left;   LOWER EXTREMITY ANGIOGRAPHY Right 09/11/2023   Procedure: Lower Extremity Angiography;  Surgeon: Jama Cordella MATSU, MD;  Location: [facility];  Service: Cardiovascular;  Laterality: Right;   LOWER EXTREMITY ANGIOGRAPHY Right 01/15/2024   Procedure: Lower Extremity Angiography;  Surgeon: Jama Cordella MATSU, MD;  Location: [facility];  Service: Cardiovascular;  Laterality: Right;   LOWER EXTREMITY ANGIOGRAPHY Left 03/11/2024   Procedure: Lower Extremity Angiography;  Surgeon: Jama Cordella MATSU, MD;  Location: [facility];  Service: Cardiovascular;  Laterality: Left;   LOWER EXTREMITY INTERVENTION Right 09/11/2023   Procedure: LOWER EXTREMITY INTERVENTION;  Surgeon: Jama Cordella MATSU, MD;  Location: [facility];  Service: Cardiovascular;  Laterality: Right;   TEE WITHOUT CARDIOVERSION N/A 03/01/2023   Procedure: TRANSESOPHAGEAL ECHOCARDIOGRAM;  Surgeon: Alluri, Keller BROCKS, MD;  Location: [facility];  Service: Cardiovascular;  Laterality: N/A;   TRANSMETATARSAL AMPUTATION Right 11/02/2023   Procedure: AMPUTATION, FOOT, TRANSMETATARSAL;  Surgeon: [name], DPM;  Location: [facility];  Service: Orthopedics/Podiatry;  Laterality: Right;   TRANSMETATARSAL AMPUTATION Right 12/21/2023   Procedure: REVISION, AMPUTATION, FOOT, TRANSMETATARSAL;  Surgeon: [name], DPM;  Location: [facility];  Service: Orthopedics/Podiatry;  Laterality: Right;   WOUND DEBRIDEMENT Left 09/15/2022   Procedure: 11043 - DEBRIDE SKIN. MUSCLE FASCIA;  Surgeon: [name], DPM;  Location: [facility];  Service: Podiatry;  Laterality: Left;    Family History  Problem Relation Age of Onset   Diabetes Mellitus II Mother    CAD Mother     Social History:  reports that he has quit smoking. His smoking use included cigars. He has never used  smokeless tobacco. He reports that he does not drink alcohol and does not use drugs.  Allergies: Allergies[1]  Prior to Admission medications  Medication Sig Start Date End Date Taking? Authorizing Provider  acetaminophen  (TYLENOL ) 325 MG tablet Take 2 tablets (650 mg total) by mouth every 4 (four) hours as needed for headache or mild pain (pain score 1-3). 03/20/24  Yes Pace, Gwendlyn R, NP  aspirin  EC 81 MG tablet Take 1 tablet (81 mg total) by mouth daily. Swallow whole. 03/20/24  Yes Pace, Brien R, NP  ceFAZolin  (ANCEF ) IVPB Inject 2 g into the vein every 8 (eight) hours. Indication:  MSSA foot osteomyelitis Last Day of Therapy:  04/24/24 Labs - Once weekly:  CBC/D and CMP, Labs - Once weekly: ESR and CRP Fax weekly lab results  promptly to [phone] & [phone] Method of administration: IV Push Method of administration may be changed at the discretion of the facility and/or its pharmacy. Please pull PIC at completion of IV antibiotics  Call [phone] with any critical value or questions 03/20/24 04/25/24 Yes Pace, Brien R, NP  cyanocobalamin  (VITAMIN B12) 1000 MCG tablet Take 1 tablet (1,000 mcg total) by mouth daily. 11/08/23  Yes Laurita Pillion, MD  Dulaglutide  (TRULICITY ) 0.75 MG/0.5ML SOAJ Inject 0.75 mg into the skin once a week. Every Monday   Yes [provider]  ELIQUIS  5 MG TABS tablet TAKE 1 TABLET(5 MG) BY MOUTH TWICE DAILY 03/03/24  Yes Brown, Fallon E, NP  gabapentin  (NEURONTIN ) 300 MG capsule Take 1 capsule (300 mg total) by mouth 3 (three) times daily. 09/05/22  Yes Love, Sharlet RAMAN, PA-C  heparin  flush 10 UNIT/ML injection Inject 5 mLs into the vein once. CVAD: FLUSH IV PORT DAILY   Yes [provider]  insulin  glargine (LANTUS ) 100 UNIT/ML Solostar Pen Inject 28 Units into the skin at bedtime. Patient taking differently: Inject 28 Units into the skin daily. 11/08/23  Yes Laurita Pillion, MD  metFORMIN  (GLUCOPHAGE -XR) 500 MG 24 hr tablet Take 1,000 mg by  mouth 2 (two) times daily with a meal.   Yes [provider]  metoprolol  tartrate (LOPRESSOR ) 25 MG tablet Take 0.5 tablets (12.5 mg total) by mouth 2 (two) times daily. 01/16/24  Yes Ponnala, Shruthi, MD  naloxone (NARCAN) nasal spray 4 mg/0.1 mL Place 1 spray into the nose 3 (three) times daily as needed.   Yes [provider]  oxyCODONE  (OXY IR/ROXICODONE ) 5 MG immediate release tablet Take 1-2 tablets (5-10 mg total) by mouth every 4 (four) hours as needed for moderate pain (pain score 4-6) or severe pain (pain score 7-10). 03/20/24  Yes Pace, Brien R, NP  pravastatin  (PRAVACHOL ) 80 MG tablet Take 80 mg by mouth daily. 05/30/23  Yes [provider]  traZODone  (DESYREL ) 50 MG tablet Take 50 mg by mouth at bedtime. 10/21/18  Yes [provider]  cefadroxil  (DURICEF) 500 MG capsule Take 2 capsules (1,000 mg total) by mouth 2 (two) times daily. Patient not taking: Reported on 04/14/2024 04/20/24  Fayette Bodily, MD  Continuous Glucose Sensor (FREESTYLE LIBRE 2 SENSOR) MISC  09/05/22   [provider]  glucose blood (ONETOUCH ULTRA) test strip Use 3 (three) times daily 09/09/18   [provider]  Insulin  Pen Needle (FIFTY50 PEN NEEDLES) 32G X 4 MM MISC USE 4 TIMES DAILY 07/29/18   [provider]  OZEMPIC, 1 MG/DOSE, 4 MG/3ML SOPN Inject 1 mg into the skin once a week. Patient not taking: Reported on 04/14/2024 02/27/24   [provider]    Results for orders placed or performed during the hospital encounter of 04/14/24 (from the past 48 hours)  Comprehensive metabolic panel     Status: Abnormal   Collection Time: 04/14/24 10:03 AM  Result Value Ref Range   Sodium 140 135 - 145 mmol/L   Potassium 4.4 3.5 - 5.1 mmol/L   Chloride 104 98 - 111 mmol/L   CO2 25 22 - 32 mmol/L   Glucose, Bld 117 (H) 70 - 99 mg/dL    Comment: Glucose reference range applies only to samples taken after fasting for at least 8 hours.   BUN 12 8 -  23 mg/dL   Creatinine, Ser 8.72 (H) 0.61 - 1.24 mg/dL   Calcium  9.8 8.9 - 10.3 mg/dL   Total Protein 7.4 6.5 - 8.1 g/dL   Albumin 4.3 3.5 - 5.0 g/dL   AST 27 15 - 41 U/L    Comment: HEMOLYSIS AT THIS LEVEL MAY AFFECT RESULT   ALT <5 0 - 44 U/L   Alkaline Phosphatase 83 38 - 126 U/L   Total Bilirubin 0.4 0.0 - 1.2 mg/dL   GFR, Estimated 59 (L) >60 mL/min    Comment: (NOTE) Calculated using the CKD-EPI Creatinine Equation (2021)    Anion gap 11 5 - 15    Comment: Performed at [facility], [address] [place], [place] [address]  CBC with Differential     Status: Abnormal   Collection Time: 04/14/24 10:03 AM  Result Value Ref Range   WBC 4.8 4.0 - 10.5 K/uL   RBC 4.37 4.22 - 5.81 MIL/uL   Hemoglobin 12.4 (L) 13.0 - 17.0 g/dL   HCT 61.9 (L) 60.9 - 47.9 %   MCV 87.0 80.0 - 100.0 fL   MCH 28.4 26.0 - 34.0 pg   MCHC 32.6 30.0 - 36.0 g/dL   RDW 85.0 88.4 - 84.4 %   Platelets 167 150 - 400 K/uL   nRBC 0.0 0.0 - 0.2 %   Neutrophils Relative % 72 %   Neutro Abs 3.4 1.7 - 7.7 K/uL   Lymphocytes Relative 15 %   Lymphs Abs 0.7 0.7 - 4.0 K/uL   Monocytes Relative 9 %   Monocytes Absolute 0.4 0.1 - 1.0 K/uL   Eosinophils Relative 4 %   Eosinophils Absolute 0.2 0.0 - 0.5 K/uL   Basophils Relative 0 %   Basophils Absolute 0.0 0.0 - 0.1 K/uL   Immature Granulocytes 0 %   Abs Immature Granulocytes 0.02 0.00 - 0.07 K/uL    Comment: Performed at [facility], [address] [place], [place] [address]  Lactic acid, plasma     Status: None   Collection Time: 04/14/24 10:03 AM  Result Value Ref Range   Lactic Acid, Venous 1.0 0.5 - 1.9 mmol/L    Comment: Performed at [facility], [address], [place], [place] [address]    CT Head Wo Contrast Result Date: 04/14/2024 EXAM: CT HEAD WITHOUT 04/14/2024 11:10:55  AM TECHNIQUE: CT of the head was performed without the administration of intravenous contrast. Automated exposure control, iterative reconstruction,  and/or weight based adjustment of the mA/kV was utilized to reduce the radiation dose to as low as reasonably achievable. COMPARISON: CT orbit reported separately today (04/14/2024) and prior head CT 08/17/2022. CLINICAL HISTORY: 73 year old male with head trauma, suspected intracranial venous injury, minor head trauma, periorbital cellulitis, facial swelling, and a fall on 04/06/2024. FINDINGS: BRAIN AND VENTRICLES: No acute intracranial hemorrhage. No mass effect or midline shift. No extra-axial fluid collection. No evidence of acute infarct. No hydrocephalus. Brain volume is stable and normal for age. Gray white differentiation is stable and normal for age. No suspicious intracranial vascular hyperdensity. Calcified atherosclerosis at the skull base. ORBITS: No acute abnormality. SINUSES AND MASTOIDS: Chronic paranasal sinus mucosal and mucoperiosteal thickening has not significantly changed. Tympanic cavities and mastoids are well aerated. SOFT TISSUES AND SKULL: Broad based and extensive scalp soft tissue swelling extending from the vertex inferiorly, anteriorly, and laterally on the left. This is contiguous with the abnormality described on orbit CT today separately. No scalp soft tissue gas identified. Calvarium appears stable and intact. No skull fracture. IMPRESSION: 1. Left scalp soft tissue abnormality, contiguous with Orbit CT findings (please see that report). 2. Normal for age non-contrast CT appearance of the brain. Electronically signed by: Helayne Hurst MD 04/14/2024 11:26 AM EST RP Workstation: [id number]   CT Orbits W Contrast Result Date: 04/14/2024 EXAM: CT ORBITS WITH CONTRAST 04/14/2024 11:10:55 AM TECHNIQUE: CT of the orbits was performed with the administration of 75 mL of iohexol  (OMNIPAQUE ) 300 MG/ML solution intravenous contrast. Multiplanar reformatted images are provided for review. Automated exposure control, iterative reconstruction, and/or weight based adjustment of the mA/kV was  utilized to reduce the radiation dose to as low as reasonably achievable. COMPARISON: CT head reported separately today. CLINICAL HISTORY: 73 year old male with periorbital cellulitis, facial swelling, and a fall on 04/06/2024. FINDINGS: ORBITS: Postoperative changes to both globes. Normal extraocular muscles. Normal optic nerve-sheath complexes. Extensive left orbit preseptal space involvement. No postseptal inflammation. Bilateral intraorbital soft tissues remain normal. No hematoma. No mass. Orbit walls appear intact. SOFT TISSUES: Broad based anterior scalp soft tissue swelling and stranding which continues inferiorly around the left orbit. Extensive left orbit preseptal space involvement. And continued confluent soft tissue swelling and stranding continuing lateral to the left orbit and along the visible left anterior face. No soft tissue gas or fluid collection identified. Reactive-appearing left parotid space lymph nodes. Otherwise negative visible parotid, masticator, and parapharyngeal spaces. SINUSES AND MASTOIDS: Minor paranasal sinus mucosal thickening. No layering sinus fluid or hemorrhage. Tympanic cavities and mastoids are well aerated. Ordinary nasal cavity mucosal thickening. BONES: No acute abnormality. VASCULATURE: Major vascular structures in the visible upper neck and at the skull base are enhancing and appear patent. Calcified ICA siphon atherosclerosis. BRAIN: Negative visible brain parenchyma. IMPRESSION: 1. Broad based scalp, preseptal left orbit, and left face soft tissue swelling and inflammation compatible with cellulitis. 2. No postseptal orbital involvement. No soft tissue gas or fluid collection. Electronically signed by: Helayne Hurst MD 04/14/2024 11:22 AM EST RP Workstation: [id number]    Blood pressure (!) 148/94, pulse 79, temperature 98.3 F (36.8 C), temperature source Oral, resp. rate 20, height 6' 3 (1.905 m), weight 86.2 kg, SpO2 100%.  Mental status: Alert and Oriented  x 4, pleasant  External: 2+ erythema and 2+ edema of left upper eyelid, crusting and lesions in (classic for shingles) left sided V1 distribution.  Negative Hutchinson's. Tearing and watering left sided.  1+ hyperemia and conjunctival injection left eye.  Right is normal.  Visual Acuity:  20/70+ J7 [place] near OD  20/70+ J7 near [place]  Pupils:  Equally round/ reactive to light.  No Afferent defect.  Motility:  Full/ orthophoric  Visual Fields:  Full to confrontation OU  IOP:  soft  Anterior Segment:  Conjunctiva:  Normal  OU  Cornea:  Normal  OU  Anterior Chamber: Normal  OU  Lens:   IOLs OU   Assessment/Plan: Herpes Zoster ophthalmicus, left sided.  Not related to his fall.  I don't see signs of intraocular infection.  Recommend standard treatment with antivirals (valtrex  1gram po tid x 10 days).  Ok to use bacitracin or erythromyin ophthalmic ointment to eyelids for comfort and to prevent bacterial superinfection.  Recommend followup outpatient with [facility] in 1 week.  History of cataract surgery Diabetes, no history of diabetic retinopathy.  Adine Novak 04/14/2024, 2:36 PM         [1]  Allergies Allergen Reactions   Cefepime  Itching

## 2024-04-14 NOTE — H&P (Signed)
 History and Physical    Andrew Harding FMW:[id number] DOB: [birth year] DOA: 04/14/2024  PCP: Fernande Ophelia [name], MD (Confirm with patient/family/NH records and if not entered, this has to be entered at [facility] point of entry) Patient coming from: SNF  I have personally briefly reviewed patient's old medical records in [facility]  Chief Complaint: Worsening of left face infection  HPI: Andrew Harding is a 73 y.o. 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  7 days ago, patient sustained a fall in the bathroom and hit his head on the bathroom wall, sustained several abrasions and shallow ulcers.  Initially the wound healing was good.  3 days ago, patient started to have swelling rash on the left forehead and left facial eyelids, especially the eyelid started to swell up for the last 2 days 12 point he had hard time to open left eye but he denied any light sensitivity or vision changes.  He also seen thin discharge with from the wounds.  Denies any fever or chills.  ED Course: Afebrile, nontachycardic blood pressure 140/90 O2 saturation 100% on room air.  CT head and neck showed soft tissue swelling broad-based scalp preseptal left orbital and the left with soft tissue swelling inflammation compatible with cellulitis.  Blood work WBC 4.8, hemoglobin 12.4 BUN 12 creatinine 1.2.  Patient was given vancomycin  in the ER.  Review of Systems: As per HPI otherwise 14 point review of systems negative.    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) 09/03/2023   Atrial fibrillation with  RVR (HCC) 08/19/2022   Bladder neck obstruction    Cellulitis 08/17/2022   Chronic kidney disease    Coronary artery disease    a.) s/p 4v CABG in 2014   Diabetes mellitus without complication (HCC)    Diabetic neuropathy (HCC)    Diabetic peripheral neuropathy (HCC)    Diabetic ulcer of toe of left foot associated with diabetes mellitus of other type, limited to breakdown of skin (HCC) 12/21/2017   Diverticulosis    Gout    Gram-negative bacteremia 05/11/2023   Heart murmur    History of osteomyelitis 05/31/2015   Hypercholesteremia    Hyperlipidemia    Hypertension    Hypotension due to hypovolemia 08/17/2022   Infection of left foot 08/19/2022   MSSA bacteremia 02/28/2023   Open wound of left foot with complication 05/10/2023   Osteomyelitis (HCC) 05/31/2015   Peripheral neuropathy    Postural dizziness with presyncope 02/26/2023   S/P BKA (below knee amputation) unilateral, 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   S/P transmetatarsal amputation of foot, left (HCC) 08/29/2022   Sepsis (HCC) 08/17/2022   Sepsis (HCC) 05/10/2023   Status post amputation of toe of right foot 11/01/2015   Tubular adenoma    Vitamin D  deficiency     Past Surgical History:  Procedure Laterality Date   ABDOMINAL AORTOGRAM W/LOWER EXTREMITY N/A 11/05/2023   Procedure: ABDOMINAL AORTOGRAM W/LOWER EXTREMITY;  Surgeon: Marea Selinda RAMAN, MD;  Location: [facility] INVASIVE CV LAB;  Service: Cardiovascular;  Laterality: N/A;   ACHILLES TENDON  SURGERY Right 09/14/2023   Procedure: TENOTOMY, ACHILLES;  Surgeon: [name], DPM;  Location: [facility] ORS;  Service: Orthopedics/Podiatry;  Laterality: Right;   AMPUTATION Left 08/19/2022   Procedure: TRANSMETATARSAL AMPUTATION LEFT FOOT WITH IRRIGATION AND DEBRIDEMENT;  Surgeon: Lennie Barter, DPM;  Location: [facility] ORS;  Service: Podiatry;  Laterality: Left;   AMPUTATION Left 05/16/2023   Procedure: AMPUTATION BELOW KNEE;  Surgeon: Marea Selinda RAMAN, MD;  Location: [facility]  ORS;  Service: General;  Laterality: Left;   AMPUTATION Right 09/14/2023   Procedure: AMPUTATION, FOOT, RAY;  Surgeon: [name], DPM;  Location: [facility] ORS;  Service: Orthopedics/Podiatry;  Laterality: Right;   AMPUTATION TOE Right 06/01/2015   Procedure: AMPUTATION TOE;  Surgeon: Donnice Cory, DPM;  Location: [facility] ORS;  Service: Podiatry;  Laterality: Right;   AMPUTATION TOE Left 08/11/2022   Procedure: AMPUTATION TOE 2, 3, 4;  Surgeon: [name], DPM;  Location: [facility] ORS;  Service: Podiatry;  Laterality: Left;   APPLICATION OF WOUND VAC Right 03/13/2024   Procedure: APPLICATION, WOUND VAC;  Surgeon: [name], DPM;  Location: [facility] ORS;  Service: Orthopedics/Podiatry;  Laterality: Right;   CATARACT EXTRACTION, BILATERAL     CIRCUMCISION N/A 06/12/2022   Procedure: CIRCUMCISION ADULT;  Surgeon: Penne Knee, MD;  Location: [facility] ORS;  Service: Urology;  Laterality: N/A;   COLONOSCOPY WITH PROPOFOL  N/A 02/14/2016   Procedure: COLONOSCOPY WITH PROPOFOL ;  Surgeon: Gladis RAYMOND Mariner, MD;  Location: [facility] ENDOSCOPY;  Service: Endoscopy;  Laterality: N/A;   COLONOSCOPY WITH PROPOFOL  N/A 01/07/2019   Procedure: COLONOSCOPY WITH PROPOFOL ;  Surgeon: Mariner Gladis RAYMOND, MD;  Location: [facility] ENDOSCOPY;  Service: Endoscopy;  Laterality: N/A;   CORONARY ARTERY BYPASS GRAFT N/A 08/2012   EXCISION PARTIAL PHALANX Right 06/01/2015   Procedure: EXCISION PARTIAL PHALANX /  BONE;  Surgeon: Donnice Cory, DPM;  Location: [facility] ORS;  Service: Podiatry;  Laterality: Right;   FLEXIBLE SIGMOIDOSCOPY N/A 05/29/2016   Procedure: FLEXIBLE SIGMOIDOSCOPY;  Surgeon: Gladis RAYMOND Mariner, MD;  Location: [facility] ENDOSCOPY;  Service: Endoscopy;  Laterality: N/A;   INCISION AND DRAINAGE Left 08/23/2022   Procedure: INCISION AND DRAINAGE;  Surgeon: [name], DPM;  Location: [facility] ORS;  Service: Podiatry;  Laterality: Left;   INCISION AND DRAINAGE OF WOUND Left 09/15/2022   Procedure: 11044 - DEBRIDE BONE and  EXCISION IF 1ST METATARSAL BONE WITH  DELAY PRIMARY CLOSURE;  Surgeon: [name], DPM;  Location: [facility] ORS;  Service: Podiatry;  Laterality: Left;   IRRIGATION AND DEBRIDEMENT FOOT Left 02/28/2023   Procedure: IRRIGATION AND DEBRIDEMENT FOOT;  Surgeon: [name], DPM;  Location: [facility] ORS;  Service: Orthopedics/Podiatry;  Laterality: Left;   IRRIGATION AND DEBRIDEMENT FOOT Right 01/11/2024   Procedure: IRRIGATION AND DEBRIDEMENT FOOT;  Surgeon: [name], DPM;  Location: [facility] ORS;  Service: Orthopedics/Podiatry;  Laterality: Right;   IRRIGATION AND DEBRIDEMENT FOOT Right 03/13/2024   Procedure: IRRIGATION AND DEBRIDEMENT FOOT;  Surgeon: [name], DPM;  Location: [facility] ORS;  Service: Orthopedics/Podiatry;  Laterality: Right;   KNEE ARTHROSCOPY Left    LOWER EXTREMITY ANGIOGRAPHY Left 08/22/2022   Procedure: Lower Extremity Angiography;  Surgeon: Jama Cordella MATSU, MD;  Location: [facility] CV [facility];  Service: Cardiovascular;  Laterality: Left;   LOWER EXTREMITY ANGIOGRAPHY Left 05/11/2023   Procedure: Lower Extremity Angiography;  Surgeon: Marea Selinda RAMAN, MD;  Location: [facility];  Service: Cardiovascular;  Laterality: Left;   LOWER EXTREMITY ANGIOGRAPHY Right 09/11/2023   Procedure: Lower Extremity Angiography;  Surgeon: Jama Cordella MATSU, MD;  Location[facility] INVASIVE  CV LAB;  Service: Cardiovascular;  Laterality: Right;   LOWER EXTREMITY ANGIOGRAPHY Right 01/15/2024   Procedure: Lower Extremity Angiography;  Surgeon: Jama Cordella MATSU, MD;  Location: [facility] INVAS[facility] CV LAB;  Service: Cardiovascular;  Laterality: Right;   LOWER EXTREMITY ANGIOGRAPHY Left 03/11/2024   Procedure: Lower Extremity Angiography;  Surgeon: Jama Cordella MATSU, MD;  Location: [facility] INVAS[facility] CV LAB;  Service: Cardiovascular;  Laterality: Left;   LOWER EXTREMITY INTERVENTION Right 09/11/2023   Procedure: LOWER EXTREMITY INTERVENTION;  Surgeon: Jama Cordella MATSU, MD;  Location: [facility]VAS[facility] LAB;   Service: Cardiovascular;  Laterality: Right;   TEE WITHOUT CARDIOVERSION N/A 03/01/2023   Procedure: TRANSESOPHAGEAL ECHOCARDIOGRAM;  Surgeon: Alluri, Keller BROCKS, MD;  Location: [facility] ORS;  Service: Cardiovascular;  Laterality: N/A;   TRANSMETATARSAL AMPUTATION Right 11/02/2023   Procedure: AMPUTATION, FOOT, TRANSMETATARSAL;  Surgeon: [name], DPM;  Location: [facility] ORS;  Service: Orthopedics/Podiatry;  Laterality: Right;   TRANSMETATARSAL AMPUTATION Right 12/21/2023   Procedure: REVISION, AMPUTATION, FOOT, TRANSMETATARSAL;  Surgeon: [name], DPM;  Location: [facility] ORS;  Service: Orthopedics/Podiatry;  Laterality: Right;   WOUND DEBRIDEMENT Left 09/15/2022   Procedure: 11043 - DEBRIDE SKIN. MUSCLE FASCIA;  Surgeon: [name], DPM;  Location: [facility] ORS;  Service: Podiatry;  Laterality: Left;     reports that he has quit smoking. His smoking use included cigars. He has never used smokeless tobacco. He reports that he does not drink alcohol and does not use drugs.  Allergies[1]  Family History  Problem Relation Age of Onset   Diabetes Mellitus II Mother    CAD Mother     Prior to Admission medications  Medication Sig Start Date End Date Taking? Authorizing Provider  acetaminophen  (TYLENOL ) 325 MG tablet Take 2 tablets (650 mg total) by mouth every 4 (four) hours as needed for headache or mild pain (pain score 1-3). 03/20/24  Yes Pace, Brien R, NP  aspirin  EC 81 MG tablet Take 1 tablet (81 mg total) by mouth daily. Swallow whole. 03/20/24  Yes Pace, Brien R, NP  ceFAZolin  (ANCEF ) IVPB Inject 2 g into the vein every 8 (eight) hours. Indication:  MSSA foot osteomyelitis Last Day of Therapy:  04/24/24 Labs - Once weekly:  CBC/D and CMP, Labs - Once weekly: ESR and CRP Fax weekly lab results  promptly to [phone] & [phone] Method of administration: IV Push Method of administration may be changed at the discretion of the facility and/or its pharmacy. Please pull PIC at  completion of IV antibiotics  Call [phone] with any critical value or questions 03/20/24 04/25/24 Yes Pace, Brien R, NP  cyanocobalamin  (VITAMIN B12) 1000 MCG tablet Take 1 tablet (1,000 mcg total) by mouth daily. 11/08/23  Yes Laurita Pillion, MD  Dulaglutide  (TRULICITY ) 0.75 MG/0.5ML SOAJ Inject 0.75 mg into the skin once a week. Every Monday   Yes [provider]  ELIQUIS  5 MG TABS tablet TAKE 1 TABLET(5 MG) BY MOUTH TWICE DAILY 03/03/24  Yes Brown, Fallon E, NP  gabapentin  (NEURONTIN ) 300 MG capsule Take 1 capsule (300 mg total) by mouth 3 (three) times daily. 09/05/22  Yes Love, Sharlet RAMAN, PA-C  heparin  flush 10 UNIT/ML injection Inject 5 mLs into the vein once. CVAD: FLUSH IV PORT DAILY   Yes [provider]  insulin  glargine (LANTUS ) 100 UNIT/ML Solostar Pen Inject 28 Units into the skin at bedtime. Patient taking differently: Inject 28 Units into the skin daily. 11/08/23  Yes Laurita Pillion, MD  metFORMIN  (GLUCOPHAGE -XR) 500 MG 24 hr tablet Take  1,000 mg by mouth 2 (two) times daily with a meal.   Yes [provider]  metoprolol  tartrate (LOPRESSOR ) 25 MG tablet Take 0.5 tablets (12.5 mg total) by mouth 2 (two) times daily. 01/16/24  Yes Ponnala, Shruthi, MD  naloxone (NARCAN) nasal spray 4 mg/0.1 mL Place 1 spray into the nose 3 (three) times daily as needed.   Yes [provider]  oxyCODONE  (OXY IR/ROXICODONE ) 5 MG immediate release tablet Take 1-2 tablets (5-10 mg total) by mouth every 4 (four) hours as needed for moderate pain (pain score 4-6) or severe pain (pain score 7-10). 03/20/24  Yes Pace, Brien R, NP  pravastatin  (PRAVACHOL ) 80 MG tablet Take 80 mg by mouth daily. 05/30/23  Yes [provider]  traZODone  (DESYREL ) 50 MG tablet Take 50 mg by mouth at bedtime. 10/21/18  Yes [provider]  cefadroxil  (DURICEF) 500 MG capsule Take 2 capsules (1,000 mg total) by mouth 2 (two) times daily. Patient not taking: Reported on 04/14/2024 04/20/24    Fayette Bodily, MD  Continuous Glucose Sensor (FREESTYLE LIBRE 2 SENSOR) MISC  09/05/22   [provider]  glucose blood (ONETOUCH ULTRA) test strip Use 3 (three) times daily 09/09/18   [provider]  Insulin  Pen Needle (FIFTY50 PEN NEEDLES) 32G X 4 MM MISC USE 4 TIMES DAILY 07/29/18   [provider]  OZEMPIC, 1 MG/DOSE, 4 MG/3ML SOPN Inject 1 mg into the skin once a week. Patient not taking: Reported on 04/14/2024 02/27/24   [provider]    Physical Exam: Vitals:   04/14/24 1005 04/14/24 1030 04/14/24 1130 04/14/24 1200  BP: (!) 164/87 134/86 (!) 159/88 (!) 148/94  Pulse: 78 90 79 79  Resp: 15 16 13 20   Temp:      TempSrc:      SpO2: 100% 100% 100% 100%  Weight:      Height:        Constitutional: NAD, calm, comfortable Vitals:   04/14/24 1005 04/14/24 1030 04/14/24 1130 04/14/24 1200  BP: (!) 164/87 134/86 (!) 159/88 (!) 148/94  Pulse: 78 90 79 79  Resp: 15 16 13 20   Temp:      TempSrc:      SpO2: 100% 100% 100% 100%  Weight:      Height:       Eyes: PERRL, lids and conjunctivae normal ENMT: Mucous membranes are moist. Posterior pharynx clear of any exudate or lesions.Normal dentition.  Neck: normal, supple, no masses, no thyromegaly Respiratory: clear to auscultation bilaterally, no wheezing, no crackles. Normal respiratory effort. No accessory muscle use.  Cardiovascular: Regular rate and rhythm, no murmurs / rubs / gallops. No extremity edema. 2+ pedal pulses. No carotid bruits.  Abdomen: no tenderness, no masses palpated. No hepatosplenomegaly. Bowel sounds positive.  Musculoskeletal: no clubbing / cyanosis. No joint deformity upper and lower extremities. Good ROM, no contractures. Normal muscle tone.  Skin: Left forehead macular papular rash with multiple ulcers, some of them crusted with clear dried liquid and some lesion crusted, with small multiple blistered lesions.  Right foot in the wound VAC Neurologic: CN 2-12  grossly intact. Sensation intact, DTR normal. Strength 5/5 in all 4.  Psychiatric: Normal judgment and insight. Alert and oriented x 3. Normal mood.    Labs on Admission: I have personally reviewed following labs and imaging studies  CBC: Recent Labs  Lab 04/14/24 1003  WBC 4.8  NEUTROABS 3.4  HGB 12.4*  HCT 38.0*  MCV 87.0  PLT 167  Basic Metabolic Panel: Recent Labs  Lab 04/14/24 1003  NA 140  K 4.4  CL 104  CO2 25  GLUCOSE 117*  BUN 12  CREATININE 1.27*  CALCIUM  9.8   GFR: Estimated Creatinine Clearance: 61 mL/min (A) (by C-G formula based on SCr of 1.27 mg/dL (H)). Liver Function Tests: Recent Labs  Lab 04/14/24 1003  AST 27  ALT <5  ALKPHOS 83  BILITOT 0.4  PROT 7.4  ALBUMIN 4.3   No results for input(s): LIPASE, AMYLASE in the last 168 hours. No results for input(s): AMMONIA in the last 168 hours. Coagulation Profile: No results for input(s): INR, PROTIME in the last 168 hours. Cardiac Enzymes: No results for input(s): CKTOTAL, CKMB, CKMBINDEX, TROPONINI in the last 168 hours. BNP (last 3 results) No results for input(s): PROBNP in the last 8760 hours. HbA1C: No results for input(s): HGBA1C in the last 72 hours. CBG: No results for input(s): GLUCAP in the last 168 hours. Lipid Profile: No results for input(s): CHOL, HDL, LDLCALC, TRIG, CHOLHDL, LDLDIRECT in the last 72 hours. Thyroid Function Tests: No results for input(s): TSH, T4TOTAL, FREET4, T3FREE, THYROIDAB in the last 72 hours. Anemia Panel: No results for input(s): VITAMINB12, FOLATE, FERRITIN, TIBC, IRON , RETICCTPCT in the last 72 hours. Urine analysis:    Component Value Date/Time   COLORURINE YELLOW (A) 05/10/2023 0937   APPEARANCEUR HAZY (A) 05/10/2023 0937   APPEARANCEUR Hazy (A) 05/30/2022 1026   LABSPEC 1.017 05/10/2023 0937   PHURINE 5.0 05/10/2023 0937   GLUCOSEU 50 (A) 05/10/2023 0937   HGBUR MODERATE (A) 05/10/2023  0937   BILIRUBINUR NEGATIVE 05/10/2023 0937   BILIRUBINUR Negative 05/30/2022 1026   KETONESUR NEGATIVE 05/10/2023 0937   PROTEINUR 100 (A) 05/10/2023 0937   UROBILINOGEN 0.2 04/05/2018 1042   NITRITE NEGATIVE 05/10/2023 0937   LEUKOCYTESUR NEGATIVE 05/10/2023 0937    Radiological Exams on Admission: CT Head Wo Contrast Result Date: 04/14/2024 EXAM: CT HEAD WITHOUT 04/14/2024 11:10:55 AM TECHNIQUE: CT of the head was performed without the administration of intravenous contrast. Automated exposure control, iterative reconstruction, and/or weight based adjustment of the mA/kV was utilized to reduce the radiation dose to as low as reasonably achievable. COMPARISON: CT orbit reported separately today (04/14/2024) and prior head CT 08/17/2022. CLINICAL HISTORY: 73 year old male with head trauma, suspected intracranial venous injury, minor head trauma, periorbital cellulitis, facial swelling, and a fall on 04/06/2024. FINDINGS: BRAIN AND VENTRICLES: No acute intracranial hemorrhage. No mass effect or midline shift. No extra-axial fluid collection. No evidence of acute infarct. No hydrocephalus. Brain volume is stable and normal for age. Gray white differentiation is stable and normal for age. No suspicious intracranial vascular hyperdensity. Calcified atherosclerosis at the skull base. ORBITS: No acute abnormality. SINUSES AND MASTOIDS: Chronic paranasal sinus mucosal and mucoperiosteal thickening has not significantly changed. Tympanic cavities and mastoids are well aerated. SOFT TISSUES AND SKULL: Broad based and extensive scalp soft tissue swelling extending from the vertex inferiorly, anteriorly, and laterally on the left. This is contiguous with the abnormality described on orbit CT today separately. No scalp soft tissue gas identified. Calvarium appears stable and intact. No skull fracture. IMPRESSION: 1. Left scalp soft tissue abnormality, contiguous with Orbit CT findings (please see that report). 2.  Normal for age non-contrast CT appearance of the brain. Electronically signed by: Helayne Hurst MD 04/14/2024 11:26 AM EST RP Workstation: [id number]   CT Orbits W Contrast Result Date: 04/14/2024 EXAM: CT ORBITS WITH CONTRAST 04/14/2024 11:10:55 AM TECHNIQUE: CT of the orbits was  performed with the administration of 75 mL of iohexol  (OMNIPAQUE ) 300 MG/ML solution intravenous contrast. Multiplanar reformatted images are provided for review. Automated exposure control, iterative reconstruction, and/or weight based adjustment of the mA/kV was utilized to reduce the radiation dose to as low as reasonably achievable. COMPARISON: CT head reported separately today. CLINICAL HISTORY: 73 year old male with periorbital cellulitis, facial swelling, and a fall on 04/06/2024. FINDINGS: ORBITS: Postoperative changes to both globes. Normal extraocular muscles. Normal optic nerve-sheath complexes. Extensive left orbit preseptal space involvement. No postseptal inflammation. Bilateral intraorbital soft tissues remain normal. No hematoma. No mass. Orbit walls appear intact. SOFT TISSUES: Broad based anterior scalp soft tissue swelling and stranding which continues inferiorly around the left orbit. Extensive left orbit preseptal space involvement. And continued confluent soft tissue swelling and stranding continuing lateral to the left orbit and along the visible left anterior face. No soft tissue gas or fluid collection identified. Reactive-appearing left parotid space lymph nodes. Otherwise negative visible parotid, masticator, and parapharyngeal spaces. SINUSES AND MASTOIDS: Minor paranasal sinus mucosal thickening. No layering sinus fluid or hemorrhage. Tympanic cavities and mastoids are well aerated. Ordinary nasal cavity mucosal thickening. BONES: No acute abnormality. VASCULATURE: Major vascular structures in the visible upper neck and at the skull base are enhancing and appear patent. Calcified ICA siphon atherosclerosis.  BRAIN: Negative visible brain parenchyma. IMPRESSION: 1. Broad based scalp, preseptal left orbit, and left face soft tissue swelling and inflammation compatible with cellulitis. 2. No postseptal orbital involvement. No soft tissue gas or fluid collection. Electronically signed by: Helayne Hurst MD 04/14/2024 11:22 AM EST RP Workstation: [id number]    EKG: Independently reviewed.  A-fib, rate controlled.  Assessment/Plan Principal Problem:   Facial cellulitis  (please populate well all problems here in Problem List. (For example, if patient is on BP meds at home and you resume or decide to hold them, it is a problem that needs to be her. Same for CAD, COPD, HLD and so on)  Left forehead/face shingles - Case was discussed with on-call infectious disease who recommended we started on acyclovir  IV - Ophthalmology consulted - Airborne precaution  Right foot osteomyelitis - Continue Ancef  - Wound care team consulted for dressing change. - Podiatrist aware  Rate controlled chronic A-fib - Continue Eliquis   Peripheral neuropathy - Stable, continue Neurontin   IIDM -SSI for now  DVT prophylaxis: Eliquis  Code Status: Full code Family Communication: Close friend at bedside Disposition Plan: Patient is coming with left facial shingles requiring IV antiviral treatment as well as inpatient ophthalmology consultation, expect more than 2 midnight hospital stay. Consults called: Ophthalmology Dr. Myrna, ID Dr. Epifanio Admission status: MedSurg admission   Cort ONEIDA Mana MD [facility] Pager [phone]  04/14/2024, 2:09 PM       [1]  Allergies Allergen Reactions   Cefepime  Itching

## 2024-04-14 NOTE — ED Triage Notes (Signed)
 Pt presents to the ED via [facility] from [facility]. EMS was called out today for facial swelling. Pt had a fall on December 7th and hit the left side of his head. Pt was not evaluated after this fall. Pt had an xray of his head on December 13th which showed no fx's. Pt takes Eliquis . Pt is at [facility] for a wound on the right foot and wound vac. Pt A&Ox4.  140/88 CBG 153 - hx DM 99% RA HR 90 Temp 97.9

## 2024-04-14 NOTE — TOC Initial Note (Signed)
 Transition of Care [facility]) - Initial/Assessment Note    Patient Details  Name: Andrew Harding MRN: [id number] Date of Birth: 02-17-1951  Transition of Care [facility]) CM/SW Contact:    Nathanael CHRISTELLA Ring, RN Phone Number: 04/14/2024, 2:54 PM  Clinical Narrative:                 Patient admitted to the hospital with facial cellulitis, suspected shingles, his left eye is completely swollen shut.  CM met with patient at the bedside, introduced self and explained role in DC planning.  [facility], he does not want to go back he wants to go home at discharge.  He thinks that he has used all his days.  He will need home health set up at DC - CM will send out [facility] Referral and reach out to [facility] for IV antibiotics at home.  He has been non weight bearing on the right foot, he has had a wound vac but he believes that they will be discontinuing the wound vac.  He has a left BKA with prosthesis.  At home he has a wheelchair, walker, cane, raised toilets in the bathrooms and a HH shower wand.  He does have a friend that provides transportation and a deputy that comes by and checks on him every morning.  He has a emergency contact, Rosina it is his ex-wife but he still calls her his wife.   TOC will follow.   Expected Discharge Plan: Home w Home Health Services Barriers to Discharge: Continued Medical Work up   Patient Goals and CMS Choice Patient states their goals for this hospitalization and ongoing recovery are:: Wants to go home with home health CMS Medicare.gov Compare Post Acute Care list provided to:: Patient Choice offered to / list presented to : Patient      Expected Discharge Plan and Services   Discharge Planning Services: CM Consult Post Acute Care Choice: Home Health Living arrangements for the past 2 months: Single Family Home                           HH Arranged: RN, PT, OT, Nurse's Aide, IV Antibiotics          Prior Living  Arrangements/Services Living arrangements for the past 2 months: Single Family Home Lives with:: Self Patient language and need for interpreter reviewed:: Yes Do you feel safe going back to the place where you live?: Yes      Need for Family Participation in Patient Care: Yes (Comment) Care giver support system in place?: Yes (comment) Current home services: DME (Wheelchair, Walker, cane, raised toilet seat, HH shower wand) Criminal Activity/Legal Involvement Pertinent to Current Situation/Hospitalization: No - Comment as needed  Activities of Daily Living      Permission Sought/Granted Permission sought to share information with : Case Manager, Other (comment) Permission granted to share information with : Yes, Verbal Permission Granted  Share Information with NAME: Jon Rosina  Permission granted to share info w AGENCY: Home health  Permission granted to share info w Relationship: wife  Permission granted to share info w Contact Information: [phone]  Emotional Assessment Appearance:: Appears stated age Attitude/Demeanor/Rapport: Engaged Affect (typically observed): Accepting Orientation: : Oriented to Self, Oriented to Place, Oriented to  Time, Oriented to Situation Alcohol / Substance Use: Not Applicable Psych Involvement: No (comment)  Admission diagnosis:  Facial cellulitis [L03.211] Patient Active Problem List  Diagnosis Date Noted   Facial cellulitis 04/14/2024   Chronic atrial fibrillation with RVR (HCC) 03/13/2024   Allergic reaction 03/13/2024   HLD (hyperlipidemia) 03/13/2024   CAD (coronary artery disease) 03/13/2024   Wound healing, delayed 03/12/2024   Atherosclerosis of artery of extremity with ulceration (HCC) 03/11/2024   Infection caused by Enterobacter cloacae 01/16/2024   Osteomyelitis of foot, right, acute (HCC) 01/14/2024   Foot infection 01/10/2024   B12 deficiency anemia 11/08/2023   Iron  deficiency anemia 11/07/2023   PAD (peripheral  artery disease) 11/07/2023   Diabetic foot infection (HCC) 11/07/2023   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) 11/01/2023   Osteomyelitis of right foot (HCC) 11/01/2023   Atherosclerosis of native arteries of the extremities with ulceration (HCC) 09/03/2023   Atherosclerosis of native arteries of extremity with intermittent claudication 09/02/2023   S/P BKA (below knee amputation) unilateral, 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   Gram-negative bacteremia 05/11/2023   Sepsis (HCC) 05/10/2023   Open wound of left foot with complication 05/10/2023   MSSA bacteremia 02/28/2023   Controlled type 2 diabetes mellitus with neuropathy (HCC) 02/27/2023   Postural dizziness with presyncope 02/26/2023   Diabetic peripheral neuropathy (HCC)    Wound drainage 09/02/2022   Diabetic foot infection with possible osteomyelitis, left  (HCC) 08/29/2022   S/P transmetatarsal amputation of foot, left (HCC) 08/29/2022   Acute osteomyelitis of left ankle or foot (HCC) 08/24/2022   Medication management 08/24/2022   Diabetic infection of left foot (HCC) 08/24/2022   Infection of left foot 08/19/2022   Atrial fibrillation (HCC) 08/19/2022   Cellulitis 08/17/2022   Hypotension due to hypovolemia 08/17/2022   Gout 04/05/2018   Vitamin D  deficiency, unspecified 04/05/2018   Diabetic ulcer of toe of left foot associated with type 2 diabetes mellitus, limited to breakdown of skin (HCC) 12/21/2017   Status post amputation of toe of right foot 11/01/2015   CKD stage 3a, GFR 45-59 ml/min (HCC) 10/08/2015   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   Osteomyelitis (HCC) 05/31/2015   Type 2 diabetes mellitus with stage 3b chronic kidney disease, with long-term current use of insulin  (HCC) 05/31/2015   History of osteomyelitis 05/31/2015   Benign essential hypertension 09/14/2014   Coronary artery disease 09/17/2012   Mixed hyperlipidemia 09/17/2012   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 [place] [address] Phone: [phone] Fax: [phone]     Social Drivers of Health (SDOH) Social History: SDOH Screenings   Food Insecurity: No Food Insecurity (03/12/2024)  Housing: Low Risk (03/12/2024)  Transportation Needs: No Transportation Needs (03/12/2024)  Utilities: Not At Risk (03/12/2024)  Depression (PHQ2-9): Low Risk (05/03/2023)  Financial Resource Strain: Low Risk  (02/22/2024)   Received from [facility]  Social Connections: Unknown (03/12/2024)  Recent Concern: Social Connections - Moderately Isolated (01/10/2024)  Tobacco Use: Medium Risk (04/14/2024)   SDOH Interventions:     Readmission Risk Interventions    04/14/2024    2:51 PM 03/14/2024    2:43 PM 01/14/2024   10:52 AM  Readmission Risk Prevention Plan  Transportation Screening Complete Complete Complete  PCP or Specialist Appt within 3-5 Days Complete Complete Complete  HRI or Home Care  Consult Complete  Complete  Social Work Consult for Recovery Care Planning/Counseling Complete Complete Complete  Palliative Care Screening Not Applicable Not Applicable Not Applicable  Medication Review Oceanographer) Complete Complete Complete

## 2024-04-14 NOTE — Consult Note (Signed)
 Infectious Disease     Reason for Consult:Facial rash   Referring Physician: Laurita Cort DASEN, MD  Date of Admission:  04/14/2024   Principal Problem:   Facial cellulitis   HPI: Andrew Harding is a 73 y.o. male being today out of facility for MSSA foot osteomyelitis on a planned course of cefazolin  until December 25 admitted with left-sided facial pain and rash.  He had fallen a few days prior and hit his face a little.  It did not hurt or give him trouble but then a few days later and irruption broke up.  It started to spread and he is having redness over his eye and having difficulty opening his eye.  He is not having any fevers or chills.  He had a wound VAC on at the facility and the wound seems to be improving.  Otherwise he was tolerating the cefazolin .  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) 09/03/2023   Atrial fibrillation with RVR (HCC) 08/19/2022   Bladder neck obstruction    Cellulitis 08/17/2022   Chronic kidney disease    Coronary artery disease    a.) s/p 4v CABG in 2014   Diabetes mellitus without complication (HCC)    Diabetic neuropathy (HCC)    Diabetic peripheral neuropathy (HCC)    Diabetic ulcer of toe of left foot associated with diabetes mellitus of other type, limited to breakdown of skin (HCC) 12/21/2017   Diverticulosis    Gout    Gram-negative bacteremia 05/11/2023   Heart murmur    History of osteomyelitis 05/31/2015   Hypercholesteremia    Hyperlipidemia    Hypertension    Hypotension due to hypovolemia 08/17/2022   Infection of left foot 08/19/2022   MSSA bacteremia 02/28/2023   Open wound of left foot with complication 05/10/2023   Osteomyelitis (HCC) 05/31/2015   Peripheral neuropathy    Postural dizziness with presyncope 02/26/2023   S/P BKA (below knee amputation)  unilateral, 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   S/P transmetatarsal amputation of foot, left (HCC) 08/29/2022   Sepsis (HCC) 08/17/2022   Sepsis (HCC) 05/10/2023   Status post amputation of toe of right foot 11/01/2015   Tubular adenoma    Vitamin D  deficiency    Past Surgical History:  Procedure Laterality Date   ABDOMINAL AORTOGRAM W/LOWER EXTREMITY N/A 11/05/2023   Procedure: ABDOMINAL AORTOGRAM W/LOWER EXTREMITY;  Surgeon: Marea Selinda RAMAN, MD;  Location: [facility];  Service: Cardiovascular;  Laterality: N/A;   ACHILLES TENDON SURGERY Right 09/14/2023   Procedure: TENOTOMY, ACHILLES;  Surgeon: [name], DPM;  Location: [facility];  Service: Orthopedics/Podiatry;  Laterality: Right;   AMPUTATION Left 08/19/2022   Procedure: TRANSMETATARSAL AMPUTATION LEFT FOOT WITH IRRIGATION AND DEBRIDEMENT;  Surgeon: Lennie Barter, DPM;  Location: [facility];  Service: Podiatry;  Laterality: Left;   AMPUTATION Left 05/16/2023   Procedure: AMPUTATION BELOW KNEE;  Surgeon: Marea Selinda RAMAN, MD;  Location: [facility];  Service: General;  Laterality: Left;   AMPUTATION Right 09/14/2023   Procedure: AMPUTATION, FOOT, RAY;  Surgeon: [name], DPM;  Location: [facility];  Service: Orthopedics/Podiatry;  Laterality: Right;   AMPUTATION TOE Right 06/01/2015   Procedure: AMPUTATION TOE;  Surgeon: Donnice Cory, DPM;  Location: [facility];  Service: Podiatry;  Laterality: Right;   AMPUTATION TOE Left  08/11/2022   Procedure: AMPUTATION TOE 2, 3, 4;  Surgeon: [name], DPM;  Location: [facility];  Service: Podiatry;  Laterality: Left;   APPLICATION OF WOUND VAC Right 03/13/2024   Procedure: APPLICATION, WOUND VAC;  Surgeon: [name], DPM;  Location: [facility];  Service: Orthopedics/Podiatry;  Laterality: Right;   CATARACT EXTRACTION, BILATERAL     CIRCUMCISION N/A 06/12/2022   Procedure: CIRCUMCISION ADULT;  Surgeon: Penne Knee, MD;  Location: [facility];  Service: Urology;  Laterality:  N/A;   COLONOSCOPY WITH PROPOFOL  N/A 02/14/2016   Procedure: COLONOSCOPY WITH PROPOFOL ;  Surgeon: Gladis RAYMOND Mariner, MD;  Location: [facility];  Service: Endoscopy;  Laterality: N/A;   COLONOSCOPY WITH PROPOFOL  N/A 01/07/2019   Procedure: COLONOSCOPY WITH PROPOFOL ;  Surgeon: Mariner Gladis RAYMOND, MD;  Location: [facility];  Service: Endoscopy;  Laterality: N/A;   CORONARY ARTERY BYPASS GRAFT N/A 08/2012   EXCISION PARTIAL PHALANX Right 06/01/2015   Procedure: EXCISION PARTIAL PHALANX /  BONE;  Surgeon: Donnice Cory, DPM;  Location: [facility];  Service: Podiatry;  Laterality: Right;   FLEXIBLE SIGMOIDOSCOPY N/A 05/29/2016   Procedure: FLEXIBLE SIGMOIDOSCOPY;  Surgeon: Gladis RAYMOND Mariner, MD;  Location: [facility] END[facility];  Service: Endoscopy;  Laterality: N/A;   INCISION AND DRAINAGE Left 08/23/2022   Procedure: INCISION AND DRAINAGE;  Surgeon: [name], DPM;  Location: [facility];  Service: Podiatry;  Laterality: Left;   INCISION AND DRAINAGE OF WOUND Left 09/15/2022   Procedure: 11044 - DEBRIDE BONE and EXCISION IF 1ST METATARSAL BONE WITH  DELAY PRIMARY CLOSURE;  Surgeon: [name], DPM;  Location: [facility];  Service: Podiatry;  Laterality: Left;   IRRIGATION AND DEBRIDEMENT FOOT Left 02/28/2023   Procedure: IRRIGATION AND DEBRIDEMENT FOOT;  Surgeon: [name], DPM;  Location: [facility];  Service: Orthopedics/Podiatry;  Laterality: Left;   IRRIGATION AND DEBRIDEMENT FOOT Right 01/11/2024   Procedure: IRRIGATION AND DEBRIDEMENT FOOT;  Surgeon: [name], DPM;  Location: [facility];  Service: Orthopedics/Podiatry;  Laterality: Right;   IRRIGATION AND DEBRIDEMENT FOOT Right 03/13/2024   Procedure: IRRIGATION AND DEBRIDEMENT FOOT;  Surgeon: [name], DPM;  Location: [facility];  Service: Orthopedics/Podiatry;  Laterality: Right;   KNEE ARTHROSCOPY Left    LOWER EXTREMITY ANGIOGRAPHY Left 08/22/2022   Procedure: Lower Extremity Angiography;  Surgeon: Jama Cordella MATSU, MD;   Location: [facility];  Service: Cardiovascular;  Laterality: Left;   LOWER EXTREMITY ANGIOGRAPHY Left 05/11/2023   Procedure: Lower Extremity Angiography;  Surgeon: Marea Selinda RAMAN, MD;  Location: [facility];  Service: Cardiovascular;  Laterality: Left;   LOWER EXTREMITY ANGIOGRAPHY Right 09/11/2023   Procedure: Lower Extremity Angiography;  Surgeon: Jama Cordella MATSU, MD;  Location: [facility];  Service: Cardiovascular;  Laterality: Right;   LOWER EXTREMITY ANGIOGRAPHY Right 01/15/2024   Procedure: Lower Extremity Angiography;  Surgeon: Jama Cordella MATSU, MD;  Location: [facility];  Service: Cardiovascular;  Laterality: Right;   LOWER EXTREMITY ANGIOGRAPHY Left 03/11/2024   Procedure: Lower Extremity Angiography;  Surgeon: Jama Cordella MATSU, MD;  Location: [facility];  Service: Cardiovascular;  Laterality: Left;   LOWER EXTREMITY INTERVENTION Right 09/11/2023   Procedure: LOWER EXTREMITY INTERVENTION;  Surgeon: Jama Cordella MATSU, MD;  Location: [facility];  Service: Cardiovascular;  Laterality: Right;   TEE WITHOUT CARDIOVERSION N/A 03/01/2023   Procedure: TRANSESOPHAGEAL ECHOCARDIOGRAM;  Surgeon: Wilburn Keller BROCKS, MD;  Location: [facility];  Service: Cardiovascular;  Laterality: N/A;   TRANSMETATARSAL AMPUTATION Right 11/02/2023   Procedure:  AMPUTATION, FOOT, TRANSMETATARSAL;  Surgeon: [name], DPM;  Location: [facility];  Service: Orthopedics/Podiatry;  Laterality: Right;   TRANSMETATARSAL AMPUTATION Right 12/21/2023   Procedure: REVISION, AMPUTATION, FOOT, TRANSMETATARSAL;  Surgeon: [name], DPM;  Location: [facility];  Service: Orthopedics/Podiatry;  Laterality: Right;   WOUND DEBRIDEMENT Left 09/15/2022   Procedure: 11043 - DEBRIDE SKIN. MUSCLE FASCIA;  Surgeon: [name], DPM;  Location: [facility];  Service: Podiatry;  Laterality: Left;   Social History[1] Family History  Problem Relation Age of Onset   Diabetes Mellitus  II Mother    CAD Mother     Allergies: Allergies[2]  Current antibiotics: Antibiotics Given (last 72 hours)     Date/Time Action Medication Dose Rate   04/14/24 1014 New Bag/Given   vancomycin  (VANCOCIN ) IVPB 1000 mg/200 mL premix 1,000 mg 200 mL/hr       MEDICATIONS:  apixaban   5 mg Oral BID   [START ON 04/15/2024] aspirin  EC  81 mg Oral Daily   gabapentin   300 mg Oral TID   insulin  aspart  0-9 Units Subcutaneous TID WC   metoprolol  tartrate  12.5 mg Oral BID   pravastatin   80 mg Oral QHS   traZODone   50 mg Oral QHS   valACYclovir   1,000 mg Oral TID    Review of Systems - 11 systems reviewed and negative per HPI   OBJECTIVE: Temp:  [98.3 F (36.8 C)] 98.3 F (36.8 C) (12/15 1001) Pulse Rate:  [78-90] 79 (12/15 1200) Resp:  [13-20] 20 (12/15 1200) BP: (134-164)/(86-94) 148/94 (12/15 1200) SpO2:  [100 %] 100 % (12/15 1200) Weight:  [86.2 kg] 86.2 kg (12/15 0959) Physical Exam  Constitutional: He is oriented to person, place, and time. He appears well-developed and well-nourished. No distress.  HENT: facial scabs and erythematous lesions not crossing midline> L eye edema and erythema. Able to open  L eye with his fingers and can read my nametag Mouth/Throat: Oropharynx is clear and moist. No oropharyngeal exudate.  Cardiovascular: Normal rate, regular rhythm and normal heart sounds.  PICC Line UE wnl Pulmonary/Chest: Effort normal and breath sounds normal. No respiratory distress. He has no wheezes.  Abdominal: Soft. Bowel sounds are normal. He exhibits no distension. There is no tenderness.  Lymphadenopathy:  He has no cervical adenopathy.  Neurological: He is alert and oriented to person, place, and time.  Skin: facial rash as above and in photo. Foot wounds as per photo- relatively clean Psychiatric: He has a normal mood and affect. His behavior is normal.                   LABS: Results for orders placed or performed during the hospital  encounter of 04/14/24 (from the past 48 hours)  Comprehensive metabolic panel     Status: Abnormal   Collection Time: 04/14/24 10:03 AM  Result Value Ref Range   Sodium 140 135 - 145 mmol/L   Potassium 4.4 3.5 - 5.1 mmol/L   Chloride 104 98 - 111 mmol/L   CO2 25 22 - 32 mmol/L   Glucose, Bld 117 (H) 70 - 99 mg/dL    Comment: Glucose reference range applies only to samples taken after fasting for at least 8 hours.   BUN 12 8 - 23 mg/dL   Creatinine, Ser 8.72 (H) 0.61 - 1.24 mg/dL   Calcium  9.8 8.9 - 10.3 mg/dL   Total Protein 7.4 6.5 - 8.1 g/dL   Albumin 4.3 3.5 - 5.0 g/dL   AST 27 15 -  41 U/L    Comment: HEMOLYSIS AT THIS LEVEL MAY AFFECT RESULT   ALT <5 0 - 44 U/L   Alkaline Phosphatase 83 38 - 126 U/L   Total Bilirubin 0.4 0.0 - 1.2 mg/dL   GFR, Estimated 59 (L) >60 mL/min    Comment: (NOTE) Calculated using the CKD-EPI Creatinine Equation (2021)    Anion gap 11 5 - 15    Comment: Performed at [facility], [address] [place], [place] [address]  CBC with Differential     Status: Abnormal   Collection Time: 04/14/24 10:03 AM  Result Value Ref Range   WBC 4.8 4.0 - 10.5 K/uL   RBC 4.37 4.22 - 5.81 MIL/uL   Hemoglobin 12.4 (L) 13.0 - 17.0 g/dL   HCT 61.9 (L) 60.9 - 47.9 %   MCV 87.0 80.0 - 100.0 fL   MCH 28.4 26.0 - 34.0 pg   MCHC 32.6 30.0 - 36.0 g/dL   RDW 85.0 88.4 - 84.4 %   Platelets 167 150 - 400 K/uL   nRBC 0.0 0.0 - 0.2 %   Neutrophils Relative % 72 %   Neutro Abs 3.4 1.7 - 7.7 K/uL   Lymphocytes Relative 15 %   Lymphs Abs 0.7 0.7 - 4.0 K/uL   Monocytes Relative 9 %   Monocytes Absolute 0.4 0.1 - 1.0 K/uL   Eosinophils Relative 4 %   Eosinophils Absolute 0.2 0.0 - 0.5 K/uL   Basophils Relative 0 %   Basophils Absolute 0.0 0.0 - 0.1 K/uL   Immature Granulocytes 0 %   Abs Immature Granulocytes 0.02 0.00 - 0.07 K/uL    Comment: Performed at [facility], [address] [place], [place] [address]  Lactic acid, plasma     Status: None    Collection Time: 04/14/24 10:03 AM  Result Value Ref Range   Lactic Acid, Venous 1.0 0.5 - 1.9 mmol/L    Comment: Performed at [facility], [address] [place], [place] [address]   No components found for: ESR, C REACTIVE PROTEIN MICRO: No results found for this or any previous visit (from the past 720 hours).  IMAGING: CT Head Wo Contrast Result Date: 04/14/2024 EXAM: CT HEAD WITHOUT 04/14/2024 11:10:55 AM TECHNIQUE: CT of the head was performed without the administration of intravenous contrast. Automated exposure control, iterative reconstruction, and/or weight based adjustment of the mA/kV was utilized to reduce the radiation dose to as low as reasonably achievable. COMPARISON: CT orbit reported separately today (04/14/2024) and prior head CT 08/17/2022. CLINICAL HISTORY: 73 year old male with head trauma, suspected intracranial venous injury, minor head trauma, periorbital cellulitis, facial swelling, and a fall on 04/06/2024. FINDINGS: BRAIN AND VENTRICLES: No acute intracranial hemorrhage. No mass effect or midline shift. No extra-axial fluid collection. No evidence of acute infarct. No hydrocephalus. Brain volume is stable and normal for age. Gray white differentiation is stable and normal for age. No suspicious intracranial vascular hyperdensity. Calcified atherosclerosis at the skull base. ORBITS: No acute abnormality. SINUSES AND MASTOIDS: Chronic paranasal sinus mucosal and mucoperiosteal thickening has not significantly changed. Tympanic cavities and mastoids are well aerated. SOFT TISSUES AND SKULL: Broad based and extensive scalp soft tissue swelling extending from the vertex inferiorly, anteriorly, and laterally on the left. This is contiguous with the abnormality described on orbit CT today separately. No scalp soft tissue gas identified. Calvarium appears stable and intact. No skull fracture. IMPRESSION: 1. Left scalp soft tissue abnormality, contiguous with Orbit CT  findings (please  see that report). 2. Normal for age non-contrast CT appearance of the brain. Electronically signed by: Helayne Hurst MD 04/14/2024 11:26 AM EST RP Workstation: [id number]   CT Orbits W Contrast Result Date: 04/14/2024 EXAM: CT ORBITS WITH CONTRAST 04/14/2024 11:10:55 AM TECHNIQUE: CT of the orbits was performed with the administration of 75 mL of iohexol  (OMNIPAQUE ) 300 MG/ML solution intravenous contrast. Multiplanar reformatted images are provided for review. Automated exposure control, iterative reconstruction, and/or weight based adjustment of the mA/kV was utilized to reduce the radiation dose to as low as reasonably achievable. COMPARISON: CT head reported separately today. CLINICAL HISTORY: 73 year old male with periorbital cellulitis, facial swelling, and a fall on 04/06/2024. FINDINGS: ORBITS: Postoperative changes to both globes. Normal extraocular muscles. Normal optic nerve-sheath complexes. Extensive left orbit preseptal space involvement. No postseptal inflammation. Bilateral intraorbital soft tissues remain normal. No hematoma. No mass. Orbit walls appear intact. SOFT TISSUES: Broad based anterior scalp soft tissue swelling and stranding which continues inferiorly around the left orbit. Extensive left orbit preseptal space involvement. And continued confluent soft tissue swelling and stranding continuing lateral to the left orbit and along the visible left anterior face. No soft tissue gas or fluid collection identified. Reactive-appearing left parotid space lymph nodes. Otherwise negative visible parotid, masticator, and parapharyngeal spaces. SINUSES AND MASTOIDS: Minor paranasal sinus mucosal thickening. No layering sinus fluid or hemorrhage. Tympanic cavities and mastoids are well aerated. Ordinary nasal cavity mucosal thickening. BONES: No acute abnormality. VASCULATURE: Major vascular structures in the visible upper neck and at the skull base are enhancing and appear patent.  Calcified ICA siphon atherosclerosis. BRAIN: Negative visible brain parenchyma. IMPRESSION: 1. Broad based scalp, preseptal left orbit, and left face soft tissue swelling and inflammation compatible with cellulitis. 2. No postseptal orbital involvement. No soft tissue gas or fluid collection. Electronically signed by: Helayne Hurst MD 04/14/2024 11:22 AM EST RP Workstation: [id number]   DG Chest Port 1 View Result Date: 03/20/2024 EXAM: 1 VIEW(S) XRAY OF THE CHEST 03/20/2024 10:50:00 AM COMPARISON: 01/13/2024 CLINICAL HISTORY: PICC (peripherally inserted central catheter) in place FINDINGS: LINES, TUBES AND DEVICES: Right upper extremity PICC retracted since the prior radiograph. The tip is slightly obscured by sternotomy wires but appears to be within the mid SVC. LUNGS AND PLEURA: Slightly low lung volumes. Left basilar atelectasis. No pleural effusion. No pneumothorax. HEART AND MEDIASTINUM: Sequelae of median sternotomy and CABG. Aortic arch calcifications. No acute abnormality of the cardiac and mediastinal silhouettes. BONES AND SOFT TISSUES: Sternotomy wires are present. No acute osseous abnormality. IMPRESSION: 1. Right upper extremity PICC tip appears to be within the mid SVC, slightly obscured by sternotomy wires. 2. Slightly low lung volumes with left basilar atelectasis. Electronically signed by: Donnice Mania MD 03/20/2024 11:32 AM EST RP Workstation: [id number]   US  EKG SITE RITE Result Date: 03/19/2024 If Site Rite image not attached, placement could not be confirmed due to current cardiac rhythm.   Assessment:   Andrew Harding is a 73 y.o. male being treated for MSSA diabetic foot osteomyelitis with cefazolin  now with left-sided facial rash for several days does not cross the midline.  He has eye swelling as well.  This is consistent with acute shingles.  His foot seems to be improving  Recommendations Facial shingles-start IV acyclovir .  I have ordered a swab PCR to confirm it although I  think this is very likely the cause Ophtho consult ASAP.  Monitor for worsening and/or bacterial superinfection.  Diabetic foot infection.  Please see Dr. Nettie  note.  He is to finish a course of cefazolin  December 25 but the plan was to shorten that as it is improving and changed to orals.  We can likely finish his IV course here and have him change over to oral cefadroxil  at discharge.  We could remove the PICC line if that is the case     Thank you very much for allowing me to participate in the care of this patient. Please call with questions.   Alm SQUIBB. Epifanio, MD       [1]  Social History Tobacco Use   Smoking status: Former    Types: Cigars   Smokeless tobacco: Never  Vaping Use   Vaping status: Never Used  Substance Use Topics   Alcohol use: No   Drug use: No  [2]  Allergies Allergen Reactions   Cefepime  Itching

## 2024-04-14 NOTE — Consult Note (Signed)
 WOC Nurse Consult Note: Reason for Consult: Foot wound va. LEft facial cellulitis  Wound type: Unstageable Pressure Injury: right lateral malleolus; device related pressure injury from offloading boot; 100% dark/scabbed; less than 0.5cm, no depth Unstageable Pressure Injury; right heel; 100% eschar; 1cm x 1cm x 0cm  Healing Stage 2 Pressure Injury; right medial malleolus; device related from offloading boot; 100% pink Deep Tissue Pressure Injury: dorsal foot; device related; NPWT tubing; dark purple non blanchable  Surgical wound; lateral transmetatarsal site right LE; 0.3cm x 2.0cm x 0.3cm; 70% granulation tissue/ 30% fibrinous yellow  Surgical wound; medial transmetatarsal site; sutures; closed  Pressure Injury POA: Yes Measurement:see above  Wound bed:see above  Drainage (amount, consistency, odor) scant from all sites Periwound: intact  Dressing procedure/placement/frequency: Paint right heel and right lateral ankle wound with betadine  and allow to air dry.  Cover wounds with foam Saline moist gauze to the right lateral transmet site, change BID. Could use hydrogel to lessen likelihood of periwound maceration   Discussed POC with patient and bedside nurse, ID, and podiatry Re consult if needed, will not follow at this time. Thanks  [employer], RN,CWOCN, CNS, [employer] [phone]

## 2024-04-15 DIAGNOSIS — L03211 Cellulitis of face: Secondary | ICD-10-CM | POA: Diagnosis not present

## 2024-04-15 DIAGNOSIS — B028 Zoster with other complications: Secondary | ICD-10-CM | POA: Diagnosis not present

## 2024-04-15 DIAGNOSIS — B029 Zoster without complications: Secondary | ICD-10-CM

## 2024-04-15 LAB — CBC
HCT: 32 % — ABNORMAL LOW (ref 39.0–52.0)
Hemoglobin: 10.6 g/dL — ABNORMAL LOW (ref 13.0–17.0)
MCH: 28.1 pg (ref 26.0–34.0)
MCHC: 33.1 g/dL (ref 30.0–36.0)
MCV: 84.9 fL (ref 80.0–100.0)
Platelets: 137 K/uL — ABNORMAL LOW (ref 150–400)
RBC: 3.77 MIL/uL — ABNORMAL LOW (ref 4.22–5.81)
RDW: 14.9 % (ref 11.5–15.5)
WBC: 4 K/uL (ref 4.0–10.5)
nRBC: 0 % (ref 0.0–0.2)

## 2024-04-15 LAB — BASIC METABOLIC PANEL WITH GFR
Anion gap: 10 (ref 5–15)
BUN: 11 mg/dL (ref 8–23)
CO2: 24 mmol/L (ref 22–32)
Calcium: 8.5 mg/dL — ABNORMAL LOW (ref 8.9–10.3)
Chloride: 100 mmol/L (ref 98–111)
Creatinine, Ser: 1.04 mg/dL (ref 0.61–1.24)
GFR, Estimated: >60 mL/min (ref >60)
Glucose, Bld: 172 mg/dL — ABNORMAL HIGH (ref 70–99)
Potassium: 4.2 mmol/L (ref 3.5–5.1)
Sodium: 135 mmol/L (ref 135–145)

## 2024-04-15 LAB — GLUCOSE, CAPILLARY
Glucose-Capillary: 133 mg/dL — ABNORMAL HIGH (ref 70–99)
Glucose-Capillary: 180 mg/dL — ABNORMAL HIGH (ref 70–99)
Glucose-Capillary: 133 mg/dL — ABNORMAL HIGH (ref 70–99)
Glucose-Capillary: 124 mg/dL — ABNORMAL HIGH (ref 70–99)

## 2024-04-15 LAB — VARICELLA-ZOSTER BY PCR: Varicella-Zoster, PCR: POSITIVE — AB

## 2024-04-15 MED ORDER — METFORMIN HCL ER 500 MG PO TB24
1000.0000 mg | ORAL_TABLET | Freq: Two times a day (BID) | ORAL | Status: DC
  Administered 2024-04-16 – 2024-04-20 (×9): 1000 mg via ORAL
  Filled 2024-04-15 (×10): qty 2

## 2024-04-15 MED ORDER — INSULIN GLARGINE 100 UNIT/ML ~~LOC~~ SOLN
24.0000 [IU] | Freq: Every day | SUBCUTANEOUS | Status: DC
  Administered 2024-04-15 – 2024-04-19 (×5): 24 [IU] via SUBCUTANEOUS
  Filled 2024-04-15 (×6): qty 0.24

## 2024-04-15 NOTE — Progress Notes (Addendum)
 Progress Note   [name] FMW:[id number] DOB: Jun 28, 1950 DOA: 04/14/2024     1 DOS: the patient was seen and examined on 04/15/2024   Brief hospital course: Per H&P HPI   HPI: Andrew Harding is a 73 y.o. male with medical history significant of IIDM, peripheral neuropathy, CAD, status post CABG, HTN, PAF on Eliquis , status post right TMA with chronic residual foot ulcer infection and osteomyelitis on wound VAC and chronic antibiotic therapy via PICC line, presented with worsening of left facial infection.   7 days ago, patient sustained a fall in the bathroom and hit his head on the bathroom wall, sustained several abrasions and shallow ulcers.  Initially the wound healing was good.  3 days ago, patient started to have swelling rash on the left forehead and left facial eyelids, especially the eyelid started to swell up for the last 2 days 12 point he had hard time to open left eye but he denied any light sensitivity or vision changes.  He also seen thin discharge with from the wounds.  Denies any fever or chills.   ED Course: Afebrile, nontachycardic blood pressure 140/90 O2 saturation 100% on room air.  CT head and neck showed soft tissue swelling broad-based scalp preseptal left orbital and the left with soft tissue swelling inflammation compatible with cellulitis.  Blood work WBC 4.8, hemoglobin 12.4 BUN 12 creatinine 1.2.   Patient was given vancomycin  in the ER.  Assessment and Plan: Left sided herpes zoster ophtlamicus - Case was discussed with on-call infectious disease who recommended acyclovir  IV, ophthalmology consulted  - Continue valtrex  1g PO TID for 10d Erythromycin   ointment to eyelids for comfort. F/u with [facility] in 1 week.  - Airborne precaution   Diabetic foot ulcerations  R foot osteomyelitis status post right fifth metatarsal amputaiton  Follows with infectious disease and wound care outpatient.   Continue ancef   Will need wound vac replaced, WOC  consulted.    Chonric afib  Rate controlled  Continue eliquis  and metoprolol    T2DM insulin  dependent  A1c 7.1.  On Lantus  24 units at home, metformin  twice daily, and dulaglutide . continue lantus  24u, metformin  + SSI.   Normocytic anemia  Appears to be at baseline hgb.  Will check iron  panel    PVD s/p ngioplasty and stent placement to the right popliteal and anterior tibial arteries  Continue Asprin, statin     Subjective: Feels eye pain is improved as well as swelling.   Physical Exam: Vitals:   04/14/24 1130 04/14/24 1200 04/14/24 1944 04/15/24 0400  BP: (!) 159/88 (!) 148/94 120/80 121/82  Pulse: 79 79 86 80  Resp: 13 20 17 17   Temp:   99.1 F (37.3 C) 99.1 F (37.3 C)  TempSrc:   Oral Oral  SpO2: 100% 100% 99% 98%  Weight:      Height:       Physical Exam  Constitutional: In no distress.  Eyes: L eye eyelids edematous Cardiovascular: Normal rate, regular rhythm. No lower extremity edema  Pulmonary: Non labored breathing on room air, no wheezing or rales.   Abdominal: Soft. Non distended and non tender Musculoskeletal: s/p L BKA, s/p R TMA With ulcerations multiple bandages.     Neurological: Alert and oriented to person, place, and time. Non focal  Skin: Skin is warm and dry.  Maculopapular rash of forehead and L eyelids, erythema, some blood crusting of the lesions.   Data Reviewed:     Latest Ref  Rng & Units 04/14/2024   10:03 AM 03/18/2024    4:43 AM 03/17/2024    4:55 AM  BMP  Glucose 70 - 99 mg/dL 882  820  811   BUN 8 - 23 mg/dL 12  11  11    Creatinine 0.61 - 1.24 mg/dL 8.72  8.82  8.91   Sodium 135 - 145 mmol/L 140  139  139   Potassium 3.5 - 5.1 mmol/L 4.4  3.9  4.1   Chloride 98 - 111 mmol/L 104  104  106   CO2 22 - 32 mmol/L 25  25  27    Calcium  8.9 - 10.3 mg/dL 9.8  8.6  8.5       Latest Ref Rng & Units 04/14/2024   10:03 AM 03/16/2024    5:23 AM 03/15/2024    5:48 AM  CBC  WBC 4.0 - 10.5 K/uL 4.8  6.6  6.8   Hemoglobin 13.0 -  17.0 g/dL 87.5  89.7  89.7   Hematocrit 39.0 - 52.0 % 38.0  31.7  30.1   Platelets 150 - 400 K/uL 167  222  212      Family Communication: None at bedside   Disposition: Status is: Inpatient Remains inpatient appropriate because: IV antiviral   Planned Discharge Destination: Home    Time spent: 35 minutes  Author: Alban Pepper, MD 04/15/2024 9:06 AM  For on call review [email or web address].

## 2024-04-15 NOTE — Plan of Care (Signed)

## 2024-04-15 NOTE — Progress Notes (Signed)
 INFECTIOUS DISEASE PROGRESS NOTE Date of Admission:  04/14/2024     ID: Andrew Harding is a 73 y.o. male with  shingles, DFI  Principal Problem:   Facial cellulitis   Subjective: No fevers, wbc nml. Seen by ophtho and no evidence intraocular infection. Still cannot open eye  ROS  Eleven systems are reviewed and negative except per hpi  Medications:  Antibiotics Given (last 72 hours)     Date/Time Action Medication Dose Rate   04/14/24 1014 New Bag/Given   vancomycin  (VANCOCIN ) IVPB 1000 mg/200 mL premix 1,000 mg 200 mL/hr   04/14/24 1427 New Bag/Given   ceFAZolin  (ANCEF ) IVPB 2g/100 mL premix 2 g 200 mL/hr   04/14/24 1816 New Bag/Given   acyclovir  (ZOVIRAX ) 860 mg in dextrose  5 % 250 mL IVPB 860 mg 267.2 mL/hr   04/14/24 2148 New Bag/Given   ceFAZolin  (ANCEF ) IVPB 2g/100 mL premix 2 g 200 mL/hr   04/15/24 9786 New Bag/Given   acyclovir  (ZOVIRAX ) 860 mg in dextrose  5 % 250 mL IVPB 860 mg 267.2 mL/hr   04/15/24 9385 New Bag/Given   ceFAZolin  (ANCEF ) IVPB 2g/100 mL premix 2 g 200 mL/hr   04/15/24 1012 New Bag/Given   acyclovir  (ZOVIRAX ) 860 mg in dextrose  5 % 250 mL IVPB 860 mg 267.2 mL/hr   04/15/24 1443 New Bag/Given   ceFAZolin  (ANCEF ) IVPB 2g/100 mL premix 2 g 200 mL/hr       apixaban   5 mg Oral BID   aspirin  EC  81 mg Oral Daily   Chlorhexidine  Gluconate Cloth  6 each Topical Daily   erythromycin    Left Eye Q8H   gabapentin   300 mg Oral TID   insulin  aspart  0-9 Units Subcutaneous TID WC   insulin  glargine  24 Units Subcutaneous Q2200   metoprolol  tartrate  12.5 mg Oral BID   pravastatin   80 mg Oral QHS   sodium chloride  flush  10-40 mL Intracatheter Q12H   traZODone   50 mg Oral QHS    Objective: Vital signs in last 24 hours: Temp:  [99.1 F (37.3 C)] 99.1 F (37.3 C) (12/16 0400) Pulse Rate:  [80-86] 80 (12/16 0400) Resp:  [17] 17 (12/16 0400) BP: (120-121)/(80-82) 121/82 (12/16 0400) SpO2:  [98 %-99 %] 98 % (12/16 0400) Constitutional: He is oriented to  person, place, and time. He appears well-developed and well-nourished. No distress.  HENT: facial scabs and erythematous lesions not crossing midline> L eye edema and erythema. Able to open  L eye with his fingers and can read my nametag Mouth/Throat: Oropharynx is clear and moist. No oropharyngeal exudate.  Cardiovascular: Normal rate, regular rhythm and normal heart sounds.  PICC Line UE wnl Pulmonary/Chest: Effort normal and breath sounds normal. No respiratory distress. He has no wheezes.  Abdominal: Soft. Bowel sounds are normal. He exhibits no distension. There is no tenderness.  Lymphadenopathy:  He has no cervical adenopathy.  Neurological: He is alert and oriented to person, place, and time.  Skin: facial rash as above and in photo. Foot wounds as per photo- relatively clean Psychiatric: He has a normal mood and affect. His behavior is normal.   Lab Results Recent Labs    04/14/24 1003 04/15/24 0947  WBC 4.8 4.0  HGB 12.4* 10.6*  HCT 38.0* 32.0*  NA 140 135  K 4.4 4.2  CL 104 100  CO2 25 24  BUN 12 11  CREATININE 1.27* 1.04    Microbiology: Results for orders placed or performed during the hospital  encounter of 04/14/24  Culture, blood (routine x 2)     Status: None (Preliminary result)   Collection Time: 04/14/24 10:03 AM   Specimen: BLOOD  Result Value Ref Range Status   Specimen Description BLOOD BLOOD LEFT FOREARM  Final   Special Requests   Final    BOTTLES DRAWN AEROBIC AND ANAEROBIC Blood Culture adequate volume   Culture   Final    NO GROWTH < 24 HOURS Performed at [facility], [address] [place], [place] [address]    Report Status PENDING  Incomplete  Culture, blood (routine x 2)     Status: None (Preliminary result)   Collection Time: 04/14/24 10:03 AM   Specimen: BLOOD  Result Value Ref Range Status   Specimen Description BLOOD BLOOD RIGHT FOREARM  Final   Special Requests   Final    BOTTLES DRAWN AEROBIC AND ANAEROBIC Blood Culture  results may not be optimal due to an inadequate volume of blood received in culture bottles   Culture   Final    NO GROWTH < 24 HOURS Performed at [facility], [address], [place], [place] [address]    Report Status PENDING  Incomplete    Studies/Results: CT Head Wo Contrast Result Date: 04/14/2024 EXAM: CT HEAD WITHOUT 04/14/2024 11:10:55 AM TECHNIQUE: CT of the head was performed without the administration of intravenous contrast. Automated exposure control, iterative reconstruction, and/or weight based adjustment of the mA/kV was utilized to reduce the radiation dose to as low as reasonably achievable. COMPARISON: CT orbit reported separately today (04/14/2024) and prior head CT 08/17/2022. CLINICAL HISTORY: 73 year old male with head trauma, suspected intracranial venous injury, minor head trauma, periorbital cellulitis, facial swelling, and a fall on 04/06/2024. FINDINGS: BRAIN AND VENTRICLES: No acute intracranial hemorrhage. No mass effect or midline shift. No extra-axial fluid collection. No evidence of acute infarct. No hydrocephalus. Brain volume is stable and normal for age. Gray white differentiation is stable and normal for age. No suspicious intracranial vascular hyperdensity. Calcified atherosclerosis at the skull base. ORBITS: No acute abnormality. SINUSES AND MASTOIDS: Chronic paranasal sinus mucosal and mucoperiosteal thickening has not significantly changed. Tympanic cavities and mastoids are well aerated. SOFT TISSUES AND SKULL: Broad based and extensive scalp soft tissue swelling extending from the vertex inferiorly, anteriorly, and laterally on the left. This is contiguous with the abnormality described on orbit CT today separately. No scalp soft tissue gas identified. Calvarium appears stable and intact. No skull fracture. IMPRESSION: 1. Left scalp soft tissue abnormality, contiguous with Orbit CT findings (please see that report). 2. Normal for age non-contrast CT  appearance of the brain. Electronically signed by: Helayne Hurst MD 04/14/2024 11:26 AM EST RP Workstation: [id number]   CT Orbits W Contrast Result Date: 04/14/2024 EXAM: CT ORBITS WITH CONTRAST 04/14/2024 11:10:55 AM TECHNIQUE: CT of the orbits was performed with the administration of 75 mL of iohexol  (OMNIPAQUE ) 300 MG/ML solution intravenous contrast. Multiplanar reformatted images are provided for review. Automated exposure control, iterative reconstruction, and/or weight based adjustment of the mA/kV was utilized to reduce the radiation dose to as low as reasonably achievable. COMPARISON: CT head reported separately today. CLINICAL HISTORY: 73 year old male with periorbital cellulitis, facial swelling, and a fall on 04/06/2024. FINDINGS: ORBITS: Postoperative changes to both globes. Normal extraocular muscles. Normal optic nerve-sheath complexes. Extensive left orbit preseptal space involvement. No postseptal inflammation. Bilateral intraorbital soft tissues remain normal. No hematoma. No mass. Orbit walls appear intact. SOFT TISSUES: Broad based anterior scalp soft tissue swelling  and stranding which continues inferiorly around the left orbit. Extensive left orbit preseptal space involvement. And continued confluent soft tissue swelling and stranding continuing lateral to the left orbit and along the visible left anterior face. No soft tissue gas or fluid collection identified. Reactive-appearing left parotid space lymph nodes. Otherwise negative visible parotid, masticator, and parapharyngeal spaces. SINUSES AND MASTOIDS: Minor paranasal sinus mucosal thickening. No layering sinus fluid or hemorrhage. Tympanic cavities and mastoids are well aerated. Ordinary nasal cavity mucosal thickening. BONES: No acute abnormality. VASCULATURE: Major vascular structures in the visible upper neck and at the skull base are enhancing and appear patent. Calcified ICA siphon atherosclerosis. BRAIN: Negative visible brain  parenchyma. IMPRESSION: 1. Broad based scalp, preseptal left orbit, and left face soft tissue swelling and inflammation compatible with cellulitis. 2. No postseptal orbital involvement. No soft tissue gas or fluid collection. Electronically signed by: Helayne Hurst MD 04/14/2024 11:22 AM EST RP Workstation: [id number]    Assessment/Plan: QUENTON RECENDEZ is a 73 y.o. male being treated for MSSA diabetic foot osteomyelitis with cefazolin  now with left-sided facial rash for several days does not cross the midline.  He has eye swelling as well.  This is consistent with acute shingles.  His foot seems to be improving   Recommendations Facial shingles-cont  IV acyclovir .  I have ordered a swab PCR to confirm it although I think this is very likely the cause Ophtho consult did not feel any ocular involvment.  Monitor for worsening and/or bacterial superinfection.   Diabetic foot infection.  Please see Dr. Nettie  note.  He is to finish a course of cefazolin  December 25 but the plan was to shorten that as it is improving and changed to orals on Dec 21st.  We can likely finish his IV course here by Dec 21st and have him change over to oral cefadroxil  at discharge.   Thank you very much for the consult. Will follow with you.  Alm SHAUNNA Needle   04/15/2024, 3:11 PM

## 2024-04-15 NOTE — Consult Note (Signed)
 Daily Progress Note   Subjective  - * No surgery found *  Follow-up right foot ulcerations.  Recently admitted with facial shingles.  Also had a recent fall.  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  Multiple ulcerative sites.  Objective Vitals:   04/14/24 1130 04/14/24 1200 04/14/24 1944 04/15/24 0400  BP: (!) 159/88 (!) 148/94 120/80 121/82  Pulse: 79 79 86 80  Resp: 13 20 17 17   Temp:   99.1 F (37.3 C) 99.1 F (37.3 C)  TempSrc:   Oral Oral  SpO2: 100% 100% 99% 98%  Weight:      Height:        Physical Exam: Medial lateral ankle ulcers are stable.  Posterior heel ulcer is stable.  The distal incision sites have coapted and all sutures were removed today.  The lateral foot ulceration has a good granular tissue base with still a bit of depth to it about 1/2 to 1 cm of depth and the most central aspect.  Closing in nicely though.  Laboratory CBC    Component Value Date/Time   WBC 4.0 04/15/2024 0947   HGB 10.6 (L) 04/15/2024 0947   HCT 32.0 (L) 04/15/2024 0947   PLT 137 (L) 04/15/2024 0947    BMET    Component Value Date/Time   NA 135 04/15/2024 0947   K 4.2 04/15/2024 0947   CL 100 04/15/2024 0947   CO2 24 04/15/2024 0947   GLUCOSE 172 (H) 04/15/2024 0947   GLUCOSE 402 (H) 09/17/2012 1417   BUN 11 04/15/2024 0947   CREATININE 1.04 04/15/2024 0947   CALCIUM  8.5 (L) 04/15/2024 0947   GFRNONAA >60 04/15/2024 0947   GFRAA 58 (L) 06/02/2015 0656    Assessment/Planning: Status post debridement with chronic diabetic foot ulcerations  I would recommend reinstituting the wound VAC to the lateral wound site.  Continue with padded foam dressings to all other sites to offload these areas. I have recommended minimal weightbearing to the right foot only with transfer.  Otherwise should remain nonweightbearing. Patient has been seen by infectious disease.  They are transitioning to p.o. antibiotics upon discharge  from the hospital. Follow-up with podiatry in 2 weeks for reevaluation.  [name] A  04/15/2024, 1:16 PM

## 2024-04-15 NOTE — Plan of Care (Signed)
°  Problem: Coping: Goal: Ability to adjust to condition or change in health will improve Outcome: Progressing   Problem: Metabolic: Goal: Ability to maintain appropriate glucose levels will improve Outcome: Progressing   Problem: Skin Integrity: Goal: Risk for impaired skin integrity will decrease Outcome: Progressing   Problem: Education: Goal: Knowledge of General Education information will improve Description: Including pain rating scale, medication(s)/side effects and non-pharmacologic comfort measures Outcome: Progressing   Problem: Clinical Measurements: Goal: Cardiovascular complication will be avoided Outcome: Progressing   Problem: Elimination: Goal: Will not experience complications related to bowel motility Outcome: Progressing Goal: Will not experience complications related to urinary retention Outcome: Progressing   Problem: Pain Managment: Goal: General experience of comfort will improve and/or be controlled Outcome: Progressing   Problem: Safety: Goal: Ability to remain free from injury will improve Outcome: Progressing   Problem: Skin Integrity: Goal: Risk for impaired skin integrity will decrease Outcome: Progressing

## 2024-04-15 NOTE — Consult Note (Addendum)
 WOC Nurse Consult Note: Reason for Consult: Requested to apply a NPWT to right foot. Wound type: surgical. History of osteomyelitis. Wounds on malleolus and heel. Pressure Injury POA: NA (chronic diabetic ulcerations) Measurement: lateral 4 cm x 1 cm x 0.5 cm Wound bed: 80% red, 20% yellow. Drainage (amount, consistency, odor) Minimum amount, no odor, serous. Periwound: intact, peeling skin. Dressing procedure/placement/frequency: Removed old NPWT dressing Cleansed wound with normal saline Filled wound with 1 piece of black foam  Sealed NPWT dressing at HG Patient tolerated procedure well  WOC nurse will continue to provide NPWT dressing changed due to the complexity of the dressing change.  Changes TUE/FRI.  Addendum: Right foot vascular ulcers - Malleolus 1 cm x 1 cm, 100% yellow slough, small amount of drainage. Updated order to xeroform daily and foam dressing.  R heel 3 cm x 3 cm, 100% dry eschar. No drainage. Keep with betaine and foam dressing.  Vascular team is following.   WOC team will follow FRI. Please reconsult if further assistance is needed. Thank-you,  Lela Holm MSN, RN, CWCN, CNS.  (Phone [phone])

## 2024-04-15 NOTE — TOC Progression Note (Signed)
 Transition of Care [facility]) - Progression Note    Patient Details  Name: Andrew Harding MRN: [id number] Date of Birth: January 14, 1951  Transition of Care [facility]) CM/SW Contact  Nathanael CHRISTELLA Ring, RN Phone Number: 04/15/2024, 1:39 PM  Clinical Narrative:    CM spoke with Dr Ashley about discharge planning, he reports that infectious disease will be discontinuing IV and patient can dc on oral antibiotics.   He has 2 accepting home health agencies.  He is out of SNF days he has used all 100 days.  He is being treated for shingles.   CM reached out to [place] with [facility] in case Dr. Ashley wants to continue wound vac therapy at home.  Asked Dr, Franchot to place order for PT and OT to work with patient on wheelchair mobility and safe transfers with being non weight bearing on right foot.    Expected Discharge Plan: Home w Home Health Services Barriers to Discharge: Continued Medical Work up               Expected Discharge Plan and Services   Discharge Planning Services: CM Consult Post Acute Care Choice: Home Health Living arrangements for the past 2 months: Single Family Home                           HH Arranged: RN, PT, OT, Nurse's Aide, IV Antibiotics           Social Drivers of Health (SDOH) Interventions SDOH Screenings   Food Insecurity: Patient Declined (04/14/2024)  Housing: Patient Declined (04/14/2024)  Transportation Needs: Patient Declined (04/14/2024)  Utilities: Patient Declined (04/14/2024)  Depression (PHQ2-9): Low Risk (05/03/2023)  Financial Resource Strain: Low Risk  (02/22/2024)   Received from [facility]  Social Connections: Patient Declined (04/14/2024)  Tobacco Use: Medium Risk (04/14/2024)    Readmission Risk Interventions    04/14/2024    2:51 PM 03/14/2024    2:43 PM 01/14/2024   10:52 AM  Readmission Risk Prevention Plan  Transportation Screening Complete Complete Complete  PCP or Specialist Appt within 3-5 Days Complete Complete  Complete  HRI or Home Care Consult Complete  Complete  Social Work Consult for Recovery Care Planning/Counseling Complete Complete Complete  Palliative Care Screening Not Applicable Not Applicable Not Applicable  Medication Review Oceanographer) Complete Complete Complete

## 2024-04-15 NOTE — TOC Progression Note (Signed)
 Transition of Care [facility]) - Progression Note    Patient Details  Name: Andrew Harding MRN: [id number] Date of Birth: 1950-09-24  [facility] CM/SW Contact  Nathanael CHRISTELLA Ring, RN Phone Number: 04/15/2024, 4:17 PM  Clinical Narrative:    CM reached out to [facility] to see if they could tell me how many SNF days he had left this year and they report back that he has 4 days left.  Per his plan even though Medicare it is [place] through an employer so it is based off of the calendar year.  As of January his 100 days will restart.  This information was relayed to patient.   Informed him that [facility] and [facility] offered home health services, he does not have a preference, he is okay with [facility].   TOC will follow up tomorrow.  [facility] vac has been returned to [facility] by one of patient's friends, he says he dropped it off at the front desk.    Expected Discharge Plan: Home w Home Health Services Barriers to Discharge: Continued Medical Work up               Expected Discharge Plan and Services   Discharge Planning Services: CM Consult Post Acute Care Choice: Home Health Living arrangements for the past 2 months: Single Family Home                           HH Arranged: RN, PT, OT, Nurse's Aide, IV Antibiotics           Social Drivers of Health (SDOH) Interventions SDOH Screenings   Food Insecurity: Patient Declined (04/14/2024)  Housing: Patient Declined (04/14/2024)  Transportation Needs: Patient Declined (04/14/2024)  Utilities: Patient Declined (04/14/2024)  Depression (PHQ2-9): Low Risk (05/03/2023)  Financial Resource Strain: Low Risk  (02/22/2024)   Received from [facility]  Social Connections: Patient Declined (04/14/2024)  Tobacco Use: Medium Risk (04/14/2024)    Readmission Risk Interventions    04/14/2024    2:51 PM 03/14/2024    2:43 PM 01/14/2024   10:52 AM  Readmission Risk Prevention Plan  Transportation Screening Complete  Complete Complete  PCP or Specialist Appt within 3-5 Days Complete Complete Complete  HRI or Home Care Consult Complete  Complete  Social Work Consult for Recovery Care Planning/Counseling Complete Complete Complete  Palliative Care Screening Not Applicable Not Applicable Not Applicable  Medication Review Oceanographer) Complete Complete Complete

## 2024-04-16 DIAGNOSIS — E119 Type 2 diabetes mellitus without complications: Secondary | ICD-10-CM

## 2024-04-16 DIAGNOSIS — Z794 Long term (current) use of insulin: Secondary | ICD-10-CM

## 2024-04-16 DIAGNOSIS — L03211 Cellulitis of face: Secondary | ICD-10-CM | POA: Diagnosis not present

## 2024-04-16 DIAGNOSIS — L089 Local infection of the skin and subcutaneous tissue, unspecified: Secondary | ICD-10-CM | POA: Diagnosis not present

## 2024-04-16 LAB — GLUCOSE, CAPILLARY
Glucose-Capillary: 99 mg/dL (ref 70–99)
Glucose-Capillary: 154 mg/dL — ABNORMAL HIGH (ref 70–99)
Glucose-Capillary: 168 mg/dL — ABNORMAL HIGH (ref 70–99)
Glucose-Capillary: 111 mg/dL — ABNORMAL HIGH (ref 70–99)

## 2024-04-16 LAB — CBC
HCT: 31.4 % — ABNORMAL LOW (ref 39.0–52.0)
Hemoglobin: 10.1 g/dL — ABNORMAL LOW (ref 13.0–17.0)
MCH: 28.3 pg (ref 26.0–34.0)
MCHC: 32.2 g/dL (ref 30.0–36.0)
MCV: 88 fL (ref 80.0–100.0)
Platelets: 135 K/uL — ABNORMAL LOW (ref 150–400)
RBC: 3.57 MIL/uL — ABNORMAL LOW (ref 4.22–5.81)
RDW: 14.7 % (ref 11.5–15.5)
WBC: 3.6 K/uL — ABNORMAL LOW (ref 4.0–10.5)
nRBC: 0 % (ref 0.0–0.2)

## 2024-04-16 LAB — BASIC METABOLIC PANEL WITH GFR
Anion gap: 8 (ref 5–15)
BUN: 9 mg/dL (ref 8–23)
CO2: 26 mmol/L (ref 22–32)
Calcium: 8.9 mg/dL (ref 8.9–10.3)
Chloride: 103 mmol/L (ref 98–111)
Creatinine, Ser: 1.08 mg/dL (ref 0.61–1.24)
GFR, Estimated: >60 mL/min (ref >60)
Glucose, Bld: 111 mg/dL — ABNORMAL HIGH (ref 70–99)
Potassium: 3.8 mmol/L (ref 3.5–5.1)
Sodium: 137 mmol/L (ref 135–145)

## 2024-04-16 LAB — FERRITIN: Ferritin: 67 ng/mL (ref 24–336)

## 2024-04-16 LAB — IRON AND TIBC
Iron: 34 ug/dL — ABNORMAL LOW (ref 45–182)
Saturation Ratios: 13 % — ABNORMAL LOW (ref 17.9–39.5)
TIBC: 259 ug/dL (ref 250–450)
UIBC: 225 ug/dL

## 2024-04-16 MED ORDER — VALACYCLOVIR HCL 500 MG PO TABS
1000.0000 mg | ORAL_TABLET | Freq: Three times a day (TID) | ORAL | Status: DC
  Administered 2024-04-17 – 2024-04-20 (×10): 1000 mg via ORAL
  Filled 2024-04-16 (×12): qty 2

## 2024-04-16 NOTE — Progress Notes (Signed)
 Occupational Therapy Evaluation Patient Details Name: Andrew Harding MRN: [id number] DOB: 04/27/1951 Today's Date: 04/16/2024   History of Present Illness   Pt is a 73 y.o. male admitted with L facial cellulitis. PMH significant for IIDM, peripheral neuropathy, CAD, s/p CABG, HTN, PAF on Eliquis , s/p R TMA with chronic residual foot ulcer infection and osteomyelitis on wound VAC and chronic antibiotic therapy via PICC line, L BKA with prosthesis.     Clinical Impressions Pt admitted with above. Pt is pleasant, motivated and eager to be as independent as possible. Pt recently at [facility] following medical issues including L BKA, is hopeful to discharge home alone. Pt demonstrates good awareness of minimal weightbearing on RLE given R TMA with wound vac present, is able to confidently don R post-op shoe + L prosthesis while sitting EOB at supervision level. Pt demonstrates good problem-solving for ADL performance in the home environment, CGA for safety for STS transfers using RW (once upright, pt able to maintain NWB status but light weightbearing through heel to transfer upright). Pt will continue to benefit from skilled OT services to focus on functional mobility + transfers while hospitalized. Pt would benefit from skilled OT services to address noted impairments and functional limitations (see below for any additional details) in order to maximize safety and independence while minimizing falls risk and caregiver burden. Anticipate the need for follow up OT services upon acute hospital DC.      If plan is discharge home, recommend the following:   A little help with walking and/or transfers;A little help with bathing/dressing/bathroom;Assist for transportation     Functional Status Assessment   Patient has had a recent decline in their functional status and demonstrates the ability to make significant improvements in function in a reasonable and predictable amount of time.     Equipment  Recommendations   None recommended by OT (has all DME)      Precautions/Restrictions   Precautions Precautions: Fall Precaution/Restrictions Comments: L BKA, R TMA with wound vac (post-op shoe in room, prosthetic in room) Restrictions Weight Bearing Restrictions Per Provider Order: Yes RLE Weight Bearing Per Provider Order: Non weight bearing Other Position/Activity Restrictions: Per podiatry note minimal weightbearing to the right foot only with transfer. Otherwise should remain NWB. Has L prosthesis in room.     Mobility Bed Mobility Overal bed mobility: Modified Independent                  Transfers Overall transfer level: Needs assistance Equipment used: Rolling walker (2 wheels) Transfers: Sit to/from Stand, Bed to chair/wheelchair/BSC Sit to Stand: Contact guard assist           General transfer comment: pt able to perform STS with RW, CGA and L prosthesis donned. minimal WB through R heel to assist with standing. no difficulties with laterally scooting      Balance Overall balance assessment: Modified Independent, Needs assistance   Sitting balance-Leahy Scale: Normal       Standing balance-Leahy Scale: Good                             ADL either performed or assessed with clinical judgement   ADL Overall ADL's : Needs assistance/impaired                     Lower Body Dressing: Sit to/from stand;Supervision/safety Lower Body Dressing Details (indicate cue type and reason): able to don R post op shoe  and L prosthesis while sitting EOB. Toilet Transfer: Agricultural consultant (2 wheels);BSC/3in1 Statistician Details (indicate cue type and reason): clinical judgement (pt able to transfer only with WB on R foot), can step hop with L prosthesis donned Toileting- Clothing Manipulation and Hygiene: Supervision/safety;Sit to/from stand       Functional mobility during ADLs: Supervision/safety       Vision   Additional Comments:  facial swelling (can see out of R eye, L eye swollen), wears glasses at baseline            Pertinent Vitals/Pain Pain Assessment Pain Assessment: Faces Faces Pain Scale: Hurts a little bit Pain Descriptors / Indicators: Aching Pain Intervention(s): Limited activity within patient's tolerance, Repositioned     Extremity/Trunk Assessment Upper Extremity Assessment Upper Extremity Assessment: Overall WFL for tasks assessed;Right hand dominant   Lower Extremity Assessment Lower Extremity Assessment: Overall WFL for tasks assessed (L (BKA) LE WNL, R functional with NWBing tolerance)   Cervical / Trunk Assessment Cervical / Trunk Assessment: Normal   Communication Communication Communication: No apparent difficulties   Cognition Arousal: Alert Behavior During Therapy: WFL for tasks assessed/performed Cognition: No apparent impairments             OT - Cognition Comments: A&Ox4, very motivated, driven and determined.                 Following commands: Intact       Cueing  General Comments   Cueing Techniques: Verbal cues  Pt was able to move well, understands ongoing NWBing will change his mobility but otherwise he is near his baseline.   Exercises     Shoulder Instructions      Home Living Family/patient expects to be discharged to:: Private residence Living Arrangements: Alone Available Help at Discharge: Family;Friend(s);Available PRN/intermittently Type of Home: House Home Access: Stairs to enter [employer] of Steps: 3 Entrance Stairs-Rails: Right       Bathroom Shower/Tub: Chief Strategy Officer: Handicapped height     Home Equipment: Agricultural Consultant (2 wheels);Cane - single point;Wheelchair - manual;Grab bars - tub/shower;Tub bench          Prior Functioning/Environment Prior Level of Function : Independent/Modified Independent             Mobility Comments: has L LE prosthesis - does use walker to get  around but has w/c and has been using it exclusively lately ADLs Comments: mod indpendent. prior to L BKA/recent medical issues, pt working with [employer], driving.    OT Problem List: Decreased range of motion;Decreased knowledge of use of DME or AE;Decreased activity tolerance;Impaired balance (sitting and/or standing);Decreased knowledge of precautions   OT Treatment/Interventions: Self-care/ADL training;Energy conservation;DME and/or AE instruction;Therapeutic activities;Patient/family education;Balance training      OT Goals(Current goals can be found in the care plan section)   Acute Rehab OT Goals OT Goal Formulation: With patient Time For Goal Achievement: 04/30/24 Potential to Achieve Goals: Good   OT Frequency:  Min 2X/week       AM-PAC OT 6 Clicks Daily Activity     Outcome Measure Help from another person eating meals?: None Help from another person taking care of personal grooming?: None Help from another person toileting, which includes using toliet, bedpan, or urinal?: A Little Help from another person bathing (including washing, rinsing, drying)?: A Little Help from another person to put on and taking off regular upper body clothing?: None Help from another person to put on and taking off  regular lower body clothing?: A Little 6 Click Score: 21   End of Session Equipment Utilized During Treatment: Rolling walker (2 wheels) Nurse Communication: Mobility status  Activity Tolerance: Patient tolerated treatment well;No increased pain Patient left: in bed;with call bell/phone within reach;with bed alarm set  OT Visit Diagnosis: Other abnormalities of gait and mobility (R26.89)                Time: 8547-8479 OT Time Calculation (min): 28 min Charges:  OT General Charges $OT Visit: 1 Visit OT Evaluation $OT Eval Low Complexity: 1 Low  Teletha Petrea L. Waqas Bruhl, OTR/L  04/16/2024, 3:50 PM

## 2024-04-16 NOTE — Plan of Care (Signed)

## 2024-04-16 NOTE — Evaluation (Signed)
 Physical Therapy Evaluation Patient Details Name: Andrew Harding MRN: [id number] DOB: Aug 30, 1950 Today's Date: 04/16/2024  History of Present Illness  Elzy Tomasello is a 73yoM who came to [facility] on 03/11/24 for vascular surgery to address atherosclerotic occlusive disease bilateral lower extremities with ulceration and nonhealing wounds of the right lower extremity. Ultimately returns now with shingles/facial cellulitis. Per podiatry patient is to be nonweightbearing on his right lower extremity.  Clinical Impression  Pt pleasant and willing to do all that was asked of him. He showed good confidence with bed mobility, donning prosthetic, easily transitioning bed to recliner and was able to do some hop/walking with the walker.  He had enough UE strength and control to do some ambulation while maintaining NWBing but did self select (with heel post-op shoe on) to do some light WBing though the heel - encouraged to avoid as much WBing as possible - pt in agreement and understanding. Pt will benefit from continued PT to address functional limitations and to insure appropriate and safe discharge.       If plan is discharge home, recommend the following: Assist for transportation;Assistance with cooking/housework;Help with stairs or ramp for entrance   Can travel by private vehicle        Equipment Recommendations None recommended by PT  Recommendations for Other Services       Functional Status Assessment Patient has had a recent decline in their functional status and demonstrates the ability to make significant improvements in function in a reasonable and predictable amount of time.     Precautions / Restrictions Precautions Precautions: Fall Precaution/Restrictions Comments: L BKA, R TMA with wound vac Restrictions Weight Bearing Restrictions Per Provider Order: Yes RLE Weight Bearing Per Provider Order: Non weight bearing Other Position/Activity Restrictions: Per podiatry note minimal  weightbearing to the right foot only with transfer. Otherwise should remain NWB.      Mobility  Bed Mobility Overal bed mobility: Modified Independent             General bed mobility comments: easily transitions supine to sit w/o assist    Transfers Overall transfer level: Needs assistance Equipment used: Rolling walker (2 wheels) Transfers: Sit to/from Stand, Bed to chair/wheelchair/BSC Sit to Stand: Contact guard assist          Lateral/Scoot Transfers: Supervision (easily scoots laterally bed to reclner (drop arm) w/o assist) General transfer comment: Pt able to confidently don L prosthetic sitting EOB w/o issue, light assist to don post-op shoe.  He displays good UE strength to power up but did use some heel WBing on L to assist with transition to standing.  Pt able to maintain formal NWBing once up in the walker.  Pt able to stand multiple times from multiple surfaces.    Ambulation/Gait Ambulation/Gait assistance: Contact guard assist Gait Distance (Feet): 10 Feet Assistive device: Rolling walker (2 wheels)         General Gait Details: Pt self selects light WBing though heel post-op but once cued and educated on the importance for heeling did show ability to fully maintain NWBing with walker while showing good control using UEs to lift and controlledly advance L LE  Stairs            Wheelchair Mobility     Tilt Bed    Modified Rankin (Stroke Patients Only)       Balance Overall balance assessment: Modified Independent, Needs assistance   Sitting balance-Leahy Scale: Normal Sitting balance - Comments: confident and safety with seated  sitting     Standing balance-Leahy Scale: Good Standing balance comment: confident with static standing and walker use, did tend to want to put some weight through R heel but able to maintain NWBing once cued/comprehends importance                             Pertinent Vitals/Pain Pain  Assessment Pain Assessment: Faces Pain Location: has a little bit of expected pain in R foot but not limiting    Home Living Family/patient expects to be discharged to:: Private residence Living Arrangements: Alone Available Help at Discharge: Family;Friend(s);Available PRN/intermittently Type of Home: House Home Access: Stairs to enter Entrance Stairs-Rails: Right Entrance Stairs-Number of Steps: 3     Home Equipment: Agricultural Consultant (2 wheels);Cane - single point;Wheelchair - manual;Grab bars - tub/shower;Tub bench      Prior Function Prior Level of Function : Independent/Modified Independent             Mobility Comments: has L LE prosthesis - does use walker to get around but has w/c an has been using it exclusively lately       Extremity/Trunk Assessment   Upper Extremity Assessment Upper Extremity Assessment: Overall WFL for tasks assessed    Lower Extremity Assessment Lower Extremity Assessment: Overall WFL for tasks assessed (L (BKA) LE WNL, R functional with NWBing tolerance)       Communication   Communication Communication: No apparent difficulties    Cognition Arousal: Alert Behavior During Therapy: WFL for tasks assessed/performed   PT - Cognitive impairments: No apparent impairments                         Following commands: Intact       Cueing Cueing Techniques: Verbal cues     General Comments General comments (skin integrity, edema, etc.): Pt was able to move well, understands ongoing NWBing will change his mobility but otherwise he is near his baseline.    Exercises     Assessment/Plan    PT Assessment Patient needs continued PT services  PT Problem List Decreased strength;Decreased range of motion;Decreased activity tolerance;Decreased balance;Decreased mobility;Decreased knowledge of use of DME;Decreased safety awareness       PT Treatment Interventions DME instruction;Gait training;Functional mobility  training;Therapeutic activities;Therapeutic exercise;Balance training;Patient/family education    PT Goals (Current goals can be found in the Care Plan section)  Acute Rehab PT Goals Patient Stated Goal: go home PT Goal Formulation: With patient Time For Goal Achievement: 04/29/24 Potential to Achieve Goals: Good    Frequency Min 2X/week     Co-evaluation               AM-PAC PT 6 Clicks Mobility  Outcome Measure Help needed turning from your back to your side while in a flat bed without using bedrails?: None Help needed moving from lying on your back to sitting on the side of a flat bed without using bedrails?: None Help needed moving to and from a bed to a chair (including a wheelchair)?: A Little Help needed standing up from a chair using your arms (e.g., wheelchair or bedside chair)?: A Little Help needed to walk in hospital room?: A Little Help needed climbing 3-5 steps with a railing? : A Little 6 Click Score: 20    End of Session   Activity Tolerance: Patient tolerated treatment well Patient left: with call bell/phone within reach;with chair alarm set Nurse Communication: Mobility  status;Weight bearing status PT Visit Diagnosis: Muscle weakness (generalized) (M62.81);Difficulty in walking, not elsewhere classified (R26.2)    Time: 9049-8974 PT Time Calculation (min) (ACUTE ONLY): 35 min   Charges:   PT Evaluation $PT Eval Low Complexity: 1 Low PT Treatments $Therapeutic Activity: 8-22 mins PT General Charges $$ ACUTE PT VISIT: 1 Visit         Carmin JONELLE Deed, DPT 04/16/2024, 3:04 PM

## 2024-04-16 NOTE — Progress Notes (Signed)
 [facility]  - [place] [facility]   PATIENT NAME: Andrew Harding    MR#:  [id number]  DATE OF BIRTH:  [birth year]  SUBJECTIVE:  no family at bedside. Patient overall improving. Continues to have some left forehead pain due to shingles. No fever. Tolerating PO diet. Tolerating PT. Dietary recommend continue wound VAC.   VITALS:  Blood pressure (!) 146/92, pulse 70, temperature 97.7 F (36.5 C), temperature source Oral, resp. rate 17, height 6' 3 (1.905 m), weight 86.2 kg, SpO2 100%.  PHYSICAL EXAMINATION:   GENERAL:  73 y.o.-year-old patient with no acute distress.  LUNGS: Normal breath sounds bilaterally, no wheezing CARDIOVASCULAR: S1, S2 normal. No murmur   ABDOMEN: Soft, nontender, nondistended. Bowel sounds present.  EXTREMITIES:Wound VAC right foot NEUROLOGIC: nonfocal  patient is alert and awake SKIN: shingles lesions scabbing over the left eye forehead, redness improving  LABORATORY PANEL:  CBC Recent Labs  Lab 04/16/24 0606  WBC 3.6*  HGB 10.1*  HCT 31.4*  PLT 135*    Chemistries  Recent Labs  Lab 04/14/24 1003 04/15/24 0947 04/16/24 0606  NA 140   < > 137  K 4.4   < > 3.8  CL 104   < > 103  CO2 25   < > 26  GLUCOSE 117*   < > 111*  BUN 12   < > 9  CREATININE 1.27*   < > 1.08  CALCIUM  9.8   < > 8.9  AST 27  --   --   ALT <5  --   --   ALKPHOS 83  --   --   BILITOT 0.4  --   --    < > = values in this interval not displayed.   Assessment and Plan  Andrew Harding is a 73 y.o. male with medical history significant of IIDM, peripheral neuropathy, CAD, status post CABG, HTN, PAF on Eliquis , status post right TMA with chronic residual foot ulcer infection and osteomyelitis on wound VAC and chronic antibiotic therapy via PICC line, presented with worsening of left facial infection.   Left sided herpes zoster ophtlamicus - Case was discussed with on-call infectious disease who recommended acyclovir  IV, ophthalmology consulted  - Continue  valtrex  1g PO TID for 10d Erythromycin   ointment to eyelids for comfort. F/u with [facility] in 1 week.  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   --Continue ancef  till 12/21 inhouse per ID Dr. Larina recommendation and then change to oral antibiotic --Will need wound vac replaced, WOC consulted.  --seen by Podiatry Dr. Ashley and recommends continue wound VAC for now    Chronic afib  --Rate controlled  --Continue eliquis  and metoprolol     T2DM insulin  dependent  --A1c 7.1.  On Lantus  24 units at home, metformin  twice daily, and dulaglutide . continue lantus  24u, metformin  + SSI.    Normocytic anemia  --Appears to be at baseline hgb.  Will check iron  panel     PVD s/p angioplasty and stent placement to the right popliteal and anterior tibial arteries  --Continue Asprin, statin   Procedures: Family communication :none Consults : podiatry, infectious disease CODE STATUS: full DVT Prophylaxis : eliquis  Level of care: Med-Surg Status is: Inpatient Remains inpatient appropriate because: per ID recommendation patient will stay till 12/21 to complete IV antibiotic course and then transition to oral antibiotic as outpatient  TOTAL TIME TAKING CARE OF THIS PATIENT: 35 minutes.  >50% time spent on counselling and coordination of care  Note: This dictation was prepared with Dragon dictation along with smaller phrase technology. Any transcriptional errors that result from this process are unintentional.  Leita Blanch M.D    [facility]   CC: Primary care physician; Fernande Ophelia [name], MD

## 2024-04-17 ENCOUNTER — Ambulatory Visit (INDEPENDENT_AMBULATORY_CARE_PROVIDER_SITE_OTHER): Admitting: Vascular Surgery

## 2024-04-17 DIAGNOSIS — L089 Local infection of the skin and subcutaneous tissue, unspecified: Secondary | ICD-10-CM | POA: Diagnosis not present

## 2024-04-17 DIAGNOSIS — Z794 Long term (current) use of insulin: Secondary | ICD-10-CM | POA: Diagnosis not present

## 2024-04-17 DIAGNOSIS — E119 Type 2 diabetes mellitus without complications: Secondary | ICD-10-CM | POA: Diagnosis not present

## 2024-04-17 DIAGNOSIS — B029 Zoster without complications: Secondary | ICD-10-CM | POA: Diagnosis not present

## 2024-04-17 DIAGNOSIS — L03211 Cellulitis of face: Secondary | ICD-10-CM | POA: Diagnosis not present

## 2024-04-17 LAB — GLUCOSE, CAPILLARY
Glucose-Capillary: 114 mg/dL — ABNORMAL HIGH (ref 70–99)
Glucose-Capillary: 131 mg/dL — ABNORMAL HIGH (ref 70–99)
Glucose-Capillary: 129 mg/dL — ABNORMAL HIGH (ref 70–99)
Glucose-Capillary: 104 mg/dL — ABNORMAL HIGH (ref 70–99)

## 2024-04-17 LAB — BASIC METABOLIC PANEL WITH GFR
Anion gap: 9 (ref 5–15)
BUN: 9 mg/dL (ref 8–23)
CO2: 27 mmol/L (ref 22–32)
Calcium: 8.9 mg/dL (ref 8.9–10.3)
Chloride: 104 mmol/L (ref 98–111)
Creatinine, Ser: 1.07 mg/dL (ref 0.61–1.24)
GFR, Estimated: >60 mL/min (ref >60)
Glucose, Bld: 97 mg/dL (ref 70–99)
Potassium: 3.9 mmol/L (ref 3.5–5.1)
Sodium: 140 mmol/L (ref 135–145)

## 2024-04-17 LAB — CBC
HCT: 33 % — ABNORMAL LOW (ref 39.0–52.0)
Hemoglobin: 10.4 g/dL — ABNORMAL LOW (ref 13.0–17.0)
MCH: 27.6 pg (ref 26.0–34.0)
MCHC: 31.5 g/dL (ref 30.0–36.0)
MCV: 87.5 fL (ref 80.0–100.0)
Platelets: 159 K/uL (ref 150–400)
RBC: 3.77 MIL/uL — ABNORMAL LOW (ref 4.22–5.81)
RDW: 14.7 % (ref 11.5–15.5)
WBC: 5 K/uL (ref 4.0–10.5)
nRBC: 0 % (ref 0.0–0.2)

## 2024-04-17 NOTE — Progress Notes (Signed)
 INFECTIOUS DISEASE PROGRESS NOTE Date of Admission:  04/14/2024     ID: Andrew Harding is a 73 y.o. male with  shingles, DFI  Principal Problem:   Facial cellulitis   Subjective: No fevers, wbc nml. Facial pain and swelling improving  ROS  Eleven systems are reviewed and negative except per hpi  Medications:  Antibiotics Given (last 72 hours)     Date/Time Action Medication Dose Rate   04/14/24 1427 New Bag/Given   ceFAZolin  (ANCEF ) IVPB 2g/100 mL premix 2 g 200 mL/hr   04/14/24 1816 New Bag/Given   acyclovir  (ZOVIRAX ) 860 mg in dextrose  5 % 250 mL IVPB 860 mg 267.2 mL/hr   04/14/24 2148 New Bag/Given   ceFAZolin  (ANCEF ) IVPB 2g/100 mL premix 2 g 200 mL/hr   04/15/24 9786 New Bag/Given   acyclovir  (ZOVIRAX ) 860 mg in dextrose  5 % 250 mL IVPB 860 mg 267.2 mL/hr   04/15/24 9385 New Bag/Given   ceFAZolin  (ANCEF ) IVPB 2g/100 mL premix 2 g 200 mL/hr   04/15/24 1012 New Bag/Given   acyclovir  (ZOVIRAX ) 860 mg in dextrose  5 % 250 mL IVPB 860 mg 267.2 mL/hr   04/15/24 1443 New Bag/Given   ceFAZolin  (ANCEF ) IVPB 2g/100 mL premix 2 g 200 mL/hr   04/15/24 1800 New Bag/Given   acyclovir  (ZOVIRAX ) 860 mg in dextrose  5 % 250 mL IVPB 860 mg 267.2 mL/hr   04/15/24 2128 New Bag/Given   ceFAZolin  (ANCEF ) IVPB 2g/100 mL premix 2 g 200 mL/hr   04/16/24 0212 New Bag/Given   acyclovir  (ZOVIRAX ) 860 mg in dextrose  5 % 250 mL IVPB 860 mg 267.2 mL/hr   04/16/24 0545 New Bag/Given   ceFAZolin  (ANCEF ) IVPB 2g/100 mL premix 2 g 200 mL/hr   04/16/24 9077 New Bag/Given   acyclovir  (ZOVIRAX ) 860 mg in dextrose  5 % 250 mL IVPB 860 mg 267.2 mL/hr   04/16/24 1356 New Bag/Given   ceFAZolin  (ANCEF ) IVPB 2g/100 mL premix 2 g 200 mL/hr   04/16/24 1725 New Bag/Given   acyclovir  (ZOVIRAX ) 860 mg in dextrose  5 % 250 mL IVPB 860 mg 267.2 mL/hr   04/16/24 2257 New Bag/Given   ceFAZolin  (ANCEF ) IVPB 2g/100 mL premix 2 g 200 mL/hr   04/17/24 0232 New Bag/Given   acyclovir  (ZOVIRAX ) 860 mg in dextrose  5 % 250 mL IVPB  860 mg 267.2 mL/hr   04/17/24 0615 New Bag/Given   ceFAZolin  (ANCEF ) IVPB 2g/100 mL premix 2 g 200 mL/hr   04/17/24 0944 Given   valACYclovir  (VALTREX ) tablet 1,000 mg 1,000 mg        apixaban   5 mg Oral BID   aspirin  EC  81 mg Oral Daily   Chlorhexidine  Gluconate Cloth  6 each Topical Daily   erythromycin    Left Eye Q8H   gabapentin   300 mg Oral TID   insulin  aspart  0-9 Units Subcutaneous TID WC   insulin  glargine  24 Units Subcutaneous Q2200   metFORMIN   1,000 mg Oral BID WC   metoprolol  tartrate  12.5 mg Oral BID   pravastatin   80 mg Oral QHS   sodium chloride  flush  10-40 mL Intracatheter Q12H   traZODone   50 mg Oral QHS   valACYclovir   1,000 mg Oral TID    Objective: Vital signs in last 24 hours: Temp:  [97.5 F (36.4 C)-99 F (37.2 C)] 99 F (37.2 C) (12/18 0940) Pulse Rate:  [59-75] 75 (12/18 0940) Resp:  [16] 16 (12/18 0940) BP: (116-132)/(65-83) 120/70 (12/18 0940) SpO2:  [98 %-  100 %] 98 % (12/18 0940) Constitutional: He is oriented to person, place, and time. He appears well-developed and well-nourished. No distress.  HENT: facial scabs and erythematous lesions not crossing midline> L eye edema and erythema. Less swelling L eye Mouth/Throat: Oropharynx is clear and moist. No oropharyngeal exudate.  Cardiovascular: Normal rate, regular rhythm and normal heart sounds.  PICC Line UE wnl Pulmonary/Chest: Effort normal and breath sounds normal. No respiratory distress. He has no wheezes.  Abdominal: Soft. Bowel sounds are normal. He exhibits no distension. There is no tenderness.  Lymphadenopathy:  He has no cervical adenopathy.  Neurological: He is alert and oriented to person, place, and time.  Skin: facial rash as above and in photo. Foot wounds as per photo- relatively clean Psychiatric: He has a normal mood and affect. His behavior is normal.   Lab Results Recent Labs    04/16/24 0606 04/17/24 0612  WBC 3.6* 5.0  HGB 10.1* 10.4*  HCT 31.4* 33.0*  NA  137 140  K 3.8 3.9  CL 103 104  CO2 26 27  BUN 9 9  CREATININE 1.08 1.07    Microbiology: Results for orders placed or performed during the hospital encounter of 04/14/24  Culture, blood (routine x 2)     Status: None (Preliminary result)   Collection Time: 04/14/24 10:03 AM   Specimen: BLOOD  Result Value Ref Range Status   Specimen Description BLOOD BLOOD LEFT FOREARM  Final   Special Requests   Final    BOTTLES DRAWN AEROBIC AND ANAEROBIC Blood Culture adequate volume   Culture   Final    NO GROWTH 3 DAYS Performed at [facility], [address] [place], [place] [address]    Report Status PENDING  Incomplete  Culture, blood (routine x 2)     Status: None (Preliminary result)   Collection Time: 04/14/24 10:03 AM   Specimen: BLOOD  Result Value Ref Range Status   Specimen Description BLOOD BLOOD RIGHT FOREARM  Final   Special Requests   Final    BOTTLES DRAWN AEROBIC AND ANAEROBIC Blood Culture results may not be optimal due to an inadequate volume of blood received in culture bottles   Culture   Final    NO GROWTH 3 DAYS Performed at [facility], [address] [place], [place] [address]    Report Status PENDING  Incomplete  Varicella-zoster by PCR     Status: Abnormal   Collection Time: 04/14/24  2:29 PM   Specimen: Face; Sterile Swab  Result Value Ref Range Status   Varicella-Zoster, PCR Positive (A) Negative Final    Comment: (NOTE) Varicella Zoster Virus DNA detected. This test was developed and its performance characteristics determined by [facility]. It has not been cleared or approved by the Food and Drug Administration. Performed At: [facility] [address] [place], [place] [id number] Jennette Shorter MD Ey:[phone]     Studies/Results: No results found.   Assessment/Plan: Andrew Harding is a 73 y.o. male being treated for MSSA diabetic foot osteomyelitis with cefazolin  now with left-sided facial rash for several days does  not cross the midline.  He has eye swelling as well.  This is consistent with acute shingles.  His foot seems to be improving   Recommendations Facial shingles-cont  valtrex  1000 mg tid for 10 days Ophtho consult did not feel any ocular involvment.  Monitor for worsening and/or bacterial superinfection.   Diabetic foot infection.  Please see Dr. Nettie  note.  He  is to finish a course of cefazolin  December 21. He can then be dced on 12/22 with oral cefadroxil  1 gram BID for 2 weeks further FU Dr Fayette as scheduled   ID will sign off. Please call with questions   Alm SHAUNNA Needle   04/17/2024, 1:39 PM

## 2024-04-17 NOTE — Plan of Care (Signed)

## 2024-04-17 NOTE — Progress Notes (Signed)
 [facility]  - [place] at [facility]   PATIENT NAME: Andrew Harding    MR#:  [id number]  DATE OF BIRTH:  [birth year]  SUBJECTIVE:  no family at bedside. Patient overall improving. Continues to have some left forehead pain due to shingles. No fever. Tolerating PO diet. Tolerating PT Podiatry  recommend continue wound VAC at discharge also (he has a box in his room).   VITALS:  Blood pressure 120/70, pulse 75, temperature 99 F (37.2 C), resp. rate 16, height 6' 3 (1.905 m), weight 86.2 kg, SpO2 98%.  PHYSICAL EXAMINATION:   GENERAL:  73 y.o.-year-old patient with no acute distress.  LUNGS: Normal breath sounds bilaterally, no wheezing CARDIOVASCULAR: S1, S2 normal. No murmur   ABDOMEN: Soft, nontender, nondistended. Bowel sounds present.  EXTREMITIES:Wound VAC right foot NEUROLOGIC: nonfocal  patient is alert and awake SKIN: shingles lesions scabbing over the left eye forehead, redness improving  LABORATORY PANEL:  CBC Recent Labs  Lab 04/17/24 0612  WBC 5.0  HGB 10.4*  HCT 33.0*  PLT 159    Chemistries  Recent Labs  Lab 04/14/24 1003 04/15/24 0947 04/17/24 0612  NA 140   < > 140  K 4.4   < > 3.9  CL 104   < > 104  CO2 25   < > 27  GLUCOSE 117*   < > 97  BUN 12   < > 9  CREATININE 1.27*   < > 1.07  CALCIUM  9.8   < > 8.9  AST 27  --   --   ALT <5  --   --   ALKPHOS 83  --   --   BILITOT 0.4  --   --    < > = values in this interval not displayed.   Assessment and Plan  Andrew Harding is a 73 y.o. male with medical history significant of IIDM, peripheral neuropathy, CAD, status post CABG, HTN, PAF on Eliquis , status post right TMA with chronic residual foot ulcer infection and osteomyelitis on wound VAC and chronic antibiotic therapy via PICC line, presented with worsening of left facial infection.   Left sided herpes zoster ophtlamicus - Case was discussed with on-call infectious disease who recommended acyclovir  IV, ophthalmology consulted  -  Continue valtrex  1g PO TID for 10d Erythromycin   ointment to eyelids for comfort. F/u with [facility] in 1 week.  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   --Continue ancef  till 12/21 inhouse per ID Dr. Larina recommendation and then change to oral antibiotic --per ID--  Please see Dr. Nettie  note.  He is to finish a course of cefazolin  December 21. He can then be dced on 12/22 with oral cefadroxil  1 gram BID for 2 weeks further FU Dr Fayette as scheduled  --seen by Podiatry Dr. Ashley and recommends continue wound VAC for now at home also    Chronic afib  --Rate controlled  --Continue eliquis  and metoprolol     T2DM insulin  dependent  --A1c 7.1.  On Lantus  24 units at home, metformin  twice daily, and dulaglutide . continue lantus  24u, metformin  + SSI.    Normocytic anemia  --Appears to be at baseline hgb.     PVD s/p angioplasty and stent placement to the right popliteal and anterior tibial arteries  --Continue Asprin, statin   Procedures: Family communication :none Consults : podiatry, infectious  disease CODE STATUS: full DVT Prophylaxis : eliquis  Level of care: Med-Surg Status is: Inpatient Remains inpatient appropriate because: per ID recommendation patient will stay till 12/21 to complete IV antibiotic course and then transition to oral antibiotic as outpatient for 2 more weeks    TOTAL TIME TAKING CARE OF THIS PATIENT: 35 minutes.  >50% time spent on counselling and coordination of care  Note: This dictation was prepared with Dragon dictation along with smaller phrase technology. Any transcriptional errors that result from this process are unintentional.  Leita Blanch M.D    [facility]   CC: Primary care physician; Fernande Ophelia [name], MD

## 2024-04-17 NOTE — Progress Notes (Signed)
 Occupational Therapy Treatment Patient Details Name: Andrew Harding MRN: [id number] DOB: 29-May-1950 Today's Date: 04/17/2024   History of present illness Pt is a 73 y.o. male admitted with L facial cellulitis. PMH significant for IIDM, peripheral neuropathy, CAD, s/p CABG, HTN, PAF on Eliquis , s/p R TMA with chronic residual foot ulcer infection and osteomyelitis on wound VAC and chronic antibiotic therapy via PICC line, L BKA with prosthesis.   OT comments  Pt is supine in bed on arrival. Pleasant and agreeable to OT session. Pt performed bed mobility with MOD I. LB dressing to don prosthetic and post op shoe with MOD I. Pt required CGA for STS transfers during session and SPT to recliner with minimal weight bearing through RLE for stability. Pt wished to ambulate minimally ~10 ft to simulate his home environment for utilizing the bathroom at home as his W/C will not fit down the hallway of his home. Edu on BUE support on RW and WB through LLE and BUEs with minimal to no WB through RLE as much as possible. He demo good carryover and good safety with all tasks. Edu on use of MWC in the home where it will fit. He verbalized understanding.  Pt returned to bed with all needs in place and will cont to require skilled acute OT services to maximize his safety and IND to return to PLOF.       If plan is discharge home, recommend the following:  A little help with walking and/or transfers;A little help with bathing/dressing/bathroom;Assist for transportation   Equipment Recommendations  None recommended by OT    Recommendations for Other Services      Precautions / Restrictions Precautions Precautions: Fall Precaution/Restrictions Comments: L BKA, R TMA with wound vac (post-op shoe in room, prosthetic in room) Restrictions Weight Bearing Restrictions Per Provider Order: Yes RLE Weight Bearing Per Provider Order: Non weight bearing Other Position/Activity Restrictions: Per podiatry note minimal  weightbearing to the right foot only with transfer. Otherwise should remain NWB. Has L prosthesis in room.       Mobility Bed Mobility Overal bed mobility: Modified Independent                  Transfers Overall transfer level: Needs assistance Equipment used: Rolling walker (2 wheels) Transfers: Sit to/from Stand, Bed to chair/wheelchair/BSC Sit to Stand: Contact guard assist Stand pivot transfers: Contact guard assist         General transfer comment: transferred from bed to recliner then asked to ambulate short distance to simulate to his bathroom at home; able to do so with SBA; edu on utilizing W/C at home but he reports it will not fit down hallway to his bathroom; edu on importance of maintaining NWB to RLE as much as possible upon return home     Balance Overall balance assessment: Modified Independent, Needs assistance   Sitting balance-Leahy Scale: Normal       Standing balance-Leahy Scale: Good Standing balance comment: RW use                           ADL either performed or assessed with clinical judgement   ADL Overall ADL's : Needs assistance/impaired                     Lower Body Dressing: Sit to/from stand;Supervision/safety Lower Body Dressing Details (indicate cue type and reason): able to don R post op shoe and L prosthesis while sitting  EOB. Toilet Transfer: Rolling walker (2 wheels);Stand-pivot;Supervision/safety Statistician Details (indicate cue type and reason): simulated from bed to recliner using RW with SUPV/SBA         Functional mobility during ADLs: Supervision/safety;Rolling walker (2 wheels) General ADL Comments: reports he and podiatrist spoke about home environment and that he will have to ambulate short distances to the bathroom d/t wheelchair won't fit through doorways or hallway; he demo ability to maintain minimal WB on RLE with most of weight through LLE prosthetic during ~10 ft gait in room to mimic  distance to bathroom at home; will utilize his W/C for all mobility in living room and kitchen    Extremity/Trunk Assessment              Vision   Additional Comments: facial swelling, can see out of both eyes now, but L eye is swollen   Perception     Praxis     Communication Communication Communication: No apparent difficulties   Cognition Arousal: Alert Behavior During Therapy: WFL for tasks assessed/performed Cognition: No apparent impairments             OT - Cognition Comments: A&Ox4, very motivated, driven and determined.                 Following commands: Intact        Cueing   Cueing Techniques: Verbal cues  Exercises      Shoulder Instructions       General Comments      Pertinent Vitals/ Pain       Pain Assessment Pain Assessment: Faces Faces Pain Scale: Hurts a little bit Pain Descriptors / Indicators: Aching Pain Intervention(s): Monitored during session, Repositioned  Home Living                                          Prior Functioning/Environment              Frequency  Min 2X/week        Progress Toward Goals  OT Goals(current goals can now be found in the care plan section)  Progress towards OT goals: Progressing toward goals  Acute Rehab OT Goals OT Goal Formulation: With patient Time For Goal Achievement: 04/30/24 Potential to Achieve Goals: Good  Plan      Co-evaluation                 AM-PAC OT 6 Clicks Daily Activity     Outcome Measure   Help from another person eating meals?: None Help from another person taking care of personal grooming?: None Help from another person toileting, which includes using toliet, bedpan, or urinal?: A Little Help from another person bathing (including washing, rinsing, drying)?: A Little Help from another person to put on and taking off regular upper body clothing?: None Help from another person to put on and taking off regular lower body  clothing?: A Little 6 Click Score: 21    End of Session Equipment Utilized During Treatment: Rolling walker (2 wheels)  OT Visit Diagnosis: Other abnormalities of gait and mobility (R26.89)   Activity Tolerance Patient tolerated treatment well;No increased pain   Patient Left in bed;with call bell/phone within reach;with bed alarm set   Nurse Communication Mobility status        Time: 8393-8372 OT Time Calculation (min): 21 min  Charges: OT General Charges $OT Visit: 1  Visit OT Treatments $Therapeutic Activity: 8-22 mins  Euline Kimbler Harding, OTR/L  04/17/2024, 4:44 PM   Andrew Harding 04/17/2024, 4:41 PM

## 2024-04-18 DIAGNOSIS — E119 Type 2 diabetes mellitus without complications: Secondary | ICD-10-CM | POA: Diagnosis not present

## 2024-04-18 DIAGNOSIS — L03211 Cellulitis of face: Secondary | ICD-10-CM | POA: Diagnosis not present

## 2024-04-18 DIAGNOSIS — Z794 Long term (current) use of insulin: Secondary | ICD-10-CM | POA: Diagnosis not present

## 2024-04-18 DIAGNOSIS — L089 Local infection of the skin and subcutaneous tissue, unspecified: Secondary | ICD-10-CM | POA: Diagnosis not present

## 2024-04-18 LAB — GLUCOSE, CAPILLARY
Glucose-Capillary: 93 mg/dL (ref 70–99)
Glucose-Capillary: 112 mg/dL — ABNORMAL HIGH (ref 70–99)
Glucose-Capillary: 108 mg/dL — ABNORMAL HIGH (ref 70–99)
Glucose-Capillary: 102 mg/dL — ABNORMAL HIGH (ref 70–99)

## 2024-04-18 MED ORDER — POLYETHYLENE GLYCOL 3350 17 G PO PACK
17.0000 g | PACK | Freq: Every day | ORAL | Status: DC
  Administered 2024-04-18 – 2024-04-19 (×2): 17 g via ORAL
  Filled 2024-04-18 (×3): qty 1

## 2024-04-18 MED ORDER — CEFADROXIL 500 MG PO CAPS: 1000.0000 mg | ORAL_CAPSULE | Freq: Two times a day (BID) | ORAL | Status: DC

## 2024-04-18 NOTE — Consult Note (Addendum)
 WOC Nurse wound follow up Wound type: surgical R foot Measurement: 3.5 cm x 1 cm x 0.5 cm Wound bed: red, moist Drainage (amount, consistency, odor) scant, serosanguinous in cannister Periwound: intact Dressing procedure/placement/frequency:  Removed old NPWT dressing  Filled wound with   __1__ piece of black foam  Sealed NPWT dressing at HG  Patient tolerated procedure well    WOC nurse will continue to provide NPWT dressing changed due to the complexity of the dressing change.     Thank you,  Doyal Polite, MSN, RN, [facility] WOC Team [phone] (Available Mon-Fri 0700-1500)

## 2024-04-18 NOTE — Progress Notes (Signed)
 Pt refused bed alarm but was educated about its importance. Pt verbalized understanding but still refused.

## 2024-04-18 NOTE — TOC Progression Note (Signed)
 Transition of Care [facility]) - Progression Note    Patient Details  Name: Andrew Harding MRN: [id number] Date of Birth: [birth year]  [facility] CM/SW Contact  Nathanael CHRISTELLA Ring, RN Phone Number: 04/18/2024, 3:32 PM  Clinical Narrative:    Plan is for discharge on Monday.  CM reached out to [place] with [employer] about wound vac and notifying her that DC will be Monday.  She says everything should be all good from their end.    Expected Discharge Plan: Home w Home Health Services Barriers to Discharge: Continued Medical Work up               Expected Discharge Plan and Services   Discharge Planning Services: CM Consult Post Acute Care Choice: Home Health Living arrangements for the past 2 months: Single Family Home                           HH Arranged: RN, PT, OT, Nurse's Aide, IV Antibiotics           Social Drivers of Health (SDOH) Interventions SDOH Screenings   Food Insecurity: Patient Declined (04/14/2024)  Housing: Patient Declined (04/14/2024)  Transportation Needs: Patient Declined (04/14/2024)  Utilities: Patient Declined (04/14/2024)  Depression (PHQ2-9): Low Risk (05/03/2023)  Financial Resource Strain: Low Risk  (02/22/2024)   Received from [facility]  Social Connections: Patient Declined (04/14/2024)  Tobacco Use: Medium Risk (04/14/2024)    Readmission Risk Interventions    04/14/2024    2:51 PM 03/14/2024    2:43 PM 01/14/2024   10:52 AM  Readmission Risk Prevention Plan  Transportation Screening Complete Complete Complete  PCP or Specialist Appt within 3-5 Days Complete Complete Complete  HRI or Home Care Consult Complete  Complete  Social Work Consult for Recovery Care Planning/Counseling Complete Complete Complete  Palliative Care Screening Not Applicable Not Applicable Not Applicable  Medication Review Oceanographer) Complete Complete Complete

## 2024-04-18 NOTE — Progress Notes (Signed)
 [facility]  - [place] at [facility]   PATIENT NAME: Andrew Harding    MR#:  [id number]  DATE OF BIRTH:  1951-03-20  SUBJECTIVE:  no family at bedside. Patient overall improving. Tolerating PT Podiatry  recommend continue wound VAC at discharge also (he has a box in his room).   VITALS:  Blood pressure 120/67, pulse 80, temperature 98.4 F (36.9 C), resp. rate 16, height 6' 3 (1.905 m), weight 86.2 kg, SpO2 100%.  PHYSICAL EXAMINATION:   GENERAL:  73 y.o.-year-old patient with no acute distress.  LUNGS: Normal breath sounds bilaterally, no wheezing CARDIOVASCULAR: S1, S2 normal. No murmur   ABDOMEN: Soft, nontender, nondistended EXTREMITIES:Wound VAC right foot NEUROLOGIC: nonfocal  patient is alert and awake SKIN: shingles lesions scabbing over the left eye forehead, redness improving  LABORATORY PANEL:  CBC Recent Labs  Lab 04/17/24 0612  WBC 5.0  HGB 10.4*  HCT 33.0*  PLT 159    Chemistries  Recent Labs  Lab 04/14/24 1003 04/15/24 0947 04/17/24 0612  NA 140   < > 140  K 4.4   < > 3.9  CL 104   < > 104  CO2 25   < > 27  GLUCOSE 117*   < > 97  BUN 12   < > 9  CREATININE 1.27*   < > 1.07  CALCIUM  9.8   < > 8.9  AST 27  --   --   ALT <5  --   --   ALKPHOS 83  --   --   BILITOT 0.4  --   --    < > = values in this interval not displayed.   Assessment and Plan  Andrew Harding is a 73 y.o. male with medical history significant of IIDM, peripheral neuropathy, CAD, status post CABG, HTN, PAF on Eliquis , status post right TMA with chronic residual foot ulcer infection and osteomyelitis on wound VAC and chronic antibiotic therapy via PICC line, presented with worsening of left facial infection.   Left sided herpes zoster ophtlamicus - Case was discussed with on-call infectious disease who recommended acyclovir  IV, ophthalmology consulted  - Continue valtrex  1g PO TID for 10d Erythromycin   ointment to eyelids for comfort. F/u with [facility] in 1 week.   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   --Continue ancef  till 12/21 inhouse per ID Dr. Larina recommendation and then change to oral antibiotic --per ID--  Please see Dr. Nettie  note.  He is to finish a course of cefazolin  December 21. He can then be dced on 12/22 with oral cefadroxil  1 gram BID for 2 weeks further --please make sure pt has rx for it FU Dr Fayette as scheduled  --seen by Podiatry Dr. Ashley and recommends continue wound VAC for now at home also    Chronic afib  --Rate controlled  --Continue eliquis  and metoprolol     T2DM insulin  dependent  --A1c 7.1.  On Lantus  24 units at home, metformin  twice daily, and dulaglutide . -- continue lantus  24u, metformin  + SSI.    Normocytic anemia  --Appears to be at baseline hgb.     PVD s/p angioplasty and stent placement to the right popliteal and anterior tibial arteries  --Continue Asprin, statin   Procedures: Family communication :none Consults : podiatry, infectious disease CODE STATUS: full DVT Prophylaxis : eliquis  Level of  care: Med-Surg Status is: Inpatient Remains inpatient appropriate because: per ID recommendation patient will stay till 12/21 to complete IV antibiotic course and then transition to oral antibiotic as outpatient for 2 more weeks and f/I Dr Fayette    TOTAL TIME TAKING CARE OF THIS PATIENT: 35 minutes.  >50% time spent on counselling and coordination of care  Note: This dictation was prepared with Dragon dictation along with smaller phrase technology. Any transcriptional errors that result from this process are unintentional.  Leita Blanch M.D    [facility]   CC: Primary care physician; Fernande Ophelia [name], MD

## 2024-04-18 NOTE — Plan of Care (Signed)

## 2024-04-19 DIAGNOSIS — E119 Type 2 diabetes mellitus without complications: Secondary | ICD-10-CM | POA: Diagnosis not present

## 2024-04-19 DIAGNOSIS — Z794 Long term (current) use of insulin: Secondary | ICD-10-CM | POA: Diagnosis not present

## 2024-04-19 DIAGNOSIS — L03211 Cellulitis of face: Secondary | ICD-10-CM | POA: Diagnosis not present

## 2024-04-19 LAB — GLUCOSE, CAPILLARY
Glucose-Capillary: 96 mg/dL (ref 70–99)
Glucose-Capillary: 100 mg/dL — ABNORMAL HIGH (ref 70–99)
Glucose-Capillary: 140 mg/dL — ABNORMAL HIGH (ref 70–99)
Glucose-Capillary: 127 mg/dL — ABNORMAL HIGH (ref 70–99)

## 2024-04-19 LAB — CULTURE, BLOOD (ROUTINE X 2)
Culture: NO GROWTH
Culture: NO GROWTH
Special Requests: ADEQUATE

## 2024-04-19 NOTE — Progress Notes (Signed)
 [facility]  - [place] [facility]   PATIENT NAME: Andrew Harding    MR#:  [id number]  DATE OF BIRTH:  06-24-1950  SUBJECTIVE:  Patient was seen and examined today.  No new concern.  Pain seems controlled.   VITALS:  Blood pressure 125/75, pulse 72, temperature 98 F (36.7 C), temperature source Oral, resp. rate 17, height 6' 3 (1.905 m), weight 86.2 kg, SpO2 98%.  PHYSICAL EXAMINATION:   General.  Well-developed elderly man, in no acute distress.  Crusted herpes lesion involving left forehead and eyelid Pulmonary.  Lungs clear bilaterally, normal respiratory effort. CV.  Regular rate and rhythm, no JVD, rub or murmur. Abdomen.  Soft, nontender, nondistended, BS positive. CNS.  Alert and oriented .  No focal neurologic deficit. Extremities.  No edema, right TMA, left BKA Psychiatry.  Judgment and insight appears normal.   LABORATORY PANEL:  CBC Recent Labs  Lab 04/17/24 0612  WBC 5.0  HGB 10.4*  HCT 33.0*  PLT 159    Chemistries  Recent Labs  Lab 04/14/24 1003 04/15/24 0947 04/17/24 0612  NA 140   < > 140  K 4.4   < > 3.9  CL 104   < > 104  CO2 25   < > 27  GLUCOSE 117*   < > 97  BUN 12   < > 9  CREATININE 1.27*   < > 1.07  CALCIUM  9.8   < > 8.9  AST 27  --   --   ALT <5  --   --   ALKPHOS 83  --   --   BILITOT 0.4  --   --    < > = values in this interval not displayed.   Assessment and Plan  Andrew Harding is a 73 y.o. male with medical history significant of IIDM, peripheral neuropathy, CAD, status post CABG, HTN, PAF on Eliquis , status post right TMA with chronic residual foot ulcer infection and osteomyelitis on wound VAC and chronic antibiotic therapy via PICC line, presented with worsening of left facial infection.   Left sided herpes zoster ophtlamicus - Case was discussed with on-call infectious disease who recommended acyclovir  IV, ophthalmology consulted  - Continue valtrex  1g PO TID for 10d Erythromycin   ointment to eyelids for  comfort. F/u with [facility] in 1 week.  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   --Continue ancef  till 12/21 inhouse per ID Dr. Larina recommendation and then change to oral antibiotic --per ID--  Please see Dr. Nettie  note.  He is to finish a course of cefazolin  December 21. He can then be dced on 12/22 with oral cefadroxil  1 gram BID for 2 weeks further --please make sure pt has rx for it FU Dr Fayette as scheduled  --seen by Podiatry Dr. Ashley and recommends continue wound VAC for now at home also    Chronic afib  --Rate controlled  --Continue eliquis  and metoprolol     T2DM insulin  dependent  --A1c 7.1.  On Lantus  24 units at home, metformin  twice daily, and dulaglutide . -- continue lantus  24u, metformin  + SSI.    Normocytic anemia  --Appears to be at baseline hgb.     PVD s/p angioplasty and stent placement to the right popliteal and anterior tibial arteries  --Continue Asprin, statin   Procedures: Family communication :none Consults : podiatry,  infectious disease CODE STATUS: full DVT Prophylaxis : eliquis  Level of care: Med-Surg Status is: Inpatient Remains inpatient appropriate because: per ID recommendation patient will stay till 12/21 to complete IV antibiotic course and then transition to oral antibiotic as outpatient for 2 more weeks and f/I Dr Fayette    TOTAL TIME TAKING CARE OF THIS PATIENT: 45 minutes.  >50% time spent on counselling and coordination of care  Note: This dictation was prepared with Dragon dictation along with smaller phrase technology. Any transcriptional errors that result from this process are unintentional.  Amaryllis Dare M.D    [facility]   CC: Primary care physician; Fernande Ophelia [name], MD

## 2024-04-19 NOTE — Plan of Care (Signed)

## 2024-04-20 ENCOUNTER — Other Ambulatory Visit: Payer: Self-pay

## 2024-04-20 DIAGNOSIS — E119 Type 2 diabetes mellitus without complications: Secondary | ICD-10-CM | POA: Diagnosis not present

## 2024-04-20 DIAGNOSIS — Z794 Long term (current) use of insulin: Secondary | ICD-10-CM | POA: Diagnosis not present

## 2024-04-20 DIAGNOSIS — L03211 Cellulitis of face: Secondary | ICD-10-CM | POA: Diagnosis not present

## 2024-04-20 MED ORDER — CEFADROXIL 500 MG PO CAPS
1.0000 g | ORAL_CAPSULE | Freq: Two times a day (BID) | ORAL | 0 refills | Status: AC
  Filled 2024-04-20: qty 56, 14d supply, fill #0

## 2024-04-20 MED ORDER — VALACYCLOVIR HCL 1 G PO TABS
1000.0000 mg | ORAL_TABLET | Freq: Three times a day (TID) | ORAL | 0 refills | Status: AC
  Filled 2024-04-20: qty 15, 5d supply, fill #0

## 2024-04-20 NOTE — Progress Notes (Signed)
 PT Cancellation Note  Patient Details Name: Andrew Harding MRN: [id number] DOB: 02-20-1951   Cancelled Treatment:    Reason Eval/Treat Not Completed: Patient declined, no reason specified   Sherlean DELENA Lesches 04/20/2024, 9:59 AM

## 2024-04-20 NOTE — Discharge Instructions (Addendum)
 Home Health services will be provided by [facility]. They have been notified of your discharge. Please follow-up with them if you do not hear from them by tomorrow morning.   [facility]  [phone]

## 2024-04-20 NOTE — Discharge Summary (Signed)
 " Physician Discharge Summary   [name] MRN: [id number] DOB: [birth year]  Admit date:     04/14/2024  Discharge date: 04/20/2024  Discharge Physician: Amaryllis Dare   PCP: Fernande Ophelia [name], MD   Recommendations at discharge:  Please obtain CBC and CMP on follow-up Please ensure completion of 2 more weeks of cefadroxil  Follow-up with infectious disease Follow-up with primary care provider Follow-up with ophthalmology  Discharge Diagnoses: Principal Problem:   Facial cellulitis Active Problems:   Type 2 diabetes mellitus without complication, with long-term current use of insulin  [facility])  Hospital Course: Andrew Harding is a 73 y.o. male with medical history significant of IIDM, peripheral neuropathy, CAD, status post CABG, HTN, PAF on Eliquis , status post right TMA with chronic residual foot ulcer infection and osteomyelitis on wound VAC and chronic antibiotic therapy via PICC line, presented with worsening of left facial infection.  He was found to have left-sided herpes zoster, he was evaluated by ophthalmology and infectious disease.  He was given erythromycin  ointment to eyelids for comfort for 1 week.  He initially received IV acyclovir  followed by Valtrex  1 g p.o. 3 times daily for 10 days.  Herpes lesion crusted and improving.  Patient also has diabetic foot ulcers and right foot osteomyelitis s/p TMA and a wound VAC in place.  He was follow-up with outpatient wound care and infectious disease and was on cefazolin  until April 20, 2024, he completed the course of IV antibiotics while in the hospital and is being discharged on 2 more weeks of p.o. cefadroxil  1 g twice daily and will follow-up with infectious disease for further assistance.  He will follow-up with his podiatrist and continuing wound VAC for now.  Patient also has an history of chronic A-fib, remained rate controlled.  He will continue home metoprolol  and Eliquis .  Patient also has an history of peripheral  vascular disease s/p angioplasty and stent placement to the right popliteal and anterior tibial arteries and he will continue his home aspirin  and statin.  Follow-up with vascular surgery as recommended.  He will continue the rest of his home medications and follow-up with his providers for further assistance.   Consultants: Infectious disease Procedures performed: None Disposition: Home health Diet recommendation:  Cardiac and Carb modified diet DISCHARGE MEDICATION: Allergies as of 04/20/2024       Reactions   Cefepime  Itching        Medication List     STOP taking these medications    ceFAZolin  IVPB Commonly known as: ANCEF    Ozempic (1 MG/DOSE) 4 MG/3ML Sopn Generic drug: Semaglutide (1 MG/DOSE)       TAKE these medications    acetaminophen  325 MG tablet Commonly known as: TYLENOL  Take 2 tablets (650 mg total) by mouth every 4 (four) hours as needed for headache or mild pain (pain score 1-3).   aspirin  EC 81 MG tablet Take 1 tablet (81 mg total) by mouth daily. Swallow whole.   cefadroxil  500 MG capsule Commonly known as: DURICEF Take 2 capsules (1,000 mg total) by mouth 2 (two) times daily for 14 days.   cyanocobalamin  1000 MCG tablet Commonly known as: VITAMIN B12 Take 1 tablet (1,000 mcg total) by mouth daily.   Eliquis  5 MG Tabs tablet Generic drug: apixaban  TAKE 1 TABLET(5 MG) BY MOUTH TWICE DAILY   Fifty50 Pen Needles 32G X 4 MM Misc Generic drug: Insulin  Pen Needle USE 4 TIMES DAILY   FreeStyle Libre 2 Sensor Misc   gabapentin   300 MG capsule Commonly known as: NEURONTIN  Take 1 capsule (300 mg total) by mouth 3 (three) times daily.   heparin  flush 10 UNIT/ML injection Inject 5 mLs into the vein once. CVAD: FLUSH IV PORT DAILY   insulin  glargine 100 UNIT/ML Solostar Pen Commonly known as: LANTUS  Inject 28 Units into the skin at bedtime. What changed: when to take this   metFORMIN  500 MG 24 hr tablet Commonly known as:  GLUCOPHAGE -XR Take 1,000 mg by mouth 2 (two) times daily with a meal.   metoprolol  tartrate 25 MG tablet Commonly known as: LOPRESSOR  Take 0.5 tablets (12.5 mg total) by mouth 2 (two) times daily.   naloxone 4 MG/0.1ML Liqd nasal spray kit Commonly known as: NARCAN Place 1 spray into the nose 3 (three) times daily as needed.   OneTouch Ultra test strip Generic drug: glucose blood Use 3 (three) times daily   oxyCODONE  5 MG immediate release tablet Commonly known as: Oxy IR/ROXICODONE  Take 1-2 tablets (5-10 mg total) by mouth every 4 (four) hours as needed for moderate pain (pain score 4-6) or severe pain (pain score 7-10).   pravastatin  80 MG tablet Commonly known as: PRAVACHOL  Take 80 mg by mouth daily.   traZODone  50 MG tablet Commonly known as: DESYREL  Take 50 mg by mouth at bedtime.   Trulicity  0.75 MG/0.5ML Soaj Generic drug: Dulaglutide  Inject 0.75 mg into the skin once a week. Every Monday   valACYclovir  1000 MG tablet Commonly known as: VALTREX  Take 1 tablet (1,000 mg total) by mouth 3 (three) times daily for 5 days.               Discharge Care Instructions  (From admission, onward)           Start     Ordered   04/20/24 0000  Discharge wound care:       Comments: Wound care  Daily      Comments: Malleolus: Cleanse with saline, pat dry. Apply Xeroform daily and top with foam dressing. L heel: Paint the right heel wound with betadine  and allow to air dry.    Offload at all times.  Prevalon boot for offloading  Foam dressing  Every 3 days     Comments: Silicone foam dressings to the right medial malleolus, change every 3 days. ASSESS UNDER dressings each shift for any acute changes in the wounds.   04/20/24 1045            Contact information for follow-up providers     Fernande Ophelia [name], MD Follow up.   Specialty: Internal Medicine Why: hospital follow up Contact information: [address] [facility] [place]  [address] [phone]         Fayette Bodily, MD. Schedule an appointment as soon as possible for a visit in 2 week(s).   Specialty: Infectious Diseases Contact information: [address] [place] [address] [phone]              Contact information for after-discharge care     Home Medical Care     [facility]) .   Service: Home Health Services Contact information: [address] [place]  [address] [phone]                    Discharge Exam: Fredricka Weights   04/14/24 0959  Weight: 86.2 kg   General.  Well-developed elderly man, in no acute distress.  Healing  herpes zoster ulceration involving left side of forehead and eyelid. Pulmonary.  Lungs clear bilaterally, normal respiratory effort. CV.  Regular rate and rhythm, no JVD, rub or murmur. Abdomen.  Soft, nontender, nondistended, BS positive. CNS.  Alert and oriented .  No focal neurologic deficit. Extremities.  Right TMA with wound VAC, left BKA Psychiatry.  Judgment and insight appears normal.   Condition at discharge: stable  The results of significant diagnostics from this hospitalization (including imaging, microbiology, ancillary and laboratory) are listed below for reference.   Imaging Studies: CT Head Wo Contrast Result Date: 04/14/2024 EXAM: CT HEAD WITHOUT 04/14/2024 11:10:55 AM TECHNIQUE: CT of the head was performed without the administration of intravenous contrast. Automated exposure control, iterative reconstruction, and/or weight based adjustment of the mA/kV was utilized to reduce the radiation dose to as low as reasonably achievable. COMPARISON: CT orbit reported separately today (04/14/2024) and prior head CT 08/17/2022. CLINICAL HISTORY: 73 year old male with head trauma, suspected intracranial venous injury, minor head trauma, periorbital cellulitis, facial swelling, and a fall on 04/06/2024. FINDINGS: BRAIN  AND VENTRICLES: No acute intracranial hemorrhage. No mass effect or midline shift. No extra-axial fluid collection. No evidence of acute infarct. No hydrocephalus. Brain volume is stable and normal for age. Gray white differentiation is stable and normal for age. No suspicious intracranial vascular hyperdensity. Calcified atherosclerosis at the skull base. ORBITS: No acute abnormality. SINUSES AND MASTOIDS: Chronic paranasal sinus mucosal and mucoperiosteal thickening has not significantly changed. Tympanic cavities and mastoids are well aerated. SOFT TISSUES AND SKULL: Broad based and extensive scalp soft tissue swelling extending from the vertex inferiorly, anteriorly, and laterally on the left. This is contiguous with the abnormality described on orbit CT today separately. No scalp soft tissue gas identified. Calvarium appears stable and intact. No skull fracture. IMPRESSION: 1. Left scalp soft tissue abnormality, contiguous with Orbit CT findings (please see that report). 2. Normal for age non-contrast CT appearance of the brain. Electronically signed by: Helayne Hurst MD 04/14/2024 11:26 AM EST RP Workstation: [id number]   CT Orbits W Contrast Result Date: 04/14/2024 EXAM: CT ORBITS WITH CONTRAST 04/14/2024 11:10:55 AM TECHNIQUE: CT of the orbits was performed with the administration of 75 mL of iohexol  (OMNIPAQUE ) 300 MG/ML solution intravenous contrast. Multiplanar reformatted images are provided for review. Automated exposure control, iterative reconstruction, and/or weight based adjustment of the mA/kV was utilized to reduce the radiation dose to as low as reasonably achievable. COMPARISON: CT head reported separately today. CLINICAL HISTORY: 73 year old male with periorbital cellulitis, facial swelling, and a fall on 04/06/2024. FINDINGS: ORBITS: Postoperative changes to both globes. Normal extraocular muscles. Normal optic nerve-sheath complexes. Extensive left orbit preseptal space involvement. No  postseptal inflammation. Bilateral intraorbital soft tissues remain normal. No hematoma. No mass. Orbit walls appear intact. SOFT TISSUES: Broad based anterior scalp soft tissue swelling and stranding which continues inferiorly around the left orbit. Extensive left orbit preseptal space involvement. And continued confluent soft tissue swelling and stranding continuing lateral to the left orbit and along the visible left anterior face. No soft tissue gas or fluid collection identified. Reactive-appearing left parotid space lymph nodes. Otherwise negative visible parotid, masticator, and parapharyngeal spaces. SINUSES AND MASTOIDS: Minor paranasal sinus mucosal thickening. No layering sinus fluid or hemorrhage. Tympanic cavities and mastoids are well aerated. Ordinary nasal cavity mucosal thickening. BONES: No acute abnormality. VASCULATURE: Major vascular structures in the visible upper neck and at the skull base are enhancing and appear patent. Calcified ICA siphon atherosclerosis. BRAIN: Negative visible brain parenchyma.  IMPRESSION: 1. Broad based scalp, preseptal left orbit, and left face soft tissue swelling and inflammation compatible with cellulitis. 2. No postseptal orbital involvement. No soft tissue gas or fluid collection. Electronically signed by: Helayne Hurst MD 04/14/2024 11:22 AM EST RP Workstation: [id number]    Microbiology: Results for orders placed or performed during the hospital encounter of 04/14/24  Culture, blood (routine x 2)     Status: None   Collection Time: 04/14/24 10:03 AM   Specimen: BLOOD  Result Value Ref Range Status   Specimen Description BLOOD BLOOD LEFT FOREARM  Final   Special Requests   Final    BOTTLES DRAWN AEROBIC AND ANAEROBIC Blood Culture adequate volume   Culture   Final    NO GROWTH 5 DAYS Performed at [facility], [address] [place], [place] [address]    Report Status 04/19/2024 FINAL  Final  Culture, blood (routine x 2)     Status: None    Collection Time: 04/14/24 10:03 AM   Specimen: BLOOD  Result Value Ref Range Status   Specimen Description BLOOD BLOOD RIGHT FOREARM  Final   Special Requests   Final    BOTTLES DRAWN AEROBIC AND ANAEROBIC Blood Culture results may not be optimal due to an inadequate volume of blood received in culture bottles   Culture   Final    NO GROWTH 5 DAYS Performed at [facility], [address], [place], [place] [address]    Report Status 04/19/2024 FINAL  Final  Varicella-zoster by PCR     Status: Abnormal   Collection Time: 04/14/24  2:29 PM   Specimen: Face; Sterile Swab  Result Value Ref Range Status   Varicella-Zoster, PCR Positive (A) Negative Final    Comment: (NOTE) Varicella Zoster Virus DNA detected. This test was developed and its performance characteristics determined by [facility]. It has not been cleared or approved by the Food and Drug Administration. Performed At: [facility] [address] [place], [place] [id number] Jennette Shorter MD Ey:[id number]     Labs: CBC: Recent Labs  Lab 04/14/24 1003 04/15/24 0947 04/16/24 0606 04/17/24 0612  WBC 4.8 4.0 3.6* 5.0  NEUTROABS 3.4  --   --   --   HGB 12.4* 10.6* 10.1* 10.4*  HCT 38.0* 32.0* 31.4* 33.0*  MCV 87.0 84.9 88.0 87.5  PLT 167 137* 135* 159   Basic Metabolic Panel: Recent Labs  Lab 04/14/24 1003 04/15/24 0947 04/16/24 0606 04/17/24 0612  NA 140 135 137 140  K 4.4 4.2 3.8 3.9  CL 104 100 103 104  CO2 25 24 26 27   GLUCOSE 117* 172* 111* 97  BUN 12 11 9 9   CREATININE 1.27* 1.04 1.08 1.07  CALCIUM  9.8 8.5* 8.9 8.9   Liver Function Tests: Recent Labs  Lab 04/14/24 1003  AST 27  ALT <5  ALKPHOS 83  BILITOT 0.4  PROT 7.4  ALBUMIN 4.3   CBG: Recent Labs  Lab 04/18/24 2211 04/19/24 0841 04/19/24 1144 04/19/24 1704 04/19/24 2145  GLUCAP 102* 96 100* 140* 127*    Discharge time spent: greater than 30 minutes.  This record has been created using Software engineer. Errors have been sought and corrected,but may not always be located. Such creation errors do not reflect on the standard of care.   Signed: Amaryllis Dare, MD [facility] 04/20/2024 "

## 2024-04-20 NOTE — TOC Transition Note (Addendum)
 Transition of Care [facility]) - Discharge Note   Patient Details  Name: Andrew Harding MRN: [id number] Date of Birth: 04/15/1951  Transition of Care [facility]) CM/SW Contact:  [name] L Kennley Schwandt, LCSW Phone Number: 04/20/2024, 12:50 PM   Clinical Narrative:     Discharge orders are in. Patient established with [facility] for Home Health services. [facility] notified of discharge. No DME needed.   TOC signing off.   2:27pm: [facility] called and advised that they wouldn't have nursing for another week. CSW contacted patient to advise. Patient advised that he would like HHA provider to be changed. Patient chose [facility]. Selection changed in the portal.     Barriers to Discharge: Continued Medical Work up   Patient Goals and CMS Choice Patient states their goals for this hospitalization and ongoing recovery are:: Wants to go home with home health CMS Medicare.gov Compare Post Acute Care list provided to:: Patient Choice offered to / list presented to : Patient      Discharge Placement                       Discharge Plan and Services Additional resources added to the After Visit Summary for     Discharge Planning Services: CM Consult Post Acute Care Choice: Home Health                    HH Arranged: RN, PT, OT, Nurse's Aide, IV Antibiotics          Social Drivers of Health (SDOH) Interventions SDOH Screenings   Food Insecurity: Patient Declined (04/14/2024)  Housing: Patient Declined (04/14/2024)  Transportation Needs: Patient Declined (04/14/2024)  Utilities: Patient Declined (04/14/2024)  Depression (PHQ2-9): Low Risk (05/03/2023)  Financial Resource Strain: Low Risk  (02/22/2024)   Received from [facility]  Social Connections: Patient Declined (04/14/2024)  Tobacco Use: Medium Risk (04/14/2024)     Readmission Risk Interventions    04/14/2024    2:51 PM 03/14/2024    2:43 PM 01/14/2024   10:52 AM  Readmission Risk Prevention  Plan  Transportation Screening Complete Complete Complete  PCP or Specialist Appt within 3-5 Days Complete Complete Complete  HRI or Home Care Consult Complete  Complete  Social Work Consult for Recovery Care Planning/Counseling Complete Complete Complete  Palliative Care Screening Not Applicable Not Applicable Not Applicable  Medication Review Oceanographer) Complete Complete Complete

## 2024-04-20 NOTE — Progress Notes (Signed)
 Patient refused morning vital sign, blood glucose check and nursing assessment.  Dr Caleen notified.

## 2024-04-21 ENCOUNTER — Ambulatory Visit (INDEPENDENT_AMBULATORY_CARE_PROVIDER_SITE_OTHER): Admitting: Vascular Surgery

## 2024-05-06 NOTE — Progress Notes (Signed)
 Goals     . * Maintain health/healthy lifestyle (pt-stated)        *Some images could not be shown.

## 2024-05-08 ENCOUNTER — Ambulatory Visit: Attending: Infectious Diseases | Admitting: Infectious Diseases

## 2024-05-08 ENCOUNTER — Encounter: Payer: Self-pay | Admitting: Infectious Diseases

## 2024-05-08 VITALS — BP 117/73 | HR 77 | Temp 98.8°F

## 2024-05-08 DIAGNOSIS — E1151 Type 2 diabetes mellitus with diabetic peripheral angiopathy without gangrene: Secondary | ICD-10-CM | POA: Insufficient documentation

## 2024-05-08 DIAGNOSIS — B9561 Methicillin susceptible Staphylococcus aureus infection as the cause of diseases classified elsewhere: Secondary | ICD-10-CM | POA: Insufficient documentation

## 2024-05-08 DIAGNOSIS — Z7984 Long term (current) use of oral hypoglycemic drugs: Secondary | ICD-10-CM | POA: Diagnosis not present

## 2024-05-08 DIAGNOSIS — Z89411 Acquired absence of right great toe: Secondary | ICD-10-CM | POA: Insufficient documentation

## 2024-05-08 DIAGNOSIS — Z7901 Long term (current) use of anticoagulants: Secondary | ICD-10-CM | POA: Insufficient documentation

## 2024-05-08 DIAGNOSIS — Z7982 Long term (current) use of aspirin: Secondary | ICD-10-CM | POA: Insufficient documentation

## 2024-05-08 DIAGNOSIS — T8743 Infection of amputation stump, right lower extremity: Secondary | ICD-10-CM | POA: Insufficient documentation

## 2024-05-08 DIAGNOSIS — M8610 Other acute osteomyelitis, unspecified site: Secondary | ICD-10-CM

## 2024-05-08 DIAGNOSIS — A4901 Methicillin susceptible Staphylococcus aureus infection, unspecified site: Secondary | ICD-10-CM

## 2024-05-08 DIAGNOSIS — I4891 Unspecified atrial fibrillation: Secondary | ICD-10-CM | POA: Diagnosis not present

## 2024-05-08 DIAGNOSIS — I739 Peripheral vascular disease, unspecified: Secondary | ICD-10-CM

## 2024-05-08 DIAGNOSIS — Z89512 Acquired absence of left leg below knee: Secondary | ICD-10-CM | POA: Insufficient documentation

## 2024-05-08 DIAGNOSIS — Z951 Presence of aortocoronary bypass graft: Secondary | ICD-10-CM | POA: Diagnosis not present

## 2024-05-08 DIAGNOSIS — I10 Essential (primary) hypertension: Secondary | ICD-10-CM | POA: Insufficient documentation

## 2024-05-08 DIAGNOSIS — Z794 Long term (current) use of insulin: Secondary | ICD-10-CM | POA: Diagnosis not present

## 2024-05-08 DIAGNOSIS — E1140 Type 2 diabetes mellitus with diabetic neuropathy, unspecified: Secondary | ICD-10-CM | POA: Diagnosis not present

## 2024-05-08 DIAGNOSIS — I2510 Atherosclerotic heart disease of native coronary artery without angina pectoris: Secondary | ICD-10-CM | POA: Diagnosis not present

## 2024-05-08 DIAGNOSIS — Z95820 Peripheral vascular angioplasty status with implants and grafts: Secondary | ICD-10-CM | POA: Diagnosis not present

## 2024-05-08 DIAGNOSIS — Z7985 Long-term (current) use of injectable non-insulin antidiabetic drugs: Secondary | ICD-10-CM | POA: Diagnosis not present

## 2024-05-08 DIAGNOSIS — E11628 Type 2 diabetes mellitus with other skin complications: Secondary | ICD-10-CM

## 2024-05-08 NOTE — Patient Instructions (Addendum)
 You are here for the rt foot infection Reduce cefadroxil  to 1 tablet 500mg  twice a dya- follow up with podiatrist Make the appt with wound clunic and will see you in 1 month

## 2024-05-08 NOTE — Progress Notes (Signed)
 NAME: JEMAL MISKELL  DOB: 05/05/1950  MRN: [id number]  Date/Time: 05/08/2024 9:13 AM   Subjective:   Andrew Harding is a 74 y.o. male with a history of diabetes mellitus, peripheral neuropathy, PAD left BKA ,CAD status post CABG, hypertension,  A-fib on Eliquis , right great toe amputation  Here with his friend Follow up for rt foot infection  On cefadroxil  1 gram BID after completing 5 weeks of IV cefazolin  on 12/21 Followed by podiatrist Is being referred to wound clinic for hyperbaric oxygen rx  Was recently hopsitlaized for shingles left side of the face over the forehead and got valtrex  12/15-12/21 Doing better    Past infectious history Patient  in the month of May 2025   underwent excision first metatarsal right foot along with excision of the sesamoid  right first MTP joint by Dr. Ashley..  Culture then was MRSA.  Patient placed on  Bactrim   Patient at that time also underwent angio and had angioplasty and stent placement to the right anterior tibial artery, percutaneous transluminal angioplasty of the right mid popliteal artery and the right tibioperoneal trunk.. As the wound progressed he underwent TMA on 11/02/23 Pathology acute osteo but margin clear of osteo- culture MRSA/ enterococcus fecalis - decided to treat with 4 weeks of IV dapto which were extended by 2 more weeks to complete 6 weeks on 12/14/2023 The wound healing has been complicated by superficial necrosis of the surgical site and also some dehiscence He also had developed 2 pressure ulcers on the malleolus from the cam boot.  This has gotten better since he removed the cam boot. HE had debridement on 8/22- culture was enterobacter , pseudeschericia, and granulocutela- He was placed on augmentin  and levaquin .He was readmitted from 01/10/2024 until 01/16/2024 and further debridement of TMA site was done. 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 A culture with Enterobacter and Prevotella. He underwent angio on 01/15/2024  and the findings were as below The right common femoral was widely patent as was the profunda femoris.  The SFA was widely patent With 3 lesions that were hemodynamically significant and these were in the mid popliteal area as well as the ostial lesion in the anterior tibial artery.  The critical lesion in the mid anterior tibial artery appears to be more of a hyperplastic response when compared to the previous study.  The previously placed stent in the TP trunk was widely patent Peroneal is patent down to the ankle but does not seem to contribute to the foot much. Posterior tibial was patent proximally but demonstrated diffuse disease which is rather extensive in its midportion and then its distal one third is occluded and there is minimal if any reconstitution of the plantar arteries. Following angioplasty and stent placement in 2 locations of the anterior tibial there was inline flow and looked quite nice with less than 10% residual stenosis.  Angioplasty and stent placement of the mid popliteal yielded an excellent result with less than 10% residual stenosis so there was successful recannulization of the right lower extremity for limb salvage.  He was discharged on ertapenem  IV and completed 6 weeks of it on 02/21/2024 and PICC line was removed. He was doing well and was off antibiotics until 03/06/2024 when he was restarted on Levaquin and Augmentin  after culture that was taken by Dr. Ashley in his office. culture was MSSA .   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)  09/03/2023   Atrial fibrillation with RVR (HCC) 08/19/2022   Bladder neck obstruction    Cellulitis 08/17/2022   Chronic kidney disease    Coronary artery disease    a.) s/p 4v CABG in 2014   Diabetes mellitus without complication (HCC)    Diabetic neuropathy (HCC)    Diabetic peripheral neuropathy (HCC)    Diabetic ulcer of toe of left foot associated with diabetes mellitus of other type, limited to breakdown of skin (HCC) 12/21/2017   Diverticulosis    Gout    Gram-negative bacteremia 05/11/2023   Heart murmur    History of osteomyelitis 05/31/2015   Hypercholesteremia    Hyperlipidemia    Hypertension    Hypotension due to hypovolemia 08/17/2022   Infection of left foot 08/19/2022   MSSA bacteremia 02/28/2023   Open wound of left foot with complication 05/10/2023   Osteomyelitis (HCC) 05/31/2015   Peripheral neuropathy    Postural dizziness with presyncope 02/26/2023   S/P BKA (below knee amputation) unilateral, 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   S/P transmetatarsal amputation of foot, left (HCC) 08/29/2022   Sepsis (HCC) 08/17/2022   Sepsis (HCC) 05/10/2023   Status post amputation of toe of right foot 11/01/2015   Tubular adenoma    Vitamin D  deficiency     Past Surgical History:  Procedure Laterality Date   ABDOMINAL AORTOGRAM W/LOWER EXTREMITY N/A 11/05/2023   Procedure: ABDOMINAL AORTOGRAM W/LOWER EXTREMITY;  Surgeon: Marea Selinda RAMAN, MD;  Location: [facility];  Service: Cardiovascular;  Laterality: N/A;   ACHILLES TENDON SURGERY Right 09/14/2023   Procedure: TENOTOMY, ACHILLES;  Surgeon: [name], DPM;  Location: [facility];  Service: Orthopedics/Podiatry;  Laterality: Right;   AMPUTATION Left 08/19/2022   Procedure: TRANSMETATARSAL AMPUTATION LEFT FOOT WITH IRRIGATION AND DEBRIDEMENT;  Surgeon: Lennie Barter, DPM;  Location: [facility];  Service: Podiatry;  Laterality: Left;   AMPUTATION Left 05/16/2023   Procedure: AMPUTATION BELOW KNEE;   Surgeon: Marea Selinda RAMAN, MD;  Location: [facility];  Service: General;  Laterality: Left;   AMPUTATION Right 09/14/2023   Procedure: AMPUTATION, FOOT, RAY;  Surgeon: [name], DPM;  Location: [facility];  Service: Orthopedics/Podiatry;  Laterality: Right;   AMPUTATION TOE Right 06/01/2015   Procedure: AMPUTATION TOE;  Surgeon: Donnice Cory, DPM;  Location: [facility];  Service: Podiatry;  Laterality: Right;   AMPUTATION TOE Left 08/11/2022   Procedure: AMPUTATION TOE 2, 3, 4;  Surgeon: [name], DPM;  Location: [facility];  Service: Podiatry;  Laterality: Left;   APPLICATION OF WOUND VAC Right 03/13/2024   Procedure: APPLICATION, WOUND VAC;  Surgeon: [name], DPM;  Location: [facility];  Service: Orthopedics/Podiatry;  Laterality: Right;   CATARACT EXTRACTION, BILATERAL     CIRCUMCISION N/A 06/12/2022   Procedure: CIRCUMCISION ADULT;  Surgeon: Penne Knee, MD;  Location: [facility];  Service: Urology;  Laterality: N/A;   COLONOSCOPY WITH PROPOFOL  N/A 02/14/2016   Procedure: COLONOSCOPY WITH PROPOFOL ;  Surgeon: Gladis RAYMOND Mariner, MD;  Location: [facility];  Service: Endoscopy;  Laterality: N/A;   COLONOSCOPY WITH PROPOFOL  N/A 01/07/2019  Procedure: COLONOSCOPY WITH PROPOFOL ;  Surgeon: Gaylyn Gladis PENNER, MD;  Location: [facility];  Service: Endoscopy;  Laterality: N/A;   CORONARY ARTERY BYPASS GRAFT N/A 08/2012   EXCISION PARTIAL PHALANX Right 06/01/2015   Procedure: EXCISION PARTIAL PHALANX /  BONE;  Surgeon: Donnice Cory, DPM;  Location: [facility];  Service: Podiatry;  Laterality: Right;   FLEXIBLE SIGMOIDOSCOPY N/A 05/29/2016   Procedure: FLEXIBLE SIGMOIDOSCOPY;  Surgeon: Gladis PENNER Gaylyn, MD;  Location: [facility] ENDOSCOPY;  Service: Endoscopy;  Laterality: N/A;   INCISION AND DRAINAGE Left 08/23/2022   Procedure: INCISION AND DRAINAGE;  Surgeon: [name], DPM;  Location: [facility];  Service: Podiatry;  Laterality: Left;   INCISION AND DRAINAGE OF WOUND Left 09/15/2022    Procedure: 11044 - DEBRIDE BONE and EXCISION IF 1ST METATARSAL BONE WITH  DELAY PRIMARY CLOSURE;  Surgeon: [name], DPM;  Location: [facility];  Service: Podiatry;  Laterality: Left;   IRRIGATION AND DEBRIDEMENT FOOT Left 02/28/2023   Procedure: IRRIGATION AND DEBRIDEMENT FOOT;  Surgeon: [name], DPM;  Location: [facility];  Service: Orthopedics/Podiatry;  Laterality: Left;   IRRIGATION AND DEBRIDEMENT FOOT Right 01/11/2024   Procedure: IRRIGATION AND DEBRIDEMENT FOOT;  Surgeon: [name], DPM;  Location: [facility];  Service: Orthopedics/Podiatry;  Laterality: Right;   IRRIGATION AND DEBRIDEMENT FOOT Right 03/13/2024   Procedure: IRRIGATION AND DEBRIDEMENT FOOT;  Surgeon: [name], DPM;  Location: [facility];  Service: Orthopedics/Podiatry;  Laterality: Right;   KNEE ARTHROSCOPY Left    LOWER EXTREMITY ANGIOGRAPHY Left 08/22/2022   Procedure: Lower Extremity Angiography;  Surgeon: Jama Cordella MATSU, MD;  Location: [facility];  Service: Cardiovascular;  Laterality: Left;   LOWER EXTREMITY ANGIOGRAPHY Left 05/11/2023   Procedure: Lower Extremity Angiography;  Surgeon: Marea Selinda RAMAN, MD;  Location: [facility];  Service: Cardiovascular;  Laterality: Left;   LOWER EXTREMITY ANGIOGRAPHY Right 09/11/2023   Procedure: Lower Extremity Angiography;  Surgeon: Jama Cordella MATSU, MD;  Location: [facility];  Service: Cardiovascular;  Laterality: Right;   LOWER EXTREMITY ANGIOGRAPHY Right 01/15/2024   Procedure: Lower Extremity Angiography;  Surgeon: Jama Cordella MATSU, MD;  Location: [facility];  Service: Cardiovascular;  Laterality: Right;   LOWER EXTREMITY ANGIOGRAPHY Left 03/11/2024   Procedure: Lower Extremity Angiography;  Surgeon: Jama Cordella MATSU, MD;  Location: [facility];  Service: Cardiovascular;  Laterality: Left;   LOWER EXTREMITY INTERVENTION Right 09/11/2023   Procedure: LOWER EXTREMITY INTERVENTION;  Surgeon: Jama Cordella MATSU, MD;   Location: [facility];  Service: Cardiovascular;  Laterality: Right;   TEE WITHOUT CARDIOVERSION N/A 03/01/2023   Procedure: TRANSESOPHAGEAL ECHOCARDIOGRAM;  Surgeon: Alluri, Keller BROCKS, MD;  Location: [facility];  Service: Cardiovascular;  Laterality: N/A;   TRANSMETATARSAL AMPUTATION Right 11/02/2023   Procedure: AMPUTATION, FOOT, TRANSMETATARSAL;  Surgeon: [name], DPM;  Location: [facility];  Service: Orthopedics/Podiatry;  Laterality: Right;   TRANSMETATARSAL AMPUTATION Right 12/21/2023   Procedure: REVISION, AMPUTATION, FOOT, TRANSMETATARSAL;  Surgeon: [name], DPM;  Location: [facility];  Service: Orthopedics/Podiatry;  Laterality: Right;   WOUND DEBRIDEMENT Left 09/15/2022   Procedure: 11043 - DEBRIDE SKIN. MUSCLE FASCIA;  Surgeon: [name], DPM;  Location: [facility];  Service: Podiatry;  Laterality: Left;    Social History   Socioeconomic History   Marital status: Divorced    Spouse name: Ashley,Angela C   Number of children: Not on file   Years of education: Not on file   Highest education level: Not on file  Occupational  History   Not on file  Tobacco Use   Smoking status: Former    Types: Cigars   Smokeless tobacco: Never  Vaping Use   Vaping status: Never Used  Substance and Sexual Activity   Alcohol use: No   Drug use: No   Sexual activity: Never  Other Topics Concern   Not on file  Social History Narrative   Patient currently resides at    [employer]   [address]  [place], [place]  [phone]      Patient is legally separated and lives at home by himself. His ex -wife is his HCPOA. Neighbors and friends are his support system. He used to work for [employer]    Social Drivers of Health   Tobacco Use: Medium Risk (05/08/2024)   Patient History    Smoking Tobacco Use: Former    Smokeless Tobacco Use: Never    Passive Exposure: Not on file  Financial Resource Strain: Low Risk  (05/06/2024)   Received from [facility]   Overall Financial Resource Strain (CARDIA)    Difficulty of Paying Living Expenses: Not hard at all  Food Insecurity: No Food Insecurity (05/06/2024)   Received from [facility]   Epic    Within the past 12 months, you worried that your food would run out before you got the money to buy more.: Never true    Within the past 12 months, the food you bought just didn't last and you didn't have money to get more.: Never true  Transportation Needs: No Transportation Needs (05/06/2024)   Received from [facility] - Transportation    In the past 12 months, has lack of transportation kept you from medical appointments or from getting medications?: No    Lack of Transportation (Non-Medical): No  Physical Activity: Not on file  Stress: Not on file  Social Connections: Patient Declined (04/14/2024)   Social Connection and Isolation Panel    Frequency of Communication with Friends and Family: Patient declined    Frequency of Social Gatherings with Friends and Family: Patient declined    Attends Religious Services: Patient declined    Active Member of Clubs or Organizations: Patient declined    Attends Banker Meetings: Patient declined    Marital Status: Patient declined  Intimate Partner Violence: Patient Declined (04/14/2024)   Epic    Fear of Current or Ex-Partner: Patient declined    Emotionally Abused: Patient declined    Physically Abused: Patient declined    Sexually Abused: Patient declined  Depression (PHQ2-9): Low Risk (05/03/2023)   Depression (PHQ2-9)    PHQ-2 Score: 0  Alcohol Screen: Not on file  Housing: Low Risk  (05/06/2024)   Received from [facility]   Epic    In the last 12 months, was there a time when you were not able to pay the mortgage or rent on time?: No    In the past 12 months, how many times have you moved where you were living?: 0    At any time in the past 12  months, were you homeless or living in a shelter (including now)?: No  Utilities: Not At Risk (05/06/2024)   Received from [facility]   Epic    In the past 12 months has the electric, gas, oil, or water company threatened to shut off services in your home?: No  Health Literacy: Not on  file    Family History  Problem Relation Age of Onset   Diabetes Mellitus II Mother    CAD Mother    Allergies  Allergen Reactions   Cefepime  Itching   I? Current Outpatient Medications  Medication Sig Dispense Refill   acetaminophen  (TYLENOL ) 325 MG tablet Take 2 tablets (650 mg total) by mouth every 4 (four) hours as needed for headache or mild pain (pain score 1-3). 180 tablet 0   aspirin  EC 81 MG tablet Take 1 tablet (81 mg total) by mouth daily. Swallow whole. 30 tablet 12   cefadroxil  (DURICEF) 500 MG capsule Take 500 mg by mouth 2 (two) times daily.     Continuous Glucose Sensor (FREESTYLE LIBRE 2 SENSOR) MISC      cyanocobalamin  (VITAMIN B12) 1000 MCG tablet Take 1 tablet (1,000 mcg total) by mouth daily.     Dulaglutide  (TRULICITY ) 0.75 MG/0.5ML SOAJ Inject 0.75 mg into the skin once a week. Every Monday     ELIQUIS  5 MG TABS tablet TAKE 1 TABLET(5 MG) BY MOUTH TWICE DAILY 60 tablet 2   glucose blood (ONETOUCH ULTRA) test strip Use 3 (three) times daily     heparin  flush 10 UNIT/ML injection Inject 5 mLs into the vein once. CVAD: FLUSH IV PORT DAILY     insulin  glargine (LANTUS ) 100 UNIT/ML Solostar Pen Inject 28 Units into the skin at bedtime. (Patient taking differently: Inject 28 Units into the skin daily.)     Insulin  Pen Needle (FIFTY50 PEN NEEDLES) 32G X 4 MM MISC USE 4 TIMES DAILY     metFORMIN  (GLUCOPHAGE -XR) 500 MG 24 hr tablet Take 1,000 mg by mouth 2 (two) times daily with a meal.     metoprolol  tartrate (LOPRESSOR ) 25 MG tablet Take 0.5 tablets (12.5 mg total) by mouth 2 (two) times daily.     naloxone (NARCAN) nasal spray 4 mg/0.1 mL Place 1 spray into the nose 3  (three) times daily as needed.     pravastatin  (PRAVACHOL ) 80 MG tablet Take 80 mg by mouth daily.     pregabalin (LYRICA) 100 MG capsule Take 100 mg by mouth 2 (two) times daily.     traZODone  (DESYREL ) 50 MG tablet Take 50 mg by mouth at bedtime.     No current facility-administered medications for this visit.     Abtx:  Anti-infectives (From admission, onward)    None       REVIEW OF SYSTEMS:  Const: negative fever, negative chills, negative weight loss Eyes: negative diplopia or visual changes, negative eye pain ENT: negative coryza, negative sore throat Resp: negative cough, hemoptysis, dyspnea Cards: negative for chest pain, palpitations, lower extremity edema GU: negative for frequency, dysuria and hematuria GI: Negative for abdominal pain, diarrhea, bleeding, constipation Skin: negative for rash and pruritus Heme: negative for easy bruising and gum/nose bleeding MS: as above Neurolo:negative for headaches, dizziness, vertigo, memory problems  Psych: in good spirits  Endocrine: , diabetes Allergy/Immunology- negative for any medication or food allergies ?  Objective:  VITALS:  BP 117/73   Pulse 77   Temp 98.8 F (37.1 C) (Temporal)   SpO2 98%  LDA Rt PICC PHYSICAL EXAM:  General: looks well Lungs: Clear to auscultation bilaterally. No Wheezing or Rhonchi. No rales. Heart: Regular rate and rhythm, no murmur, rub or gallop. Abdomen: did not examine Extremities: rt foot TMA site-     04/01/24              10//2/25   01/08/24  12/13/23   11/29/23    11/20/23          Left leg BKA Prosthetic leg Skin: No rashes or lesions. Or bruising Lymph: Cervical, supraclavicular normal. Neurologic: Grossly non-focal Pertinent Labs Lab Results from 04/17/24 Hb 10.4 WBC 5   ? Impression/Recommendation ?Diabetic foot infection with peripheral artery disease ,involving the right foot at the site of prior TMA- osteo Recent 5th met  resection Ongoing wound but has improved a lot - recent MSSA infection 5vweeks of cefazolin   completed on 04/20/24- d- on cefadroxil  1 gram BID- will reduce to 500mg  BID Reduce cefadroxil  to 500mg  BID Follow up WC for possible HBO f    PAD rt leg Followed by vein and vascular Multiple stents   left BKA    CAD Status post CABG   A-fib on  Eliquis    ?  ________________________________________________ Discussed with patient, and his friend-  Follow 8  weeks

## 2024-05-14 ENCOUNTER — Encounter: Attending: Physician Assistant | Admitting: Physician Assistant

## 2024-05-14 DIAGNOSIS — I2510 Atherosclerotic heart disease of native coronary artery without angina pectoris: Secondary | ICD-10-CM | POA: Diagnosis not present

## 2024-05-14 DIAGNOSIS — E11621 Type 2 diabetes mellitus with foot ulcer: Secondary | ICD-10-CM | POA: Insufficient documentation

## 2024-05-14 DIAGNOSIS — N1831 Chronic kidney disease, stage 3a: Secondary | ICD-10-CM | POA: Diagnosis not present

## 2024-05-14 DIAGNOSIS — M86671 Other chronic osteomyelitis, right ankle and foot: Secondary | ICD-10-CM | POA: Insufficient documentation

## 2024-05-14 DIAGNOSIS — I480 Paroxysmal atrial fibrillation: Secondary | ICD-10-CM | POA: Diagnosis not present

## 2024-05-14 DIAGNOSIS — Z7901 Long term (current) use of anticoagulants: Secondary | ICD-10-CM | POA: Insufficient documentation

## 2024-05-14 DIAGNOSIS — L97512 Non-pressure chronic ulcer of other part of right foot with fat layer exposed: Secondary | ICD-10-CM | POA: Diagnosis not present

## 2024-05-14 DIAGNOSIS — E1151 Type 2 diabetes mellitus with diabetic peripheral angiopathy without gangrene: Secondary | ICD-10-CM | POA: Insufficient documentation

## 2024-05-14 DIAGNOSIS — Z89512 Acquired absence of left leg below knee: Secondary | ICD-10-CM | POA: Diagnosis not present

## 2024-05-14 DIAGNOSIS — L89614 Pressure ulcer of right heel, stage 4: Secondary | ICD-10-CM | POA: Diagnosis not present

## 2024-05-14 DIAGNOSIS — E1122 Type 2 diabetes mellitus with diabetic chronic kidney disease: Secondary | ICD-10-CM | POA: Diagnosis not present

## 2024-05-21 ENCOUNTER — Encounter: Admitting: Physician Assistant

## 2024-05-21 DIAGNOSIS — E11621 Type 2 diabetes mellitus with foot ulcer: Secondary | ICD-10-CM | POA: Diagnosis not present

## 2024-05-28 ENCOUNTER — Encounter: Admitting: Physician Assistant

## 2024-05-28 DIAGNOSIS — E11621 Type 2 diabetes mellitus with foot ulcer: Secondary | ICD-10-CM | POA: Diagnosis not present

## 2024-05-28 LAB — GLUCOSE, CAPILLARY
Glucose-Capillary: 120 mg/dL — ABNORMAL HIGH (ref 70–99)
Glucose-Capillary: 125 mg/dL — ABNORMAL HIGH (ref 70–99)
Glucose-Capillary: 120 mg/dL — ABNORMAL HIGH (ref 70–99)

## 2024-05-29 ENCOUNTER — Encounter: Admitting: Physician Assistant

## 2024-05-29 DIAGNOSIS — E11621 Type 2 diabetes mellitus with foot ulcer: Secondary | ICD-10-CM | POA: Diagnosis not present

## 2024-05-29 LAB — GLUCOSE, CAPILLARY
Glucose-Capillary: 204 mg/dL — ABNORMAL HIGH (ref 70–99)
Glucose-Capillary: 137 mg/dL — ABNORMAL HIGH (ref 70–99)

## 2024-05-30 ENCOUNTER — Encounter: Admitting: Physician Assistant

## 2024-05-30 DIAGNOSIS — E11621 Type 2 diabetes mellitus with foot ulcer: Secondary | ICD-10-CM | POA: Diagnosis not present

## 2024-05-30 LAB — GLUCOSE, CAPILLARY
Glucose-Capillary: 148 mg/dL — ABNORMAL HIGH (ref 70–99)
Glucose-Capillary: 158 mg/dL — ABNORMAL HIGH (ref 70–99)

## 2024-06-02 ENCOUNTER — Encounter: Admitting: Physician Assistant

## 2024-06-03 ENCOUNTER — Encounter: Admitting: Physician Assistant

## 2024-06-04 ENCOUNTER — Encounter: Admitting: Physician Assistant

## 2024-06-04 LAB — GLUCOSE, CAPILLARY
Glucose-Capillary: 94 mg/dL (ref 70–99)
Glucose-Capillary: 133 mg/dL — ABNORMAL HIGH (ref 70–99)

## 2024-06-05 ENCOUNTER — Encounter: Admitting: Physician Assistant

## 2024-06-05 LAB — GLUCOSE, CAPILLARY
Glucose-Capillary: 117 mg/dL — ABNORMAL HIGH (ref 70–99)
Glucose-Capillary: 151 mg/dL — ABNORMAL HIGH (ref 70–99)
Glucose-Capillary: 98 mg/dL (ref 70–99)

## 2024-06-06 ENCOUNTER — Encounter: Admitting: Physician Assistant

## 2024-06-06 LAB — GLUCOSE, CAPILLARY
Glucose-Capillary: 178 mg/dL — ABNORMAL HIGH (ref 70–99)
Glucose-Capillary: 157 mg/dL — ABNORMAL HIGH (ref 70–99)

## 2024-06-09 ENCOUNTER — Encounter: Admitting: Physician Assistant

## 2024-06-10 ENCOUNTER — Ambulatory Visit: Admitting: Infectious Diseases

## 2024-06-10 ENCOUNTER — Encounter: Admitting: Physician Assistant

## 2024-06-11 ENCOUNTER — Encounter: Admitting: Physician Assistant

## 2024-06-12 ENCOUNTER — Encounter: Admitting: Physician Assistant

## 2024-06-16 ENCOUNTER — Encounter: Admitting: Physician Assistant

## 2024-06-17 ENCOUNTER — Encounter: Admitting: Physician Assistant

## 2024-06-18 ENCOUNTER — Encounter: Admitting: Physician Assistant

## 2024-06-19 ENCOUNTER — Encounter: Admitting: Physician Assistant

## 2024-06-23 ENCOUNTER — Encounter: Admitting: Physician Assistant

## 2024-06-24 ENCOUNTER — Encounter: Admitting: Physician Assistant

## 2024-06-25 ENCOUNTER — Encounter: Admitting: Physician Assistant

## 2024-06-26 ENCOUNTER — Encounter: Admitting: Physician Assistant

## 2024-06-30 ENCOUNTER — Encounter: Admitting: Physician Assistant

## 2024-07-01 ENCOUNTER — Encounter: Admitting: Physician Assistant

## 2024-07-02 ENCOUNTER — Encounter: Admitting: Physician Assistant

## 2024-07-03 ENCOUNTER — Encounter: Admitting: Physician Assistant

## 2024-07-07 ENCOUNTER — Encounter: Admitting: Physician Assistant

## 2024-07-08 ENCOUNTER — Encounter: Admitting: Physician Assistant

## 2024-07-09 ENCOUNTER — Encounter: Admitting: Physician Assistant

## 2024-07-10 ENCOUNTER — Encounter: Admitting: Physician Assistant

## 2024-07-14 ENCOUNTER — Encounter: Admitting: Physician Assistant

## 2024-07-15 ENCOUNTER — Encounter: Admitting: Physician Assistant

## 2024-07-16 ENCOUNTER — Encounter: Admitting: Physician Assistant

## 2024-07-17 ENCOUNTER — Encounter: Admitting: Physician Assistant

## 2024-07-21 ENCOUNTER — Encounter: Admitting: Physician Assistant

## 2024-07-22 ENCOUNTER — Encounter: Admitting: Physician Assistant

## 2024-07-23 ENCOUNTER — Encounter: Admitting: Physician Assistant

## 2024-07-24 ENCOUNTER — Encounter: Admitting: Physician Assistant
